# Patient Record
Sex: Female | Born: 1950 | Race: Black or African American | Hispanic: No | Smoking: Never smoker
Health system: Southern US, Community
[De-identification: ages and names within clinical notes are randomized; demographics above are authoritative.]

## PROBLEM LIST (undated history)

## (undated) DIAGNOSIS — E669 Obesity, unspecified: Secondary | ICD-10-CM

## (undated) DIAGNOSIS — I5032 Chronic diastolic (congestive) heart failure: Secondary | ICD-10-CM

## (undated) DIAGNOSIS — M349 Systemic sclerosis, unspecified: Secondary | ICD-10-CM

## (undated) DIAGNOSIS — K219 Gastro-esophageal reflux disease without esophagitis: Secondary | ICD-10-CM

## (undated) DIAGNOSIS — M109 Gout, unspecified: Secondary | ICD-10-CM

## (undated) DIAGNOSIS — D649 Anemia, unspecified: Secondary | ICD-10-CM

## (undated) DIAGNOSIS — E119 Type 2 diabetes mellitus without complications: Secondary | ICD-10-CM

## (undated) DIAGNOSIS — Z9289 Personal history of other medical treatment: Secondary | ICD-10-CM

## (undated) DIAGNOSIS — A599 Trichomoniasis, unspecified: Secondary | ICD-10-CM

## (undated) DIAGNOSIS — M1990 Unspecified osteoarthritis, unspecified site: Secondary | ICD-10-CM

## (undated) DIAGNOSIS — IMO0002 Reserved for concepts with insufficient information to code with codable children: Secondary | ICD-10-CM

## (undated) DIAGNOSIS — K3184 Gastroparesis: Secondary | ICD-10-CM

## (undated) DIAGNOSIS — G629 Polyneuropathy, unspecified: Secondary | ICD-10-CM

## (undated) DIAGNOSIS — E039 Hypothyroidism, unspecified: Secondary | ICD-10-CM

## (undated) DIAGNOSIS — I499 Cardiac arrhythmia, unspecified: Secondary | ICD-10-CM

## (undated) DIAGNOSIS — I480 Paroxysmal atrial fibrillation: Secondary | ICD-10-CM

## (undated) DIAGNOSIS — K5521 Angiodysplasia of colon with hemorrhage: Secondary | ICD-10-CM

## (undated) DIAGNOSIS — N189 Chronic kidney disease, unspecified: Secondary | ICD-10-CM

## (undated) DIAGNOSIS — I10 Essential (primary) hypertension: Secondary | ICD-10-CM

## (undated) DIAGNOSIS — D573 Sickle-cell trait: Secondary | ICD-10-CM

## (undated) HISTORY — PX: COLONOSCOPY: SHX174

## (undated) HISTORY — DX: Essential (primary) hypertension: I10

## (undated) HISTORY — DX: Systemic sclerosis, unspecified: M34.9

## (undated) HISTORY — PX: TUBAL LIGATION: SHX77

## (undated) HISTORY — DX: Unspecified osteoarthritis, unspecified site: M19.90

## (undated) HISTORY — DX: Trichomoniasis, unspecified: A59.9

## (undated) HISTORY — DX: Obesity, unspecified: E66.9

## (undated) HISTORY — DX: Sickle-cell trait: D57.3

## (undated) HISTORY — DX: Reserved for concepts with insufficient information to code with codable children: IMO0002

---
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
## 1981-03-15 HISTORY — PX: ABDOMINAL HYSTERECTOMY: SHX81

## 1995-07-27 ENCOUNTER — Encounter: Payer: Self-pay | Admitting: Gastroenterology

## 1999-02-02 ENCOUNTER — Ambulatory Visit (HOSPITAL_COMMUNITY): Admission: RE | Admit: 1999-02-02 | Discharge: 1999-02-02 | Payer: Self-pay | Admitting: *Deleted

## 1999-03-03 ENCOUNTER — Ambulatory Visit (HOSPITAL_COMMUNITY): Admission: RE | Admit: 1999-03-03 | Discharge: 1999-03-03 | Payer: Self-pay | Admitting: *Deleted

## 1999-04-09 ENCOUNTER — Encounter: Admission: RE | Admit: 1999-04-09 | Discharge: 1999-04-09 | Payer: Self-pay | Admitting: Emergency Medicine

## 1999-04-09 ENCOUNTER — Encounter: Payer: Self-pay | Admitting: Emergency Medicine

## 1999-04-30 ENCOUNTER — Encounter: Admission: RE | Admit: 1999-04-30 | Discharge: 1999-04-30 | Payer: Self-pay | Admitting: Emergency Medicine

## 1999-04-30 ENCOUNTER — Encounter: Payer: Self-pay | Admitting: Emergency Medicine

## 1999-08-19 ENCOUNTER — Encounter: Payer: Self-pay | Admitting: Emergency Medicine

## 1999-08-19 ENCOUNTER — Encounter: Admission: RE | Admit: 1999-08-19 | Discharge: 1999-08-19 | Payer: Self-pay | Admitting: Emergency Medicine

## 2000-07-19 ENCOUNTER — Encounter: Payer: Self-pay | Admitting: Emergency Medicine

## 2000-07-19 ENCOUNTER — Encounter: Admission: RE | Admit: 2000-07-19 | Discharge: 2000-07-19 | Payer: Self-pay | Admitting: Emergency Medicine

## 2000-09-18 ENCOUNTER — Emergency Department (HOSPITAL_COMMUNITY): Admission: EM | Admit: 2000-09-18 | Discharge: 2000-09-18 | Payer: Self-pay | Admitting: Emergency Medicine

## 2000-09-18 ENCOUNTER — Encounter: Payer: Self-pay | Admitting: Emergency Medicine

## 2000-09-26 ENCOUNTER — Ambulatory Visit: Admission: RE | Admit: 2000-09-26 | Discharge: 2000-09-26 | Payer: Self-pay | Admitting: Pulmonary Disease

## 2000-10-07 ENCOUNTER — Encounter: Payer: Self-pay | Admitting: Emergency Medicine

## 2000-10-07 ENCOUNTER — Encounter: Admission: RE | Admit: 2000-10-07 | Discharge: 2000-10-07 | Payer: Self-pay | Admitting: Emergency Medicine

## 2000-10-09 ENCOUNTER — Encounter: Payer: Self-pay | Admitting: Emergency Medicine

## 2000-10-09 ENCOUNTER — Inpatient Hospital Stay (HOSPITAL_COMMUNITY): Admission: EM | Admit: 2000-10-09 | Discharge: 2000-10-14 | Payer: Self-pay | Admitting: *Deleted

## 2000-10-10 ENCOUNTER — Encounter: Payer: Self-pay | Admitting: Emergency Medicine

## 2000-10-11 ENCOUNTER — Encounter: Payer: Self-pay | Admitting: Family Medicine

## 2000-10-12 ENCOUNTER — Encounter: Payer: Self-pay | Admitting: Emergency Medicine

## 2000-10-13 ENCOUNTER — Encounter: Payer: Self-pay | Admitting: Emergency Medicine

## 2000-10-26 ENCOUNTER — Encounter: Payer: Self-pay | Admitting: Nephrology

## 2000-10-26 ENCOUNTER — Encounter: Admission: RE | Admit: 2000-10-26 | Discharge: 2000-10-26 | Payer: Self-pay | Admitting: Nephrology

## 2000-10-27 ENCOUNTER — Ambulatory Visit (HOSPITAL_COMMUNITY): Admission: RE | Admit: 2000-10-27 | Discharge: 2000-10-27 | Payer: Self-pay | Admitting: Pulmonary Disease

## 2000-10-27 ENCOUNTER — Encounter: Payer: Self-pay | Admitting: Pulmonary Disease

## 2000-11-13 HISTORY — PX: REPLACEMENT TOTAL KNEE: SUR1224

## 2000-12-01 ENCOUNTER — Inpatient Hospital Stay (HOSPITAL_COMMUNITY): Admission: RE | Admit: 2000-12-01 | Discharge: 2000-12-06 | Payer: Self-pay | Admitting: Orthopedic Surgery

## 2000-12-01 ENCOUNTER — Encounter: Payer: Self-pay | Admitting: Orthopedic Surgery

## 2001-02-07 ENCOUNTER — Encounter: Admission: RE | Admit: 2001-02-07 | Discharge: 2001-03-22 | Payer: Self-pay | Admitting: Orthopedic Surgery

## 2001-06-01 ENCOUNTER — Ambulatory Visit (HOSPITAL_COMMUNITY): Admission: RE | Admit: 2001-06-01 | Discharge: 2001-06-01 | Payer: Self-pay | Admitting: Family Medicine

## 2001-07-25 ENCOUNTER — Ambulatory Visit (HOSPITAL_COMMUNITY): Admission: RE | Admit: 2001-07-25 | Discharge: 2001-07-25 | Payer: Self-pay | Admitting: Family Medicine

## 2001-09-07 ENCOUNTER — Encounter: Payer: Self-pay | Admitting: Family Medicine

## 2001-09-07 ENCOUNTER — Ambulatory Visit (HOSPITAL_COMMUNITY): Admission: RE | Admit: 2001-09-07 | Discharge: 2001-09-07 | Payer: Self-pay | Admitting: Family Medicine

## 2001-10-23 ENCOUNTER — Encounter: Admission: RE | Admit: 2001-10-23 | Discharge: 2001-11-21 | Payer: Self-pay | Admitting: Family Medicine

## 2002-08-22 ENCOUNTER — Encounter (INDEPENDENT_AMBULATORY_CARE_PROVIDER_SITE_OTHER): Payer: Self-pay | Admitting: *Deleted

## 2002-12-06 ENCOUNTER — Other Ambulatory Visit: Admission: RE | Admit: 2002-12-06 | Discharge: 2002-12-06 | Payer: Self-pay | Admitting: Family Medicine

## 2002-12-12 ENCOUNTER — Ambulatory Visit (HOSPITAL_COMMUNITY): Admission: RE | Admit: 2002-12-12 | Discharge: 2002-12-12 | Payer: Self-pay | Admitting: Family Medicine

## 2003-01-21 ENCOUNTER — Ambulatory Visit (HOSPITAL_COMMUNITY): Admission: RE | Admit: 2003-01-21 | Discharge: 2003-01-21 | Payer: Self-pay | Admitting: Unknown Physician Specialty

## 2003-07-24 ENCOUNTER — Encounter: Admission: RE | Admit: 2003-07-24 | Discharge: 2003-07-24 | Payer: Self-pay | Admitting: *Deleted

## 2003-08-23 ENCOUNTER — Ambulatory Visit (HOSPITAL_COMMUNITY): Admission: RE | Admit: 2003-08-23 | Discharge: 2003-08-23 | Payer: Self-pay | Admitting: *Deleted

## 2003-08-23 ENCOUNTER — Encounter (INDEPENDENT_AMBULATORY_CARE_PROVIDER_SITE_OTHER): Payer: Self-pay | Admitting: *Deleted

## 2003-12-09 ENCOUNTER — Ambulatory Visit: Payer: Self-pay | Admitting: Family Medicine

## 2003-12-16 ENCOUNTER — Ambulatory Visit: Payer: Self-pay | Admitting: *Deleted

## 2004-01-07 ENCOUNTER — Ambulatory Visit: Payer: Self-pay | Admitting: Family Medicine

## 2004-02-25 ENCOUNTER — Ambulatory Visit: Payer: Self-pay | Admitting: Family Medicine

## 2004-03-02 ENCOUNTER — Ambulatory Visit: Payer: Self-pay | Admitting: Family Medicine

## 2004-03-26 ENCOUNTER — Ambulatory Visit: Payer: Self-pay | Admitting: Family Medicine

## 2004-03-30 ENCOUNTER — Encounter (INDEPENDENT_AMBULATORY_CARE_PROVIDER_SITE_OTHER): Payer: Self-pay | Admitting: *Deleted

## 2004-03-30 ENCOUNTER — Ambulatory Visit: Payer: Self-pay | Admitting: Pulmonary Disease

## 2004-04-10 ENCOUNTER — Ambulatory Visit: Payer: Self-pay | Admitting: Family Medicine

## 2004-04-20 ENCOUNTER — Ambulatory Visit: Payer: Self-pay | Admitting: Pulmonary Disease

## 2004-04-24 ENCOUNTER — Ambulatory Visit (HOSPITAL_COMMUNITY): Admission: RE | Admit: 2004-04-24 | Discharge: 2004-04-24 | Payer: Self-pay | Admitting: Cardiology

## 2004-04-24 ENCOUNTER — Ambulatory Visit: Payer: Self-pay | Admitting: Cardiovascular Disease

## 2004-04-24 HISTORY — PX: CARDIAC CATHETERIZATION: SHX172

## 2004-04-28 ENCOUNTER — Ambulatory Visit: Payer: Self-pay | Admitting: Family Medicine

## 2004-05-13 ENCOUNTER — Ambulatory Visit: Payer: Self-pay | Admitting: Pulmonary Disease

## 2004-05-18 ENCOUNTER — Ambulatory Visit: Payer: Self-pay | Admitting: Cardiology

## 2004-05-25 ENCOUNTER — Ambulatory Visit: Payer: Self-pay | Admitting: Cardiology

## 2004-06-01 ENCOUNTER — Ambulatory Visit: Payer: Self-pay | Admitting: *Deleted

## 2004-06-08 ENCOUNTER — Ambulatory Visit: Payer: Self-pay | Admitting: Cardiology

## 2004-06-10 ENCOUNTER — Ambulatory Visit: Payer: Self-pay | Admitting: Pulmonary Disease

## 2004-06-15 ENCOUNTER — Ambulatory Visit: Payer: Self-pay | Admitting: Cardiology

## 2004-06-29 ENCOUNTER — Ambulatory Visit: Payer: Self-pay | Admitting: *Deleted

## 2004-06-30 ENCOUNTER — Ambulatory Visit: Payer: Self-pay | Admitting: Family Medicine

## 2004-07-09 ENCOUNTER — Ambulatory Visit: Payer: Self-pay | Admitting: Family Medicine

## 2004-07-16 ENCOUNTER — Ambulatory Visit: Payer: Self-pay | Admitting: Family Medicine

## 2004-07-23 ENCOUNTER — Ambulatory Visit: Payer: Self-pay | Admitting: Family Medicine

## 2004-07-27 ENCOUNTER — Ambulatory Visit: Payer: Self-pay | Admitting: Pulmonary Disease

## 2004-07-27 ENCOUNTER — Ambulatory Visit: Payer: Self-pay | Admitting: Cardiology

## 2004-08-07 ENCOUNTER — Ambulatory Visit: Payer: Self-pay | Admitting: Family Medicine

## 2004-08-24 ENCOUNTER — Ambulatory Visit: Payer: Self-pay | Admitting: Cardiology

## 2004-08-25 ENCOUNTER — Ambulatory Visit: Payer: Self-pay

## 2004-08-25 ENCOUNTER — Ambulatory Visit: Payer: Self-pay | Admitting: Pulmonary Disease

## 2004-09-08 ENCOUNTER — Ambulatory Visit: Payer: Self-pay | Admitting: Pulmonary Disease

## 2004-09-23 ENCOUNTER — Ambulatory Visit: Payer: Self-pay | Admitting: Pulmonary Disease

## 2004-09-25 ENCOUNTER — Ambulatory Visit: Payer: Self-pay | Admitting: Cardiology

## 2004-10-23 ENCOUNTER — Ambulatory Visit: Payer: Self-pay | Admitting: Cardiology

## 2004-11-19 ENCOUNTER — Ambulatory Visit: Payer: Self-pay | Admitting: Family Medicine

## 2004-11-20 ENCOUNTER — Ambulatory Visit: Payer: Self-pay | Admitting: Cardiology

## 2004-11-25 ENCOUNTER — Ambulatory Visit: Payer: Self-pay | Admitting: Pulmonary Disease

## 2004-11-26 ENCOUNTER — Ambulatory Visit (HOSPITAL_COMMUNITY): Admission: RE | Admit: 2004-11-26 | Discharge: 2004-11-26 | Payer: Self-pay | Admitting: Family Medicine

## 2004-12-09 ENCOUNTER — Ambulatory Visit: Payer: Self-pay

## 2004-12-10 ENCOUNTER — Encounter: Admission: RE | Admit: 2004-12-10 | Discharge: 2005-03-10 | Payer: Self-pay | Admitting: Pulmonary Disease

## 2004-12-18 ENCOUNTER — Ambulatory Visit: Payer: Self-pay | Admitting: Internal Medicine

## 2005-01-13 ENCOUNTER — Ambulatory Visit: Payer: Self-pay | Admitting: Pulmonary Disease

## 2005-01-14 ENCOUNTER — Ambulatory Visit: Payer: Self-pay | Admitting: Cardiology

## 2005-01-18 ENCOUNTER — Ambulatory Visit: Payer: Self-pay | Admitting: Pulmonary Disease

## 2005-02-11 ENCOUNTER — Ambulatory Visit: Payer: Self-pay | Admitting: Cardiology

## 2005-02-16 ENCOUNTER — Ambulatory Visit: Payer: Self-pay | Admitting: Family Medicine

## 2005-02-25 ENCOUNTER — Ambulatory Visit: Payer: Self-pay | Admitting: *Deleted

## 2005-03-18 ENCOUNTER — Encounter: Admission: RE | Admit: 2005-03-18 | Discharge: 2005-06-16 | Payer: Self-pay | Admitting: Pulmonary Disease

## 2005-03-25 ENCOUNTER — Ambulatory Visit: Payer: Self-pay | Admitting: Cardiology

## 2005-04-01 ENCOUNTER — Ambulatory Visit: Payer: Self-pay | Admitting: Family Medicine

## 2005-04-12 ENCOUNTER — Encounter: Admission: RE | Admit: 2005-04-12 | Discharge: 2005-04-12 | Payer: Self-pay

## 2005-04-22 ENCOUNTER — Ambulatory Visit: Payer: Self-pay | Admitting: *Deleted

## 2005-04-22 ENCOUNTER — Encounter: Admission: RE | Admit: 2005-04-22 | Discharge: 2005-04-22 | Payer: Self-pay

## 2005-04-27 ENCOUNTER — Ambulatory Visit: Payer: Self-pay | Admitting: Pulmonary Disease

## 2005-05-03 ENCOUNTER — Ambulatory Visit: Payer: Self-pay | Admitting: Pulmonary Disease

## 2005-05-04 ENCOUNTER — Ambulatory Visit: Payer: Self-pay | Admitting: Family Medicine

## 2005-05-07 ENCOUNTER — Ambulatory Visit: Payer: Self-pay

## 2005-05-07 ENCOUNTER — Encounter: Payer: Self-pay | Admitting: Cardiology

## 2005-05-13 ENCOUNTER — Ambulatory Visit: Payer: Self-pay | Admitting: Cardiology

## 2005-05-14 ENCOUNTER — Ambulatory Visit (HOSPITAL_BASED_OUTPATIENT_CLINIC_OR_DEPARTMENT_OTHER): Admission: RE | Admit: 2005-05-14 | Discharge: 2005-05-14 | Payer: Self-pay | Admitting: Pulmonary Disease

## 2005-05-15 ENCOUNTER — Ambulatory Visit: Payer: Self-pay | Admitting: Pulmonary Disease

## 2005-05-19 ENCOUNTER — Encounter: Admission: RE | Admit: 2005-05-19 | Discharge: 2005-05-19 | Payer: Self-pay

## 2005-05-21 ENCOUNTER — Ambulatory Visit (HOSPITAL_COMMUNITY): Admission: RE | Admit: 2005-05-21 | Discharge: 2005-05-21 | Payer: Self-pay | Admitting: Family Medicine

## 2005-05-21 ENCOUNTER — Ambulatory Visit: Payer: Self-pay | Admitting: Family Medicine

## 2005-06-04 ENCOUNTER — Ambulatory Visit: Payer: Self-pay | Admitting: Pulmonary Disease

## 2005-06-10 ENCOUNTER — Ambulatory Visit: Payer: Self-pay | Admitting: Cardiology

## 2005-06-28 ENCOUNTER — Ambulatory Visit: Payer: Self-pay | Admitting: Family Medicine

## 2005-07-01 ENCOUNTER — Ambulatory Visit: Payer: Self-pay | Admitting: Pulmonary Disease

## 2005-07-01 ENCOUNTER — Ambulatory Visit (HOSPITAL_COMMUNITY): Admission: RE | Admit: 2005-07-01 | Discharge: 2005-07-01 | Payer: Self-pay | Admitting: Family Medicine

## 2005-07-08 ENCOUNTER — Ambulatory Visit: Payer: Self-pay | Admitting: Cardiology

## 2005-07-29 ENCOUNTER — Ambulatory Visit: Payer: Self-pay | Admitting: Family Medicine

## 2005-08-05 ENCOUNTER — Ambulatory Visit: Payer: Self-pay | Admitting: Cardiology

## 2005-08-10 ENCOUNTER — Ambulatory Visit: Payer: Self-pay | Admitting: Pulmonary Disease

## 2005-08-19 ENCOUNTER — Ambulatory Visit: Payer: Self-pay | Admitting: Internal Medicine

## 2005-08-24 ENCOUNTER — Ambulatory Visit: Payer: Self-pay | Admitting: Family Medicine

## 2005-08-27 ENCOUNTER — Ambulatory Visit: Payer: Self-pay | Admitting: Pulmonary Disease

## 2005-09-09 ENCOUNTER — Ambulatory Visit: Payer: Self-pay

## 2005-09-09 ENCOUNTER — Encounter: Payer: Self-pay | Admitting: Cardiology

## 2005-09-09 ENCOUNTER — Ambulatory Visit: Payer: Self-pay | Admitting: Internal Medicine

## 2005-09-30 ENCOUNTER — Ambulatory Visit: Payer: Self-pay | Admitting: Cardiology

## 2005-10-08 ENCOUNTER — Ambulatory Visit: Payer: Self-pay | Admitting: Pulmonary Disease

## 2005-10-28 ENCOUNTER — Ambulatory Visit: Payer: Self-pay | Admitting: Cardiology

## 2005-11-02 ENCOUNTER — Ambulatory Visit: Payer: Self-pay | Admitting: Pulmonary Disease

## 2005-11-11 ENCOUNTER — Ambulatory Visit: Payer: Self-pay | Admitting: Cardiology

## 2005-11-26 ENCOUNTER — Ambulatory Visit: Payer: Self-pay | Admitting: Pulmonary Disease

## 2005-11-26 ENCOUNTER — Ambulatory Visit: Payer: Self-pay | Admitting: Cardiology

## 2005-11-26 ENCOUNTER — Ambulatory Visit: Payer: Self-pay | Admitting: Emergency Medicine

## 2005-12-13 ENCOUNTER — Ambulatory Visit: Payer: Self-pay | Admitting: Cardiology

## 2005-12-24 ENCOUNTER — Ambulatory Visit: Payer: Self-pay | Admitting: Cardiology

## 2006-01-04 ENCOUNTER — Encounter: Admission: RE | Admit: 2006-01-04 | Discharge: 2006-01-04 | Payer: Self-pay | Admitting: *Deleted

## 2006-01-06 ENCOUNTER — Ambulatory Visit (HOSPITAL_COMMUNITY): Admission: RE | Admit: 2006-01-06 | Discharge: 2006-01-06 | Payer: Self-pay | Admitting: *Deleted

## 2006-01-06 ENCOUNTER — Encounter (INDEPENDENT_AMBULATORY_CARE_PROVIDER_SITE_OTHER): Payer: Self-pay | Admitting: Specialist

## 2006-01-06 ENCOUNTER — Encounter: Payer: Self-pay | Admitting: Gastroenterology

## 2006-01-13 ENCOUNTER — Ambulatory Visit: Payer: Self-pay | Admitting: *Deleted

## 2006-01-13 ENCOUNTER — Ambulatory Visit: Payer: Self-pay | Admitting: Pulmonary Disease

## 2006-01-14 ENCOUNTER — Ambulatory Visit: Payer: Self-pay | Admitting: Family Medicine

## 2006-01-15 ENCOUNTER — Emergency Department (HOSPITAL_COMMUNITY): Admission: EM | Admit: 2006-01-15 | Discharge: 2006-01-16 | Payer: Self-pay | Admitting: *Deleted

## 2006-01-19 ENCOUNTER — Ambulatory Visit (HOSPITAL_COMMUNITY): Admission: RE | Admit: 2006-01-19 | Discharge: 2006-01-19 | Payer: Self-pay | Admitting: Family Medicine

## 2006-01-28 ENCOUNTER — Ambulatory Visit: Payer: Self-pay | Admitting: Cardiovascular Disease

## 2006-02-08 ENCOUNTER — Ambulatory Visit: Payer: Self-pay | Admitting: Emergency Medicine

## 2006-02-15 ENCOUNTER — Encounter: Payer: Self-pay | Admitting: Internal Medicine

## 2006-02-15 ENCOUNTER — Ambulatory Visit: Payer: Self-pay

## 2006-02-17 ENCOUNTER — Ambulatory Visit: Payer: Self-pay | Admitting: Pulmonary Disease

## 2006-02-17 ENCOUNTER — Ambulatory Visit: Payer: Self-pay | Admitting: Emergency Medicine

## 2006-02-17 LAB — CONVERTED CEMR LAB
ALT: 34 units/L (ref 0–40)
AST: 34 units/L (ref 0–37)
Albumin: 3.4 g/dL — ABNORMAL LOW (ref 3.5–5.2)
Bilirubin, Direct: 0.1 mg/dL (ref 0.0–0.3)
HCT: 36.4 % (ref 36.0–46.0)
MCHC: 32.4 g/dL (ref 30.0–36.0)
MCV: 89.9 fL (ref 78.0–100.0)
Total Bilirubin: 0.8 mg/dL (ref 0.3–1.2)
WBC: 6.9 10*3/uL (ref 4.5–10.5)

## 2006-02-23 ENCOUNTER — Ambulatory Visit: Payer: Self-pay | Admitting: Emergency Medicine

## 2006-03-10 ENCOUNTER — Ambulatory Visit (HOSPITAL_COMMUNITY): Admission: RE | Admit: 2006-03-10 | Discharge: 2006-03-10 | Payer: Self-pay | Admitting: *Deleted

## 2006-03-10 ENCOUNTER — Encounter: Payer: Self-pay | Admitting: Gastroenterology

## 2006-03-10 ENCOUNTER — Encounter (INDEPENDENT_AMBULATORY_CARE_PROVIDER_SITE_OTHER): Payer: Self-pay | Admitting: *Deleted

## 2006-04-01 ENCOUNTER — Ambulatory Visit: Payer: Self-pay | Admitting: Family Medicine

## 2006-04-04 ENCOUNTER — Ambulatory Visit: Payer: Self-pay | Admitting: Emergency Medicine

## 2006-04-04 ENCOUNTER — Inpatient Hospital Stay (HOSPITAL_COMMUNITY): Admission: AD | Admit: 2006-04-04 | Discharge: 2006-04-08 | Payer: Self-pay | Admitting: Emergency Medicine

## 2006-04-04 LAB — CONVERTED CEMR LAB
AST: 29 units/L (ref 0–37)
Albumin: 3.3 g/dL — ABNORMAL LOW (ref 3.5–5.2)
BUN: 17 mg/dL (ref 6–23)
CO2: 24 meq/L (ref 19–32)
Chloride: 94 meq/L — ABNORMAL LOW (ref 96–112)
Creatinine, Ser: 1.3 mg/dL — ABNORMAL HIGH (ref 0.4–1.2)
GFR calc Af Amer: 55 mL/min
GFR calc non Af Amer: 45 mL/min
Total Bilirubin: 1.5 mg/dL — ABNORMAL HIGH (ref 0.3–1.2)

## 2006-04-12 ENCOUNTER — Ambulatory Visit: Payer: Self-pay | Admitting: Cardiology

## 2006-04-13 ENCOUNTER — Ambulatory Visit: Payer: Self-pay | Admitting: Family Medicine

## 2006-04-19 ENCOUNTER — Encounter: Admission: RE | Admit: 2006-04-19 | Discharge: 2006-07-18 | Payer: Self-pay | Admitting: Emergency Medicine

## 2006-04-21 ENCOUNTER — Ambulatory Visit: Payer: Self-pay | Admitting: Emergency Medicine

## 2006-04-22 ENCOUNTER — Ambulatory Visit: Payer: Self-pay | Admitting: *Deleted

## 2006-05-13 ENCOUNTER — Ambulatory Visit: Payer: Self-pay | Admitting: Family Medicine

## 2006-05-17 ENCOUNTER — Ambulatory Visit (HOSPITAL_COMMUNITY): Admission: RE | Admit: 2006-05-17 | Discharge: 2006-05-17 | Payer: Self-pay | Admitting: Orthopedic Surgery

## 2006-05-17 ENCOUNTER — Ambulatory Visit: Payer: Self-pay | Admitting: Vascular Surgery

## 2006-05-17 ENCOUNTER — Ambulatory Visit: Payer: Self-pay | Admitting: Cardiology

## 2006-05-23 ENCOUNTER — Ambulatory Visit: Payer: Self-pay | Admitting: Emergency Medicine

## 2006-05-23 LAB — CONVERTED CEMR LAB
AST: 21 units/L (ref 0–37)
Basophils Relative: 2.4 % — ABNORMAL HIGH (ref 0.0–1.0)
Bilirubin, Direct: 0.1 mg/dL (ref 0.0–0.3)
Eosinophils Relative: 1.3 % (ref 0.0–5.0)
HCT: 36.1 % (ref 36.0–46.0)
Hemoglobin: 12 g/dL (ref 12.0–15.0)
Lymphocytes Relative: 30.8 % (ref 12.0–46.0)
Monocytes Absolute: 0.7 10*3/uL (ref 0.2–0.7)
Monocytes Relative: 9 % (ref 3.0–11.0)
Neutro Abs: 4.4 10*3/uL (ref 1.4–7.7)
Neutrophils Relative %: 56.5 % (ref 43.0–77.0)
RDW: 12.7 % (ref 11.5–14.6)
Total Bilirubin: 0.5 mg/dL (ref 0.3–1.2)
Total Protein: 6.8 g/dL (ref 6.0–8.3)

## 2006-06-07 ENCOUNTER — Ambulatory Visit: Payer: Self-pay | Admitting: Cardiology

## 2006-06-13 ENCOUNTER — Encounter: Admission: RE | Admit: 2006-06-13 | Discharge: 2006-06-13 | Payer: Self-pay | Admitting: Orthopedic Surgery

## 2006-07-05 ENCOUNTER — Ambulatory Visit: Payer: Self-pay | Admitting: Cardiology

## 2006-07-12 ENCOUNTER — Ambulatory Visit: Payer: Self-pay | Admitting: Family Medicine

## 2006-07-26 ENCOUNTER — Ambulatory Visit: Payer: Self-pay | Admitting: Emergency Medicine

## 2006-08-02 ENCOUNTER — Ambulatory Visit: Payer: Self-pay | Admitting: Emergency Medicine

## 2006-08-02 ENCOUNTER — Ambulatory Visit: Payer: Self-pay | Admitting: Cardiology

## 2006-08-02 LAB — CONVERTED CEMR LAB
Albumin: 3.6 g/dL (ref 3.5–5.2)
Alkaline Phosphatase: 59 units/L (ref 39–117)
Total Bilirubin: 0.7 mg/dL (ref 0.3–1.2)

## 2006-08-05 ENCOUNTER — Ambulatory Visit: Payer: Self-pay | Admitting: Emergency Medicine

## 2006-08-09 DIAGNOSIS — M349 Systemic sclerosis, unspecified: Secondary | ICD-10-CM

## 2006-08-09 DIAGNOSIS — I10 Essential (primary) hypertension: Secondary | ICD-10-CM

## 2006-08-09 DIAGNOSIS — K219 Gastro-esophageal reflux disease without esophagitis: Secondary | ICD-10-CM

## 2006-08-09 DIAGNOSIS — M5137 Other intervertebral disc degeneration, lumbosacral region: Secondary | ICD-10-CM

## 2006-08-09 DIAGNOSIS — E1142 Type 2 diabetes mellitus with diabetic polyneuropathy: Secondary | ICD-10-CM

## 2006-08-15 ENCOUNTER — Encounter: Payer: Self-pay | Admitting: Emergency Medicine

## 2006-08-15 ENCOUNTER — Ambulatory Visit: Payer: Self-pay

## 2006-08-19 ENCOUNTER — Ambulatory Visit: Payer: Self-pay | Admitting: Cardiology

## 2006-08-23 ENCOUNTER — Encounter: Admission: RE | Admit: 2006-08-23 | Discharge: 2006-08-23 | Payer: Self-pay | Admitting: Emergency Medicine

## 2006-09-05 ENCOUNTER — Ambulatory Visit: Payer: Self-pay | Admitting: Emergency Medicine

## 2006-10-25 ENCOUNTER — Ambulatory Visit: Payer: Self-pay | Admitting: Cardiology

## 2006-11-04 ENCOUNTER — Ambulatory Visit: Payer: Self-pay | Admitting: Emergency Medicine

## 2006-11-04 LAB — CONVERTED CEMR LAB
ALT: 19 units/L (ref 0–35)
Albumin: 3.5 g/dL (ref 3.5–5.2)
Alkaline Phosphatase: 64 units/L (ref 39–117)
Total Bilirubin: 0.6 mg/dL (ref 0.3–1.2)
Total Protein: 7.5 g/dL (ref 6.0–8.3)

## 2006-11-10 ENCOUNTER — Ambulatory Visit: Payer: Self-pay

## 2006-11-22 ENCOUNTER — Ambulatory Visit: Payer: Self-pay | Admitting: Cardiology

## 2006-11-24 ENCOUNTER — Encounter: Admission: RE | Admit: 2006-11-24 | Discharge: 2006-11-24 | Payer: Self-pay | Admitting: Emergency Medicine

## 2006-12-07 ENCOUNTER — Ambulatory Visit: Payer: Self-pay | Admitting: Emergency Medicine

## 2006-12-07 LAB — CONVERTED CEMR LAB
Alkaline Phosphatase: 65 units/L (ref 39–117)
Bilirubin, Direct: 0.1 mg/dL (ref 0.0–0.3)
Total Bilirubin: 0.6 mg/dL (ref 0.3–1.2)
Total Protein: 7.4 g/dL (ref 6.0–8.3)

## 2006-12-13 ENCOUNTER — Ambulatory Visit: Payer: Self-pay | Admitting: Family Medicine

## 2006-12-22 ENCOUNTER — Ambulatory Visit: Payer: Self-pay | Admitting: Cardiology

## 2006-12-22 LAB — CONVERTED CEMR LAB
ALT: 22 units/L (ref 0–35)
Alkaline Phosphatase: 52 units/L (ref 39–117)
Bilirubin, Direct: 0.1 mg/dL (ref 0.0–0.3)
Total Protein: 7.3 g/dL (ref 6.0–8.3)

## 2006-12-30 ENCOUNTER — Ambulatory Visit: Payer: Self-pay | Admitting: Emergency Medicine

## 2006-12-31 DIAGNOSIS — IMO0002 Reserved for concepts with insufficient information to code with codable children: Secondary | ICD-10-CM | POA: Insufficient documentation

## 2006-12-31 DIAGNOSIS — R059 Cough, unspecified: Secondary | ICD-10-CM | POA: Insufficient documentation

## 2006-12-31 DIAGNOSIS — R05 Cough: Secondary | ICD-10-CM

## 2006-12-31 DIAGNOSIS — J309 Allergic rhinitis, unspecified: Secondary | ICD-10-CM | POA: Insufficient documentation

## 2007-01-13 ENCOUNTER — Ambulatory Visit: Payer: Self-pay | Admitting: Emergency Medicine

## 2007-01-19 ENCOUNTER — Ambulatory Visit: Payer: Self-pay | Admitting: Cardiology

## 2007-01-19 LAB — CONVERTED CEMR LAB
ALT: 22 units/L (ref 0–35)
AST: 26 units/L (ref 0–37)
Alkaline Phosphatase: 51 units/L (ref 39–117)
Total Protein: 7.3 g/dL (ref 6.0–8.3)

## 2007-02-01 ENCOUNTER — Telehealth (INDEPENDENT_AMBULATORY_CARE_PROVIDER_SITE_OTHER): Payer: Self-pay | Admitting: *Deleted

## 2007-02-07 ENCOUNTER — Ambulatory Visit: Payer: Self-pay | Admitting: Emergency Medicine

## 2007-02-17 ENCOUNTER — Ambulatory Visit: Payer: Self-pay | Admitting: Cardiology

## 2007-02-17 LAB — CONVERTED CEMR LAB
ALT: 23 units/L (ref 0–35)
AST: 25 units/L (ref 0–37)
Bilirubin, Direct: 0.1 mg/dL (ref 0.0–0.3)
Total Protein: 7 g/dL (ref 6.0–8.3)

## 2007-02-20 ENCOUNTER — Telehealth (INDEPENDENT_AMBULATORY_CARE_PROVIDER_SITE_OTHER): Payer: Self-pay | Admitting: *Deleted

## 2007-02-22 ENCOUNTER — Ambulatory Visit (HOSPITAL_COMMUNITY): Admission: RE | Admit: 2007-02-22 | Discharge: 2007-02-22 | Payer: Self-pay | Admitting: Family Medicine

## 2007-02-24 ENCOUNTER — Encounter: Admission: RE | Admit: 2007-02-24 | Discharge: 2007-02-25 | Payer: Self-pay | Admitting: Emergency Medicine

## 2007-03-20 ENCOUNTER — Ambulatory Visit: Payer: Self-pay | Admitting: Cardiology

## 2007-03-20 LAB — CONVERTED CEMR LAB
Albumin: 3.5 g/dL (ref 3.5–5.2)
Bilirubin, Direct: 0.1 mg/dL (ref 0.0–0.3)

## 2007-03-22 ENCOUNTER — Telehealth (INDEPENDENT_AMBULATORY_CARE_PROVIDER_SITE_OTHER): Payer: Self-pay | Admitting: *Deleted

## 2007-04-07 ENCOUNTER — Ambulatory Visit: Payer: Self-pay | Admitting: Internal Medicine

## 2007-04-07 LAB — CONVERTED CEMR LAB
BUN: 20 mg/dL (ref 6–23)
CO2: 23 meq/L (ref 19–32)
Chloride: 100 meq/L (ref 96–112)
Creatinine, Ser: 1 mg/dL (ref 0.4–1.2)
Digitoxin Lvl: 0.2 ng/mL — ABNORMAL LOW (ref 0.8–2.0)
Direct LDL: 43 mg/dL
Magnesium: 0.9 mg/dL — CL (ref 1.5–2.5)
TSH: 1.03 microintl units/mL (ref 0.35–5.50)

## 2007-04-17 ENCOUNTER — Ambulatory Visit: Payer: Self-pay | Admitting: Internal Medicine

## 2007-04-17 LAB — CONVERTED CEMR LAB: Albumin: 3.4 g/dL — ABNORMAL LOW (ref 3.5–5.2)

## 2007-04-21 ENCOUNTER — Ambulatory Visit: Payer: Self-pay | Admitting: Internal Medicine

## 2007-04-21 LAB — CONVERTED CEMR LAB
BUN: 21 mg/dL (ref 6–23)
CO2: 23 meq/L (ref 19–32)
Calcium: 9.7 mg/dL (ref 8.4–10.5)
Chloride: 103 meq/L (ref 96–112)
Creatinine, Ser: 1 mg/dL (ref 0.4–1.2)
GFR calc Af Amer: 74 mL/min

## 2007-04-24 ENCOUNTER — Ambulatory Visit: Payer: Self-pay | Admitting: Family Medicine

## 2007-04-24 LAB — CONVERTED CEMR LAB
BUN: 27 mg/dL — ABNORMAL HIGH (ref 6–23)
CO2: 21 meq/L (ref 19–32)
Chloride: 104 meq/L (ref 96–112)
LDL Cholesterol: 14 mg/dL (ref 0–99)
Potassium: 4.5 meq/L (ref 3.5–5.3)
Triglycerides: 345 mg/dL — ABNORMAL HIGH (ref <150)
VLDL: 69 mg/dL — ABNORMAL HIGH (ref 0–40)

## 2007-05-17 ENCOUNTER — Ambulatory Visit: Payer: Self-pay | Admitting: Emergency Medicine

## 2007-06-06 ENCOUNTER — Ambulatory Visit: Payer: Self-pay | Admitting: Family Medicine

## 2007-06-09 ENCOUNTER — Telehealth (INDEPENDENT_AMBULATORY_CARE_PROVIDER_SITE_OTHER): Payer: Self-pay | Admitting: *Deleted

## 2007-06-09 ENCOUNTER — Ambulatory Visit: Payer: Self-pay | Admitting: Emergency Medicine

## 2007-06-09 DIAGNOSIS — J209 Acute bronchitis, unspecified: Secondary | ICD-10-CM | POA: Insufficient documentation

## 2007-06-15 ENCOUNTER — Telehealth (INDEPENDENT_AMBULATORY_CARE_PROVIDER_SITE_OTHER): Payer: Self-pay

## 2007-06-16 ENCOUNTER — Ambulatory Visit: Payer: Self-pay | Admitting: Emergency Medicine

## 2007-06-19 ENCOUNTER — Ambulatory Visit: Payer: Self-pay | Admitting: Family Medicine

## 2007-06-19 LAB — CONVERTED CEMR LAB
LDL Cholesterol: 21 mg/dL (ref 0–99)
Triglycerides: 254 mg/dL — ABNORMAL HIGH (ref <150)

## 2007-06-21 ENCOUNTER — Encounter (INDEPENDENT_AMBULATORY_CARE_PROVIDER_SITE_OTHER): Payer: Self-pay | Admitting: *Deleted

## 2007-06-22 ENCOUNTER — Ambulatory Visit: Payer: Self-pay | Admitting: Emergency Medicine

## 2007-07-04 ENCOUNTER — Encounter: Admission: RE | Admit: 2007-07-04 | Discharge: 2007-07-04 | Payer: Self-pay | Admitting: Emergency Medicine

## 2007-07-04 ENCOUNTER — Ambulatory Visit: Payer: Self-pay | Admitting: Cardiology

## 2007-07-04 LAB — CONVERTED CEMR LAB
ALT: 24 units/L (ref 0–35)
AST: 28 units/L (ref 0–37)
Albumin: 3.5 g/dL (ref 3.5–5.2)
Alkaline Phosphatase: 79 units/L (ref 39–117)
Bilirubin, Direct: 0.1 mg/dL (ref 0.0–0.3)
Total Bilirubin: 0.6 mg/dL (ref 0.3–1.2)
Total Protein: 7.7 g/dL (ref 6.0–8.3)

## 2007-07-07 ENCOUNTER — Encounter: Payer: Self-pay | Admitting: Emergency Medicine

## 2007-08-01 ENCOUNTER — Ambulatory Visit: Payer: Self-pay | Admitting: Cardiology

## 2007-08-01 LAB — CONVERTED CEMR LAB
ALT: 22 units/L (ref 0–35)
AST: 24 units/L (ref 0–37)
Albumin: 3.5 g/dL (ref 3.5–5.2)

## 2007-08-08 ENCOUNTER — Telehealth (INDEPENDENT_AMBULATORY_CARE_PROVIDER_SITE_OTHER): Payer: Self-pay | Admitting: *Deleted

## 2007-08-09 ENCOUNTER — Ambulatory Visit: Payer: Self-pay | Admitting: Emergency Medicine

## 2007-08-14 ENCOUNTER — Telehealth (INDEPENDENT_AMBULATORY_CARE_PROVIDER_SITE_OTHER): Payer: Self-pay | Admitting: *Deleted

## 2007-08-29 ENCOUNTER — Encounter: Payer: Self-pay | Admitting: Emergency Medicine

## 2007-08-29 ENCOUNTER — Ambulatory Visit: Payer: Self-pay | Admitting: Cardiology

## 2007-08-29 LAB — CONVERTED CEMR LAB: Total Bilirubin: 0.6 mg/dL (ref 0.3–1.2)

## 2007-09-25 ENCOUNTER — Ambulatory Visit: Payer: Self-pay

## 2007-09-25 ENCOUNTER — Encounter: Payer: Self-pay | Admitting: Emergency Medicine

## 2007-09-25 ENCOUNTER — Ambulatory Visit: Payer: Self-pay | Admitting: Emergency Medicine

## 2007-09-25 DIAGNOSIS — J9620 Acute and chronic respiratory failure, unspecified whether with hypoxia or hypercapnia: Secondary | ICD-10-CM

## 2007-09-26 ENCOUNTER — Ambulatory Visit: Payer: Self-pay | Admitting: Internal Medicine

## 2007-09-26 ENCOUNTER — Encounter: Payer: Self-pay | Admitting: Emergency Medicine

## 2007-09-26 LAB — CONVERTED CEMR LAB
ALT: 22 units/L (ref 0–35)
AST: 30 units/L (ref 0–37)
Alkaline Phosphatase: 70 units/L (ref 39–117)
Bilirubin, Direct: 0.1 mg/dL (ref 0.0–0.3)
Total Bilirubin: 0.8 mg/dL (ref 0.3–1.2)

## 2007-09-28 ENCOUNTER — Ambulatory Visit: Payer: Self-pay | Admitting: Internal Medicine

## 2007-09-28 ENCOUNTER — Encounter (INDEPENDENT_AMBULATORY_CARE_PROVIDER_SITE_OTHER): Payer: Self-pay | Admitting: Family Medicine

## 2007-10-02 ENCOUNTER — Ambulatory Visit (HOSPITAL_COMMUNITY): Admission: RE | Admit: 2007-10-02 | Discharge: 2007-10-02 | Payer: Self-pay | Admitting: Family Medicine

## 2007-10-09 ENCOUNTER — Ambulatory Visit: Payer: Self-pay | Admitting: Cardiology

## 2007-10-09 ENCOUNTER — Ambulatory Visit: Payer: Self-pay | Admitting: Emergency Medicine

## 2007-11-01 ENCOUNTER — Ambulatory Visit (HOSPITAL_COMMUNITY): Admission: RE | Admit: 2007-11-01 | Discharge: 2007-11-01 | Payer: Self-pay | Admitting: Family Medicine

## 2007-11-02 ENCOUNTER — Ambulatory Visit: Payer: Self-pay | Admitting: Internal Medicine

## 2007-11-02 LAB — CONVERTED CEMR LAB
ALT: 20 units/L (ref 0–35)
Alkaline Phosphatase: 70 units/L (ref 39–117)
Bilirubin, Direct: 0.1 mg/dL (ref 0.0–0.3)
Total Protein: 7.3 g/dL (ref 6.0–8.3)

## 2007-11-17 ENCOUNTER — Telehealth (INDEPENDENT_AMBULATORY_CARE_PROVIDER_SITE_OTHER): Payer: Self-pay | Admitting: *Deleted

## 2007-11-30 ENCOUNTER — Ambulatory Visit: Payer: Self-pay | Admitting: Cardiology

## 2007-11-30 LAB — CONVERTED CEMR LAB
Albumin: 3.6 g/dL (ref 3.5–5.2)
Total Protein: 7.4 g/dL (ref 6.0–8.3)

## 2007-12-28 ENCOUNTER — Ambulatory Visit: Payer: Self-pay | Admitting: Emergency Medicine

## 2008-01-17 ENCOUNTER — Telehealth: Payer: Self-pay | Admitting: Emergency Medicine

## 2008-02-05 ENCOUNTER — Ambulatory Visit: Payer: Self-pay | Admitting: Emergency Medicine

## 2008-02-07 ENCOUNTER — Telehealth: Payer: Self-pay | Admitting: Emergency Medicine

## 2008-02-07 ENCOUNTER — Encounter: Payer: Self-pay | Admitting: Emergency Medicine

## 2008-02-14 ENCOUNTER — Ambulatory Visit: Payer: Self-pay | Admitting: Internal Medicine

## 2008-02-14 LAB — CONVERTED CEMR LAB
Albumin: 3.3 g/dL — ABNORMAL LOW (ref 3.5–5.2)
Alkaline Phosphatase: 66 units/L (ref 39–117)
Bilirubin, Direct: 0.1 mg/dL (ref 0.0–0.3)

## 2008-02-29 ENCOUNTER — Ambulatory Visit: Payer: Self-pay | Admitting: Emergency Medicine

## 2008-03-04 ENCOUNTER — Encounter: Admission: RE | Admit: 2008-03-04 | Discharge: 2008-03-04 | Payer: Self-pay | Admitting: *Deleted

## 2008-03-18 ENCOUNTER — Ambulatory Visit: Payer: Self-pay | Admitting: Internal Medicine

## 2008-03-18 LAB — CONVERTED CEMR LAB
AST: 30 units/L (ref 0–37)
Albumin: 3.4 g/dL — ABNORMAL LOW (ref 3.5–5.2)
Alkaline Phosphatase: 73 units/L (ref 39–117)
Bilirubin, Direct: 0.1 mg/dL (ref 0.0–0.3)
Total Protein: 6.9 g/dL (ref 6.0–8.3)

## 2008-03-27 ENCOUNTER — Ambulatory Visit: Payer: Self-pay | Admitting: Emergency Medicine

## 2008-04-05 ENCOUNTER — Telehealth (INDEPENDENT_AMBULATORY_CARE_PROVIDER_SITE_OTHER): Payer: Self-pay | Admitting: *Deleted

## 2008-04-09 ENCOUNTER — Telehealth: Payer: Self-pay | Admitting: Emergency Medicine

## 2008-04-11 ENCOUNTER — Encounter: Payer: Self-pay | Admitting: Emergency Medicine

## 2008-04-16 ENCOUNTER — Encounter: Payer: Self-pay | Admitting: Gastroenterology

## 2008-04-23 ENCOUNTER — Ambulatory Visit (HOSPITAL_COMMUNITY): Admission: RE | Admit: 2008-04-23 | Discharge: 2008-04-23 | Payer: Self-pay | Admitting: Family Medicine

## 2008-04-25 ENCOUNTER — Ambulatory Visit: Payer: Self-pay | Admitting: Cardiovascular Disease

## 2008-05-09 LAB — CONVERTED CEMR LAB
AST: 29 units/L (ref 0–37)
Albumin: 3.3 g/dL — ABNORMAL LOW (ref 3.5–5.2)
Total Protein: 6.7 g/dL (ref 6.0–8.3)

## 2008-05-10 ENCOUNTER — Ambulatory Visit: Payer: Self-pay | Admitting: Internal Medicine

## 2008-05-14 ENCOUNTER — Ambulatory Visit: Payer: Self-pay | Admitting: Emergency Medicine

## 2008-05-23 ENCOUNTER — Ambulatory Visit: Payer: Self-pay | Admitting: Cardiovascular Disease

## 2008-05-23 LAB — CONVERTED CEMR LAB
AST: 29 units/L (ref 0–37)
Total Bilirubin: 0.8 mg/dL (ref 0.3–1.2)

## 2008-06-06 ENCOUNTER — Encounter: Payer: Self-pay | Admitting: Emergency Medicine

## 2008-06-07 ENCOUNTER — Telehealth: Payer: Self-pay | Admitting: Emergency Medicine

## 2008-06-18 ENCOUNTER — Encounter: Payer: Self-pay | Admitting: Emergency Medicine

## 2008-06-19 ENCOUNTER — Telehealth (INDEPENDENT_AMBULATORY_CARE_PROVIDER_SITE_OTHER): Payer: Self-pay | Admitting: *Deleted

## 2008-06-20 ENCOUNTER — Ambulatory Visit: Payer: Self-pay | Admitting: Internal Medicine

## 2008-06-20 LAB — CONVERTED CEMR LAB
ALT: 21 units/L (ref 0–35)
AST: 26 units/L (ref 0–37)
Alkaline Phosphatase: 78 units/L (ref 39–117)
Bilirubin, Direct: 0 mg/dL (ref 0.0–0.3)
Total Bilirubin: 0.6 mg/dL (ref 0.3–1.2)

## 2008-06-27 ENCOUNTER — Ambulatory Visit: Payer: Self-pay | Admitting: Emergency Medicine

## 2008-07-04 ENCOUNTER — Encounter: Payer: Self-pay | Admitting: Gastroenterology

## 2008-07-05 ENCOUNTER — Encounter: Payer: Self-pay | Admitting: Emergency Medicine

## 2008-07-15 ENCOUNTER — Encounter: Payer: Self-pay | Admitting: Emergency Medicine

## 2008-07-18 ENCOUNTER — Ambulatory Visit: Payer: Self-pay | Admitting: Internal Medicine

## 2008-07-19 LAB — CONVERTED CEMR LAB
ALT: 19 units/L (ref 0–35)
Albumin: 3.4 g/dL — ABNORMAL LOW (ref 3.5–5.2)
Total Bilirubin: 0.7 mg/dL (ref 0.3–1.2)
Total Protein: 7.4 g/dL (ref 6.0–8.3)

## 2008-08-13 ENCOUNTER — Encounter: Payer: Self-pay | Admitting: *Deleted

## 2008-08-15 ENCOUNTER — Ambulatory Visit: Payer: Self-pay | Admitting: Family Medicine

## 2008-08-15 LAB — CONVERTED CEMR LAB: Microalb, Ur: 0.51 mg/dL (ref 0.00–1.89)

## 2008-08-19 ENCOUNTER — Ambulatory Visit (HOSPITAL_COMMUNITY): Admission: RE | Admit: 2008-08-19 | Discharge: 2008-08-19 | Payer: Self-pay | Admitting: Family Medicine

## 2008-08-20 ENCOUNTER — Ambulatory Visit: Payer: Self-pay | Admitting: Cardiology

## 2008-08-20 DIAGNOSIS — E781 Pure hyperglyceridemia: Secondary | ICD-10-CM

## 2008-08-20 DIAGNOSIS — I509 Heart failure, unspecified: Secondary | ICD-10-CM | POA: Insufficient documentation

## 2008-08-20 DIAGNOSIS — T7841XA Arthus phenomenon, initial encounter: Secondary | ICD-10-CM

## 2008-08-20 LAB — CONVERTED CEMR LAB
ALT: 18 units/L (ref 0–35)
AST: 23 units/L (ref 0–37)
BUN: 16 mg/dL (ref 6–23)
Basophils Absolute: 0 10*3/uL (ref 0.0–0.1)
Bilirubin, Direct: 0.1 mg/dL (ref 0.0–0.3)
Calcium: 8.1 mg/dL — ABNORMAL LOW (ref 8.4–10.5)
Creatinine, Ser: 0.7 mg/dL (ref 0.4–1.2)
Eosinophils Relative: 1.9 % (ref 0.0–5.0)
Free T4: 0.7 ng/dL (ref 0.6–1.6)
GFR calc non Af Amer: 110.96 mL/min (ref >60)
Glucose, Bld: 77 mg/dL (ref 70–99)
Monocytes Relative: 13.2 % — ABNORMAL HIGH (ref 3.0–12.0)
Neutrophils Relative %: 48.3 % (ref 43.0–77.0)
POC INR: 2.4
Platelets: 189 10*3/uL (ref 150.0–400.0)
RDW: 12.9 % (ref 11.5–14.6)
TSH: 1.24 microintl units/mL (ref 0.35–5.50)
Total Bilirubin: 0.8 mg/dL (ref 0.3–1.2)
WBC: 7.5 10*3/uL (ref 4.5–10.5)

## 2008-08-21 LAB — CONVERTED CEMR LAB
Direct LDL: 34.8 mg/dL
HDL: 30.7 mg/dL — ABNORMAL LOW (ref >39.00)
Triglycerides: 463 mg/dL — ABNORMAL HIGH (ref 0.0–149.0)
VLDL: 92.6 mg/dL — ABNORMAL HIGH (ref 0.0–40.0)

## 2008-08-28 ENCOUNTER — Ambulatory Visit: Payer: Self-pay | Admitting: Emergency Medicine

## 2008-09-18 ENCOUNTER — Encounter: Payer: Self-pay | Admitting: *Deleted

## 2008-09-19 ENCOUNTER — Ambulatory Visit: Payer: Self-pay | Admitting: Internal Medicine

## 2008-09-19 LAB — CONVERTED CEMR LAB: POC INR: 2.6

## 2008-10-07 LAB — CONVERTED CEMR LAB
ALT: 25 units/L (ref 0–35)
Albumin: 3.2 g/dL — ABNORMAL LOW (ref 3.5–5.2)
Alkaline Phosphatase: 68 units/L (ref 39–117)
Bilirubin, Direct: 0.1 mg/dL (ref 0.0–0.3)
Total Protein: 6.9 g/dL (ref 6.0–8.3)

## 2008-10-10 ENCOUNTER — Ambulatory Visit (HOSPITAL_COMMUNITY): Admission: RE | Admit: 2008-10-10 | Discharge: 2008-10-10 | Payer: Self-pay | Admitting: *Deleted

## 2008-10-15 ENCOUNTER — Encounter: Payer: Self-pay | Admitting: Emergency Medicine

## 2008-10-17 ENCOUNTER — Ambulatory Visit: Payer: Self-pay | Admitting: Cardiology

## 2008-10-17 ENCOUNTER — Encounter: Payer: Self-pay | Admitting: Emergency Medicine

## 2008-10-17 DIAGNOSIS — I119 Hypertensive heart disease without heart failure: Secondary | ICD-10-CM | POA: Insufficient documentation

## 2008-10-18 LAB — CONVERTED CEMR LAB
ALT: 33 units/L (ref 0–35)
AST: 38 units/L — ABNORMAL HIGH (ref 0–37)
Albumin: 3.7 g/dL (ref 3.5–5.2)
Total Bilirubin: 0.8 mg/dL (ref 0.3–1.2)

## 2008-10-25 ENCOUNTER — Ambulatory Visit: Payer: Self-pay | Admitting: Emergency Medicine

## 2008-11-14 ENCOUNTER — Ambulatory Visit: Payer: Self-pay | Admitting: Family Medicine

## 2008-11-19 ENCOUNTER — Ambulatory Visit: Payer: Self-pay | Admitting: Cardiology

## 2008-11-19 LAB — CONVERTED CEMR LAB: POC INR: 2.3

## 2008-11-20 ENCOUNTER — Ambulatory Visit: Payer: Self-pay

## 2008-11-20 ENCOUNTER — Encounter: Payer: Self-pay | Admitting: Emergency Medicine

## 2008-11-23 LAB — CONVERTED CEMR LAB
Albumin: 3.3 g/dL — ABNORMAL LOW (ref 3.5–5.2)
Total Bilirubin: 1.1 mg/dL (ref 0.3–1.2)

## 2008-11-26 ENCOUNTER — Telehealth (INDEPENDENT_AMBULATORY_CARE_PROVIDER_SITE_OTHER): Payer: Self-pay | Admitting: *Deleted

## 2008-11-28 ENCOUNTER — Telehealth: Payer: Self-pay | Admitting: Emergency Medicine

## 2008-12-09 ENCOUNTER — Ambulatory Visit: Payer: Self-pay | Admitting: Emergency Medicine

## 2008-12-17 ENCOUNTER — Ambulatory Visit: Payer: Self-pay | Admitting: Cardiology

## 2008-12-17 LAB — CONVERTED CEMR LAB: POC INR: 2.2

## 2008-12-18 ENCOUNTER — Encounter: Payer: Self-pay | Admitting: Emergency Medicine

## 2008-12-18 LAB — CONVERTED CEMR LAB
AST: 27 units/L (ref 0–37)
Albumin: 3.5 g/dL (ref 3.5–5.2)
Alkaline Phosphatase: 79 units/L (ref 39–117)
Bilirubin, Direct: 0.1 mg/dL (ref 0.0–0.3)
Total Bilirubin: 0.6 mg/dL (ref 0.3–1.2)

## 2008-12-26 ENCOUNTER — Telehealth (INDEPENDENT_AMBULATORY_CARE_PROVIDER_SITE_OTHER): Payer: Self-pay | Admitting: *Deleted

## 2008-12-27 ENCOUNTER — Ambulatory Visit: Payer: Self-pay | Admitting: Family Medicine

## 2009-01-17 ENCOUNTER — Ambulatory Visit: Payer: Self-pay | Admitting: Emergency Medicine

## 2009-01-17 ENCOUNTER — Ambulatory Visit: Payer: Self-pay | Admitting: Cardiovascular Disease

## 2009-01-27 LAB — CONVERTED CEMR LAB
ALT: 16 units/L (ref 0–35)
AST: 21 units/L (ref 0–37)
Albumin: 3.3 g/dL — ABNORMAL LOW (ref 3.5–5.2)
Alkaline Phosphatase: 67 units/L (ref 39–117)
Bilirubin, Direct: 0 mg/dL (ref 0.0–0.3)
Total Protein: 7.3 g/dL (ref 6.0–8.3)

## 2009-01-28 ENCOUNTER — Ambulatory Visit: Payer: Self-pay | Admitting: Emergency Medicine

## 2009-02-10 ENCOUNTER — Ambulatory Visit: Payer: Self-pay | Admitting: Emergency Medicine

## 2009-02-13 ENCOUNTER — Ambulatory Visit: Payer: Self-pay | Admitting: Family Medicine

## 2009-02-13 LAB — CONVERTED CEMR LAB
BUN: 26 mg/dL — ABNORMAL HIGH
CO2: 21 meq/L
Calcium: 8.5 mg/dL
Chloride: 104 meq/L
Creatinine, Ser: 0.86 mg/dL
Glucose, Bld: 63 mg/dL — ABNORMAL LOW
Potassium: 3.8 meq/L
Sodium: 140 meq/L
Vit D, 25-Hydroxy: 42 ng/mL

## 2009-02-17 ENCOUNTER — Ambulatory Visit: Payer: Self-pay | Admitting: Cardiology

## 2009-02-18 LAB — CONVERTED CEMR LAB
AST: 20 units/L (ref 0–37)
Albumin: 3 g/dL — ABNORMAL LOW (ref 3.5–5.2)

## 2009-02-26 ENCOUNTER — Telehealth (INDEPENDENT_AMBULATORY_CARE_PROVIDER_SITE_OTHER): Payer: Self-pay | Admitting: *Deleted

## 2009-03-03 ENCOUNTER — Encounter: Payer: Self-pay | Admitting: Emergency Medicine

## 2009-03-20 ENCOUNTER — Encounter (INDEPENDENT_AMBULATORY_CARE_PROVIDER_SITE_OTHER): Payer: Self-pay

## 2009-03-20 ENCOUNTER — Ambulatory Visit: Payer: Self-pay | Admitting: Cardiovascular Disease

## 2009-03-20 ENCOUNTER — Ambulatory Visit: Payer: Self-pay | Admitting: Emergency Medicine

## 2009-03-20 LAB — CONVERTED CEMR LAB: POC INR: 2

## 2009-04-02 ENCOUNTER — Encounter: Payer: Self-pay | Admitting: Emergency Medicine

## 2009-04-02 LAB — CONVERTED CEMR LAB
Bilirubin, Direct: 0 mg/dL (ref 0.0–0.3)
Total Bilirubin: 0.8 mg/dL (ref 0.3–1.2)

## 2009-04-18 ENCOUNTER — Ambulatory Visit: Payer: Self-pay | Admitting: Internal Medicine

## 2009-04-18 LAB — CONVERTED CEMR LAB: POC INR: 2

## 2009-04-22 LAB — CONVERTED CEMR LAB
ALT: 17 units/L (ref 0–35)
AST: 22 units/L (ref 0–37)
Bilirubin, Direct: 0 mg/dL (ref 0.0–0.3)
Total Bilirubin: 0.5 mg/dL (ref 0.3–1.2)

## 2009-04-25 ENCOUNTER — Ambulatory Visit: Payer: Self-pay | Admitting: Emergency Medicine

## 2009-05-02 ENCOUNTER — Ambulatory Visit: Payer: Self-pay | Admitting: Internal Medicine

## 2009-05-05 LAB — CONVERTED CEMR LAB
BUN: 14 mg/dL (ref 6–23)
Basophils Absolute: 0 10*3/uL (ref 0.0–0.1)
CO2: 23 meq/L (ref 19–32)
Calcium: 7.4 mg/dL — ABNORMAL LOW (ref 8.4–10.5)
Digitoxin Lvl: <0.1 ng/mL — ABNORMAL LOW (ref 0.8–2.0)
Eosinophils Absolute: 0.1 10*3/uL (ref 0.0–0.7)
GFR calc non Af Amer: 94.88 mL/min (ref >60)
Glucose, Bld: 115 mg/dL — ABNORMAL HIGH (ref 70–99)
HCT: 35.5 % — ABNORMAL LOW (ref 36.0–46.0)
Hemoglobin: 11.5 g/dL — ABNORMAL LOW (ref 12.0–15.0)
Lymphocytes Relative: 21.5 % (ref 12.0–46.0)
Lymphs Abs: 1.4 10*3/uL (ref 0.7–4.0)
MCHC: 32.5 g/dL (ref 30.0–36.0)
Monocytes Relative: 7.7 % (ref 3.0–12.0)
Neutro Abs: 4.7 10*3/uL (ref 1.4–7.7)
Platelets: 253 10*3/uL (ref 150.0–400.0)
RDW: 12.7 % (ref 11.5–14.6)
Sodium: 138 meq/L (ref 135–145)

## 2009-05-08 ENCOUNTER — Encounter: Admission: RE | Admit: 2009-05-08 | Discharge: 2009-05-08 | Payer: Self-pay | Admitting: Family Medicine

## 2009-05-08 ENCOUNTER — Ambulatory Visit: Payer: Self-pay | Admitting: Internal Medicine

## 2009-05-12 LAB — CONVERTED CEMR LAB
Albumin: 3.5 g/dL (ref 3.5–5.2)
BUN: 19 mg/dL (ref 6–23)
Creatinine, Ser: 1 mg/dL (ref 0.4–1.2)
GFR calc non Af Amer: 73.33 mL/min (ref >60)

## 2009-05-16 ENCOUNTER — Ambulatory Visit: Payer: Self-pay | Admitting: Cardiology

## 2009-05-16 ENCOUNTER — Encounter (INDEPENDENT_AMBULATORY_CARE_PROVIDER_SITE_OTHER): Payer: Self-pay | Admitting: Cardiology

## 2009-05-16 LAB — CONVERTED CEMR LAB: POC INR: 2

## 2009-05-19 LAB — CONVERTED CEMR LAB
Albumin: 3.2 g/dL — ABNORMAL LOW (ref 3.5–5.2)
Total Protein: 6.8 g/dL (ref 6.0–8.3)

## 2009-05-22 ENCOUNTER — Ambulatory Visit: Payer: Self-pay | Admitting: Family Medicine

## 2009-05-26 ENCOUNTER — Encounter: Payer: Self-pay | Admitting: Emergency Medicine

## 2009-06-02 ENCOUNTER — Telehealth: Payer: Self-pay | Admitting: Emergency Medicine

## 2009-06-19 ENCOUNTER — Telehealth (INDEPENDENT_AMBULATORY_CARE_PROVIDER_SITE_OTHER): Payer: Self-pay | Admitting: *Deleted

## 2009-06-25 ENCOUNTER — Ambulatory Visit: Payer: Self-pay | Admitting: Internal Medicine

## 2009-06-25 LAB — CONVERTED CEMR LAB: POC INR: 2.3

## 2009-07-02 ENCOUNTER — Telehealth (INDEPENDENT_AMBULATORY_CARE_PROVIDER_SITE_OTHER): Payer: Self-pay | Admitting: *Deleted

## 2009-07-02 LAB — CONVERTED CEMR LAB
Alkaline Phosphatase: 65 units/L (ref 39–117)
Bilirubin, Direct: 0.1 mg/dL (ref 0.0–0.3)
Total Bilirubin: 0.2 mg/dL — ABNORMAL LOW (ref 0.3–1.2)

## 2009-07-16 ENCOUNTER — Encounter: Payer: Self-pay | Admitting: Emergency Medicine

## 2009-07-24 ENCOUNTER — Telehealth (INDEPENDENT_AMBULATORY_CARE_PROVIDER_SITE_OTHER): Payer: Self-pay | Admitting: *Deleted

## 2009-07-25 ENCOUNTER — Telehealth: Payer: Self-pay | Admitting: Emergency Medicine

## 2009-07-28 ENCOUNTER — Ambulatory Visit: Payer: Self-pay | Admitting: Emergency Medicine

## 2009-07-31 ENCOUNTER — Telehealth (INDEPENDENT_AMBULATORY_CARE_PROVIDER_SITE_OTHER): Payer: Self-pay | Admitting: *Deleted

## 2009-07-31 ENCOUNTER — Telehealth: Payer: Self-pay | Admitting: Emergency Medicine

## 2009-08-04 ENCOUNTER — Ambulatory Visit: Payer: Self-pay | Admitting: Emergency Medicine

## 2009-08-04 LAB — CONVERTED CEMR LAB
Albumin: 3.2 g/dL — ABNORMAL LOW (ref 3.5–5.2)
Total Protein: 6.2 g/dL (ref 6.0–8.3)

## 2009-08-06 ENCOUNTER — Encounter: Payer: Self-pay | Admitting: Emergency Medicine

## 2009-09-23 ENCOUNTER — Ambulatory Visit: Payer: Self-pay | Admitting: Emergency Medicine

## 2009-09-23 ENCOUNTER — Ambulatory Visit: Payer: Self-pay | Admitting: Cardiovascular Disease

## 2009-09-23 LAB — CONVERTED CEMR LAB: POC INR: 1.1

## 2009-09-25 ENCOUNTER — Ambulatory Visit: Payer: Self-pay | Admitting: Family Medicine

## 2009-09-29 ENCOUNTER — Ambulatory Visit (HOSPITAL_COMMUNITY): Admission: RE | Admit: 2009-09-29 | Discharge: 2009-09-29 | Payer: Self-pay | Admitting: Family Medicine

## 2009-09-29 ENCOUNTER — Telehealth (INDEPENDENT_AMBULATORY_CARE_PROVIDER_SITE_OTHER): Payer: Self-pay | Admitting: *Deleted

## 2009-10-02 ENCOUNTER — Telehealth (INDEPENDENT_AMBULATORY_CARE_PROVIDER_SITE_OTHER): Payer: Self-pay | Admitting: *Deleted

## 2009-10-02 LAB — CONVERTED CEMR LAB
Bilirubin, Direct: 0 mg/dL (ref 0.0–0.3)
Total Bilirubin: 0.3 mg/dL (ref 0.3–1.2)

## 2009-10-09 ENCOUNTER — Encounter: Payer: Self-pay | Admitting: Emergency Medicine

## 2009-10-10 ENCOUNTER — Ambulatory Visit: Payer: Self-pay | Admitting: Family Medicine

## 2009-10-14 ENCOUNTER — Encounter: Payer: Self-pay | Admitting: Emergency Medicine

## 2009-10-22 ENCOUNTER — Ambulatory Visit: Payer: Self-pay | Admitting: Emergency Medicine

## 2009-10-23 ENCOUNTER — Ambulatory Visit: Payer: Self-pay | Admitting: Cardiovascular Disease

## 2009-10-23 LAB — CONVERTED CEMR LAB: POC INR: 1.6

## 2009-10-30 ENCOUNTER — Telehealth (INDEPENDENT_AMBULATORY_CARE_PROVIDER_SITE_OTHER): Payer: Self-pay | Admitting: *Deleted

## 2009-11-04 ENCOUNTER — Telehealth: Payer: Self-pay | Admitting: Emergency Medicine

## 2009-11-20 ENCOUNTER — Encounter: Admission: RE | Admit: 2009-11-20 | Discharge: 2009-11-20 | Payer: Self-pay | Admitting: Orthopedic Surgery

## 2009-11-21 ENCOUNTER — Encounter: Payer: Self-pay | Admitting: Emergency Medicine

## 2009-11-24 ENCOUNTER — Telehealth (INDEPENDENT_AMBULATORY_CARE_PROVIDER_SITE_OTHER): Payer: Self-pay | Admitting: *Deleted

## 2009-11-27 ENCOUNTER — Encounter: Payer: Self-pay | Admitting: Gastroenterology

## 2009-11-28 ENCOUNTER — Encounter (INDEPENDENT_AMBULATORY_CARE_PROVIDER_SITE_OTHER): Payer: Self-pay | Admitting: *Deleted

## 2009-12-05 ENCOUNTER — Telehealth (INDEPENDENT_AMBULATORY_CARE_PROVIDER_SITE_OTHER): Payer: Self-pay | Admitting: *Deleted

## 2009-12-13 HISTORY — PX: SHOULDER ARTHROSCOPY: SHX128

## 2009-12-19 ENCOUNTER — Ambulatory Visit: Payer: Self-pay | Admitting: Emergency Medicine

## 2009-12-19 LAB — CONVERTED CEMR LAB: POC INR: 2.2

## 2009-12-24 LAB — CONVERTED CEMR LAB
Alkaline Phosphatase: 56 units/L (ref 39–117)
Bilirubin, Direct: 0.1 mg/dL (ref 0.0–0.3)

## 2009-12-31 ENCOUNTER — Ambulatory Visit (HOSPITAL_COMMUNITY)
Admission: RE | Admit: 2009-12-31 | Discharge: 2010-01-01 | Payer: Self-pay | Source: Home / Self Care | Admitting: Orthopedic Surgery

## 2010-01-13 HISTORY — PX: SHOULDER ARTHROSCOPY W/ ROTATOR CUFF REPAIR: SHX2400

## 2010-01-21 ENCOUNTER — Encounter: Payer: Self-pay | Admitting: Emergency Medicine

## 2010-01-21 ENCOUNTER — Ambulatory Visit: Payer: Self-pay | Admitting: Gastroenterology

## 2010-01-21 DIAGNOSIS — R1310 Dysphagia, unspecified: Secondary | ICD-10-CM | POA: Insufficient documentation

## 2010-01-21 DIAGNOSIS — D684 Acquired coagulation factor deficiency: Secondary | ICD-10-CM

## 2010-01-21 DIAGNOSIS — K3184 Gastroparesis: Secondary | ICD-10-CM

## 2010-01-22 ENCOUNTER — Ambulatory Visit: Payer: Self-pay | Admitting: Cardiology

## 2010-01-27 ENCOUNTER — Telehealth: Payer: Self-pay | Admitting: Gastroenterology

## 2010-01-27 ENCOUNTER — Telehealth (INDEPENDENT_AMBULATORY_CARE_PROVIDER_SITE_OTHER): Payer: Self-pay | Admitting: *Deleted

## 2010-01-29 ENCOUNTER — Ambulatory Visit: Payer: Self-pay | Admitting: Emergency Medicine

## 2010-02-02 ENCOUNTER — Telehealth: Payer: Self-pay | Admitting: Emergency Medicine

## 2010-02-19 ENCOUNTER — Ambulatory Visit: Payer: Self-pay | Admitting: Emergency Medicine

## 2010-02-23 ENCOUNTER — Ambulatory Visit (HOSPITAL_COMMUNITY)
Admission: RE | Admit: 2010-02-23 | Discharge: 2010-02-23 | Payer: Self-pay | Source: Home / Self Care | Attending: Gastroenterology | Admitting: Gastroenterology

## 2010-02-23 ENCOUNTER — Encounter: Payer: Self-pay | Admitting: Gastroenterology

## 2010-02-23 HISTORY — PX: ESOPHAGOGASTRODUODENOSCOPY: SHX1529

## 2010-02-25 ENCOUNTER — Encounter: Payer: Self-pay | Admitting: Gastroenterology

## 2010-02-25 ENCOUNTER — Telehealth (INDEPENDENT_AMBULATORY_CARE_PROVIDER_SITE_OTHER): Payer: Self-pay | Admitting: *Deleted

## 2010-03-02 ENCOUNTER — Ambulatory Visit: Payer: Self-pay | Admitting: Cardiology

## 2010-03-06 ENCOUNTER — Encounter: Payer: Self-pay | Admitting: Cardiology

## 2010-03-06 LAB — CONVERTED CEMR LAB: POC INR: 2.4

## 2010-03-12 LAB — CONVERTED CEMR LAB
ALT: 16 units/L (ref 0–35)
Bilirubin, Direct: 0.1 mg/dL (ref 0.0–0.3)
Indirect Bilirubin: 0.2 mg/dL (ref 0.0–0.9)

## 2010-03-23 ENCOUNTER — Encounter (INDEPENDENT_AMBULATORY_CARE_PROVIDER_SITE_OTHER): Payer: Self-pay

## 2010-03-27 ENCOUNTER — Encounter: Payer: Self-pay | Admitting: Emergency Medicine

## 2010-03-30 ENCOUNTER — Ambulatory Visit
Admission: RE | Admit: 2010-03-30 | Discharge: 2010-03-30 | Payer: Self-pay | Source: Home / Self Care | Attending: Gastroenterology | Admitting: Gastroenterology

## 2010-04-03 ENCOUNTER — Other Ambulatory Visit: Payer: Self-pay | Admitting: Emergency Medicine

## 2010-04-03 ENCOUNTER — Encounter: Payer: Self-pay | Admitting: Emergency Medicine

## 2010-04-03 ENCOUNTER — Encounter: Payer: Self-pay | Admitting: Cardiology

## 2010-04-03 ENCOUNTER — Ambulatory Visit: Admission: RE | Admit: 2010-04-03 | Discharge: 2010-04-03 | Payer: Self-pay | Source: Home / Self Care

## 2010-04-03 LAB — BASIC METABOLIC PANEL
BUN: 19 mg/dL (ref 6–23)
CO2: 23 mEq/L (ref 19–32)
Calcium: 9.2 mg/dL (ref 8.4–10.5)
Chloride: 103 mEq/L (ref 96–112)
Creatinine, Ser: 0.7 mg/dL (ref 0.4–1.2)
GFR: 106.8 mL/min (ref >60.00)
Glucose, Bld: 220 mg/dL — ABNORMAL HIGH (ref 70–99)
Potassium: 4 mEq/L (ref 3.5–5.1)
Sodium: 138 mEq/L (ref 135–145)

## 2010-04-14 NOTE — Medication Information (Signed)
Summary: [name]  Anticoagulant Therapy  Managed by: Freddrick March, RN, BSN Referring MD: Baltazar Apo PCP: Ellsworth Lennox Supervising MD: Burt Knack MD, Legrand Como Indication 1: Pulmonary Hypertension (dx code) (ICD-402.10) Lab Used: [place] Site: [facility] INR POC 2.0 INR RANGE 2 - 3  Dietary changes: no    Health status changes: no    Bleeding/hemorrhagic complications: no    Recent/future hospitalizations: no    Any changes in medication regimen? no    Recent/future dental: no  Any missed doses?: no       Is patient compliant with meds? yes       Allergies (verified): 1)  ! Codeine 2)  ! * Ivp Dye  Anticoagulation Management History:      The patient is taking warfarin and comes in today for a routine follow up visit.  Positive risk factors for bleeding include presence of serious comorbidities.  Negative risk factors for bleeding include an age less than 65 years old.  The bleeding index is 'intermediate risk'.  Positive CHADS2 values include History of CHF, History of HTN, and History of Diabetes.  Negative CHADS2 values include Age > 75 years old.  The start date was 05/18/2004.  Her last INR was 2.1 RATIO.  Anticoagulation responsible provider: Burt Knack MD, Legrand Como.  INR POC: 2.0.  Cuvette Lot#: [id number].  Exp: 04/2010.    Anticoagulation Management Assessment/Plan:      The patient's current anticoagulation dose is Warfarin sodium 2.5 mg tabs: Take as directed by coumadin clinic..  The target INR is 2 - 3.  The next INR is due 04/18/2009.  Anticoagulation instructions were given to patient.  Results were reviewed/authorized by Freddrick March, RN, BSN.  She was notified by Freddrick March RN.         Prior Anticoagulation Instructions: INR 2.0  Continue on same dosage 1.5 tablets daily except 2 tablet on Sundays and Thursdays.  Recheck in 4 weeks.   Current Anticoagulation Instructions: INR 2.0  Continue on same dosage 1.5 tablets daily except 2 tablets on Sundays  and Thursdays.   Recheck in 4 weeks.

## 2010-04-14 NOTE — Medication Information (Signed)
Summary: rov/ewj  Anticoagulant Therapy  Managed by: Pamela Nakayama, RN, BSN Referring MD: Baltazar Apo PCP: Ellsworth Lennox, MD  Supervising MD: Percival Spanish MD, Jeneen Rinks Indication 1: Pulmonary Hypertension (dx code) (ICD-402.10) Lab Used: [place] Site: [facility] INR POC 2.3 INR RANGE 2 - 3  Dietary changes: no    Health status changes: no    Bleeding/hemorrhagic complications: no    Recent/future hospitalizations: no    Any changes in medication regimen? yes       Details: Started Ampicillin 287ms yesterday for 7days  Recent/future dental: no  Any missed doses?: no       Is patient compliant with meds? yes      Comments: Scheduled for Upper Endo on 02/23/10 with Dr KDeatra Ina flag sent to Dr BLamonte Sakaiabout holding coumadin.   Allergies: 1)  ! Codeine 2)  ! * Ivp Dye  Anticoagulation Management History:      The patient is taking warfarin and comes in today for a routine follow up visit.  Positive risk factors for bleeding include presence of serious comorbidities.  Negative risk factors for bleeding include an age less than 629years old.  The bleeding index is 'intermediate risk'.  Positive CHADS2 values include History of CHF, History of HTN, and History of Diabetes.  Negative CHADS2 values include Age > 725years old.  The start date was 05/18/2004.  Her last INR was 2.1 RATIO.  Anticoagulation responsible provider: HPercival SpanishMD, JJeneen Rinks  INR POC: 2.3.  Cuvette Lot#: [id number]  Exp: 01/2011.    Anticoagulation Management Assessment/Plan:      The patient's current anticoagulation dose is Warfarin sodium 2.5 mg tabs: Take as directed by coumadin clinic..  The target INR is 2 - 3.  The next INR is due 03/02/2010.  Anticoagulation instructions were given to patient.  Results were reviewed/authorized by TTula Nakayama RN, BSN.  She was notified by TTula Nakayama RN, BSN.         Current Anticoagulation Instructions: INR 2.3 Continue 1.5 pills everyday except 2 pills on Sundays and  Thursdays. Take last dose on 02/17/10 if OK'd by Dr BLamonte Sakai  Restart coumadin post procedure per Dr KKelby Faminstructions.

## 2010-04-14 NOTE — Miscellaneous (Signed)
Summary: Orders Update  Clinical Lists Changes  Orders: Added new Test order of TLB-Hepatic/Liver Function Pnl (80076-HEPATIC) - Signed 

## 2010-04-14 NOTE — Letter (Signed)
Summary: Anticoagulation Modification Letter OK TO [facility]  [place], [place] [address]   Phone: [phone]  Fax: [phone]    January 21, 2010  Re:    [name] DOB:    1950-04-12 MRN:    [id number]    Dear Dr Lamonte Sakai,  We have scheduled the above patient for an endoscopic procedure. Our records show that  he/she is on anticoagulation therapy. Please advise as to how long the patient may come off their therapy of coumadin  prior to the scheduled procedure(s) on 02/23/2010   Please fax back/or route the completed form to [facility]  at [phone].  Thank you for your help with this matter.  Sincerely,  Genella Mech CMA Deborra Medina)   Physician Recommendation:  Hold Plavix 7 days prior ________________  Hold Coumadin 5 days prior ____________  Other ______________________________     Appended Document: Anticoagulation Modification Letter Signed and faxed today. plan stop coumadin x 5 days. RSB  Appended Document: Anticoagulation Modification Letter Called pt to inform ok to hold coumadin L/M

## 2010-04-14 NOTE — Progress Notes (Signed)
Summary: still sick after zpak  Phone Note Call from Patient   Caller: Patient Call For: [name] Summary of Call: pt still coughing . she is taking tyvaso [facility] Initial call taken by: Gustavus Bryant,  February 02, 2010 2:10 PM  Follow-up for Phone Call        [facility] [place]  February 02, 2010 3:31 PM  spoke with pt and she states that she finished zpak yesterday and does not feel any better. She still has a productive cough with green phlegm, SOB and wheezing. Pt is currently on Tyvaso. I advised pt she will need an appt. She states she is unable to come in today so pt set to see MW tomorrow at 10:20. Pt advised if anyting changes or gets worse to call or go to ER.[place] Bing CMA  February 03, 2010 9:17 AM

## 2010-04-14 NOTE — Assessment & Plan Note (Signed)
Summary: yearly f/u cy  Medications Added DIGOXIN 0.125 MG TABS (DIGOXIN) 1 tab once daily      Allergies Added:   Visit Type:  Follow-up Primary Provider:  Ellsworth Lennox  CC:  no complaints.  History of Present Illness: [name] is a 60 year old with a history of scleroderma and secondary pulmonary hypertension.  I saw her one year ago.  She is usually followed by R. Byrum.  She comes back today for f/u since she is followed in the coumadin clinic. On review of her record and on discussion with the patient she had done much better when she was taking Tyvaso.  SHe stopped but is now resuming it slowly.  Breathing improved, her excercise toleratnce improved.  She is slowly ramping up the inhalations.  Does get some chest tightness when she walks too much.  Current Medications (verified): 1)  Lisinopril 10 Mg Tabs (Lisinopril) .Marland Kitchen.. 1 Tab Once Daily 2)  Digoxin 0.125 Mg Tabs (Digoxin) .Marland Kitchen.. 1 Tab Once Daily 3)  Metolazone 2.5 Mg Tabs (Metolazone) .Marland Kitchen.. 1 Tab Every Am 4)  Metoclopramide Hcl 10 Mg Tabs (Metoclopramide Hcl) .... 1/2 Half Tablet Before Meals and At Bedtime 5)  Warfarin Sodium 2.5 Mg Tabs (Warfarin Sodium) .... Take As Directed By Coumadin Clinic. 6)  Prednisone 5 Mg Tabs (Prednisone) .Marland Kitchen.. 1 Tab Two Times A Day 7)  Furosemide 20 Mg Tabs (Furosemide) .... 2 Tabs Once Daily 8)  Lyrica 150 Mg Caps (Pregabalin) .Marland Kitchen.. 1 Tab Three Times A Day 9)  Hydrocodone-Acetaminophen 10-650 Mg Tabs (Hydrocodone-Acetaminophen) .Marland Kitchen.. 1 Tab Four Times A Day As Needed For Pain 10)  Glucophage 500 Mg Tabs (Metformin Hcl) .Marland Kitchen.. 1 By Mouth Two Times A Day With Meals 11)  Premarin 0.625 Mg Tabs (Estrogens Conjugated) .Marland Kitchen.. 1 By Mouth Daily 12)  Amaryl 4 Mg Tabs (Glimepiride) .Marland Kitchen.. 1 By Mouth Every Am and 1/2 Tab Every Pm With Meals 13)  Tracleer 125 Mg Tabs (Bosentan) .... Take 1 Tablet By Mouth Two Times A Day 14)  Onetouch Ultrasoft Lancets   Misc (Lancets) .... Use T Ocheck Blood Glucose 2-3 Times A  Day 15)  Nexium 40 Mg Cpdr (Esomeprazole Magnesium) .Marland Kitchen.. 1 By Mouth Two Times A Day 16)  Tyvaso Starter 0.6 Mg/ml Soln (Treprostinil) .... Four Times A Day  Allergies (verified): 1)  ! Codeine 2)  ! 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  Alcohol Use - no Drug Use - no  Review of Systems       Some headaches when began Tyvaso too fast.  Occasional LE edema.  No presyncope/syncope  Vital Signs:  Patient profile:   58 y74r old female Height:      64 inches Weight:      214 pounds BMI:  36.87 Pulse rate:   92 / minute BP sitting:   109 / 63  (left arm) Cuff size:   regular  Vitals Entered By: Lubertha Basque, CNA (May 02, 2009 12:01 PM)  Physical Exam  Additional Exam:  HEENT:  Normocephalic, atraumatic. EOMI, PERRLA.  Neck: JVP is normal. No thyromegaly. No bruits.  Lungs: clear to auscultation. No rales no wheezes.  Heart: Regular rate and rhythm. Normal S1, S2. No S3.   No significant murmurs. PMI not displaced.  Abdomen:  Supple, nontender. Normal bowel sounds. No masses. No hepatomegaly.  Extremities:   Good distal pulses throughout. No lower extremity edema.    Musculoskeletal :moving all extremities.  Neuro:   alert and oriented x3.    Impression & Recommendations:  Problem # 1:  PULMONARY HYPERTENSION, SECONDARY (ICD-416.8) Patient had a great response to Tyvaso.  Drop in est PAP significant.  Getting back on med now. I wll nt make any changes.  Check Dig level and CBC today. Orders: TLB-BMP (Basic Metabolic Panel-BMET) ([id number]) TLB-CBC Platelet - w/Differential (85025-[id number]) TLB-Digoxin (Lanoxin) (80162-DIG)  Problem # 2:  HYPERTENSION, ESSENTIAL NOS (ICD-401.9) BP is good.  I will check labs today. Her updated medication list for this problem includes:    Lisinopril 10 Mg Tabs (Lisinopril) .Marland Kitchen... 1 tab once daily    Metolazone 2.5 Mg Tabs (Metolazone) .Marland Kitchen... 1 tab every am    Furosemide 20 Mg Tabs (Furosemide) .Marland Kitchen... 2 tabs once daily  Patient Instructions: 1)  Labs today--bmet, cbc, and dig level 2)  Follow up in 1 year

## 2010-04-14 NOTE — Assessment & Plan Note (Signed)
Summary: PAH, scleroderma   Visit Type:  Follow-up Primary Provider/Referring Provider:  Ellsworth Lennox  CC:  PAH. The patient says she is doing well.. She is only using the Tyvaso 2 to 3 times daily.Marland Kitchen  History of Present Illness: [name] is a 60 year old woman with scleroderma and severe secondary pulmonary hypertension.  She had been treated with bosentan + Coumadin. Was showing decrease in 6 minute walk, worsening PASP on TTE so initiated Ventavis. Was unable to tolerate, although felt clinically stable. Also unable to use Adcirca due to HA. Finally started on Tyvaso and now titrated up to full dosing.    ROV 12/09/08 -- Returns to discuss TTE results, see below. Her PASP has improved from 156mHg (7/09) to 447mg (9/10)!! She feels much better, exercise tolerance is great. No more chest tightness. Tolerating Tyvaso - no HA, no syncope or pre-syncope.   ROV 02/10/09 -- Continues to feel well. Tolerating Tyvaso, Tracleer. 6 minute walk improved to 32165m1/2010), correlates with improved TTE results. No new problems.   ROV 04/25/09 -- returns for f/u today. Has been taking care of her mother for most of the day. She has been tired more. Having more difficulty taking her Tyvaso on the desired schedule. Has been taking it only intermittantly - there have been days that she skips it, then goes back to full dosing 3 -4 days later. When she restarts she has predicatble HA, dizziness, weakness.   ROV 08/04/09 -- scheduled ROV for PAH in setting scleroderma. Maintained on Bosentan and Tyvaso. Tells me that she is feeling well. Exertional tolerance is good, rarely stops to rest due to breathing. She has been taking the Tyvaso about three times a day, about 5 days a week, 9 puffs. Last walk and TTE as above.  On coumadin, lasix 56m30mo times a day   Current Medications (verified): 1)  Lisinopril 10 Mg Tabs (Lisinopril) .... Marland Kitchen Tab Once Daily 2)  Digoxin 0.125 Mg Tabs (Digoxin) .... Marland Kitchen Tab Once  Daily 3)  Metolazone 2.5 Mg Tabs (Metolazone) .... Marland Kitchen Tab Every Am 4)  Metoclopramide Hcl 10 Mg Tabs (Metoclopramide Hcl) .... 1/2 Half Tablet Before Meals and At Bedtime 5)  Warfarin Sodium 2.5 Mg Tabs (Warfarin Sodium) .... Take As Directed By Coumadin Clinic. 6)  Prednisone 5 Mg Tabs (Prednisone) .... Marland Kitchen Tab Two Times A Day 7)  Furosemide 20 Mg Tabs (Furosemide) .... 2 Tabs Once Daily 8)  Lyrica 150 Mg Caps (Pregabalin) .... Marland Kitchen Tab Three Times A Day 9)  Hydrocodone-Acetaminophen 10-650 Mg Tabs (Hydrocodone-Acetaminophen) .... Marland Kitchen Tab Four Times A Day As Needed For Pain 10)  Glucophage 500 Mg Tabs (Metformin Hcl) .... Marland Kitchen By Mouth Two Times A Day With Meals 11)  Premarin 0.625 Mg Tabs (Estrogens Conjugated) .... Marland Kitchen By Mouth Daily 12)  Amaryl 4 Mg Tabs (Glimepiride) .... Marland Kitchen By Mouth Every Am and 1/2 Tab Every Pm With Meals 13)  Tracleer 125 Mg Tabs (Bosentan) .... Take 1 Tablet By Mouth Two Times A Day 14)  Onetouch Ultrasoft Lancets   Misc (Lancets) .... Use T Ocheck Blood Glucose 2-3 Times A Day 15)  Nexium 40 Mg Cpdr (Esomeprazole Magnesium) .... Marland Kitchen By Mouth Two Times A Day 16)  Tyvaso Starter 0.6 Mg/ml Soln (Treprostinil) .... Four Times A Day  Allergies (verified): 1)  ! Codeine 2)  ! * Ivp Dye  Vital Signs:  Patient profile:   58 y4r old female Height:      64 inches (162.56 cm) Weight:  209 pounds (95.00 kg) BMI:     36.00 O2 Sat:      96 % on Room air Temp:     98.2 degrees F (36.78 degrees C) oral Pulse rate:   75 / minute BP sitting:   122 / 64  (left arm) Cuff size:   large  Vitals Entered By: Francesca Jewett CMA (Aug 04, 2009 9:41 AM)  O2 Sat at Rest %:  96 O2 Flow:  Room air CC: PAH. The patient says she is doing well.. She is only using the Tyvaso 2 to 3 times daily. Comments Medications reviewed. Daytime phone verified. Francesca Jewett CMA  Aug 04, 2009 9:42 AM   Physical Exam  General:  normal appearance, healthy appearing, and obese.   Head:  normocephalic and  atraumatic Nose:  no deformity, discharge, inflammation, or lesions Mouth:  poor dentition.  otherwise normal. No posterior pharyngeal erythema Neck:  no masses, thyromegaly, or abnormal cervical nodes Lungs:  bibasilar soft insp crackles, no wheezes Heart:  regular, normal s1, loud s2. 2/6 M at end systole Abdomen:  not examined Extremities:  trace edema Skin:  no significant sclerodactyly Psych:  alert and cooperative; normal mood and affect; normal attention span and concentration   Impression & Recommendations:  Problem # 1:  PULMONARY HYPERTENSION, SECONDARY (ICD-416.8) Clinically stable. Good compliance with bosentan but still only taking the Tyvaso sporadically.  - discussed importance of taking the tyvaso EVERY day with consistancy. Will make this our goal, then work on ramping up dosing. She will try to do 9 puffs three times a day every day.  - coumadin - lasix 13m two times a day. she does have some crackles on exam, no ether evidence volume overload. Will check CXR next time - 6 minute walk and TTE in November 2011 - ROV 3 months  Problem # 2:  SCLERODERMA (ICD-710.1)  Orders: Est. Patient Level IV ((01027 Prescription Created Electronically [phone]  Patient Instructions: 1)  Continue your Tracleer two times a day  2)  Start taking your Tyvaso 9 puffs, 7 days a week, even if you are only able to do three times a day. 3)  Continue your prednisone as you are taking it 4)  Continue your lasix and coumadin as ordered 5)  Get your bloodwork every month 6)  We will perform 6 minute walk and echocardiogram in November 2011.  7)  Follow up with Dr BLamonte Sakaiin 3 months or as needed  Prescriptions: PREDNISONE 5 MG TABS (PREDNISONE) 1 tab two times a day  #60 x 11   Entered and Authorized by:   RCollene GobbleMD   Signed by:   RCollene GobbleMD on 08/04/2009   Method used:   Electronically to        [facility]       [address]       [place]  [address]      Ph: [phone]      Fax: [phone]  RxID:   [id number]

## 2010-04-14 NOTE — Op Note (Signed)
Summary: Endoscopy  AME:  [name], [name]                 ACCOUNT NO.:  [id number]   MEDICAL RECORD NO.:  [id number]          PATIENT TYPE:  AMB   LOCATION:  ENDO                         FACILITY:  [facility]   PHYSICIAN:  Waverly Ferrari, M.D.    DATE OF BIRTH:  January 07, 1951   DATE OF PROCEDURE:  01/06/2006  DATE OF DISCHARGE:                                 OPERATIVE REPORT   PROCEDURE:  Upper endoscopy.   INDICATIONS:  Dysphagia with known scleroderma.   ANESTHESIA:  Fentanyl 50 mcg, Versed 5 mg.   PROCEDURE:  With the patient mildly sedated in the left lateral decubitus  position, the Olympus videoscopic endoscope was inserted in the mouth,  passed under direct vision through the esophagus, which appeared widely  patent.  We entered into the stomach.  The fundus, body appeared normal.  Antrum showed some changes of blood in the antrum and some ulcerations,  which were photographed and biopsied.  Duodenal bulb, second portion  duodenum appeared normal.  From this point the endoscope was slowly  withdrawn taking circumferential views of duodenal mucosa until the  endoscope had been pulled back into the stomach, placed in retroflexion to  view the stomach from below.  The endoscope was then straightened and  withdrawn taking circumferential views of the remaining gastric and  esophageal mucosa.  The patient's vital signs and pulse oximetry remained  stable.  The patient tolerated the procedure well without apparent  complications.   FINDINGS:  Ulcerations the antrum.  Widely patent esophagus.   Await biopsy reports.  The patient will call me for results and follow up  with me as an outpatient.           ______________________________  Waverly Ferrari, M.D.     GMO/MEDQ  D:  01/06/2006  T:  01/07/2006  Job:  [id number]

## 2010-04-14 NOTE — Letter (Signed)
Summary: EGD Instructions  [facility]  [place], [place] [address]   Phone: [phone]  Fax: [phone]       [name]    Nov 13, 1950    MRN: [id number]       Procedure Day /Date:MONDAY 02/23/2010     Arrival Time: 11:30PM     Procedure Time:12:30PM     Location of Procedure:                     X  [facility] ( Outpatient Registration)  PREPARATION FOR ENDOSCOPY/BALLOON   On12/02/2010 THE DAY OF THE PROCEDURE:  1.   No solid foods, milk or milk products are allowed after midnight the night before your procedure.  2.   Do not drink anything colored red or purple.  Avoid juices with pulp.  No orange juice.  3.  You may drink clear liquids until8:30AM  , which is 2 hours before your procedure.                                                                                                CLEAR LIQUIDS INCLUDE: Water Jello Ice Popsicles Tea (sugar ok, no milk/cream) Powdered fruit flavored drinks Coffee (sugar ok, no milk/cream) Gatorade Juice: apple, white grape, white cranberry  Lemonade Clear bullion, consomm, broth Carbonated beverages (any kind) Strained chicken noodle soup Hard Candy   MEDICATION INSTRUCTIONS  Unless otherwise instructed, you should take regular prescription medications with a small sip of water as early as possible the morning of your procedure.  Diabetic patients - see separate instructions.   Stop taking Coumadin on  _  (5 days before procedure).  Additional medication instructions: You will be contaced by our office prior to your procedure for directions on holding your Coumadin/Warfarin.  If you do not hear from our office 1 week prior to your scheduled procedure, please call [phone] to discuss.              OTHER INSTRUCTIONS  You will need a responsible adult at least 59 years of age to accompany you and drive you home.   This person must remain in the waiting room during your  procedure.  Wear loose fitting clothing that is easily removed.  Leave jewelry and other valuables at home.  However, you may wish to bring a book to read or an iPod/MP3 player to listen to music as you wait for your procedure to start.  Remove all body piercing jewelry and leave at home.  Total time from sign-in until discharge is approximately 2-3 hours.  You should go home directly after your procedure and rest.  You can resume normal activities the day after your procedure.  The day of your procedure you should not:   Drive   Make legal decisions   Operate machinery   Drink alcohol   Return to work  You will receive specific instructions about eating, activities and medications before you leave.    The above instructions have been reviewed and explained to me by   _______________________    I  fully understand and can verbalize these instructions _____________________________ Date _________

## 2010-04-14 NOTE — Medication Information (Signed)
Summary: rov/ewj  Anticoagulant Therapy  Managed by: Tula Nakayama, RN, BSN Referring MD: Baltazar Apo PCP: Ellsworth Lennox Supervising MD: Rayann Heman MD, Jeneen Rinks Indication 1: Pulmonary Hypertension (dx code) (ICD-402.10) Lab Used: [facility] Site: [facility] INR POC 2.0 INR RANGE 2 - 3  Dietary changes: no    Health status changes: no    Bleeding/hemorrhagic complications: yes       Details: nose bleed.   Recent/future hospitalizations: no    Any changes in medication regimen? no    Recent/future dental: no  Any missed doses?: no       Is patient compliant with meds? yes       Allergies: 1)  ! Codeine 2)  ! * Ivp Dye  Anticoagulation Management History:      The patient is taking warfarin and comes in today for a routine follow up visit.  Positive risk factors for bleeding include presence of serious comorbidities.  Negative risk factors for bleeding include an age less than 81 years old.  The bleeding index is 'intermediate risk'.  Positive CHADS2 values include History of CHF, History of HTN, and History of Diabetes.  Negative CHADS2 values include Age > 66 years old.  The start date was 05/18/2004.  Her last INR was 2.1 RATIO.  Anticoagulation responsible provider: Angeleigh Chiasson MD, Jeneen Rinks.  INR POC: 2.0.  Cuvette Lot#: [id number].  Exp: 06/2010.    Anticoagulation Management Assessment/Plan:      The patient's current anticoagulation dose is Warfarin sodium 2.5 mg tabs: Take as directed by coumadin clinic..  The target INR is 2 - 3.  The next INR is due 05/16/2009.  Anticoagulation instructions were given to patient.  Results were reviewed/authorized by Tula Nakayama, RN, BSN.  She was notified by Tula Nakayama, RN, BSN.         Prior Anticoagulation Instructions: INR 2.0  Continue on same dosage 1.5 tablets daily except 2 tablets on Sundays and Thursdays.   Recheck in 4 weeks.    Current Anticoagulation Instructions: INR 2.0 Continue 3.61ms daily except 519m on Thursdays.  Recheck in 4 weeks.

## 2010-04-14 NOTE — Medication Information (Signed)
Summary: rov/sp  Anticoagulant Therapy  Managed by: Porfirio Oar, PharmD Referring MD: Baltazar Apo PCP: Ellsworth Lennox Supervising MD: Angelena Form MD, Harrell Gave Indication 1: Pulmonary Hypertension (dx code) (ICD-402.10) Lab Used: [place] Site: [facility] INR POC 1.1 INR RANGE 2 - 3  Dietary changes: no    Health status changes: yes       Details: having spinal injection tomorrow  Bleeding/hemorrhagic complications: no    Recent/future hospitalizations: no    Any changes in medication regimen? no    Recent/future dental: no  Any missed doses?: yes     Details: been off Coumadin 5 days for injection  Is patient compliant with meds? yes      Comments: Requested pt return to clinic in 10-14 days.  Pt refused.  Discussed the risks.  Pt wanted to return in 4 weeks with her mother.  She is aware to call with any questions or problems.   Allergies: 1)  ! Codeine 2)  ! * Ivp Dye  Anticoagulation Management History:      The patient is taking warfarin and comes in today for a routine follow up visit.  Positive risk factors for bleeding include presence of serious comorbidities.  Negative risk factors for bleeding include an age less than 48 years old.  The bleeding index is 'intermediate risk'.  Positive CHADS2 values include History of CHF, History of HTN, and History of Diabetes.  Negative CHADS2 values include Age > 59 years old.  The start date was 05/18/2004.  Her last INR was 2.1 RATIO.  Anticoagulation responsible provider: Angelena Form MD, Harrell Gave.  INR POC: 1.1.  Cuvette Lot#: [id number].  Exp: 10/2010.    Anticoagulation Management Assessment/Plan:      The patient's current anticoagulation dose is Warfarin sodium 2.5 mg tabs: Take as directed by coumadin clinic..  The target INR is 2 - 3.  The next INR is due 08/20/2009.  Anticoagulation instructions were given to patient.  Results were reviewed/authorized by Porfirio Oar, PharmD.  She was notified by Porfirio Oar PharmD.         Prior Anticoagulation Instructions: INR 2.3  Continue same dose of 1 1/2 tablet every day except 2 tablets on Sunday and Thursday   Current Anticoagulation Instructions: INR 1.1  Restart Coumadin when okay with MD.  Billy Fischer with 2 tablets x 1 day then resume same dose of 1 1/2 tablet every day except 2 tablets on Sunday and Thursday.

## 2010-04-14 NOTE — Letter (Signed)
Summary: Anticoagulation Modification Letter / [facility]  Anticoagulation Modification Letter / [facility]   Imported By: Rise Patience 01/27/2010 11:53:15  _____________________________________________________________________  External Attachment:    Type:   Image     Comment:   External Document

## 2010-04-14 NOTE — Letter (Signed)
Summary: [facility]   Imported By: Phillis Knack 01/26/2010 10:26:44  _____________________________________________________________________  External Attachment:    Type:   Image     Comment:   External Document

## 2010-04-14 NOTE — Progress Notes (Signed)
Summary: tracleer shipment  Phone Note From Pharmacy   Caller: luke w/ [facility] Call For: Burris Matherne  Summary of Call: can tracleer be shipped to pt? she hasn't had monthly pregnancy test per caller. [phone] x [phone] Initial call taken by: Cooper Render, CNA,  Jul 31, 2009 5:28 PM  Follow-up for Phone Call        according to prev phone notes and pt record pt has had hysterectomy, so ok to ship tracleer. Pharmacists states that on referral form it was checked that she was still child bearing, so they are faxing a new form to be completed that states seh is not child bearing so we do not get this call every month. Revere Bing CMA  Aug 01, 2009 9:06 AM

## 2010-04-14 NOTE — Letter (Signed)
Summary: [facility] Pharmacist  [facility] Pharmacist   Imported By: Bubba Hales 10/17/2009 13:45:43  _____________________________________________________________________  External Attachment:    Type:   Image     Comment:   External Document

## 2010-04-14 NOTE — Progress Notes (Signed)
Summary: nos appt  Phone Note Call from Patient   Caller: [name]_0  Call For: [name] Summary of Call: LMTCB x2 to rsc nos from 5/12. Initial call taken by: Netta Neat,  Jul 25, 2009 2:44 PM

## 2010-04-14 NOTE — Medication Information (Signed)
Summary: Tracleer/[facility]  [facility]acleer/[facility]   Imported By: Bubba Hales 08/20/2009 08:28:28  _____________________________________________________________________  External Attachment:    Type:   Image     Comment:   External Document

## 2010-04-14 NOTE — Letter (Signed)
Summary:  Pulmonary Results Follow Up Letter  [facility]  [address]   [place], [place] [address]   Phone: [phone]  Fax: [phone]    04/02/2009 MRN: [id number]  [facility] [place], [place]  [address]  Dear Ms. Visconti,  We have received the results from your recent tests and have been unable to contact you.  Please call our office at [phone] so that Dr.___Byrum________ or his nurse may review the results with you.    Thank you,  Therapist, [facility]

## 2010-04-14 NOTE — Medication Information (Signed)
Summary: [name]  Anticoagulant Therapy  Managed by: Freddrick March, RN, BSN Referring MD: Baltazar Apo PCP: Ellsworth Lennox Supervising MD: Angelena Form MD, Harrell Gave Indication 1: Pulmonary Hypertension (dx code) (ICD-402.10) Lab Used: [place] Site: [facility] INR POC 1.1 INR RANGE 2 - 3  Dietary changes: no    Health status changes: no    Bleeding/hemorrhagic complications: no    Recent/future hospitalizations: no    Any changes in medication regimen? no    Recent/future dental: no  Any missed doses?: yes     Details: Off coumadin since 09/18/09.  Is patient compliant with meds? yes      Comments: Pt having epidural today.   Allergies: 1)  ! Codeine 2)  ! * Ivp Dye  Anticoagulation Management History:      The patient is taking warfarin and comes in today for a routine follow up visit.  Positive risk factors for bleeding include presence of serious comorbidities.  Negative risk factors for bleeding include an age less than 87 years old.  The bleeding index is 'intermediate risk'.  Positive CHADS2 values include History of CHF, History of HTN, and History of Diabetes.  Negative CHADS2 values include Age > 50 years old.  The start date was 05/18/2004.  Her last INR was 2.1 RATIO.  Anticoagulation responsible provider: Angelena Form MD, Harrell Gave.  INR POC: 1.1.  Cuvette Lot#: [id number].  Exp: 11/2010.    Anticoagulation Management Assessment/Plan:      The patient's current anticoagulation dose is Warfarin sodium 2.5 mg tabs: Take as directed by coumadin clinic..  The target INR is 2 - 3.  The next INR is due 10/17/2009.  Anticoagulation instructions were given to patient.  Results were reviewed/authorized by Freddrick March, RN, BSN.  She was notified by Freddrick March RN.         Prior Anticoagulation Instructions: INR 1.1  Restart Coumadin when okay with MD.  Billy Fischer with 2 tablets x 1 day then resume same dose of 1 1/2 tablet every day except 2 tablets on Sunday and  Thursday.   Current Anticoagulation Instructions: INR 1.1  Resume coumadin after epidural injection today.  Recheck in 1 week after resumes coumadin.

## 2010-04-14 NOTE — Progress Notes (Signed)
Summary: rx  Phone Note From Pharmacy Call back at [phone]   Caller: Kirby Call For: byrum  Summary of Call: pt called and said her Tyvaso was only three times a day instead of 4 times per day.  Please clarify for pharmacy.  Their script states 4 times per day. Initial call taken by: Zigmund Gottron,  October 30, 2009 11:35 AM  Follow-up for Phone Call        Spoke with [facility]-told them our records indicate 4 times a day.Clayborne Dana CMA  October 30, 2009 12:00 PM

## 2010-04-14 NOTE — Op Note (Signed)
Summary: Endoscopy  NAME:  [name], [place]                 ACCOUNT NO.:  [id number]   MEDICAL RECORD NO.:  [id number]          PATIENT TYPE:  AMB   LOCATION:  ENDO                         FACILITY:  [facility]   PHYSICIAN:  Waverly Ferrari, M.D.    DATE OF BIRTH:  Aug 12, 1950   DATE OF PROCEDURE:  03/10/2006  DATE OF DISCHARGE:                               OPERATIVE REPORT   PROCEDURE:  Upper endoscopy with biopsy.   INDICATIONS:  Gastric ulcers.   ANESTHESIA:  Fentanyl 75 mcg, Versed 7 mg.   PROCEDURE:  With the patient mildly sedated in the left lateral  decubitus position, the Pentax Video endoscope was inserted and now  passed under direct vision through the esophagus which showed changes of  some irregularity of the squamocolumnar junction, which were  photographed and biopsied.  We entered into the stomach fundus, body  appeared normal.  The antrum showed some erosions that were biopsied,  but very superficial duodenal bulb, the second portion duodenum appeared  normal.  From this point the endoscope was slowly withdrawn taking  circumferential views of duodenal mucosa until the endoscope was then  pulled back into the stomach and placed in retroflexion to view the  stomach from below.  The endoscope was then straightened and withdrawn,  taking circumferential views of the remaining gastric and esophageal  mucosa.  The patient's vital sign, __________, pulse oximetry remained  stable.  The patient tolerated the procedure well without apparent  complications.   FINDINGS:  Very superficial erosions of the antrum, question of  esophagitis versus Barrett's esophagus.  Biopsies were taken and await  biopsy report.  The patient will call me for results and follow up with  me as an outpatient.           ______________________________  Waverly Ferrari, M.D.     GMO/MEDQ  D:  03/10/2006  T:  03/10/2006  Job:  [id number]

## 2010-04-14 NOTE — Medication Information (Signed)
Summary: Pregnancy test/[facility]  Pregnancy test/[facility]   Imported By: Phillis Knack 07/16/2009 11:53:31  _____________________________________________________________________  External Attachment:    Type:   Image     Comment:   External Document

## 2010-04-14 NOTE — Progress Notes (Signed)
Summary: [facility] appt with Dr. Lamonte Sakai  Phone Note Outgoing Call   Call placed by: Francesca Jewett CMA,  Jul 24, 2009 3:22 PM Call placed to: Patient Summary of Call: Patient NOS appt today with Dr. Lamonte Sakai and needs to reschedule for follow-up.  LMOMTCB. Initial call taken by: Francesca Jewett CMA,  Jul 24, 2009 3:24 PM  Follow-up for Phone Call        [facility].Francesca Jewett CMA  Jul 25, 2009 4:07 PM  Pt is rsc for Monday, May 23 with RB. Follow-up by: Francesca Jewett CMA,  Jul 28, 2009 12:31 PM

## 2010-04-14 NOTE — Assessment & Plan Note (Signed)
Summary: dyspnea, cough   Visit Type:  Acute visit Copy to:  Hennie Duos, MD  Primary Provider/Referring Provider:  Ellsworth Lennox, MD   CC:  Acute visit...the patient c/o increased SOB with exertion and at rest...prod cough with green mucus...wheezing...chest tight.  History of Present Illness: [name] is a 60 year old woman with scleroderma and severe secondary pulmonary hypertension.  She had been treated with bosentan + Coumadin. Was showing decrease in 6 minute walk, worsening PASP on TTE so initiated Ventavis. Was unable to tolerate, although felt clinically stable. Also unable to use Adcirca due to HA. Finally started on Tyvaso, had tolerated but not up to full dosing due to compliance.    Her PASP has improved from 158mHg (7/09) to 428mg (9/10)!!   6 minute walk improved to 32164m1/2010). On coumadin, lasix 70m75mo times a day   ROV 10/22/09 -- PAH in setting scleroderma. On bosentan and Tyvaso. Having problems with R shoulder, evaluated by Dr DudaSharol Givenrotator cuff injury vs C spine DJD. Taking Tyvaso 9 puffs three times a day reliably since last visit. Has helped her breathing and exertional tolerance. Discussed increaseing to the recommended qid today.   ROV 01/29/10 -- scleroderma, severe PAH on Tracleer, coumadin, Tyvaso. Presents with acute onset DOE (walking the dog), then onset of cough that is exacerbated by taking her Tyvaso. Has a HA, some fatigue. Her granddaughter had a recent URI. Minor nasal gtt or congestion. Cough was initially clear, now bringing up dark green. Has been having some wheeze and SOB when she takes her inhaled Tyvaso. She just finished a 7 day course ampicillin for GI bacterial overgrowth (Dr KaplDeatra Ina Current Medications (verified): 1)  Lisinopril 10 Mg Tabs (Lisinopril) .... Marland Kitchen Tab Once Daily 2)  Digoxin 0.125 Mg Tabs (Digoxin) .... Marland Kitchen Tab Once Daily 3)  Metolazone 2.5 Mg Tabs (Metolazone) .... Marland Kitchen Tab Every Am 4)  Metoclopramide Hcl 10 Mg Tabs  (Metoclopramide Hcl) .... 1/2 Half Tablet Before Meals and At Bedtime 5)  Warfarin Sodium 2.5 Mg Tabs (Warfarin Sodium) .... Take As Directed By Coumadin Clinic. 6)  Prednisone 5 Mg Tabs (Prednisone) .... Marland Kitchen Tab Two Times A Day 7)  Furosemide 20 Mg Tabs (Furosemide) .... 2 Tabs Once Daily 8)  Lyrica 150 Mg Caps (Pregabalin) .... Marland Kitchen Tab Three Times A Day 9)  Hydrocodone-Acetaminophen 10-650 Mg Tabs (Hydrocodone-Acetaminophen) .... Marland Kitchen Tab Four Times A Day As Needed For Pain 10)  Glucophage 500 Mg Tabs (Metformin Hcl) .... Marland Kitchen By Mouth Two Times A Day With Meals 11)  Premarin 0.625 Mg Tabs (Estrogens Conjugated) .... Marland Kitchen By Mouth Daily 12)  Amaryl 4 Mg Tabs (Glimepiride) .... Marland Kitchen By Mouth Every Am and 1/2 Tab Every Pm With Meals 13)  Tracleer 125 Mg Tabs (Bosentan) .... Take 1 Tablet By Mouth Two Times A Day 14)  Onetouch Ultrasoft Lancets   Misc (Lancets) .... Use T Ocheck Blood Glucose 2-3 Times A Day 15)  Nexium 40 Mg Cpdr (Esomeprazole Magnesium) .... Marland Kitchen By Mouth Two Times A Day 16)  Tyvaso Starter 0.6 Mg/ml Soln (Treprostinil) .... Four Times A Day 17)  Excedrin Migraine 250-250-65 Mg Tabs (Aspirin-Acetaminophen-Caffeine) .... As Needed  Allergies (verified): 1)  ! Codeine 2)  ! * Ivp Dye  Vital Signs:  Patient profile:   60 y29r old female Height:      64 inches (162.56 cm) Weight:      223.38 pounds (101.54 kg) BMI:     38.48 O2 Sat:  92 % on Room air Temp:     98.1 degrees F (36.72 degrees C) oral Pulse rate:   96 / minute BP sitting:   126 / 80  (left arm) Cuff size:   large  Vitals Entered By: [name] CMA (January 29, 2010 9:04 AM)  O2 Sat at Rest %:  92 O2 Flow:  Room air CC: Acute visit...the patient c/o increased SOB with exertion and at rest...prod cough with green mucus...wheezing...chest tight Comments Medications reviewed with patient [name] CMA  January 29, 2010 9:05 AM   Physical Exam  General:  uncomfortable, ill appearing, and obese.   Head:   normocephalic and atraumatic Nose:  no deformity, discharge, inflammation, or lesions Mouth:  poor dentition.  otherwise normal. No posterior pharyngeal erythema Neck:  no masses, thyromegaly, or abnormal cervical nodes Lungs:  focal exp wheeze on L, and B with cough Heart:  regular, normal s1, loud s2. 2/6 M at end systole Abdomen:  not examined Msk:  Difficulty raising r shoulder due to pain Extremities:  trace edema Skin:  no significant sclerodactyly Psych:  alert and cooperative; normal mood and affect; normal attention span and concentration   X-ray  Procedure date:  01/29/2010  Findings:      Mild B LL interstitial markings, no new infiltrates  Impression & Recommendations:  Problem # 1:  ASTHMATIC BRONCHITIS, ACUTE (ICD-466.0) - CXR now, no evidence CAP - just finished a course of ampicillin; hesitate to give her a quinolone while on coumadin, will use 5 days azithro - Will lneed pred taper.  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   Orders: Est. 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 Could consider decreasing the number of puffs or freq temporarily until this process clears.  - bosentan - coumadin with checks at [facility]  Orders: Est. Patient Level IV (44967)  Medications Added to Medication List This Visit: 1)  Prednisone 10 Mg Tabs (Prednisone) .... 12m daily x 3days, 357mdaily x 3days, 2058maily x 3days, 81m43mily x3 days then stop. 2)  Azithromycin 250 Mg Tabs (Azithromycin) .... Take 2 by mouth on first day, then 1 by mouth once daily until  gone.  Other Orders: T-2 View CXR ([id number] Instructions: 1)  Continue your Tracleer two times a day  2)  Continue your coumadin (with plans to stop 5 days before procedure with Dr KaplDeatra Ina  Decrease your Tyvaso to 6 puffs three times a day for the next week, then go back to 9 puffs 4x a day.  4)  Take prednisone as directed 5)  Take azithromycin x 5 days 6)  Follow up with Dr ByruLamonte Sakaion Dec 8 as planned.  7)  Call us iKoreayou are not improving on this regimen Prescriptions: AZITHROMYCIN 250 MG TABS (AZITHROMYCIN) take 2 by mouth on first day, then 1 by mouth once daily until gone.  #6 x 0   Entered and Authorized by:   RobeCollene Gobble  Signed by:   RobeCollene Gobbleon 01/29/2010   Method  used:   Electronically to        [facility] (retail)       [place], [place]  [address]       Ph: [phone]       Fax: [phone]   RxID:   [id number] PREDNISONE 10 MG TABS (PREDNISONE) 77m daily x 3days, 343mdaily x 3days, 2060maily x 3days, 5m29mily x3 days then stop.  #30 x 0   Entered and Authorized by:   RobeCollene Gobble  Signed by:   RobeCollene Gobbleon 01/29/2010   Method used:   [phone]       [address]   [place]  [id number]   Ph: [phone]   Fax: [phone]:   [id number]

## 2010-04-14 NOTE — Progress Notes (Signed)
Summary: letter  Phone Note Call from Patient   Caller: Patient Call For: byrum Summary of Call: pt need to have teeth extracted need to know how long she need to be off of coumadin. she need a letter if possible Initial call taken by: Gustavus Bryant,  September 29, 2009 2:45 PM  Follow-up for Phone Call        [facility].  Matthew Folks LPN  September 29, 9209 9:41 PM    lmomtcb Elita Boone [facility]  September 29, 2009 4:57 PM   Additional Follow-up for Phone Call Additional follow up Details #1::        pt stated that she was going today but she had to reschedule---she is not sure when she will go now due to some things she needs to take care of ---going to [facility] and needs a letter stating how long she needs to be off of the coumadin and when to restart.  please advise. thanks [place]  September 30, 2009 9:25 AM     Additional Follow-up for Phone Call Additional follow up Details #2::    called spoke with patient regarding 7.21.11 message that she left.  advised patient that we are still working on the letter that she requested.  per patient, may disregard this message - she had wanted to have this done this week, but will now be unable to.  i asked patient if she would still like the letter or to call when she reschedules the appt to have her teeth extracted.  patient states she prefers to call again when she has rescheduled.  i advised patient to call for the letter re: her coumadin about a week before the procedure to ensure that she gets it in time.  pt agreed and verbalized her understanding. Follow-up by: Parke Poisson CNA/MA,  October 02, 2009 10:12 AM

## 2010-04-14 NOTE — Letter (Signed)
Summary: [facility]   Imported By: Phillis Knack 01/26/2010 10:29:02  _____________________________________________________________________  External Attachment:    Type:   Image     Comment:   External Document

## 2010-04-14 NOTE — Assessment & Plan Note (Signed)
Summary: PAH, scleroderma   Visit Type:  Follow-up Primary Provider/Referring Provider:  Ellsworth Lennox  CC:  PAH.  Scleroderma.  On Tracleer and Tyvaso. the patient says there is no change better or worse in her breathing. Patient is using Tyvaso 3 puffs  three times daily.Marland Kitchen  History of Present Illness: [name] is a 60 year old woman with scleroderma and severe secondary pulmonary hypertension.  She had been treated with bosentan + Coumadin. Was showing decrease in 6 minute walk, worsening PASP on TTE so initiated Ventavis. Was unable to tolerate, although felt clinically stable. Also unable to use Adcirca due to HA. Finally started on Tyvaso and now titrated up to full dosing.    ROV 12/09/08 -- Returns to discuss TTE results, see below. Her PASP has improved from 149mHg (7/09) to 462mg (9/10)!! She feels much better, exercise tolerance is great. No more chest tightness. Tolerating Tyvaso - no HA, no syncope or pre-syncope.   ROV 02/10/09 -- Continues to feel well. Tolerating Tyvaso, Tracleer. 6 minute walk improved to 32184m1/2010), correlates with improved TTE results. No new problems.   ROV 04/25/09 -- returns for f/u today. Has been taking care of her mother for most of the day. She has been tired more. Having more difficulty taking her Tyvaso on the desired schedule. Has been taking it only intermittantly - there have been days that she skips it, then goes back to full dosing 3 -4 days later. When she restarts she has predicatble HA, dizziness, weakness.   ROV 08/04/09 -- scheduled ROV for PAH in setting scleroderma. Maintained on Bosentan and Tyvaso. Tells me that she is feeling well. Exertional tolerance is good, rarely stops to rest due to breathing. She has been taking the Tyvaso about three times a day, about 5 days a week, 9 puffs. Last walk and TTE as above.  On coumadin, lasix 73m75mo times a day   ROV 10/22/09 -- PAH in setting scleroderma. On bosentan and Tyvaso. Having  problems with R shoulder, evaluated by Dr DudaSharol Givenrotator cuff injury vs C spine DJD. Taking Tyvaso 9 puffs three times a day reliably since last visit. Has helped her breathing and exertional tolerance. Discussed increaseing to the recommended qid today.   Current Medications (verified): 1)  Lisinopril 10 Mg Tabs (Lisinopril) .... Marland Kitchen Tab Once Daily 2)  Digoxin 0.125 Mg Tabs (Digoxin) .... Marland Kitchen Tab Once Daily 3)  Metolazone 2.5 Mg Tabs (Metolazone) .... Marland Kitchen Tab Every Am 4)  Metoclopramide Hcl 10 Mg Tabs (Metoclopramide Hcl) .... 1/2 Half Tablet Before Meals and At Bedtime 5)  Warfarin Sodium 2.5 Mg Tabs (Warfarin Sodium) .... Take As Directed By Coumadin Clinic. 6)  Prednisone 5 Mg Tabs (Prednisone) .... Marland Kitchen Tab Two Times A Day 7)  Furosemide 20 Mg Tabs (Furosemide) .... 2 Tabs Once Daily 8)  Lyrica 150 Mg Caps (Pregabalin) .... Marland Kitchen Tab Three Times A Day 9)  Hydrocodone-Acetaminophen 10-650 Mg Tabs (Hydrocodone-Acetaminophen) .... Marland Kitchen Tab Four Times A Day As Needed For Pain 10)  Glucophage 500 Mg Tabs (Metformin Hcl) .... Marland Kitchen By Mouth Two Times A Day With Meals 11)  Premarin 0.625 Mg Tabs (Estrogens Conjugated) .... Marland Kitchen By Mouth Daily 12)  Amaryl 4 Mg Tabs (Glimepiride) .... Marland Kitchen By Mouth Every Am and 1/2 Tab Every Pm With Meals 13)  Tracleer 125 Mg Tabs (Bosentan) .... Take 1 Tablet By Mouth Two Times A Day 14)  Onetouch Ultrasoft Lancets   Misc (Lancets) .... Use T Ocheck Blood Glucose 2-3  Times A Day 15)  Nexium 40 Mg Cpdr (Esomeprazole Magnesium) .Marland Kitchen.. 1 By Mouth Two Times A Day 16)  Tyvaso Starter 0.6 Mg/ml Soln (Treprostinil) .... Four Times A Day  Allergies (verified): 1)  ! Codeine 2)  ! * Ivp Dye  Vital Signs:  Patient profile:   60 year old female Height:      64 inches (162.56 cm) Weight:      211 pounds (95.91 kg) BMI:     36.35 O2 Sat:      90 % on Room air Temp:     98.2 degrees F (36.78 degrees C) oral Pulse rate:   84 / minute BP sitting:   120 / 58  (right arm) Cuff size:    large  Vitals Entered By: Francesca Jewett CMA (October 22, 2009 11:31 AM)  O2 Sat at Rest %:  90 O2 Flow:  Room air CC: PAH.  Scleroderma.  On Tracleer and Tyvaso. the patient says there is no change better or worse in her breathing. Patient is using Tyvaso 3 puffs  three times daily. Comments Medications reviewed with the patient. Daytime phone verified. Francesca Jewett CMA  October 22, 2009 11:32 AM   Physical Exam  General:  normal appearance, healthy appearing, and obese.   Head:  normocephalic and atraumatic Nose:  no deformity, discharge, inflammation, or lesions Mouth:  poor dentition.  otherwise normal. No posterior pharyngeal erythema Neck:  no masses, thyromegaly, or abnormal cervical nodes Lungs:  bibasilar soft insp crackles, no wheezes Heart:  regular, normal s1, loud s2. 2/6 M at end systole Abdomen:  not examined Msk:  Difficulty raising r shoulder due to pain Extremities:  trace edema Skin:  no significant sclerodactyly Psych:  alert and cooperative; normal mood and affect; normal attention span and concentration b  Impression & Recommendations:  Problem # 1:  PULMONARY HYPERTENSION, SECONDARY (ICD-416.8)  Orders: Est. Patient Level IV (09323)  Problem # 2:  SCLERODERMA (ICD-710.1)  Orders: Est. Patient Level IV (55732)  Patient Instructions: 1)  Continue your bosentan 168m two times a day  2)  We will decrease the frequency of your LFT' sto every 3 months. You don't need this checked again until OCTOBER.  3)  Continue you tyvaso 9 puffs three times a day. My goal is to get you to 12 puffs 4x a day whe you believe you can do this.  4)  Continue your coumadin.  5)  Continue your Prednisone 5 mg two times a day  6)  Follow up with Dr BLamonte Sakaiin November 2011. You will need a 6 minute walk before that visit.

## 2010-04-14 NOTE — Progress Notes (Signed)
Summary: tracleer  Phone Note From Other Clinic   Caller: Patient Call For: byrum Caller: cindy w/ [facility] Call For: byrum Summary of Call: needs refill rx tracleer (verbal will be fine). cindy [phone] Initial call taken by: Cooper Render, CNA,  Jul 31, 2009 10:46 AM  Follow-up for Phone Call        Verbal order given for pt's Tracleer 125m #60, 1 by mouth two times a day with 11 refills. Hard copy in RB's look at folder awaiting his signature. Follow-up by: LFrancesca JewettCMA,  Jul 31, 2009 10:57 AM

## 2010-04-14 NOTE — Progress Notes (Signed)
Summary: order  Phone Note From Pharmacy Call back at [phone]  x [phone]   Caller: [facility] [place] Call For: Byrum  Summary of Call: Need verbal to ship pt's Tyvaso.  Pt will be out of med tomorrow. Initial call taken by: Zigmund Gottron,  November 04, 2009 3:29 PM  Follow-up for Phone Call        Spoke to [facility] and they were needing a verbal to ship pt tyvaso. According to pt last ov note and med list Dr. Lamonte Sakai wnated her to increse to 4 timesa a day as she tolerated, soI advised pharmay of this and this is how they will write rx and ship. He wrote it as take 3-12 puffs 3-4 times daily as tolerated per MD x 1 month supply. They will ship asap because pt runs out of meds tomorrow.  [place] Bing CMA  November 04, 2009 4:01 PM

## 2010-04-14 NOTE — Letter (Signed)
Summary: Anticoagulation/[facility]  Anticoagulation/[facility]   Imported By: Phillis Knack 02/03/2010 07:47:20  _____________________________________________________________________  External Attachment:    Type:   Image     Comment:   External Document

## 2010-04-14 NOTE — Letter (Signed)
Summary: Results Letter  [facility]  [place], [place] [address]   Phone: [phone]  Fax: [phone]        January 21, 2010 MRN: [id number]    [name] [address] [place], [place]  [address]    Dear Ms. Canterbury,  It is my pleasure to have treated you recently as a new patient in my office. I appreciate your confidence and the opportunity to participate in your care.  Since I do have a busy inpatient endoscopy schedule and office schedule, my office hours vary weekly. I am, however, available for emergency calls everyday through my office. If I am not available for an urgent office appointment, another one of our gastroenterologist will be able to assist you.  My well-trained staff are prepared to help you at all times. For emergencies after office hours, a physician from our Gastroenterology section is always available through my 24 hour answering service  Once again I welcome you as a new patient and I look forward to a happy and healthy relationship             Sincerely,  Inda Castle MD  This letter has been electronically signed by your physician.  Appended Document: Results Letter letter mailed

## 2010-04-14 NOTE — Medication Information (Signed)
Summary: [name] / [facility]  Tyvaso / [facility]   Imported By: Rise Patience 11/25/2009 16:40:30  _____________________________________________________________________  External Attachment:    Type:   Image     Comment:   External Document

## 2010-04-14 NOTE — Assessment & Plan Note (Signed)
Summary: PAH, scleroderma   Visit Type:  Follow-up Copy to:  Hennie Duos, MD  Primary Provider/Referring Provider:  Ellsworth Lennox, MD   CC:  Followup cough and dyspnea.  Pt states cough has resolved and breathing back at her norm basline.  No complaints today.Marland Kitchen  History of Present Illness: [name] is a 60 year old woman with scleroderma and severe secondary pulmonary hypertension.  She had been treated with bosentan + Coumadin. Was showing decrease in 6 minute walk, worsening PASP on TTE so initiated Ventavis. Was unable to tolerate, although felt clinically stable. Also unable to use Adcirca due to HA. Finally started on Tyvaso, had tolerated but not up to full dosing due to compliance.    Her PASP has improved from 162mHg (7/09) to 49mg (9/10)!!   6 minute walk improved to 32169m1/2010). On coumadin, lasix 17m50mo times a day   ROV 10/22/09 -- PAH in setting scleroderma. On bosentan and Tyvaso. Having problems with R shoulder, evaluated by Dr DudaSharol Givenrotator cuff injury vs C spine DJD. Taking Tyvaso 9 puffs three times a day reliably since last visit. Has helped her breathing and exertional tolerance. Discussed increaseing to the recommended qid today.   ROV 01/29/10 -- scleroderma, severe PAH on Tracleer, coumadin, Tyvaso. Presents with acute onset DOE (walking the dog), then onset of cough that is exacerbated by taking her Tyvaso. Has a HA, some fatigue. Her granddaughter had a recent URI. Minor nasal gtt or congestion. Cough was initially clear, now bringing up dark green. Has been having some wheeze and SOB when she takes her inhaled Tyvaso. She just finished a 7 day course ampicillin for GI bacterial overgrowth (Dr KaplDeatra Ina ROV 02/19/10 -- scleroderma, severe PAH. last time we decreased Tyvaso freq due to cough - she tells me that she stopped it altogether for a few days. The cough resolved and she is back on it. Taking the Tyvaso now 3 -4x a day, 9 puffs. Still coughs some  when she takes. LFTs normal in October.   Current Medications (verified): 1)  Lisinopril 10 Mg Tabs (Lisinopril) .... Marland Kitchen Tab Once Daily 2)  Digoxin 0.125 Mg Tabs (Digoxin) .... Marland Kitchen Tab Once Daily 3)  Metolazone 2.5 Mg Tabs (Metolazone) .... Marland Kitchen Tab Every Am 4)  Metoclopramide Hcl 10 Mg Tabs (Metoclopramide Hcl) .... 1/2 Half Tablet Before Meals and At Bedtime 5)  Warfarin Sodium 2.5 Mg Tabs (Warfarin Sodium) .... Take As Directed By Coumadin Clinic. 6)  Prednisone 5 Mg Tabs (Prednisone) .... Marland Kitchen Tab Two Times A Day 7)  Furosemide 20 Mg Tabs (Furosemide) .... 2 Tabs Once Daily 8)  Lyrica 150 Mg Caps (Pregabalin) .... Marland Kitchen Tab Three Times A Day 9)  Hydrocodone-Acetaminophen 10-650 Mg Tabs (Hydrocodone-Acetaminophen) .... Marland Kitchen Tab Four Times A Day As Needed For Pain 10)  Glucophage 500 Mg Tabs (Metformin Hcl) .... Marland Kitchen By Mouth Two Times A Day With Meals 11)  Premarin 0.625 Mg Tabs (Estrogens Conjugated) .... Marland Kitchen By Mouth Daily 12)  Amaryl 4 Mg Tabs (Glimepiride) .... Marland Kitchen By Mouth Every Am and 1/2 Tab Every Pm With Meals 13)  Tracleer 125 Mg Tabs (Bosentan) .... Take 1 Tablet By Mouth Two Times A Day 14)  Onetouch Ultrasoft Lancets   Misc (Lancets) .... Use T Ocheck Blood Glucose 2-3 Times A Day 15)  Nexium 40 Mg Cpdr (Esomeprazole Magnesium) .... Marland Kitchen By Mouth Two Times A Day 16)  Tyvaso Starter 0.6 Mg/ml Soln (Treprostinil) .... Four Times A Day 17)  Excedrin Migraine 250-250-65 Mg Tabs (Aspirin-Acetaminophen-Caffeine) .... As Needed  Allergies (verified): 1)  ! Codeine 2)  ! * Ivp Dye  Vital Signs:  Patient profile:   60 year old female Weight:      217 pounds O2 Sat:      93 % on Room air Temp:     97.7 degrees F oral Pulse rate:   88 / minute BP sitting:   128 / 62  (left arm) Cuff size:   large  Vitals Entered By: Tilden Dome (February 19, 2010 10:30 AM)  O2 Flow:  Room air  Physical Exam  General:  uncomfortable, ill appearing, and obese.   Head:  normocephalic and atraumatic Nose:  no  deformity, discharge, inflammation, or lesions Mouth:  poor dentition.  otherwise normal. No posterior pharyngeal erythema Neck:  no masses, thyromegaly, or abnormal cervical nodes Lungs:  clear B Heart:  regular, normal s1, loud s2. 2/6 M at end systole Abdomen:  not examined Msk:  Difficulty raising r shoulder due to pain Extremities:  trace edema Skin:  no significant sclerodactyly Psych:  alert and cooperative; normal mood and affect; normal attention span and concentration   Impression & Recommendations:  Problem # 1:  PULMONARY HYPERTENSION, SECONDARY (ICD-416.8)  - no change meds except planned brak in coumadin for procedure with Dr Deatra Ina  Orders: Est. Patient Level IV (55015)  Problem # 2:  SCLERODERMA (ICD-710.1) - pred stable dose  Problem # 3:  COUGH (ICD-786.2)  Suspect this is related to her Tyvaso, doesn;t seem to be as bad as before. her wheezing has resolved. Would like to continue Tyvaso if at all possible, she believes that this is tolerable.   Orders: Est. Patient Level IV (86825)  Patient Instructions: 1)  Continue your Tracleer 2)  [facility] 3)  Increase your Tyvaso to 9 puffs 4x a day 4)  Continue your coumadin as directed by [employer]. Stop it as directed in preparation for your procedure with Dr Deatra Ina 5)  Continue your Prednisone.  6)  Follow up with Dr Lamonte Sakai in 3 months or as needed

## 2010-04-14 NOTE — Medication Information (Signed)
Summary: Tracleer/[facility]  Tracleer/[facility]   Imported By: Phillis Knack 08/18/2009 11:34:51  _____________________________________________________________________  External Attachment:    Type:   Image     Comment:   External Document

## 2010-04-14 NOTE — Progress Notes (Signed)
Summary: refill  Phone Note From Pharmacy   Caller: [facility] [phone] ext [phone] -luke Call For: byrum  Summary of Call: pt needs labs drawn for traclear is it ok to refill meds Initial call taken by: Don Broach,  November 24, 2009 5:28 PM  Follow-up for Phone Call        Spoke with Lurena Joiner at [employer] [facility].  He states that pt has not had LFT's since July, and that they require this every month.  I advised that per last ov note on 10/22/09 RB told pt that she could do labs every 3 months, so she is due next in Oct.  She states that this is fine and they will go ahead and send pt her meds. Follow-up by: Tilden Dome,  November 24, 2009 5:37 PM

## 2010-04-14 NOTE — Progress Notes (Signed)
Summary: cough  Phone Note Call from Patient Call back at [facility] Phone [phone]   Caller: Patient Call For: byrum Reason for Call: Talk to Nurse Summary of Call: Patient states that her medicine that she takes for "her breathing" is making her cough.  Patient does not know name of med. and is asking what she can do because she is having a hard time breathing from coughing. Initial call taken by: Mateo Flow,  January 27, 2010 1:35 PM  Follow-up for Phone Call        Called and spoke with pt.  She states that she has had cough x 1 wk- prod sometimes with "clear liquids".  She states that she usually only notices the cough after using tyvaso.  Wants to know if this is a s/e of this med.  She has followup with RB, but not until Dec.  Pls advise thanks! Follow-up by: Tilden Dome,  January 27, 2010 2:16 PM  Additional Follow-up for Phone Call Additional follow up Details #1::        Cough is a side effect of Tyvaso, but she had been tolerating three times a day for a long time (if she was taking it). Not clear to me that this is true side effect. She has scleroderma and is at risk for swallowing problems, aspiration (although has not had trouble w this before to my knowledge). We should see her sooner, overbook. DON'T stop the Tyvaso abruptly - could be dangerous, needs to be slowly withdrawn if stopped. Collene Gobble MD  January 27, 2010 3:35 PM  Additional Follow-up by: Collene Gobble MD,  January 27, 2010 3:35 PM    Additional Follow-up for Phone Call Additional follow up Details #2::    Spoke with pt and notified of the above recs per RB.  Pt verbalized understanding and states will continue to take med.  I sched her appt with RB for 01/29/10 at 9 am. Follow-up by: Tilden Dome,  January 27, 2010 3:53 PM

## 2010-04-14 NOTE — Progress Notes (Signed)
  Phone Note Outgoing Call   Call placed by: Tula Nakayama, RN, BSN,  December 05, 2009 10:16 AM Details for Reason: Past due CVRR appt Summary of Call: Telephoned pt and she states she has been sick, not in hospital but just hasn't felt well. Informed her that she missed a coumadin appt and attempted to scheduled for next week. She refuses to schedule an appt and states she will call to make it.  Initial call taken by: Tula Nakayama, RN, BSN,  December 05, 2009 10:29 AM

## 2010-04-14 NOTE — Progress Notes (Signed)
Summary: OV rescheduled w/ Dr. Lamonte Sakai  Phone Note Outgoing Call   Call placed by: Francesca Jewett CMA,  June 19, 2009 8:56 AM Call placed to: Patient Summary of Call: Patient NOS her appt on 06/18/2009 with Dr. Lamonte Sakai. Pt needs to schedule an appt. LMOMTCB. Initial call taken by: Francesca Jewett CMA,  June 19, 2009 8:57 AM  Follow-up for Phone Call        PT RETURNED CALL. SHE HAS RSC'D W/ RB FOR 07/11/09. Marland KitchenSIGN   This is fine.Francesca Jewett CMA  June 20, 2009 9:22 AM

## 2010-04-14 NOTE — Medication Information (Signed)
Summary: Tracleer / [facility]   Tracleer / [facility]   Imported By: Rise Patience 10/21/2009 15:28:12  _____________________________________________________________________  External Attachment:    Type:   Image     Comment:   External Document

## 2010-04-14 NOTE — Progress Notes (Signed)
Summary: Medication Question   Phone Note Call from Patient Call back at Home Phone [phone]   Caller: Patient Call For: Dr. Deatra Ina Details for Reason: Medication Question Summary of Call: Pt. states the ampicillin makes her cough. Is there anything else she can take? Initial call taken by: Darliss Ridgel,  January 27, 2010 9:57 AM  Follow-up for Phone Call        Per the office note from 01/21/10 patient to have a "  7 day trial"  of Ampicillin.  She says she has had an improvement in her gas and bloating, but it causes her to cough.  She is requesting an alternate antibiotic.  Dr Deatra Ina she was prescribed #28 with 1 refill.  Did you plan for her to continue on rx?.  She is advised to finish the 7 days and we will call her back once Dr Deatra Ina has a chance to review and advise next week when he returns. Follow-up by: Barb Merino RN, [place],  January 27, 2010 10:58 AM  Additional Follow-up for Phone Call Additional follow up Details #1::        can d/c antibiotic Additional Follow-up by: Inda Castle MD,  February 01, 2010 8:20 PM    Additional Follow-up for Phone Call Additional follow up Details #2::    Patient  advised of Dr Kelby Fam recommendations. Follow-up by: Barb Merino RN, [place],  February 02, 2010 1:04 PM

## 2010-04-14 NOTE — Medication Information (Signed)
Summary: Tracleer/[facility]  Tracleer/[facility]   Imported By: Phillis Knack 08/07/2009 14:49:17  _____________________________________________________________________  External Attachment:    Type:   Image     Comment:   External Document

## 2010-04-14 NOTE — Assessment & Plan Note (Signed)
Summary: pulm HTN   Visit Type:  Follow-up Primary Provider/Referring Provider:  Ellsworth Lennox  CC:  3 month PAH follow-up. The patient says there is no change in her breathing. No better or worse.  She did admit that she is not using her Tyvaso four times daily. At the most she uses it 2 or 3 times daily.Marland Kitchen  History of Present Illness: [name] is a 60 year old woman with scleroderma and severe secondary pulmonary hypertension.  She had been treated with bosentan + Coumadin. Was showing decrease in 6 minute walk, worsening PASP on TTE so initiated Ventavis. Was unable to tolerate, although felt clinically stable. Also unable to use Adcirca due to HA. Finally started on Tyvaso and now titrated up to full dosing.    ROV 12/09/08 -- Returns to discuss TTE results, see below. Her PASP has improved from 129mHg (7/09) to 482mg (9/10)!! She feels much better, exercise tolerance is great. No more chest tightness. Tolerating Tyvaso - no HA, no syncope or pre-syncope.   ROV 02/10/09 -- Continues to feel well. Tolerating Tyvaso, Tracleer. 6 minute walk improved to 32161m1/2010), correlates with improved TTE results. No new problems.   ROV 04/25/09 -- returns for f/u today. Has been taking care of her mother for most of the day. She has been tired more. Having more difficulty taking her Tyvaso on the desired schedule. Has been taking it only intermittantly - there have been days that she skips it, then goes back to full dosing 3 -4 days later. When she restarts she has predicatble HA, dizziness, weakness.   Current Medications (verified): 1)  Lisinopril 10 Mg Tabs (Lisinopril) ....Marland Kitchen1 Tab Once Daily 2)  Digitek  Tabs (Digoxin Tabs) ....Marland Kitchen1 Tab Every Morning 3)  Metolazone 2.5 Mg Tabs (Metolazone) ....Marland Kitchen1 Tab Every Am 4)  Metoclopramide Hcl 10 Mg Tabs (Metoclopramide Hcl) .... 1/2 Half Tablet Before Meals and At Bedtime 5)  Warfarin Sodium 2.5 Mg Tabs (Warfarin Sodium) .... Take As Directed By Coumadin  Clinic. 6)  Prednisone 5 Mg Tabs (Prednisone) ....Marland Kitchen1 Tab Two Times A Day 7)  Furosemide 20 Mg Tabs (Furosemide) .... 2 Tabs Once Daily 8)  Lyrica 150 Mg Caps (Pregabalin) ....Marland Kitchen1 Tab Three Times A Day 9)  Hydrocodone-Acetaminophen 10-650 Mg Tabs (Hydrocodone-Acetaminophen) ....Marland Kitchen1 Tab Four Times A Day As Needed For Pain 10)  Glucophage 500 Mg Tabs (Metformin Hcl) ....Marland Kitchen1 By Mouth Two Times A Day With Meals 11)  Premarin 0.625 Mg Tabs (Estrogens Conjugated) ....Marland Kitchen1 By Mouth Daily 12)  Amaryl 4 Mg Tabs (Glimepiride) ....Marland Kitchen1 By Mouth Every Am and 1/2 Tab Every Pm With Meals 13)  Tracleer 125 Mg Tabs (Bosentan) .... Take 1 Tablet By Mouth Two Times A Day 14)  Onetouch Ultrasoft Lancets   Misc (Lancets) .... Use T Ocheck Blood Glucose 2-3 Times A Day 15)  Nexium 40 Mg Cpdr (Esomeprazole Magnesium) ....Marland Kitchen1 By Mouth Two Times A Day 16)  Tyvaso Starter 0.6 Mg/ml Soln (Treprostinil) .... Four Times A Day  Allergies (verified): 1)  ! Codeine 2)  ! * Ivp Dye  Vital Signs:  Patient profile:   58 11ar old female Height:      64 inches (162.56 cm) Weight:      214.50 pounds (97.50 kg) BMI:     36.95 O2 Sat:      90 % on Room air Temp:     98.4 degrees F (36.89 degrees C) oral Pulse rate:   88 /  minute BP sitting:   120 / 70  (left arm) Cuff size:   regular  Vitals Entered By: Francesca Jewett CMA (April 25, 2009 10:10 AM)  O2 Sat at Rest %:  90 O2 Flow:  Room air  Physical Exam  General:  normal appearance, healthy appearing, and obese.   Head:  normocephalic and atraumatic Nose:  no deformity, discharge, inflammation, or lesions Mouth:  poor dentition.  otherwise normal. No posterior pharyngeal erythema Neck:  no masses, thyromegaly, or abnormal cervical nodes Lungs:  clear B Heart:  regular, normal s1, loud s2. 2/6 M at end systole Abdomen:  not examined Extremities:  trace edema Skin:  no significant sclerodactyly Psych:  alert and cooperative; normal mood and affect; normal attention span  and concentration   Impression & Recommendations:  Problem # 1:  PULMONARY HYPERTENSION, SECONDARY (ICD-416.8)  Continue your Tracleer as you are taking it Restart your Tyvaso, 6 puffs three times a day. We will increase your dose when you are used to the med and when we are sure that you can stick to this schedule. Discussed compliance in detail with her today, the importance of NOT skipping days then restarting at random.  LFT every month Continue your coumadin ROV 1 month  Orders: Est. 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 Patient Level IV (91916)  Patient Instructions: 1)  Continue your Tracleer as you are taking it 2)  Restart your Tyvaso, 6 puffs three times a day. We will increase your dose when you are used to the med and when we are sure that you can stick to this schedule 3)  Bloodwork every month 4)  Continue your coumadin 5)  Follow up with Dr Lamonte Sakai in 1 month

## 2010-04-14 NOTE — Procedures (Signed)
Summary: Preparation for Endoscopy / [facility]  Preparation for Endoscopy / [facility]   Imported By: Rise Patience 01/23/2010 15:12:27  _____________________________________________________________________  External Attachment:    Type:   Image     Comment:   External Document

## 2010-04-14 NOTE — Medication Information (Signed)
Summary: ccr/ gd  Anticoagulant Therapy  Managed by: Freddrick March, RN, BSN Referring MD: Baltazar Apo PCP: Ellsworth Lennox Supervising MD: Olevia Perches MD, Jovonni Borquez Indication 1: Pulmonary Hypertension (dx code) (ICD-402.10) Lab Used: [place] Site: [facility] INR POC 2.2 INR RANGE 2 - 3  Dietary changes: no    Health status changes: no    Bleeding/hemorrhagic complications: no    Recent/future hospitalizations: yes       Details: Rotator cuff surgery R shoulder on 12/23/09  Any changes in medication regimen? no    Recent/future dental: no  Any missed doses?: no       Is patient compliant with meds? yes      Comments: Called Dr Jess Barters office and spoke with his RN, pt does not need to be off Coumadin prior to shoulder scope. Freddrick March RN  December 19, 2009 11:58 AM   Allergies: 1)  ! Codeine 2)  ! * Ivp Dye  Anticoagulation Management History:      The patient is taking warfarin and comes in today for a routine follow up visit.  Positive risk factors for bleeding include presence of serious comorbidities.  Negative risk factors for bleeding include an age less than 45 years old.  The bleeding index is 'intermediate risk'.  Positive CHADS2 values include History of CHF, History of HTN, and History of Diabetes.  Negative CHADS2 values include Age > 73 years old.  The start date was 05/18/2004.  Her last INR was 2.1 RATIO.  Anticoagulation responsible provider: Olevia Perches MD, Darnell Level.  INR POC: 2.2.  Cuvette Lot#: [id number].  Exp: 01/2011.    Anticoagulation Management Assessment/Plan:      The patient's current anticoagulation dose is Warfarin sodium 2.5 mg tabs: Take as directed by coumadin clinic..  The target INR is 2 - 3.  The next INR is due 01/16/2010.  Anticoagulation instructions were given to patient.  Results were reviewed/authorized by Freddrick March, RN, BSN.  She was notified by Freddrick March RN.         Prior Anticoagulation Instructions: INR 1.6  Take 2.5 tablets  today, then take 2 tablets tomorrow, then resume same dosage 1.5 tablets daily except 2 tablets on Sundays and Thursdays.  Recheck 3 weeks.    Current Anticoagulation Instructions: INR 2.2  Continue on same dosage 1.5 tablets daily except 2 tablets on Sundays and Thursdays.  Recheck in 4 weeks.

## 2010-04-14 NOTE — Progress Notes (Signed)
Summary: tracleer/ preg test  Phone Note From Other Clinic   Caller: joel w/ [facility] Call For: byrum Summary of Call: per caller joel- w/ [facility]- pt has not had the "required pregnancy test" so the tracleer can not be shipped for pt. call joel at [phone] Initial call taken by: Cooper Render, CNA,  July 02, 2009 12:50 PM  Follow-up for Phone Call        Spoke with Fara Olden at [facility] and advised that pt is not requring a preganancy test and it is okay to ship meds.  I explained that we have been through this last month and we have already faxed form to them.  He states that they did receive that form, but there is a new form from the [employer] that has to be signed by RB as well as the pt.  Per Cecille Rubin, form is in RB's lookat and will be taken care of at pt's next followup on 07/11/09. Follow-up by: Tilden Dome,  July 02, 2009 1:49 PM

## 2010-04-14 NOTE — Letter (Signed)
Summary: Diabetic Instructions  [facility]  [place], [place] [address]   Phone: [phone]  Fax: [phone]    [name] 1950-04-22 MRN: [id number]   x    ORAL DIABETIC MEDICATION INSTRUCTIONS  The day before your procedure:   Take your diabetic pill as you do normally  The day of your procedure:   Do not take your diabetic pill    We will check your blood sugar levels during the admission process and again in Recovery before discharging you home  ________________________________________________________________________  _  _   INSULIN (LONG ACTING) MEDICATION INSTRUCTIONS (Lantus, NPH, 70/30, Humulin, Novolin-N)   The day before your procedure:   Take  your regular evening dose    The day of your procedure:   Do not take your morning dose    _  _   INSULIN (SHORT ACTING) MEDICATION INSTRUCTIONS (Regular, Humulog, Novolog)   The day before your procedure:   Do not take your evening dose   The day of your procedure:   Do not take your morning dose   _  _   INSULIN PUMP MEDICATION INSTRUCTIONS  We will contact the physician managing your diabetic care for written dosage instructions for the day before your procedure and the day of your procedure.  Once we have received the instructions, we will contact you.

## 2010-04-14 NOTE — Progress Notes (Signed)
Summary: returned call  Phone Note Call from Patient Call back at [facility] Phone [phone]   Caller: Patient Call For: byrum Reason for Call: Talk to Nurse Summary of Call: pt returned call to [facility] Initial call taken by: Zigmund Gottron,  October 02, 2009 9:47 AM  Follow-up for Phone Call        called spoke with patient, advised of LFT results as stated by RB in append to 7.12.11 labs.  pt verbalized her understading. Follow-up by: Parke Poisson CNA/MA,  October 02, 2009 10:09 AM

## 2010-04-14 NOTE — Op Note (Signed)
Summary: Colonoscopy  AME:  [name], [name]                           ACCOUNT NO.:  [id number]   MEDICAL RECORD NO.:  [id number]                   PATIENT TYPE:  AMB   LOCATION:  ENDO                                 FACILITY:  [facility]   PHYSICIAN:  Waverly Ferrari, M.D.                 DATE OF BIRTH:  February 05, 1951   DATE OF PROCEDURE:  DATE OF DISCHARGE:                                 OPERATIVE REPORT   PROCEDURE:  Colonoscopy.   INDICATIONS FOR PROCEDURE:  Colon cancer screening.   ANESTHESIA:  Demerol 70, Versed 7 mg.   DESCRIPTION OF PROCEDURE:  With the patient mildly sedated in the left  lateral decubitus position, the Olympus videoscopic colonoscope was inserted  in the rectum and passed under direct vision to the cecum identified by the  ileocecal valve and appendiceal orifice the latter of which was  photographed.  From this point, the colonoscope was slowly withdrawn taking  circumferential views of the colonic mucosa stopping in the rectum which  appeared normal on direct and showed hemorrhoids on retroflexed view. The  endoscope was straightened and withdrawn. The patient's vital signs and  pulse oximeter remained stable. The patient tolerated the procedure well  without apparent complications.   FINDINGS:  Internal hemorrhoids otherwise unremarkable exam limited somewhat  by prep.  There was solid material in the colon that could not be suctioned.   PLAN:  Have the patient followup with me as an outpatient in about five  years or as needed.                                               Waverly Ferrari, M.D.    GMO/MEDQ  D:  08/23/2003  T:  08/23/2003  Job:  [id number]

## 2010-04-14 NOTE — Letter (Signed)
Summary: Handout Printed  Printed Handout:  - Coumadin Instructions-w/out Meds

## 2010-04-14 NOTE — Medication Information (Signed)
Summary: rov/tm  Anticoagulant Therapy  Managed by: Alinda Deem, PharmD, BCPS, CPP Referring MD: Baltazar Apo PCP: Ellsworth Lennox Supervising MD: Olevia Perches MD, Meldon Hanzlik Indication 1: Pulmonary Hypertension (dx code) (ICD-402.10) Lab Used: [place] Site: [facility] INR POC 2.0 INR RANGE 2 - 3  Dietary changes: no    Health status changes: no    Bleeding/hemorrhagic complications: no    Recent/future hospitalizations: no    Any changes in medication regimen? no    Recent/future dental: no  Any missed doses?: no       Is patient compliant with meds? yes       Allergies (verified): 1)  ! Codeine 2)  ! * Ivp Dye  Anticoagulation Management History:      The patient is taking warfarin and comes in today for a routine follow up visit.  Positive risk factors for bleeding include presence of serious comorbidities.  Negative risk factors for bleeding include an age less than 65 years old.  The bleeding index is 'intermediate risk'.  Positive CHADS2 values include History of CHF, History of HTN, and History of Diabetes.  Negative CHADS2 values include Age > 52 years old.  The start date was 05/18/2004.  Her last INR was 2.1 RATIO.  Anticoagulation responsible provider: Olevia Perches MD, Darnell Level.  INR POC: 2.0.  Cuvette Lot#: [id number].  Exp: 07/2010.    Anticoagulation Management Assessment/Plan:      The patient's current anticoagulation dose is Warfarin sodium 2.5 mg tabs: Take as directed by coumadin clinic..  The target INR is 2 - 3.  The next INR is due 06/13/2009.  Anticoagulation instructions were given to patient.  Results were reviewed/authorized by Alinda Deem, PharmD, BCPS, CPP.  She was notified by Alinda Deem PharmD, BCPS, CPP.         Prior Anticoagulation Instructions: INR 2.0 Continue 3.35ms daily except 588m on Thursdays. Recheck in 4 weeks.   Current Anticoagulation Instructions: INR 2.0  Continue 2 tabs each Sunday and Thursday and 1.5 tabs on all other  days.  Recheck in 4 weeks.   Appended Document: Orders Update    Clinical Lists Changes  Orders: Added new Test order of TLB-Hepatic/Liver Function Pnl (80076-HEPATIC) - Signed

## 2010-04-14 NOTE — Letter (Signed)
Summary:  [facility]   Imported By: Bubba Hales 06/17/2009 08:54:21  _____________________________________________________________________  External Attachment:    Type:   Image     Comment:   External Document

## 2010-04-14 NOTE — Medication Information (Signed)
Summary: Request for Tracleer Adverse Event Info/[facility]  Request for Tracleer Adverse Event Info/[facility]   Imported By: Phillis Knack 05/01/2009 09:28:53  _____________________________________________________________________  External Attachment:    Type:   Image     Comment:   External Document

## 2010-04-14 NOTE — Progress Notes (Signed)
Summary: pharm calling/ tracleer---  Phone Note From Pharmacy   Caller: mary w/ [facility] Call For: [name]  Summary of Call: wants to know if it's ok to ship the tracleer for pt. pt has not had a pregnancy test (as is protocol). [phone] (speak to any pharmacist) Initial call taken by: Cooper Render, CNA,  June 02, 2009 1:11 PM  Follow-up for Phone Call        per phone note from 02-26-2009, we've already spoke with [facility] regarding this.  Pt had her tubes tied.  Called and spoke with Pharmacist rom Grimsley and informed her of the above information.  Pharmacist stated they never received this update to her records and now manufactor will not accept a verbal ok from [place].  Therefore, will need a new form filled out by RB.  Pharmacist stated she will refax form.  Jinny Blossom Reynolds LPN  June 03, 1023 8:52 PM    received fax and gave to [place].  Jinny Blossom Reynolds LPN  June 02, 7780 4:23 PM   Additional Follow-up for Phone Call Additional follow up Details #1::        Form has been received and I have spoke with Erasmo Downer at [employer]. Pt does not need a pregnancy test due to tubal ligation and a new form will be filled out by Dr. Lamonte Sakai and the pt will sign when here in April 2011 for OV. Form is in [place] look at folder for him to fill out his part and I will hold onto the form until pt is seen at ov. Additional Follow-up by: Francesca Jewett CMA,  June 05, 2009 4:52 PM    Additional Follow-up for Phone Call Additional follow up Details #2::    Form is signed and med has been shipped. Tried to make it clear that [name] DOES NOT need a monthly pregnancy test. Collene Gobble MD  June 05, 2009 5:58 PM  Follow-up by: Collene Gobble MD,  June 05, 2009 5:58 PM

## 2010-04-14 NOTE — Letter (Signed)
Summary: New Patient letter  [facility]  [address] [place], [place] [address]   Phone: [phone]  Fax: [phone]       11/28/2009 MRN: [id number]  [name] [address] [place],   [address]  Dear [name],  Welcome to the Gastroenterology Division at [facility].    You are scheduled to see Dr.  [name] Emery on December 08, 2009 at 3:00pm on the 3rd floor at [facility] [place] [facility].  We ask that you try to arrive at our office 15 minutes prior to your appointment time to allow for check-in.  We would like you to complete the enclosed self-administered evaluation form prior to your visit and bring it with you on the day of your appointment.  We will review it with you.  Also, please bring a complete list of all your medications or, if you prefer, bring the medication bottles and we will list them.  Please bring your insurance card so that we may make a copy of it.  If your insurance requires a referral to see a specialist, please bring your referral form from your primary care physician.  Co-payments are due at the time of your visit and may be paid by cash, check or credit card.     Your office visit will consist of a consult with your physician (includes a physical exam), any laboratory testing he/she may order, scheduling of any necessary diagnostic testing (e.g. x-ray, ultrasound, CT-scan), and scheduling of a procedure (e.g. Endoscopy, Colonoscopy) if required.  Please allow enough time on your schedule to allow for any/all of these possibilities.    If you cannot keep your appointment, please call [phone] to cancel or reschedule prior to your appointment date.  This allows [place] the opportunity to schedule an appointment for another patient in need of care.  If you do not cancel or reschedule by 5 p.m. the business day prior to your appointment date, you will be charged a $50.00 late cancellation/no-show fee.    Thank you  for choosing [facility] for your medical needs.  We appreciate the opportunity to care for you.  Please visit [place] at our website  to learn more about our practice.                     Sincerely,                                                             The Gastroenterology Division

## 2010-04-14 NOTE — Medication Information (Signed)
Summary: [name]  Anticoagulant Therapy  Managed by: Porfirio Oar, PharmD Referring MD: Baltazar Apo PCP: Ellsworth Lennox Supervising MD: Rayann Heman MD, Jeneen Rinks Indication 1: Pulmonary Hypertension (dx code) (ICD-402.10) Lab Used: [place] Site: [facility] INR POC 2.3 INR RANGE 2 - 3  Dietary changes: no    Health status changes: no    Bleeding/hemorrhagic complications: no    Recent/future hospitalizations: no    Any changes in medication regimen? no    Recent/future dental: no  Any missed doses?: no       Is patient compliant with meds? yes       Allergies: 1)  ! Codeine 2)  ! * Ivp Dye  Anticoagulation Management History:      The patient is taking warfarin and comes in today for a routine follow up visit.  Positive risk factors for bleeding include presence of serious comorbidities.  Negative risk factors for bleeding include an age less than 32 years old.  The bleeding index is 'intermediate risk'.  Positive CHADS2 values include History of CHF, History of HTN, and History of Diabetes.  Negative CHADS2 values include Age > 3 years old.  The start date was 05/18/2004.  Her last INR was 2.1 RATIO.  Anticoagulation responsible provider: Mace Weinberg MD, Jeneen Rinks.  INR POC: 2.3.  Cuvette Lot#: [id number].  Exp: 07/2010.    Anticoagulation Management Assessment/Plan:      The patient's current anticoagulation dose is Warfarin sodium 2.5 mg tabs: Take as directed by coumadin clinic..  The target INR is 2 - 3.  The next INR is due 07/23/2009.  Anticoagulation instructions were given to patient.  Results were reviewed/authorized by Porfirio Oar, PharmD.  She was notified by Porfirio Oar PharmD.         Prior Anticoagulation Instructions: INR 2.0  Continue 2 tabs each Sunday and Thursday and 1.5 tabs on all other days.  Recheck in 4 weeks.   Current Anticoagulation Instructions: INR 2.3  Continue same dose of 1 1/2 tablet every day except 2 tablets on Sunday and Thursday   Appended  Document: [name]    Clinical Lists Changes  Orders: Added new Test order of TLB-Hepatic/Liver Function Pnl (80076-HEPATIC) - Signed

## 2010-04-14 NOTE — Letter (Signed)
Summary: [facility]   Imported By: Phillis Knack 01/26/2010 10:27:46  _____________________________________________________________________  External Attachment:    Type:   Image     Comment:   External Document

## 2010-04-14 NOTE — Medication Information (Signed)
Summary: rov/ewj  Anticoagulant Therapy  Managed by: Freddrick March, RN, BSN Referring MD: Baltazar Apo PCP: Ellsworth Lennox Supervising MD: Angelena Form MD, Harrell Gave Indication 1: Pulmonary Hypertension (dx code) (ICD-402.10) Lab Used: [place] Site: [facility] INR POC 1.6 INR RANGE 2 - 3  Dietary changes: no    Health status changes: yes       Details: Saw Dr Sharol Given, receieved Cortisone shot R shoulder.  Rotator cuff problems, will f/u in 3 weeks.    Bleeding/hemorrhagic complications: no    Recent/future hospitalizations: no    Any changes in medication regimen? no    Recent/future dental: no  Any missed doses?: no       Is patient compliant with meds? yes      Comments: Saw Dr Lamonte Sakai yesterday, changed LFT's to every 3 months.    Allergies: 1)  ! Codeine 2)  ! * Ivp Dye  Anticoagulation Management History:      The patient is taking warfarin and comes in today for a routine follow up visit.  Positive risk factors for bleeding include presence of serious comorbidities.  Negative risk factors for bleeding include an age less than 93 years old.  The bleeding index is 'intermediate risk'.  Positive CHADS2 values include History of CHF, History of HTN, and History of Diabetes.  Negative CHADS2 values include Age > 9 years old.  The start date was 05/18/2004.  Her last INR was 2.1 RATIO.  Anticoagulation responsible provider: Angelena Form MD, Harrell Gave.  INR POC: 1.6.  Cuvette Lot#: [id number].  Exp: 12/2010.    Anticoagulation Management Assessment/Plan:      The patient's current anticoagulation dose is Warfarin sodium 2.5 mg tabs: Take as directed by coumadin clinic..  The target INR is 2 - 3.  The next INR is due 11/18/2009.  Anticoagulation instructions were given to patient.  Results were reviewed/authorized by Freddrick March, RN, BSN.  She was notified by Freddrick March RN.         Prior Anticoagulation Instructions: INR 1.1  Resume coumadin after epidural injection  today.  Recheck in 1 week after resumes coumadin.   Current Anticoagulation Instructions: INR 1.6  Take 2.5 tablets today, then take 2 tablets tomorrow, then resume same dosage 1.5 tablets daily except 2 tablets on Sundays and Thursdays.  Recheck 3 weeks.

## 2010-04-15 ENCOUNTER — Other Ambulatory Visit (HOSPITAL_COMMUNITY): Payer: Self-pay | Admitting: Family Medicine

## 2010-04-15 ENCOUNTER — Ambulatory Visit (HOSPITAL_COMMUNITY)
Admission: RE | Admit: 2010-04-15 | Discharge: 2010-04-15 | Disposition: A | Discharge status: Home / Self Care | Payer: Medicare Other | Source: Ambulatory Visit | Attending: Family Medicine | Admitting: Family Medicine

## 2010-04-15 DIAGNOSIS — M549 Dorsalgia, unspecified: Secondary | ICD-10-CM

## 2010-04-16 ENCOUNTER — Other Ambulatory Visit: Payer: Self-pay | Admitting: Family Medicine

## 2010-04-16 ENCOUNTER — Ambulatory Visit (HOSPITAL_COMMUNITY)
Admission: RE | Admit: 2010-04-16 | Discharge: 2010-04-16 | Disposition: A | Discharge status: Home / Self Care | Payer: Medicare Other | Source: Ambulatory Visit | Attending: Family Medicine | Admitting: Family Medicine

## 2010-04-16 DIAGNOSIS — M546 Pain in thoracic spine: Secondary | ICD-10-CM | POA: Insufficient documentation

## 2010-04-16 DIAGNOSIS — R52 Pain, unspecified: Secondary | ICD-10-CM

## 2010-04-16 DIAGNOSIS — M47814 Spondylosis without myelopathy or radiculopathy, thoracic region: Secondary | ICD-10-CM | POA: Insufficient documentation

## 2010-04-16 NOTE — Medication Information (Signed)
Summary: rov/tm  Anticoagulant Therapy  Managed by: Tula Nakayama, RN, BSN Referring MD: Baltazar Apo PCP: Ellsworth Lennox, MD  Supervising MD: Lia Foyer MD, Marcello Moores Indication 1: Pulmonary Hypertension (dx code) (ICD-402.10) Lab Used: [place] Site: [facility] INR POC 2.4 INR RANGE 2 - 3  Dietary changes: no    Health status changes: no    Bleeding/hemorrhagic complications: no    Recent/future hospitalizations: no    Any changes in medication regimen? no    Recent/future dental: no  Any missed doses?: no       Is patient compliant with meds? yes       Allergies: 1)  ! Codeine 2)  ! * Ivp Dye  Anticoagulation Management History:      The patient is taking warfarin and comes in today for a routine follow up visit.  Positive risk factors for bleeding include presence of serious comorbidities.  Negative risk factors for bleeding include an age less than 47 years old.  The bleeding index is 'intermediate risk'.  Positive CHADS2 values include History of CHF, History of HTN, and History of Diabetes.  Negative CHADS2 values include Age > 41 years old.  The start date was 05/18/2004.  Her last INR was 2.1 RATIO.  Anticoagulation responsible Melizza Kanode: Lia Foyer MD, Marcello Moores.  INR POC: 2.4.  Cuvette Lot#: [id number].  Exp: 04/2011.    Anticoagulation Management Assessment/Plan:      The patient's current anticoagulation dose is Warfarin sodium 2.5 mg tabs: Take as directed by coumadin clinic..  The target INR is 2 - 3.  The next INR is due 04/03/2010.  Anticoagulation instructions were given to patient.  Results were reviewed/authorized by Tula Nakayama, RN, BSN.  She was notified by Tula Nakayama, RN, BSN.         Prior Anticoagulation Instructions: INR 2.3 Continue 1.5 pills everyday except 2 pills on Sundays and Thursdays. Take last dose on 02/17/10 if OK'd by Dr Lamonte Sakai.  Restart coumadin post procedure per Dr Kelby Fam instructions.   Current Anticoagulation Instructions: INR  2.4 Continue 1.5 pills everyday except 2 pills on Sundays and Thursdays. Recheck in 4 weeks.

## 2010-04-16 NOTE — Miscellaneous (Signed)
Summary: Orders Update  Clinical Lists Changes  Orders: Added new Test order of TLB-Hepatic/Liver Function Pnl (80076-HEPATIC) - Signed

## 2010-04-16 NOTE — Medication Information (Signed)
Summary: rov/tm  Anticoagulant Therapy  Managed by: Freddrick March, RN, BSN Referring MD: Baltazar Apo PCP: Ellsworth Lennox, MD  Supervising MD: Aundra Dubin MD, Yilin Weedon Indication 1: Pulmonary Hypertension (dx code) (ICD-402.10) Lab Used: [place] Site: [facility] INR POC 2.5 INR RANGE 2 - 3  Dietary changes: no    Health status changes: no    Bleeding/hemorrhagic complications: no    Recent/future hospitalizations: no    Any changes in medication regimen? no    Recent/future dental: no  Any missed doses?: no       Is patient compliant with meds? yes       Allergies: 1)  ! Codeine 2)  ! * Ivp Dye  Anticoagulation Management History:      The patient is taking warfarin and comes in today for a routine follow up visit.  Positive risk factors for bleeding include presence of serious comorbidities.  Negative risk factors for bleeding include an age less than 96 years old.  The bleeding index is 'intermediate risk'.  Positive CHADS2 values include History of CHF, History of HTN, and History of Diabetes.  Negative CHADS2 values include Age > 78 years old.  The start date was 05/18/2004.  Her last INR was 2.1 RATIO.  Anticoagulation responsible provider: Aundra Dubin MD, Paolo Okane.  INR POC: 2.5.  Cuvette Lot#: [id number].  Exp: 03/2011.    Anticoagulation Management Assessment/Plan:      The patient's current anticoagulation dose is Warfarin sodium 2.5 mg tabs: Take as directed by coumadin clinic..  The target INR is 2 - 3.  The next INR is due 05/01/2010.  Anticoagulation instructions were given to patient.  Results were reviewed/authorized by Freddrick March, RN, BSN.  She was notified by Freddrick March RN.         Prior Anticoagulation Instructions: INR 2.4 Continue 1.5 pills everyday except 2 pills on Sundays and Thursdays. Recheck in 4 weeks.   Current Anticoagulation Instructions: INR 2.5  Continue on same dosage 1.5 tablets daily except 2 tablets on Sundays and Thursdays.  Recheck in  4 weeks.

## 2010-04-16 NOTE — Letter (Signed)
Summary: [facility]   Imported By: Rise Patience 04/10/2010 12:53:36  _____________________________________________________________________  External Attachment:    Type:   Image     Comment:   External Document

## 2010-04-16 NOTE — Assessment & Plan Note (Signed)
Summary: gi dismobility.Marland KitchenMarland KitchenMarland KitchenMarland Kitchenem    History of Present Illness Visit Type: Initial Consult Primary GI MD: [name] Emery MD [facility] Primary Provider: Ellsworth Lennox, MD  Requesting Provider: Hennie Duos, MD  Chief Complaint: GERD, dysphagia, nausea, and sometimes will have black tarry stools  History of Present Illness:   [name] is a 60 year old Latin American female referred at the request of Dr. Amil Amen for evaluation of dysphagia.  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  She is complaining of dysphagia to solids.  She also has constant nausea and complains of excess flatus.  Stools are intermittently black and loose.  She apparently had a colonoscopy within the past 2 years.  She takes Coumadin.   GI Review of Systems    Reports acid reflux, bloating, dysphagia with solids, heartburn, and  nausea.      Denies abdominal pain, belching, chest pain, dysphagia with liquids, loss of appetite, vomiting, vomiting blood, weight loss, and  weight gain.      Reports black tarry stools.     Denies anal fissure, change in bowel habit, constipation, diarrhea, diverticulosis, fecal incontinence, heme positive stool, hemorrhoids, irritable bowel syndrome, jaundice, light color stool, liver problems, rectal bleeding, and  rectal pain.    Current Medications (verified): 1)  Lisinopril 10 Mg Tabs (Lisinopril) .Marland Kitchen.. 1 Tab Once Daily 2)  Digoxin 0.125 Mg Tabs (Digoxin) .Marland Kitchen.. 1 Tab Once Daily 3)  Metolazone 2.5 Mg Tabs (Metolazone) .Marland Kitchen.. 1 Tab Every Am 4)  Metoclopramide Hcl 10 Mg Tabs (Metoclopramide Hcl) .... 1/2 Half Tablet Before Meals and At Bedtime 5)  Warfarin Sodium 2.5 Mg Tabs (Warfarin Sodium) .... Take As Directed By Coumadin Clinic. 6)  Prednisone 5 Mg Tabs (Prednisone) .Marland Kitchen.. 1 Tab Two Times A Day 7)  Furosemide 20 Mg Tabs (Furosemide) .... 2 Tabs Once Daily 8)  Lyrica 150 Mg Caps (Pregabalin) .Marland Kitchen.. 1 Tab Three  Times A Day 9)  Hydrocodone-Acetaminophen 10-650 Mg Tabs (Hydrocodone-Acetaminophen) .Marland Kitchen.. 1 Tab Four Times A Day As Needed For Pain 10)  Glucophage 500 Mg Tabs (Metformin Hcl) .Marland Kitchen.. 1 By Mouth Two Times A Day With Meals 11)  Premarin 0.625 Mg Tabs (Estrogens Conjugated) .Marland Kitchen.. 1 By Mouth Daily 12)  Amaryl 4 Mg Tabs (Glimepiride) .Marland Kitchen.. 1 By Mouth Every Am and 1/2 Tab Every Pm With Meals 13)  Tracleer 125 Mg Tabs (Bosentan) .... Take 1 Tablet By Mouth Two Times A Day 14)  Onetouch Ultrasoft Lancets   Misc (Lancets) .... Use T Ocheck Blood Glucose 2-3 Times A Day 15)  Nexium 40 Mg Cpdr (Esomeprazole Magnesium) .Marland Kitchen.. 1 By Mouth Two Times A Day 16)  Tyvaso Starter 0.6 Mg/ml Soln (Treprostinil) .... Four Times A Day 17)  Excedrin Migraine 250-250-65 Mg Tabs (Aspirin-Acetaminophen-Caffeine) .... As Needed  Allergies (verified): 1)  ! Codeine 2)  ! * Ivp Dye  Past History:  Past Medical History: Secondary Pulmonary Hypertension - R heart cath 04/24/04: PAP 88/39, mean 56, PAOP 20 - TTE 05/07/05 RVSP 91 - TTE 09/09/05 PASP 83 - TTE 02/15/06 PASP 90 - TTE 08/15/06   PASP 100 - TTE 09/25/07 PASP 102 - TTE 11/20/08   PASP 42 - 6 minute walk distances: 262m(12/07) -> 3052m12/08) -> 28561m/09) -> 321m22m/10)  COUGH (ICD-786.2) ALLERGIC RHINITIS (ICD-477.9) DIABETES MELLITUS, TYPE II (ICD-250.00) DEGENERATION, LUMBAR/LUMBOSACRAL DISC (ICD-722.52) GERD (ICD-530.81) SCLERODERMA (ICD-710.1) HYPERTENSION, ESSENTIAL NOS (ICD-401.9) Coumadin Therapy  Hx of H-Pylori Gastritis  Past Surgical History: History  of GYN laparoscopic surgery Tonsillectomy Total knee replacement (11/2000) Hysterectomy (1983) Right Shoulder Surgery   Family History: Family History of Coronary Artery Disease: Mother  Family History of Diabetes: Mother and Sister  No FH of Colon Cancer  Social History: Unemployed Separated 2 Childern Tobacco Use - No.  Alcohol Use - no Drug Use - no  Review of Systems       The  patient complains of arthritis/joint pain, back pain, shortness of breath, and swelling of feet/legs.  The patient denies allergy/sinus, anemia, anxiety-new, blood in urine, breast changes/lumps, change in vision, confusion, cough, coughing up blood, depression-new, fainting, fatigue, fever, headaches-new, hearing problems, heart murmur, heart rhythm changes, itching, menstrual pain, muscle pains/cramps, night sweats, nosebleeds, pregnancy symptoms, skin rash, sleeping problems, sore throat, swollen lymph glands, thirst - excessive , urination - excessive , urination changes/pain, urine leakage, vision changes, and voice change.         All other systems were reviewed and were negative   Vital Signs:  Patient profile:   60 year old female Height:      64 inches Weight:      220 pounds BMI:     37.90 Pulse rate:   88 / minute Pulse rhythm:   regular BP sitting:   124 / 60  (left arm) Cuff size:   large  Vitals Entered By: [name] CMA (January 21, 2010 11:04 AM)  Physical Exam  Additional Exam:  On physical exam she is a well-developed well-nourished female  skin: anicteric HEENT: normocephalic; PEERLA; no nasal or pharyngeal abnormalities neck: supple nodes: no cervical lymphadenopathy chest: clear to ausculatation and percussion heart: no murmurs, gallops, or rubs abd: soft, nontender; BS normoactive; no abdominal masses, tenderness, organomegaly; abdomen is somewhat distended and tympanitic.  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  Recommendations #1 upper endoscopy with balloon dilatation as indicated.  Coumadin will be held and advance of the procedure.  Orders: ZEGD Balloon  Dil (ZEGD Balloon)  Problem # 2:  GASTROPARESIS (ICD-536.3) Plan to increase metoclopramide to 10 mg one half hour a.c. and h.s.  Problem # 3:  DIABETES MELLITUS, TYPE II (ICD-250.00)  I suspect that her excess flatus may be related to dysmotility and bacterial overgrowth.  Recommendations #1 trial of ampicillin 250 mg q.i.d. 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  All changed / newly prescribed medications were explained.  A complete medication list was provided to the patient / caregiver. Prescriptions: AMPICILLIN 250 MG CAPS (AMPICILLIN) take one tab q.i.d.  #28 x 1   Entered and Authorized by:   Inda Castle MD   Signed by:   Inda Castle MD on 01/21/2010   Method used:   Electronically to        [id number] (retail)       [place], [place]  [address]       Ph: [phone]       Fax: [phone]   RxID:   [id number]

## 2010-04-16 NOTE — Letter (Signed)
Summary: Appt Reminder [facility]  [place], [place] [address]   Phone: [phone]  Fax: [phone]        February 25, 2010 MRN: [id number]    [name] [address] [place], [place]  [address]    Dear [name],   You have a return appointment with Dr. Deatra Ina on 03/30/10 at 9:15am.  Please remember to bring a complete list of the medicines you are taking, your insurance card and your co-pay.  If you have to cancel or reschedule this appointment, please call before 5:00 pm the evening before to avoid a cancellation fee.  If you have any questions or concerns, please call [phone].    Sincerely,    Rosanne Sack RN  Appended Document: Appt Reminder 2 Letter is mailed to the patient's home address   Rosanne Sack, RN

## 2010-04-16 NOTE — Progress Notes (Signed)
Summary: Tracleer shipment and labs  Phone Note Other Incoming Call back at [phone] ext [phone]   Caller: [email or web address] Summary of Call: Wants to know if pt has had her liver function and if it's ok to ship her tracleer. Initial call taken by: Netta Neat,  February 25, 2010 2:46 PM  Follow-up for Phone Call        Cecille Rubin, have you seen recent labs on this patient? Parke Poisson CNA/MA  February 25, 2010 4:27 PM  Per last OV note, pt will be due for LFT's again in Jan 2012.  RB, is Ms. Burklow having labs done every 3 months now on Tracleer? Pls advise.Francesca Jewett [facility]  February 25, 2010 5:11 PM  Additional Follow-up for Phone Call Additional follow up Details #1::        Per RB, pt will need to resume monthly LFT's for now. LMOMTCB.Francesca Jewett [facility]  February 27, 2010 2:31 PM  cindy w/ [facility] calling back re LFT's. [phone] x [phone]. Cooper Render, CNA  March 02, 2010 2:56 PM    Additional Follow-up for Phone Call Additional follow up Details #2::    Pt does not wnat to have labs drawn every month. She wants to [facility] why was she told tohave themchecked every 3 mths and then now change it to every month. Please advise. [place] Bing CMA  March 02, 2010 3:38 PM  Pt had verbal understanding that Arma wants her to continue getting her liver function checked monthly. Yes, he had told her every 3 months but still isn't comfortable letting her go that long without labwork after rethinking things. The patient agrees to have LFT's done when she goes for her coumadin check on Fri., 03/06/2010.. I will call and tell [facility] its okay to ship Tracleer. Follow-up by: Francesca Jewett CMA,  March 02, 2010 5:14 PM  Additional Follow-up for Phone Call Additional follow up Details #3:: Details for Additional Follow-up Action Taken: [facility] aware okay to ship Tracleer. I will flag myself to look for labs on Fri., 12/23 or Tues., 12/27. Will forward msg to [place] so he is also aware.Francesca Jewett  [facility]  March 02, 2010 5:18 PM

## 2010-04-16 NOTE — Miscellaneous (Signed)
Summary: Orders Update  Clinical Lists Changes  Orders: Added new Test order of T-Hepatic Function [phone]) - Signed

## 2010-04-16 NOTE — Procedures (Signed)
Summary: Upper Endoscopy  Patient: Brentney Goldbach Note: All result statuses are Final unless otherwise noted.  Tests: (1) Upper Endoscopy (EGD)   EGD Upper Endoscopy       DONE     [facility]     [place],   [address]           ENDOSCOPY PROCEDURE REPORT           PATIENT:  [name], [name]  MR#:  [id number]     BIRTHDATE:  1951/02/12, 59 yrs. old  GENDER:  female           ENDOSCOPIST:  Sandy Salaam. Deatra Ina, MD     Referred by:  Ellsworth Lennox, M.D.           PROCEDURE DATE:  02/23/2010     PROCEDURE:  EGD, diagnostic 43235, Maloney Dilation of Esophagus     ASA CLASS:  Class III     INDICATIONS:  dysphagia h/o scleroderma           MEDICATIONS:   Fentanyl 75 mcg IV, Versed 7 mg     TOPICAL ANESTHETIC:  Cetacaine Spray           DESCRIPTION OF PROCEDURE:   After the risks benefits and     alternatives of the procedure were thoroughly explained, informed     consent was obtained.  The  endoscope was introduced through the     mouth and advanced to the third portion of the duodenum, without     limitations.  The instrument was slowly withdrawn as the mucosa     was fully examined.     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     image2).A Maloney dilator was passed  20m  dilator. Minimal     resistance; no heme Otherwise normal esophagus.    Retroflexed     views revealed no abnormalities.    The scope was then withdrawn     from the patient and the procedure completed.           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     eSIGNED:   RSandy Salaam  Kaplan at 02/23/2010 12:47 PM           KDoralee Albino [phone] Note: An exclamation mark (!) indicates a result that was not dispersed into the flowsheet. Document Creation Date: 02/23/2010 12:48 PM _______________________________________________________________________  (1) Order result status: Final Collection or observation date-time: 02/23/2010 12:41 Requested date-time:  Receipt date-time:  Reported date-time:  Referring Physician:   Ordering Physician: RErskine Emery([phone] Specimen Source:  Source: ETawanna CoolerOrder Number: [id number] site:

## 2010-04-16 NOTE — Miscellaneous (Signed)
Summary: Appointment  Clinical Lists Changes Appointment time moved to 8:30am on 03/30/10, patient aware of appointment date and time.

## 2010-04-16 NOTE — Miscellaneous (Signed)
Summary: Orders Update  Clinical Lists Changes  Orders: Added new Test order of TLB-Hepatic/Liver Function Pnl (80076-HEPATIC) - Signed 

## 2010-04-16 NOTE — Miscellaneous (Signed)
Summary: Orders Update  Clinical Lists Changes  Orders: Added new Test order of TLB-BMP (Basic Metabolic Panel-BMET) (80048-METABOL) - Signed 

## 2010-04-16 NOTE — Assessment & Plan Note (Signed)
Summary: Follow-up from EGD/LRH    History of Present Illness Visit Type: Follow-up Visit Primary GI MD: [name] Emery MD [facility] Primary Provider: Ellsworth Lennox, MD  Requesting Provider: na Chief Complaint: Patient here for follow up EGD she still complains of some dysphagia but it is alot better than before the EGD. She states that she is having alot of gas and she was given something before for it.  History of Present Illness:   [name] has returned following upper endoscopy and esophageal dilatation.  A megaesophagus was noted as well as a early distal esophageal stricture.  She reports significant improvement in her dysphagia.   GI Review of Systems    Reports dysphagia with solids.      Denies abdominal pain, acid reflux, belching, bloating, chest pain, dysphagia with liquids, heartburn, loss of appetite, nausea, vomiting, vomiting blood, weight loss, and  weight gain.        Denies anal fissure, black tarry stools, change in bowel habit, constipation, diarrhea, diverticulosis, fecal incontinence, heme positive stool, hemorrhoids, irritable bowel syndrome, jaundice, light color stool, liver problems, rectal bleeding, and  rectal pain.    Current Medications (verified): 1)  Lisinopril 10 Mg Tabs (Lisinopril) .Marland Kitchen.. 1 Tab Once Daily 2)  Digoxin 0.125 Mg Tabs (Digoxin) .Marland Kitchen.. 1 Tab Once Daily 3)  Metolazone 2.5 Mg Tabs (Metolazone) .Marland Kitchen.. 1 Tab Every Am 4)  Metoclopramide Hcl 10 Mg Tabs (Metoclopramide Hcl) .... 1/2 Half Tablet Before Meals and At Bedtime 5)  Warfarin Sodium 2.5 Mg Tabs (Warfarin Sodium) .... Take As Directed By Coumadin Clinic. 6)  Prednisone 5 Mg Tabs (Prednisone) .Marland Kitchen.. 1 Tab Two Times A Day 7)  Furosemide 20 Mg Tabs (Furosemide) .... 2 Tabs Once Daily 8)  Lyrica 150 Mg Caps (Pregabalin) .Marland Kitchen.. 1 Tab Three Times A Day 9)  Hydrocodone-Acetaminophen 10-650 Mg Tabs (Hydrocodone-Acetaminophen) .Marland Kitchen.. 1 Tab Four Times A Day As Needed For Pain 10)  Glucophage 500 Mg Tabs  (Metformin Hcl) .Marland Kitchen.. 1 By Mouth Two Times A Day With Meals 11)  Premarin 0.625 Mg Tabs (Estrogens Conjugated) .Marland Kitchen.. 1 By Mouth Daily 12)  Amaryl 4 Mg Tabs (Glimepiride) .Marland Kitchen.. 1 By Mouth Every Am and 1/2 Tab Every Pm With Meals 13)  Tracleer 125 Mg Tabs (Bosentan) .... Take 1 Tablet By Mouth Two Times A Day 14)  Onetouch Ultrasoft Lancets   Misc (Lancets) .... Use T Ocheck Blood Glucose 2-3 Times A Day 15)  Nexium 40 Mg Cpdr (Esomeprazole Magnesium) .Marland Kitchen.. 1 By Mouth Two Times A Day 16)  Tyvaso Starter 0.6 Mg/ml Soln (Treprostinil) .... Four Times A Day 17)  Excedrin Migraine 250-250-65 Mg Tabs (Aspirin-Acetaminophen-Caffeine) .... As Needed  Allergies (verified): 1)  ! Codeine 2)  ! * Ivp Dye  Past History:  Past Medical History: Reviewed history from 01/21/2010 and no changes required. Secondary Pulmonary Hypertension - R heart cath 04/24/04: PAP 88/39, mean 56, PAOP 20 - TTE 05/07/05 RVSP 91 - TTE 09/09/05 PASP 83 - TTE 02/15/06 PASP 90 - TTE 08/15/06   PASP 100 - TTE 09/25/07 PASP 102 - TTE 11/20/08   PASP 42 - 6 minute walk distances: 272m(12/07) -> 3092m12/08) -> 28549m/09) -> 321m60m/10)  COUGH (ICD-786.2) ALLERGIC RHINITIS (ICD-477.9) DIABETES MELLITUS, TYPE II (ICD-250.00) DEGENERATION, LUMBAR/LUMBOSACRAL DISC (ICD-722.52) GERD (ICD-530.81) SCLERODERMA (ICD-710.1) HYPERTENSION, ESSENTIAL NOS (ICD-401.9) Coumadin Therapy  Hx of H-Pylori Gastritis  Past Surgical History: Reviewed history from 01/21/2010 and no changes required. History of GYN laparoscopic surgery Tonsillectomy Total knee replacement (11/2000)  Hysterectomy (1983) Right Shoulder Surgery   Family History: Reviewed history from 01/21/2010 and no changes required. Family History of Coronary Artery Disease: Mother  Family History of Diabetes: Mother and Sister  No FH of Colon Cancer  Social History: Reviewed history from 01/21/2010 and no changes required. Unemployed Separated 2 Childern Tobacco Use -  No.  Alcohol Use - no Drug Use - no  Review of Systems  The patient denies allergy/sinus, anemia, anxiety-new, arthritis/joint pain, back pain, blood in urine, breast changes/lumps, change in vision, confusion, cough, coughing up blood, depression-new, fainting, fatigue, fever, headaches-new, hearing problems, heart murmur, heart rhythm changes, itching, menstrual pain, muscle pains/cramps, night sweats, nosebleeds, pregnancy symptoms, shortness of breath, skin rash, sleeping problems, sore throat, swelling of feet/legs, swollen lymph glands, thirst - excessive , urination - excessive , urination changes/pain, urine leakage, vision changes, and voice change.    Vital Signs:  Patient profile:   60 year old female Height:      64 inches Weight:      214.6 pounds BMI:     36.97 Pulse rate:   84 / minute Pulse rhythm:   regular BP sitting:   122 / 64  (left arm) Cuff size:   regular  Vitals Entered By: Bernita Buffy CMA Deborra Medina) (March 30, 2010 8:39 AM)   Impression & Recommendations:  Problem # 1:  DYSPHAGIA UNSPECIFIED (ICD-787.20) Assessment Improved This is due to a combination of a motility disorder (megaesophagus) and esophageal stricture.  Recommendations #1 repeat dilatation p.r.n.  Problem # 2:  GASTROPARESIS (ICD-536.3) Plan to continue Reglan  Problem # 3:  DIABETES MELLITUS, TYPE II (ICD-250.00) Assessment: Comment Only  Problem # 4:  SCLERODERMA (ICD-710.1) Assessment: Comment Only  Patient Instructions: 1)  Copy sent to : Ellsworth Lennox, MD  2)  Call back as needed  3)  The medication list was reviewed and reconciled.  All changed / newly prescribed medications were explained.  A complete medication list was provided to the patient / caregiver.

## 2010-04-24 ENCOUNTER — Telehealth: Payer: Self-pay | Admitting: Emergency Medicine

## 2010-05-01 ENCOUNTER — Encounter (INDEPENDENT_AMBULATORY_CARE_PROVIDER_SITE_OTHER): Payer: Medicare Other

## 2010-05-01 ENCOUNTER — Encounter: Payer: Self-pay | Admitting: Internal Medicine

## 2010-05-01 ENCOUNTER — Encounter: Payer: Self-pay | Admitting: Cardiology

## 2010-05-01 ENCOUNTER — Ambulatory Visit (INDEPENDENT_AMBULATORY_CARE_PROVIDER_SITE_OTHER): Payer: Medicare Other | Admitting: Internal Medicine

## 2010-05-01 DIAGNOSIS — I119 Hypertensive heart disease without heart failure: Secondary | ICD-10-CM

## 2010-05-01 DIAGNOSIS — Z7901 Long term (current) use of anticoagulants: Secondary | ICD-10-CM

## 2010-05-01 DIAGNOSIS — I279 Pulmonary heart disease, unspecified: Secondary | ICD-10-CM

## 2010-05-01 LAB — CONVERTED CEMR LAB: POC INR: 2.2

## 2010-05-04 DIAGNOSIS — D684 Acquired coagulation factor deficiency: Secondary | ICD-10-CM

## 2010-05-04 DIAGNOSIS — I2789 Other specified pulmonary heart diseases: Secondary | ICD-10-CM

## 2010-05-04 LAB — CONVERTED CEMR LAB
ALT: 14 units/L (ref 0–35)
Alkaline Phosphatase: 69 units/L (ref 39–117)
Bilirubin, Direct: 0.1 mg/dL (ref 0.0–0.3)
Indirect Bilirubin: 0.2 mg/dL (ref 0.0–0.9)
Total Protein: 6.7 g/dL (ref 6.0–8.3)

## 2010-05-06 NOTE — Progress Notes (Signed)
Summary: [facility] unable to contat pt  Phone Note From Pharmacy Call back at [phone]   Caller: [place] from [facility]: [place] called to inform us that they have been unable to contact patient re: Tracleer.   Initial call taken by: Jacqualine Code,  April 24, 2010 4:14 PM  Follow-up for Phone Call        [place] at [employer].  The office has closed for the day.  Will call back on Monday.  Jinny Blossom Reynolds LPN  April 24, 7898 5:03 PM   Spoke to [place] at [employer] and she just wanted to kinow if we had any updated contact info for this pt. I gave her the contact number that we have in EMR, this is the same number that [facility] had. They will keep trying to get in contact with the pt. [place] Bing CMA  April 27, 2010 9:30 AM  I called the pt and spoke with her and advised that [facility] is trying to get in contact with her about her tracleer. She states she will call [facility] when she has time and that "she does not have time to mess with them. " I asked if she had medication and she states she has plenty and then hangs up.[place] Bing CMA  April 27, 2010 9:35 AM

## 2010-05-06 NOTE — Medication Information (Signed)
Summary: rov/kh   Anticoagulant Therapy  Managed by: Doran Stabler, PharmD Referring MD: Baltazar Apo PCP: Ellsworth Lennox, MD  Supervising MD: Verl Blalock MD, Marcello Moores Indication 1: Pulmonary Hypertension (dx code) (ICD-402.10) Lab Used: [place] Site: [facility] INR POC 2.2 INR RANGE 2 - 3  Dietary changes: no    Health status changes: no    Bleeding/hemorrhagic complications: no    Recent/future hospitalizations: no    Any changes in medication regimen? no    Recent/future dental: no  Any missed doses?: no       Is patient compliant with meds? yes       Allergies: 1)  ! Codeine 2)  ! * Ivp Dye  Anticoagulation Management History:      The patient is taking warfarin and comes in today for a routine follow up visit.  Positive risk factors for bleeding include presence of serious comorbidities.  Negative risk factors for bleeding include an age less than 71 years old.  The bleeding index is 'intermediate risk'.  Positive CHADS2 values include History of CHF, History of HTN, and History of Diabetes.  Negative CHADS2 values include Age > 29 years old.  The start date was 05/18/2004.  Her last INR was 2.1 RATIO.  Anticoagulation responsible provider: Verl Blalock MD, Marcello Moores.  INR POC: 2.2.  Cuvette Lot#: [id number].  Exp: 03/2011.    Anticoagulation Management Assessment/Plan:      The patient's current anticoagulation dose is Warfarin sodium 2.5 mg tabs: Take as directed by coumadin clinic..  The target INR is 2 - 3.  The next INR is due 05/29/2010.  Anticoagulation instructions were given to patient.  Results were reviewed/authorized by Doran Stabler, PharmD.         Prior Anticoagulation Instructions: INR 2.5  Continue on same dosage 1.5 tablets daily except 2 tablets on Sundays and Thursdays.  Recheck in 4 weeks.    Current Anticoagulation Instructions: INR 2.2   Continue on same dose of 1 and 1/2 tablets daily except 2 tablets on Sundays and Thursdays. Recheck INR in 4  weeks.

## 2010-05-12 ENCOUNTER — Encounter: Payer: Self-pay | Admitting: Emergency Medicine

## 2010-05-12 NOTE — Assessment & Plan Note (Signed)
Summary: yearly/sl/ac      Allergies Added: ! CEPHALEXIN  Visit Type:  1 year follow up Referring Provider:  Hennie Duos, MD  Primary Provider:  Ellsworth Lennox, MD   CC:  shortness of breath and swelling.  History of Present Illness: [name] is a 60 year old with a history of scleroderma and secondary pulmonary hypertension.  I saw her one year ago.  She is usually followed by R. Byrum.  She comes back today for f/u since she is followed in the coumadin  SInce seen in pulmonary clinic she has not noted any change in her breathing or in her exercise tolerance.  Tolerating current medical regimen.  Does note some coughing.  Current Medications (verified): 1)  Lisinopril 10 Mg Tabs (Lisinopril) .Marland Kitchen.. 1 Tab Once Daily 2)  Digoxin 0.125 Mg Tabs (Digoxin) .Marland Kitchen.. 1 Tab Once Daily 3)  Metolazone 2.5 Mg Tabs (Metolazone) .Marland Kitchen.. 1 Tab Every Am 4)  Metoclopramide Hcl 10 Mg Tabs (Metoclopramide Hcl) .... 1/2 Half Tablet Before Meals and At Bedtime 5)  Warfarin Sodium 2.5 Mg Tabs (Warfarin Sodium) .... Take As Directed By Coumadin Clinic. 6)  Prednisone 5 Mg Tabs (Prednisone) .Marland Kitchen.. 1 Tab Two Times A Day 7)  Furosemide 20 Mg Tabs (Furosemide) .... 2 Tabs Once Daily 8)  Lyrica 150 Mg Caps (Pregabalin) .Marland Kitchen.. 1 Tab Three Times A Day 9)  Hydrocodone-Acetaminophen 10-650 Mg Tabs (Hydrocodone-Acetaminophen) .Marland Kitchen.. 1 Tab Four Times A Day As Needed For Pain 10)  Glucophage 500 Mg Tabs (Metformin Hcl) .Marland Kitchen.. 1 By Mouth Two Times A Day With Meals 11)  Premarin 0.625 Mg Tabs (Estrogens Conjugated) .Marland Kitchen.. 1 By Mouth Daily 12)  Amaryl 4 Mg Tabs (Glimepiride) .Marland Kitchen.. 1 By Mouth Every Am and 1/2 Tab Every Pm With Meals 13)  Tracleer 125 Mg Tabs (Bosentan) .... Take 1 Tablet By Mouth Two Times A Day 14)  Onetouch Ultrasoft Lancets   Misc (Lancets) .... Use T Ocheck Blood Glucose 2-3 Times A Day 15)  Nexium 40 Mg Cpdr (Esomeprazole Magnesium) .Marland Kitchen.. 1 By Mouth Two Times A Day 16)  Tyvaso Starter 0.6 Mg/ml Soln  (Treprostinil) .... Four Times A Day 17)  Excedrin Migraine 250-250-65 Mg Tabs (Aspirin-Acetaminophen-Caffeine) .... As Needed  Allergies (verified): 1)  ! Codeine 2)  ! * Ivp Dye 3)  ! Cephalexin  Past History:  Past medical, surgical, family and social histories (including risk factors) reviewed, and no changes noted (except as noted below).  Past Medical History: Reviewed history from 01/21/2010 and no changes required. Secondary Pulmonary Hypertension - R heart cath 04/24/04: PAP 88/39, mean 56, PAOP 20 - TTE 05/07/05 RVSP 91 - TTE 09/09/05 PASP 83 - TTE 02/15/06 PASP 90 - TTE 08/15/06   PASP 100 - TTE 09/25/07 PASP 102 - TTE 11/20/08   PASP 42 - 6 minute walk distances: 269m(12/07) -> 3039m12/08) -> 28573m/09) -> 321m80m/10)  COUGH (ICD-786.2) ALLERGIC RHINITIS (ICD-477.9) DIABETES MELLITUS, TYPE II (ICD-250.00) DEGENERATION, LUMBAR/LUMBOSACRAL DISC (ICD-722.52) GERD (ICD-530.81) SCLERODERMA (ICD-710.1) HYPERTENSION, ESSENTIAL NOS (ICD-401.9) Coumadin Therapy  Hx of H-Pylori Gastritis  Past Surgical History: Reviewed history from 01/21/2010 and no changes required. History of GYN laparoscopic surgery Tonsillectomy Total knee replacement (11/2000) Hysterectomy (1983) Right Shoulder Surgery   Family History: Reviewed history from 01/21/2010 and no changes required. Family History of Coronary Artery Disease: Mother  Family History of Diabetes: Mother and Sister  No FH of Colon Cancer  Social History: Reviewed history from 01/21/2010 and no changes required. Unemployed  Separated 2 Childern Tobacco Use - No.  Alcohol Use - no Drug Use - no  Review of Systems       Reviewed.  Neg to the above problem except as noted above.  Vital Signs:  Patient profile:   60 year old female Height:      64 inches Weight:      217 pounds BMI:     37.38 Pulse rate:   87 / minute BP sitting:   124 / 67  (left arm) Cuff size:   regular  Vitals Entered By: Hansel Feinstein CMA  (May 01, 2010 11:30 AM)  Physical Exam  Additional Exam:  Patient is in NAD at rest. HEENT:  Normocephalic, atraumatic. EOMI, PERRLA.  Neck: JVP is normal. No thyromegaly. No bruits.  Lungs: clear to auscultation. No rales no wheezes.  Heart: Regular rate and rhythm. Normal S1.  INcreased P2.    II/VI sys murmur.Marland Kitchen PMI not displaced.  Abdomen:  Supple, nontender. No masses. No signif hepatomegaly.  Extremities:   Good distal pulses throughout. No lower extremity edema.  Musculoskeletal :moving all extremities.  Neuro:   alert and oriented x3.    EKG  Procedure date:  05/01/2010  Findings:      NSR>  81 bpm  Impression & Recommendations:  Problem # 1:  PULMONARY HYPERTENSION, SECONDARY (ICD-416.8) Continue on current regimen.  Check labs today. Patient will f/u with R. Byrum. 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 Recent BMET. Her updated medication list for this problem includes:    Lisinopril 10 Mg Tabs (Lisinopril) .Marland Kitchen... 1 tab once daily    Metolazone 2.5 Mg Tabs (Metolazone) .Marland Kitchen... 1 tab every am    Furosemide 20 Mg Tabs (Furosemide) .Marland Kitchen... 2 tabs once daily  Other Orders: EKG w/ Interpretation (93000)  Patient Instructions: 1)  Your physician recommends that you return for lab work in: lab work today...will call you with results 2)  Your physician wants you to follow-up in: 12 months  You will receive a reminder letter in the mail two months in advance. If you don't receive a letter, please call our office to schedule the follow-up appointment.

## 2010-05-18 ENCOUNTER — Telehealth: Payer: Self-pay | Admitting: Gastroenterology

## 2010-05-21 NOTE — Medication Information (Signed)
Summary: Prescriber response form/[facility]  Prescriber response form/[facility]   Imported By: Phillis Knack 05/14/2010 11:05:51  _____________________________________________________________________  External Attachment:    Type:   Image     Comment:   External Document

## 2010-05-26 NOTE — Progress Notes (Signed)
Summary: Medication  Medications Added METOCLOPRAMIDE HCL 10 MG TABS (METOCLOPRAMIDE HCL) 1 by mouth two times a day 30 minutes before a meal       Phone Note Call from Patient Call back at [facility] Phone [phone]   Caller: Patient Call For: Dr. Deatra Ina Reason for Call: Refill Medication Summary of Call: Needs a new script for Metoclopramide med. 1 tab. twice daily......Marland KitchenEddystone Initial call taken by: Webb Laws,  May 18, 2010 9:15 AM  Follow-up for Phone Call        Called pt to inform that medication was sent to her pharmacy Follow-up by: Genella Mech CMA Deborra Medina),  May 18, 2010 10:38 AM    New/Updated Medications: METOCLOPRAMIDE HCL 10 MG TABS (METOCLOPRAMIDE HCL) 1 by mouth two times a day 30 minutes before a meal Prescriptions: METOCLOPRAMIDE HCL 10 MG TABS (METOCLOPRAMIDE HCL) 1 by mouth two times a day 30 minutes before a meal  #60 x 3   Entered by:   Genella Mech CMA ([place])   Authorized by:   Inda Castle MD   Signed by:   Genella Mech CMA ([place]) on 05/18/2010   Method used:   Electronically to        [facility] (retail)       [address]       [place], [place]  [address]       Ph: [phone]       Fax: [phone]   RxID:   [id number]

## 2010-05-27 LAB — COMPREHENSIVE METABOLIC PANEL
ALT: 17 U/L (ref 0–35)
Alkaline Phosphatase: 57 U/L (ref 39–117)
BUN: 21 mg/dL (ref 6–23)
CO2: 22 mEq/L (ref 19–32)
GFR calc non Af Amer: >60 mL/min (ref >60)
Glucose, Bld: 122 mg/dL — ABNORMAL HIGH (ref 70–99)
Potassium: 4.2 mEq/L (ref 3.5–5.1)
Total Protein: 6.7 g/dL (ref 6.0–8.3)

## 2010-05-27 LAB — GLUCOSE, CAPILLARY
Glucose-Capillary: 134 mg/dL — ABNORMAL HIGH (ref 70–99)
Glucose-Capillary: 165 mg/dL — ABNORMAL HIGH (ref 70–99)
Glucose-Capillary: 80 mg/dL (ref 70–99)

## 2010-05-27 LAB — CBC
HCT: 37.1 % (ref 36.0–46.0)
Hemoglobin: 12 g/dL (ref 12.0–15.0)
MCHC: 32.3 g/dL (ref 30.0–36.0)
MCV: 86.9 fL (ref 78.0–100.0)
RDW: 13.6 % (ref 11.5–15.5)
WBC: 7.6 10*3/uL (ref 4.0–10.5)

## 2010-05-27 LAB — PROTIME-INR
INR: 1.88 — ABNORMAL HIGH (ref 0.00–1.49)
Prothrombin Time: 21.8 seconds — ABNORMAL HIGH (ref 11.6–15.2)

## 2010-05-27 LAB — SURGICAL PCR SCREEN: MRSA, PCR: NEGATIVE

## 2010-05-28 ENCOUNTER — Other Ambulatory Visit: Payer: Self-pay | Admitting: Family Medicine

## 2010-05-28 DIAGNOSIS — Z1231 Encounter for screening mammogram for malignant neoplasm of breast: Secondary | ICD-10-CM

## 2010-05-29 ENCOUNTER — Encounter: Payer: Self-pay | Admitting: Cardiology

## 2010-05-29 ENCOUNTER — Other Ambulatory Visit: Payer: Self-pay | Admitting: Emergency Medicine

## 2010-05-29 ENCOUNTER — Encounter (INDEPENDENT_AMBULATORY_CARE_PROVIDER_SITE_OTHER): Payer: Medicare Other

## 2010-05-29 DIAGNOSIS — Z7901 Long term (current) use of anticoagulants: Secondary | ICD-10-CM

## 2010-05-29 DIAGNOSIS — I2789 Other specified pulmonary heart diseases: Secondary | ICD-10-CM

## 2010-05-29 DIAGNOSIS — I119 Hypertensive heart disease without heart failure: Secondary | ICD-10-CM

## 2010-05-29 LAB — HEPATIC FUNCTION PANEL
ALT: 18 U/L (ref 0–35)
Albumin: 3.6 g/dL (ref 3.5–5.2)
Bilirubin, Direct: 0.1 mg/dL (ref 0.0–0.3)
Total Protein: 6.7 g/dL (ref 6.0–8.3)

## 2010-05-29 LAB — CONVERTED CEMR LAB: POC INR: 2.2

## 2010-06-02 ENCOUNTER — Telehealth: Payer: Self-pay | Admitting: *Deleted

## 2010-06-02 NOTE — Miscellaneous (Addendum)
Summary: Orders Update  Clinical Lists Changes  Orders: Added new Test order of TLB-Hepatic/Liver Function Pnl (80076-HEPATIC) - Signed 

## 2010-06-02 NOTE — Medication Information (Signed)
Summary: rov/pc   Anticoagulant Therapy  Managed by: Danella Penton, RN Referring MD: Baltazar Apo PCP: Ellsworth Lennox, MD  Supervising MD: Verl Blalock MD, Marcello Moores Indication 1: Pulmonary Hypertension (dx code) (ICD-402.10) Lab Used: [place] Site: [facility] INR POC 2.2 INR RANGE 2 - 3  Dietary changes: no    Health status changes: no    Bleeding/hemorrhagic complications: no    Recent/future hospitalizations: no    Any changes in medication regimen? no    Recent/future dental: no  Any missed doses?: no       Is patient compliant with meds? yes       Allergies: 1)  ! Codeine 2)  ! * Ivp Dye 3)  ! Cephalexin  Anticoagulation Management History:      The patient is taking warfarin and comes in today for a routine follow up visit.  Positive risk factors for bleeding include presence of serious comorbidities.  Negative risk factors for bleeding include an age less than 67 years old.  The bleeding index is 'intermediate risk'.  Positive CHADS2 values include History of CHF, History of HTN, and History of Diabetes.  Negative CHADS2 values include Age > 43 years old.  The start date was 05/18/2004.  Her last INR was 2.1 RATIO.  Anticoagulation responsible provider: Verl Blalock MD, Marcello Moores.  INR POC: 2.2.  Cuvette Lot#: [id number].  Exp: 05/2011.    Anticoagulation Management Assessment/Plan:      The patient's current anticoagulation dose is Warfarin sodium 2.5 mg tabs: Take as directed by coumadin clinic..  The target INR is 2 - 3.  The next INR is due 06/26/2010.  Anticoagulation instructions were given to patient.  Results were reviewed/authorized by Danella Penton, RN.  She was notified by Danella Penton, RN.         Prior Anticoagulation Instructions: INR 2.2   Continue on same dose of 1 and 1/2 tablets daily except 2 tablets on Sundays and Thursdays. Recheck INR in 4 weeks.   Current Anticoagulation Instructions: INR 2.2 Continue taking 1 1/2 tablets daily, except take 2 tablets on  Sundays and Thursdays. Recheck in 4 weeks.

## 2010-06-03 NOTE — Telephone Encounter (Signed)
LMOMTCB. LFT's normal per Dr. Lamonte Sakai.

## 2010-06-05 NOTE — Telephone Encounter (Signed)
Pt is aware LFT's are normal per Dr. Lamonte Sakai.

## 2010-06-26 ENCOUNTER — Encounter: Payer: Medicare Other | Admitting: *Deleted

## 2010-06-30 ENCOUNTER — Ambulatory Visit
Admission: RE | Admit: 2010-06-30 | Discharge: 2010-06-30 | Disposition: A | Discharge status: Home / Self Care | Payer: Medicare Other | Source: Ambulatory Visit | Attending: Family Medicine | Admitting: Family Medicine

## 2010-06-30 DIAGNOSIS — Z1231 Encounter for screening mammogram for malignant neoplasm of breast: Secondary | ICD-10-CM

## 2010-07-01 ENCOUNTER — Ambulatory Visit: Payer: Medicare Other

## 2010-07-03 ENCOUNTER — Ambulatory Visit (INDEPENDENT_AMBULATORY_CARE_PROVIDER_SITE_OTHER): Payer: Medicare Other | Admitting: *Deleted

## 2010-07-03 DIAGNOSIS — D684 Acquired coagulation factor deficiency: Secondary | ICD-10-CM

## 2010-07-03 DIAGNOSIS — Z7901 Long term (current) use of anticoagulants: Secondary | ICD-10-CM | POA: Insufficient documentation

## 2010-07-03 DIAGNOSIS — I2789 Other specified pulmonary heart diseases: Secondary | ICD-10-CM

## 2010-07-03 LAB — POCT INR: INR: 1.9

## 2010-07-03 LAB — HEPATIC FUNCTION PANEL
AST: 25 U/L (ref 0–37)
Albumin: 3.3 g/dL — ABNORMAL LOW (ref 3.5–5.2)
Total Bilirubin: 0.5 mg/dL (ref 0.3–1.2)

## 2010-07-07 ENCOUNTER — Other Ambulatory Visit: Payer: Self-pay | Admitting: Emergency Medicine

## 2010-07-14 HISTORY — PX: CARDIAC CATHETERIZATION: SHX172

## 2010-07-20 ENCOUNTER — Telehealth: Payer: Self-pay | Admitting: Internal Medicine

## 2010-07-20 NOTE — Telephone Encounter (Signed)
Pt is having pain in both arm and chest pressure pt wants to talk dr Harrington Challenger nurse. Pt has an appt on Thursday may 10 with scott weaver.

## 2010-07-20 NOTE — Telephone Encounter (Signed)
LMOM for call back/JR

## 2010-07-21 ENCOUNTER — Encounter: Payer: Self-pay | Admitting: Physician Assistant

## 2010-07-21 NOTE — Telephone Encounter (Signed)
Called patient and she advised me that she has been having sharp pains in her arms not related to activity on and off since last week. No more SOB than usual. She has been able to do her normal activities since last week. She has an appointment to see Richardson Dopp on 5/10. I advised her that if her symptoms change to report directly to the emergency room. She verbalized understanding.

## 2010-07-21 NOTE — Telephone Encounter (Signed)
Pt returned you call from yesterday.  Leaving at 10:00 back after 4:00.  Call  [phone]

## 2010-07-23 ENCOUNTER — Encounter: Payer: Self-pay | Admitting: Physician Assistant

## 2010-07-23 ENCOUNTER — Ambulatory Visit (INDEPENDENT_AMBULATORY_CARE_PROVIDER_SITE_OTHER): Payer: Medicare Other | Admitting: Physician Assistant

## 2010-07-23 VITALS — BP 126/69 | HR 86 | Ht 64.0 in | Wt 215.0 lb

## 2010-07-23 DIAGNOSIS — M79603 Pain in arm, unspecified: Secondary | ICD-10-CM | POA: Insufficient documentation

## 2010-07-23 DIAGNOSIS — R079 Chest pain, unspecified: Secondary | ICD-10-CM | POA: Insufficient documentation

## 2010-07-23 DIAGNOSIS — M79609 Pain in unspecified limb: Secondary | ICD-10-CM

## 2010-07-23 NOTE — Assessment & Plan Note (Addendum)
Her symptoms are somewhat atypical.  She does have some chest discomfort associated with her arm discomfort.  She is at risk, given her diabetes, for coronary artery disease.  I will set her up for a LexiScan Myoview.  If this is normal, she can follow up with Dr. Harrington Challenger as previously scheduled.

## 2010-07-23 NOTE — Assessment & Plan Note (Signed)
I question whether or not she has a cervical disc problem contributing to her symptoms.  I have recommended she follow up with her PCP if her stress test is normal.

## 2010-07-23 NOTE — Progress Notes (Signed)
History of Present Illness: Primary Cardiologist:  Dr. Dorris Carnes  [name] is a 60 y.o. female With a history of scleroderma, secondary pulmonary hypertension, diabetes and hypertension who is followed in our Coumadin clinic.  She has seen Dr. Harrington Challenger in the past.  Her last echocardiogram done 9/10 demonstrated an EF of 60-65%, mild to moderate LVH, grade 2 diastolic dysfunction, mild MR, mild RVH, mild RAE, PASP 42.  She presents today with complaints of chest and arm pain.  This has been ongoing for the last 2 weeks.  She denies any specific aggravating or alleviating factors.  She denies exertional symptoms.  She feels a fluttering in her chest that is similar to her previous palpitations.  She has some shortness of breath associated with this.  She has chronic class III dyspnea.  This is related to her pulmonary hypertension.  It is improved with her current medications.  She denies any significant change with these symptoms.  She sleeps on 2-3 pillows.  She has occasional pedal edema.  She denies PND.  She denies syncope or near-syncope.  Past Medical History  Diagnosis Date  . Secondary pulmonary hypertension     right heart cath 04/20/04  . Cough   . Allergic rhinitis, cause unspecified   . Type II or unspecified type diabetes mellitus without mention of complication, not stated as uncontrolled   . Degeneration of lumbar or lumbosacral intervertebral disc   . Esophageal reflux   . Systemic sclerosis   . Unspecified essential hypertension   . Rotator cuff tear   . Gastritis   . Sickle cell trait   . Arthritis   . Obesity   . Deep venous thrombosis     comadin therapy  . Visual changes     Current Outpatient Prescriptions  Medication Sig Dispense Refill  . Aspirin-Acetaminophen-Caffeine (EXCEDRIN MIGRAINE PO) Take by mouth. [phone] Mg tabs as needed.       . bosentan (TRACLEER) 125 MG tablet Take 125 mg by mouth 2 (two) times daily.        . digoxin (LANOXIN) 0.125 MG tablet  Take 125 mcg by mouth daily.        Marland Kitchen esomeprazole (NEXIUM) 40 MG capsule Take by mouth 2 (two) times daily.        Marland Kitchen estrogens, conjugated, (PREMARIN) 0.625 MG tablet Take 0.625 mg by mouth daily. Take daily for 21 days then do not take for 7 days.       . furosemide (LASIX) 20 MG tablet Take 20 mg by mouth. Take 2 tablets daily.       Marland Kitchen glimepiride (AMARYL) 4 MG tablet Take by mouth. 1 by mouth every am and 1/2 tablet every pm with meals.       Marland Kitchen HYDROCODONE-ACETAMINOPHEN PO Take by mouth. 10-650 mg tablets. Take 1 tablet 4 times a day as needed for pain.       Marland Kitchen LISINOPRIL PO Take by mouth. 10 mg daily.       . metFORMIN (GLUCOPHAGE) 500 MG tablet Take by mouth. 1 by mouth 2 times a day with meals.       Marland Kitchen METOCLOPRAMIDE HCL PO Take by mouth. 10 mg tablets. Take one-half tablet before meals and at bedtime.       . metolazone (ZAROXOLYN) 2.5 MG tablet Take by mouth every morning.        . ONE TOUCH LANCETS MISC by Does not apply route. Check Blood Glucose 2-3 times a day.       Marland Kitchen  predniSONE (DELTASONE) 5 MG tablet Take by mouth daily. 1 Tablet 2 times a day.       . pregabalin (LYRICA) 150 MG capsule Take by mouth. 1 tablet 3 times a day.       . Treprostinil (TYVASO STARTER IN) 0.6Mg/ml soln. 4 times a day.       . warfarin (COUMADIN) 2.5 MG tablet take by mouth as directed BY COUMADIN CLINIC  100 tablet  3    Allergies  Allergen Reactions  . Cephalexin   . Ciprofloxacin   . Codeine     REACTION: GI upset  . Iohexol      Code: HIVES, Desc: pt breaks out in large hives. needs full premeds, Onset Date: [id number]     History  Substance Use Topics  . Smoking status: Never Smoker   . Smokeless tobacco: Not on file  . Alcohol Use: No    Family History  Problem Relation Age of Onset  . Heart disease Mother     ROS:  See history of present illness.  She denies fevers, chills, cough.  She denies melena or hematochezia.  She does have some neck pain from cervical disc disease.  She  does have some weakness in her arms.  All other systems reviewed and negative.  Vital Signs: BP 126/69  Pulse 86  Ht _0  (1.626 m)  Wt 215 lb (97.523 kg)  BMI 36.90 kg/m2  PHYSICAL EXAM: Well nourished, well developed, in no acute distress HEENT: normal Neck: no JVD Vascular: No carotid bruits Endocrine: No thyromegaly Cardiac:  normal S1, S2; RRR; no murmur Lungs:  clear to auscultation bilaterally, no wheezing, rhonchi or rales Abd: soft, nontender, no hepatomegaly Ext: Trace bilateral edema Musculoskeletal: Bilateral upper extremity strength is normal and equal, biceps and brachioradialis deep tendon reflexes are 2+ bilaterally, tenderness noted with palpation of the cervical spine around C5-C7 Skin: warm and dry Neuro:  CNs 2-12 intact, no focal abnormalities noted  EKG:  Normal sinus rhythm, left axis deviation, poor R-wave progression, nonspecific ST-T wave changes, No significant change since previous tracing  ASSESSMENT AND PLAN:

## 2010-07-23 NOTE — Patient Instructions (Addendum)
Your physician has requested that you have a [place] [phone] . For further information please visit [email or web address]. Please follow instruction sheet, as given.   YOU WILL RECEIVE A LETTER AROUND DECEMBER TO MAKE AN APPOINTMENT TO SEE DR. ROSS 04/2011.  Your physician recommends that you schedule a follow-up appointment in: WITH DR. Leward Quan AS PER SCOTT WEAVER, PA-C.

## 2010-07-28 ENCOUNTER — Encounter: Payer: Medicare Other | Admitting: *Deleted

## 2010-07-28 NOTE — Assessment & Plan Note (Signed)
[place]                             PULMONARY OFFICE NOTE   LAKIN, RHINE                          MRN:          [id number]  DATE:07/26/2006                            DOB:          Jan 10, 1951    SUBJECTIVE:  Ms. Windish is a 60 year old woman with scleroderma and  resultant pulmonary hypertension.  She follows up today for her  regularly scheduled visit.  She tells me that she has had continued  difficulty with shortness of breath on exertion.  She had initially  improved at her last visit, after we restarted her on bosentan.  She  tells me that her vision is better than on previous visits, likely  reflecting better control of her diabetes.  She has been tried on  sildenafil in the past and her vision became abnormal while on that  medication.  It is unclear as to whether this was related to the  sildenafil or not.   CURRENT MEDICATIONS:  1. Os-Cal plus vitamin D, 500 mg b.i.d.  2. Premarin 0.625 mg daily.  3. Lisinopril 10 mg daily.  4. Digitek 0.125 mg daily.  5. Metolazone 2.5 mg daily.  6. Metoclopramide 5 mg q.a.c. and q.h.s.  7. Bosentan 125 mg b.i.d.  8. Coumadin as directed.  9. Prednisone 5 mg b.i.d.  10.Lasix 20 mg b.i.d.  11.Lyrica 150 mg t.i.d.  12.Insulin sliding scale.  13.Amaryl 4 mg q.a.m. and 2 mg q.p.m.  14.Metformin 500 mg b.i.d.  15.Hydrocodone/APAP 10/650 mg q.i.d. p.r.n.   PHYSICAL EXAMINATION:  GENERAL:  This is a very pleasant obese woman,  who is in no distress.  SKIN:  Unchanged from previous exams.  LUNGS:  Clear without any crackles or wheezes.  HEART:  A regular rate and rhythm with a loud S2.  ABDOMEN:  Benign.  EXTREMITIES:  Have no clubbing, cyanosis or edema.   IMPRESSION:  1. Scleroderma with resultant pulmonary hypertension:  It sounds like      she may be experiencing some slight clinical worsening and decrease      in her functional status, although she continues to be better than      she  was before we restarted her bosentan.   PLAN:  1. I will continue her bosentan and Coumadin as ordered at this time.  2. I will check liver function tests next week when she gets her PT      and INR checked at the Coumadin Clinic.  3. I will perform a six-minute walk and a transthoracic      echocardiogram, to look for objective data to support any worsening      of her pulmonary hypertension.  4. If the above confirms that she is indeed worse, then we may need to      refer her to [facility] for IV therapy, versus the initiation of inhaled      prostanoids here.  5. I will follow up with Ms. Harnack after her testing is completed, to      review the results and the plan therapy.     Herbie Baltimore  Agustina Caroli, MD  Electronically Signed    RSB/MedQ  DD: 07/26/2006  DT: 07/26/2006  Job #: [id number]   cc:   Michael Litter, M.D.  Barton Fanny, M.D.

## 2010-07-28 NOTE — Assessment & Plan Note (Signed)
[place]                             PULMONARY OFFICE NOTE   NORA, SABEY                          MRN:          [id number]  DATE:12/07/2006                            DOB:          Mar 11, 1951    SUBJECTIVE:  [name] is a 60 year old woman with scleroderma  nondistended secondary severe pulmonary hypertension.  She has been  maintained on Coumadin and Bosentan.  She returns today for her  regularly scheduled followup visit.  At our last visit on November 04, 2006, she told me that she had been experiencing some palpitations  without any evidence of chest pain.  At times, she had some associated  dyspnea.  Today she returns stating that the palpitations occasionally  recur.  She has been wearing a Holter monitor, as we ordered at our last  visit, and the data has not been fully downloaded yet.  She has been  taking her Bosentan as scheduled as well as her Coumadin.  She is also  maintained on prednisone 5 mg b.i.d. and has been on this dose for many  months.  Her last LFTs were done November 04, 2006 and were within normal  limits.   CURRENT MEDICATIONS:  Correct as indicated in her clinic chart.   PHYSICAL EXAMINATION:  GENERAL:  This is a pleasant, obese woman in no  distress.  VITAL SIGNS:  Her weight is 203 pounds.  Temperature 97.1, blood  pressure 102/64, heart rate 67, SpO2 93% on room air.  HEENT:  Her posterior pharynx is within normal limits.  NECK:  Large, supple, without any lymphadenopathy or stridor.  LUNGS:  Clear without any crackles or wheeze.  HEART:  Regular with a loud S2.  ABDOMEN:  Obese, soft, nontender with positive bowel sounds.  EXTREMITIES:  No significant clubbing, cyanosis, or edema.  SKIN:  Slightly tight on her fingers.  There is no discoloration.  She  has dark pigmenting on her chest and her lower extremities.   IMPRESSION:  Severe pulmonary hypertension in the setting of  scleroderma, currently maintained  on Bosentan and Coumadin.  I will  continue both of these medications as ordered.  She will also stay on  her prednisone 5 mg twice daily for her scleroderma.  She and I have  discussed possible utility of inhaled iloprost as an adjunct to her  current regimen.  She is still thinking about whether she would like to  pursue this.  With regard to her palpitations, I will evaluate the  download of her Holter monitor when it becomes available.  We will  continue to  check monthly liver function tests as long as she is on Bosentan.  I  will follow up with Ms. Ruberg in 3 months or sooner if she has any  difficulty in the interim.     Collene Gobble, MD  Electronically Signed    RSB/MedQ  DD: 12/07/2006  DT: 12/08/2006  Job #: [id number]   cc:   Barton Fanny, M.D.  Michael Litter, M.D.

## 2010-07-28 NOTE — Assessment & Plan Note (Signed)
[facility] OFFICE NOTE   Pamela, [name]                          MRN:          [id number]  DATE:04/07/2007                            DOB:          01/14/1951    IDENTIFICATION:  [name] is a 60 year old woman who is usually  followed by Dr. Londell Moh in Pulmonary Clinic for her severe pulmonary  hypertension secondary to scleroderma.  She was last seen in November.  She is also followed in our Coumadin Clinic.   She says she comes in today for palpitations.  In review of the chart  looks like she has been evaluated in the past.  Back in August of last  year she had an event monitor that showed sinus rhythm with isolated  PVCs.  Nothing else was done at that time.  She says every few days, she  will feel her chest flutter.  She notes that this is a quick skip,  last seconds.  When she has that she will feel weak.  No chest pain,  they can occur with and without activity.  One episode did wake her from  sleep.   She has chronic shortness of breath but she thinks it is improved some  from November.  No chest pain.   ALLERGIES:  CODEINE and IV CONTRAST.   CURRENT MEDICATIONS:  Os-Cal D 500 b.i.d., Premarin 0.65, lisinopril 10,  Digitek 0.125, metolazone 2.5, metoclopramide q. a.c. nightly, Tracleer  125 b.i.d., Coumadin as directed, prednisone 5 b.i.d., Lasix 20 b.i.d.,  Lyrica 150 t.i.d., insulin as directed, metformin 500 b.i.d., Ventavis 6  times a day, and glyburide 4 mg q. a.m. 2 mg q. p.m.   PAST MEDICAL HISTORY:  1. Scleroderma.  2. Pulmonary hypertension.  3. Diabetes.  4. 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   SOCIAL HISTORY:  She does not smoke.  She does not drink.  She is  separated.   FAMILY HISTORY:  Positive for hypertension, diabetes.   REVIEW OF SYSTEMS:  All systems reviewed negative to the above problem  except as noted.   PHYSICAL EXAM:  The patient is obese  60 year old no acute distress.  Blood pressure 112/67 pulse was 87 and regular, weight 206.  HEENT:  Normocephalic, atraumatic, EOMI, PERL.  Mucous membranes moist.  Oropharynx clear.  NECK:  No lymphadenopathy.  No bruits.  Lungs are clear without wheezes bilaterally.  Cardiac exam regular rate and rhythm,  S1-S2, prominent P2, no murmurs.  Abdomen is obese.  No hepatomegaly.  Supple.  Normal bowel sounds.  EXTREMITIES:  2+ pulses in the lower extremities no edema.   12-lead EKG shows normal sinus rhythm 75 beats per minute.  Slight ST  depression with biphasic T-waves V1 through V5.  Relatively low voltage   IMPRESSION:  Palpitations.  They sound like isolated PVCs that she has  caught on the monitor I will check electrolytes today.  Also a dig  level.  I would continue on current regimen.  I am not sure what else I  would do other  than avoiding stimulants.  I do not think she would  tolerate any negative inotropes.   She will continue follow up in Coumadin and with Dr. by remote be in  touch with them as far as the test results and any other thoughts I may  be missing some of the thoughts in this referral.   I have not had a definite follow-up but will be in touch with them once  I have seen the blood test results.     Fay Records, MD, [facility]  Electronically Signed    PVR/MedQ  DD: 04/07/2007  DT: 04/07/2007  Job #: [id number]   cc:   Collene Gobble, MD

## 2010-07-28 NOTE — Assessment & Plan Note (Signed)
[facility]                            CARDIOLOGY OFFICE NOTE   [name], [name]                        MRN:          [id number]  DATE:05/10/2008                            DOB:          1950/05/11    IDENTIFICATION:  [name] is a 60 year old woman with history of  pulmonary hypertension, scleroderma, diabetes.  I last saw her back in  January 2009.  She continues to follow in our Coumadin Clinic and comes  back for today for a routine return for it.   Since seeing, she was recently in Pulmonary Clinic with Dr. Lamonte Sakai and  noted increased dyspnea and a decrease in her 6-minute walk test.  He  had placed her on Tyvaso inhaled therapy.  She was due to follow up with  an echo and a 6-minute walk test after this.  She could not tolerate  though because of the amount of time it took.  She is caring for her  grandchild and just said this was too much for her.  She is due to be  seen in clinic again this week for consideration of another medicine.   On talking to, she denies chest pain.  She gets a little unsteady in her  feet, but she does not think it is dizziness, that is, she has not had  any syncope.   CURRENT MEDICATIONS:  1. Omeprazole.  2. Os-Cal with D.  3. Premarin 0.55.  4. Lisinopril 10.  5. Digitek 0.125.  6. Metolazone 2.5.  7. Reglan 5 q.a.c., nightly.  8. Tracleer 125 b.i.d.  9. Coumadin as directed.  10.Prednisone 5 b.i.d.  11.Lasix 20 b.i.d.  12.Lyrica.  13.Insulin.  14.Metformin.  15.Glyburide.   REVIEW OF SYSTEMS:  Denies palpitations.   PHYSICAL EXAMINATION:  GENERAL:  The patient is in no distress.  VITAL SIGNS:  Blood pressure 126/69, pulse 82 and regular, weight 215 up  9 pounds from a last clinic visit in January 2009.  LUNGS:  Clear bilaterally.  CARDIAC:  Regular rate and rhythm. S1 and prominent S2.  Grade 2/6  systolic murmur.  ABDOMEN:  Mild right upper quadrant tenderness.  EXTREMITIES:  No edema.   IMPRESSION:  1. Pulmonary hypertension by review of the records that appears to be      progressing clinically.  She is due for followup in Pulmonary this      coming up week.  Would continue on Coumadin and follow up in our      clinic.  2. Scleroderma, again on steroids.  3. Palpitations, she denies.  Would continue current regimen.   I will tentatively set to see her in 1 year as per her Coumadin  requirements.  Sooner if problems develop.  Again, she is followed by  Dr. Londell Moh.     Fay Records, MD, [facility]  Electronically Signed    PVR/MedQ  DD: 05/10/2008  DT: 05/11/2008  Job #: [id number]

## 2010-07-28 NOTE — Assessment & Plan Note (Signed)
[name]                             PULMONARY OFFICE NOTE   LUCCIA, REINHEIMER                          MRN:          [id number]  DATE:09/05/2006                            DOB:          08/07/1950    SUBJECTIVE:  [name] is 60 year old woman with scleroderma and severe  pulmonary hypertension. She has been treated with bosentan since  February of this year. She presents today telling me that her breathing  remains the same. She has not noticed any new problems with tolerating  the medication. She has had a 6 minute walk and a transthoracic  echocardiogram since our last visit, and is here to discuss these  results.   CURRENT MEDICATIONS:  1. Os-Cal plus vitamin D 500 mg b.i.d.  2. Premarin 0.625 mg daily.  3. Lisinopril 10 mg daily.  4. Digitek 0.125 mg daily.  5. Metolazone 2.5 mg daily.  6. Reglan 5 mg q.a.c. and at bedtime.  7. Bosentan 125 mg b.i.d.  8. Warfarin 2.5 mg daily.  9. Prednisone 5 mg b.i.d.  10.Furosemide 20 mg b.i.d.  11.Lyrica 150 mg t.i.d.  12.Sliding scale insulin.  13.Amaryl dose unknown.  14.Metformin 500 mg b.i.d.   PHYSICAL EXAMINATION:  IN GENERAL: This is an obese, pleasant woman who  is in no distress on room air. Her weight is 198 pounds, temperature  98.0, blood pressure 104/72, heart rate 74, SPO2 95% on room air.  HEENT EXAM: Oropharynx  is clear.  NECK: Large but supple without lymphadenopathy. She has no strider.  LUNGS: Clear to auscultation bilaterally.  HEART: Regular rate and rhythm with a prominent S2.  ABDOMEN: Obese but benign.  EXTREMITIES: No significant edema.   A 6 minute walk was performed on Aug 05, 2006. She was able to walk a  total of 309 meters without desaturation. She walked for the entire 6  minutes. This was an improvement compared with her previous 6 minute  walk which was done in December of 2007, at that time she walked 252  meters. A transthoracic echocardiogram was  performed on August 15, 2006.  This showed an estimated pulmonary arterial pressure of 100 millimeters  of mercury. This is up from December of 2007 at which time her estimated  pulmonary arterial systolic pressure was 90 millimeters of mercury.   IMPRESSION:  Severe pulmonary hypertension in the setting of  scleroderma, currently on bosentan and Coumadin. She feels clinically  stable and 6 minute walk has improved from December. Her pulmonary  arterial pressures appear to be slightly increased however. Given her  clinical stability we have decided not to change her medical regimen at  this time. We did discuss the possible future steps for therapy  including inhaled or subcutaneous Prostacyclin, and also possible  referal to a tertiary center for intravenous Prostacyclin. She has tried  sildenafil in the past and was unable to tolerate this medication. We  will discuss these options further depending on her clinical course. I  will follow up with Ms. Deberry in 2 months or sooner should she have any  difficultly in the interim. She will continue to get her liver function  tests each month per our previous schedule.     Collene Gobble, MD  Electronically Signed    RSB/MedQ  DD: 09/06/2006  DT: 09/06/2006  Job #: [id number]

## 2010-07-28 NOTE — Assessment & Plan Note (Signed)
[place]                             PULMONARY OFFICE NOTE   RAFIA, SHEDDEN                          MRN:          [id number]  DATE:01/13/2007                            DOB:          [birth year]    SUBJECTIVE:  [name] is a 60 year old woman with scleroderma and  severe secondary pulmonary hypertension.  She has been treated with  bosentan as well as Coumadin.  Her last visit was on December 30, 2006.  At that time, we discussed possible utility of initiating inhaled  iloprost.  We decided that we would check a walking oximetry and chest x-  ray to ensure that we do not believe that there has been any interval  change.  In the absence of such change and given her slight clinical  worsening and decreased exertional tolerance, we decided we would pursue  inhaled iloprost as discussed below.  She returns today without any new  complaints.  She has had a Holter monitor/event monitor recording  performed which showed PVCs, PACs but no sustained abnormality or  arrhythmia.  She has also had a chest x-ray that showed vascular  congestion but otherwise no changes.   MEDICATIONS:  Reviewed and are correct as indicated on her clinic chart,  dated January 13, 2007.   PHYSICAL EXAMINATION:  GENERAL:  This is a pleasant, obese, African  American woman in no distress on room air.  VITAL SIGNS:  Her weight is 206 pounds which is up 2 pounds from December 30, 2006.  Temperature 98.1, blood pressure 124/72, heart rate 84, SpO2  94% on room air.  HEENT:  Oropharynx is clear.  NECK:  Supple without lymphadenopathy or stridor.  LUNGS:  Clear to auscultation bilaterally.  HEART:  Regular without murmur.  She does have a prominent S2.  ABDOMEN:  Obese and benign.  EXTREMITIES:  Have no significant edema.   IMPRESSION:  Severe pulmonary hypertension in the setting of  scleroderma.   She has had some very mild clinical decline in the setting of a stable 6-  minute walk but worsening pulmonary pressures are on her most recent  echocardiogram.  At this point, we have decided to pursue iloprost and  add this to her current regimen of bosentan and Coumadin.  We will  schedule her first dosing and education to be performed in the office.     Pamela Gobble, MD  Electronically Signed    RSB/MedQ  DD: 01/22/2007  DT: 01/22/2007  Job #: [id number]

## 2010-07-29 ENCOUNTER — Other Ambulatory Visit: Payer: Self-pay | Admitting: Internal Medicine

## 2010-07-30 ENCOUNTER — Ambulatory Visit (INDEPENDENT_AMBULATORY_CARE_PROVIDER_SITE_OTHER): Payer: Medicare Other | Admitting: *Deleted

## 2010-07-30 ENCOUNTER — Ambulatory Visit (HOSPITAL_COMMUNITY): Payer: Medicare Other | Attending: Internal Medicine | Admitting: Radiology

## 2010-07-30 VITALS — Ht 64.0 in | Wt 212.0 lb

## 2010-07-30 DIAGNOSIS — R0789 Other chest pain: Secondary | ICD-10-CM

## 2010-07-30 DIAGNOSIS — R079 Chest pain, unspecified: Secondary | ICD-10-CM | POA: Insufficient documentation

## 2010-07-30 DIAGNOSIS — D684 Acquired coagulation factor deficiency: Secondary | ICD-10-CM

## 2010-07-30 DIAGNOSIS — I2789 Other specified pulmonary heart diseases: Secondary | ICD-10-CM

## 2010-07-30 DIAGNOSIS — I4949 Other premature depolarization: Secondary | ICD-10-CM

## 2010-07-30 LAB — POCT INR: INR: 2.3

## 2010-07-30 MED ORDER — TECHNETIUM TC 99M TETROFOSMIN IV KIT
33.0000 | PACK | Freq: Once | INTRAVENOUS | Status: AC | PRN
  Administered 2010-07-30: 33 via INTRAVENOUS

## 2010-07-30 MED ORDER — REGADENOSON 0.4 MG/5ML IV SOLN
0.4000 mg | Freq: Once | INTRAVENOUS | Status: AC
  Administered 2010-07-30: 0.4 mg via INTRAVENOUS

## 2010-07-30 MED ORDER — TECHNETIUM TC 99M TETROFOSMIN IV KIT
10.7000 | PACK | Freq: Once | INTRAVENOUS | Status: AC | PRN
  Administered 2010-07-30: 11 via INTRAVENOUS

## 2010-07-30 NOTE — Progress Notes (Signed)
[place] [id number] [phone]  Cardiology Nuclear Med Study  [name] is a 60 y.o. female [id number] 1950/07/17   Nuclear Med Background Indication for Stress Test:  Evaluation for Ischemia History:  Asthma and '06 (R) Heart Cath:Severe Pulmonary HTN, '10 Echo:EF=60-65%, mild-moderate LVH, mild MR, H/O CHF Cardiac Risk Factors: Hypertension, Lipids, NIDDM and Obesity  Symptoms:  Chest Pain/Pressure with and without Exertion (last episode of chest discomfort was yesterday), DOE, Palpitations and SOB   Nuclear Pre-Procedure Caffeine/Decaff Intake:  None NPO After: 7:00pm   Lungs:  Clear.  O2 Sat 97% IV 0.9% NS with Angio Cath:  20g  IV Site: R Hand  IV Started by:  Eliezer Lofts, EMT-P  Chest Size (in):  40 Cup Size: D  Height: _0  (1.626 m)  Weight:  212 lb (96.163 kg)  BMI:  Body mass index is 36.39 kg/(m^2). Tech Comments:  CBG= 123 @ 7 am today, per patient.    Nuclear Med Study 1 or 2 day study: 1 day  Stress Test Type:  Lexiscan  Reading MD: Dola Argyle, MD  Order Authorizing Provider:  Dorris Carnes, MD  Resting Radionuclide: Technetium 65mTetrofosmin  Resting Radionuclide Dose: 10.7 mCi   Stress Radionuclide:  Technetium 911metrofosmin  Stress Radionuclide Dose: 33.0 mCi           Stress Protocol Rest HR: 73 Stress HR: 89  Rest BP: 137/73 Stress BP: 143/71  Exercise Time (min): n/a METS: n/a   Predicted Max HR: 162 bpm % Max HR: 54.94 bpm Rate Pressure Product: 12727   Dose of Adenosine (mg):  n/a Dose of Lexiscan: 0.4 mg  Dose of Atropine (mg): n/a Dose of Dobutamine: n/a mcg/kg/min (at max HR)  Stress Test Technologist: ShLetta MoynahanCMA-N  Nuclear Technologist:  StCharlton AmorCNMT     Rest Procedure:  Myocardial perfusion imaging was performed at rest 45 minutes following the intravenous administration of Technetium 9961mtrofosmin.  Rest ECG: No acute changes, isolated  PVC.  Stress Procedure:  The patient received IV Lexiscan 0.4 mg over 15-seconds.  Technetium 45m32mrofosmin injected at 30-seconds.  There were no significant changes with Lexiscan, other than occasional PVC's.  Quantitative spect images were obtained after a 45 minute delay.  Stress ECG: No significant ST segment change suggestive of ischemia.  QPS Raw Data Images:  Normal; no motion artifact; normal heart/lung ratio. Stress Images:  Decreased activity in the mid anterior wall and apex. Rest Images:  Normal homogeneous uptake in all areas of the myocardium. Subtraction (SDS):  There is reversibility Transient Ischemic Dilatation (Normal <1.22):  1.27 Lung/Heart Ratio (Normal <0.45):  0.43  Quantitative Gated Spect Images QGS EDV:  100 ml QGS ESV:  29 ml QGS cine images:  Normal Wall Motion QGS EF: 71%  Impression Exercise Capacity:  Lexiscan with no exercise. BP Response:  Normal blood pressure response. Clinical Symptoms:  SOB ECG Impression:  No significant ST segment change suggestive of ischemia. Comparison with Prior Nuclear Study: No previous nuclear study performed  Overall Impression:  Abnormal stress nuclear study. There is reversible ischemia in the mid anterior wall and apex.     JeffDola Argyle

## 2010-07-31 ENCOUNTER — Encounter: Payer: Self-pay | Admitting: *Deleted

## 2010-07-31 ENCOUNTER — Telehealth: Payer: Self-pay | Admitting: Emergency Medicine

## 2010-07-31 ENCOUNTER — Encounter: Payer: Medicare Other | Admitting: *Deleted

## 2010-07-31 ENCOUNTER — Telehealth: Payer: Self-pay | Admitting: Physician Assistant

## 2010-07-31 NOTE — Assessment & Plan Note (Signed)
[place]                               PULMONARY OFFICE NOTE   Pamela, [name]                          MRN:          [id number]  DATE:11/26/2005                            DOB:          08/15/1950    SUBJECTIVE:  [name] is a 60 year old woman who has been followed by Dr.  Alva Garnet for pulmonary hypertension in the setting of scleroderma.  She is  here for a regularly scheduled followup visit.  She states that she began to  notice some clear phlegm, a rattle in her throat, and some noise in her neck  consistent with wheezing about 2 weeks ago.  She has been having cough that  has been mostly nonpurulent clear mucus.  This has not really changed since  its onset.  She has not had any other symptoms of an upper respiratory  infection.  She does have a history of allergies.  She says that these  usually do not bother her but that they have been more bothersome recently.  Since our last visit, she has had an echocardiogram and a 6-minute walk as  reviewed below.  She has also had her usual laboratory assessment.  Otherwise, she denies any problems.  She does have stable exertional dyspnea  and some waxing and waning lower extremity edema.   MEDICATIONS:  1. HCTZ 25 mg daily.  2. Metoclopramide 5 mg q.a.c. and 10 mg q.h.s.  3. Premarin 0.625 mg daily.  4. Lisinopril 10 mg daily.  5. Protonix 40 mg daily.  6. Bosentan 125 mg b.i.d.  7. Digoxin 0.125 mg daily.  8. Coumadin as directed.  9. Lyrica 75 mg b.i.d.  10.Lasix 20 mg b.i.d.  11.Prednisone 5 mg b.i.d.  12.Metolazone 2.5 mg q.a.m.   PHYSICAL EXAMINATION:  GENERAL:  This is an obese pleasant woman who is in  no distress on room air.  VITAL SIGNS:  Her weight is 220 pounds, temperature 98.0, blood pressure  120/78, heart rate is 74, SpO2 93% on room air.  HEENT:  Unremarkable.  NECK:  Large, supple without lymphadenopathy or JVD.  LUNGS:  Clear to auscultation bilaterally.  HEART:   Regular rate and rhythm with a prominent S2, particularly the P2  component.  ABDOMEN:  Soft, nontender, nondistended with positive bowel sounds.  EXTREMITIES:  Have trace to 1+ pretibial edema.  NEUROLOGIC:  She has a grossly nonfocal exam.   A 6-minute walk performed in July 2007, was 258 meters which is up very  slightly from her previous walk a year ago.  She has had a transthoracic  echocardiogram in June 2007, which showed an estimated PA systolic pressure  of 93-ZJIR.  This is unchanged from 91-mmHg in February 2007.  Her  laboratory evaluation showed normal LFTs and a normal CBC.   IMPRESSION:  1. Scleroderma.  2. Pulmonary hypertension on anticoagulation and Bosentan.  3. Cough and questionable allergic rhinitis.   PLAN:  1. We will initiate a trial of Claritin and an inhaled nasal steroid.  2. We may need to consider changing her ACE inhibitor  if her cough and      throat irritation persist after conservative therapy.  3. Continue her Bosentan and Coumadin as ordered.  4. Continue her prednisone 5 mg b.i.d. per Dr. Tanna Furry orders.  5. Follow her monthly labs on Bosentan.  6. Follow up in 3 months with myself or Dr. Alva Garnet or sooner should she      have any difficulty.                                   Pamela Gobble, MD   RSB/MedQ  DD:  11/29/2005  DT:  11/30/2005  Job #:  [id number]   cc:   Michael Litter, M.D.

## 2010-07-31 NOTE — Op Note (Signed)
[place]. [facility]  Patient:    [name], [name] Visit Number: [id number] MRN: [id number]          Service Type: SUR Location: 3976 [id number] Attending Physician:  Newt Minion Proc. Date: 12/01/00 Admit Date:  12/01/2000                             Operative Report  PREOPERATIVE DIAGNOSIS:  Avascular necrosis of the left knee.  POSTOPERATIVE DIAGNOSIS:  Avascular necrosis of the left knee.  PROCEDURE:  Left total knee arthroplasty with Osteonics Scorpio components posterior stabilized, #7 tibia, #7 femur, #5 patella with a 10 mm polytray posterior stabilized.  SURGEON:  Newt Minion, M.D.  ANESTHESIA:  General endotracheal anesthesia.  ESTIMATED BLOOD LOSS:  Minimal.  TOURNIQUET TIME:  82 minutes at 300 mmHg.  ANTIBIOTICS:  1 gram of Kefzol.  DISPOSITION:  To PACU in stable condition.  INDICATIONS:  The patient is a 61 year old woman with avascular necrosis of the left knee.  The patient has failed conservative care and presents at this time for total knee replacement.  The risks and benefits were discussed and the patient states she understands and wishes to proceed at this time.  DESCRIPTION OF PROCEDURE:  The patient was brought to OR room 5 and underwent a general anesthesia.  After adequate level of anesthesia was obtained, the patients left lower extremity was prepped using Duraprep and draped in a sterile field.  Mioban was used to cover all exposed skin.  The knee was flexed.  The knee was elevated and the tourniquet was inflated to 300 mmHg. The knee was flexed.  The midline incision was made.  A medial parapatellar retinacular incision was made.  The starting hole was made in the femur.  The femoral guide wire was inserted with the femoral cutting block.  10 mm was used for the cutting block and an additional 2 mm was taken off the distal femur. She had a significant amount of complete avascular necrosis collapse of the medial  and lateral femoral condyle and the external bone stock was needed to maintain a stable construct.  This was then sized for a 7 and the 7 cutting jig was placed and the chamfer cuts made for #7.  The external alignment guide was then made and the cutting block was set for 4 mm off the medial most involved joint surface and the cut was made perpendicular to the fibula.  This had a 5 degree posterior slit.  The chamfer cuts were then made in the femur. The trial components were placed.  The femoral component and the tibial trial with a 10 mm polytray.  The knee had full range of motion and full extension. There was good stability with medial and lateral stress.  This was checked with external alignment and the tibia was marked.  The keel cuts were then made for the tibia.  The patella was then resurfaced with 10 mm of the patella taken for resurfacing.  The button holes were made on the patella.  The knee was irrigated with normal saline.  The tibia and femoral permanent components were then cemented in place.  The patella was also cemented in place.  This was allowed to harden.  After the components cement had hardened, the polytray was then placed and the knee was again placed through a range of motion and alignment was checked.  She had good alignment and good  stability.  The patella tracked without any lateral subluxation.  Again the wound was irrigated with pulse lavage.  The deep retinacular incision was closed using #1 Vicryl.  The superficial fascia layer was closed using 2-0 Vicryl, the skin was closed with staples.  The wound was covered with Adaptic, orthopedic sponges, ABD dressing, Webril, and an Ace wrap from toes to thigh.  The patient was extubated and taken to PACU in stable condition.  Total tourniquet time of 82 minutes. Attending Physician:  Newt Minion DD:  12/01/00 TD:  12/01/00 Job: [id number]

## 2010-07-31 NOTE — Discharge Summary (Signed)
[place]. [facility]  Patient:    [name], [name] Visit Number: [id number] MRN: [id number]          Service Type: SUR Location: [place] 01 Attending Physician:  Newt Minion Dictated by:   Newt Minion, M.D. Admit Date:  12/01/2000 Discharge Date: 12/06/2000                             Discharge Summary  DIAGNOSIS:  Avascular necrosis, left knee.  PROCEDURE:  Left total knee arthroplasty.  DISPOSITION:  Discharged to home in stable condition with home health PT and home health R.N.  FOLLOW-UP:  In the office in one to two weeks.  HISTORY OF PRESENT ILLNESS:  The patient is a 60 year old woman with avascular necrosis of her left distal femur and proximal tibia.  The patient has had progressing pain been unable to perform activities of daily living and presents at this time for total knee replacement.  The risks and benefits were discussed.  The patient states she understands and wishes to proceed at this time.  HOSPITAL COURSE:  Brief operative note:  December 01, 2000.  Diagnosis: Avascular necrosis of left knee.  Procedure:  Left total knee arthroplasty with Osteonics components, #7 tibia, #7 femur, #5 patella all cemented with a 10 mm posterior stabilized polyethylene tray.  The patients postoperative course was essentially unremarkable.  She was treated with Keflex for infection prophylaxis and Coumadin for DVT prophylaxis.  She will continue on the Coumadin for four weeks.  She was seen with physical therapy and occupational therapy and was progressing well.  The patient was discharged to home in stable condition on December 06, 2000 with home health PT and R.N. coverage.  She will continue her Coumadin and continue with outpatient therapy.  Plan to follow up in the office in two weeks. Dictated by:   Newt Minion, M.D. Attending Physician:  Newt Minion DD:  12/16/00 TD:  12/17/00 Job: [id number]

## 2010-07-31 NOTE — H&P (Signed)
[place]. [facility]  Patient:    [name], [name]                        MRN: [id number] Adm. Date:  [id number] Disc. 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  Today she notes aching in her anterior chest.  She has had similar episodes in the past.  She denies any radiation of pain or diaphoresis.  Over the past month she has been started on hydrochlorothiazide and kept on Procardia with fair control of her blood pressure.  Two days ago she had an upper GI, which was normal except for some esophagitis.  She also has been seen by her optometrist, who diagnosed some retinal edema.  She has also seen Dr. Alva Garnet for pulmonary hypertension.  PAST SURGICAL HISTORY:  She had a hysterectomy in 1980.  No other surgeries.  HOSPITALIZATIONS: None.  PAST MEDICAL HISTORY:  1. The patient was first diagnosed with scleroderma in 1997 by her     dermatologist, who she went to for vitiligo.  She was referred to Dr.     Justine Null, who has been following her since then.  2. She also has been followed by myself and Dr. Justine Null for:     a. Reflux esophagitis.     b. Osteopenia.     c. Hypertension.  3. She has seen Dr. Meridee Score for osteoarthritis of her left knee.  MEDICATIONS:  1. Prednisone 5 mg b.i.d.  2. Protonix 40 mg b.i.d.  3. Hydrocodone/APAP 5/500 mg b.i.d. p.r.n. pain.  4. Calcium carbonate 500 mg t.i.d.  5. Hydrochlorothiazide 25 mg q.d.  6.  Capoten 25 mg t.i.d.  7. Procardia XL 90 mg q.d.  8. Phenergan 25 mg p.o. q.4h p.r.n. nausea and vomiting.  ALLERGIES:  1. She is intolerant to CODEINE, which has caused nausea and vomiting but she     is able to take Hydrocodone.  2. NIZORAL has caused elevated liver function tests.  FAMILY HISTORY: Her father died in an accident.  Her mother has diabetes and hypertension.  She has seven brothers - two were shot, one died at the age of 79 of an MI, four are alive and well.  She has eight sisters - one died of unknown causes, one died with lupus, one is alive with hypertension, one has had back problems, and four are in good health.  She has two children in good health.  SOCIAL HISTORY: The patient is divorced.  She lives in a second-story apartment with her nephew and several of her sisters live nearby.  Her son is in the [place] in [place], [place].  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  She has never used alcohol or tobacco.  REVIEW OF SYSTEMS: She denies other systemic skin, eye, ENT, respiratory, cardiovascular, GI, GU,  musculoskeletal, neurologic, or psychiatric complaints.  PHYSICAL EXAMINATION:  VITAL SIGNS: Blood pressure 152/91, pulse 90 and regular, respirations 18, temperature 97.1 degrees.  SaO2 97% on room air.  GENERAL: She appears listless.  She is moaning due to nausea and has occasional retching.  No respiratory distress.  She is alert and oriented.  SKIN: Vitiligo changes on the face and the trunk, and some on the extremities as well.  Her skin is thickened and leathery.  Skin is warm and dry.  HEENT: Eyes, PERRL.  Full EOMs.  Right fundus was normal, left fundus shows some slight swelling of the disc.  Sclerae nonicteric.  ENT, TMs were normal. No intraoral lesions.  Pharynx clear.  Mucous membranes moist.  NECK: Supple.  No adenopathy, JVD, or bruits.  Thyroid  normal.  LUNGS: Clear to auscultation and percussion.  HEART: Regular rhythm.  No gallop or murmur or rub.  ABDOMEN: Soft, flat.  There is moderate generalized tenderness to palpation without guarding or rebound.  No organomegaly or mass.  Bowel sounds were normally active.  No bruits.  EXTREMITIES: No pedal edema.  Pedal pulses were full.  NEUROLOGIC: Alert and oriented x 3.  Speech was clear and appropriate.  No extremity weakness or tremor.  DTRs were 1+ in the upper extremities, 1+ at the knees, left ankle 2+, right ankle not obtainable.  Babinskis were downgoing bilaterally.  Cranial nerves intact.  ADMITTING IMPRESSION:  1. Chest discomfort, probably due to reflux esophagitis but rule out     myocardial infarction.  2. Nausea and vomiting, probably due to reflux.  3. Headaches, possibly medication side effect or hypertension.  4. Blurred vision of unknown cause.  5. Scleroderma.  6. Reflux esophagitis.  7. Pulmonary hypertension.  8. Hypertension.  9. Osteopenia. 10. Osteoarthritis of left knee.  PLAN:  1. Monitor with serial CPK-MBs and EKGs.  2. GI consultation.  3. Cranial CT scan.  4. Ophthalmology consultation. DD:  10/09/00 TD:  10/10/00 Job: [id number]

## 2010-07-31 NOTE — Progress Notes (Signed)
ROUTED TO DR. Harrington Challenger.[facility] [place]

## 2010-07-31 NOTE — H&P (Signed)
[place]. [facility]  Patient:    [name], ASPER Visit Number: [id number] MRN: [id number]          Service Type: SUR Location: [place] 01 Attending Physician:  Newt Minion Dictated by:   Newt Minion, M.D. Admit Date:  12/01/2000                           History and Physical  HISTORY OF PRESENT ILLNESS:  The patient is a 60 year old woman with severe history of left knee pain.  She has undergone radiographic studies which show a severe avascular necrosis of her left femoral condyle as well as left tibial plateau.  The patient has failed conservative care and presents at this time for a total knee replacement on the left.  ALLERGIES:  CODEINE causes nausea and vomiting; CT DYE causes a rash.  MEDICATIONS: 1. Prednisone 5 mg two p.o. q.a.m. 2. Metoclopramide 10 mg one-half tablet t.i.d. and q.h.s. 3. Foradil Aerolyzer tablets b.i.d. 4. Hydrochlorothiazide 25 mg q.d. 5. Cozaar 50 mg b.i.d. 6. Premarin 0.625 mg q.d. 7. Captopril 25 mg t.i.d.  PREVIOUS SURGERY:  Tubal ligation, hysterectomy, tonsillectomy, adenoidectomy.  FAMILY HISTORY:  Positive for diabetes and hypertension.  SOCIAL HISTORY:  Negative for tobacco, negative for alcohol.  REVIEW OF SYSTEMS:  Positive for scleroderma, hypercholesterolemia, and hypertension with pulmonary hypertension and renal disease.  She also has a history of peptic ulcer disease with acid reflux.  PHYSICAL EXAMINATION:  VITAL SIGNS:  Temperature 97.7, pulse 88, respiratory rate 16, blood pressure 110/76.  GENERAL:  She is in no acute distress.  She does have skin changes consistent with the scleroderma.  LUNGS:  Clear to auscultation.  CARDIOVASCULAR:  Regular rate and rhythm.  NECK:  Supple, no bruits.  EXTREMITIES:  Left knee with range of motion 10-100 degrees, lacks 10 degrees of full extension, only has flexion to 100 degrees.  She has a good dorsalis pedis pulse.  LABORATORY DATA:  MRI  scan shows avascular necrosis of the medial femoral condyle left knee.  ASSESSMENT:  Avascular necrosis, left knee.  PLAN:  The patient is scheduled for total knee replacement at this time.  The risks and benefits were discussed including infection, neurovascular injury, fracture of the bone, persistent pain, need for additional surgery, DVT, pulmonary embolus, and death.  Patient states she understands and wishes to proceed at this time.  We will plan for Lovenox and Coumadin postoperatively due to patients history of blood clots.  Her primary care physician is Dr. Jake Michaelis and pulmonologist is Dr. Justine Null.   Dictated by:   Newt Minion, M.D.  Attending Physician:  Newt Minion DD:  12/01/00 TD:  12/01/00 Job: [id number]

## 2010-07-31 NOTE — Op Note (Signed)
NAMEJANICA, [name] NO.:  [id number]   MEDICAL RECORD NO.:  [id number]          PATIENT TYPE:  AMB   LOCATION:  ENDO                         FACILITY:  [facility]   PHYSICIAN:  Waverly Ferrari, M.D.    DATE OF BIRTH:  02-09-1951   DATE OF PROCEDURE:  01/06/2006  DATE OF DISCHARGE:                                 OPERATIVE REPORT   PROCEDURE:  Upper endoscopy.   INDICATIONS:  Dysphagia with known scleroderma.   ANESTHESIA:  Fentanyl 50 mcg, Versed 5 mg.   PROCEDURE:  With the patient mildly sedated in the left lateral decubitus  position, the Olympus videoscopic endoscope was inserted in the mouth,  passed under direct vision through the esophagus, which appeared widely  patent.  We entered into the stomach.  The fundus, body appeared normal.  Antrum showed some changes of blood in the antrum and some ulcerations,  which were photographed and biopsied.  Duodenal bulb, second portion  duodenum appeared normal.  From this point the endoscope was slowly  withdrawn taking circumferential views of duodenal mucosa until the  endoscope had been pulled back into the stomach, placed in retroflexion to  view the stomach from below.  The endoscope was then straightened and  withdrawn taking circumferential views of the remaining gastric and  esophageal mucosa.  The patient's vital signs and pulse oximetry remained  stable.  The patient tolerated the procedure well without apparent  complications.   FINDINGS:  Ulcerations the antrum.  Widely patent esophagus.   Await biopsy reports.  The patient will call me for results and follow up  with me as an outpatient.           ______________________________  Waverly Ferrari, M.D.     GMO/MEDQ  D:  01/06/2006  T:  01/07/2006  Job:  [id number]

## 2010-07-31 NOTE — Discharge Summary (Signed)
[name], Pamela NO.:  [id number]   MEDICAL RECORD NO.:  [id number]          PATIENT TYPE:  INP   LOCATION:  2119                         FACILITY:  [facility]   PHYSICIAN:  Collene Gobble, MD    DATE OF BIRTH:  July 03, 1950   DATE OF ADMISSION:  04/04/2006  DATE OF DISCHARGE:  04/08/2006                               DISCHARGE SUMMARY   DISCHARGE DIAGNOSIS:  1. Exacerbation of diabetes mellitus, now insulin dependent.  2. Scleroderma treated with prednisone.  3. Hypertension and pulmonary hypertension followed by Dr. Baltazar Apo.  4. Visual changes with no significant change.   HISTORY OF PRESENT ILLNESS:  [name] is a 60 year old African  American female with a history of scleroderma requiring corticosteroids  associated with severe pulmonary hypertension.  She has been treated  with Bosantin for several months with fairly stable pulmonary arterial  pressures by echocardiogram.  She has been experiencing progressive  exertional dyspnea over the last several months.  In December, we  initiated a trial of Salinophil 20 mg t.i.d. in addition to her existing  therapy.  She noted she was having visual disturbances so this was  stopped.  Her random glucose was noted to be greater than 400 at an  office visit, therefore, she was admitted for further evaluation and  treatment.   PAST MEDICAL HISTORY:  Significant for scleroderma, chronic  corticosteroids, pulmonary hypertension, systemic hypertension, sickle  cell trait, arthritis, status partial hysterectomy, status post  bilateral tubal ligation, status post tonsillectomy, gastroesophageal  reflux disease.   ALLERGIES:  CODEINE AND IV CONTRAST DYE.   LABORATORY DATA:  Sodium 131, potassium 3.7, chloride 95, CO2 95,  glucose 261, BUN 15, creatinine 0.86, calcium 8.8.  WBC 6.2, hemoglobin  14.2, hematocrit 41.8, platelets 189.  INR was noted to be 1.6.  Albumin  was 2.9.  Hemoglobin A1c 13.2.  Blood  cultures were negative.  Chest x-  ray shows stable cardiomegaly, some improvement of bibasilar infiltrates  with persistent low lung volumes.   HOSPITAL COURSE:  1. Diabetes mellitus now insulin dependent.  She was admitted to [facility] and placed on sliding scale insulin along with      Amaryl.  She was given diabetic teaching, the proper use of      glucometers, and the injection of insulin.  She will be followed on      an outpatient basis by Dr. Leward Quan for further adjustments in her      insulin regimen.   1. Scleroderma.  She remains on prednisone for this and is followed by      Dr. Baltazar Apo.   1. Pulmonary hypertension and hypertension.  She remains on her      current medications with no changes.  Note, that her Bostatin, she      has run out of samples and does not have the financial means to      purchase this.   1. Visual changes which are most likely secondary to sildinifil which  is improved.   DISCHARGE MEDICATIONS:  1. Os-Cal plus D 500 mg one daily.  2. Premarin 0.625 mg daily.  3. Lisinopril 10 mg daily.  4. Digitek 0.125 mg daily.  5. Metaxalone 2.5 mg daily.  6. Mecarpalate 5 mg with meals and at bedtime.  7. Bosantin 125 mg two times a day if available.  8. Coumadin 2.5 mg one a day.  9. Prednisone 5 mg b.i.d.  10.Lasix 20 mg two times a day.  11.Pregabalin 150 mg three times a day.  12.Amaryl 4 mg in the a.m.  13.Metformin 500 mg one tab two times a day at 8 a.m. and 5 p.m.  14.Hydrocodone APAP 10/650 four times a day p.r.n.   She is instructed to check her glucose b.i.d. with meals at breakfast  and supper.  If it is greater than 250, to give herself 10 units of  regular insulin.  She is to be on a low carbohydrate diet.  She is to be  seen in the Coumadin clinic on April 11, 2006, at 2 p.m. with Dr.  Leward Quan.  She is to make an appointment in the near future and Dr.  Lamonte Sakai will see her on April 21, 2006, at 10:20  a.m.   CONDITION ON DISCHARGE:  Improved.      Gaylyn Lambert, MSN, ACNP      Collene Gobble, MD  Electronically Signed    SM/MEDQ  D:  04/08/2006  T:  04/08/2006  Job:  [id number]   cc:   Barton Fanny, M.D.

## 2010-07-31 NOTE — Cardiovascular Report (Signed)
[name], [name] NO.:  [id number]   MEDICAL RECORD NO.:  [id number]          PATIENT TYPE:  OIB   LOCATION:  2899                         FACILITY:  [facility]   PHYSICIAN:  Jenkins Rouge, M.D.     DATE OF BIRTH:  1950/06/06   DATE OF PROCEDURE:  04/24/2004  DATE OF DISCHARGE:                              CARDIAC CATHETERIZATION   [name] is a 60 year old patient of Dr. Alva Garnet and Dr. Dannielle Burn.  She has  pulmonary hypertension.  An Adenosine pharmacological study was performed to  assess vasoreactivity and possible benefit to prostacycline and other  vasodilators.   A 4-French arterial sheath was placed in the right femoral artery for  continuous monitoring of blood pressure.   An 8-French sheath was placed in the right femoral vein for Swan-Ganz  placement.  A 0.025 Swan-Ganz wire was used to advance the pulmonary artery  catheter across the tricuspid valve into the PA.   Baseline right heart measurements were as follows:  Mean right atrial  pressure was 23 mmHg, RV pressure 95/12 with post A-wave EDP of 23.  PA  pressure was 88/39 with mean of 56.  Mean pulmonary capillary wedge pressure  was 20.  Aortic pressure was 151/91 with mean systemic pressure of 113.  Aortic sat was 96%.  PA sat was 60%.  Cardiac output by thermodilution was  4.1 liters/minute.   The baseline pulmonary vascular resistance was 780 dynes.   The patient then had graded doses of Adenosine infused over three-minute  intervals.  The patient had severe chest pain even with 50 mcg/kg/min.  We  decided to continue a total of 6-minute infusion with maximum of 100  mcg/kg/min.  The calculated PVR at the end of this infusion was 622.  This  was primarily due to a cardiac output increase from 4.1 to 4.5 liters/min.  The patient's PA systolic pressure stayed above 85 mmHg during the entire  infusion.   Again, we had to stop the test not due to hemodynamic changes, but due to  severe chest  pain which was intolerable to the patient.   IMPRESSION:  Adenosine vasodilator study showing severe pulmonary  hypertension with paced systolic pressures greater than 2/3 systemic.  However, there is minimal vasodilator response to Adenosine.  The patient  will follow up with Dr. Alva Garnet in regards to Coumadin therapy and possible  nocturnal O2.  Although her resting saturations were 96%, I suspect she will  desaturate with exercise and would qualify for home O2.   Note should be made that the patient is allergic to IVP DYE.  However, no  contrast was used during this right heart catheterization pharmacological  study.      PN/MEDQ  D:  04/24/2004  T:  04/24/2004  Job:  [id number]   cc:   Zella Richer. Alva Garnet, M.D. [facility]

## 2010-07-31 NOTE — Op Note (Signed)
NAME:  [name], [name]                           ACCOUNT NO.:  [id number]   MEDICAL RECORD NO.:  [id number]                   PATIENT TYPE:  AMB   LOCATION:  ENDO                                 [facility]ACILITY:  [facility]   PHYSICIAN:  Waverly Ferrari, M.D.                 DATE OF BIRTH:  1950/07/13   DATE OF PROCEDURE:  DATE OF DISCHARGE:                                 OPERATIVE REPORT   PROCEDURE:  Colonoscopy.   INDICATIONS FOR PROCEDURE:  Colon cancer screening.   ANESTHESIA:  Demerol 70, Versed 7 mg.   DESCRIPTION OF PROCEDURE:  With the patient mildly sedated in the left  lateral decubitus position, the Olympus videoscopic colonoscope was inserted  in the rectum and passed under direct vision to the cecum identified by the  ileocecal valve and appendiceal orifice the latter of which was  photographed.  From this point, the colonoscope was slowly withdrawn taking  circumferential views of the colonic mucosa stopping in the rectum which  appeared normal on direct and showed hemorrhoids on retroflexed view. The  endoscope was straightened and withdrawn. The patient's vital signs and  pulse oximeter remained stable. The patient tolerated the procedure well  without apparent complications.   FINDINGS:  Internal hemorrhoids otherwise unremarkable exam limited somewhat  by prep.  There was solid material in the colon that could not be suctioned.   PLAN:  Have the patient followup with me as an outpatient in about five  years or as needed.                                               Waverly Ferrari, M.D.    GMO/MEDQ  D:  08/23/2003  T:  08/23/2003  Job:  [id number]

## 2010-07-31 NOTE — Telephone Encounter (Signed)
Error.[name]

## 2010-07-31 NOTE — H&P (Signed)
NAMESHONTAVIA, MICKEL NO.:  [id number]   MEDICAL RECORD NO.:  [id number]          PATIENT TYPE:  INP   LOCATION:  2010                         FACILITY:  [facility]   PHYSICIAN:  Collene Gobble, MD    DATE OF BIRTH:  [birth year]   DATE OF ADMISSION:  04/04/2006  DATE OF DISCHARGE:                              HISTORY & PHYSICAL   CHIEF COMPLAINT:  1. Malaise.  2. Visual changes.  3. Nausea.  4. Polydipsia.   HISTORY OF PRESENT ILLNESS:  [name] is a 60 year old woman with a  history of scleroderma on chronic corticosteroids and associated severe  pulmonary hypertension.  She has been treated with bosentan for several  months with fairly stable pulmonary arterial pressure by echocardiogram.  She experienced some progressive exertional dyspnea over the last  several months and in December we initiated a trial of sildenafil 20 mg  t.i.d. in addition to her existing therapy.  She notified us that she  was experiencing visual disturbances on March 21, 2006, characterized  by blurry vision and a change in her distance vision.  I stopped her  Revatio at that time and followed the patient clinically.  She continued  to have visual difficulty and was seen by Dr. Thom Chimes in ophthalmology on  March 25, 2006.  That evaluation did not show any evidence of retinal  hemorrhage or optic nerve injury.  It was clear that her glasses  prescription was markedly different than her prior prescription.  We  were unable to rule out Revatio as a possible cause for her visual  disturbances and the medication was not restarted.  [name] now  reports that she has continued to have abnormal vision.  She was  recently evaluated at [facility] by Dr. Ellsworth Lennox on April 01, 2006.  At that time, she reported not only visual changes but  polydipsia, malaise, nausea, and anorexia.  Her random glucose was  greater than 400 and she was started on an oral diabetes medication.  She  is not clear as to the name of this medication at this time.  On  presentation today, she continues to feel quite fatigued.  She has also  continued to have nausea, polydipsia, and anorexia.  Her blood glucose  today is greater than 480.  She believes that her difficulties all began  when she started the Revatio; although, I do not know of any reported  connection between this medication and hyperglycemia.  Clearly she has  had persistent hyperglycemia since at least April 01, 2006, and in  retrospect this may have been at least in part responsible for her  visual changes identified earlier in the month.  She will be admitted  for control of her hyperglycemia, initially with insulin and then  subsequently we will hopefully be able to transition to oral therapy.   PAST MEDICAL HISTORY:  1. Scleroderma on chronic corticosteroids.  2. Pulmonary hypertension.  3. Systemic hypertension.  4. Sickle cell trait.  5. Arthritis.  6. Status post partial hysterectomy.  7. Status post tubal ligation.  8. Status  post tonsillectomy.  9. Gastroesophageal reflux disease.   ALLERGIES:  1. CODEINE.  2. IV CONTRAST DYE.   CURRENT MEDICATIONS:  1. Os-Cal plus vitamin D 500 mg by mouth twice a day.  2. Premarin 0.625 mg p.o. daily.  3. Lisinopril 10 mg daily.  4. Digitek 0.125 mg daily.  5. Metolazone 2.5 mg daily.  6. Metoclopramide 5 mg before each meal and every night at bedtime.  7. Tracleer 125 mg twice a day.  She has not been taking this      medication for the last week because she could not afford the      deductible.  8. Coumadin 2.5 mg daily.  9. Prednisone 5 mg twice a day.  10.Furosemide 20 mg twice a day.  11.Lyrica 450 mg three times a day.  12.Hydrocodone/APAP 10/650 mg four times a day as needed for pain.   SOCIAL HISTORY:  The patient is a never smoker.  She does not use  significant alcohol and she denies any significant occupational or  environmental exposures.   FAMILY  HISTORY:  Significant for coronary artery disease and diabetes.   REVIEW OF SYSTEMS:  As per the history of present illness.   PHYSICAL EXAMINATION:  GENERAL:  This is a pleasant, obese, African  American woman who is more lethargic than on our previous visits.  She  is also slightly confused.  VITAL SIGNS:  Temperature 98.4, weight 197 pounds, blood pressure  100/70, heart rate 83.  SPO2 of 95% on room air.  HEENT:  The oropharynx is somewhat dry.  She does not have any posterior  pharyngeal lesions.  Her pupils are equal, round, and reactive to light  and accommodation.  NECK:  Supple without any evidence of lymphadenopathy or elevated  jugular venous pressure.  LUNGS:  Distant but clear to auscultation bilaterally.  HEART:  Regular rate and rhythm with a prominent P2 and no murmurs.  ABDOMEN:  Obese, soft, nontender, nondistended with positive bowel  sounds.  EXTREMITIES:  No cyanosis or clubbing.  She does have some mild lower  extremity pretibial edema.  SKIN:  Somewhat dry without any evidence of rash.  NEUROLOGICALLY:  She is alert but a bit sluggish and sleepy.  She is  oriented times three.  Grossly nonfocal exam.   LABORATORY EVALUATION:  Sodium 130, potassium 4.6, chloride 94, carbon  dioxide 24, glucose 484, BUN 17, creatinine 1.3.  Total bilirubin 1.5,  alkaline phosphatase 100, SGOT 29, SGPT 19, total protein 8.1, albumin  3.3.  Calcium 8.9.   IMPRESSION:  1. Hyperglycemia in apparent new onset diabetes mellitus with probable      contribution of chronic prednisone use and possibly some influence      of sildenafil; although, I do not know of any reported relationship      between this medication and poor glycemic control.  I started her      on sliding-scale insulin protocol and once good control is      established, we will initiate oral therapy (plus/minus insulin).     She will find out the medication that she has been given recently      for glycemic control by  [facility].  I will continue to hold her      Revatio and will culture her blood, urine, sputum, and will perform      a chest x-ray to rule out any occult infectious process that may be      causing her glycemic control to  decompensate.  2. Scleroderma with associated pulmonary hypertension.  She has been      on Tracleer but ran out of this medication last week.  We will need      to reinitiate the forms that are required to provide her medication      through indigent care.  I will restart her Tracleer while in the      hospital and will initiate this process today in the office.  I      will continue her Coumadin as ordered.  Finally, we will continue      her baseline prednisone dosing at 5 mg twice a day.  3. Hypertension.  4. Obesity.  5. History of mild airflow limitation, not currently on      bronchodilators.  6. Visual disturbance, possibly related to sildenafil with likely      contribution of hyperglycemia.  7. Gastrointestinal prophylaxis will be with Protonix.  8. Deep venous thrombosis prophylaxis will be with enoxaparin and her      existing Coumadin dosing.      Collene Gobble, MD  Electronically Signed     RSB/MEDQ  D:  04/04/2006  T:  04/05/2006  Job:  [id number]   cc:   Barton Fanny, M.D.

## 2010-07-31 NOTE — Assessment & Plan Note (Signed)
[facility]                             PULMONARY OFFICE NOTE   [name], [name]                          MRN:          [id number]  DATE:04/21/2006                            DOB:          Dec 29, 1950    PULMONARY FOLLOWUP VISIT   SUBJECTIVE:  [name] is a 60 year old woman with scleroderma and  resultant pulmonary hypertension.  She has been maintained on bosentan  in the past.  Because she is experiencing some worsening dyspnea and  slight decrease in her 6 minute walk, I tried her on sildenafil in  addition to the bosentan.  As mentioned in previous notes, she did not  tolerate the sildenafil due to visual changes.  At our last visit on  January 21 of 2008, she presented with malaise, blurred vision, and  complained of polydipsia and polyuria.  Her glucose was markedly  elevated and I admitted her to the hospital to get her sugars under  control.  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  She returns today for a post-hospital followup.  She initially felt that  her vision was better at the end of her hospitalization and the first  couple days that she was home, but now her vision is worse again.  Her  sugars at home have been averaging between 130 and 140s when she checks  them with her Glucometer.  She has been feeling drained since leaving  the hospital.  Importantly, she is also having more exertional dyspnea  and has to stop to rest even with significant walking.  She feels a  tightness in her chest and, at times, with significant exertion has felt  light-headed.  Importantly, she still has not restarted her bosentan and  we need to address this issue today.   CURRENT MEDICATIONS:  1. Os-Cal plus vitamin D 500 mg daily.  2. Premarin 0.625 mg daily.  3. Lisinopril 10 mg daily.  4. Digitek 0.125 mg daily.  5. Metaxalone 2.5 mg daily.  6. Mecarpalate 5 mg with meals and at bedtime.  7. Bosentan, which she is not  currently taking because it is      unavailable.  8. Coumadin 2.5 mg daily.  9. Prednisone 5 mg b.i.d.  10.Lasix 20 mg b.i.d.  11.Pregabalin 150 mg t.i.d.  12.Amaryl 4 mg in the morning and 2 mg in the evening.  13.Hydrocodone APAP 10/650 mg q.i.d. p.r.n.  14.She was on metformin at the time of discharge and was intended to      be discharged on this medication, but a prescription was not given      and she is not on this medication currently.   PHYSICAL EXAM:  GENERAL:  This is an obese, well-appearing African-  American woman who is in no distress on room air.  HEENT:  Benign.  NECK:  No stridor.  LUNGS:  Clear to auscultation bilaterally without any crackles or  wheezing.  ABDOMEN:  Obese, soft, and nontender with positive bowel sounds.  HEART:  Regular rate and rhythm with a loud S2.  EXTREMITIES:  Trace  lower extremity edema.  NEUROLOGIC:  Nonfocal exam.   Random glucose in the office today is 161.   IMPRESSION:  1. Scleroderma currently on prednisone 5 mg b.i.d.  2. Severe pulmonary hypertension secondary to her scleroderma without      any evidence of interstitial lung disease.  She now has worsening      dyspnea since her bosentan has been stopped.  She has been off the      medications for approximately 1 month.  3. Diabetes mellitus type 2, which is a new diagnosis and is likely      being influenced by her corticosteroid use.  She is on Amaryl only      right now, although I intended her to be discharged on Amaryl and      metformin.  4. Visual changes with a waxing and waning nature that suggests to me      that it is probably due to her glycemic control.  I believe she has      been off the Revatio long enough for any visual changes due to this      medication to have cleared.  I will send her back to Dr. Thom Chimes      with ophthalmology with plans for her to get a new prescription for      her glasses.   PLAN:  1. Follow up with Dr. Thom Chimes as mentioned.  2. Add  back her metformin 500 mg b.i.d. and plan to follow up with Dr.      Leward Quan at [facility].  3. I need to get her bosentan restarted and we will work on the      appropriate paperwork to get her compassionate use of this      medication as soon as possible.  4. I will follow up with [name] in 1 month or sooner should she      have any further difficulties.     Collene Gobble, MD  Electronically Signed    RSB/MedQ  DD: 04/21/2006  DT: 04/21/2006  Job #: [id number]   cc:   Barton Fanny, M.D.

## 2010-07-31 NOTE — Telephone Encounter (Signed)
LMOM needs heart cath and Lovenox coverage per Dr.Ross.

## 2010-07-31 NOTE — Assessment & Plan Note (Signed)
[place]                             PULMONARY OFFICE NOTE   [name], [name]                          MRN:          [id number]  DATE:02/08/2006                            DOB:          1950-05-11    SUBJECTIVE:  [name] is a 60 year old woman followed by Dr. Justine Null for  scleroderma and seen here for associated severe pulmonary hypertension.  She is here for regularly scheduled followup. At her last visit we noted  that she had a stable 6 minute walk, a stable echocardiogram. At that  time her only real complaints were some increasing cough. I asked her to  try Claritin, an inhaled nasal steroid. It is not clear to me whether  she actually used these medications, and she cannot remember. She has a  medicine list with her today that appears to be from May 2007, but it  does not sound accurate based on her reporting to me today in the  office. In particular Dr. Alva Garnet had apparently asked her to increase  her Lasix to 40 mg p.o. b.i.d. back in June, and she is not sure that  that is what she has been taking and states she believes she is taking  20 mg twice a day. Today she complains of more exertional dyspnea. She  gets short of breath when she sweeps the floor or walks around. She also  has some difficulty getting into bed and sometimes feels short of breath  while supine. She wakes up occasionally short of breath as well. She  denies any new skin symptoms. She occasionally has Raynaud's phenomenon,  but it has not been bothersome because the weather has not been cold as  of yet.   PHYSICAL EXAMINATION:  GENERAL:  An obese woman who is in no distress on  room air.  VITAL SIGNS:  Weight 221 pounds which is the same as her last visit in  September, temperature 98.2, blood pressure 112/70, heart rate 79, SVO2  94% on room air.  LUNGS:  Have small volumes but no crackles.  HEART:  Has a regular rate and rhythm with loud S2.  SKIN:  Shows no  tightness. There is some dark discoloration on her lower  extremities.  ABDOMEN:  Obese, soft, nontender with positive bowel sounds.  EXTREMITIES:  Have 1+ edema.   LABORATORY EVALUATION:  From January 13, 2006 shows white blood cell  count 7.3, hematocrit 31.4, platelets 180. LFTs were within normal range  with the exception of the AST which was slightly elevated at 43.   IMPRESSION:  1. Scleroderma.  2. Pulmonary arterial hypertension.  3. Cough with probable allergic rhinitis.   PLAN:  1. I am concerned about her worsening dyspnea and also difficulty      remembering her medications accurately. I am going to repeat a 6      minute walk and a transthoracic echocardiogram to ensure stability.  2. Continue her current bosentan dosing.  3. Coumadin as currently dosed.  4. Follow LFTs and hematocrit monthly.  5. She will confirm her medications and in  particular her Lasix dose      before our next appointment. She will bring her medications with      her next time so that we can adjust the      medications accurately.  6. I will follow up with Ms. Estorga after the studies above have been      performed.     Collene Gobble, MD  Electronically Signed    RSB/MedQ  DD: 02/08/2006  DT: 02/08/2006  Job #: [id number]   cc:   Michael Litter, M.D.

## 2010-07-31 NOTE — Op Note (Signed)
[name], [name] NO.:  [id number]   MEDICAL RECORD NO.:  [id number]          PATIENT TYPE:  AMB   LOCATION:  ENDO                         FACILITY:  [facility]   PHYSICIAN:  Waverly Ferrari, M.D.    DATE OF BIRTH:  Dec 20, 1950   DATE OF PROCEDURE:  03/10/2006  DATE OF DISCHARGE:                               OPERATIVE REPORT   PROCEDURE:  Upper endoscopy with biopsy.   INDICATIONS:  Gastric ulcers.   ANESTHESIA:  Fentanyl 75 mcg, Versed 7 mg.   PROCEDURE:  With the patient mildly sedated in the left lateral  decubitus position, the Pentax Video endoscope was inserted and now  passed under direct vision through the esophagus which showed changes of  some irregularity of the squamocolumnar junction, which were  photographed and biopsied.  We entered into the stomach fundus, body  appeared normal.  The antrum showed some erosions that were biopsied,  but very superficial duodenal bulb, the second portion duodenum appeared  normal.  From this point the endoscope was slowly withdrawn taking  circumferential views of duodenal mucosa until the endoscope was then  pulled back into the stomach and placed in retroflexion to view the  stomach from below.  The endoscope was then straightened and withdrawn,  taking circumferential views of the remaining gastric and esophageal  mucosa.  The patient's vital sign, __________, pulse oximetry remained  stable.  The patient tolerated the procedure well without apparent  complications.   FINDINGS:  Very superficial erosions of the antrum, question of  esophagitis versus Barrett's esophagus.  Biopsies were taken and await  biopsy report.  The patient will call me for results and follow up with  me as an outpatient.           ______________________________  Waverly Ferrari, M.D.     GMO/MEDQ  D:  03/10/2006  T:  03/10/2006  Job:  [id number]

## 2010-07-31 NOTE — Discharge Summary (Signed)
[place]  Patient:    [name], [name]                        MRN: [id number] Adm. Date:  [id number] Disc. Date: [id number] Attending:  Birdena Crandall CC:         Roswell Miners, M.D.  Jim Desanctis, M.D.  Fabio Pierce, M.D.   Discharge Summary  HISTORY OF PRESENT ILLNESS:  The patient is a 60 year old female with scleroderma and reflux esophagitis who felt fairly well up until one month ago when she developed elevated blood pressures which were difficult to control, severe daily headaches, nausea, vomiting of white foamy vomitus and bilateral blurred vision.  She also has a dry cough with some shortness of breath, sweating, dizziness, generalized weakness and malaise.  On the day of admission, she noted some aching in her anterior chest.  She has had similar episodes in the past.  She denies any radiation of the pain or diaphoresis. Over the past month, she has been started on hydrochlorothiazide and Procardia with fair control of her blood pressure.  Two days prior to admission, she had an upper GI which was normal except for some esophagitis.  She also had been seen by her optometrist who diagnosed retinal edema secondary to hypertension and she has also seen Dr. Merton Border for pulmonary hypertension.  PHYSICAL EXAMINATION:  VITAL SIGNS:  Blood pressure 152/91, pulse 90, respiratory rate 18 and temperature 97.1.  GENERAL:  She appeared listless.  She was moaning due to nausea and had occasional wretching.  There was no respiratory distress.  She was alert and oriented.  HEENT:  The patient had hypopigmentation on the face and trunk and extremities.  Her skin is thick and leathery, although skin was warm and dry. Pupils are equal, round and reactive to light.  She had full EOMs.  Right fundus was normal.  Left fundus showed slight swelling of the disc.  Sclerae were nonicteric.  ENT was otherwise normal.  There was no cervical  adenopathy. Thyroid was normal.  LUNGS:  Clear.  HEART:  Regular rhythm without gallop or murmur.  ABDOMEN:  Soft and flat.  There was moderate generalized tenderness to palpation without guarding or rebound.  There was no organomegaly or mass. Bowel sounds were normally active.  EXTREMITIES:  She had no pedal edema.  Peripheral pulses were full.  NEUROLOGIC:  She was alert and oriented x 3.  Speech was clear and appropriate.  There was no extremity weakness or tremor.  DTRs were 1+ in the upper extremities, 1+ at the knees, 2+ at the left ankle, the right ankle was unobtainable.  Babinskis were downgoing.  Cranial nerves II-XII intact.  IMPRESSION:  1. Chest discomfort, probably reflux esophagitis, but rule out myocardial     infarction or angina.  2. Nausea and vomiting possibly due to reflux.  3. Headaches, possibly medication side effect or hypertension.  4. Blurred vision of unknown cause.  5. Scleroderma.  6. Reflux esophagitis.  7. Pulmonary hypertension.  8. Hypertension.  9. Osteopenia. 10. Osteoarthritis of left knee.  LABORATORY DATA AND X-RAY FINDINGS:  An EKG showed normal sinus rhythm with nonspecific T wave abnormalities with slightly prolonged QT interval and was otherwise normal.  Follow up tracing the next day was unchanged.  CT of the brain without contrast showed no acute intracranial abnormality.  Panorex of the jaw was negative.  Her hemoglobin on admission was  15.3, white count 5400. Sedimentation rate was 22.  INR was 1.2.  Sodium 136, potassium 3.5, glucose 139, BUN 11, creatinine 1.4.  Her amylase was 138, total protein 8.9 with albumin of 3.7.  CPK was normal with negative MB.  Her troponin level was 0.18, however, serial CPKs were all within normal limits and CPK-MBs were negative.  It was felt that this positive troponin test was a false positive. Her UA was positive for protein.  It showed many epithelial, 3-6 wbcs and a few bacteria.  Urine  culture was negative.  HOSPITAL COURSE:  The patient was admitted to a monitored bed on the 3700 floor.  Vital signs were obtained every four hours.  She was allowed to be up ad lib and given a clear liquid diet with IV D-5-1/4 normal saline at 100 cc per hour.  She was continued on prednisone 5 mg b.i.d., Protonix 40 mg b.i.d., HCTZ 25 mg once a day, Capoten 25 mg t.i.d., Procardia XL 90 mg per day and metoclopramide 10 mg q.i.d. was added.  She was given Percocet and morphine for pain and Phenergan for nausea and vomiting.  She was seen in consultation by Dr. Delman Cheadle, Dr. Lajoyce Corners from gastroenterology and also by her rheumatologist, Dr. Thomos Lemons.  By the following morning, the patient still had severe right hemicranial headache unrelieved by morphine. She denied nausea or vomiting and had little chest pain.  Her monitor showed normal sinus rhythm.  It was felt that the chest pain was due to reflux esophagitis and it was felt that the positive troponin was a false positive. We also felt that the temporal headache was a side effect of the Procardia and this was discontinued.  Dr. Delman Cheadle felt that her eye exam was normal and that the blurred vision was due to the high blood pressure and would probably resolve as her blood pressure remained under control.  After the Procardia was discontinued, her blood pressure did go up and Cozaar 50 mg b.i.d. was added.  A cranial MRI and abdominal CT were also done.  Results are not in the chart right now, but to my recollection, they were both negative and within normal limits.  As of October 13, 2000, the patient felt a lot better.  Her headache was improved.  She was able to eat a regular diet.  She had no further nausea or vomiting.  Her blood pressure was 105/66.  As of October 14, 2000, the patient states that her headaches were improving and  she had no further chest pain with no further nausea or vomiting.  Her lungs were clear.  Cardiac exam was  normal.  Abdomen was soft and nontender without organomegaly or mass.  It was felt that she could be discharged on that date in improved condition.  DISCHARGE DIAGNOSES: 1. Chest pain due to reflux esophagitis. 2. Scleroderma. 3. Severe headaches, possibly medication side effects. 4. Severe hypertension. 5. Osteopenia. 6. Osteoarthritis of left knee. 7. Blurred vision, probably due to hypertension. 8. Pulmonary hypertension.  DISCHARGE MEDICATIONS: 1. Prednisone 5 mg b.i.d. 2. Protonix 40 mg b.i.d. 3. Hydrochlorothiazide 25 mg daily. 4. Capoten 25 mg three times a day. 5. Cozaar 50 mg b.i.d. 6. Metoclopramide 10 mg 1/2 tablet before meals and at bed time. 7. Triamcinolone cream to be applied to an itchy rash on her arms.  ACTIVITY:  She was encouraged to exercise 20 minutes a day.  DIET:  She is to drink plenty of fluid.  No fried  foods, caffeine, spicy food, chocolate, peppermint, acid juices, tomatoes or onions.  FOLLOWUP:  She is to see Dr. Jake Michaelis in a week.  Follow up with Dr. Justine Null and Dr. Lajoyce Corners. DD:  10/22/00 TD:  10/23/00 Job: [id number]

## 2010-07-31 NOTE — Telephone Encounter (Signed)
Stress test is abnormal. She needs follow up with Dr. Harrington Challenger to discuss in the next 1 week.   If schedule is full, she can see me but must be on a day Dr. Harrington Challenger in office. Make sure she has NTG to use PRN. If she has recurrent or worsening chest pain, go to the ED.

## 2010-07-31 NOTE — Procedures (Signed)
NAMESURABHI, [name] NO.:  [id number]   MEDICAL RECORD NO.:  [id number]          PATIENT TYPE:  OUT   LOCATION:  SLEEP CENTER                 FACILITY:  [facility]   PHYSICIAN:  Danton Sewer, M.D. [facility] DATE OF BIRTH:  1950-04-05   DATE OF STUDY:  05/14/2005                              NOCTURNAL POLYSOMNOGRAM   REFERRING PHYSICIAN:  Dr. Merton Border.   DATE OF STUDY:  May 14, 2005.   INDICATION FOR STUDY:  Persistent disorder of initiating and maintaining  sleep.   EPWORTH SLEEPINESS SCORE:  9.   SLEEP ARCHITECTURE:  The patient had a total sleep time of 390 minutes with  no slow wave sleep and only 24 minutes of REM. Sleep onset latency was rapid  at 2-1/2 minutes and REM onset was quite prolonged at 299 minutes. Sleep  efficiency was 88%.   RESPIRATORY DATA:  The patient was found to have 18 obstructive hypopneas  and 1 obstructive apnea for respiratory disturbance index of only 3 events  per hour. The events all occurred in the supine position. There was moderate  to severe snoring noted throughout. There is also moderate numbers of  nonspecific arousals which raises the question of the upper airway  resistance syndrome when combined with the patient's moderate to severe  snoring.   OXYGEN DATA:  O2 desaturation was as low as 87%.   CARDIAC DATA:  No clinically significant cardiac arrhythmia.   MOVEMENT/PARASOMNIA:  The patient was found to have 82 leg jerks; however,  very few of these resulted in arousal or awakening.   IMPRESSION/RECOMMENDATIONS:  1.  Small numbers of obstructive events which do not meet the respiratory      disturbance index criteria for the obstructive sleep apnea syndrome.      However, the patient did have moderate numbers of nonspecific arousals      and moderate to severe snoring which may suggest the upper airway      resistant syndrome. Clinical correlation is      suggested.  2.  Moderate numbers of leg jerks with very  little sleep disruption.           ______________________________  Danton Sewer, M.D. [facility]  Diplomate, American Board of Sleep  Medicine     KC/MEDQ  D:  05/20/2005 16:55:05  T:  05/21/2005 01:04:58  Job:  [id number]

## 2010-07-31 NOTE — Assessment & Plan Note (Signed)
[name]                             PULMONARY OFFICE NOTE   [name], [name]                          MRN:          [id number]  DATE:05/23/2006                            DOB:          [birth year]    SUBJECTIVE:  This is a followup visit for [name] whose a 60 year old  woman with scleroderma and severe pulmonary hypertension. She follows up  today after being restarted on her Bosentan approximately 3 weeks ago.  She tells me that since restarting the medication her shortness of  breath is significant improved. She does still have some exertional  limitation but she feels that she is almost back to her previous  baseline from prior to the medication being discontinued. She also  reports that her vision is improving with better glycemic control. Her  new onset diabetes has been managed by Dr. Leward Quan at [facility].  She is following up with ophthalmology and does not have new glasses  yet. These will be ordered once her sugars have truly stabilized. She  has not tolerated sildenafil in the past due to visual changes. It was  not entirely clear whether the visual changes were due to her  hyperglycemia or the sildenafil but I do not believe that she is a  candidate to have this medication reinitiated.   MEDICATIONS:  1. Os-Cal D 500 mg b.i.d.  2. Premarin 0.625 mg daily.  3. Lisinopril 10 mg daily.  4. Digitek 0.125 mg daily.  5. Metolazone 2.5 mg daily.  6. Metoclopramide 5 mg q.a.c and q.h.s.  7. Bosentan 125 mg b.i.d.  8. Warfarin as directed.  9. Prednisone 5 mg b.i.d.  10.Lasix 20 mg b.i.d.  11.Lyrica 150 mg t.i.d.  12.Insulin sliding scale.  13.Amaril 2 mg daily.  14.Metformin 500 mg b.i.d.   PHYSICAL EXAMINATION:  GENERAL:  This pleasant, obese, African-American  woman whose in no distress.  VITAL SIGNS:  Her weight is 202 pounds which is down 6 pounds from our  last visit. Her temperature is 98.1, blood pressure 100/68,  heart rate  79, SPO2 95% on room air.  HEENT:  The oropharynx is clear.  NECK:  Large without any stridor.  LUNGS:  Clear to auscultation bilaterally. She has no wheezing on forced  expiration. There are no crackles heard.  HEART:  Regular rate and rhythm with a prominent P2. There is no murmur.  ABDOMEN:  Obese, soft, nontender with positive bowel sounds.  EXTREMITIES:  Trace edema. There is no clubbing.   IMPRESSION:  1. Scleroderma with resultant severe pulmonary hypertension currently      on Bosentan and Coumadin. She is improving and is close to her      previous baseline.  2. Diabetes mellitus.  3. Visual changes that appear to be improved with better control of      her hyperglycemia.   PLAN:  1. Will continue her Tracleer 125 mg b.i.d. as ordered.  2. I will check LFTs today and then monthly.  3. She is due for a 6 minute walk and a transthoracic echocardiogram  in June of this year.  4. I will follow up with [name] in 2 months to follow her symptoms      and her toleration of the medications. She will see me sooner if      she has any trouble in the interim.     Collene Gobble, MD  Electronically Signed    RSB/MedQ  DD: 05/23/2006  DT: 05/23/2006  Job #: [id number]   cc:   [name], M.D.  [name], M.D.

## 2010-07-31 NOTE — Assessment & Plan Note (Signed)
[place]                             PULMONARY OFFICE NOTE   CHALLIS, CRILL                          MRN:          [id number]  DATE:02/23/2006                            DOB:          1950/10/01    SUBJECTIVE:  [name] is a pleasant, 60 year old woman with  scleroderma and associated severe pulmonary hypertension who follows up  today for her regularly scheduled visit.  Since our last visit, she has  had a 6-minute walk and a repeat echocardiogram.  We performed these  studies earlier than originally scheduled because she has been having  more exertional dyspnea.  She tells me today that she continues to be  more short of breath when she exerts herself.  She is having difficulty  doing housework and can barely sweep the floor without needing to stop  to rest.  She also can get short of breath with very minimal activity  including getting in and out of the bed.  She sometimes is short of  breath when she lies supine and she occasionally wakes up short of  breath as well.  She denies any significant cough, nasal drainage, or  allergy symptoms.   CURRENT MEDICATIONS:  1. Os-Cal plus D 500 mg b.i.d.  2. Premarin 0.625 mg daily.  3. Lisinopril 10 mg daily.  4. Digitek 0.125 mg daily.  5. Metolazone 2.5 mg daily.  6. Metoclopramide 5 mg a.c. and nightly.  7. Bosentan 125 mg p.o. b.i.d.  8. Coumadin 2.5 mg daily.  9. Prednisone 5 mg b.i.d.  10.Lasix 20 mg b.i.d.  11.Lyrica 150 mg t.i.d.  12.Vicodin p.r.n. for pain.   EXAM:  GENERAL:  This is a pleasant, obese, African-American woman who  is in no distress on room air at rest.  Her weight is 221 pounds which is unchanged from our last visit and  which appears to have been stable dating back to June 2007.  Temperature  98.2.  Blood pressure 120/74.  Heart rate 90.  SPO2 97% on room air.  HEENT:  The oropharynx is moist and without lesion.  NECK:  Large but has no lymphadenopathy or  stridor.  LUNGS:  Small volumes but there are no wheezes or crackles heard.  HEART:  Regular rate and rhythm without murmur.  There is a prominent  S2.  ABDOMEN:  Soft, obese, benign.  EXTREMITIES:  No cyanosis or clubbing.  There is 1+ pitting edema.  I do  not see any evidence of skin tightening or changes consistent with  scleroderma.   Six-minute walk was performed on February 17, 2006.  She walked 252 m  without evidence of desaturation on room air.  This is compared with 258  m in July and 250 m in February 2007.  She also had repeat transthoracic  echocardiogram performed which showed a pulmonary arterial systolic  pressure of 90.  Her PA systolic pressure was estimated to be 83 in July  2007 and 91 in February 2007.   IMPRESSION:  Pulmonary artery hypertension in the setting of  scleroderma, currently on bosentan and  on anticoagulation with Coumadin.  She continues to have a decline in her functional status despite the  fact that her pulmonary arterial pressures and 6-minute walk are stable  compared with both July and February 2007.  I believe that she merits a  step up in her therapy.  We will try to initiate oral therapy at this  time.  However, if we do not see any benefits clinically or improvement  in her 6-minute walk and pulmonary arterial pressures, then it may be  necessary to refer her for intravenous therapy.   PLANS:  1. Overnight oximetry will be performed to insure that she is not      having occult desaturations at night.  Sleep study in the past has      shown no evidence for obstructive sleep apnea but she did have mild      desaturations as low as 87%.  2. We will start sildenafil 20 mg p.o. t.i.d.  We discussed the      possible side effects of this medication today.  3. Continue bosentan 125 mg b.i.d.  4. Continue anticoagulation with Coumadin.  5. Continue prednisone 5 mg b.i.d. for her scleroderma.  6. Followup in 1 month to assess our progress on  combination      sildenafil-bosentan therapy     Collene Gobble, MD  Electronically Signed    RSB/MedQ  DD: 02/23/2006  DT: 02/23/2006  Job #: [id number]   cc:   Zella Richer. Alva Garnet, MD

## 2010-08-03 ENCOUNTER — Telehealth: Payer: Self-pay | Admitting: Internal Medicine

## 2010-08-03 ENCOUNTER — Other Ambulatory Visit: Payer: Self-pay | Admitting: Emergency Medicine

## 2010-08-03 ENCOUNTER — Other Ambulatory Visit: Payer: Self-pay | Admitting: *Deleted

## 2010-08-03 LAB — HEPATIC FUNCTION PANEL
ALT: 23 U/L (ref 0–35)
AST: 27 U/L (ref 0–37)
Albumin: 3.5 g/dL (ref 3.5–5.2)
Alkaline Phosphatase: 58 U/L (ref 39–117)
Bilirubin, Direct: 0.1 mg/dL (ref 0.0–0.3)
Total Bilirubin: 0.4 mg/dL (ref 0.3–1.2)
Total Protein: 6.7 g/dL (ref 6.0–8.3)

## 2010-08-03 MED ORDER — BOSENTAN 125 MG PO TABS: 125.0000 mg | ORAL_TABLET | Freq: Two times a day (BID) | ORAL | Status: DC

## 2010-08-03 MED ORDER — TREPROSTINIL 0.6 MG/ML IN SOLN: RESPIRATORY_TRACT | Status: DC

## 2010-08-03 NOTE — Telephone Encounter (Signed)
Verbal refill authorization given to pharmacist at [facility].

## 2010-08-03 NOTE — Telephone Encounter (Signed)
LMTCB re stress test results. Dr. Harrington Challenger would like pt set up for heart cath -evaluate coronaries as stress test was abnormal. Horton Chin RN

## 2010-08-03 NOTE — Telephone Encounter (Signed)
Refill called to [facility] for Tyvaso.

## 2010-08-04 ENCOUNTER — Telehealth: Payer: Self-pay | Admitting: *Deleted

## 2010-08-04 ENCOUNTER — Encounter: Payer: Self-pay | Admitting: *Deleted

## 2010-08-04 DIAGNOSIS — R079 Chest pain, unspecified: Secondary | ICD-10-CM

## 2010-08-04 NOTE — Telephone Encounter (Signed)
See phone note

## 2010-08-04 NOTE — Telephone Encounter (Signed)
Pt scheduled for Cath on 08/11/10. Last dose of coumadin on 08/06/10. Start Lovenox on 08/08/10 130ms SQ daily. Discontinue Lovenox on 08/10/10 in AM.  Post-Cath Start coumadin on 08/11/10 with 519m then resume regular dose. Restart Lovenox on 08/12/10 15043mSQ daily. Return to [facility] on 08/17/10 for INR.

## 2010-08-05 ENCOUNTER — Other Ambulatory Visit: Payer: Self-pay | Admitting: *Deleted

## 2010-08-05 ENCOUNTER — Other Ambulatory Visit (INDEPENDENT_AMBULATORY_CARE_PROVIDER_SITE_OTHER): Payer: Medicare Other | Admitting: *Deleted

## 2010-08-05 ENCOUNTER — Encounter: Payer: Self-pay | Admitting: *Deleted

## 2010-08-05 DIAGNOSIS — R079 Chest pain, unspecified: Secondary | ICD-10-CM

## 2010-08-05 LAB — BASIC METABOLIC PANEL
BUN: 20 mg/dL (ref 6–23)
Calcium: 8.7 mg/dL (ref 8.4–10.5)
GFR: 90.53 mL/min (ref >60.00)
Glucose, Bld: 153 mg/dL — ABNORMAL HIGH (ref 70–99)
Sodium: 140 mEq/L (ref 135–145)

## 2010-08-05 LAB — CBC WITH DIFFERENTIAL/PLATELET
Basophils Absolute: 0 10*3/uL (ref 0.0–0.1)
Lymphocytes Relative: 23.4 % (ref 12.0–46.0)
Monocytes Relative: 7.4 % (ref 3.0–12.0)
Platelets: 213 10*3/uL (ref 150.0–400.0)
RDW: 14.1 % (ref 11.5–14.6)
WBC: 8.7 10*3/uL (ref 4.5–10.5)

## 2010-08-05 LAB — PROTIME-INR: Prothrombin Time: 28.6 s — ABNORMAL HIGH (ref 10.2–12.4)

## 2010-08-05 MED ORDER — PREDNISONE 20 MG PO TABS: 20.0000 mg | ORAL_TABLET | Freq: Every day | ORAL | Status: AC

## 2010-08-05 NOTE — Telephone Encounter (Signed)
RX PREDNISONE FOR CATH PRE MED

## 2010-08-05 NOTE — Telephone Encounter (Signed)
Pt scheduled for Cath on 08/11/10. Last dose of coumadin on 08/06/10. Start Lovenox on 08/08/10 19ms SQ daily. Discontinue Lovenox on 08/10/10 in AM.  Post-Cath Start coumadin on 08/11/10 with 566m then resume regular dose. Restart Lovenox on 08/12/10 15055mSQ daily. Return to [facility] on 08/17/10 for INR.

## 2010-08-05 NOTE — Telephone Encounter (Signed)
Pt telephoned back, explained to her the importance of having her blood drawn today for pending cath on 08/11/10. Also informed her that when she is here drawing her blood we can go over lovenox and coumadin instructions. Pt states she has a broke foot and trying to move and she will try to get here. Explained to her that labs have to be drawn today for cath and instructions have to be giving to her. She states she will do her best.

## 2010-08-05 NOTE — Telephone Encounter (Signed)
Pt came in today, she did have lab work done. Lovenox bridging instructions completed.

## 2010-08-05 NOTE — Telephone Encounter (Signed)
Pt scheduled for Cath on 08/11/10. Last dose of coumadin on 08/06/10. Start Lovenox on 08/08/10 130ms SQ daily. Discontinue Lovenox on 08/10/10 in AM.  Post-Cath Start coumadin on 08/11/10 with 519m then resume regular dose. Restart Lovenox on 08/12/10 15043mSQ daily. Return to [facility] on 08/17/10 for INR.

## 2010-08-06 ENCOUNTER — Ambulatory Visit: Payer: Medicare Other | Admitting: Emergency Medicine

## 2010-08-06 ENCOUNTER — Ambulatory Visit (HOSPITAL_BASED_OUTPATIENT_CLINIC_OR_DEPARTMENT_OTHER): Admission: RE | Admit: 2010-08-06 | Payer: Medicare Other | Source: Ambulatory Visit | Admitting: Podiatry

## 2010-08-11 ENCOUNTER — Inpatient Hospital Stay (HOSPITAL_BASED_OUTPATIENT_CLINIC_OR_DEPARTMENT_OTHER)
Admission: RE | Admit: 2010-08-11 | Discharge: 2010-08-11 | Disposition: A | Discharge status: Home / Self Care | Payer: Medicare Other | Source: Ambulatory Visit | Attending: Cardiology | Admitting: Cardiology

## 2010-08-11 DIAGNOSIS — R0989 Other specified symptoms and signs involving the circulatory and respiratory systems: Secondary | ICD-10-CM | POA: Insufficient documentation

## 2010-08-11 DIAGNOSIS — R0609 Other forms of dyspnea: Secondary | ICD-10-CM | POA: Insufficient documentation

## 2010-08-11 DIAGNOSIS — R9430 Abnormal result of cardiovascular function study, unspecified: Secondary | ICD-10-CM

## 2010-08-11 DIAGNOSIS — R9439 Abnormal result of other cardiovascular function study: Secondary | ICD-10-CM | POA: Insufficient documentation

## 2010-08-11 DIAGNOSIS — I2789 Other specified pulmonary heart diseases: Secondary | ICD-10-CM | POA: Insufficient documentation

## 2010-08-11 DIAGNOSIS — I10 Essential (primary) hypertension: Secondary | ICD-10-CM | POA: Insufficient documentation

## 2010-08-11 DIAGNOSIS — M349 Systemic sclerosis, unspecified: Secondary | ICD-10-CM | POA: Insufficient documentation

## 2010-08-11 DIAGNOSIS — E119 Type 2 diabetes mellitus without complications: Secondary | ICD-10-CM | POA: Insufficient documentation

## 2010-08-11 LAB — PROTIME-INR
INR: 1.05 (ref 0.00–1.49)
Prothrombin Time: 13.9 seconds (ref 11.6–15.2)

## 2010-08-11 LAB — POCT I-STAT GLUCOSE
Glucose, Bld: 169 mg/dL — ABNORMAL HIGH (ref 70–99)
Operator id: 221371

## 2010-08-14 HISTORY — PX: EXCISIONAL TOTAL KNEE ARTHROPLASTY WITH ANTIBIOTIC SPACERS: SHX5827

## 2010-08-17 ENCOUNTER — Encounter: Payer: Medicare Other | Admitting: *Deleted

## 2010-08-17 ENCOUNTER — Ambulatory Visit (INDEPENDENT_AMBULATORY_CARE_PROVIDER_SITE_OTHER): Payer: Medicare Other | Admitting: Internal Medicine

## 2010-08-17 ENCOUNTER — Ambulatory Visit (INDEPENDENT_AMBULATORY_CARE_PROVIDER_SITE_OTHER): Payer: Medicare Other | Admitting: *Deleted

## 2010-08-17 ENCOUNTER — Encounter: Payer: Self-pay | Admitting: Internal Medicine

## 2010-08-17 VITALS — BP 135/74 | HR 88 | Ht 64.0 in | Wt 219.0 lb

## 2010-08-17 DIAGNOSIS — I2789 Other specified pulmonary heart diseases: Secondary | ICD-10-CM

## 2010-08-17 DIAGNOSIS — D684 Acquired coagulation factor deficiency: Secondary | ICD-10-CM

## 2010-08-17 DIAGNOSIS — R0602 Shortness of breath: Secondary | ICD-10-CM

## 2010-08-17 DIAGNOSIS — R0609 Other forms of dyspnea: Secondary | ICD-10-CM

## 2010-08-17 LAB — BASIC METABOLIC PANEL
CO2: 23 mEq/L (ref 19–32)
Chloride: 105 mEq/L (ref 96–112)
Glucose, Bld: 162 mg/dL — ABNORMAL HIGH (ref 70–99)
Sodium: 139 mEq/L (ref 135–145)

## 2010-08-17 LAB — HEPATIC FUNCTION PANEL
Albumin: 3.4 g/dL — ABNORMAL LOW (ref 3.5–5.2)
Total Protein: 6.4 g/dL (ref 6.0–8.3)

## 2010-08-17 NOTE — Patient Instructions (Signed)
Your physician wants you to follow-up in: 12 months with Dr. Harrington Challenger. You will receive a reminder letter in the mail two months in advance. If you don't receive a letter, please call our office to schedule the follow-up appointment.

## 2010-08-17 NOTE — Progress Notes (Signed)
HPI  Patient is a 60 year old with a history of pulmonary hypertension, scleroderma.  She was last seen in clinic by Kathleen Argue. At that time she complained of chest pains.  She was set up for a myoview This showed ischemia in the anterior wall. She underwent cardiac catheterization last week.  Coronary arteries were normal.  LVEDP was 22. Since then she continues to have chest tightness with activity.  She is back on coumadin.   SHe does say that she is having vaginal bleeding .  She has not had before.  Also has vaginal itching. Denies dizziness.    Allergies  Allergen Reactions  . Cephalexin   . Ciprofloxacin   . Codeine     REACTION: GI upset  . Iohexol      Code: HIVES, Desc: pt breaks out in large hives. needs full premeds, Onset Date: [id number]     Current Outpatient Prescriptions  Medication Sig Dispense Refill  . Aspirin-Acetaminophen-Caffeine (EXCEDRIN MIGRAINE PO) Take by mouth. 035-009-38 Mg tabs as needed.       . bosentan (TRACLEER) 125 MG tablet Take 1 tablet (125 mg total) by mouth 2 (two) times daily.  60 tablet  3  . esomeprazole (NEXIUM) 40 MG capsule Take by mouth 2 (two) times daily.        Marland Kitchen estrogens, conjugated, (PREMARIN) 0.625 MG tablet Take 0.625 mg by mouth daily. Take daily for 21 days then do not take for 7 days.       . furosemide (LASIX) 20 MG tablet Take 20 mg by mouth. Take 2 tablets daily.       Marland Kitchen glimepiride (AMARYL) 4 MG tablet Take by mouth. 1 by mouth every am and 1/2 tablet every pm with meals.       Marland Kitchen HYDROCODONE-ACETAMINOPHEN PO Take by mouth. 10-650 mg tablets. Take 1 tablet 4 times a day as needed for pain.       Marland Kitchen LANOXIN 0.125 MG tablet take 1 tablet by mouth once daily  30 tablet  2  . LISINOPRIL PO Take by mouth. 10 mg daily.       . metFORMIN (GLUCOPHAGE) 500 MG tablet Take by mouth. 1 by mouth 2 times a day with meals.       Marland Kitchen METOCLOPRAMIDE HCL PO Take by mouth. 10 mg tablets. Take one-half tablet before meals and at bedtime.       .  metolazone (ZAROXOLYN) 2.5 MG tablet Take by mouth every morning.        . ONE TOUCH LANCETS MISC by Does not apply route. Check Blood Glucose 2-3 times a day.       . predniSONE (DELTASONE) 5 MG tablet Take by mouth daily. 1 Tablet 2 times a day.       . pregabalin (LYRICA) 150 MG capsule Take by mouth. 1 tablet 3 times a day.       . Treprostinil (TYVASO) 0.6 MG/ML SOLN 9 puffs four times daily as directed  3.6 mL  3  . warfarin (COUMADIN) 2.5 MG tablet take by mouth as directed BY COUMADIN CLINIC  100 tablet  3    Past Medical History  Diagnosis Date  . Secondary pulmonary hypertension     right heart cath 04/20/04  . Cough   . Allergic rhinitis, cause unspecified   . Type II or unspecified type diabetes mellitus without mention of complication, not stated as uncontrolled   . Degeneration of lumbar or lumbosacral intervertebral disc   .  Esophageal reflux   . Systemic sclerosis   . Unspecified essential hypertension   . Rotator cuff tear   . Gastritis   . Sickle cell trait   . Arthritis   . Obesity   . Deep venous thrombosis     comadin therapy  . Visual changes     Past Surgical History  Procedure Date  . Partial hysterectomy 1983  . Tubal ligation   . Tonsillectomy   . Upper endoscopy with biopsy 03/10/2006  . Right shoulder arthroscopy, subacromial decompression   . Cardiac catheterization 04/24/2004  . Replacement total knee 11/2000    Family History  Problem Relation Age of Onset  . Heart disease Mother   . Diabetes Mother   . Diabetes Sister     History   Social History  . Marital Status: Divorced    Spouse Name: N/A    Number of Children: 2  . Years of Education: N/A   Occupational History  . Umemployed    Social History Main Topics  . Smoking status: Never Smoker   . Smokeless tobacco: Not on file  . Alcohol Use: No  . Drug Use: No  . Sexually Active: Not on file   Other Topics Concern  . Not on file   Social History Narrative    FAMILY  HISTORY:  Significant for coronary artery disease and diabetes    Review of Systems:  All systems reviewed.  They are negative to the above problem except as previously stated.  Vital Signs: BP 135/74  Pulse 88  Ht _0  (1.626 m)  Wt 219 lb (99.338 kg)  BMI 37.59 kg/m2  Physical Exam  Patient is in NAD HEENT:  Normocephalic, atraumatic. EOMI, PERRLA.  Neck: JVP is normal. No thyromegaly. No bruits.  Lungs: clear to auscultation. No rales no wheezes.  Heart: Regular rate and rhythm. Normal S1, prominent P2. No S3.   No significant murmurs. PMI not displaced.  Abdomen:  Supple, nontender. Normal bowel sounds. No masses. No hepatomegaly.  Extremities:   Good distal pulses throughout. No lower extremity edema.  Musculoskeletal :moving all extremities.  Neuro:   alert and oriented x3.  CN II-XII grossly intact.   Assessment and Plan:

## 2010-08-17 NOTE — Assessment & Plan Note (Signed)
Pateint with continued dyspnea and chest tightness.  Myoview recently was falsely positive.  Cath without CAD.  Her LVEDP was increased.  Unfortunately a RHeart cath was not done. I would check BMET and BNP today.  Will discuss with R Byrum.

## 2010-08-17 NOTE — Assessment & Plan Note (Signed)
Followed by R. Lamonte Sakai.  Back on coumadin.  WIll forward to R Byrum.

## 2010-08-27 ENCOUNTER — Encounter: Payer: Medicare Other | Admitting: *Deleted

## 2010-09-04 ENCOUNTER — Ambulatory Visit: Payer: Medicare Other | Admitting: Emergency Medicine

## 2010-09-07 ENCOUNTER — Inpatient Hospital Stay (HOSPITAL_COMMUNITY)
Admission: AD | Admit: 2010-09-07 | Discharge: 2010-09-15 | DRG: 466 | Disposition: A | Discharge status: Skilled Nursing Facility | Payer: Medicare Other | Source: Ambulatory Visit | Attending: Orthopedic Surgery | Admitting: Orthopedic Surgery

## 2010-09-07 ENCOUNTER — Other Ambulatory Visit: Payer: Self-pay | Admitting: Emergency Medicine

## 2010-09-07 ENCOUNTER — Inpatient Hospital Stay (HOSPITAL_COMMUNITY): Payer: Medicare Other

## 2010-09-07 DIAGNOSIS — I2789 Other specified pulmonary heart diseases: Secondary | ICD-10-CM | POA: Diagnosis present

## 2010-09-07 DIAGNOSIS — I10 Essential (primary) hypertension: Secondary | ICD-10-CM | POA: Diagnosis present

## 2010-09-07 DIAGNOSIS — S92309A Fracture of unspecified metatarsal bone(s), unspecified foot, initial encounter for closed fracture: Secondary | ICD-10-CM | POA: Diagnosis present

## 2010-09-07 DIAGNOSIS — Z96659 Presence of unspecified artificial knee joint: Secondary | ICD-10-CM

## 2010-09-07 DIAGNOSIS — K219 Gastro-esophageal reflux disease without esophagitis: Secondary | ICD-10-CM | POA: Diagnosis present

## 2010-09-07 DIAGNOSIS — N179 Acute kidney failure, unspecified: Secondary | ICD-10-CM | POA: Diagnosis not present

## 2010-09-07 DIAGNOSIS — Y849 Medical procedure, unspecified as the cause of abnormal reaction of the patient, or of later complication, without mention of misadventure at the time of the procedure: Secondary | ICD-10-CM | POA: Diagnosis present

## 2010-09-07 DIAGNOSIS — R6521 Severe sepsis with septic shock: Secondary | ICD-10-CM | POA: Diagnosis present

## 2010-09-07 DIAGNOSIS — A419 Sepsis, unspecified organism: Secondary | ICD-10-CM | POA: Diagnosis present

## 2010-09-07 DIAGNOSIS — T84498A Other mechanical complication of other internal orthopedic devices, implants and grafts, initial encounter: Secondary | ICD-10-CM | POA: Diagnosis present

## 2010-09-07 DIAGNOSIS — T8450XA Infection and inflammatory reaction due to unspecified internal joint prosthesis, initial encounter: Principal | ICD-10-CM | POA: Diagnosis present

## 2010-09-07 LAB — COMPREHENSIVE METABOLIC PANEL
BUN: 24 mg/dL — ABNORMAL HIGH (ref 6–23)
CO2: 19 mEq/L (ref 19–32)
Chloride: 97 mEq/L (ref 96–112)
Creatinine, Ser: 1.2 mg/dL — ABNORMAL HIGH (ref 0.50–1.10)
GFR calc Af Amer: 56 mL/min — ABNORMAL LOW (ref >60)
GFR calc non Af Amer: 46 mL/min — ABNORMAL LOW (ref >60)
Glucose, Bld: 231 mg/dL — ABNORMAL HIGH (ref 70–99)
Total Bilirubin: 0.5 mg/dL (ref 0.3–1.2)

## 2010-09-07 LAB — PROTIME-INR
INR: 1.22 (ref 0.00–1.49)
Prothrombin Time: 15.7 seconds — ABNORMAL HIGH (ref 11.6–15.2)

## 2010-09-07 LAB — CBC
HCT: 29.8 % — ABNORMAL LOW (ref 36.0–46.0)
MCV: 81.6 fL (ref 78.0–100.0)
RBC: 3.65 MIL/uL — ABNORMAL LOW (ref 3.87–5.11)
WBC: 12.5 10*3/uL — ABNORMAL HIGH (ref 4.0–10.5)

## 2010-09-07 LAB — GLUCOSE, CAPILLARY: Glucose-Capillary: 218 mg/dL — ABNORMAL HIGH (ref 70–99)

## 2010-09-08 ENCOUNTER — Telehealth: Payer: Self-pay | Admitting: Emergency Medicine

## 2010-09-08 LAB — PROTIME-INR: Prothrombin Time: 15.1 seconds (ref 11.6–15.2)

## 2010-09-08 LAB — URINE MICROSCOPIC-ADD ON

## 2010-09-08 LAB — GLUCOSE, CAPILLARY: Glucose-Capillary: 138 mg/dL — ABNORMAL HIGH (ref 70–99)

## 2010-09-08 LAB — URINALYSIS, ROUTINE W REFLEX MICROSCOPIC
Nitrite: NEGATIVE
Specific Gravity, Urine: 1.022 (ref 1.005–1.030)
Urobilinogen, UA: 1 mg/dL (ref 0.0–1.0)

## 2010-09-08 LAB — ABO/RH: ABO/RH(D): O NEG

## 2010-09-08 NOTE — Telephone Encounter (Signed)
Thank you - will see her in the hospital

## 2010-09-08 NOTE — Telephone Encounter (Signed)
PT is in [facility], rm 5010 due to TKR and will be having additional surgery for sepsis on Wed., 6/27. Pt is refusing to continue on Tyvaso and pharmacist calling to make RB aware. Dr. Sharol Given is ortho surgeon. Pt told the surgeon she had not taken this medication for over 10 days or longer. Will forward to RB so he is aware.

## 2010-09-08 NOTE — Telephone Encounter (Signed)
Spoke with Helene Kelp and notified per last phone note RB is aware that the pt has not been taking tyvaso.

## 2010-09-09 DIAGNOSIS — A419 Sepsis, unspecified organism: Secondary | ICD-10-CM

## 2010-09-09 DIAGNOSIS — M009 Pyogenic arthritis, unspecified: Secondary | ICD-10-CM

## 2010-09-09 DIAGNOSIS — M349 Systemic sclerosis, unspecified: Secondary | ICD-10-CM

## 2010-09-09 DIAGNOSIS — I2789 Other specified pulmonary heart diseases: Secondary | ICD-10-CM

## 2010-09-09 DIAGNOSIS — R6520 Severe sepsis without septic shock: Secondary | ICD-10-CM

## 2010-09-09 DIAGNOSIS — R6521 Severe sepsis with septic shock: Secondary | ICD-10-CM

## 2010-09-09 LAB — URINE CULTURE
Culture  Setup Time: 201206261307
Culture: NO GROWTH

## 2010-09-09 LAB — GLUCOSE, CAPILLARY
Glucose-Capillary: 172 mg/dL — ABNORMAL HIGH (ref 70–99)
Glucose-Capillary: 185 mg/dL — ABNORMAL HIGH (ref 70–99)

## 2010-09-09 LAB — PROTIME-INR
INR: 1.26 (ref 0.00–1.49)
Prothrombin Time: 16.1 seconds — ABNORMAL HIGH (ref 11.6–15.2)

## 2010-09-10 ENCOUNTER — Inpatient Hospital Stay (HOSPITAL_COMMUNITY): Payer: Medicare Other

## 2010-09-10 DIAGNOSIS — T8450XA Infection and inflammatory reaction due to unspecified internal joint prosthesis, initial encounter: Secondary | ICD-10-CM

## 2010-09-10 LAB — CBC
MCH: 27.7 pg (ref 26.0–34.0)
MCHC: 34.7 g/dL (ref 30.0–36.0)
MCV: 81.1 fL (ref 78.0–100.0)
MCV: 79.9 fL (ref 78.0–100.0)
Platelets: 375 10*3/uL (ref 150–400)
Platelets: 336 10*3/uL (ref 150–400)
RBC: 3.02 MIL/uL — ABNORMAL LOW (ref 3.87–5.11)
RBC: 2.78 MIL/uL — ABNORMAL LOW (ref 3.87–5.11)
RDW: 14.1 % (ref 11.5–15.5)
WBC: 17.4 10*3/uL — ABNORMAL HIGH (ref 4.0–10.5)

## 2010-09-10 LAB — GLUCOSE, CAPILLARY
Glucose-Capillary: 149 mg/dL — ABNORMAL HIGH (ref 70–99)
Glucose-Capillary: 115 mg/dL — ABNORMAL HIGH (ref 70–99)

## 2010-09-10 LAB — CARDIAC PANEL(CRET KIN+CKTOT+MB+TROPI)
CK, MB: 6.4 ng/mL (ref 0.3–4.0)
Total CK: 1781 U/L — ABNORMAL HIGH (ref 7–177)
Troponin I: <0.3 ng/mL (ref <0.30)

## 2010-09-10 LAB — LACTIC ACID, PLASMA: Lactic Acid, Venous: 1.4 mmol/L (ref 0.5–2.2)

## 2010-09-10 LAB — CARBOXYHEMOGLOBIN
Carboxyhemoglobin: 1.6 % — ABNORMAL HIGH (ref 0.5–1.5)
O2 Saturation: 86.5 %
Total hemoglobin: 7 g/dL — ABNORMAL LOW (ref 12.5–16.0)

## 2010-09-10 LAB — BLOOD GAS, ARTERIAL
Bicarbonate: 19 mEq/L — ABNORMAL LOW (ref 20.0–24.0)
TCO2: 19.8 mmol/L (ref 0–100)
pH, Arterial: 7.475 — ABNORMAL HIGH (ref 7.350–7.400)
pO2, Arterial: 69.6 mmHg — ABNORMAL LOW (ref 80.0–100.0)

## 2010-09-10 LAB — BASIC METABOLIC PANEL
CO2: 18 mEq/L — ABNORMAL LOW (ref 19–32)
Chloride: 101 mEq/L (ref 96–112)
Glucose, Bld: 112 mg/dL — ABNORMAL HIGH (ref 70–99)
Potassium: 4.6 mEq/L (ref 3.5–5.1)
Sodium: 133 mEq/L — ABNORMAL LOW (ref 135–145)

## 2010-09-10 LAB — EXPECTORATED SPUTUM ASSESSMENT W GRAM STAIN, RFLX TO RESP C

## 2010-09-10 LAB — PROCALCITONIN: Procalcitonin: 21.95 ng/mL

## 2010-09-10 LAB — URINALYSIS, ROUTINE W REFLEX MICROSCOPIC
Ketones, ur: NEGATIVE mg/dL
Nitrite: NEGATIVE
Protein, ur: 100 mg/dL — AB
Urobilinogen, UA: 1 mg/dL (ref 0.0–1.0)
pH: 5.5 (ref 5.0–8.0)

## 2010-09-10 LAB — COMPREHENSIVE METABOLIC PANEL
AST: 57 U/L — ABNORMAL HIGH (ref 0–37)
Albumin: 1.7 g/dL — ABNORMAL LOW (ref 3.5–5.2)
BUN: 28 mg/dL — ABNORMAL HIGH (ref 6–23)
Calcium: 7 mg/dL — ABNORMAL LOW (ref 8.4–10.5)
Creatinine, Ser: 3.93 mg/dL — ABNORMAL HIGH (ref 0.50–1.10)
GFR calc non Af Amer: 12 mL/min — ABNORMAL LOW (ref >60)

## 2010-09-10 LAB — PROTIME-INR: INR: 1.34 (ref 0.00–1.49)

## 2010-09-11 ENCOUNTER — Inpatient Hospital Stay (HOSPITAL_COMMUNITY): Payer: Medicare Other

## 2010-09-11 ENCOUNTER — Other Ambulatory Visit: Payer: Self-pay | Admitting: Orthopedic Surgery

## 2010-09-11 DIAGNOSIS — I059 Rheumatic mitral valve disease, unspecified: Secondary | ICD-10-CM

## 2010-09-11 LAB — CARDIAC PANEL(CRET KIN+CKTOT+MB+TROPI)
Relative Index: 0.5 (ref 0.0–2.5)
Relative Index: 0.3 (ref 0.0–2.5)
Total CK: 5104 U/L — ABNORMAL HIGH (ref 7–177)
Troponin I: <0.3 ng/mL (ref <0.30)

## 2010-09-11 LAB — DIFFERENTIAL
Eosinophils Absolute: 0.2 10*3/uL (ref 0.0–0.7)
Lymphs Abs: 2.3 10*3/uL (ref 0.7–4.0)
Monocytes Absolute: 2.3 10*3/uL — ABNORMAL HIGH (ref 0.1–1.0)
Monocytes Relative: 11 % (ref 3–12)

## 2010-09-11 LAB — CBC
MCH: 28.5 pg (ref 26.0–34.0)
MCHC: 35.5 g/dL (ref 30.0–36.0)
MCV: 81.1 fL (ref 78.0–100.0)
MCV: 80.1 fL (ref 78.0–100.0)
Platelets: 297 10*3/uL (ref 150–400)
Platelets: 339 10*3/uL (ref 150–400)
RBC: 2.59 MIL/uL — ABNORMAL LOW (ref 3.87–5.11)
RDW: 14.1 % (ref 11.5–15.5)
RDW: 14.2 % (ref 11.5–15.5)
WBC: 18.6 10*3/uL — ABNORMAL HIGH (ref 4.0–10.5)

## 2010-09-11 LAB — CROSSMATCH: Unit division: 0

## 2010-09-11 LAB — HEPATIC FUNCTION PANEL
AST: 108 U/L — ABNORMAL HIGH (ref 0–37)
Bilirubin, Direct: 2.5 mg/dL — ABNORMAL HIGH (ref 0.0–0.3)
Indirect Bilirubin: 0.2 mg/dL — ABNORMAL LOW (ref 0.3–0.9)
Total Bilirubin: 2.7 mg/dL — ABNORMAL HIGH (ref 0.3–1.2)

## 2010-09-11 LAB — BASIC METABOLIC PANEL
BUN: 31 mg/dL — ABNORMAL HIGH (ref 6–23)
Chloride: 100 mEq/L (ref 96–112)
Creatinine, Ser: 3.83 mg/dL — ABNORMAL HIGH (ref 0.50–1.10)
GFR calc Af Amer: 15 mL/min — ABNORMAL LOW (ref >60)
GFR calc non Af Amer: 12 mL/min — ABNORMAL LOW (ref >60)
Potassium: 4.5 mEq/L (ref 3.5–5.1)

## 2010-09-11 LAB — GLUCOSE, CAPILLARY
Glucose-Capillary: 225 mg/dL — ABNORMAL HIGH (ref 70–99)
Glucose-Capillary: 179 mg/dL — ABNORMAL HIGH (ref 70–99)
Glucose-Capillary: 152 mg/dL — ABNORMAL HIGH (ref 70–99)

## 2010-09-11 LAB — D-DIMER, QUANTITATIVE: D-Dimer, Quant: <20 ug/mL-FEU — ABNORMAL HIGH (ref 0.00–0.48)

## 2010-09-12 ENCOUNTER — Inpatient Hospital Stay (HOSPITAL_COMMUNITY): Payer: Medicare Other

## 2010-09-12 LAB — COMPREHENSIVE METABOLIC PANEL
ALT: 42 U/L — ABNORMAL HIGH (ref 0–35)
Alkaline Phosphatase: 80 U/L (ref 39–117)
CO2: 18 mEq/L — ABNORMAL LOW (ref 19–32)
GFR calc Af Amer: 19 mL/min — ABNORMAL LOW (ref >60)
GFR calc non Af Amer: 15 mL/min — ABNORMAL LOW (ref >60)
Glucose, Bld: 283 mg/dL — ABNORMAL HIGH (ref 70–99)
Potassium: 3.6 mEq/L (ref 3.5–5.1)
Sodium: 132 mEq/L — ABNORMAL LOW (ref 135–145)

## 2010-09-12 LAB — PREPARE RBC (CROSSMATCH)

## 2010-09-12 LAB — CBC
HCT: 18.8 % — ABNORMAL LOW (ref 36.0–46.0)
MCH: 28.9 pg (ref 26.0–34.0)
MCHC: 36.2 g/dL — ABNORMAL HIGH (ref 30.0–36.0)
RDW: 14.5 % (ref 11.5–15.5)

## 2010-09-12 LAB — VANCOMYCIN, RANDOM: Vancomycin Rm: 22.3 ug/mL

## 2010-09-12 LAB — GLUCOSE, CAPILLARY: Glucose-Capillary: 244 mg/dL — ABNORMAL HIGH (ref 70–99)

## 2010-09-12 LAB — OSMOLALITY: Osmolality: 293 mOsm/kg (ref 275–300)

## 2010-09-13 ENCOUNTER — Inpatient Hospital Stay (HOSPITAL_COMMUNITY): Payer: Medicare Other

## 2010-09-13 HISTORY — PX: PERIPHERALLY INSERTED CENTRAL CATHETER INSERTION: SHX2221

## 2010-09-13 LAB — CBC
MCH: 27.5 pg (ref 26.0–34.0)
Platelets: 380 10*3/uL (ref 150–400)
RBC: 2.98 MIL/uL — ABNORMAL LOW (ref 3.87–5.11)
RDW: 14.4 % (ref 11.5–15.5)
WBC: 13.7 10*3/uL — ABNORMAL HIGH (ref 4.0–10.5)

## 2010-09-13 LAB — CROSSMATCH
ABO/RH(D): O NEG
Unit division: 0

## 2010-09-13 LAB — GLUCOSE, CAPILLARY
Glucose-Capillary: 176 mg/dL — ABNORMAL HIGH (ref 70–99)
Glucose-Capillary: 141 mg/dL — ABNORMAL HIGH (ref 70–99)

## 2010-09-13 LAB — BASIC METABOLIC PANEL
BUN: 44 mg/dL — ABNORMAL HIGH (ref 6–23)
Calcium: 8.1 mg/dL — ABNORMAL LOW (ref 8.4–10.5)
GFR calc Af Amer: 22 mL/min — ABNORMAL LOW (ref >60)
GFR calc non Af Amer: 18 mL/min — ABNORMAL LOW (ref >60)
Potassium: 3.3 mEq/L — ABNORMAL LOW (ref 3.5–5.1)
Sodium: 136 mEq/L (ref 135–145)

## 2010-09-13 LAB — PROTIME-INR: Prothrombin Time: 16.2 seconds — ABNORMAL HIGH (ref 11.6–15.2)

## 2010-09-14 ENCOUNTER — Encounter: Payer: Medicare Other | Admitting: *Deleted

## 2010-09-14 DIAGNOSIS — Z96659 Presence of unspecified artificial knee joint: Secondary | ICD-10-CM

## 2010-09-14 DIAGNOSIS — M009 Pyogenic arthritis, unspecified: Secondary | ICD-10-CM

## 2010-09-14 LAB — CBC
MCV: 81 fL (ref 78.0–100.0)
Platelets: 423 10*3/uL — ABNORMAL HIGH (ref 150–400)
RBC: 3.06 MIL/uL — ABNORMAL LOW (ref 3.87–5.11)
RDW: 14.6 % (ref 11.5–15.5)
WBC: 12.3 10*3/uL — ABNORMAL HIGH (ref 4.0–10.5)

## 2010-09-14 LAB — BASIC METABOLIC PANEL
Calcium: 8.7 mg/dL (ref 8.4–10.5)
Creatinine, Ser: 2.43 mg/dL — ABNORMAL HIGH (ref 0.50–1.10)
GFR calc Af Amer: 25 mL/min — ABNORMAL LOW (ref >60)
GFR calc non Af Amer: 20 mL/min — ABNORMAL LOW (ref >60)
Sodium: 137 mEq/L (ref 135–145)

## 2010-09-14 LAB — PROTIME-INR
INR: 1.34 (ref 0.00–1.49)
Prothrombin Time: 16.8 seconds — ABNORMAL HIGH (ref 11.6–15.2)

## 2010-09-14 LAB — GLUCOSE, CAPILLARY
Glucose-Capillary: 192 mg/dL — ABNORMAL HIGH (ref 70–99)
Glucose-Capillary: 156 mg/dL — ABNORMAL HIGH (ref 70–99)

## 2010-09-15 DIAGNOSIS — I2789 Other specified pulmonary heart diseases: Secondary | ICD-10-CM

## 2010-09-15 LAB — BASIC METABOLIC PANEL
CO2: 22 mEq/L (ref 19–32)
Calcium: 9.1 mg/dL (ref 8.4–10.5)
Glucose, Bld: 195 mg/dL — ABNORMAL HIGH (ref 70–99)
Potassium: 3.5 mEq/L (ref 3.5–5.1)
Sodium: 139 mEq/L (ref 135–145)

## 2010-09-15 LAB — PROTIME-INR: INR: 1.43 (ref 0.00–1.49)

## 2010-09-17 LAB — CULTURE, BLOOD (ROUTINE X 2)
Culture  Setup Time: 201206290410
Culture: NO GROWTH

## 2010-09-18 ENCOUNTER — Emergency Department (HOSPITAL_COMMUNITY): Payer: Medicare Other

## 2010-09-18 ENCOUNTER — Inpatient Hospital Stay (HOSPITAL_COMMUNITY)
Admission: EM | Admit: 2010-09-18 | Discharge: 2010-09-28 | DRG: 872 | Disposition: A | Discharge status: Home Health | Payer: Medicare Other | Attending: Internal Medicine | Admitting: Internal Medicine

## 2010-09-18 ENCOUNTER — Ambulatory Visit (INDEPENDENT_AMBULATORY_CARE_PROVIDER_SITE_OTHER): Payer: Self-pay | Admitting: Cardiovascular Disease

## 2010-09-18 DIAGNOSIS — I129 Hypertensive chronic kidney disease with stage 1 through stage 4 chronic kidney disease, or unspecified chronic kidney disease: Secondary | ICD-10-CM | POA: Diagnosis present

## 2010-09-18 DIAGNOSIS — D649 Anemia, unspecified: Secondary | ICD-10-CM | POA: Diagnosis present

## 2010-09-18 DIAGNOSIS — I2789 Other specified pulmonary heart diseases: Secondary | ICD-10-CM | POA: Diagnosis present

## 2010-09-18 DIAGNOSIS — IMO0002 Reserved for concepts with insufficient information to code with codable children: Secondary | ICD-10-CM

## 2010-09-18 DIAGNOSIS — N390 Urinary tract infection, site not specified: Secondary | ICD-10-CM | POA: Diagnosis present

## 2010-09-18 DIAGNOSIS — K3184 Gastroparesis: Secondary | ICD-10-CM | POA: Diagnosis present

## 2010-09-18 DIAGNOSIS — M349 Systemic sclerosis, unspecified: Secondary | ICD-10-CM | POA: Diagnosis present

## 2010-09-18 DIAGNOSIS — A419 Sepsis, unspecified organism: Principal | ICD-10-CM | POA: Diagnosis present

## 2010-09-18 DIAGNOSIS — E785 Hyperlipidemia, unspecified: Secondary | ICD-10-CM | POA: Diagnosis present

## 2010-09-18 DIAGNOSIS — N179 Acute kidney failure, unspecified: Secondary | ICD-10-CM | POA: Diagnosis present

## 2010-09-18 DIAGNOSIS — E669 Obesity, unspecified: Secondary | ICD-10-CM | POA: Diagnosis present

## 2010-09-18 DIAGNOSIS — R5381 Other malaise: Secondary | ICD-10-CM | POA: Diagnosis present

## 2010-09-18 DIAGNOSIS — R0989 Other specified symptoms and signs involving the circulatory and respiratory systems: Secondary | ICD-10-CM

## 2010-09-18 DIAGNOSIS — E1149 Type 2 diabetes mellitus with other diabetic neurological complication: Secondary | ICD-10-CM | POA: Diagnosis present

## 2010-09-18 DIAGNOSIS — N183 Chronic kidney disease, stage 3 unspecified: Secondary | ICD-10-CM | POA: Diagnosis present

## 2010-09-18 DIAGNOSIS — K219 Gastro-esophageal reflux disease without esophagitis: Secondary | ICD-10-CM | POA: Diagnosis present

## 2010-09-18 DIAGNOSIS — R197 Diarrhea, unspecified: Secondary | ICD-10-CM | POA: Diagnosis present

## 2010-09-18 DIAGNOSIS — M009 Pyogenic arthritis, unspecified: Secondary | ICD-10-CM | POA: Diagnosis present

## 2010-09-18 DIAGNOSIS — E1169 Type 2 diabetes mellitus with other specified complication: Secondary | ICD-10-CM | POA: Diagnosis not present

## 2010-09-18 DIAGNOSIS — M3481 Systemic sclerosis with lung involvement: Secondary | ICD-10-CM | POA: Diagnosis present

## 2010-09-18 DIAGNOSIS — Z7901 Long term (current) use of anticoagulants: Secondary | ICD-10-CM

## 2010-09-18 DIAGNOSIS — Z8249 Family history of ischemic heart disease and other diseases of the circulatory system: Secondary | ICD-10-CM

## 2010-09-18 DIAGNOSIS — D573 Sickle-cell trait: Secondary | ICD-10-CM | POA: Diagnosis present

## 2010-09-18 LAB — URINALYSIS, ROUTINE W REFLEX MICROSCOPIC
Glucose, UA: NEGATIVE mg/dL
Nitrite: NEGATIVE
Protein, ur: 100 mg/dL — AB
Urobilinogen, UA: 0.2 mg/dL (ref 0.0–1.0)

## 2010-09-18 LAB — URINE MICROSCOPIC-ADD ON

## 2010-09-18 LAB — CBC
Hemoglobin: 9.3 g/dL — ABNORMAL LOW (ref 12.0–15.0)
MCHC: 33.8 g/dL (ref 30.0–36.0)
Platelets: 428 10*3/uL — ABNORMAL HIGH (ref 150–400)
RDW: 15.2 % (ref 11.5–15.5)

## 2010-09-18 LAB — BASIC METABOLIC PANEL
BUN: 26 mg/dL — ABNORMAL HIGH (ref 6–23)
CO2: 22 mEq/L (ref 19–32)
Chloride: 102 mEq/L (ref 96–112)
Creatinine, Ser: 1.89 mg/dL — ABNORMAL HIGH (ref 0.50–1.10)

## 2010-09-18 LAB — DIFFERENTIAL
Basophils Absolute: 0 10*3/uL (ref 0.0–0.1)
Basophils Relative: 0 % (ref 0–1)
Eosinophils Absolute: 0.5 10*3/uL (ref 0.0–0.7)
Monocytes Absolute: 1.5 10*3/uL — ABNORMAL HIGH (ref 0.1–1.0)
Neutro Abs: 12.4 10*3/uL — ABNORMAL HIGH (ref 1.7–7.7)

## 2010-09-18 LAB — POCT INR: INR: 1.8

## 2010-09-19 LAB — CBC
Hemoglobin: 8.7 g/dL — ABNORMAL LOW (ref 12.0–15.0)
MCH: 27.7 pg (ref 26.0–34.0)
MCV: 82.5 fL (ref 78.0–100.0)
RBC: 3.14 MIL/uL — ABNORMAL LOW (ref 3.87–5.11)
WBC: 12.8 10*3/uL — ABNORMAL HIGH (ref 4.0–10.5)

## 2010-09-19 LAB — PROTIME-INR: INR: 1.69 — ABNORMAL HIGH (ref 0.00–1.49)

## 2010-09-19 LAB — SEDIMENTATION RATE: Sed Rate: 110 mm/hr — ABNORMAL HIGH (ref 0–22)

## 2010-09-19 LAB — BASIC METABOLIC PANEL
CO2: 21 mEq/L (ref 19–32)
GFR calc non Af Amer: 29 mL/min — ABNORMAL LOW (ref >60)
Glucose, Bld: 114 mg/dL — ABNORMAL HIGH (ref 70–99)
Potassium: 3.2 mEq/L — ABNORMAL LOW (ref 3.5–5.1)
Sodium: 142 mEq/L (ref 135–145)

## 2010-09-19 LAB — URINE CULTURE
Colony Count: NO GROWTH
Culture  Setup Time: 201207061935

## 2010-09-19 LAB — GLUCOSE, CAPILLARY
Glucose-Capillary: 147 mg/dL — ABNORMAL HIGH (ref 70–99)
Glucose-Capillary: 151 mg/dL — ABNORMAL HIGH (ref 70–99)

## 2010-09-20 ENCOUNTER — Inpatient Hospital Stay (HOSPITAL_COMMUNITY): Payer: Medicare Other

## 2010-09-20 LAB — CBC
HCT: 24.2 % — ABNORMAL LOW (ref 36.0–46.0)
Hemoglobin: 8.1 g/dL — ABNORMAL LOW (ref 12.0–15.0)
MCH: 27.6 pg (ref 26.0–34.0)
MCHC: 33.5 g/dL (ref 30.0–36.0)
MCV: 82.6 fL (ref 78.0–100.0)
Platelets: 306 10*3/uL (ref 150–400)
RBC: 2.93 MIL/uL — ABNORMAL LOW (ref 3.87–5.11)
RDW: 15.4 % (ref 11.5–15.5)
WBC: 11.8 10*3/uL — ABNORMAL HIGH (ref 4.0–10.5)

## 2010-09-20 LAB — BASIC METABOLIC PANEL
BUN: 22 mg/dL (ref 6–23)
CO2: 22 mEq/L (ref 19–32)
Calcium: 8.3 mg/dL — ABNORMAL LOW (ref 8.4–10.5)
Chloride: 105 mEq/L (ref 96–112)
Creatinine, Ser: 1.99 mg/dL — ABNORMAL HIGH (ref 0.50–1.10)
GFR calc Af Amer: 31 mL/min — ABNORMAL LOW (ref >60)
GFR calc non Af Amer: 26 mL/min — ABNORMAL LOW (ref >60)
Glucose, Bld: 151 mg/dL — ABNORMAL HIGH (ref 70–99)
Potassium: 3.4 mEq/L — ABNORMAL LOW (ref 3.5–5.1)
Sodium: 140 mEq/L (ref 135–145)

## 2010-09-20 LAB — DIFFERENTIAL
Basophils Absolute: 0 10*3/uL (ref 0.0–0.1)
Basophils Relative: 0 % (ref 0–1)
Eosinophils Absolute: 0.4 10*3/uL (ref 0.0–0.7)
Eosinophils Relative: 4 % (ref 0–5)
Lymphocytes Relative: 5 % — ABNORMAL LOW (ref 12–46)
Lymphs Abs: 0.6 10*3/uL — ABNORMAL LOW (ref 0.7–4.0)
Monocytes Absolute: 1.1 10*3/uL — ABNORMAL HIGH (ref 0.1–1.0)
Monocytes Relative: 9 % (ref 3–12)
Neutro Abs: 9.7 10*3/uL — ABNORMAL HIGH (ref 1.7–7.7)
Neutrophils Relative %: 82 % — ABNORMAL HIGH (ref 43–77)

## 2010-09-20 LAB — GLUCOSE, CAPILLARY
Glucose-Capillary: 133 mg/dL — ABNORMAL HIGH (ref 70–99)
Glucose-Capillary: 168 mg/dL — ABNORMAL HIGH (ref 70–99)
Glucose-Capillary: 118 mg/dL — ABNORMAL HIGH (ref 70–99)
Glucose-Capillary: 126 mg/dL — ABNORMAL HIGH (ref 70–99)

## 2010-09-20 LAB — IRON AND TIBC
Iron: 25 ug/dL — ABNORMAL LOW (ref 42–135)
UIBC: 196 ug/dL

## 2010-09-20 LAB — FERRITIN: Ferritin: 1597 ng/mL — ABNORMAL HIGH (ref 10–291)

## 2010-09-20 LAB — APTT: aPTT: 51 seconds — ABNORMAL HIGH (ref 24–37)

## 2010-09-20 LAB — PROTIME-INR: INR: 1.63 — ABNORMAL HIGH (ref 0.00–1.49)

## 2010-09-21 ENCOUNTER — Encounter: Payer: Self-pay | Admitting: Emergency Medicine

## 2010-09-21 DIAGNOSIS — R509 Fever, unspecified: Secondary | ICD-10-CM

## 2010-09-21 LAB — BASIC METABOLIC PANEL
BUN: 23 mg/dL (ref 6–23)
Calcium: 8.2 mg/dL — ABNORMAL LOW (ref 8.4–10.5)
GFR calc non Af Amer: 26 mL/min — ABNORMAL LOW (ref >60)
Glucose, Bld: 127 mg/dL — ABNORMAL HIGH (ref 70–99)
Sodium: 137 mEq/L (ref 135–145)

## 2010-09-21 LAB — DIFFERENTIAL
Basophils Relative: 0 % (ref 0–1)
Eosinophils Absolute: 0.4 10*3/uL (ref 0.0–0.7)
Eosinophils Relative: 5 % (ref 0–5)
Monocytes Absolute: 0.9 10*3/uL (ref 0.1–1.0)
Monocytes Relative: 10 % (ref 3–12)
Neutro Abs: 7.1 10*3/uL (ref 1.7–7.7)

## 2010-09-21 LAB — CBC
Hemoglobin: 7.4 g/dL — ABNORMAL LOW (ref 12.0–15.0)
MCH: 27.9 pg (ref 26.0–34.0)
MCHC: 33.6 g/dL (ref 30.0–36.0)
RDW: 15.7 % — ABNORMAL HIGH (ref 11.5–15.5)

## 2010-09-21 LAB — HEPATIC FUNCTION PANEL
Albumin: 2 g/dL — ABNORMAL LOW (ref 3.5–5.2)
Total Protein: 6.6 g/dL (ref 6.0–8.3)

## 2010-09-21 LAB — GLUCOSE, CAPILLARY
Glucose-Capillary: 100 mg/dL — ABNORMAL HIGH (ref 70–99)
Glucose-Capillary: 81 mg/dL (ref 70–99)

## 2010-09-21 NOTE — H&P (Signed)
[name] is an 60 y.o. female.     Chief Complaint: FEVER HPI: [name] is a 60 year old woman with scleroderma and severe secondary pulmonary hypertension. She had been treated with bosentan + Coumadin.  Initiated Ventavis and Adcirca in the past but unable to tolerate. Finally started on inhaled Tyvaso in 2010. Some delay in titrating up to goal dosing of 9 puffs 4x a day due to compliance issues, but finally able to do so.   By TTE, her PASP improved from 102 mmHg (7/09) to 70mHg (9/10),  6 minute walk improved to 3275m11/2010).   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 That hospitalization ultimately resulted in ICU stay for sepsis, c/b renal insufficiency. TTE on June 29 showed estimated RVSP of 63 mmHg (off Tyvaso). We sent her home on bosentan + coumadin, with plans to restart Tyvaso as outpt once she stabilized.  She went home on IV Abx.   Readmitted now with fever and leukocytosis, S Cr 1.8 - 2.0. Pulm/CCM consulted to assist w her management of PAH medications.   Past Medical History  Diagnosis Date  . Secondary pulmonary hypertension     right heart cath 04/20/04  . Cough   . Allergic rhinitis, cause unspecified   . Type II or unspecified type diabetes mellitus without mention of complication, not stated as uncontrolled   . Degeneration of lumbar or lumbosacral intervertebral disc   . Esophageal reflux   . Systemic sclerosis   . Unspecified essential hypertension   . Rotator cuff tear   . Gastritis   . Sickle cell trait   . Arthritis   . Obesity   . Deep venous thrombosis     comadin therapy  . Visual changes     Past Surgical History  Procedure Date  . Partial hysterectomy 1983  . Tubal ligation   . Tonsillectomy   . Upper endoscopy with biopsy 03/10/2006  . Right  shoulder arthroscopy, subacromial decompression   . Cardiac catheterization 04/24/2004  . Replacement total knee 11/2000    Family History  Problem Relation Age of Onset  . Heart disease Mother   . Diabetes Mother   . Diabetes Sister    Social History:  reports that she has never smoked. She does not have any smokeless tobacco history on file. She reports that she does not drink alcohol or use illicit drugs.  Allergies:  Allergies  Allergen Reactions  . Cephalexin   . Ciprofloxacin   . Codeine     REACTION: GI upset  . Iohexol      Code: HIVES, Desc: pt breaks out in large hives. needs full premeds, Onset Date: [id number]   No current facility-administered medications on file as of 09/21/2010.   No current outpatient prescriptions on file as of 09/21/2010.    Results for orders placed during the hospital encounter of 09/18/10 (from the past 48 hour(s))  GLUCOSE, CAPILLARY     Status: Abnormal   Collection Time   09/19/10  5:43 PM      Component Value Range Comment   Glucose-Capillary 147 (*) 70 - 99 (mg/dL)   GLUCOSE, CAPILLARY     Status: Abnormal   Collection Time   09/19/10 10:40 PM      Component Value Range Comment  Glucose-Capillary 151 (*) 70 - 99 (mg/dL)   DIFFERENTIAL     Status: Abnormal   Collection Time   09/20/10  5:00 AM      Component Value Range Comment   Neutrophils Relative 82 (*) 43 - 77 (%)    Neutro Abs 9.7 (*) 1.7 - 7.7 (K/uL)    Lymphocytes Relative 5 (*) 12 - 46 (%)    Lymphs Abs 0.6 (*) 0.7 - 4.0 (K/uL)    Monocytes Relative 9  3 - 12 (%)    Monocytes Absolute 1.1 (*) 0.1 - 1.0 (K/uL)    Eosinophils Relative 4  0 - 5 (%)    Eosinophils Absolute 0.4  0.0 - 0.7 (K/uL)    Basophils Relative 0  0 - 1 (%)    Basophils Absolute 0.0  0.0 - 0.1 (K/uL)   CBC     Status: Abnormal   Collection Time   09/20/10  5:00 AM      Component Value Range Comment   WBC 11.8 (*) 4.0 - 10.5 (K/uL)    RBC 2.93 (*) 3.87 - 5.11 (MIL/uL)    Hemoglobin 8.1 (*) 12.0 - 15.0  (g/dL)    HCT 24.2 (*) 36.0 - 46.0 (%)    MCV 82.6  78.0 - 100.0 (fL)    MCH 27.6  26.0 - 34.0 (pg)    MCHC 33.5  30.0 - 36.0 (g/dL)    RDW 15.4  11.5 - 15.5 (%)    Platelets 306  150 - 400 (K/uL)   APTT     Status: Abnormal   Collection Time   09/20/10  5:00 AM      Component Value Range Comment   aPTT 51 (*) 24 - 37 (seconds)   BASIC METABOLIC PANEL     Status: Abnormal   Collection Time   09/20/10  5:00 AM      Component Value Range Comment   Sodium 140  135 - 145 (mEq/L)    Potassium 3.4 (*) 3.5 - 5.1 (mEq/L)    Chloride 105  96 - 112 (mEq/L)    CO2 22  19 - 32 (mEq/L)    Glucose, Bld 151 (*) 70 - 99 (mg/dL)    BUN 22  6 - 23 (mg/dL)    Creatinine, Ser 1.99 (*) 0.50 - 1.10 (mg/dL) **Please note change in reference range.**   Calcium 8.3 (*) 8.4 - 10.5 (mg/dL)    GFR calc non Af Amer 26 (*) >60 (mL/min)    GFR calc Af Amer 31 (*) >60 (mL/min)   GLUCOSE, CAPILLARY     Status: Abnormal   Collection Time   09/20/10  7:42 AM      Component Value Range Comment   Glucose-Capillary 133 (*) 70 - 99 (mg/dL)   IRON AND TIBC     Status: Abnormal   Collection Time   09/20/10 11:41 AM      Component Value Range Comment   Iron 25 (*) 42 - 135 (ug/dL)    TIBC 221 (*) 250 - 470 (ug/dL)    Saturation Ratios 11 (*) 20 - 55 (%)    UIBC 196     VITAMIN B12     Status: Abnormal   Collection Time   09/20/10 11:41 AM      Component Value Range Comment   Vitamin B-12 1007 (*) 211 - 911 (pg/mL)   FOLATE     Status: Normal   Collection Time   09/20/10 11:41 AM  Component Value Range Comment   Folate 11.0     FERRITIN     Status: Abnormal   Collection Time   09/20/10 11:41 AM      Component Value Range Comment   Ferritin 1597 (*) 10 - 291 (ng/mL)   GLUCOSE, CAPILLARY     Status: Abnormal   Collection Time   09/20/10 12:11 PM      Component Value Range Comment   Glucose-Capillary 168 (*) 70 - 99 (mg/dL)   PROTIME-INR     Status: Abnormal   Collection Time   09/20/10  4:24 PM      Component  Value Range Comment   Prothrombin Time 19.6 (*) 11.6 - 15.2 (seconds)    INR 1.63 (*) 0.00 - 1.49    GLUCOSE, CAPILLARY     Status: Abnormal   Collection Time   09/20/10  5:29 PM      Component Value Range Comment   Glucose-Capillary 118 (*) 70 - 99 (mg/dL)   GLUCOSE, CAPILLARY     Status: Abnormal   Collection Time   09/20/10  9:24 PM      Component Value Range Comment   Glucose-Capillary 126 (*) 70 - 99 (mg/dL)    Comment 1 Documented in Chart      Comment 2 Notify RN     PROTIME-INR     Status: Abnormal   Collection Time   09/21/10  5:32 AM      Component Value Range Comment   Prothrombin Time 20.1 (*) 11.6 - 15.2 (seconds)    INR 1.68 (*) 0.00 - 1.49    DIFFERENTIAL     Status: Abnormal   Collection Time   09/21/10  5:32 AM      Component Value Range Comment   Neutrophils Relative 76  43 - 77 (%)    Neutro Abs 7.1  1.7 - 7.7 (K/uL)    Lymphocytes Relative 9 (*) 12 - 46 (%)    Lymphs Abs 0.8  0.7 - 4.0 (K/uL)    Monocytes Relative 10  3 - 12 (%)    Monocytes Absolute 0.9  0.1 - 1.0 (K/uL)    Eosinophils Relative 5  0 - 5 (%)    Eosinophils Absolute 0.4  0.0 - 0.7 (K/uL)    Basophils Relative 0  0 - 1 (%)    Basophils Absolute 0.0  0.0 - 0.1 (K/uL)   CBC     Status: Abnormal   Collection Time   09/21/10  5:32 AM      Component Value Range Comment   WBC 9.3  4.0 - 10.5 (K/uL)    RBC 2.65 (*) 3.87 - 5.11 (MIL/uL)    Hemoglobin 7.4 (*) 12.0 - 15.0 (g/dL)    HCT 22.0 (*) 36.0 - 46.0 (%)    MCV 83.0  78.0 - 100.0 (fL)    MCH 27.9  26.0 - 34.0 (pg)    MCHC 33.6  30.0 - 36.0 (g/dL)    RDW 15.7 (*) 11.5 - 15.5 (%)    Platelets 267  150 - 400 (K/uL)   BASIC METABOLIC PANEL     Status: Abnormal   Collection Time   09/21/10  5:32 AM      Component Value Range Comment   Sodium 137  135 - 145 (mEq/L)    Potassium 3.3 (*) 3.5 - 5.1 (mEq/L)    Chloride 103  96 - 112 (mEq/L)    CO2 23  19 - 32 (mEq/L)  Glucose, Bld 127 (*) 70 - 99 (mg/dL)    BUN 23  6 - 23 (mg/dL)    Creatinine, Ser  1.99 (*) 0.50 - 1.10 (mg/dL) **Please note change in reference range.**   Calcium 8.2 (*) 8.4 - 10.5 (mg/dL)    GFR calc non Af Amer 26 (*) >60 (mL/min)    GFR calc Af Amer 31 (*) >60 (mL/min)   DIGOXIN LEVEL     Status: Abnormal   Collection Time   09/21/10  5:32 AM      Component Value Range Comment   Digoxin Level 0.5 (*) 0.8 - 2.0 (ng/mL)   GLUCOSE, CAPILLARY     Status: Abnormal   Collection Time   09/21/10  8:17 AM      Component Value Range Comment   Glucose-Capillary 100 (*) 70 - 99 (mg/dL)    _0 @  Review of Systems  Constitutional: Positive for fever and malaise/fatigue.  Respiratory: Positive for shortness of breath. Negative for cough, hemoptysis, sputum production and wheezing.   Gastrointestinal: Positive for abdominal pain and diarrhea.    There were no vitals taken for this visit. Physical Exam   Gen: Pleasant, obese, in no distress,  Slightly more lethargic and confused than her baseline  ENT: No lesions,  mouth clear,  oropharynx clear, no postnasal drip  Neck: No JVD, no TMG, no carotid bruits  Lungs: No use of accessory muscles, no dullness to percussion, clear without rales or rhonchi  Cardiovascular: RRR, heart sounds normal, no murmur or gallops, no peripheral edema  Musculoskeletal: Knee remains swollen, no warmth or discharge  Neuro: alert, non focal  Skin: Warm, no lesions or rashes  ECHOCARDIOGRAM 09/11/10:   Study Conclusions    - Left ventricle: The cavity size was normal. Wall thickness was     increased in a pattern of mild LVH. Systolic function was normal.     The estimated ejection fraction was in the range of 55% to 60%.     Wall motion was normal; there were no regional wall motion     abnormalities. Features are consistent with a pseudonormal left     ventricular filling pattern, with concomitant abnormal relaxation     and increased filling pressure (grade 2 diastolic dysfunction).   - Mitral valve: Mild regurgitation.   -  Left atrium: The atrium was mildly dilated.   - Right ventricle: The cavity size was mildly dilated.   - Right atrium: The atrium was mildly dilated.   - Pulmonary arteries: Systolic pressure was moderately increased. PA     peak pressure: 51m Hg (S).  Assessment/Plan 1. PAH secondary to scleroderma. Baseline rx has been with bosentan + Tyvaso (9 puffs 4x a day) + coumadin. She stopped the Tyvaso in mid-June as mentioned above. At this time I would not restart it, despite the increase in her PAP's (most recent RVSP 42 -> 65 mmHg). I believe we will need to defer restarting the Tyvaso until as an outpt - will need to to start at lower dose and work our way up to her typical dose. I would continue her coumadin and bosentan for now. Recheck LFT's   2. Scleroderma  3. Renal insufficiency  4. Diabetes  5. Hx DVT  6. DJD, recent Knee hardware and foot hardware infxn   Marylan Glore S. 09/21/2010, 11:05 AM

## 2010-09-22 LAB — BASIC METABOLIC PANEL
CO2: 23 mEq/L (ref 19–32)
Calcium: 8.4 mg/dL (ref 8.4–10.5)
Chloride: 105 mEq/L (ref 96–112)
Sodium: 138 mEq/L (ref 135–145)

## 2010-09-22 LAB — CBC
HCT: 22.5 % — ABNORMAL LOW (ref 36.0–46.0)
Hemoglobin: 7.6 g/dL — ABNORMAL LOW (ref 12.0–15.0)
RBC: 2.73 MIL/uL — ABNORMAL LOW (ref 3.87–5.11)
WBC: 9 10*3/uL (ref 4.0–10.5)

## 2010-09-22 LAB — CULTURE, ROUTINE-ABSCESS

## 2010-09-22 LAB — GLUCOSE, CAPILLARY
Glucose-Capillary: 55 mg/dL — ABNORMAL LOW (ref 70–99)
Glucose-Capillary: 74 mg/dL (ref 70–99)
Glucose-Capillary: 102 mg/dL — ABNORMAL HIGH (ref 70–99)
Glucose-Capillary: 49 mg/dL — ABNORMAL LOW (ref 70–99)

## 2010-09-22 LAB — PROTIME-INR: INR: 2.47 — ABNORMAL HIGH (ref 0.00–1.49)

## 2010-09-23 LAB — RENAL FUNCTION PANEL
BUN: 16 mg/dL (ref 6–23)
CO2: 23 mEq/L (ref 19–32)
CO2: 23 mEq/L (ref 19–32)
Calcium: 7.9 mg/dL — ABNORMAL LOW (ref 8.4–10.5)
Calcium: 7.9 mg/dL — ABNORMAL LOW (ref 8.4–10.5)
Creatinine, Ser: 1.81 mg/dL — ABNORMAL HIGH (ref 0.50–1.10)
Glucose, Bld: 48 mg/dL — ABNORMAL LOW (ref 70–99)
Glucose, Bld: 84 mg/dL (ref 70–99)
Phosphorus: 3.2 mg/dL (ref 2.3–4.6)
Potassium: 3.3 mEq/L — ABNORMAL LOW (ref 3.5–5.1)

## 2010-09-23 LAB — CBC
HCT: 21.2 % — ABNORMAL LOW (ref 36.0–46.0)
Hemoglobin: 7.4 g/dL — ABNORMAL LOW (ref 12.0–15.0)
MCH: 28.6 pg (ref 26.0–34.0)
MCHC: 34.9 g/dL (ref 30.0–36.0)
MCV: 81.9 fL (ref 78.0–100.0)

## 2010-09-23 LAB — DIFFERENTIAL
Basophils Relative: 0 % (ref 0–1)
Lymphocytes Relative: 10 % — ABNORMAL LOW (ref 12–46)
Monocytes Absolute: 1 10*3/uL (ref 0.1–1.0)
Monocytes Relative: 11 % (ref 3–12)
Neutro Abs: 6.5 10*3/uL (ref 1.7–7.7)

## 2010-09-23 LAB — GLUCOSE, CAPILLARY
Glucose-Capillary: 101 mg/dL — ABNORMAL HIGH (ref 70–99)
Glucose-Capillary: 44 mg/dL — CL (ref 70–99)
Glucose-Capillary: 45 mg/dL — ABNORMAL LOW (ref 70–99)
Glucose-Capillary: 55 mg/dL — ABNORMAL LOW (ref 70–99)
Glucose-Capillary: 86 mg/dL (ref 70–99)
Glucose-Capillary: 98 mg/dL (ref 70–99)

## 2010-09-23 NOTE — Discharge Summary (Signed)
  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  SURGICAL PROCEDURES: 1. Excision, total knee arthroplasty, and placement of anatomic     antibiotic spacer. 2. Intensive Care Unit admission and medical consult as well as     infectious disease consult secondary to her sepsis.  Discharged to home in stable condition.  DISCHARGE MEDICATIONS:  As per her admission medications which include Vicodin for pain as well as Coumadin.  The patient has these prescriptions at home.  The patient will be started on a 3-week course of IV Kefzol at discharge, dose per Infectious disease.  FOLLOWUP:  With Dr. Sharol Given in 2 weeks, [facility] for home health physical therapy, home health RN, and durable medical equipment for walker and bedside commode.  Discharged to home in stable condition.     Newt Minion, MD     MVD/MEDQ  D:  09/15/2010  T:  09/15/2010  Job:  [id number]  Electronically Signed by Meridee Score MD on 09/23/2010 09:38:54 AM

## 2010-09-23 NOTE — Op Note (Signed)
NAMEAYO, GUARINO NO.:  [id number]  MEDICAL RECORD NO.:  [id number]  LOCATION:  5010                         FACILITY:  [facility]  PHYSICIAN:  Newt Minion, MD     DATE OF BIRTH:  1950-05-23  DATE OF PROCEDURE:  09/09/2010 DATE OF DISCHARGE:                              OPERATIVE REPORT   PREOPERATIVE DIAGNOSES: 1. Infected left total knee arthroplasty. 2. Infected left foot external fixator for fifth metatarsal fracture.  POSTOPERATIVE DIAGNOSES: 1. Infected left total knee arthroplasty. 2. Infected left foot external fixator for fifth metatarsal fracture.  PROCEDURE: 1. Removal of external fixator left foot. 2. Irrigation, debridement and removal of total knee arthroplasty left     knee, including both the tibial and femoral components. 3. Placement of an anatomic and antibiotic total knee arthroplasty     with Palico cement with 2 g of vancomycin added to the gentamicin     impregnated Palico cement.  SURGEON:  Newt Minion, MD  ANESTHESIA:  General.  ESTIMATED BLOOD LOSS:  250 mL.  ANTIBIOTICS:  Vancomycin and Zosyn preoperatively.  DRAINS:  None.  COMPLICATIONS:  None.  TOURNIQUET TIME:  None.  DISPOSITION:  To PACU in stable condition.  INDICATIONS FOR PROCEDURE:  The patient is a 60 year old woman with diabetic insensate neuropathy.  She is status post most recently external fixator placed for a fifth metatarsal fracture of the left foot by Podiatry.  The patient presented to my office on Monday with systemic symptoms.  Aspiration of her knee showed there to be purulence within the total knee and this was sent for cell count and differential and the patient was admitted and started on vancomycin and gentamicin.  After the patient has stabilized, she now presents at this time for removal of the total knee components.  Risks and benefits were discussed including persistent infection, neurovascular injury, DVT, pulmonary embolus,  need for additional surgery.  The patient states she understands and wished to proceed at this time.  DESCRIPTION OF PROCEDURE:  The patient was brought to OR room 5 and underwent a general anesthetic.  After adequate level of anesthesia was obtained, the external fixator was removed from the left foot.  The left foot was then prepped out of the sterile field and the left lower extremity was prepped using DuraPrep and draped in a sterile field with Ioban used to cover all exposed skin and the foot was draped out of the field.  The patient did have drainage from the external fixator and this may have been the source of her infection.  The previous midline incision was made.  This was carried down to the medial parapatellar retinacular incision.  The wound was irrigated with pulsatile lavage. Using an oscillating saw, the total knee femoral and tibial components were removed.  Necrotic, nonviable, infected tissue and a synovectomy was performed.  The canals were reamed to open up the femoral and tibial canals.  The knee was then irrigated with 6 liters of pulsatile lavage. Anatomic antibiotic total knee arthroplasty components were then made on the back table.  These were placed and cemented in place, both had vancomycin and gentamicin in both  of the components.  The knee was left in extension until this had hardened.  The retinaculum was closed using #0 PDS, the subcu was closed using 0 Vicryl and the skin was closed using approximating staples.  The wound was covered with Adaptic orthopedic sponges, ABD dressing, Webril, and Coban.  The patient was extubated and taken to the PACU in stable condition.  Plan for 6 weeks of IV antibiotics.  Cultures are pending.     Newt Minion, MD     MVD/MEDQ  D:  09/09/2010  T:  09/10/2010  Job:  [id number]  Electronically Signed by Meridee Score MD on 09/23/2010 09:38:57 AM

## 2010-09-24 ENCOUNTER — Telehealth: Payer: Self-pay | Admitting: Emergency Medicine

## 2010-09-24 ENCOUNTER — Inpatient Hospital Stay (HOSPITAL_COMMUNITY): Payer: Medicare Other

## 2010-09-24 DIAGNOSIS — R509 Fever, unspecified: Secondary | ICD-10-CM

## 2010-09-24 LAB — GLUCOSE, CAPILLARY
Glucose-Capillary: 92 mg/dL (ref 70–99)
Glucose-Capillary: 144 mg/dL — ABNORMAL HIGH (ref 70–99)

## 2010-09-24 LAB — PROTIME-INR
INR: 2.29 — ABNORMAL HIGH (ref 0.00–1.49)
Prothrombin Time: 25.6 seconds — ABNORMAL HIGH (ref 11.6–15.2)

## 2010-09-24 LAB — C4 COMPLEMENT: Complement C4, Body Fluid: 48 mg/dL — ABNORMAL HIGH (ref 10–40)

## 2010-09-24 LAB — BASIC METABOLIC PANEL
CO2: 24 mEq/L (ref 19–32)
GFR calc non Af Amer: 28 mL/min — ABNORMAL LOW (ref >60)
Glucose, Bld: 89 mg/dL (ref 70–99)
Potassium: 3.5 mEq/L (ref 3.5–5.1)
Sodium: 136 mEq/L (ref 135–145)

## 2010-09-24 LAB — ANTI-NUCLEAR AB-TITER (ANA TITER)

## 2010-09-24 LAB — CBC
Hemoglobin: 8.9 g/dL — ABNORMAL LOW (ref 12.0–15.0)
MCH: 27.6 pg (ref 26.0–34.0)
MCHC: 33.7 g/dL (ref 30.0–36.0)
Platelets: 215 10*3/uL (ref 150–400)
RDW: 15.5 % (ref 11.5–15.5)

## 2010-09-24 LAB — DIFFERENTIAL
Basophils Absolute: 0 10*3/uL (ref 0.0–0.1)
Basophils Relative: 0 % (ref 0–1)
Eosinophils Absolute: 0.4 10*3/uL (ref 0.0–0.7)
Monocytes Absolute: 1 10*3/uL (ref 0.1–1.0)
Neutro Abs: 6.6 10*3/uL (ref 1.7–7.7)

## 2010-09-24 LAB — C3 COMPLEMENT: C3 Complement: 176 mg/dL (ref 90–180)

## 2010-09-24 LAB — ANTI-DNA ANTIBODY, DOUBLE-STRANDED: ds DNA Ab: 1 IU/mL (ref <30)

## 2010-09-24 LAB — SJOGRENS SYNDROME-A EXTRACTABLE NUCLEAR ANTIBODY: SSA (Ro) (ENA) Antibody, IgG: 1 AU/mL (ref <30)

## 2010-09-24 NOTE — Telephone Encounter (Signed)
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  Sharee Pimple verbalized understanding.  Nothing further needed.

## 2010-09-25 ENCOUNTER — Inpatient Hospital Stay (HOSPITAL_COMMUNITY): Payer: Medicare Other

## 2010-09-25 ENCOUNTER — Inpatient Hospital Stay: Payer: Medicare Other | Admitting: Emergency Medicine

## 2010-09-25 LAB — LACTIC ACID, PLASMA: Lactic Acid, Venous: 1.1 mmol/L (ref 0.5–2.2)

## 2010-09-25 LAB — PROTIME-INR: INR: 2.37 — ABNORMAL HIGH (ref 0.00–1.49)

## 2010-09-25 LAB — BASIC METABOLIC PANEL
CO2: 22 mEq/L (ref 19–32)
Chloride: 102 mEq/L (ref 96–112)
Glucose, Bld: 203 mg/dL — ABNORMAL HIGH (ref 70–99)
Potassium: 3.8 mEq/L (ref 3.5–5.1)
Sodium: 138 mEq/L (ref 135–145)

## 2010-09-25 LAB — CBC
HCT: 26 % — ABNORMAL LOW (ref 36.0–46.0)
Hemoglobin: 8.9 g/dL — ABNORMAL LOW (ref 12.0–15.0)
MCV: 81.8 fL (ref 78.0–100.0)
RBC: 3.18 MIL/uL — ABNORMAL LOW (ref 3.87–5.11)
WBC: 11.9 10*3/uL — ABNORMAL HIGH (ref 4.0–10.5)

## 2010-09-25 LAB — HEPATIC FUNCTION PANEL
Bilirubin, Direct: 0.4 mg/dL — ABNORMAL HIGH (ref 0.0–0.3)
Total Bilirubin: 0.8 mg/dL (ref 0.3–1.2)

## 2010-09-25 LAB — CULTURE, BLOOD (ROUTINE X 2)
Culture  Setup Time: 201207070413
Culture: NO GROWTH

## 2010-09-25 LAB — GLUCOSE, CAPILLARY: Glucose-Capillary: 188 mg/dL — ABNORMAL HIGH (ref 70–99)

## 2010-09-25 LAB — CROSSMATCH
Antibody Screen: NEGATIVE
Unit division: 0

## 2010-09-25 LAB — URINALYSIS, ROUTINE W REFLEX MICROSCOPIC
Glucose, UA: NEGATIVE mg/dL
Nitrite: NEGATIVE
Protein, ur: 30 mg/dL — AB
Urobilinogen, UA: 0.2 mg/dL (ref 0.0–1.0)

## 2010-09-25 LAB — URINE MICROSCOPIC-ADD ON

## 2010-09-25 LAB — LIPASE, BLOOD: Lipase: 63 U/L — ABNORMAL HIGH (ref 11–59)

## 2010-09-25 LAB — PROCALCITONIN: Procalcitonin: 0.29 ng/mL

## 2010-09-26 LAB — URINE CULTURE
Colony Count: 2000
Culture  Setup Time: 201207131005

## 2010-09-26 LAB — CBC
MCHC: 34 g/dL (ref 30.0–36.0)
Platelets: 203 10*3/uL (ref 150–400)
RDW: 15.8 % — ABNORMAL HIGH (ref 11.5–15.5)
WBC: 8 10*3/uL (ref 4.0–10.5)

## 2010-09-26 LAB — URINALYSIS, MICROSCOPIC ONLY
Glucose, UA: NEGATIVE mg/dL
Specific Gravity, Urine: 1.016 (ref 1.005–1.030)
Urobilinogen, UA: 0.2 mg/dL (ref 0.0–1.0)

## 2010-09-26 LAB — DIFFERENTIAL
Basophils Absolute: 0 10*3/uL (ref 0.0–0.1)
Basophils Relative: 0 % (ref 0–1)
Eosinophils Absolute: 0.6 10*3/uL (ref 0.0–0.7)
Eosinophils Relative: 8 % — ABNORMAL HIGH (ref 0–5)

## 2010-09-26 LAB — GLUCOSE, CAPILLARY
Glucose-Capillary: 155 mg/dL — ABNORMAL HIGH (ref 70–99)
Glucose-Capillary: 150 mg/dL — ABNORMAL HIGH (ref 70–99)

## 2010-09-27 LAB — CBC
MCH: 27.9 pg (ref 26.0–34.0)
MCV: 82.9 fL (ref 78.0–100.0)
Platelets: 217 10*3/uL (ref 150–400)
RBC: 2.98 MIL/uL — ABNORMAL LOW (ref 3.87–5.11)
RDW: 16.2 % — ABNORMAL HIGH (ref 11.5–15.5)
WBC: 7.3 10*3/uL (ref 4.0–10.5)

## 2010-09-27 LAB — BASIC METABOLIC PANEL
CO2: 22 mEq/L (ref 19–32)
Calcium: 7.4 mg/dL — ABNORMAL LOW (ref 8.4–10.5)
Creatinine, Ser: 2.4 mg/dL — ABNORMAL HIGH (ref 0.50–1.10)
GFR calc Af Amer: 25 mL/min — ABNORMAL LOW (ref >60)
Sodium: 137 mEq/L (ref 135–145)

## 2010-09-27 LAB — GLUCOSE, CAPILLARY
Glucose-Capillary: 142 mg/dL — ABNORMAL HIGH (ref 70–99)
Glucose-Capillary: 147 mg/dL — ABNORMAL HIGH (ref 70–99)
Glucose-Capillary: 137 mg/dL — ABNORMAL HIGH (ref 70–99)
Glucose-Capillary: 113 mg/dL — ABNORMAL HIGH (ref 70–99)

## 2010-09-27 LAB — DIFFERENTIAL
Basophils Relative: 0 % (ref 0–1)
Eosinophils Absolute: 0.7 10*3/uL (ref 0.0–0.7)
Eosinophils Relative: 9 % — ABNORMAL HIGH (ref 0–5)
Monocytes Relative: 11 % (ref 3–12)
Neutrophils Relative %: 66 % (ref 43–77)

## 2010-09-27 LAB — PROTIME-INR: INR: 2.65 — ABNORMAL HIGH (ref 0.00–1.49)

## 2010-09-27 NOTE — Progress Notes (Signed)
[name], [name] NO.:  [id number]  MEDICAL RECORD NO.:  [id number]  LOCATION:  7829                         FACILITY:  [facility]  PHYSICIAN:  Oren Binet, MD    DATE OF BIRTH:  March 13, 1951                                PROGRESS NOTE   PRIMARY CARE PRACTITIONER: [facility].  PRIMARY PULMONOLOGIST: Collene Gobble, MD  PRIMARY CARDIOLOGIST: Fay Records, MD, [facility]  PRIMARY ORTHOPEDIC SURGEON: Newt Minion, MD  MEDICAL ISSUES: The patient's ongoing medical issues include the following; 1. Fever with foci sepsis being unclear at this point in time.  All     cultures to date are negative. 2. Leukocytosis, resolved with antibiotics. 3. Anemia, likely acute on chronic.  The acute component being     probably secondary to critical illness and hospitalization.  There     is no evidence of any blood loss. 4. Slowly resolving acute renal failure with creatinine plateauing at     1.99. 5. Recent severe sepsis secondary to infected left total knee     arthroplasty.  The patient is status post excision of the total     knee arthroplasty with placement of an anatomic antibiotic spacer.     The patient was discharged on July 3 , 2012, from the orthopedic     service on IV and for 3 weeks. 6. Deconditioning secondary to prolonged hospitalization, critical     illness. 7. History of scleroderma, maintained on chronic prednisone therapy. 8. 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  As an     outpatient, the patient was maintained on bosentan, Tyvaso, and     Coumadin.  Currently, the patient is on bosentan and Coumadin. 9. History of diabetes. 10.History of dyslipidemia. 11.History of hypertension, maintained on diuretics as an outpatient. 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 13.Gastroesophageal reflux disease.  CONSULTANTS: 1. Michel Bickers, MD, Infectious Disease. 2. Newt Minion, MD, from Orthopedics. 3. Collene Gobble, MD, Pulmonary Critical Care Medicine.  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  The patient did not have any cough and did not have any shortness of breath that her baseline.  It was noted that the patient had questionable diarrhea.  She was then admitted to the hospitalist service for evaluation and treatment.  For further details, please see the history and physical that was dictated by Dr. Broadus John on admission.  PERTINENT LABORATORY DATA: 1. Culture of the left knee synovial fluid that was done on September 19, 2010, and no growth so far. 2. Urine  culture done on September 18, 2010, negative as well. 3. Blood cultures done on September 18, 2010, negative as well. 4. Anemia panel showed an iron level of 25, total iron-binding     capacity of 200, 21% saturation of 11, folate level of 11.0, and a     ferritin level of 1,597. 5. Chemistries today shows a sodium of 138, potassium of 3.6, chloride     of 105, bicarb of 23, glucose of 63, BUN of 21, creatinine of 1.98,     and calcium of 8.4. 6. INR today is 2.47. 7. CBC today shows a WBC of 9.0, hemoglobin of 9.6, and platelet count     of 252.  PERTINENT RADIOLOGICAL STUDIES: 1. X-ray of the chest done on September 18, 2010, showed stable cardiomegaly     and pulmonary vascular congestion. 2. X-ray of  the left knee 4-views showed suprapatellar and     infrapatellar soft tissue swelling with soft tissue gas noted.     Knee joint effusion cannot be excluded. 3. X-ray of the abdomen 2-views shows no evidence of bowel     obstruction.  PHYSICAL EXAMINATION: VITAL SIGNS:  Shows a current temperature of 98.7 with a T-max of 100.5 degrees Fahrenheit over the past 24 hours.  Blood pressure 121/70 and her O2 saturation of 98% on room air. GENERAL:  Lying in bed, does not appear to be in any distress.  Does not have atoxic appearance. HEENT:  Atraumatic and normocephalic.  Pupils are equally react to light and accommodation. NECK:  Supple. CHEST:  Bilaterally clear to auscultation. CARDIOVASCULAR:  Heart sounds are regular. ABDOMEN:  Soft.  The patient is obese, but it is nontender and nondistended. EXTREMITIES:  The patient has chronic 2+ pitting edema in the bilateral lower extremities.  Her left knee has a linear surgical incision with no erythema or tenderness.  There is no obvious discharge. NEUROLOGIC:  The patient is awake and alert.  Speech is clear and she does not have any focal neurological deficits.  BRIEF HOSPITAL COURSE: 1. Recurrent fevers.  At this time, this is of unknown etiology.     Obviously given her underlying chronic medical comorbidities,     infection is a definite concern; however, the foci of infection     remains yet to be determined.  So far, her urine culture is     negative.  Her blood cultures and synovial fluid cultures are     negative to date.  On admission, the patient was continued on Ancef     and started on Rocephin for presumed urinary tract infection.  Upon     the urine cultures coming back negative, the Rocephin was     subsequently discontinued.  Since the patient had fever while on     Ancef, this was discontinued in favor of IV vancomycin.  Old     records were reviewed and it was reviewed that a outpatient left     knee synovial fluid  culture on just prior to her last admission was     growing  methicillin-sensitive coagulase negative staph.  During     the first few days of admission, the patient did continue to have     daily fevers; however, for the past 24 hours at least, her fevers     have kind of abated with a T-max of 100.5.  On admission, she also     did have significant leukocytosis with a WBC count of 16.9,  over     the past few days with IV vancomycin, this has slowly decreased and     has subsequently normalized.  Infectious Disease continues to     follow this patient as well.  At this time since it is felt that     her fever may be secondary to infectious issues, and it is also felt     that the patient has made somewhat of slow recovery, it is felt     that we will continue with these measures.  However, if her fever     were to recur or if she were to perhaps deteriorate, then maybe her     autoimmune issues need to be considered as a cause of the fever.     She at that point may also need her steroid dose to be increased.     Per Infectious Disease note last admission, her fever seemed to     subside after she was bolused with steroids.  However at this time,     since the patient seems to have somewhat improved, we have still     not yet elected to do that.  Going forward, obviously her blood and     synovial fluid cultures will need to be followed until they are     definitely negative. 2. Anemia.  During the hospital course, hemoglobin has slowly     decreased to a lowest level of 7.4 that was yesterday.  Today,     hemoglobin is 7.6.  Her admitting hemoglobin was 9.3.  It is felt     that this is secondary to acute illness rather than any blood loss.     Although, the patient is on chronic Coumadin therapy and she has no     overt evidence of melanoma or hematemesis.  Current plans are to     continue following her hemoglobin trend and only to transfuse if     her hemoglobin is less than 7.   Obviously if she were to have any     overt evidence of bleeding, she will need to be transfused much     before that as well. 3. Acute resolving renal failure.  This patient has had no prior     history of chronic kidney disease.  In fact, her prior chemistries     show a normal renal function.  During her last admission under the     Orthopedic Service, the patient did have deterioration of her     kidney function secondary to severe sepsis.  Review of her medical     records in [place] show that the creatinine at one point was high     as 4 on September 10, 2010.  The patient on discharge was resumed on all     of her diuretics.  Her creatinine over the past few days has been     hovering around 1.99 to 1.98 range.  She currently looks euvolemic     and does not have any obvious dehydration or volume overload     issues.  We have resumed her Lasix.  However, her metolazone     continued to be placed on hold.  I at this time do not note this is     the new baseline for this patient or if creatinine is still going     to downtrend and renal function is still going to improve.  She     will need  periodic monitoring of chemistries going forward. 4. Diabetes.  The patient as an outpatient was taking metformin as     well as Amaryl.  Obviously given the patient's ongoing kidney     issues, her metformin has been discontinued.  She is being     maintained on Amaryl while in this hospital.  However, review of     capillary blood glucose over the past 24 hours indicates at least 2     hypoglycemic episodes.  As a result, I will from today discontinue     her glimepiride as well and just maintain her on a sliding scale     regimen of insulin.  This will also be need to be closely monitored     and followed on a daily basis.  If she continues to have     hypoglycemic episodes perhaps her sliding scale insulin, should be     discontinued altogether. 5. Scleroderma.  As noted above, the patient is on  chronic steroids     and Coumadin for this.  Since she has been hemodynamically stable     and the trend is towards overall improvement in the patient's     condition.  She has not yet been bolused with stress dose steroids.     If her fever were not were not to resolve or if she were to     continue have intermittent fever, she may need increased steroid     dosage in the near future.  However for now, we will continue on IV     vanco and follow her clinical course. 6. Severe pulmonary hypertension.  This is secondary to scleroderma.     Currently, she is maintained on bosentan and Coumadin.  A Tyvaso is     currently on hold.  Pulmonary Critical Care is also assisting [place] in     this complicated patient as well. 7. Gastroesophageal reflux disease.  The patient is being maintained     on prednisone.  She does occasionally get vague abdominal pain;     however, her belly is very soft and nontender to palpation.  An     abdominal x-ray done on September 20, 2010, did not show any evidence of     bowel obstruction or free air. 8. History of hypertension.  This is currently well controlled only     with Lasix.  Her lisinopril that she used to take at home has been     currently discontinued secondary to renal issues.  As noted above,     her metolazone has also been discontinued as well for now. 9. History of gastroparesis, this is presumed secondary to diabetes.     The patient is maintained on Reglan.  She has not had no vomiting     issues so far. 10.Deconditioning.  This is from a recent surgery critical illness and     prolonged hospitalization.  Physical therapy services are following     and current recommendations are to continue with home health     services on discharge. 11.Disposition, presumably home with home health services.  If the     patient with clinical improvement, the patient currently is not yet     stable to be discharged home.     Oren Binet, MD     SG/MEDQ   D:  09/22/2010  T:  09/22/2010  Job:  [id number]  Electronically Signed by Oren Binet  on 09/27/2010 11:53:03 AM

## 2010-09-28 DIAGNOSIS — A419 Sepsis, unspecified organism: Secondary | ICD-10-CM | POA: Insufficient documentation

## 2010-09-28 DIAGNOSIS — I2789 Other specified pulmonary heart diseases: Secondary | ICD-10-CM

## 2010-09-28 DIAGNOSIS — M009 Pyogenic arthritis, unspecified: Secondary | ICD-10-CM

## 2010-09-28 LAB — GLUCOSE, CAPILLARY
Glucose-Capillary: 140 mg/dL — ABNORMAL HIGH (ref 70–99)
Glucose-Capillary: 142 mg/dL — ABNORMAL HIGH (ref 70–99)

## 2010-09-28 LAB — BASIC METABOLIC PANEL
Calcium: 7.2 mg/dL — ABNORMAL LOW (ref 8.4–10.5)
Creatinine, Ser: 2.09 mg/dL — ABNORMAL HIGH (ref 0.50–1.10)
GFR calc Af Amer: 29 mL/min — ABNORMAL LOW (ref >60)
GFR calc non Af Amer: 24 mL/min — ABNORMAL LOW (ref >60)

## 2010-09-28 LAB — PROTIME-INR
INR: 2.84 — ABNORMAL HIGH (ref 0.00–1.49)
Prothrombin Time: 30.3 seconds — ABNORMAL HIGH (ref 11.6–15.2)

## 2010-09-29 ENCOUNTER — Ambulatory Visit (INDEPENDENT_AMBULATORY_CARE_PROVIDER_SITE_OTHER): Payer: Self-pay | Admitting: Cardiovascular Disease

## 2010-09-29 DIAGNOSIS — R0989 Other specified symptoms and signs involving the circulatory and respiratory systems: Secondary | ICD-10-CM

## 2010-09-30 ENCOUNTER — Encounter: Payer: Medicare Other | Admitting: *Deleted

## 2010-10-01 LAB — CULTURE, BLOOD (ROUTINE X 2)
Culture  Setup Time: 201207130410
Culture  Setup Time: 201207130410
Culture: NO GROWTH

## 2010-10-01 NOTE — Cardiovascular Report (Signed)
  [name], [name] NO.:  [id number]  MEDICAL RECORD NO.:  [id number]           PATIENT TYPE:  LOCATION:                                 FACILITY:  PHYSICIAN:  [name], MD      DATE OF BIRTH:  1951/01/27  DATE OF PROCEDURE:  08/11/2010 DATE OF DISCHARGE:                           CARDIAC CATHETERIZATION   PROCEDURES: 1. Left heart catheterization. 2. Coronary angiography. 3. Left ventriculography.  INDICATIONS:  This is a 60 year old with scleroderma and pulmonary hypertension who had an abnormal Myoview suggestive of anterior ischemia.  She does have baseline exertional dyspnea, so we decided to take her for left heart catheterization to evaluate coronaries.  PROCEDURE NOTE:  After informed consent was obtained, the right groin was sterilely prepped and draped.  Lidocaine 1% was used to locally anesthetize the right groin area.  The right common femoral artery was entered using modified Seldinger technique and a 4-French arterial sheath was placed.  The left coronary artery was engaged using the JL-5 catheter.  The right coronary artery was engaged using the 3-DRC catheter and the left ventricle was entered using angled pigtail catheter with no complications.  FINDINGS: 1. Hemodynamics:  LV 139/22, aorta 125/66. 2. Left ventriculography:  LV systolic function was vigorous.  EF was     estimated at 65-70%. 3. Right coronary artery.  The right coronary artery was a dominant     vessel.  No angiographic coronary artery disease. 4. Left main:  The left main was relatively short with no angiographic     coronary artery disease. 5. Left circumflex system:  Left circumflex system had no angiographic     coronary artery disease. 6. LAD system:  The LAD system was large and wrapped around the apex.     There was no angiographic coronary artery disease.  IMPRESSION:  The patient has no significant coronary artery disease.  I suspect that her abnormal  Myoview was a false positive due to breast attenuation.  Her symptoms likely are coming from her scleroderma.    [name], MD    DM/MEDQ  D:  08/11/2010  T:  08/12/2010  Job:  [id number]  cc:   Fay Records, MD, [facility]  Electronically Signed by [name] MD on 10/01/2010 11:55:09 AM

## 2010-10-02 ENCOUNTER — Inpatient Hospital Stay (HOSPITAL_COMMUNITY)
Admission: EM | Admit: 2010-10-02 | Discharge: 2010-10-07 | DRG: 917 | Disposition: A | Discharge status: Home Health | Payer: Medicare Other | Attending: Internal Medicine | Admitting: Internal Medicine

## 2010-10-02 ENCOUNTER — Emergency Department (HOSPITAL_COMMUNITY): Payer: Medicare Other

## 2010-10-02 DIAGNOSIS — E1169 Type 2 diabetes mellitus with other specified complication: Secondary | ICD-10-CM | POA: Diagnosis present

## 2010-10-02 DIAGNOSIS — Z7901 Long term (current) use of anticoagulants: Secondary | ICD-10-CM

## 2010-10-02 DIAGNOSIS — IMO0002 Reserved for concepts with insufficient information to code with codable children: Secondary | ICD-10-CM

## 2010-10-02 DIAGNOSIS — I5033 Acute on chronic diastolic (congestive) heart failure: Secondary | ICD-10-CM | POA: Diagnosis present

## 2010-10-02 DIAGNOSIS — R5381 Other malaise: Secondary | ICD-10-CM | POA: Diagnosis present

## 2010-10-02 DIAGNOSIS — Z79899 Other long term (current) drug therapy: Secondary | ICD-10-CM

## 2010-10-02 DIAGNOSIS — N189 Chronic kidney disease, unspecified: Secondary | ICD-10-CM | POA: Diagnosis present

## 2010-10-02 DIAGNOSIS — I509 Heart failure, unspecified: Secondary | ICD-10-CM | POA: Diagnosis present

## 2010-10-02 DIAGNOSIS — I2789 Other specified pulmonary heart diseases: Secondary | ICD-10-CM | POA: Diagnosis present

## 2010-10-02 DIAGNOSIS — M009 Pyogenic arthritis, unspecified: Secondary | ICD-10-CM | POA: Diagnosis present

## 2010-10-02 DIAGNOSIS — T383X1A Poisoning by insulin and oral hypoglycemic [antidiabetic] drugs, accidental (unintentional), initial encounter: Principal | ICD-10-CM | POA: Diagnosis present

## 2010-10-02 DIAGNOSIS — T38801A Poisoning by unspecified hormones and synthetic substitutes, accidental (unintentional), initial encounter: Secondary | ICD-10-CM | POA: Diagnosis present

## 2010-10-02 DIAGNOSIS — M349 Systemic sclerosis, unspecified: Secondary | ICD-10-CM | POA: Diagnosis present

## 2010-10-02 DIAGNOSIS — I129 Hypertensive chronic kidney disease with stage 1 through stage 4 chronic kidney disease, or unspecified chronic kidney disease: Secondary | ICD-10-CM | POA: Diagnosis present

## 2010-10-02 DIAGNOSIS — D638 Anemia in other chronic diseases classified elsewhere: Secondary | ICD-10-CM | POA: Diagnosis present

## 2010-10-02 LAB — DIFFERENTIAL
Eosinophils Relative: 8 % — ABNORMAL HIGH (ref 0–5)
Lymphocytes Relative: 10 % — ABNORMAL LOW (ref 12–46)
Lymphs Abs: 1.1 10*3/uL (ref 0.7–4.0)
Monocytes Absolute: 1.1 10*3/uL — ABNORMAL HIGH (ref 0.1–1.0)
Monocytes Relative: 10 % (ref 3–12)

## 2010-10-02 LAB — GLUCOSE, CAPILLARY
Glucose-Capillary: 66 mg/dL — ABNORMAL LOW (ref 70–99)
Glucose-Capillary: 37 mg/dL — CL (ref 70–99)
Glucose-Capillary: 43 mg/dL — CL (ref 70–99)
Glucose-Capillary: 139 mg/dL — ABNORMAL HIGH (ref 70–99)
Glucose-Capillary: 48 mg/dL — ABNORMAL LOW (ref 70–99)
Glucose-Capillary: 55 mg/dL — ABNORMAL LOW (ref 70–99)

## 2010-10-02 LAB — URINALYSIS, ROUTINE W REFLEX MICROSCOPIC
Glucose, UA: NEGATIVE mg/dL
Ketones, ur: NEGATIVE mg/dL
Protein, ur: 100 mg/dL — AB

## 2010-10-02 LAB — CBC
HCT: 25.5 % — ABNORMAL LOW (ref 36.0–46.0)
MCV: 82.5 fL (ref 78.0–100.0)
RDW: 16.5 % — ABNORMAL HIGH (ref 11.5–15.5)
WBC: 10.9 10*3/uL — ABNORMAL HIGH (ref 4.0–10.5)

## 2010-10-02 LAB — URINE MICROSCOPIC-ADD ON

## 2010-10-02 LAB — COMPREHENSIVE METABOLIC PANEL
ALT: 7 U/L (ref 0–35)
Alkaline Phosphatase: 127 U/L — ABNORMAL HIGH (ref 39–117)
CO2: 18 mEq/L — ABNORMAL LOW (ref 19–32)
Calcium: 6.5 mg/dL — ABNORMAL LOW (ref 8.4–10.5)
GFR calc Af Amer: 29 mL/min — ABNORMAL LOW (ref >60)
GFR calc non Af Amer: 24 mL/min — ABNORMAL LOW (ref >60)
Glucose, Bld: 79 mg/dL (ref 70–99)
Potassium: 3.5 mEq/L (ref 3.5–5.1)
Sodium: 137 mEq/L (ref 135–145)

## 2010-10-02 LAB — RAPID URINE DRUG SCREEN, HOSP PERFORMED: Barbiturates: NOT DETECTED

## 2010-10-02 LAB — DIGOXIN LEVEL: Digoxin Level: <0.3 ng/mL — ABNORMAL LOW (ref 0.8–2.0)

## 2010-10-03 LAB — RENAL FUNCTION PANEL
BUN: 16 mg/dL (ref 6–23)
CO2: 20 mEq/L (ref 19–32)
Calcium: 6.4 mg/dL — CL (ref 8.4–10.5)
Creatinine, Ser: 2.07 mg/dL — ABNORMAL HIGH (ref 0.50–1.10)
GFR calc Af Amer: 30 mL/min — ABNORMAL LOW (ref >60)
Glucose, Bld: 80 mg/dL (ref 70–99)
Phosphorus: 2.3 mg/dL (ref 2.3–4.6)

## 2010-10-03 LAB — GLUCOSE, CAPILLARY
Glucose-Capillary: 107 mg/dL — ABNORMAL HIGH (ref 70–99)
Glucose-Capillary: 59 mg/dL — ABNORMAL LOW (ref 70–99)
Glucose-Capillary: 68 mg/dL — ABNORMAL LOW (ref 70–99)
Glucose-Capillary: 77 mg/dL (ref 70–99)
Glucose-Capillary: 94 mg/dL (ref 70–99)
Glucose-Capillary: 112 mg/dL — ABNORMAL HIGH (ref 70–99)
Glucose-Capillary: 167 mg/dL — ABNORMAL HIGH (ref 70–99)
Glucose-Capillary: 81 mg/dL (ref 70–99)
Glucose-Capillary: 104 mg/dL — ABNORMAL HIGH (ref 70–99)
Glucose-Capillary: 120 mg/dL — ABNORMAL HIGH (ref 70–99)
Glucose-Capillary: 231 mg/dL — ABNORMAL HIGH (ref 70–99)
Glucose-Capillary: 259 mg/dL — ABNORMAL HIGH (ref 70–99)

## 2010-10-03 LAB — CBC
HCT: 25.1 % — ABNORMAL LOW (ref 36.0–46.0)
MCH: 27.5 pg (ref 26.0–34.0)
MCHC: 33.9 g/dL (ref 30.0–36.0)
MCV: 81.2 fL (ref 78.0–100.0)
Platelets: 356 10*3/uL (ref 150–400)
RDW: 16.3 % — ABNORMAL HIGH (ref 11.5–15.5)
WBC: 10.6 10*3/uL — ABNORMAL HIGH (ref 4.0–10.5)

## 2010-10-03 LAB — URINE CULTURE: Culture  Setup Time: 201207201356

## 2010-10-04 ENCOUNTER — Inpatient Hospital Stay (HOSPITAL_COMMUNITY): Payer: Medicare Other

## 2010-10-04 LAB — GLUCOSE, CAPILLARY
Glucose-Capillary: 67 mg/dL — ABNORMAL LOW (ref 70–99)
Glucose-Capillary: 68 mg/dL — ABNORMAL LOW (ref 70–99)
Glucose-Capillary: 84 mg/dL (ref 70–99)
Glucose-Capillary: 249 mg/dL — ABNORMAL HIGH (ref 70–99)
Glucose-Capillary: 94 mg/dL (ref 70–99)
Glucose-Capillary: 97 mg/dL (ref 70–99)
Glucose-Capillary: 139 mg/dL — ABNORMAL HIGH (ref 70–99)
Glucose-Capillary: 165 mg/dL — ABNORMAL HIGH (ref 70–99)

## 2010-10-04 LAB — COMPREHENSIVE METABOLIC PANEL
Albumin: 2.3 g/dL — ABNORMAL LOW (ref 3.5–5.2)
Alkaline Phosphatase: 123 U/L — ABNORMAL HIGH (ref 39–117)
BUN: 22 mg/dL (ref 6–23)
Potassium: 3.3 mEq/L — ABNORMAL LOW (ref 3.5–5.1)
Sodium: 135 mEq/L (ref 135–145)
Total Protein: 6.7 g/dL (ref 6.0–8.3)

## 2010-10-04 LAB — PROTIME-INR: Prothrombin Time: 32.4 seconds — ABNORMAL HIGH (ref 11.6–15.2)

## 2010-10-04 LAB — IRON AND TIBC
Iron: 36 ug/dL — ABNORMAL LOW (ref 42–135)
Saturation Ratios: 17 % — ABNORMAL LOW (ref 20–55)
TIBC: 218 ug/dL — ABNORMAL LOW (ref 250–470)
UIBC: 182 ug/dL

## 2010-10-04 LAB — CBC
Platelets: 359 10*3/uL (ref 150–400)
RBC: 2.97 MIL/uL — ABNORMAL LOW (ref 3.87–5.11)
WBC: 10.5 10*3/uL (ref 4.0–10.5)

## 2010-10-04 LAB — RETICULOCYTES: Retic Count, Absolute: 58.5 10*3/uL (ref 19.0–186.0)

## 2010-10-05 LAB — GLUCOSE, CAPILLARY
Glucose-Capillary: 112 mg/dL — ABNORMAL HIGH (ref 70–99)
Glucose-Capillary: 151 mg/dL — ABNORMAL HIGH (ref 70–99)
Glucose-Capillary: 135 mg/dL — ABNORMAL HIGH (ref 70–99)

## 2010-10-05 LAB — CBC
MCH: 27.1 pg (ref 26.0–34.0)
MCHC: 33.6 g/dL (ref 30.0–36.0)
MCV: 80.7 fL (ref 78.0–100.0)
Platelets: 353 10*3/uL (ref 150–400)
RBC: 3.06 MIL/uL — ABNORMAL LOW (ref 3.87–5.11)
RDW: 16.1 % — ABNORMAL HIGH (ref 11.5–15.5)

## 2010-10-05 LAB — BASIC METABOLIC PANEL
CO2: 22 mEq/L (ref 19–32)
Calcium: 6.7 mg/dL — ABNORMAL LOW (ref 8.4–10.5)
Creatinine, Ser: 1.86 mg/dL — ABNORMAL HIGH (ref 0.50–1.10)
GFR calc non Af Amer: 28 mL/min — ABNORMAL LOW (ref >60)

## 2010-10-06 DIAGNOSIS — I5189 Other ill-defined heart diseases: Secondary | ICD-10-CM | POA: Insufficient documentation

## 2010-10-06 DIAGNOSIS — A419 Sepsis, unspecified organism: Secondary | ICD-10-CM

## 2010-10-06 DIAGNOSIS — M349 Systemic sclerosis, unspecified: Secondary | ICD-10-CM | POA: Insufficient documentation

## 2010-10-06 LAB — BASIC METABOLIC PANEL
CO2: 24 mEq/L (ref 19–32)
Calcium: 6.5 mg/dL — ABNORMAL LOW (ref 8.4–10.5)
Creatinine, Ser: 1.85 mg/dL — ABNORMAL HIGH (ref 0.50–1.10)
Glucose, Bld: 115 mg/dL — ABNORMAL HIGH (ref 70–99)

## 2010-10-06 LAB — CBC
Hemoglobin: 8.3 g/dL — ABNORMAL LOW (ref 12.0–15.0)
MCH: 27 pg (ref 26.0–34.0)
MCV: 80.8 fL (ref 78.0–100.0)
Platelets: 352 10*3/uL (ref 150–400)
RBC: 3.07 MIL/uL — ABNORMAL LOW (ref 3.87–5.11)

## 2010-10-06 LAB — GLUCOSE, CAPILLARY: Glucose-Capillary: 154 mg/dL — ABNORMAL HIGH (ref 70–99)

## 2010-10-07 NOTE — Discharge Summary (Signed)
[name], [name] NO.:  [id number]  MEDICAL RECORD NO.:  [id number]  LOCATION:  5508                         FACILITY:  [facility]  PHYSICIAN:  Jackie Plum, MD  DATE OF BIRTH:  [birth year]  DATE OF ADMISSION:  10/02/2010 DATE OF DISCHARGE:  10/07/2010                        DISCHARGE SUMMARY - REFERRING   DISCHARGING DOCTOR:  Jackie Plum, MD  PRIMARY ATTENDING PHYSICIANS: 1. Eleonore Chiquito, MD 2. Jackie Plum, MD, (the patient was seen for 1 day in this     admission which is October 07, 2010).  MODIFIED DISCHARGE DIAGNOSES: 1. Hypoglycemia, most likely secondary to oral hypoglycemics or     Somogyi phenomenon. 2. Type 2 diabetes mellitus - dietary controlled for now. 3. Left total knee arthroplasty. 4. Septic arthritis, left knee.  The patient is to complete     intravenous Keflex at home.  We will also continue Coumadin.  INR     check at [facility] on October 09, 2010. 5. Chronic kidney disease. 6. Anemia of chronic disorder. 7. Scleroderma. 8. Secondary pulmonary hypertension, most likely secondary to     scleroderma. 9. Acute-on-chronic congestive heart failure (diastolic dysfunction). 10.Deconditioning.  Please for detailed hospital course on this patient, refer to the discharge summary dictated by Dr. Eleonore Chiquito on October 06, 2010.  The patient was, however, seen by me for the very first time in this admission which is today, October 07, 2010.  She denies any complaint.  PHYSICAL EXAMINATION:  GENERAL:  Examination showed the patient to be pale. VITAL SIGNS:  Blood pressure is 133/77, pulse 74, respiratory rate 20, temperature is 98.4. NECK:  No jugular venous distention. CHEST:  Clear to auscultation. CARDIAC:  Heart sounds are 1 and 2. ABDOMEN:  Soft, nontender.  Liver, spleen, kidney are not palpable. Bowel sounds are positive. EXTREMITIES:  Pedal edema +1. NEUROLOGIC:  Nonfocal. SKIN:  Very thickened.  Available most recent  labs prior to discharge include coagulation profile done on October 07, 2010, showed PT 17.6, INR 1.42.  Basic metabolic panel done on October 03, 2010, showed a sodium of 138, potassium of 4.0, chloride of 101 with a bicarb of 24, glucose is 115, BUN is 23, creatinine is 1.85, and then complete blood count with no differential done on October 06, 2010, showed WBC of 13.5, hemoglobin of 8.3, hematocrit of 24.8, MCV of 80.8 with a platelet count of 352.  Hemoglobin A1c done on October 03, 2010, was 6.6.  The patient is medically stable.  I discussed the patient's case extensively with the patient and the patient's spouse and the patient's sister and both verbalized otherwise understanding.  Plan is for the patient to be discharged home today and activity as tolerated.  Low-sodium, low-cholesterol daily weight and fluid restriction.  The patient was advised to watch out for signs and symptoms of congestive heart failure which include congestion in the lungs, shortness of breath, progressive swelling of the lower extremity, and should she develop any of these symptoms, she will return to the hospital.  Medications to be taken at home include: 1. Ferrous sulfate 325 mg one p.o. b.i.d. 2. Potassium chloride 20 mEq one p.o. daily. 3. Excedrin  Migraine 1 tablet p.o. daily p.r.n. 4. Furosemide 40 mg p.o. daily. 5. Digoxin 0.125 mg p.o. daily. 6. Lyrica (pregabalin) 150 mg p.o. t.i.d. 7. Nexium (esomeprazole) 40 mg one p.o. b.i.d. 8. Prednisone 5 mg one p.o. b.i.d. 9. Premarin (conjugated estrogen) 0.625 mg one p.o. daily. 10.Tracleer (bosentan) 125 mg p.o. b.i.d. 11.Vicodin (hydrocodone/APAP) 10/650 one p.o. q.6 h. p.r.n. 12.Warfarin 2.5 mg 1-1/2 tablet p.o. daily. 13.Kefzol 1 g IV q.8 h. to be completed on October 19, 2010.  A copy of this addendum to the discharge summary will be made available to the patient's primary care doctor that is [facility].     Jackie Plum, MD     CN/MEDQ   D:  10/07/2010  T:  10/07/2010  Job:  [id number]  cc:   Karoline Caldwell  Electronically Signed by Jackie Plum  on 10/07/2010 07:18:23 PM

## 2010-10-08 NOTE — Discharge Summary (Signed)
NAMEADLENE, [name] NO.:  [id number]  MEDICAL RECORD NO.:  [id number]  LOCATION:  5527                         FACILITY:  [facility]  PHYSICIAN:  Leana Gamer, MDDATE OF BIRTH:  1950-12-01  DATE OF ADMISSION:  09/18/2010 DATE OF DISCHARGE:  09/28/2010                              DISCHARGE SUMMARY   PRIMARY CARE PHYSICIAN:  With [facility].  PULMONOLOGIST:  Collene Gobble, MD  CARDIOLOGIST:  Fay Records, MD, [facility]  ORTHOPEDIC SURGEON:  Newt Minion, MD  RHEUMATOLOGIST:  Leigh Aurora, MD  DISCHARGE DIAGNOSES: 1. Fever with foci sepsis unclear. 2. Leukocytosis. 3. Anemia acute-on-chronic. 4. Acute-on-chronic renal failure. 5. Recent severe sepsis secondary to infected left total knee     arthroplasty. 6. Recent excision of left knee arthroplasty and placement     of antibiotic spacer. 7. Deconditioning secondary to prolonged illness. 8. 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 9. History of pulmonary arterial hypertension secondary to scleroderma     maintained on Bosentan and Coumadin by Dr. Baltazar Apo. 10.Type 2 diabetes. 11.Hyperlipidemia. 12.Hypertension.  CONDITION AT THE TIME OF DISCHARGE:  [name] is much improved compared to admission.  She has been afebrile for approximately 5 days. Her knee is not swollen.  There is no erythema.  The pain has decreased. She is able to get up and walk about the room.  She is eating well, having good bowel movements and desires discharge to home with IV antibiotics.  CONSULTANTS: 1. Michel Bickers, MD from Infectious Disease. 2. Meridee Score, MD from Orthopedics. 3. Baltazar Apo, MD from Pulmonary Critical Care.  HISTORY OF PRESENT ILLNESS:  [name] is a very pleasant 60 year old female who has a longstanding history of scleroderma with steroid dependency, severe pulmonary arterial hypertension, and right sided heart failure that are sequele of her  scleroderma.  She was discharged from the Orthopedic Service on September 15, 2010, with severe sepsis from an infected left knee arthroplasty which was excised and she had placement of an anatomic antibiotic spacer, and discharged on 3 weeks of IV Ancef.  She returned to the hospital on September 18, 2010, with complaints of worsening pain and swelling in her knee as well as persistent fevers.  It was noted on admission that the patient had question of diarrhea and urinary tract symptoms.  PERTINENT LABORATORY DATA: 1. Culture of the left knee synovial fluid that was done on September 19, 2010, shows no growth final. 2. Blood cultures that were obtained September 24, 2010, are still     preliminary, however, they have no growth to date, today on September 28, 2010. 3. Urine culture obtained September 24, 2010, has 2000 colonies     insignificant growth and his finalized urine culture obtained September 25, 2010, shows no growth and his final.  4.  Anemia panel showed an iron     level of 25, total iron binding capacity of 200, 21% sat, folate of     11 and ferritin level of 1597. 5. Chemistries today show a sodium of 136, potassium 3.4, chloride  102, bicarb 20, glucose 152, BUN 13, creatinine 2.09.  PT 30.3, INR     2.84, calcium level 7.2.  PERTINENT RADIOLOGICAL EXAMINATION: 1. An x-ray of the chest done September 18, 2010, that showed stable     cardiomegaly and pulmonary vascular congestion. 2. X-ray of left knee 4-views showed a suprapatellar and infrapatellar     soft tissue swelling with soft tissue gas noted, knee joint     effusion cannot be excluded. 3. X-ray of the abdominal two-view shows no evidence of bowel     obstruction. 4. On September 25, 2010, the patient had a followup abdominal x-ray for     possible C. diff colitis.  Impression was no acute abnormalities.  PHYSICAL EXAMINATION:  GENERAL:  The patient is alert and oriented in no apparent distress.  She is sitting up in bed, smiling. VITAL  SIGNS:  Today temperature 98.2, pulse 69, respirations 18, blood pressure 121/69, O2 sats 96% on room air. HEENT:  Head is atraumatic normocephalic.  Eyes are anicteric with pupils are equal, round, reactive to light.  Her nose shows no nasal discharge or exterior lesions.  Mouth has moist mucous membranes with moderate dentition. NECK:  Supple with midline trachea.  No JVD.  No lymphadenopathy. CHEST:  Demonstrates no accessory muscle use.  She has no wheezes or crackles to my exam. HEART:  Regular rate and rhythm without obvious murmurs, rubs or gallops. ABDOMEN:  Obese, nontender, nondistended with regular bowel sounds. EXTREMITIES:  She has chronic 2+ pitting edema in the bilateral lower extremities.  Her left knee has linear surgical incision with no erythema or tenderness.  There is no obvious discharge. NEUROLOGIC: The patient is awake and alert.  Speech clear.  She is not having focal neurological deficits. PSYCHIATRIC:  The patient is alert and oriented.  Demeanor is pleasant, cooperative.  Grooming is good.  BRIEF HOSPITAL COURSE: 1. Recurrent fevers.  The patient's source of fevers remains unknown.     They were attributed to her septic knee.  Dr. Michel Bickers saw the     patient in consultation for Infectious Disease, did a thorough     workup.  The patient was treated with IV vancomycin throughout most     of her stay.  Her Ancef was discontinued at admission.  On September 25, 2010, there was concern for urinary tract infection.  Tressie Ellis was     added and consequently she has had 3 days of [place] by the time of     discharge.  Dr. Megan Salon recommended that she be discharged to home     on IV Ancef 1 g q. 12 hours through October 19, 2010. 2. Anemia.  During the course of her hospital stay, her hemoglobin     slowly decreased.  She was on Coumadin.  Also, there was concern     for GI bleeding.  However, there was no obvious blood loss.  She     did receive a blood  transfusion of 2 units on September 24, 2010, with     no complications afterwards.  She did have an anemia panel done.     Results of the anemia panel are listed above. 3. Acute resolving renal failure.  The patient has no prior history of     chronic kidney disease before June 2012.  Her prior chemistry     showed normal renal function.  During her last admission, she did     have  deterioration of her kidney function due to severe sepsis.     During this hospitalization, her kidney function was originally     elevated, it came back down and then again increased towards the     end of her hospitalization with possibly due to vancomycin, Cr. is 2.04 on day of discharge.  Her     vancomycin is being discontinued today.  We would advise that her     primary care physician follow up with a BMET in the next week to     ensure that her kidney function has not deteriorated. 4. Diabetes.  The patient was taking metformin and glimepiride as an     outpatient.  During her hospitalization, she was also maintained on     sliding scale insulin.  She did have several episodes of     hypoglycemia.  Consequently, her glimepiride was discontinued.  She     will be discharged on only metformin for her diabetes.  It is     advised that her primary care physician monitor her diabetes and     adjust her medications as needed. 5. Scleroderma.  As mentioned, the patient is on chronic steroids and     Coumadin at this time for scleroderma and is being managed by Dr.     Leigh Aurora.  She has not needed to be bolused with stress dose     steroids fortunately.  She will continue to see Dr. Amil Amen and     followup for her scleroderma. 6. Severe pulmonary arterial hypertension secondary to scleroderma.     The patient is managed by Dr. Baltazar Apo who initially had her on     Coumadin, bosentan, as well as Tyvaso.  The Tyvaso was discontinued     during her previous admission and she is currently not on it.  It      will be restarted as an outpatient by Dr. Lamonte Sakai in his office 7. GERD.  The patient is being maintained on prednisone and     occasionally gets vague abdominal pain.  She does take a PPI. 8. Hypertension.  Currently, the patient is controlled with lisinopril     and Lasix.  This will need to be followed as an outpatient.  Her     metolazone has been discontinued for now. 9. Gastroparesis.  The patient occasionally needs Reglan for nausea.     She has had no vomiting, no issues with this diagnosis so far     during this hospitalization.   10.She will have INR check, Coumadin appointment at [facility]     on September 30, 2010, at 10:45 a.m.  DISCHARGE MEDICATIONS: 1. Excedrin Migraine OTC 1 tablet by mouth daily as needed for pain. 2. Furosemide 20 mg 2 tablets daily. 3. Lanoxin 0.125 mg 1 tablet by mouth daily. 4. Lyrica 150 mg 1 tablet by mouth three times a day. 5. Metformin 500 mg 1 tablet by mouth twice daily. 6. Nexium 40 mg 1 capsule by mouth twice daily. 7. Prednisone 5 mg 1 tablet by mouth twice daily. 8. Premarin 0.625 one tablet by mouth daily. 9. Tracleer 125 mg tablet by mouth twice daily. 10.Vicodin 10/650 one tablet by mouth every 6 hours as needed for     pain. 11.Warfarin 2.5 mg 1-1/2 tablets by mouth daily. 12.Kefzol 1 g intravenously every 8 hours until October 19, 2010.   DISCHARGE INSTRUCTIONS:  The patient is being discharged to home.  She is to  increase her activity slowly.  DIET:  Low-sodium heart-healthy.  FOLLOWUP APPOINTMENTS: 1. Dr. Sharol Given please call [phone] for an appointment within 2 weeks. 2. Dr. Baltazar Apo [phone].  She will see Dr. Lamonte Sakai on October 16, 2010, at 4:15. 3. Dr. Megan Salon of Infectious Disease at [phone].  She will see him     on October 14, 2010, at 3:30 p.m. 4. She has [facility] with an INR check Wednesday, September 30, 2010, at 10:45 a.m. 5. She will have [facility] home health, telephone number  [phone].  SUGGESTIONS FOR OUTPATIENT FOLLOWUP:  The patient will need to have her diabetes and hypertension medications closely monitored as these have been changed in the hospital.  Also, she is not to be on Tyvaso, until it is started by Dr. Lamonte Sakai.     Melton Alar, PA   ______________________________ Leana Gamer, MD    MLY/MEDQ  D:  09/28/2010  T:  09/29/2010  Job:  [id number]  cc:   Collene Gobble, MD Newt Minion, MD Leigh Aurora, MD Fay Records, MD, [facility]  Electronically Signed by Imogene Burn PA on 09/29/2010 07:54:15 PM Electronically Signed by Liston Alba MD on 10/08/2010 10:36:20 AM

## 2010-10-09 ENCOUNTER — Ambulatory Visit (INDEPENDENT_AMBULATORY_CARE_PROVIDER_SITE_OTHER): Payer: Self-pay | Admitting: Cardiovascular Disease

## 2010-10-09 DIAGNOSIS — R0989 Other specified symptoms and signs involving the circulatory and respiratory systems: Secondary | ICD-10-CM

## 2010-10-09 LAB — CULTURE, BLOOD (ROUTINE X 2)
Culture  Setup Time: 201207211821
Culture: NO GROWTH
Culture: NO GROWTH

## 2010-10-09 LAB — POCT INR: INR: 1.4

## 2010-10-13 NOTE — H&P (Signed)
NAMEDOVEY, FATZINGER NO.:  [id number]  MEDICAL RECORD NO.:  [id number]  LOCATION:  [facility]                         FACILITY:  [facility]  PHYSICIAN:  Eleonore Chiquito, MD         DATE OF BIRTH:  01-Sep-1950  DATE OF ADMISSION:  10/02/2010 DATE OF DISCHARGE:                             HISTORY & PHYSICAL   PRIMARY CARE PHYSICIAN:  Unassigned.  HISTORY OF PRESENT ILLNESS:  This is a 60 year old female who was just discharged from the hospital on September 28, 2010, after she was treated for fever with sepsis and was sent home on Kefzol 1 gram IV q.8 h until October 19, 2010.  The patient also was found to have hypoglycemic episodes in the hospital.  At that time, she was on glimepiride as well as metformin.  The patient's glimepiride was discontinued at the time of discharge, but as per the medication list from the EMT, the patient is still showing up on glimepiride.  I checked with the family and they said that the patient has been getting all the medications, which are in the bottles.  The patient today was found to be unresponsive, so 911 was called.  When the EMT reached, the patient was found to have hypoglycemia with the blood sugar as low as in 20s.  So the patient was sent to the hospital for further evaluation.  There has been no history of seizures.  The patient is alert but restless.  Denies any chest pain or shortness of breath.  No nausea or vomiting.  No diarrhea.  The patient also has started eating as per the family and she ate over the past 2 days.  PAST MEDICAL HISTORY:  Significant for, 1. Chronic anemia. 2. Chronic renal failure. 3. History of severe sepsis secondary to infected left knee     arthroplasty. 4. Recent status post excision of left knee arthroplasty and placement     of antibiotic spacer. 5. Deconditioning. 6. History of scleroderma, maintained on chronic prednisone therapy. 7. History of pulmonary hypertension secondary to scleroderma,  maintained on the bosentan and Coumadin by Dr. Baltazar Apo. 8. Diabetes mellitus. 9. Hypertension.  CURRENT MEDICATIONS:  Include as per discharge summary from September 28, 2010, 1. Excedrin, migraine over-the-counter 1 tablet p.o. daily as needed. 2. Furosemide 20 mg 2 tablets p.o. daily. 3. Lanoxin 0.125 mg p.o. daily. 4. Lyrica 150 mg 1 tablet p.o. t.i.d. 5. Metformin 500 mg 1 tablet p.o. b.i.d. 6. Nexium 40 mg 1 capsule p.o. b.i.d. 7. Prednisone 5 mg 1 tablet p.o. b.i.d. 8. Premarin 0.625 mg one p.o. daily. 9. Tracleer 125 mg 1 tablet p.o. b.i.d. 10.Vicodin 10/650 one tablet p.o. every 6 hours as needed. 11.Warfarin 2.5 mg 1-1/2 tablet by mouth daily. 12.Kefzol 1 gram IV every 8 hours until October 19, 2010. 13.Reglan 1 tablet p.o. four times a day with meals. 14.Metolazone 2.5 mg 1 tablet p.o. every morning. 15.Lisinopril 10 mg p.o. daily. 16.Tyvaso 1.74 mg/2.9 mL nebulizer four times a day. 17.The patient also has been on glimepiride as per the EMT medication     list, which was confirmed with the family that the patient was still  getting glimepiride, though it was discontinued at the time     of discharge.  SOCIAL HISTORY:  The patient lives at home with her son and denies any tobacco abuse.  No history of alcohol or illicit drug abuse.  ALLERGIES:  TO CODEINE AND IVP DYE.  REVIEW OF SYSTEMS:  All negative except in HPI.  PHYSICAL EXAMINATION:  VITAL SIGNS:  The patient's blood pressure is 135/67, temperature is 97.8, pulse 90, respirations 24, O2 sat 98%. HEENT:  Head is atraumatic, normocephalic.  Eyes, extraocular muscles intact.  Oral mucosa is moist. NECK:  Supple. CHEST:  Clear to auscultation bilaterally. HEART:  S1 and S2, regular rate and rhythm. ABDOMEN:  Soft and nontender.  No organomegaly. EXTREMITIES:  No cyanosis, no clubbing, no edema.  The patient has staples in place in the left knee.  The patient is status post total knee arthroplasty. NEUROLOGICAL:   The patient is able to move all the four extremities. She is alert, but not oriented at this time.  PERTINENT IMAGING STUDIES: 1. Chest x-ray done on October 02, 2010, showed interval removal of the     right side PICC line, placement of the left upper extremity post     PICC line with left subclavian occlusive vein. 2. Unchanged findings of cardiomegaly, prominence of pulmonary     interstitial without frank edema or evidence of pulmonary edema. 3. CT of the head without contrast showed no acute abnormality.  ASSESSMENT: 1. Hypoglycemia, secondary to medications glimepiride and metformin. 2. Recent sepsis secondary to infected left total knee arthroplasty. 3. History of scleroderma. 4. History of hypertension. 5. History of diastolic dysfunctional. 6. Diabetes mellitus.  PLAN: 1. For hypoglycemia, the patient was discharged on the metformin only,     but the family was still giving the patient glimepiride, which     probably has caused the hypoglycemia as the patient had     hypoglycemic episodes while the patient was in the hospital in the     previous admission.  So at this time, I am going to hold both the     glimepiride and metformin and will put the patient on D10W and     monitor the patient's ABG q.1 h.  If the patient develops again     hypoglycemic with her blood sugar less than 70, we will give her     D50 ampule.  At this time, we will just have to wait for the     medications to be get out of the system before the hypoglycemia     resolves.  If the patient continues to have hypoglycemia in next 2     days, then we will have to check for other causes of hypoglycemia. 2. Diabetes mellitus.  The patient has a history of diabetes mellitus,     which most likely is steroid induced.  I am going to obtain     hemoglobin A1c to check the patient's control of blood sugar past 3     months.  The patient may not need the medications including the     glimepiride and she might do  well only on metformin. 3. Recent sepsis with infected left total knee arthroplasty.  The     patient has been on Ancef 1 gram IV q.8 h and we will continue on     that until October 19, 2010. 4. History of scleroderma.  The patient will be continued on     prednisone 5 mg  p.o. b.i.d. 5. History of diastolic dysfunction.  The patient has received IV     fluids in the hospital and there is risk of getting fluid overload     and pulmonary edema, also the chest x-ray showing increased     pulmonary interstitial fluid, but no overt edema at this time.  So     I am going to change the patient's Lasix to 20 mg IV q.12 h until     the patient is stable to prevent fluid overload. 6. Pulmonary artery hypertension.  The patient is currently on     Tracleer and Tyvaso, which will be continued.  Also, continue the     patient on the warfarin. 7. DVT prophylaxis.  The patient is currently on warfarin, so she does     not require any DVT prophylaxis.     Eleonore Chiquito, MD     GL/MEDQ  D:  10/02/2010  T:  10/02/2010  Job:  [id number]  Electronically Signed by Frederich Chick LAMA  on 10/13/2010 11:56:38 AM

## 2010-10-13 NOTE — Discharge Summary (Signed)
[name], [name] NO.:  [id number]  MEDICAL RECORD NO.:  [id number]  LOCATION:  5053                         FACILITY:  [facility]  PHYSICIAN:  Eleonore Chiquito, MD         DATE OF BIRTH:  10-09-1950  DATE OF ADMISSION:  10/02/2010 DATE OF DISCHARGE:                              DISCHARGE SUMMARY   DATE OF DISCHARGE:  To be determined.  ADMISSION DIAGNOSES: 1. Hypoglycemia due to glimepiride and metformin. 2. Recent sepsis due to the infected left total knee arthroplasty,     currently on IV Ancef. 3. History of hypertension. 4. History of diastolic dysfunction. 5. Diabetes mellitus.  DISCHARGE DIAGNOSES: 1. Hypoglycemia, resolved, off the medications at this time period. 2. History of recent sepsis with infected left total knee     arthroplasty.  Currently, IV Keflex until October 19, 2010. 3. History of scleroderma. 4. Chronic kidney disease with creatinine stable at this time. 5. Pulmonary artery hypertension, currently on bosentan and Coumadin     by Dr. Baltazar Apo. 6. Type 2 diabetes mellitus, diet controlled. 7. Hyperlipidemia. 8. Hypertension. 9. Chronic diastolic congestive heart failure, currently compensated. 10.Acute on chronic diastolic heart failure, currently compensated.  TESTS PERFORMED:  In the hospital stay include; 1. The chest x-ray on October 02, 2010, showed interval removal of the     right-sided PICC line placement of left upper extremity post PICC     line.  Unchanged finding of cardiomegaly and prominence of     pulmonary interstitium without frank evidence of pulmonary edema. 2. CT head without contrast on October 02, 2010, showed no acute     abnormality. 3. Knee x-ray on October 04, 2010, showed diffuse soft tissue swelling     and moderate large joint effusion, possible loosening and     pneumatosis involving the femoral component posteriorly and     anteriorly.  CONSULTS:  Include orthopedics consult.  BRIEF HISTORY AND PHYSICAL:   A 60 year old female who was discharged from the hospital September 28, 2010, after she was treated for fever, sepsis, and sent home on Kefzol 1 g IV q.8 hours to be taken until October 19, 2010.  At that time, the patient was found to have hypoglycemia in the hospital and her glimepiride was discontinued. Unfortunately, the patient's glimepiride was given to her by the family members by mistake and the patient was found to have hypoglycemia with a blood sugar in 20s.  So, the patient was brought to the hospital and was admitted to step-down unit for closer monitoring of the blood glucose and was given D10W.  The blood glucose was checked q.1 hour.  For the recent sepsis, the patient was continued on IV Ancef 1 g IV q.8 hours.  BRIEF HOSPITAL COURSE: 1. Hypoglycemia.  The patient's hypoglycemia secondary to the     medications she was taking, which was actually discharged, which     was discontinued on the discharge medication list as per September 28, 2010, and once the new medication was out of system, the patient's     blood sugar improved at this time.  The patient is  not requiring     any D50 ampules and is not on any D10 and her sugars are above 100     without requiring any IV fluids.  The patient is also eating     better.  Her hemoglobin A1c was done in the hospital, which was     6.6.  At this time, the patient should be taken off all oral     hypoglycemic medications because of the recurrent episodes of     hypoglycemia.  I am also going to stop the patient's metformin     because of the underlying renal insufficiency and also cause of     recurrent episodes of hypoglycemia.  So, the patient will check her     blood sugar at home and if her blood sugar continues to rise more     than 150, she will call the primary care provider, at that time,     they can start the patient on low dose of for hypoglycemics, but     would not recommended at this time period. 2. Acute diastolic heart  failure.  The patient was found to be in     acute diastolic congestive heart failure and was given diuresis,     Lasix 20 mg IV q.12 hour, and has diuresed very well.  The patient     should continue to take her medication including the Lasix 40 mg     p.o. daily at home.  Because of underlying renal insufficiency, I     am not going to restart the patient's lisinopril or metolazone at     this time.  The patient is doing fine with Lasix at this time. 3. History of scleroderma.  The patient is currently on chronic     prednisone therapy. 4. Hypertension.  The patient is currently on Tracleer 125 mg p.o.     b.i.d. along with Coumadin. 5. Guaiac positive stools.  The patient in the hospital found to have     guaiac-positive stools, but her hemoglobin has remained stable.  As     of today, her hemoglobin is 8.3, which has remained above 8 over     the past 4 days.  I called up and discussed with Dr. Deatra Ina, GI who     has done the patient's endoscopy in December 2011, and he recommend     the patient to follow up with him as outpatient, as there is no     acute bleeding.  If the patient starts to have black colored stools     or has a frank bleeding, she should come back to the hospital. 6. History of sepsis with fever.  The patient did have fever in the     hospital as high as 101, so orthopedic consult was obtained.  We     also did a knee x-ray, which showed effusion but Dr. Sharol Given has     examined the patient and does not think that the patient has any     active infection going on at this time.  We will continue to treat     the patient with IV access q.8 h 1 g, which she is supposed to take     until October 19, 2010, and which should be continued.  CONDITION ON DISCHARGE:  At this time, the patient is stable for discharge.  DISCHARGE MEDICATIONS:  On discharge include; 1. Excedrin Migraine 1 tablet p.o. daily as needed. 2. Furosemide 20 mg  2 tablets p.o. daily. 3. Digoxin 0.125 mg  p.o. daily. 4. Lyrica 150 mg 1 capsule t.i.d. 5. Nexium 40 mg p.o. twice a day. 6. Prednisone 5 mg 1 tablet p.o. b.i.d. 7. Premarin 1 tablet 0.625 mg p.o. daily. 8. Tracleer 125 mg p.o. twice a day. 9. Vicodin 10/650 one tablet p.o. every 6 hours as needed. 10.Morphine 2.5 mg p.o. daily. 11.Kefzol 1 g IV q.8 hours until October 19, 2010. 12.Potassium chloride 20 mEq p.o. daily. 13.Stop taking metformin.  The patient's INR is subtherapeutic at this time 1.65.  We will check the patient's INR tomorrow morning before discharge and the patient follows up with [facility] to check the PT needs to follow up with them in 3 days to check the PT/INR.  PHYSICAL EXAMINATION:  VITAL SIGNS:  As of October 06, 2010, the patient vitals are blood pressure 133/68, temp is 99.1, heart rate 81, and respiration is 21. CHEST:  Clear to auscultation bilaterally. HEART:  S1 and S2.  Regular in rate and rhythm. ABDOMEN:  Soft and nontender.  No organomegaly. EXTREMITIES:  No cyanosis.  No clubbing.  No edema.  PLAN:  As above.     Eleonore Chiquito, MD     GL/MEDQ  D:  10/06/2010  T:  10/06/2010  Job:  [id number]  cc:   Newt Minion, MD Dr. Helyn App, MD [facility]  Electronically Signed by Eleonore Chiquito  on 10/13/2010 11:56:49 AM

## 2010-10-14 ENCOUNTER — Ambulatory Visit (INDEPENDENT_AMBULATORY_CARE_PROVIDER_SITE_OTHER): Payer: Medicare Other | Admitting: Internal Medicine

## 2010-10-14 ENCOUNTER — Encounter: Payer: Self-pay | Admitting: Internal Medicine

## 2010-10-14 DIAGNOSIS — A419 Sepsis, unspecified organism: Secondary | ICD-10-CM

## 2010-10-14 NOTE — Progress Notes (Signed)
  Subjective:    Patient ID: [name], female    DOB: [birth year], 60 y.o.   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 She underwent resection arthroplasty with spacer placement in June and has been on IV antibiotics for 34 days. Following that surgery she was readmitted to the hospital with fevers which had no proven etiology. It finally resolved and she was discharged home but then readmitted shortly with hypoglycemia. She is back home now receiving IV Ancef. She states that she is feeling better with less pain in her left foot and left knee. She does not believe she's had any problems with her PICC or Ancef. She does not know what other medications she is taking. She was not sure which home health agency was she was working with but we were able to determine that it was with [facility].    Review of Systems     Objective:   Physical Exam  Constitutional: No distress.       She is very groggy during exam and cannot provide many details. No one is with her from her family.  HENT:  Mouth/Throat: Oropharynx is clear and moist. No oropharyngeal exudate.  Musculoskeletal:       Her left arm PICC is normal.  Her left knee is much less swollen and warm. Her left foot incision has healed nicely.          Assessment & Plan:

## 2010-10-14 NOTE — Assessment & Plan Note (Signed)
It does not appear that she is having any further fever and her left knee and foot infection are improving. I will complete 8 more days of her Ancef for a total of 6 weeks of antibiotic therapy.

## 2010-10-14 NOTE — Progress Notes (Signed)
Phone call to [facility] [place].  To continue IV antibiotics for 8 days then remove PIC.  Zigmund Daniel, RN at [facility] read back the order.  Lorne Skeens, RN

## 2010-10-15 NOTE — H&P (Signed)
NAMEWILLETTA, [name] NO.:  [id number]  MEDICAL RECORD NO.:  [id number]  LOCATION:  [facility]                         FACILITY:  [facility]  PHYSICIAN:  Pamela Polite, MD     DATE OF BIRTH:  [birth year]  DATE OF ADMISSION:  09/18/2010 DATE OF DISCHARGE:                             HISTORY & PHYSICAL   PRIMARY CARE PHYSICIAN:  [name].  PULMONOLOGIST:  Pamela Gobble, MD  CARDIOLOGIST:  Pamela Records, MD, [facility]  ORTHOPEDIC SURGEON:  Pamela Minion, MD  CHIEF COMPLAINT:  Fever.  HISTORY OF PRESENT ILLNESS:  [name] is a 60 year old pleasant African American female with history of severe pulmonary hypertension, scleroderma, recently discharged to the hospital 3 days ago after total knee arthroplasty and placement of an antibiotic spacer for septic left knee joint.  Subsequently, she was discharged home on the 3rd on IV Kefzol for 3 weeks and was followed by a home health nurse at her home and the home health nurse while visiting her today felt that her left knee was little more erythematous than usual and also she noted a temp of 102 and hence called [name].  [name] recommended coming to the ER for evaluation.  In addition, the patient also reports that she has been having ongoing diarrhea a couple of times a day for the last 2 days and on evaluation in the ER noted to have possible urinary tract infection. Hence [facility] were consulted for further admission and management.  The patient reports some abdominal discomfort.  No vomiting.  She denies cough, congestion, dysuria, or urinary frequency.  PAST MEDICAL HISTORY:  Significant for: 1. Septic left knee status post total arthroplasty and antibiotic     spacer. 2. Scleroderma. 3. Severe pulmonary hypertension on Coumadin. 4. Type 2 diabetes. 5. History of sickle cell trait. 6. History of hysterectomy. 7. Tubal ligation. 8. GERD. 9. Chronic kidney disease, stage III.  CURRENT MEDICATIONS:   The patient is unsure of her home medicines and discharge medication list only lists, IV Ancef, Coumadin, and Vicodin hence medication list will need to be confirmed.  ALLERGIES:  Include CODEINE and IVP DYE.  SOCIAL HISTORY:  She lives at home, has her son here beside her.  Denies any history of tobacco, alcohol, or illicit drug use.  FAMILY HISTORY:  Significant for coronary disease and hypertension in her mother.  REVIEW OF SYSTEMS:  Negative except per HPI.  PHYSICAL EXAMINATION:  VITAL SIGNS:  On exam, temp is 102.2, pulse is 94, blood pressure 143/56, respirations 16, satting 94% on room air. GENERAL:  This is an obese African American female laying in the stretcher in no acute distress. HEENT:  Pupils are round, equal, and reactive to light.  Extraocular movements intact. NECK:  No JVD or lymphadenopathy. CARDIOVASCULAR SYSTEM:  S1 and S2, regular rate and rhythm. LUNGS:  Clear to auscultation bilaterally. ABDOMEN:  Soft, mild periumbilical tenderness.  No rigidity or rebound. No guarding, no CVA tenderness. EXTREMITIES:  No edema, clubbing, or cyanosis. MUSCULOSKELETAL:  Left knee with a linear surgical incision with minimal serous discharge and sutures were intact.  There is no erythema, swelling, and decreased  range of motion as expected postop.  REVIEW OF LABORATORY DATA:  Shows white count of 16.9, hemoglobin 9.3, platelets 428.  Chemistries, sodium 138, potassium 3.0, chloride 102, bicarb 22, BUN 26, creatinine 1.8, glucose 127.  Urine is cloudy with negative nitrites, large leukocyte esterase, numerous wbc's, few bacteria.  ASSESSMENT AND PLAN:  This is a 60 year old female with: 1. Fever. 2. Leukocytosis. 3. Recent septic arthritis of her left knee status post arthroplasty     and antibiotics pacer on IV Kefzol. 4. Possible urinary tract infection. 5. Diarrhea, rule out C. diff. 6. Scleroderma. 7. Severe pulmonary hypertension. 8. Chronic kidney disease,  stage III.  PLAN: 1. We will check blood cultures since patient is febrile now, also     check urine culture and start empiric IV ceftriaxone.  We will also     get a C. diff PCR.  [name] is already been notified by the ED and     we will check on her knee, but clinically her incision in left knee     appears okay to me, but await [name] opinion as well     and continue her IV Ancef for septic knee. 2. We will await for her home medications to be confirmed before     resuming them. 3. Further management as condition evolves.     Pamela Polite, MD     PJ/MEDQ  D:  09/18/2010  T:  09/18/2010  Job:  [id number]  cc:   Clinic [name] Pamela Minion, MD  Electronically Signed by [name]  on 10/15/2010 05:11:27 PM

## 2010-10-16 ENCOUNTER — Inpatient Hospital Stay: Payer: Medicare Other | Admitting: Emergency Medicine

## 2010-10-16 ENCOUNTER — Ambulatory Visit (INDEPENDENT_AMBULATORY_CARE_PROVIDER_SITE_OTHER): Payer: Self-pay | Admitting: Cardiology

## 2010-10-16 DIAGNOSIS — R0989 Other specified symptoms and signs involving the circulatory and respiratory systems: Secondary | ICD-10-CM

## 2010-10-16 LAB — POCT INR: INR: 1.5

## 2010-10-22 ENCOUNTER — Ambulatory Visit (INDEPENDENT_AMBULATORY_CARE_PROVIDER_SITE_OTHER): Payer: Self-pay | Admitting: Cardiovascular Disease

## 2010-10-22 DIAGNOSIS — R0989 Other specified symptoms and signs involving the circulatory and respiratory systems: Secondary | ICD-10-CM

## 2010-10-22 LAB — POCT INR: INR: 1.8

## 2010-10-29 ENCOUNTER — Ambulatory Visit (INDEPENDENT_AMBULATORY_CARE_PROVIDER_SITE_OTHER): Payer: Medicare Other | Admitting: Emergency Medicine

## 2010-10-29 ENCOUNTER — Encounter: Payer: Self-pay | Admitting: Emergency Medicine

## 2010-10-29 DIAGNOSIS — I2789 Other specified pulmonary heart diseases: Secondary | ICD-10-CM

## 2010-10-29 NOTE — Patient Instructions (Signed)
We will continue Tracleer twice a day We will stop your Tyvaso for now, but will revisit this after you have had your knee surgeries and are stable Your last echocardiogram was in June 2012. We will discuss the timing of repeating this test at your next visit.  We will follow up in 3 months or sooner if you have any problems

## 2010-10-29 NOTE — Assessment & Plan Note (Addendum)
We will continue Tracleer twice a day We will stop your Tyvaso for now, but will revisit this after you have had your knee surgeries and are stable Your last echocardiogram was in June 2012. We will discuss the timing of repeating this test at your next visit.  We will follow up in 3 months or sooner if you have any problems 

## 2010-10-29 NOTE — Progress Notes (Signed)
[name] is a 60 year old woman with scleroderma and severe secondary pulmonary hypertension. She had been treated with bosentan + Coumadin. Initiated Ventavis and Adcirca in the past but unable to tolerate. Finally started on inhaled Tyvaso in 2010. Some delay in titrating up to goal dosing of 9 puffs 4x a day due to compliance issues, but finally able to do so.   By TTE, her PASP improved from 102 mmHg (7/09) to 108mHg (9/10), 6 minute walk improved to 3246m11/2010).   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 That hospitalization ultimately resulted in ICU stay for sepsis, c/b renal insufficiency. TTE on June 29 showed estimated RVSP of 63 mmHg (off Tyvaso). We sent her home on bosentan + coumadin, with plans to restart Tyvaso as outpt once she stabilized. She went home on IV Abx.   Readmitted 09/2010 with fever and leukocytosis, S Cr 1.8 - 2.0. Pulm/CCM consulted to assist w her management of PAH medications.  ROV 10/29/10 -- scleroderma, PAH. Has been hospitalized x3 since our last visit due to infected knee hardware. She is off tyvaso. She is planning to go back for repeat L knee surgery this month. She states that her breathing has been going well. She is on Tracleer. She does not want to restart the Tyvaso at this time. Denies dyspnea, syncope, CP.  She is finished with IV abx.    Gen: Pleasant, well-nourished, in no distress,  Her affect is a little sluggish, ? Effects of narcs  ENT: No lesions,  mouth clear,  oropharynx clear, no postnasal drip  Neck: No JVD, no TMG, no carotid bruits  Lungs: No use of accessory muscles, no dullness to percussion, clear without rales or rhonchi  Cardiovascular: RRR, heart sounds normal, no murmur or gallops, no peripheral edema  Musculoskeletal: No deformities, B  knee pain, good ROM  Neuro: alert, non focal  Skin: Warm, no lesions or rashes  PULMONARY HYPERTENSION, SECONDARY We will continue Tracleer twice a day We will stop your Tyvaso for now, but will revisit this after you have had your knee surgeries and are stable Your last echocardiogram was in June 2012. We will discuss the timing of repeating this test at your next visit.

## 2010-11-03 NOTE — Consult Note (Signed)
NAMEGABREILLE, DARDIS NO.:  [id number]  MEDICAL RECORD NO.:  [id number]  LOCATION:  5527                         FACILITY:  [facility]  PHYSICIAN:  Leigh Aurora, MD      DATE OF BIRTH:  [birth year]  DATE OF CONSULTATION:  09/23/2010 DATE OF DISCHARGE:                                CONSULTATION   REQUESTING PHYSICIAN:  Leana Gamer, MD on [facility].  CHIEF COMPLAINT:  Fever.  HISTORY OF PRESENT ILLNESS:  [name] is a very pleasant 60 year old African American female who I follow for her scleroderma.  She was diagnosed back in the 1990s by Dr. Kristen Loader and apparently had a positive ANA and SSA at that time.  She has also had significant pulmonary hypertension, which has been treated by Dr. Lamonte Sakai.  In general, she has done quite well over the years and has continued on prednisone 5 mg twice a day.  She has not had much skin thickening on examination and has not had other end-organ damage with exception to her pulmonary hypertension.  She was recently admitted to the hospital in late June 2012 and required surgery for an infected left total knee replacement with removal of the arthroplasty and placement of antibiotic spacer.  She also had removal of an external fixator in the left foot at that time.  She had been discharged to home, but was readmitted to the hospital on September 18, 2010, with persistent fevers.  She generally states that she has felt fine, but has persistent left knee pain.  She has had persistent fevers up to 102 degrees Fahrenheit.  She apparently had an extensive Infectious Disease workup and has had negative blood and urine cultures since readmission.  She has also had a normal echocardiogram. I am asked to see her today for possible rheumatologic cause of her persistent fever.  She has also been continued on antibiotics.  REVIEW OF SYSTEMS:  Reviewed.  She denies chills, sweats, weight gain, chest pain, shortness of  breath.  She admits to fever.  She admits to joint pain in the left knee.  She admits to shortness of breath, which is unchanged.  She denies cough.  She denies chest pain.  She denies lower extremity edema.  She denies rash.  All other 14-point review of systems are reviewed and negative.  PAST MEDICAL HISTORY:  Significant for, 1. Scleroderma. 2. Pulmonary hypertension. 3. Type 2 diabetes. 4. Sickle cell anemia. 5. Hysterectomy. 6. Tubal ligation. 7. Gastroesophageal reflux. 8. Chronic kidney disease.  ALLERGIES:  CODEINE and IVP DYE.  CURRENT MEDICATIONS:  Tracleer, digoxin, Lasix, NovoLog, Reglan, Protonix, prednisone 5 mg twice a day, IV vancomycin, warfarin, Tylenol, and hydrocodone.  SOCIAL HISTORY:  She denies alcohol or tobacco use.  FAMILY HISTORY:  Significant for hypertension and coronary artery disease.  PHYSICAL EXAMINATION:  VITAL SIGNS:  Temperature currently 98.5, pulse 70, respirations 20, blood pressure 133/76, O2 saturation 99% on room air. GENERAL:  She is alert and oriented without acute distress. HEENT:  Head is normocephalic.  Mucous membranes are moist. NECK:  Supple without lymphadenopathy. CARDIOVASCULAR:  Regular rate and rhythm. LUNGS:  Clear. ABDOMEN:  Soft  without tenderness and positive bowel sounds. EXTREMITIES:  No edema. NEUROLOGIC:  She has normal strength. MUSCULOSKELETAL:  She has warm and swollen left knee with well-healing surgical wound.  She does not have other evidence of synovitis. SKIN:  She does not have much skin thickening.  Review of her medical record including clinic notes, medications, and labs took place.  Recent labs demonstrates creatinine 1.81, sed rate 112.  White blood cell count 8.8, hemoglobin 7.4, platelet count 223. Alkaline phosphatase 82, AST 25, ALT 9.  Blood cultures, no growth to date.  Urine culture, no growth to date.  Abscess culture from knee aspirate, no growth to date.  Echocardiogram on September 11, 2010, demonstrates ejection fraction of 55% to 60%.  Pulmonary arteries demonstrates moderately increased pressure, no evidence of vegetation is identified.  ASSESSMENT/PLAN:  [name] is a pleasant 60 year old African American female with a history of scleroderma and pulmonary hypertension who presents to the hospital with persistent fever of unknown origin after recent left total knee replacement, infection, and antibiotic spacer placement.  PLAN: 1. I agree with current plan of IV antibiotics.  Although her cultures     have been negative I think that this has to be considered infection     until proven otherwise. 2. I did recommend checking several autoimmune labs including ANA,     rheumatoid factor, double-stranded DNA, Smith antibody, SSA, SSB,     C3, C4, SEL 70, and centromere antibody.  I have also ordered LDH     and haptoglobin because of her anemia. 3. I would continue her current prednisone dose of 5 mg twice a day     for the time being.  I would not recommend stress dose steroids     because of the possible infection.  It is also noted that she has     scleroderma and use of steroids with active scleroderma is somewhat controversial with concern for renal impairment. 4. I will continue to follow her closely.     Leigh Aurora, MD     JB/MEDQ  D:  09/23/2010  T:  09/24/2010  Job:  [id number]  Electronically Signed by Leigh Aurora  on 11/03/2010 08:24:39 AM

## 2010-11-04 ENCOUNTER — Ambulatory Visit (INDEPENDENT_AMBULATORY_CARE_PROVIDER_SITE_OTHER): Payer: Medicare Other | Admitting: *Deleted

## 2010-11-04 DIAGNOSIS — D684 Acquired coagulation factor deficiency: Secondary | ICD-10-CM

## 2010-11-04 DIAGNOSIS — I2789 Other specified pulmonary heart diseases: Secondary | ICD-10-CM

## 2010-11-04 LAB — POCT INR: INR: 2.6

## 2010-11-04 LAB — HEPATIC FUNCTION PANEL
ALT: 5 U/L (ref 0–35)
Total Bilirubin: 0.3 mg/dL (ref 0.3–1.2)

## 2010-11-05 ENCOUNTER — Encounter: Payer: Medicare Other | Admitting: *Deleted

## 2010-11-14 HISTORY — PX: REVISION TOTAL KNEE ARTHROPLASTY: SUR1280

## 2010-11-20 ENCOUNTER — Telehealth: Payer: Self-pay | Admitting: Emergency Medicine

## 2010-11-20 NOTE — Telephone Encounter (Signed)
Spoke with [employer]. She wanted to make sure that pt has not been hospitalized since her 10/07/10 d/c and I advised no per our records. She states nothing further needed.

## 2010-11-24 ENCOUNTER — Ambulatory Visit: Payer: Medicare Other | Admitting: Internal Medicine

## 2010-11-24 ENCOUNTER — Encounter (HOSPITAL_COMMUNITY)
Admission: RE | Admit: 2010-11-24 | Discharge: 2010-11-24 | Disposition: A | Discharge status: Home / Self Care | Payer: Medicare Other | Source: Ambulatory Visit | Attending: Orthopedic Surgery | Admitting: Orthopedic Surgery

## 2010-11-24 LAB — COMPREHENSIVE METABOLIC PANEL
ALT: 8 U/L (ref 0–35)
Albumin: 3.3 g/dL — ABNORMAL LOW (ref 3.5–5.2)
Alkaline Phosphatase: 135 U/L — ABNORMAL HIGH (ref 39–117)
Glucose, Bld: 142 mg/dL — ABNORMAL HIGH (ref 70–99)
Potassium: 4.2 mEq/L (ref 3.5–5.1)
Sodium: 142 mEq/L (ref 135–145)
Total Protein: 8 g/dL (ref 6.0–8.3)

## 2010-11-24 LAB — CBC
HCT: 35.1 % — ABNORMAL LOW (ref 36.0–46.0)
Hemoglobin: 11.5 g/dL — ABNORMAL LOW (ref 12.0–15.0)
MCH: 28.3 pg (ref 26.0–34.0)
RBC: 4.07 MIL/uL (ref 3.87–5.11)

## 2010-11-24 LAB — DIFFERENTIAL
Basophils Relative: 0 % (ref 0–1)
Lymphocytes Relative: 28 % (ref 12–46)
Lymphs Abs: 2.7 10*3/uL (ref 0.7–4.0)
Monocytes Relative: 8 % (ref 3–12)
Neutro Abs: 5.8 10*3/uL (ref 1.7–7.7)
Neutrophils Relative %: 60 % (ref 43–77)

## 2010-11-24 LAB — PROTIME-INR
INR: 2.35 — ABNORMAL HIGH (ref 0.00–1.49)
Prothrombin Time: 26.1 seconds — ABNORMAL HIGH (ref 11.6–15.2)

## 2010-11-25 ENCOUNTER — Inpatient Hospital Stay (HOSPITAL_COMMUNITY): Payer: Medicare Other

## 2010-11-25 ENCOUNTER — Inpatient Hospital Stay (HOSPITAL_COMMUNITY)
Admission: RE | Admit: 2010-11-25 | Discharge: 2010-11-30 | DRG: 467 | Disposition: A | Discharge status: Home Health | Payer: Medicare Other | Source: Ambulatory Visit | Attending: Orthopedic Surgery | Admitting: Orthopedic Surgery

## 2010-11-25 DIAGNOSIS — K219 Gastro-esophageal reflux disease without esophagitis: Secondary | ICD-10-CM | POA: Diagnosis present

## 2010-11-25 DIAGNOSIS — M1710 Unilateral primary osteoarthritis, unspecified knee: Secondary | ICD-10-CM | POA: Diagnosis present

## 2010-11-25 DIAGNOSIS — I2789 Other specified pulmonary heart diseases: Secondary | ICD-10-CM | POA: Diagnosis present

## 2010-11-25 DIAGNOSIS — E785 Hyperlipidemia, unspecified: Secondary | ICD-10-CM | POA: Diagnosis present

## 2010-11-25 DIAGNOSIS — T8450XA Infection and inflammatory reaction due to unspecified internal joint prosthesis, initial encounter: Principal | ICD-10-CM | POA: Diagnosis present

## 2010-11-25 DIAGNOSIS — I5032 Chronic diastolic (congestive) heart failure: Secondary | ICD-10-CM | POA: Diagnosis present

## 2010-11-25 DIAGNOSIS — Z8261 Family history of arthritis: Secondary | ICD-10-CM

## 2010-11-25 DIAGNOSIS — I509 Heart failure, unspecified: Secondary | ICD-10-CM | POA: Diagnosis present

## 2010-11-25 DIAGNOSIS — M009 Pyogenic arthritis, unspecified: Secondary | ICD-10-CM | POA: Diagnosis present

## 2010-11-25 DIAGNOSIS — Z01812 Encounter for preprocedural laboratory examination: Secondary | ICD-10-CM

## 2010-11-25 DIAGNOSIS — Z96659 Presence of unspecified artificial knee joint: Secondary | ICD-10-CM

## 2010-11-25 DIAGNOSIS — E119 Type 2 diabetes mellitus without complications: Secondary | ICD-10-CM | POA: Diagnosis present

## 2010-11-25 DIAGNOSIS — N189 Chronic kidney disease, unspecified: Secondary | ICD-10-CM | POA: Diagnosis present

## 2010-11-25 DIAGNOSIS — IMO0002 Reserved for concepts with insufficient information to code with codable children: Secondary | ICD-10-CM

## 2010-11-25 DIAGNOSIS — I129 Hypertensive chronic kidney disease with stage 1 through stage 4 chronic kidney disease, or unspecified chronic kidney disease: Secondary | ICD-10-CM | POA: Diagnosis present

## 2010-11-25 DIAGNOSIS — Y92009 Unspecified place in unspecified non-institutional (private) residence as the place of occurrence of the external cause: Secondary | ICD-10-CM

## 2010-11-25 DIAGNOSIS — Z7901 Long term (current) use of anticoagulants: Secondary | ICD-10-CM

## 2010-11-25 DIAGNOSIS — Y831 Surgical operation with implant of artificial internal device as the cause of abnormal reaction of the patient, or of later complication, without mention of misadventure at the time of the procedure: Secondary | ICD-10-CM | POA: Diagnosis present

## 2010-11-25 DIAGNOSIS — Z833 Family history of diabetes mellitus: Secondary | ICD-10-CM

## 2010-11-25 DIAGNOSIS — Z79899 Other long term (current) drug therapy: Secondary | ICD-10-CM

## 2010-11-25 DIAGNOSIS — Z8249 Family history of ischemic heart disease and other diseases of the circulatory system: Secondary | ICD-10-CM

## 2010-11-25 LAB — GLUCOSE, CAPILLARY
Glucose-Capillary: 111 mg/dL — ABNORMAL HIGH (ref 70–99)
Glucose-Capillary: 98 mg/dL (ref 70–99)
Glucose-Capillary: 157 mg/dL — ABNORMAL HIGH (ref 70–99)

## 2010-11-26 LAB — CBC
HCT: 25.3 % — ABNORMAL LOW (ref 36.0–46.0)
Hemoglobin: 8.4 g/dL — ABNORMAL LOW (ref 12.0–15.0)
MCH: 28.3 pg (ref 26.0–34.0)
MCHC: 33.2 g/dL (ref 30.0–36.0)

## 2010-11-26 LAB — PROTIME-INR: INR: 3.17 — ABNORMAL HIGH (ref 0.00–1.49)

## 2010-11-26 LAB — GLUCOSE, CAPILLARY
Glucose-Capillary: 133 mg/dL — ABNORMAL HIGH (ref 70–99)
Glucose-Capillary: 139 mg/dL — ABNORMAL HIGH (ref 70–99)

## 2010-11-26 LAB — BASIC METABOLIC PANEL
BUN: 12 mg/dL (ref 6–23)
CO2: 20 mEq/L (ref 19–32)
GFR calc non Af Amer: >60 mL/min (ref >60)
Glucose, Bld: 141 mg/dL — ABNORMAL HIGH (ref 70–99)
Potassium: 4.5 mEq/L (ref 3.5–5.1)

## 2010-11-27 LAB — GLUCOSE, CAPILLARY
Glucose-Capillary: 155 mg/dL — ABNORMAL HIGH (ref 70–99)
Glucose-Capillary: 152 mg/dL — ABNORMAL HIGH (ref 70–99)
Glucose-Capillary: 185 mg/dL — ABNORMAL HIGH (ref 70–99)

## 2010-11-27 LAB — CBC
HCT: 25.5 % — ABNORMAL LOW (ref 36.0–46.0)
MCH: 27.8 pg (ref 26.0–34.0)
MCHC: 32.9 g/dL (ref 30.0–36.0)
MCV: 84.4 fL (ref 78.0–100.0)
RDW: 14.1 % (ref 11.5–15.5)

## 2010-11-27 LAB — BASIC METABOLIC PANEL
CO2: 24 mEq/L (ref 19–32)
Chloride: 96 mEq/L (ref 96–112)
Glucose, Bld: 148 mg/dL — ABNORMAL HIGH (ref 70–99)
Potassium: 4.1 mEq/L (ref 3.5–5.1)
Sodium: 131 mEq/L — ABNORMAL LOW (ref 135–145)

## 2010-11-27 LAB — PROTIME-INR: INR: 2.42 — ABNORMAL HIGH (ref 0.00–1.49)

## 2010-11-28 LAB — GLUCOSE, CAPILLARY
Glucose-Capillary: 149 mg/dL — ABNORMAL HIGH (ref 70–99)
Glucose-Capillary: 132 mg/dL — ABNORMAL HIGH (ref 70–99)

## 2010-11-28 LAB — BASIC METABOLIC PANEL
BUN: 16 mg/dL (ref 6–23)
Calcium: 8.7 mg/dL (ref 8.4–10.5)
Creatinine, Ser: 0.98 mg/dL (ref 0.50–1.10)
GFR calc Af Amer: >60 mL/min (ref >60)

## 2010-11-28 LAB — TYPE AND SCREEN: DAT, IgG: POSITIVE

## 2010-11-28 LAB — CBC
MCH: 28.3 pg (ref 26.0–34.0)
MCHC: 33.8 g/dL (ref 30.0–36.0)
RDW: 13.8 % (ref 11.5–15.5)

## 2010-11-28 LAB — PROTIME-INR
INR: 1.68 — ABNORMAL HIGH (ref 0.00–1.49)
Prothrombin Time: 20.1 seconds — ABNORMAL HIGH (ref 11.6–15.2)

## 2010-11-29 LAB — GLUCOSE, CAPILLARY: Glucose-Capillary: 153 mg/dL — ABNORMAL HIGH (ref 70–99)

## 2010-11-29 LAB — PROTIME-INR: Prothrombin Time: 17.8 seconds — ABNORMAL HIGH (ref 11.6–15.2)

## 2010-11-30 LAB — GLUCOSE, CAPILLARY: Glucose-Capillary: 121 mg/dL — ABNORMAL HIGH (ref 70–99)

## 2010-11-30 LAB — PROTIME-INR
INR: 1.61 — ABNORMAL HIGH (ref 0.00–1.49)
Prothrombin Time: 19.4 seconds — ABNORMAL HIGH (ref 11.6–15.2)

## 2010-12-01 ENCOUNTER — Encounter: Payer: Medicare Other | Admitting: *Deleted

## 2010-12-03 ENCOUNTER — Other Ambulatory Visit: Payer: Self-pay | Admitting: Internal Medicine

## 2010-12-03 NOTE — Discharge Summary (Signed)
  [name], [name] NO.:  [id number]  MEDICAL RECORD NO.:  [id number]  LOCATION:  8341                         FACILITY:  [facility]  PHYSICIAN:  Newt Minion, MD     DATE OF BIRTH:  06/16/1950  DATE OF ADMISSION:  11/25/2010 DATE OF DISCHARGE:  11/30/2010                              DISCHARGE SUMMARY   FINAL DIAGNOSIS:  Complication of total knee arthroplasty with septic joint.  SURGICAL PROCEDURES: 1. Removal of anatomic antibiotic spacer. 2. Revision of total knee arthroplasty with Zimmer components. 3. Tibia with a 100-mm x 13 stem size C femur with a 100 mm x 13     lateral wedge with 17 mm posterior stabilized spacer with no     patella component.  DISCHARGE INSTRUCTIONS:  Discharged to home in stable condition with [facility], home health physical therapy, and durable medical equipment.  Prescription for Percocet for pain.  The patient will continue her preadmission Coumadin.  She was not taken off the Coumadin any time during her hospital stay.  The patient complains of yeast infection, and she was given a one-time dose of Diflucan prior to discharge.  We will place her dressing with Mepilex dressing.  Plan to follow up in the office in 2 weeks.  Discharged to home in stable condition.     Newt Minion, MD     MVD/MEDQ  D:  11/30/2010  T:  11/30/2010  Job:  [id number]  Electronically Signed by Meridee Score MD on 12/03/2010 06:23:39 AM

## 2010-12-03 NOTE — Op Note (Signed)
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  PROCEDURE: 1. Removal of anatomic antibiotic spacer. 2. Revision total knee arthroplasty left knee with Zimmer components     #4 tibia with a 100 mm x 13 stem size C femur with a 100-mm x 13-mm     stem with a lateral 5-mm wedge with a 17-mm posterior stabilized     spacer, no patellar component.  SURGEON:  Newt Minion, MD  ANESTHESIA:  General.  ESTIMATED BLOOD LOSS:  Minimal.  ANTIBIOTICS:  1 g of Kefzol.  DRAINS:  None.  COMPLICATIONS:  None.  TOURNIQUET TIME:  53 minutes at 300 mmHg.  DISPOSITION:  To PACU in stable condition.  INDICATIONS FOR PROCEDURE:  The patient is a 60 year old woman with diabetes, status post removal of infected total knee arthroplasty who presents at this time for revision total knee arthroplasty after the patient has been ambulating on a anatomic antibiotic spacer.  Risks and benefits of surgery were discussed including recurrent infection, neurovascular injury, persistent pain, fracture, DVT, pulmonary embolus, need for additional surgery.  The patient states she understands and wished to proceed at this time.  DESCRIPTION OF PROCEDURE:  The patient was brought to OR room 10, underwent general anesthetic, after adequate level of anesthesia was obtained the patient's left lower extremity was prepped using DuraPrep and draped into sterile  field.  Charlie Pitter was used to cover all exposed skin.  Her previous midline incision was made, carried down to the medial parapatellar retinacular incision.  The antibiotic spaces were removed.  The knee was irrigated with pulsatile lavage, fibrinous tissue was debrided.  Attention was first focused on the femoral and tibial canals, these were both sequentially reamed to a 13-mm for a 13-mm stem. The tibia was resurfaced with approximately 10 mm taken off the tibia with the IM guide for alignment.  The size C femur was used and the posterior chamfer cut was revised to allow for the size C femur.  The lateral component was cut for 5-mm wedge, this was trialed with the femoral and tibial components in place.  The box cut was made for the femur and a 17-mm spacer had good flexion and extension.  There was stable varus and valgus with stressing.  The trial components removed, the knee was irrigated with pulsatile lavage.  The tibial femoral component was cemented in place.  The tibial tray was placed as well as secured and the knee was left in extension until the cement had hardened.  The knee was again irrigated with pulsatile lavage with total of 6 liters of pulsatile lavage were used  throughout the case.  The tourniquet was deflated after 53 minutes.  Hemostasis was obtained.  The retinaculum was closed using #1 Vicryl, subcu was closed using 0 Vicryl, the skin was closed using approximating staples.  The wound was covered with Adaptic orthopedic sponges, ABD dressing, Webril and Coban.  The patient was then extubated and taken to PACU in stable condition.     Newt Minion, MD     MVD/MEDQ  D:  11/25/2010  T:  11/25/2010  Job:  [id number]  Electronically Signed by Meridee Score MD on 12/03/2010 06:23:35 AM

## 2010-12-08 ENCOUNTER — Telehealth: Payer: Self-pay | Admitting: Emergency Medicine

## 2010-12-08 NOTE — Telephone Encounter (Signed)
Looks like we did advise pt to hold Tyvaso until she recovered from knee surgery, and we would discuss this with her at next appt. LMTCB for [employer]

## 2010-12-08 NOTE — Telephone Encounter (Signed)
Spoke with [facility] and notified we advised pt in August to stop Tyvaso until we see her again and will discuss restarting then. She states that in this case, will just put med on hold. I advised will go ahead and call pt to sched her rov for Nov since that is when she is due. I called and spoke with pt. I advised that she needs to go ahead and sched rov for Nov, she declined at this time stating needed to look at her calendar and call back to sched. I have d/w Cecille Rubin and will forward msg to her so that she casn ensure pt calls to sched appt.

## 2010-12-14 HISTORY — PX: TOTAL KNEE REVISION WITH SCAR DEBRIDEMENT/PATELLA REVISION WITH POLY EXCHANGE: SHX6128

## 2010-12-17 NOTE — Telephone Encounter (Signed)
Pt is scheduled for f/u with RB on Mon., Nov 19th @ 12 noon and pt is aware.

## 2010-12-22 ENCOUNTER — Other Ambulatory Visit: Payer: Self-pay | Admitting: *Deleted

## 2010-12-22 MED ORDER — BOSENTAN 125 MG PO TABS: 125.0000 mg | ORAL_TABLET | Freq: Two times a day (BID) | ORAL | Status: DC

## 2010-12-26 ENCOUNTER — Inpatient Hospital Stay (HOSPITAL_COMMUNITY)
Admission: EM | Admit: 2010-12-26 | Discharge: 2010-12-29 | DRG: 903 | Disposition: A | Discharge status: Home Health | Payer: Medicare Other | Attending: Orthopedic Surgery | Admitting: Orthopedic Surgery

## 2010-12-26 ENCOUNTER — Emergency Department (HOSPITAL_COMMUNITY): Payer: Medicare Other

## 2010-12-26 DIAGNOSIS — N189 Chronic kidney disease, unspecified: Secondary | ICD-10-CM | POA: Diagnosis present

## 2010-12-26 DIAGNOSIS — I2789 Other specified pulmonary heart diseases: Secondary | ICD-10-CM | POA: Diagnosis present

## 2010-12-26 DIAGNOSIS — I2510 Atherosclerotic heart disease of native coronary artery without angina pectoris: Secondary | ICD-10-CM | POA: Diagnosis present

## 2010-12-26 DIAGNOSIS — Y92009 Unspecified place in unspecified non-institutional (private) residence as the place of occurrence of the external cause: Secondary | ICD-10-CM

## 2010-12-26 DIAGNOSIS — M349 Systemic sclerosis, unspecified: Secondary | ICD-10-CM | POA: Diagnosis present

## 2010-12-26 DIAGNOSIS — T8131XA Disruption of external operation (surgical) wound, not elsewhere classified, initial encounter: Principal | ICD-10-CM | POA: Diagnosis present

## 2010-12-26 DIAGNOSIS — D573 Sickle-cell trait: Secondary | ICD-10-CM | POA: Diagnosis present

## 2010-12-26 DIAGNOSIS — Z96659 Presence of unspecified artificial knee joint: Secondary | ICD-10-CM

## 2010-12-26 DIAGNOSIS — W010XXA Fall on same level from slipping, tripping and stumbling without subsequent striking against object, initial encounter: Secondary | ICD-10-CM | POA: Diagnosis present

## 2010-12-26 DIAGNOSIS — E119 Type 2 diabetes mellitus without complications: Secondary | ICD-10-CM | POA: Diagnosis present

## 2010-12-26 DIAGNOSIS — I129 Hypertensive chronic kidney disease with stage 1 through stage 4 chronic kidney disease, or unspecified chronic kidney disease: Secondary | ICD-10-CM | POA: Diagnosis present

## 2010-12-26 LAB — DIFFERENTIAL
Basophils Absolute: 0 10*3/uL (ref 0.0–0.1)
Basophils Relative: 0 % (ref 0–1)
Eosinophils Absolute: 0.3 10*3/uL (ref 0.0–0.7)
Eosinophils Relative: 4 % (ref 0–5)
Lymphocytes Relative: 40 % (ref 12–46)
Monocytes Absolute: 0.7 10*3/uL (ref 0.1–1.0)

## 2010-12-26 LAB — CBC
HCT: 34.7 % — ABNORMAL LOW (ref 36.0–46.0)
Hemoglobin: 11.4 g/dL — ABNORMAL LOW (ref 12.0–15.0)
MCH: 28.1 pg (ref 26.0–34.0)
MCHC: 32.9 g/dL (ref 30.0–36.0)
RBC: 4.06 MIL/uL (ref 3.87–5.11)

## 2010-12-26 LAB — GLUCOSE, CAPILLARY: Glucose-Capillary: 85 mg/dL (ref 70–99)

## 2010-12-26 LAB — BASIC METABOLIC PANEL
BUN: 16 mg/dL (ref 6–23)
CO2: 21 mEq/L (ref 19–32)
Calcium: 9.2 mg/dL (ref 8.4–10.5)
GFR calc non Af Amer: >90 mL/min (ref >90)
Glucose, Bld: 94 mg/dL (ref 70–99)

## 2010-12-27 LAB — PROTIME-INR
INR: >10 (ref 0.00–1.49)
INR: 1.72 — ABNORMAL HIGH (ref 0.00–1.49)
Prothrombin Time: 20.5 seconds — ABNORMAL HIGH (ref 11.6–15.2)

## 2010-12-27 LAB — GLUCOSE, CAPILLARY
Glucose-Capillary: 238 mg/dL — ABNORMAL HIGH (ref 70–99)
Glucose-Capillary: 126 mg/dL — ABNORMAL HIGH (ref 70–99)
Glucose-Capillary: 107 mg/dL — ABNORMAL HIGH (ref 70–99)

## 2010-12-27 LAB — CBC
MCH: 27.5 pg (ref 26.0–34.0)
MCV: 85.7 fL (ref 78.0–100.0)
Platelets: 257 10*3/uL (ref 150–400)
RBC: 3.49 MIL/uL — ABNORMAL LOW (ref 3.87–5.11)
RDW: 14.5 % (ref 11.5–15.5)
WBC: 9.4 10*3/uL (ref 4.0–10.5)

## 2010-12-27 LAB — BASIC METABOLIC PANEL
CO2: 20 mEq/L (ref 19–32)
Calcium: 8.8 mg/dL (ref 8.4–10.5)
Creatinine, Ser: 0.92 mg/dL (ref 0.50–1.10)
GFR calc Af Amer: 77 mL/min — ABNORMAL LOW (ref >90)
Sodium: 140 mEq/L (ref 135–145)

## 2010-12-28 LAB — CBC
HCT: 29.7 % — ABNORMAL LOW (ref 36.0–46.0)
MCHC: 32.3 g/dL (ref 30.0–36.0)
Platelets: 244 10*3/uL (ref 150–400)
RDW: 14.7 % (ref 11.5–15.5)
WBC: 7.1 10*3/uL (ref 4.0–10.5)

## 2010-12-28 LAB — GLUCOSE, CAPILLARY
Glucose-Capillary: 135 mg/dL — ABNORMAL HIGH (ref 70–99)
Glucose-Capillary: 112 mg/dL — ABNORMAL HIGH (ref 70–99)

## 2010-12-28 LAB — BASIC METABOLIC PANEL
CO2: 20 mEq/L (ref 19–32)
Calcium: 9.3 mg/dL (ref 8.4–10.5)
Creatinine, Ser: 1.08 mg/dL (ref 0.50–1.10)
GFR calc Af Amer: 64 mL/min — ABNORMAL LOW (ref >90)
GFR calc non Af Amer: 55 mL/min — ABNORMAL LOW (ref >90)
Sodium: 139 mEq/L (ref 135–145)

## 2010-12-28 LAB — PROTIME-INR
INR: 1.96 — ABNORMAL HIGH (ref 0.00–1.49)
Prothrombin Time: 22.7 seconds — ABNORMAL HIGH (ref 11.6–15.2)

## 2010-12-29 ENCOUNTER — Inpatient Hospital Stay (HOSPITAL_COMMUNITY): Payer: Medicare Other

## 2010-12-29 LAB — BASIC METABOLIC PANEL
BUN: 13 mg/dL (ref 6–23)
Calcium: 9.7 mg/dL (ref 8.4–10.5)
GFR calc Af Amer: 74 mL/min — ABNORMAL LOW (ref >90)
GFR calc non Af Amer: 63 mL/min — ABNORMAL LOW (ref >90)
Glucose, Bld: 120 mg/dL — ABNORMAL HIGH (ref 70–99)
Potassium: 3.7 mEq/L (ref 3.5–5.1)
Sodium: 139 mEq/L (ref 135–145)

## 2010-12-29 LAB — GLUCOSE, CAPILLARY: Glucose-Capillary: 132 mg/dL — ABNORMAL HIGH (ref 70–99)

## 2010-12-29 LAB — PROTIME-INR: Prothrombin Time: 19.3 seconds — ABNORMAL HIGH (ref 11.6–15.2)

## 2010-12-30 NOTE — Op Note (Signed)
[name], [name] NO.:  [id number]  MEDICAL RECORD NO.:  [id number]  LOCATION:  9794                         FACILITY:  [facility]  PHYSICIAN:  Newt Minion, MD     DATE OF BIRTH:  Nov 25, 1950  DATE OF PROCEDURE:  12/26/2010 DATE OF DISCHARGE:                              OPERATIVE REPORT   PREOPERATIVE DIAGNOSIS:  Traumatic open left total knee arthroplasty.  POSTOPERATIVE DIAGNOSIS:  Traumatic open left total knee arthroplasty.  PROCEDURE: 1. Irrigation and excisional debridement, left total knee. 2. Placement of antibiotic beads with the vancomycin and gentamicin,     500 mg of vancomycin and 240 mg of gentamicin, 10 mL of stimulant     beads.  SURGEON:  Newt Minion, MD  ANESTHESIA:  General.  ESTIMATED BLOOD LOSS:  Minimal.  Antibiotics with Vancomycin was started preoperatively due to her infection with the open total knee.  COMPLICATION:  None.  TOURNIQUET TIME:  None.  DISPOSITION:  To PACU in stable condition.  INDICATION FOR PROCEDURE:  The patient is a 60 year old woman with diabetes and multiple medical problems including kidney disease, scleroderma, sickle-cell trait, pulmonary hypertension, coronary artery disease, hypertension, who is about a month status post a revision of total knee arthroplasty for the left knee for an infected initial total knee, which was several years out from her initial surgery.  The patient was doing well, was independent about a month out from revision surgery. She was home, fell and landed directly of her knee on a concrete surface sustaining a large open wound to the total knee and presents at this time for urgent irrigation and debridement and placement of antibiotic beads.  Risks and benefits of surgery were discussed including persistent infection, neurovascular injury, failure to eradicate the infection, need for additional surgery.  The patient states she understands and wished to proceed at  this time.  The patient is also currently on Coumadin and is still on her current Coumadin therapy.  DESCRIPTION OF PROCEDURE:  The patient was brought to the OR room #1 and underwent a general anesthetic.  After adequate level of anesthesia was obtained, the patient's left lower extremity was prepped using Hibiclens and draped into a sterile field.  Six liters of pulsatile lavage were used to irrigate out the total knee.  Rongeur, curette, knife were used to excisionally debride any nonviable tissue.  The knee was debrided back to bleeding viable healthy tissue.  There was no purulence, no abscess, no signs of any chronic infection from her revision total knee. After irrigation, debridement and cleansing, antibiotic beads were made on the back table with 500 mg of vancomycin and 240 mg of gentamicin. The antibiotic beads were placed within the knee.  The retinacular layer was closed using #1 PDS.  The skin was closed using 2-0 nylon.  There was no tension on the skin.  The wound was covered with Adaptic, orthopedic sponges, ABD dressing, Kerlix, and Coban.  The patient was placed in a knee immobilizer, extubated and taken to PACU in stable condition.     Newt Minion, MD     MVD/MEDQ  D:  12/26/2010  T:  12/26/2010  Job:  [id number]  Electronically Signed by Meridee Score MD on 12/30/2010 25:95:63 AM

## 2010-12-30 NOTE — Discharge Summary (Signed)
  [name], LUCK NO.:  [id number]  MEDICAL RECORD NO.:  [id number]  LOCATION:  5537                         FACILITY:  [facility]  PHYSICIAN:  Newt Minion, MD     DATE OF BIRTH:  04/15/1950  DATE OF ADMISSION:  12/26/2010 DATE OF DISCHARGE:  12/29/2010                              DISCHARGE SUMMARY   FINAL DIAGNOSIS:  Traumatic open left total knee arthroplasty with dehiscence of the wound.  SURGICAL PROCEDURE: 1. Irrigation and excisional debridement of the open left total knee     arthroplasty. 2. Placement of antibiotic beads.  Discharged to home in stable condition.  [facility]/home health RN for IV vancomycin for 4 weeks dose to be monitored and dose per pharmacy.  Additional procedures PICC line placement on October 16.  Follow up in 2 weeks.  Medications as per her admission medical reconciliation form with no changes.  Continue her Coumadin for DVT prophylaxis and a prescription provided for Percocet for pain.  Laboratory values within normal limits.  Follow up in 2 weeks.  The patient was independent with her transfers and gait training and needs no additional physical therapy.     Newt Minion, MD     MVD/MEDQ  D:  12/29/2010  T:  12/29/2010  Job:  [id number]  Electronically Signed by Meridee Score MD on 12/30/2010 06:28:34 AM

## 2010-12-30 NOTE — Discharge Summary (Signed)
  [name], [name] NO.:  [id number]  MEDICAL RECORD NO.:  [id number]  LOCATION:  0086                         FACILITY:  [facility]  PHYSICIAN:  Newt Minion, MD     DATE OF BIRTH:  August 25, 1950  DATE OF ADMISSION:  12/26/2010 DATE OF DISCHARGE:                              DISCHARGE SUMMARY   DICTATION CANCELLED.     Newt Minion, MD     MVD/MEDQ  D:  12/28/2010  T:  12/28/2010  Job:  [id number]  Electronically Signed by Meridee Score MD on 12/30/2010 93:26:71 AM

## 2011-01-04 ENCOUNTER — Ambulatory Visit (INDEPENDENT_AMBULATORY_CARE_PROVIDER_SITE_OTHER): Payer: Self-pay | Admitting: Cardiology

## 2011-01-04 DIAGNOSIS — I2789 Other specified pulmonary heart diseases: Secondary | ICD-10-CM

## 2011-01-04 DIAGNOSIS — R0989 Other specified symptoms and signs involving the circulatory and respiratory systems: Secondary | ICD-10-CM

## 2011-01-04 DIAGNOSIS — Z7901 Long term (current) use of anticoagulants: Secondary | ICD-10-CM

## 2011-01-04 DIAGNOSIS — D684 Acquired coagulation factor deficiency: Secondary | ICD-10-CM

## 2011-01-04 LAB — POCT INR: INR: 1.5

## 2011-01-11 ENCOUNTER — Ambulatory Visit (INDEPENDENT_AMBULATORY_CARE_PROVIDER_SITE_OTHER): Payer: Self-pay | Admitting: Internal Medicine

## 2011-01-11 DIAGNOSIS — D684 Acquired coagulation factor deficiency: Secondary | ICD-10-CM

## 2011-01-11 DIAGNOSIS — R0989 Other specified symptoms and signs involving the circulatory and respiratory systems: Secondary | ICD-10-CM

## 2011-01-11 DIAGNOSIS — Z7901 Long term (current) use of anticoagulants: Secondary | ICD-10-CM

## 2011-01-11 DIAGNOSIS — I2789 Other specified pulmonary heart diseases: Secondary | ICD-10-CM

## 2011-01-18 ENCOUNTER — Ambulatory Visit (INDEPENDENT_AMBULATORY_CARE_PROVIDER_SITE_OTHER): Payer: Self-pay | Admitting: Cardiology

## 2011-01-18 DIAGNOSIS — I2789 Other specified pulmonary heart diseases: Secondary | ICD-10-CM

## 2011-01-18 DIAGNOSIS — D684 Acquired coagulation factor deficiency: Secondary | ICD-10-CM

## 2011-01-18 DIAGNOSIS — Z7901 Long term (current) use of anticoagulants: Secondary | ICD-10-CM

## 2011-01-18 LAB — POCT INR: INR: 3.5

## 2011-01-25 ENCOUNTER — Ambulatory Visit (INDEPENDENT_AMBULATORY_CARE_PROVIDER_SITE_OTHER): Payer: Self-pay | Admitting: Cardiology

## 2011-01-25 DIAGNOSIS — R0989 Other specified symptoms and signs involving the circulatory and respiratory systems: Secondary | ICD-10-CM

## 2011-01-25 DIAGNOSIS — I2789 Other specified pulmonary heart diseases: Secondary | ICD-10-CM

## 2011-01-25 DIAGNOSIS — D684 Acquired coagulation factor deficiency: Secondary | ICD-10-CM

## 2011-01-25 DIAGNOSIS — Z7901 Long term (current) use of anticoagulants: Secondary | ICD-10-CM

## 2011-02-01 ENCOUNTER — Other Ambulatory Visit (INDEPENDENT_AMBULATORY_CARE_PROVIDER_SITE_OTHER): Payer: Medicare Other

## 2011-02-01 ENCOUNTER — Ambulatory Visit (INDEPENDENT_AMBULATORY_CARE_PROVIDER_SITE_OTHER): Payer: Medicare Other | Admitting: Emergency Medicine

## 2011-02-01 ENCOUNTER — Encounter: Payer: Self-pay | Admitting: Emergency Medicine

## 2011-02-01 VITALS — BP 114/78 | HR 82 | Temp 98.2°F | Ht 64.0 in | Wt 187.4 lb

## 2011-02-01 DIAGNOSIS — I2789 Other specified pulmonary heart diseases: Secondary | ICD-10-CM

## 2011-02-01 LAB — HEPATIC FUNCTION PANEL
Alkaline Phosphatase: 117 U/L (ref 39–117)
Bilirubin, Direct: 0 mg/dL (ref 0.0–0.3)
Total Protein: 7.9 g/dL (ref 6.0–8.3)

## 2011-02-01 NOTE — Patient Instructions (Signed)
We will continue your Tracleer twice a day We will continue your coumadin - please follow up at [facility] anticoagulation clinic We will not restart Tyvaso at this time, but may need to do so in the future We will repeat your echocardiogram in June 2013 You need blood work for your Tracleer every month - we will do this today.  Follow with Dr Lamonte Sakai in 4 months or sooner if you have any problems.

## 2011-02-01 NOTE — Assessment & Plan Note (Signed)
We will continue your Tracleer twice a day We will continue your coumadin - please follow up at [facility] anticoagulation clinic We will not restart Tyvaso at this time, but may need to do so in the future We will repeat your echocardiogram in June 2013 You need blood work for your Tracleer every month - we will do this today.  Follow with [name] in 4 months or sooner if you have any problems.

## 2011-02-01 NOTE — Progress Notes (Signed)
[name] is a 60 year old woman with scleroderma and severe secondary pulmonary hypertension. She had been treated with bosentan + Coumadin. Initiated Ventavis Adcirca in the past but unable to tolerate. Finally started on inhaled Tyvaso in 2010. Some delay in titrating up to goal dosing of 9 puffs 4x a day due to compliance issues, but finally able to do so. This was then stopped in Summer 2012 during admission for septic knee hardware.   By TTE, her PASP improved from 102 mmHg (7/09) to 24mHg (9/10), 6 minute walk improved to 3263m11/2010).   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 That hospitalization ultimately resulted in ICU stay for sepsis, c/b renal insufficiency. TTE on June 29 showed estimated RVSP of 63 mmHg (off Tyvaso). We sent her home on bosentan + coumadin, with plans to restart Tyvaso as outpt once she stabilized. She went home on IV Abx.   Readmitted 09/2010 with fever and leukocytosis, S Cr 1.8 - 2.0. Pulm/CCM consulted to assist w her management of PAH medications.  ROV 10/29/10 -- scleroderma, PAH. Has been hospitalized x3 since our last visit due to infected knee hardware. She is off tyvaso. She is planning to go back for repeat L knee surgery this month. She states that her breathing has been going well. She is on Tracleer. She does not want to restart the Tyvaso at this time. Denies dyspnea, syncope, CP.  She is finished with IV abx.   ROV 02/01/11 -- scleroderma, PAH. Remains on coumadin + bosentan but off Tyvaso as above. Last TTE on June 29 showed estimated RVSP of 63 mmHg (off Tyvaso).  She returns for f/u. Tells me that since last visit she underwent f/u knee sgy to replace hardware. She feels it is doing well, now off abx. She is not interested in going back on Tyvaso. Her breathing  seems to be stable, although she isn't really pushing herself.    Exam Gen: Pleasant, well-nourished, in no distress,  Her affect is a little sluggish but improved compared w last visit  ENT: No lesions,  mouth clear,  oropharynx clear, no postnasal drip  Neck: No JVD, no TMG, no carotid bruits  Lungs: No use of accessory muscles, no dullness to percussion, clear without rales or rhonchi  Cardiovascular: RRR, heart sounds normal, no murmur or gallops, no peripheral edema  Musculoskeletal: No deformities, B knee pain, good ROM  Neuro: alert, non focal  Skin: Warm, no lesions or rashes   09/11/10 TTE Study Conclusions - Left ventricle: The cavity size was normal. Wall thickness was   increased in a pattern of mild LVH. Systolic function was normal.   The estimated ejection fraction was in the range of 55% to 60%.   Wall motion was normal; there were no regional wall motion   abnormalities. Features are consistent with a pseudonormal left   ventricular filling pattern, with concomitant abnormal relaxation   and increased filling pressure (grade 2 diastolic dysfunction). - Mitral valve: Mild regurgitation. - Left atrium: The atrium was mildly dilated. - Right ventricle: The cavity size was mildly dilated. - Right atrium: The atrium was mildly dilated. - Pulmonary arteries: Systolic pressure was moderately increased. PA   peak pressure: 6352mg (S).   Impression:  PULMONARY HYPERTENSION, SECONDARY We will continue your Tracleer  twice a day We will continue your coumadin - please follow up at [facility] anticoagulation clinic We will not restart Tyvaso at this time, but may need to do so in the future We will repeat your echocardiogram in June 2013 You need blood work for your Tracleer every month - we will do this today.  Follow with Dr Lamonte Sakai in 4 months or sooner if you have any problems.

## 2011-02-02 ENCOUNTER — Encounter (HOSPITAL_COMMUNITY): Payer: Self-pay | Admitting: *Deleted

## 2011-02-02 ENCOUNTER — Emergency Department (INDEPENDENT_AMBULATORY_CARE_PROVIDER_SITE_OTHER)
Admission: EM | Admit: 2011-02-02 | Discharge: 2011-02-02 | Disposition: A | Discharge status: Home / Self Care | Payer: Medicare Other | Source: Home / Self Care | Attending: Family Medicine | Admitting: Family Medicine

## 2011-02-02 DIAGNOSIS — N898 Other specified noninflammatory disorders of vagina: Secondary | ICD-10-CM

## 2011-02-02 DIAGNOSIS — N939 Abnormal uterine and vaginal bleeding, unspecified: Secondary | ICD-10-CM

## 2011-02-02 LAB — POCT I-STAT, CHEM 8
Calcium, Ion: 1.16 mmol/L (ref 1.12–1.32)
Chloride: 108 mEq/L (ref 96–112)
Glucose, Bld: 149 mg/dL — ABNORMAL HIGH (ref 70–99)
HCT: 39 % (ref 36.0–46.0)

## 2011-02-02 NOTE — ED Notes (Signed)
Pt states she has been having vaginal bleeding since June.  Started off as spotting, but now is heavier.  States she uses a thin pad all day and it's full at the end of day.  States she had a partial hysterectomy in the past.  Denies pain, urinary sx.  Hx of "bad" vaginal infection.

## 2011-02-02 NOTE — ED Provider Notes (Addendum)
History     CSN: [id number] Arrival date & time: 02/02/2011  8:15 AM   First MD Initiated Contact with Patient 02/02/11 [phone]      Chief Complaint  Patient presents with  . Vaginal Bleeding    (Consider location/radiation/quality/duration/timing/severity/associated sxs/prior treatment) Patient is a 60 y.o. female presenting with vaginal bleeding. The history is provided by the patient.  Vaginal Bleeding This is a chronic problem. The current episode started more than 1 week ago (s/p hyst in 1980's , now vag bleeding 1 pad/day since june, , no pain.). The problem occurs daily. The problem has not changed since onset.Pertinent negatives include no abdominal pain. The symptoms are aggravated by nothing.    Past Medical History  Diagnosis Date  . Secondary pulmonary hypertension     right heart cath 04/20/04  . Cough   . Allergic rhinitis, cause unspecified   . Type II or unspecified type diabetes mellitus without mention of complication, not stated as uncontrolled   . Degeneration of lumbar or lumbosacral intervertebral disc   . Esophageal reflux   . Systemic sclerosis   . Unspecified essential hypertension   . Rotator cuff tear   . Gastritis   . Sickle cell trait   . Arthritis   . Obesity   . Deep venous thrombosis     comadin therapy  . Visual changes   . Scleroderma   . Diastolic dysfunction     Past Surgical History  Procedure Date  . Partial hysterectomy 1983  . Tubal ligation   . Tonsillectomy   . Upper endoscopy with biopsy 03/10/2006  . Right shoulder arthroscopy, subacromial decompression   . Cardiac catheterization 04/24/2004  . Replacement total knee 11/2000    Family History  Problem Relation Age of Onset  . Heart disease Mother   . Diabetes Mother   . Diabetes Sister     History  Substance Use Topics  . Smoking status: Never Smoker   . Smokeless tobacco: Not on file  . Alcohol Use: No    OB History    Grav Para Term Preterm Abortions TAB SAB  Ect Mult Living                  Review of Systems  Constitutional: Negative.   Gastrointestinal: Negative.  Negative for abdominal pain.  Genitourinary: Positive for vaginal bleeding. Negative for vaginal discharge and vaginal pain.    Allergies  Cephalexin; Ciprofloxacin; Codeine; and Iohexol  Home Medications   Current Outpatient Rx  Name Route Sig Dispense Refill  . BOSENTAN 125 MG PO TABS Oral Take 1 tablet (125 mg total) by mouth 2 (two) times daily. 60 tablet 11  . ESOMEPRAZOLE MAGNESIUM 40 MG PO CPDR Oral Take by mouth 2 (two) times daily.      Marland Kitchen ESTROGENS CONJUGATED 0.625 MG PO TABS Oral Take 0.625 mg by mouth daily. Take daily for 21 days then do not take for 7 days.     . FUROSEMIDE 20 MG PO TABS Oral Take 20 mg by mouth.     Marland Kitchen HYDROCODONE-ACETAMINOPHEN PO Oral Take by mouth. 10-650 mg tablets. Take 1 tablet 4 times a day as needed for pain.     . IBUPROFEN 200 MG PO TABS Oral Take 200 mg by mouth every 6 (six) hours as needed.      Marland Kitchen LANOXIN 0.125 MG PO TABS  take 1 tablet by mouth once daily 30 tablet 2    Dispense as written.  Marland Kitchen LISINOPRIL PO  Oral Take by mouth. 10 mg daily.     Marland Kitchen METOCLOPRAMIDE HCL PO Oral Take by mouth. 10 mg tablets. Take one-half tablet before meals and at bedtime.     Marland Kitchen METOLAZONE 2.5 MG PO TABS Oral Take by mouth every morning.      Glory Rosebush LANCETS MISC Does not apply by Does not apply route. Check Blood Glucose 2-3 times a day.     Marland Kitchen PREDNISONE 5 MG PO TABS  take 1 tablet by mouth twice a day 60 tablet 5  . PREGABALIN 150 MG PO CAPS Oral Take by mouth. 1 tablet 3 times a day.     . WARFARIN SODIUM 2.5 MG PO TABS  take by mouth as directed BY COUMADIN CLINIC 100 tablet 3    BP 140/60  Pulse 70  Temp(Src) 98.3 F (36.8 C) (Oral)  Resp 16  SpO2 100%  Physical Exam  Nursing note and vitals reviewed. Constitutional: She appears well-developed and well-nourished.  Abdominal: Soft. Bowel sounds are normal. She exhibits no distension and  no mass. There is no tenderness. There is no rebound and no guarding.  Genitourinary: There is bleeding around the vagina.       endovag friable polyp    ED Course  Procedures (including critical care time)  Labs Reviewed - No data to display No results found.   No diagnosis found.    MDM   i-stat 8 --wnl.       Pauline Good, MD 02/02/11 [id number]  Pauline Good, MD 02/02/11 [phone]

## 2011-03-07 ENCOUNTER — Other Ambulatory Visit: Payer: Self-pay | Admitting: Internal Medicine

## 2011-04-06 ENCOUNTER — Telehealth: Payer: Self-pay | Admitting: Cardiology

## 2011-04-06 DIAGNOSIS — IMO0002 Reserved for concepts with insufficient information to code with codable children: Secondary | ICD-10-CM

## 2011-04-06 NOTE — Telephone Encounter (Signed)
Pt called in this AM to schedule INR check and Labs. Appts scheduled per [facility].

## 2011-04-08 ENCOUNTER — Ambulatory Visit (INDEPENDENT_AMBULATORY_CARE_PROVIDER_SITE_OTHER): Payer: Medicare Other | Admitting: *Deleted

## 2011-04-08 ENCOUNTER — Ambulatory Visit (INDEPENDENT_AMBULATORY_CARE_PROVIDER_SITE_OTHER): Payer: Medicare Other

## 2011-04-08 DIAGNOSIS — E7800 Pure hypercholesterolemia, unspecified: Secondary | ICD-10-CM

## 2011-04-08 DIAGNOSIS — Z7901 Long term (current) use of anticoagulants: Secondary | ICD-10-CM

## 2011-04-08 DIAGNOSIS — D684 Acquired coagulation factor deficiency: Secondary | ICD-10-CM

## 2011-04-08 DIAGNOSIS — I2789 Other specified pulmonary heart diseases: Secondary | ICD-10-CM

## 2011-04-09 LAB — HEPATIC FUNCTION PANEL
ALT: 20 U/L (ref 0–35)
AST: 24 U/L (ref 0–37)
Albumin: 3.3 g/dL — ABNORMAL LOW (ref 3.5–5.2)
Alkaline Phosphatase: 101 U/L (ref 39–117)
Total Protein: 6.8 g/dL (ref 6.0–8.3)

## 2011-04-16 ENCOUNTER — Telehealth: Payer: Self-pay | Admitting: Emergency Medicine

## 2011-04-16 ENCOUNTER — Other Ambulatory Visit: Payer: Self-pay | Admitting: Emergency Medicine

## 2011-04-16 MED ORDER — PREDNISONE 5 MG PO TABS: ORAL_TABLET | ORAL | Status: DC

## 2011-04-16 NOTE — Telephone Encounter (Signed)
Rx has been sent and pt is aware. Nothing further was needed

## 2011-04-16 NOTE — Telephone Encounter (Signed)
No med to fill.

## 2011-05-03 ENCOUNTER — Encounter: Payer: Self-pay | Admitting: Internal Medicine

## 2011-05-03 ENCOUNTER — Ambulatory Visit (INDEPENDENT_AMBULATORY_CARE_PROVIDER_SITE_OTHER): Payer: Medicare Other | Admitting: Internal Medicine

## 2011-05-03 DIAGNOSIS — R0602 Shortness of breath: Secondary | ICD-10-CM

## 2011-05-03 DIAGNOSIS — I519 Heart disease, unspecified: Secondary | ICD-10-CM

## 2011-05-03 DIAGNOSIS — I5189 Other ill-defined heart diseases: Secondary | ICD-10-CM

## 2011-05-03 DIAGNOSIS — I509 Heart failure, unspecified: Secondary | ICD-10-CM

## 2011-05-03 DIAGNOSIS — I2789 Other specified pulmonary heart diseases: Secondary | ICD-10-CM

## 2011-05-03 DIAGNOSIS — R0609 Other forms of dyspnea: Secondary | ICD-10-CM

## 2011-05-03 DIAGNOSIS — I272 Pulmonary hypertension, unspecified: Secondary | ICD-10-CM

## 2011-05-03 LAB — PROTIME-INR: INR: 2.4 ratio — ABNORMAL HIGH (ref 0.8–1.0)

## 2011-05-03 LAB — HEPATIC FUNCTION PANEL
Bilirubin, Direct: 0.1 mg/dL (ref 0.0–0.3)
Total Bilirubin: 0.2 mg/dL — ABNORMAL LOW (ref 0.3–1.2)

## 2011-05-03 LAB — BASIC METABOLIC PANEL
Calcium: 9.5 mg/dL (ref 8.4–10.5)
Chloride: 105 mEq/L (ref 96–112)
Creatinine, Ser: 0.7 mg/dL (ref 0.4–1.2)
GFR: 109.92 mL/min (ref >60.00)

## 2011-05-03 NOTE — Progress Notes (Signed)
HPI Patient is a 61 year old with a history of scleroderma and  pulmonary hypertension. She was last seen in clinic by Kathleen Argue.   She has a history of normal coronary arteries at cath.   She is followed by R. Byrum.  She has tolerated Bosentan but not Tyvaso.   She was last in cardiology clinic last summer.  Since then has been seen twice in pulmonary. She says her breathing has been stable.  No CP.  No PND    No syncope.  Allergies  Allergen Reactions  . Cephalexin   . Ciprofloxacin   . Codeine     REACTION: GI upset  . Iohexol      Code: HIVES, Desc: pt breaks out in large hives. needs full premeds, Onset Date: [id number]     Current Outpatient Prescriptions  Medication Sig Dispense Refill  . bosentan (TRACLEER) 125 MG tablet Take 1 tablet (125 mg total) by mouth 2 (two) times daily.  60 tablet  11  . esomeprazole (NEXIUM) 40 MG capsule Take by mouth 2 (two) times daily.        Marland Kitchen estrogens, conjugated, (PREMARIN) 0.625 MG tablet Take 0.625 mg by mouth daily. Take daily for 21 days then do not take for 7 days.       . furosemide (LASIX) 20 MG tablet Take 20 mg by mouth.       . gabapentin (NEURONTIN) 100 MG capsule Take 100 mg by mouth 2 (two) times daily.      Marland Kitchen HYDROCODONE-ACETAMINOPHEN PO Take by mouth. 10-650 mg tablets. Take 1 tablet 4 times a day as needed for pain.       Marland Kitchen ibuprofen (ADVIL) 200 MG tablet Take 200 mg by mouth every 6 (six) hours as needed.        Marland Kitchen LANOXIN 0.125 MG tablet take 1 tablet by mouth once daily  30 tablet  6  . METOCLOPRAMIDE HCL PO Take by mouth. 10 mg tablets. Take one-half tablet before meals and at bedtime.       . ONE TOUCH LANCETS MISC by Does not apply route. Check Blood Glucose 2-3 times a day.       . predniSONE (DELTASONE) 5 MG tablet 1 tablet twice a day  60 tablet  3  . pregabalin (LYRICA) 150 MG capsule Take by mouth. 1 tablet 3 times a day.       . warfarin (COUMADIN) 2.5 MG tablet take by mouth as directed BY COUMADIN CLINIC  100 tablet   3    Past Medical History  Diagnosis Date  . Secondary pulmonary hypertension     right heart cath 04/20/04  . Cough   . Allergic rhinitis, cause unspecified   . Type II or unspecified type diabetes mellitus without mention of complication, not stated as uncontrolled   . Degeneration of lumbar or lumbosacral intervertebral disc   . Esophageal reflux   . Systemic sclerosis   . Unspecified essential hypertension   . Rotator cuff tear   . Gastritis   . Sickle cell trait   . Arthritis   . Obesity   . Deep venous thrombosis     comadin therapy  . Visual changes   . Scleroderma   . Diastolic dysfunction     Past Surgical History  Procedure Date  . Partial hysterectomy 1983  . Tubal ligation   . Tonsillectomy   . Upper endoscopy with biopsy 03/10/2006  . Right shoulder arthroscopy, subacromial decompression   .  Cardiac catheterization 04/24/2004  . Replacement total knee 11/2000    Family History  Problem Relation Age of Onset  . Heart disease Mother   . Diabetes Mother   . Diabetes Sister     History   Social History  . Marital Status: Divorced    Spouse Name: N/A    Number of Children: 2  . Years of Education: N/A   Occupational History  . Umemployed    Social History Main Topics  . Smoking status: Never Smoker   . Smokeless tobacco: Not on file  . Alcohol Use: No  . Drug Use: No  . Sexually Active: No   Other Topics Concern  . Not on file   Social History Narrative    FAMILY HISTORY:  Significant for coronary artery disease and diabetes    Review of Systems:  All systems reviewed.  They are negative to the above problem except as previously stated.  Vital Signs: BP 128/72  Pulse 75  Ht 5' 4" (1.626 m)  Wt 203 lb 1.9 oz (92.135 kg)  BMI 34.87 kg/m2  Physical Exam Patient is in NAD  HEENT:  Normocephalic, atraumatic. EOMI, PERRLA.  Neck: JVP is normal. No thyromegaly. No bruits.  Lungs: clear to auscultation. No rales no wheezes.  Heart:  Regular rate and rhythm. Normal S1.  Increased P2.   No significant murmurs. PMI not displaced.  Abdomen:  Supple, nontender. Normal bowel sounds. No masses. No hepatomegaly.  Extremities:   Good distal pulses throughout. No lower extremity edema.  Musculoskeletal :moving all extremities.  Neuro:   alert and oriented x3.  CN II-XII grossly intact.   Assessment and Plan:

## 2011-05-03 NOTE — Assessment & Plan Note (Signed)
No changes in meds.  Volume status is not bad.

## 2011-05-03 NOTE — Assessment & Plan Note (Signed)
Will arrange for f/u with R. Byrum.  Keep on coumadin.

## 2011-05-03 NOTE — Patient Instructions (Signed)
Lab work today. We will call you with results.  Follow up with Dr.Byrum in March

## 2011-05-04 ENCOUNTER — Ambulatory Visit (INDEPENDENT_AMBULATORY_CARE_PROVIDER_SITE_OTHER): Payer: Self-pay | Admitting: Cardiology

## 2011-05-04 DIAGNOSIS — Z7901 Long term (current) use of anticoagulants: Secondary | ICD-10-CM

## 2011-05-04 DIAGNOSIS — D684 Acquired coagulation factor deficiency: Secondary | ICD-10-CM

## 2011-05-04 DIAGNOSIS — R0989 Other specified symptoms and signs involving the circulatory and respiratory systems: Secondary | ICD-10-CM

## 2011-05-04 DIAGNOSIS — I2789 Other specified pulmonary heart diseases: Secondary | ICD-10-CM

## 2011-05-04 LAB — DIGOXIN LEVEL: Digoxin Level: 0.7 ng/mL — ABNORMAL LOW (ref 0.8–2.0)

## 2011-05-26 ENCOUNTER — Telehealth: Payer: Self-pay | Admitting: Internal Medicine

## 2011-05-27 NOTE — Telephone Encounter (Signed)
Close

## 2011-06-07 ENCOUNTER — Ambulatory Visit: Payer: Medicare Other | Admitting: Emergency Medicine

## 2011-06-21 ENCOUNTER — Other Ambulatory Visit: Payer: Self-pay | Admitting: Family Medicine

## 2011-06-21 ENCOUNTER — Telehealth: Payer: Self-pay | Admitting: Internal Medicine

## 2011-06-21 ENCOUNTER — Encounter: Payer: Self-pay | Admitting: Emergency Medicine

## 2011-06-21 ENCOUNTER — Ambulatory Visit (INDEPENDENT_AMBULATORY_CARE_PROVIDER_SITE_OTHER): Payer: Medicare Other | Admitting: Emergency Medicine

## 2011-06-21 VITALS — BP 118/72 | HR 90 | Temp 98.0°F | Ht 64.0 in | Wt 212.0 lb

## 2011-06-21 DIAGNOSIS — M349 Systemic sclerosis, unspecified: Secondary | ICD-10-CM

## 2011-06-21 DIAGNOSIS — Z1231 Encounter for screening mammogram for malignant neoplasm of breast: Secondary | ICD-10-CM

## 2011-06-21 DIAGNOSIS — I272 Pulmonary hypertension, unspecified: Secondary | ICD-10-CM

## 2011-06-21 DIAGNOSIS — I2789 Other specified pulmonary heart diseases: Secondary | ICD-10-CM

## 2011-06-21 NOTE — Telephone Encounter (Signed)
Pt requested liver function test to be added to coumadin appt, need order

## 2011-06-21 NOTE — Progress Notes (Signed)
Addended by: Francesca Jewett A on: 06/21/2011 02:30 PM   Modules accepted: Orders

## 2011-06-21 NOTE — Progress Notes (Signed)
[name] is a 61 year old woman with scleroderma and severe secondary pulmonary hypertension. She had been treated with bosentan + Coumadin. Initiated Ventavis Adcirca in the past but unable to tolerate. Finally started on inhaled Tyvaso in 2010. Some delay in titrating up to goal dosing of 9 puffs 4x a day due to compliance issues, but finally able to do so. This was then stopped in Summer 2012 during admission for septic knee hardware.   By TTE, her PASP improved from 102 mmHg (7/09) to 4mHg (9/10), 6 minute walk improved to 3266m11/2010).   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 That hospitalization ultimately resulted in ICU stay for sepsis, c/b renal insufficiency. TTE on June 29 showed estimated RVSP of 63 mmHg (off Tyvaso). We sent her home on bosentan + coumadin, with plans to restart Tyvaso as outpt once she stabilized. She went home on IV Abx.   Readmitted 09/2010 with fever and leukocytosis, S Cr 1.8 - 2.0. Pulm/CCM consulted to assist w her management of PAH medications.  ROV 10/29/10 -- scleroderma, PAH. Has been hospitalized x3 since our last visit due to infected knee hardware. She is off tyvaso. She is planning to go back for repeat L knee surgery this month. She states that her breathing has been going well. She is on Tracleer. She does not want to restart the Tyvaso at this time. Denies dyspnea, syncope, CP.  She is finished with IV abx.   ROV 02/01/11 -- scleroderma, PAH. Remains on coumadin + bosentan but off Tyvaso as above. Last TTE on June 29 showed estimated RVSP of 63 mmHg (off Tyvaso).  She returns for f/u. Tells me that since last visit she underwent f/u knee sgy to replace hardware. She feels it is doing well, now off abx. She is not interested in going back on Tyvaso. Her breathing  seems to be stable, although she isn't really pushing herself.   ROV 06/21/11 -- scleroderma, PAH. Remains on coumadin + bosentan but off Tyvaso. She clinically feels well, has been able to increase her activity but PAP's have increased by TTE 09/11/10. We have planned for repeat TTE in June 2013. Remains on Pred 45m80md for her scleroderma. She asks today about whether a FXa inhibitor would be possible for her, but I don't know of any [facility]. Due for LFT's, wants to get them at coumadin clinic if possible, she will call to arrange.    Exam Gen: Pleasant, well-nourished, in no distress,    ENT: No lesions,  mouth clear,  oropharynx clear, no postnasal drip  Neck: No JVD, no TMG, no carotid bruits  Lungs: No use of accessory muscles, no dullness to percussion, clear without rales or rhonchi  Cardiovascular: RRR, heart sounds normal, no murmur or gallops, no peripheral edema  Musculoskeletal: No deformities, B knee pain, good ROM  Neuro: alert, non focal  Skin: Warm, no lesions or rashes  09/11/10 TTE Study Conclusions - Left ventricle: The cavity size was normal. Wall thickness was   increased in a pattern of mild LVH. Systolic function was normal.   The estimated ejection fraction was in the range of 55% to 60%.   Wall motion was normal; there were no regional wall motion   abnormalities. Features are consistent  with a pseudonormal left   ventricular filling pattern, with concomitant abnormal relaxation   and increased filling pressure (grade 2 diastolic dysfunction). - Mitral valve: Mild regurgitation. - Left atrium: The atrium was mildly dilated. - Right ventricle: The cavity size was mildly dilated. - Right atrium: The atrium was mildly dilated. - Pulmonary arteries: Systolic pressure was moderately increased. PA   peak pressure: 55m Hg (S).   Impression:  PULMONARY HYPERTENSION, SECONDARY - continue coumadin, but I encouraged her to ask at the  anticoagulation clinic whether a Xa inhibitor is an option?? - continue the bosentan, monthly LFT - repeat TTE in 6/13 then f/u to review.   SCLERODERMA Continue current pred

## 2011-06-21 NOTE — Assessment & Plan Note (Signed)
Continue current pred

## 2011-06-21 NOTE — Telephone Encounter (Signed)
Per pt Dr Lamonte Sakai gets blood work on her every month.  Per Dr Agustina Caroli note she is to have LFT's with her next blood draw.  Order will be placed

## 2011-06-21 NOTE — Patient Instructions (Signed)
Please continue your current medications We will check your bloodwork this month and every month Repeat echocardiogram in June 2013 Follow with Dr Lamonte Sakai in June after the echocardiogram

## 2011-06-21 NOTE — Telephone Encounter (Signed)
Line busy - pt just had LFT's done in February that were OK.  Not sure why she wants them repeated or what Dr ordered that if any.

## 2011-06-21 NOTE — Assessment & Plan Note (Signed)
-  continue coumadin, but I encouraged her to ask at the anticoagulation clinic whether a Xa inhibitor is an option?? - continue the bosentan, monthly LFT - repeat TTE in 6/13 then f/u to review.

## 2011-06-22 ENCOUNTER — Ambulatory Visit (INDEPENDENT_AMBULATORY_CARE_PROVIDER_SITE_OTHER): Payer: Medicare Other | Admitting: *Deleted

## 2011-06-22 ENCOUNTER — Other Ambulatory Visit (INDEPENDENT_AMBULATORY_CARE_PROVIDER_SITE_OTHER): Payer: Medicare Other

## 2011-06-22 DIAGNOSIS — I2789 Other specified pulmonary heart diseases: Secondary | ICD-10-CM

## 2011-06-22 DIAGNOSIS — Z7901 Long term (current) use of anticoagulants: Secondary | ICD-10-CM

## 2011-06-22 DIAGNOSIS — I272 Pulmonary hypertension, unspecified: Secondary | ICD-10-CM

## 2011-06-22 DIAGNOSIS — D684 Acquired coagulation factor deficiency: Secondary | ICD-10-CM

## 2011-06-22 LAB — HEPATIC FUNCTION PANEL
Bilirubin, Direct: 0 mg/dL (ref 0.0–0.3)
Total Bilirubin: 0.3 mg/dL (ref 0.3–1.2)

## 2011-06-22 LAB — POCT INR: INR: 2.4

## 2011-07-13 ENCOUNTER — Ambulatory Visit: Payer: Medicare Other

## 2011-07-27 ENCOUNTER — Encounter: Payer: Self-pay | Admitting: Pharmacist

## 2011-08-03 ENCOUNTER — Ambulatory Visit (INDEPENDENT_AMBULATORY_CARE_PROVIDER_SITE_OTHER): Payer: Medicare Other | Admitting: *Deleted

## 2011-08-03 DIAGNOSIS — I2789 Other specified pulmonary heart diseases: Secondary | ICD-10-CM

## 2011-08-03 DIAGNOSIS — D684 Acquired coagulation factor deficiency: Secondary | ICD-10-CM

## 2011-08-03 DIAGNOSIS — Z7901 Long term (current) use of anticoagulants: Secondary | ICD-10-CM

## 2011-08-03 LAB — HEPATIC FUNCTION PANEL
ALT: 15 U/L (ref 0–35)
AST: 16 U/L (ref 0–37)
Bilirubin, Direct: 0 mg/dL (ref 0.0–0.3)
Total Bilirubin: 0.4 mg/dL (ref 0.3–1.2)

## 2011-08-03 LAB — POCT INR: INR: 2.1

## 2011-08-05 ENCOUNTER — Ambulatory Visit
Admission: RE | Admit: 2011-08-05 | Discharge: 2011-08-05 | Disposition: A | Discharge status: Home / Self Care | Payer: Medicare Other | Source: Ambulatory Visit | Attending: Family Medicine | Admitting: Family Medicine

## 2011-08-05 DIAGNOSIS — Z1231 Encounter for screening mammogram for malignant neoplasm of breast: Secondary | ICD-10-CM

## 2011-08-10 NOTE — Progress Notes (Signed)
Quick Note:  Called, spoke with pt. I informed her LFTs normal and to cont same meds per RB. She verbalized understanding of this and voiced no further questions/concerns at this time. ______

## 2011-08-11 ENCOUNTER — Other Ambulatory Visit: Payer: Self-pay | Admitting: Emergency Medicine

## 2011-08-16 ENCOUNTER — Other Ambulatory Visit: Payer: Self-pay | Admitting: Emergency Medicine

## 2011-08-16 ENCOUNTER — Other Ambulatory Visit (HOSPITAL_COMMUNITY): Payer: Medicare Other

## 2011-08-18 ENCOUNTER — Other Ambulatory Visit (HOSPITAL_COMMUNITY): Payer: Medicare Other

## 2011-08-18 ENCOUNTER — Ambulatory Visit (HOSPITAL_COMMUNITY): Payer: Medicare Other | Attending: Cardiovascular Disease | Admitting: Radiology

## 2011-08-18 DIAGNOSIS — I359 Nonrheumatic aortic valve disorder, unspecified: Secondary | ICD-10-CM | POA: Insufficient documentation

## 2011-08-18 DIAGNOSIS — E119 Type 2 diabetes mellitus without complications: Secondary | ICD-10-CM | POA: Insufficient documentation

## 2011-08-18 DIAGNOSIS — I517 Cardiomegaly: Secondary | ICD-10-CM | POA: Insufficient documentation

## 2011-08-18 DIAGNOSIS — R0989 Other specified symptoms and signs involving the circulatory and respiratory systems: Secondary | ICD-10-CM | POA: Insufficient documentation

## 2011-08-18 DIAGNOSIS — I059 Rheumatic mitral valve disease, unspecified: Secondary | ICD-10-CM | POA: Insufficient documentation

## 2011-08-18 DIAGNOSIS — M349 Systemic sclerosis, unspecified: Secondary | ICD-10-CM | POA: Insufficient documentation

## 2011-08-18 DIAGNOSIS — I2789 Other specified pulmonary heart diseases: Secondary | ICD-10-CM

## 2011-08-18 DIAGNOSIS — R0609 Other forms of dyspnea: Secondary | ICD-10-CM | POA: Insufficient documentation

## 2011-08-18 DIAGNOSIS — I10 Essential (primary) hypertension: Secondary | ICD-10-CM | POA: Insufficient documentation

## 2011-08-18 DIAGNOSIS — E785 Hyperlipidemia, unspecified: Secondary | ICD-10-CM | POA: Insufficient documentation

## 2011-08-18 DIAGNOSIS — I079 Rheumatic tricuspid valve disease, unspecified: Secondary | ICD-10-CM | POA: Insufficient documentation

## 2011-08-18 DIAGNOSIS — I509 Heart failure, unspecified: Secondary | ICD-10-CM | POA: Insufficient documentation

## 2011-08-18 NOTE — Progress Notes (Signed)
Echocardiogram performed.  

## 2011-08-22 ENCOUNTER — Other Ambulatory Visit: Payer: Self-pay | Admitting: Internal Medicine

## 2011-08-23 ENCOUNTER — Telehealth: Payer: Self-pay | Admitting: Emergency Medicine

## 2011-08-23 MED ORDER — WARFARIN SODIUM 2.5 MG PO TABS: ORAL_TABLET | ORAL | Status: DC

## 2011-08-23 NOTE — Telephone Encounter (Signed)
Spoke with pt. I advised refill for coumadin was sent, but the lyrica will need to be deferred to her PCP or Dr. Amil Amen. RB did not prescribe this for her. Pt verbalized understanding and states nothing further needed.

## 2011-10-04 ENCOUNTER — Ambulatory Visit (INDEPENDENT_AMBULATORY_CARE_PROVIDER_SITE_OTHER): Payer: Medicare Other | Admitting: Emergency Medicine

## 2011-10-04 ENCOUNTER — Encounter: Payer: Self-pay | Admitting: Emergency Medicine

## 2011-10-04 VITALS — BP 140/74 | HR 74 | Temp 98.3°F | Ht 64.0 in | Wt 207.2 lb

## 2011-10-04 DIAGNOSIS — I2789 Other specified pulmonary heart diseases: Secondary | ICD-10-CM

## 2011-10-04 NOTE — Patient Instructions (Addendum)
Please continue your coumadin and Tracleer as ordered Follow with Dr Lamonte Sakai in 1 month to discuss possibly restarting tyvaso

## 2011-10-04 NOTE — Progress Notes (Signed)
[name] is a 61 year old woman with scleroderma and severe secondary pulmonary hypertension. She had been treated with bosentan + Coumadin. Initiated [name], Adcirca in the past but unable to tolerate. Finally started on inhaled Tyvaso in 2010. Some delay in titrating up to goal dosing of 9 puffs 4x a day due to compliance issues, but finally able to do so. This was then stopped in Summer 2012 during admission for septic knee hardware.   By TTE, her PASP improved from 102 mmHg (7/09) to 78mHg (9/10), 6 minute walk improved to 3274m11/2010).   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 That hospitalization ultimately resulted in ICU stay for sepsis, c/b renal insufficiency. TTE on June 29 showed estimated RVSP of 63 mmHg (off Tyvaso). We sent her home on bosentan + coumadin, with plans to restart Tyvaso as outpt once she stabilized. She went home on IV Abx.   Readmitted 09/2010 with fever and leukocytosis, S Cr 1.8 - 2.0. Pulm/CCM consulted to assist w her management of PAH medications.  ROV 10/29/10 -- scleroderma, PAH. Has been hospitalized x3 since our last visit due to infected knee hardware. She is off tyvaso. She is planning to go back for repeat L knee surgery this month. She states that her breathing has been going well. She is on Tracleer. She does not want to restart the Tyvaso at this time. Denies dyspnea, syncope, CP.  She is finished with IV abx.   ROV 02/01/11 -- scleroderma, PAH. Remains on coumadin + bosentan but off Tyvaso as above. Last TTE on June 29 showed estimated RVSP of 63 mmHg (off Tyvaso).  She returns for f/u. Tells me that since last visit she underwent f/u knee sgy to replace hardware. She feels it is doing well, now off abx. She is not interested in going back on Tyvaso. Her breathing  seems to be stable, although she isn't really pushing herself.   ROV 06/21/11 -- scleroderma, PAH. Remains on coumadin + bosentan but off Tyvaso. She clinically feels well, has been able to increase her activity but PAP's have increased by TTE 09/11/10. We have planned for repeat TTE in June 2013. Remains on Pred 76m51md for her scleroderma. She asks today about whether a FXa inhibitor would be possible for her, but I don't know of any studies on these meds [facility]. Due for LFT's, wants to get them at coumadin clinic if possible, she will call to arrange.   ROV 10/04/11 -- scleroderma, PAH. Remains on coumadin + bosentan but off Tyvaso. TTE on 08/18/11 shows PASP rising, now 876m57m(from 63 08/2010; her best was 42 in 11/2008). Clinically, she feels well - no breathing complaints. Her wt has been stable, trace edema without change. She ambulates and does her usual activities, denies dyspnea.    Exam Filed Vitals:   10/04/11 0857  BP: 140/74  Pulse: 74  Temp: 98.3 F (36.8 C)   Gen: Pleasant, well-nourished, in no distress,    ENT: No lesions,  mouth clear,  oropharynx clear, no postnasal drip  Neck: No JVD, no TMG, no carotid bruits  Lungs: No use of accessory muscles, no dullness to percussion, clear without rales or rhonchi  Cardiovascular: RRR, heart sounds normal, no murmur or gallops, no peripheral edema  Musculoskeletal: No deformities,  L knee pain, good ROM, trace pretibial edema.   Neuro: alert, non focal  Skin: Warm, no lesions or rashes   TTE 08/18/11:  - Left ventricle: The cavity size was normal. Wall thickness was normal. Systolic function was normal. The estimated ejection fraction was in the range of 55% to 60%. - Aortic valve: Mild regurgitation. - Mitral valve: Mild regurgitation. - Left atrium: The atrium was mildly dilated. - Right ventricle: The cavity size was moderately dilated. Systolic function was mildly reduced. - Right atrium: The atrium was mildly  dilated. - Tricuspid valve: Moderate regurgitation. - Pulmonary arteries: Systolic pressure was severely increased. PA peak pressure: 59m Hg (S).  Impression:  PULMONARY HYPERTENSION, SECONDARY Discussed her increasing PASP. She is very adverse to going back on Tyvaso. She has failed Adcirca and Revatio.  - I have pushed for restart Tyvaso, she is resistant to this. . I will give her a month to think about it and then rediscuss - coumadin + tracleer - rov in 1 month

## 2011-10-04 NOTE — Assessment & Plan Note (Signed)
Discussed her increasing PASP. She is very adverse to going back on Tyvaso. She has failed Adcirca and Revatio.  - I have pushed for restart Tyvaso, she is resistant to this. . I will give her a month to think about it and then rediscuss - coumadin + tracleer - rov in 1 month

## 2011-10-12 ENCOUNTER — Other Ambulatory Visit: Payer: Self-pay | Admitting: Internal Medicine

## 2011-11-09 ENCOUNTER — Ambulatory Visit (INDEPENDENT_AMBULATORY_CARE_PROVIDER_SITE_OTHER): Payer: Medicare Other | Admitting: *Deleted

## 2011-11-09 ENCOUNTER — Ambulatory Visit (INDEPENDENT_AMBULATORY_CARE_PROVIDER_SITE_OTHER): Payer: Medicare Other

## 2011-11-09 DIAGNOSIS — Z7901 Long term (current) use of anticoagulants: Secondary | ICD-10-CM

## 2011-11-09 DIAGNOSIS — D684 Acquired coagulation factor deficiency: Secondary | ICD-10-CM

## 2011-11-09 DIAGNOSIS — E7800 Pure hypercholesterolemia, unspecified: Secondary | ICD-10-CM

## 2011-11-09 DIAGNOSIS — I2789 Other specified pulmonary heart diseases: Secondary | ICD-10-CM

## 2011-11-09 DIAGNOSIS — I10 Essential (primary) hypertension: Secondary | ICD-10-CM

## 2011-11-09 LAB — HEPATIC FUNCTION PANEL
Alkaline Phosphatase: 87 U/L (ref 39–117)
Bilirubin, Direct: 0 mg/dL (ref 0.0–0.3)
Total Bilirubin: 0.4 mg/dL (ref 0.3–1.2)
Total Protein: 6.7 g/dL (ref 6.0–8.3)

## 2011-11-09 LAB — POCT INR: INR: 3.1

## 2011-11-11 NOTE — Progress Notes (Signed)
Quick Note:  Called and spoke with patient regarding results as listed below per RB. Patient verbalized understanding and nothing further needed at this time. ______

## 2011-11-18 ENCOUNTER — Ambulatory Visit: Payer: Medicare Other | Admitting: Emergency Medicine

## 2011-12-09 ENCOUNTER — Encounter: Payer: Self-pay | Admitting: Internal Medicine

## 2011-12-09 ENCOUNTER — Encounter: Payer: Self-pay | Admitting: Obstetrics & Gynecology

## 2011-12-09 ENCOUNTER — Other Ambulatory Visit: Payer: Self-pay | Admitting: Internal Medicine

## 2011-12-09 ENCOUNTER — Ambulatory Visit (INDEPENDENT_AMBULATORY_CARE_PROVIDER_SITE_OTHER): Payer: Medicare Other | Admitting: Internal Medicine

## 2011-12-09 VITALS — BP 162/87 | HR 82 | Temp 98.1°F | Ht 64.0 in | Wt 214.2 lb

## 2011-12-09 DIAGNOSIS — M545 Low back pain: Secondary | ICD-10-CM

## 2011-12-09 DIAGNOSIS — N951 Menopausal and female climacteric states: Secondary | ICD-10-CM

## 2011-12-09 DIAGNOSIS — N898 Other specified noninflammatory disorders of vagina: Secondary | ICD-10-CM

## 2011-12-09 DIAGNOSIS — M810 Age-related osteoporosis without current pathological fracture: Secondary | ICD-10-CM

## 2011-12-09 DIAGNOSIS — Z23 Encounter for immunization: Secondary | ICD-10-CM

## 2011-12-09 DIAGNOSIS — E7800 Pure hypercholesterolemia, unspecified: Secondary | ICD-10-CM

## 2011-12-09 DIAGNOSIS — E119 Type 2 diabetes mellitus without complications: Secondary | ICD-10-CM

## 2011-12-09 DIAGNOSIS — M349 Systemic sclerosis, unspecified: Secondary | ICD-10-CM

## 2011-12-09 DIAGNOSIS — Z0000 Encounter for general adult medical examination without abnormal findings: Secondary | ICD-10-CM

## 2011-12-09 DIAGNOSIS — I10 Essential (primary) hypertension: Secondary | ICD-10-CM

## 2011-12-09 DIAGNOSIS — Z7901 Long term (current) use of anticoagulants: Secondary | ICD-10-CM

## 2011-12-09 DIAGNOSIS — D684 Acquired coagulation factor deficiency: Secondary | ICD-10-CM

## 2011-12-09 DIAGNOSIS — I2789 Other specified pulmonary heart diseases: Secondary | ICD-10-CM

## 2011-12-09 DIAGNOSIS — E559 Vitamin D deficiency, unspecified: Secondary | ICD-10-CM

## 2011-12-09 LAB — CBC WITH DIFFERENTIAL/PLATELET
HCT: 35.4 % — ABNORMAL LOW (ref 36.0–46.0)
Hemoglobin: 12.2 g/dL (ref 12.0–15.0)
Lymphocytes Relative: 39 % (ref 12–46)
MCHC: 34.5 g/dL (ref 30.0–36.0)
MCV: 86.6 fL (ref 78.0–100.0)
Monocytes Absolute: 0.7 10*3/uL (ref 0.1–1.0)
Monocytes Relative: 11 % (ref 3–12)
Neutro Abs: 3.1 10*3/uL (ref 1.7–7.7)

## 2011-12-09 LAB — GLUCOSE, CAPILLARY: Glucose-Capillary: 179 mg/dL — ABNORMAL HIGH (ref 70–99)

## 2011-12-09 MED ORDER — FLUOXETINE HCL 20 MG PO CAPS: 20.0000 mg | ORAL_CAPSULE | Freq: Every day | ORAL | Status: DC

## 2011-12-09 MED ORDER — GABAPENTIN 300 MG PO CAPS: 300.0000 mg | ORAL_CAPSULE | Freq: Two times a day (BID) | ORAL | Status: DC

## 2011-12-09 MED ORDER — HYDROCODONE-ACETAMINOPHEN 7.5-750 MG PO TABS: 1.0000 | ORAL_TABLET | ORAL | Status: DC | PRN

## 2011-12-09 MED ORDER — PREDNISONE 5 MG PO TABS: 5.0000 mg | ORAL_TABLET | Freq: Every day | ORAL | Status: DC

## 2011-12-09 MED ORDER — GABAPENTIN 100 MG PO CAPS: 300.0000 mg | ORAL_CAPSULE | Freq: Two times a day (BID) | ORAL | Status: DC

## 2011-12-09 MED ORDER — ZOSTER VACCINE LIVE 19400 UNT/0.65ML ~~LOC~~ SOLR: 0.6500 mL | Freq: Once | SUBCUTANEOUS | Status: DC

## 2011-12-09 MED ORDER — METFORMIN HCL 500 MG PO TABS: 500.0000 mg | ORAL_TABLET | Freq: Two times a day (BID) | ORAL | Status: DC

## 2011-12-09 MED ORDER — LISINOPRIL-HYDROCHLOROTHIAZIDE 20-25 MG PO TABS: 1.0000 | ORAL_TABLET | Freq: Every day | ORAL | Status: DC

## 2011-12-09 NOTE — Patient Instructions (Signed)
Please stop taking Digoxin, premarin and Lyrica. Please decrease the dose of Prednisone to 5 mg once a day for next 2 weeks and 2.5 mg (half a tablet ) once a day for the continuing 2 weeks and stop taking it after that. Watch what you eat. You do have Diabetes for which metformin has been started. We will change the Lipid medication depending on what your lipid values today. Stop finofibrate till that time. Measure your BP at home over the next week and call us back with the results. Its high this visit. Also take your medications regularly. Call us if you need refills. Diabetes Meal Planning Guide The diabetes meal planning guide is a tool to help you plan your meals and snacks. It is important for people with diabetes to manage their blood glucose (sugar) levels. Choosing the right foods and the right amounts throughout your day will help control your blood glucose. Eating right can even help you improve your blood pressure and reach or maintain a healthy weight. CARBOHYDRATE COUNTING MADE EASY When you eat carbohydrates, they turn to sugar. This raises your blood glucose level. Counting carbohydrates can help you control this level so you feel better. When you plan your meals by counting carbohydrates, you can have more flexibility in what you eat and balance your medicine with your food intake. Carbohydrate counting simply means adding up the total amount of carbohydrate grams in your meals and snacks. Try to eat about the same amount at each meal. Foods with carbohydrates are listed below. Each portion below is 1 carbohydrate serving or 15 grams of carbohydrates. Ask your dietician how many grams of carbohydrates you should eat at each meal or snack. Grains and Starches  1 slice bread.    English muffin or hotdog/hamburger bun.    cup cold cereal (unsweetened).   ? cup cooked pasta or rice.    cup starchy vegetables (corn, potatoes, peas, beans, winter squash).   1 tortilla (6 inches).     bagel.   1 waffle or pancake (size of a CD).    cup cooked cereal.   4 to 6 small crackers.  *Whole grain is recommended. Fruit  1 cup fresh unsweetened berries, melon, papaya, pineapple.   1 small fresh fruit.    banana or mango.    cup fruit juice (4 oz unsweetened).    cup canned fruit in natural juice or water.   2 tbs dried fruit.   12 to 15 grapes or cherries.  Milk and Yogurt  1 cup fat-free or 1% milk.   1 cup soy milk.   6 oz light yogurt with sugar-free sweetener.   6 oz low-fat soy yogurt.   6 oz plain yogurt.  Vegetables  1 cup raw or  cup cooked is counted as 0 carbohydrates or a "free" food.   If you eat 3 or more servings at 1 meal, count them as 1 carbohydrate serving.  Other Carbohydrates   oz chips or pretzels.    cup ice cream or frozen yogurt.    cup sherbet or sorbet.   2 inch square cake, no frosting.   1 tbs honey, sugar, jam, jelly, or syrup.   2 small cookies.   3 squares of graham crackers.   3 cups popcorn.   6 crackers.   1 cup broth-based soup.   Count 1 cup casserole or other mixed foods as 2 carbohydrate servings.   Foods with less than 20 calories in a serving may  be counted as 0 carbohydrates or a "free" food.  You may want to purchase a book or computer software that lists the carbohydrate gram counts of different foods. In addition, the nutrition facts panel on the labels of the foods you eat are a good source of this information. The label will tell you how big the serving size is and the total number of carbohydrate grams you will be eating per serving. Divide this number by 15 to obtain the number of carbohydrate servings in a portion. Remember, 1 carbohydrate serving equals 15 grams of carbohydrate. SERVING SIZES Measuring foods and serving sizes helps you make sure you are getting the right amount of food. The list below tells how big or small some common serving sizes are.  1 oz.........4  stacked dice.   3 oz........Marland KitchenDeck of cards.   1 tsp.......Marland KitchenTip of little finger.   1 tbs......Marland KitchenMarland KitchenThumb.   2 tbs.......Marland KitchenGolf ball.    cup......Marland KitchenHalf of a fist.   1 cup.......Marland KitchenA fist.  SAMPLE DIABETES MEAL PLAN Below is a sample meal plan that includes foods from the grain and starches, dairy, vegetable, fruit, and meat groups. A dietician can individualize a meal plan to fit your calorie needs and tell you the number of servings needed from each food group. However, controlling the total amount of carbohydrates in your meal or snack is more important than making sure you include all of the food groups at every meal. You may interchange carbohydrate containing foods (dairy, starches, and fruits). The meal plan below is an example of a 2000 calorie diet using carbohydrate counting. This meal plan has 17 carbohydrate servings. Breakfast  1 cup oatmeal (2 carb servings).    cup light yogurt (1 carb serving).   1 cup blueberries (1 carb serving).    cup almonds.  Snack  1 large apple (2 carb servings).   1 low-fat string cheese stick.  Lunch  Chicken breast salad.   1 cup spinach.    cup chopped tomatoes.   2 oz chicken breast, sliced.   2 tbs low-fat [place] dressing.   12 whole-wheat crackers (2 carb servings).   12 to 15 grapes (1 carb serving).   1 cup low-fat milk (1 carb serving).  Snack  1 cup carrots.    cup hummus (1 carb serving).  Dinner  3 oz broiled salmon.   1 cup brown rice (3 carb servings).  Snack  1  cups steamed broccoli (1 carb serving) drizzled with 1 tsp olive oil and lemon juice.   1 cup light pudding (2 carb servings).  DIABETES MEAL PLANNING WORKSHEET Your dietician can use this worksheet to help you decide how many servings of foods and what types of foods are right for you.  BREAKFAST Food Group and Servings / Carb Servings Grain/Starches __________________________________ Dairy  __________________________________________ Vegetable ______________________________________ Fruit ___________________________________________ Meat __________________________________________ Fat ____________________________________________ LUNCH Food Group and Servings / Carb Servings Grain/Starches ___________________________________ Dairy ___________________________________________ Fruit ____________________________________________ Meat ___________________________________________ Fat _____________________________________________ W[employer] Food Group and Servings / Carb Servings Grain/Starches ___________________________________ Dairy ___________________________________________ Fruit ____________________________________________ Meat ___________________________________________ Fat _____________________________________________ SNACKS Food Group and Servings / Carb Servings Grain/Starches ___________________________________ Dairy ___________________________________________ Vegetable _______________________________________ Fruit ____________________________________________ Meat ___________________________________________ Fat _____________________________________________ DAILY TOTALS Starches _________________________ Vegetable ________________________ Fruit ____________________________ Dairy ____________________________ Meat ____________________________ Fat ______________________________ Document Released: 11/26/2004 Document Revised: 02/18/2011 Document Reviewed: 10/07/2008 [employer] Patient Information 2012 [place], [place].Scleroderma Scleroderma is a continual (chronic) disease that keeps getting worse (progressive). It is a disease of the connective tissue. It is generally listed  as one of the arthritic or rheumatic diseases. It is a condition leading to hardening and thickening of the skin and connective tissues. This disease is associated with over-activation of the system which  usually protects the body against cancers and invading infections (immune system). This causes damage to cells that line small blood vessels, which in turn leads to the over-production of scar tissue. Scleroderma means "hard skin." It is also known as systemic sclerosis when it also affects other organs in the body. The disease process is caused by the overproduction of collagen. Collagen is a connective tissue. The cause of this overproduction is not known. It affects women four times more frequently than men. It affects adults more than children. In some cases it seems to run in families. It has been caused by toxins and is seen following bone marrow transplants. It cannot be passed to another person (not contagious). It varies from mild to life threatening. It can affect almost all organs of the body. It is a disease in which the symptoms may either be: Visible, as when the skin is affected.  Invisible, as when only internal organs are involved.  THERE ARE DIFFERENT KINDS OF SCLERODERMA:  Localized Scleroderma More common in children.  Usually found in only a few places on the skin or in the muscles.  Rarely, if ever, does localized scleroderma develop into the systemic form of the disease.  Systemic Sclerosis Skin.  Tube which carries food from your mouth to your stomach (esophagus).  Gastrointestinal tract.  Lungs.  Kidneys.  Heart.  Other internal organs.  Blood vessels.  Muscles.  Joints (causing arthritis).  May include one or more of the following: Raynaud's Phenomenon (abnormal sensitivity to cold in thearms/hands and legs/feet). This cold sensitivity causes the fingers to become white or blue or red and often painful. This can also be brought on by stress or change in temperature.  Swelling of the hands and feet.  Pain and stiffness of the joints.  Thickening and discoloration of the skin.  Joint contractures, permanent shortening of muscle tissue.  Digestive system and  gastrointestinal tract problems which lead to:  Difficulty swallowing and absorbing the food.  Acid reflux.  Heartburn.  Bloating.  Diarrhea.  Constipation.  Sjogren's Syndrome, dry mucus membranes leading to dry eyes and dry mouth.  Oral, facial, and dental problems.  Kidney, heart, and lung involvement leading to difficulty breathing and high blood pressure.  Nonspecific symptoms such as extreme fatigue, generalized weakness, weight loss, sleep disturbances, and vague aching of muscles, joints and bones.  Less commonly, you may get breathlessness, upset stomach, sore eyes, and depression.  DIAGNOSIS  The diagnosis may require seeing: An arthritis specialists (rheumatologists).  A skin specialists (dermatologists).  Diagnosis of scleroderma is based on clinical history and physical findings. Diagnosis may be delayed in those without significant skin thickening. Laboratory, X-ray and lung (pulmonary) function tests determine the extent and severity of internal organ involvement. Diagnosis may also require blood studies and a variety of specialized tests depending on which organs are affected. Diagnosis may be very difficult, particularly in the early stages before there are significant skin changes. This difficulty is because many of the symptoms are common to, or may overlap with those of other diseases. Symptoms may overlap with other connective tissue diseases such as: Rheumatoid arthritis.  Lupus.  Polymyositis.  TREATMENT  The treatment program varies depending on the type and severity of the symptoms.  There is no known cure for scleroderma. No  treatment has been scientifically proven to change the overall course of the disease, although d-penicillamine is commonly used for this purpose and may be of some value.  To help soften the skin, light therapy, topical corticosteroids, calcipotriol (made from Vitamin D) and methotraxate are used. These medications may be used in combination as  well as alone.  There are a number of effective organ-specific treatments for scleroderma. These treat the symptoms of the disease but do not treat the disease itself.  Calcium channel blockers may help Raynaud's phenomenon. To help prevent Raynaud's phenomenon or keep it from getting worse avoid:  Cold.  Stress.  Nicotine.  Caffeine.  Decongestant medications may help.  Declining kidney (renal) function and high blood pressure (hypertension) may be treated with drugs.  Esophageal damage from reflux of stomach contents can be treated with acid-reducing drugs.  Antibiotics, special diets and medication can improve absorption of nutrients in people who have abnormalities of their intestines.  Musculoskeletal pain may respond to nonsteroidal anti-inflammatory agents.  Lung inflammation may be treated with cyclophosphamide.  Your caregiver will help decide with you what treatment will be best for you. Document Released: 05/22/2002 Document Revised: 02/18/2011 Document Reviewed: 01/18/2008 [facility] Patient Information 2012 [place].Diabetes and Exercise Regular exercise is important and can help:   Control blood glucose (sugar).   Decrease blood pressure.    Control blood lipids (cholesterol, triglycerides).   Improve overall health.  BENEFITS FROM EXERCISE  Improved fitness.   Improved flexibility.   Improved endurance.   Increased bone density.   Weight control.   Increased muscle strength.   Decreased body fat.   Improvement of the body's use of insulin, a hormone.   Increased insulin sensitivity.   Reduction of insulin needs.   Reduced stress and tension.   Helps you feel better.  People with diabetes who add exercise to their lifestyle gain additional benefits, including:  Weight loss.   Reduced appetite.   Improvement of the body's use of blood glucose.   Decreased risk factors for heart disease:   Lowering of cholesterol and triglycerides.    Raising the level of good cholesterol (high-density lipoproteins, HDL).   Lowering blood sugar.   Decreased blood pressure.  TYPE 1 DIABETES AND EXERCISE  Exercise will usually lower your blood glucose.   If blood glucose is greater than 240 mg/dl, check urine ketones. If ketones are present, do not exercise.   Location of the insulin injection sites may need to be adjusted with exercise. Avoid injecting insulin into areas of the body that will be exercised. For example, avoid injecting insulin into:   The arms when playing tennis.   The legs when jogging. For more information, discuss this with your caregiver.   Keep a record of:   Food intake.   Type and amount of exercise.   Expected peak times of insulin action.   Blood glucose levels.  Do this before, during, and after exercise. Review your records with your caregiver. This will help you to develop guidelines for adjusting food intake and insulin amounts.  TYPE 2 DIABETES AND EXERCISE  Regular physical activity can help control blood glucose.   Exercise is important because it may:   Increase the body's sensitivity to insulin.   Improve blood glucose control.   Exercise reduces the risk of heart disease. It decreases serum cholesterol and triglycerides. It also lowers blood pressure.   Those who take insulin or oral hypoglycemic agents should watch for signs of hypoglycemia. These  signs include dizziness, shaking, sweating, chills, and confusion.   Body water is lost during exercise. It must be replaced. This will help to avoid loss of body fluids (dehydration) or heat stroke.  Be sure to talk to your caregiver before starting an exercise program to make sure it is safe for you. Remember, any activity is better than none.  Document Released: 05/22/2003 Document Revised: 02/18/2011 Document Reviewed: 09/05/2008 [facility] Patient Information 2012 [place].

## 2011-12-09 NOTE — Progress Notes (Signed)
Subjective:     Patient ID: [name], female   DOB: 27-Mar-1950, 61 y.o.   MRN: [id number]  HPI: 61 year old [place] American female is here for establishing care. [facility] She is slow in answering questions and attributes decrease in memory to the episode of hypoglycemia she was admitted in the hospital for last year. She has been taking her BP medication once a week since she had no refills after [facility]. She has been on premarin for years for hot flushes. She states that she was seen by a gynecologist last year for bleeding PV ( she has had a partial hysterectomy long time ago ) and still complains of foul discharge per vagina today. She says she ate a sweet last night. She states that she has been on prednisone for a long time for her scleroderma.    Review of Systems  Constitutional: Positive for fatigue.  HENT: Negative.   Eyes: Negative.   Respiratory: Positive for shortness of breath. Negative for wheezing.   Cardiovascular: Positive for leg swelling.  Gastrointestinal:       Dyspepsia  Genitourinary: Positive for vaginal discharge.  Musculoskeletal: Positive for back pain.  Neurological: Negative.   Hematological: Negative.   Psychiatric/Behavioral: Negative.        Objective:   Physical Exam  Constitutional: She is oriented to person, place, and time. She appears well-developed and well-nourished.       Central obesity  HENT:  Head: Normocephalic and atraumatic.  Eyes: Conjunctivae normal are normal. Pupils are equal, round, and reactive to light.  Neck: Normal range of motion.  Cardiovascular: Normal rate, regular rhythm and normal heart sounds.   Pulmonary/Chest: Effort normal and breath sounds normal.  Abdominal: Soft.  Musculoskeletal: She exhibits edema.  Neurological: She is alert and oriented to person, place, and time.  Skin:       Pepper pigmentation in extremities.slighlty mask facies  with pinched nose and thin lips. Skin is soft and pinchable  Psychiatric: She has a normal mood and affect.       Assessment:    DM type 2  scleroderma HTN Long term prednisone use Long term premarin use/hot flushes gerd with megs esophagus Pulm HTN Long term warfarin use Health maintenance dyslipidemia    Plan:       Scleroderma : Patients skin disease is not active currently. 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 She does not need to be on the Prednisone. We will taper her off it gradually.\  GI : GERD, dyspepsia, Mega esophagus. Nexium and Reglan are controlling her symptoms currently.  HTN : BP not well controlled since non compliant with her medication since she ran out of the same. WE will refill and re asess.  DM type 2: probably steroid induced. Metformin started and counseled.  Hot FLushes: D/C premarin start on SSRI for the same. RIsk outweighs benefit for premarin use.  Pulm HTN : being followed by Pulmonology.On bosentan for the same. She endorses no increase in baseline dyspnea.  Long term Anticoagulation: per documentation secondary to acquired Clotting disorder.COntinuing the same.  Cardiology : not sure of indication for Digoxin. She doe snot have systolic failure. She has diastolic dysfunction. No documentation of A fib. 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  Health maintenance:  Due for a battery of measures which were reviewed and ordered today.

## 2011-12-09 NOTE — Addendum Note (Signed)
Addended by: Orson Gear on: 12/09/2011 05:11 PM   Modules accepted: Orders

## 2011-12-10 LAB — LIPID PANEL: Cholesterol: 199 mg/dL (ref 0–200)

## 2011-12-10 LAB — COMPLETE METABOLIC PANEL WITH GFR
Albumin: 3.9 g/dL (ref 3.5–5.2)
BUN: 13 mg/dL (ref 6–23)
Calcium: 8.9 mg/dL (ref 8.4–10.5)
Chloride: 104 mEq/L (ref 96–112)
GFR, Est African American: >89 mL/min
GFR, Est Non African American: >89 mL/min
Glucose, Bld: 150 mg/dL — ABNORMAL HIGH (ref 70–99)
Potassium: 4.3 mEq/L (ref 3.5–5.3)

## 2011-12-13 ENCOUNTER — Ambulatory Visit (INDEPENDENT_AMBULATORY_CARE_PROVIDER_SITE_OTHER): Payer: Medicare Other | Admitting: Emergency Medicine

## 2011-12-13 ENCOUNTER — Encounter: Payer: Self-pay | Admitting: Emergency Medicine

## 2011-12-13 VITALS — BP 140/72 | HR 94 | Temp 98.0°F | Ht 64.0 in | Wt 213.2 lb

## 2011-12-13 DIAGNOSIS — I2789 Other specified pulmonary heart diseases: Secondary | ICD-10-CM

## 2011-12-13 NOTE — Assessment & Plan Note (Signed)
Discussed her PAP's, R heart issues in detail. Recommended restart Tyvaso, evan at decreased freq (tid). She is unconvinced, still wants to put it off. I am concerned that her R heart fxn will continue to decline.  - repeat TTE in 6/14 to compare - continue coumadin and bosentan (LFT 9/26 normal) - rov 08/2012 to discuss the TTE

## 2011-12-13 NOTE — Progress Notes (Signed)
[name] is a 61 year old woman with scleroderma and severe secondary pulmonary hypertension. She had been treated with bosentan + Coumadin. Initiated [name], [name] in the past but unable to tolerate. Finally started on inhaled [name] in 2010. Some delay in titrating up to goal dosing of 9 puffs 4x a day due to compliance issues, but finally able to do so. This was then stopped in Summer 2012 during admission for septic knee hardware.   By TTE, her PASP improved from 102 mmHg (7/09) to 39mHg (9/10), 6 minute walk improved to 3228m11/2010).   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 That hospitalization ultimately resulted in ICU stay for sepsis, c/b renal insufficiency. TTE on June 29 showed estimated RVSP of 63 mmHg (off [name]). We sent her home on bosentan + coumadin, with plans to restart [name] as outpt once she stabilized. She went home on IV Abx.   Readmitted 09/2010 with fever and leukocytosis, S Cr 1.8 - 2.0. Pulm/CCM consulted to assist w her management of PAH medications.  ROV 10/29/10 -- scleroderma, PAH. Has been hospitalized x3 since our last visit due to infected knee hardware. She is off [name]. She is planning to go back for repeat L knee surgery this month. She states that her breathing has been going well. She is on Tracleer. She does not want to restart the [name] at this time. Denies dyspnea, syncope, CP.  She is finished with IV abx.   ROV 02/01/11 -- scleroderma, PAH. Remains on coumadin + bosentan but off [name] as above. Last TTE on June 29 showed estimated RVSP of 63 mmHg (off [name]).  She returns for f/u. Tells me that since last visit she underwent f/u knee sgy to replace hardware. She feels it is doing well, now off abx. She is not interested in going back on [name]. Her breathing  seems to be stable, although she isn't really pushing herself.   ROV 06/21/11 -- scleroderma, PAH. Remains on coumadin + bosentan but off [name]. She clinically feels well, has been able to increase her activity but PAP's have increased by TTE 09/11/10. We have planned for repeat TTE in June 2013. Remains on Pred 84m884md for her scleroderma. She asks today about whether a FXa inhibitor would be possible for her, but I don't know of any [facility]. Due for LFT's, wants to get them at coumadin clinic if possible, she will call to arrange.   ROV 10/04/11 -- scleroderma, PAH. Remains on coumadin + bosentan but off [name]. TTE on 08/18/11 shows PASP rising, now 884m50m(from 63 08/2010; her best was 42 in 11/2008). Clinically, she feels well - no breathing complaints. Her wt has been stable, trace edema without change. She ambulates and does her usual activities, denies dyspnea.   ROV 12/13/11 -- scleroderma, PAH. Remains on coumadin + bosentan but off [name]. TTE on 08/18/11 shows PASP rising, now 884mm584mfrom 63 08/2010; her best was 42 in 11/2008). Returns today to discuss [name] - She is still against doing it due to the fact that it is time consuming, difficult to do 4-6 times a day. She isn't sure that it helps her breathing. Her TTE showed moderate RV dilation 6/13. Denies SOB, edema or CP.    Exam Filed Vitals:  12/13/11 1001  BP: 140/72  Pulse: 94  Temp: 98 F (36.7 C)   Gen: Pleasant, well-nourished, in no distress,    ENT: No lesions,  mouth clear,  oropharynx clear, no postnasal drip  Neck: No JVD, no TMG, no carotid bruits  Lungs: No use of accessory muscles, no dullness to percussion, clear without rales or rhonchi  Cardiovascular: RRR, heart sounds normal, no murmur or gallops, no peripheral edema  Musculoskeletal: No deformities, L knee pain, good ROM, trace pretibial edema.   Neuro: alert, non focal  Skin: Warm, no lesions or rashes   TTE 08/18/11:  - Left  ventricle: The cavity size was normal. Wall thickness was normal. Systolic function was normal. The estimated ejection fraction was in the range of 55% to 60%. - Aortic valve: Mild regurgitation. - Mitral valve: Mild regurgitation. - Left atrium: The atrium was mildly dilated. - Right ventricle: The cavity size was moderately dilated. Systolic function was mildly reduced. - Right atrium: The atrium was mildly dilated. - Tricuspid valve: Moderate regurgitation. - Pulmonary arteries: Systolic pressure was severely increased. PA peak pressure: 20m Hg (S).  Impression:  PULMONARY HYPERTENSION, SECONDARY Discussed her PAP's, R heart issues in detail. Recommended restart [name], evan at decreased freq (tid). She is unconvinced, still wants to put it off. I am concerned that her R heart fxn will continue to decline.  - repeat TTE in 6/14 to compare - continue coumadin and bosentan (LFT 9/26 normal) - rov 08/2012 to discuss the TTE

## 2011-12-13 NOTE — Patient Instructions (Addendum)
Continue your bosentan and you coumadin as you are taking them We discussed possibly restarting Tyvaso. Per our discussions we will not restart for now.  We will repeat your echocardiogram in June 2014 Follow up with Dr Lamonte Sakai in June 2014 or sooner if you have any problems.

## 2011-12-14 ENCOUNTER — Other Ambulatory Visit: Payer: Self-pay | Admitting: Internal Medicine

## 2011-12-14 ENCOUNTER — Telehealth: Payer: Self-pay | Admitting: Internal Medicine

## 2011-12-14 DIAGNOSIS — E785 Hyperlipidemia, unspecified: Secondary | ICD-10-CM

## 2011-12-14 LAB — LDL CHOLESTEROL, DIRECT: Direct LDL: 62 mg/dL

## 2011-12-14 NOTE — Telephone Encounter (Signed)
Message copied by Kemper Durie on Tue Dec 14, 2011 12:39 PM ------      Message from: Truddie Crumble      Created: Fri Dec 10, 2011  9:32 AM      Regarding: LDL did not calculate       Dr. Margot Ables,            The LDL did not calculate for Doralee Albino, [id number].            Would you like to add a Direct LDL?            If so, please Create an Addendum to yesterday's office visit and place the order.  I can then retrieve the add on request and complete the order.              You may notify me bu In Basket or phone [phone].            Thank you,             [facility]      [phone]

## 2011-12-14 NOTE — Addendum Note (Signed)
Addended byKemper Durie on: 12/14/2011 02:49 PM   Modules accepted: Orders

## 2011-12-15 ENCOUNTER — Other Ambulatory Visit: Payer: Self-pay | Admitting: Emergency Medicine

## 2011-12-16 ENCOUNTER — Other Ambulatory Visit: Payer: Self-pay | Admitting: *Deleted

## 2011-12-16 MED ORDER — BOSENTAN 125 MG PO TABS: 125.0000 mg | ORAL_TABLET | Freq: Two times a day (BID) | ORAL | Status: DC

## 2011-12-20 ENCOUNTER — Telehealth: Payer: Self-pay | Admitting: *Deleted

## 2011-12-20 NOTE — Telephone Encounter (Signed)
Pt called stating Neurontin 300 mg is to strong.  It makes her sick on her stomach and gives her  diarrhea. She was on neurontin 100 mg twice a day in the past. She does not notice a change in the pain with the larger dose.  Pt last seen on 9/26 and Neurontin increased.  Pt has stopped taking the 300 mg tables and symptoms have gotten better.  She is asking to stay on the Neurontin 100 mg tabs twice a day.  She needs a new Rx called in.

## 2011-12-22 ENCOUNTER — Other Ambulatory Visit: Payer: Self-pay | Admitting: Internal Medicine

## 2011-12-22 MED ORDER — GABAPENTIN 100 MG PO CAPS: 100.0000 mg | ORAL_CAPSULE | Freq: Two times a day (BID) | ORAL | Status: DC

## 2011-12-22 MED ORDER — PRAVASTATIN SODIUM 80 MG PO TABS: 80.0000 mg | ORAL_TABLET | Freq: Every evening | ORAL | Status: DC

## 2011-12-24 ENCOUNTER — Other Ambulatory Visit: Payer: Self-pay | Admitting: *Deleted

## 2011-12-24 MED ORDER — METOCLOPRAMIDE HCL 10 MG PO TABS: ORAL_TABLET | ORAL | Status: DC

## 2011-12-24 NOTE — Telephone Encounter (Signed)
Med d'cd

## 2011-12-30 ENCOUNTER — Encounter: Payer: Medicare Other | Admitting: Obstetrics & Gynecology

## 2011-12-31 NOTE — Telephone Encounter (Signed)
meds refilled

## 2012-01-04 ENCOUNTER — Ambulatory Visit (INDEPENDENT_AMBULATORY_CARE_PROVIDER_SITE_OTHER): Payer: Medicare Other | Admitting: Obstetrics and Gynecology

## 2012-01-04 ENCOUNTER — Encounter: Payer: Self-pay | Admitting: Obstetrics and Gynecology

## 2012-01-04 VITALS — BP 120/80 | Ht 65.0 in | Wt 208.0 lb

## 2012-01-04 DIAGNOSIS — Z719 Counseling, unspecified: Secondary | ICD-10-CM

## 2012-01-04 DIAGNOSIS — A599 Trichomoniasis, unspecified: Secondary | ICD-10-CM

## 2012-01-04 DIAGNOSIS — N926 Irregular menstruation, unspecified: Secondary | ICD-10-CM

## 2012-01-04 LAB — POCT WET PREP (WET MOUNT): Clue Cells Wet Prep Whiff POC: NEGATIVE

## 2012-01-04 MED ORDER — METRONIDAZOLE 500 MG PO TABS: 2000.0000 mg | ORAL_TABLET | Freq: Once | ORAL | Status: AC

## 2012-01-04 NOTE — Progress Notes (Signed)
When did bleeding start: august How  Long: 2 months How often changing pad/tampon: twice a day Bleeding Disorders: yes Cramping: no Contraception: no Fibroids: no Hormone Therapy: no New Medications: no Menopausal Symptoms: no Vag. Discharge: yes Abdominal Pain: no Increased Stress: no pt was treated for trichomonas 11/12 and had a TOC.  She denies having any vaginal IC.  She denies pelvic pain BP 120/80  Ht 5' 5" (1.651 m)  Wt 208 lb (94.348 kg)  BMI 34.61 kg/m2 Physical Examination: General appearance - alert, well appearing, and in no distress Heart - normal rate and regular rhythm Abdomen - soft, nontender, nondistended, no masses or organomegaly Pelvic - normal external genitalia, vulva, vagina, cervix, uterus and adnexa, VULVA: normal appearing vulva with no masses, tenderness or lesions, VAGINA: atrophic, vaginal erythema with a frothy discharge, vaginal lesion granulomas at the cuff, vaginal discharge - bloody, frothy and vaginal erythema noted, WET MOUNT done - results: trichomonads, , WET MOUNT done - results: trichomonads.  Thickening on right side of vagina Extremities - peripheral pulses normal, no pedal edema, no clubbing or cyanosis Trichomonas Thickening on right side of vagina Flagyl given RT for [place] and bx of the area. Pt had a benign bx of the cuff last yr

## 2012-01-04 NOTE — Patient Instructions (Signed)
Trichomoniasis Trichomoniasis is an infection, caused by the Trichomonas organism, that affects both women and men. In women, the outer female genitalia and the vagina are affected. In men, the penis is mainly affected, but the prostate and other reproductive organs can also be involved. Trichomoniasis is a sexually transmitted disease (STD) and is most often passed to another person through sexual contact. The majority of people who get trichomoniasis do so from a sexual encounter and are also at risk for other STDs. CAUSES   Sexual intercourse with an infected partner.  It can be present in swimming pools or hot tubs. SYMPTOMS   Abnormal gray-green frothy vaginal discharge in women.  Vaginal itching and irritation in women.  Itching and irritation of the area outside the vagina in women.  Penile discharge with or without pain in males.  Inflammation of the urethra (urethritis), causing painful urination.  Bleeding after sexual intercourse. RELATED COMPLICATIONS  Pelvic inflammatory disease.  Infection of the uterus (endometritis).  Infertility.  Tubal (ectopic) pregnancy.  It can be associated with other STDs, including gonorrhea and chlamydia, hepatitis B, and HIV. COMPLICATIONS DURING PREGNANCY  Early (premature) delivery.  Premature rupture of the membranes (PROM).  Low birth weight. DIAGNOSIS   Visualization of Trichomonas under the microscope from the vagina discharge.  Ph of the vagina greater than 4.5, tested with a test tape.  Trich Rapid Test.  Culture of the organism, but this is not usually needed.  It may be found on a Pap test.  Having a "strawberry cervix,"which means the cervix looks very red like a strawberry. TREATMENT   You may be given medication to fight the infection. Inform your caregiver if you could be or are pregnant. Some medications used to treat the infection should not be taken during pregnancy.  Over-the-counter medications or  creams to decrease itching or irritation may be recommended.  Your sexual partner will need to be treated if infected. HOME CARE INSTRUCTIONS   Take all medication prescribed by your caregiver.  Take over-the-counter medication for itching or irritation as directed by your caregiver.  Do not have sexual intercourse while you have the infection.  Do not douche or wear tampons.  Discuss your infection with your partner, as your partner may have acquired the infection from you. Or, your partner may have been the person who transmitted the infection to you.  Have your sex partner examined and treated if necessary.  Practice safe, informed, and protected sex.  See your caregiver for other STD testing. SEEK MEDICAL CARE IF:   You still have symptoms after you finish the medication.  You have an oral temperature above 102 F (38.9 C).  You develop belly (abdominal) pain.  You have pain when you urinate.  You have bleeding after sexual intercourse.  You develop a rash.  The medication makes you sick or makes you throw up (vomit). Document Released: 08/25/2000 Document Revised: 05/24/2011 Document Reviewed: 09/20/2008 [facility] Patient Information 2013 [place].

## 2012-01-12 ENCOUNTER — Other Ambulatory Visit: Payer: Self-pay | Admitting: Internal Medicine

## 2012-01-21 ENCOUNTER — Encounter: Payer: Medicare Other | Admitting: Obstetrics & Gynecology

## 2012-02-01 ENCOUNTER — Ambulatory Visit (INDEPENDENT_AMBULATORY_CARE_PROVIDER_SITE_OTHER): Payer: Medicare Other | Admitting: Obstetrics and Gynecology

## 2012-02-01 ENCOUNTER — Ambulatory Visit (INDEPENDENT_AMBULATORY_CARE_PROVIDER_SITE_OTHER): Payer: Medicare Other

## 2012-02-01 ENCOUNTER — Other Ambulatory Visit: Payer: Self-pay | Admitting: Obstetrics and Gynecology

## 2012-02-01 ENCOUNTER — Encounter: Payer: Self-pay | Admitting: Obstetrics and Gynecology

## 2012-02-01 VITALS — BP 110/60 | Wt 209.0 lb

## 2012-02-01 DIAGNOSIS — A599 Trichomoniasis, unspecified: Secondary | ICD-10-CM

## 2012-02-01 DIAGNOSIS — N926 Irregular menstruation, unspecified: Secondary | ICD-10-CM

## 2012-02-01 NOTE — Progress Notes (Signed)
F/u u/s BP 110/60  Wt 209 lb (94.802 kg) Pelvic US normal osom for trich WNL RT in 2 months for aex

## 2012-02-03 ENCOUNTER — Telehealth: Payer: Self-pay | Admitting: Obstetrics and Gynecology

## 2012-02-03 ENCOUNTER — Encounter: Payer: Medicare Other | Admitting: Obstetrics & Gynecology

## 2012-02-03 NOTE — Telephone Encounter (Signed)
Try calling pt rgd msg no answer unable to leave msg

## 2012-02-04 ENCOUNTER — Encounter: Payer: Medicare Other | Admitting: Obstetrics & Gynecology

## 2012-03-14 ENCOUNTER — Ambulatory Visit: Payer: Medicare Other | Admitting: Obstetrics and Gynecology

## 2012-03-21 IMAGING — CR DG KNEE 1-2V*L*
2 series · 2 of 2 positions shown · non-contrast
Comparison: Post-operative left knee x-rays 09/18/2010.

CLINICAL DATA: Possible septic arthritis in the left knee.  Prior
left knee arthroplasty with revision.

LEFT KNEE - 1-2 VIEW 10/04/2010:

[x knee ap left]
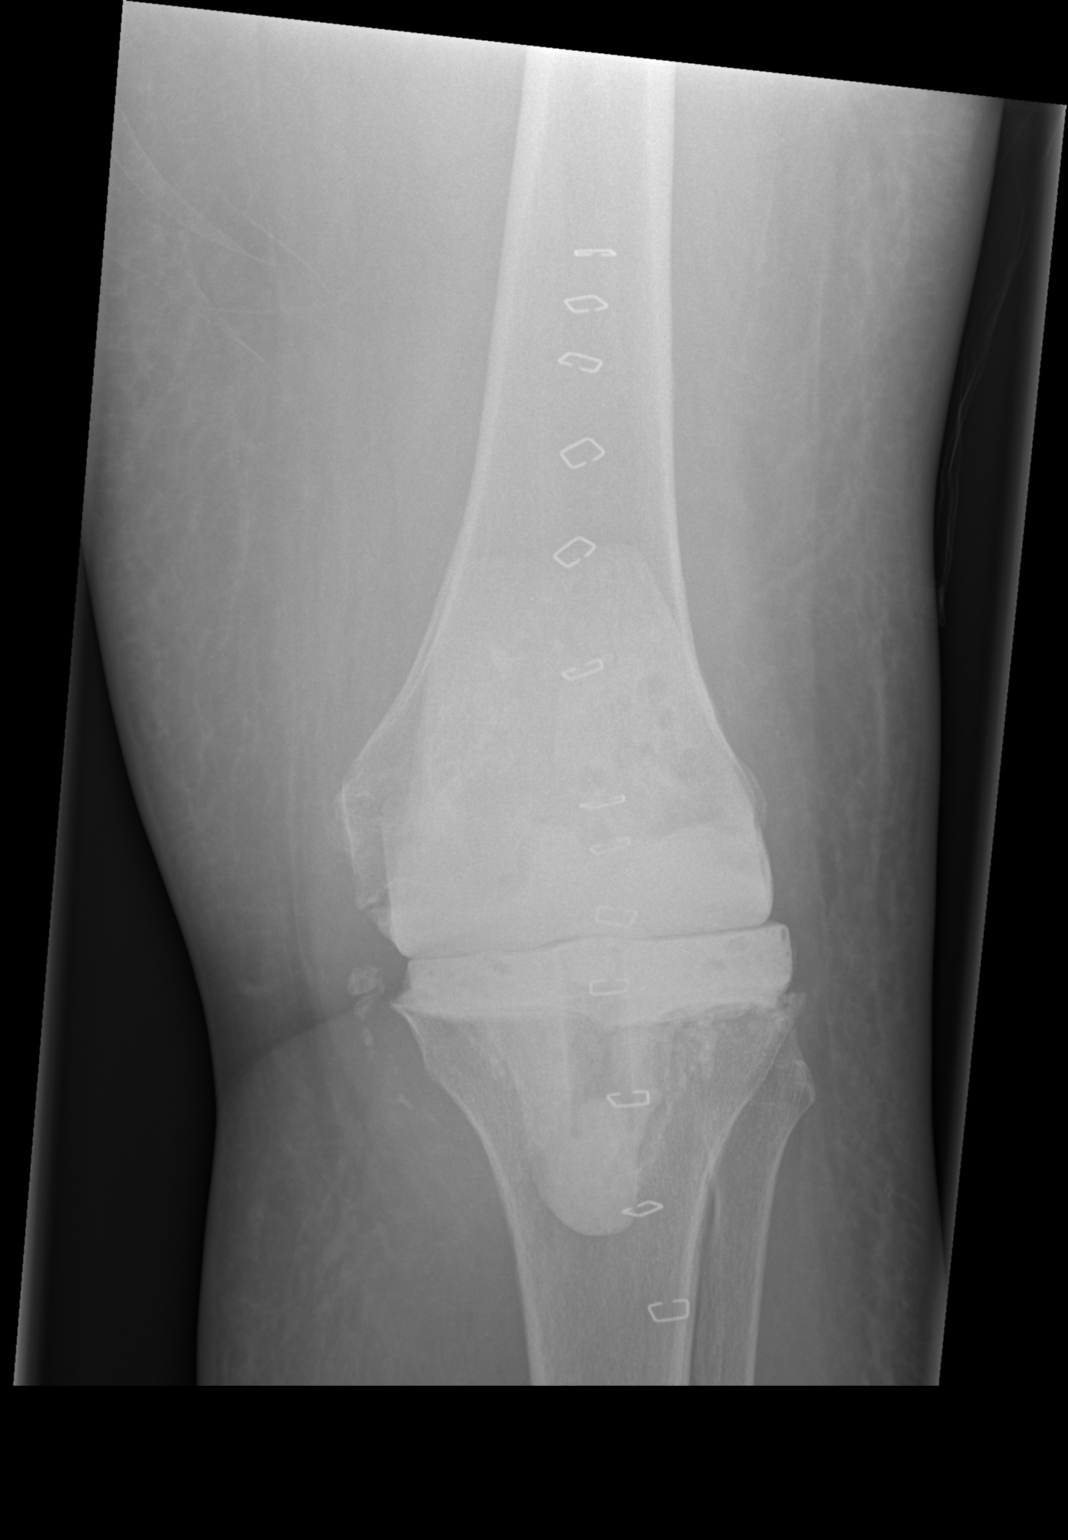

[x knee lat left]
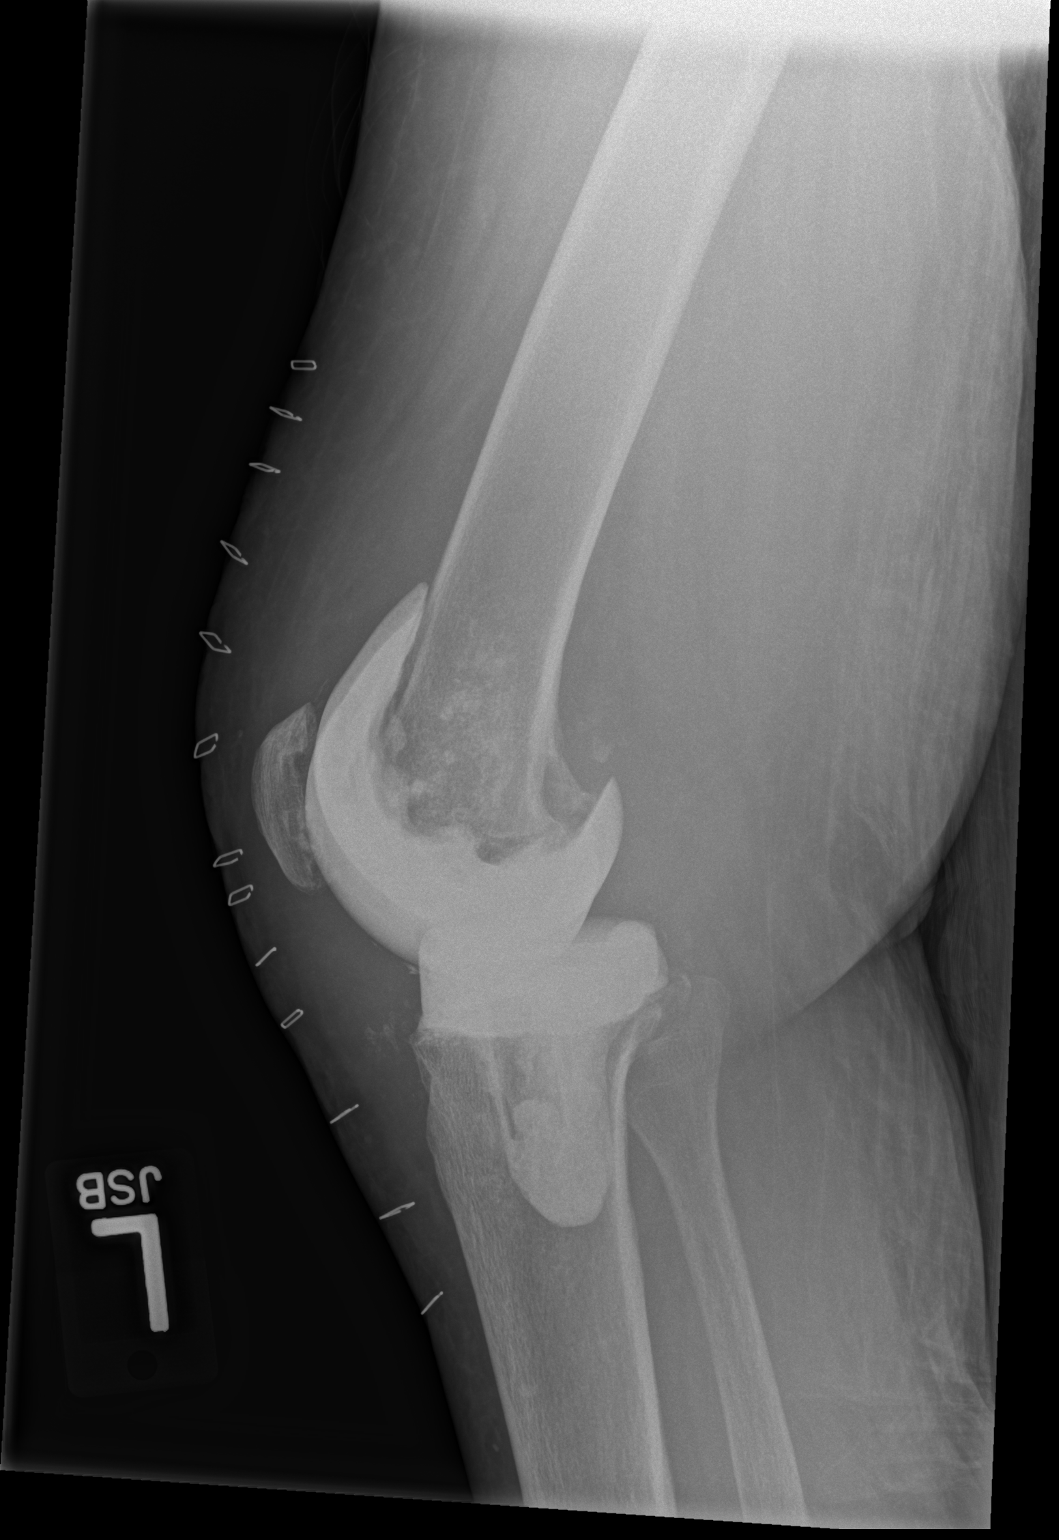

[2 of 2 positions shown; findings below may reference images not displayed]

FINDINGS: Prior left knee arthroplasty with anatomic alignment.
Diffuse soft tissue swelling and moderately large joint effusion.
Lucency between the anterior aspect of the femoral component of the
prosthesis and the femur, unchanged.
IMPRESSION: Diffuse soft tissue swelling and moderately large joint effusion.
Possible early loosening and/or granulomatosis involving the
femoral component prosthesis anteriorly.

## 2012-03-23 ENCOUNTER — Ambulatory Visit: Payer: Medicare Other | Admitting: Internal Medicine

## 2012-03-27 ENCOUNTER — Other Ambulatory Visit: Payer: Self-pay | Admitting: Emergency Medicine

## 2012-04-05 ENCOUNTER — Other Ambulatory Visit: Payer: Self-pay | Admitting: *Deleted

## 2012-04-05 DIAGNOSIS — N951 Menopausal and female climacteric states: Secondary | ICD-10-CM

## 2012-04-05 MED ORDER — FLUOXETINE HCL 20 MG PO CAPS: 20.0000 mg | ORAL_CAPSULE | Freq: Every day | ORAL | Status: DC

## 2012-04-05 NOTE — Telephone Encounter (Signed)
She needs her appointment rescheduled from 03/23/12 please.  She cancelled apparently.   Thanks  EB Daryll Drown

## 2012-04-05 NOTE — Telephone Encounter (Signed)
Message sent to front desk to schedule pt an appt. 

## 2012-04-10 ENCOUNTER — Ambulatory Visit (HOSPITAL_COMMUNITY)
Admission: RE | Admit: 2012-04-10 | Discharge: 2012-04-10 | Disposition: A | Discharge status: Home / Self Care | Payer: Medicare Other | Source: Ambulatory Visit | Attending: Internal Medicine | Admitting: Internal Medicine

## 2012-04-10 ENCOUNTER — Ambulatory Visit (INDEPENDENT_AMBULATORY_CARE_PROVIDER_SITE_OTHER): Payer: Medicare Other | Admitting: Internal Medicine

## 2012-04-10 ENCOUNTER — Encounter: Payer: Self-pay | Admitting: Internal Medicine

## 2012-04-10 VITALS — BP 119/69 | HR 79 | Temp 97.1°F | Ht 64.0 in | Wt 188.8 lb

## 2012-04-10 DIAGNOSIS — E1149 Type 2 diabetes mellitus with other diabetic neurological complication: Secondary | ICD-10-CM

## 2012-04-10 DIAGNOSIS — K3184 Gastroparesis: Secondary | ICD-10-CM

## 2012-04-10 DIAGNOSIS — E781 Pure hyperglyceridemia: Secondary | ICD-10-CM

## 2012-04-10 DIAGNOSIS — E1142 Type 2 diabetes mellitus with diabetic polyneuropathy: Secondary | ICD-10-CM

## 2012-04-10 DIAGNOSIS — E119 Type 2 diabetes mellitus without complications: Secondary | ICD-10-CM

## 2012-04-10 DIAGNOSIS — I10 Essential (primary) hypertension: Secondary | ICD-10-CM

## 2012-04-10 DIAGNOSIS — M899 Disorder of bone, unspecified: Secondary | ICD-10-CM | POA: Insufficient documentation

## 2012-04-10 DIAGNOSIS — M349 Systemic sclerosis, unspecified: Secondary | ICD-10-CM

## 2012-04-10 DIAGNOSIS — Z0000 Encounter for general adult medical examination without abnormal findings: Secondary | ICD-10-CM

## 2012-04-10 DIAGNOSIS — I2789 Other specified pulmonary heart diseases: Secondary | ICD-10-CM

## 2012-04-10 DIAGNOSIS — M79609 Pain in unspecified limb: Secondary | ICD-10-CM | POA: Insufficient documentation

## 2012-04-10 LAB — GLUCOSE, CAPILLARY: Glucose-Capillary: 112 mg/dL — ABNORMAL HIGH (ref 70–99)

## 2012-04-10 LAB — POCT GLYCOSYLATED HEMOGLOBIN (HGB A1C): Hemoglobin A1C: 5.8

## 2012-04-10 MED ORDER — FENOFIBRATE 48 MG PO TABS: 48.0000 mg | ORAL_TABLET | Freq: Every day | ORAL | Status: DC

## 2012-04-10 MED ORDER — LISINOPRIL-HYDROCHLOROTHIAZIDE 20-25 MG PO TABS: 1.0000 | ORAL_TABLET | Freq: Every day | ORAL | Status: DC

## 2012-04-10 NOTE — Assessment & Plan Note (Signed)
Well controlled today, 119/69.   Continue lisinopril/hctz combo pill, refilled

## 2012-04-10 NOTE — Assessment & Plan Note (Signed)
Followed by Dr. Lamonte Sakai.  No pulmonary complaints today.   Will defer management to Dr. Lamonte Sakai.

## 2012-04-10 NOTE — Assessment & Plan Note (Signed)
MMG: 08/05/11 - normal, repeat 1 year PAP: Due, discuss at next visit DEXA: 08/19/08 - normal, consider repeating in 2015 as she has been on prednisone for a long time.   Flu, pneumovax, tetanus UTD

## 2012-04-10 NOTE — Patient Instructions (Addendum)
Please schedule a follow up visit within the next 3-4 months.   For your medications:   Please bring all of your pill  Bottles with you to each visit.  This will help make sure that we have an up to date list of all the medications you are taking.  Please also bring any over the counter herbal medications you are taking (not including advil, tylenol, etc.)  Please continue taking:  Medications as prescribed by Dr's Lynnea Ferrier and Melburn Popper (Pulmonary - Lung) - Tracleer and warfarin Beekman (Rheumatology - Scleroderma) - Prednisone, reglan, vicoden, nexium Krista Blue (Neurologist) - Lyrica, gabapentin Ross (Cardiology) - Lanoxin  From our clinic - Dr. Daryll Drown Lisinopril-hctz 20-65m once daily Fenofibrate 435monce daily Metformin 50015mwo times per day Fluoxetine 8m70mily  Please stop taking Premarin, pravastatin  If you have any other prescriptions at home, please call the clinic to give us a[place] to date list.   For your Diabetes:   Please continue to check your blood sugar 3-4 times weekly.   Please keep a record of your blood sugars, either in your glucometer or on paper.  Please bring your glucometer to every clinic visit.   Please check your feet at least once weekly to see if you are developing calluses or wounds.   Continue to follow a good diet, including limiting refined sugars and carbohydrates and exercising at least 3 times per week.    Please schedule an eye exam within the next year.   For your hand pain - you will have xray's today.  My main concern is that the pain is related to weaning (tapering) of your prednisone, possibly due to some arthritis (joint pain) associated with your scleroderma.  Please discuss these symptoms with your Rheumatologist Dr. BeekAmil Amen possibly plan for an even slower taper of prednisone.   Thank you!

## 2012-04-10 NOTE — Assessment & Plan Note (Signed)
She takes reglan and nexium.  No acute complaints today.

## 2012-04-10 NOTE — Assessment & Plan Note (Signed)
She is treated with Metformin BID without any medication side effects or problems taking her medication.   A1C is 5.8 Foot exam done at last visit MAU/Cr - needs to be done, however, she is already on lisinopril Eye - she sees Dr. Quay Burow, reportedly, will need to get records.  BP at goal LDL 62, TG elevated  Continued to encourage good dietary changes.

## 2012-04-10 NOTE — Assessment & Plan Note (Signed)
I think her current hand pain symptoms could be due to arthralgia from scleroderma, flaring due to wean of prednisone.  I advised her to stay at current dose and discuss a possible slow wean from 2 mg (as this is likely the last dose where she did not have symptoms).  I asked her to discuss this possibility with her rheumatologist Dr. Amil Amen.  She will have hand xrays today - these have resulted as no erosive changes, mild degeneration bilaterally.

## 2012-04-10 NOTE — Assessment & Plan Note (Signed)
Her most recent lipid panel revealed LDL of 62, with TG of 683.    She has a history of being on fenofibrate, was started on pravastatin here last visit.   Will d/c pravastatin, restart Tricor.  Recheck Lipid panel at next visit.

## 2012-04-10 NOTE — Progress Notes (Signed)
Subjective:    Patient ID: [name], female    DOB: [birth year], 62 y.o.   MRN: [id number]  Follow up, trouble with her hands.   HPI  [name] is a 62yo woman with PMH of DM type 2, HTN, HLD, Scleroderma, gastroparesis who presents for routine follow up.   [name] is followed by multiple providers including a rheumatologist, Dr. Amil Amen, a pulmonologist, Dr. Lamonte Sakai, a Neurologist, Dr. Krista Blue and a Cardiologist Dr. Harrington Challenger.  She is also seen here for primary care. Due to her multiple providers, her medications are somewhat confusing and she is not sure what she is on.  She sees Dr. Amil Amen regularly for scleroderma.  She was recently told by this clinic (Dr. Margot Ables) to discontinue her prednisone, and Dr. Amil Amen facilitated a slow taper of this medication due to her long term use (since 1993).  She has been slowly weaning by 27m a month since that time.  She is currently on 168mdaily.  Over the past month, she has noticed some increased pain in her hands and wrists, which is limiting her ability to open her pill bottles.  This is new and she has not had this symptom before.  She does not have swelling or decreased range of motion.  She does not notice heat or erythema to the area.  The pain is bilateral and up to the wrists in both extremities.    Otherwise, she is doing well, she has no acute complaints.   She has HTN, for which she is treated with combo pill lisinopril/hctz.  She further has DM2, which is thought to be related to steroid use.  She was initiated on metformin and has had no symptoms from this medication.  She has peripheral neuropathy and is currently on lyrica and gabapentin.  This is managed by Dr. YaKrista BlueShe also has hypertriglyceridemia.  She has previously been on tricor, and was initiated on pravastatin in this clinic.  She has been taking both medications.  She also has issues with hot flashes since menopause.  She was taken off of premarin and is now on prozac for this symptom.   Her hot flashes are improved, though they initially worsened after stopping the premarin.  She also has pulmonary HTN, for whom she sees Dr. ByLamonte Sakaind she is treated with bosentan and coumadin.    She receives multiple medications from multiple providers.   [place]rum (Pulmonary - Lung) - Tracleer and warfarin Beekman (Rheumatology - Scleroderma) - Prednisone, reglan, vicoden, nexium YaKrista BlueNeurologist) - Lyrica, gabapentin Ross (Cardiology) - Lanoxin  From our clinic - Dr. MuDaryll Drownisinopril-hctz 20-2520mnce daily Fenofibrate 56m38mce daily Metformin 500mg19m times per day Fluoxetine 20mg 61my  Her PMH is up to date in EPIC.  She has no new surgeries or issues to report.   She is not smoking.   Review of Systems  Constitutional: Negative for activity change, fatigue and unexpected weight change.  HENT: Negative for hearing loss, ear pain, sore throat and trouble swallowing.   Eyes: Negative for pain and redness.  Respiratory: Negative for cough, chest tightness and shortness of breath.   Cardiovascular: Negative for chest pain and leg swelling.  Gastrointestinal: Negative for diarrhea, constipation and abdominal distention.  Genitourinary: Negative for dysuria and difficulty urinating.  Musculoskeletal: Positive for arthralgias. Negative for myalgias, joint swelling and gait problem.  Skin: Negative for color change, pallor, rash and wound.  Neurological: Negative for dizziness, tremors, light-headedness, numbness and headaches.  Psychiatric/Behavioral: Negative  for confusion and decreased concentration.       Objective:   Physical Exam  Constitutional: She is oriented to person, place, and time. She appears well-developed and well-nourished. No distress.  HENT:  Head: Normocephalic and atraumatic.  Eyes: Conjunctivae normal and EOM are normal. Pupils are equal, round, and reactive to light. Right eye exhibits no discharge. Left eye exhibits no discharge.  Neck: Neck supple. No  thyromegaly present.  Cardiovascular: Normal rate, regular rhythm and normal heart sounds.  Exam reveals no friction rub.   Pulmonary/Chest: Effort normal and breath sounds normal.       + crackles at bases  Abdominal: Soft. Bowel sounds are normal. She exhibits no distension.  Musculoskeletal: She exhibits edema.       Right wrist: She exhibits tenderness. She exhibits normal range of motion, no swelling, no effusion, no crepitus and no deformity.       Left wrist: She exhibits decreased range of motion and tenderness. She exhibits no swelling, no effusion and no crepitus.       Arms:      Edema to bilateral LE  Neurological: She is alert and oriented to person, place, and time.  Skin: Skin is warm and dry. No rash noted. She is not diaphoretic. No erythema.       She has some pinched skin in the face, but otherwise no tightening of skin to the hands.   Psychiatric: She has a normal mood and affect. Her behavior is normal.        Assessment & Plan:  Please see [place] back in 3-4 months.  I spent about 2/3 (20 minutes) coordinating and clearing up her medications as she has multiple providers and was unclear of her medications.

## 2012-04-18 ENCOUNTER — Other Ambulatory Visit: Payer: Self-pay | Admitting: Internal Medicine

## 2012-04-20 ENCOUNTER — Ambulatory Visit (INDEPENDENT_AMBULATORY_CARE_PROVIDER_SITE_OTHER): Payer: Medicare Other

## 2012-04-20 DIAGNOSIS — I2789 Other specified pulmonary heart diseases: Secondary | ICD-10-CM

## 2012-04-20 DIAGNOSIS — Z7901 Long term (current) use of anticoagulants: Secondary | ICD-10-CM

## 2012-04-20 LAB — HEPATIC FUNCTION PANEL
ALT: 15 U/L (ref 0–35)
Alkaline Phosphatase: 101 U/L (ref 39–117)
Bilirubin, Direct: 0.2 mg/dL (ref 0.0–0.3)
Total Bilirubin: 0.9 mg/dL (ref 0.3–1.2)
Total Protein: 7.9 g/dL (ref 6.0–8.3)

## 2012-04-21 NOTE — Progress Notes (Signed)
Quick Note:  Called number on demographics, a female answered and said this was the wrong number. Will await call. ______

## 2012-04-21 NOTE — Progress Notes (Signed)
Quick Note:  Other numbers (sister and son) in chart are also unavailable. ______

## 2012-05-08 ENCOUNTER — Encounter: Payer: Self-pay | Admitting: Internal Medicine

## 2012-05-08 ENCOUNTER — Ambulatory Visit (INDEPENDENT_AMBULATORY_CARE_PROVIDER_SITE_OTHER): Payer: Medicare Other | Admitting: Internal Medicine

## 2012-05-08 VITALS — BP 102/68 | HR 67 | Ht 64.0 in | Wt 184.4 lb

## 2012-05-08 DIAGNOSIS — I10 Essential (primary) hypertension: Secondary | ICD-10-CM

## 2012-05-08 NOTE — Progress Notes (Addendum)
HPI Patient is a 62 year old with a history of scleroderma and pulmonary hypertension Patient seen in cardiology Last winter Breathing unchanged  No Chest pressure.  NO presyncope or syncope Followed by R Byrum in Pulmonary.  Echo planned fore June.   Allergies  Allergen Reactions  . Cephalexin   . Ciprofloxacin   . Codeine     REACTION: GI upset  . Contrast Media (Iodinated Diagnostic Agents)   . Iohexol      Code: HIVES, Desc: pt breaks out in large hives. needs full premeds, Onset Date: [id number]     Current Outpatient Prescriptions  Medication Sig Dispense Refill  . bosentan (TRACLEER) 125 MG tablet Take 1 tablet (125 mg total) by mouth 2 (two) times daily.  60 tablet  11  . esomeprazole (NEXIUM) 40 MG capsule Take by mouth 2 (two) times daily.        . fenofibrate (TRICOR) 48 MG tablet Take 1 tablet (48 mg total) by mouth daily.  30 tablet  11  . FLUoxetine (PROZAC) 20 MG capsule Take 1 capsule (20 mg total) by mouth daily.  30 capsule  2  . gabapentin (NEURONTIN) 100 MG capsule Take 1 capsule (100 mg total) by mouth 2 (two) times daily.  30 capsule  2  . Hydrocodone-APAP-Dietary Prod (HYDROCODONE-APAP-NUTRIT SUPP) 10-650 MG MISC       . ibuprofen (ADVIL) 200 MG tablet Take 200 mg by mouth every 6 (six) hours as needed.        Marland Kitchen LANOXIN 0.125 MG tablet take 1 tablet by mouth once daily  30 each  2  . lisinopril-hydrochlorothiazide (PRINZIDE,ZESTORETIC) 20-25 MG per tablet Take 1 tablet by mouth daily. Once a day  30 tablet  3  . LYRICA 150 MG capsule       . metFORMIN (GLUCOPHAGE) 500 MG tablet Take 1 tablet (500 mg total) by mouth 2 (two) times daily with a meal.  60 tablet  11  . metoCLOPramide (REGLAN) 10 MG tablet Take 1/2 tablet by mouth before meals and at bedtime  180 tablet  1  . ONE TOUCH LANCETS MISC by Does not apply route. Check Blood Glucose 2-3 times a day.       . predniSONE (DELTASONE) 5 MG tablet Take 5 mg by mouth daily. Take for two weeks then stop      .  warfarin (COUMADIN) 2.5 MG tablet take by mouth as directed BY COUMADIN CLINIC  100 tablet  0  . warfarin (COUMADIN) 2.5 MG tablet Take as directed per coumadin clinic  100 tablet  1  . warfarin (COUMADIN) 2.5 MG tablet TAKE AS DIRECTED BY COUMADIN CLINIC  100 tablet  0  . zoster vaccine live, PF, (ZOSTAVAX) 05397 UNT/0.65ML injection Inject 19,400 Units into the skin once.  1 each  0   No current facility-administered medications for this visit.    Past Medical History  Diagnosis Date  . Secondary pulmonary hypertension     right heart cath 04/20/04  . Cough   . Allergic rhinitis, cause unspecified   . Type II or unspecified type diabetes mellitus without mention of complication, not stated as uncontrolled   . Degeneration of lumbar or lumbosacral intervertebral disc   . Esophageal reflux   . Systemic sclerosis   . Unspecified essential hypertension   . Rotator cuff tear   . Gastritis   . Sickle cell trait   . Arthritis   . Obesity   . Visual changes   .  Scleroderma   . Diastolic dysfunction   . Trichomonas   . Vaginal bleeding     Past Surgical History  Procedure Laterality Date  . Partial hysterectomy  1983  . Tubal ligation    . Tonsillectomy    . Upper endoscopy with biopsy  03/10/2006  . Right shoulder arthroscopy, subacromial decompression    . Cardiac catheterization  04/24/2004  . Replacement total knee  11/2000    Family History  Problem Relation Age of Onset  . Heart disease Mother   . Diabetes Mother   . Diabetes Sister     History   Social History  . Marital Status: Divorced    Spouse Name: N/A    Number of Children: 2  . Years of Education: N/A   Occupational History  . Umemployed    Social History Main Topics  . Smoking status: Never Smoker   . Smokeless tobacco: Never Used  . Alcohol Use: No  . Drug Use: No  . Sexually Active: Not on file   Other Topics Concern  . Not on file   Social History Narrative    FAMILY HISTORY:  Significant  for coronary artery disease and diabetes    Review of Systems:  All systems reviewed.  They are negative to the above problem except as previously stated.  Vital Signs: BP 102/68  Pulse 67  Ht _0  (1.626 m)  Wt 184 lb 6.4 oz (83.643 kg)  BMI 31.64 kg/m2  Physical Exam Patient is in NAD HEENT:  Normocephalic, atraumatic. EOMI, PERRLA.  Neck: JVP is normal.  No bruits.  Lungs: clear to auscultation. No rales no wheezes.  Heart: Regular rate and rhythm. Normal S1, S2 (increased P2). No S3.   No significant murmurs. PMI not displaced.  Abdomen:  Supple, nontender. Normal bowel sounds. No masses. No hepatomegaly.  Extremities:   Good distal pulses throughout. No lower extremity edema.  Musculoskeletal :moving all extremities.  Neuro:   alert and oriented x3.  CN II-XII grossly intact.  EKG  SR 71   Assessment and Plan:  1.  Pulmonary hypertension.   Patient being followed in pulmonary Refer to that evaluation.  Symptoms appear stable. Volume status looks OK.   She did not do a walk test today.   Would continue on coumadin.  WIll check CBC  Yearly f/u her since seen in this clinic.  Otherwise f/u with R Byrum.

## 2012-05-08 NOTE — Patient Instructions (Addendum)
Please have a CBC drawn in our lab when you come for your Coumadin Visit in March 2014.  Your physician wants you to follow-up in: 1 year with Dr. Harrington Challenger.  You will receive a reminder letter in the mail two months in advance. If you don't receive a letter, please call our office to schedule the follow-up appointment.

## 2012-06-01 ENCOUNTER — Other Ambulatory Visit (INDEPENDENT_AMBULATORY_CARE_PROVIDER_SITE_OTHER): Payer: Medicare Other

## 2012-06-01 ENCOUNTER — Other Ambulatory Visit: Payer: Self-pay | Admitting: Emergency Medicine

## 2012-06-01 ENCOUNTER — Ambulatory Visit (INDEPENDENT_AMBULATORY_CARE_PROVIDER_SITE_OTHER): Payer: Medicare Other | Admitting: *Deleted

## 2012-06-01 DIAGNOSIS — I2789 Other specified pulmonary heart diseases: Secondary | ICD-10-CM

## 2012-06-01 DIAGNOSIS — I10 Essential (primary) hypertension: Secondary | ICD-10-CM

## 2012-06-01 DIAGNOSIS — Z79899 Other long term (current) drug therapy: Secondary | ICD-10-CM

## 2012-06-01 DIAGNOSIS — I272 Pulmonary hypertension, unspecified: Secondary | ICD-10-CM

## 2012-06-01 DIAGNOSIS — Z7901 Long term (current) use of anticoagulants: Secondary | ICD-10-CM

## 2012-06-01 LAB — CBC WITH DIFFERENTIAL/PLATELET
Basophils Relative: 0.5 % (ref 0.0–3.0)
Eosinophils Absolute: 0.1 10*3/uL (ref 0.0–0.7)
Eosinophils Relative: 2.7 % (ref 0.0–5.0)
Lymphocytes Relative: 37.6 % (ref 12.0–46.0)
MCHC: 32 g/dL (ref 30.0–36.0)
MCV: 82.9 fl (ref 78.0–100.0)
Monocytes Absolute: 0.6 10*3/uL (ref 0.1–1.0)
Neutrophils Relative %: 47.1 % (ref 43.0–77.0)
Platelets: 186 10*3/uL (ref 150.0–400.0)
RBC: 3.84 Mil/uL — ABNORMAL LOW (ref 3.87–5.11)
WBC: 5.2 10*3/uL (ref 4.5–10.5)

## 2012-06-01 LAB — HEPATIC FUNCTION PANEL
ALT: 17 U/L (ref 0–35)
AST: 26 U/L (ref 0–37)
Total Bilirubin: 1 mg/dL (ref 0.3–1.2)
Total Protein: 7.6 g/dL (ref 6.0–8.3)

## 2012-06-01 MED ORDER — WARFARIN SODIUM 2.5 MG PO TABS: ORAL_TABLET | ORAL | Status: DC

## 2012-06-09 NOTE — Progress Notes (Signed)
Quick Note:  ATC patient , no answer. Phone rang several times with no option to leave message. WCB ______

## 2012-06-16 NOTE — Progress Notes (Signed)
Quick Note:  Spoke with patient informed her of results/recs per RB Patient verbalized understanding and nothing further needed at this time. ______

## 2012-06-17 ENCOUNTER — Encounter: Payer: Self-pay | Admitting: Internal Medicine

## 2012-06-17 DIAGNOSIS — R269 Unspecified abnormalities of gait and mobility: Secondary | ICD-10-CM | POA: Insufficient documentation

## 2012-07-04 ENCOUNTER — Other Ambulatory Visit: Payer: Self-pay | Admitting: Internal Medicine

## 2012-07-10 ENCOUNTER — Encounter: Payer: Self-pay | Admitting: Internal Medicine

## 2012-07-10 ENCOUNTER — Ambulatory Visit (INDEPENDENT_AMBULATORY_CARE_PROVIDER_SITE_OTHER): Payer: Medicare Other | Admitting: Internal Medicine

## 2012-07-10 VITALS — BP 104/62 | HR 72 | Temp 97.5°F | Ht 64.0 in | Wt 169.3 lb

## 2012-07-10 DIAGNOSIS — I119 Hypertensive heart disease without heart failure: Secondary | ICD-10-CM

## 2012-07-10 DIAGNOSIS — I2789 Other specified pulmonary heart diseases: Secondary | ICD-10-CM

## 2012-07-10 DIAGNOSIS — E781 Pure hyperglyceridemia: Secondary | ICD-10-CM

## 2012-07-10 DIAGNOSIS — Z0000 Encounter for general adult medical examination without abnormal findings: Secondary | ICD-10-CM

## 2012-07-10 DIAGNOSIS — I10 Essential (primary) hypertension: Secondary | ICD-10-CM

## 2012-07-10 DIAGNOSIS — E1142 Type 2 diabetes mellitus with diabetic polyneuropathy: Secondary | ICD-10-CM

## 2012-07-10 DIAGNOSIS — E1149 Type 2 diabetes mellitus with other diabetic neurological complication: Secondary | ICD-10-CM

## 2012-07-10 LAB — LIPID PANEL
HDL: 26 mg/dL — ABNORMAL LOW (ref >39)
LDL Cholesterol: 77 mg/dL (ref 0–99)
Triglycerides: 161 mg/dL — ABNORMAL HIGH (ref <150)
VLDL: 32 mg/dL (ref 0–40)

## 2012-07-10 LAB — BASIC METABOLIC PANEL WITH GFR
CO2: 20 mEq/L (ref 19–32)
Calcium: 9.5 mg/dL (ref 8.4–10.5)
Creat: 1.36 mg/dL — ABNORMAL HIGH (ref 0.50–1.10)
GFR, Est African American: 49 mL/min — ABNORMAL LOW
GFR, Est Non African American: 42 mL/min — ABNORMAL LOW
Sodium: 140 mEq/L (ref 135–145)

## 2012-07-10 LAB — POCT GLYCOSYLATED HEMOGLOBIN (HGB A1C): Hemoglobin A1C: 5.4

## 2012-07-10 MED ORDER — ONETOUCH LANCETS MISC: 1.0000 | Freq: Three times a day (TID) | Status: DC

## 2012-07-10 MED ORDER — GLUCOSE BLOOD VI STRP: ORAL_STRIP | Status: DC

## 2012-07-10 NOTE — Patient Instructions (Addendum)
General Instructions: Please schedule a follow up visit within the next 6 months.   For your medications:   Please bring all of your pill  Bottles with you to each visit.  This will help make sure that we have an up to date list of all the medications you are taking.  Please also bring any over the counter herbal medications you are taking (not including advil, tylenol, etc.)  Please continue taking all of your medications as prescribed.  I will refill your diabetes medications, test strips and lancets today.   For your Diabetes:  Please continue to check your blood sugar 1 times a day.   Please keep a record of your blood sugars, either in your glucometer or on paper.  Please bring your glucometer to every clinic visit.  Please check your feet at least once weekly to see if you are developing calluses or wounds.  Continue to follow a good diet, including limiting refined sugars and carbohydrates and exercising at least 3 times per week.   We will get records of your eye exam.   Thank you!   Treatment Goals:  Goals (1 Years of Data) as of 07/10/12     Lifestyle    . Prevent Falls      Progress Toward Treatment Goals:  Treatment Goal 07/10/2012  Hemoglobin A1C at goal  Blood pressure at goal   Self Care Goals & Plans:  Self Care Goal 07/10/2012  Manage my medications take my medicines as prescribed  Monitor my health bring my glucose meter and log to each visit  Eat healthy foods -    Home Blood Glucose Monitoring 07/10/2012  Check my blood sugar once a day  When to check my blood sugar before breakfast   Care Management & Community Referrals:  Referral 07/10/2012  Referrals made for care management support none needed  Referrals made to community resources none

## 2012-07-10 NOTE — Assessment & Plan Note (Signed)
Follows with Pulmonary.  Breathing at baseline today.

## 2012-07-10 NOTE — Assessment & Plan Note (Signed)
BP Readings from Last 3 Encounters:  07/10/12 104/62  05/08/12 102/68  04/10/12 119/69    Lab Results  Component Value Date   NA 139 12/09/2011   K 4.3 12/09/2011   CREATININE 0.73 12/09/2011    Assessment: Blood pressure control: controlled Progress toward BP goal:  at goal Comments: patient tolerating medications well  Plan: Medications:  continue current medications Educational resources provided: brochure Self management tools provided: home blood pressure logbook Other plans: check BMET today for renal function

## 2012-07-10 NOTE — Addendum Note (Signed)
Addended by: Gilles Chiquito B on: 07/10/2012 12:17 PM   Modules accepted: Orders, Medications

## 2012-07-10 NOTE — Assessment & Plan Note (Signed)
Started on tricor at last visit, check lipid panel today

## 2012-07-10 NOTE — Progress Notes (Signed)
Subjective:    Patient ID: [name], female    DOB: [birth year], 62 y.o.   MRN: [id number]  HPI  [name] is a 62yo woman with HTN, HTG, DM2, scleroderma who presents for routine follow up.   [name] sees multiple specialists.  Here in our clinic she is followed for her HTN, DM2 and cholesterol.  She is also prescribed her mood medication (fluoxetine).   Today, [name] reports her joint pain is improved.  She is currently off of steroids and suffering a "normal" amount of pain.   Fall recently: fell in the backyard, house slippers were on, picking up sticks in the yard, moving too fast and fell, seems mechanical.  No dizziness, no LOC, fell on back.    Received the zoster vaccine since last being seen.   No other issues.  Has had weight loss since being off prednisione, she doesn't eat as much as she used to, but still eating regular meals.    For her DM: She is on metformin and has good control.  For her BP: she is on lisinopril-hctz and this is well controlled. She will need a lipid panel today for evaluation of TG after restarting tricor.   Medications -  Byrum (Pulmonary - Lung) - Tracleer and warfarin  Beekman (Rheumatology - Scleroderma) - Prednisone - recently stopped this, reglan, vicoden, nexium  Krista Blue (Neurologist) - Lyrica, gabapentin  Ross (Cardiology) - Lanoxin  Burns (eye)  From our clinic - Dr. Daryll Drown  Lisinopril-hctz 20-35m once daily  Fenofibrate 477monce daily  Metformin 50074mwo times per day  Fluoxetine 55m35mily  Not smoking.   Review of Systems  Constitutional: Positive for appetite change. Negative for fever and chills.       Decreased since stopping prednisone  HENT: Positive for congestion and postnasal drip. Negative for hearing loss, ear pain and trouble swallowing.   Eyes: Negative for pain, redness and visual disturbance.  Respiratory: Negative for cough, shortness of breath and wheezing.   Cardiovascular: Negative for chest pain.   Gastrointestinal: Negative for nausea, vomiting, abdominal pain and blood in stool.  Genitourinary: Negative for dysuria, frequency and difficulty urinating.  Musculoskeletal: Positive for arthralgias. Negative for gait problem.  Skin: Negative for color change and rash.  Neurological: Negative for dizziness, syncope, light-headedness and headaches.  Psychiatric/Behavioral: Positive for decreased concentration. Negative for confusion.       From previous stroke       Objective:   Physical Exam  Constitutional: She is oriented to person, place, and time. She appears well-developed and well-nourished. No distress.  HENT:  Head: Normocephalic and atraumatic.  Mouth/Throat: No oropharyngeal exudate.  Eyes: Conjunctivae and EOM are normal. Pupils are equal, round, and reactive to light. No scleral icterus.  Neck: Normal range of motion. Neck supple.  Cardiovascular: Normal rate, regular rhythm and normal heart sounds.   No murmur heard. Pulmonary/Chest: Effort normal and breath sounds normal. No respiratory distress. She has no wheezes.  Abdominal: Soft. Bowel sounds are normal.  Musculoskeletal: She exhibits no tenderness.  She has decreased ROM in her hands and some thickening of the skin of her hands.  She has no edema in her LE.  She has no tenderness to palpation of the small joints of her hands.    Lymphadenopathy:    She has no cervical adenopathy.  Neurological: She is alert and oriented to person, place, and time.  Grip strength is decreased 2/2 decreased ROM in the hands and  arthritis.  Otherwise, her gait is normal and strength is 4+/5 throughout.   Skin: Skin is warm and dry. No rash noted. No erythema.  Psychiatric: She has a normal mood and affect. Her behavior is normal.    Labs: BMET and Lipid panel pending     Assessment & Plan:  RTC in 6 months for follow up of DM and HTN

## 2012-07-10 NOTE — Assessment & Plan Note (Addendum)
A1C: 5.4 LDL: previously 62, recheck lipids today Eye: Sees Dr. Quay Burow, need to obtain his notes; patient requested they be sent to [place].  Foot: Checked at last visit - + neuropathy, follows with neurology BP: Well controlled 104/62 Kidney: on ACE-I, check BMET today  Lab Results  Component Value Date   HGBA1C 5.4 07/10/2012   HGBA1C 5.8 04/10/2012   HGBA1C 7.6 12/09/2011     Assessment: Diabetes control: good control (HgbA1C at goal) Progress toward A1C goal:  at goal Comments: continue metformin  Plan: Medications:  continue current medications Home glucose monitoring: Frequency: once a day Timing: before breakfast Instruction/counseling given: reminded to get eye exam Educational resources provided: brochure Self management tools provided: copy of home glucose meter download Other plans: Continue as above, check lipids and bmet today

## 2012-08-03 ENCOUNTER — Other Ambulatory Visit: Payer: Self-pay | Admitting: Internal Medicine

## 2012-08-11 ENCOUNTER — Telehealth: Payer: Self-pay | Admitting: Emergency Medicine

## 2012-08-11 ENCOUNTER — Encounter: Payer: Self-pay | Admitting: Pulmonary Disease

## 2012-08-11 MED ORDER — WARFARIN SODIUM 2.5 MG PO TABS: ORAL_TABLET | ORAL | Status: DC

## 2012-08-11 NOTE — Telephone Encounter (Signed)
ATC the pt, still NA

## 2012-08-11 NOTE — Telephone Encounter (Signed)
ATC NA

## 2012-08-11 NOTE — Telephone Encounter (Signed)
ATC, still NA

## 2012-08-11 NOTE — Telephone Encounter (Signed)
Rx for warfarin was refilled  ATC the pt, NA and no option to leave a msg, [facility]

## 2012-08-14 NOTE — Telephone Encounter (Signed)
ATC patient-- phone rang several times no answer no option to leave msg

## 2012-08-15 NOTE — Telephone Encounter (Signed)
I called [facility] and the pt has not picked up Rx yet.  I ATC the pt again and phone rang multiple times, NA, no voicemail. I also called number listed for pt sister but it was disconnected. [place] Bing, CMA

## 2012-08-15 NOTE — Telephone Encounter (Signed)
ATC pt -- phone rang several times with no answer and no option to leave msg. ATC pharm to see if rx has been picked up but they are closed.

## 2012-08-16 NOTE — Telephone Encounter (Signed)
ATC pt still NA. Called son # and received message was unavailable. Will sign off message per triage protocol.

## 2012-08-17 ENCOUNTER — Encounter: Payer: Self-pay | Admitting: Nurse Practitioner

## 2012-08-17 ENCOUNTER — Ambulatory Visit (INDEPENDENT_AMBULATORY_CARE_PROVIDER_SITE_OTHER): Payer: Medicare Other | Admitting: Nurse Practitioner

## 2012-08-17 VITALS — BP 98/52 | HR 73 | Ht 65.0 in | Wt 169.0 lb

## 2012-08-17 DIAGNOSIS — R269 Unspecified abnormalities of gait and mobility: Secondary | ICD-10-CM

## 2012-08-17 DIAGNOSIS — E1142 Type 2 diabetes mellitus with diabetic polyneuropathy: Secondary | ICD-10-CM

## 2012-08-17 DIAGNOSIS — R209 Unspecified disturbances of skin sensation: Secondary | ICD-10-CM | POA: Insufficient documentation

## 2012-08-17 DIAGNOSIS — E1149 Type 2 diabetes mellitus with other diabetic neurological complication: Secondary | ICD-10-CM

## 2012-08-17 MED ORDER — PREGABALIN 150 MG PO CAPS: 150.0000 mg | ORAL_CAPSULE | Freq: Three times a day (TID) | ORAL | Status: DC

## 2012-08-17 MED ORDER — GABAPENTIN 100 MG PO CAPS: 100.0000 mg | ORAL_CAPSULE | Freq: Two times a day (BID) | ORAL | Status: DC

## 2012-08-17 NOTE — Patient Instructions (Addendum)
Take Lyrica three times daily Continue Gabapentin at current dose F/U yearly

## 2012-08-17 NOTE — Progress Notes (Signed)
HPI: Patient  returns for followup after last visit with Dr. Krista Blue 07/21/2011. She has a history of diabetic peripheral neuropathy. She presented in 2005 with bilateral feet paresthesias which preceded her diagnosis of type 2 diabetes. EMG nerve conduction study in 2006 was normal there was no evidence of large fiber peripheral neuropathy. Her symptoms have progressed over the years, she is currently on Lyrica 75 mg twice daily and gabapentin 100 twice a day. She has a history of scleroderma but is on low-dose prednisone.  ROS:  Negative   Physical Exam General: well developed, well nourished, seated, in no evident distress Head: head normocephalic and atraumatic. Oropharynx benign Neck: supple with no carotid  bruits Cardiovascular: regular rate and rhythm, no murmurs  Neurologic Exam Mental Status: Awake and fully alert. Oriented to place and time. Follows all commands. Speech and language normal.   Cranial Nerves: Pupils equal, briskly reactive to light. Extraocular movements full without nystagmus. Visual fields full to confrontation. Hearing intact and symmetric to finger snap. Facial sensation intact. Face, tongue, palate move normally and symmetrically. Neck flexion and extension normal.  Motor: Normal bulk and tone. Normal strength in all tested extremity muscles.No focal weakness Sensory.: Decreased light touch, decreased pinprick to shin level,  preserved vibratory sensation.  Coordination: Rapid alternating movements normal in all extremities. Finger-to-nose and heel-to-shin performed accurately bilaterally. Gait and Station: Arises from chair without difficulty. Stance is normal. Gait demonstrates normal stride length and balance . Able to heel, toe and tandem walk without difficulty.  Reflexes: 2+ and symmetric except absent Achilles. Toes downgoing.     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

## 2012-09-21 ENCOUNTER — Other Ambulatory Visit: Payer: Self-pay

## 2012-09-26 ENCOUNTER — Ambulatory Visit: Payer: Medicare Other | Admitting: Emergency Medicine

## 2012-09-27 ENCOUNTER — Encounter: Payer: Self-pay | Admitting: Emergency Medicine

## 2012-10-03 ENCOUNTER — Other Ambulatory Visit: Payer: Self-pay

## 2012-10-03 DIAGNOSIS — Z1231 Encounter for screening mammogram for malignant neoplasm of breast: Secondary | ICD-10-CM

## 2012-10-04 ENCOUNTER — Encounter: Payer: Self-pay | Admitting: Emergency Medicine

## 2012-10-04 ENCOUNTER — Ambulatory Visit: Payer: Self-pay | Admitting: Pharmacist

## 2012-10-04 ENCOUNTER — Ambulatory Visit (INDEPENDENT_AMBULATORY_CARE_PROVIDER_SITE_OTHER): Payer: Medicare Other | Admitting: Emergency Medicine

## 2012-10-04 VITALS — BP 118/60 | HR 63 | Temp 98.0°F | Ht 64.0 in | Wt 171.4 lb

## 2012-10-04 DIAGNOSIS — Z7901 Long term (current) use of anticoagulants: Secondary | ICD-10-CM

## 2012-10-04 DIAGNOSIS — I2789 Other specified pulmonary heart diseases: Secondary | ICD-10-CM

## 2012-10-04 NOTE — Assessment & Plan Note (Signed)
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 She was best controlled when she was on tyvaso, but stopped it at her request. She is non-compliant w LFT checks, and now I have learned that she is non-compliant w INR's at the anti-coag clinic.  - I am stopping Tracleer and coumadin now. Explained to her that I wouldn't give these meds to her if she couldn't do the labs.  - will repeat her TTE in 4 months, assess worsening in PASP, try to discuss the benefits of meds and compliance at that time.

## 2012-10-04 NOTE — Progress Notes (Signed)
[name] is a 62 year old woman with scleroderma and severe secondary pulmonary hypertension. She had been treated with bosentan + Coumadin. Initiated [name], Adcirca in the past but unable to tolerate. Finally started on inhaled Tyvaso in 2010. Some delay in titrating up to goal dosing of 9 puffs 4x a day due to compliance issues, but finally able to do so. This was then stopped in Summer 2012 during admission for septic knee hardware.   By TTE, her PASP improved from 102 mmHg (7/09) to 61mHg (9/10), 6 minute walk improved to 3219m11/2010).   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 That hospitalization ultimately resulted in ICU stay for sepsis, c/b renal insufficiency. TTE on June 29 showed estimated RVSP of 63 mmHg (off Tyvaso). We sent her home on bosentan + coumadin, with plans to restart Tyvaso as outpt once she stabilized. She went home on IV Abx.   Readmitted 09/2010 with fever and leukocytosis, S Cr 1.8 - 2.0. Pulm/CCM consulted to assist w her management of PAH medications.  ROV 10/29/10 -- scleroderma, PAH. Has been hospitalized x3 since our last visit due to infected knee hardware. She is off tyvaso. She is planning to go back for repeat L knee surgery this month. She states that her breathing has been going well. She is on Tracleer. She does not want to restart the Tyvaso at this time. Denies dyspnea, syncope, CP.  She is finished with IV abx.   ROV 02/01/11 -- scleroderma, PAH. Remains on coumadin + bosentan but off Tyvaso as above. Last TTE on June 29 showed estimated RVSP of 63 mmHg (off Tyvaso).  She returns for f/u. Tells me that since last visit she underwent f/u knee sgy to replace hardware. She feels it is doing well, now off abx. She is not interested in going back on Tyvaso. Her breathing  seems to be stable, although she isn't really pushing herself.   ROV 06/21/11 -- scleroderma, PAH. Remains on coumadin + bosentan but off Tyvaso. She clinically feels well, has been able to increase her activity but PAP's have increased by TTE 09/11/10. We have planned for repeat TTE in June 2013. Remains on Pred 78m12md for her scleroderma. She asks today about whether a FXa inhibitor would be possible for her, but I don't know of any [facility]. Due for LFT's, wants to get them at coumadin clinic if possible, she will call to arrange.   ROV 10/04/11 -- scleroderma, PAH. Remains on coumadin + bosentan but off Tyvaso. TTE on 08/18/11 shows PASP rising, now 878m21m(from 63 08/2010; her best was 42 in 11/2008). Clinically, she feels well - no breathing complaints. Her wt has been stable, trace edema without change. She ambulates and does her usual activities, denies dyspnea.   ROV 12/13/11 -- scleroderma, PAH. Remains on coumadin + bosentan but off Tyvaso. TTE on 08/18/11 shows PASP rising, now 878mm70mfrom 63 08/2010; her best was 42 in 11/2008). Returns today to discuss Tyvaso - She is still against doing it due to the fact that it is time consuming, difficult to do 4-6 times a day. She isn't sure that it helps her breathing. Her TTE showed moderate RV dilation 6/13. Denies SOB, edema or CP.   ROV 10/04/12 -- scleroderma, PAH.  Remains on coumadin + bosentan but off Tyvaso. She has not had her TTE. Has not been compliant with INR checks at [facility] Last LFT in my records 05/2012. She reports that she has been feeling well. No breathing limitations. She is asking to be off of coumadin, off of Tracleer.    Exam Filed Vitals:   10/04/12 1047  BP: 118/60  Pulse: 63  Temp: 98 F (36.7 C)   Gen: Pleasant, well-nourished, in no distress,    ENT: No lesions,  mouth clear,  oropharynx clear, no postnasal drip  Neck: No JVD, no TMG, no carotid bruits  Lungs: No use of accessory muscles, no  dullness to percussion, clear without rales or rhonchi  Cardiovascular: RRR, heart sounds normal, no murmur or gallops, no peripheral edema  Musculoskeletal: No deformities, L knee pain, good ROM, trace pretibial edema.   Neuro: alert, non focal  Skin: Warm, no lesions or rashes   TTE 08/18/11:  - Left ventricle: The cavity size was normal. Wall thickness was normal. Systolic function was normal. The estimated ejection fraction was in the range of 55% to 60%. - Aortic valve: Mild regurgitation. - Mitral valve: Mild regurgitation. - Left atrium: The atrium was mildly dilated. - Right ventricle: The cavity size was moderately dilated. Systolic function was mildly reduced. - Right atrium: The atrium was mildly dilated. - Tricuspid valve: Moderate regurgitation. - Pulmonary arteries: Systolic pressure was severely increased. PA peak pressure: 63m Hg (S).  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 She was best controlled when she was on tyvaso, but stopped it at her request. She is non-compliant w LFT checks, and now I have learned that she is non-compliant w INR's at the anti-coag clinic.  - I am stopping Tracleer and coumadin now. Explained to her that I wouldn't give these meds to her if she couldn't do the labs.  - will repeat her TTE in 4 months, assess worsening in PASP, try to discuss the benefits of meds and compliance at that time.

## 2012-10-04 NOTE — Patient Instructions (Addendum)
Please STOP your coumadin now.  Please STOP Tracleer now.  We will check your echocardiogram in November 2014.  Follow with Dr Lamonte Sakai in November after the echocardiogram to review.

## 2012-10-04 NOTE — Addendum Note (Signed)
Addended by: Thedore Mins D on: 10/04/2012 12:29 PM   Modules accepted: Orders

## 2012-10-05 NOTE — Addendum Note (Signed)
Addended by: Thedore Mins D on: 10/05/2012 11:57 AM   Modules accepted: Orders, Medications

## 2012-10-07 ENCOUNTER — Other Ambulatory Visit: Payer: Self-pay | Admitting: Internal Medicine

## 2012-10-09 ENCOUNTER — Other Ambulatory Visit: Payer: Self-pay | Admitting: *Deleted

## 2012-10-09 MED ORDER — METOCLOPRAMIDE HCL 10 MG PO TABS: ORAL_TABLET | ORAL | Status: DC

## 2012-10-18 ENCOUNTER — Telehealth: Payer: Self-pay | Admitting: Emergency Medicine

## 2012-10-18 NOTE — Telephone Encounter (Signed)
Spoke to pt. She states that since stopping Coumadin and Tracleer that she has been having DOE. She is wanting to know if this is coming from stopping these medications.  RB - please advise. Thanks.

## 2012-10-19 ENCOUNTER — Telehealth: Payer: Self-pay | Admitting: Emergency Medicine

## 2012-10-19 ENCOUNTER — Ambulatory Visit: Payer: Medicare Other

## 2012-10-19 NOTE — Telephone Encounter (Signed)
I spoke with pt. She does want to restart the tracleer and stated she would be compliant with the appts and labs. Pt was seen 10/04/12 and not due back until November. Please advise RB thanks

## 2012-10-19 NOTE — Telephone Encounter (Signed)
Duplicate message. I have already spoken with pt regarding this after this note was taken. Will sign off

## 2012-10-19 NOTE — Telephone Encounter (Signed)
Yes, the SOB may be coming from the fact that we stopped the Tracleer. We can discuss restarting it if she would like, but that would mean she would need to be compliant with appointments and labwork.

## 2012-10-20 NOTE — Telephone Encounter (Signed)
Set her up for a sooner OV

## 2012-10-20 NOTE — Telephone Encounter (Signed)
Patient has been scheduled for 8/29 @ 1115am Nothing further needed at this time

## 2012-10-25 ENCOUNTER — Ambulatory Visit
Admission: RE | Admit: 2012-10-25 | Discharge: 2012-10-25 | Disposition: A | Discharge status: Home / Self Care | Payer: Medicare Other | Source: Ambulatory Visit

## 2012-10-25 DIAGNOSIS — Z1231 Encounter for screening mammogram for malignant neoplasm of breast: Secondary | ICD-10-CM

## 2012-11-01 ENCOUNTER — Other Ambulatory Visit: Payer: Self-pay | Admitting: Obstetrics & Gynecology

## 2012-11-01 DIAGNOSIS — R928 Other abnormal and inconclusive findings on diagnostic imaging of breast: Secondary | ICD-10-CM

## 2012-11-07 ENCOUNTER — Other Ambulatory Visit: Payer: Self-pay | Admitting: Internal Medicine

## 2012-11-10 ENCOUNTER — Encounter: Payer: Self-pay | Admitting: Emergency Medicine

## 2012-11-10 ENCOUNTER — Other Ambulatory Visit (INDEPENDENT_AMBULATORY_CARE_PROVIDER_SITE_OTHER): Payer: Medicare Other

## 2012-11-10 ENCOUNTER — Ambulatory Visit (INDEPENDENT_AMBULATORY_CARE_PROVIDER_SITE_OTHER): Payer: Medicare Other | Admitting: Emergency Medicine

## 2012-11-10 VITALS — BP 122/64 | HR 74 | Ht 64.0 in | Wt 168.8 lb

## 2012-11-10 DIAGNOSIS — I2789 Other specified pulmonary heart diseases: Secondary | ICD-10-CM

## 2012-11-10 DIAGNOSIS — M349 Systemic sclerosis, unspecified: Secondary | ICD-10-CM

## 2012-11-10 LAB — HEPATIC FUNCTION PANEL
Albumin: 3.7 g/dL (ref 3.5–5.2)
Bilirubin, Direct: 0.1 mg/dL (ref 0.0–0.3)
Total Protein: 8 g/dL (ref 6.0–8.3)

## 2012-11-10 MED ORDER — BOSENTAN 62.5 MG PO TABS: 62.5000 mg | ORAL_TABLET | Freq: Every day | ORAL | Status: DC

## 2012-11-10 NOTE — Assessment & Plan Note (Signed)
-  now off prednisone.

## 2012-11-10 NOTE — Patient Instructions (Addendum)
Blood work today and in 1 month We will restart Tracleer. Take 62.37m twice a day for the next month. We will discuss increasing at your next visit Follow with Dr BLamonte Sakaiin 1 month

## 2012-11-10 NOTE — Assessment & Plan Note (Signed)
She is interested in restarting the Tracleer. I underscored that we can do this but that she has to get her bloodwork reliably. She agrees.  - bloodwork today.  - start 62.5, increase to 125 when appropriate - rov 1 month

## 2012-11-10 NOTE — Progress Notes (Signed)
[name] is a 62 year old woman with scleroderma and severe secondary pulmonary hypertension. She had been treated with bosentan + Coumadin. Initiated [name], Adcirca in the past but unable to tolerate. Finally started on inhaled Tyvaso in 2010. Some delay in titrating up to goal dosing of 9 puffs 4x a day due to compliance issues, but finally able to do so. This was then stopped in Summer 2012 during admission for septic knee hardware.   By TTE, her PASP improved from 102 mmHg (7/09) to 61mHg (9/10), 6 minute walk improved to 3219m11/2010).   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 That hospitalization ultimately resulted in ICU stay for sepsis, c/b renal insufficiency. TTE on June 29 showed estimated RVSP of 63 mmHg (off Tyvaso). We sent her home on bosentan + coumadin, with plans to restart Tyvaso as outpt once she stabilized. She went home on IV Abx.   Readmitted 09/2010 with fever and leukocytosis, S Cr 1.8 - 2.0. Pulm/CCM consulted to assist w her management of PAH medications.  ROV 10/29/10 -- scleroderma, PAH. Has been hospitalized x3 since our last visit due to infected knee hardware. She is off tyvaso. She is planning to go back for repeat L knee surgery this month. She states that her breathing has been going well. She is on Tracleer. She does not want to restart the Tyvaso at this time. Denies dyspnea, syncope, CP.  She is finished with IV abx.   ROV 02/01/11 -- scleroderma, PAH. Remains on coumadin + bosentan but off Tyvaso as above. Last TTE on June 29 showed estimated RVSP of 63 mmHg (off Tyvaso).  She returns for f/u. Tells me that since last visit she underwent f/u knee sgy to replace hardware. She feels it is doing well, now off abx. She is not interested in going back on Tyvaso. Her breathing  seems to be stable, although she isn't really pushing herself.   ROV 06/21/11 -- scleroderma, PAH. Remains on coumadin + bosentan but off Tyvaso. She clinically feels well, has been able to increase her activity but PAP's have increased by TTE 09/11/10. We have planned for repeat TTE in June 2013. Remains on Pred 78m12md for her scleroderma. She asks today about whether a FXa inhibitor would be possible for her, but I don't know of any [facility]. Due for LFT's, wants to get them at coumadin clinic if possible, she will call to arrange.   ROV 10/04/11 -- scleroderma, PAH. Remains on coumadin + bosentan but off Tyvaso. TTE on 08/18/11 shows PASP rising, now 878m21m(from 63 08/2010; her best was 42 in 11/2008). Clinically, she feels well - no breathing complaints. Her wt has been stable, trace edema without change. She ambulates and does her usual activities, denies dyspnea.   ROV 12/13/11 -- scleroderma, PAH. Remains on coumadin + bosentan but off Tyvaso. TTE on 08/18/11 shows PASP rising, now 878mm70mfrom 63 08/2010; her best was 42 in 11/2008). Returns today to discuss Tyvaso - She is still against doing it due to the fact that it is time consuming, difficult to do 4-6 times a day. She isn't sure that it helps her breathing. Her TTE showed moderate RV dilation 6/13. Denies SOB, edema or CP.   ROV 10/04/12 -- scleroderma, PAH.  Remains on coumadin + bosentan but off Tyvaso. She has not had her TTE. Has not been compliant with INR checks at [facility]. Last LFT in my records 05/2012. She reports that she has been feeling well. No breathing limitations. She is asking to be off of coumadin, off of Tracleer.   ROV 11/10/12 -- scleroderma, PAH. She has been on Tyvaso before, stopped due to difficulty administering. I stopped her Tracleer and coumadin 7/23 because she wouldn't get her labs checked reliably. She returns and states that she misses the Tracleer >> she has felt weaker, stayed in the bed,  more dyspnea, decreased functional capacity.    Exam Filed Vitals:   11/10/12 1126  BP: 122/64  Pulse: 74  Height: _0  (1.626 m)  Weight: 168 lb 12.8 oz (76.567 kg)  SpO2: 98%   Gen: Pleasant, well-nourished, in no distress,    ENT: No lesions,  mouth clear,  oropharynx clear, no postnasal drip  Neck: No JVD, no TMG, no carotid bruits  Lungs: No use of accessory muscles, no dullness to percussion, clear without rales or rhonchi  Cardiovascular: RRR, heart sounds normal, no murmur or gallops, no peripheral edema  Musculoskeletal: No deformities, L knee pain, good ROM, trace pretibial edema.   Neuro: alert, non focal  Skin: Warm, no lesions or rashes   TTE 08/18/11:  - Left ventricle: The cavity size was normal. Wall thickness was normal. Systolic function was normal. The estimated ejection fraction was in the range of 55% to 60%. - Aortic valve: Mild regurgitation. - Mitral valve: Mild regurgitation. - Left atrium: The atrium was mildly dilated. - Right ventricle: The cavity size was moderately dilated. Systolic function was mildly reduced. - Right atrium: The atrium was mildly dilated. - Tricuspid valve: Moderate regurgitation. - Pulmonary arteries: Systolic pressure was severely increased. PA peak pressure: 103m Hg (S).  Impression:  PULMONARY HYPERTENSION, SECONDARY She is interested in restarting the Tracleer. I underscored that we can do this but that she has to get her bloodwork reliably. She agrees.  - bloodwork today.  - start 62.5, increase to 125 when appropriate - rov 1 month  SCLERODERMA - now off prednisone.

## 2012-11-17 NOTE — Progress Notes (Signed)
Quick Note:  ATC patient, no answer Phone rang multiple times with no option to leave VM WCB  Patients enrollment form faxed 11/14/12 and per [facility] she has not heard anything from them yet ______

## 2012-11-22 ENCOUNTER — Other Ambulatory Visit: Payer: Self-pay | Admitting: Obstetrics & Gynecology

## 2012-11-22 ENCOUNTER — Ambulatory Visit
Admission: RE | Admit: 2012-11-22 | Discharge: 2012-11-22 | Disposition: A | Discharge status: Home / Self Care | Payer: Medicare Other | Source: Ambulatory Visit | Attending: Obstetrics & Gynecology | Admitting: Obstetrics & Gynecology

## 2012-11-22 DIAGNOSIS — R928 Other abnormal and inconclusive findings on diagnostic imaging of breast: Secondary | ICD-10-CM

## 2012-12-04 ENCOUNTER — Ambulatory Visit
Admission: RE | Admit: 2012-12-04 | Discharge: 2012-12-04 | Disposition: A | Discharge status: Home / Self Care | Payer: Medicare Other | Source: Ambulatory Visit | Attending: Obstetrics & Gynecology | Admitting: Obstetrics & Gynecology

## 2012-12-04 DIAGNOSIS — R928 Other abnormal and inconclusive findings on diagnostic imaging of breast: Secondary | ICD-10-CM

## 2012-12-08 ENCOUNTER — Ambulatory Visit: Payer: Medicare Other | Admitting: Emergency Medicine

## 2012-12-20 ENCOUNTER — Other Ambulatory Visit: Payer: Self-pay | Admitting: *Deleted

## 2012-12-20 ENCOUNTER — Telehealth: Payer: Self-pay | Admitting: *Deleted

## 2012-12-20 DIAGNOSIS — E119 Type 2 diabetes mellitus without complications: Secondary | ICD-10-CM

## 2012-12-20 MED ORDER — METFORMIN HCL 500 MG PO TABS: 500.0000 mg | ORAL_TABLET | Freq: Two times a day (BID) | ORAL | Status: DC

## 2012-12-20 NOTE — Telephone Encounter (Signed)
[name] called and said she was looking for her metformin since last week. Her pharmacy said the contacted [place]. Please find out what's going on and give her a call.

## 2012-12-22 ENCOUNTER — Telehealth: Payer: Self-pay | Admitting: Emergency Medicine

## 2012-12-22 DIAGNOSIS — I2789 Other specified pulmonary heart diseases: Secondary | ICD-10-CM

## 2012-12-22 NOTE — Telephone Encounter (Signed)
[facility] is calling for a refill of the tracleer.  Pts last ov with RB was 11/10/2012.  She was restarted on the tracleer and told to follow up in 1 month so RB could discuss the dosage with her.  Her next ov is 11/5.  Is it ok to call in the refill to [facility]?  Please advise. Thanks   Allergies  Allergen Reactions  . Cephalexin   . Ciprofloxacin   . Codeine     REACTION: GI upset  . Contrast Media [Iodinated Diagnostic Agents]   . Iohexol      Code: HIVES, Desc: pt breaks out in large hives. needs full premeds, Onset Date: [id number]

## 2012-12-22 NOTE — Telephone Encounter (Signed)
Yes its ok to refill. She MUST come to her ROV

## 2012-12-25 ENCOUNTER — Other Ambulatory Visit: Payer: Medicare Other

## 2012-12-25 DIAGNOSIS — I2789 Other specified pulmonary heart diseases: Secondary | ICD-10-CM

## 2012-12-25 DIAGNOSIS — Z7901 Long term (current) use of anticoagulants: Secondary | ICD-10-CM

## 2012-12-25 NOTE — Telephone Encounter (Signed)
Spoke with [name], pharmacist at [facility] and she states that their records show that pt is Tracleer 125 mg bid.  Last ov note from 11-10-12 shows pt restarting Tracleer 62.5 mg bid.  Called to clarify dose with pt.  Unable to leave message.  Will try back later.

## 2012-12-26 NOTE — Telephone Encounter (Signed)
Called pt to clarify dose.  No answer and no option to leave msg.  WCB

## 2012-12-27 ENCOUNTER — Telehealth: Payer: Self-pay | Admitting: Emergency Medicine

## 2012-12-27 NOTE — Telephone Encounter (Signed)
Duplicate message. See phone note 12/26/12

## 2012-12-27 NOTE — Telephone Encounter (Signed)
ATC PT line rang several times and no VM and no answer WCB

## 2012-12-27 NOTE — Telephone Encounter (Signed)
Duplicate message. See phone note 12/26/12 regarding same

## 2012-12-27 NOTE — Telephone Encounter (Signed)
Patient Instructions    Blood work today and in 1 month  We will restart Tracleer. Take 62.75m twice a day for the next month. We will discuss increasing at your next visit  Follow with Dr BLamonte Sakaiin 1 month  --  I called [facility] to give verbal order for what RB prescribed for pt. I spoke with Aisha and gave VO for pt medication. Nothing further needed

## 2013-01-17 ENCOUNTER — Encounter: Payer: Self-pay | Admitting: Emergency Medicine

## 2013-01-17 ENCOUNTER — Ambulatory Visit (INDEPENDENT_AMBULATORY_CARE_PROVIDER_SITE_OTHER): Payer: Medicare Other | Admitting: Emergency Medicine

## 2013-01-17 VITALS — BP 128/80 | HR 64 | Ht 64.0 in | Wt 170.8 lb

## 2013-01-17 DIAGNOSIS — I2789 Other specified pulmonary heart diseases: Secondary | ICD-10-CM

## 2013-01-17 NOTE — Progress Notes (Signed)
[name] is a 62 year old woman with scleroderma and severe secondary pulmonary hypertension. She had been treated with bosentan + Coumadin. Initiated [name], Adcirca in the past but unable to tolerate. Finally started on inhaled Tyvaso in 2010. Some delay in titrating up to goal dosing of 9 puffs 4x a day due to compliance issues, but finally able to do so. This was then stopped in Summer 2012 during admission for septic knee hardware.   By TTE, her PASP improved from 102 mmHg (7/09) to 61mHg (9/10), 6 minute walk improved to 3219m11/2010).   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 That hospitalization ultimately resulted in ICU stay for sepsis, c/b renal insufficiency. TTE on June 29 showed estimated RVSP of 63 mmHg (off Tyvaso). We sent her home on bosentan + coumadin, with plans to restart Tyvaso as outpt once she stabilized. She went home on IV Abx.   Readmitted 09/2010 with fever and leukocytosis, S Cr 1.8 - 2.0. Pulm/CCM consulted to assist w her management of PAH medications.  ROV 10/29/10 -- scleroderma, PAH. Has been hospitalized x3 since our last visit due to infected knee hardware. She is off tyvaso. She is planning to go back for repeat L knee surgery this month. She states that her breathing has been going well. She is on Tracleer. She does not want to restart the Tyvaso at this time. Denies dyspnea, syncope, CP.  She is finished with IV abx.   ROV 02/01/11 -- scleroderma, PAH. Remains on coumadin + bosentan but off Tyvaso as above. Last TTE on June 29 showed estimated RVSP of 63 mmHg (off Tyvaso).  She returns for f/u. Tells me that since last visit she underwent f/u knee sgy to replace hardware. She feels it is doing well, now off abx. She is not interested in going back on Tyvaso. Her breathing  seems to be stable, although she isn't really pushing herself.   ROV 06/21/11 -- scleroderma, PAH. Remains on coumadin + bosentan but off Tyvaso. She clinically feels well, has been able to increase her activity but PAP's have increased by TTE 09/11/10. We have planned for repeat TTE in June 2013. Remains on Pred 78m12md for her scleroderma. She asks today about whether a FXa inhibitor would be possible for her, but I don't know of any studies [facility] these meds [facility]. Due for LFT's, wants to get them at coumadin clinic if possible, she will call to arrange.   ROV 10/04/11 -- scleroderma, PAH. Remains on coumadin + bosentan but off Tyvaso. TTE on 08/18/11 shows PASP rising, now 878m21m(from 63 08/2010; her best was 42 in 11/2008). Clinically, she feels well - no breathing complaints. Her wt has been stable, trace edema without change. She ambulates and does her usual activities, denies dyspnea.   ROV 12/13/11 -- scleroderma, PAH. Remains on coumadin + bosentan but off Tyvaso. TTE on 08/18/11 shows PASP rising, now 878mm70mfrom 63 08/2010; her best was 42 in 11/2008). Returns today to discuss Tyvaso - She is still against doing it due to the fact that it is time consuming, difficult to do 4-6 times a day. She isn't sure that it helps her breathing. Her TTE showed moderate RV dilation 6/13. Denies SOB, edema or CP.   ROV 10/04/12 -- scleroderma, PAH.  Remains on coumadin + bosentan but off Tyvaso. She has not had her TTE. Has not been compliant with INR checks at [facility]. Last LFT in my records 05/2012. She reports that she has been feeling well. No breathing limitations. She is asking to be off of coumadin, off of Tracleer.   ROV 11/10/12 -- scleroderma, PAH. She has been on Tyvaso before, stopped due to difficulty administering. I stopped her Tracleer and coumadin 7/23 because she wouldn't get her labs checked reliably. She returns and states that she misses the Tracleer >> she has felt weaker, stayed in the bed,  more dyspnea, decreased functional capacity.   ROV 01/17/13 -- scleroderma, PAH. She has been on Tyvaso before, stopped due to difficulty administering. I stopped her bosentan due to non-compliance with LFT's. She missed it so I restarted 62.98m bid 8/29. I didn't restart the coumadin. She returns without any LFT since 11/10/12. I discussed macitentan with her today. I have recommended switching since LFT's don't have to be followed.    Exam Filed Vitals:   01/17/13 1133  BP: 128/80  Pulse: 64  Height: _0  (1.626 m)  Weight: 170 lb 12.8 oz (77.474 kg)  SpO2: 97%   Gen: Pleasant, well-nourished, in no distress,    ENT: No lesions,  mouth clear,  oropharynx clear, no postnasal drip  Neck: No JVD, no TMG, no carotid bruits  Lungs: No use of accessory muscles, no dullness to percussion, clear without rales or rhonchi  Cardiovascular: RRR, heart sounds normal, no murmur or gallops, no peripheral edema  Musculoskeletal: No deformities, L knee pain, good ROM, trace pretibial edema.   Neuro: alert, non focal  Skin: Warm, no lesions or rashes   TTE 08/18/11:  - Left ventricle: The cavity size was normal. Wall thickness was normal. Systolic function was normal. The estimated ejection fraction was in the range of 55% to 60%. - Aortic valve: Mild regurgitation. - Mitral valve: Mild regurgitation. - Left atrium: The atrium was mildly dilated. - Right ventricle: The cavity size was moderately dilated. Systolic function was mildly reduced. - Right atrium: The atrium was mildly dilated. - Tricuspid valve: Moderate regurgitation. - Pulmonary arteries: Systolic pressure was severely increased. PA peak pressure: 831mHg (S).  Impression:  PULMONARY HYPERTENSION, SECONDARY Given her difficulty getting lab work reliably I would like to change bosentan to macitentan. We will start the paperwork to make a change now. I will check LFTs today. Discussed Coumadin with her today. Will consider  restarting in the future but again I am worried about her compliance with getting labs checked. Will likely need repeat her echocardiogram several months after the new medication he is on board.

## 2013-01-17 NOTE — Patient Instructions (Signed)
We will stop Tracleer today We will do the paperwork to start Opsumit Blood work today Follow with Dr Lamonte Sakai next available

## 2013-01-17 NOTE — Assessment & Plan Note (Signed)
Given her difficulty getting lab work reliably I would like to change bosentan to macitentan. We will start the paperwork to make a change now. I will check LFTs today. Discussed Coumadin with her today. Will consider restarting in the future but again I am worried about her compliance with getting labs checked. Will likely need repeat her echocardiogram several months after the new medication he is on board.

## 2013-01-18 ENCOUNTER — Other Ambulatory Visit: Payer: Self-pay | Admitting: Internal Medicine

## 2013-01-18 ENCOUNTER — Telehealth: Payer: Self-pay | Admitting: Emergency Medicine

## 2013-01-18 NOTE — Telephone Encounter (Signed)
This has been documented in chart and will forward to Dr. B as an Juluis Rainier

## 2013-01-22 NOTE — Telephone Encounter (Signed)
Thank you

## 2013-01-24 ENCOUNTER — Other Ambulatory Visit (HOSPITAL_COMMUNITY): Payer: Self-pay | Admitting: Emergency Medicine

## 2013-01-24 ENCOUNTER — Ambulatory Visit (HOSPITAL_COMMUNITY): Payer: Medicare Other | Attending: Cardiology | Admitting: Radiology

## 2013-01-24 ENCOUNTER — Encounter: Payer: Self-pay | Admitting: Cardiology

## 2013-01-24 DIAGNOSIS — R0602 Shortness of breath: Secondary | ICD-10-CM

## 2013-01-24 DIAGNOSIS — I272 Pulmonary hypertension, unspecified: Secondary | ICD-10-CM

## 2013-01-24 DIAGNOSIS — I2789 Other specified pulmonary heart diseases: Secondary | ICD-10-CM | POA: Insufficient documentation

## 2013-01-24 DIAGNOSIS — R0609 Other forms of dyspnea: Secondary | ICD-10-CM | POA: Insufficient documentation

## 2013-01-24 DIAGNOSIS — R0989 Other specified symptoms and signs involving the circulatory and respiratory systems: Secondary | ICD-10-CM | POA: Insufficient documentation

## 2013-01-24 DIAGNOSIS — M349 Systemic sclerosis, unspecified: Secondary | ICD-10-CM | POA: Insufficient documentation

## 2013-01-24 DIAGNOSIS — I079 Rheumatic tricuspid valve disease, unspecified: Secondary | ICD-10-CM | POA: Insufficient documentation

## 2013-01-24 NOTE — Progress Notes (Signed)
Echocardiogram performed.

## 2013-01-29 ENCOUNTER — Telehealth: Payer: Self-pay | Admitting: Emergency Medicine

## 2013-01-29 NOTE — Telephone Encounter (Signed)
Spoke to opsum[facility] py has been approved and her case worker will call her and then get meds sent out to her Joellen Jersey pt is aware

## 2013-01-29 NOTE — Telephone Encounter (Signed)
I spoke with [name]. She has not received her Opsumit yet. She is checking on the status of this. According to 01/18/13 phone note this was approved per [facility]. Is there a # we can call [facility]? thanks

## 2013-01-29 NOTE — Telephone Encounter (Signed)
Thanks

## 2013-02-12 ENCOUNTER — Other Ambulatory Visit: Payer: Self-pay | Admitting: Internal Medicine

## 2013-02-21 ENCOUNTER — Encounter: Payer: Self-pay | Admitting: Emergency Medicine

## 2013-02-21 ENCOUNTER — Ambulatory Visit (INDEPENDENT_AMBULATORY_CARE_PROVIDER_SITE_OTHER): Payer: Medicare Other | Admitting: Emergency Medicine

## 2013-02-21 VITALS — BP 140/74 | HR 55 | Ht 64.0 in | Wt 172.2 lb

## 2013-02-21 DIAGNOSIS — M349 Systemic sclerosis, unspecified: Secondary | ICD-10-CM

## 2013-02-21 DIAGNOSIS — I2789 Other specified pulmonary heart diseases: Secondary | ICD-10-CM

## 2013-02-21 NOTE — Addendum Note (Signed)
Addended by: Carlos American A on: 02/21/2013 12:50 PM   Modules accepted: Orders

## 2013-02-21 NOTE — Assessment & Plan Note (Signed)
Appears to be tolerating macitentan 69m. PASP on most recent TTE (before new therapy) is stable but severely high.  - continue the macitentan - repeat TTE next Summer.  - rov 3 months

## 2013-02-21 NOTE — Progress Notes (Signed)
[name] is a 62 year old woman with scleroderma and severe secondary pulmonary hypertension. She had been treated with bosentan + Coumadin. Initiated [name], Adcirca in the past but unable to tolerate. Finally started on inhaled Tyvaso in 2010. Some delay in titrating up to goal dosing of 9 puffs 4x a day due to compliance issues, but finally able to do so. This was then stopped in Summer 2012 during admission for septic knee hardware.   By TTE, her PASP improved from 102 mmHg (7/09) to 61mHg (9/10), 6 minute walk improved to 3219m11/2010).   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 That hospitalization ultimately resulted in ICU stay for sepsis, c/b renal insufficiency. TTE on June 29 showed estimated RVSP of 63 mmHg (off Tyvaso). We sent her home on bosentan + coumadin, with plans to restart Tyvaso as outpt once she stabilized. She went home on IV Abx.   Readmitted 09/2010 with fever and leukocytosis, S Cr 1.8 - 2.0. Pulm/CCM consulted to assist w her management of PAH medications.  ROV 10/29/10 -- scleroderma, PAH. Has been hospitalized x3 since our last visit due to infected knee hardware. She is off tyvaso. She is planning to go back for repeat L knee surgery this month. She states that her breathing has been going well. She is on Tracleer. She does not want to restart the Tyvaso at this time. Denies dyspnea, syncope, CP.  She is finished with IV abx.   ROV 02/01/11 -- scleroderma, PAH. Remains on coumadin + bosentan but off Tyvaso as above. Last TTE on June 29 showed estimated RVSP of 63 mmHg (off Tyvaso).  She returns for f/u. Tells me that since last visit she underwent f/u knee sgy to replace hardware. She feels it is doing well, now off abx. She is not interested in going back on Tyvaso. Her breathing  seems to be stable, although she isn't really pushing herself.   ROV 06/21/11 -- scleroderma, PAH. Remains on coumadin + bosentan but off Tyvaso. She clinically feels well, has been able to increase her activity but PAP's have increased by TTE 09/11/10. We have planned for repeat TTE in June 2013. Remains on Pred 78m12md for her scleroderma. She asks today about whether a FXa inhibitor would be possible for her, but I don't know of any studies on these meds [facility]. Due for LFT's, wants to get them at coumadin clinic if possible, she will call to arrange.   ROV 10/04/11 -- scleroderma, PAH. Remains on coumadin + bosentan but off Tyvaso. TTE on 08/18/11 shows PASP rising, now 878m21m(from 63 08/2010; her best was 42 in 11/2008). Clinically, she feels well - no breathing complaints. Her wt has been stable, trace edema without change. She ambulates and does her usual activities, denies dyspnea.   ROV 12/13/11 -- scleroderma, PAH. Remains on coumadin + bosentan but off Tyvaso. TTE on 08/18/11 shows PASP rising, now 878mm70mfrom 63 08/2010; her best was 42 in 11/2008). Returns today to discuss Tyvaso - She is still against doing it due to the fact that it is time consuming, difficult to do 4-6 times a day. She isn't sure that it helps her breathing. Her TTE showed moderate RV dilation 6/13. Denies SOB, edema or CP.   ROV 10/04/12 -- scleroderma, PAH.  Remains on coumadin + bosentan but off Tyvaso. She has not had her TTE. Has not been compliant with INR checks at [facility]. Last LFT in my records 05/2012. She reports that she has been feeling well. No breathing limitations. She is asking to be off of coumadin, off of Tracleer.   ROV 11/10/12 -- scleroderma, PAH. She has been on Tyvaso before, stopped due to difficulty administering. I stopped her Tracleer and coumadin 7/23 because she wouldn't get her labs checked reliably. She returns and states that she misses the Tracleer >> she has felt weaker, stayed in the bed,  more dyspnea, decreased functional capacity.   ROV 01/17/13 -- scleroderma, PAH. She has been on Tyvaso before, stopped due to difficulty administering. I stopped her bosentan due to non-compliance with LFT's. She missed it so I restarted 62.33m bid 8/29. I didn't restart the coumadin. She returns without any LFT since 11/10/12. I discussed macitentan with her today. I have recommended switching since LFT's don't have to be followed.   ROV 02/21/13 -- hx scleroderma, PAH. Has been on and off several PAH meds and also coumadin as detailed above. We started macitentan last time. She is currently taking at night - 165m She was having nausea and diarrhea at first, then this got better when she switched to taking at night. Her breathing is stable, maybe slightly better.  Her PASP on TTE 01/24/13 > stable at 8790m   Exam Filed Vitals:   02/21/13 1217  BP: 140/74  Pulse: 55  Height: _0  (1.626 m)  Weight: 172 lb 3.2 oz (78.109 kg)  SpO2: 95%   Gen: Pleasant, well-nourished, in no distress,    ENT: No lesions,  mouth clear,  oropharynx clear, no postnasal drip  Neck: No JVD, no TMG, no carotid bruits  Lungs: No use of accessory muscles, no dullness to percussion, clear without rales or rhonchi  Cardiovascular: RRR, heart sounds normal, no murmur or gallops, no peripheral edema  Musculoskeletal: No deformities, L knee pain, good ROM, trace pretibial edema.   Neuro: alert, non focal  Skin: Warm, no lesions or rashes   01/24/13 --  TTE Study Conclusions  - Left ventricle: The cavity size was normal. There was mild concentric hypertrophy. Systolic function was normal. The estimated ejection fraction was in the range of 60% to 65%. Wall motion was normal; there were no regional wall motion abnormalities. Left ventricular diastolic function parameters were normal. - Ventricular septum: The contour showed diastolic flattening and systolic flattening consistent with volume and pressure  overload. - Right ventricle: The cavity size was severely dilated. There was moderate hypertrophy. Systolic function was moderately reduced. The estimated peak pressure was 50m36m. - Right atrium: There is significant bowing of the interatrial septum to the left consistent with severely elavated right sided pressures. The atrium was massively dilated. - Tricuspid valve: Moderate regurgitation. - Pulmonary arteries: Systolic pressure was severely increased. PA peak pressure: 87mm28m(S). (Stable from 1 year ago)    Impression:  PULMONARY HYPERTENSION, SECONDARY Appears to be tolerating macitentan 10mg.78mP on most recent TTE (before new therapy) is stable but severely high.  - continue the macitentan - repeat TTE next Summer.  - rov 3 months  SCLERODERMA She has been taking prednisone prn for hand stiffness. I recommended that she should be on a stable regimen, should not take prn. She understands and agrees.

## 2013-02-21 NOTE — Assessment & Plan Note (Signed)
She has been taking prednisone prn for hand stiffness. I recommended that she should be on a stable regimen, should not take prn. She understands and agrees.

## 2013-02-21 NOTE — Patient Instructions (Signed)
Please continue your macitentan 54m daily Follow with Dr BLamonte Sakaiin 3 months We will plan to repeat your echocardiogram in Summer 2015

## 2013-03-13 ENCOUNTER — Other Ambulatory Visit: Payer: Self-pay | Admitting: Nurse Practitioner

## 2013-03-13 ENCOUNTER — Other Ambulatory Visit: Payer: Self-pay | Admitting: Internal Medicine

## 2013-04-11 ENCOUNTER — Other Ambulatory Visit: Payer: Self-pay | Admitting: Nurse Practitioner

## 2013-04-11 ENCOUNTER — Other Ambulatory Visit: Payer: Self-pay | Admitting: Internal Medicine

## 2013-04-12 ENCOUNTER — Other Ambulatory Visit: Payer: Self-pay | Admitting: Internal Medicine

## 2013-04-16 ENCOUNTER — Encounter: Payer: Self-pay | Admitting: Internal Medicine

## 2013-04-16 ENCOUNTER — Ambulatory Visit (INDEPENDENT_AMBULATORY_CARE_PROVIDER_SITE_OTHER): Payer: Medicare Other | Admitting: Internal Medicine

## 2013-04-16 VITALS — BP 106/59 | HR 83 | Temp 98.2°F | Wt 159.1 lb

## 2013-04-16 DIAGNOSIS — Z0000 Encounter for general adult medical examination without abnormal findings: Secondary | ICD-10-CM

## 2013-04-16 DIAGNOSIS — E1142 Type 2 diabetes mellitus with diabetic polyneuropathy: Secondary | ICD-10-CM

## 2013-04-16 DIAGNOSIS — E119 Type 2 diabetes mellitus without complications: Secondary | ICD-10-CM

## 2013-04-16 DIAGNOSIS — E1149 Type 2 diabetes mellitus with other diabetic neurological complication: Secondary | ICD-10-CM

## 2013-04-16 DIAGNOSIS — I10 Essential (primary) hypertension: Secondary | ICD-10-CM

## 2013-04-16 DIAGNOSIS — E781 Pure hyperglyceridemia: Secondary | ICD-10-CM

## 2013-04-16 LAB — COMPLETE METABOLIC PANEL WITH GFR
ALK PHOS: 113 U/L (ref 39–117)
ALT: 10 U/L (ref 0–35)
AST: 18 U/L (ref 0–37)
Albumin: 4.5 g/dL (ref 3.5–5.2)
BUN: 29 mg/dL — AB (ref 6–23)
CALCIUM: 9.9 mg/dL (ref 8.4–10.5)
CO2: 21 mEq/L (ref 19–32)
Chloride: 111 mEq/L (ref 96–112)
Creat: 1.14 mg/dL — ABNORMAL HIGH (ref 0.50–1.10)
GFR, EST NON AFRICAN AMERICAN: 52 mL/min — AB
GFR, Est African American: 60 mL/min
GLUCOSE: 84 mg/dL (ref 70–99)
Potassium: 5.8 mEq/L — ABNORMAL HIGH (ref 3.5–5.3)
Sodium: 140 mEq/L (ref 135–145)
Total Bilirubin: 0.6 mg/dL (ref 0.2–1.2)
Total Protein: 7.9 g/dL (ref 6.0–8.3)

## 2013-04-16 LAB — LIPID PANEL
CHOLESTEROL: 98 mg/dL (ref 0–200)
HDL: 31 mg/dL — ABNORMAL LOW (ref >39)
LDL Cholesterol: 48 mg/dL (ref 0–99)
Total CHOL/HDL Ratio: 3.2 Ratio
Triglycerides: 93 mg/dL (ref <150)
VLDL: 19 mg/dL (ref 0–40)

## 2013-04-16 LAB — HM DIABETES EYE EXAM

## 2013-04-16 LAB — GLUCOSE, CAPILLARY
Glucose-Capillary: 51 mg/dL — ABNORMAL LOW (ref 70–99)
Glucose-Capillary: 80 mg/dL (ref 70–99)

## 2013-04-16 LAB — POCT GLYCOSYLATED HEMOGLOBIN (HGB A1C): Hemoglobin A1C: 5

## 2013-04-16 MED ORDER — METFORMIN HCL 500 MG PO TABS: 500.0000 mg | ORAL_TABLET | Freq: Every day | ORAL | Status: DC

## 2013-04-16 NOTE — Patient Instructions (Signed)
General Instructions: Please schedule a follow up visit within the next 3-4 months (June).   For your medications:   Please bring all of your pill  Bottles with you to each visit.  This will help make sure that we have an up to date list of all the medications you are taking.  Please also bring any over the counter herbal medications you are taking (not including advil, tylenol, etc.)  Please continue taking all of your medications as prescribed by other providers.  Please continue lisinopril-hctz at the current dose as well as fenofibrate and fluoxetine.   Please DECREASE your metformin to 1 tablet in the morning only.   Please make sure to make a follow up appointment with your gynecologist Dr. Aleene Davidson to be seen sometime this year.  It is important that you keep up with this area of your health.   You have had some low bone mass before.  At our next visit we will discuss repeating a DEXA scan to check your bone mass!  Thank you!  Please call the clinic with any questions.    Treatment Goals:  Goals (1 Years of Data) as of 04/16/13     Lifestyle    . Prevent Falls       Progress Toward Treatment Goals:  Treatment Goal 04/16/2013  Hemoglobin A1C at goal  Blood pressure at goal    Self Care Goals & Plans:  Self Care Goal 04/16/2013  Manage my medications take my medicines as prescribed; refill my medications on time  Monitor my health keep track of my blood glucose; bring my glucose meter and log to each visit  Eat healthy foods eat foods that are low in salt; eat baked foods instead of fried foods; drink diet soda or water instead of juice or soda  Be physically active find an activity I enjoy    Home Blood Glucose Monitoring 04/16/2013  Check my blood sugar once a day  When to check my blood sugar before breakfast     Care Management & Community Referrals:  Referral 04/16/2013  Referrals made for care management support none needed  Referrals made to community resources  -

## 2013-04-16 NOTE — Addendum Note (Signed)
Addended by: Marcelino Duster on: 04/16/2013 11:16 AM   Modules accepted: Orders

## 2013-04-16 NOTE — Progress Notes (Signed)
Subjective:    Patient ID: [name], female    DOB: October 03, 1950, 63 y.o.   MRN: [id number]  CC: routine follow up.   HPI  [name] is a 63yo woman with PMH of DM2 with peripheral neuropathy, Hyper TG, HTN, secondary pHTN, GERD, Gastroparesis, scleroderma, DJD who is not on chronic anticoagulation who presents today for follow up.   Per chart review since I last saw her (April 2014) it appears that there has been concern with her medication/appointment compliance and some of her medications have been changed, see below.   Today [name] reports that she is doing okay.  She has been eating a little less in the morning but otherwise she is eating the same amount of food.  She notes a decreased appetite since stopping the prednisone.  No night sweats, no fevers.  No problems swallowing and with normal bowel movements.  No trouble taking medications.  She has lost 13 pounds since last being seen in December and she is not trying to lose weight.  She has had a recent bereavement which may explain some of her depressed affect.    She continues to follow with her other physicians.  She reports having a colonoscopy about 4 years ago and her recent mammogram was abnormal but biopsy was normal.  She has a hysterectomy for abnormal bleeding of the uterus, thinks she had fibroids.    For her DM, she is checking her BS only once a day and sporadically, which is likely okay.  Her A1C is 5.0 and she has decreased PO intake as noted above.  She is due for an eye exam.  Her foot exam is abnormal, she has a history of DM peripheral neuropathy.  She is on fenofibrate for Hypertriglyceridemia.   Otherwise she has no complaints.    She receives medications from multiple different providers and we reviewed them today - -  Medications -  Byrum (Pulmonary - Lung) - Tracleer (stopped) and warfarin (stopped) - stopped at pulmonary visit in July.  Tracleer restarted in August, changed to Platinum in  December Beekman (Rheumatology - Scleroderma) - Prednisone (stopped), reglan, vicoden, nexium  Krista Blue (Neurologist) - Lyrica, gabapentin  Harrington Challenger (Cardiology) - Lanoxin  Burns (eye)   From our clinic - Dr. Daryll Drown  Lisinopril-hctz 20-37m once daily  Fenofibrate 452monce daily  Metformin 50054mwo times per day  Fluoxetine 87m69mily  Current Outpatient Prescriptions on File Prior to Visit  Medication Sig Dispense Refill  . esomeprazole (NEXIUM) 40 MG capsule Take by mouth 2 (two) times daily.        . fenofibrate (TRICOR) 48 MG tablet take 1 tablet by mouth once daily  30 tablet  11  . FLUoxetine (PROZAC) 20 MG capsule take 1 capsule by mouth every morning  30 capsule  2  . gabapentin (NEURONTIN) 100 MG capsule take 1 capsule by mouth twice a day  60 capsule  6  . glucose blood test strip Use as instructed  100 each  12  . HYDROcodone-acetaminophen (NORCO) 10-325 MG per tablet 4 tablets every 4 (four) hours as needed.       . ibMarland Kitchenprofen (ADVIL) 200 MG tablet Take 200 mg by mouth every 6 (six) hours as needed.        . LAMarland KitchenOXIN 125 MCG tablet take 1 tablet by mouth once daily  30 tablet  2  . lisinopril-hydrochlorothiazide (PRINZIDE,ZESTORETIC) 20-25 MG per tablet take 1 tablet by mouth once daily  30  tablet  1  . LYRICA 150 MG capsule take 1 capsule by mouth three times a day  90 capsule  5  . Macitentan 10 MG TABS Take 1 tablet by mouth daily.      . metFORMIN (GLUCOPHAGE) 500 MG tablet Take 1 tablet (500 mg total) by mouth 2 (two) times daily with a meal.  180 tablet  4  . metoCLOPramide (REGLAN) 10 MG tablet Take 1/2 tablet by mouth before meals and at bedtime  180 tablet  1  . ONE TOUCH LANCETS MISC 1 Syringe by Does not apply route 3 (three) times daily. Check Blood Glucose 2-3 times a day.  200 each  3    Review of Systems  Constitutional: Negative for fever, chills, diaphoresis and fatigue.  HENT: Negative for ear discharge, ear pain and trouble swallowing.   Eyes: Negative for pain  and redness.  Respiratory: Negative for cough and shortness of breath.   Cardiovascular: Negative for chest pain and leg swelling.  Gastrointestinal: Negative for nausea, vomiting, abdominal pain and blood in stool.  Genitourinary: Negative for dysuria and difficulty urinating.  Musculoskeletal: Positive for arthralgias (chronic). Negative for back pain.  Skin: Negative for color change, pallor and rash.  Neurological: Negative for dizziness, light-headedness and headaches.  Psychiatric/Behavioral: Positive for confusion (sometimes can't remember things). Negative for decreased concentration.       Objective:   Physical Exam  Constitutional: She is oriented to person, place, and time. She appears well-developed and well-nourished. No distress.  HENT:  Head: Normocephalic and atraumatic.  Mouth/Throat: No oropharyngeal exudate.  Eyes: Pupils are equal, round, and reactive to light. No scleral icterus.  Neck: Neck supple.  Cardiovascular: Normal rate, regular rhythm, normal heart sounds and intact distal pulses.   No murmur heard. Pulmonary/Chest: Effort normal and breath sounds normal. No respiratory distress. She has no rales.  Abdominal: Soft. Bowel sounds are normal.  Musculoskeletal: She exhibits no edema and no tenderness.  Lymphadenopathy:    She has no cervical adenopathy.  Neurological: She is alert and oriented to person, place, and time.  Decreased sensation to bilateral feet, stable neuropathy from DM  Skin: Skin is warm and dry. No rash noted. No erythema.  Psychiatric:  Somewhat flattened affect, she cheers up when talking about her grandchildren.  This is not grossly abnormal for her    Labs today: CMET, FLP, A1C = 5.0      Assessment & Plan:  RTC in 3 months for further evaluation of her DM.    [name] also has scleroderma, 2nd pHTN which are followed by other providers.

## 2013-04-16 NOTE — Assessment & Plan Note (Signed)
Colonoscopy: Epic reports done in 2011, will attempt to get report PAP: Sees Dr. Aleene Davidson in Gynecology, she will need an appt.  She had a hysterectomy for benign reasons per patient but should continue to follow with gynecology.  MMG: Last done in 2014, abnormal, required biopsy which showed no malignancy.  Repeat in 11/2013 DEXA: 2010, borderline normal and osteopenia.  As she has been on long term steroids (off now).  She will need DEXA scan this year, will discuss at next visit.    Immunizations UTD

## 2013-04-16 NOTE — Assessment & Plan Note (Addendum)
Last LDL was 77, TG was 161.  Continue fenofibrate.  Check FLP today.   Update: LDL 48, TG < 100.  Stable.

## 2013-04-16 NOTE — Progress Notes (Signed)
Hypoglycemic Event  CBG: 51  Treatment: 15 GM carbohydrate snack  Symptoms: None  Follow-up CBG: [id number] CBG Result:80  Possible Reasons for Event: Inadequate meal intake  Comments/MD notified: MD notified     Nicoletta Dress  Remember to initiate Hypoglycemia Order Set & complete

## 2013-04-16 NOTE — Addendum Note (Signed)
Addended by: Resa Miner on: 04/16/2013 11:05 AM   Modules accepted: Orders

## 2013-04-16 NOTE — Assessment & Plan Note (Addendum)
BP Readings from Last 3 Encounters:  04/16/13 106/59  02/21/13 140/74  01/17/13 128/80    Lab Results  Component Value Date   NA 140 07/10/2012   K 5.0 07/10/2012   CREATININE 1.36* 07/10/2012    Assessment: Blood pressure control: controlled Progress toward BP goal:  at goal Comments: She is taking her lisinopril-hctz without issue  Plan: Medications:  continue current medications Educational resources provided: brochure Self management tools provided: home blood pressure logbook Other plans: Check CMET today for renal function.   Renal function stable, K elevated, have asked her to hold lisinopril until repeat K obtained.    Repeat K 5.0.  Continue to hold lisinopril until next visit (BP stable, low normal).  Will likely need to stop combo pill and start HCTZ on its own.   Will have her come in in April to be seen in the resident clinic for repeat labs and possible change in her medications.

## 2013-04-16 NOTE — Assessment & Plan Note (Signed)
Lab Results  Component Value Date   HGBA1C 5.0 04/16/2013   HGBA1C 5.4 07/10/2012   HGBA1C 5.8 04/10/2012     Assessment: Diabetes control: good control (HgbA1C at goal) Progress toward A1C goal:  at goal Comments: She had a low blood sugar in clinic this AM, had not eaten breakfast yet.   Plan: Medications:  Decrease metformin to 521m once daily Home glucose monitoring: Frequency: once a day Timing: before breakfast Instruction/counseling given: reminded to get eye exam, reminded to bring blood glucose meter & log to each visit, reminded to bring medications to each visit and discussed foot care Educational resources provided: brochure Self management tools provided: copy of home glucose meter download Other plans:   BP is well controlled with current therapy of lisinopril-hctz combo pill, renal function will be checked by CMET today (MAU/Cr not needed as on lisinopril), LDL last check was 77 at goal, foot exam is abnormal but stable in the setting of peripheral neuropathy, she is due for an eye exam which is being set up today.  Her BS log was examined, she has four readings which are in the 90s-100s.

## 2013-04-23 ENCOUNTER — Telehealth: Payer: Self-pay | Admitting: Internal Medicine

## 2013-04-23 DIAGNOSIS — E875 Hyperkalemia: Secondary | ICD-10-CM

## 2013-04-23 NOTE — Telephone Encounter (Signed)
Called and spoke with [name] regarding her recent blood work.  Confirmed identity with birthdate.   Her K was 5.8, mildly elevated, renal function improved.   [name] reports that she is not taking any OTC vitamins or potassium.  She has no potassium on her medication list.  She is taking lisinopril, which I have asked her to stop for the time being (her BP was well controlled at last visit).  I will place orders for a repeat bmet and she will come get her blood work today or tomorrow.   If it remains elevated, will possibly need admission depending on level.  If returned to normal or mildly elevated, will prescribe kayexalate.   Gilles Chiquito, MD

## 2013-04-24 ENCOUNTER — Other Ambulatory Visit (INDEPENDENT_AMBULATORY_CARE_PROVIDER_SITE_OTHER): Payer: Medicare Other

## 2013-04-24 ENCOUNTER — Telehealth: Payer: Self-pay | Admitting: *Deleted

## 2013-04-24 DIAGNOSIS — E875 Hyperkalemia: Secondary | ICD-10-CM

## 2013-04-24 LAB — BASIC METABOLIC PANEL
BUN: 28 mg/dL — ABNORMAL HIGH (ref 6–23)
CO2: 20 mEq/L (ref 19–32)
Calcium: 8.3 mg/dL — ABNORMAL LOW (ref 8.4–10.5)
Chloride: 108 mEq/L (ref 96–112)
Creat: 1.18 mg/dL — ABNORMAL HIGH (ref 0.50–1.10)
Glucose, Bld: 78 mg/dL (ref 70–99)
POTASSIUM: 5 meq/L (ref 3.5–5.3)
SODIUM: 141 meq/L (ref 135–145)

## 2013-04-24 NOTE — Telephone Encounter (Signed)
Excellent.  Thank you

## 2013-04-24 NOTE — Telephone Encounter (Signed)
Labs are back for pt, K+ = 5.0                                   BUN =28                                   CREAT= 1.18                                   CALCIUM= 8.3

## 2013-04-25 ENCOUNTER — Other Ambulatory Visit: Payer: Self-pay | Admitting: *Deleted

## 2013-04-26 MED ORDER — GLUCOSE BLOOD VI STRP: ORAL_STRIP | Status: DC

## 2013-04-26 MED ORDER — ONETOUCH LANCETS MISC: Status: DC

## 2013-05-02 ENCOUNTER — Telehealth: Payer: Self-pay | Admitting: *Deleted

## 2013-05-02 NOTE — Addendum Note (Signed)
Addended by: Truddie Crumble on: 05/02/2013 09:11 AM   Modules accepted: Orders

## 2013-05-02 NOTE — Telephone Encounter (Signed)
Thank you, that is perfect.

## 2013-05-02 NOTE — Telephone Encounter (Signed)
Pt called in asking for results of lab work done on 2/10.  She has not heard from [place]. I told her the her labs were improving and potassium was back down. She was given an appointment for 4/13 with resident. Will this be okay with you?

## 2013-05-09 ENCOUNTER — Ambulatory Visit: Payer: Medicare Other | Admitting: Internal Medicine

## 2013-05-10 ENCOUNTER — Ambulatory Visit: Payer: Medicare Other | Admitting: Internal Medicine

## 2013-05-13 ENCOUNTER — Other Ambulatory Visit: Payer: Self-pay | Admitting: Internal Medicine

## 2013-05-14 ENCOUNTER — Encounter: Payer: Self-pay | Admitting: Internal Medicine

## 2013-05-14 ENCOUNTER — Telehealth: Payer: Self-pay | Admitting: Emergency Medicine

## 2013-05-14 ENCOUNTER — Ambulatory Visit (INDEPENDENT_AMBULATORY_CARE_PROVIDER_SITE_OTHER): Payer: Medicare Other | Admitting: Internal Medicine

## 2013-05-14 VITALS — BP 108/64 | HR 74 | Ht 64.0 in | Wt 167.0 lb

## 2013-05-14 DIAGNOSIS — I10 Essential (primary) hypertension: Secondary | ICD-10-CM

## 2013-05-14 NOTE — Telephone Encounter (Signed)
Attempted to call x3 machine not working [employer]

## 2013-05-14 NOTE — Patient Instructions (Signed)
Your physician recommends that you schedule a follow-up appointment as needed.   Keep follow up with Dr. Lamonte Sakai.

## 2013-05-14 NOTE — Progress Notes (Addendum)
HPI Patient is a 63  year old with a history of scleroderma and pulmonary hypertension Patient seen in cardiology Last winter Since seen she says her breathing is unchanged  No CP NO presyncope or syncope Followed by R Byrum in Pulmonary.  Echo planned fore June. She stoppd taking coumadin  Did not come for f/u to clinic   It was no longer prescribed.     Allergies  Allergen Reactions  . Cephalexin   . Ciprofloxacin   . Codeine     REACTION: GI upset  . Contrast Media [Iodinated Diagnostic Agents]   . Iohexol      Code: HIVES, Desc: pt breaks out in large hives. needs full premeds, Onset Date: [id number]     Current Outpatient Prescriptions  Medication Sig Dispense Refill  . esomeprazole (NEXIUM) 40 MG capsule Take by mouth 2 (two) times daily.        . fenofibrate (TRICOR) 48 MG tablet take 1 tablet by mouth once daily  30 tablet  11  . FLUoxetine (PROZAC) 20 MG capsule take 1 capsule by mouth every morning  30 capsule  2  . gabapentin (NEURONTIN) 100 MG capsule take 1 capsule by mouth twice a day  60 capsule  6  . glucose blood test strip Check Blood Glucose 2-3 times a day.Dx Code:250.60.  100 each  3  . HYDROcodone-acetaminophen (NORCO) 10-325 MG per tablet 4 tablets every 4 (four) hours as needed.       Marland Kitchen ibuprofen (ADVIL) 200 MG tablet Take 200 mg by mouth every 6 (six) hours as needed.        Marland Kitchen LANOXIN 125 MCG tablet take 1 tablet by mouth once daily  30 tablet  2  . lisinopril-hydrochlorothiazide (PRINZIDE,ZESTORETIC) 20-25 MG per tablet take 1 tablet by mouth once daily  30 tablet  1  . LYRICA 150 MG capsule take 1 capsule by mouth three times a day  90 capsule  5  . Macitentan 10 MG TABS Take 1 tablet by mouth daily.      . metFORMIN (GLUCOPHAGE) 500 MG tablet Take 1 tablet (500 mg total) by mouth daily with breakfast.  90 tablet  4  . metoCLOPramide (REGLAN) 10 MG tablet Take 1/2 tablet by mouth before meals and at bedtime  180 tablet  1  . ONE TOUCH LANCETS MISC Check  Blood Glucose 2-3 times a day.Dx code: 250.60.  200 each  3   No current facility-administered medications for this visit.    Past Medical History  Diagnosis Date  . Secondary pulmonary hypertension     right heart cath 04/20/04  . Cough   . Allergic rhinitis, cause unspecified   . Type II or unspecified type diabetes mellitus without mention of complication, not stated as uncontrolled   . Degeneration of lumbar or lumbosacral intervertebral disc   . Esophageal reflux   . Systemic sclerosis   . Unspecified essential hypertension   . Rotator cuff tear   . Gastritis   . Sickle cell trait   . Arthritis   . Obesity   . Visual changes   . Scleroderma   . Diastolic dysfunction   . Trichomonas   . Vaginal bleeding   . Neuropathy     Past Surgical History  Procedure Laterality Date  . Partial hysterectomy  1983  . Tubal ligation    . Tonsillectomy    . Upper endoscopy with biopsy  03/10/2006  . Right shoulder arthroscopy, subacromial decompression    .  Cardiac catheterization  04/24/2004  . Replacement total knee  11/2000    Family History  Problem Relation Age of Onset  . Heart disease Mother   . Diabetes Mother   . Diabetes Sister     History   Social History  . Marital Status: Divorced    Spouse Name: N/A    Number of Children: 2  . Years of Education: 11   Occupational History  . Umemployed    Social History Main Topics  . Smoking status: Never Smoker   . Smokeless tobacco: Never Used  . Alcohol Use: No  . Drug Use: No  . Sexual Activity: Not on file   Other Topics Concern  . Not on file   Social History Narrative    FAMILY HISTORY:  Significant for coronary artery disease and diabetes    Review of Systems:  All systems reviewed.  They are negative to the above problem except as previously stated.  Vital Signs: BP 108/64  Pulse 74  Ht _0  (1.626 m)  Wt 167 lb (75.751 kg)  BMI 28.65 kg/m2  Physical Exam Patient is in NAD HEENT:   Normocephalic, atraumatic. EOMI, PERRLA.  Neck: JVP is normal.  No bruits.  Lungs: clear to auscultation. No rales no wheezes.  Heart: Regular rate and rhythm. Normal S1, S2 (increased P2). No S3.   No significant murmurs. PMI not displaced.  Abdomen:  Supple, nontender. Normal bowel sounds. No masses. No hepatomegaly.  Extremities:   Good distal pulses throughout. No lower extremity edema.  Musculoskeletal :moving all extremities.  Neuro:   alert and oriented x3.  CN II-XII grossly intact.  EKG  SR 74 bpm  First degree AV block  240 msec.  Low voltage  Nonspecific ST T wave changes.    Assessment and Plan: 1.  Pulmonary hypertension.  Patient does not want to take coumadin  Discussed risks/benefits.   No change. She will f/u with R Byrum   Will be available prn.   1.  Pulmonary hypertension.   Patient being followed in pulmonary Refer to that evaluation.  Symptoms appear stable. Volume status looks OK.   She did not do a walk test today.   Would continue on coumadin.  WIll check CBC  Yearly f/u her since seen in this clinic.  Otherwise f/u with R Byrum.

## 2013-05-15 NOTE — Telephone Encounter (Signed)
I spoke with the pt and she states that the macitentan 54m daily is causing her to have diarrhea all day. Pt denies any nausea or vomiting. She states she takes the medication with food each time but this does not help. Pt has an appt set for 05/29/13. Please advise. JRandolph Bing CMA Allergies  Allergen Reactions  . Cephalexin   . Ciprofloxacin   . Codeine     REACTION: GI upset  . Contrast Media [Iodinated Diagnostic Agents]   . Iohexol      Code: HIVES, Desc: pt breaks out in large hives. needs full premeds, Onset Date: [id number]

## 2013-05-16 NOTE — Telephone Encounter (Signed)
Ask her to stop it so we can see if the diarrhea goes away. Then we will follow up as planned

## 2013-05-17 NOTE — Telephone Encounter (Signed)
Advised pt to stop medication and see if diarrhea clears per RB's rec's.  Pt to call back in a few days and let us know if better after stopping. Nothing further needed at this time

## 2013-05-21 ENCOUNTER — Telehealth: Payer: Self-pay | Admitting: Emergency Medicine

## 2013-05-21 DIAGNOSIS — R197 Diarrhea, unspecified: Secondary | ICD-10-CM

## 2013-05-21 NOTE — Telephone Encounter (Signed)
atc line rang several times and no VM and no answer. wcb

## 2013-05-22 NOTE — Telephone Encounter (Signed)
I spoke with the pt and she states the diarrhea is getting better since stopping the medication but is not completely gone. She states she still goes several times a day and each time it is just brown water. Pt denies any abdominal pain, cramping, nausea, vomiting. Please advise. Hendricks Bing, CMA

## 2013-05-22 NOTE — Telephone Encounter (Signed)
I think she needs to come in to have C diff PCR done. She may need to go to the hospital lab to do this instead of our lab

## 2013-05-23 NOTE — Telephone Encounter (Signed)
Spoke with pt and advised of RB recommendations.  Pt will come by lab and pick up sample cup and instructions.

## 2013-05-24 ENCOUNTER — Other Ambulatory Visit: Payer: Medicare Other

## 2013-05-24 ENCOUNTER — Telehealth: Payer: Self-pay | Admitting: *Deleted

## 2013-05-24 ENCOUNTER — Other Ambulatory Visit: Payer: Self-pay | Admitting: Internal Medicine

## 2013-05-24 DIAGNOSIS — R197 Diarrhea, unspecified: Secondary | ICD-10-CM

## 2013-05-24 NOTE — Telephone Encounter (Signed)
Call to pt - pt instructed to stop taking medication until her next appt, per Dr Daryll Drown,  which is in April w/Dr [facility]; pt voiced understanding.

## 2013-05-24 NOTE — Telephone Encounter (Signed)
Message copied by Ebbie Latus on Thu May 24, 2013  5:19 PM ------      Message from: Gilles Chiquito B      Created: Thu May 24, 2013  4:57 PM      Regarding: Hold Lisinopril/hctz       [name] - -             When you get a chance, can you call [name] and ask her to continue to hold her lisinopril-hctz until she is next seen in clinic.  This is due to her elevated potassium.             Thanks            EBM ------

## 2013-05-25 ENCOUNTER — Telehealth: Payer: Self-pay | Admitting: Emergency Medicine

## 2013-05-25 LAB — CLOSTRIDIUM DIFFICILE BY PCR: Toxigenic C. Difficile by PCR: NOT DETECTED

## 2013-05-25 NOTE — Telephone Encounter (Signed)
Her C diff is still pending.  It sounds to me like she is sick enough that she needs to be seen. If not in our office then she needs to go to the ED.

## 2013-05-25 NOTE — Telephone Encounter (Signed)
Spoke with pt. Still reports severe diarrhea, bilateral leg edema and SOB. States that all of her symptoms started after she stopped taking Macitentan. Had C Diff test done. Would like the results of that if possible. Wants to know what to do about her diarrhea, leg edema and SOB.  RB - please advise. Thanks.

## 2013-05-25 NOTE — Telephone Encounter (Signed)
Attempted to call patient. No answer. Will try back later.

## 2013-05-25 NOTE — Telephone Encounter (Signed)
Thank you!

## 2013-05-25 NOTE — Telephone Encounter (Signed)
Pt advised and recommended she go to ER, no appts available her this afternoon. Pt states she will see. She states if she cant make it she will go to the ER, otherwise she wants to wait until OV with RB on tuesday. I again advised to please go to ER. Pt states "she will see." Omena Bing, CMA

## 2013-05-29 ENCOUNTER — Ambulatory Visit (INDEPENDENT_AMBULATORY_CARE_PROVIDER_SITE_OTHER): Payer: Medicare Other | Admitting: Emergency Medicine

## 2013-05-29 ENCOUNTER — Encounter: Payer: Self-pay | Admitting: Emergency Medicine

## 2013-05-29 ENCOUNTER — Other Ambulatory Visit (INDEPENDENT_AMBULATORY_CARE_PROVIDER_SITE_OTHER): Payer: Medicare Other

## 2013-05-29 VITALS — BP 128/88 | HR 92 | Ht 64.0 in | Wt 175.0 lb

## 2013-05-29 DIAGNOSIS — R197 Diarrhea, unspecified: Secondary | ICD-10-CM

## 2013-05-29 DIAGNOSIS — M349 Systemic sclerosis, unspecified: Secondary | ICD-10-CM

## 2013-05-29 DIAGNOSIS — I2789 Other specified pulmonary heart diseases: Secondary | ICD-10-CM

## 2013-05-29 DIAGNOSIS — K3184 Gastroparesis: Secondary | ICD-10-CM

## 2013-05-29 LAB — BASIC METABOLIC PANEL
BUN: 23 mg/dL (ref 6–23)
CALCIUM: 6.5 mg/dL — AB (ref 8.4–10.5)
CO2: 20 meq/L (ref 19–32)
CREATININE: 1.6 mg/dL — AB (ref 0.4–1.2)
Chloride: 109 mEq/L (ref 96–112)
GFR: 43.62 mL/min — ABNORMAL LOW (ref >60.00)
Glucose, Bld: 102 mg/dL — ABNORMAL HIGH (ref 70–99)
Potassium: 4.7 mEq/L (ref 3.5–5.1)
Sodium: 139 mEq/L (ref 135–145)

## 2013-05-29 NOTE — Patient Instructions (Signed)
Stop reglan (metaclopromide) Take lasix for the next 3 days as directed Lab work today Use the loperamide 99m for diarrhea first. If you have more diarrhea you may use 261mmore (do NOT exceed 2075motal in a day) We will not restart macitentan right now Follow with Dr ByrLamonte Sakai 1 week

## 2013-05-29 NOTE — Assessment & Plan Note (Signed)
Going to hold

## 2013-05-29 NOTE — Progress Notes (Signed)
[name] is a 63 year old woman with scleroderma and severe secondary pulmonary hypertension. She had been treated with bosentan + Coumadin. Initiated [name], Adcirca in the past but unable to tolerate. Finally started on inhaled Tyvaso in 2010. Some delay in titrating up to goal dosing of 9 puffs 4x a day due to compliance issues, but finally able to do so. This was then stopped in Summer 2012 during admission for septic knee hardware.   By TTE, her PASP improved from 102 mmHg (7/09) to 61mHg (9/10), 6 minute walk improved to 3219m11/2010).   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 That hospitalization ultimately resulted in ICU stay for sepsis, c/b renal insufficiency. TTE on June 29 showed estimated RVSP of 63 mmHg (off Tyvaso). We sent her home on bosentan + coumadin, with plans to restart Tyvaso as outpt once she stabilized. She went home on IV Abx.   Readmitted 09/2010 with fever and leukocytosis, S Cr 1.8 - 2.0. Pulm/CCM consulted to assist w her management of PAH medications.  ROV 10/29/10 -- scleroderma, PAH. Has been hospitalized x3 since our last visit due to infected knee hardware. She is off tyvaso. She is planning to go back for repeat L knee surgery this month. She states that her breathing has been going well. She is on Tracleer. She does not want to restart the Tyvaso at this time. Denies dyspnea, syncope, CP.  She is finished with IV abx.   ROV 02/01/11 -- scleroderma, PAH. Remains on coumadin + bosentan but off Tyvaso as above. Last TTE on June 29 showed estimated RVSP of 63 mmHg (off Tyvaso).  She returns for f/u. Tells me that since last visit she underwent f/u knee sgy to replace hardware. She feels it is doing well, now off abx. She is not interested in going back on Tyvaso. Her breathing  seems to be stable, although she isn't really pushing herself.   ROV 06/21/11 -- scleroderma, PAH. Remains on coumadin + bosentan but off Tyvaso. She clinically feels well, has been able to increase her activity but PAP's have increased by TTE 09/11/10. We have planned for repeat TTE in June 2013. Remains on Pred 78m12md for her scleroderma. She asks today about whether a FXa inhibitor would be possible for her, but I don't know of any [facility]. Due for LFT's, wants to get them at coumadin clinic if possible, she will call to arrange.   ROV 10/04/11 -- scleroderma, PAH. Remains on coumadin + bosentan but off Tyvaso. TTE on 08/18/11 shows PASP rising, now 878m21m(from 63 08/2010; her best was 42 in 11/2008). Clinically, she feels well - no breathing complaints. Her wt has been stable, trace edema without change. She ambulates and does her usual activities, denies dyspnea.   ROV 12/13/11 -- scleroderma, PAH. Remains on coumadin + bosentan but off Tyvaso. TTE on 08/18/11 shows PASP rising, now 878mm70mfrom 63 08/2010; her best was 42 in 11/2008). Returns today to discuss Tyvaso - She is still against doing it due to the fact that it is time consuming, difficult to do 4-6 times a day. She isn't sure that it helps her breathing. Her TTE showed moderate RV dilation 6/13. Denies SOB, edema or CP.   ROV 10/04/12 -- scleroderma, PAH.  Remains on coumadin + bosentan but off Tyvaso. She has not had her TTE. Has not been compliant with INR checks at [facility]. Last LFT in my records 05/2012. She reports that she has been feeling well. No breathing limitations. She is asking to be off of coumadin, off of Tracleer.   ROV 11/10/12 -- scleroderma, PAH. She has been on Tyvaso before, stopped due to difficulty administering. I stopped her Tracleer and coumadin 7/23 because she wouldn't get her labs checked reliably. She returns and states that she misses the Tracleer >> she has felt weaker, stayed in the bed,  more dyspnea, decreased functional capacity.   ROV 01/17/13 -- scleroderma, PAH. She has been on Tyvaso before, stopped due to difficulty administering. I stopped her bosentan due to non-compliance with LFT's. She missed it so I restarted 62.17m bid 8/29. I didn't restart the coumadin. She returns without any LFT since 11/10/12. I discussed macitentan with her today. I have recommended switching since LFT's don't have to be followed.   ROV 02/21/13 -- hx scleroderma, PAH. Has been on and off several PAH meds and also coumadin as detailed above. We started macitentan last time. She is currently taking at night - 17m She was having nausea and diarrhea at first, then this got better when she switched to taking at night. Her breathing is stable, maybe slightly better.  Her PASP on TTE 01/24/13 > stable at 8771m  ROV 05/29/13 -- follows up for scleroderma, PAH. Has been on and off several PAH meds and also coumadin. I stopped macitentan beginning of March due to persistent diarrhea. Since then she has continued to have diarrhea. Her breathing has worsened since we stopped the macitentan and she has more edema. She stopped lisinopril/HCTZ a week ago by PCP due to hyperkalemia. She is due for repeat TTE in June. C diff was negative. She is taking loperamide prn. Still on reglan.     Exam Filed Vitals:   05/29/13 1132  BP: 128/88  Pulse: 92  Height: _0  (1.626 m)  Weight: 175 lb (79.379 kg)  SpO2: 96%   Gen: Pleasant, well-nourished, in no distress,    ENT: No lesions,  mouth clear,  oropharynx clear, no postnasal drip  Neck: No JVD, no TMG, no carotid bruits  Lungs: No use of accessory muscles, no dullness to percussion, clear without rales or rhonchi  Cardiovascular: RRR, heart sounds normal, no murmur or gallops, no peripheral edema  Musculoskeletal: No deformities, 1-2+ pretibial edema.   Neuro: alert, non focal  Skin: Warm, no lesions or rashes   01/24/13 --  TTE Study  Conclusions  - Left ventricle: The cavity size was normal. There was mild concentric hypertrophy. Systolic function was normal. The estimated ejection fraction was in the range of 60% to 65%. Wall motion was normal; there were no regional wall motion abnormalities. Left ventricular diastolic function parameters were normal. - Ventricular septum: The contour showed diastolic flattening and systolic flattening consistent with volume and pressure overload. - Right ventricle: The cavity size was severely dilated. There was moderate hypertrophy. Systolic function was moderately reduced. The estimated peak pressure was 69m67m. - Right atrium: There is significant bowing of the interatrial septum to the left consistent with severely elavated right sided pressures. The atrium was massively dilated. - Tricuspid valve: Moderate regurgitation. - Pulmonary arteries: Systolic pressure was severely increased. PA peak pressure: 87mm82m(S). (Stable from 1 year ago)    Impression:  SCLERODERMA Not on Prednisone currently  GASTROPARESIS Going to hold   PULMONARY HYPERTENSION, SECONDARY Not currently on therapy and now with dyspnea and increased edema. Her lisinopril/HCTZ was stoipped.  - lasix x 3 days - BMP today - rov next week  Diarrhea etiology unclear but suspected due to the macitentan, ? Whether this potentated the reglan. She is still on the reglan which is counterproductive at this time.

## 2013-05-29 NOTE — Assessment & Plan Note (Signed)
Not on Prednisone currently

## 2013-05-29 NOTE — Addendum Note (Signed)
Addended by: Carlos American A on: 05/29/2013 12:15 PM   Modules accepted: Orders

## 2013-05-29 NOTE — Assessment & Plan Note (Signed)
etiology unclear but suspected due to the macitentan, ? Whether this potentated the reglan. She is still on the reglan which is counterproductive at this time.

## 2013-05-29 NOTE — Assessment & Plan Note (Signed)
Not currently on therapy and now with dyspnea and increased edema. Her lisinopril/HCTZ was stoipped.  - lasix x 3 days - BMP today - rov next week

## 2013-05-30 ENCOUNTER — Other Ambulatory Visit: Payer: Medicare Other

## 2013-05-30 DIAGNOSIS — M349 Systemic sclerosis, unspecified: Secondary | ICD-10-CM

## 2013-05-30 DIAGNOSIS — R197 Diarrhea, unspecified: Secondary | ICD-10-CM

## 2013-05-30 DIAGNOSIS — I2789 Other specified pulmonary heart diseases: Secondary | ICD-10-CM

## 2013-05-30 DIAGNOSIS — K3184 Gastroparesis: Secondary | ICD-10-CM

## 2013-05-31 ENCOUNTER — Telehealth: Payer: Self-pay | Admitting: Emergency Medicine

## 2013-05-31 MED ORDER — LISINOPRIL-HYDROCHLOROTHIAZIDE 20-25 MG PO TABS: ORAL_TABLET | ORAL | Status: DC

## 2013-05-31 NOTE — Telephone Encounter (Signed)
Pt returned call.  She has to leave at 2:30 to pick up grandchild and will return around 3:30

## 2013-05-31 NOTE — Telephone Encounter (Signed)
Pt request refill on lisinopril - hctz for her fluid, has only 1 tablet left.  Has appointment next week with RB. Sent rx to pharm, pt aware and nothing further needed

## 2013-05-31 NOTE — Telephone Encounter (Signed)
Attempted to call pt, no answer. We don't fill this medication. PCP is responsible for this.

## 2013-06-01 ENCOUNTER — Telehealth: Payer: Self-pay | Admitting: Emergency Medicine

## 2013-06-01 NOTE — Telephone Encounter (Signed)
Called and spoke with pt. She reports she is feeling fine now and will call back if this happens again. Advised her to seek emergency care if this happens over the weekend.

## 2013-06-01 NOTE — Telephone Encounter (Signed)
Called spoke with pt. She reports she experienced some numbness in both her legs and both arms and hands last night. She reports she layed down and the sensation went away. Did not have any pain, blurred vision with this. She wants to know if any of her medications could be causing this? Please advise RB thanks  Allergies  Allergen Reactions  . Cephalexin   . Ciprofloxacin   . Codeine     REACTION: GI upset  . Contrast Media [Iodinated Diagnostic Agents]   . Iohexol      Code: HIVES, Desc: pt breaks out in large hives. needs full premeds, Onset Date: [id number]      Current Outpatient Prescriptions on File Prior to Visit  Medication Sig Dispense Refill  . esomeprazole (NEXIUM) 40 MG capsule Take by mouth 2 (two) times daily.        . fenofibrate (TRICOR) 48 MG tablet take 1 tablet by mouth once daily  30 tablet  11  . FLUoxetine (PROZAC) 20 MG capsule take 1 capsule by mouth every morning  30 capsule  2  . gabapentin (NEURONTIN) 100 MG capsule take 1 capsule by mouth twice a day  60 capsule  6  . glucose blood test strip Check Blood Glucose 2-3 times a day.Dx Code:250.60.  100 each  3  . HYDROcodone-acetaminophen (NORCO) 10-325 MG per tablet 4 tablets every 4 (four) hours as needed.       Marland Kitchen ibuprofen (ADVIL) 200 MG tablet Take 200 mg by mouth every 6 (six) hours as needed.        Marland Kitchen LANOXIN 125 MCG tablet take 1 tablet by mouth once daily  30 tablet  2  . lisinopril-hydrochlorothiazide (PRINZIDE,ZESTORETIC) 20-25 MG per tablet take 1 tablet by mouth once daily  30 tablet  1  . LYRICA 150 MG capsule take 1 capsule by mouth three times a day  90 capsule  5  . Macitentan 10 MG TABS Take 1 tablet by mouth daily.      . metFORMIN (GLUCOPHAGE) 500 MG tablet Take 1 tablet (500 mg total) by mouth daily with breakfast.  90 tablet  4  . metoCLOPramide (REGLAN) 10 MG tablet Take 1/2 tablet by mouth before meals and at bedtime  180 tablet  1  . ONE TOUCH LANCETS MISC Check Blood Glucose 2-3 times a  day.Dx code: 250.60.  200 each  3   No current facility-administered medications on file prior to visit.

## 2013-06-01 NOTE — Telephone Encounter (Signed)
Not sure that there is an obvious relationship between her meds and these sx. Narcotics can sometimes cause if she has needed to take these. Also possibly neurontin. Last time we gave her loperamide for diarrhea, diuretics - does she feel better?

## 2013-06-03 LAB — STOOL CULTURE

## 2013-06-05 ENCOUNTER — Other Ambulatory Visit (INDEPENDENT_AMBULATORY_CARE_PROVIDER_SITE_OTHER): Payer: Medicare Other

## 2013-06-05 ENCOUNTER — Ambulatory Visit (INDEPENDENT_AMBULATORY_CARE_PROVIDER_SITE_OTHER): Payer: Medicare Other | Admitting: Emergency Medicine

## 2013-06-05 ENCOUNTER — Telehealth: Payer: Self-pay | Admitting: *Deleted

## 2013-06-05 ENCOUNTER — Telehealth: Payer: Self-pay | Admitting: Gastroenterology

## 2013-06-05 ENCOUNTER — Encounter: Payer: Self-pay | Admitting: Emergency Medicine

## 2013-06-05 VITALS — BP 110/74 | HR 80 | Ht 63.0 in | Wt 183.0 lb

## 2013-06-05 DIAGNOSIS — I2789 Other specified pulmonary heart diseases: Secondary | ICD-10-CM

## 2013-06-05 DIAGNOSIS — M349 Systemic sclerosis, unspecified: Secondary | ICD-10-CM

## 2013-06-05 DIAGNOSIS — R197 Diarrhea, unspecified: Secondary | ICD-10-CM

## 2013-06-05 LAB — BASIC METABOLIC PANEL
BUN: 20 mg/dL (ref 6–23)
CALCIUM: 6.9 mg/dL — AB (ref 8.4–10.5)
CO2: 22 meq/L (ref 19–32)
Chloride: 110 mEq/L (ref 96–112)
Creatinine, Ser: 1.6 mg/dL — ABNORMAL HIGH (ref 0.4–1.2)
GFR: 40.86 mL/min — ABNORMAL LOW (ref >60.00)
Glucose, Bld: 79 mg/dL (ref 70–99)
Potassium: 4.7 mEq/L (ref 3.5–5.1)
Sodium: 140 mEq/L (ref 135–145)

## 2013-06-05 LAB — HEPATIC FUNCTION PANEL
ALT: 15 U/L (ref 0–35)
AST: 35 U/L (ref 0–37)
Albumin: 3.1 g/dL — ABNORMAL LOW (ref 3.5–5.2)
Alkaline Phosphatase: 142 U/L — ABNORMAL HIGH (ref 39–117)
BILIRUBIN DIRECT: 0.3 mg/dL (ref 0.0–0.3)
Total Bilirubin: 0.8 mg/dL (ref 0.3–1.2)
Total Protein: 6.9 g/dL (ref 6.0–8.3)

## 2013-06-05 NOTE — Assessment & Plan Note (Signed)
Not currently on prednisone, has been off for months.  - do not restart at this time.

## 2013-06-05 NOTE — Assessment & Plan Note (Addendum)
She has been off macetentan for 3 weeks and this has not resolved. Stopped reglan. Etiology unclear. I would like her to see GI to evaluate - refer to GI - BMP today given her volume losses, recent diuresis.

## 2013-06-05 NOTE — Telephone Encounter (Signed)
Left message for pt to call back.  Pt scheduled to see Dr. Deatra Ina 06/25/13_0 :30am. Dawn to notify pt of appt date and time.

## 2013-06-05 NOTE — Assessment & Plan Note (Signed)
Unable to tolerate macetentan, has not complied with tyvaso in the past. She has clinically benefited from tracleer but she was never reliable with her LFTs - we discussed restarting the Tracleer, she would like to do this. Emphasized that I won't continue it if she won't get her LFT done - rov 1

## 2013-06-05 NOTE — Progress Notes (Signed)
[name] is a 63 year old woman with scleroderma and severe secondary pulmonary hypertension. She had been treated with bosentan + Coumadin. Initiated [name], Adcirca in the past but unable to tolerate. Finally started on inhaled Tyvaso in 2010. Some delay in titrating up to goal dosing of 9 puffs 4x a day due to compliance issues, but finally able to do so. This was then stopped in Summer 2012 during admission for septic knee hardware.   By TTE, her PASP improved from 102 mmHg (7/09) to 61mHg (9/10), 6 minute walk improved to 3219m11/2010).   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 That hospitalization ultimately resulted in ICU stay for sepsis, c/b renal insufficiency. TTE on June 29 showed estimated RVSP of 63 mmHg (off Tyvaso). We sent her home on bosentan + coumadin, with plans to restart Tyvaso as outpt once she stabilized. She went home on IV Abx.   Readmitted 09/2010 with fever and leukocytosis, S Cr 1.8 - 2.0. Pulm/CCM consulted to assist w her management of PAH medications.  ROV 10/29/10 -- scleroderma, PAH. Has been hospitalized x3 since our last visit due to infected knee hardware. She is off tyvaso. She is planning to go back for repeat L knee surgery this month. She states that her breathing has been going well. She is on Tracleer. She does not want to restart the Tyvaso at this time. Denies dyspnea, syncope, CP.  She is finished with IV abx.   ROV 02/01/11 -- scleroderma, PAH. Remains on coumadin + bosentan but off Tyvaso as above. Last TTE on June 29 showed estimated RVSP of 63 mmHg (off Tyvaso).  She returns for f/u. Tells me that since last visit she underwent f/u knee sgy to replace hardware. She feels it is doing well, now off abx. She is not interested in going back on Tyvaso. Her breathing  seems to be stable, although she isn't really pushing herself.   ROV 06/21/11 -- scleroderma, PAH. Remains on coumadin + bosentan but off Tyvaso. She clinically feels well, has been able to increase her activity but PAP's have increased by TTE 09/11/10. We have planned for repeat TTE in June 2013. Remains on Pred 78m12md for her scleroderma. She asks today about whether a FXa inhibitor would be possible for her, but I don't know of any studies on these meds [facility]. Due for LFT's, wants to get them at coumadin clinic if possible, she will call to arrange.   ROV 10/04/11 -- scleroderma, PAH. Remains on coumadin + bosentan but off Tyvaso. TTE on 08/18/11 shows PASP rising, now 878m21m(from 63 08/2010; her best was 42 in 11/2008). Clinically, she feels well - no breathing complaints. Her wt has been stable, trace edema without change. She ambulates and does her usual activities, denies dyspnea.   ROV 12/13/11 -- scleroderma, PAH. Remains on coumadin + bosentan but off Tyvaso. TTE on 08/18/11 shows PASP rising, now 878mm70mfrom 63 08/2010; her best was 42 in 11/2008). Returns today to discuss Tyvaso - She is still against doing it due to the fact that it is time consuming, difficult to do 4-6 times a day. She isn't sure that it helps her breathing. Her TTE showed moderate RV dilation 6/13. Denies SOB, edema or CP.   ROV 10/04/12 -- scleroderma, PAH.  Remains on coumadin + bosentan but off Tyvaso. She has not had her TTE. Has not been compliant with INR checks at [facility]. Last LFT in my records 05/2012. She reports that she has been feeling well. No breathing limitations. She is asking to be off of coumadin, off of Tracleer.   ROV 11/10/12 -- scleroderma, PAH. She has been on Tyvaso before, stopped due to difficulty administering. I stopped her Tracleer and coumadin 7/23 because she wouldn't get her labs checked reliably. She returns and states that she misses the Tracleer >> she has felt weaker, stayed in the bed,  more dyspnea, decreased functional capacity.   ROV 01/17/13 -- scleroderma, PAH. She has been on Tyvaso before, stopped due to difficulty administering. I stopped her bosentan due to non-compliance with LFT's. She missed it so I restarted 62.62m bid 8/29. I didn't restart the coumadin. She returns without any LFT since 11/10/12. I discussed macitentan with her today. I have recommended switching since LFT's don't have to be followed.   ROV 02/21/13 -- hx scleroderma, PAH. Has been on and off several PAH meds and also coumadin as detailed above. We started macitentan last time. She is currently taking at night - 169m She was having nausea and diarrhea at first, then this got better when she switched to taking at night. Her breathing is stable, maybe slightly better.  Her PASP on TTE 01/24/13 > stable at 8746m  ROV 05/29/13 -- follows up for scleroderma, PAH. Has been on and off several PAH meds and also coumadin. I stopped macitentan beginning of March due to persistent diarrhea. Since then she has continued to have diarrhea. Her breathing has worsened since we stopped the macitentan and she has more edema. She stopped lisinopril/HCTZ a week ago by PCP due to hyperkalemia. She is due for repeat TTE in June. C diff was negative. She is taking loperamide prn. Still on reglan.    ROV 06/05/13 -- follow up for [age] woman with scleroderma and PAH. Seen last week for diarrhea, abd pain. We stopped her macitentan 05/14/13 concerned that this was a possible side effect, but the sx have persisted. She has also been retaining more fluid, has gained wt. I gave her lasix x 3 days a week ago. Her S Cr has risen to 1.6 from 1.3.  We stopped reglan last visit. She has still had frequent diarrhea, nausea. Her dyspnea has worsened. The loperamide has helped some.      Exam Filed Vitals:   06/05/13 1133  BP: 110/74  Pulse: 80  Height: _0  (1.6 m)  Weight: 183 lb (83.008 kg)  SpO2: 98%   Gen: Pleasant,  well-nourished, in no distress,    ENT: No lesions,  mouth clear,  oropharynx clear, no postnasal drip  Neck: No JVD, no TMG, no carotid bruits  Lungs: No use of accessory muscles, no dullness to percussion, clear without rales or rhonchi  Cardiovascular: RRR, heart sounds normal, no murmur or gallops, no peripheral edema  Musculoskeletal: No deformities, 1-2+ pretibial edema.   Neuro: alert, non focal  Skin: Warm, no lesions or rashes    Recent Labs Lab 05/29/13 1209  NA 139  K 4.7  CL 109  CO2 20  GLUCOSE 102*  BUN 23  CREATININE 1.6*  CALCIUM 6.5*     01/24/13 --  TTE Study Conclusions  - Left ventricle: The cavity size was normal. There was mild concentric hypertrophy. Systolic function was normal. The estimated ejection fraction was  in the range of 60% to 65%. Wall motion was normal; there were no regional wall motion abnormalities. Left ventricular diastolic function parameters were normal. - Ventricular septum: The contour showed diastolic flattening and systolic flattening consistent with volume and pressure overload. - Right ventricle: The cavity size was severely dilated. There was moderate hypertrophy. Systolic function was moderately reduced. The estimated peak pressure was 30m Hg. - Right atrium: There is significant bowing of the interatrial septum to the left consistent with severely elavated right sided pressures. The atrium was massively dilated. - Tricuspid valve: Moderate regurgitation. - Pulmonary arteries: Systolic pressure was severely increased. PA peak pressure: 836mHg (S). (Stable from 1 year ago)    Impression:  SCLERODERMA Not currently on prednisone, has been off for months.  - do not restart at this time.   Diarrhea She has been off macetentan for 3 weeks and this has not resolved. Stopped reglan. Etiology unclear. I would like her to see GI to evaluate - refer to GI - BMP today given her volume losses, recent diuresis.    PULMONARY HYPERTENSION, SECONDARY Unable to tolerate macetentan, has not complied with tyvaso in the past. She has clinically benefited from tracleer but she was never reliable with her LFTs - we discussed restarting the Tracleer, she would like to do this. Emphasized that I won't continue it if she won't get her LFT done - rov 1

## 2013-06-05 NOTE — Patient Instructions (Signed)
We will begin the process to restart bosentan (Tracleer). You will HAVE to have your labs checked every month to stay on this medication.  Lab work today Do not restart reglan You may continue to use loperamide as needed for diarrhea We will refer you to see gastroenterology to evaluate your diarrhea.  Follow with Dr Lamonte Sakai in 1 month

## 2013-06-05 NOTE — Telephone Encounter (Signed)
Have faxed back form for Tracleer 06/05/13. Awaiting decision and approval

## 2013-06-07 ENCOUNTER — Emergency Department (HOSPITAL_COMMUNITY): Payer: Medicare Other

## 2013-06-07 ENCOUNTER — Telehealth: Payer: Self-pay | Admitting: Emergency Medicine

## 2013-06-07 ENCOUNTER — Inpatient Hospital Stay (HOSPITAL_COMMUNITY)
Admission: EM | Admit: 2013-06-07 | Discharge: 2013-06-14 | DRG: 292 | Disposition: A | Discharge status: Home Health | Payer: Medicare Other | Attending: Cardiology | Admitting: Cardiology

## 2013-06-07 ENCOUNTER — Encounter (HOSPITAL_COMMUNITY): Payer: Self-pay | Admitting: Emergency Medicine

## 2013-06-07 DIAGNOSIS — R7989 Other specified abnormal findings of blood chemistry: Secondary | ICD-10-CM

## 2013-06-07 DIAGNOSIS — Z833 Family history of diabetes mellitus: Secondary | ICD-10-CM

## 2013-06-07 DIAGNOSIS — R609 Edema, unspecified: Secondary | ICD-10-CM

## 2013-06-07 DIAGNOSIS — R799 Abnormal finding of blood chemistry, unspecified: Secondary | ICD-10-CM

## 2013-06-07 DIAGNOSIS — E872 Acidosis, unspecified: Secondary | ICD-10-CM

## 2013-06-07 DIAGNOSIS — R601 Generalized edema: Secondary | ICD-10-CM

## 2013-06-07 DIAGNOSIS — Z66 Do not resuscitate: Secondary | ICD-10-CM | POA: Diagnosis not present

## 2013-06-07 DIAGNOSIS — I2789 Other specified pulmonary heart diseases: Secondary | ICD-10-CM | POA: Diagnosis present

## 2013-06-07 DIAGNOSIS — Z91199 Patient's noncompliance with other medical treatment and regimen due to unspecified reason: Secondary | ICD-10-CM

## 2013-06-07 DIAGNOSIS — N189 Chronic kidney disease, unspecified: Principal | ICD-10-CM

## 2013-06-07 DIAGNOSIS — I5189 Other ill-defined heart diseases: Secondary | ICD-10-CM

## 2013-06-07 DIAGNOSIS — E878 Other disorders of electrolyte and fluid balance, not elsewhere classified: Secondary | ICD-10-CM | POA: Diagnosis present

## 2013-06-07 DIAGNOSIS — D573 Sickle-cell trait: Secondary | ICD-10-CM | POA: Diagnosis present

## 2013-06-07 DIAGNOSIS — R591 Generalized enlarged lymph nodes: Secondary | ICD-10-CM

## 2013-06-07 DIAGNOSIS — R197 Diarrhea, unspecified: Secondary | ICD-10-CM

## 2013-06-07 DIAGNOSIS — I519 Heart disease, unspecified: Secondary | ICD-10-CM

## 2013-06-07 DIAGNOSIS — E1149 Type 2 diabetes mellitus with other diabetic neurological complication: Secondary | ICD-10-CM | POA: Diagnosis present

## 2013-06-07 DIAGNOSIS — E669 Obesity, unspecified: Secondary | ICD-10-CM | POA: Diagnosis present

## 2013-06-07 DIAGNOSIS — M349 Systemic sclerosis, unspecified: Secondary | ICD-10-CM | POA: Diagnosis present

## 2013-06-07 DIAGNOSIS — N183 Chronic kidney disease, stage 3 unspecified: Secondary | ICD-10-CM | POA: Diagnosis present

## 2013-06-07 DIAGNOSIS — R0602 Shortness of breath: Secondary | ICD-10-CM

## 2013-06-07 DIAGNOSIS — I272 Pulmonary hypertension, unspecified: Secondary | ICD-10-CM

## 2013-06-07 DIAGNOSIS — E1142 Type 2 diabetes mellitus with diabetic polyneuropathy: Secondary | ICD-10-CM | POA: Diagnosis present

## 2013-06-07 DIAGNOSIS — E875 Hyperkalemia: Secondary | ICD-10-CM | POA: Diagnosis present

## 2013-06-07 DIAGNOSIS — I10 Essential (primary) hypertension: Secondary | ICD-10-CM | POA: Diagnosis present

## 2013-06-07 DIAGNOSIS — I130 Hypertensive heart and chronic kidney disease with heart failure and stage 1 through stage 4 chronic kidney disease, or unspecified chronic kidney disease: Principal | ICD-10-CM | POA: Diagnosis present

## 2013-06-07 DIAGNOSIS — I509 Heart failure, unspecified: Secondary | ICD-10-CM | POA: Diagnosis present

## 2013-06-07 DIAGNOSIS — I50811 Acute right heart failure: Secondary | ICD-10-CM

## 2013-06-07 DIAGNOSIS — N179 Acute kidney failure, unspecified: Secondary | ICD-10-CM | POA: Diagnosis present

## 2013-06-07 DIAGNOSIS — I319 Disease of pericardium, unspecified: Secondary | ICD-10-CM | POA: Diagnosis present

## 2013-06-07 DIAGNOSIS — Z9119 Patient's noncompliance with other medical treatment and regimen: Secondary | ICD-10-CM

## 2013-06-07 DIAGNOSIS — Z8249 Family history of ischemic heart disease and other diseases of the circulatory system: Secondary | ICD-10-CM

## 2013-06-07 DIAGNOSIS — IMO0002 Reserved for concepts with insufficient information to code with codable children: Secondary | ICD-10-CM | POA: Diagnosis present

## 2013-06-07 DIAGNOSIS — Z96659 Presence of unspecified artificial knee joint: Secondary | ICD-10-CM

## 2013-06-07 DIAGNOSIS — R599 Enlarged lymph nodes, unspecified: Secondary | ICD-10-CM

## 2013-06-07 DIAGNOSIS — I517 Cardiomegaly: Secondary | ICD-10-CM

## 2013-06-07 DIAGNOSIS — M129 Arthropathy, unspecified: Secondary | ICD-10-CM | POA: Diagnosis present

## 2013-06-07 DIAGNOSIS — D649 Anemia, unspecified: Secondary | ICD-10-CM

## 2013-06-07 DIAGNOSIS — K3184 Gastroparesis: Secondary | ICD-10-CM | POA: Diagnosis present

## 2013-06-07 DIAGNOSIS — K219 Gastro-esophageal reflux disease without esophagitis: Secondary | ICD-10-CM | POA: Diagnosis present

## 2013-06-07 LAB — LIPASE, BLOOD: Lipase: 15 U/L (ref 11–59)

## 2013-06-07 LAB — COMPREHENSIVE METABOLIC PANEL
ALBUMIN: 3.1 g/dL — AB (ref 3.5–5.2)
ALT: 14 U/L (ref 0–35)
AST: 29 U/L (ref 0–37)
Alkaline Phosphatase: 162 U/L — ABNORMAL HIGH (ref 39–117)
BUN: 29 mg/dL — ABNORMAL HIGH (ref 6–23)
CALCIUM: 6.9 mg/dL — AB (ref 8.4–10.5)
CO2: 17 mEq/L — ABNORMAL LOW (ref 19–32)
CREATININE: 2 mg/dL — AB (ref 0.50–1.10)
Chloride: 111 mEq/L (ref 96–112)
GFR calc Af Amer: 30 mL/min — ABNORMAL LOW (ref >90)
GFR calc non Af Amer: 26 mL/min — ABNORMAL LOW (ref >90)
Glucose, Bld: 89 mg/dL (ref 70–99)
Potassium: 5.4 mEq/L — ABNORMAL HIGH (ref 3.7–5.3)
Sodium: 144 mEq/L (ref 137–147)
Total Bilirubin: 0.6 mg/dL (ref 0.3–1.2)
Total Protein: 7.5 g/dL (ref 6.0–8.3)

## 2013-06-07 LAB — CBC
HCT: 28.7 % — ABNORMAL LOW (ref 36.0–46.0)
Hemoglobin: 9.3 g/dL — ABNORMAL LOW (ref 12.0–15.0)
MCH: 26.5 pg (ref 26.0–34.0)
MCHC: 32.4 g/dL (ref 30.0–36.0)
MCV: 81.8 fL (ref 78.0–100.0)
PLATELETS: 184 10*3/uL (ref 150–400)
RBC: 3.51 MIL/uL — ABNORMAL LOW (ref 3.87–5.11)
RDW: 18.9 % — AB (ref 11.5–15.5)
WBC: 7.9 10*3/uL (ref 4.0–10.5)

## 2013-06-07 LAB — PRO B NATRIURETIC PEPTIDE: Pro B Natriuretic peptide (BNP): 3360 pg/mL — ABNORMAL HIGH (ref 0–125)

## 2013-06-07 LAB — TROPONIN I: Troponin I: <0.3 ng/mL (ref <0.30)

## 2013-06-07 MED ORDER — ASPIRIN EC 81 MG PO TBEC
81.0000 mg | DELAYED_RELEASE_TABLET | Freq: Every day | ORAL | Status: DC
  Administered 2013-06-08 – 2013-06-14 (×6): 81 mg via ORAL
  Filled 2013-06-07 (×7): qty 1

## 2013-06-07 MED ORDER — FUROSEMIDE 20 MG PO TABS: 20.0000 mg | ORAL_TABLET | Freq: Every day | ORAL | Status: DC

## 2013-06-07 MED ORDER — FUROSEMIDE 10 MG/ML IJ SOLN
60.0000 mg | Freq: Once | INTRAMUSCULAR | Status: AC
  Administered 2013-06-07: 60 mg via INTRAVENOUS
  Filled 2013-06-07: qty 8

## 2013-06-07 MED ORDER — FUROSEMIDE 10 MG/ML IJ SOLN
40.0000 mg | Freq: Two times a day (BID) | INTRAMUSCULAR | Status: DC
  Administered 2013-06-08: 40 mg via INTRAVENOUS
  Filled 2013-06-07 (×3): qty 4

## 2013-06-07 NOTE — Telephone Encounter (Signed)
Order lasix 80m daily for her (new medication). Ask her to eliminate salt from her diet. She needs to come in to have a BMP next week if this is not already ordered. Thanks

## 2013-06-07 NOTE — Telephone Encounter (Signed)
Advised pt that papers been had faxed back on 06/05/13 and am waiting response on approval or denial. Pt is complaining of still having a lot of swelling and being very uncomfortable.  She is requesting further rec's for her swelling. Advised her would send message to RB and see what his recommendation is. Please advise RB, thanks!

## 2013-06-07 NOTE — ED Notes (Signed)
Pt c/o "whole body swelling". Pt states she has swelling in lower extremities. States legs fell tight. Pt also c/o chest tightness in upper chest since Monday. Pt also c/o shortness of breath. Pt alert, no acute distress. Skin warm and dry.

## 2013-06-07 NOTE — ED Notes (Signed)
Pt placed on bedpan at this time.

## 2013-06-07 NOTE — Telephone Encounter (Signed)
Attempted to call to advise of RB's rec's of Lasix's 20 mg daily.  This medication was sent to pharm and BMP order placed. Will call pt back no answer and no VM

## 2013-06-07 NOTE — ED Provider Notes (Signed)
CSN: [id number]     Arrival date & time 06/07/13  1653 History   First MD Initiated Contact with Patient 06/07/13 1810     Chief Complaint  Patient presents with  . Body Swelling   . Chest Pain     (Consider location/radiation/quality/duration/timing/severity/associated sxs/prior Treatment) Patient is a 63 y.o. female presenting with shortness of breath. The history is provided by the patient.  Shortness of Breath Severity:  Mild Onset quality:  Gradual Duration: 2-3. Timing:  Constant Progression:  Worsening Chronicity:  New Context comment:  At rest, worse w/ activity Relieved by:  Nothing Worsened by:  Nothing tried Ineffective treatments:  None tried Associated symptoms: no abdominal pain, no chest pain, no cough, no fever, no headaches, no neck pain and no vomiting   Associated symptoms comment:  Abdominal distension    Past Medical History  Diagnosis Date  . Secondary pulmonary hypertension     right heart cath 04/20/04  . Cough   . Allergic rhinitis, cause unspecified   . Type II or unspecified type diabetes mellitus without mention of complication, not stated as uncontrolled   . Degeneration of lumbar or lumbosacral intervertebral disc   . Esophageal reflux   . Systemic sclerosis   . Unspecified essential hypertension   . Rotator cuff tear   . Gastritis   . Sickle cell trait   . Arthritis   . Obesity   . Visual changes   . Scleroderma   . Diastolic dysfunction   . Trichomonas   . Vaginal bleeding   . Neuropathy    Past Surgical History  Procedure Laterality Date  . Partial hysterectomy  1983  . Tubal ligation    . Tonsillectomy    . Upper endoscopy with biopsy  03/10/2006  . Right shoulder arthroscopy, subacromial decompression    . Cardiac catheterization  04/24/2004  . Replacement total knee  11/2000   Family History  Problem Relation Age of Onset  . Heart disease Mother   . Diabetes Mother   . Diabetes Sister    History  Substance Use Topics   . Smoking status: Never Smoker   . Smokeless tobacco: Never Used  . Alcohol Use: No   OB History   Grav Para Term Preterm Abortions TAB SAB Ect Mult Living   _0 Review of Systems  Constitutional: Negative for fever and fatigue.  HENT: Negative for congestion and drooling.   Eyes: Negative for pain.  Respiratory: Positive for chest tightness and shortness of breath. Negative for cough.   Cardiovascular: Negative for chest pain.  Gastrointestinal: Positive for abdominal distention. Negative for nausea, vomiting, abdominal pain and diarrhea.  Genitourinary: Negative for dysuria and hematuria.  Musculoskeletal: Negative for back pain, gait problem and neck pain.       Edema  Skin: Negative for color change.  Neurological: Negative for dizziness and headaches.  Hematological: Negative for adenopathy.  Psychiatric/Behavioral: Negative for behavioral problems.  All other systems reviewed and are negative.      Allergies  Cephalexin; Ciprofloxacin; Codeine; Contrast media; and Iohexol  Home Medications   Current Outpatient Rx  Name  Route  Sig  Dispense  Refill  . esomeprazole (NEXIUM) 40 MG capsule   Oral   Take by mouth 2 (two) times daily.           . fenofibrate (TRICOR) 48 MG tablet      take 1 tablet by mouth  once daily   30 tablet   11   . FLUoxetine (PROZAC) 20 MG capsule      take 1 capsule by mouth every morning   30 capsule   2   . furosemide (LASIX) 20 MG tablet   Oral   Take 1 tablet (20 mg total) by mouth daily.   30 tablet   6   . gabapentin (NEURONTIN) 100 MG capsule      take 1 capsule by mouth twice a day   60 capsule   6   . glucose blood test strip      Check Blood Glucose 2-3 times a day.Dx Code:250.60.   100 each   3   . HYDROcodone-acetaminophen (NORCO) 10-325 MG per tablet      4 tablets every 4 (four) hours as needed.          Marland Kitchen ibuprofen (ADVIL) 200 MG tablet   Oral   Take 200 mg by mouth every 6 (six)  hours as needed.           Marland Kitchen LANOXIN 125 MCG tablet      take 1 tablet by mouth once daily   30 tablet   2     Dispense as written.   Marland Kitchen lisinopril-hydrochlorothiazide (PRINZIDE,ZESTORETIC) 20-25 MG per tablet      take 1 tablet by mouth once daily   30 tablet   1   . LYRICA 150 MG capsule      take 1 capsule by mouth three times a day   90 capsule   5     Pharmacy Fax [phone]   . Macitentan 10 MG TABS   Oral   Take 1 tablet by mouth daily.         . metFORMIN (GLUCOPHAGE) 500 MG tablet   Oral   Take 1 tablet (500 mg total) by mouth daily with breakfast.   90 tablet   4   . metoCLOPramide (REGLAN) 10 MG tablet      Take 1/2 tablet by mouth before meals and at bedtime   180 tablet   1   . ONE TOUCH LANCETS MISC      Check Blood Glucose 2-3 times a day.Dx code: 250.60.   200 each   3    BP 127/77  Pulse 77  Temp(Src) 98 F (36.7 C) (Oral)  Resp 26  SpO2 95% Physical Exam  Nursing note and vitals reviewed. Constitutional: She is oriented to person, place, and time. She appears well-developed and well-nourished.  HENT:  Head: Normocephalic.  Mouth/Throat: Oropharynx is clear and moist. No oropharyngeal exudate.  Eyes: Conjunctivae and EOM are normal. Pupils are equal, round, and reactive to light.  Neck: Normal range of motion. Neck supple.  Cardiovascular: Normal rate, regular rhythm, normal heart sounds and intact distal pulses.  Exam reveals no gallop and no friction rub.   No murmur heard. Pulmonary/Chest: Effort normal and breath sounds normal. No respiratory distress. She has no wheezes.  Abdominal: Soft. Bowel sounds are normal. She exhibits distension (mild to moderate distention of abdomen.). There is no tenderness. There is no rebound and no guarding.  Musculoskeletal: Normal range of motion. She exhibits edema (Mild to moderate pitting edema in bilateral lower extremities extending up to the thighs bilaterally.). She exhibits no tenderness.   Neurological: She is alert and oriented to person, place, and time.  Skin: Skin is warm and dry.  Psychiatric: She has a normal mood and affect. Her behavior is  normal.    ED Course  Procedures (including critical care time) Labs Review Labs Reviewed  PRO B NATRIURETIC PEPTIDE - Abnormal; Notable for the following:    Pro B Natriuretic peptide (BNP) 3360.0 (*)    All other components within normal limits  CBC - Abnormal; Notable for the following:    RBC 3.51 (*)    Hemoglobin 9.3 (*)    HCT 28.7 (*)    RDW 18.9 (*)    All other components within normal limits  COMPREHENSIVE METABOLIC PANEL - Abnormal; Notable for the following:    Potassium 5.4 (*)    CO2 17 (*)    BUN 29 (*)    Creatinine, Ser 2.00 (*)    Calcium 6.9 (*)    Albumin 3.1 (*)    Alkaline Phosphatase 162 (*)    GFR calc non Af Amer 26 (*)    GFR calc Af Amer 30 (*)    All other components within normal limits  URINE CULTURE  LIPASE, BLOOD  TROPONIN I  URINALYSIS, ROUTINE W REFLEX MICROSCOPIC  FERRITIN  IRON AND TIBC  TROPONIN I  TROPONIN I  TROPONIN I  TSH  HEMOGLOBIN A1C  PROTIME-INR  APTT  MAGNESIUM   Imaging Review Ct Abdomen Pelvis Wo Contrast  06/07/2013   CLINICAL DATA:  Swelling.  Pain.  EXAM: CT ABDOMEN AND PELVIS WITHOUT CONTRAST  TECHNIQUE: Multidetector CT imaging of the abdomen and pelvis was performed following the standard protocol without intravenous contrast.  COMPARISON:  DG ABDOMEN 1V dated 09/25/2010  FINDINGS: Liver contains multiple tiny cysts and is otherwise normal. Spleen is unremarkable. Pancreas is unremarkable. No biliary distention. The gallbladder is not distended.  Adrenals are normal. No focal renal abnormality. No hydronephrosis. No evidence of obstructing ureteral stone. The bladder is nondistended. Hysterectomy. Diffuse mild ascites.  Inguinal, iliac, and retroperitoneal prominent lymph nodes are noted. Largest retroperitoneal lymph node measures 1.6 cm, and is best seen  on image number 54/series 2. These lymph nodes could be malignant. PET-CT may prove useful for further evaluation. Aorta normal caliber. No aneurysm.  Appendix is not visualized. Mild prominence of small and large bowel noted. Mild adynamic ileus cannot be excluded. There is no evidence of bowel obstruction. Stool is present in the colon. Sliding hiatal hernia is present. Gastric wall thickening cannot be excluded. Small mesenteric lymph nodes cannot be excluded. No free air.  Diffuse anasarca. No significant abdominal wall hernia. Basilar atelectasis. Small pleural effusions. Severe cardiomegaly. Moderate pericardial effusion. Diffuse degenerative changes lumbar spine.  IMPRESSION: 1. Multiple bilateral inguinal, iliac, and retroperitoneal lymph nodes. Mesenteric lymph nodes cannot be excluded. PET-CT should be considered for further evaluation.  2. Diffuse anasarca. Mild ascites. Small pleural effusions. Moderate pericardial effusion.  3.  Severe cardiomegaly.  4.  Cannot exclude mild adynamic ileus.   Electronically Signed   By: Marcello Moores  Register   On: 06/07/2013 21:01   Dg Chest 2 View  06/07/2013   CLINICAL DATA:  Chest pain.  EXAM: CHEST  2 VIEW  COMPARISON:  DG CHEST 1V PORT dated 12/26/2010  FINDINGS: Cardiomegaly with pulmonary venous congestion. No pleural effusion or pneumothorax. Poor inspiratory effort. Mild infiltrates in the lung bases cannot be excluded. No acute bony abnormality .  IMPRESSION: 1. Severe cardiomegaly.  Mild pulmonary venous congestion. 2. Poor inspiration with mild bibasilar atelectasis and/or infiltrates.   Electronically Signed   By: Marcello Moores  Register   On: 06/07/2013 19:19     EKG Interpretation   Date/Time:  Thursday June 07 2013 17:45:53 EDT Ventricular Rate:  73 PR Interval:  207 QRS Duration: 102 QT Interval:  432 QTC Calculation: 476 R Axis:   133 Text Interpretation:  Sinus rhythm Low voltage, precordial leads Probable  right ventricular hypertrophy  Borderline T abnormalities, anterior leads  No significant change since last tracing Confirmed by Estel Tonelli  MD,  Jaclynn Laumann ([id number]) on 06/07/2013 9:44:30 PM      MDM   Final diagnoses:  Anasarca  SOB (shortness of breath)  Elevated brain natriuretic peptide (BNP) level  Metabolic acidosis  Cardiomegaly    6:35 PM 63 y.o. female w hx of scleroderma and secondary pulm HTN who presents with shortness of breath, intermittent chest tightness, and gradual swelling of her lower extremities and abdomen over the last few weeks. She she is also had diarrhea for the last few months. She denies any chest pain on exam currently. She has new onset distention of her abdomen which is soft on exam. She is afebrile and vital signs are unremarkable here. Will get screening labwork and CT imaging of abdomen.  The patient will be admitted to [facility].     Blanchard Kelch, MD 06/07/13 2326

## 2013-06-07 NOTE — ED Notes (Signed)
Pt unable to void at this time.

## 2013-06-07 NOTE — ED Notes (Signed)
Hospitalist at bedside at this time

## 2013-06-08 DIAGNOSIS — I509 Heart failure, unspecified: Secondary | ICD-10-CM

## 2013-06-08 DIAGNOSIS — E878 Other disorders of electrolyte and fluid balance, not elsewhere classified: Secondary | ICD-10-CM | POA: Diagnosis present

## 2013-06-08 DIAGNOSIS — I319 Disease of pericardium, unspecified: Secondary | ICD-10-CM

## 2013-06-08 DIAGNOSIS — N179 Acute kidney failure, unspecified: Secondary | ICD-10-CM | POA: Diagnosis present

## 2013-06-08 LAB — TROPONIN I
Troponin I: <0.3 ng/mL (ref <0.30)
Troponin I: <0.3 ng/mL (ref <0.30)

## 2013-06-08 LAB — COMPREHENSIVE METABOLIC PANEL
ALBUMIN: 3 g/dL — AB (ref 3.5–5.2)
ALK PHOS: 159 U/L — AB (ref 39–117)
ALT: 13 U/L (ref 0–35)
AST: 44 U/L — ABNORMAL HIGH (ref 0–37)
BILIRUBIN TOTAL: 0.8 mg/dL (ref 0.3–1.2)
BUN: 29 mg/dL — ABNORMAL HIGH (ref 6–23)
CO2: 16 meq/L — AB (ref 19–32)
Calcium: 7.5 mg/dL — ABNORMAL LOW (ref 8.4–10.5)
Chloride: 106 mEq/L (ref 96–112)
Creatinine, Ser: 1.89 mg/dL — ABNORMAL HIGH (ref 0.50–1.10)
GFR calc non Af Amer: 28 mL/min — ABNORMAL LOW (ref >90)
GFR, EST AFRICAN AMERICAN: 32 mL/min — AB (ref >90)
Glucose, Bld: 76 mg/dL (ref 70–99)
POTASSIUM: 5.6 meq/L — AB (ref 3.7–5.3)
Sodium: 141 mEq/L (ref 137–147)
Total Protein: 7.3 g/dL (ref 6.0–8.3)

## 2013-06-08 LAB — URINALYSIS, ROUTINE W REFLEX MICROSCOPIC
Bilirubin Urine: NEGATIVE
Glucose, UA: NEGATIVE mg/dL
HGB URINE DIPSTICK: NEGATIVE
Ketones, ur: NEGATIVE mg/dL
Leukocytes, UA: NEGATIVE
Nitrite: NEGATIVE
PROTEIN: 100 mg/dL — AB
SPECIFIC GRAVITY, URINE: 1.013 (ref 1.005–1.030)
UROBILINOGEN UA: 1 mg/dL (ref 0.0–1.0)
pH: 6 (ref 5.0–8.0)

## 2013-06-08 LAB — MRSA PCR SCREENING: MRSA by PCR: NEGATIVE

## 2013-06-08 LAB — CBC WITH DIFFERENTIAL/PLATELET
BASOS ABS: 0 10*3/uL (ref 0.0–0.1)
Basophils Relative: 0 % (ref 0–1)
EOS PCT: 1 % (ref 0–5)
Eosinophils Absolute: 0.1 10*3/uL (ref 0.0–0.7)
HCT: 28.7 % — ABNORMAL LOW (ref 36.0–46.0)
Hemoglobin: 9.7 g/dL — ABNORMAL LOW (ref 12.0–15.0)
Lymphocytes Relative: 42 % (ref 12–46)
Lymphs Abs: 3.4 10*3/uL (ref 0.7–4.0)
MCH: 27.1 pg (ref 26.0–34.0)
MCHC: 33.8 g/dL (ref 30.0–36.0)
MCV: 80.2 fL (ref 78.0–100.0)
Monocytes Absolute: 1.3 10*3/uL — ABNORMAL HIGH (ref 0.1–1.0)
Monocytes Relative: 16 % — ABNORMAL HIGH (ref 3–12)
NEUTROS ABS: 3.2 10*3/uL (ref 1.7–7.7)
NEUTROS PCT: 40 % — AB (ref 43–77)
Platelets: 170 10*3/uL (ref 150–400)
RBC: 3.58 MIL/uL — ABNORMAL LOW (ref 3.87–5.11)
RDW: 18.8 % — AB (ref 11.5–15.5)
WBC: 8 10*3/uL (ref 4.0–10.5)

## 2013-06-08 LAB — GLUCOSE, CAPILLARY
GLUCOSE-CAPILLARY: 78 mg/dL (ref 70–99)
Glucose-Capillary: 76 mg/dL (ref 70–99)
Glucose-Capillary: 93 mg/dL (ref 70–99)
Glucose-Capillary: 110 mg/dL — ABNORMAL HIGH (ref 70–99)

## 2013-06-08 LAB — BASIC METABOLIC PANEL
BUN: 29 mg/dL — ABNORMAL HIGH (ref 6–23)
CHLORIDE: 104 meq/L (ref 96–112)
CO2: 22 meq/L (ref 19–32)
Calcium: 8 mg/dL — ABNORMAL LOW (ref 8.4–10.5)
Creatinine, Ser: 1.85 mg/dL — ABNORMAL HIGH (ref 0.50–1.10)
GFR calc Af Amer: 33 mL/min — ABNORMAL LOW (ref >90)
GFR calc non Af Amer: 28 mL/min — ABNORMAL LOW (ref >90)
Glucose, Bld: 95 mg/dL (ref 70–99)
Potassium: 5 mEq/L (ref 3.7–5.3)
Sodium: 140 mEq/L (ref 137–147)

## 2013-06-08 LAB — IRON AND TIBC
Iron: 39 ug/dL — ABNORMAL LOW (ref 42–135)
SATURATION RATIOS: 15 % — AB (ref 20–55)
TIBC: 260 ug/dL (ref 250–470)
UIBC: 221 ug/dL (ref 125–400)

## 2013-06-08 LAB — URINE MICROSCOPIC-ADD ON

## 2013-06-08 LAB — DIC (DISSEMINATED INTRAVASCULAR COAGULATION)PANEL
D-Dimer, Quant: 4.79 ug/mL-FEU — ABNORMAL HIGH (ref 0.00–0.48)
Fibrinogen: 267 mg/dL (ref 204–475)
INR: 1.68 — ABNORMAL HIGH (ref 0.00–1.49)
Platelets: 178 10*3/uL (ref 150–400)

## 2013-06-08 LAB — DIGOXIN LEVEL: Digoxin Level: 0.8 ng/mL (ref 0.8–2.0)

## 2013-06-08 LAB — CREATININE, URINE, RANDOM: CREATININE, URINE: 86.5 mg/dL

## 2013-06-08 LAB — MAGNESIUM
MAGNESIUM: 0.9 mg/dL — AB (ref 1.5–2.5)
Magnesium: 0.6 mg/dL — CL (ref 1.5–2.5)
Magnesium: 1.6 mg/dL (ref 1.5–2.5)

## 2013-06-08 LAB — TSH: TSH: 4.523 u[IU]/mL — ABNORMAL HIGH (ref 0.350–4.500)

## 2013-06-08 LAB — PROTIME-INR
INR: 1.83 — ABNORMAL HIGH (ref 0.00–1.49)
PROTHROMBIN TIME: 20.6 s — AB (ref 11.6–15.2)

## 2013-06-08 LAB — SODIUM, URINE, RANDOM: Sodium, Ur: 104 mEq/L

## 2013-06-08 LAB — HEMOGLOBIN A1C
Hgb A1c MFr Bld: 5.3 % (ref <5.7)
Mean Plasma Glucose: 105 mg/dL (ref <117)

## 2013-06-08 LAB — APTT: aPTT: 45 seconds — ABNORMAL HIGH (ref 24–37)

## 2013-06-08 LAB — DIC (DISSEMINATED INTRAVASCULAR COAGULATION) PANEL
Prothrombin Time: 19.3 seconds — ABNORMAL HIGH (ref 11.6–15.2)
SMEAR REVIEW: NONE SEEN
aPTT: 44 seconds — ABNORMAL HIGH (ref 24–37)

## 2013-06-08 LAB — PROCALCITONIN: Procalcitonin: <0.1 ng/mL

## 2013-06-08 LAB — FERRITIN: Ferritin: 48 ng/mL (ref 10–291)

## 2013-06-08 MED ORDER — MAGNESIUM SULFATE IN D5W 10-5 MG/ML-% IV SOLN
1.0000 g | Freq: Once | INTRAVENOUS | Status: AC
  Administered 2013-06-08: 1 g via INTRAVENOUS
  Filled 2013-06-08: qty 100

## 2013-06-08 MED ORDER — ONDANSETRON HCL 4 MG PO TABS: 4.0000 mg | ORAL_TABLET | Freq: Four times a day (QID) | ORAL | Status: DC | PRN

## 2013-06-08 MED ORDER — INSULIN ASPART 100 UNIT/ML ~~LOC~~ SOLN
0.0000 [IU] | Freq: Three times a day (TID) | SUBCUTANEOUS | Status: DC
  Administered 2013-06-09 (×2): 1 [IU] via SUBCUTANEOUS

## 2013-06-08 MED ORDER — INSULIN ASPART 100 UNIT/ML ~~LOC~~ SOLN: 0.0000 [IU] | Freq: Every day | SUBCUTANEOUS | Status: DC

## 2013-06-08 MED ORDER — ACETAMINOPHEN 325 MG PO TABS: 650.0000 mg | ORAL_TABLET | Freq: Four times a day (QID) | ORAL | Status: DC | PRN

## 2013-06-08 MED ORDER — PREGABALIN 25 MG PO CAPS
150.0000 mg | ORAL_CAPSULE | Freq: Three times a day (TID) | ORAL | Status: DC
  Administered 2013-06-08 – 2013-06-14 (×19): 150 mg via ORAL
  Filled 2013-06-08 (×2): qty 2
  Filled 2013-06-08 (×2): qty 6
  Filled 2013-06-08 (×2): qty 2
  Filled 2013-06-08: qty 1
  Filled 2013-06-08: qty 2
  Filled 2013-06-08: qty 1
  Filled 2013-06-08 (×11): qty 2
  Filled 2013-06-08: qty 1
  Filled 2013-06-08: qty 2
  Filled 2013-06-08 (×3): qty 1

## 2013-06-08 MED ORDER — ONDANSETRON HCL 4 MG/2ML IJ SOLN
4.0000 mg | Freq: Four times a day (QID) | INTRAMUSCULAR | Status: DC | PRN
  Administered 2013-06-09: 4 mg via INTRAVENOUS
  Filled 2013-06-08: qty 2

## 2013-06-08 MED ORDER — METOCLOPRAMIDE HCL 5 MG/ML IJ SOLN: 10.0000 mg | Freq: Three times a day (TID) | INTRAMUSCULAR | Status: DC | PRN

## 2013-06-08 MED ORDER — DIGOXIN 0.0625 MG HALF TABLET
0.0625 mg | ORAL_TABLET | Freq: Every day | ORAL | Status: DC
  Administered 2013-06-08: 0.0625 mg via ORAL
  Filled 2013-06-08: qty 1

## 2013-06-08 MED ORDER — PANTOPRAZOLE SODIUM 40 MG IV SOLR
40.0000 mg | Freq: Two times a day (BID) | INTRAVENOUS | Status: DC
  Administered 2013-06-08: 40 mg via INTRAVENOUS
  Filled 2013-06-08 (×3): qty 40

## 2013-06-08 MED ORDER — GABAPENTIN 100 MG PO CAPS
100.0000 mg | ORAL_CAPSULE | Freq: Two times a day (BID) | ORAL | Status: DC
  Administered 2013-06-08 – 2013-06-14 (×14): 100 mg via ORAL
  Filled 2013-06-08 (×16): qty 1

## 2013-06-08 MED ORDER — HEPARIN SODIUM (PORCINE) 5000 UNIT/ML IJ SOLN
5000.0000 [IU] | Freq: Three times a day (TID) | INTRAMUSCULAR | Status: DC
  Administered 2013-06-08 – 2013-06-14 (×19): 5000 [IU] via SUBCUTANEOUS
  Filled 2013-06-08 (×23): qty 1

## 2013-06-08 MED ORDER — SODIUM CHLORIDE 0.9 % IV SOLN
1.0000 g | Freq: Once | INTRAVENOUS | Status: AC
  Administered 2013-06-08: 1 g via INTRAVENOUS
  Filled 2013-06-08: qty 10

## 2013-06-08 MED ORDER — SODIUM CHLORIDE 0.9 % IJ SOLN
3.0000 mL | Freq: Two times a day (BID) | INTRAMUSCULAR | Status: DC
  Administered 2013-06-08 – 2013-06-14 (×9): 3 mL via INTRAVENOUS

## 2013-06-08 MED ORDER — MILRINONE IN DEXTROSE 20 MG/100ML IV SOLN
0.2500 ug/kg/min | INTRAVENOUS | Status: DC
  Administered 2013-06-08 – 2013-06-11 (×6): 0.25 ug/kg/min via INTRAVENOUS
  Filled 2013-06-08 (×6): qty 100

## 2013-06-08 MED ORDER — PANTOPRAZOLE SODIUM 40 MG PO TBEC
40.0000 mg | DELAYED_RELEASE_TABLET | Freq: Two times a day (BID) | ORAL | Status: DC
  Administered 2013-06-08 – 2013-06-14 (×13): 40 mg via ORAL
  Filled 2013-06-08 (×13): qty 1

## 2013-06-08 MED ORDER — MAGNESIUM SULFATE 4000MG/100ML IJ SOLN
4.0000 g | Freq: Once | INTRAMUSCULAR | Status: AC
  Administered 2013-06-08: 4 g via INTRAVENOUS
  Filled 2013-06-08: qty 100

## 2013-06-08 MED ORDER — FUROSEMIDE 10 MG/ML IJ SOLN
10.0000 mg/h | INTRAVENOUS | Status: DC
  Administered 2013-06-08 – 2013-06-11 (×3): 10 mg/h via INTRAVENOUS
  Filled 2013-06-08 (×5): qty 25

## 2013-06-08 MED ORDER — HYDROCODONE-ACETAMINOPHEN 5-325 MG PO TABS
1.0000 | ORAL_TABLET | ORAL | Status: DC | PRN
  Administered 2013-06-08 – 2013-06-13 (×15): 1 via ORAL
  Filled 2013-06-08 (×15): qty 1

## 2013-06-08 NOTE — Telephone Encounter (Signed)
Called pt again today and line just rings busy.  Also, called # for emergency contact that # as well not working. Trying to advise pt of Lasix being sent to pharm for her swelling

## 2013-06-08 NOTE — Consult Note (Signed)
Advanced Heart Failure Team Consult Note   Reason for Consultation: PAH management  HPI:    [name] is a 63 y/o woman, never smoker, hx scleroderma (formerly on chronic pred 49m), HTN, DM, sickle cell trait. She has been followed by LRafael Biharifor severe secondary PAH by Dr SAlva Garnetand then by Dr BLamonte Sakaisince 11/2005. She has been on several PAH targeted therapies and coumadin over last 9 years, but has not been reliably on therapy of any kind since 09/2012 due primarily to compliance and ability to have labs performed. She has had best response to Tyvaso (+ bosentan) with an improvement in 6 minute walk and in PASP from 102 mmHg (7/09) to 42 mmHg (9/10). Since stopping Tyvaso her PASP has risen to 80's by TTE 6/13 and 11/14. Surprisingly, she has never required supplemental O2. Most recent efforts have been to start macitentan in 11/14. She developed nausea and diarrhea and macitentan was stopped early 3/15. Her diarrhea continued (C diff negative). She was then taken off of lisinopril/HCTZ in mid-March by Dr MDaryll Drown She has had progressive edema, dyspnea, lethargy for the last 4-6 weeks.+ dizziness but no syncope. Outpatient diuresis had minimal effect. Her S Cr has risen to 2.0 from 1.2 (04/24/13).   [facility] evidence decompensated R heart failure. CT abdomen with pericardial effusion, tiny pleural effusions, mild ascites. Also noted scattered abdominal, inguinal lymphadenopathy. Seen by PCCM who requested consult for co-management of severe PAH.   Echo reviewed. LVEF ~50% Severe RV dysfunction with evidence of cor pulmonale RVSP 947mG. Small posterior pericardial effusion  Lives with 1636/o granddaughter. No there family help available.     Review of Systems: [y] = yes, _0  = no   General: Weight gain _1 ; Weight loss _2 ; Anorexia [yBlue.Reese; Fatigue [yBlue.Reese; Fever _3 ; Chills _4 ; Weakness _5   Cardiac: Chest pain/pressure _6 ; Resting SOB [ y]; Exertional SOB [ y]; Orthopnea  _7 ; Pedal Edema [ y]; Palpitations _8 ; Syncope _9 ; Presyncope _10 ; Paroxysmal nocturnal dyspnea_11   Pulmonary: Cough _12 ; Wheezing_13 ; Hemoptysis_14 ; Sputum _15 ; Snoring _16   GI: Vomiting_17 ; Dysphagia_18 ; Melena_19 ; Hematochezia _20 ; Heartburn_21 ; Abdominal pain [yBlue.Reese; Constipation _22 ; Diarrhea _23 ; BRBPR _24   GU: Hematuria_25 ; Dysuria _26 ; Nocturia_27   Vascular: Pain in legs with walking _28 ; Pain in feet with lying flat _29 ; Non-healing sores _30 ; Stroke _31 ; TIA _32 ; Slurred speech _33 ;  Neuro: Headaches_34 ; Vertigo_35 ; Seizures_36 ; Paresthesias_37 ;Blurred vision _38 ; Diplopia _39 ; Vision changes _40   Ortho/Skin: Arthritis _41 ; Joint pain [yBlue.Reese; Muscle pain _42 ; Joint swelling _43 ; Back Pain _44 ; Rash _45   Psych: Depression_46 ; Anxiety_47   Heme: Bleeding problems _48 ; Clotting disorders _49 ; Anemia _50   Endocrine: Diabetes [yBlue.Reese; Thyroid dysfunction_51   Home Medications Prior to Admission medications   Medication Sig Start Date End Date Taking? Authorizing Provider  esomeprazole (NEXIUM) 40 MG capsule Take by mouth 2 (two) times daily.     Yes Historical Provider, MD  fenofibrate (TRICOR) 48 MG tablet take 1 tablet by mouth once daily 04/11/13  Yes EmSid FalconMD  FLUoxetine (PROZAC) 20 MG capsule take 1 capsule by mouth every morning 04/11/13  Yes EmSid FalconMD  furosemide (LASIX) 20 MG tablet Take 1 tablet (  20 mg total) by mouth daily. 06/07/13  Yes Collene Gobble, MD  gabapentin (NEURONTIN) 100 MG capsule take 1 capsule by mouth twice a day 04/11/13  Yes Marcial Pacas, MD  glucose blood test strip Check Blood Glucose 2-3 times a day.Dx Code:250.60.   Yes Sid Falcon, MD  HYDROcodone-acetaminophen [facility]) 10-325 MG per tablet Take 4 tablets by mouth every 4 (four) hours as needed for moderate pain.  07/17/12  Yes Historical Provider, MD  ibuprofen (ADVIL) 200 MG tablet Take 200 mg by mouth every 6 (six) hours as needed.     Yes Historical Provider, MD  LANOXIN 125 MCG tablet take 1  tablet by mouth once daily   Yes Fay Records, MD  lisinopril-hydrochlorothiazide [facility]) 20-25 MG per tablet take 1 tablet by mouth once daily 05/31/13  Yes Collene Gobble, MD  LYRICA 150 MG capsule take 1 capsule by mouth three times a day 03/13/13  Yes Penni Bombard, MD  Macitentan 10 MG TABS Take 1 tablet by mouth daily. "Opsumit."   Yes Historical Provider, MD  metFORMIN (GLUCOPHAGE) 500 MG tablet Take 1 tablet (500 mg total) by mouth daily with breakfast. 04/16/13 04/16/14 Yes Sid Falcon, MD  metoCLOPramide (REGLAN) 10 MG tablet Take 1/2 tablet by mouth before meals and at bedtime 10/09/12  Yes Milta Deiters, MD  ONE TOUCH LANCETS MISC Check Blood Glucose 2-3 times a day.Dx code: 250.60.   Yes Sid Falcon, MD    Past Medical History: Past Medical History  Diagnosis Date  . Secondary pulmonary hypertension     right heart cath 04/20/04  . Cough   . Allergic rhinitis, cause unspecified   . Type II or unspecified type diabetes mellitus without mention of complication, not stated as uncontrolled   . Degeneration of lumbar or lumbosacral intervertebral disc   . Esophageal reflux   . Systemic sclerosis   . Unspecified essential hypertension   . Rotator cuff tear   . Gastritis   . Sickle cell trait   . Arthritis   . Obesity   . Visual changes   . Scleroderma   . Diastolic dysfunction   . Trichomonas   . Vaginal bleeding   . Neuropathy   . CHF (congestive heart failure)     Past Surgical History: Past Surgical History  Procedure Laterality Date  . Partial hysterectomy  1983  . Tubal ligation    . Tonsillectomy    . Upper endoscopy with biopsy  03/10/2006  . Right shoulder arthroscopy, subacromial decompression    . Cardiac catheterization  04/24/2004  . Replacement total knee  11/2000    Family History: Family History  Problem Relation Age of Onset  . Heart disease Mother   . Diabetes Mother   . Diabetes Sister     Social History: History    Social History  . Marital Status: Divorced    Spouse Name: N/A    Number of Children: 2  . Years of Education: 11   Occupational History  . Umemployed    Social History Main Topics  . Smoking status: Never Smoker   . Smokeless tobacco: Never Used  . Alcohol Use: No  . Drug Use: No  . Sexual Activity: None   Other Topics Concern  . None   Social History Narrative    FAMILY HISTORY:  Significant for coronary artery disease and diabetes    Allergies:  Allergies  Allergen Reactions  . Codeine     REACTION:  GI upset  . Contrast Media [Iodinated Diagnostic Agents] Hives  . Iohexol      Code: HIVES, Desc: pt breaks out in large hives. needs full premeds, Onset Date: [id number]   . Cephalexin Rash  . Ciprofloxacin Rash    Objective:    Vital Signs:   Temp:  [97 F (36.1 C)-98.3 F (36.8 C)] 97 F (36.1 C) (03/27 1100) Pulse Rate:  [58-79] 60 (03/27 1100) Resp:  [12-26] 13 (03/27 1100) BP: (97-127)/(60-80) 126/79 mmHg (03/27 1000) SpO2:  [94 %-100 %] 100 % (03/27 1100) Weight:  [182 lb 5.1 oz (82.7 kg)] 182 lb 5.1 oz (82.7 kg) (03/27 0109) Last BM Date: 06/08/13  Weight change: Filed Weights   06/08/13 0109  Weight: 182 lb 5.1 oz (82.7 kg)    Intake/Output:   Intake/Output Summary (Last 24 hours) at 06/08/13 1610 Last data filed at 06/08/13 1400  Gross per 24 hour  Intake    170 ml  Output   7070 ml  Net  -6900 ml     Physical Exam: General:  Somnolent. No resp difficulty HEENT: normal Neck: supple. JVP to ear  Carotids 2+ bilat; no bruits. No lymphadenopathy or thryomegaly appreciated. Cor: +RV lift Regular rate & rhythm. 3/6 TR. Loud P2 Lungs: clear Abdomen: soft, nontender, + distended. Liver edge down ~4cm. No bruits or masses. Good bowel sounds. Extremities: no cyanosis, clubbing, rash,3+  edema Neuro: alert & orientedx3, cranial nerves grossly intact. moves all 4 extremities w/o difficulty. Affect pleasant  Telemetry: SR   Labs: Basic  Metabolic Panel:  Recent Labs Lab 06/05/13 1238 06/07/13 1845 06/07/13 2347 06/08/13 0307 06/08/13 0455 06/08/13 1050  NA 140 144  --   --  141  --   K 4.7 5.4*  --   --  5.6*  --   CL 110 111  --   --  106  --   CO2 22 17*  --   --  16*  --   GLUCOSE 79 89  --   --  76  --   BUN 20 29*  --   --  29*  --   CREATININE 1.6* 2.00*  --   --  1.89*  --   CALCIUM 6.9* 6.9*  --   --  7.5*  --   MG  --   --  0.9* 0.6*  --  1.6    Liver Function Tests:  Recent Labs Lab 06/05/13 1238 06/07/13 1845 06/08/13 0455  AST 35 29 44*  ALT _0 ALKPHOS 142* 162* 159*  BILITOT 0.8 0.6 0.8  PROT 6.9 7.5 7.3  ALBUMIN 3.1* 3.1* 3.0*    Recent Labs Lab 06/07/13 1845  LIPASE 15   No results found for this basename: AMMONIA,  in the last 168 hours  CBC:  Recent Labs Lab 06/07/13 1845 06/08/13 0307 06/08/13 0455  WBC 7.9 8.0  --   NEUTROABS  --  3.2  --   HGB 9.3* 9.7*  --   HCT 28.7* 28.7*  --   MCV 81.8 80.2  --   PLT 184 170 178    Cardiac Enzymes:  Recent Labs Lab 06/07/13 1845 06/07/13 2347 06/08/13 0454 06/08/13 1050  TROPONINI <0.30 <0.30 <0.30 <0.30    BNP: BNP (last 3 results)  Recent Labs  06/07/13 1845  PROBNP 3360.0*    CBG:  Recent Labs Lab 06/08/13 0205 06/08/13 0737 06/08/13 1129  GLUCAP 78 76 93    Coagulation Studies:  Recent Labs  06/07/13 2347 06/08/13 0455  LABPROT 20.6* 19.3*  INR 1.83* 1.68*    Other results: EKG: SR 73. R-axis. RVH  Imaging: Ct Abdomen Pelvis Wo Contrast  06/07/2013   CLINICAL DATA:  Swelling.  Pain.  EXAM: CT ABDOMEN AND PELVIS WITHOUT CONTRAST  TECHNIQUE: Multidetector CT imaging of the abdomen and pelvis was performed following the standard protocol without intravenous contrast.  COMPARISON:  DG ABDOMEN 1V dated 09/25/2010  FINDINGS: Liver contains multiple tiny cysts and is otherwise normal. Spleen is unremarkable. Pancreas is unremarkable. No biliary distention. The gallbladder is not  distended.  Adrenals are normal. No focal renal abnormality. No hydronephrosis. No evidence of obstructing ureteral stone. The bladder is nondistended. Hysterectomy. Diffuse mild ascites.  Inguinal, iliac, and retroperitoneal prominent lymph nodes are noted. Largest retroperitoneal lymph node measures 1.6 cm, and is best seen on image number 54/series 2. These lymph nodes could be malignant. PET-CT may prove useful for further evaluation. Aorta normal caliber. No aneurysm.  Appendix is not visualized. Mild prominence of small and large bowel noted. Mild adynamic ileus cannot be excluded. There is no evidence of bowel obstruction. Stool is present in the colon. Sliding hiatal hernia is present. Gastric wall thickening cannot be excluded. Small mesenteric lymph nodes cannot be excluded. No free air.  Diffuse anasarca. No significant abdominal wall hernia. Basilar atelectasis. Small pleural effusions. Severe cardiomegaly. Moderate pericardial effusion. Diffuse degenerative changes lumbar spine.  IMPRESSION: 1. Multiple bilateral inguinal, iliac, and retroperitoneal lymph nodes. Mesenteric lymph nodes cannot be excluded. PET-CT should be considered for further evaluation.  2. Diffuse anasarca. Mild ascites. Small pleural effusions. Moderate pericardial effusion.  3.  Severe cardiomegaly.  4.  Cannot exclude mild adynamic ileus.   Electronically Signed   By: Marcello Moores  Register   On: 06/07/2013 21:01   Dg Chest 2 View  06/07/2013   CLINICAL DATA:  Chest pain.  EXAM: CHEST  2 VIEW  COMPARISON:  DG CHEST 1V PORT dated 12/26/2010  FINDINGS: Cardiomegaly with pulmonary venous congestion. No pleural effusion or pneumothorax. Poor inspiratory effort. Mild infiltrates in the lung bases cannot be excluded. No acute bony abnormality .  IMPRESSION: 1. Severe cardiomegaly.  Mild pulmonary venous congestion. 2. Poor inspiration with mild bibasilar atelectasis and/or infiltrates.   Electronically Signed   By: Marcello Moores  Register   On:  06/07/2013 19:19      Medications:     Current Medications: . aspirin EC  81 mg Oral Daily  . digoxin  0.0625 mg Oral Daily  . furosemide  40 mg Intravenous Q12H  . gabapentin  100 mg Oral BID  . heparin  5,000 Units Subcutaneous 3 times per day  . insulin aspart  0-5 Units Subcutaneous QHS  . insulin aspart  0-9 Units Subcutaneous TID WC  . pantoprazole  40 mg Oral BID  . pregabalin  150 mg Oral TID  . sodium chloride  3 mL Intravenous Q12H     Infusions:      Assessment:   1. PAH with cor pulmonale 2. Scleroderma 3. DM 4. Small pericardial effusion 5. Noncompliance 6. CKD, stage III - baseline Cr 1.6  Plan/Discussion:     She presents with decompensated/end-stage RHF with marked volume overload  in setting of severe PAH and medication noncompliance. Echo reviewed personally and PA pressures continue to climb. She previously had very good response to Tyvaso but her course has been complicated by severe noncompliance and refusal to take some of her meds and comply  with LFT monitoring.   She has very limited insight into her situation and combined with her lack of compliance and home support, she is not a candidate for IV therapies.   Will start milrinone and IV lasix gtt to see if we can improve her some. If turns the corner can try to restart oral/inhaled [place] med however it may be too little, too late.   If not improving with milrinone and lasix gtt, Hospice may be best option.     D/w Drs. McQuaid, [place] and [place]. The advanced HF team will continue to follow and assist with management of her PAH/corpulmonale.  Anoushka Divito,MD 4:20 PM  Length of Stay: 1 Advanced Heart Failure Team Pager [phone] (M-F; 7a - 4p)  Please contact [facility] for night-coverage after hours (4p -7a ) and weekends on [email or web address]

## 2013-06-08 NOTE — Progress Notes (Signed)
PHARMACY BRIEF NOTE:  PROTONIX IV TO PO:  The patient is receiving Protonix by the intravenous route.  Based on criteria approved by the Pharmacy and Therapeutics Committee the medication is being converted to the equivalent oral dose form.  If you have any questions about this conversion, please contact the Pharmacy Department (phone [phone]).  Thank you.  Clayburn Pert, PharmD, BCPS Pager: [phone] 06/08/2013  6:09 AM

## 2013-06-08 NOTE — Progress Notes (Signed)
INITIAL NUTRITION ASSESSMENT  DOCUMENTATION CODES Per approved criteria  -Not Applicable   INTERVENTION: - Encouraged excellent meal intake. Not interested in nutritional supplements at this time.  - Educated pt on low sodium diet therapy for CHF, provided handouts with RD contact information - Will continue to monitor   NUTRITION DIAGNOSIS: Inadequate oral intake related to poor appetite as evidenced by pt report.   Goal: Pt to consume >90% of meals  Monitor:  Weights, labs, intake  Reason for Assessment: Malnutrition screening tool   63 y.o. female  Admitting Dx: Acute right-sided CHF (congestive heart failure)  ASSESSMENT: Pt with history of scleroderma, secondary pulmonary hypertension not on any treatment due to side effects of noncompliance, diabetes mellitus, GERD, diastolic dysfunction. The patient presented with complaints of shortness of breath chest pain and generalized swelling.  Met with pt who reports eating only 1 meal/day at home that consists of chicken, rice, and some greens. States she's been eating this way for "a while". Reports her usual body weight is between 160-170 pounds, now weighs 182 pounds. Has not been adding salt to her food or cooking with it at home. States she's has problems with her teeth but can't afford to get them fixed. Reports good energy level PTA. States she ate a good breakfast.   Potassium high BUN/Cr elevated with low GFR Alk phos elevated AST elevated   Nutrition Focused Physical Exam:  Subcutaneous Fat:  Orbital Region: WNL Upper Arm Region: WNL Thoracic and Lumbar Region: WNL  Muscle:  Temple Region: WNL Clavicle Bone Region: WNL Clavicle and Acromion Bone Region: WNL Scapular Bone Region: NA Dorsal Hand: WNL Patellar Region: NA Anterior Thigh Region: NA Posterior Calf Region: NA  Edema: +3 RLE, LLE edema   Height: Ht Readings from Last 1 Encounters:  06/08/13 5' 4" (1.626 m)    Weight: Wt Readings from  Last 1 Encounters:  06/08/13 182 lb 5.1 oz (82.7 kg)    Ideal Body Weight: 120 lbs  % Ideal Body Weight: 152%  Wt Readings from Last 10 Encounters:  06/08/13 182 lb 5.1 oz (82.7 kg)  06/05/13 183 lb (83.008 kg)  05/29/13 175 lb (79.379 kg)  05/14/13 167 lb (75.751 kg)  04/16/13 159 lb 1.6 oz (72.167 kg)  02/21/13 172 lb 3.2 oz (78.109 kg)  01/17/13 170 lb 12.8 oz (77.474 kg)  11/10/12 168 lb 12.8 oz (76.567 kg)  10/04/12 171 lb 6.4 oz (77.747 kg)  08/17/12 169 lb (76.658 kg)    Usual Body Weight: 160-170s lbs per pt  % Usual Body Weight: 107-114%  BMI:  Body mass index is 31.28 kg/(m^2). Not accurate reflection of weight due to +3 RLE, LLE edema  Estimated Nutritional Needs: Kcal: 1400-1600 Protein: 60-75g Fluid: 1.4-1.6L/day  Skin: +3 RLE, LLE edema  Diet Order:  Heart healthy/carbo modified   EDUCATION NEEDS: -Education needs addressed - discussed low sodium diet therapy with pt    Intake/Output Summary (Last 24 hours) at 06/08/13 1306 Last data filed at 06/08/13 1000  Gross per 24 hour  Intake    130 ml  Output   3920 ml  Net  -3790 ml    Last BM: 3/27  Labs:   Recent Labs Lab 06/05/13 1238 06/07/13 1845 06/07/13 2347 06/08/13 0307 06/08/13 0455 06/08/13 1050  NA 140 144  --   --  141  --   K 4.7 5.4*  --   --  5.6*  --   CL 110 111  --   --  106  --   CO2 22 17*  --   --  16*  --   BUN 20 29*  --   --  29*  --   CREATININE 1.6* 2.00*  --   --  1.89*  --   CALCIUM 6.9* 6.9*  --   --  7.5*  --   MG  --   --  0.9* 0.6*  --  1.6  GLUCOSE 79 89  --   --  76  --     CBG (last 3)   Recent Labs  06/08/13 0205 06/08/13 0737 06/08/13 1129  GLUCAP 78 76 93    Scheduled Meds: . aspirin EC  81 mg Oral Daily  . digoxin  0.0625 mg Oral Daily  . furosemide  40 mg Intravenous Q12H  . gabapentin  100 mg Oral BID  . heparin  5,000 Units Subcutaneous 3 times per day  . insulin aspart  0-5 Units Subcutaneous QHS  . insulin aspart  0-9 Units  Subcutaneous TID WC  . pantoprazole  40 mg Oral BID  . pregabalin  150 mg Oral TID  . sodium chloride  3 mL Intravenous Q12H    Continuous Infusions:   Past Medical History  Diagnosis Date  . Secondary pulmonary hypertension     right heart cath 04/20/04  . Cough   . Allergic rhinitis, cause unspecified   . Type II or unspecified type diabetes mellitus without mention of complication, not stated as uncontrolled   . Degeneration of lumbar or lumbosacral intervertebral disc   . Esophageal reflux   . Systemic sclerosis   . Unspecified essential hypertension   . Rotator cuff tear   . Gastritis   . Sickle cell trait   . Arthritis   . Obesity   . Visual changes   . Scleroderma   . Diastolic dysfunction   . Trichomonas   . Vaginal bleeding   . Neuropathy   . CHF (congestive heart failure)     Past Surgical History  Procedure Laterality Date  . Partial hysterectomy  1983  . Tubal ligation    . Tonsillectomy    . Upper endoscopy with biopsy  03/10/2006  . Right shoulder arthroscopy, subacromial decompression    . Cardiac catheterization  04/24/2004  . Replacement total knee  11/2000    Mikey College MS, RD, [place] Pager [phone] After Hours Pager

## 2013-06-08 NOTE — H&P (Signed)
[facility] History and Physical  Patient: [name]  OZH:[id number]  DOB: February 22, 1951  DOS: the patient was seen and examined on 06/07/2013 PCP: Gilles Chiquito, MD  Chief Complaint: Generalized swelling and shortness of breath  HPI: [name] is a 63 y.o. female with Past medical history of scleroderma, secondary pulmonary hypertension not on any treatment due to side effects of noncompliance, diabetes mellitus, GERD, diastolic dysfunction. The patient presented with complaints of shortness of breath chest pain and generalized swelling. She has extensive history of scleroderma and severe secondary pulmonary hypertension, which has been treated in the past with bosentan and Coumadin, and multiple other medications which has been stopped either by patient, or due to noncompliance with difficulty monitoring or other reasons. Recently she was placed on macitentan which was stopped and in March as she was having persistent diarrhea which was thought to be side effect of this medication and since then she's not on any medication related to her pulmonary hypertension. Since then she has been gradually gaining weight as well as developing volume overload, was initially given Lasix and then developed worsening renal function therefore Lasix dose was reduced but due to worsening swelling it was restarted 3 days ago. On Reglan was stopped due to frequent diarrhea. CDiff was negative and she was placed on loperamide. The patient has been developing swelling in her legs as well as in her belly since last one month this was associated with orthopnea and PND. She denies any nausea or vomiting more than her usual. She denies any abdominal pain but does complain of abdominal distention and discomfort. Today she had some substernal chest tightness which was she thinks due to difficulty breathing. It lasted for a few minutes and resolve on its own and therefore she came to the hospital.  The patient is  coming from home. And at her baseline is Independent for most of her  ADL.  Review of Systems: as mentioned in the history of present illness.  A Comprehensive review of the other systems is negative.  Past Medical History  Diagnosis Date  . Secondary pulmonary hypertension     right heart cath 04/20/04  . Cough   . Allergic rhinitis, cause unspecified   . Type II or unspecified type diabetes mellitus without mention of complication, not stated as uncontrolled   . Degeneration of lumbar or lumbosacral intervertebral disc   . Esophageal reflux   . Systemic sclerosis   . Unspecified essential hypertension   . Rotator cuff tear   . Gastritis   . Sickle cell trait   . Arthritis   . Obesity   . Visual changes   . Scleroderma   . Diastolic dysfunction   . Trichomonas   . Vaginal bleeding   . Neuropathy   . CHF (congestive heart failure)    Past Surgical History  Procedure Laterality Date  . Partial hysterectomy  1983  . Tubal ligation    . Tonsillectomy    . Upper endoscopy with biopsy  03/10/2006  . Right shoulder arthroscopy, subacromial decompression    . Cardiac catheterization  04/24/2004  . Replacement total knee  11/2000   Social History:  reports that she has never smoked. She has never used smokeless tobacco. She reports that she does not drink alcohol or use illicit drugs.  Allergies  Allergen Reactions  . Codeine     REACTION: GI upset  . Contrast Media [Iodinated Diagnostic Agents] Hives  . Iohexol  Code: HIVES, Desc: pt breaks out in large hives. needs full premeds, Onset Date: [id number]   . Cephalexin Rash  . Ciprofloxacin Rash    Family History  Problem Relation Age of Onset  . Heart disease Mother   . Diabetes Mother   . Diabetes Sister     Prior to Admission medications   Medication Sig Start Date End Date Taking? Authorizing Provider  esomeprazole (NEXIUM) 40 MG capsule Take by mouth 2 (two) times daily.     Yes Historical Provider, MD   fenofibrate (TRICOR) 48 MG tablet take 1 tablet by mouth once daily 04/11/13  Yes Sid Falcon, MD  FLUoxetine (PROZAC) 20 MG capsule take 1 capsule by mouth every morning 04/11/13  Yes Sid Falcon, MD  furosemide (LASIX) 20 MG tablet Take 1 tablet (20 mg total) by mouth daily. 06/07/13  Yes Collene Gobble, MD  gabapentin (NEURONTIN) 100 MG capsule take 1 capsule by mouth twice a day 04/11/13  Yes Marcial Pacas, MD  glucose blood test strip Check Blood Glucose 2-3 times a day.Dx Code:250.60.   Yes Sid Falcon, MD  HYDROcodone-acetaminophen [facility]) 10-325 MG per tablet Take 4 tablets by mouth every 4 (four) hours as needed for moderate pain.  07/17/12  Yes Historical Provider, MD  ibuprofen (ADVIL) 200 MG tablet Take 200 mg by mouth every 6 (six) hours as needed.     Yes Historical Provider, MD  LANOXIN 125 MCG tablet take 1 tablet by mouth once daily   Yes Fay Records, MD  lisinopril-hydrochlorothiazide [facility]) 20-25 MG per tablet take 1 tablet by mouth once daily 05/31/13  Yes Collene Gobble, MD  LYRICA 150 MG capsule take 1 capsule by mouth three times a day 03/13/13  Yes Penni Bombard, MD  Macitentan 10 MG TABS Take 1 tablet by mouth daily. "Opsumit."   Yes Historical Provider, MD  metFORMIN (GLUCOPHAGE) 500 MG tablet Take 1 tablet (500 mg total) by mouth daily with breakfast. 04/16/13 04/16/14 Yes Sid Falcon, MD  metoCLOPramide (REGLAN) 10 MG tablet Take 1/2 tablet by mouth before meals and at bedtime 10/09/12  Yes Milta Deiters, MD  ONE TOUCH LANCETS MISC Check Blood Glucose 2-3 times a day.Dx code: 250.60.   Yes Sid Falcon, MD    Physical Exam: Filed Vitals:   06/07/13 2015 06/07/13 2343 06/08/13 0045 06/08/13 0109  BP: 122/79 119/77 118/78   Pulse: 72 76  73  Temp: 98.3 F (36.8 C) 98.3 F (36.8 C) 98.2 F (36.8 C)   TempSrc: Oral Oral Oral   Resp: _0 Height:    _1  (1.626 m)  Weight:    82.7 kg (182 lb 5.1 oz)  SpO2: 97% 98% 94% 99%     General: Alert, Awake and Oriented to Time, Place and Person. Appear in moderate distress Eyes: PERRL ENT: Oral Mucosa clear moist. Neck: JVD present Cardiovascular: S1 and S2 Present, aortic systolic Murmur, Peripheral Pulses Present Respiratory: Bilateral Air entry equal and Decreased, Clear to Auscultation,  No Crackles, no wheezes Abdomen: Bowel Sound Present, Soft and distended, Non tender, discomfort present Skin: No Rash Extremities: Bilateral lower extremity Pedal edema, no calf tenderness Neurologic: Grossly Unremarkable.  Labs on Admission:  CBC:  Recent Labs Lab 06/07/13 1845  WBC 7.9  HGB 9.3*  HCT 28.7*  MCV 81.8  PLT 184    CMP     Component Value Date/Time   NA 144 06/07/2013 1845  K 5.4* 06/07/2013 1845   CL 111 06/07/2013 1845   CO2 17* 06/07/2013 1845   GLUCOSE 89 06/07/2013 1845   BUN 29* 06/07/2013 1845   CREATININE 2.00* 06/07/2013 1845   CREATININE 1.18* 04/24/2013 1142   CALCIUM 6.9* 06/07/2013 1845   PROT 7.5 06/07/2013 1845   ALBUMIN 3.1* 06/07/2013 1845   AST 29 06/07/2013 1845   ALT 14 06/07/2013 1845   ALKPHOS 162* 06/07/2013 1845   BILITOT 0.6 06/07/2013 1845   GFRNONAA 26* 06/07/2013 1845   GFRNONAA 52* 04/16/2013 0948   GFRAA 30* 06/07/2013 1845   GFRAA 60 04/16/2013 0948     Recent Labs Lab 06/07/13 1845  LIPASE 15   No results found for this basename: AMMONIA,  in the last 168 hours   Recent Labs Lab 06/07/13 1845 06/07/13 2347  TROPONINI <0.30 <0.30   BNP (last 3 results)  Recent Labs  06/07/13 1845  PROBNP 3360.0*    Radiological Exams on Admission: Ct Abdomen Pelvis Wo Contrast  06/07/2013   CLINICAL DATA:  Swelling.  Pain.  EXAM: CT ABDOMEN AND PELVIS WITHOUT CONTRAST  TECHNIQUE: Multidetector CT imaging of the abdomen and pelvis was performed following the standard protocol without intravenous contrast.  COMPARISON:  DG ABDOMEN 1V dated 09/25/2010  FINDINGS: Liver contains multiple tiny cysts and is otherwise normal.  Spleen is unremarkable. Pancreas is unremarkable. No biliary distention. The gallbladder is not distended.  Adrenals are normal. No focal renal abnormality. No hydronephrosis. No evidence of obstructing ureteral stone. The bladder is nondistended. Hysterectomy. Diffuse mild ascites.  Inguinal, iliac, and retroperitoneal prominent lymph nodes are noted. Largest retroperitoneal lymph node measures 1.6 cm, and is best seen on image number 54/series 2. These lymph nodes could be malignant. PET-CT may prove useful for further evaluation. Aorta normal caliber. No aneurysm.  Appendix is not visualized. Mild prominence of small and large bowel noted. Mild adynamic ileus cannot be excluded. There is no evidence of bowel obstruction. Stool is present in the colon. Sliding hiatal hernia is present. Gastric wall thickening cannot be excluded. Small mesenteric lymph nodes cannot be excluded. No free air.  Diffuse anasarca. No significant abdominal wall hernia. Basilar atelectasis. Small pleural effusions. Severe cardiomegaly. Moderate pericardial effusion. Diffuse degenerative changes lumbar spine.  IMPRESSION: 1. Multiple bilateral inguinal, iliac, and retroperitoneal lymph nodes. Mesenteric lymph nodes cannot be excluded. PET-CT should be considered for further evaluation.  2. Diffuse anasarca. Mild ascites. Small pleural effusions. Moderate pericardial effusion.  3.  Severe cardiomegaly.  4.  Cannot exclude mild adynamic ileus.   Electronically Signed   By: Marcello Moores  Register   On: 06/07/2013 21:01   Dg Chest 2 View  06/07/2013   CLINICAL DATA:  Chest pain.  EXAM: CHEST  2 VIEW  COMPARISON:  DG CHEST 1V PORT dated 12/26/2010  FINDINGS: Cardiomegaly with pulmonary venous congestion. No pleural effusion or pneumothorax. Poor inspiratory effort. Mild infiltrates in the lung bases cannot be excluded. No acute bony abnormality .  IMPRESSION: 1. Severe cardiomegaly.  Mild pulmonary venous congestion. 2. Poor inspiration with mild  bibasilar atelectasis and/or infiltrates.   Electronically Signed   By: Marcello Moores  Register   On: 06/07/2013 19:19    EKG: Independently reviewed. normal sinus rhythm, low-voltage.  Assessment/Plan Principal Problem:   Acute right-sided CHF (congestive heart failure) Active Problems:   DM type 2 with diabetic peripheral neuropathy   HYPERTENSION, ESSENTIAL NOS   PULMONARY HYPERTENSION, SECONDARY   GERD   Gastroparesis   SCLERODERMA  Electrolyte abnormality   AKI (acute kidney injury)   1. Acute right-sided CHF (congestive heart failure) The patient is presenting with generalized swelling. On examination she has elevated JVD, abdominal distention with ascites, lower extremity swelling. She is not hypotensive or hypoxic, she's not had tachycardic. Her EKG is negative for any acute abnormality but does have low voltage. Initial troponin is negative. Chest x-ray does show mild vascular congestion. CT of the abdomen was performed which shows presence of ascites. She has bilateral pleural effusion, moderate pericardial effusion, and cardiomegaly which was not present on prior echocardiogram in November 14. With this at this point she does not appear to have any dental not physiology with hypotension shock or tachycardia. A limited bedside echocardiogram performed by ED physician reveals moderate effusion. With this finding the patient has been discussed with patient's primary pulmonologist who will follow the patient. At present patient will be admitted to step down unit and diuresis will be attempted with IV Lasix. We will strictly monitor her I.'s and O.'s I will obtain Foley catheter. Recheck her CMP and electrolytes in the morning to monitor renal function. Most likely this is right-sided heart failure secondary to pulmonary hypertension. We'll follow serial troponin to rule out ACS. Echocardiogram in the morning as well as cardiology consult. as long as patient's blood pressure and renal  function are adequate diuresis with IV Lasix can be attempted. Check digoxin level in the morning, and resume if digoxin level are stable. His serum creatinine remains stable or improved restart ACE inhibitor. Add aspirin 81 mg.  2. electrolyte abnormality Patient has hyperkalemia, hypomagnesemia, hypocalcemia We will replace calcium and magnesium and monitor her on telemetry. Recheck in morning.  3. generalized lymphadenopathy On CT evaluation patient appears to have retrograde toenail, mesentery, inguinal lymphadenopathy. Patient will require further workup once her acute CHF is stabilized. This could very well represent inflammatory etiology but most likely possibility of lymphoma/malignancy cannot be ruled out.  4.Diabetes mellitus   placing the patient on sliding scale Holding metformin  5. acute kidney injury Patient had persistent ongoing loose bowel movements. She has been treated with Lasix in the past. At present she has generalized anasarca with possible intravascular low-volume. With this her acute kidney injury could be multifactorial. At present she is diuresing well with IV diuresis. I will recheck her CMP in the morning and closely monitor her I.'s and O's.  6. Gastroparesis The patient was on Reglan, which was stopped due to diarrhea. We'll continue to monitor.  7. Diarrhea Check C. Difficile, stool culture. No lose or bloody bowel movements.  Consults: PCCM and cardiology   DVT Prophylaxis: subcutaneous Heparin Nutrition: Cardiac and diabetic diet   Code Status: Full   Family Communication:Family  was present at bedside, opportunity was given to ask question and all questions were answered satisfactorily at the time of interview. Disposition: Admitted to inpatient in step-down unit.  Author: Berle Mull, MD [facility] Pager: [phone]  If 7PM-7AM, please contact night-coverage [email or web address] Password [email or web address]

## 2013-06-08 NOTE — Progress Notes (Signed)
CARE MANAGEMENT NOTE 06/08/2013  Patient:  [name], [name]   Account Number:  [id number]  Date Initiated:  06/08/2013  Documentation initiated by:  Malaky Tetrault  Subjective/Objective Assessment:   pt with known hx of heart failure, now with cough increased and ascities, ctscan confirmed pericardial effusion.     Action/Plan:   from home and lives with granddaughter.   Anticipated DC Date:  06/11/2013   Anticipated DC Plan:  [place]  In-house referral  NA      DC Planning Services  NA      [facility] Choice  NA   Choice offered to / List presented to:  NA   DME arranged  NA      DME agency  NA     [place] arranged  NA      [place] agency  NA   Status of service:  In process, will continue to follow Medicare Important Message given?  NA - LOS <3 / Initial given by admissions (If response is "NO", the following Medicare IM given date fields will be blank) Date Medicare IM given:   Date Additional Medicare IM given:    Discharge Disposition:    Per UR Regulation:  Reviewed for med. necessity/level of care/duration of stay  If discussed at [place] of Stay Meetings, dates discussed:    Comments:  [id number]/Lainey Nelson Eldridge Dace, BSN, [place] [phone] Chart Reviewed for discharge and hospital needs. Discharge needs at time of review:  None present will follow for needs.  May need home 02 and home heart failure program. Review of patient progress due on [id number].

## 2013-06-08 NOTE — Consult Note (Signed)
Name: [name] MRN: [id number] DOB: 1950/06/11    ADMISSION DATE:  06/07/2013 CONSULTATION DATE:  06/08/13  REFERRING MD :  Dr Marlowe Sax PRIMARY SERVICE:  [facility]  Reason for consultation:  Anasarca and dyspnea in setting decompensated PAH  SIGNIFICANT EVENTS / STUDIES:  PFT 03/30/04 >> mixed restriction and obstruction, FEV1 1.81L (73% pred), TLC 76% pred, decreased DLCO that corrects for Va TTE 10/03/07 >> normal LV size anad fxn, severe septal bowing, mild to mod LV wall thickening, dilated RA and RV, estimated PASP 102 mmHg TTE 01/24/13 >> normal LV size with concentric hypertrophy, severe RV dilation w moderately decreased fxn, severe RA dilation, estimated PASP 87 mmHg.  CT abd/pelvis 3/26 >> mild ascites, anasarca, small B pleural effusions, moderate pericardial effusion, multiple B enlarged inguinal, iliac, retroperitoneal nodes  LINES / TUBES:  CULTURES: Urine 3/26 >>   ANTIBIOTICS: none  HISTORY OF PRESENT ILLNESS:  63 y/o woman, never smoker, hx scleroderma (formerly on chronic pred 28m), HTN, DM, sickle cell trait. She has been followed by LRafael Biharifor severe secondary PAH by Dr SAlva Garnetand then by Dr BLamonte Sakaisince 11/2005. She has been on several PAH targeted therapies and coumadin over last 9 years, but has not been reliably on therapy of any kind since 09/2012 due primarily to compliance and ability to have labs performed. She has had best response to Tyvaso (+ bosentan) with an improvement in 6 minute walk and in PASP from 102 mmHg (7/09) to 42 mmHg (9/10).  Since stopping Tyvaso her PASP has risen to 80's by TTE 6/13 and 11/14. Surprisingly, she has never required supplemental O2. Most recent efforts have been to start macitentan in 11/14. She developed nausea and diarrhea and macitentan was stopped early 3/15. Her diarrhea continued (C diff negative). She was then taken off of lisinopril/HCTZ in mid-March by Dr MDaryll Drown She has had progressive edema, dyspnea, lethargy for  the last 4-6 weeks. Outpatient diuresis had minimal effect. Her S Cr has risen to 2.0 from 1.2 (04/24/13).  [facility] evidence decompensated R heart failure. CT abdomen with pericardial effusion, tiny pleural effusions, mild ascites. Also noted scattered abdominal, inguinal lymphadenopathy. PCCM consulted to assist with her care  PAST MEDICAL HISTORY :  Past Medical History  Diagnosis Date  . Secondary pulmonary hypertension     right heart cath 04/20/04  . Cough   . Allergic rhinitis, cause unspecified   . Type II or unspecified type diabetes mellitus without mention of complication, not stated as uncontrolled   . Degeneration of lumbar or lumbosacral intervertebral disc   . Esophageal reflux   . Systemic sclerosis   . Unspecified essential hypertension   . Rotator cuff tear   . Gastritis   . Sickle cell trait   . Arthritis   . Obesity   . Visual changes   . Scleroderma   . Diastolic dysfunction   . Trichomonas   . Vaginal bleeding   . Neuropathy   . CHF (congestive heart failure)    Past Surgical History  Procedure Laterality Date  . Partial hysterectomy  1983  . Tubal ligation    . Tonsillectomy    . Upper endoscopy with biopsy  03/10/2006  . Right shoulder arthroscopy, subacromial decompression    . Cardiac catheterization  04/24/2004  . Replacement total knee  11/2000   Prior to Admission medications   Medication Sig Start Date End Date Taking? Authorizing Provider  esomeprazole (NEXIUM) 40 MG  capsule Take by mouth 2 (two) times daily.     Yes Historical Provider, MD  fenofibrate (TRICOR) 48 MG tablet take 1 tablet by mouth once daily 04/11/13  Yes Sid Falcon, MD  FLUoxetine (PROZAC) 20 MG capsule take 1 capsule by mouth every morning 04/11/13  Yes Sid Falcon, MD  furosemide (LASIX) 20 MG tablet Take 1 tablet (20 mg total) by mouth daily. 06/07/13  Yes Collene Gobble, MD  gabapentin (NEURONTIN) 100 MG capsule take 1 capsule by mouth twice a day  04/11/13  Yes Marcial Pacas, MD  glucose blood test strip Check Blood Glucose 2-3 times a day.Dx Code:250.60.   Yes Sid Falcon, MD  HYDROcodone-acetaminophen [facility]) 10-325 MG per tablet Take 4 tablets by mouth every 4 (four) hours as needed for moderate pain.  07/17/12  Yes Historical Provider, MD  ibuprofen (ADVIL) 200 MG tablet Take 200 mg by mouth every 6 (six) hours as needed.     Yes Historical Provider, MD  LANOXIN 125 MCG tablet take 1 tablet by mouth once daily   Yes Fay Records, MD  lisinopril-hydrochlorothiazide [facility]) 20-25 MG per tablet take 1 tablet by mouth once daily 05/31/13  Yes Collene Gobble, MD  LYRICA 150 MG capsule take 1 capsule by mouth three times a day 03/13/13  Yes Penni Bombard, MD  Macitentan 10 MG TABS Take 1 tablet by mouth daily. "Opsumit."   Yes Historical Provider, MD  metFORMIN (GLUCOPHAGE) 500 MG tablet Take 1 tablet (500 mg total) by mouth daily with breakfast. 04/16/13 04/16/14 Yes Sid Falcon, MD  metoCLOPramide (REGLAN) 10 MG tablet Take 1/2 tablet by mouth before meals and at bedtime 10/09/12  Yes Milta Deiters, MD  ONE TOUCH LANCETS MISC Check Blood Glucose 2-3 times a day.Dx code: 250.60.   Yes Sid Falcon, MD   Allergies  Allergen Reactions  . Codeine     REACTION: GI upset  . Contrast Media [Iodinated Diagnostic Agents] Hives  . Iohexol      Code: HIVES, Desc: pt breaks out in large hives. needs full premeds, Onset Date: [id number]   . Cephalexin Rash  . Ciprofloxacin Rash    FAMILY HISTORY:  Family History  Problem Relation Age of Onset  . Heart disease Mother   . Diabetes Mother   . Diabetes Sister    SOCIAL HISTORY:  reports that she has never smoked. She has never used smokeless tobacco. She reports that she does not drink alcohol or use illicit drugs.  REVIEW OF SYSTEMS:   Constitutional: Negative for fever, chills, weight loss, malaise/fatigue and diaphoresis.  HENT: Negative for hearing loss, ear pain,  nosebleeds, congestion, sore throat, neck pain, tinnitus and ear discharge.   Eyes: Negative for blurred vision, double vision, photophobia, pain, discharge and redness.  Respiratory: Negative for cough, hemoptysis, sputum production, wheezing and stridor.  Positive for progressive shortness of breath, DOE Cardiovascular: Negative for chest pain, palpitations, orthopnea, claudication.  Positive for leg swelling and PND.  Gastrointestinal: Negative for heartburn, nausea, vomiting, abdominal pain, diarrhea, constipation, blood in stool and melena.  Genitourinary: Negative for dysuria, urgency, frequency, hematuria and flank pain.  Musculoskeletal: Negative for myalgias, back pain, joint pain and falls.  Skin: Negative for itching and rash.  Neurological: Negative for dizziness, tingling, tremors, sensory change, speech change, focal weakness, seizures, loss of consciousness, weakness and headaches.  Endo/Heme/Allergies: Negative for environmental allergies and polydipsia. Does not bruise/bleed easily.  SUBJECTIVE: Pt denies acute complaints at  present  VITAL SIGNS: Temp:  [98 F (36.7 C)-98.3 F (36.8 C)] 98.3 F (36.8 C) (03/26 2343) Pulse Rate:  [72-77] 76 (03/26 2343) Resp:  [18-26] 18 (03/26 2343) BP: (119-127)/(77-79) 119/77 mmHg (03/26 2343) SpO2:  [95 %-98 %] 98 % (03/26 2343)  PHYSICAL EXAMINATION: General:  Adult female, appears older than stated age Neuro:  AAOx4, speech clear, MAE HEENT:  Mm pink/moist Cardiovascular:  S1s2, SEM 2/6 Lungs:  resp's even/non-labored, lungs bilaterally clear Abdomen:  Round/soft, bsx4 active Musculoskeletal:  No acute deformities Skin:  Warm/dry, 2-3+ pitting edema in BLE   PULMONARY No results found for this basename: PHART, PCO2, PCO2ART, PO2, PO2ART, HCO3, TCO2, O2SAT,  in the last 168 hours  CBC  Recent Labs Lab 06/07/13 1845 06/08/13 0307 06/08/13 0455  HGB 9.3* 9.7*  --   HCT 28.7* 28.7*  --   WBC 7.9 8.0  --   PLT 184 170  178    COAGULATION  Recent Labs Lab 06/07/13 2347 06/08/13 0455  INR 1.83* 1.68*    CARDIAC   Recent Labs Lab 06/07/13 1845 06/07/13 2347 06/08/13 0454 06/08/13 1050  TROPONINI <0.30 <0.30 <0.30 <0.30    Recent Labs Lab 06/07/13 1845  PROBNP 3360.0*     CHEMISTRY  Recent Labs Lab 06/05/13 1238 06/07/13 1845 06/07/13 2347 06/08/13 0307 06/08/13 0455 06/08/13 1050  NA 140 144  --   --  141  --   K 4.7 5.4*  --   --  5.6*  --   CL 110 111  --   --  106  --   CO2 22 17*  --   --  16*  --   GLUCOSE 79 89  --   --  76  --   BUN 20 29*  --   --  29*  --   CREATININE 1.6* 2.00*  --   --  1.89*  --   CALCIUM 6.9* 6.9*  --   --  7.5*  --   MG  --   --  0.9* 0.6*  --  1.6   Estimated Creatinine Clearance: 32.5 ml/min (by C-G formula based on Cr of 1.89).   LIVER  Recent Labs Lab 06/05/13 1238 06/07/13 1845 06/07/13 2347 06/08/13 0455  AST 35 29  --  44*  ALT 15 14  --  13  ALKPHOS 142* 162*  --  159*  BILITOT 0.8 0.6  --  0.8  PROT 6.9 7.5  --  7.3  ALBUMIN 3.1* 3.1*  --  3.0*  INR  --   --  1.83* 1.68*     INFECTIOUS No results found for this basename: LATICACIDVEN, PROCALCITON,  in the last 168 hours   ENDOCRINE CBG (last 3)   Recent Labs  06/08/13 0205 06/08/13 0737 06/08/13 1129  GLUCAP 78 76 93         IMAGING x48h  Ct Abdomen Pelvis Wo Contrast  06/07/2013   CLINICAL DATA:  Swelling.  Pain.  EXAM: CT ABDOMEN AND PELVIS WITHOUT CONTRAST  TECHNIQUE: Multidetector CT imaging of the abdomen and pelvis was performed following the standard protocol without intravenous contrast.  COMPARISON:  DG ABDOMEN 1V dated 09/25/2010  FINDINGS: Liver contains multiple tiny cysts and is otherwise normal. Spleen is unremarkable. Pancreas is unremarkable. No biliary distention. The gallbladder is not distended.  Adrenals are normal. No focal renal abnormality. No hydronephrosis. No evidence of obstructing ureteral stone. The bladder is nondistended.  Hysterectomy. Diffuse mild ascites.  Inguinal,  iliac, and retroperitoneal prominent lymph nodes are noted. Largest retroperitoneal lymph node measures 1.6 cm, and is best seen on image number 54/series 2. These lymph nodes could be malignant. PET-CT may prove useful for further evaluation. Aorta normal caliber. No aneurysm.  Appendix is not visualized. Mild prominence of small and large bowel noted. Mild adynamic ileus cannot be excluded. There is no evidence of bowel obstruction. Stool is present in the colon. Sliding hiatal hernia is present. Gastric wall thickening cannot be excluded. Small mesenteric lymph nodes cannot be excluded. No free air.  Diffuse anasarca. No significant abdominal wall hernia. Basilar atelectasis. Small pleural effusions. Severe cardiomegaly. Moderate pericardial effusion. Diffuse degenerative changes lumbar spine.  IMPRESSION: 1. Multiple bilateral inguinal, iliac, and retroperitoneal lymph nodes. Mesenteric lymph nodes cannot be excluded. PET-CT should be considered for further evaluation.  2. Diffuse anasarca. Mild ascites. Small pleural effusions. Moderate pericardial effusion.  3.  Severe cardiomegaly.  4.  Cannot exclude mild adynamic ileus.   Electronically Signed   By: Marcello Moores  Register   On: 06/07/2013 21:01   Dg Chest 2 View  06/07/2013   CLINICAL DATA:  Chest pain.  EXAM: CHEST  2 VIEW  COMPARISON:  DG CHEST 1V PORT dated 12/26/2010  FINDINGS: Cardiomegaly with pulmonary venous congestion. No pleural effusion or pneumothorax. Poor inspiratory effort. Mild infiltrates in the lung bases cannot be excluded. No acute bony abnormality .  IMPRESSION: 1. Severe cardiomegaly.  Mild pulmonary venous congestion. 2. Poor inspiration with mild bibasilar atelectasis and/or infiltrates.   Electronically Signed   By: Marcello Moores  Register   On: 06/07/2013 19:19       ASSESSMENT / PLAN:  1. Acute on chronic R heart failure - Agree with slow diuresis, will be complicated by her  cardiorenal syndrome. May need to change to lasix gtt and use inotropes to facilitate, either milrinone or dobutamine - follow S Cr with diuresis - CVP or PA-c may be useful here to guide volume status (she will need adequate filling pressures, is at risk for decreased CO if volume removed too quickly or to much) - repeat TTE to compare with priors and to r/o tamponade physiology - ? Need to restart ACE-I / HCTZ vs other BP control - cardiology input regarding the above as well as pericardial effusion > suspect due to total body volume overload. Scleroderma does rarely cause pericarditis and an associated effusion.  - confirm no walking desaturation prior to d/c home.   2. Severe secondary PAH - Needs to be back on targeted therapy. Has tolerated bosentan but stopped due to poor compliance with LFT checks. Suspect she will need to restart Tyvaso vs be escalated to IV therapy. Her compliance makes her a questionable candidate for the latter. Will stress the importance of restarting therapy - optimally would restart coumadin long term  3. Scleroderma  - consider restart Prednisone, although little evidence for progression of auto-immune disease  4. Pelvic and retroperitoneal LAD, etiology unclear. This is not typical for scleroderma.  - she will likely need nodal bx. Would perform soon once hemodynamics are stabilized.   5.  Hypomagnesemia -replace mag 4gm with repeat in evening   Noe Gens, NP-C [facility] Pgr: [phone] or [phone] Time 0:34am 06/08/13   STAFF NOTE: I, Dr Ann Lions have personally reviewed patient's available data, including medical history, events of note, physical examination and test results as part of my evaluation. I have discussed with resident/NP and other care providers such as pharmacist,  RN and RRT.  In addition,  I personally evaluated patient and elicited key findings of hyperkalemia - will recheck K. Pericardial effusion  - consult  cards. Depending on course might need [place] tx for ? IV flolan. Will check Lactate and PCT as well. PCCM will assumer primary.  Rest per NP/medical resident whose note is outlined above and that I agree with   Dr. Brand Males, M.D., [facility].C.P Pulmonary and Critical Care Medicine Staff Physician [place] [facility] Pager: [phone], If no answer or between  15:00h - 7:00h: call [phone]  06/08/2013 12:29 PM

## 2013-06-08 NOTE — Progress Notes (Signed)
  Echocardiogram 2D Echocardiogram has been performed.  [name] 06/08/2013, 12:28 PM

## 2013-06-08 NOTE — Progress Notes (Signed)
[facility] PCCM  Called by rounding MD.  Echo today shows worsening PA pressures (estimated).  Discussed with advance heart failure service.  Will transfer to [facility] for [place] and further management.  Patient and family updated by camera.  Jillyn Hidden PCCM Pager: [phone] Cell: [phone] If no response, call [phone]

## 2013-06-09 LAB — BASIC METABOLIC PANEL
BUN: 29 mg/dL — ABNORMAL HIGH (ref 6–23)
CO2: 21 mEq/L (ref 19–32)
Calcium: 8.4 mg/dL (ref 8.4–10.5)
Chloride: 101 mEq/L (ref 96–112)
Creatinine, Ser: 1.74 mg/dL — ABNORMAL HIGH (ref 0.50–1.10)
GFR calc Af Amer: 35 mL/min — ABNORMAL LOW (ref >90)
GFR, EST NON AFRICAN AMERICAN: 30 mL/min — AB (ref >90)
Glucose, Bld: 106 mg/dL — ABNORMAL HIGH (ref 70–99)
Potassium: 4.5 mEq/L (ref 3.7–5.3)
SODIUM: 139 meq/L (ref 137–147)

## 2013-06-09 LAB — GLUCOSE, CAPILLARY
GLUCOSE-CAPILLARY: 93 mg/dL (ref 70–99)
Glucose-Capillary: 133 mg/dL — ABNORMAL HIGH (ref 70–99)
Glucose-Capillary: 102 mg/dL — ABNORMAL HIGH (ref 70–99)
Glucose-Capillary: 123 mg/dL — ABNORMAL HIGH (ref 70–99)
Glucose-Capillary: 99 mg/dL (ref 70–99)

## 2013-06-09 LAB — PRO B NATRIURETIC PEPTIDE: Pro B Natriuretic peptide (BNP): 2707 pg/mL — ABNORMAL HIGH (ref 0–125)

## 2013-06-09 LAB — MAGNESIUM: MAGNESIUM: 1.3 mg/dL — AB (ref 1.5–2.5)

## 2013-06-09 LAB — PROCALCITONIN: Procalcitonin: <0.1 ng/mL

## 2013-06-09 LAB — URINE CULTURE
COLONY COUNT: NO GROWTH
Culture: NO GROWTH

## 2013-06-09 LAB — PHOSPHORUS: PHOSPHORUS: 4 mg/dL (ref 2.3–4.6)

## 2013-06-09 MED ORDER — TADALAFIL (PAH) 20 MG PO TABS
20.0000 mg | ORAL_TABLET | Freq: Every day | ORAL | Status: DC
  Administered 2013-06-09 – 2013-06-10 (×2): 20 mg via ORAL
  Filled 2013-06-09 (×2): qty 1

## 2013-06-09 MED ORDER — MAGNESIUM SULFATE 40 MG/ML IJ SOLN
2.0000 g | Freq: Once | INTRAMUSCULAR | Status: AC
  Administered 2013-06-09: 2 g via INTRAVENOUS
  Filled 2013-06-09 (×2): qty 50

## 2013-06-09 MED ORDER — SODIUM CHLORIDE 0.9 % IV SOLN: INTRAVENOUS | Status: DC | PRN

## 2013-06-09 NOTE — Consult Note (Signed)
Name: [name] MRN: [id number] DOB: May 07, 1950    ADMISSION DATE:  06/07/2013 CONSULTATION DATE:  06/08/13  REFERRING MD :  Dr Marlowe Sax PRIMARY SERVICE:  [facility]  Reason for consultation:  Anasarca and dyspnea in setting decompensated PAH  SIGNIFICANT EVENTS / STUDIES:  PFT 03/30/04 >> mixed restriction and obstruction, FEV1 1.81L (73% pred), TLC 76% pred, decreased DLCO that corrects for Va TTE 10/03/07 >> normal LV size anad fxn, severe septal bowing, mild to mod LV wall thickening, dilated RA and RV, estimated PASP 102 mmHg TTE 01/24/13 >> normal LV size with concentric hypertrophy, severe RV dilation w moderately decreased fxn, severe RA dilation, estimated PASP 87 mmHg.  CT abd/pelvis 3/26 >> mild ascites, anasarca, small B pleural effusions, moderate pericardial effusion, multiple B enlarged inguinal, iliac, retroperitoneal nodes  LINES / TUBES: PIV  CULTURES: Urine 3/26 >>   ANTIBIOTICS: none  SUBJECTIVE: no events overnight, feels a little better.  VITAL SIGNS: Temp:  [97 F (36.1 C)-98.3 F (36.8 C)] 98 F (36.7 C) (03/28 0400) Pulse Rate:  [54-79] 60 (03/28 0700) Resp:  [11-26] 11 (03/28 0700) BP: (86-145)/(37-93) 95/50 mmHg (03/28 0700) SpO2:  [95 %-100 %] 95 % (03/28 0700) Weight:  [165 lb 5.5 oz (75 kg)-174 lb 6.1 oz (79.1 kg)] 165 lb 5.5 oz (75 kg) (03/28 0500)  PHYSICAL EXAMINATION: General:  Adult female, appears older than stated age Neuro:  AAOx4, speech clear, MAE HEENT:  Mm pink/moist Cardiovascular:  S1s2, SEM 2/6 Lungs:  resp's even/non-labored, lungs bilaterally clear Abdomen:  Round/soft, bsx4 active Musculoskeletal:  No acute deformities Skin:  Warm/dry, 2-3+ pitting edema in BLE  PULMONARY No results found for this basename: PHART, PCO2, PCO2ART, PO2, PO2ART, HCO3, TCO2, O2SAT,  in the last 168 hours  CBC  Recent Labs Lab 06/07/13 1845 06/08/13 0307 06/08/13 0455  HGB 9.3* 9.7*  --   HCT 28.7* 28.7*  --   WBC 7.9 8.0  --   PLT  184 170 178    COAGULATION  Recent Labs Lab 06/07/13 2347 06/08/13 0455  INR 1.83* 1.68*    CARDIAC    Recent Labs Lab 06/07/13 1845 06/07/13 2347 06/08/13 0454 06/08/13 1050  TROPONINI <0.30 <0.30 <0.30 <0.30    Recent Labs Lab 06/07/13 1845  PROBNP 3360.0*     CHEMISTRY  Recent Labs Lab 06/05/13 1238 06/07/13 1845 06/07/13 2347 06/08/13 0307 06/08/13 0455 06/08/13 1050 06/08/13 1600 06/09/13 0825  NA 140 144  --   --  141  --  140 139  K 4.7 5.4*  --   --  5.6*  --  5.0 4.5  CL 110 111  --   --  106  --  104 101  CO2 22 17*  --   --  16*  --  22 21  GLUCOSE 79 89  --   --  76  --  95 106*  BUN 20 29*  --   --  29*  --  29* 29*  CREATININE 1.6* 2.00*  --   --  1.89*  --  1.85* 1.74*  CALCIUM 6.9* 6.9*  --   --  7.5*  --  8.0* 8.4  MG  --   --  0.9* 0.6*  --  1.6  --  1.3*  PHOS  --   --   --   --   --   --   --  4.0   Estimated Creatinine Clearance: 33.7 ml/min (by C-G formula based  on Cr of 1.74).   LIVER  Recent Labs Lab 06/05/13 1238 06/07/13 1845 06/07/13 2347 06/08/13 0455  AST 35 29  --  44*  ALT 15 14  --  13  ALKPHOS 142* 162*  --  159*  BILITOT 0.8 0.6  --  0.8  PROT 6.9 7.5  --  7.3  ALBUMIN 3.1* 3.1*  --  3.0*  INR  --   --  1.83* 1.68*     INFECTIOUS  Recent Labs Lab 06/08/13 1050  PROCALCITON <0.10     ENDOCRINE CBG (last 3)   Recent Labs  06/08/13 1129 06/08/13 2129 06/09/13 0900  GLUCAP 93 110* 133*         IMAGING x48h  Ct Abdomen Pelvis Wo Contrast  06/07/2013   CLINICAL DATA:  Swelling.  Pain.  EXAM: CT ABDOMEN AND PELVIS WITHOUT CONTRAST  TECHNIQUE: Multidetector CT imaging of the abdomen and pelvis was performed following the standard protocol without intravenous contrast.  COMPARISON:  DG ABDOMEN 1V dated 09/25/2010  FINDINGS: Liver contains multiple tiny cysts and is otherwise normal. Spleen is unremarkable. Pancreas is unremarkable. No biliary distention. The gallbladder is not distended.   Adrenals are normal. No focal renal abnormality. No hydronephrosis. No evidence of obstructing ureteral stone. The bladder is nondistended. Hysterectomy. Diffuse mild ascites.  Inguinal, iliac, and retroperitoneal prominent lymph nodes are noted. Largest retroperitoneal lymph node measures 1.6 cm, and is best seen on image number 54/series 2. These lymph nodes could be malignant. PET-CT may prove useful for further evaluation. Aorta normal caliber. No aneurysm.  Appendix is not visualized. Mild prominence of small and large bowel noted. Mild adynamic ileus cannot be excluded. There is no evidence of bowel obstruction. Stool is present in the colon. Sliding hiatal hernia is present. Gastric wall thickening cannot be excluded. Small mesenteric lymph nodes cannot be excluded. No free air.  Diffuse anasarca. No significant abdominal wall hernia. Basilar atelectasis. Small pleural effusions. Severe cardiomegaly. Moderate pericardial effusion. Diffuse degenerative changes lumbar spine.  IMPRESSION: 1. Multiple bilateral inguinal, iliac, and retroperitoneal lymph nodes. Mesenteric lymph nodes cannot be excluded. PET-CT should be considered for further evaluation.  2. Diffuse anasarca. Mild ascites. Small pleural effusions. Moderate pericardial effusion.  3.  Severe cardiomegaly.  4.  Cannot exclude mild adynamic ileus.   Electronically Signed   By: Marcello Moores  Register   On: 06/07/2013 21:01   Dg Chest 2 View  06/07/2013   CLINICAL DATA:  Chest pain.  EXAM: CHEST  2 VIEW  COMPARISON:  DG CHEST 1V PORT dated 12/26/2010  FINDINGS: Cardiomegaly with pulmonary venous congestion. No pleural effusion or pneumothorax. Poor inspiratory effort. Mild infiltrates in the lung bases cannot be excluded. No acute bony abnormality .  IMPRESSION: 1. Severe cardiomegaly.  Mild pulmonary venous congestion. 2. Poor inspiration with mild bibasilar atelectasis and/or infiltrates.   Electronically Signed   By: Marcello Moores  Register   On: 06/07/2013  19:19       ASSESSMENT / PLAN:  1. Acute on chronic R heart failure - Milrinone and lasix drips. - Follow S Cr with diuresis - Willa void central access for now, would consider a PICC line if a CVP is needed. - TTE per cards. - Appreciate input from HF team.  - Will need an ambulatory desaturation study prior to discharge. - Titrate O2 for sat of 90-92%.  2. Severe secondary PAH - Needs to be back on targeted therapy. Has tolerated bosentan but stopped due to poor compliance with  LFT checks.  - The primary issue here is compliance with medications. - Optimally would restart coumadin long term.  3. Scleroderma  - No need for steroids at this point, seems auto-immune disease is under control.  4. Pelvic and retroperitoneal LAD, etiology unclear. This is not typical for scleroderma.  - She will likely need nodal bx. Would perform soon once hemodynamics are stabilized.   5.  Hypomagnesemia - Replace and recheck.  Had an extensive conversation with patient regarding code status.  She is capable of making decisions but her very poor insight into her disease process and would just rather "live her life" and that "when God calls" she would just rather go.  Discussed specifically life support, intubation, CPR and cardioversion and patient clearly stated she does not wish such interventions.  Will make a LCB with no CPR/cardioversion/intubation.  Will transfer to SDU and back to [facility] service.  PCCM will be available as needed.  Rush Farmer, M.D. [facility] Pulmonary/Critical Care Medicine. Pager: [phone]. After hours pager: [phone].

## 2013-06-09 NOTE — Progress Notes (Addendum)
Advanced Heart Failure Team Consult Note   Reason for Consultation: PAH management  HPI:    [name] is a 63 y/o woman, never smoker, hx scleroderma (formerly on chronic pred 8m), HTN, DM, sickle cell trait. She has been followed by LRafael Biharifor severe secondary PAH by Dr SAlva Garnetand then by Dr BLamonte Sakaisince 11/2005. She has been on several PAH targeted therapies and coumadin over last 9 years, but has not been reliably on therapy of any kind since 09/2012 due primarily to compliance and ability to have labs performed. She has had best response to Tyvaso (+ bosentan) with an improvement in 6 minute walk and in PASP from 102 mmHg (7/09) to 42 mmHg (9/10). Since stopping Tyvaso her PASP has risen to 80's by TTE 6/13 and 11/14. Surprisingly, she has never required supplemental O2. Most recent efforts have been to start macitentan in 11/14. She developed nausea and diarrhea and macitentan was stopped early 3/15.   Admitted 3/26 with decompensated R heart failure.Echo reviewed. LVEF ~50% Severe RV dysfunction with evidence of cor pulmonale RVSP 930mG. Small posterior pericardial effusion  Started on IV lasix and milrinone on 3/27. Diuresed 7L overnight. Weight down almost 10 pounds. More alert. Breathing better. Made limited code this am by CCM.   Lives with 1613/o granddaughter. No family help available.     Objective:    Vital Signs:   Temp:  [98 F (36.7 C)-98.3 F (36.8 C)] 98 F (36.7 C) (03/28 0400) Pulse Rate:  [54-79] 60 (03/28 0700) Resp:  [11-26] 11 (03/28 0700) BP: (86-145)/(37-93) 95/50 mmHg (03/28 0700) SpO2:  [95 %-100 %] 95 % (03/28 0700) Weight:  [75 kg (165 lb 5.5 oz)-79.1 kg (174 lb 6.1 oz)] 75 kg (165 lb 5.5 oz) (03/28 0500) Last BM Date: 06/08/13  Weight change: Filed Weights   06/08/13 0109 06/08/13 1745 06/09/13 0500  Weight: 82.7 kg (182 lb 5.1 oz) 79.1 kg (174 lb 6.1 oz) 75 kg (165 lb 5.5 oz)    Intake/Output:   Intake/Output Summary (Last 24 hours) at  06/09/13 1120 Last data filed at 06/09/13 0700  Gross per 24 hour  Intake 306.82 ml  Output   6525 ml  Net -6218.18 ml     Physical Exam: General:  Alert No resp difficulty HEENT: normal Neck: supple. JVP to ear  Carotids 2+ bilat; no bruits. No lymphadenopathy or thryomegaly appreciated. Cor: +RV lift Regular rate & rhythm. 3/6 TR. Loud P2 Lungs: clear Abdomen: soft, nontender, + distended. Liver edge down ~4cm. No bruits or masses. Good bowel sounds. Extremities: no cyanosis, clubbing, rash, 2+  edema Neuro: alert & orientedx3, cranial nerves grossly intact. moves all 4 extremities w/o difficulty. Affect pleasant  Telemetry: SR 60-70  Labs: Basic Metabolic Panel:  Recent Labs Lab 06/05/13 1238 06/07/13 1845 06/07/13 2347 06/08/13 0307 06/08/13 0455 06/08/13 1050 06/08/13 1600 06/09/13 0825  NA 140 144  --   --  141  --  140 139  K 4.7 5.4*  --   --  5.6*  --  5.0 4.5  CL 110 111  --   --  106  --  104 101  CO2 22 17*  --   --  16*  --  22 21  GLUCOSE 79 89  --   --  76  --  95 106*  BUN 20 29*  --   --  29*  --  29* 29*  CREATININE 1.6* 2.00*  --   --  1.89*  --  1.85* 1.74*  CALCIUM 6.9* 6.9*  --   --  7.5*  --  8.0* 8.4  MG  --   --  0.9* 0.6*  --  1.6  --  1.3*  PHOS  --   --   --   --   --   --   --  4.0    Liver Function Tests:  Recent Labs Lab 06/05/13 1238 06/07/13 1845 06/08/13 0455  AST 35 29 44*  ALT _0 ALKPHOS 142* 162* 159*  BILITOT 0.8 0.6 0.8  PROT 6.9 7.5 7.3  ALBUMIN 3.1* 3.1* 3.0*    Recent Labs Lab 06/07/13 1845  LIPASE 15   No results found for this basename: AMMONIA,  in the last 168 hours  CBC:  Recent Labs Lab 06/07/13 1845 06/08/13 0307 06/08/13 0455  WBC 7.9 8.0  --   NEUTROABS  --  3.2  --   HGB 9.3* 9.7*  --   HCT 28.7* 28.7*  --   MCV 81.8 80.2  --   PLT 184 170 178    Cardiac Enzymes:  Recent Labs Lab 06/07/13 1845 06/07/13 2347 06/08/13 0454 06/08/13 1050  TROPONINI <0.30 <0.30 <0.30 <0.30     BNP: BNP (last 3 results)  Recent Labs  06/07/13 1845 06/09/13 0825  PROBNP 3360.0* 2707.0*    CBG:  Recent Labs Lab 06/08/13 0737 06/08/13 1129 06/08/13 1733 06/08/13 2129 06/09/13 0900  GLUCAP 76 93 93 110* 133*    Coagulation Studies:  Recent Labs  06/07/13 2347 06/08/13 0455  LABPROT 20.6* 19.3*  INR 1.83* 1.68*    Other results: EKG: SR 73. R-axis. RVH  Imaging: Ct Abdomen Pelvis Wo Contrast  06/07/2013   CLINICAL DATA:  Swelling.  Pain.  EXAM: CT ABDOMEN AND PELVIS WITHOUT CONTRAST  TECHNIQUE: Multidetector CT imaging of the abdomen and pelvis was performed following the standard protocol without intravenous contrast.  COMPARISON:  DG ABDOMEN 1V dated 09/25/2010  FINDINGS: Liver contains multiple tiny cysts and is otherwise normal. Spleen is unremarkable. Pancreas is unremarkable. No biliary distention. The gallbladder is not distended.  Adrenals are normal. No focal renal abnormality. No hydronephrosis. No evidence of obstructing ureteral stone. The bladder is nondistended. Hysterectomy. Diffuse mild ascites.  Inguinal, iliac, and retroperitoneal prominent lymph nodes are noted. Largest retroperitoneal lymph node measures 1.6 cm, and is best seen on image number 54/series 2. These lymph nodes could be malignant. PET-CT may prove useful for further evaluation. Aorta normal caliber. No aneurysm.  Appendix is not visualized. Mild prominence of small and large bowel noted. Mild adynamic ileus cannot be excluded. There is no evidence of bowel obstruction. Stool is present in the colon. Sliding hiatal hernia is present. Gastric wall thickening cannot be excluded. Small mesenteric lymph nodes cannot be excluded. No free air.  Diffuse anasarca. No significant abdominal wall hernia. Basilar atelectasis. Small pleural effusions. Severe cardiomegaly. Moderate pericardial effusion. Diffuse degenerative changes lumbar spine.  IMPRESSION: 1. Multiple bilateral inguinal, iliac, and  retroperitoneal lymph nodes. Mesenteric lymph nodes cannot be excluded. PET-CT should be considered for further evaluation.  2. Diffuse anasarca. Mild ascites. Small pleural effusions. Moderate pericardial effusion.  3.  Severe cardiomegaly.  4.  Cannot exclude mild adynamic ileus.   Electronically Signed   By: Marcello Moores  Register   On: 06/07/2013 21:01   Dg Chest 2 View  06/07/2013   CLINICAL DATA:  Chest pain.  EXAM: CHEST  2 VIEW  COMPARISON:  DG CHEST 1V PORT dated 12/26/2010  FINDINGS: Cardiomegaly with pulmonary venous congestion. No pleural effusion or pneumothorax. Poor inspiratory effort. Mild infiltrates in the lung bases cannot be excluded. No acute bony abnormality .  IMPRESSION: 1. Severe cardiomegaly.  Mild pulmonary venous congestion. 2. Poor inspiration with mild bibasilar atelectasis and/or infiltrates.   Electronically Signed   By: Marcello Moores  Register   On: 06/07/2013 19:19     Medications:     Current Medications: . aspirin EC  81 mg Oral Daily  . gabapentin  100 mg Oral BID  . heparin  5,000 Units Subcutaneous 3 times per day  . insulin aspart  0-5 Units Subcutaneous QHS  . insulin aspart  0-9 Units Subcutaneous TID WC  . pantoprazole  40 mg Oral BID  . pregabalin  150 mg Oral TID  . sodium chloride  3 mL Intravenous Q12H    Infusions: . furosemide (LASIX) infusion 10 mg/hr (06/08/13 2132)  . milrinone 0.25 mcg/kg/min (06/09/13 7614)     Assessment:   1. PAH with cor pulmonale 2. Scleroderma 3. DM 4. Small pericardial effusion 5. Noncompliance 6. CKD, stage III - baseline Cr 1.6 7. DNR/DNI  Plan/Discussion:     She presents with decompensated/end-stage RHF with marked volume overload  in setting of severe PAH and medication noncompliance. Echo reviewed personally and PA pressures continue to climb. She previously had very good response to Tyvaso but her course has been complicated by severe noncompliance and refusal to take some of her meds and comply with LFT  monitoring. She has very limited insight into her situation and combined with her lack of compliance and home support, she is not a candidate for IV therapies.   She has responded well to milrinone and IV lasix gtt. Will continue for at least another 2-3 days. She is not candidate for home milrinone or Flolan. She refuses inhaled agents or Tyvaso. Cannot use bosentan as she won't f/u with LFT checks. Will switch revatio to Adcirca 20 (once a day) - titrate to 40 daily tomorrow - and would consider adding Letaris after d/c as no LFT monitoring required.   I discussed Hospice with her and she refuses at this point.   The advanced HF team will continue to follow and assist with management of her PAH/corpulmonale.  Saori Umholtz,MD 11:20 AM  Length of Stay: 2 Advanced Heart Failure Team Pager [phone] (M-F; 7a - 4p)  Please contact [facility] for night-coverage after hours (4p -7a ) and weekends on [email or web address]

## 2013-06-10 LAB — GLUCOSE, CAPILLARY
GLUCOSE-CAPILLARY: 104 mg/dL — AB (ref 70–99)
Glucose-Capillary: 110 mg/dL — ABNORMAL HIGH (ref 70–99)
Glucose-Capillary: 101 mg/dL — ABNORMAL HIGH (ref 70–99)
Glucose-Capillary: 132 mg/dL — ABNORMAL HIGH (ref 70–99)

## 2013-06-10 LAB — BASIC METABOLIC PANEL
BUN: 32 mg/dL — ABNORMAL HIGH (ref 6–23)
CO2: 22 meq/L (ref 19–32)
Calcium: 9 mg/dL (ref 8.4–10.5)
Chloride: 94 mEq/L — ABNORMAL LOW (ref 96–112)
Creatinine, Ser: 1.89 mg/dL — ABNORMAL HIGH (ref 0.50–1.10)
GFR calc Af Amer: 32 mL/min — ABNORMAL LOW (ref >90)
GFR, EST NON AFRICAN AMERICAN: 28 mL/min — AB (ref >90)
GLUCOSE: 103 mg/dL — AB (ref 70–99)
Potassium: 4.1 mEq/L (ref 3.7–5.3)
SODIUM: 134 meq/L — AB (ref 137–147)

## 2013-06-10 LAB — PROCALCITONIN: Procalcitonin: 0.14 ng/mL

## 2013-06-10 LAB — PHOSPHORUS: PHOSPHORUS: 4 mg/dL (ref 2.3–4.6)

## 2013-06-10 LAB — MAGNESIUM: Magnesium: 1.3 mg/dL — ABNORMAL LOW (ref 1.5–2.5)

## 2013-06-10 LAB — CBC
HEMATOCRIT: 32.2 % — AB (ref 36.0–46.0)
Hemoglobin: 11.1 g/dL — ABNORMAL LOW (ref 12.0–15.0)
MCH: 27.5 pg (ref 26.0–34.0)
MCHC: 34.5 g/dL (ref 30.0–36.0)
MCV: 79.7 fL (ref 78.0–100.0)
Platelets: 231 10*3/uL (ref 150–400)
RBC: 4.04 MIL/uL (ref 3.87–5.11)
RDW: 18.9 % — AB (ref 11.5–15.5)
WBC: 11.5 10*3/uL — AB (ref 4.0–10.5)

## 2013-06-10 MED ORDER — TADALAFIL (PAH) 20 MG PO TABS: 40.0000 mg | ORAL_TABLET | Freq: Every day | ORAL | Status: DC

## 2013-06-10 MED ORDER — TADALAFIL (PAH) 20 MG PO TABS
40.0000 mg | ORAL_TABLET | Freq: Every day | ORAL | Status: DC
  Administered 2013-06-11 – 2013-06-14 (×4): 40 mg via ORAL
  Filled 2013-06-10 (×5): qty 2

## 2013-06-10 MED ORDER — SALINE SPRAY 0.65 % NA SOLN
1.0000 | NASAL | Status: DC | PRN
  Filled 2013-06-10: qty 44

## 2013-06-10 MED ORDER — MAGNESIUM SULFATE 40 MG/ML IJ SOLN
2.0000 g | Freq: Once | INTRAMUSCULAR | Status: AC
  Administered 2013-06-10: 2 g via INTRAVENOUS
  Filled 2013-06-10: qty 50

## 2013-06-10 NOTE — Evaluation (Signed)
Physical Therapy Evaluation Patient Details Name: [name] MRN: [id number] DOB: March 26, 1950 Today's Date: 06/10/2013   History of Present Illness  63 y/o female admitted with decopmpensated R heart failure and dyspnea.  Clinical Impression  Pt adm with the above dx. Pt's functional mobility is limited to fatigue and weakness. Pt lacks family availability for home assistance. Pt lives with granddaughter but she is not available to provide the amount of supervision recommended. Pt to benefit from HHPT with supervision pending pt progression during hospital stay. If appropriate supervision/assistance is not available to home pt to benefit from SNF to maximize independence for safe return home. Pt to benefit from skilled acute PT to address limitations listed below.     Follow Up Recommendations Home health PT;Supervision/Assistance - 24 hour (pending pt progression, SNF may be appropriate )    Equipment Recommendations  Rolling walker with 5" wheels    Recommendations for Other Services       Precautions / Restrictions Precautions Precautions: Fall Restrictions Weight Bearing Restrictions: No      Mobility  Bed Mobility Overal bed mobility: Needs Assistance Bed Mobility: Supine to Sit     Supine to sit: Min assist     General bed mobility comments: min A advancing hips so that feet flat on ground. verbal and tactile cues for safety and sequencing. pt reports some dizziness sitting upright   Transfers Overall transfer level: Needs assistance Equipment used: Rolling walker (2 wheeled) Transfers: Sit to/from Stand Sit to Stand: Min assist         General transfer comment: pt req inc time to come to complete standing position. verbal and tactuile cues to A with hip extension and achieving upright standing position. pt complains of feeling a l"little dizzy" upon standing  Ambulation/Gait Ambulation/Gait assistance: Min assist Ambulation Distance (Feet): 125  Feet Assistive device: Rolling walker (2 wheeled) Gait Pattern/deviations: Step-through pattern;Decreased stride length;Drifts right/left;Narrow base of support Gait velocity: slow; guarded for falls   General Gait Details: pt drifts towards the L req min A and verbal cues to correct and maintain stright direction. pt reports LE weakness and uneasines amb. pt req one standing rest break.    Stairs            Wheelchair Mobility    Modified Rankin (Stroke Patients Only)       Balance Overall balance assessment: Needs assistance Sitting-balance support: Feet supported;Bilateral upper extremity supported Sitting balance-Leahy Scale: Fair Sitting balance - Comments: pt able to sit EOB for 5 min prior to amb   Standing balance support: Bilateral upper extremity supported Standing balance-Leahy Scale: Poor Standing balance comment: pt req Bilat UE support to stabilize standing balance. pt slow to stand and req verbal and tactile cues to achieve upright position. pt reports LE weakness and dizziness upon standing.                       Pertinent Vitals/Pain BP seated: 78/45 BP standing: 81/49    Home Living Family/patient expects to be discharged to:: Private residence Living Arrangements: Other relatives (granddaughter. not available all the time) Available Help at Discharge: Other (Comment);Family Type of Home: House Home Access: Stairs to enter Entrance Stairs-Rails: None Entrance Stairs-Number of Steps: 2 Home Layout: One level Home Equipment: None Additional Comments: family support is limited. possibly able to provide 24 hr supervision for 2 days    Prior Function Level of Independence: Independent  Hand Dominance        Extremity/Trunk Assessment   Upper Extremity Assessment: Generalized weakness           Lower Extremity Assessment: Generalized weakness         Communication   Communication: No difficulties  Cognition  Arousal/Alertness: Awake/alert Behavior During Therapy: Flat affect Overall Cognitive Status: Within Functional Limits for tasks assessed                      General Comments General comments (skin integrity, edema, etc.): pt's low BP similar to previous BPs during LOS.    Exercises        Assessment/Plan    PT Assessment Patient needs continued PT services  PT Diagnosis Difficulty walking;Generalized weakness   PT Problem List Decreased strength;Decreased range of motion;Decreased activity tolerance;Decreased balance;Decreased mobility;Decreased coordination;Decreased knowledge of use of DME;Decreased safety awareness  PT Treatment Interventions DME instruction;Gait training;Stair training;Functional mobility training;Therapeutic activities;Therapeutic exercise;Balance training;Patient/family education   PT Goals (Current goals can be found in the Care Plan section) Acute Rehab PT Goals Patient Stated Goal: "i just want to go home" PT Goal Formulation: With patient Time For Goal Achievement: 06/17/13 Potential to Achieve Goals: Good    Frequency Min 3X/week   Barriers to discharge Decreased caregiver support pending pt progression pt may benefit from SNF to maximize independence before returning home alone with lack of familiy support    End of Session Equipment Utilized During Treatment: Gait belt Activity Tolerance: Patient limited by fatigue Patient left: in chair;with call bell/phone within reach;with family/visitor present         Time: 1683-7290 PT Time Calculation (min): 34 min   Charges:         PT G Codes:          Windel Keziah SPT 06/10/2013, 4:17 PM

## 2013-06-10 NOTE — Progress Notes (Addendum)
[facility] Progress Note [place] TEAM 1 - Stepdown/ICU TEAM   [name] XHF:[id number] DOB: 09/24/1950 DOA: 06/07/2013 PCP: Gilles Chiquito, MD  Brief narrative: [name] is a 64 y.o. female presenting on 06/07/2013 presented from home with complaints of shortness of breath chest pain and generalized swelling.  She has extensive history of scleroderma and severe secondary pulmonary hypertension, which has been treated in the past with bosentan and Coumadin, and multiple other medications. Recently she was placed on macitentan which was stopped and in March as she was having persistent diarrhea which was thought to be side effect of this medication-  since then she's not on any medication related to her pulmonary hypertension.The patient has been developing swelling in her legs as well as in her belly over past month associated with orthopnea and PND.  She was admitted to the ICU and transferred to the hospitalist service on 3/29  Subjective: No complaints   Assessment/Plan: Principal Problem:   Acute right-sided CHF- anasarca--secondary PULMONARY HYPERTENSION - medical management per CHF team - she tells me she was going to start [place] as outpt as ordered by Dr byrum   Active Problems:   DM type 2 with diabetic peripheral neuropathy - sugars stable    Gastroparesis - not on reglan due to recent diarrhea- not c/o vomiting thus far  AKI -at baseline now-  follow with diuresis  Generalized lymphadenopathy - d/w patient and sister in detail today - best approach would be to ask surgery to obtain inguinal lymph node for biopsy once stable    SCLERODERMA   Code Status: partial Family Communication: with sister who states pt does not tell her everything about medical issues Disposition Plan: follow in SDU- lives with granddaughter at home- will need HHPT  Consultants: CHF team  Procedures: none  Antibiotics: Antibiotics Given (last 72 hours)   None        DVT prophylaxis: Heparin  Objective: Filed Weights   06/08/13 1745 06/09/13 0500 06/10/13 0500  Weight: 79.1 kg (174 lb 6.1 oz) 75 kg (165 lb 5.5 oz) 68.7 kg (151 lb 7.3 oz)   Blood pressure 101/51, pulse 86, temperature 98.9 F (37.2 C), temperature source Oral, resp. rate 18, height _0  (1.626 m), weight 68.7 kg (151 lb 7.3 oz), SpO2 100.00%.  Intake/Output Summary (Last 24 hours) at 06/10/13 1928 Last data filed at 06/10/13 1700  Gross per 24 hour  Intake  805.2 ml  Output   6050 ml  Net -5244.8 ml     Exam: General: No acute respiratory distress Lungs: mild crackles at bases Cardiovascular: Regular rate and rhythm - + 2/6 murmur Abdomen: Nontender, nondistended, soft, bowel sounds positive, no rebound, no ascites, no appreciable mass Extremities: No significant cyanosis, clubbing, or edema bilateral lower extremities  Data Reviewed: Basic Metabolic Panel:  Recent Labs Lab 06/07/13 1845 06/07/13 2347 06/08/13 0307 06/08/13 0455 06/08/13 1050 06/08/13 1600 06/09/13 0825 06/10/13 0320  NA 144  --   --  141  --  140 139 134*  K 5.4*  --   --  5.6*  --  5.0 4.5 4.1  CL 111  --   --  106  --  104 101 94*  CO2 17*  --   --  16*  --  _1 GLUCOSE 89  --   --  76  --  95 106* 103*  BUN 29*  --   --  29*  --  29* 29*  32*  CREATININE 2.00*  --   --  1.89*  --  1.85* 1.74* 1.89*  CALCIUM 6.9*  --   --  7.5*  --  8.0* 8.4 9.0  MG  --  0.9* 0.6*  --  1.6  --  1.3* 1.3*  PHOS  --   --   --   --   --   --  4.0 4.0   Liver Function Tests:  Recent Labs Lab 06/05/13 1238 06/07/13 1845 06/08/13 0455  AST 35 29 44*  ALT _0 ALKPHOS 142* 162* 159*  BILITOT 0.8 0.6 0.8  PROT 6.9 7.5 7.3  ALBUMIN 3.1* 3.1* 3.0*    Recent Labs Lab 06/07/13 1845  LIPASE 15   No results found for this basename: AMMONIA,  in the last 168 hours CBC:  Recent Labs Lab 06/07/13 1845 06/08/13 0307 06/08/13 0455 06/10/13 0320  WBC 7.9 8.0  --  11.5*  NEUTROABS   --  3.2  --   --   HGB 9.3* 9.7*  --  11.1*  HCT 28.7* 28.7*  --  32.2*  MCV 81.8 80.2  --  79.7  PLT 184 170 178 231   Cardiac Enzymes:  Recent Labs Lab 06/07/13 1845 06/07/13 2347 06/08/13 0454 06/08/13 1050  TROPONINI <0.30 <0.30 <0.30 <0.30   BNP (last 3 results)  Recent Labs  06/07/13 1845 06/09/13 0825  PROBNP 3360.0* 2707.0*   CBG:  Recent Labs Lab 06/09/13 1647 06/09/13 2132 06/10/13 0730 06/10/13 1112 06/10/13 1648  GLUCAP 123* 99 104* 110* 101*    Recent Results (from the past 240 hour(s))  URINE CULTURE     Status: None   Collection Time    06/07/13 11:59 PM      Result Value Ref Range Status   Specimen Description URINE, CATHETERIZED   Final   Special Requests NONE   Final   Culture  Setup Time     Final   Value: 06/08/2013 04:29     Performed at [facility]     Final   Value: NO GROWTH     Performed at [facility]   Culture     Final   Value: NO GROWTH     Performed at [facility]   Report Status 06/09/2013 FINAL   Final  MRSA PCR SCREENING     Status: None   Collection Time    06/08/13  1:11 AM      Result Value Ref Range Status   MRSA by PCR NEGATIVE  NEGATIVE Final   Comment:            The GeneXpert MRSA Assay (FDA     approved for NASAL specimens     only), is one component of a     comprehensive MRSA colonization     surveillance program. It is not     intended to diagnose MRSA     infection nor to guide or     monitor treatment for     MRSA infections.     Studies:  Recent x-ray studies have been reviewed in detail by the Attending Physician  Scheduled Meds:  Scheduled Meds: . aspirin EC  81 mg Oral Daily  . gabapentin  100 mg Oral BID  . heparin  5,000 Units Subcutaneous 3 times per day  . insulin aspart  0-5 Units Subcutaneous QHS  . insulin aspart  0-9 Units Subcutaneous TID WC  .  pantoprazole  40 mg Oral BID  . pregabalin  150 mg Oral TID  . sodium chloride  3 mL  Intravenous Q12H  . [START ON 06/11/2013] Tadalafil (PAH)  40 mg Oral Daily   Continuous Infusions: . furosemide (LASIX) infusion 10 mg/hr (06/09/13 2234)  . milrinone 0.25 mcg/kg/min (06/10/13 1314)    Time spent on care of this patient: 93 min   [place], MD  [facility] Office  [phone] Pager - Text Page per Shea Evans as per below:  On-Call/Text Page:      [email or web address]  If 7PM-7AM, please contact night-coverage [email or web address] 06/10/2013, 7:28 PM   LOS: 3 days

## 2013-06-10 NOTE — Progress Notes (Signed)
Prior [facility] consult note from Dr. Haroldine Laws  HPI:    [name] is a 63 y/o woman, never smoker, hx scleroderma (formerly on chronic pred 50m), HTN, DM, sickle cell trait. She has been followed by LRafael Biharifor severe secondary PAH by Dr SAlva Garnetand then by Dr BLamonte Sakaisince 11/2005. She has been on several PAH targeted therapies and coumadin over last 9 years, but has not been reliably on therapy of any kind since 09/2012 due primarily to compliance and ability to have labs performed. She has had best response to Tyvaso (+ bosentan) with an improvement in 6 minute walk and in PASP from 102 mmHg (7/09) to 42 mmHg (9/10). Since stopping Tyvaso her PASP has risen to 80's by TTE 6/13 and 11/14. Surprisingly, she has never required supplemental O2. Most recent efforts have been to start macitentan in 11/14. She developed nausea and diarrhea and macitentan was stopped early 3/15.   Admitted 3/26 with decompensated R heart failure.Echo reviewed. LVEF ~50% Severe RV dysfunction with evidence of cor pulmonale RVSP 990mG. Small posterior pericardial effusion  Started on IV lasix and milrinone on 3/27. Diuresed 7L overnight. Weight down almost 10 pounds. More alert. Breathing better. Made limited code by CCM.   3/28 diuresed another 8 liters. Weight 152 from 182  Lives with 164/o granddaughter. No family help available.     Objective:    Vital Signs:   Temp:  [97.6 F (36.4 C)-99.2 F (37.3 C)] 98.8 F (37.1 C) (03/29 1110) Pulse Rate:  [68-117] 81 (03/29 1110) Resp:  [12-21] 15 (03/29 1110) BP: (86-110)/(39-60) 86/42 mmHg (03/29 1110) SpO2:  [91 %-99 %] 99 % (03/29 1110) Weight:  [151 lb 7.3 oz (68.7 kg)] 151 lb 7.3 oz (68.7 kg) (03/29 0500) Last BM Date: 06/10/13  Weight change: Filed Weights   06/08/13 1745 06/09/13 0500 06/10/13 0500  Weight: 174 lb 6.1 oz (79.1 kg) 165 lb 5.5 oz (75 kg) 151 lb 7.3 oz (68.7 kg)    Intake/Output:   Intake/Output Summary (Last 24 hours) at 06/10/13  1323 Last data filed at 06/10/13 1314  Gross per 24 hour  Intake 1358.8 ml  Output   7750 ml  Net -6391.2 ml     Physical Exam: General:  Alert No resp difficulty HEENT: normal Neck: supple. JVP to ear  Carotids 2+ bilat; no bruits. No lymphadenopathy or thryomegaly appreciated. Cor: +RV lift Regular rate & rhythm. 3/6 TR. Loud P2 Lungs: clear Abdomen: soft, nontender, + distended. Liver edge down ~4cm. No bruits or masses. Good bowel sounds. Extremities: no cyanosis, clubbing, rash, 2+  edema Neuro: alert & orientedx3, cranial nerves grossly intact. moves all 4 extremities w/o difficulty. Affect pleasant  Telemetry: SR 60-70  Labs: Basic Metabolic Panel:  Recent Labs Lab 06/07/13 1845 06/07/13 2347 06/08/13 0307 06/08/13 0455 06/08/13 1050 06/08/13 1600 06/09/13 0825 06/10/13 0320  NA 144  --   --  141  --  140 139 134*  K 5.4*  --   --  5.6*  --  5.0 4.5 4.1  CL 111  --   --  106  --  104 101 94*  CO2 17*  --   --  16*  --  _0 GLUCOSE 89  --   --  76  --  95 106* 103*  BUN 29*  --   --  29*  --  29* 29* 32*  CREATININE 2.00*  --   --  1.89*  --  1.85* 1.74* 1.89*  CALCIUM 6.9*  --   --  7.5*  --  8.0* 8.4 9.0  MG  --  0.9* 0.6*  --  1.6  --  1.3* 1.3*  PHOS  --   --   --   --   --   --  4.0 4.0    Liver Function Tests:  Recent Labs Lab 06/05/13 1238 06/07/13 1845 06/08/13 0455  AST 35 29 44*  ALT _0 ALKPHOS 142* 162* 159*  BILITOT 0.8 0.6 0.8  PROT 6.9 7.5 7.3  ALBUMIN 3.1* 3.1* 3.0*    Recent Labs Lab 06/07/13 1845  LIPASE 15   No results found for this basename: AMMONIA,  in the last 168 hours  CBC:  Recent Labs Lab 06/07/13 1845 06/08/13 0307 06/08/13 0455 06/10/13 0320  WBC 7.9 8.0  --  11.5*  NEUTROABS  --  3.2  --   --   HGB 9.3* 9.7*  --  11.1*  HCT 28.7* 28.7*  --  32.2*  MCV 81.8 80.2  --  79.7  PLT 184 170 178 231    Cardiac Enzymes:  Recent Labs Lab 06/07/13 1845 06/07/13 2347 06/08/13 0454  06/08/13 1050  TROPONINI <0.30 <0.30 <0.30 <0.30    BNP: BNP (last 3 results)  Recent Labs  06/07/13 1845 06/09/13 0825  PROBNP 3360.0* 2707.0*    CBG:  Recent Labs Lab 06/09/13 1231 06/09/13 1647 06/09/13 2132 06/10/13 0730 06/10/13 1112  GLUCAP 102* 123* 99 104* 110*    Coagulation Studies:  Recent Labs  06/07/13 2347 06/08/13 0455  LABPROT 20.6* 19.3*  INR 1.83* 1.68*    Other results: EKG: SR 73. R-axis. RVH  Imaging: No results found.   Medications:     Current Medications: . aspirin EC  81 mg Oral Daily  . gabapentin  100 mg Oral BID  . heparin  5,000 Units Subcutaneous 3 times per day  . insulin aspart  0-5 Units Subcutaneous QHS  . insulin aspart  0-9 Units Subcutaneous TID WC  . pantoprazole  40 mg Oral BID  . pregabalin  150 mg Oral TID  . sodium chloride  3 mL Intravenous Q12H  . Tadalafil (PAH)  20 mg Oral Daily    Infusions: . furosemide (LASIX) infusion 10 mg/hr (06/09/13 2234)  . milrinone 0.25 mcg/kg/min (06/10/13 1314)     Assessment:   1. PAH with cor pulmonale 2. Scleroderma 3. DM 4. Small pericardial effusion 5. Noncompliance 6. CKD, stage III - baseline Cr 1.6 7. DNR/DNI  Plan/Discussion:     She presents with decompensated/end-stage RHF with marked volume overload  in setting of severe PAH and medication noncompliance. Echo reviewed personally and PA pressures continue to climb. She previously had very good response to Tyvaso but her course has been complicated by severe noncompliance and refusal to take some of her meds and comply with LFT monitoring. She has very limited insight into her situation and combined with her lack of compliance and home support, she is not a candidate for IV therapies.   She has responded well to milrinone and IV lasix gtt. Will continue for at least another day. She is not candidate for home milrinone or Flolan. She refuses inhaled agents or Tyvaso. Cannot use bosentan as she won't f/u  with LFT checks. Will switch revatio to Adcirca 20 (once a day) - titrate to 40 daily (done) - and would consider adding Letaris after d/c as no LFT monitoring required.  Hospice discussed with her and she refuses at this point.   The advanced HF team will continue to follow and assist with management of her PAH/corpulmonale.  SKAINS, MARK,MD 1:23 PM  Length of Stay: 3

## 2013-06-10 NOTE — Evaluation (Signed)
Physical Therapy Evaluation Patient Details Name: [name] MRN: [id number] DOB: 03/03/1951 Today's Date: 06/10/2013   History of Present Illness  63 y/o female admitted with decopmpensated R heart failure and dyspnea.  Clinical Impression  Pt adm with the above dx. Pt's functional mobility is limited to fatigue, weakness, and dizziness due to low BPs. Pt lacks family availability for home assistance.Currently patient would require 24/7 supervision and use of RW for safe d/c home. 24/7 supervision is not available for patient, therefore pt would need to achieve safe mod I level of function for safe d/c home. If pt can't achieve mod I level of function during hospital course, pt may need ST-SNF placement to achieve maximal level of function prior to d/c home.  Pt to benefit from skilled acute PT to address limitations listed below.     Follow Up Recommendations Home health PT;Supervision/Assistance - 24 hour (pending pt progression, SNF may be appropriate )    Equipment Recommendations  Rolling walker with 5" wheels    Recommendations for Other Services       Precautions / Restrictions Precautions Precautions: Fall Restrictions Weight Bearing Restrictions: No      Mobility  Bed Mobility Overal bed mobility: Needs Assistance Bed Mobility: Supine to Sit     Supine to sit: Min assist     General bed mobility comments: min A advancing hips so that feet flat on ground. verbal and tactile cues for safety and sequencing. pt reports some dizziness sitting upright   Transfers Overall transfer level: Needs assistance Equipment used: Rolling walker (2 wheeled) Transfers: Sit to/from Stand Sit to Stand: Min assist         General transfer comment: pt req inc time to come to complete standing position. verbal and tactuile cues to A with hip extension and achieving upright standing position. pt complains of feeling a l"little dizzy" upon standing  Ambulation/Gait Ambulation/Gait  assistance: Min assist Ambulation Distance (Feet): 125 Feet Assistive device: Rolling walker (2 wheeled) Gait Pattern/deviations: Step-through pattern;Decreased stride length;Drifts right/left;Narrow base of support Gait velocity: slow; guarded for falls   General Gait Details: pt drifts towards the L req min A and verbal cues to correct and maintain stright direction. pt reports LE weakness and uneasines amb. pt req one standing rest break.    Stairs            Wheelchair Mobility    Modified Rankin (Stroke Patients Only)       Balance Overall balance assessment: Needs assistance Sitting-balance support: Feet supported;Bilateral upper extremity supported Sitting balance-Leahy Scale: Fair Sitting balance - Comments: pt able to sit EOB for 5 min prior to amb   Standing balance support: Bilateral upper extremity supported Standing balance-Leahy Scale: Poor Standing balance comment: pt req Bilat UE support to stabilize standing balance. pt slow to stand and req verbal and tactile cues to achieve upright position. pt reports LE weakness and dizziness upon standing.                       Pertinent Vitals/Pain BP seated: 78/45 BP standing: 81/49 RN notified    Home Living Family/patient expects to be discharged to:: Private residence Living Arrangements: Other relatives (granddaughter. not available all the time) Available Help at Discharge: Other (Comment);Family Type of Home: House Home Access: Stairs to enter Entrance Stairs-Rails: None Entrance Stairs-Number of Steps: 2 Home Layout: One level Home Equipment: None Additional Comments: family support is limited. possibly able to provide 24 hr  supervision for 2 days    Prior Function Level of Independence: Independent               Hand Dominance        Extremity/Trunk Assessment   Upper Extremity Assessment: Generalized weakness           Lower Extremity Assessment: Generalized weakness          Communication   Communication: No difficulties  Cognition Arousal/Alertness: Awake/alert Behavior During Therapy: Flat affect Overall Cognitive Status: Within Functional Limits for tasks assessed                      General Comments General comments (skin integrity, edema, etc.): pt's low BP similar to previous BPs during LOS.    Exercises        Assessment/Plan    PT Assessment Patient needs continued PT services  PT Diagnosis Difficulty walking;Generalized weakness   PT Problem List Decreased strength;Decreased range of motion;Decreased activity tolerance;Decreased balance;Decreased mobility;Decreased coordination;Decreased knowledge of use of DME;Decreased safety awareness  PT Treatment Interventions DME instruction;Gait training;Stair training;Functional mobility training;Therapeutic activities;Therapeutic exercise;Balance training;Patient/family education   PT Goals (Current goals can be found in the Care Plan section) Acute Rehab PT Goals Patient Stated Goal: "i just want to go home" PT Goal Formulation: With patient Time For Goal Achievement: 06/17/13 Potential to Achieve Goals: Good    Frequency Min 3X/week   Barriers to discharge Decreased caregiver support pending pt progression pt may benefit from SNF to maximize independence before returning home alone with lack of familiy support    End of Session Equipment Utilized During Treatment: Gait belt Activity Tolerance: Patient limited by fatigue Patient left: in chair;with call bell/phone within reach;with family/visitor present         Time: 0768-0881 PT Time Calculation (min): 34 min   Charges:         PT G Codes:          [name],[name] 06/10/2013, 4:17 PM  Agree with above assessment.  Kittie Plater, PT, DPT Pager #: [phone] Office #: [phone]

## 2013-06-11 LAB — MAGNESIUM: Magnesium: 1.7 mg/dL (ref 1.5–2.5)

## 2013-06-11 LAB — GLUCOSE, CAPILLARY
GLUCOSE-CAPILLARY: 112 mg/dL — AB (ref 70–99)
Glucose-Capillary: 95 mg/dL (ref 70–99)
Glucose-Capillary: 97 mg/dL (ref 70–99)
Glucose-Capillary: 101 mg/dL — ABNORMAL HIGH (ref 70–99)

## 2013-06-11 LAB — BASIC METABOLIC PANEL
BUN: 44 mg/dL — AB (ref 6–23)
CO2: 22 mEq/L (ref 19–32)
CREATININE: 2.12 mg/dL — AB (ref 0.50–1.10)
Calcium: 8.9 mg/dL (ref 8.4–10.5)
Chloride: 93 mEq/L — ABNORMAL LOW (ref 96–112)
GFR calc non Af Amer: 24 mL/min — ABNORMAL LOW (ref >90)
GFR, EST AFRICAN AMERICAN: 28 mL/min — AB (ref >90)
Glucose, Bld: 112 mg/dL — ABNORMAL HIGH (ref 70–99)
POTASSIUM: 4.1 meq/L (ref 3.7–5.3)
Sodium: 136 mEq/L — ABNORMAL LOW (ref 137–147)

## 2013-06-11 LAB — PHOSPHORUS: PHOSPHORUS: 5 mg/dL — AB (ref 2.3–4.6)

## 2013-06-11 MED ORDER — ASPIRIN 81 MG PO CHEW
CHEWABLE_TABLET | ORAL | Status: AC
  Administered 2013-06-11: 81 mg
  Filled 2013-06-11: qty 1

## 2013-06-11 NOTE — Progress Notes (Signed)
Progress Note [place] TEAM 1 - Stepdown/ICU TEAM   [name] WVP:[id number] DOB: 05-07-1950 DOA: 06/07/2013 PCP: Gilles Chiquito, MD  Brief narrative: 63 y.o. female presenting 06/07/2013 from home with complaints of shortness of breath chest pain and generalized swelling. She has extensive history of scleroderma and severe secondary pulmonary hypertension, which had been treated in the past with bosentan and Coumadin, and multiple other medications. Recently she was placed on macitentan which was stopped as she was having persistent diarrhea which was thought to be side effect of this medication-  since then she's not on any medication related to her pulmonary hypertension.The patient has been developing swelling in her legs as well as in her belly over the past month associated with orthopnea and PND. She was admitted to the ICU and transferred to the hospitalist service on 3/29.  SIGNIFICANT EVENTS / STUDIES:  PFT 03/30/04 >> mixed restriction and obstruction, FEV1 1.81L (73% pred), TLC 76% pred, decreased DLCO that corrects for Va  TTE 10/03/07 >> normal LV size anad fxn, severe septal bowing, mild to mod LV wall thickening, dilated RA and RV, estimated PASP 102 mmHg  TTE 01/24/13 >> normal LV size with concentric hypertrophy, severe RV dilation w moderately decreased fxn, severe RA dilation, estimated PASP 87 mmHg.  CT abd/pelvis 3/26 >> mild ascites, anasarca, small B pleural effusions, moderate pericardial effusion, multiple B enlarged inguinal, iliac, retroperitoneal nodes TTE - LVEF ~50% Severe RV dysfunction with evidence of cor pulmonale RVSP 51mHG. Small posterior pericardial effusion  Subjective: No complaints today.  Says she feels better and wants to go home.    Assessment/Plan:  Acute on chronic Right heart failure exacerbation with anasarca secondary to PULMONARY HYPERTENSION - medical management per CHF team  Severe secondary PAH Needs to be back on targeted therapy per  Pulm - has tolerated bosentan but stopped due to poor compliance with LFT checks - The primary issue here is compliance with medications per Pulm  - optimally would restart coumadin long term  DM type 2 with diabetic peripheral neuropathy - CBG stable  Gastroparesis - not on reglan due to recent diarrhea - not c/o vomiting thus far  AKI -crt climbing - follow - diuresis per Cardiology   Generalized lymphadenopathy - d/w patient and sister in detail 3/29 (Dr. 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 rate 20, height _0  (1.626 m), weight 68.7 kg (151 lb 7.3 oz), SpO2 96.00%.  Intake/Output Summary (Last 24 hours) at 06/11/13 1350 Last data filed at 06/11/13 1300  Gross per 24 hour  Intake 821.47 ml  Output   2500 ml  Net -1678.53 ml   Exam: General: No acute respiratory distress at rest in chair  Lungs: mild crackles at bases - no wheeze  Cardiovascular: Regular  rate and rhythm w/o gallup or rub  Abdomen: Nontender, nondistended, soft, bowel sounds positive, no rebound, no ascites, no appreciable mass Extremities: No significant cyanosis, clubbing, or edema bilateral lower extremities  Data Reviewed: Basic Metabolic Panel:  Recent Labs Lab 06/08/13 0307 06/08/13 0455 06/08/13 1050  06/08/13 1600 06/09/13 0825 06/10/13 0320 06/11/13 0337  NA  --  141  --  140 139 134* 136*  K  --  5.6*  --  5.0 4.5 4.1 4.1  CL  --  106  --  104 101 94* 93*  CO2  --  16*  --  _0 GLUCOSE  --  76  --  95 106* 103* 112*  BUN  --  29*  --  29* 29* 32* 44*  CREATININE  --  1.89*  --  1.85* 1.74* 1.89* 2.12*  CALCIUM  --  7.5*  --  8.0* 8.4 9.0 8.9  MG 0.6*  --  1.6  --  1.3* 1.3* 1.7  PHOS  --   --   --   --  4.0 4.0 5.0*   Liver Function Tests:  Recent Labs Lab 06/05/13 1238 06/07/13 1845 06/08/13 0455  AST 35 29 44*  ALT _1 ALKPHOS 142* 162* 159*  BILITOT 0.8 0.6 0.8  PROT 6.9 7.5 7.3  ALBUMIN 3.1* 3.1* 3.0*   CBC:  Recent Labs Lab 06/07/13 1845 06/08/13 0307 06/08/13 0455 06/10/13 0320  WBC 7.9 8.0  --  11.5*  NEUTROABS  --  3.2  --   --   HGB 9.3* 9.7*  --  11.1*  HCT 28.7* 28.7*  --  32.2*  MCV 81.8 80.2  --  79.7  PLT 184 170 178 231   Cardiac Enzymes:  Recent Labs Lab 06/07/13 1845 06/07/13 2347 06/08/13 0454 06/08/13 1050  TROPONINI <0.30 <0.30 <0.30 <0.30   BNP (last 3 results)  Recent Labs  06/07/13 1845 06/09/13 0825  PROBNP 3360.0* 2707.0*   CBG:  Recent Labs Lab 06/10/13 1112 06/10/13 1648 06/10/13 2109 06/11/13 0825 06/11/13 1220  GLUCAP 110* 101* 132* 95 97    Recent Results (from the past 240 hour(s))  URINE CULTURE     Status: None   Collection Time    06/07/13 11:59 PM      Result Value Ref Range Status   Specimen Description URINE, CATHETERIZED   Final   Special Requests NONE   Final   Culture  Setup Time     Final   Value: 06/08/2013 04:29     Performed at [facility] Count     Final   Value: NO GROWTH     Performed at [facility]   Culture     Final   Value: NO GROWTH     Performed at [facility]   Report Status 06/09/2013 FINAL   Final  MRSA PCR SCREENING     Status: None   Collection Time    06/08/13  1:11 AM      Result Value Ref Range Status    MRSA by PCR NEGATIVE  NEGATIVE Final   Comment:            The GeneXpert MRSA Assay (FDA     approved for NASAL specimens     only), is one component of a     comprehensive MRSA colonization     surveillance program. It is not     intended to  diagnose MRSA     infection nor to guide or     monitor treatment for     MRSA infections.     Studies:  Recent x-ray studies have been reviewed in detail by the Attending Physician  Scheduled Meds:  Scheduled Meds: . aspirin EC  81 mg Oral Daily  . gabapentin  100 mg Oral BID  . heparin  5,000 Units Subcutaneous 3 times per day  . insulin aspart  0-5 Units Subcutaneous QHS  . insulin aspart  0-9 Units Subcutaneous TID WC  . pantoprazole  40 mg Oral BID  . pregabalin  150 mg Oral TID  . sodium chloride  3 mL Intravenous Q12H  . Tadalafil (PAH)  40 mg Oral Daily   Continuous Infusions: . milrinone 0.25 mcg/kg/min (06/11/13 0600)    Time spent on care of this patient: 25 mins   [facility] T, MD  [facility] Office  [phone] Pager - Text Page per Shea Evans as per below:  On-Call/Text Page:      [email or web address]  If 7PM-7AM, please contact night-coverage [email or web address] 06/11/2013, 1:50 PM   LOS: 4 days

## 2013-06-11 NOTE — Telephone Encounter (Signed)
Per RB pt in hospital

## 2013-06-11 NOTE — Progress Notes (Signed)
Prior [facility] consult note from Dr. Haroldine Laws  HPI:    Ms. Seeber is a 63 y/o woman, never smoker, hx scleroderma (formerly on chronic pred 17m), HTN, DM, sickle cell trait. She has been followed by LRafael Biharifor severe secondary PAH by Dr SAlva Garnetand then by Dr BLamonte Sakaisince 11/2005. She has been on several PAH targeted therapies and coumadin over last 9 years, but has not been reliably on therapy of any kind since 09/2012 due primarily to compliance and ability to have labs performed. She has had best response to Tyvaso (+ bosentan) with an improvement in 6 minute walk and in PASP from 102 mmHg (7/09) to 42 mmHg (9/10). Since stopping Tyvaso her PASP has risen to 80's by TTE 6/13 and 11/14. Surprisingly, she has never required supplemental O2. Most recent efforts have been to start macitentan in 11/14. She developed nausea and diarrhea and macitentan was stopped early 3/15.  Admitted 3/26 with decompensated R heart failure.Echo reviewed. LVEF ~50% Severe RV dysfunction with evidence of cor pulmonale RVSP 943mG. Small posterior pericardial effusion  Started on IV lasix and milrinone on 3/27. Diuresed 3L overnight, no weight change. Denies SOB, orthopnea or CP.  Lives with 1671/o granddaughter. No family help available.   Objective:    Vital Signs:   Temp:  [97.4 F (36.3 C)-99 F (37.2 C)] 97.4 F (36.3 C) (03/30 0400) Pulse Rate:  [75-90] 75 (03/30 0400) Resp:  [15-20] 20 (03/29 2000) BP: (70-101)/(30-52) 95/52 mmHg (03/30 0400) SpO2:  [93 %-100 %] 98 % (03/30 0400) Weight:  [151 lb 7.3 oz (68.7 kg)] 151 lb 7.3 oz (68.7 kg) (03/30 0400) Last BM Date: 06/10/13  Weight change: Filed Weights   06/09/13 0500 06/10/13 0500 06/11/13 0400  Weight: 165 lb 5.5 oz (75 kg) 151 lb 7.3 oz (68.7 kg) 151 lb 7.3 oz (68.7 kg)    Intake/Output:   Intake/Output Summary (Last 24 hours) at 06/11/13 0717 Last data filed at 06/11/13 0600  Gross per 24 hour  Intake  787.6 ml  Output   3850 ml  Net  -3062.4 ml     Physical Exam: General:  Alert No resp difficulty HEENT: normal Neck: supple. JVP 8-9 cm.  Carotids 2+ bilat; no bruits. No lymphadenopathy or thryomegaly appreciated. Cor: +RV lift with right-sided S3.  Regular rate & rhythm. 3/6 TR. Loud P2 Lungs: clear Abdomen: soft, nontender, + distended. Liver edge down ~4cm. No bruits or masses. Good bowel sounds. Extremities: no cyanosis, clubbing, rash, no edema Neuro: alert & orientedx3, cranial nerves grossly intact. moves all 4 extremities w/o difficulty. Affect pleasant  Telemetry: SR 60-70  Labs: Basic Metabolic Panel:  Recent Labs Lab 06/08/13 0307 06/08/13 0455 06/08/13 1050 06/08/13 1600 06/09/13 0825 06/10/13 0320 06/11/13 0337  NA  --  141  --  140 139 134* 136*  K  --  5.6*  --  5.0 4.5 4.1 4.1  CL  --  106  --  104 101 94* 93*  CO2  --  16*  --  _0 GLUCOSE  --  76  --  95 106* 103* 112*  BUN  --  29*  --  29* 29* 32* 44*  CREATININE  --  1.89*  --  1.85* 1.74* 1.89* 2.12*  CALCIUM  --  7.5*  --  8.0* 8.4 9.0 8.9  MG 0.6*  --  1.6  --  1.3* 1.3* 1.7  PHOS  --   --   --   --  4.0 4.0 5.0*    Liver Function Tests:  Recent Labs Lab 06/05/13 1238 06/07/13 1845 06/08/13 0455  AST 35 29 44*  ALT _0 ALKPHOS 142* 162* 159*  BILITOT 0.8 0.6 0.8  PROT 6.9 7.5 7.3  ALBUMIN 3.1* 3.1* 3.0*    Recent Labs Lab 06/07/13 1845  LIPASE 15   No results found for this basename: AMMONIA,  in the last 168 hours  CBC:  Recent Labs Lab 06/07/13 1845 06/08/13 0307 06/08/13 0455 06/10/13 0320  WBC 7.9 8.0  --  11.5*  NEUTROABS  --  3.2  --   --   HGB 9.3* 9.7*  --  11.1*  HCT 28.7* 28.7*  --  32.2*  MCV 81.8 80.2  --  79.7  PLT 184 170 178 231    Cardiac Enzymes:  Recent Labs Lab 06/07/13 1845 06/07/13 2347 06/08/13 0454 06/08/13 1050  TROPONINI <0.30 <0.30 <0.30 <0.30    BNP: BNP (last 3 results)  Recent Labs  06/07/13 1845 06/09/13 0825  PROBNP 3360.0* 2707.0*     CBG:  Recent Labs Lab 06/09/13 2132 06/10/13 0730 06/10/13 1112 06/10/13 1648 06/10/13 2109  GLUCAP 99 104* 110* 101* 132*    Coagulation Studies: No results found for this basename: LABPROT, INR,  in the last 72 hours  Other results: EKG: SR 73. R-axis. RVH  Imaging: No results found.   Medications:     Current Medications: . aspirin EC  81 mg Oral Daily  . gabapentin  100 mg Oral BID  . heparin  5,000 Units Subcutaneous 3 times per day  . insulin aspart  0-5 Units Subcutaneous QHS  . insulin aspart  0-9 Units Subcutaneous TID WC  . pantoprazole  40 mg Oral BID  . pregabalin  150 mg Oral TID  . sodium chloride  3 mL Intravenous Q12H  . Tadalafil (PAH)  40 mg Oral Daily    Infusions: . furosemide (LASIX) infusion 10 mg/hr (06/11/13 0600)  . milrinone 0.25 mcg/kg/min (06/11/13 0600)     Assessment:   1. PAH with cor pulmonale 2. Scleroderma 3. DM 4. Small pericardial effusion 5. Noncompliance 6. CKD, stage III - baseline Cr 1.6 7. DNR/DNI  Plan/Discussion:    She presents with decompensated/end-stage RHF with marked volume overload  in setting of severe PAH and medication noncompliance. Echo reviewed and PA pressures continue to climb. She previously had very good response to Tyvaso but her course has been complicated by severe noncompliance and refusal to take some of her meds and comply with LFT monitoring. She has very limited insight into her situation and combined with her lack of compliance and home support, she is not a candidate for IV therapies.   She has responded well to milrinone and IV lasix gtt. 24 hr I/O down another 3 liters, (pendng standing weight). Cr increasing now 2.12 will stop IV lasix and try to wean milrinone to 0.125 tomorrow. She is not candidate for home milrinone or Flolan. She refuses inhaled agents or Tyvaso. Cannot use bosentan as she won't f/u with LFT checks. She is now on Adcirca 40 mg daily and would consider adding  Letairis after d/c as no LFT monitoring required. Will work on paperwork for [employer], not sure if insurance will approve. They may want to trial sidenafil first, if that is the case then will need to switch meds. We would prefer Adcirca d/t compliance issues.   Hospice discussed with her and she refuses at this point.  The advanced HF team will continue to follow and assist with management of her PAH/corpulmonale.  Rande Brunt, NP-C 7:17 AM  Length of Stay: 4  Patient seen NP, agree with the above note.  Creatinine rising today, has diuresed well overall.   - Hold Lasix today, likely resume po diuretic tomorrow.  - Start milrinone wean tomorrow.  - Ambulate - Will continue to work on Engineer, site for home.   [name] 06/11/2013 8:16 AM

## 2013-06-11 NOTE — Telephone Encounter (Signed)
ATC x2, message states not available.Emlyn Bing, CMA

## 2013-06-11 NOTE — Care Management Note (Addendum)
  Page 2 of 2   06/13/2013     8:50:40 PM   CARE MANAGEMENT NOTE 06/13/2013  Patient:  [name], [name]   Account Number:  [id number]  Date Initiated:  06/08/2013  Documentation initiated by:  DAVIS,RHONDA  Subjective/Objective Assessment:   pt with known hx of heart failure, now with cough increased and ascities, ctscan confirmed pericardial effusion.     Action/Plan:   from home and lives with granddaughter who is 39years old.   Anticipated DC Date:     Anticipated DC Plan:  [place]  In-house referral  NA      DC Planning Services  CM consult      PAC Choice  NA   Choice offered to / List presented to:  NA   DME arranged  NA      DME agency  NA     [place] arranged  HH-1 RN  [place] [facility]      [place].   Status of service:  Completed, signed off Medicare Important Message given?  NA - LOS <3 / Initial given by admissions (If response is "NO", the following Medicare IM given date fields will be blank) Date Medicare IM given:   Date Additional Medicare IM given:    Discharge Disposition:  [place]  Per UR Regulation:  Reviewed for med. necessity/level of care/duration of stay  If discussed at [place] of Stay Meetings, dates discussed:   06/12/2013    Comments:  06/13/2013 CHF/End Stage Hx/o HTN, DM, [place] trait, Scleroderma. Social:  from home and lives with granddaughter who is 26years old.  Hx/o non-adherent with medications, labs, appts. Patient refuses SNF and Hospice Disposition Plan:  Home with HHS: RN, PT [facility] aware) Disease Mgmt: CHF/end stage. Medication Reconciliation and MGMT Assess compliance with medications and keeping MD appts. Mykle Pascua RN, BSN, MSHL, CCM  06/11/13  0957 debbie dowell rn,bsn spoke w pt and gave her hhc agency list. she chose [facility]. she had uses them before and would like them again. ref to [facility] w [facility] for hhrn.  [id number]/RMBOBO Rosana Hoes,  RN, BSN, [place] [phone] Chart Reviewed for discharge and hospital needs. Discharge needs at time of review:  None present will follow for needs.  May need home 02 and home heart failure program. Review of patient progress due on [id number].

## 2013-06-11 NOTE — Progress Notes (Signed)
CARDIAC REHAB PHASE I   PRE:  Rate/Rhythm: 79 SR    BP: sitting 91/43    SaO2: 96 RA  MODE:  Ambulation: 180 ft   POST:  Rate/Rhythm: 96 SR    BP: sitting 103/45     SaO2: 93 RA  Pt weak, wobbly. Tried walking to door without RW but very unsteady. Used RW for hallway. Pt pushed RW faster on left side than right. Sts she thinks she is typically weaker on right side. Would benefit from gait belt next walk. Tired after walk, return to recliner. Will f/u. [phone]   Josephina Shih [place] CES, ACSM 06/11/2013 3:16 PM

## 2013-06-11 NOTE — Progress Notes (Signed)
Clinical Social Work Department BRIEF PSYCHOSOCIAL ASSESSMENT 06/11/2013  Patient:  [name], [name]     Account Number:  [id number]     Admit date:  06/07/2013  Clinical Social Worker:  Freeman Caldron  Date/Time:  06/11/2013 11:42 AM  Referred by:  Physician  Date Referred:  06/11/2013 Referred for  SNF Placement   Other Referral:   Interview type:  Patient Other interview type:   Family member at bedside.    PSYCHOSOCIAL DATA Living Status:  FAMILY Admitted from facility:   Level of care:   Primary support name:  Jorene Guest [phone]) Primary support relationship to patient:  SIBLING Degree of support available:   Good--pt lives with granddaughter.    CURRENT CONCERNS Current Concerns  Post-Acute Placement   Other Concerns:    SOCIAL WORK ASSESSMENT / PLAN Introduced self and explained my role in care plan. Pt states she does not want to go to SNF, and she wants to go home. I asked if she would be interested in speaking to [facility] about home health, and after explaining home health pt states this would be fine. I informed RNCM.   Assessment/plan status:  No Further Intervention Required Other assessment/ plan:   Information/referral to community resources:   Home health    [name]/FAMILY'S RESPONSE TO PLAN OF CARE: Good--pt spoke with me briefly, provided short answers to questions and made it clear she is not interested in SNF. Female family member at bedside, playing a game on her iPad and did not introduce self/engage in conversation. Informed RNCM pt interested in learning more about home health; RNCM will follow up.       Ky Barban, MSW, [facility] Clinical Social Worker [phone]

## 2013-06-11 NOTE — Clinical Documentation Improvement (Signed)
Can the patient's Acute Right Heart Failure be documented as a specific ACUITY and TYPE of Heart Failure  Acuity:  Acute;  Chronic;  Acute on Chronic  Type:  Systolic;  Diastolic;  Combined   Thank You,  Erling Conte ,RN BSN CCDS Certified Clinical Documentation Specialist:  [phone] [place] Information Management

## 2013-06-12 LAB — BASIC METABOLIC PANEL
BUN: 51 mg/dL — ABNORMAL HIGH (ref 6–23)
CHLORIDE: 93 meq/L — AB (ref 96–112)
CO2: 22 meq/L (ref 19–32)
Calcium: 9.1 mg/dL (ref 8.4–10.5)
Creatinine, Ser: 1.89 mg/dL — ABNORMAL HIGH (ref 0.50–1.10)
GFR calc Af Amer: 32 mL/min — ABNORMAL LOW (ref >90)
GFR calc non Af Amer: 28 mL/min — ABNORMAL LOW (ref >90)
Glucose, Bld: 114 mg/dL — ABNORMAL HIGH (ref 70–99)
Potassium: 4.1 mEq/L (ref 3.7–5.3)
SODIUM: 134 meq/L — AB (ref 137–147)

## 2013-06-12 LAB — GLUCOSE, CAPILLARY
GLUCOSE-CAPILLARY: 102 mg/dL — AB (ref 70–99)
GLUCOSE-CAPILLARY: 104 mg/dL — AB (ref 70–99)
Glucose-Capillary: 130 mg/dL — ABNORMAL HIGH (ref 70–99)
Glucose-Capillary: 105 mg/dL — ABNORMAL HIGH (ref 70–99)

## 2013-06-12 LAB — MAGNESIUM: MAGNESIUM: 1.4 mg/dL — AB (ref 1.5–2.5)

## 2013-06-12 LAB — PHOSPHORUS: PHOSPHORUS: 4.2 mg/dL (ref 2.3–4.6)

## 2013-06-12 MED ORDER — MILRINONE IN DEXTROSE 20 MG/100ML IV SOLN
0.1250 ug/kg/min | INTRAVENOUS | Status: DC
  Filled 2013-06-12: qty 100

## 2013-06-12 MED ORDER — FUROSEMIDE 40 MG PO TABS
40.0000 mg | ORAL_TABLET | Freq: Two times a day (BID) | ORAL | Status: DC
  Administered 2013-06-13 – 2013-06-14 (×3): 40 mg via ORAL
  Filled 2013-06-12 (×5): qty 1

## 2013-06-12 MED ORDER — FUROSEMIDE 40 MG PO TABS
40.0000 mg | ORAL_TABLET | Freq: Once | ORAL | Status: AC
  Administered 2013-06-12: 40 mg via ORAL
  Filled 2013-06-12: qty 1

## 2013-06-12 MED ORDER — MAGNESIUM SULFATE 40 MG/ML IJ SOLN
2.0000 g | Freq: Once | INTRAMUSCULAR | Status: AC
  Administered 2013-06-12: 2 g via INTRAVENOUS
  Filled 2013-06-12: qty 50

## 2013-06-12 NOTE — Progress Notes (Signed)
PT Cancellation Note  Patient Details Name: [name] MRN: [id number] DOB: [birth year]   Cancelled Treatment:    Reason Eval/Treat Not Completed: Other (comment) (pt currently walking with cardiac rehab. Will attempt in Pm if time allows)   Lanetta Inch [facility] 06/12/2013, 11:33 AM Elwyn Reach, [place]

## 2013-06-12 NOTE — Progress Notes (Signed)
Prior [facility] consult note from Dr. Haroldine Laws  HPI:    [name] is a 63 y/o woman, never smoker, hx scleroderma (formerly on chronic pred 68m), HTN, DM, sickle cell trait. She has been followed by LRafael Biharifor severe secondary PAH by Dr SAlva Garnetand then by Dr BLamonte Sakaisince 11/2005. She has been on several PAH targeted therapies and coumadin over last 9 years, but has not been reliably on therapy of any kind since 09/2012 due primarily to compliance and ability to have labs performed. She has had best response to Tyvaso (+ bosentan) with an improvement in 6 minute walk and in PASP from 102 mmHg (7/09) to 42 mmHg (9/10). Since stopping Tyvaso her PASP has risen to 80's by TTE 6/13 and 11/14. Surprisingly, she has never required supplemental O2. Most recent efforts have been to start macitentan in 11/14. She developed nausea and diarrhea and macitentan was stopped early 3/15.  Admitted 3/26 with decompensated R heart failure.Echo reviewed. LVEF ~50% Severe RV dysfunction with evidence of cor pulmonale RVSP 936mG. Small posterior pericardial effusion  Started on IV lasix and milrinone on 3/27. IV lasix stopped yesterday. Weight stable. Denies SOB, orthopnea or CP. Remains on milrinone 0.25. Ambulated in the hall yesterday 180 ft with unsteady gait.  Objective:    Vital Signs:   Temp:  [97.6 F (36.4 C)-98.9 F (37.2 C)] 97.6 F (36.4 C) (03/31 0400) Pulse Rate:  [80-86] 80 (03/31 0400) BP: (84-103)/(30-61) 84/30 mmHg (03/31 0400) SpO2:  [94 %-98 %] 94 % (03/31 0400) Weight:  [151 lb 3.8 oz (68.6 kg)] 151 lb 3.8 oz (68.6 kg) (03/31 0400) Last BM Date: 06/10/13  Weight change: Filed Weights   06/10/13 0500 06/11/13 0400 06/12/13 0400  Weight: 151 lb 7.3 oz (68.7 kg) 151 lb 7.3 oz (68.7 kg) 151 lb 3.8 oz (68.6 kg)    Intake/Output:   Intake/Output Summary (Last 24 hours) at 06/12/13 0659 Last data filed at 06/12/13 0500  Gross per 24 hour  Intake 1349.07 ml  Output   1150 ml  Net 199.07  ml     Physical Exam: General:  Alert No resp difficulty HEENT: normal Neck: supple. JVP 7 cm.  Carotids 2+ bilat; no bruits. No lymphadenopathy or thryomegaly appreciated. Cor: +RV lift with right-sided S3.  Regular rate & rhythm. 3/6 TR. Loud P2 Lungs: clear Abdomen: soft, nontender, non distended. Liver edge down ~4cm. No bruits or masses. Good bowel sounds. Extremities: no cyanosis, clubbing, rash, no edema Neuro: alert & orientedx3, cranial nerves grossly intact. moves all 4 extremities w/o difficulty. Affect pleasant  Telemetry: SR 60-70  Labs: Basic Metabolic Panel:  Recent Labs Lab 06/08/13 1050 06/08/13 1600 06/09/13 0825 06/10/13 0320 06/11/13 0337 06/12/13 0345  NA  --  140 139 134* 136* 134*  K  --  5.0 4.5 4.1 4.1 4.1  CL  --  104 101 94* 93* 93*  CO2  --  _0 GLUCOSE  --  95 106* 103* 112* 114*  BUN  --  29* 29* 32* 44* 51*  CREATININE  --  1.85* 1.74* 1.89* 2.12* 1.89*  CALCIUM  --  8.0* 8.4 9.0 8.9 9.1  MG 1.6  --  1.3* 1.3* 1.7 1.4*  PHOS  --   --  4.0 4.0 5.0* 4.2    Liver Function Tests:  Recent Labs Lab 06/05/13 1238 06/07/13 1845 06/08/13 0455  AST 35 29 44*  ALT _1 ALKPHOS 142* 162*  159*  BILITOT 0.8 0.6 0.8  PROT 6.9 7.5 7.3  ALBUMIN 3.1* 3.1* 3.0*    Recent Labs Lab 06/07/13 1845  LIPASE 15   No results found for this basename: AMMONIA,  in the last 168 hours  CBC:  Recent Labs Lab 06/07/13 1845 06/08/13 0307 06/08/13 0455 06/10/13 0320  WBC 7.9 8.0  --  11.5*  NEUTROABS  --  3.2  --   --   HGB 9.3* 9.7*  --  11.1*  HCT 28.7* 28.7*  --  32.2*  MCV 81.8 80.2  --  79.7  PLT 184 170 178 231    Cardiac Enzymes:  Recent Labs Lab 06/07/13 1845 06/07/13 2347 06/08/13 0454 06/08/13 1050  TROPONINI <0.30 <0.30 <0.30 <0.30    BNP: BNP (last 3 results)  Recent Labs  06/07/13 1845 06/09/13 0825  PROBNP 3360.0* 2707.0*    CBG:  Recent Labs Lab 06/10/13 2109 06/11/13 0825 06/11/13 1220  06/11/13 1721 06/11/13 2109  GLUCAP 132* 95 97 101* 112*    Coagulation Studies: No results found for this basename: LABPROT, INR,  in the last 72 hours   Imaging: No results found.   Medications:     Current Medications: . aspirin EC  81 mg Oral Daily  . gabapentin  100 mg Oral BID  . heparin  5,000 Units Subcutaneous 3 times per day  . insulin aspart  0-5 Units Subcutaneous QHS  . insulin aspart  0-9 Units Subcutaneous TID WC  . pantoprazole  40 mg Oral BID  . pregabalin  150 mg Oral TID  . sodium chloride  3 mL Intravenous Q12H  . Tadalafil (PAH)  40 mg Oral Daily    Infusions: . milrinone 0.25 mcg/kg/min (06/11/13 2105)     Assessment:   1. PAH with cor pulmonale 2. Scleroderma 3. DM 4. Small pericardial effusion 5. Noncompliance 6. CKD, stage III - baseline Cr 1.6 7. DNR/DNI  Plan/Discussion:    She presents with decompensated/end-stage RHF with marked volume overload  in setting of severe PAH and medication noncompliance. Echo reviewed and PA pressures continue to climb. She previously had very good response to Tyvaso but her course has been complicated by severe noncompliance and refusal to take some of her meds and comply with LFT monitoring. She has very limited insight into her situation and combined with her lack of compliance and home support, she is not a candidate for IV therapies.   She responded well to milrinone and IV lasix gtt. Net negative -21 liters. Lasix stopped yesterday, and Cr trending down. Will restart PO diuretics lasix 40 mg BID today. Give 2gm magnesium. Will wean milrinone today to 0.125 and then off tomorrow.  She is not candidate for home milrinone or Flolan. She refuses inhaled agents or Tyvaso. Cannot use bosentan as she won't f/u with LFT checks. She is now on Adcirca 40 mg daily and would consider adding Letairis after d/c as no LFT monitoring required.   Hospice discussed with her and she refuses at this point. She is not  interested in SNF and will be [facility] with [facility]. Continue to work with Cardiac rehab. Will transfer to telemetry today.   Rande Brunt, NP-C 6:59 AM  Length of Stay: 5  Patient seen with NP, agree with the above note.  No complaints this morning.  Walked with PT, unsteady.  Creatinine now trending down.  Will start Lasix 40 mg po bid this evening and decrease milrinone to 0.125.  She  refuses SNF/rehab and hospice.  She will have to go home with PT and RN.  May transfer to floor.  [name] 06/12/2013 7:38 AM

## 2013-06-12 NOTE — Progress Notes (Signed)
Physical Therapy Treatment Patient Details Name: [name] MRN: [id number] DOB: [birth year] Today's Date: July 09, 2013    History of Present Illness 63 y/o female admitted with decompensated R heart failure and dyspnea.    PT Comments    Pt with fatigue after gait and pt and sister report several falls this year. Pt states she thinks she has a RW but isn't sure and recommended RW use for home to decrease fall risk in addition to HHPT for balance training. Pt with inability to complete full Left knee extension against gravity without assist and provided HEP for strengthening LLE knee flexion 2+/5, hip flexion and abduction 4/5 . Will continue to follow to maximize strength and balance.   Follow Up Recommendations  Home health PT;Supervision - Intermittent     Equipment Recommendations  Rolling walker with 5" wheels    Recommendations for Other Services       Precautions / Restrictions Precautions Precautions: Fall    Mobility  Bed Mobility Overal bed mobility: Modified Independent                Transfers Overall transfer level: Modified independent                  Ambulation/Gait Ambulation/Gait assistance: Supervision Ambulation Distance (Feet): 400 Feet Assistive device: None Gait Pattern/deviations: Step-through pattern;Decreased stride length;Narrow base of support   Gait velocity interpretation: Below normal speed for age/gender     Stairs Stairs: Yes Stairs assistance: Modified independent (Device/Increase time) Stair Management: One rail Right;Alternating pattern;Forwards Number of Stairs: 5    Wheelchair Mobility    Modified Rankin (Stroke Patients Only)       Balance Overall balance assessment: Needs assistance   Sitting balance-Leahy Scale: Good       Standing balance-Leahy Scale: Good    42 on [place]                  Cognition Arousal/Alertness: Awake/alert Behavior During Therapy: Flat affect Overall Cognitive  Status: Within Functional Limits for tasks assessed                      Exercises General Exercises - Lower Extremity Long Arc Quad: AROM;Seated;Left;10 reps Hip ABduction/ADduction: AROM;Seated;Left;10 reps Hip Flexion/Marching: AROM;Seated;Left;10 reps    General Comments        Pertinent Vitals/Pain No pain VSS    Home Living                      Prior Function            PT Goals (current goals can now be found in the care plan section) Progress towards PT goals: Progressing toward goals    Frequency       PT Plan Discharge plan needs to be updated    End of Session Equipment Utilized During Treatment: Gait belt Activity Tolerance: Patient tolerated treatment well Patient left: in bed;with call bell/phone within reach;with family/visitor present     Time: 7564-3329 PT Time Calculation (min): 33 min  Charges:  $Gait Training: 8-22 mins $Physical Performance Test: 8-22 mins                    G Codes:      Melford Aase 07-09-13, 2:54 PM Elwyn Reach, [place]

## 2013-06-12 NOTE — Progress Notes (Signed)
CARDIAC REHAB PHASE I   PRE:  Rate/Rhythm: 88 SR   BP:  Supine: 92/55  Sitting:   Standing:    SaO2: 97%RA  MODE:  Ambulation: 250 ft   POST:  Rate/Rhythm: 94 SR  BP:  Supine:   Sitting: 90/43  Standing:    SaO2: 97%RA 1120-1152 Pt walked 250 ft on RA with gait belt use, rolling walker and asst x 1. Pt is not steady. Right leg buckled at door. Pt states she has walker at home but does not have anyone to stay with her. Pt is not safe to be up by herself. Tired easily and had to stop once in hall to rest. PT following pt. Will let them make recommendations for discharge. Gave pt CHF booklet. Encouraged her to start looking at it. Will go over on next visits. To recliner with call bell.   Graylon Good, RN BSN  06/12/2013 11:46 AM

## 2013-06-13 ENCOUNTER — Ambulatory Visit: Payer: Medicare Other | Admitting: Internal Medicine

## 2013-06-13 LAB — BASIC METABOLIC PANEL
BUN: 52 mg/dL — AB (ref 6–23)
CO2: 24 meq/L (ref 19–32)
Calcium: 9.6 mg/dL (ref 8.4–10.5)
Chloride: 97 mEq/L (ref 96–112)
Creatinine, Ser: 1.57 mg/dL — ABNORMAL HIGH (ref 0.50–1.10)
GFR calc Af Amer: 40 mL/min — ABNORMAL LOW (ref >90)
GFR calc non Af Amer: 35 mL/min — ABNORMAL LOW (ref >90)
Glucose, Bld: 93 mg/dL (ref 70–99)
Potassium: 4.6 mEq/L (ref 3.7–5.3)
Sodium: 136 mEq/L — ABNORMAL LOW (ref 137–147)

## 2013-06-13 LAB — GLUCOSE, CAPILLARY
GLUCOSE-CAPILLARY: 98 mg/dL (ref 70–99)
GLUCOSE-CAPILLARY: 89 mg/dL (ref 70–99)
Glucose-Capillary: 96 mg/dL (ref 70–99)
Glucose-Capillary: 102 mg/dL — ABNORMAL HIGH (ref 70–99)

## 2013-06-13 LAB — MAGNESIUM: Magnesium: 1.7 mg/dL (ref 1.5–2.5)

## 2013-06-13 NOTE — Progress Notes (Signed)
CARDIAC REHAB PHASE I   PRE:  Rate/Rhythm: 78 SR  BP:  Supine:   Sitting: 92/47  Standing:    SaO2: 96%RA  MODE:  Ambulation: 420 ft   POST:  Rate/Rhythm: 84  BP:  Supine:   Sitting: 94/61  Standing:    SaO2: 95%RA 1425-1453 Pt walked 420 ft on RA with rolling walker with steady gait. Did not need to hold to pt. Stronger today and pt stated breathing better. Had interrupted pt from getting bath so set pt up and washed her back. Tolerated walk much better than yesterday.   Graylon Good, RN BSN  06/13/2013 2:51 PM

## 2013-06-13 NOTE — Discharge Summary (Signed)
Advanced Heart Failure Team  Discharge Summary   Patient ID: [name] MRN: [id number], DOB/AGE: 07-04-1950 63 y.o. Admit date: 06/07/2013 D/C date:     06/14/2013   PCP: Dr [name]  Primary Discharge Diagnoses:  1) PAH with cor pulmonale - started on Adcirca 40 mg daily - Not currently on Coumadin, Group 1 PAH, reasonable to use coumadin for INR 1.5-2.5, but she has been poorly compliant in the past and will leave the decision to initiate this up to Dr. Lamonte [name].  2) Acute/chronic RHF - Diuresed with IV lasix and milrinone gtt. Net negative 23 liters and weight change from 182 lbs to 146 lbs - Digoxin stopped with increased Cr  Secondary Discharge Diagnoses:  1) Scleroderma 2) DM2 - Metformin placed on hold with increase in Cr can try to restart on outpatient side. 3) Small pericardial effusion 4) Non-compliance 5) CKD, stage III - baseline Cr 1.6 6) generalized lymphadenopathy -  CT evaluation patient appears to have retrograde toenail, mesentery, inguinal lymphadenopathy 7) DNR/DNI  Hospital Course:  [name] is a 63 y/o woman, never smoker, hx scleroderma (formerly on chronic pred 52m), HTN, DM, sickle cell trait. She has been followed by LRafael Biharifor severe secondary PAH by Dr Pamela Garnetand then by Dr Pamela Sakaisince 11/2005. She has been on several PAH targeted therapies and coumadin over last 9 years, but has not been reliably on therapy of any kind since 09/2012 due primarily to compliance and ability to have labs performed. She has had best response to Tyvaso (+ bosentan) with an improvement in 6 minute walk and in PASP from 102 mmHg (7/09) to 42 mmHg (9/10). Since stopping Tyvaso her PASP has risen to 80's by TTE 6/13 and 11/14. Surprisingly, she has never required supplemental O2. Most recent efforts have been to start macitentan in 11/14, however she developed nausea and diarrhea and macitentan was stopped early 3/15.  [facility]  progressive edema, dyspnea, and lethargy. Her diuretics had been increased on the outpatient side with minimal improvement. Pertinent labs on admission were negative troponin, K+ 5.4, Cr 2.0, Hgb 9.3, pro-BNP 3360, magnesium 0.9 and TSH 4.5. CT abdomen with pericardial effusion, tiny pleural effusions, and mild ascites with scattered abdominal inguinal lymphadenopathy.  She was started on IV diuretics and had a repeat ECHO which showed EF ~50 % with severe RV dysfunction with evidence of cor pulmonale, RVSP 97 mm HG. [facility].   Her volume status remained elevated and she was switched to an IV lasix gtt 10 mg/hr and started on milrinone 0.25 mcg with brisk diuresis. It was discussed about her home medications and with her being non-compliant she was not considered a candidate for home milrinone or Flolan. She refused inhaled agents or Tyvaso and could not use bosentan since she wont f/u with LFT checks and it was decided to start her on Adcirca 20 mg day. She tolerated Adcirca well and it was titrated up to 40 mg daily. She diuresed a total net negative of 22 liters and was transitioned to PO lasix and the milrinone was weaned off.  PT and CR worked with the patient while she was here and felt with her deconditioning that she could benefit from SNP placement, however the patient refused and was set up for HHRN/PT.   As above during her stay on CT evaluation patient appears to have retrograde toenail,  mesentery, inguinal lymphadenopathy and will need to have continued work-up on outpatient side.   On day of discharge vitals were stable and she was ambulating 550 ft with no O2. Extensive education was given to the patient about taking medications daily, weighing daily, following a low salt diet and drinking less than 2L a day. The HF Assistance program provided Adcirca for the patient for 3 days, which she was instructed to pick up at the outpatient  pharmacy free of charge. On Monday she is to follow up with Dr. Agustina [name] office to pick up samples and their office is working on getting medication for the patient long term.    She will need a BMET at follow up and will need to have her inguinal lymph node followed up on as to whether she needs outpatient biopsy.   Discharge Weight Range: 146-150 lbs Discharge Vitals: Blood pressure 102/67, pulse 82, temperature 98.1 F (36.7 C), temperature source Oral, resp. rate 18, height _0  (1.626 m), weight 146 lb (66.225 kg), SpO2 95.00%.  Labs: Lab Results  Component Value Date   WBC 11.5* 06/10/2013   HGB 11.1* 06/10/2013   HCT 32.2* 06/10/2013   MCV 79.7 06/10/2013   PLT 231 06/10/2013    Recent Labs Lab 06/08/13 0455  06/14/13 0820  NA 141  < > 137  K 5.6*  < > 5.0  CL 106  < > 98  CO2 16*  < > 23  BUN 29*  < > 52*  CREATININE 1.89*  < > 1.38*  CALCIUM 7.5*  < > 9.9  PROT 7.3  --   --   BILITOT 0.8  --   --   ALKPHOS 159*  --   --   ALT 13  --   --   AST 44*  --   --   GLUCOSE 76  < > 80  < > = values in this interval not displayed. Lab Results  Component Value Date   CHOL 98 04/16/2013   HDL 31* 04/16/2013   LDLCALC 48 04/16/2013   TRIG 93 04/16/2013   BNP (last 3 results)  Recent Labs  06/07/13 1845 06/09/13 0825  PROBNP 3360.0* 2707.0*    Diagnostic Studies/Procedures   No results found.  Discharge Medications     Medication List    STOP taking these medications       ADVIL 200 MG tablet  Generic drug:  ibuprofen     LANOXIN 0.125 MG tablet  Generic drug:  digoxin     lisinopril-hydrochlorothiazide 20-25 MG per tablet  Commonly known as:  PRINZIDE,ZESTORETIC     Macitentan 10 MG Tabs     metFORMIN 500 MG tablet  Commonly known as:  GLUCOPHAGE      TAKE these medications       aspirin 81 MG EC tablet  Take 1 tablet (81 mg total) by mouth daily.     esomeprazole 40 MG capsule  Commonly known as:  NEXIUM  Take by mouth 2 (two) times daily.      fenofibrate 48 MG tablet  Commonly known as:  TRICOR  take 1 tablet by mouth once daily     FLUoxetine 20 MG capsule  Commonly known as:  PROZAC  take 1 capsule by mouth every morning     furosemide 40 MG tablet  Commonly known as:  LASIX  Take 1 tablet (40 mg total) by mouth 2 (two) times daily.     gabapentin 100 MG capsule  Commonly known as:  NEURONTIN  take 1 capsule by mouth twice a day     glucose blood test strip  Check Blood Glucose 2-3 times a day.Dx Code:250.60.     HYDROcodone-acetaminophen 10-325 MG per tablet  Commonly known as:  NORCO  Take 4 tablets by mouth every 4 (four) hours as needed for moderate pain.     LYRICA 150 MG capsule  Generic drug:  pregabalin  take 1 capsule by mouth three times a day     metoCLOPramide 10 MG tablet  Commonly known as:  REGLAN  Take 1/2 tablet by mouth before meals and at bedtime     ONE TOUCH LANCETS Misc  Check Blood Glucose 2-3 times a day.Dx code: 250.60.     Tadalafil (PAH) 20 MG Tabs  Take 2 tablets (40 mg total) by mouth daily.        Disposition   The patient will be discharged in stable condition to home. Discharge Orders   Future Appointments Provider Department Dept Phone   06/25/2013 9:45 AM Marrion Coy, MD [place] [phone]   06/25/2013 10:30 AM Inda Castle, MD [facility] [phone]   07/04/2013 11:30 AM Collene Gobble, MD [facility] [phone]   08/17/2013 11:00 AM Dennie Bible, NP [facility] [phone]   08/22/2013 9:45 AM Sid Falcon, MD [place] [phone]   Future Orders Complete By Expires   Contraindication beta blocker-discharge  As directed    Contraindication to ARB at discharge  As directed    Scheduling Instructions:     CRI   Diet - low sodium heart healthy  As directed    Discharge instructions  As directed    Comments:     -Go to Dr. Agustina [name]  Office on Monday to get your Adcirca (tadaladil) samples.   Heart Failure patients record your daily weight using the same scale at the same time of day  As directed    Increase activity slowly  As directed    STOP any activity that causes chest pain, shortness of breath, dizziness, sweating, or exessive weakness  As directed      Follow-up Information   Follow up with [facility]. (Registered Nurse and Physical Therapy Services to start within 24-48 hours of discharge.)    Contact information:   [address] [place] [address] [phone]       Follow up with Pamela Chiquito, MD On 06/25/2013. (@ 9:45 am)    Specialty:  Internal Medicine   Contact information:   [place] [address] [phone]       Follow up with Collene Gobble., MD On 07/04/2013. (@ 11:30 am)    Specialty:  Pulmonary Disease   Contact information:   [address] [address] [phone]         Duration of Discharge Encounter: Greater than 35 minutes   Signed, Rande Brunt  06/14/2013, 4:12 PM

## 2013-06-13 NOTE — Progress Notes (Signed)
Prior [facility] consult note from Dr. Haroldine Laws  HPI:    [name] is a 63 y/o woman, never smoker, hx scleroderma (formerly on chronic pred 47m), HTN, DM, sickle cell trait. She has been followed by LRafael Biharifor severe secondary PAH by Dr SAlva Garnetand then by Dr BLamonte Sakaisince 11/2005. She has been on several PAH targeted therapies and coumadin over last 9 years, but has not been reliably on therapy of any kind since 09/2012 due primarily to compliance and ability to have labs performed. She has had best response to Tyvaso (+ bosentan) with an improvement in 6 minute walk and in PASP from 102 mmHg (7/09) to 42 mmHg (9/10). Since stopping Tyvaso her PASP has risen to 80's by TTE 6/13 and 11/14. Surprisingly, she has never required supplemental O2. Most recent efforts have been to start macitentan in 11/14. She developed nausea and diarrhea and macitentan was stopped early 3/15.  Admitted 3/26 with decompensated R heart failure.Echo reviewed. LVEF ~50% Severe RV dysfunction with evidence of cor pulmonale RVSP 915mG. Small posterior pericardial effusion  Started on IV lasix and milrinone on 3/27. On milrinone 0.125. Diuretics changed to 40 mg PO BID yesterday.Weight down 3 lbs, 24 hr I/O -1.3 liters. Denies SOB, orthopnea or CP. Ambulating in the hall 250-400 ft with minimal DOE. Cr stable 1.57  Objective:    Vital Signs:   Temp:  [97.4 F (36.3 C)-98.4 F (36.9 C)] 97.8 F (36.6 C) (04/01 0430) Pulse Rate:  [70-85] 70 (04/01 0430) Resp:  [18] 18 (04/01 0430) BP: (86-111)/(40-60) 91/60 mmHg (04/01 0430) SpO2:  [95 %-98 %] 95 % (04/01 0430) Weight:  [148 lb 9.4 oz (67.4 kg)-151 lb 3.8 oz (68.6 kg)] 148 lb 9.4 oz (67.4 kg) (04/01 0430) Last BM Date: 06/12/13  Weight change: Filed Weights   06/12/13 0400 06/12/13 2137 06/13/13 0430  Weight: 151 lb 3.8 oz (68.6 kg) 151 lb 3.8 oz (68.6 kg) 148 lb 9.4 oz (67.4 kg)    Intake/Output:   Intake/Output Summary (Last 24 hours) at 06/13/13 0849 Last  data filed at 06/13/13 0550  Gross per 24 hour  Intake  519.4 ml  Output   2000 ml  Net -1480.6 ml     Physical Exam: General:  Fatigued appearing; No resp difficulty HEENT: normal Neck: supple. JVP 7 cm.  Carotids 2+ bilat; no bruits. No lymphadenopathy or thryomegaly appreciated. Cor: +RV lift with right-sided S3.  Regular rate & rhythm. 3/6 TR. Loud P2 Lungs: clear Abdomen: soft, nontender, non distended. Liver edge down ~4cm. No bruits or masses. Good bowel sounds. Extremities: no cyanosis, clubbing, rash, no edema Neuro: alert & orientedx3, cranial nerves grossly intact. moves all 4 extremities w/o difficulty. Affect pleasant  Telemetry: SR 60-70  Labs: Basic Metabolic Panel:  Recent Labs Lab 06/09/13 0825 06/10/13 0320 06/11/13 0337 06/12/13 0345 06/13/13 0318  NA 139 134* 136* 134* 136*  K 4.5 4.1 4.1 4.1 4.6  CL 101 94* 93* 93* 97  CO2 _0 GLUCOSE 106* 103* 112* 114* 93  BUN 29* 32* 44* 51* 52*  CREATININE 1.74* 1.89* 2.12* 1.89* 1.57*  CALCIUM 8.4 9.0 8.9 9.1 9.6  MG 1.3* 1.3* 1.7 1.4* 1.7  PHOS 4.0 4.0 5.0* 4.2  --     Liver Function Tests:  Recent Labs Lab 06/07/13 1845 06/08/13 0455  AST 29 44*  ALT 14 13  ALKPHOS 162* 159*  BILITOT 0.6 0.8  PROT 7.5 7.3  ALBUMIN 3.1* 3.0*  Recent Labs Lab 06/07/13 1845  LIPASE 15   No results found for this basename: AMMONIA,  in the last 168 hours  CBC:  Recent Labs Lab 06/07/13 1845 06/08/13 0307 06/08/13 0455 06/10/13 0320  WBC 7.9 8.0  --  11.5*  NEUTROABS  --  3.2  --   --   HGB 9.3* 9.7*  --  11.1*  HCT 28.7* 28.7*  --  32.2*  MCV 81.8 80.2  --  79.7  PLT 184 170 178 231    Cardiac Enzymes:  Recent Labs Lab 06/07/13 1845 06/07/13 2347 06/08/13 0454 06/08/13 1050  TROPONINI <0.30 <0.30 <0.30 <0.30    BNP: BNP (last 3 results)  Recent Labs  06/07/13 1845 06/09/13 0825  PROBNP 3360.0* 2707.0*    CBG:  Recent Labs Lab 06/12/13 0811 06/12/13 1157  06/12/13 1632 06/12/13 2133 06/13/13 0736  GLUCAP 102* 130* 105* 104* 96    Coagulation Studies: No results found for this basename: LABPROT, INR,  in the last 72 hours   Imaging: No results found.   Medications:     Current Medications: . aspirin EC  81 mg Oral Daily  . furosemide  40 mg Oral BID  . gabapentin  100 mg Oral BID  . heparin  5,000 Units Subcutaneous 3 times per day  . insulin aspart  0-5 Units Subcutaneous QHS  . insulin aspart  0-9 Units Subcutaneous TID WC  . pantoprazole  40 mg Oral BID  . pregabalin  150 mg Oral TID  . sodium chloride  3 mL Intravenous Q12H  . Tadalafil (PAH)  40 mg Oral Daily    Infusions: . milrinone 0.125 mcg/kg/min (06/12/13 1000)     Assessment:   1. PAH with cor pulmonale 2. Scleroderma 3. DM 4. Small pericardial effusion 5. Noncompliance 6. CKD, stage III - baseline Cr 1.6 7. DNR/DNI  Plan/Discussion:    She presents with decompensated/end-stage RHF with marked volume overload  in setting of severe PAH and medication noncompliance. Echo reviewed and PA pressures continue to climb. She previously had very good response to Tyvaso but her course has been complicated by severe noncompliance and refusal to take some of her meds and comply with LFT monitoring. She has very limited insight into her situation and combined with her lack of compliance and home support, she is not a candidate for IV therapies.   She responded well to milrinone and IV lasix gtt. Net negative -21 liters. IV lasix stopped and switched to PO yesterday. Cr stable 1.57. Will stop milrinone today.  She is not candidate for home milrinone or Flolan. She refuses inhaled agents (Tyvaso). Cannot use bosentan as she won't f/u with LFT checks. Had diarrhea on Opsumit and stopped it. She is now on Adcirca 40 mg daily and would consider adding Letairis after d/c as no LFT monitoring required, she is managed by Dr. Lamonte Sakai.  Likely home in the next few days. Dr.  Agustina Caroli office is working on Therapist, art approved and getting samples.    Hospice discussed with her and she refuses at this point. She is not interested in SNF and will be going [facility] with [facility]. Continue to work with Cardiac rehab.   Rande Brunt, NP-C 8:49 AM  Length of Stay: 6  Patient seen with NP, agree with the above note.  Stop milrinone today.  She has diuresed well and weight is down considerably.  She is on po Lasix with stable creatinine.  We have started her on  Adcirca and are working on getting her samples for home until the medication can be delivered.  If she does well today, home tomorrow.  Her treatment in the past unfortunately has been limited by noncompliance.   [name] 06/13/2013 9:38 AM

## 2013-06-14 LAB — BASIC METABOLIC PANEL
BUN: 52 mg/dL — AB (ref 6–23)
CO2: 23 meq/L (ref 19–32)
Calcium: 9.9 mg/dL (ref 8.4–10.5)
Chloride: 98 mEq/L (ref 96–112)
Creatinine, Ser: 1.38 mg/dL — ABNORMAL HIGH (ref 0.50–1.10)
GFR calc Af Amer: 47 mL/min — ABNORMAL LOW (ref >90)
GFR, EST NON AFRICAN AMERICAN: 40 mL/min — AB (ref >90)
Glucose, Bld: 80 mg/dL (ref 70–99)
POTASSIUM: 5 meq/L (ref 3.7–5.3)
Sodium: 137 mEq/L (ref 137–147)

## 2013-06-14 LAB — GLUCOSE, CAPILLARY
GLUCOSE-CAPILLARY: 89 mg/dL (ref 70–99)
Glucose-Capillary: 96 mg/dL (ref 70–99)

## 2013-06-14 LAB — T3, FREE: T3, Free: 2.4 pg/mL (ref 2.3–4.2)

## 2013-06-14 LAB — T4, FREE: Free T4: 1.16 ng/dL (ref 0.80–1.80)

## 2013-06-14 MED ORDER — TADALAFIL (PAH) 20 MG PO TABS: 40.0000 mg | ORAL_TABLET | Freq: Every day | ORAL | Status: DC

## 2013-06-14 MED ORDER — FUROSEMIDE 40 MG PO TABS: 40.0000 mg | ORAL_TABLET | Freq: Two times a day (BID) | ORAL | Status: DC

## 2013-06-14 MED ORDER — ASPIRIN 81 MG PO TBEC: 81.0000 mg | DELAYED_RELEASE_TABLET | Freq: Every day | ORAL | Status: DC

## 2013-06-14 NOTE — Progress Notes (Signed)
DC IV, DC Tele, DC Home. Discharge instructions and home medications discussed with patient and patient's sister. Patient and sister denied any questions or concerns at this time. Patient leaving unit via wheelchair and appears in no acute distress.

## 2013-06-14 NOTE — Discharge Instructions (Signed)
Heart Failure Heart failure means your heart has trouble pumping blood. This makes it hard for your body to work well. Heart failure is usually a long-term (chronic) condition. You must take good care of yourself and follow your doctor's treatment plan. HOME CARE  Take your heart medicine as told by your doctor.  Do not stop taking medicine unless your doctor tells you to.  Do not skip any dose of medicine.  Refill your medicines before they run out.  Take other medicines only as told by your doctor or pharmacist.  Stay active if told by your doctor. The elderly and people with severe heart failure should talk with a doctor about physical activity.  Eat heart healthy foods. Choose foods that are without trans fat and are low in saturated fat, cholesterol, and salt (sodium). This includes fresh or frozen fruits and vegetables, fish, lean meats, fat-free or low-fat dairy foods, whole grains, and high-fiber foods. Lentils and dried peas and beans (legumes) are also good choices.  Limit salt if told by your doctor.  Cook in a healthy way. Roast, grill, broil, bake, poach, steam, or stir-fry foods.  Limit fluids as told by your doctor.  Weigh yourself every morning. Do this after you pee (urinate) and before you eat breakfast. Write down your weight to give to your doctor.  Take your blood pressure and write it down if your doctor tell you to.  Ask your doctor how to check your pulse. Check your pulse as told.  Lose weight if told by your doctor.  Stop smoking or chewing tobacco. Do not use gum or patches that help you quit without your doctor's approval.  Schedule and go to doctor visits as told.  Nonpregnant women should have no more than 1 drink a day. Men should have no more than 2 drinks a day. Talk to your doctor about drinking alcohol.  Stop illegal drug use.  Stay current with shots (immunizations).  Manage your health conditions as told by your doctor.  Learn to manage  your stress.  Rest when you are tired.  If it is really hot outside:  Avoid intense activities.  Use air conditioning or fans, or get in a cooler place.  Avoid caffeine and alcohol.  Wear loose-fitting, lightweight, and light-colored clothing.  If it is really cold outside:  Avoid intense activities.  Layer your clothing.  Wear mittens or gloves, a hat, and a scarf when going outside.  Avoid alcohol.  Learn about heart failure and get support as needed.  Get help to maintain or improve your quality of life and your ability to care for yourself as needed. GET HELP IF:   You gain 03 lb/1.4 kg or more in 1 day or 05 lb/2.3 kg in a week.  You are more short of breath than usual.  You cannot do your normal activities.  You tire easily.  You cough more than normal, especially with activity.  You have any or more puffiness (swelling) in areas such as your hands, feet, ankles, or belly (abdomen).  You cannot sleep because it is hard to breathe.  You feel like your heart is beating fast (palpitations).  You get dizzy or lightheaded when you stand up. GET HELP RIGHT AWAY IF:   You have trouble breathing.  There is a change in mental status, such as becoming less alert or not being able to focus.  You have chest pain or discomfort.  You faint. MAKE SURE YOU:   Understand these  instructions.  Will watch your condition.  Will get help right away if you are not doing well or get worse. Document Released: 12/09/2007 Document Revised: 06/26/2012 Document Reviewed: 09/30/2011 [facility] Patient Information 2014 [place], [place].

## 2013-06-14 NOTE — Progress Notes (Signed)
Physical Therapy Treatment Patient Details Name: [name] MRN: [id number] DOB: 1950-08-24 Today's Date: 06/14/2013    History of Present Illness 63 y/o female admitted with decompensated R heart failure and dyspnea.    PT Comments    Pt with improved endurance and gait today however continues to demonstrate significant balance deficits. Pt also noted to have limited bil shoulder flexion from lack of use and performed finger walks on wall x 10 bilaterally with pt encouraged to continue at home as well as use grab bar for shower with eyes closed. Pt educated for continued balance deficits and fall risk and encouraged to walk daily with AD at home.   Follow Up Recommendations  Home health PT;Supervision - Intermittent     Equipment Recommendations       Recommendations for Other Services       Precautions / Restrictions Precautions Precautions: Fall Restrictions Weight Bearing Restrictions: No    Mobility  Bed Mobility               General bed mobility comments: pt standing at sink on arrival  Transfers Overall transfer level: Modified independent     Sit to Stand: Modified independent (Device/Increase time)            Ambulation/Gait Ambulation/Gait assistance: Supervision Ambulation Distance (Feet): 550 Feet Assistive device: None Gait Pattern/deviations: Step-through pattern;Decreased stride length   Gait velocity interpretation: >2.62 ft/sec, indicative of independent Presenter, broadcasting [place] (Stroke Patients Only)       Balance     Sitting balance-Leahy Scale: Normal       Standing balance-Leahy Scale: Good   Single Leg Stance - Right Leg: 2 Single Leg Stance - Left Leg: 3 Tandem Stance - Right Leg: 4 Tandem Stance - Left Leg: 5 Rhomberg - Eyes Opened: 60 Rhomberg - Eyes Closed: 30 High level balance activites: Side stepping;Braiding;Backward walking;Direction  changes High Level Balance Comments: pt continues to have LOB with challenges, unable to turn 360degrees in less than 6 sec, very slow cautious movement with braiding and backward walking with minguard for all. Improved ability with standing with eyes closed today    Cognition Arousal/Alertness: Awake/alert Behavior During Therapy: Flat affect Overall Cognitive Status: Within Functional Limits for tasks assessed                      Exercises      General Comments        Pertinent Vitals/Pain No pain VSS    Home Living                      Prior Function            PT Goals (current goals can now be found in the care plan section) Progress towards PT goals: Progressing toward goals    Frequency       PT Plan Current plan remains appropriate    Co-evaluation             End of Session Equipment Utilized During Treatment: Gait belt Activity Tolerance: Patient tolerated treatment well Patient left: in chair;with call bell/phone within reach     Time: 0950-1013 PT Time Calculation (min): 23 min  Charges:  $Gait Training: 8-22 mins $Neuromuscular Re-education: 8-22 mins  G CodesLanetta Inch Beth June 23, 2013, 11:12 AM Elwyn Reach, [place]

## 2013-06-14 NOTE — Progress Notes (Signed)
Patient ID: [name], female   DOB: [birth year], 63 y.o.   MRN: [id number]     HPI:    [name] is a 63 y/o woman, never smoker, hx scleroderma (formerly on chronic pred 52m), HTN, DM, sickle cell trait. She has been followed by LRafael Biharifor severe secondary PAH by Dr SAlva Garnetand then by Dr BLamonte Sakaisince 11/2005. She has been on several PAH targeted therapies and coumadin over last 9 years, but has not been reliably on therapy of any kind since 09/2012 due primarily to compliance and ability to have labs performed. She has had best response to Tyvaso (+ bosentan) with an improvement in 6 minute walk and in PASP from 102 mmHg (7/09) to 42 mmHg (9/10). Since stopping Tyvaso her PASP has risen to 80's by TTE 6/13 and 11/14. Surprisingly, she has never required supplemental O2. Most recent efforts have been to start macitentan in 11/14. She developed nausea and diarrhea and macitentan was stopped early 3/15.  Admitted 3/26 with decompensated R heart failure.Echo reviewed. LVEF ~50% Severe RV dysfunction with evidence of cor pulmonale RVSP 965mG. Small posterior pericardial effusion  Started on IV lasix and milrinone on 3/27. Milrinone stopped 4/1. Diuretics changed to 40 mg PO BID Lasix. Weight down another 2 lbs. Denies SOB, orthopnea or CP. Ambulating in the hall 250-400 ft with minimal DOE. Pending creatinine.   Objective:    Vital Signs:   Temp:  [98 F (36.7 C)-98.1 F (36.7 C)] 98.1 F (36.7 C) (04/02 0522) Pulse Rate:  [64-77] 64 (04/02 0522) Resp:  [18] 18 (04/02 0522) BP: (82-109)/(40-64) 95/58 mmHg (04/02 0522) SpO2:  [95 %-100 %] 95 % (04/02 0522) Weight:  [146 lb (66.225 kg)] 146 lb (66.225 kg) (04/02 0522) Last BM Date: 06/13/13  Weight change: Filed Weights   06/12/13 2137 06/13/13 0430 06/14/13 0522  Weight: 151 lb 3.8 oz (68.6 kg) 148 lb 9.4 oz (67.4 kg) 146 lb (66.225 kg)    Intake/Output:   Intake/Output Summary (Last 24 hours) at 06/14/13 0847 Last data filed  at 06/14/13 0528  Gross per 24 hour  Intake   1080 ml  Output   1925 ml  Net   -845 ml     Physical Exam: General:  Fatigued appearing; No resp difficulty HEENT: normal Neck: supple. JVP 7 cm.  Carotids 2+ bilat; no bruits. No lymphadenopathy or thryomegaly appreciated. Cor: +RV lift with right-sided S3.  Regular rate & rhythm. 3/6 TR. Loud P2 Lungs: clear Abdomen: soft, nontender, non distended. Liver edge down ~4cm. No bruits or masses. Good bowel sounds. Extremities: no cyanosis, clubbing, rash, no edema Neuro: alert & orientedx3, cranial nerves grossly intact. moves all 4 extremities w/o difficulty. Affect pleasant  Telemetry: SR 60-70  Labs: Basic Metabolic Panel:  Recent Labs Lab 06/09/13 0825 06/10/13 0320 06/11/13 0337 06/12/13 0345 06/13/13 0318  NA 139 134* 136* 134* 136*  K 4.5 4.1 4.1 4.1 4.6  CL 101 94* 93* 93* 97  CO2 _0 GLUCOSE 106* 103* 112* 114* 93  BUN 29* 32* 44* 51* 52*  CREATININE 1.74* 1.89* 2.12* 1.89* 1.57*  CALCIUM 8.4 9.0 8.9 9.1 9.6  MG 1.3* 1.3* 1.7 1.4* 1.7  PHOS 4.0 4.0 5.0* 4.2  --     Liver Function Tests:  Recent Labs Lab 06/07/13 1845 06/08/13 0455  AST 29 44*  ALT 14 13  ALKPHOS 162* 159*  BILITOT 0.6 0.8  PROT 7.5 7.3  ALBUMIN 3.1*  3.0*    Recent Labs Lab 06/07/13 1845  LIPASE 15   No results found for this basename: AMMONIA,  in the last 168 hours  CBC:  Recent Labs Lab 06/07/13 1845 06/08/13 0307 06/08/13 0455 06/10/13 0320  WBC 7.9 8.0  --  11.5*  NEUTROABS  --  3.2  --   --   HGB 9.3* 9.7*  --  11.1*  HCT 28.7* 28.7*  --  32.2*  MCV 81.8 80.2  --  79.7  PLT 184 170 178 231    Cardiac Enzymes:  Recent Labs Lab 06/07/13 1845 06/07/13 2347 06/08/13 0454 06/08/13 1050  TROPONINI <0.30 <0.30 <0.30 <0.30    BNP: BNP (last 3 results)  Recent Labs  06/07/13 1845 06/09/13 0825  PROBNP 3360.0* 2707.0*    CBG:  Recent Labs Lab 06/13/13 0736 06/13/13 1110 06/13/13 1648  06/13/13 2138 06/14/13 0644  GLUCAP 96 98 89 102* 96    Coagulation Studies: No results found for this basename: LABPROT, INR,  in the last 72 hours   Imaging: No results found.   Medications:     Current Medications: . aspirin EC  81 mg Oral Daily  . furosemide  40 mg Oral BID  . gabapentin  100 mg Oral BID  . heparin  5,000 Units Subcutaneous 3 times per day  . insulin aspart  0-5 Units Subcutaneous QHS  . insulin aspart  0-9 Units Subcutaneous TID WC  . pantoprazole  40 mg Oral BID  . pregabalin  150 mg Oral TID  . sodium chloride  3 mL Intravenous Q12H  . Tadalafil (PAH)  40 mg Oral Daily    Infusions:     Assessment:   1. PAH with cor pulmonale 2. Scleroderma 3. DM 4. Small pericardial effusion 5. Noncompliance 6. CKD, stage III - baseline Cr 1.6 7. DNR/DNI  Plan/Discussion:    She presents with decompensated/end-stage RHF with marked volume overload  in setting of severe PAH and medication noncompliance. Echo reviewed and PA pressures continue to climb. She previously had very good response to Tyvaso but her course has been complicated by severe noncompliance and refusal to take some of her meds and comply with LFT monitoring. She has very limited insight into her situation and combined with her lack of compliance and home support, she is not a candidate for IV therapies.   She responded well to milrinone and IV lasix gtt. IV lasix stopped and switched to PO yesterday. Now off milrinone and stable.  She is not candidate for home milrinone or Flolan. She refuses inhaled agents (Tyvaso). Cannot use bosentan as she won't f/u with LFT checks. Had diarrhea on Opsumit and stopped it. She is now on Adcirca 40 mg daily and would consider adding Letairis after d/c as no LFT monitoring required.  She is managed by Dr. Lamonte Sakai.  I think that she can go home today. Dr. Agustina Caroli office is working on Therapist, art approved and getting samples.  She will also go home on Lasix  40 mg po bid.  She will followup with Dr Lamonte Sakai and with primary care ([facility]).  After discharge, she will need outpatient attention to her thyroid status (mildly elevated TSH, repeating today with free T3/T4) and inguinal lymph node (needs outpatient biopsy).  With Group 1 PAH, reasonable to use coumadin for INR 1.5-2.5, but she has been poorly compliant in the past and will leave the decision to initiate this up to Dr. Lamonte Sakai.   Hospice discussed with  her and she refuses at this point. She is not interested in SNF and will be going home with [facility].   [name] 06/14/2013 8:53 AM

## 2013-06-14 NOTE — Progress Notes (Signed)
Patient UU:EKCM DA MICHELLE      DOB: 1951-02-16      KLK:[id number]  Consult received for goals of care for patient who has been non compliant and resistant to multiple support systems including Hospice referral.  Discussed with Heart Failure NP, will see in am if patient remains hospitalized.  Unfortunately, can not see today due to timing of request.  We could consider outpatient collaboration if patient willing. Will continue to work with Heart Failure team and if patient remains hospitalized for care reasons will see as asap in the am.   Laquonda Welby L. Lovena Le, MD MBA The Palliative Medicine Team at [facility] Phone: [phone] Pager: [phone]

## 2013-06-14 NOTE — Progress Notes (Signed)
1030-1050 Reviewed CHF booklet with pt stressing when to call MD with weight gain and symptoms. Gave low sodium handouts and encouraged 2000 mg restriction. Pt not interested in discussing walking as exercise. Said did not like to walk. Encouraged short walks in house but still did not seem interested. Encouraged 2L fluid restriction. Pt had just walked with PT so did not walk again. Graylon Good RN BSN 06/14/2013 10:52 AM

## 2013-06-14 NOTE — Progress Notes (Signed)
Pt. Ambulated in the hallway with the walker O2 sats. remains 95-98% room air with entire ambulation. Fairly tolerated.

## 2013-06-18 ENCOUNTER — Telehealth: Payer: Self-pay | Admitting: Emergency Medicine

## 2013-06-18 MED ORDER — TADALAFIL (PAH) 20 MG PO TABS: 40.0000 mg | ORAL_TABLET | Freq: Every day | ORAL | Status: DC

## 2013-06-18 NOTE — Telephone Encounter (Signed)
Spoke with Rod Holler  She states was told at hospital d/c to come and pick up samples of "med for her lungs" Chart review shows she needs samples of Adcirca  Samples given to Rod Holler for the pt  Nothing further needed

## 2013-06-25 ENCOUNTER — Encounter: Payer: Self-pay | Admitting: Gastroenterology

## 2013-06-25 ENCOUNTER — Ambulatory Visit: Payer: Medicare Other | Admitting: Internal Medicine

## 2013-06-25 ENCOUNTER — Ambulatory Visit (INDEPENDENT_AMBULATORY_CARE_PROVIDER_SITE_OTHER): Payer: Medicare Other | Admitting: Gastroenterology

## 2013-06-25 ENCOUNTER — Telehealth: Payer: Self-pay | Admitting: Emergency Medicine

## 2013-06-25 VITALS — BP 94/60 | HR 60 | Ht 64.0 in | Wt 152.6 lb

## 2013-06-25 DIAGNOSIS — K3184 Gastroparesis: Secondary | ICD-10-CM

## 2013-06-25 DIAGNOSIS — R1310 Dysphagia, unspecified: Secondary | ICD-10-CM

## 2013-06-25 DIAGNOSIS — R197 Diarrhea, unspecified: Secondary | ICD-10-CM

## 2013-06-25 NOTE — Assessment & Plan Note (Signed)
Currently asymptomatic from her esophageal stricture.

## 2013-06-25 NOTE — Patient Instructions (Signed)
Research will be speaking with you today

## 2013-06-25 NOTE — Assessment & Plan Note (Signed)
Stable asymptomatic.  Patient may consider enrollment in a gastroparesis trial.

## 2013-06-25 NOTE — Progress Notes (Signed)
_                                                                                                                History of Present Illness: 63 year old 76 female with history of pulmonary hypertension, diabetes, gastroparesis, systemic sclerosis, esophageal stricture, sickle cell trait, congestive heart failure, referred for evaluation of diarrhea.  Approximately month ago she was having severe diarrhea consisting of multiple loose stools daily.  She had been taking macitentan for pulmonary hypertension which was felt to be the source for her diarrhea.  Symptoms subsided when this medicine was discontinued.  At this point she has no complaint of diarrhea.  She has a history of an esophageal stricture but denies dysphagia.   Past Medical History  Diagnosis Date  . Secondary pulmonary hypertension     right heart cath 04/20/04  . Cough   . Allergic rhinitis, cause unspecified   . Type II or unspecified type diabetes mellitus without mention of complication, not stated as uncontrolled   . Degeneration of lumbar or lumbosacral intervertebral disc   . Esophageal reflux   . Systemic sclerosis   . Unspecified essential hypertension   . Rotator cuff tear   . Gastritis   . Sickle cell trait   . Arthritis   . Obesity   . Visual changes   . Scleroderma   . Diastolic dysfunction   . Trichomonas   . Vaginal bleeding   . Neuropathy   . CHF (congestive heart failure)    Past Surgical History  Procedure Laterality Date  . Partial hysterectomy  1983  . Tubal ligation    . Tonsillectomy    . Upper endoscopy with biopsy  03/10/2006  . Right shoulder arthroscopy, subacromial decompression    . Cardiac catheterization  04/24/2004  . Replacement total knee  11/2000   family history includes Diabetes in her mother and sister; Heart disease in her mother. Current Outpatient Prescriptions  Medication Sig Dispense Refill  . aspirin EC 81 MG EC tablet Take 1 tablet (81 mg  total) by mouth daily.  30 tablet  3  . esomeprazole (NEXIUM) 40 MG capsule Take by mouth 2 (two) times daily.        . fenofibrate (TRICOR) 48 MG tablet take 1 tablet by mouth once daily  30 tablet  11  . FLUoxetine (PROZAC) 20 MG capsule take 1 capsule by mouth every morning  30 capsule  2  . furosemide (LASIX) 40 MG tablet Take 1 tablet (40 mg total) by mouth 2 (two) times daily.  60 tablet  3  . gabapentin (NEURONTIN) 100 MG capsule take 1 capsule by mouth twice a day  60 capsule  6  . glucose blood test strip Check Blood Glucose 2-3 times a day.Dx Code:250.60.  100 each  3  . HYDROcodone-acetaminophen (NORCO) 10-325 MG per tablet Take 4 tablets by mouth every 4 (four) hours as needed for moderate pain.       Marland Kitchen LANOXIN 125 MCG tablet       .  LYRICA 150 MG capsule take 1 capsule by mouth three times a day  90 capsule  5  . metoCLOPramide (REGLAN) 10 MG tablet Take 1/2 tablet by mouth before meals and at bedtime  180 tablet  1  . ONE TOUCH LANCETS MISC Check Blood Glucose 2-3 times a day.Dx code: 250.60.  200 each  3  . Tadalafil, PAH, 20 MG TABS Take 2 tablets (40 mg total) by mouth daily.  60 tablet  0   No current facility-administered medications for this visit.   Allergies as of 06/25/2013 - Review Complete 06/25/2013  Allergen Reaction Noted  . Codeine    . Contrast media [iodinated diagnostic agents] Hives 08/03/2011  . Iohexol  03/04/2008  . Cephalexin Rash 05/01/2010  . Ciprofloxacin Rash 07/23/2010    reports that she has never smoked. She has never used smokeless tobacco. She reports that she does not drink alcohol or use illicit drugs.     Review of Systems: Pertinent positive and negative review of systems were noted in the above HPI section. All other review of systems were otherwise negative.  Vital signs were reviewed in today's medical record Physical Exam: General: Well developed , well nourished, no acute distress Skin: anicteric Head: Normocephalic and  atraumatic Eyes:  sclerae anicteric, EOMI Ears: Normal auditory acuity Mouth: No deformity or lesions Neck: Supple, no masses or thyromegaly Lungs: Clear throughout to auscultation Heart: Regular rate and rhythm; no murmurs, rubs or bruits Abdomen: Soft, non tender and non distended. No masses, hepatosplenomegaly or hernias noted. Normal Bowel sounds.  There is no succussion splash Rectal:deferred Musculoskeletal: Symmetrical with no gross deformities  Skin: No lesions on visible extremities Pulses:  Normal pulses noted Extremities: No clubbing, cyanosis, edema or deformities noted Neurological: Alert oriented x 4, grossly nonfocal Cervical Nodes:  No significant cervical adenopathy Inguinal Nodes: No significant inguinal adenopathy Psychological:  Alert and cooperative. Normal mood and affect  See Assessment and Plan under Problem List

## 2013-06-25 NOTE — Telephone Encounter (Signed)
[name] has been approved through 06-21-14. Called to inform pt but received no answer and no VM, WCB

## 2013-06-25 NOTE — Assessment & Plan Note (Signed)
Diarrhea was associated with macitentan and subsided when this drug was discontinued.  Plan no further GI workup at this time to

## 2013-06-27 ENCOUNTER — Encounter: Payer: Self-pay | Admitting: Internal Medicine

## 2013-06-27 ENCOUNTER — Ambulatory Visit (INDEPENDENT_AMBULATORY_CARE_PROVIDER_SITE_OTHER): Payer: Medicare Other | Admitting: Internal Medicine

## 2013-06-27 VITALS — BP 86/58 | HR 67 | Temp 97.7°F | Ht 64.0 in | Wt 154.6 lb

## 2013-06-27 DIAGNOSIS — R197 Diarrhea, unspecified: Secondary | ICD-10-CM

## 2013-06-27 DIAGNOSIS — N179 Acute kidney failure, unspecified: Secondary | ICD-10-CM

## 2013-06-27 DIAGNOSIS — I509 Heart failure, unspecified: Secondary | ICD-10-CM

## 2013-06-27 DIAGNOSIS — I2789 Other specified pulmonary heart diseases: Secondary | ICD-10-CM

## 2013-06-27 DIAGNOSIS — E1149 Type 2 diabetes mellitus with other diabetic neurological complication: Secondary | ICD-10-CM

## 2013-06-27 DIAGNOSIS — K3184 Gastroparesis: Secondary | ICD-10-CM

## 2013-06-27 DIAGNOSIS — I10 Essential (primary) hypertension: Secondary | ICD-10-CM

## 2013-06-27 DIAGNOSIS — E1142 Type 2 diabetes mellitus with diabetic polyneuropathy: Secondary | ICD-10-CM

## 2013-06-27 DIAGNOSIS — I50811 Acute right heart failure: Secondary | ICD-10-CM

## 2013-06-27 LAB — BASIC METABOLIC PANEL WITH GFR
BUN: 48 mg/dL — ABNORMAL HIGH (ref 6–23)
CHLORIDE: 104 meq/L (ref 96–112)
CO2: 24 mEq/L (ref 19–32)
Calcium: 9.4 mg/dL (ref 8.4–10.5)
Creat: 1.47 mg/dL — ABNORMAL HIGH (ref 0.50–1.10)
GFR, Est African American: 44 mL/min — ABNORMAL LOW
GFR, Est Non African American: 38 mL/min — ABNORMAL LOW
Glucose, Bld: 68 mg/dL — ABNORMAL LOW (ref 70–99)
POTASSIUM: 4.5 meq/L (ref 3.5–5.3)
Sodium: 142 mEq/L (ref 135–145)

## 2013-06-27 LAB — GLUCOSE, CAPILLARY: Glucose-Capillary: 85 mg/dL (ref 70–99)

## 2013-06-27 NOTE — Progress Notes (Signed)
Patient ID: SHERILYNN DIEU, female   DOB: 11-29-1950, 63 y.o.   MRN: [id number]    Subjective:   Patient ID: REMMY CRASS female   DOB: 1950/08/22 63 y.o.   MRN: [id number]  HPI: Ms.Shantea MYIAH PETKUS is a 63 y.o. female who presents for hospital follow up after being treated for recent heart failure exacerbation in setting of [place]. She has done well since discharge. She has no complaints today. She has difficulty explaining the medications that she is taking and why she is taking them. She states that she is not taking lasix and was told to not take it on discharge. However, her medication sheet shows that she should be taking furosemide 40 mg qd. The patient states that she had been taking the furosemide 40 mg qd. She notes that she weighs about 10 lbs more today than at the time of discharge. She has not noticed and welling in her legs or SOB or increased exertional dyspnea. She denies CP. She denies diarrhea.  She continues to report low intake of food. She has a small snack in the morning and then eats supper. She does not eat lunch or breakfast.    Past Medical History  Diagnosis Date  . Secondary pulmonary hypertension     right heart cath 04/20/04  . Cough   . Allergic rhinitis, cause unspecified   . Type II or unspecified type diabetes mellitus without mention of complication, not stated as uncontrolled   . Degeneration of lumbar or lumbosacral intervertebral disc   . Esophageal reflux   . Systemic sclerosis   . Unspecified essential hypertension   . Rotator cuff tear   . Gastritis   . Sickle cell trait   . Arthritis   . Obesity   . Visual changes   . Scleroderma   . Diastolic dysfunction   . Trichomonas   . Vaginal bleeding   . Neuropathy   . CHF (congestive heart failure)    Current Outpatient Prescriptions  Medication Sig Dispense Refill  . fenofibrate (TRICOR) 48 MG tablet take 1 tablet by mouth once daily  30 tablet  11  . FLUoxetine (PROZAC) 20 MG capsule take 1 capsule  by mouth every morning  30 capsule  2  . furosemide (LASIX) 40 MG tablet Take 1 tablet (40 mg total) by mouth 2 (two) times daily.  60 tablet  3  . gabapentin (NEURONTIN) 100 MG capsule take 1 capsule by mouth twice a day  60 capsule  6  . HYDROcodone-acetaminophen (NORCO) 10-325 MG per tablet Take 4 tablets by mouth every 4 (four) hours as needed for moderate pain.       Marland Kitchen LANOXIN 125 MCG tablet       . LYRICA 150 MG capsule take 1 capsule by mouth three times a day  90 capsule  5  . metoCLOPramide (REGLAN) 10 MG tablet Take 1/2 tablet by mouth before meals and at bedtime  180 tablet  1  . Tadalafil, PAH, 20 MG TABS Take 2 tablets (40 mg total) by mouth daily.  60 tablet  0  . aspirin EC 81 MG EC tablet Take 1 tablet (81 mg total) by mouth daily.  30 tablet  3  . esomeprazole (NEXIUM) 40 MG capsule Take by mouth 2 (two) times daily.        Marland Kitchen glucose blood test strip Check Blood Glucose 2-3 times a day.Dx Code:250.60.  100 each  3  . ONE TOUCH LANCETS MISC Check Blood  Glucose 2-3 times a day.Dx code: 250.60.  200 each  3   No current facility-administered medications for this visit.   Family History  Problem Relation Age of Onset  . Heart disease Mother   . Diabetes Mother   . Diabetes Sister    History   Social History  . Marital Status: Divorced    Spouse Name: N/A    Number of Children: 2  . Years of Education: 11   Occupational History  . Umemployed    Social History Main Topics  . Smoking status: Never Smoker   . Smokeless tobacco: Never Used  . Alcohol Use: No  . Drug Use: No  . Sexual Activity: None   Other Topics Concern  . None   Social History Narrative    FAMILY HISTORY:  Significant for coronary artery disease and diabetes   Review of Systems: Pertinent items are noted in HPI. Objective:  Physical Exam: Filed Vitals:   06/27/13 0822  BP: 86/58  Pulse: 67  Temp: 97.7 F (36.5 C)  Height: 5' 4" (1.626 m)  Weight: 154 lb 9.6 oz (70.126 kg)  SpO2: 97%     Physical Exam  Constitutional: She is oriented to person, place, and time. She appears well-developed and well-nourished. No distress.  Cardiovascular: Normal rate, regular rhythm, normal heart sounds and intact distal pulses.  Exam reveals no friction rub.   No murmur heard. Pulmonary/Chest: Effort normal and breath sounds normal. No respiratory distress. She has no rales.  Musculoskeletal: She exhibits no edema and no tenderness.  Neurological: She is alert and oriented to person, place, and time.  Skin: She is not diaphoretic.  Psychiatric:  Distant during conversation.    Assessment & Plan:

## 2013-06-27 NOTE — Assessment & Plan Note (Signed)
This appears to likely be resolved. Will check labs today.

## 2013-06-27 NOTE — Assessment & Plan Note (Signed)
Resolved

## 2013-06-27 NOTE — Assessment & Plan Note (Signed)
The patients acute heart failure exacerbation seems to have resolved. She appear euvolemic with SOB at baseline. Patient has followup with pulm in 1 week for PAH. Plan to continue current management at this time.

## 2013-06-27 NOTE — Assessment & Plan Note (Signed)
The patient reports compliance with Tracleer. Her PAH appears to be stable at this time. She has follow up with Dr. Lamonte Sakai next week.

## 2013-06-27 NOTE — Assessment & Plan Note (Signed)
The patient continues to have poor PO intake of food. She reports eating 1 meal daily (at dinnertime) and 1 snack in the morning. She does not complain to abdominal pain or difficulty eating. She states that she simply does not feel hungry. I encouraged her to have three meals daily. Plan to continue to follow patients PO intake at future visits.

## 2013-06-27 NOTE — Assessment & Plan Note (Signed)
Patient is currently off medication. This is likely due to decreased PO intake of food. Will continue to monitor.

## 2013-06-27 NOTE — Patient Instructions (Signed)
We are checking your blood work today. I will call you with results if they are abnormal. Please follow up with Dr. Lamonte Sakai.  Please follow up with PCP in 2-3 months or sooner if needed.

## 2013-06-28 ENCOUNTER — Telehealth: Payer: Self-pay | Admitting: *Deleted

## 2013-06-28 NOTE — Telephone Encounter (Signed)
Verbal approval for 2 weeks more of PT, in order to prepare for PT discharge, was given, do you agree?

## 2013-06-28 NOTE — Telephone Encounter (Signed)
Yes.

## 2013-06-29 ENCOUNTER — Telehealth: Payer: Self-pay | Admitting: Internal Medicine

## 2013-06-29 ENCOUNTER — Telehealth: Payer: Self-pay | Admitting: Emergency Medicine

## 2013-06-29 NOTE — Telephone Encounter (Signed)
New message     [facility]    Report lb weight gain in 1 day.  Yesterday  147.8 , today  151.2 .   Vs 97., 100/68, 70, 16, 97% on RA ,    Patient taken  Lasix 40 mg twice a day .

## 2013-06-29 NOTE — Telephone Encounter (Signed)
Per RB due to pt's hospital stay--decision made to d/c tracleer and pt is just going to continue with adcirca. Pt is aware of these changes and aware that [name] has been approved.

## 2013-06-29 NOTE — Telephone Encounter (Signed)
Home care nurse reports wt gain 3.4 lbs overnight.  Reports assessment okay.  Pt is not SOB, good breath sounds, ankle measurements are essentially unchanged from last visit.   Ordered lasix 40 mg BID Home nurse questions compliance with BID dosing.   She wonders if patient is only taking daily. Will forward to Dr. Harrington Challenger for advice.

## 2013-07-02 ENCOUNTER — Encounter: Payer: Self-pay | Admitting: Cardiology

## 2013-07-02 NOTE — Telephone Encounter (Signed)
Left detailed message on [name]'s voicemail of labs ordered by dr Harrington Challenger.  She will call back if needed.

## 2013-07-02 NOTE — Telephone Encounter (Signed)
Can home health draw BMET and BNP?

## 2013-07-04 ENCOUNTER — Encounter: Payer: Self-pay | Admitting: Emergency Medicine

## 2013-07-04 ENCOUNTER — Ambulatory Visit (INDEPENDENT_AMBULATORY_CARE_PROVIDER_SITE_OTHER): Payer: Medicare Other | Admitting: Emergency Medicine

## 2013-07-04 VITALS — BP 120/68 | HR 73 | Ht 64.0 in | Wt 157.0 lb

## 2013-07-04 DIAGNOSIS — I2789 Other specified pulmonary heart diseases: Secondary | ICD-10-CM

## 2013-07-04 NOTE — Patient Instructions (Signed)
Please continue your current medications as you are taking them  Follow with Dr Lamonte Sakai in 3 months or sooner if you have any problems.

## 2013-07-04 NOTE — Assessment & Plan Note (Signed)
Much better compensated following admission and diuresis. She is tolerating adcirca. Will consider addition of letaris or tracleer next time.  - rov 3

## 2013-07-04 NOTE — Progress Notes (Signed)
Ms. Sawchuk is a 63 year old woman with scleroderma and severe secondary pulmonary hypertension. She had been treated with bosentan + Coumadin. Initiated [name], Adcirca in the past but unable to tolerate. Finally started on inhaled Tyvaso in 2010. Some delay in titrating up to goal dosing of 9 puffs 4x a day due to compliance issues, but finally able to do so. This was then stopped in Summer 2012 during admission for septic knee hardware.   By TTE, her PASP improved from 102 mmHg (7/09) to 61mHg (9/10), 6 minute walk improved to 3219m11/2010).   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 That hospitalization ultimately resulted in ICU stay for sepsis, c/b renal insufficiency. TTE on June 29 showed estimated RVSP of 63 mmHg (off Tyvaso). We sent her home on bosentan + coumadin, with plans to restart Tyvaso as outpt once she stabilized. She went home on IV Abx.   Readmitted 09/2010 with fever and leukocytosis, S Cr 1.8 - 2.0. Pulm/CCM consulted to assist w her management of PAH medications.  ROV 10/29/10 -- scleroderma, PAH. Has been hospitalized x3 since our last visit due to infected knee hardware. She is off tyvaso. She is planning to go back for repeat L knee surgery this month. She states that her breathing has been going well. She is on Tracleer. She does not want to restart the Tyvaso at this time. Denies dyspnea, syncope, CP.  She is finished with IV abx.   ROV 02/01/11 -- scleroderma, PAH. Remains on coumadin + bosentan but off Tyvaso as above. Last TTE on June 29 showed estimated RVSP of 63 mmHg (off Tyvaso).  She returns for f/u. Tells me that since last visit she underwent f/u knee sgy to replace hardware. She feels it is doing well, now off abx. She is not interested in going back on Tyvaso. Her breathing  seems to be stable, although she isn't really pushing herself.   ROV 06/21/11 -- scleroderma, PAH. Remains on coumadin + bosentan but off Tyvaso. She clinically feels well, has been able to increase her activity but PAP's have increased by TTE 09/11/10. We have planned for repeat TTE in June 2013. Remains on Pred 78m12md for her scleroderma. She asks today about whether a FXa inhibitor would be possible for her, but I don't know of any [facility]. Due for LFT's, wants to get them at coumadin clinic if possible, she will call to arrange.   ROV 10/04/11 -- scleroderma, PAH. Remains on coumadin + bosentan but off Tyvaso. TTE on 08/18/11 shows PASP rising, now 878m21m(from 63 08/2010; her best was 42 in 11/2008). Clinically, she feels well - no breathing complaints. Her wt has been stable, trace edema without change. She ambulates and does her usual activities, denies dyspnea.   ROV 12/13/11 -- scleroderma, PAH. Remains on coumadin + bosentan but off Tyvaso. TTE on 08/18/11 shows PASP rising, now 878mm70mfrom 63 08/2010; her best was 42 in 11/2008). Returns today to discuss Tyvaso - She is still against doing it due to the fact that it is time consuming, difficult to do 4-6 times a day. She isn't sure that it helps her breathing. Her TTE showed moderate RV dilation 6/13. Denies SOB, edema or CP.   ROV 10/04/12 -- scleroderma, PAH.  Remains on coumadin + bosentan but off Tyvaso. She has not had her TTE. Has not been compliant with INR checks at [facility]. Last LFT in my records 05/2012. She reports that she has been feeling well. No breathing limitations. She is asking to be off of coumadin, off of Tracleer.   ROV 11/10/12 -- scleroderma, PAH. She has been on Tyvaso before, stopped due to difficulty administering. I stopped her Tracleer and coumadin 7/23 because she wouldn't get her labs checked reliably. She returns and states that she misses the Tracleer >> she has felt weaker, stayed in the bed,  more dyspnea, decreased functional capacity.   ROV 01/17/13 -- scleroderma, PAH. She has been on Tyvaso before, stopped due to difficulty administering. I stopped her bosentan due to non-compliance with LFT's. She missed it so I restarted 62.86m bid 8/29. I didn't restart the coumadin. She returns without any LFT since 11/10/12. I discussed macitentan with her today. I have recommended switching since LFT's don't have to be followed.   ROV 02/21/13 -- hx scleroderma, PAH. Has been on and off several PAH meds and also coumadin as detailed above. We started macitentan last time. She is currently taking at night - 149m She was having nausea and diarrhea at first, then this got better when she switched to taking at night. Her breathing is stable, maybe slightly better.  Her PASP on TTE 01/24/13 > stable at 8736m  ROV 05/29/13 -- follows up for scleroderma, PAH. Has been on and off several PAH meds and also coumadin. I stopped macitentan beginning of March due to persistent diarrhea. Since then she has continued to have diarrhea. Her breathing has worsened since we stopped the macitentan and she has more edema. She stopped lisinopril/HCTZ a week ago by PCP due to hyperkalemia. She is due for repeat TTE in June. C diff was negative. She is taking loperamide prn. Still on reglan.    ROV 06/05/13 -- follow up for [age] woman with scleroderma and PAH. Seen last week for diarrhea, abd pain. We stopped her macitentan 05/14/13 concerned that this was a possible side effect, but the sx have persisted. She has also been retaining more fluid, has gained wt. I gave her lasix x 3 days a week ago. Her S Cr has risen to 1.6 from 1.3.  We stopped reglan last visit. She has still had frequent diarrhea, nausea. Her dyspnea has worsened. The loperamide has helped some.   ROV 06/14/13 -- follow up for [age] woman with scleroderma and PAH. She was hospitalized in late March for decompensated cor pulmonale, diuresed. She feels  significantly better, has lost 40+ lbs with diuresis. In the hospital the Tracleer was stopped and she was started on Adcirca. She feels better, less swelling.     Exam Filed Vitals:   07/04/13 1151  BP: 120/68  Pulse: 73  Height: _0  (1.626 m)  Weight: 157 lb (71.215 kg)  SpO2: 96%   Gen: Pleasant, well-nourished, in no distress,    ENT: No lesions,  mouth clear,  oropharynx clear, no postnasal drip  Neck: No JVD, no TMG, no carotid bruits  Lungs: No use of accessory muscles, no dullness to percussion, clear without rales or rhonchi  Cardiovascular: RRR, heart sounds normal, no murmur or gallops, no peripheral edema  Musculoskeletal: No deformities, NO pretibial edema (much improved).   Neuro: alert, non focal  Skin: Warm, no lesions or rashes   06/08/13 --  Study Conclusions  - Left  ventricle: Flattening of the septum. The cavity size was normal. Wall thickness was increased in a pattern of mild LVH. The estimated ejection fraction was 50%. - Left atrium: The atrium was mildly dilated. - Right ventricle: The cavity size was moderately to severely dilated. Systolic function was moderately to severely reduced. - Right atrium: The atrium was severely dilated. - Tricuspid valve: Moderate-severe regurgitation. - Pulmonary arteries: PA peak pressure: 17m Hg (S). - Pericardium, extracardiac: A small pericardial effusion was identified posterior to the heart.      01/24/13 --  TTE Study Conclusions  - Left ventricle: The cavity size was normal. There was mild concentric hypertrophy. Systolic function was normal. The estimated ejection fraction was in the range of 60% to 65%. Wall motion was normal; there were no regional wall motion abnormalities. Left ventricular diastolic function parameters were normal. - Ventricular septum: The contour showed diastolic flattening and systolic flattening consistent with volume and pressure overload. - Right ventricle: The  cavity size was severely dilated. There was moderate hypertrophy. Systolic function was moderately reduced. The estimated peak pressure was 843mHg. - Right atrium: There is significant bowing of the interatrial septum to the left consistent with severely elavated right sided pressures. The atrium was massively dilated. - Tricuspid valve: Moderate regurgitation. - Pulmonary arteries: Systolic pressure was severely increased. PA peak pressure: 8756mg (S). (Stable from 1 year ago)    Impression:  PULMONARY HYPERTENSION, SECONDARY Much better compensated following admission and diuresis. She is tolerating adcirca. Will consider addition of letaris or tracleer next time.  - rov 3

## 2013-07-05 NOTE — Progress Notes (Signed)
Case discussed with Dr. Margart Sickles at the time of the visit.  We reviewed the resident's history and exam and pertinent patient test results.  I agree with the assessment, diagnosis, and plan of care documented in the resident's note.

## 2013-07-10 ENCOUNTER — Telehealth: Payer: Self-pay | Admitting: *Deleted

## 2013-07-10 NOTE — Telephone Encounter (Signed)
[facility] nurse [phone]  and left message about pain in arm. Called pt and pt states she had sharp pain right arm last night and unable to move arm.

## 2013-07-11 ENCOUNTER — Ambulatory Visit: Payer: Medicare Other | Admitting: Internal Medicine

## 2013-07-17 NOTE — Telephone Encounter (Signed)
Appt 07/10/13 was no show with Dr Eyvonne Mechanic.

## 2013-07-19 ENCOUNTER — Other Ambulatory Visit: Payer: Self-pay | Admitting: Internal Medicine

## 2013-07-25 ENCOUNTER — Telehealth: Payer: Self-pay | Admitting: Internal Medicine

## 2013-07-25 NOTE — Telephone Encounter (Signed)
Spoke with vanessa, verbal order give to cont. Follow up scheduled

## 2013-07-25 NOTE — Telephone Encounter (Signed)
New message     Need verbal order to continue care for another 2-3 weeks.

## 2013-07-26 ENCOUNTER — Telehealth: Payer: Self-pay | Admitting: *Deleted

## 2013-07-26 NOTE — Telephone Encounter (Signed)
Thanks

## 2013-07-26 NOTE — Telephone Encounter (Signed)
Pt called/informed labs stable per Dr Lynnae January and to keep appt w/Dr Daryll Drown in June - pt voiced understanding. Office visit/labs to be scanned.

## 2013-07-26 NOTE — Telephone Encounter (Signed)
HgB, Cr, and Alk Phos are abnl but stable for pt. She can F/U 6/10 appt with Dr Daryll Drown

## 2013-07-26 NOTE — Telephone Encounter (Signed)
Call from pt - stated she had recent labs at [facility] and was informed of abnormal BUN/Crea levels. I called their office - they will faxed over recent office note/lab result.

## 2013-08-01 ENCOUNTER — Telehealth: Payer: Self-pay | Admitting: *Deleted

## 2013-08-01 NOTE — Telephone Encounter (Signed)
[facility] calls and states that pt is concerned about her weight 5/12 to 5/20 Started at 150.4 to 155.00 tues 158.4 Today 155.4 So she gained then lost some Is there any other way you would want to manage this? This is per [facility]

## 2013-08-01 NOTE — Telephone Encounter (Signed)
I think she should come into the clinic for follow up so we can clinically assess her. Thank you

## 2013-08-02 ENCOUNTER — Ambulatory Visit: Payer: Medicare Other | Admitting: Internal Medicine

## 2013-08-03 ENCOUNTER — Ambulatory Visit (INDEPENDENT_AMBULATORY_CARE_PROVIDER_SITE_OTHER): Payer: Medicare Other | Admitting: Internal Medicine

## 2013-08-03 ENCOUNTER — Ambulatory Visit (HOSPITAL_COMMUNITY)
Admission: RE | Admit: 2013-08-03 | Discharge: 2013-08-03 | Disposition: A | Discharge status: Home / Self Care | Payer: Medicare Other | Source: Ambulatory Visit | Attending: Internal Medicine | Admitting: Internal Medicine

## 2013-08-03 ENCOUNTER — Other Ambulatory Visit: Payer: Self-pay | Admitting: Internal Medicine

## 2013-08-03 ENCOUNTER — Encounter (HOSPITAL_COMMUNITY): Payer: Self-pay | Admitting: General Practice

## 2013-08-03 ENCOUNTER — Encounter: Payer: Self-pay | Admitting: Internal Medicine

## 2013-08-03 ENCOUNTER — Inpatient Hospital Stay (HOSPITAL_COMMUNITY)
Admission: AD | Admit: 2013-08-03 | Discharge: 2013-08-04 | DRG: 682 | Disposition: A | Discharge status: Home / Self Care | Payer: Medicare Other | Source: Ambulatory Visit | Attending: Internal Medicine | Admitting: Internal Medicine

## 2013-08-03 VITALS — BP 90/64 | HR 64 | Temp 97.0°F | Ht 64.0 in | Wt 157.6 lb

## 2013-08-03 DIAGNOSIS — I2789 Other specified pulmonary heart diseases: Secondary | ICD-10-CM | POA: Diagnosis present

## 2013-08-03 DIAGNOSIS — E1142 Type 2 diabetes mellitus with diabetic polyneuropathy: Secondary | ICD-10-CM | POA: Diagnosis present

## 2013-08-03 DIAGNOSIS — I5030 Unspecified diastolic (congestive) heart failure: Secondary | ICD-10-CM

## 2013-08-03 DIAGNOSIS — E860 Dehydration: Secondary | ICD-10-CM | POA: Diagnosis present

## 2013-08-03 DIAGNOSIS — R079 Chest pain, unspecified: Secondary | ICD-10-CM | POA: Diagnosis present

## 2013-08-03 DIAGNOSIS — IMO0002 Reserved for concepts with insufficient information to code with codable children: Secondary | ICD-10-CM | POA: Diagnosis present

## 2013-08-03 DIAGNOSIS — E869 Volume depletion, unspecified: Secondary | ICD-10-CM

## 2013-08-03 DIAGNOSIS — Z8249 Family history of ischemic heart disease and other diseases of the circulatory system: Secondary | ICD-10-CM

## 2013-08-03 DIAGNOSIS — I959 Hypotension, unspecified: Secondary | ICD-10-CM

## 2013-08-03 DIAGNOSIS — K3184 Gastroparesis: Secondary | ICD-10-CM | POA: Diagnosis present

## 2013-08-03 DIAGNOSIS — I129 Hypertensive chronic kidney disease with stage 1 through stage 4 chronic kidney disease, or unspecified chronic kidney disease: Secondary | ICD-10-CM | POA: Diagnosis present

## 2013-08-03 DIAGNOSIS — R112 Nausea with vomiting, unspecified: Secondary | ICD-10-CM | POA: Diagnosis present

## 2013-08-03 DIAGNOSIS — Z96659 Presence of unspecified artificial knee joint: Secondary | ICD-10-CM

## 2013-08-03 DIAGNOSIS — I509 Heart failure, unspecified: Secondary | ICD-10-CM | POA: Diagnosis present

## 2013-08-03 DIAGNOSIS — Z79899 Other long term (current) drug therapy: Secondary | ICD-10-CM

## 2013-08-03 DIAGNOSIS — N189 Chronic kidney disease, unspecified: Secondary | ICD-10-CM | POA: Diagnosis present

## 2013-08-03 DIAGNOSIS — E119 Type 2 diabetes mellitus without complications: Secondary | ICD-10-CM

## 2013-08-03 DIAGNOSIS — D573 Sickle-cell trait: Secondary | ICD-10-CM | POA: Diagnosis present

## 2013-08-03 DIAGNOSIS — E1149 Type 2 diabetes mellitus with other diabetic neurological complication: Secondary | ICD-10-CM | POA: Diagnosis present

## 2013-08-03 DIAGNOSIS — Z833 Family history of diabetes mellitus: Secondary | ICD-10-CM

## 2013-08-03 DIAGNOSIS — N179 Acute kidney failure, unspecified: Principal | ICD-10-CM | POA: Diagnosis present

## 2013-08-03 DIAGNOSIS — M349 Systemic sclerosis, unspecified: Secondary | ICD-10-CM | POA: Diagnosis present

## 2013-08-03 DIAGNOSIS — K219 Gastro-esophageal reflux disease without esophagitis: Secondary | ICD-10-CM | POA: Diagnosis present

## 2013-08-03 DIAGNOSIS — I5031 Acute diastolic (congestive) heart failure: Secondary | ICD-10-CM | POA: Diagnosis present

## 2013-08-03 HISTORY — DX: Type 2 diabetes mellitus without complications: E11.9

## 2013-08-03 HISTORY — DX: Chronic kidney disease, unspecified: N18.9

## 2013-08-03 LAB — CBC WITH DIFFERENTIAL/PLATELET
Basophils Absolute: 0 10*3/uL (ref 0.0–0.1)
Basophils Relative: 0 % (ref 0–1)
Eosinophils Absolute: 0.1 10*3/uL (ref 0.0–0.7)
Eosinophils Relative: 2 % (ref 0–5)
HCT: 31.4 % — ABNORMAL LOW (ref 36.0–46.0)
Hemoglobin: 10.4 g/dL — ABNORMAL LOW (ref 12.0–15.0)
LYMPHS ABS: 1.8 10*3/uL (ref 0.7–4.0)
Lymphocytes Relative: 36 % (ref 12–46)
MCH: 27.2 pg (ref 26.0–34.0)
MCHC: 33.1 g/dL (ref 30.0–36.0)
MCV: 82.2 fL (ref 78.0–100.0)
Monocytes Absolute: 0.5 10*3/uL (ref 0.1–1.0)
Monocytes Relative: 10 % (ref 3–12)
NEUTROS PCT: 52 % (ref 43–77)
Neutro Abs: 2.7 10*3/uL (ref 1.7–7.7)
PLATELETS: 185 10*3/uL (ref 150–400)
RBC: 3.82 MIL/uL — AB (ref 3.87–5.11)
RDW: 15 % (ref 11.5–15.5)
WBC: 5.1 10*3/uL (ref 4.0–10.5)

## 2013-08-03 LAB — URINALYSIS, ROUTINE W REFLEX MICROSCOPIC
Bilirubin Urine: NEGATIVE
Glucose, UA: NEGATIVE mg/dL
Hgb urine dipstick: NEGATIVE
KETONES UR: NEGATIVE mg/dL
NITRITE: NEGATIVE
PROTEIN: NEGATIVE mg/dL
Specific Gravity, Urine: 1.01 (ref 1.005–1.030)
UROBILINOGEN UA: 0.2 mg/dL (ref 0.0–1.0)
pH: 6 (ref 5.0–8.0)

## 2013-08-03 LAB — COMPLETE METABOLIC PANEL WITH GFR
ALBUMIN: 3.7 g/dL (ref 3.5–5.2)
ALT: 8 U/L (ref 0–35)
AST: 20 U/L (ref 0–37)
Alkaline Phosphatase: 145 U/L — ABNORMAL HIGH (ref 39–117)
BUN: 40 mg/dL — AB (ref 6–23)
CHLORIDE: 104 meq/L (ref 96–112)
CO2: 24 meq/L (ref 19–32)
Calcium: 9.7 mg/dL (ref 8.4–10.5)
Creat: 1.41 mg/dL — ABNORMAL HIGH (ref 0.50–1.10)
GFR, EST AFRICAN AMERICAN: 46 mL/min — AB
GFR, Est Non African American: 40 mL/min — ABNORMAL LOW
Glucose, Bld: 91 mg/dL (ref 70–99)
POTASSIUM: 4 meq/L (ref 3.5–5.3)
Sodium: 143 mEq/L (ref 135–145)
Total Bilirubin: 0.3 mg/dL (ref 0.3–1.2)
Total Protein: 8.6 g/dL — ABNORMAL HIGH (ref 6.0–8.3)

## 2013-08-03 LAB — TROPONIN I
Troponin I: <0.3 ng/mL (ref <0.30)
Troponin I: <0.3 ng/mL (ref <0.30)

## 2013-08-03 LAB — GLUCOSE, CAPILLARY
GLUCOSE-CAPILLARY: 108 mg/dL — AB (ref 70–99)
Glucose-Capillary: 93 mg/dL (ref 70–99)

## 2013-08-03 LAB — URINALYSIS, MICROSCOPIC ONLY

## 2013-08-03 MED ORDER — SODIUM CHLORIDE 0.9 % IV SOLN
INTRAVENOUS | Status: AC
  Administered 2013-08-03: 19:00:00 via INTRAVENOUS

## 2013-08-03 MED ORDER — ASPIRIN EC 81 MG PO TBEC
81.0000 mg | DELAYED_RELEASE_TABLET | Freq: Every day | ORAL | Status: DC
  Administered 2013-08-04: 81 mg via ORAL
  Filled 2013-08-03 (×3): qty 1

## 2013-08-03 MED ORDER — METOCLOPRAMIDE HCL 5 MG PO TABS
5.0000 mg | ORAL_TABLET | Freq: Three times a day (TID) | ORAL | Status: DC
  Administered 2013-08-03 – 2013-08-04 (×2): 5 mg via ORAL
  Filled 2013-08-03 (×6): qty 1

## 2013-08-03 MED ORDER — PREGABALIN 50 MG PO CAPS
150.0000 mg | ORAL_CAPSULE | Freq: Three times a day (TID) | ORAL | Status: DC
  Administered 2013-08-03 – 2013-08-04 (×3): 150 mg via ORAL
  Filled 2013-08-03 (×3): qty 3

## 2013-08-03 MED ORDER — SODIUM CHLORIDE 0.9 % IJ SOLN
3.0000 mL | Freq: Two times a day (BID) | INTRAMUSCULAR | Status: DC
  Administered 2013-08-04: 3 mL via INTRAVENOUS

## 2013-08-03 MED ORDER — TADALAFIL (PAH) 20 MG PO TABS
40.0000 mg | ORAL_TABLET | Freq: Every day | ORAL | Status: DC
  Administered 2013-08-03 – 2013-08-04 (×2): 40 mg via ORAL
  Filled 2013-08-03 (×2): qty 2

## 2013-08-03 MED ORDER — ACETAMINOPHEN 500 MG PO TABS
500.0000 mg | ORAL_TABLET | ORAL | Status: DC | PRN
  Administered 2013-08-03: 500 mg via ORAL
  Filled 2013-08-03 (×2): qty 1

## 2013-08-03 MED ORDER — ENOXAPARIN SODIUM 40 MG/0.4ML ~~LOC~~ SOLN
40.0000 mg | SUBCUTANEOUS | Status: DC
Start: 2013-08-03 — End: 2013-08-04
  Administered 2013-08-03: 40 mg via SUBCUTANEOUS
  Filled 2013-08-03 (×2): qty 0.4

## 2013-08-03 MED ORDER — FLUOXETINE HCL 20 MG PO CAPS
20.0000 mg | ORAL_CAPSULE | Freq: Every day | ORAL | Status: DC
  Administered 2013-08-03 – 2013-08-04 (×2): 20 mg via ORAL
  Filled 2013-08-03 (×2): qty 1

## 2013-08-03 MED ORDER — FUROSEMIDE 40 MG PO TABS
40.0000 mg | ORAL_TABLET | Freq: Two times a day (BID) | ORAL | Status: DC
  Filled 2013-08-03 (×2): qty 1

## 2013-08-03 MED ORDER — HYDROCODONE-ACETAMINOPHEN 5-325 MG PO TABS
1.0000 | ORAL_TABLET | Freq: Once | ORAL | Status: AC
  Administered 2013-08-03: 1 via ORAL
  Filled 2013-08-03: qty 1

## 2013-08-03 MED ORDER — GABAPENTIN 100 MG PO CAPS
100.0000 mg | ORAL_CAPSULE | Freq: Two times a day (BID) | ORAL | Status: DC
  Administered 2013-08-03 – 2013-08-04 (×2): 100 mg via ORAL
  Filled 2013-08-03 (×3): qty 1

## 2013-08-03 MED ORDER — SODIUM CHLORIDE 0.9 % IV SOLN: INTRAVENOUS | Status: DC

## 2013-08-03 MED ORDER — PANTOPRAZOLE SODIUM 40 MG IV SOLR
40.0000 mg | INTRAVENOUS | Status: DC
  Administered 2013-08-03: 40 mg via INTRAVENOUS
  Filled 2013-08-03 (×2): qty 40

## 2013-08-03 MED ORDER — PROMETHAZINE HCL 25 MG PO TABS: 12.5000 mg | ORAL_TABLET | Freq: Four times a day (QID) | ORAL | Status: DC | PRN

## 2013-08-03 MED ORDER — INSULIN ASPART 100 UNIT/ML ~~LOC~~ SOLN: 0.0000 [IU] | Freq: Three times a day (TID) | SUBCUTANEOUS | Status: DC

## 2013-08-03 NOTE — H&P (Signed)
Date: 08/03/2013               Patient Name:  [name] MRN: [id number]  DOB: Mar 24, 1950 Age / Sex: 63 y.o., female   PCP: Sid Falcon, MD           Medical Service: Internal Medicine Teaching Service         Attending Physician: Dr. Axel Filler, MD    First Contact: Dr. Michail Jewels, MD Pager: [phone] (7AM-5PM Mon-Fri)  Second Contact: Dr. Karlyn Agee, MD Pager: [phone]       After Hours (After 5p/  First Contact Pager: [phone]  weekends / holidays): Second Contact Pager: [phone]    Most Recent Discharge Date:  06/14/13  Chief Complaint: No chief complaint on file.      History of Present Illness:  [name] is a 63 y.o. female who has a PMH of pulmonary hypertension secondary to scleroderma, DMII, GERD/gastritis, hypertension, sickle cell trait, diastolic dysfunction.  Pt presents to the [facility] with concerns of weight gain of 3lbs over one day.  She has pulmonary hypertension and heart failure and weighs herself on the same scales each day and states that her weight increased 3 lbs overnight.  She reports compliance with all her medications and denies missing any doses.  She also states that she was given a medicine for her pulmonary HTN that has caused her significant diarrhea over the past few months.  Additionally, she reports eating some ice cream at [facility] on Tuesday and having 3 episodes of N/V (non-bloody, non-bilious) on Tuesday.  Otherwise she has no acute complaints.  She reports SOB with some CP that is a results of her pulmonary HTN but states that is chronic.  She reports associated dizziness, lightheadedness, and HA. She denies any fever/chils, palpitations, urinary or bowel symptoms.  No sick contacts.   EKG today shows asymmetric t wave inversions in precordial leads with poor R wave progression, probable right axis deviation, and right ventricular hypertrophy.    Meds: Current Facility-Administered Medications  Medication Dose Route Frequency  Provider Last Rate Last Dose  . 0.9 %  sodium chloride infusion   Intravenous Continuous Cresenciano Genre, MD 75 mL/hr at 08/03/13 1850    . acetaminophen (TYLENOL) tablet 500 mg  500 mg Oral Q4H PRN Cresenciano Genre, MD   500 mg at 08/03/13 1847  . aspirin EC tablet 81 mg  81 mg Oral Daily Cresenciano Genre, MD      . enoxaparin (LOVENOX) injection 40 mg  40 mg Subcutaneous Q24H Cresenciano Genre, MD   40 mg at 08/03/13 1848  . FLUoxetine (PROZAC) capsule 20 mg  20 mg Oral Daily Cresenciano Genre, MD      . gabapentin (NEURONTIN) capsule 100 mg  100 mg Oral BID Cresenciano Genre, MD      . Derrill Memo ON 08/04/2013] insulin aspart (novoLOG) injection 0-9 Units  0-9 Units Subcutaneous TID WC Cresenciano Genre, MD      . metoCLOPramide (REGLAN) tablet 5 mg  5 mg Oral TID AC & HS Cresenciano Genre, MD      . pantoprazole (PROTONIX) injection 40 mg  40 mg Intravenous Q24H Cresenciano Genre, MD   40 mg at 08/03/13 1847  . pregabalin (LYRICA) capsule 150 mg  150 mg Oral TID Cresenciano Genre, MD   150 mg at 08/03/13 1847  . promethazine (PHENERGAN) tablet 12.5 mg  12.5 mg Oral Q6H  PRN Cresenciano Genre, MD      . sodium chloride 0.9 % injection 3 mL  3 mL Intravenous Q12H Cresenciano Genre, MD      . Tadalafil (PAH) TABS 40 mg  40 mg Oral Daily Cresenciano Genre, MD   40 mg at 08/03/13 1847    Prescriptions prior to admission  Medication Sig Dispense Refill  . Multiple Vitamins-Minerals (MULTIVITAMIN WITH MINERALS) tablet Take 1 tablet by mouth daily.      Marland Kitchen aspirin EC 81 MG EC tablet Take 1 tablet (81 mg total) by mouth daily.  30 tablet  3  . esomeprazole (NEXIUM) 40 MG capsule Take by mouth 2 (two) times daily.        . fenofibrate (TRICOR) 48 MG tablet take 1 tablet by mouth once daily  30 tablet  11  . FLUoxetine (PROZAC) 20 MG capsule take 1 capsule by mouth every morning  30 capsule  2  . furosemide (LASIX) 40 MG tablet Take 1 tablet (40 mg total) by mouth 2 (two) times daily.  60 tablet  3  . gabapentin (NEURONTIN) 100 MG capsule  take 1 capsule by mouth twice a day  60 capsule  6  . glucose blood test strip Check Blood Glucose 2-3 times a day.Dx Code:250.60.  100 each  3  . HYDROcodone-acetaminophen (NORCO) 10-325 MG per tablet Take 4 tablets by mouth every 4 (four) hours as needed for moderate pain.       Marland Kitchen LANOXIN 125 MCG tablet       . LYRICA 150 MG capsule take 1 capsule by mouth three times a day  90 capsule  5  . metoCLOPramide (REGLAN) 10 MG tablet Take 1/2 tablet by mouth before meals and at bedtime  180 tablet  1  . ONE TOUCH LANCETS MISC Check Blood Glucose 2-3 times a day.Dx code: 250.60.  200 each  3  . Tadalafil, PAH, 20 MG TABS Take 2 tablets (40 mg total) by mouth daily.  60 tablet  0    Allergies: Allergies as of 08/03/2013 - Review Complete 08/03/2013  Allergen Reaction Noted  . Codeine    . Contrast media [iodinated diagnostic agents] Hives 08/03/2011  . Iohexol  03/04/2008  . Cephalexin Rash 05/01/2010  . Ciprofloxacin Rash 07/23/2010    PMH: Past Medical History  Diagnosis Date  . Secondary pulmonary hypertension     right heart cath 04/20/04  . Cough   . Allergic rhinitis, cause unspecified   . Type II or unspecified type diabetes mellitus without mention of complication, not stated as uncontrolled   . Degeneration of lumbar or lumbosacral intervertebral disc   . Esophageal reflux   . Systemic sclerosis   . Unspecified essential hypertension   . Rotator cuff tear   . Gastritis   . Sickle cell trait   . Arthritis   . Obesity   . Visual changes   . Scleroderma   . Diastolic dysfunction   . Trichomonas   . Vaginal bleeding   . Neuropathy   . CHF (congestive heart failure)     PSH: Past Surgical History  Procedure Laterality Date  . Partial hysterectomy  1983  . Tubal ligation    . Tonsillectomy    . Upper endoscopy with biopsy  03/10/2006  . Right shoulder arthroscopy, subacromial decompression    . Cardiac catheterization  04/24/2004  . Replacement total knee  11/2000     FH: Family History  Problem Relation Age of  Onset  . Heart disease Mother   . Diabetes Mother   . Diabetes Sister     SH: History  Substance Use Topics  . Smoking status: Never Smoker   . Smokeless tobacco: Never Used  . Alcohol Use: No    Review of Systems: Pertinent items are noted in HPI.  Physical Exam: Filed Vitals:   08/03/13 2106  BP: 98/60  Pulse: 67  Temp: 98.5 F (36.9 C)  Resp: 18   Physical Exam Constitutional: Vital signs reviewed.  Patient is an elderly female lying in bed in no acute distress and cooperative with exam.   Head: Normocephalic and atraumatic Eyes: PERRL, EOMI, conjunctivae normal, no scleral icterus.  Neck: Supple, trachea midline .  Cardiovascular: bradycardic, regular rhythm, no MRG, pulses symmetric and intact bilaterally Pulmonary/Chest: normal respiratory effort, CTAB, no wheezes, rales, or rhonchi Abdominal: Soft. Mild epigastric tenderness to palpation, non-distended, bowel sounds are normal, no masses, organomegaly, or guarding present.  Musculoskeletal: No joint deformities, erythema, or stiffness noted Neurological: A&O x3, cranial nerve II-XII are grossly intact, no focal motor deficit, sensory intact to light touch bilaterally.  Skin: Warm, dry and intact. No rash, cyanosis, or clubbing.  Psychiatric: Normal mood and affect.    Lab results:  Basic Metabolic Panel:  Recent Labs  08/03/13 1120  NA 143  K 4.0  CL 104  CO2 24  GLUCOSE 91  BUN 40*  CREATININE 1.41*  CALCIUM 9.7   Anion Gap: 15  Cacium/Magnesium/Phosphorus:  Recent Labs Lab 08/03/13 1120  CALCIUM 9.7    Liver Function Tests:  Recent Labs  08/03/13 1120  AST 20  ALT 8  ALKPHOS 145*  BILITOT 0.3  PROT 8.6*  ALBUMIN 3.7   No results found for this basename: LIPASE, AMYLASE,  in the last 72 hours No results found for this basename: AMMONIA,  in the last 72 hours  CBC: Lab Results  Component Value Date   WBC 5.1 08/03/2013   HGB  10.4* 08/03/2013   HCT 31.4* 08/03/2013   MCV 82.2 08/03/2013   PLT 185 08/03/2013    Lipase: Lab Results  Component Value Date   LIPASE 15 06/07/2013    Lactic Acid/Procalcitonin: No results found for this basename: LATICACIDVEN, PROCALCITON, O2SATVEN,  in the last 168 hours  Cardiac Enzymes: Lab Results  Component Value Date   CKTOTAL 6373* 09/11/2010   CKMB 21.7* 09/11/2010   TROPONINI <0.30 08/03/2013    BNP: No results found for this basename: PROBNP,  in the last 72 hours  D-Dimer: No results found for this basename: DDIMER,  in the last 72 hours  CBG:  Recent Labs  08/03/13 1033 08/03/13 2134  GLUCAP 93 108*    Hemoglobin A1C: No results found for this basename: HGBA1C,  in the last 72 hours  Lipid Panel: No results found for this basename: CHOL, HDL, LDLCALC, TRIG, CHOLHDL, LDLDIRECT,  in the last 72 hours  Thyroid Function Tests: No results found for this basename: TSH, T4TOTAL, FREET4, T3FREE, THYROIDAB,  in the last 72 hours  Anemia Panel: No results found for this basename: VITAMINB12, FOLATE, FERRITIN, TIBC, IRON, RETICCTPCT,  in the last 72 hours  Coagulation: No results found for this basename: LABPROT, INR,  in the last 72 hours  Urine Drug Screen: Drugs of Abuse:     Component Value Date/Time   LABOPIA POSITIVE* 10/02/2010 [place] 10/02/2010 1128   LABBENZ NONE DETECTED 10/02/2010 1128   AMPHETMU NONE DETECTED 10/02/2010  [place] 10/02/2010 [place] 10/02/2010 1128    Alcohol Level: No results found for this basename: ETH,  in the last 72 hours  Urinalysis:    Component Value Date/Time   COLORURINE YELLOW 08/03/2013 1200   APPEARANCEUR CLEAR 08/03/2013 1200   LABSPEC 1.010 08/03/2013 1200   PHURINE 6.0 08/03/2013 1200   GLUCOSEU NEG 08/03/2013 1200   HGBUR NEGATIVE 08/03/2013 1200   BILIRUBINUR NEGATIVE 08/03/2013 1200   KETONESUR NEGATIVE 08/03/2013 1200   PROTEINUR NEGATIVE  08/03/2013 1200   UROBILINOGEN 0.2 08/03/2013 1200   NITRITE NEG 08/03/2013 1200   LEUKOCYTESUR SMALL* 08/03/2013 1200    Imaging results:  No results found.  EKG: EKG Interpretation  Date/Time:    Ventricular Rate:    PR Interval:    QRS Duration:   QT Interval:    QTC Calculation:   R Axis:     Text Interpretation:     Orders placed during the hospital encounter of 08/03/13  . EKG 12-LEAD     Antibiotics: Antibiotics Given (last 72 hours)   None      Anti-infectives   None      SIRS/Sepsis/Septic Shock criteria met:  No  Consults:    Assessment & Plan by Problem: Active Problems:   Hypotension  [name] is a 63yo female who has a PMH of pulmonary hypertension secondary to scleroderma, DMII, GERD/gastritis, hypertension, sickle cell trait, diastolic dysfunction.   Hypotension Presents with BP of 98/60 and appears that her BP has been this low previously on OV 06/27/13 SPB was 86.  She is slightly asymptomatic with HA and dizziness.  Also reports on previous visits that she continues with a low po intake eating a snack in the morning and then eats supper.  She does not eat lunch or dinner.  Hypotension likely multifactorial due to decreased po intake, N/V while continuing lasix, lanoxin.  Hemoglobin stable but chronically low at 10.4.  Also taking norco which could be contributing.  No reason to suspect pericardial effusion.  TSH slightly elevated on 3/26 at 4.523 with normal FT4.   -admit to obs telemetry -NS at 22m/h -hold BP meds -check digoxin level -trops x 3   Diastolic dysfunction Does not appear hypervolemic on exam without JVD, crackles, LE edema, ascites.  06/09/13 ProBNP is 2707-->down from previous value of 3360 on 3/26.  Previous TTE reveals an LVEF of 50% with PAP of 93mg.    -avoid NSAIDS -hold home meds  -I/Os, daily weights -O2 PRN  AKI on Chronic Kidney Disease   Baseline creatinine ~1.5.  Most likely etiology prerenal given BUN/Cr  ratio>20,  in the setting of volume depletion from decreased po intake, diuretic use, and vomiting/diarrhea. UA indicates isosthenuria (likely from sickle cell trait?).  Other etiologies include intrarenal or postrenal. No complications including volume overload, metabolic acidosis, hyperkalemia, hyperphosphatemia, hypocalcemia. Lab Results  Component Value Date   CREATININE 1.41* 08/03/2013  -avoid nephrotoxic agents  h/o Hypertension  Pt presents with hypotension.  -hold home meds  Controlled Diabetes Mellitus II  Lab Results  Component Value Date   HGBA1C 5.3 06/07/2013  -ac and hs cbg  Gastroparesis -continue reglan  GERD  Pt on chronic PPI at home -continue home meds  FEN  Fluids-NS 7555m Electrolytes- Nutrition- Heart healthy  VTE prophylaxis  5000 Units Heparin SQ tid  Disposition Disposition deferred at this time, awaiting improvement of current medical problems. Anticipated discharge in approximately 1-2 day(s).  Emergency Contact Contact Information   Name Relation Home Work [place] B Sister [phone]     Alexis, Reber [phone]        The patient does have a current PCP Sid Falcon, MD) and does need an [facility] hospital follow-up appointment after discharge.  Signed Jones Bales, MD PGY-1, Internal Medicine Teaching Service [phone] (7AM-5PM Mon-Fri) 08/03/2013, 9:52 PM

## 2013-08-03 NOTE — H&P (Signed)
Date: 08/03/2013               Patient Name:  [name] MRN: [id number]  DOB: [birth year] Age / Sex: 63 y.o., female   PCP: Sid Falcon, MD              Medical Service: Internal Medicine Teaching Service              Attending Physician: Dr. Axel Filler, MD    First Contact:  MS Lorella Nimrod Pager: [phone]  Second Contact: Dr. Gordy Levan Pager: [phone]  Third Contact Dr. Aundra Dubin Pager: [phone]       After Hours (After 5p/  First Contact Pager: [phone]  weekends / holidays): Second Contact Pager: [phone]   Chief Complaint: Nausea, Vomiting, change in recent wt.  History of Present Illness:[name] is a 63 y.o. with PMH of Pulmonary HTN, essential HTN, Diabetes, gastroparesis, scleroderma,C/O N/V on Tuesday after eating ice cream from [employer], had 3 episodes, mostly consist of previously eaton food, no hematemesis, ass. With stomach cramps. She said she was fine after that although little anorexic but able to tolerate food.She also C/O recent wt. Gain over the period of few days, which was checked by home visiting nurse and send her to clinic for evaluation . Also C/O little dizziness, lightheadedness, mild headache,and imbalance but no fall. H/O SOB on exertion, can walk upto a block before becoming SOB. C/O mild central chest pain which she says started after vomiting, 5/10 in intensity, non radiating, aggravating with palpation, relieved with rest and not touching. No relation with deep breathing. Denies any fever, chills, palpitation , recent change in urinary and bowel habits. Denies any difficulty with swallowing , sore throat, ill contact or URI symptoms.    Meds: Current Facility-Administered Medications  Medication Dose Route Frequency Provider Last Rate Last Dose  . 0.9 %  sodium chloride infusion   Intravenous Continuous Cresenciano Genre, MD      . acetaminophen (TYLENOL) tablet 500 mg  500 mg Oral Q4H PRN Cresenciano Genre, MD      . aspirin EC tablet 81 mg  81  mg Oral Daily Cresenciano Genre, MD      . enoxaparin (LOVENOX) injection 40 mg  40 mg Subcutaneous Q24H Cresenciano Genre, MD      . FLUoxetine (PROZAC) capsule 20 mg  20 mg Oral Daily Cresenciano Genre, MD      . gabapentin (NEURONTIN) capsule 100 mg  100 mg Oral BID Cresenciano Genre, MD      . metoCLOPramide (REGLAN) tablet 5 mg  5 mg Oral TID AC & HS Cresenciano Genre, MD      . pantoprazole (PROTONIX) injection 40 mg  40 mg Intravenous Q24H Cresenciano Genre, MD      . pregabalin (LYRICA) capsule 150 mg  150 mg Oral TID Cresenciano Genre, MD      . promethazine (PHENERGAN) tablet 12.5 mg  12.5 mg Oral Q6H PRN Cresenciano Genre, MD      . sodium chloride 0.9 % injection 3 mL  3 mL Intravenous Q12H Cresenciano Genre, MD      . Tadalafil (PAH) TABS 40 mg  40 mg Oral Daily Cresenciano Genre, MD        Allergies: Allergies as of 08/03/2013 - Review Complete 08/03/2013  Allergen Reaction Noted  . Codeine    . Contrast media [iodinated diagnostic agents] Hives 08/03/2011  .  Iohexol  03/04/2008  . Cephalexin Rash 05/01/2010  . Ciprofloxacin Rash 07/23/2010   Past Medical History  Diagnosis Date  . Secondary pulmonary hypertension     right heart cath 04/20/04  . Cough   . Allergic rhinitis, cause unspecified   . Type II or unspecified type diabetes mellitus without mention of complication, not stated as uncontrolled   . Degeneration of lumbar or lumbosacral intervertebral disc   . Esophageal reflux   . Systemic sclerosis   . Unspecified essential hypertension   . Rotator cuff tear   . Gastritis   . Sickle cell trait   . Arthritis   . Obesity   . Visual changes   . Scleroderma   . Diastolic dysfunction   . Trichomonas   . Vaginal bleeding   . Neuropathy   . CHF (congestive heart failure)    Past Surgical History  Procedure Laterality Date  . Partial hysterectomy  1983  . Tubal ligation    . Tonsillectomy    . Upper endoscopy with biopsy  03/10/2006  . Right shoulder arthroscopy, subacromial  decompression    . Cardiac catheterization  04/24/2004  . Replacement total knee  11/2000   Family History  Problem Relation Age of Onset  . Heart disease Mother   . Diabetes Mother   . Diabetes Sister    History   Social History  . Marital Status: Divorced    Spouse Name: N/A    Number of Children: 2  . Years of Education: 11   Occupational History  . Umemployed    Social History Main Topics  . Smoking status: Never Smoker   . Smokeless tobacco: Never Used  . Alcohol Use: No  . Drug Use: No  . Sexual Activity: Not on file   Other Topics Concern  . Not on file   Social History Narrative    FAMILY HISTORY:  Significant for coronary artery disease and diabetes    Review of Systems: Pertinent items are noted in HPI.  Physical Exam: Blood pressure 98/50, pulse 57, temperature 97 F (36.1 C), temperature source Oral, resp. rate 18, SpO2 100.00%. BP 98/50  Pulse 57  Temp(Src) 97 F (36.1 C) (Oral)  Resp 18  SpO2 100%  General Appearance:    Alert, cooperative, no distress, appears stated age  Head:    Normocephalic, without obvious abnormality, atraumatic     Nose:   Nares normal, septum midline, mucosa normal, no drainage    or sinus tenderness  Throat:   Lips, mucosa, and tongue normal;  Neck:   Supple, symmetrical, trachea midline, no adenopathy;    thyroid:  no enlargement/tenderness/nodules; no carotid   bruit or JVD  Back:     Symmetric, no curvature, ROM normal, no CVA tenderness  Lungs:     Clear to auscultation bilaterally, respirations unlabored  Chest Wall:    Mild upper central and left sided tenderness , no deformity, or erythema   Heart:    Regular rate and rhythm, S1 and S2 normal, no murmur, rub   or gallop     Abdomen:     Soft, non-tender, bowel sounds active all four quadrants,    no masses, no organomegaly     Extremities:   Extremities normal, atraumatic, no cyanosis or edema  Pulses:   2+ and symmetric all extremities  Skin:   Skin  color, texture, turgor normal, no rashes or lesions  Lymph nodes:   Cervical, supraclavicular, and axillary nodes normal  Neurologic:  CNII-XII intact, normal strength, sensation and reflexes    throughout    Lab results: Sodium  142  143     Sodium   Potassium  4.5  4.0     Potassium   Chloride  104  104     Chloride   CO2  24  24     CO2   BUN  48  40     BUN   Creat  1.47  1.41     Creat   Calcium  9.4  9.7     Calcium   Glucose, Bld  68  91     Glucose, Bld   Alkaline Phosphatase    145     Alkaline Phosphatase   Albumin    3.7     Albumin   AST    20     AST   ALT    8     ALT   Total Protein    8.6     Total Protein   Total Bilirubin    0.3     Total Bilirubin   GFR, Est African American  44  46     GFR, Est African American   GFR, Est Non African American  38  40     GFR, Est Non African American   CARDIAC PROFILE    Troponin I    <0.30     Troponin I   CBC    WBC    5.1     WBC   RBC    3.82     RBC   Hemoglobin    10.4     Hemoglobin   HCT    31.4     HCT   MCV    82.2     MCV   MCH    27.2     MCH   MCHC    33.1     MCHC   RDW    15.0     RDW   Platelets    185     Platelets   DIFFERENTIAL    Neutrophils Relative %    52     Neutrophils Relative %   Lymphocytes Relative    36     Lymphocytes Relative   Monocytes Relative    10     Monocytes Relative   Eosinophils Relative    2     Eosinophils Relative   Basophils Relative    0     Basophils Relative   Neutro Abs    2.7     Neutro Abs   Lymphs Abs    1.8     Lymphs Abs   Monocytes Absolute    0.5     Monocytes Absolute   Eosinophils Absolute    0.1     Eosinophils Absolute   Basophils Absolute    0.0     Basophils Absolute   Smear Review    Criteria for review not met     Smear Review   DIABETES    Glucose, Bld  68  91     Glucose, Bld   URINALYSIS    Color, Urine      YELLOW   Color, Urine   APPearance      CLEAR   APPearance   Specific Gravity, Urine      1.010   Specific Gravity, Urine   pH      6.0    pH  Glucose, UA      NEG   Glucose, UA   Bilirubin Urine      NEGATIVE   Bilirubin Urine   Ketones, ur      NEGATIVE   Ketones, ur   Protein, ur      NEGATIVE   Protein, ur   Urobilinogen, UA      0.2   Urobilinogen, UA   Nitrite      NEG   Nitrite   Leukocytes, UA      SMALL   Leukocytes, UA   Hgb urine dipstick      NEGATIVE   Hgb urine dipstick   WBC, UA      3-6   WBC, UA   RBC / HPF      0-2   RBC / HPF   Squamous Epithelial / LPF      RARE   Squamous Epithelial / LPF   Bacteria, UA      RARE   Bacteria, UA                Imaging results:  No results found.  Other results: XJD:BZMCEY sinus rhythm with first degree AV block         RV hypertrophy        Non specific T wave abnormality  PROLONG QT    486/493  Assessment & Plan by Problem: HYPOTENSION: She was found having a BP of 83/44, now 98/50. Looks mildly dehydrated, give her some fluids carefully due to her H/O CHF. Can hold her BP home meds. And restart once BP improved. CHEST PAIN: Looks non cardiac , but should rule out any cardiac cause by doing Troponin X 3 due to her PMH of CHF, pul. HTN, HTN. HEADACHE AND DIZZINESS: Most probably due to dehydration, reassess after some fluids. NAUSEA, VOMITING: No N/V now, keep monitoring. PULMONARY HTN; She is Dr. Agustina Caroli patient. Continue with her home meds. 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 To patient. GASTROPARESIS: Continue with Reglan and PPI. CHF: Monitor her I/O, and daily wt. Continue with home lasix.  DIET: Cardiac diet   NOTE: pt. Do not want to be intubated but want CPR if useful.  This is a Careers information officer Note.  The care of the patient was discussed with Dr. Aundra Dubin and the assessment and plan was formulated with their assistance.  Please see their note for official documentation of the patient encounter.   Signed: Lorella Nimrod, Med Student 08/03/2013, 6:48 PM

## 2013-08-03 NOTE — Progress Notes (Signed)
I saw and evaluated the patient.  I personally confirmed the key portions of the history and exam documented by Dr. Denton Brick and I reviewed pertinent patient test results.  The assessment, diagnosis, and plan were formulated together and I agree with the documentation in the resident's note.

## 2013-08-03 NOTE — Progress Notes (Signed)
Patient ID: [name], female   DOB: 01-05-1951, 63 y.o.   MRN: [id number]   Subjective:   Patient ID: [name] female   DOB: [birth year] 63 y.o.   MRN: [id number]  HPI: [name] is a 63 y.o. with PMH of Pulmonary HTN, essential HTN, Diabetes, gastroparesis, scleroderma, presented today for follow up visit, but with c/o of Vomiting- non bloody, pt says has been present since she was discharged from the hospital, not persistent, not everyday, last episode was on Tuesday, once but pt says it was of large quantity. Pt says she had an ice cream prior to her last episode of vomiting and had abdominal cramps.  Pt endorse feeling of fullness after meals. Gastroparesis- 2010- Evidencene of gastroparesis, with retention at 120s of 49%, normal- 30% at 111mns. Pt has also been taking her lasix- 434mtwice a day. She says she hasn't been producing a lot of urine like she previously used to while taking lasix, and wonders if its the same medication she is taking. At night she does not wake up to pass urine.  Pt denies leg swelling, increase in baseline SOB or orthopnea, doesn't feel she has put on weight. Per chart review, pt weight- 06/14/2013- 146, on discharge from the hospital, but this is likely an out-lier, as previous weight in clinic appear stable at about 150s.  Pt also describes some chest achiness in the center of her chest, that started after vomiting on tuesday, rates the achiness as a 5-6/10, persistent all day, not related to activity, non radiating, no associated diaphoresis, no family hx of CAD, but mother also had heart failure. Pt takes a daily aspirin. Pt endorses feeling dizzy but denies falls. No fever.   Past Medical History  Diagnosis Date  . Secondary pulmonary hypertension     right heart cath 04/20/04  . Cough   . Allergic rhinitis, cause unspecified   . Type II or unspecified type diabetes mellitus without mention of complication, not stated as uncontrolled   .  Degeneration of lumbar or lumbosacral intervertebral disc   . Esophageal reflux   . Systemic sclerosis   . Unspecified essential hypertension   . Rotator cuff tear   . Gastritis   . Sickle cell trait   . Arthritis   . Obesity   . Visual changes   . Scleroderma   . Diastolic dysfunction   . Trichomonas   . Vaginal bleeding   . Neuropathy   . CHF (congestive heart failure)    Current Outpatient Prescriptions  Medication Sig Dispense Refill  . aspirin EC 81 MG EC tablet Take 1 tablet (81 mg total) by mouth daily.  30 tablet  3  . esomeprazole (NEXIUM) 40 MG capsule Take by mouth 2 (two) times daily.        . fenofibrate (TRICOR) 48 MG tablet take 1 tablet by mouth once daily  30 tablet  11  . FLUoxetine (PROZAC) 20 MG capsule take 1 capsule by mouth every morning  30 capsule  2  . furosemide (LASIX) 40 MG tablet Take 1 tablet (40 mg total) by mouth 2 (two) times daily.  60 tablet  3  . gabapentin (NEURONTIN) 100 MG capsule take 1 capsule by mouth twice a day  60 capsule  6  . glucose blood test strip Check Blood Glucose 2-3 times a day.Dx Code:250.60.  100 each  3  . HYDROcodone-acetaminophen (NORCO) 10-325 MG per tablet Take 4 tablets by mouth every 4 (  four) hours as needed for moderate pain.       Marland Kitchen LANOXIN 125 MCG tablet       . LYRICA 150 MG capsule take 1 capsule by mouth three times a day  90 capsule  5  . metoCLOPramide (REGLAN) 10 MG tablet Take 1/2 tablet by mouth before meals and at bedtime  180 tablet  1  . ONE TOUCH LANCETS MISC Check Blood Glucose 2-3 times a day.Dx code: 250.60.  200 each  3  . Tadalafil, PAH, 20 MG TABS Take 2 tablets (40 mg total) by mouth daily.  60 tablet  0   No current facility-administered medications for this visit.   Family History  Problem Relation Age of Onset  . Heart disease Mother   . Diabetes Mother   . Diabetes Sister    History   Social History  . Marital Status: Divorced    Spouse Name: N/A    Number of Children: 2  . Years  of Education: 11   Occupational History  . Umemployed    Social History Main Topics  . Smoking status: Never Smoker   . Smokeless tobacco: Never Used  . Alcohol Use: No  . Drug Use: No  . Sexual Activity: None   Other Topics Concern  . None   Social History Narrative    FAMILY HISTORY:  Significant for coronary artery disease and diabetes   Review of Systems: CONSTITUTIONAL- No Fever, weightloss, night sweat, poor PO intake in the past, pt says she eats good sized meals now. SKIN- No Rash, colour changes or itching. HEAD- Mild Headache and dizziness. EYES- No Vision loss, pain, redness, double or blurred vision. EARS- No vertigo, hearing loss or ear discharge. Mouth/throat- No Sorethroat, or bleeding gums. RESPIRATORY- No Cough or increase in SOB. URINARY- No Frequency, urgency, straining or dysuria. NEUROLOGIC- No Numbness, syncope, seizures or burning. [facility]- Denies depression or anxiety.  Objective:  Physical Exam: Filed Vitals:   08/03/13 0931  BP: 83/54  Pulse: 63  Temp: 97 F (36.1 C)  TempSrc: Oral  Height: _0  (1.626 m)  Weight: 157 lb 9.6 oz (71.487 kg)  SpO2: 100%   Vitals- sitting and standing - 90/60, Pulse- 64.  GENERAL- alert, co-operative, appears as stated age, not in any distress. HEENT- Atraumatic, normocephalic, PERRL, EOMI, oral mucosa appears mildly dry. No carotid bruit, no cervical LN enlargement, thyroid does not appear enlarged, neck supple. CARDIAC- RRR, no murmurs, rubs or gallops. RESP- Moving equal volumes of air, and clear to auscultation bilaterally, no wheezes or crackles. ABDOMEN- Soft, nontender, no guarding or rebound, no palpable masses or organomegaly, bowel sounds present. BACK- Normal curvature of the spine, No tenderness along the vertebrae, no CVA tenderness. NEURO- No obvious Cr N abnormality, strenght upper and lower extremities- 5/5. EXTREMITIES- pulse 2+, symmetric, no pedal edema. SKIN- Warm, dry, No rash or  lesion. PSYCH- Normal mood and affect, appropriate thought content and speech.  EKG today-- Rate- 62bpm, regular, sinus rhythm, PR interval- 230- increased, QRS WNL, non specific T wave inversions V2, V3, V4, seen previously in February 2014.    Assessment & Plan:   The patient's case and plan of care was discussed with attending physician, Dr. Lenna Sciara. Marinda Elk.  Plan- Hypotension and Chest pain- Considering pts vitals, EKG findings, reported poor urine output, will admit pt. Pt likely dehydrated from taking Lasix- 71m twice a day, and with vomiting. Might require some IV hydration- very cautiously considering pulmonary HTN and right heart failure,  with strict input and output. Also considering EKG findings r/o ACS.  EKG done Troponin X1 CMP Urinalysis Admit to tele.

## 2013-08-03 NOTE — Progress Notes (Signed)
Report called to [facility] on [address].  Pt alert and oriented.  Saline lock in left hand.   Pt transported via wheelchair to [address].  Sander Nephew, RN 08/03/2013 3:08 PM

## 2013-08-03 NOTE — H&P (Signed)
Internal Medicine On-Call Attending Admission Note Date: 08/03/2013  Patient name: [name] Medical record number: [id number] Date of birth: 06/09/1950 Age: 63 y.o. Gender: female  I saw and evaluated the patient. I reviewed the resident's note and I agree with the resident's findings and plan as documented in the resident's note, with the following additional comments.  Chief Complaint(s): Dizziness, chest discomfort, hypotension  History - key components related to admission: Patient is a 63 year old woman with history of scleroderma, severe pulmonary hypertension, GERD, gastritis, DM, right-sided CHF, and other problems as outlined in the medical history, admitted with complaint of recent vomiting about 3 days prior to admission, dizziness, and chest "soreness".  She was found to be hypotensive in clinic with SBP in the low 80s.   Physical Exam - key components related to admission:  Filed Vitals:   08/03/13 2100 08/03/13 2103 08/03/13 2106 08/03/13 2227  BP: 97/58 1_0  Pulse: 68 75 67 69  Temp: 98.9 F (37.2 C) 98.5 F (36.9 C) 98.5 F (36.9 C)   TempSrc: Oral Oral Oral   Resp: _1 SpO2: 98% 96% 96% 96%    General appearance: alert and no distress Lungs: clear to auscultation bilaterally Heart: I2M4, 2/6 systolic murmur; no S3 Abdomen: mild epigastric tenderness Extremities: extremities normal, atraumatic, no cyanosis or edema  Lab results:   Basic Metabolic Panel:  Recent Labs  08/03/13 1120  NA 143  K 4.0  CL 104  CO2 24  GLUCOSE 91  BUN 40*  CREATININE 1.41*  CALCIUM 9.7    Liver Function Tests:  Recent Labs  08/03/13 1120  AST 20  ALT 8  ALKPHOS 145*  BILITOT 0.3  PROT 8.6*  ALBUMIN 3.7    CBC:  Recent Labs  08/03/13 1120  WBC 5.1  HGB 10.4*  HCT 31.4*  MCV 82.2  PLT 185    Recent Labs  08/03/13 1120  NEUTROABS 2.7  LYMPHSABS 1.8  MONOABS 0.5  EOSABS 0.1  BASOSABS 0.0    Cardiac  Enzymes:  Recent Labs  08/03/13 1120 08/03/13 1935  TROPONINI <0.30 <0.30      CBG:  Recent Labs  08/03/13 1033 08/03/13 2134  GLUCAP 93 108*     Urine Drug Screen: Drugs of Abuse     Component Value Date/Time   LABOPIA POSITIVE* 10/02/2010 1128   COCAINSCRNUR NONE DETECTED 10/02/2010 1128   LABBENZ NONE DETECTED 10/02/2010 1128   AMPHETMU NONE DETECTED 10/02/2010 1128   THCU NONE DETECTED 10/02/2010 1128   LABBARB NONE DETECTED 10/02/2010 1128       Urinalysis    Component Value Date/Time   COLORURINE YELLOW 08/03/2013 1200   APPEARANCEUR CLEAR 08/03/2013 1200   LABSPEC 1.010 08/03/2013 1200   PHURINE 6.0 08/03/2013 1200   GLUCOSEU NEG 08/03/2013 1200   HGBUR NEGATIVE 08/03/2013 1200   BILIRUBINUR NEGATIVE 08/03/2013 1200   KETONESUR NEGATIVE 08/03/2013 1200   PROTEINUR NEGATIVE 08/03/2013 1200   UROBILINOGEN 0.2 08/03/2013 1200   NITRITE NEG 08/03/2013 1200   LEUKOCYTESUR SMALL* 08/03/2013 1200    Urine microscopic:  Recent Labs  08/03/13 1200  EPIU RARE  WBCU 3-6*  RBCU 0-2  BACTERIA RARE      Other results: EKG: Sinus bradycardia with 1st degree A-V block; ST-t wave abnormality consider anterior ischemia; prolonged QT   Assessment & Plan by Problem:  1.  Hypotension.  Patient presented with dizziness and was found to hypotensive in clinic.  She  is on furosemide, and also reports some recent nausea and vomiting, so volume depletion is the likely cause of her dizziness.  Plan is careful IV volume replacement, with care to avoid volume overload; follow BP, I/Os, and clinical status.  2.  Chest discomfort.  Symptoms are atypical; EKG changes are nonspecific, and given mild epigastric tenderness and history of GERD and gastritis, this may be UGI in etiology. Initial cardiac enzymes are negative.  Plan is monitor; serial enzymes; PPI; consider further evaluation if symptoms persist.  3.  Other problems and plans as per the resident physician's note.   Active  Problems:   Hypotension

## 2013-08-04 ENCOUNTER — Encounter (HOSPITAL_COMMUNITY): Payer: Self-pay | Admitting: General Practice

## 2013-08-04 ENCOUNTER — Other Ambulatory Visit: Payer: Self-pay

## 2013-08-04 DIAGNOSIS — N183 Chronic kidney disease, stage 3 unspecified: Secondary | ICD-10-CM

## 2013-08-04 DIAGNOSIS — I509 Heart failure, unspecified: Secondary | ICD-10-CM

## 2013-08-04 DIAGNOSIS — R112 Nausea with vomiting, unspecified: Secondary | ICD-10-CM | POA: Diagnosis present

## 2013-08-04 DIAGNOSIS — I2789 Other specified pulmonary heart diseases: Secondary | ICD-10-CM

## 2013-08-04 LAB — DIGOXIN LEVEL

## 2013-08-04 LAB — BASIC METABOLIC PANEL
BUN: 39 mg/dL — AB (ref 6–23)
CHLORIDE: 104 meq/L (ref 96–112)
CO2: 21 meq/L (ref 19–32)
Calcium: 9.4 mg/dL (ref 8.4–10.5)
Creatinine, Ser: 1.36 mg/dL — ABNORMAL HIGH (ref 0.50–1.10)
GFR calc Af Amer: 48 mL/min — ABNORMAL LOW (ref >90)
GFR, EST NON AFRICAN AMERICAN: 41 mL/min — AB (ref >90)
Glucose, Bld: 109 mg/dL — ABNORMAL HIGH (ref 70–99)
POTASSIUM: 4 meq/L (ref 3.7–5.3)
Sodium: 139 mEq/L (ref 137–147)

## 2013-08-04 LAB — GLUCOSE, CAPILLARY
GLUCOSE-CAPILLARY: 84 mg/dL (ref 70–99)
Glucose-Capillary: 93 mg/dL (ref 70–99)

## 2013-08-04 LAB — TROPONIN I

## 2013-08-04 MED ORDER — FUROSEMIDE 40 MG PO TABS: 40.0000 mg | ORAL_TABLET | Freq: Every day | ORAL | Status: DC

## 2013-08-04 MED ORDER — PANTOPRAZOLE SODIUM 40 MG PO TBEC
40.0000 mg | DELAYED_RELEASE_TABLET | Freq: Every day | ORAL | Status: DC
  Administered 2013-08-04: 40 mg via ORAL
  Filled 2013-08-04: qty 1

## 2013-08-04 NOTE — Progress Notes (Signed)
Internal Medicine Attending  Date: 08/04/2013  Patient name: [name] Medical record number: [id number] Date of birth: [birth year] Age: 63 y.o. Gender: female  I saw and evaluated the patient. I discussed patient and reviewed the resident's note by Dr. Gordy Levan, and I agree with the resident's findings and plans as documented in her note, with the following additional comments.  Patient is doing well without complaints today.  Her systolic blood pressure has improved from the low 80s on presentation to normal today following careful IV normal saline volume replacement (in's and outs do not appear to be accurately documented; the patient received about one half liter of normal saline at 75 cc per hour last evening).  She denies chest pain, shortness of breath, or lower extremity edema today.  Exam is notable for clear lungs and no lower extremity edema.  I agree with the plan to discharge home today on reduced furosemide dose, with followup in clinic next week.

## 2013-08-04 NOTE — Progress Notes (Signed)
Subjective:   VSS.  Pt has no new complaints this AM and denies any CP, SOB, N/V.  Pt is feeling better and did not have any events overnight. VSS.  She had a bagel and eggs for breakfast.  Down 1.35L since yesterday.    She reports not eating much because she doesn't like to cook.  We stressed the importance of having 3 balanced meals per day.     Objective:   Vital signs in last 24 hours: Filed Vitals:   08/03/13 2106 08/03/13 2227 08/04/13 0500 08/04/13 0704  BP: 98/60 95/61  104/67  Pulse: 67 69  65  Temp: 98.5 F (36.9 C)   98.6 F (37 C)  TempSrc: Oral   Oral  Resp: _0 Weight:   154 lb 3.2 oz (69.945 kg)   SpO2: 96% 96%  96%    Weight: Filed Weights   08/04/13 0500  Weight: 154 lb 3.2 oz (69.945 kg)    Ins/Outs:  Intake/Output Summary (Last 24 hours) at 08/04/13 1155 Last data filed at 08/04/13 0706  Gross per 24 hour  Intake      0 ml  Output   1350 ml  Net  -1350 ml    Physical Exam: Constitutional: Vital signs reviewed.  Patient is sitting up/lying in bed in no acute distress and cooperative with exam.   HEENT: [place]/AT; PERRL, EOMI, conjunctivae normal/pale, no scleral icterus  Cardiovascular: RRR, no MRG Pulmonary/Chest: normal respiratory effort, no accessory muscle use, CTAB, no wheezes, rales, or rhonchi Abdominal: Thin. Soft. +BS, NT/ND Neurological: A&O x3, CN II_XII grossly intact; non-focal exam Extremities: 2+DP b/l, no C/C/E  Skin: Warm, dry and intact. No rash  Lab Results:  BMP:  Recent Labs Lab 08/03/13 1120 08/04/13 0120  NA 143 139  K 4.0 4.0  CL 104 104  CO2 24 21  GLUCOSE 91 109*  BUN 40* 39*  CREATININE 1.41* 1.36*  CALCIUM 9.7 9.4   Anion Gap:  14  CBC:  Recent Labs Lab 08/03/13 1120  WBC 5.1  NEUTROABS 2.7  HGB 10.4*  HCT 31.4*  MCV 82.2  PLT 185    Coagulation: No results found for this basename: LABPROT, INR,  in the last 168 hours  CBG:            Recent Labs Lab 08/03/13 1033  08/03/13 2134 08/04/13 0657 08/04/13 1128  GLUCAP 93 108* 93 84           HA1C:      No results found for this basename: HGBA1C,  in the last 168 hours  Lipid Panel: No results found for this basename: CHOL, HDL, LDLCALC, TRIG, CHOLHDL, LDLDIRECT,  in the last 168 hours  LFTs:  Recent Labs Lab 08/03/13 1120  AST 20  ALT 8  ALKPHOS 145*  BILITOT 0.3  PROT 8.6*  ALBUMIN 3.7    Pancreatic Enzymes: No results found for this basename: LIPASE, AMYLASE,  in the last 168 hours  Lactic Acid/Procalcitonin: No results found for this basename: LATICACIDVEN, PROCALCITON,  in the last 168 hours  Ammonia: No results found for this basename: AMMONIA,  in the last 168 hours  Cardiac Enzymes:  Recent Labs Lab 08/03/13 1120 08/03/13 1935 08/04/13 0120  TROPONINI <0.30 <0.30 <0.30    EKG: EKG Interpretation  Date/Time:    Ventricular Rate:    PR Interval:    QRS Duration:   QT Interval:    QTC Calculation:   R  Axis:     Text Interpretation:     BNP: No results found for this basename: PROBNP,  in the last 168 hours  D-Dimer: No results found for this basename: DDIMER,  in the last 168 hours  Urinalysis:  Recent Labs Lab 08/03/13 1200  COLORURINE YELLOW  LABSPEC 1.010  PHURINE 6.0  GLUCOSEU NEG  HGBUR NEGATIVE  BILIRUBINUR NEGATIVE  KETONESUR NEGATIVE  PROTEINUR NEGATIVE  UROBILINOGEN 0.2  NITRITE NEG  LEUKOCYTESUR SMALL*    Micro Results: No results found for this or any previous visit (from the past 240 hour(s)).  Blood Culture:    Component Value Date/Time   SDES URINE, CATHETERIZED 06/07/2013 2359   SPECREQUEST NONE 06/07/2013 2359   CULT  Value: NO GROWTH Performed at [facility] 06/07/2013 2359   REPTSTATUS 06/09/2013 FINAL 06/07/2013 2359    Studies/Results: No results found.  Medications:  Scheduled Meds: . aspirin EC  81 mg Oral Daily  . enoxaparin (LOVENOX) injection  40 mg Subcutaneous Q24H  . FLUoxetine  20 mg Oral Daily   . gabapentin  100 mg Oral BID  . insulin aspart  0-9 Units Subcutaneous TID WC  . metoCLOPramide  5 mg Oral TID AC & HS  . pantoprazole  40 mg Oral Q1200  . pregabalin  150 mg Oral TID  . sodium chloride  3 mL Intravenous Q12H  . Tadalafil (PAH)  40 mg Oral Daily   Continuous Infusions:  PRN Meds: acetaminophen, promethazine  Antibiotics: Antibiotics Given (last 72 hours)   None      Day of Hospitalization: 1  Consults:    Assessment/Plan:   Principal Problem:   Hypotension Active Problems:   DM type 2 with diabetic peripheral neuropathy   PULMONARY HYPERTENSION, SECONDARY   Gastroparesis   Acute right-sided CHF (congestive heart failure)  Hypotension  Resolved with IVF.  Denies any CP, SOB today.  Has chronic CP likely related to pulmonary HTN.  She presented yesterday with BP of 98/60 and appears that her BP has been this low previously on OV 06/27/13 SPB was 86.  She continues with a low po intake eating a snack in the morning and then eats supper. She does not eat lunch or dinner. Hypotension likely multifactorial due to decreased po intake, N/V while continuing lasix, lanoxin. Hemoglobin stable but chronically low at 10.4. Also taking norco which could be contributing. No reason to suspect pericardial effusion. TSH slightly elevated on 3/26 at 4.523 with normal FT4.  Admitted to telemetry observation and given 51m/h NS for 6 hrs.  Held BP meds and trended troponins which were negative x 3.  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 06/09/13 ProBNP is 2707-->down from previous value of 3360 on 3/26. Previous TTE reveals an LVEF of 50% with PAP of 921mg.  -avoid NSAIDS  -hold home meds  -I/Os, daily weights   AKI on Chronic Kidney Disease  Baseline creatinine ~1.5. Most likely etiology  prerenal given BUN/Cr ratio>20, in the setting of volume depletion from decreased po intake, diuretic use, and vomiting/diarrhea. UA indicates isosthenuria (likely from sickle cell trait?). Other etiologies include intrarenal or postrenal. No complications including volume overload, metabolic acidosis, hyperkalemia, hyperphosphatemia, hypocalcemia.  Lab Results   Component  Value  Date    CREATININE  1.41*  08/03/2013   -avoid nephrotoxic agents  h/o Hypertension  Pt presents with hypotension.  -hold home meds   Controlled Diabetes Mellitus II  Lab Results   Component  Value  Date    HGBA1C  5.3  06/07/2013   -ac and hs cbg   Gastroparesis  -continue reglan   GERD  Pt on chronic PPI at home  -continue home meds  VTE PPx  heparin sq  Disposition Anticipated discharge today as patient is improved with IVF. Close follow-up with PCP.     LOS: 1 day   Jones Bales, MD PGY-1, Internal Medicine Teaching Service [phone] (7AM-5PM Mon-Fri) 08/04/2013, 11:55 AM

## 2013-08-04 NOTE — Discharge Summary (Signed)
Name: [name] MRN: [id number] DOB: 15-Jan-1951 63 y.o. PCP: Sid Falcon, MD ______________________________________________________   Date of Admission: 08/03/2013  3:18 PM Date of Discharge: 08/04/2013 Attending Physician: Axel Filler, MD   Discharge Diagnosis: Hypotension Pulmonary Hypertension secondary to scleroderma Diastolic dysfunction CKDIII DMII, controlled Gastroparesis secondary to DMII GERD  Discharge Medications:   Medication List    STOP taking these medications       LANOXIN 0.125 MG tablet  Generic drug:  digoxin      TAKE these medications       aspirin 81 MG EC tablet  Take 1 tablet (81 mg total) by mouth daily.     esomeprazole 40 MG capsule  Commonly known as:  NEXIUM  Take by mouth 2 (two) times daily.     fenofibrate 48 MG tablet  Commonly known as:  TRICOR  take 1 tablet by mouth once daily     FLUoxetine 20 MG capsule  Commonly known as:  PROZAC  take 1 capsule by mouth every morning     furosemide 40 MG tablet  Commonly known as:  LASIX  Take 1 tablet (40 mg total) by mouth daily.     gabapentin 100 MG capsule  Commonly known as:  NEURONTIN  take 1 capsule by mouth twice a day     glucose blood test strip  Check Blood Glucose 2-3 times a day.Dx Code:250.60.     HYDROcodone-acetaminophen 10-325 MG per tablet  Commonly known as:  NORCO  Take 4 tablets by mouth every 4 (four) hours as needed for moderate pain.     LYRICA 150 MG capsule  Generic drug:  pregabalin  take 1 capsule by mouth three times a day     metoCLOPramide 10 MG tablet  Commonly known as:  REGLAN  Take 1/2 tablet by mouth before meals and at bedtime     multivitamin with minerals tablet  Take 1 tablet by mouth daily.     ONE TOUCH LANCETS Misc  Check Blood Glucose 2-3 times a day.Dx code: 250.60.     Tadalafil (PAH) 20 MG Tabs  Take 2 tablets (40 mg total) by mouth daily.        Disposition and follow-up:   [name] was  discharged from [facility] in cood condition to home.    Please address the following problems post discharge:  1. Blood pressure and lasix dose; pt admitted with hypotension and lasix decreased to 22m daily.  Please clarify if she should be on digoxin and dose.  Digoxin level: <0.3.   2. Resolution of dizziness. 3. Anemia   Labs / imaging needed at time of follow-up: BMP, cbc  Pending labs/ test needing follow-up: None  Follow-up Appointments: Follow-up Information   [facility] with [facility] Schedule an appointment as soon as possible for a visit in 1 week.   Contact information:   [address] [phone]     Discharge Instructions: Discharge Instructions   ([facility] Call MD:  Anytime you have any of the following symptoms: 1) 3 pound weight gain in 24 hours or 5 pounds in 1 week 2) shortness of breath, with or without a dry hacking cough 3) swelling in the hands, feet or stomach 4) if you have to sleep on extra pillows at night in order to breathe.    Complete by:  As directed  Call MD for:  persistant dizziness or light-headedness    Complete by:  As directed      Diet - low sodium heart healthy    Complete by:  As directed      Increase activity slowly    Complete by:  As directed            Consultations:   None  Procedures Performed:  No results found.  2D Echo:  N/A  Cardiac Cath:  N/A  Admission HPI:  [name] is a 63 y.o. female who has a PMH of pulmonary hypertension secondary to scleroderma, DMII, GERD/gastritis, hypertension, sickle cell trait, diastolic dysfunction. Pt presents to the [facility] with concerns of weight gain of 3lbs over one day. She has pulmonary hypertension and heart failure and weighs herself on the same scales each day and states that her weight increased 3 lbs overnight. She reports compliance with all her medications and denies missing any  doses. She also states that she was given a medicine for her pulmonary HTN that has caused her significant diarrhea over the past few months. Additionally, she reports eating some ice cream at [facility] on Tuesday and having 3 episodes of N/V (non-bloody, non-bilious) on Tuesday. Otherwise she has no acute complaints. She reports SOB with some CP that is a results of her pulmonary HTN but states that is chronic. She reports associated dizziness, lightheadedness, and HA. She denies any fever/chils, palpitations, urinary or bowel symptoms. No sick contacts.  EKG today shows asymmetric t wave inversions in precordial leads with poor R wave progression, probable right axis deviation, and right ventricular hypertrophy.    Hospital Course by problem list: Principal Problem:   Hypotension Active Problems:   DM type 2 with diabetic peripheral neuropathy   PULMONARY HYPERTENSION, SECONDARY   Gastroparesis   Acute right-sided CHF (congestive heart failure)   Hypotension  Presents with BP of 98/60 and appears that her BP has been this low previously on OV 06/27/13 SPB was 86. She is slightly symptomatic with HA and dizziness. Also reports on previous visits that she continues with a low po intake eating a snack in the morning and then eats supper. She does not eat lunch or dinner. Hypotension likely multifactorial due to decreased po intake, N/V while continuing lasix, lanoxin. Hemoglobin stable but chronically low at 10.4. Also taking norco which could be contributing. No reason to suspect pericardial effusion. TSH slightly elevated on 3/26 at 4.523 with normal FT4.  She was admitted to telemetry for observation, given NS_0 /h, held BP meds, and trending trops x 3 which were all negative.  Decreased lasix dose to 9m at discharge.  Digoxin level was <0.3 at discharge.  Please clarify if patient should be taking digoxin and at what dose at hospital follow-up.    Diastolic dysfunction  Does not appear  hypervolemic on exam without JVD, crackles, LE edema, ascites. 06/09/13 ProBNP is 2707-->down from previous value of 3360 on 3/26. Previous TTE reveals an LVEF of 50% with PAP of 962mg.  We avoided NSAIDS and held home meds.  Decreased lasix to 4029maily at discharge.   Chronic Kidney Disease III Baseline creatinine ~1.5. Most likely etiology prerenal given BUN/Cr ratio>20, in the setting of volume depletion from decreased po intake, diuretic use, and vomiting/diarrhea. UA indicates isosthenuria (likely from sickle cell trait?). Other etiologies include intrarenal or postrenal. No complications including volume overload, metabolic acidosis, hyperkalemia, hyperphosphatemia, hypocalcemia.   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   h/o Hypertension  Pt presents with hypotension.  Held home meds.   Controlled Diabetes Mellitus II  Lab Results   Component  Value  Date    HGBA1C  5.3  06/07/2013    Gastroparesis  Continued reglan.   GERD  Pt on chronic PPI at home which was continued.    Discharge Vitals:   BP 104/67  Pulse 65  Temp(Src) 98.6 F (37 C) (Oral)  Resp 16  Wt 154 lb 3.2 oz (69.945 kg)  SpO2 96%  Discharge Labs:  Results for orders placed during the hospital encounter of 08/03/13 (from the past 24 hour(s))  TROPONIN I     Status: None   Collection Time    08/03/13  7:35 PM      Result Value Ref Range   Troponin I <0.30  <0.30 ng/mL  GLUCOSE, CAPILLARY     Status: Abnormal   Collection Time    08/03/13  9:34 PM      Result Value Ref Range   Glucose-Capillary 108 (*) 70 - 99 mg/dL  DIGOXIN LEVEL     Status: Abnormal   Collection Time    08/03/13 11:00 PM      Result Value Ref Range   Digoxin Level <0.3 (*) 0.8 - 2.0 ng/mL  BASIC METABOLIC PANEL     Status: Abnormal   Collection Time    08/04/13  1:20 AM      Result Value Ref Range   Sodium 139  137 - 147 mEq/L    Potassium 4.0  3.7 - 5.3 mEq/L   Chloride 104  96 - 112 mEq/L   CO2 21  19 - 32 mEq/L   Glucose, Bld 109 (*) 70 - 99 mg/dL   BUN 39 (*) 6 - 23 mg/dL   Creatinine, Ser 1.36 (*) 0.50 - 1.10 mg/dL   Calcium 9.4  8.4 - 10.5 mg/dL   GFR calc non Af Amer 41 (*) >90 mL/min   GFR calc Af Amer 48 (*) >90 mL/min  TROPONIN I     Status: None   Collection Time    08/04/13  1:20 AM      Result Value Ref Range   Troponin I <0.30  <0.30 ng/mL  GLUCOSE, CAPILLARY     Status: None   Collection Time    08/04/13  6:57 AM      Result Value Ref Range   Glucose-Capillary 93  70 - 99 mg/dL  GLUCOSE, CAPILLARY     Status: None   Collection Time    08/04/13 11:28 AM      Result Value Ref Range   Glucose-Capillary 84  70 - 99 mg/dL    Signed: Jones Bales, MD [phone] 08/04/2013, 7:48 PM   Time Spent on Discharge: 14mnutes Services Ordered on Discharge: None Equipment Ordered on Discharge: None

## 2013-08-04 NOTE — Progress Notes (Signed)
Nutrition Brief Note  Patient identified on the Malnutrition Screening Tool (MST) Report for recent weight lost without trying.  Per wt readings below, patient's weight highly fluctuates; has been stable x 1 month.  Wt Readings from Last 15 Encounters:  08/04/13 154 lb 3.2 oz (69.945 kg)  08/03/13 157 lb 9.6 oz (71.487 kg)  07/04/13 157 lb (71.215 kg)  06/27/13 154 lb 9.6 oz (70.126 kg)  06/25/13 152 lb 9.6 oz (69.219 kg)  06/14/13 146 lb (66.225 kg)  06/05/13 183 lb (83.008 kg)  05/29/13 175 lb (79.379 kg)  05/14/13 167 lb (75.751 kg)  04/16/13 159 lb 1.6 oz (72.167 kg)  02/21/13 172 lb 3.2 oz (78.109 kg)  01/17/13 170 lb 12.8 oz (77.474 kg)  11/10/12 168 lb 12.8 oz (76.567 kg)  10/04/12 171 lb 6.4 oz (77.747 kg)  08/17/12 169 lb (76.658 kg)    Body mass index is 26.46 kg/(m^2). Patient meets criteria for Overweight based on current BMI.   Current diet order is Heath Healthy/Carbohydrate Modified.  Labs and medications reviewed.   No nutrition interventions warranted at this time. If nutrition issues arise, please consult RD.   Arthur Holms, RD, LDN Pager #: [phone] After-Hours Pager #: [phone]

## 2013-08-04 NOTE — Discharge Instructions (Addendum)
Please keep your follow-up appointments; this is very important for your continued recovery.    We have made the following changes to your medications: Take 1 tablet of furosemide (45m) daily instead of twice daily.  Please continue to take all of your medications as prescribed.  Do not miss any doses without contacting your primary physician.  If you have questions, please contact your physician.    Please bring your medicications with you to your appointments; medicications may be eye drops, herbals, vitamins, or pills.    If you believe you are having a life-threatening emergency, [facility] the [facility]      Nausea and Vomiting Nausea is a sick feeling that often comes before throwing up (vomiting). Vomiting is a reflex where stomach contents come out of your mouth. Vomiting can cause severe loss of body fluids (dehydration). Children and elderly adults can become dehydrated quickly, especially if they also have diarrhea. Nausea and vomiting are symptoms of a condition or disease. It is important to find the cause of your symptoms. CAUSES   Direct irritation of the stomach lining. This irritation can result from increased acid production (gastroesophageal reflux disease), infection, food poisoning, taking certain medicines (such as nonsteroidal anti-inflammatory drugs), alcohol use, or tobacco use.  Signals from the brain.These signals could be caused by a headache, heat exposure, an inner ear disturbance, increased pressure in the brain from injury, infection, a tumor, or a concussion, pain, emotional stimulus, or metabolic problems.  An obstruction in the gastrointestinal tract (bowel obstruction).  Illnesses such as diabetes, hepatitis, gallbladder problems, appendicitis, kidney problems, cancer, sepsis, atypical symptoms of a heart attack, or eating disorders.  Medical treatments such as chemotherapy and radiation.  Receiving medicine that makes you sleep (general  anesthetic) during surgery. DIAGNOSIS Your caregiver may ask for tests to be done if the problems do not improve after a few days. Tests may also be done if symptoms are severe or if the reason for the nausea and vomiting is not clear. Tests may include:  Urine tests.  Blood tests.  Stool tests.  Cultures (to look for evidence of infection).  X-rays or other imaging studies. Test results can help your caregiver make decisions about treatment or the need for additional tests. TREATMENT You need to stay well hydrated. Drink frequently but in small amounts.You may wish to drink water, sports drinks, clear broth, or eat frozen ice pops or gelatin dessert to help stay hydrated.When you eat, eating slowly may help prevent nausea.There are also some antinausea medicines that may help prevent nausea. HOME CARE INSTRUCTIONS   Take all medicine as directed by your caregiver.  If you do not have an appetite, do not force yourself to eat. However, you must continue to drink fluids.  If you have an appetite, eat a normal diet unless your caregiver tells you differently.  Eat a variety of complex carbohydrates (rice, wheat, potatoes, bread), lean meats, yogurt, fruits, and vegetables.  Avoid high-fat foods because they are more difficult to digest.  Drink enough water and fluids to keep your urine clear or pale yellow.  If you are dehydrated, ask your caregiver for specific rehydration instructions. Signs of dehydration may include:  Severe thirst.  Dry lips and mouth.  Dizziness.  Dark urine.  Decreasing urine frequency and amount.  Confusion.  Rapid breathing or pulse. SEEK IMMEDIATE MEDICAL CARE IF:   You have blood or brown flecks (like coffee grounds) in your vomit.  You have black or bloody  stools.  You have a severe headache or stiff neck.  You are confused.  You have severe abdominal pain.  You have chest pain or trouble breathing.  You do not urinate at least  once every 8 hours.  You develop cold or clammy skin.  You continue to vomit for longer than 24 to 48 hours.  You have a fever. MAKE SURE YOU:   Understand these instructions.  Will watch your condition.  Will get help right away if you are not doing well or get worse. Document Released: 03/01/2005 Document Revised: 05/24/2011 Document Reviewed: 07/29/2010 [facility] Patient Information 2014 [place], [place].  Hypotension As your heart beats, it forces blood through your arteries. This force is your blood pressure. If your blood pressure is too low for you to go about your normal activities or to support the organs of your body, you have hypotension. Hypotension is also referred to as low blood pressure. When your blood pressure becomes too low, you may not get enough blood to your brain. As a result, you may feel weak, feel lightheaded, or develop a rapid heart rate. In a more severe case, you may faint. CAUSES Various conditions can cause hypotension. These include:  Blood loss.  Dehydration.  Heart or endocrine problems.  Pregnancy.  Severe infection.  Not having a well-balanced diet filled with needed nutrients.  Severe allergic reactions (anaphylaxis). Some medicines, such as blood pressure medicine or water pills (diuretics), may lower your blood pressure below normal. Sometimes taking too much medicine or taking medicine not as directed can cause hypotension. TREATMENT  Hospitalization is sometimes required for hypotension if fluid or blood replacement is needed, if time is needed for medicines to wear off, or if further monitoring is needed. Treatment might include changing your diet, changing your medicines (including medicines aimed at raising your blood pressure), and use of support stockings. HOME CARE INSTRUCTIONS   Drink enough fluids to keep your urine clear or pale yellow.  Take your medicines as directed by your health care provider.  Get up slowly from  reclining or sitting positions. This gives your blood pressure a chance to adjust.  Wear support stockings as directed by your health care provider.  Maintain a healthy diet by including nutritious food, such as fruits, vegetables, nuts, whole grains, and lean meats. SEEK MEDICAL CARE IF:  You have vomiting or diarrhea.  You have a fever for more than 2 3 days.  You feel more thirsty than usual.  You feel weak and tired. SEEK IMMEDIATE MEDICAL CARE IF:   You have chest pain or a fast or irregular heartbeat.  You have a loss of feeling in some part of your body, or you lose movement in your arms or legs.  You have trouble speaking.  You become sweaty or feel lightheaded.  You faint. MAKE SURE YOU:   Understand these instructions.  Will watch your condition.  Will get help right away if you are not doing well or get worse. Document Released: 03/01/2005 Document Revised: 12/20/2012 Document Reviewed: 09/01/2012 [facility] Patient Information 2014 [place].

## 2013-08-04 NOTE — Progress Notes (Signed)
  Subjective: Patient was feeling much better, no chest pain or pain anywhere else. She states that sometime she don't feel like preparing her food so just take some snack, we council her about healthy eating and different options what she can do if unable to cook, she said she will try her best to eat and drink properly. Objective: Vital signs in last 24 hours: Filed Vitals:   08/03/13 2106 08/03/13 2227 08/04/13 0500 08/04/13 0704  BP: 98/60 95/61  104/67  Pulse: 67 69  65  Temp: 98.5 F (36.9 C)   98.6 F (37 C)  TempSrc: Oral   Oral  Resp: _0 Weight:   69.945 kg (154 lb 3.2 oz)   SpO2: 96% 96%  96%   Weight change:   Intake/Output Summary (Last 24 hours) at 08/04/13 1151 Last data filed at 08/04/13 0706  Gross per 24 hour  Intake      0 ml  Output   1350 ml  Net  -1350 ml   EXAM: Alert, oriented,and comfortable HEENT: AT/[place], PEERL< EOMI NECK: supple, no adenopathy, no JVD. LUNGS: Cleat without any added sound B/L HEART: S! And S2, RRR, no MRG ABD: S/NT/ND/ no HSM. BS + EXT: No C/E/ , PP 2+ B/L Lab Results: _1 @ Micro Results: No results found for this or any previous visit (from the past 240 hour(s)). Studies/Results: No results found. Medications: medication reviewed Scheduled Meds: . aspirin EC  81 mg Oral Daily  . enoxaparin (LOVENOX) injection  40 mg Subcutaneous Q24H  . FLUoxetine  20 mg Oral Daily  . gabapentin  100 mg Oral BID  . insulin aspart  0-9 Units Subcutaneous TID WC  . metoCLOPramide  5 mg Oral TID AC & HS  . pantoprazole  40 mg Oral Q1200  . pregabalin  150 mg Oral TID  . sodium chloride  3 mL Intravenous Q12H  . Tadalafil (PAH)  40 mg Oral Daily   Continuous Infusions:  PRN Meds:.acetaminophen, promethazine Assessment/Plan:  HYPOTENSION: Her BP today is 104/67, improved with some rehydration most probably due to volume depletion,can be discharged with reduced lasix dose.  CHEST PAIN: Resolved today and troponin x 3 came back  normal, may be due to vomiting .  HEADACHE AND DIZZINESS: Most probably due to dehydration, resolved today. NAUSEA, VOMITING: No N/V since her admission, may be due to some outside food she had before, resolved.  PULMONARY HTN; She is Dr. Agustina Caroli patient. Continue with her home meds.  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 To patient.  GASTROPARESIS: Continue with Reglan and PPI.  CHF:  Continue with a reduced dose of  home lasix.     This is a Careers information officer Note.  The care of the patient was discussed with Dr. Gordy Levan and the assessment and plan formulated with their assistance.  Please see their attached note for official documentation of the daily encounter.   LOS: 1 day   Lorella Nimrod, Med Student 08/04/2013, 11:51 AM

## 2013-08-04 NOTE — H&P (Signed)
I repeated the critical or key portions of the exam.  I confirmed/revised the medical student's history, exam, assessment and plan.  Please see my note for further details.

## 2013-08-04 NOTE — Progress Notes (Signed)
Agree with student Dr. Reesa Chew note see Dr. Charlyne Petrin note for further details   Aundra Dubin Md

## 2013-08-17 ENCOUNTER — Encounter: Payer: Self-pay | Admitting: Nurse Practitioner

## 2013-08-17 ENCOUNTER — Encounter (INDEPENDENT_AMBULATORY_CARE_PROVIDER_SITE_OTHER): Payer: Self-pay

## 2013-08-17 ENCOUNTER — Ambulatory Visit (INDEPENDENT_AMBULATORY_CARE_PROVIDER_SITE_OTHER): Payer: Medicare Other | Admitting: Nurse Practitioner

## 2013-08-17 ENCOUNTER — Ambulatory Visit: Payer: Medicare Other | Admitting: Nurse Practitioner

## 2013-08-17 VITALS — BP 94/58 | HR 74 | Ht 64.5 in | Wt 164.0 lb

## 2013-08-17 DIAGNOSIS — E1149 Type 2 diabetes mellitus with other diabetic neurological complication: Secondary | ICD-10-CM

## 2013-08-17 DIAGNOSIS — E1142 Type 2 diabetes mellitus with diabetic polyneuropathy: Secondary | ICD-10-CM

## 2013-08-17 DIAGNOSIS — R269 Unspecified abnormalities of gait and mobility: Secondary | ICD-10-CM

## 2013-08-17 DIAGNOSIS — R209 Unspecified disturbances of skin sensation: Secondary | ICD-10-CM

## 2013-08-17 MED ORDER — PREGABALIN 150 MG PO CAPS: 150.0000 mg | ORAL_CAPSULE | Freq: Three times a day (TID) | ORAL | Status: DC

## 2013-08-17 MED ORDER — GABAPENTIN 100 MG PO CAPS: 100.0000 mg | ORAL_CAPSULE | Freq: Two times a day (BID) | ORAL | Status: DC

## 2013-08-17 NOTE — Progress Notes (Signed)
[facility]  PATIENT: [name] DOB: [birth year]   REASON FOR VISIT: Followup for peripheral neuropathy   HISTORY OF PRESENT ILLNESS: Ms. Kappes, 63 year old female returns for followup. She has a history of diabetic peripheral neuropathy She presented in 2005 with bilateral feet paresthesias which preceded her diagnosis of type 2 diabetes. EMG nerve conduction study in 2006 was normal there was no evidence of large fiber peripheral neuropathy. Her symptoms have progressed over the years, she is currently on Lyrica 150 mg three times  daily and gabapentin 100 twice a day. She has a history of scleroderma and  is on low-dose prednisone. She had a recent hospital admission for hypotension due to nausea and vomiting. Her Lasix was reduced. She returns for reevaluation. Her neuropathy symptoms are stable    REVIEW OF SYSTEMS: Full 14 system review of systems performed and notable only for those listed, all others are neg:  Constitutional: N/A  Cardiovascular: N/A  Ear/Nose/Throat: N/A  Skin: N/A  Eyes: N/A  Respiratory: N/A  Gastroitestinal: N/A  Hematology/Lymphatic: N/A  Endocrine: N/A Musculoskeletal:N/A  Allergy/Immunology: N/A  Neurological: N/A Psychiatric: N/A Sleep : NA   ALLERGIES: Allergies  Allergen Reactions  . Codeine     REACTION: GI upset  . Contrast Media [Iodinated Diagnostic Agents] Hives  . Iohexol      Code: HIVES, Desc: pt breaks out in large hives. needs full premeds, Onset Date: [id number]   . Cephalexin Rash  . Ciprofloxacin Rash    HOME MEDICATIONS: Outpatient Prescriptions Prior to Visit  Medication Sig Dispense Refill  . aspirin EC 81 MG EC tablet Take 1 tablet (81 mg total) by mouth daily.  30 tablet  3  . esomeprazole (NEXIUM) 40 MG capsule Take by mouth 2 (two) times daily.        . fenofibrate (TRICOR) 48 MG tablet take 1 tablet by mouth once daily  30 tablet  11  . FLUoxetine (PROZAC) 20 MG capsule take 1 capsule by  mouth every morning  30 capsule  2  . gabapentin (NEURONTIN) 100 MG capsule take 1 capsule by mouth twice a day  60 capsule  6  . glucose blood test strip Check Blood Glucose 2-3 times a day.Dx Code:250.60.  100 each  3  . HYDROcodone-acetaminophen (NORCO) 10-325 MG per tablet Take 4 tablets by mouth every 4 (four) hours as needed for moderate pain.       Marland Kitchen LYRICA 150 MG capsule take 1 capsule by mouth three times a day  90 capsule  5  . metoCLOPramide (REGLAN) 10 MG tablet Take 1/2 tablet by mouth before meals and at bedtime  180 tablet  1  . Multiple Vitamins-Minerals (MULTIVITAMIN WITH MINERALS) tablet Take 1 tablet by mouth daily.      . ONE TOUCH LANCETS MISC Check Blood Glucose 2-3 times a day.Dx code: 250.60.  200 each  3  . Tadalafil, PAH, 20 MG TABS Take 2 tablets (40 mg total) by mouth daily.  60 tablet  0  . furosemide (LASIX) 40 MG tablet Take 1 tablet (40 mg total) by mouth daily.  60 tablet  3   No facility-administered medications prior to visit.    PAST MEDICAL HISTORY: Past Medical History  Diagnosis Date  . Secondary pulmonary hypertension     right heart cath 04/20/04  . Cough   . Allergic rhinitis, cause unspecified   . Esophageal reflux   . Systemic sclerosis   . Unspecified essential hypertension   .  Gastritis   . Sickle cell trait     "trace"  . Obesity   . Visual changes   . Scleroderma   . Diastolic dysfunction   . Trichomonas   . Vaginal bleeding   . Neuropathy   . CHF (congestive heart failure)   . Type II diabetes mellitus   . Bursitis   . History of blood transfusion     "think it was related to when I had partial hysteretomy; might have been for one of my knee ORs"  . Degeneration of lumbar or lumbosacral intervertebral disc   . Arthritis     "all over my body" (08/04/2013)  . Chronic kidney disease     "been in hospital for it; don't know what kind" (08/04/2013)    PAST SURGICAL HISTORY: Past Surgical History  Procedure Laterality Date  .  Tubal ligation    . Upper endoscopy with biopsy  03/10/2006  . Shoulder arthroscopy Right 12/2009    subacromial decompression  . Replacement total knee Left 11/2000  . Cardiac catheterization Right 04/24/2004  . Tonsillectomy  1960's  . Abdominal hysterectomy  1983    "partial"  . Joint replacement    . Shoulder arthroscopy w/ rotator cuff repair Right 01/2010  . Excisional total knee arthroplasty with antibiotic spacers Left 08/2010    "got infected & had to take 1st replacement out"  . Revision total knee arthroplasty Left 11/2010    "removed spacers; replaced knee"  . Total knee revision with scar debridement/patella revision with poly exchange Left 12/2010    fell; knee split opened; had to redo revision"  . Cardiac catheterization Left 07/2010  . Peripherally inserted central catheter insertion  09/2010    FAMILY HISTORY: Family History  Problem Relation Age of Onset  . Heart disease Mother   . Diabetes Mother   . Diabetes Sister     SOCIAL HISTORY: History   Social History  . Marital Status: Divorced    Spouse Name: N/A    Number of Children: 2  . Years of Education: 11   Occupational History  . Umemployed   . Disability     Social History Main Topics  . Smoking status: Never Smoker   . Smokeless tobacco: Never Used  . Alcohol Use: No  . Drug Use: No  . Sexual Activity: No   Other Topics Concern  . Not on file   Social History Narrative    FAMILY HISTORY:  Significant for coronary artery disease and diabetes   Patient lives at home with granddaughter.    Patient has 2 children.    Patient has 11 years of education.    Patient is on disability.    Patient is right handed.    Patient is separated.       PHYSICAL EXAM  Filed Vitals:   08/17/13 1107  BP: 94/58  Pulse: 74  Height: 5' 4.5" (1.638 m)  Weight: 164 lb (74.39 kg)   Body mass index is 27.73 kg/(m^2).  Generalized: Well developed, in no acute distress  Head: normocephalic and  atraumatic,. Oropharynx benign  Neck: Supple, no carotid bruits  Cardiac: Regular rate rhythm, no murmur  Musculoskeletal: No deformity   Neurological examination   Mentation: Alert oriented to time, place, history taking. Follows all commands speech and language fluent  Cranial nerve II-XII: Pupils were equal round reactive to light extraocular movements were full, visual field were full on confrontational test. Facial sensation and strength were normal. hearing was intact  to finger rubbing bilaterally. Uvula tongue midline. head turning and shoulder shrug were normal and symmetric.Tongue protrusion into cheek strength was normal. Motor: normal bulk and tone, full strength in the BUE, BLE, fine finger movements normal, no pronator drift. No focal weakness Sensory: Decreased light touch and pinprick to shin level, preserved vibratory sensation   Coordination: finger-nose-finger, heel-to-shin bilaterally, no dysmetria Reflexes: Brachioradialis 2/2, biceps 2/2, triceps 2/2, patellar 2/2, Achilles absent plantar responses were flexor bilaterally. Gait and Station: Rising up from seated position without assistance, normal stance,  moderate stride, good arm swing, smooth turning, able to perform tiptoe, and heel walking without difficulty. Tandem gait is steady. No assistive device  DIAGNOSTIC DATA (LABS, IMAGING, TESTING) - I reviewed patient records, labs, notes, testing and imaging myself where available.  Lab Results  Component Value Date   WBC 5.1 08/03/2013   HGB 10.4* 08/03/2013   HCT 31.4* 08/03/2013   MCV 82.2 08/03/2013   PLT 185 08/03/2013      Component Value Date/Time   NA 139 08/04/2013 0120   K 4.0 08/04/2013 0120   CL 104 08/04/2013 0120   CO2 21 08/04/2013 0120   GLUCOSE 109* 08/04/2013 0120   BUN 39* 08/04/2013 0120   CREATININE 1.36* 08/04/2013 0120   CREATININE 1.41* 08/03/2013 1120   CALCIUM 9.4 08/04/2013 0120   PROT 8.6* 08/03/2013 1120   ALBUMIN 3.7 08/03/2013 1120   AST 20  08/03/2013 1120   ALT 8 08/03/2013 1120   ALKPHOS 145* 08/03/2013 1120   BILITOT 0.3 08/03/2013 1120   GFRNONAA 41* 08/04/2013 0120   GFRNONAA 40* 08/03/2013 1120   GFRAA 48* 08/04/2013 0120   GFRAA 46* 08/03/2013 1120   Lab Results  Component Value Date   CHOL 98 04/16/2013   HDL 31* 04/16/2013   LDLCALC 48 04/16/2013   LDLDIRECT 62 12/09/2011   TRIG 93 04/16/2013   CHOLHDL 3.2 04/16/2013   Lab Results  Component Value Date   HGBA1C 5.3 06/07/2013    Lab Results  Component Value Date   TSH 4.523* 06/07/2013      ASSESSMENT AND PLAN  63 y.o. year old female  has a past medical history of small fiber neuropathy due to diabetes.   Continue Lyrica at current dose, will refill Continue gabapentin at current dose, will refill Followup yearly and when necessary Exercise by walking every day, good glycemic control Dennie Bible, [facility], [facility], APRN  [facility] [address] [place], [place] [address] [phone]

## 2013-08-17 NOTE — Patient Instructions (Signed)
Continue Lyrica at current dose, will refill Continue gabapentin at current dose, will refill Followup yearly and when necessary

## 2013-08-22 ENCOUNTER — Encounter: Payer: Self-pay | Admitting: Internal Medicine

## 2013-08-22 ENCOUNTER — Ambulatory Visit (INDEPENDENT_AMBULATORY_CARE_PROVIDER_SITE_OTHER): Payer: Medicare Other | Admitting: Internal Medicine

## 2013-08-22 VITALS — BP 104/66 | HR 65 | Temp 97.4°F | Resp 20 | Ht 64.0 in | Wt 168.9 lb

## 2013-08-22 DIAGNOSIS — E781 Pure hyperglyceridemia: Secondary | ICD-10-CM

## 2013-08-22 DIAGNOSIS — E1142 Type 2 diabetes mellitus with diabetic polyneuropathy: Secondary | ICD-10-CM

## 2013-08-22 DIAGNOSIS — I2789 Other specified pulmonary heart diseases: Secondary | ICD-10-CM

## 2013-08-22 DIAGNOSIS — D649 Anemia, unspecified: Secondary | ICD-10-CM

## 2013-08-22 DIAGNOSIS — E119 Type 2 diabetes mellitus without complications: Secondary | ICD-10-CM

## 2013-08-22 DIAGNOSIS — E1149 Type 2 diabetes mellitus with other diabetic neurological complication: Secondary | ICD-10-CM

## 2013-08-22 DIAGNOSIS — I5189 Other ill-defined heart diseases: Secondary | ICD-10-CM

## 2013-08-22 DIAGNOSIS — K921 Melena: Secondary | ICD-10-CM

## 2013-08-22 DIAGNOSIS — I5030 Unspecified diastolic (congestive) heart failure: Secondary | ICD-10-CM

## 2013-08-22 DIAGNOSIS — M349 Systemic sclerosis, unspecified: Secondary | ICD-10-CM

## 2013-08-22 DIAGNOSIS — K3184 Gastroparesis: Secondary | ICD-10-CM

## 2013-08-22 DIAGNOSIS — I509 Heart failure, unspecified: Secondary | ICD-10-CM

## 2013-08-22 DIAGNOSIS — R197 Diarrhea, unspecified: Secondary | ICD-10-CM

## 2013-08-22 DIAGNOSIS — I10 Essential (primary) hypertension: Secondary | ICD-10-CM

## 2013-08-22 LAB — BASIC METABOLIC PANEL WITH GFR
BUN: 26 mg/dL — ABNORMAL HIGH (ref 6–23)
CALCIUM: 9.2 mg/dL (ref 8.4–10.5)
CO2: 23 mEq/L (ref 19–32)
Chloride: 107 mEq/L (ref 96–112)
Creat: 1.25 mg/dL — ABNORMAL HIGH (ref 0.50–1.10)
GFR, Est African American: 54 mL/min — ABNORMAL LOW
GFR, Est Non African American: 47 mL/min — ABNORMAL LOW
GLUCOSE: 79 mg/dL (ref 70–99)
POTASSIUM: 4.8 meq/L (ref 3.5–5.3)
Sodium: 138 mEq/L (ref 135–145)

## 2013-08-22 LAB — CBC
HCT: 31.3 % — ABNORMAL LOW (ref 36.0–46.0)
HEMOGLOBIN: 10.1 g/dL — AB (ref 12.0–15.0)
MCH: 26.1 pg (ref 26.0–34.0)
MCHC: 32.3 g/dL (ref 30.0–36.0)
MCV: 80.9 fL (ref 78.0–100.0)
Platelets: 210 10*3/uL (ref 150–400)
RBC: 3.87 MIL/uL (ref 3.87–5.11)
RDW: 16 % — ABNORMAL HIGH (ref 11.5–15.5)
WBC: 5.7 10*3/uL (ref 4.0–10.5)

## 2013-08-22 LAB — POCT GLYCOSYLATED HEMOGLOBIN (HGB A1C): HEMOGLOBIN A1C: 5.4

## 2013-08-22 LAB — GLUCOSE, CAPILLARY: Glucose-Capillary: 68 mg/dL — ABNORMAL LOW (ref 70–99)

## 2013-08-22 NOTE — Patient Instructions (Addendum)
General Instructions: Please schedule a follow up visit within the next 2 weeks for a medication review.  Please bring ALL Of your medication bottles in at that time.   For your medications:   Please bring all of your pill  Bottles with you to each visit.  This will help make sure that we have an up to date list of all the medications you are taking.  Please also bring any over the counter herbal medications you are taking (not including advil, tylenol, etc.)  For your Lasix: Please take your lasix twice a day for 3 days only, check your weight daily and call these in to the clinic.  If you continue to gain weight, we may need to see you sooner.  You have an appointment with Dr. Harrington Challenger on 09/13/13.  Please keep this appointment.   You will have an appointment with Ms. Myrtice Lauth, Dr. Kelby Fam PA on this Friday June 12 at 1030am.  Please keep this appointment.    Please continue taking  Tadalafil Vicoden as needed Nexium Lyrica Gabapentin Fenofibrate Fluoxetine  Please STOP taking if you still are Reglan (as requested by your Pulmonologist) Lanoxin Lisinopril-hctz  We did a rectal exam today which showed some blood and a large external hemorrhoid.  I think you do need to follow up with Dr. Deatra Ina in GI about this.  Your diarrhea had improved the last time you saw him, but you started taking the reglan again.  Please stop taking this medication.   Thank you!    Treatment Goals:  Goals (1 Years of Data) as of 08/22/13     Lifestyle    . Prevent Falls       Progress Toward Treatment Goals:  Treatment Goal 08/22/2013  Hemoglobin A1C at goal  Blood pressure at goal    Self Care Goals & Plans:  Self Care Goal 08/22/2013  Manage my medications take my medicines as prescribed; refill my medications on time; bring my medications to every visit  Monitor my health keep track of my blood glucose; bring my glucose meter and log to each visit  Eat healthy foods eat fruit for snacks and  desserts; eat foods that are low in salt  Be physically active find an activity I enjoy    Home Blood Glucose Monitoring 08/22/2013  Check my blood sugar once a day  When to check my blood sugar -     Care Management & Community Referrals:  Referral 04/16/2013  Referrals made for care management support none needed  Referrals made to community resources -

## 2013-08-22 NOTE — Progress Notes (Signed)
Subjective:    Patient ID: [name], female    DOB: [birth year], 63 y.o.   MRN: [id number]  HPI [name] is a 63yo woman with PMH of DM2, Hyper TG, HTN, pHTN, GERD, gastroparesis, scleroderma, DJD, dCHF who presents for routine follow up.   Admitted March 2015 for Acute right sided heart failure where they stopped her lisinopril/hctz, metformin and macitentan.  Admitted again in May of 2015 and Digoxin was stopped.   Today, [name] reports that she has not had any more dizziness or lightheadedness since being in the hospital.  She has continued to have chest pain which describes as a soreness in the middle of her chest which sometimes goes to the left side of her chest.  Has not moved to arm.  The pain is reproducible and somewhat on the right side as well.  It seems sharp in nature.  At its worst it moves in to her arm.  The last time this happened was when she was admitted to the hospital.  No nausea or vomiting.  The sensation never goes completely away.  She was evaluated for MI during both hospitalizations and was found to have normal TnI.  Today the pain is "good."    She continues to have diarrhea and she is soiling her clothes.  This is happening "all the time."  She saw Dr. Deatra Ina in April and the symptoms had subsided, but soon after her symptoms returned.  Bowel movements are normal in the bathroom, however, she continues to have accidents in her underwear.  She does not lose urine.  She has no back pain or weakness in her legs.  No new medications, she has noticed some blood in her stool which is happening every once in a while.  She has an appointment with Dr. Deatra Ina for next month on 10/01/13.   She has had a colonoscopy about 5 years ago, which was normal.  No family members with colon cancer.    Another issue: Feels swollen, gaining weight at home.  Her lasix was decreased at last hospital stay.   She receives medications from multiple different providers and we reviewed them  today - -  Medications -  [facility] (Pulmonary - Lung) - Tracleer (stopped) and warfarin (stopped) - stopped at pulmonary visit in July 2014. Tracleer restarted in August 2014, changed to Jackpot in December 2014 and this was stopped in March 2015 due to diarrhea. Now on Tadalafil.  Deatra Ina (GI) - referred in March for diarrhea, no meds at this time [facility] (Rheumatology - Scleroderma) - Prednisone (stopped), reglan (stopped in March), vicoden, nexium, she reports starting back the reglan Krista Blue (Neurologist) - Lyrica, gabapentin  Harrington Challenger (Cardiology) - Lanoxin (stopped at [facility] discharge in May 2015), lasix Burns (eye)   From our clinic - Dr. Daryll Drown  Lisinopril-hctz 20-78m once daily - stopped in March hospitalization Fenofibrate 490monce daily  Metformin 50054mwo times per day - stopped during March Hospitalization Fluoxetine 79m29milyTad  We discussed her medications at length as I am not clear that she is certain what she is taking.  I asked her multiple times which pill bottles she has at home and she is not sure, changing answers based on what I ask.  I had asked to see her back in 2 weeks for medication review, but she told my nurse that she refused to bring her pill bottles in at all.    These are the medications currently in the system.  Current Outpatient Prescriptions on File Prior to Visit  Medication Sig Dispense Refill  . aspirin EC 81 MG EC tablet Take 1 tablet (81 mg total) by mouth daily.  30 tablet  3  . esomeprazole (NEXIUM) 40 MG capsule Take by mouth 2 (two) times daily.        . fenofibrate (TRICOR) 48 MG tablet take 1 tablet by mouth once daily  30 tablet  11  . FLUoxetine (PROZAC) 20 MG capsule take 1 capsule by mouth every morning  30 capsule  2  . furosemide (LASIX) 40 MG tablet Take 20 mg by mouth daily.      Marland Kitchen gabapentin (NEURONTIN) 100 MG capsule Take 1 capsule (100 mg total) by mouth 2 (two) times daily.  60 capsule  11  . glucose blood test strip Check Blood  Glucose 2-3 times a day.Dx Code:250.60.  100 each  3  . HYDROcodone-acetaminophen (NORCO) 10-325 MG per tablet Take 4 tablets by mouth every 4 (four) hours as needed for moderate pain.       Marland Kitchen metoCLOPramide (REGLAN) 10 MG tablet Take 1/2 tablet by mouth before meals and at bedtime  180 tablet  1  . Multiple Vitamins-Minerals (MULTIVITAMIN WITH MINERALS) tablet Take 1 tablet by mouth daily.      . ONE TOUCH LANCETS MISC Check Blood Glucose 2-3 times a day.Dx code: 250.60.  200 each  3  . pregabalin (LYRICA) 150 MG capsule Take 1 capsule (150 mg total) by mouth 3 (three) times daily.  90 capsule  5  . Tadalafil, PAH, 20 MG TABS Take 2 tablets (40 mg total) by mouth daily.  60 tablet  0  . lisinopril-hydrochlorothiazide (PRINZIDE,ZESTORETIC) 20-25 MG per tablet        No current facility-administered medications on file prior to visit.     Review of Systems  Constitutional: Negative for fever and fatigue.  HENT: Negative for ear discharge and hearing loss.   Eyes: Negative for pain and redness.  Respiratory: Negative for cough and shortness of breath.   Cardiovascular: Positive for chest pain (as per HPI) and leg swelling (feels it is worse today, she feels she has fluid in her stomach).  Gastrointestinal: Positive for diarrhea, abdominal distention (fluid in belly) and anal bleeding (occasional). Negative for nausea, abdominal pain, constipation and rectal pain.  Genitourinary: Negative for dysuria and difficulty urinating (She is concerned that she is not peeing as much since decrease in lasix).  Musculoskeletal: Negative for arthralgias and back pain.  Skin: Negative for rash and wound.  Neurological: Negative for dizziness, weakness and light-headedness.       Objective:   Physical Exam  Constitutional: She is oriented to person, place, and time.  Elderly woman with flat affect, NAD  HENT:  Head: Normocephalic and atraumatic.  Eyes: Pupils are equal, round, and reactive to light.  No scleral icterus.  Cardiovascular: Normal rate, regular rhythm and intact distal pulses.   Pulmonary/Chest: Effort normal and breath sounds normal. No respiratory distress. She has no wheezes.  Abdominal: Soft. Bowel sounds are normal. She exhibits no distension. There is no tenderness.  Genitourinary: Rectal exam shows external hemorrhoid. Rectal exam shows no fissure, no tenderness and anal tone normal. Guaiac positive stool.  Neurological: She is alert and oriented to person, place, and time.  Skin: Skin is warm and dry. She is not diaphoretic. No erythema.  Psychiatric: Her speech is normal. Thought content normal. Her affect is blunt. She is slowed.   BMP and  CBC today       Assessment & Plan:  RTC - requested 2 weeks for medication review, however, if she does not bring in her pill bottles this would be futile.  Will plan for her to make appointment, if pill bottles not present, will have brief meeting with her to encourage her to bring them in next time.   Please see problem based charting.

## 2013-08-23 DIAGNOSIS — R197 Diarrhea, unspecified: Secondary | ICD-10-CM | POA: Insufficient documentation

## 2013-08-23 NOTE — Assessment & Plan Note (Addendum)
Triglycerides are well controlled on fenofibrate, continue for now.  Fenofibrate can cause diarrhea in 3-4%, something to keep in mind if diarrhea persists.

## 2013-08-23 NOTE — Assessment & Plan Note (Signed)
Ongoing per patient.   She did start back her Reglan, which could be the culprit.  She also has blood in her stool, however, on rectal exam she had a large external hemorrhoid which is likely the cause of the blood.  She had a colonoscopy 5 years ago and may need further screening at [facility] discretion.   We have called the GI clinic and gotten her an earlier appointment for 6/12 at 1030am.

## 2013-08-23 NOTE — Assessment & Plan Note (Signed)
Follows with pulmonary.  She says she is taking Tadalafil, but I am unsure at this time.  Will defer treatment to that group.

## 2013-08-23 NOTE — Assessment & Plan Note (Signed)
She has been on lasix and follows with Ross in Cardiology. She was recently admitted for hypotension and her lasix was decreased to once daily.  It is unclear if she is taking 46m or 423mdaily at this time.  However, she feels that she is retaining fluid and her weight has increased 14 pounds since discharge.  She states that she is weighing herself daily.  Clinically today she is not SOB, lungs are clear and her abdomen is soft.  She has some non pitting edema to bilateral LE.   I have asked her to increase her lasix to BID for the next 3 days, weighing daily and calling the clinic with these weights.  If her weight continues to increase, will need to get her in to see cardiology sooner.  Given her most recent admission for hypotension and the concern I have that she is not taking her medications appropriately at home, I am hesitant to increase her lasix more than this at this time.    Given my concerns, [facility].  Will consult our SW and discuss further.

## 2013-08-23 NOTE — Assessment & Plan Note (Signed)
BP Readings from Last 3 Encounters:  08/22/13 104/66  08/17/13 94/58  08/04/13 104/67    Lab Results  Component Value Date   NA 138 08/22/2013   K 4.8 08/22/2013   CREATININE 1.25* 08/22/2013    Assessment: Blood pressure control: controlled Progress toward BP goal:  at goal Comments: She has been taken off her lisinopril/hctz and decreased on her lasix.  She was in the hospital recently for hypotension.  Will not start back medications at this time.   Plan: Medications:  Lasix 56m, and see plan for heart failure for further information Educational resources provided:   Self management tools provided:   Other plans: BMP today for renal function.

## 2013-08-23 NOTE — Assessment & Plan Note (Signed)
Checking CBC today

## 2013-08-23 NOTE — Assessment & Plan Note (Signed)
Follows with [facility] in Rheumatology.  Will need most recent note.

## 2013-08-23 NOTE — Assessment & Plan Note (Signed)
She had been taken off reglan given other medical issues as noted above.  I have advised her to stop.  She was unaware she was supposed to be off the medication.

## 2013-08-23 NOTE — Assessment & Plan Note (Signed)
Lab Results  Component Value Date   HGBA1C 5.4 08/22/2013   HGBA1C 5.3 06/07/2013   HGBA1C 5.0 04/16/2013     Assessment: Diabetes control: good control (HgbA1C at goal) Progress toward A1C goal:  at goal Comments: Metformin has been stopped at hospital stay, given diarrhea and acute illnesses.    Plan: Medications:  No therapy at this time, A1C is at goal.  Home glucose monitoring: Frequency: once a day Timing:   Instruction/counseling given: reminded to bring medications to each visit Educational resources provided: handout Self management tools provided: copy of home glucose meter download

## 2013-08-24 ENCOUNTER — Encounter: Payer: Self-pay | Admitting: Gastroenterology

## 2013-08-24 ENCOUNTER — Telehealth: Payer: Self-pay | Admitting: Licensed Clinical Social Worker

## 2013-08-24 ENCOUNTER — Ambulatory Visit (INDEPENDENT_AMBULATORY_CARE_PROVIDER_SITE_OTHER): Payer: Medicare Other | Admitting: Gastroenterology

## 2013-08-24 VITALS — BP 98/60 | HR 60 | Ht 64.0 in | Wt 167.0 lb

## 2013-08-24 DIAGNOSIS — R159 Full incontinence of feces: Secondary | ICD-10-CM

## 2013-08-24 MED ORDER — CHOLESTYRAMINE LIGHT 4 G PO PACK: PACK | ORAL | Status: DC

## 2013-08-24 MED ORDER — MOVIPREP 100 G PO SOLR
1.0000 | Freq: Once | ORAL | Status: DC
Start: 2013-08-24 — End: 2013-09-18

## 2013-08-24 NOTE — Patient Instructions (Addendum)
You have been scheduled for a colonoscopy with propofol with Dr. Deatra Ina. Please follow written instructions given to you at your visit today.  Please pick up your prep kit at the pharmacy within the next 1-3 days. If you use inhalers (even only as needed), please bring them with you on the day of your procedure. Your physician has requested that you go to www.startemmi.com and enter the access code given to you at your visit today. This web site gives a general overview about your procedure. However, you should still follow specific instructions given to you by our office regarding your preparation for the procedure.  We have sent the following medications to your pharmacy for you to pick up at your convenience: Questran, please take once or twice daily   Information on Kegel Exercises given today for you to read over and follow.

## 2013-08-24 NOTE — Telephone Encounter (Signed)
[name] was referred to CSW for [facility] referral.  Pt is insurance eligible.  CSW placed call to Ms. Vanderloop to discuss referral. CSW left message requesting return call. CSW provided contact hours and phone number.

## 2013-08-27 ENCOUNTER — Encounter: Payer: Self-pay | Admitting: Gastroenterology

## 2013-08-27 DIAGNOSIS — R159 Full incontinence of feces: Secondary | ICD-10-CM | POA: Insufficient documentation

## 2013-08-27 NOTE — Progress Notes (Signed)
     08/28/2013 [name] [id number] [birth year]   History of Present Illness:  This is a 63 year old female who is previously known to Dr. Deatra Ina for complaints of diarrhea. She presents to our office today complaining of fecal incontinence. She is a poor historian, but states that for the past 3 months her stool "just keeps coming". She's been having fecal leakage continually throughout the day and night. She has no problem holding her urine. She tells me that the stool is not diarrhea, but it is somewhat soft. Is causing discomfort around her anus and soreness with some blood upon wiping. She's never had similar complaints in the past but says that she cannot go on like this. It constantly leaks in her underwear all throughout the day.  She says that she tried taking metamucil daily for about a month but it did not help.  Her last colonoscopy was in June of 2010 by Dr. Lajoyce Corners at which time she was found to have only internal hemorrhoids at that time.   Current Medications, Allergies, Past Medical History, Past Surgical History, Family History and Social History were reviewed in [employer] record.   Physical Exam: BP 98/60  Pulse 60  Ht _0  (1.626 m)  Wt 167 lb (75.751 kg)  BMI 28.65 kg/m2 General: Well developed black female in no acute distress Head: Normocephalic and atraumatic Eyes:  Sclerae anicteric, conjunctiva pink  Ears: Normal auditory acuity Lungs: Clear throughout to auscultation Heart: Regular rate and rhythm Abdomen: Soft, non-distended.  Normal bowel sounds.  Non-tender. Rectal:  Large external hemorrhoid noted.  Light brown stool seen leaking from the rectum.  No masses or fecal impaction noted on DRE. She has decreased sphincter tone.  Musculoskeletal: Symmetrical with no gross deformities  Extremities: No edema  Neurological: Alert oriented x 4, grossly non-focal Psychological:  Alert and cooperative. Normal mood and affect  Assessment  and Recommendations: -Fecal incontinence:  ? Diarrhea.  Patient has very poor sphincter tone on exam.  Will treat with questran 1-2 times daily to try and help give the stools more form.  Colonoscopy to rule out structural issues.  She did not have any masses or fecal impaction on my exam today.  I have given her information on Kegel exercises.  She may need anorectal manometry and biofeedback if she does not respond to other conservative measures.   The risks, benefits, and alternatives were discussed with the patient and she consents to proceed.

## 2013-08-27 NOTE — Telephone Encounter (Signed)
CSW placed called to pt.  CSW left message requesting return call. CSW provided contact hours and phone number.

## 2013-08-28 NOTE — Telephone Encounter (Signed)
CSW placed call to Ms. Norton.  Discussed benefits of [facility] services, educational and nurse resources.  Pt states "I know which medication to take" and denied having any issues or needs.  Ms. Enloe declined [facility] services but agreeable to have CSW mail informational brochure.

## 2013-08-28 NOTE — Progress Notes (Signed)
Reviewed and agree with management. Robert D. Kaplan, M.D., FACG  

## 2013-09-12 ENCOUNTER — Encounter: Payer: Self-pay | Admitting: Internal Medicine

## 2013-09-12 ENCOUNTER — Ambulatory Visit (INDEPENDENT_AMBULATORY_CARE_PROVIDER_SITE_OTHER): Payer: Commercial Managed Care - HMO | Admitting: Internal Medicine

## 2013-09-12 VITALS — BP 128/74 | HR 64 | Temp 97.3°F | Ht 64.0 in | Wt 171.7 lb

## 2013-09-12 DIAGNOSIS — I519 Heart disease, unspecified: Secondary | ICD-10-CM

## 2013-09-12 DIAGNOSIS — I5189 Other ill-defined heart diseases: Secondary | ICD-10-CM

## 2013-09-12 DIAGNOSIS — R159 Full incontinence of feces: Secondary | ICD-10-CM

## 2013-09-12 DIAGNOSIS — I10 Essential (primary) hypertension: Secondary | ICD-10-CM

## 2013-09-12 LAB — BASIC METABOLIC PANEL WITH GFR
BUN: 22 mg/dL (ref 6–23)
CO2: 20 meq/L (ref 19–32)
CREATININE: 1.23 mg/dL — AB (ref 0.50–1.10)
Calcium: 9.5 mg/dL (ref 8.4–10.5)
Chloride: 108 mEq/L (ref 96–112)
GFR, Est African American: 55 mL/min — ABNORMAL LOW
GFR, Est Non African American: 47 mL/min — ABNORMAL LOW
GLUCOSE: 76 mg/dL (ref 70–99)
Potassium: 5.1 mEq/L (ref 3.5–5.3)
Sodium: 143 mEq/L (ref 135–145)

## 2013-09-12 LAB — GLUCOSE, CAPILLARY: GLUCOSE-CAPILLARY: 93 mg/dL (ref 70–99)

## 2013-09-12 LAB — PRO B NATRIURETIC PEPTIDE: PRO B NATRI PEPTIDE: 1481 pg/mL — AB (ref <126)

## 2013-09-12 MED ORDER — FUROSEMIDE 40 MG PO TABS: 20.0000 mg | ORAL_TABLET | Freq: Two times a day (BID) | ORAL | Status: DC

## 2013-09-12 MED ORDER — FUROSEMIDE 20 MG PO TABS
20.0000 mg | ORAL_TABLET | Freq: Once | ORAL | Status: AC
  Administered 2013-09-12: 20 mg via ORAL

## 2013-09-12 NOTE — Assessment & Plan Note (Signed)
Continues to be an issue.  She has GI follow up planned on 09/18/13.

## 2013-09-12 NOTE — Patient Instructions (Signed)
[name], it was a pleasure to see you today.   Since you have been gaining weight and swelling, I am concerned that you are having worsening of your heart failure.  You were on lasix 46m daily, but have been taking it twice a day since I last saw you.  Since you ran out of your medications 2 days ago, we will give you a dose of lasix here in the clinic and continue you on the twice a day dose which will be sent to your pharmacy.  You have an appointment with your Cardiologist tomorrow who manages your heart failure.  Please keep this appointment.    If you develop any of the following symptoms prior to your cardiology appointment, please go to the ED: worsening shortness of breath (not only when you lie flat), chest pain, worsening swelling, decreased urine, cough, fever, chills, nausea and vomiting.

## 2013-09-12 NOTE — Assessment & Plan Note (Addendum)
TTE from this year showed mild LVH with right sided systolic function severely reduced.  Today, she is having worsening swelling/edema and has been out of lasix X 2 days.  I have given her lasix 74m today and asked her to fill her Rx for lasix 275mBID.  She was previously on lasix 4037maily but had issues with hypotension.  I am concerned that her interstitial lung disease is playing a large part in her right sided failure (cor pulmonale).  She is due to see her cardiologist tomorrow as well.  I do not think she requires admission at this time, but this may change if she is not able to get her lasix Rx, or if her swelling and weight gain continue to worsen despite oral lasix.   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  However, on reading the pulmonary notes, an adequate medication regimen has been difficult to achieve.    BNP 1400, renal function is stable.

## 2013-09-12 NOTE — Progress Notes (Signed)
Subjective:    Patient ID: [name], female    DOB: 01-30-1951, 63 y.o.   MRN: [id number]  HPI  [name] is a 63yo woman with PMH of DM2, Hyper TG, HTN, pHTN, GERD, gastroparesis, scleroderma, DJD, dCHF who presents for routine follow up.   I last saw her in early June where she was complaining of diarrhea and blood in her stool.  She saw GI who reported that she had decreased rectal tone and recommended questran BID.  [name] reports that this medication did not help, but I am not sure if she took it.  She brought in a hand written list of her medications that does not contain that one.  She continues to have fecal incontinence occasionally.    She also noted at that time that she was having swollen feet and weight gain at home.  After her previous hospitalization for dizziness she was decreased in her lasix from 69m to 255m  Since I saw her, I asked her to increase her lasix to 2043mID.  She reports minimal improvement, and, because of the increased dose, she actually ran out of her medication.  She did not call for a refill.  She reports increased weight gain (17 pounds in our system since hospital discharge), swelling in the abdomen and legs and orthopnea.  She denies SOB with walking, chest pain, palpitations, dizziness, PND.  She was able to lay flat for exam today.   On exam today, her BP was concerning and low at 98/60.  On recheck she was 128/74 and was not orthostatic.  She was given lasix 24m40mal today and new Rx was sent to her pharmacy.  She was not dizzy after the lasix.    She brought in a hand written note for meds - -   Adcirca 40mg63mly Iron 65mg 39my Aspirin 81mg d9m lyrica 50mg ca24meTID Gabapentin 100mg BID26moxetine 24mg dail45mnofibrate 48mg daily35mix 24mg daily 30mocodone-apap 10/325 1 q 6 hours   Review of Systems  Constitutional: Positive for unexpected weight change (gain). Negative for fever and chills.  HENT: Negative for ear  discharge.   Eyes: Negative for photophobia and visual disturbance.  Respiratory: Negative for cough, chest tightness and shortness of breath.   Cardiovascular: Positive for leg swelling. Negative for chest pain and palpitations.  Gastrointestinal: Positive for diarrhea and abdominal distention. Negative for nausea and vomiting.  Genitourinary: Negative for dysuria and difficulty urinating.  Musculoskeletal: Negative for arthralgias and back pain.  Neurological: Negative for dizziness, syncope, weakness and light-headedness.       Objective:   Physical Exam  Constitutional: She is oriented to person, place, and time.  NAD, well appearing, not SOB.   HENT:  Head: Normocephalic and atraumatic.  Cardiovascular: Normal rate, regular rhythm and normal heart sounds.   No murmur heard. Pulmonary/Chest: Effort normal and breath sounds normal. No respiratory distress. She has no wheezes.  No crackles at the bases  Abdominal: Soft. Bowel sounds are normal. She exhibits distension (non tender or tense). There is no rebound and no guarding.  Musculoskeletal: She exhibits edema (1+pitting in the LE to the knees). She exhibits no tenderness.  Neurological: She is alert and oriented to person, place, and time.  Skin: Skin is warm and dry. No erythema.  Psychiatric: She has a normal mood and affect. Her behavior is normal.  JVD 5cm  Labs: Bmet for renal function.       Assessment & Plan:  Lasix today 20 mg in clinic today, refill Rx with enough to take twice per day.  Dr. Harrington Challenger appointment tomorrow.   F/U in 3-4 months

## 2013-09-13 ENCOUNTER — Encounter: Payer: Self-pay | Admitting: Internal Medicine

## 2013-09-13 ENCOUNTER — Ambulatory Visit (INDEPENDENT_AMBULATORY_CARE_PROVIDER_SITE_OTHER): Payer: Commercial Managed Care - HMO | Admitting: Internal Medicine

## 2013-09-13 VITALS — BP 104/64 | HR 70 | Ht 64.0 in | Wt 172.8 lb

## 2013-09-13 DIAGNOSIS — M349 Systemic sclerosis, unspecified: Secondary | ICD-10-CM

## 2013-09-13 DIAGNOSIS — I2721 Secondary pulmonary arterial hypertension: Secondary | ICD-10-CM

## 2013-09-13 DIAGNOSIS — I2789 Other specified pulmonary heart diseases: Secondary | ICD-10-CM

## 2013-09-13 NOTE — Progress Notes (Signed)
HPI Patient is a 63  year old with a history of scleroderma and pulmonary hypertension Patient seen in cardiology Last winter  At the time she was not interested in taking any more meds.  She did not want to take coumadin She was recently hospitialized with hypotension.  Hydrated.  Since discharge she denies dizziness.  Breathing is fair.  She denies CP   Abdomen is more bloated  Uncomfortable at times  Edema increased She says she takes lasix (?20 or ?40 mg) and that she doesn't respond.    Echo in March 2015 showed RVEF that was moderate to severely depressed.  Estimated PAP was 97 mm Hg LVEF was 50%  Allergies  Allergen Reactions  . Codeine     REACTION: GI upset  . Contrast Media [Iodinated Diagnostic Agents] Hives  . Iohexol      Code: HIVES, Desc: pt breaks out in large hives. needs full premeds, Onset Date: [id number]   . Cephalexin Rash  . Ciprofloxacin Rash    Current Outpatient Prescriptions  Medication Sig Dispense Refill  . aspirin EC 81 MG EC tablet Take 1 tablet (81 mg total) by mouth daily.  30 tablet  3  . cholestyramine light (PREVALITE) 4 G packet Can take up to twice daily  42 packet  0  . esomeprazole (NEXIUM) 40 MG capsule Take by mouth 2 (two) times daily.        . fenofibrate (TRICOR) 48 MG tablet take 1 tablet by mouth once daily  30 tablet  11  . FLUoxetine (PROZAC) 20 MG capsule take 1 capsule by mouth every morning  30 capsule  2  . furosemide (LASIX) 40 MG tablet Take 0.5 tablets (20 mg total) by mouth 2 (two) times daily.  60 tablet  1  . gabapentin (NEURONTIN) 100 MG capsule Take 1 capsule (100 mg total) by mouth 2 (two) times daily.  60 capsule  11  . glucose blood test strip Check Blood Glucose 2-3 times a day.Dx Code:250.60.  100 each  3  . HYDROcodone-acetaminophen (NORCO) 10-325 MG per tablet Take 4 tablets by mouth every 4 (four) hours as needed for moderate pain.       Marland Kitchen metoCLOPramide (REGLAN) 10 MG tablet Take 1/2 tablet by mouth before meals  and at bedtime  180 tablet  1  . MOVIPREP 100 G SOLR Take 1 kit (200 g total) by mouth once.  1 kit  0  . Multiple Vitamins-Minerals (MULTIVITAMIN WITH MINERALS) tablet Take 1 tablet by mouth daily.      . ONE TOUCH LANCETS MISC Check Blood Glucose 2-3 times a day.Dx code: 250.60.  200 each  3  . pregabalin (LYRICA) 150 MG capsule Take 1 capsule (150 mg total) by mouth 3 (three) times daily.  90 capsule  5  . Tadalafil, PAH, 20 MG TABS Take 2 tablets (40 mg total) by mouth daily.  60 tablet  0   No current facility-administered medications for this visit.    Past Medical History  Diagnosis Date  . Secondary pulmonary hypertension     right heart cath 04/20/04  . Cough   . Allergic rhinitis, cause unspecified   . Esophageal reflux   . Systemic sclerosis   . Unspecified essential hypertension   . Gastritis   . Sickle cell trait     "trace"  . Obesity   . Visual changes   . Scleroderma   . Diastolic dysfunction   . Trichomonas   . Vaginal bleeding   .  Neuropathy   . CHF (congestive heart failure)   . Type II diabetes mellitus     DIET CONTROL   . Bursitis   . History of blood transfusion     "think it was related to when I had partial hysteretomy; might have been for one of my knee ORs"  . Degeneration of lumbar or lumbosacral intervertebral disc   . Arthritis     "all over my body" (08/04/2013)  . Chronic kidney disease     "been in hospital for it; don't know what kind" (08/04/2013)    Past Surgical History  Procedure Laterality Date  . Tubal ligation    . Esophagogastroduodenoscopy  02/23/2010    multiple  . Shoulder arthroscopy Right 12/2009    subacromial decompression  . Replacement total knee Left 11/2000  . Cardiac catheterization Right 04/24/2004  . Tonsillectomy  1960's  . Abdominal hysterectomy  1983    "partial"  . Joint replacement    . Shoulder arthroscopy w/ rotator cuff repair Right 01/2010  . Excisional total knee arthroplasty with antibiotic spacers  Left 08/2010    "got infected & had to take 1st replacement out"  . Revision total knee arthroplasty Left 11/2010    "removed spacers; replaced knee"  . Total knee revision with scar debridement/patella revision with poly exchange Left 12/2010    fell; knee split opened; had to redo revision"  . Cardiac catheterization Left 07/2010  . Peripherally inserted central catheter insertion  09/2010  . Colonoscopy  09/06/2008    normal----Dr. Lajoyce Corners     Family History  Problem Relation Age of Onset  . Heart disease Mother   . Diabetes Mother   . Diabetes Sister     History   Social History  . Marital Status: Divorced    Spouse Name: N/A    Number of Children: 2  . Years of Education: 11   Occupational History  . Umemployed   . Disability     Social History Main Topics  . Smoking status: Never Smoker   . Smokeless tobacco: Never Used  . Alcohol Use: No  . Drug Use: No  . Sexual Activity: Not on file   Other Topics Concern  . Not on file   Social History Narrative    FAMILY HISTORY:  Significant for coronary artery disease and diabetes   Patient lives at home with granddaughter.    Patient has 2 children.    Patient has 11 years of education.    Patient is on disability.    Patient is right handed.    Patient is separated.      Review of Systems:  All systems reviewed.  They are negative to the above problem except as previously stated.  Vital Signs: BP 104/64  Pulse 70  Ht 5' 4" (1.626 m)  Wt 172 lb 12.8 oz (78.382 kg)  BMI 29.65 kg/m2  Physical Exam Patient is in NAD  Quiet HEENT:  Normocephalic, atraumatic.  Periorbital puffiness  Neck: JVP is increased  No bruits.  Lungs: clear to auscultation. No rales no wheezes.  Heart: Regular rate and rhythm. Normal S1, S2 (increased P2). No S3.   No significant murmurs. RV heave  Abdomen:  Sl distended.  Nontender  No definite hepatomegaly  Extremities:   Good distal pulses throughout. Tr to 1+ lower extremity edema.   Musculoskeletal :moving all extremities.  Neuro:   alert and oriented x3.  CN II-XII grossly intact.  Assessment and Plan: 1.  Pulmonary  hypertension with cor pulmonale.  The patinet had been seen in past by R Byrum  She has been on several meds for pulm HTN but has stopped. I am not going to make any changes today.  Discussed with her that I would recomm that she be seen by Dr. Haroldine Laws or Aundra Dubin in their clinic. She has been very difficult in past.   Watch fluids    She is does not seem to be sure of the dose of lasix she is taking .  I told her that she can take 2 pills and see hw she responds.   I will need to confirm dose with her.

## 2013-09-13 NOTE — Patient Instructions (Signed)
Your physician recommends that you continue on your current medications as directed. Please refer to the Current Medication list given to you today. Per Dr. Harrington Challenger, double up on your lasix 3 days per week.  (take 2 pills instead of one)--3 days per week only.  You are being referred to the heart failure clinic at [facility].

## 2013-09-18 ENCOUNTER — Ambulatory Visit (AMBULATORY_SURGERY_CENTER): Payer: Commercial Managed Care - HMO | Admitting: Gastroenterology

## 2013-09-18 ENCOUNTER — Encounter: Payer: Self-pay | Admitting: Gastroenterology

## 2013-09-18 VITALS — BP 127/77 | HR 60 | Temp 98.2°F | Resp 19 | Ht 64.0 in | Wt 167.0 lb

## 2013-09-18 DIAGNOSIS — R159 Full incontinence of feces: Secondary | ICD-10-CM

## 2013-09-18 DIAGNOSIS — K5289 Other specified noninfective gastroenteritis and colitis: Secondary | ICD-10-CM

## 2013-09-18 DIAGNOSIS — K648 Other hemorrhoids: Secondary | ICD-10-CM

## 2013-09-18 LAB — GLUCOSE, CAPILLARY
GLUCOSE-CAPILLARY: 109 mg/dL — AB (ref 70–99)
Glucose-Capillary: 71 mg/dL (ref 70–99)
Glucose-Capillary: 73 mg/dL (ref 70–99)

## 2013-09-18 MED ORDER — SODIUM CHLORIDE 0.9 % IV SOLN: 500.0000 mL | INTRAVENOUS | Status: DC

## 2013-09-18 MED ORDER — DEXTROSE 5 % IV SOLN: INTRAVENOUS | Status: DC

## 2013-09-18 NOTE — Progress Notes (Addendum)
Patient arrived in Admitting area with a CBG of 71.  D5W was started at a 46m/hr dose due to the fact that she had CHF in May.  Her breathing is good.  She is tired but attributes it to her blood sugar being low.  Her abdomen is quote distended.  She stated that it "had been like that."  1415   Gave patient 248mof D50W IV due to a CBG of only 73.   Patient states that she feels "about the same" after the D5W going in.  D5W is still infusing now at a KVO rate.

## 2013-09-18 NOTE — Patient Instructions (Signed)
YOU HAD AN ENDOSCOPIC PROCEDURE TODAY AT [facility]: Refer to the procedure report that was given to you for any specific questions about what was found during the examination.  If the procedure report does not answer your questions, please call your gastroenterologist to clarify.  If you requested that your care partner not be given the details of your procedure findings, then the procedure report has been included in a sealed envelope for you to review at your convenience later.  YOU SHOULD EXPECT: Some feelings of bloating in the abdomen. Passage of more gas than usual.  Walking can help get rid of the air that was put into your GI tract during the procedure and reduce the bloating. If you had a lower endoscopy (such as a colonoscopy or flexible sigmoidoscopy) you may notice spotting of blood in your stool or on the toilet paper. If you underwent a bowel prep for your procedure, then you may not have a normal bowel movement for a few days.  DIET: Your first meal following the procedure should be a light meal and then it is ok to progress to your normal diet.  A half-sandwich or bowl of soup is an example of a good first meal.  Heavy or fried foods are harder to digest and may make you feel nauseous or bloated.  Likewise meals heavy in dairy and vegetables can cause extra gas to form and this can also increase the bloating.  Drink plenty of fluids but you should avoid alcoholic beverages for 24 hours.  ACTIVITY: Your care partner should take you home directly after the procedure.  You should plan to take it easy, moving slowly for the rest of the day.  You can resume normal activity the day after the procedure however you should NOT DRIVE or use heavy machinery for 24 hours (because of the sedation medicines used during the test).    SYMPTOMS TO REPORT IMMEDIATELY: A gastroenterologist can be reached at any hour.  During normal business hours, 8:30 AM to 5:00 PM Monday through Friday,  call [phone].  After hours and on weekends, please call the GI answering service at [phone] who will take a message and have the physician on call contact you.   Following lower endoscopy (colonoscopy or flexible sigmoidoscopy):  Excessive amounts of blood in the stool  Significant tenderness or worsening of abdominal pains  Swelling of the abdomen that is new, acute  Fever of 100F or higher   FOLLOW UP: If any biopsies were taken you will be contacted by phone or by letter within the next 1-3 weeks.  Call your gastroenterologist if you have not heard about the biopsies in 3 weeks.  Our staff will call the home number listed on your records the next business day following your procedure to check on you and address any questions or concerns that you may have at that time regarding the information given to you following your procedure. This is a courtesy call and so if there is no answer at the home number and we have not heard from you through the emergency physician on call, we will assume that you have returned to your regular daily activities without incident.  SIGNATURES/CONFIDENTIALITY: You and/or your care partner have signed paperwork which will be entered into your electronic medical record.  These signatures attest to the fact that that the information above on your After Visit Summary has been reviewed and is understood.  Full responsibility of the confidentiality of  this discharge information lies with you and/or your care-partner.   Resume medications. Information given on hemorrhoids and high fiber diet with discharge instructions. Follow up in office in 6 weeks.

## 2013-09-18 NOTE — Op Note (Signed)
[facility] [place]  [employer]. [place], [address]   COLONOSCOPY PROCEDURE REPORT  PATIENT: [name], [name]  MR#: [id number] BIRTHDATE: 1950-12-26 , 62  yrs. old GENDER: Female ENDOSCOPIST: Inda Castle, MD REFERRED BY: PROCEDURE DATE:  09/18/2013 PROCEDURE:   Colonoscopy with biopsy First Screening Colonoscopy - Avg.  risk and is 50 yrs.  old or older - No.  Prior Negative Screening - Now for repeat screening. Other: See Comments  History of Adenoma - Now for follow-up colonoscopy & has been > or = to 3 yrs.  N/A  Polyps Removed Today? No.  Recommend repeat exam, <10 yrs? No. ASA CLASS:   Class II INDICATIONS:fecal incontinence. MEDICATIONS: MAC sedation, administered by CRNA and propofol (Diprivan) 155m IV  DESCRIPTION OF PROCEDURE:   After the risks benefits and alternatives of the procedure were thoroughly explained, informed consent was obtained.       The LB [id number]-H190 [id number] endoscope was introduced through the anus and advanced to the cecum, which was identified by both the appendix and ileocecal valve. No adverse events experienced.   The quality of the prep was Suprep good  The instrument was then slowly withdrawn as the colon was fully examined.      COLON FINDINGS: There were multiple AVMs measuring up to 8 mm throughout the colon, especially on the left side. There was possibly faint erythema throughout the colon.  Random biopsies were taken to rule out microscopic colitis.   There were multiple AVMs measuring up to 8 mm throughout the colon, especially on the left side. There was possibly faint erythema throughout the colon.  Random biopsies were taken to rule out microscopic colitis.   The colon was otherwise normal.  There was no diverticulosis, inflammation, polyps or cancers unless previously stated.   Internal hemorrhoids were found.   The colon was otherwise normal.  There was no diverticulosis, inflammation, polyps or cancers unless  previously stated.  Retroflexed views revealed no abnormalities. The time to cecum=3 minutes 33 seconds.  Withdrawal time=9 minutes 37 seconds. The scope was withdrawn and the procedure completed. COMPLICATIONS: There were no complications.  ENDOSCOPIC IMPRESSION: 1.   There were multiple AVMs measuring up to 8 mm throughout the colon, especially on the left side. There was possibly faint erythema throughout the colon.  Random biopsies were taken to rule out microscopic colitis. 2.   The colon was otherwise normal 3.   Internal hemorrhoids 4.   The colon was otherwise normal  RECOMMENDATIONS: 1.  Await biopsy results 2.  Continue current medications 3.  office visit 6 weeks   eSigned:  RInda Castle MD 09/18/2013 3:12 PM   cc: EDondra Spry MD   PATIENT NAME:  [name], AdlerMR#: [id number]

## 2013-09-18 NOTE — Progress Notes (Signed)
Report to PACU, RN, vss, BBS= Clear.  

## 2013-09-18 NOTE — Progress Notes (Signed)
Called to room to assist during endoscopic procedure.  Patient ID and intended procedure confirmed with present staff. Received instructions for my participation in the procedure from the performing physician.

## 2013-09-19 ENCOUNTER — Telehealth: Payer: Self-pay | Admitting: *Deleted

## 2013-09-19 NOTE — Telephone Encounter (Signed)
  Follow up Call-  Call back number 09/18/2013  Post procedure Call Back phone  # [phone]  Permission to leave phone message Yes     Patient questions:  Do you have a fever, pain , or abdominal swelling? No. Pain Score  0 *  Have you tolerated food without any problems? Yes.    Have you been able to return to your normal activities? Yes.    Do you have any questions about your discharge instructions: Diet   No. Medications  No. Follow up visit  No.  Do you have questions or concerns about your Care? No.  Actions: * If pain score is 4 or above: No action needed, pain <4.

## 2013-09-25 ENCOUNTER — Other Ambulatory Visit: Payer: Self-pay

## 2013-09-25 DIAGNOSIS — K52839 Microscopic colitis, unspecified: Secondary | ICD-10-CM

## 2013-09-25 MED ORDER — MESALAMINE 1.2 G PO TBEC: 2.4000 g | DELAYED_RELEASE_TABLET | Freq: Every day | ORAL | Status: DC

## 2013-10-01 ENCOUNTER — Ambulatory Visit: Payer: Medicare Other | Admitting: Gastroenterology

## 2013-10-09 ENCOUNTER — Telehealth: Payer: Self-pay

## 2013-10-09 NOTE — Telephone Encounter (Signed)
Message copied by Algernon Huxley on Tue Oct 09, 2013  3:10 PM ------      Message from: HUNT, Virginia R      Created: Tue Sep 25, 2013  1:51 PM      Regarding: Bmet       Pt needs Bmet in 2 weeks, order in epic ------

## 2013-10-09 NOTE — Telephone Encounter (Signed)
Spoke with pt and she is aware.

## 2013-10-11 ENCOUNTER — Other Ambulatory Visit (INDEPENDENT_AMBULATORY_CARE_PROVIDER_SITE_OTHER): Payer: Commercial Managed Care - HMO

## 2013-10-11 DIAGNOSIS — K5289 Other specified noninfective gastroenteritis and colitis: Secondary | ICD-10-CM

## 2013-10-11 DIAGNOSIS — K52839 Microscopic colitis, unspecified: Secondary | ICD-10-CM

## 2013-10-11 LAB — BASIC METABOLIC PANEL
BUN: 32 mg/dL — AB (ref 6–23)
CO2: 25 mEq/L (ref 19–32)
Calcium: 9.5 mg/dL (ref 8.4–10.5)
Chloride: 108 mEq/L (ref 96–112)
Creatinine, Ser: 1.5 mg/dL — ABNORMAL HIGH (ref 0.4–1.2)
GFR: 47.05 mL/min — ABNORMAL LOW (ref >60.00)
Glucose, Bld: 121 mg/dL — ABNORMAL HIGH (ref 70–99)
Potassium: 4.5 mEq/L (ref 3.5–5.1)
Sodium: 141 mEq/L (ref 135–145)

## 2013-10-15 ENCOUNTER — Other Ambulatory Visit: Payer: Self-pay

## 2013-10-15 DIAGNOSIS — R7989 Other specified abnormal findings of blood chemistry: Secondary | ICD-10-CM

## 2013-10-17 ENCOUNTER — Other Ambulatory Visit: Payer: Self-pay | Admitting: Internal Medicine

## 2013-10-29 ENCOUNTER — Telehealth: Payer: Self-pay

## 2013-10-29 NOTE — Telephone Encounter (Signed)
Left message for pt to call back.

## 2013-10-29 NOTE — Telephone Encounter (Signed)
Message copied by Algernon Huxley on Mon Oct 29, 2013  1:42 PM ------      Message from: HUNT, Virginia R      Created: Mon Oct 15, 2013 10:08 AM      Regarding: bmet       Needs bmet, order in ------

## 2013-10-31 NOTE — Telephone Encounter (Signed)
Spoke with pt and she is aware. 

## 2013-11-01 ENCOUNTER — Other Ambulatory Visit (INDEPENDENT_AMBULATORY_CARE_PROVIDER_SITE_OTHER): Payer: Commercial Managed Care - HMO

## 2013-11-01 DIAGNOSIS — R7989 Other specified abnormal findings of blood chemistry: Secondary | ICD-10-CM

## 2013-11-01 DIAGNOSIS — R799 Abnormal finding of blood chemistry, unspecified: Secondary | ICD-10-CM

## 2013-11-01 LAB — BASIC METABOLIC PANEL
BUN: 30 mg/dL — ABNORMAL HIGH (ref 6–23)
CALCIUM: 9.3 mg/dL (ref 8.4–10.5)
CO2: 23 meq/L (ref 19–32)
Chloride: 110 mEq/L (ref 96–112)
Creatinine, Ser: 1.2 mg/dL (ref 0.4–1.2)
GFR: 57.42 mL/min — ABNORMAL LOW (ref >60.00)
Glucose, Bld: 87 mg/dL (ref 70–99)
Potassium: 4.4 mEq/L (ref 3.5–5.1)
SODIUM: 140 meq/L (ref 135–145)

## 2013-11-05 ENCOUNTER — Other Ambulatory Visit: Payer: Self-pay | Admitting: Internal Medicine

## 2013-11-05 DIAGNOSIS — M349 Systemic sclerosis, unspecified: Secondary | ICD-10-CM

## 2013-11-05 DIAGNOSIS — I2789 Other specified pulmonary heart diseases: Secondary | ICD-10-CM

## 2013-11-08 ENCOUNTER — Other Ambulatory Visit (INDEPENDENT_AMBULATORY_CARE_PROVIDER_SITE_OTHER): Payer: Commercial Managed Care - HMO

## 2013-11-08 ENCOUNTER — Encounter: Payer: Self-pay | Admitting: Emergency Medicine

## 2013-11-08 ENCOUNTER — Ambulatory Visit (INDEPENDENT_AMBULATORY_CARE_PROVIDER_SITE_OTHER): Payer: Commercial Managed Care - HMO | Admitting: Emergency Medicine

## 2013-11-08 VITALS — BP 94/62 | HR 72 | Temp 98.5°F | Ht 64.0 in | Wt 171.8 lb

## 2013-11-08 DIAGNOSIS — I2789 Other specified pulmonary heart diseases: Secondary | ICD-10-CM

## 2013-11-08 LAB — HEPATIC FUNCTION PANEL
ALBUMIN: 3.5 g/dL (ref 3.5–5.2)
ALK PHOS: 141 U/L — AB (ref 39–117)
ALT: 12 U/L (ref 0–35)
AST: 24 U/L (ref 0–37)
Bilirubin, Direct: 0.5 mg/dL — ABNORMAL HIGH (ref 0.0–0.3)
TOTAL PROTEIN: 8.5 g/dL — AB (ref 6.0–8.3)
Total Bilirubin: 1.2 mg/dL (ref 0.2–1.2)

## 2013-11-08 NOTE — Assessment & Plan Note (Signed)
She likely needs some temporary diuresis, but she cannot tell me how much lasix she has been taking. She believes she is still taking the adcirca. She will need a second agent (tracleer and opsumit have not worked). Will try letaris since she wouldn't need freq labs. She will have to call us to let us know how much lasix she is on, allow [place] to make adjustments. rov 1

## 2013-11-08 NOTE — Patient Instructions (Addendum)
Please continue Adcirca 6m daily Check Lab work today We will start Letaris 56mdaily  We may need to increase your lasix temporarily, but we need to know your current dose in order to do this. Please call our office to list off all of your current medications.  Follow with Dr ByLamonte Sakain 1 month

## 2013-11-08 NOTE — Progress Notes (Signed)
[name] is a 63 year old woman with scleroderma and severe secondary pulmonary hypertension. She had been treated with bosentan + Coumadin. Initiated [name], Adcirca in the past but unable to tolerate. Finally started on inhaled Tyvaso in 2010. Some delay in titrating up to goal dosing of 9 puffs 4x a day due to compliance issues, but finally able to do so. This was then stopped in Summer 2012 during admission for septic knee hardware.   By TTE, her PASP improved from 102 mmHg (7/09) to 11mHg (9/10), 6 minute walk improved to 3290m11/2010).   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 That hospitalization ultimately resulted in ICU stay for sepsis, c/b renal insufficiency. TTE on June 29 showed estimated RVSP of 63 mmHg (off Tyvaso). We sent her home on bosentan + coumadin, with plans to restart Tyvaso as outpt once she stabilized. She went home on IV Abx.   Readmitted 09/2010 with fever and leukocytosis, S Cr 1.8 - 2.0. Pulm/CCM consulted to assist w her management of PAH medications.  ROV 10/29/10 -- scleroderma, PAH. Has been hospitalized x3 since our last visit due to infected knee hardware. She is off tyvaso. She is planning to go back for repeat L knee surgery this month. She states that her breathing has been going well. She is on Tracleer. She does not want to restart the Tyvaso at this time. Denies dyspnea, syncope, CP.  She is finished with IV abx.   ROV 02/01/11 -- scleroderma, PAH. Remains on coumadin + bosentan but off Tyvaso as above. Last TTE on June 29 showed estimated RVSP of 63 mmHg (off Tyvaso).  She returns for f/u. Tells me that since last visit she underwent f/u knee sgy to replace hardware. She feels it is doing well, now off abx. She is not interested in going back on Tyvaso. Her breathing  seems to be stable, although she isn't really pushing herself.   ROV 06/21/11 -- scleroderma, PAH. Remains on coumadin + bosentan but off Tyvaso. She clinically feels well, has been able to increase her activity but PAP's have increased by TTE 09/11/10. We have planned for repeat TTE in June 2013. Remains on Pred 30m11md for her scleroderma. She asks today about whether a FXa inhibitor would be possible for her, but I don't know of any [facility]. Due for LFT's, wants to get them at coumadin clinic if possible, she will call to arrange.   ROV 10/04/11 -- scleroderma, PAH. Remains on coumadin + bosentan but off Tyvaso. TTE on 08/18/11 shows PASP rising, now 830m80m(from 63 08/2010; her best was 42 in 11/2008). Clinically, she feels well - no breathing complaints. Her wt has been stable, trace edema without change. She ambulates and does her usual activities, denies dyspnea.   ROV 12/13/11 -- scleroderma, PAH. Remains on coumadin + bosentan but off Tyvaso. TTE on 08/18/11 shows PASP rising, now 830mm630mfrom 63 08/2010; her best was 42 in 11/2008). Returns today to discuss Tyvaso - She is still against doing it due to the fact that it is time consuming, difficult to do 4-6 times a day. She isn't sure that it helps her breathing. Her TTE showed moderate RV dilation 6/13. Denies SOB, edema or CP.   ROV 10/04/12 -- scleroderma, PAH.  Remains on coumadin + bosentan but off Tyvaso. She has not had her TTE. Has not been compliant [facility]. Last LFT in my records 05/2012. She reports that she has been feeling well. No breathing limitations. She is asking to be off of coumadin, off of Tracleer.   ROV 11/10/12 -- scleroderma, PAH. She has been on Tyvaso before, stopped due to difficulty administering. I stopped her Tracleer and coumadin 7/23 because she wouldn't get her labs checked reliably. She returns and states that she misses the Tracleer >> she has felt weaker, stayed in the bed,  more dyspnea, decreased functional capacity.   ROV 01/17/13 -- scleroderma, PAH. She has been on Tyvaso before, stopped due to difficulty administering. I stopped her bosentan due to non-compliance with LFT's. She missed it so I restarted 62.35m bid 8/29. I didn't restart the coumadin. She returns without any LFT since 11/10/12. I discussed macitentan with her today. I have recommended switching since LFT's don't have to be followed.   ROV 02/21/13 -- hx scleroderma, PAH. Has been on and off several PAH meds and also coumadin as detailed above. We started macitentan last time. She is currently taking at night - 114m She was having nausea and diarrhea at first, then this got better when she switched to taking at night. Her breathing is stable, maybe slightly better.  Her PASP on TTE 01/24/13 > stable at 8769m  ROV 05/29/13 -- follows up for scleroderma, PAH. Has been on and off several PAH meds and also coumadin. I stopped macitentan beginning of March due to persistent diarrhea. Since then she has continued to have diarrhea. Her breathing has worsened since we stopped the macitentan and she has more edema. She stopped lisinopril/HCTZ a week ago by PCP due to hyperkalemia. She is due for repeat TTE in June. C diff was negative. She is taking loperamide prn. Still on reglan.    ROV 06/05/13 -- follow up for [age] woman with scleroderma and PAH. Seen last week for diarrhea, abd pain. We stopped her macitentan 05/14/13 concerned that this was a possible side effect, but the sx have persisted. She has also been retaining more fluid, has gained wt. I gave her lasix x 3 days a week ago. Her S Cr has risen to 1.6 from 1.3.  We stopped reglan last visit. She has still had frequent diarrhea, nausea. Her dyspnea has worsened. The loperamide has helped some.   ROV 06/14/13 -- follow up for [age] woman with scleroderma and PAH. She was hospitalized in late March for decompensated cor pulmonale, diuresed. She feels  significantly better, has lost 40+ lbs with diuresis. In the hospital the Tracleer was stopped and she was started on Adcirca. She feels better, less swelling.   ROV [age] woman w scleroderma (no longer on pred per her report) and secondary PAH. She has been on various PAH meds in the past. Most recently she is on Adcirca. She feels "ok", but admits to being limited w her activity. She has gained 14 lbs and is more SOB.  No hospitalizations.  She is taking care of her 4 grandchildren. She is confused about her meds > can't recall what she's on (ie her lasix dose).     Exam Filed Vitals:   11/08/13 1141  BP: 94/62  Pulse: 72  Temp: 98.5 F (36.9 C)  TempSrc: Oral  Height: _0  (1.626 m)  Weight: 171 lb 12.8 oz (77.928 kg)  SpO2: 97%  Gen: Pleasant, well-nourished, in no distress,    ENT: No lesions,  mouth clear,  oropharynx clear, no postnasal drip  Neck: No JVD, no TMG, no carotid bruits  Lungs: No use of accessory muscles, no dullness to percussion, clear without rales or rhonchi  Cardiovascular: RRR, heart sounds normal, no murmur or gallops, no peripheral edema  Musculoskeletal: No deformities, NO pretibial edema (much improved).   Neuro: alert, non focal  Skin: Warm, no lesions or rashes   06/08/13 --  Study Conclusions  - Left ventricle: Flattening of the septum. The cavity size was normal. Wall thickness was increased in a pattern of mild LVH. The estimated ejection fraction was 50%. - Left atrium: The atrium was mildly dilated. - Right ventricle: The cavity size was moderately to severely dilated. Systolic function was moderately to severely reduced. - Right atrium: The atrium was severely dilated. - Tricuspid valve: Moderate-severe regurgitation. - Pulmonary arteries: PA peak pressure: 51m Hg (S). - Pericardium, extracardiac: A small pericardial effusion was identified posterior to the heart.    01/24/13 --  TTE Study Conclusions  - Left ventricle:  The cavity size was normal. There was mild concentric hypertrophy. Systolic function was normal. The estimated ejection fraction was in the range of 60% to 65%. Wall motion was normal; there were no regional wall motion abnormalities. Left ventricular diastolic function parameters were normal. - Ventricular septum: The contour showed diastolic flattening and systolic flattening consistent with volume and pressure overload. - Right ventricle: The cavity size was severely dilated. There was moderate hypertrophy. Systolic function was moderately reduced. The estimated peak pressure was 859mHg. - Right atrium: There is significant bowing of the interatrial septum to the left consistent with severely elavated right sided pressures. The atrium was massively dilated. - Tricuspid valve: Moderate regurgitation. - Pulmonary arteries: Systolic pressure was severely increased. PA peak pressure: 8758mg (S). (Stable from 1 year ago)    Impression:  PULMONARY HYPERTENSION, SECONDARY She likely needs some temporary diuresis, but she cannot tell me how much lasix she has been taking. She believes she is still taking the adcirca. She will need a second agent (tracleer and opsumit have not worked). Will try letaris since she wouldn't need freq labs. She will have to call us [place] let us [place] how much lasix she is on, allow us [place] make adjustments. rov 1

## 2013-11-13 ENCOUNTER — Telehealth: Payer: Self-pay | Admitting: Emergency Medicine

## 2013-11-13 NOTE — Telephone Encounter (Signed)
Pt called to give update on medications  Meds are now updated and Letaris application can be sent to company Faxed back to [phone] Pt aware. Pt aware that we will contact her when medication is received.  Nothing further needed.

## 2013-11-22 ENCOUNTER — Telehealth: Payer: Self-pay | Admitting: Emergency Medicine

## 2013-11-22 NOTE — Telephone Encounter (Signed)
PA form received and placed in RB look at. Please advise once done thanks

## 2013-11-22 NOTE — Telephone Encounter (Signed)
lmtcb x1 for mary beth. Do not see PA

## 2013-11-22 NOTE — Telephone Encounter (Signed)
[name] returned call & will re-fax the PA to [phone].  [name]

## 2013-11-23 NOTE — Telephone Encounter (Signed)
Form done

## 2013-11-28 ENCOUNTER — Encounter: Payer: Self-pay | Admitting: Gastroenterology

## 2013-11-28 ENCOUNTER — Ambulatory Visit (INDEPENDENT_AMBULATORY_CARE_PROVIDER_SITE_OTHER): Payer: Commercial Managed Care - HMO | Admitting: Gastroenterology

## 2013-11-28 VITALS — BP 102/70 | HR 68 | Ht 64.0 in | Wt 171.8 lb

## 2013-11-28 DIAGNOSIS — R197 Diarrhea, unspecified: Secondary | ICD-10-CM

## 2013-11-28 NOTE — Assessment & Plan Note (Signed)
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  At this point she's not having frank diarrhea.  I've advised her to continue fiber supplementation and to call back if diarrheal symptoms worsen, at which point I would try her on lialda again.  I am still unsure whether in fact she did take lialda when first diagnosed.

## 2013-11-28 NOTE — Patient Instructions (Addendum)
Take Benefiber daily supplement and call back if your symptoms have not improved.

## 2013-11-28 NOTE — Progress Notes (Signed)
      History of Present Illness:  Ms. Croghan has returned following colonoscopy for evaluation of diarrhea.  She occasionally has spots of stool in her underclothes.  Symptoms are improved.  Colonoscopy in July, 2015 was unremarkable except for AVMs.  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  She was prescribed lialda although I am still unclear whether she took this medication.  Currently she is not taking it.    Review of Systems: Pertinent positive and negative review of systems were noted in the above HPI section. All other review of systems were otherwise negative.    Current Medications, Allergies, Past Medical History, Past Surgical History, Family History and Social History were reviewed in [facility] record  Vital signs were reviewed in today's medical record. Physical Exam: General: Well developed , well nourished, no acute distress Skin: anicteric Head: Normocephalic and atraumatic Eyes:  sclerae anicteric, EOMI Ears: Normal auditory acuity Mouth: No deformity or lesions Lungs: Clear throughout to auscultation Heart: Regular rate and rhythm; no murmurs, rubs or bruits Abdomen: Soft, non tender and non distended. No masses, hepatosplenomegaly or hernias noted. Normal Bowel sounds Rectal:deferred Musculoskeletal: Symmetrical with no gross deformities  Pulses:  Normal pulses noted Extremities: No clubbing, cyanosis, edema or deformities noted Neurological: Alert oriented x 4, grossly nonfocal Psychological:  Alert and cooperative. Normal mood and affect  See Assessment and Plan under Problem List

## 2013-12-03 ENCOUNTER — Telehealth: Payer: Self-pay | Admitting: Gastroenterology

## 2013-12-03 NOTE — Telephone Encounter (Signed)
She does not call it loose stool or diarrhea. It sounds like she is unaware when it happens exactly. There is no record on her medication list of Lialda, but her pharmacy confirms she got it once 09/25/13. And she says she was on a powder before but "it didn't help".

## 2013-12-03 NOTE — Telephone Encounter (Signed)
She had a loose stools?  Make sure that she is taking Lialda

## 2013-12-03 NOTE — Telephone Encounter (Signed)
Contacted this patient and talked with her at length. It sounds like she has not had any change in her symptoms of fecal incontinence. First she says she is taking the fiber, then she says she is not. She is having "lots of gas" but cannot pass gas with confidence. There is no pain, urgency or blood. Please advise on the plan for this complicated patient.

## 2013-12-04 NOTE — Telephone Encounter (Signed)
It appears that her main problem is stool incontinence.  I would refer her to Dr. Leighton Ruff at [facility] surgery for further evaluation of incontinence.

## 2013-12-04 NOTE — Telephone Encounter (Signed)
Patient agrees to this plan. Records faxed to [facility]. Patient to expect a call from there about the appointment.

## 2013-12-07 ENCOUNTER — Ambulatory Visit (INDEPENDENT_AMBULATORY_CARE_PROVIDER_SITE_OTHER): Payer: Medicare Other | Admitting: Emergency Medicine

## 2013-12-07 ENCOUNTER — Encounter: Payer: Self-pay | Admitting: Emergency Medicine

## 2013-12-07 VITALS — BP 118/62 | HR 77 | Temp 98.6°F | Ht 64.0 in | Wt 168.2 lb

## 2013-12-07 DIAGNOSIS — M349 Systemic sclerosis, unspecified: Secondary | ICD-10-CM

## 2013-12-07 DIAGNOSIS — I2789 Other specified pulmonary heart diseases: Secondary | ICD-10-CM

## 2013-12-07 DIAGNOSIS — Z23 Encounter for immunization: Secondary | ICD-10-CM

## 2013-12-07 NOTE — Patient Instructions (Signed)
Walking oximetry today We will clarify the delivery of your Letaris. We will start the medication once the approval process has been completed.  Continue your Adcirca as you have been taking it Follow with Dr Lamonte Sakai in 3 months or sooner if you have any problems.

## 2013-12-07 NOTE — Progress Notes (Signed)
[name] is a 63 year old woman with scleroderma and severe secondary pulmonary hypertension. She had been treated with bosentan + Coumadin. Initiated [name], Adcirca in the past but unable to tolerate. Finally started on inhaled Tyvaso in 2010. Some delay in titrating up to goal dosing of 9 puffs 4x a day due to compliance issues, but finally able to do so. This was then stopped in Summer 2012 during admission for septic knee hardware.   By TTE, her PASP improved from 102 mmHg (7/09) to 11mHg (9/10), 6 minute walk improved to 3290m11/2010).   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 That hospitalization ultimately resulted in ICU stay for sepsis, c/b renal insufficiency. TTE on June 29 showed estimated RVSP of 63 mmHg (off Tyvaso). We sent her home on bosentan + coumadin, with plans to restart Tyvaso as outpt once she stabilized. She went home on IV Abx.   Readmitted 09/2010 with fever and leukocytosis, S Cr 1.8 - 2.0. Pulm/CCM consulted to assist w her management of PAH medications.  ROV 10/29/10 -- scleroderma, PAH. Has been hospitalized x3 since our last visit due to infected knee hardware. She is off tyvaso. She is planning to go back for repeat L knee surgery this month. She states that her breathing has been going well. She is on Tracleer. She does not want to restart the Tyvaso at this time. Denies dyspnea, syncope, CP.  She is finished with IV abx.   ROV 02/01/11 -- scleroderma, PAH. Remains on coumadin + bosentan but off Tyvaso as above. Last TTE on June 29 showed estimated RVSP of 63 mmHg (off Tyvaso).  She returns for f/u. Tells me that since last visit she underwent f/u knee sgy to replace hardware. She feels it is doing well, now off abx. She is not interested in going back on Tyvaso. Her breathing  seems to be stable, although she isn't really pushing herself.   ROV 06/21/11 -- scleroderma, PAH. Remains on coumadin + bosentan but off Tyvaso. She clinically feels well, has been able to increase her activity but PAP's have increased by TTE 09/11/10. We have planned for repeat TTE in June 2013. Remains on Pred 30m11md for her scleroderma. She asks today about whether a FXa inhibitor would be possible for her, but I don[facility] know of [facility]. Due for LFT's, wants to get them at coumadin clinic if possible, she will call to arrange.   ROV 10/04/11 -- scleroderma, PAH. Remains on coumadin + bosentan but off Tyvaso. TTE on 08/18/11 shows PASP rising, now 830m80m(from 63 08/2010; her best was 42 in 11/2008). Clinically, she feels well - no breathing complaints. Her wt has been stable, trace edema without change. She ambulates and does her usual activities, denies dyspnea.   ROV 12/13/11 -- scleroderma, PAH. Remains on coumadin + bosentan but off Tyvaso. TTE on 08/18/11 shows PASP rising, now 830mm630mfrom 63 08/2010; her best was 42 in 11/2008). Returns today to discuss Tyvaso - She is still against doing it due to the fact that it is time consuming, difficult to do 4-6 times a day. She isn't sure that it helps her breathing. Her TTE showed moderate RV dilation 6/13. Denies SOB, edema or CP.   ROV 10/04/12 -- scleroderma, PAH.  Remains on coumadin + bosentan but off Tyvaso. She has not had her TTE. Has not been compliant with INR checks at [facility]. Last LFT in my records 05/2012. She reports that she has been feeling well. No breathing limitations. She is asking to be off of coumadin, off of Tracleer.   ROV 11/10/12 -- scleroderma, PAH. She has been on Tyvaso before, stopped due to difficulty administering. I stopped her Tracleer and coumadin 7/23 because she wouldn't get her labs checked reliably. She returns and states that she misses the Tracleer >> she has felt weaker, stayed in the bed,  more dyspnea, decreased functional capacity.   ROV 01/17/13 -- scleroderma, PAH. She has been on Tyvaso before, stopped due to difficulty administering. I stopped her bosentan due to non-compliance with LFT's. She missed it so I restarted 62.49m bid 8/29. I didn't restart the coumadin. She returns without any LFT since 11/10/12. I discussed macitentan with her today. I have recommended switching since LFT's don't have to be followed.   ROV 02/21/13 -- hx scleroderma, PAH. Has been on and off several PAH meds and also coumadin as detailed above. We started macitentan last time. She is currently taking at night - 153m She was having nausea and diarrhea at first, then this got better when she switched to taking at night. Her breathing is stable, maybe slightly better.  Her PASP on TTE 01/24/13 > stable at 8766m  ROV 05/29/13 -- follows up for scleroderma, PAH. Has been on and off several PAH meds and also coumadin. I stopped macitentan beginning of March due to persistent diarrhea. Since then she has continued to have diarrhea. Her breathing has worsened since we stopped the macitentan and she has more edema. She stopped lisinopril/HCTZ a week ago by PCP due to hyperkalemia. She is due for repeat TTE in June. C diff was negative. She is taking loperamide prn. Still on reglan.    ROV 06/05/13 -- follow up for [age] woman with scleroderma and PAH. Seen last week for diarrhea, abd pain. We stopped her macitentan 05/14/13 concerned that this was a possible side effect, but the sx have persisted. She has also been retaining more fluid, has gained wt. I gave her lasix x 3 days a week ago. Her S Cr has risen to 1.6 from 1.3.  We stopped reglan last visit. She has still had frequent diarrhea, nausea. Her dyspnea has worsened. The loperamide has helped some.   ROV 06/14/13 -- follow up for [age] woman with scleroderma and PAH. She was hospitalized in late March for decompensated cor pulmonale, diuresed. She feels  significantly better, has lost 40+ lbs with diuresis. In the hospital the Tracleer was stopped and she was started on Adcirca. She feels better, less swelling.   ROV 11/08/13 -- [age] woman w scleroderma (no longer on pred per her report) and secondary PAH. She has been on various PAH meds in the past. Most recently she is on Adcirca. She feels "ok", but admits to being limited w her activity. She has gained 14 lbs and is more SOB.  No hospitalizations.  She is taking care of her 4 grandchildren. She is confused about her meds > can't recall what she's on (ie her lasix dose).   ROV 12/07/13 -- follow up for severe secondary PAH due to scleroderma. She is on Adcirca and was recently planned to start on letaris, although she hasn't gotten it yet. She is having chest tightness, especially after she eats.  He dyspnea is stable.    Hepatic Function Latest Ref Rng 11/08/2013 08/03/2013 06/08/2013  Total Protein 6.0 - 8.3 g/dL 8.5(H) 8.6(H) 7.3  Albumin 3.5 - 5.2 g/dL 3.5 3.7 3.0(L)  AST 0 - 37 U/L 24 20 44(H)  ALT 0 - 35 U/L _0 Alk Phosphatase 39 - 117 U/L 141(H) 145(H) 159(H)  Total Bilirubin 0.2 - 1.2 mg/dL 1.2 0.3 0.8  Bilirubin, Direct 0.0 - 0.3 mg/dL 0.5(H) - -      Exam Filed Vitals:   12/07/13 1118  BP: 118/62  Pulse: 77  Temp: 98.6 F (37 C)  TempSrc: Oral  Height: 5' 4" (1.626 m)  Weight: 168 lb 3.2 oz (76.295 kg)  SpO2: 99%   Gen: Pleasant, well-nourished, in no distress,    ENT: No lesions,  mouth clear,  oropharynx clear, no postnasal drip  Neck: No JVD, no TMG, no carotid bruits  Lungs: No use of accessory muscles, no dullness to percussion, clear without rales or rhonchi  Cardiovascular: RRR, heart sounds normal, no murmur or gallops, no peripheral edema  Musculoskeletal: No deformities, NO pretibial edema (much improved).   Neuro: alert, non focal  Skin: Warm, no lesions or rashes   06/08/13 --  Study Conclusions  - Left ventricle: Flattening of the septum.  The cavity size was normal. Wall thickness was increased in a pattern of mild LVH. The estimated ejection fraction was 50%. - Left atrium: The atrium was mildly dilated. - Right ventricle: The cavity size was moderately to severely dilated. Systolic function was moderately to severely reduced. - Right atrium: The atrium was severely dilated. - Tricuspid valve: Moderate-severe regurgitation. - Pulmonary arteries: PA peak pressure: 51m Hg (S). - Pericardium, extracardiac: A small pericardial effusion was identified posterior to the heart.    01/24/13 --  TTE Study Conclusions  - Left ventricle: The cavity size was normal. There was mild concentric hypertrophy. Systolic function was normal. The estimated ejection fraction was in the range of 60% to 65%. Wall motion was normal; there were no regional wall motion abnormalities. Left ventricular diastolic function parameters were normal. - Ventricular septum: The contour showed diastolic flattening and systolic flattening consistent with volume and pressure overload. - Right ventricle: The cavity size was severely dilated. There was moderate hypertrophy. Systolic function was moderately reduced. The estimated peak pressure was 872mHg. - Right atrium: There is significant bowing of the interatrial septum to the left consistent with severely elavated right sided pressures. The atrium was massively dilated. - Tricuspid valve: Moderate regurgitation. - Pulmonary arteries: Systolic pressure was severely increased. PA peak pressure: 8738mg (S). (Stable from 1 year ago)    Impression:  SCLERODERMA Not currently on pred. Will need to consider restarting in the future.   PULMONARY HYPERTENSION, SECONDARY Will plan to add Letaris to [employer] today.  rov 3

## 2013-12-07 NOTE — Assessment & Plan Note (Signed)
Not currently on pred. Will need to consider restarting in the future.

## 2013-12-07 NOTE — Telephone Encounter (Signed)
Received message from [place] at [place]. Patient is scheduled for an appointment with Dr Marcello Moores on 12/26/13 at 1:30

## 2013-12-07 NOTE — Assessment & Plan Note (Signed)
Will plan to add Letaris to [employer].  Walking oximetry today.  rov 3

## 2013-12-10 ENCOUNTER — Telehealth: Payer: Self-pay | Admitting: Emergency Medicine

## 2013-12-10 NOTE — Telephone Encounter (Signed)
I called the [employer] program at [phone] to check on the status of the order for Letaris for the pt that was faxed on 11-09-13. I was advised that they have been attempting to contact the pt several times without success to discuss options for her since she does not have drug coverage. I verified contact info for the pt and all the numbers matched what we have in her chart. I advised I will try and call the pt and advise her that she needs to contact the [place] program at [phone] asap so she can start this medication.  I ATC home #, NA, no voicemail. I LMTCB x1 on cell number.  Copy of the enrollment form is with Dr. Lamonte Sakai paperwork with [facility].

## 2013-12-11 NOTE — Telephone Encounter (Signed)
Called and spoke to pt. Pt stated she recently spoke with the [employer] program and they are sending her a form to complete and send back to the program and will then hopefully be able to start the medication. Pt stated she will call if any issues arise. Nothing further needed.

## 2013-12-13 ENCOUNTER — Telehealth: Payer: Self-pay | Admitting: Emergency Medicine

## 2013-12-13 NOTE — Telephone Encounter (Signed)
Called and spoke with pt and she stated that she did get the letaris in the mail today but the pt stated that she does not know how to take this medication. Pt is needing to know this and she wanted to make sure that RB wanted her to take it along with the adcirca.  RB please advise. Thanks  Allergies  Allergen Reactions  . Codeine     REACTION: GI upset  . Contrast Media [Iodinated Diagnostic Agents] Hives  . Iohexol      Code: HIVES, Desc: pt breaks out in large hives. needs full premeds, Onset Date: [id number]   . Cephalexin Rash  . Ciprofloxacin Rash    Current Outpatient Prescriptions on File Prior to Visit  Medication Sig Dispense Refill  . aspirin EC 81 MG EC tablet Take 1 tablet (81 mg total) by mouth daily.  30 tablet  3  . esomeprazole (NEXIUM) 40 MG capsule Take by mouth 2 (two) times daily.        . fenofibrate (TRICOR) 48 MG tablet take 1 tablet by mouth once daily  30 tablet  11  . ferrous sulfate ([employer] IRON) 325 (65 FE) MG tablet Take 325 mg by mouth daily with breakfast.      . FLUoxetine (PROZAC) 20 MG capsule take 1 capsule by mouth every morning  30 capsule  2  . furosemide (LASIX) 40 MG tablet Take 0.5 tablets (20 mg total) by mouth 2 (two) times daily.  60 tablet  1  . gabapentin (NEURONTIN) 100 MG capsule Take 1 capsule (100 mg total) by mouth 2 (two) times daily.  60 capsule  11  . glucose blood test strip Check Blood Glucose 2-3 times a day.Dx Code:250.60.  100 each  3  . HYDROcodone-acetaminophen (NORCO) 10-325 MG per tablet Take 1 tablet by mouth every 6 (six) hours as needed for moderate pain.       . ONE TOUCH LANCETS MISC Check Blood Glucose 2-3 times a day.Dx code: 250.60.  200 each  3  . pregabalin (LYRICA) 150 MG capsule Take 1 capsule (150 mg total) by mouth 3 (three) times daily.  90 capsule  5  . Tadalafil, PAH, (ADCIRCA) 20 MG TABS Take 40 mg by mouth daily.       No current facility-administered medications on file prior to visit.

## 2013-12-14 NOTE — Telephone Encounter (Signed)
Called and spoke with pt and she is aware of RB recs.  She stated that she will keep her appt with RB and discuss at her next ov.  Nothing further is needed.

## 2013-12-14 NOTE — Telephone Encounter (Signed)
The letaris is one tablet once a day. And yes she is to take both medications.

## 2013-12-17 ENCOUNTER — Encounter: Payer: Self-pay | Admitting: *Deleted

## 2013-12-26 ENCOUNTER — Ambulatory Visit (INDEPENDENT_AMBULATORY_CARE_PROVIDER_SITE_OTHER): Payer: Medicare Other | Admitting: Internal Medicine

## 2013-12-26 ENCOUNTER — Inpatient Hospital Stay (HOSPITAL_COMMUNITY)
Admission: AD | Admit: 2013-12-26 | Discharge: 2013-12-30 | DRG: 293 | Disposition: A | Discharge status: Home / Self Care | Payer: Medicare Other | Source: Ambulatory Visit | Attending: Internal Medicine | Admitting: Internal Medicine

## 2013-12-26 ENCOUNTER — Inpatient Hospital Stay (HOSPITAL_COMMUNITY): Payer: Medicare Other

## 2013-12-26 ENCOUNTER — Encounter (HOSPITAL_COMMUNITY): Payer: Self-pay | Admitting: *Deleted

## 2013-12-26 ENCOUNTER — Encounter: Payer: Self-pay | Admitting: Internal Medicine

## 2013-12-26 VITALS — BP 110/68 | HR 75 | Temp 98.0°F | Ht 64.0 in | Wt 178.7 lb

## 2013-12-26 DIAGNOSIS — M1990 Unspecified osteoarthritis, unspecified site: Secondary | ICD-10-CM | POA: Diagnosis present

## 2013-12-26 DIAGNOSIS — Z79899 Other long term (current) drug therapy: Secondary | ICD-10-CM

## 2013-12-26 DIAGNOSIS — I5023 Acute on chronic systolic (congestive) heart failure: Secondary | ICD-10-CM | POA: Diagnosis present

## 2013-12-26 DIAGNOSIS — I129 Hypertensive chronic kidney disease with stage 1 through stage 4 chronic kidney disease, or unspecified chronic kidney disease: Secondary | ICD-10-CM | POA: Diagnosis present

## 2013-12-26 DIAGNOSIS — N179 Acute kidney failure, unspecified: Secondary | ICD-10-CM

## 2013-12-26 DIAGNOSIS — I2721 Secondary pulmonary arterial hypertension: Secondary | ICD-10-CM

## 2013-12-26 DIAGNOSIS — I272 Other secondary pulmonary hypertension: Secondary | ICD-10-CM | POA: Diagnosis present

## 2013-12-26 DIAGNOSIS — R0789 Other chest pain: Secondary | ICD-10-CM

## 2013-12-26 DIAGNOSIS — Z9119 Patient's noncompliance with other medical treatment and regimen: Secondary | ICD-10-CM | POA: Diagnosis present

## 2013-12-26 DIAGNOSIS — R197 Diarrhea, unspecified: Secondary | ICD-10-CM

## 2013-12-26 DIAGNOSIS — IMO0002 Reserved for concepts with insufficient information to code with codable children: Secondary | ICD-10-CM

## 2013-12-26 DIAGNOSIS — I50813 Acute on chronic right heart failure: Secondary | ICD-10-CM

## 2013-12-26 DIAGNOSIS — I5189 Other ill-defined heart diseases: Secondary | ICD-10-CM

## 2013-12-26 DIAGNOSIS — I2609 Other pulmonary embolism with acute cor pulmonale: Secondary | ICD-10-CM

## 2013-12-26 DIAGNOSIS — I10 Essential (primary) hypertension: Secondary | ICD-10-CM | POA: Diagnosis present

## 2013-12-26 DIAGNOSIS — I2781 Cor pulmonale (chronic): Secondary | ICD-10-CM | POA: Diagnosis present

## 2013-12-26 DIAGNOSIS — R0602 Shortness of breath: Secondary | ICD-10-CM

## 2013-12-26 DIAGNOSIS — I509 Heart failure, unspecified: Secondary | ICD-10-CM

## 2013-12-26 DIAGNOSIS — E1142 Type 2 diabetes mellitus with diabetic polyneuropathy: Secondary | ICD-10-CM

## 2013-12-26 DIAGNOSIS — R55 Syncope and collapse: Secondary | ICD-10-CM | POA: Diagnosis present

## 2013-12-26 DIAGNOSIS — K219 Gastro-esophageal reflux disease without esophagitis: Secondary | ICD-10-CM | POA: Diagnosis present

## 2013-12-26 DIAGNOSIS — M349 Systemic sclerosis, unspecified: Secondary | ICD-10-CM | POA: Diagnosis present

## 2013-12-26 DIAGNOSIS — N183 Chronic kidney disease, stage 3 (moderate): Secondary | ICD-10-CM | POA: Diagnosis present

## 2013-12-26 DIAGNOSIS — Z96652 Presence of left artificial knee joint: Secondary | ICD-10-CM | POA: Diagnosis present

## 2013-12-26 DIAGNOSIS — I50810 Right heart failure, unspecified: Secondary | ICD-10-CM

## 2013-12-26 DIAGNOSIS — E785 Hyperlipidemia, unspecified: Secondary | ICD-10-CM | POA: Diagnosis present

## 2013-12-26 DIAGNOSIS — I959 Hypotension, unspecified: Secondary | ICD-10-CM | POA: Diagnosis present

## 2013-12-26 DIAGNOSIS — E162 Hypoglycemia, unspecified: Secondary | ICD-10-CM | POA: Diagnosis present

## 2013-12-26 DIAGNOSIS — Z7901 Long term (current) use of anticoagulants: Secondary | ICD-10-CM

## 2013-12-26 DIAGNOSIS — Z7982 Long term (current) use of aspirin: Secondary | ICD-10-CM | POA: Diagnosis not present

## 2013-12-26 DIAGNOSIS — E11649 Type 2 diabetes mellitus with hypoglycemia without coma: Secondary | ICD-10-CM | POA: Diagnosis present

## 2013-12-26 LAB — COMPLETE METABOLIC PANEL WITH GFR
ALT: 7 U/L (ref 0–35)
AST: 27 U/L (ref 0–37)
Albumin: 3.5 g/dL (ref 3.5–5.2)
Alkaline Phosphatase: 164 U/L — ABNORMAL HIGH (ref 39–117)
BUN: 42 mg/dL — AB (ref 6–23)
CALCIUM: 9.6 mg/dL (ref 8.4–10.5)
CHLORIDE: 108 meq/L (ref 96–112)
CO2: 22 mEq/L (ref 19–32)
Creat: 1.37 mg/dL — ABNORMAL HIGH (ref 0.50–1.10)
GFR, Est African American: 48 mL/min — ABNORMAL LOW
GFR, Est Non African American: 41 mL/min — ABNORMAL LOW
GLUCOSE: 99 mg/dL (ref 70–99)
POTASSIUM: 4.3 meq/L (ref 3.5–5.3)
SODIUM: 145 meq/L (ref 135–145)
TOTAL PROTEIN: 9 g/dL — AB (ref 6.0–8.3)
Total Bilirubin: 1.4 mg/dL — ABNORMAL HIGH (ref 0.3–1.2)

## 2013-12-26 LAB — PROTIME-INR
INR: 1.46 (ref <1.50)
PROTHROMBIN TIME: 17.9 s — AB (ref 11.6–15.2)

## 2013-12-26 LAB — CBC WITH DIFFERENTIAL/PLATELET
BASOS ABS: 0 10*3/uL (ref 0.0–0.1)
BASOS PCT: 0 % (ref 0–1)
EOS ABS: 0.1 10*3/uL (ref 0.0–0.7)
Eosinophils Relative: 2 % (ref 0–5)
HCT: 31.9 % — ABNORMAL LOW (ref 36.0–46.0)
Hemoglobin: 10.8 g/dL — ABNORMAL LOW (ref 12.0–15.0)
Lymphocytes Relative: 28 % (ref 12–46)
Lymphs Abs: 1.3 10*3/uL (ref 0.7–4.0)
MCH: 26.8 pg (ref 26.0–34.0)
MCHC: 33.9 g/dL (ref 30.0–36.0)
MCV: 79.2 fL (ref 78.0–100.0)
MONO ABS: 0.4 10*3/uL (ref 0.1–1.0)
Monocytes Relative: 9 % (ref 3–12)
NEUTROS ABS: 2.9 10*3/uL (ref 1.7–7.7)
Neutrophils Relative %: 61 % (ref 43–77)
Platelets: 143 10*3/uL — ABNORMAL LOW (ref 150–400)
RBC: 4.03 MIL/uL (ref 3.87–5.11)
RDW: 16 % — AB (ref 11.5–15.5)
WBC: 4.7 10*3/uL (ref 4.0–10.5)

## 2013-12-26 LAB — GLUCOSE, CAPILLARY
Glucose-Capillary: 66 mg/dL — ABNORMAL LOW (ref 70–99)
Glucose-Capillary: 103 mg/dL — ABNORMAL HIGH (ref 70–99)
Glucose-Capillary: 53 mg/dL — ABNORMAL LOW (ref 70–99)
Glucose-Capillary: 56 mg/dL — ABNORMAL LOW (ref 70–99)
Glucose-Capillary: 84 mg/dL (ref 70–99)
Glucose-Capillary: 92 mg/dL (ref 70–99)

## 2013-12-26 LAB — TROPONIN I: Troponin I: <0.3 ng/mL (ref <0.30)

## 2013-12-26 LAB — POCT GLYCOSYLATED HEMOGLOBIN (HGB A1C): Hemoglobin A1C: 5.3

## 2013-12-26 LAB — PRO B NATRIURETIC PEPTIDE: PRO B NATRI PEPTIDE: 1194 pg/mL — AB (ref <126)

## 2013-12-26 MED ORDER — FLUOXETINE HCL 20 MG PO CAPS
20.0000 mg | ORAL_CAPSULE | Freq: Every day | ORAL | Status: DC
  Administered 2013-12-26 – 2013-12-30 (×5): 20 mg via ORAL
  Filled 2013-12-26 (×6): qty 1

## 2013-12-26 MED ORDER — TADALAFIL (PAH) 20 MG PO TABS
40.0000 mg | ORAL_TABLET | Freq: Every day | ORAL | Status: DC
  Administered 2013-12-26: 40 mg via ORAL
  Filled 2013-12-26: qty 2

## 2013-12-26 MED ORDER — SODIUM CHLORIDE 0.9 % IJ SOLN
3.0000 mL | Freq: Two times a day (BID) | INTRAMUSCULAR | Status: DC
  Administered 2013-12-26 – 2013-12-29 (×3): 3 mL via INTRAVENOUS

## 2013-12-26 MED ORDER — ACETAMINOPHEN 325 MG PO TABS
650.0000 mg | ORAL_TABLET | Freq: Four times a day (QID) | ORAL | Status: DC | PRN
  Administered 2013-12-27: 650 mg via ORAL
  Filled 2013-12-26: qty 2

## 2013-12-26 MED ORDER — PANTOPRAZOLE SODIUM 40 MG PO TBEC
40.0000 mg | DELAYED_RELEASE_TABLET | Freq: Every day | ORAL | Status: DC
  Administered 2013-12-26 – 2013-12-30 (×5): 40 mg via ORAL
  Filled 2013-12-26 (×5): qty 1

## 2013-12-26 MED ORDER — ASPIRIN EC 81 MG PO TBEC
81.0000 mg | DELAYED_RELEASE_TABLET | Freq: Every day | ORAL | Status: DC
  Administered 2013-12-27 – 2013-12-30 (×4): 81 mg via ORAL
  Filled 2013-12-26 (×5): qty 1

## 2013-12-26 MED ORDER — MILRINONE IN DEXTROSE 20 MG/100ML IV SOLN
0.2500 ug/kg/min | INTRAVENOUS | Status: DC
  Administered 2013-12-26 – 2013-12-28 (×3): 0.25 ug/kg/min via INTRAVENOUS
  Filled 2013-12-26 (×4): qty 100

## 2013-12-26 MED ORDER — PREGABALIN 75 MG PO CAPS
150.0000 mg | ORAL_CAPSULE | Freq: Three times a day (TID) | ORAL | Status: DC
  Administered 2013-12-26: 150 mg via ORAL
  Filled 2013-12-26: qty 6
  Filled 2013-12-26: qty 2

## 2013-12-26 MED ORDER — ENOXAPARIN SODIUM 40 MG/0.4ML ~~LOC~~ SOLN
40.0000 mg | SUBCUTANEOUS | Status: DC
  Administered 2013-12-26 – 2013-12-29 (×4): 40 mg via SUBCUTANEOUS
  Filled 2013-12-26 (×5): qty 0.4

## 2013-12-26 MED ORDER — HYDROCODONE-ACETAMINOPHEN 10-325 MG PO TABS
1.0000 | ORAL_TABLET | Freq: Four times a day (QID) | ORAL | Status: DC | PRN
  Administered 2013-12-26 – 2013-12-30 (×8): 1 via ORAL
  Filled 2013-12-26 (×9): qty 1

## 2013-12-26 MED ORDER — PREGABALIN 75 MG PO CAPS
150.0000 mg | ORAL_CAPSULE | Freq: Three times a day (TID) | ORAL | Status: DC
  Administered 2013-12-27 – 2013-12-30 (×10): 150 mg via ORAL
  Filled 2013-12-26 (×2): qty 6
  Filled 2013-12-26: qty 2
  Filled 2013-12-26 (×7): qty 6

## 2013-12-26 MED ORDER — FENOFIBRATE 54 MG PO TABS
54.0000 mg | ORAL_TABLET | Freq: Every day | ORAL | Status: DC
  Administered 2013-12-26 – 2013-12-30 (×5): 54 mg via ORAL
  Filled 2013-12-26 (×5): qty 1

## 2013-12-26 MED ORDER — TADALAFIL (PAH) 20 MG PO TABS
20.0000 mg | ORAL_TABLET | Freq: Every day | ORAL | Status: DC
  Administered 2013-12-27 – 2013-12-28 (×2): 20 mg via ORAL
  Filled 2013-12-26 (×2): qty 1

## 2013-12-26 MED ORDER — ACETAMINOPHEN 650 MG RE SUPP: 650.0000 mg | Freq: Four times a day (QID) | RECTAL | Status: DC | PRN

## 2013-12-26 MED ORDER — TADALAFIL (PAH) 20 MG PO TABS: 40.0000 mg | ORAL_TABLET | Freq: Every day | ORAL | Status: DC

## 2013-12-26 MED ORDER — FUROSEMIDE 10 MG/ML IJ SOLN
80.0000 mg | Freq: Two times a day (BID) | INTRAMUSCULAR | Status: DC
  Administered 2013-12-26 – 2013-12-28 (×4): 80 mg via INTRAVENOUS
  Filled 2013-12-26 (×6): qty 8

## 2013-12-26 NOTE — Progress Notes (Signed)
Hypoglycemic Event  CBG: 66  Treatment: OJ and Crackers given  Symptoms: Asymptomatic  Follow-up CBG: Time:1144AM CBG Result:103  Possible Reasons for Event: Pt did not eat breakfast.  Comments/MD notified Yes    [facility]  Remember to initiate Hypoglycemia Order Set & complete

## 2013-12-26 NOTE — Assessment & Plan Note (Signed)
Admitting to hospital for worsening SOB, decreased urine output and weight gain, concern for acute exacerbation of HF vs. Worsening of known pulmonary hypertension.  Likely will need consults with heart failure team and pulmonary.

## 2013-12-26 NOTE — H&P (Signed)
Date: 12/26/2013               Patient Name:  [name] MRN: [id number]  DOB: 15-Sep-1950 Age / Sex: 63 y.o., female   PCP: Sid Falcon, MD         Medical Service: Internal Medicine Teaching Service         Attending Physician: Dr. Annia Belt, MD    First Contact: MS4 Jake Bathe Pager: [phone]  Second Contact: Dr. Joni Reining Pager: [phone]       After Hours (After 5p/  First Contact Pager: [phone]  weekends / holidays): Second Contact Pager: [phone]   Chief Complaint: SOB  History of Present Illness: MARVEL SAPP is a 63 yo AAF with PMH of scleroderma with severe secondary pulmonary HTN and right heart failure, T2DM, GERD.  She presented to the [facility] today accompanied by her twin sister.  She reports she has generally been feeling bad for the past few weeks however over the last number of days she reports she has been "picking up a lot of fluid" especially in her legs.  In addition she notes that she has had some increased SOB especially with exertion.  She does report some chest tightness and notes that she feels like she is being choked when she lays flat.  The patient is a poor historian and is difficult to obtain much more history.  She reports she is taking Lasix 34m once a day.  Her sister confirms this much of the story and is not sure what she takes for medications as she lives close but no with her sister.  She does report that a few days ago she went shopping with her sister and her Becci became light headed and nearly passed out.  She says this has happened before but not frequently and she has been begging her sister to come in and be seen about it.   Meds: No current facility-administered medications for this encounter.   Current Outpatient Prescriptions  Medication Sig Dispense Refill  . aspirin EC 81 MG EC tablet Take 1 tablet (81 mg total) by mouth daily.  30 tablet  3  . esomeprazole (NEXIUM) 40 MG capsule Take by mouth 2 (two) times daily.          . fenofibrate (TRICOR) 48 MG tablet take 1 tablet by mouth once daily  30 tablet  11  . ferrous sulfate ([employer] IRON) 325 (65 FE) MG tablet Take 325 mg by mouth daily with breakfast.      . FLUoxetine (PROZAC) 20 MG capsule take 1 capsule by mouth every morning  30 capsule  2  . furosemide (LASIX) 40 MG tablet Take 0.5 tablets (20 mg total) by mouth 2 (two) times daily.  60 tablet  1  . gabapentin (NEURONTIN) 100 MG capsule Take 1 capsule (100 mg total) by mouth 2 (two) times daily.  60 capsule  11  . glucose blood test strip Check Blood Glucose 2-3 times a day.Dx Code:250.60.  100 each  3  . HYDROcodone-acetaminophen (NORCO) 10-325 MG per tablet Take 1 tablet by mouth every 6 (six) hours as needed for moderate pain.       . ONE TOUCH LANCETS MISC Check Blood Glucose 2-3 times a day.Dx code: 250.60.  200 each  3  . pregabalin (LYRICA) 150 MG capsule Take 1 capsule (150 mg total) by mouth 3 (three) times daily.  90 capsule  5  . Tadalafil, PAH, (ADCIRCA) 20  MG TABS Take 40 mg by mouth daily.        Allergies: Allergies as of 12/26/2013 - Review Complete 12/26/2013  Allergen Reaction Noted  . Codeine    . Contrast media [iodinated diagnostic agents] Hives 08/03/2011  . Iohexol  03/04/2008  . Cephalexin Rash 05/01/2010  . Ciprofloxacin Rash 07/23/2010   Past Medical History  Diagnosis Date  . Secondary pulmonary hypertension     right heart cath 04/20/04  . Cough   . Allergic rhinitis, cause unspecified   . Esophageal reflux   . Systemic sclerosis   . Unspecified essential hypertension   . Gastritis   . Sickle cell trait     "trace"  . Obesity   . Visual changes   . Scleroderma   . Diastolic dysfunction   . Trichomonas   . Vaginal bleeding   . Neuropathy   . CHF (congestive heart failure)   . Type II diabetes mellitus     DIET CONTROL   . Bursitis   . History of blood transfusion     "think it was related to when I had partial hysteretomy; might have been for one of my knee  ORs"  . Degeneration of lumbar or lumbosacral intervertebral disc   . Arthritis     "all over my body" (08/04/2013)  . Chronic kidney disease     "been in hospital for it; don't know what kind" (08/04/2013)   Past Surgical History  Procedure Laterality Date  . Tubal ligation    . Esophagogastroduodenoscopy  02/23/2010    multiple  . Shoulder arthroscopy Right 12/2009    subacromial decompression  . Replacement total knee Left 11/2000  . Cardiac catheterization Right 04/24/2004  . Tonsillectomy  1960's  . Abdominal hysterectomy  1983    "partial"  . Joint replacement    . Shoulder arthroscopy w/ rotator cuff repair Right 01/2010  . Excisional total knee arthroplasty with antibiotic spacers Left 08/2010    "got infected & had to take 1st replacement out"  . Revision total knee arthroplasty Left 11/2010    "removed spacers; replaced knee"  . Total knee revision with scar debridement/patella revision with poly exchange Left 12/2010    fell; knee split opened; had to redo revision"  . Cardiac catheterization Left 07/2010  . Peripherally inserted central catheter insertion  09/2010  . Colonoscopy  09/06/2008    normal----Dr. Lajoyce Corners    Family History  Problem Relation Age of Onset  . Heart disease Mother   . Diabetes Mother   . Diabetes Sister    History   Social History  . Marital Status: Divorced    Spouse Name: N/A    Number of Children: 2  . Years of Education: 11   Occupational History  . Umemployed   . Disability     Social History Main Topics  . Smoking status: Never Smoker   . Smokeless tobacco: Never Used  . Alcohol Use: No  . Drug Use: No  . Sexual Activity: Not on file   Other Topics Concern  . Not on file   Social History Narrative    FAMILY HISTORY:  Significant for coronary artery disease and diabetes   Patient lives at home with granddaughter.    Patient has 2 children.    Patient has 11 years of education.    Patient is on disability.    Patient is right  handed.    Patient is separated.      Review of Systems:  Review of Systems  Constitutional: Positive for malaise/fatigue. Negative for fever and chills.  Eyes: Negative for blurred vision.  Respiratory: Positive for cough (occasional) and shortness of breath. Negative for hemoptysis, sputum production and wheezing.   Cardiovascular: Positive for leg swelling. Negative for chest pain.  Gastrointestinal: Negative for heartburn, nausea, vomiting and abdominal pain.  Genitourinary: Negative for dysuria and frequency.  Neurological: Negative for headaches.  Psychiatric/Behavioral: Negative for substance abuse.  All other systems reviewed and are negative.    Physical Exam: Blood pressure 130/60, pulse 77, temperature 98.2 F (36.8 C), temperature source Oral, resp. rate 20, height _0  (1.626 m), weight 182 lb 3.2 oz (82.645 kg), SpO2 99.00%. Physical Exam  Nursing note and vitals reviewed. Constitutional: She is oriented to person, place, and time. No distress.  Chronically ill appearing  HENT:  Head: Normocephalic and atraumatic.  Cardiovascular: Normal rate and regular rhythm.   Murmur (2/6 systolic heard best over 4ICS LSB) heard. +JVD to angle of jaw  Pulmonary/Chest: No respiratory distress. She has no wheezes. She has rales (bilateral basilar).  Abdominal: Soft. Bowel sounds are normal. She exhibits distension. There is no tenderness. There is no rebound and no guarding.  Musculoskeletal: She exhibits edema (2+ bilaterally).  Neurological: She is alert and oriented to person, place, and time.  Skin: Skin is warm and dry.  Psychiatric: Affect normal.     Lab results: Basic Metabolic Panel:  Recent Labs  12/26/13 1210  NA 145  K 4.3  CL 108  CO2 22  GLUCOSE 99  BUN 42*  CREATININE 1.37*  CALCIUM 9.6   Liver Function Tests:  Recent Labs  12/26/13 1210  AST 27  ALT 7  ALKPHOS 164*  BILITOT 1.4*  PROT 9.0*  ALBUMIN 3.5   No results found for this  basename: LIPASE, AMYLASE,  in the last 72 hours No results found for this basename: AMMONIA,  in the last 72 hours CBC:  Recent Labs  12/26/13 1210  WBC 4.7  NEUTROABS 2.9  HGB 10.8*  HCT 31.9*  MCV 79.2  PLT 143*   Cardiac Enzymes:  Recent Labs  12/26/13 1210  TROPONINI <0.30   BNP:  Recent Labs  12/26/13 1210  PROBNP 1194*   D-Dimer: No results found for this basename: DDIMER,  in the last 72 hours CBG:  Recent Labs  12/26/13 1043 12/26/13 1143  GLUCAP 66* 103*   Hemoglobin A1C:  Recent Labs  12/26/13 1057  HGBA1C 5.3   Fasting Lipid Panel: No results found for this basename: CHOL, HDL, LDLCALC, TRIG, CHOLHDL, LDLDIRECT,  in the last 72 hours Thyroid Function Tests: No results found for this basename: TSH, T4TOTAL, FREET4, T3FREE, THYROIDAB,  in the last 72 hours Anemia Panel: No results found for this basename: VITAMINB12, FOLATE, FERRITIN, TIBC, IRON, RETICCTPCT,  in the last 72 hours Coagulation: No results found for this basename: LABPROT, INR,  in the last 72 hours Urine Drug Screen: Drugs of Abuse     Component Value Date/Time   LABOPIA POSITIVE* 10/02/2010 1128   COCAINSCRNUR NONE DETECTED 10/02/2010 1128   LABBENZ NONE DETECTED 10/02/2010 1128   AMPHETMU NONE DETECTED 10/02/2010 1128   THCU NONE DETECTED 10/02/2010 [place] DETECTED 10/02/2010 1128    Alcohol Level: No results found for this basename: ETH,  in the last 72 hours Urinalysis: No results found for this basename: COLORURINE, APPERANCEUR, LABSPEC, PHURINE, GLUCOSEU, HGBUR, BILIRUBINUR, KETONESUR, PROTEINUR, UROBILINOGEN, NITRITE, LEUKOCYTESUR,  in the last 23  hours   Imaging results:  No results found.  Other results: EKG:NSR, RVH, low voltage, nonspecific T wave changes improved from previous  Assessment & Plan by Problem:   Acute on chronic systolic right heart failure - Admit to inpatient - IV lasix 5m x1 - Consult HF team>> appreciated recs>> IV lasix  824mBID, Milrinone drip - Strict I&O -Daily weights - Trend CE - Monitor electrolytes    Secondary pulmonary hypertension -Patient with history of noncompliance, unclear if taking full dose tadalafil (4037mwith recent addition of 5mg71mbrisentan - Resume tadalafil 20mg13mly and titrate up to 40mg 17mnsider pulm consult in AM    DM type 2 with diabetic peripheral neuropathy with hypoglycemia reportedly not on any home medications however hypoglycemic in clinic and on arrival to floor.  Will continue to monitor CBG, if patient becomes hypoglycemic again will obtain BMP, Insulin, C-Peptide and sulfonylurea panel STAT  -Lyrica for pain (will hold gabapentin, not sure why on both)  CKD Stage 3:suspect baseline Scr 1.2-1.5 - Current SCr 1.3 -Will monitor with diuresis  VTE ppx: Lovenox Dispo: Disposition is deferred at this time, awaiting improvement of current medical problems. Anticipated discharge in approximately 3 day(s).   The patient does have a current PCP (EmilySid Falconand [facility].  The patient does not know have transportation limitations that hinder transportation to clinic appointments.  Signed: Devera Englander CLucious Groves0/14/2015, 1:47 PM

## 2013-12-26 NOTE — Progress Notes (Signed)
Hypoglycemic Event  CBG: 53  Treatment: Orange Juice and Crackers  Symptoms: Asymptomatic  Follow-up CBG: Time:1533 CBG Result: 84  Possible Reasons for Event: Pt. Hasn't eaten yet today.  Comments/MD notified: MD notified    [name]  Remember to initiate Hypoglycemia Order Set & complete

## 2013-12-26 NOTE — H&P (Signed)
Date: 12/26/2013               Patient Name:  [name] MRN: [id number]  DOB: [birth year] Age / Sex: 63 y.o., female   PCP: Sid Falcon, MD              Medical Service: Internal Medicine Teaching Service              Attending Physician: Dr. Annia Belt, MD    First Contact: Jake Bathe, MS 4 Pager: [phone]  Second Contact: Dr. Joni Reining Pager: [phone]       After Hours (After 5p/  First Contact Pager: [phone]  weekends / holidays): Second Contact Pager: [phone]   Chief Complaint: fluid retention  History of Present Illness: Windi Toro is a 63 yo F with history of pulmonary HTN secondary to scleroderma, Diastolic dysfunction secondary to R heart failure, Type 2 Diabetes, and GERD/gastritis who presented to the Internal medicine clinic with fluid retention. History was difficult to obtain as patient is a poor historian and is not willing to answer some questions. According to past records, her weight is currently up 10lbs over 3 weeks ago. She reports only taking her Lasix 4m once a day although she knows it is prescribed as twice a day. She reports compliance with the remainder of her medications. She complains of increased swelling in her legs over the past couple weeks. She also reports that it feels like someone is choking her when she lies flat, but denies PND. She also reports chest tightness after she eats, and SOB when she moves around. She states the SOB and chest tightness are unrelated and occur at different times. She otherwise denies chest pain or diaphoresis. No other acute complaints.  Her sister states the patient almost passed out at [employer] last week and had to lay down. She states this does not happen frequently.  Meds: No current facility-administered medications for this encounter.    Allergies: Allergies as of 12/26/2013 - Review Complete 12/26/2013  Allergen Reaction Noted  . Codeine    . Contrast media [iodinated diagnostic agents]  Hives 08/03/2011  . Iohexol  03/04/2008  . Cephalexin Rash 05/01/2010  . Ciprofloxacin Rash 07/23/2010   Past Medical History  Diagnosis Date  . Secondary pulmonary hypertension     right heart cath 04/20/04  . Cough   . Allergic rhinitis, cause unspecified   . Esophageal reflux   . Systemic sclerosis   . Unspecified essential hypertension   . Gastritis   . Sickle cell trait     "trace"  . Obesity   . Visual changes   . Scleroderma   . Diastolic dysfunction   . Trichomonas   . Vaginal bleeding   . Neuropathy   . CHF (congestive heart failure)   . Type II diabetes mellitus     DIET CONTROL   . Bursitis   . History of blood transfusion     "think it was related to when I had partial hysteretomy; might have been for one of my knee ORs"  . Degeneration of lumbar or lumbosacral intervertebral disc   . Arthritis     "all over my body" (08/04/2013)  . Chronic kidney disease     "been in hospital for it; don't know what kind" (08/04/2013)   Past Surgical History  Procedure Laterality Date  . Tubal ligation    . Esophagogastroduodenoscopy  02/23/2010    multiple  . Shoulder arthroscopy Right 12/2009  subacromial decompression  . Replacement total knee Left 11/2000  . Cardiac catheterization Right 04/24/2004  . Tonsillectomy  1960's  . Abdominal hysterectomy  1983    "partial"  . Joint replacement    . Shoulder arthroscopy w/ rotator cuff repair Right 01/2010  . Excisional total knee arthroplasty with antibiotic spacers Left 08/2010    "got infected & had to take 1st replacement out"  . Revision total knee arthroplasty Left 11/2010    "removed spacers; replaced knee"  . Total knee revision with scar debridement/patella revision with poly exchange Left 12/2010    fell; knee split opened; had to redo revision"  . Cardiac catheterization Left 07/2010  . Peripherally inserted central catheter insertion  09/2010  . Colonoscopy  09/06/2008    normal----Dr. Lajoyce Corners    Family History    Problem Relation Age of Onset  . Heart disease Mother   . Diabetes Mother   . Diabetes Sister    History   Social History  . Marital Status: Divorced    Spouse Name: N/A    Number of Children: 2  . Years of Education: 11   Occupational History  . Umemployed   . Disability     Social History Main Topics  . Smoking status: Never Smoker   . Smokeless tobacco: Never Used  . Alcohol Use: No  . Drug Use: No  . Sexual Activity: Not on file   Other Topics Concern  . Not on file   Social History Narrative    FAMILY HISTORY:  Significant for coronary artery disease and diabetes   Patient lives at home with granddaughter.    Patient has 2 children.    Patient has 11 years of education.    Patient is on disability.    Patient is right handed.    Patient is separated.      Review of Systems: Constitutional: No fevers, chills. +fatigue Cardiac: No palpitations Genitourinary: decreased urinary frequency GI: no abdominal pain  Physical Exam: There were no vitals taken for this visit. Constitutional: No acute distress, well nourished Head: Normocephalic and atraumatic  Eyes: PERRL, EOMI, conjunctivae normal, no scleral icterus.  Neck: Supple, trachea midline Cardiovascular: regular rate, regular rhythm, no MRG Pulmonary/Chest: normal respiratory effort, CTAB, no wheezes, rales, or rhonchi  Abdominal: Soft, non-distended, no tenderness to palpation. bowel sounds are normal Neurological: A&O x3, cranial nerve II-XII are grossly intact, no focal motor deficit Skin: Warm, dry and intact.  Lab results: Results for orders placed in visit on 12/26/13 (from the past 24 hour(s))  GLUCOSE, CAPILLARY     Status: Abnormal   Collection Time    12/26/13 10:43 AM      Result Value Ref Range   Glucose-Capillary 66 (*) 70 - 99 mg/dL   Comment 1 Notify RN    POCT GLYCOSYLATED HEMOGLOBIN (HGB A1C)     Status: None   Collection Time    12/26/13 10:57 AM      Result Value Ref Range    Hemoglobin A1C 5.3    GLUCOSE, CAPILLARY     Status: Abnormal   Collection Time    12/26/13 11:43 AM      Result Value Ref Range   Glucose-Capillary 103 (*) 70 - 99 mg/dL   Comment 1 Documented in Chart    COMPLETE METABOLIC PANEL WITH GFR     Status: Abnormal   Collection Time    12/26/13 12:10 PM      Result Value Ref Range  Sodium 145  135 - 145 mEq/L   Potassium 4.3  3.5 - 5.3 mEq/L   Chloride 108  96 - 112 mEq/L   CO2 22  19 - 32 mEq/L   Glucose, Bld 99  70 - 99 mg/dL   BUN 42 (*) 6 - 23 mg/dL   Creat 1.37 (*) 0.50 - 1.10 mg/dL   Total Bilirubin 1.4 (*) 0.3 - 1.2 mg/dL   Alkaline Phosphatase 164 (*) 39 - 117 U/L   AST 27  0 - 37 U/L   ALT 7  0 - 35 U/L   Total Protein 9.0 (*) 6.0 - 8.3 g/dL   Albumin 3.5  3.5 - 5.2 g/dL   Calcium 9.6  8.4 - 10.5 mg/dL   GFR, Est African American 48 (*)    GFR, Est Non African American 41 (*)    Narrative:    Performed at:  [facility]                [place], [place] [address]  PRO B NATRIURETIC PEPTIDE     Status: Abnormal   Collection Time    12/26/13 12:10 PM      Result Value Ref Range   Pro B Natriuretic peptide (BNP) 1194 (*) <126 pg/mL   Narrative:    Performed at:  [facility]                [place], [place] [address]  CBC WITH DIFFERENTIAL     Status: Abnormal   Collection Time    12/26/13 12:10 PM      Result Value Ref Range   WBC 4.7  4.0 - 10.5 K/uL   RBC 4.03  3.87 - 5.11 MIL/uL   Hemoglobin 10.8 (*) 12.0 - 15.0 g/dL   HCT 31.9 (*) 36.0 - 46.0 %   MCV 79.2  78.0 - 100.0 fL   MCH 26.8  26.0 - 34.0 pg   MCHC 33.9  30.0 - 36.0 g/dL   RDW 16.0 (*) 11.5 - 15.5 %   Platelets 143 (*) 150 - 400 K/uL   Neutrophils Relative % 61  43 - 77 %   Neutro Abs 2.9  1.7 - 7.7 K/uL   Lymphocytes Relative 28  12 - 46 %   Lymphs Abs 1.3  0.7 - 4.0 K/uL   Monocytes Relative 9  3 - 12 %   Monocytes Absolute 0.4  0.1 - 1.0 K/uL   Eosinophils  Relative 2  0 - 5 %   Eosinophils Absolute 0.1  0.0 - 0.7 K/uL   Basophils Relative 0  0 - 1 %   Basophils Absolute 0.0  0.0 - 0.1 K/uL   Smear Review Criteria for review not met     Narrative:    Performed at:  [facility]                [place], [place] [address]  TROPONIN I     Status: None   Collection Time    12/26/13 12:10 PM      Result Value Ref Range   Troponin I <0.30  <0.30 ng/mL  Narrative:    Performed at:  [facility]                [place], [place] [address]  PROTIME-INR     Status: Abnormal   Collection Time    12/26/13 12:10 PM      Result Value Ref Range   Prothrombin Time 17.9 (*) 11.6 - 15.2 seconds   INR 1.46  <1.50   Narrative:    Performed at:  [facility]                [place], [place] [address]    Imaging results:  No results found.  Other results: EKG: Normal rate and rhythm, R axis deviation, RVH, RBBB, T wave inversion  Assessment & Plan by Problem: Principal Problem:   Acute on chronic systolic right heart failure Active Problems:   DM type 2 with diabetic peripheral neuropathy   Essential hypertension   Secondary pulmonary hypertension   SCLERODERMA  Acute R-sided CHF: Patient presents with LE swelling for the past 1-2 weeks as well as orthopnea. Pro-BNP elevated at 1194. Patient reports to taking less than her prescribed Lasix dosage. Leg swelling is symmetrical and nontender, thus low suspicion for DVT. Patient does not report increased SOB at rest, and it is unlikely her dyspnea represents a progression of her chronic lung disease given associated with leg swelling and orthopnea. -IV Lasix 20m daily  -Heart failure team consulted -Strict I&O -Daily weights -Recheck CMP tomorrow morning, monitory renal function -Trend troponins  Chest tightness: Atypical symptoms with pain worsening after eating. Patient  has history of chronic chest discomfort, likely related to known GERD vs. Scleroderma. EKG changes are nonspecific and initial cardiac enzymes are negative. -Trend troponins -Continue home PPI -continue aspirin 820mdaily  Hypoglycemia: [name] glucose was found to be 66 in clinic, likely secondary to poor oral intake.  -Patient given glucose load per protocol -Recheck blood sugar  Pulmonary HTN -Continue home Tadalafil  Type 2 DM: Diet controlled  CKD: Cr 1.37 on admission (Baseline creatinine ~1.5) -Continue to monitor  GERD: Patient complains of chest tightness after eating, possibly related to GERD vs Scleroderma. She is on a PPI at home  -Continue home meds   FEN  -Diet: heart healthy  VTE prophylaxis  -Lovenox  This is a [employer] information officerote.  The care of the patient was discussed with Dr. HoHeber Carolinand the assessment and plan was formulated with their assistance.  Please see their note for official documentation of the patient encounter.   Signed: DiCiro BackerMed Student 12/26/2013, 2:33 PM

## 2013-12-26 NOTE — H&P (Signed)
  I have seen and examined the patient myself, and I have reviewed the note by Jake Bathe, MS 4 and was present during the interview and physical exam.  Please see my separate H&P for additional findings, assessment, and plan.   Signed: Lucious Groves, DO 12/26/2013, 7:33 PM

## 2013-12-26 NOTE — Consult Note (Signed)
Advanced Heart Failure Team History and Physical Note   Primary Physician: Internal Medicine Primary Cardiologist:  Dr. Harrington Challenger Primary Pulmonologist: Dr. Lamonte Sakai   Reason for Admission: A/C HF   HPI:    Asked to consult by Internal Medicine for RHF.  [name] is a 63 yo female with a history of DM2, HLD, HTN, pHTN, GERD, gastroparesis, scleroderma, DJD and diastolic HF.   Of note she has been on several PAH targeted therapies and coumadin over last 9 years, but has not been reliably on therapy due to noncompliance and inability to have labs performed. She has had best response to Tyvaso (+ bosentan) with an improvement in 6 minute walk and in PASP from 102 mmHg (7/09) to 42 mmHg (9/10). Since stopping Tyvaso her PASP has risen to 80's by TTE 6/13 and 11/14. Surprisingly, she has never required supplemental O2. Most recent efforts have been to start macitentan in 11/14. She developed nausea and diarrhea and macitentan was stopped early 3/15. Her diarrhea continued (C diff negative)  Was admitted in 3/15 with severe RHF, marked volume overload and renal failure. She was started on milrinone and IV lasix and diuresed 36 pounds. Weight on d/c was 146. At time of discharge we gave her samples of Adcirca and she was supposed to f/u with Dr. Lamonte Sakai to obtain outpatient meds.  Saw Dr. Lamonte Sakai on 12/07/13. Reported she was taking Adcirca and plan was to start Letairis. Weight at that time was 168 pounds  She is a poor historian and not exactly sure what medications she takes. Presented to PCP appointment today with complaints of DOE, increase weight gain and pre-syncope. She reports taking only lasix 20 mg daily, however not prescribed dose. Weight at appt 178 lbs.    ECHO: (06/08/13): EF 50%, RV mod/severely dilated and sys fx mod/severely reduced .      Review of Systems: [y] = yes, _0  = no   General: Weight gain [Y ]; Weight loss _1 ; Anorexia _2 ; Fatigue [Y ]; Fever _3 ; Chills _4 ; Weakness _5    Cardiac: Chest pain/pressure [Y ]; Resting SOB [Y ]; Exertional SOB [Y ]; Orthopnea [ Y]; Pedal Edem [Y ]; Palpitations _6 ; Syncope _7 ; Presyncope [Y ]; Paroxysmal nocturnal dyspnea_8   Pulmonary: Cough _9 ; Wheezing_10 ; Hemoptysis_11 ; Sputum _12 ; Snoring _13   GI: Vomiting_14 ; Dysphagia_15 ; Melena_16 ; Hematochezia _17 ; Heartburn_18 ; Abdominal pain _19 ; Constipation _20 ; Diarrhea _21 ; BRBPR _22   GU: Hematuria_23 ; Dysuria _24 ; Nocturia_25   Vascular: Pain in legs with walking _26 ; Pain in feet with lying flat _27 ; Non-healing sores _28 ; Stroke _29 ; TIA _30 ; Slurred speech _31 ;  Neuro: Headaches_32 ; Vertigo_33 ; Seizures_34 ; Paresthesias_35 ;Blurred vision _36 ; Diplopia _37 ; Vision changes _38   Ortho/Skin: Arthritis _39 ; Joint pain [ Y]; Muscle pain _40 ; Joint swelling _41 ; Back Pain _42 ; Rash _43   Psych: Depression_44 ; Anxiety_45   Heme: Bleeding problems _46 ; Clotting disorders _47 ; Anemia _48   Endocrine: Diabetes _49 ; Thyroid dysfunction_50   Home Medications Prior to Admission medications   Medication Sig Start Date End Date Taking? Authorizing Provider  aspirin EC 81 MG EC tablet Take 1 tablet (81 mg total) by mouth daily. 06/14/13   Rande Brunt, NP  esomeprazole (NEXIUM) 40 MG capsule Take by mouth 2 (two) times daily.  Historical Provider, MD  fenofibrate (TRICOR) 48 MG tablet take 1 tablet by mouth once daily 04/11/13   Sid Falcon, MD  ferrous sulfate ([facility] IRON) 325 (65 FE) MG tablet Take 325 mg by mouth daily with breakfast.    Historical Provider, MD  FLUoxetine (PROZAC) 20 MG capsule take 1 capsule by mouth every morning 10/17/13   Sid Falcon, MD  furosemide (LASIX) 40 MG tablet Take 0.5 tablets (20 mg total) by mouth 2 (two) times daily. 09/12/13   Sid Falcon, MD  gabapentin (NEURONTIN) 100 MG capsule Take 1 capsule (100 mg total) by mouth 2 (two) times daily. 08/17/13   Dennie Bible, NP  glucose blood test strip Check Blood Glucose 2-3 times a day.Dx Code:250.60.     Sid Falcon, MD  HYDROcodone-acetaminophen [facility]) 10-325 MG per tablet Take 1 tablet by mouth every 6 (six) hours as needed for moderate pain.  07/17/12   Historical Provider, MD  ONE TOUCH LANCETS MISC Check Blood Glucose 2-3 times a day.Dx code: 250.60.    Sid Falcon, MD  pregabalin (LYRICA) 150 MG capsule Take 1 capsule (150 mg total) by mouth 3 (three) times daily. 08/17/13   Dennie Bible, NP  Tadalafil, PAH, (ADCIRCA) 20 MG TABS Take 40 mg by mouth daily.    Historical Provider, MD    Past Medical History: Past Medical History  Diagnosis Date  . Secondary pulmonary hypertension     right heart cath 04/20/04  . Cough   . Allergic rhinitis, cause unspecified   . Esophageal reflux   . Systemic sclerosis   . Unspecified essential hypertension   . Gastritis   . Sickle cell trait     "trace"  . Obesity   . Visual changes   . Scleroderma   . Diastolic dysfunction   . Trichomonas   . Vaginal bleeding   . Neuropathy   . CHF (congestive heart failure)   . Type II diabetes mellitus     DIET CONTROL   . Bursitis   . History of blood transfusion     "think it was related to when I had partial hysteretomy; might have been for one of my knee ORs"  . Degeneration of lumbar or lumbosacral intervertebral disc   . Arthritis     "all over my body" (08/04/2013)  . Chronic kidney disease     "been in hospital for it; don't know what kind" (08/04/2013)    Past Surgical History: Past Surgical History  Procedure Laterality Date  . Tubal ligation    . Esophagogastroduodenoscopy  02/23/2010    multiple  . Shoulder arthroscopy Right 12/2009    subacromial decompression  . Replacement total knee Left 11/2000  . Cardiac catheterization Right 04/24/2004  . Tonsillectomy  1960's  . Abdominal hysterectomy  1983    "partial"  . Joint replacement    . Shoulder arthroscopy w/ rotator cuff repair Right 01/2010  . Excisional total knee arthroplasty with antibiotic spacers Left 08/2010     "got infected & had to take 1st replacement out"  . Revision total knee arthroplasty Left 11/2010    "removed spacers; replaced knee"  . Total knee revision with scar debridement/patella revision with poly exchange Left 12/2010    fell; knee split opened; had to redo revision"  . Cardiac catheterization Left 07/2010  . Peripherally inserted central catheter insertion  09/2010  . Colonoscopy  09/06/2008    normal----Dr. Lajoyce Corners     Family History:  Family History  Problem Relation Age of Onset  . Heart disease Mother   . Diabetes Mother   . Diabetes Sister     Social History: History   Social History  . Marital Status: Divorced    Spouse Name: N/A    Number of Children: 2  . Years of Education: 11   Occupational History  . Umemployed   . Disability     Social History Main Topics  . Smoking status: Never Smoker   . Smokeless tobacco: Never Used  . Alcohol Use: No  . Drug Use: No  . Sexual Activity: Not on file   Other Topics Concern  . Not on file   Social History Narrative    FAMILY HISTORY:  Significant for coronary artery disease and diabetes   Patient lives at home with granddaughter.    Patient has 2 children.    Patient has 11 years of education.    Patient is on disability.    Patient is right handed.    Patient is separated.      Allergies:  Allergies  Allergen Reactions  . Codeine     REACTION: GI upset  . Contrast Media [Iodinated Diagnostic Agents] Hives  . Iohexol      Code: HIVES, Desc: pt breaks out in large hives. needs full premeds, Onset Date: [id number]   . Cephalexin Rash  . Ciprofloxacin Rash    Objective:    Vital Signs:   Temp:  [97.6 F (36.4 C)-98 F (36.7 C)] 97.6 F (36.4 C) (10/14 1433) Pulse Rate:  [67-75] 67 (10/14 1433) BP: (110-126)/(68-76) 126/76 mmHg (10/14 1433) SpO2:  [100 %] 100 % (10/14 1433) Weight:  [178 lb 11.2 oz (81.058 kg)] 178 lb 11.2 oz (81.058 kg) (10/14 1037)   There were no vitals filed for this  visit.  Physical Exam: General:  Chronically ill appearing. No resp difficulty HEENT: normal Neck: supple. JVP to ear . Carotids 2+ bilat; no bruits. No lymphadenopathy or thryomegaly appreciated. Cor: PMI laterally displaced, Regular rate & rhythm. +RV lift, 3/6 TR and Loud P2 Lungs: clear Abdomen: soft, mildly tender, +++ distended. Liver edge down 3 cm; No bruits or masses. Good bowel sounds. Extremities: no cyanosis, clubbing, rash, 2+ bilateral edema Neuro: alert & orientedx3, cranial nerves grossly intact. moves all 4 extremities w/o difficulty. Affect pleasant  Telemetry: SR  Labs: Basic Metabolic Panel:  Recent Labs Lab 12/26/13 1210  NA 145  K 4.3  CL 108  CO2 22  GLUCOSE 99  BUN 42*  CREATININE 1.37*  CALCIUM 9.6    Liver Function Tests:  Recent Labs Lab 12/26/13 1210  AST 27  ALT 7  ALKPHOS 164*  BILITOT 1.4*  PROT 9.0*  ALBUMIN 3.5   No results found for this basename: LIPASE, AMYLASE,  in the last 168 hours No results found for this basename: AMMONIA,  in the last 168 hours  CBC:  Recent Labs Lab 12/26/13 1210  WBC 4.7  NEUTROABS 2.9  HGB 10.8*  HCT 31.9*  MCV 79.2  PLT 143*    Cardiac Enzymes:  Recent Labs Lab 12/26/13 1210  TROPONINI <0.30    BNP: BNP (last 3 results)  Recent Labs  06/09/13 0825 09/12/13 1121 12/26/13 1210  PROBNP 2707.0* 1481* 1194*    CBG:  Recent Labs Lab 12/26/13 1043 12/26/13 1143 12/26/13 1440 12/26/13 1500  GLUCAP 66* 103* 53* 56*    Coagulation Studies:  Recent Labs  12/26/13 1210  LABPROT 17.9*  INR  1.46    Other results: EKG: SR 71. RVH. Non-specific ST-T wave abnormalities. Low volts   Imaging:  No results found.      Assessment:   1. A/C RHF 2. PAH with cor pulmonale  3. Scleroderma  4. DM  5. Small pericardial effusion  6. Noncompliance  7. CKD, stage III - baseline Cr 1.6   Plan/Discussion:    She presents with decompensated/end-stage RHF with marked  volume overload in setting of severe PAH and medication noncompliance.  Very difficult situation. Patient is not quite sure what her medications are and I am not sure if she is really taking her medications. In March we were able to diurese her well with IV lasix and milrinone will restart both, lasix 80 mg IV BID and milrinone 0.25 mcg.   Would restart Adcirca 20 mg (1/2 dose) and titrate up. Will consult pulmonary.    She has very limited insight into her situation and combined with her lack of compliance and home support, she is not a candidate for IV therapies.  Could consider EMS paramedicne consult.     Length of Stay: 0 Rande Brunt NP-C 12/26/2013, 3:29 PM  Advanced Heart Failure Team Pager [phone] (M-F; 7a - 4p)  Please contact [facility] for night-coverage after hours (4p -7a ) and weekends on [email or web address]  Patient seen and examined with Junie Bame, NP. We discussed all aspects of the encounter. I agree with the assessment and plan as stated above.   She has decompensated RHF with marked volume overload. It is hard to know what meds she is taking and what she is not. I suspect she has not been compliant with her PAH meds. We will start IV milrinone and IV lasix. She has about 35 pounds of fluid on her. Agree with restarting Adcirca. She is not candidate for IV epoprostenol due to noncompliance.  Quillian Quince Bensimhon,MD 4:47 PM

## 2013-12-26 NOTE — Progress Notes (Signed)
Subjective:    Patient ID: [name], female    DOB: [birth year], 63 y.o.   MRN: [id number]  CC: SOB  HPI  [name] is a 63yo  Woman with PMH of ILD, scleroderma, Cor pulmonale, DM2 (not on medications), GERD, HTN.    Today, [name] complains of SOB and also presents with low blood sugar.  SOB is across her chest, every since her legs swelled up.  SOB more after she eats, gets tight in her chest.  Improves with rest and time.  Also SOB with walking, improved with rest.  Swelling in her LE is a little worse than a few months ago.  Belly swelling seems about the same.  She is taking Letairis, Dr. Harrington Challenger suggested she see Dr. Sung Amabile.  She denies orthopnea or PND.  She has gained 10 pounds since last check 9/25. She reports sometimes going all day without urinating.  She only urinates about once a day.  She is taking 9m of the lasix once a day.  Her sister joined her in the exam room and noted that she had been feeling very fatigued and passing out at home as well as having chest pain with activity, which [name] to tell me.  When I layed her down on the exam table for the exam, she became short of breath.  I asked her again if she was short of breath with lying flat, and she admitted that she had been for a few weeks.   When she came in to the clinic, her BS was checked and she had a blood sugar of 66.  She complained of feeling tired and sleepy.  She is not on any medications for her diabetes.   No dysphagia, heartburn, pain in chest, fevers, chills, nausea, vomiting, abdominal pain, constipation or diarrhea.     Review of Systems  Constitutional: Positive for fatigue. Negative for fever and chills.  HENT: Negative for ear discharge, ear pain and sinus pressure.   Eyes: Negative for photophobia and visual disturbance.  Respiratory: Positive for chest tightness and shortness of breath. Negative for cough, choking and wheezing.   Cardiovascular: Positive for chest pain and  leg swelling. Negative for palpitations.       + orthopnea  Gastrointestinal: Positive for abdominal distention (swelling, reports unchanged from last visit. ). Negative for abdominal pain, diarrhea, constipation and blood in stool.  Genitourinary: Positive for decreased urine volume. Negative for dysuria and difficulty urinating.       Decreased frequency  Musculoskeletal: Negative for arthralgias and back pain.  Skin: Negative for rash and wound.  Neurological: Positive for syncope and light-headedness. Negative for dizziness, weakness and numbness.       Objective:   Physical Exam  Constitutional: She is oriented to person, place, and time.  Ms. KSiaappears stated age and well developed.  She does appear fatigued today, but this is not out of proportion to normal.  She is very reticent in her answers and only on repeated questioning was the above history elicited.    HENT:  Head: Normocephalic and atraumatic.  Mouth/Throat: No oropharyngeal exudate.  Eyes: Conjunctivae are normal. Pupils are equal, round, and reactive to light. No scleral icterus.  Neck: Neck supple.  Could not appreciate elevated JVD in the clinic, patient could not tolerate lying flat, SOB even at a 30 degree angle  Cardiovascular: Normal rate, regular rhythm, normal heart sounds and intact distal pulses.   No murmur heard. Cool extremities  Pulmonary/Chest: Effort normal and breath sounds normal. No respiratory distress. She has no wheezes.  Crackles at bases  Abdominal: Soft. She exhibits distension. There is tenderness (diffusely, mild). There is no guarding.  Musculoskeletal: She exhibits edema (minimal to 1+ in bilateral LE). She exhibits no tenderness.  Neurological: She is alert and oriented to person, place, and time.  Skin: Skin is dry. No erythema.  Psychiatric: Her behavior is normal.    Stat BMET, pro-BNP, Troponin and CBC today      Assessment & Plan:  Admit to hospital.

## 2013-12-27 DIAGNOSIS — M349 Systemic sclerosis, unspecified: Secondary | ICD-10-CM

## 2013-12-27 DIAGNOSIS — R600 Localized edema: Secondary | ICD-10-CM

## 2013-12-27 DIAGNOSIS — E11649 Type 2 diabetes mellitus with hypoglycemia without coma: Secondary | ICD-10-CM

## 2013-12-27 DIAGNOSIS — E1140 Type 2 diabetes mellitus with diabetic neuropathy, unspecified: Secondary | ICD-10-CM

## 2013-12-27 DIAGNOSIS — I5023 Acute on chronic systolic (congestive) heart failure: Principal | ICD-10-CM

## 2013-12-27 DIAGNOSIS — N183 Chronic kidney disease, stage 3 (moderate): Secondary | ICD-10-CM

## 2013-12-27 LAB — BASIC METABOLIC PANEL
Anion gap: 14 (ref 5–15)
BUN: 41 mg/dL — ABNORMAL HIGH (ref 6–23)
CALCIUM: 9.4 mg/dL (ref 8.4–10.5)
CO2: 22 mEq/L (ref 19–32)
Chloride: 108 mEq/L (ref 96–112)
Creatinine, Ser: 1.34 mg/dL — ABNORMAL HIGH (ref 0.50–1.10)
GFR, EST AFRICAN AMERICAN: 48 mL/min — AB (ref >90)
GFR, EST NON AFRICAN AMERICAN: 41 mL/min — AB (ref >90)
Glucose, Bld: 79 mg/dL (ref 70–99)
Potassium: 4 mEq/L (ref 3.7–5.3)
SODIUM: 144 meq/L (ref 137–147)

## 2013-12-27 LAB — GLUCOSE, CAPILLARY
GLUCOSE-CAPILLARY: 94 mg/dL (ref 70–99)
Glucose-Capillary: 75 mg/dL (ref 70–99)
Glucose-Capillary: 80 mg/dL (ref 70–99)
Glucose-Capillary: 108 mg/dL — ABNORMAL HIGH (ref 70–99)

## 2013-12-27 LAB — TROPONIN I: Troponin I: <0.3 ng/mL (ref <0.30)

## 2013-12-27 NOTE — Progress Notes (Signed)
  I have seen and examined the patient, and reviewed the daily progress note by Jake Bathe, MS 4 and discussed the care of the patient with them. Please see my progress note from 12/27/2013 for further details regarding assessment and plan.    Signed:  Lucious Groves, DO 12/27/2013, 12:04 PM

## 2013-12-27 NOTE — Progress Notes (Signed)
Subjective: Patient reports feeling better, decreased swelling of feet, increased urination. Objective: Vital signs in last 24 hours: Filed Vitals:   12/27/13 0541 12/27/13 0753 12/27/13 0835 12/27/13 1618  BP: 92/48 89/40 102/50 111/68  Pulse:  82  69  Temp:  97.8 F (36.6 C)    TempSrc:  Tympanic    Resp:  19  14  Height:      Weight:  168 lb 6.9 oz (76.4 kg)    SpO2:  94%  95%   Weight change:   Intake/Output Summary (Last 24 hours) at 12/27/13 1736 Last data filed at 12/27/13 1522  Gross per 24 hour  Intake    120 ml  Output   4000 ml  Net  -3880 ml   General: resting in bed Cardiac: RRR, 2/6 systolic murmur Pulm:minimal right basilar rales otherwise CTAB Abd: soft, nontender, nondistended, BS present Ext: warm and well perfused, left leg larger circumference than right, 1-2+ pitting edema bilaterally to knee Neuro: alert and oriented X3 Lab Results: Basic Metabolic Panel:  Recent Labs Lab 12/26/13 1210 12/27/13 0320  NA 145 144  K 4.3 4.0  CL 108 108  CO2 22 22  GLUCOSE 99 79  BUN 42* 41*  CREATININE 1.37* 1.34*  CALCIUM 9.6 9.4   Liver Function Tests:  Recent Labs Lab 12/26/13 1210  AST 27  ALT 7  ALKPHOS 164*  BILITOT 1.4*  PROT 9.0*  ALBUMIN 3.5   No results found for this basename: LIPASE, AMYLASE,  in the last 168 hours No results found for this basename: AMMONIA,  in the last 168 hours CBC:  Recent Labs Lab 12/26/13 1210  WBC 4.7  NEUTROABS 2.9  HGB 10.8*  HCT 31.9*  MCV 79.2  PLT 143*   Cardiac Enzymes:  Recent Labs Lab 12/26/13 1210 12/26/13 2223 12/27/13 0320  TROPONINI <0.30 <0.30 <0.30   BNP:  Recent Labs Lab 12/26/13 1210  PROBNP 1194*   D-Dimer: No results found for this basename: DDIMER,  in the last 168 hours CBG:  Recent Labs Lab 12/26/13 1500 12/26/13 1533 12/26/13 2047 12/27/13 0734 12/27/13 1121 12/27/13 1625  GLUCAP 56* 84 92 75 80 94   Hemoglobin A1C:  Recent Labs Lab 12/26/13 1057   HGBA1C 5.3   Fasting Lipid Panel: No results found for this basename: CHOL, HDL, LDLCALC, TRIG, CHOLHDL, LDLDIRECT,  in the last 168 hours Thyroid Function Tests: No results found for this basename: TSH, T4TOTAL, FREET4, T3FREE, THYROIDAB,  in the last 168 hours Coagulation:  Recent Labs Lab 12/26/13 1210  LABPROT 17.9*  INR 1.46   Anemia Panel: No results found for this basename: VITAMINB12, FOLATE, FERRITIN, TIBC, IRON, RETICCTPCT,  in the last 168 hours Urine Drug Screen: Drugs of Abuse     Component Value Date/Time   LABOPIA POSITIVE* 10/02/2010 1128   COCAINSCRNUR NONE DETECTED 10/02/2010 1128   LABBENZ NONE DETECTED 10/02/2010 1128   AMPHETMU NONE DETECTED 10/02/2010 1128   THCU NONE DETECTED 10/02/2010 1128   LABBARB NONE DETECTED 10/02/2010 1128    Alcohol Level: No results found for this basename: ETH,  in the last 168 hours Urinalysis: No results found for this basename: COLORURINE, APPERANCEUR, LABSPEC, PHURINE, GLUCOSEU, HGBUR, BILIRUBINUR, KETONESUR, PROTEINUR, UROBILINOGEN, NITRITE, LEUKOCYTESUR,  in the last 168 hours  Micro Results: No results found for this or any previous visit (from the past 240 hour(s)). Studies/Results: Dg Chest 2 View  12/26/2013   CLINICAL DATA:  Chest pain. Shortness of breath. Interstitial lung  disease.  EXAM: CHEST  2 VIEW  COMPARISON:  06/07/2013.  FINDINGS: Severe enlargement of the cardiopericardial silhouette, which is consistent with both cardiomegaly and a small pericardial effusion. Pulmonary vascular congestion is present. No airspace consolidation. There is no effusion identified. Monitoring leads project over the chest. Interstitial pulmonary edema is present with Awanda Mink B lines at the periphery of the lung on the frontal view.  IMPRESSION: 1. Chronic severe cardiomegaly, interstitial pulmonary edema and mild CHF. 2. No interval change or acute abnormality.   Electronically Signed   By: Dereck Ligas M.D.   On: 12/26/2013 15:33     Medications: I have reviewed the patient's current medications. Scheduled Meds: . aspirin EC  81 mg Oral Daily  . enoxaparin (LOVENOX) injection  40 mg Subcutaneous Q24H  . fenofibrate  54 mg Oral Daily  . FLUoxetine  20 mg Oral Daily  . furosemide  80 mg Intravenous BID  . pantoprazole  40 mg Oral Daily  . pregabalin  150 mg Oral TID  . sodium chloride  3 mL Intravenous Q12H  . Tadalafil (PAH)  20 mg Oral Q2000   Continuous Infusions: . 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 - Symptomatically improving with decrease swelling of lower extremities however is asymmetric.  Will rule out DVT with LE dopplers -Currently on milrinone with IV lasix 28m BID, however with some low BP readings overnight.  Appreciated HF assistance but may need lasix drip versus smaller bolus.    DM type 2 with diabetic peripheral neuropathy/  Hypoglycemia - Well controlled without medication, no further episodes of hypoglycemia since admission -CBG AC, HS, 3am    Essential hypertension - Mildly hypotensive overnight, will need to be careful about BP in setting of severe R heart dysfunction    Secondary pulmonary hypertension - Continue tadalafil 257m Dispo: Disposition is deferred at this time, awaiting improvement of current medical problems.  Anticipated discharge in approximately 2 day(s).   The patient does have a current PCP (ESid FalconMD) and [facility].  The patient does not know have transportation limitations that hinder transportation to clinic appointments.  .Services Needed at time of discharge: Y = Yes, Blank = No PT:   OT:   RN:   Equipment:   Other:     LOS: 1 day   Pamela GrovesDO 12/27/2013, 5:36 PM

## 2013-12-27 NOTE — Progress Notes (Signed)
Subjective: [name] has no complaints this morning. Reports decreased leg swelling. Denies dizziness. More interactive this morning   Objective: Vital signs in last 24 hours: Filed Vitals:   12/27/13 0531 12/27/13 0541 12/27/13 0753 12/27/13 0835  BP: 86/40 92/48 89/40 102/50  Pulse: 67  82   Temp: 98.4 F (36.9 C)  97.8 F (36.6 C)   TempSrc: Oral  Tympanic   Resp: 18  19   Height:      Weight:   76.4 kg (168 lb 6.9 oz)   SpO2: 98%  94%    Weight change: -14lbs today  Intake/Output Summary (Last 24 hours) at 12/27/13 1119 Last data filed at 12/27/13 0836  Gross per 24 hour  Intake    120 ml  Output   2800 ml  Net  -2680 ml   Constitutional: No acute distress, well nourished  Head: Normocephalic and atraumatic Eyes: PERRL, EOMI, conjunctivae normal, no scleral icterus.  Neck: Supple, trachea midline  Cardiovascular: regular rate, regular rhythm, no MRG Pulmonary/Chest: normal respiratory effort, CTAB, no wheezes, minimal crackles at lung bases Abdominal: Soft, non-distended, no tenderness to palpation. bowel sounds are normal Neurological: A&O x3, cranial nerve II-XII are grossly intact, no focal motor deficit  Skin: Warm, dry and intact.  Lab Results: Results for orders placed during the hospital encounter of 12/26/13 (from the past 24 hour(s))  GLUCOSE, CAPILLARY     Status: Abnormal   Collection Time    12/26/13  2:40 PM      Result Value Ref Range   Glucose-Capillary 53 (*) 70 - 99 mg/dL  GLUCOSE, CAPILLARY     Status: Abnormal   Collection Time    12/26/13  3:00 PM      Result Value Ref Range   Glucose-Capillary 56 (*) 70 - 99 mg/dL  GLUCOSE, CAPILLARY     Status: None   Collection Time    12/26/13  3:33 PM      Result Value Ref Range   Glucose-Capillary 84  70 - 99 mg/dL  GLUCOSE, CAPILLARY     Status: None   Collection Time    12/26/13  8:47 PM      Result Value Ref Range   Glucose-Capillary 92  70 - 99 mg/dL  TROPONIN I     Status: None   Collection Time    12/26/13 10:23 PM      Result Value Ref Range   Troponin I <0.30  <0.30 ng/mL  BASIC METABOLIC PANEL     Status: Abnormal   Collection Time    12/27/13  3:20 AM      Result Value Ref Range   Sodium 144  137 - 147 mEq/L   Potassium 4.0  3.7 - 5.3 mEq/L   Chloride 108  96 - 112 mEq/L   CO2 22  19 - 32 mEq/L   Glucose, Bld 79  70 - 99 mg/dL   BUN 41 (*) 6 - 23 mg/dL   Creatinine, Ser 1.34 (*) 0.50 - 1.10 mg/dL   Calcium 9.4  8.4 - 10.5 mg/dL   GFR calc non Af Amer 41 (*) >90 mL/min   GFR calc Af Amer 48 (*) >90 mL/min   Anion gap 14  5 - 15  TROPONIN I     Status: None   Collection Time    12/27/13  3:20 AM      Result Value Ref Range   Troponin I <0.30  <0.30 ng/mL  GLUCOSE, CAPILLARY  Status: None   Collection Time    12/27/13  7:34 AM      Result Value Ref Range   Glucose-Capillary 75  70 - 99 mg/dL  GLUCOSE, CAPILLARY     Status: None   Collection Time    12/27/13 11:21 AM      Result Value Ref Range   Glucose-Capillary 80  70 - 99 mg/dL   Studies/Results: Dg Chest 2 View  12/26/2013   CLINICAL DATA:  Chest pain. Shortness of breath. Interstitial lung disease.  EXAM: CHEST  2 VIEW  COMPARISON:  06/07/2013.  FINDINGS: Severe enlargement of the cardiopericardial silhouette, which is consistent with both cardiomegaly and a small pericardial effusion. Pulmonary vascular congestion is present. No airspace consolidation. There is no effusion identified. Monitoring leads project over the chest. Interstitial pulmonary edema is present with Awanda Mink B lines at the periphery of the lung on the frontal view.  IMPRESSION: 1. Chronic severe cardiomegaly, interstitial pulmonary edema and mild CHF. 2. No interval change or acute abnormality.   Electronically Signed   By: Dereck Ligas M.D.   On: 12/26/2013 15:33   Medications: I have reviewed the patient's current medications. Scheduled Meds: . aspirin EC  81 mg Oral Daily  . enoxaparin (LOVENOX) injection  40 mg  Subcutaneous Q24H  . fenofibrate  54 mg Oral Daily  . FLUoxetine  20 mg Oral Daily  . furosemide  80 mg Intravenous BID  . pantoprazole  40 mg Oral Daily  . pregabalin  150 mg Oral TID  . sodium chloride  3 mL Intravenous Q12H  . Tadalafil (PAH)  20 mg Oral Q2000   Continuous Infusions: . milrinone 0.25 mcg/kg/min (12/27/13 0843)   PRN Meds:.acetaminophen, acetaminophen, HYDROcodone-acetaminophen Assessment/Plan: Principal Problem:   Acute on chronic systolic right heart failure Active Problems:   DM type 2 with diabetic peripheral neuropathy   Essential hypertension   Secondary pulmonary hypertension   SCLERODERMA   Hypoglycemia   PAH (pulmonary artery hypertension)   Acute cor pulmonale   RVF (right ventricular failure)   Near syncope  Acute on chronic systolic right heart failure: Weight down -14 lbs today. Leg swelling improved, however L leg more swollen than R with associated tightness in calf. BP running low at 86/40 last night, improved to 088 systolic this morning. Cr stable at 1.3 - IV lasix 53m BID per HF team. Continue to monitor BP closesly - Milrinone drip  - bilateral LE dopplers to r/o DVT - Strict I&O  - Daily weights  - Trend Creatinine - Monitor electrolytes   Secondary pulmonary hypertension: Has history of medication noncompliance - Tadalafil 241mdaily and titrate up to 401m DM type 2 with diabetic peripheral neuropathy with hypoglycemia: hypoglycemia improved, no further episodes. Blood glucose well controlled without medication. -Lyrica for neuropathic pain  CKD Stage 3: Baseline Cr ~1.2-1.5, Stable today. -Continue to monitor  VTE ppx: Lovenox   This is a [employer] information officerte.  The care of the patient was discussed with Dr. HofHeber Carolinad the assessment and plan formulated with their assistance.  Please see their attached note for official documentation of the daily encounter.   LOS: 1 day   DiaCiro Backered Student 12/27/2013, 11:19  AM

## 2013-12-27 NOTE — Progress Notes (Signed)
Advanced Heart Failure Rounding Note   Subjective:    Asked to consult by Internal Medicine for RHF.   [name] is a 63 yo female with a history of DM2, HLD, HTN, pHTN, GERD, gastroparesis, scleroderma, DJD and diastolic HF.   Of note she has been on several PAH targeted therapies and coumadin over last 9 years, but has not been reliably on therapy due to noncompliance and inability to have labs performed. She has had best response to Tyvaso (+ bosentan) with an improvement in 6 minute walk and in PASP from 102 mmHg (7/09) to 42 mmHg (9/10). Since stopping Tyvaso her PASP has risen to 80's by TTE 6/13 and 11/14. Surprisingly, she has never required supplemental O2. Most recent efforts have been to start macitentan in 11/14. She developed nausea and diarrhea and macitentan was stopped early 3/15. Her diarrhea continued (C diff negative)   Was admitted in 3/15 with severe RHF, marked volume overload and renal failure. She was started on milrinone and IV lasix and diuresed 36 pounds. Weight on d/c was 146. At time of discharge we gave her samples of Adcirca and she was supposed to f/u with Dr. Lamonte [name] to obtain outpatient meds.  Saw Dr. Lamonte [name] on 12/07/13. Reported she was taking Adcirca and plan was to start Letairis. Weight at that time was 168 pounds   She is a poor historian and not exactly sure what medications she takes. Presented to PCP appointment 10/14 with complaints of DOE, increase weight gain and pre-syncope. Started on IV lasix and milrinone. Weight down 8 lbs (?), 24 hr I/O -1.8 liters. Renal function stable.    ECHO: (06/08/13): EF 50%, RV mod/severely dilated and sys fx mod/severely reduced    Objective:   Weight Range:  Vital Signs:   Temp:  [97.6 F (36.4 C)-98.4 F (36.9 C)] 97.8 F (36.6 C) (10/15 0753) Pulse Rate:  [67-82] 82 (10/15 0753) Resp:  [18-21] 19 (10/15 0753) BP: (86-130)/(40-80) 102/50 mmHg (10/15 0835) SpO2:  [94 %-100 %] 94 % (10/15 0753) Weight:  [168 lb  6.9 oz (76.4 kg)-182 lb 3.2 oz (82.645 kg)] 168 lb 6.9 oz (76.4 kg) (10/15 0753) Last BM Date: 12/25/13  Weight change: Filed Weights   12/26/13 1700 12/26/13 1707 12/27/13 0753  Weight: 178 lb 12.7 oz (81.1 kg) 182 lb 3.2 oz (82.645 kg) 168 lb 6.9 oz (76.4 kg)    Intake/Output:   Intake/Output Summary (Last 24 hours) at 12/27/13 0859 Last data filed at 12/27/13 0836  Gross per 24 hour  Intake    120 ml  Output   2800 ml  Net  -2680 ml     Physical Exam: General: Chronically ill appearing. No resp difficulty  HEENT: normal  Neck: supple. JVP to ear . Carotids 2+ bilat; no bruits. No lymphadenopathy or thryomegaly appreciated.  Cor: PMI laterally displaced, Regular rate & rhythm. +RV lift, 3/6 TR and Loud P2  Lungs: clear  Abdomen: soft, mildly tender, +++ distended. Liver edge down 3 cm; No bruits or masses. Good bowel sounds.  Extremities: no cyanosis, clubbing, rash, 2+ bilateral edema  Neuro: alert & orientedx3, cranial nerves grossly intact. moves all 4 extremities w/o difficulty. Affect pleasant  Telemetry: SR  Labs: Basic Metabolic Panel:  Recent Labs Lab 12/26/13 1210 12/27/13 0320  NA 145 144  K 4.3 4.0  CL 108 108  CO2 22 22  GLUCOSE 99 79  BUN 42* 41*  CREATININE 1.37* 1.34*  CALCIUM 9.6 9.4  Liver Function Tests:  Recent Labs Lab 12/26/13 1210  AST 27  ALT 7  ALKPHOS 164*  BILITOT 1.4*  PROT 9.0*  ALBUMIN 3.5   No results found for this basename: LIPASE, AMYLASE,  in the last 168 hours No results found for this basename: AMMONIA,  in the last 168 hours  CBC:  Recent Labs Lab 12/26/13 1210  WBC 4.7  NEUTROABS 2.9  HGB 10.8*  HCT 31.9*  MCV 79.2  PLT 143*    Cardiac Enzymes:  Recent Labs Lab 12/26/13 1210 12/26/13 2223 12/27/13 0320  TROPONINI <0.30 <0.30 <0.30    BNP: BNP (last 3 results)  Recent Labs  06/09/13 0825 09/12/13 1121 12/26/13 1210  PROBNP 2707.0* 1481* 1194*     Other results:  EKG:    Imaging: Dg Chest 2 View  12/26/2013   CLINICAL DATA:  Chest pain. Shortness of breath. Interstitial lung disease.  EXAM: CHEST  2 VIEW  COMPARISON:  06/07/2013.  FINDINGS: Severe enlargement of the cardiopericardial silhouette, which is consistent with both cardiomegaly and a small pericardial effusion. Pulmonary vascular congestion is present. No airspace consolidation. There is no effusion identified. Monitoring leads project over the chest. Interstitial pulmonary edema is present with Awanda Mink B lines at the periphery of the lung on the frontal view.  IMPRESSION: 1. Chronic severe cardiomegaly, interstitial pulmonary edema and mild CHF. 2. No interval change or acute abnormality.   Electronically Signed   By: Dereck Ligas M.D.   On: 12/26/2013 15:33      Medications:     Scheduled Medications: . aspirin EC  81 mg Oral Daily  . enoxaparin (LOVENOX) injection  40 mg Subcutaneous Q24H  . fenofibrate  54 mg Oral Daily  . FLUoxetine  20 mg Oral Daily  . furosemide  80 mg Intravenous BID  . pantoprazole  40 mg Oral Daily  . pregabalin  150 mg Oral TID  . sodium chloride  3 mL Intravenous Q12H  . Tadalafil (PAH)  20 mg Oral Q2000     Infusions: . milrinone 0.25 mcg/kg/min (12/27/13 0843)     PRN Medications:  acetaminophen, acetaminophen, HYDROcodone-acetaminophen   Assessment:   1. A/C RHF  2. PAH with cor pulmonale  3. Scleroderma  4. DM  5. Small pericardial effusion  6. Noncompliance  7. CKD, stage III - baseline Cr 1.6   Plan/Discussion:    Presented with decompensated/end-stage RHF with marked volume overload in setting of severe PAH and medication noncompliance.  Started on lasix 80 mg IV BID with milrinone 0.25 mcg. Weight is down, however not sure if accurate. She remains volume overloaded, will continue current plan.  Adcirca restarted 20 mg daily. Unfortunately not a candidate for IV epoprostenol due to noncompliance.  Length of Stay: 1 Pamela Brunt NP-C 12/27/2013, 8:59 AM  Advanced Heart Failure Team Pager [phone] (M-F; 7a - 4p)  Please contact [facility] for night-coverage after hours (4p -7a ) and weekends on [email or web address]  Patient seen and examined with Pamela Bame, NP. We discussed all aspects of the encounter. I agree with the assessment and plan as stated above. She is responding well to IV lasix and milrinone. She still has marked volume overload. Will continue diuresis.  [name] 5:39 PM

## 2013-12-27 NOTE — Progress Notes (Signed)
VASCULAR LAB PRELIMINARY  PRELIMINARY  PRELIMINARY  PRELIMINARY  Bilateral lower extremity venous Dopplers completed.    Preliminary report:  There is no DVT or SVT noted in the bilateral lower extremities.  Waveforms are pulsatile suggestive of fluid overload.   Geraline Halberstadt, RVT 12/27/2013, 3:08 PM

## 2013-12-27 NOTE — Progress Notes (Signed)
Utilization review completed

## 2013-12-27 NOTE — H&P (Signed)
Attending physician admission note: I personally interviewed and examined this patient, reviewed the lab data base, electrocardiogram, and pertinent radiographs and I concur with the evaluation and management plan as recorded by resident physician Dr. Johnnette Gourd.  Clinical summary: 63 year old woman with pulmonary hypertension and chronic right-sided heart failure felt to be secondary to underlying scleroderma. She has an identical twin sister who is unaffected. She stopped taking her Lasix about 5 days ago and now presents with increasing dyspnea, orthopnea, chest tightness, and leg swelling occurring over the last 2 weeks. Most recent echocardiogram done 06/08/2013 showed mild left ventricular hypertrophy with estimated ejection fraction 50%. Moderate to severe dilatation of the right ventricle with moderate to severe reduction in the right heart systolic function, severe right atrial dilatation, moderate to severe tricuspid regurgitation, and a peak pulmonary artery pressure of 97 mm. A small posterior pericardial effusion noted as well. She has been taking Tadalafil 40 mg daily. A new endothelin receptor antagonist, Ambrisentan, was just started within the last few weeks. She is followed by pulmonary medicine, Dr. Lamonte Sakai. She is also followed by cardiology.  Initial exam by Dr. Johnnette Gourd showed a African American woman in no distress Blood pressure 130/60, pulse 77 regular, temperature 98.2, respirations 20, oxygen saturation 99% on room air Cardiac exam with a 2/6 systolic murmur at the left sternal border, 2+ bilateral edema of the legs; lung exam with bilateral basilar rales, no wheezes.  Pertinent lab: BUN 42, creatinine 1.4, alkaline phosphatase 164, bilirubin 1.4, total protein 9.0, albumin 3.5, Hemoglobin 10.8, MCV 79, platelets 143,000 Troponin x1 undetectable Pro BNP 1194 with most recent value recorded 1481 on 09/12/2013 with previous value 3360 at time of March 2015  admission  Chest x-ray: Chronic severe cardiomegaly. Mild CHF with interstitial pulmonary edema. No acute change compared with a 06/07/2013 study.  EKG: Sinus rhythm, right axis deviation, prolonged QT interval, nonspecific T-wave changes, no significant change compared with a 08/04/2013 study.   Current exam: Blood pressure 111/68, pulse 69, temperature 97.8 F (36.6 C), temperature source Tympanic, resp. rate 14, height _0  (1.626 m), weight 168 lb 6.9 oz (76.4 kg), SpO2 95.00%. Pleasant African American woman in no distress. No changes on my exam compared with the exam recorded by Dr. Heber Booker.  Impression: #1. Acute on chronic right heart failure We have asked cardiology to evaluate the patient. They have recommended a milrinone infusion. We will resume her diuretics. Continue her other outpatient medications (Taldalifil & ambrisentan). She responded to parenteral diuretics and milrinone during March 2015 admission and the hope is that we can again get temporary stabilization.  #2. Scleroderma  #3. History of Type 2 diabetes with associated peripheral neuropathy but not currently on any hypoglycemic drugs  Murriel Hopper, M.D., [facility]

## 2013-12-28 DIAGNOSIS — I10 Essential (primary) hypertension: Secondary | ICD-10-CM

## 2013-12-28 LAB — GLUCOSE, CAPILLARY
GLUCOSE-CAPILLARY: 83 mg/dL (ref 70–99)
GLUCOSE-CAPILLARY: 109 mg/dL — AB (ref 70–99)
Glucose-Capillary: 119 mg/dL — ABNORMAL HIGH (ref 70–99)
Glucose-Capillary: 88 mg/dL (ref 70–99)

## 2013-12-28 LAB — BASIC METABOLIC PANEL
Anion gap: 15 (ref 5–15)
BUN: 40 mg/dL — ABNORMAL HIGH (ref 6–23)
CHLORIDE: 104 meq/L (ref 96–112)
CO2: 22 meq/L (ref 19–32)
CREATININE: 1.51 mg/dL — AB (ref 0.50–1.10)
Calcium: 9.6 mg/dL (ref 8.4–10.5)
GFR calc non Af Amer: 36 mL/min — ABNORMAL LOW (ref >90)
GFR, EST AFRICAN AMERICAN: 42 mL/min — AB (ref >90)
Glucose, Bld: 79 mg/dL (ref 70–99)
POTASSIUM: 3.9 meq/L (ref 3.7–5.3)
Sodium: 141 mEq/L (ref 137–147)

## 2013-12-28 MED ORDER — FUROSEMIDE 40 MG PO TABS
40.0000 mg | ORAL_TABLET | Freq: Two times a day (BID) | ORAL | Status: DC
  Administered 2013-12-29 – 2013-12-30 (×3): 40 mg via ORAL
  Filled 2013-12-28 (×5): qty 1

## 2013-12-28 MED ORDER — MILRINONE IN DEXTROSE 20 MG/100ML IV SOLN
0.1250 ug/kg/min | INTRAVENOUS | Status: DC
  Administered 2013-12-28: 0.25 ug/kg/min via INTRAVENOUS
  Administered 2013-12-29: 0.125 ug/kg/min via INTRAVENOUS
  Filled 2013-12-28: qty 100

## 2013-12-28 NOTE — Progress Notes (Signed)
  I have seen and examined the patient, and reviewed the daily progress note by Jake Bathe, MS 4 and discussed the care of the patient with them. Please see my progress note from 12/28/2013 for further details regarding assessment and plan.    Signed:  Lucious Groves, DO 12/28/2013, 11:42 AM

## 2013-12-28 NOTE — Progress Notes (Signed)
Advanced Heart Failure Rounding Note   Subjective:    Asked to consult by Internal Medicine for RHF.   [name] is a 63 yo female with a history of DM2, HLD, HTN, pHTN, GERD, gastroparesis, scleroderma, DJD and diastolic HF.   Of note she has been on several PAH targeted therapies and coumadin over last 9 years, but has not been reliably on therapy due to noncompliance and inability to have labs performed. She has had best response to Tyvaso (+ bosentan) with an improvement in 6 minute walk and in PASP from 102 mmHg (7/09) to 42 mmHg (9/10). Since stopping Tyvaso her PASP has risen to 80's by TTE 6/13 and 11/14. Surprisingly, she has never required supplemental O2. Most recent efforts have been to start macitentan in 11/14. She developed nausea and diarrhea and macitentan was stopped early 3/15. Her diarrhea continued (C diff negative)   Was admitted in 3/15 with severe RHF, marked volume overload and renal failure. She was started on milrinone and IV lasix and diuresed 36 pounds. Weight on d/c was 146. At time of discharge we gave her samples of Adcirca and she was supposed to f/u with Dr. Lamonte Sakai to obtain outpatient meds.  Saw Dr. Lamonte Sakai on 12/07/13. Reported she was taking Adcirca and plan was to start Letairis. Weight at that time was 168 pounds   She is a poor historian and not exactly sure what medications she takes. Presented to PCP appointment 10/14 with complaints of DOE, increase weight gain and pre-syncope.  Remains on IV lasix and milrinone 0.25 mcg. Weight down 5 lbs and 24 hr I/O -3.5 liters. Breathing much improved. Denies CP. Renal function up 1.3-1.5.   ECHO: (06/08/13): EF 50%, RV mod/severely dilated and sys fx mod/severely reduced    Objective:   Weight Range:  Vital Signs:   Temp:  [97.9 F (36.6 C)-98.5 F (36.9 C)] 97.9 F (36.6 C) (10/16 1109) Pulse Rate:  [69-82] 69 (10/16 1109) Resp:  [14-18] 16 (10/16 1109) BP: (84-115)/(37-68) 92/50 mmHg (10/16 1153) SpO2:   [95 %-98 %] 98 % (10/16 1109) Weight:  [162 lb (73.483 kg)] 162 lb (73.483 kg) (10/16 0441) Last BM Date: 12/27/13  Weight change: Filed Weights   12/26/13 1707 12/27/13 0753 12/28/13 0441  Weight: 182 lb 3.2 oz (82.645 kg) 168 lb 6.9 oz (76.4 kg) 162 lb (73.483 kg)    Intake/Output:   Intake/Output Summary (Last 24 hours) at 12/28/13 1449 Last data filed at 12/28/13 1153  Gross per 24 hour  Intake  288.4 ml  Output   3350 ml  Net -3061.6 ml     Physical Exam: General: Chronically ill appearing. No resp difficulty  HEENT: normal  Neck: supple. JVP 8 . Carotids 2+ bilat; no bruits. No lymphadenopathy or thryomegaly appreciated.  Cor: PMI laterally displaced, Regular rate & rhythm. +RV lift, 3/6 TR and Loud P2  Lungs: clear  Abdomen: soft, non tender, non distended. Liver edge down 3 cm; No bruits or masses. Good bowel sounds.  Extremities: no cyanosis, clubbing, rash, trace bilateral edema  Neuro: alert & orientedx3, cranial nerves grossly intact. moves all 4 extremities w/o difficulty. Affect pleasant  Telemetry: SR  Labs: Basic Metabolic Panel:  Recent Labs Lab 12/26/13 1210 12/27/13 0320 12/28/13 0315  NA 145 144 141  K 4.3 4.0 3.9  CL 108 108 104  CO2 _0 GLUCOSE 99 79 79  BUN 42* 41* 40*  CREATININE 1.37* 1.34* 1.51*  CALCIUM 9.6 9.4 9.6  Liver Function Tests:  Recent Labs Lab 12/26/13 1210  AST 27  ALT 7  ALKPHOS 164*  BILITOT 1.4*  PROT 9.0*  ALBUMIN 3.5   No results found for this basename: LIPASE, AMYLASE,  in the last 168 hours No results found for this basename: AMMONIA,  in the last 168 hours  CBC:  Recent Labs Lab 12/26/13 1210  WBC 4.7  NEUTROABS 2.9  HGB 10.8*  HCT 31.9*  MCV 79.2  PLT 143*    Cardiac Enzymes:  Recent Labs Lab 12/26/13 1210 12/26/13 2223 12/27/13 0320  TROPONINI <0.30 <0.30 <0.30    BNP: BNP (last 3 results)  Recent Labs  06/09/13 0825 09/12/13 1121 12/26/13 1210  PROBNP 2707.0*  1481* 1194*     Other results:  EKG:   Imaging: Dg Chest 2 View  12/26/2013   CLINICAL DATA:  Chest pain. Shortness of breath. Interstitial lung disease.  EXAM: CHEST  2 VIEW  COMPARISON:  06/07/2013.  FINDINGS: Severe enlargement of the cardiopericardial silhouette, which is consistent with both cardiomegaly and a small pericardial effusion. Pulmonary vascular congestion is present. No airspace consolidation. There is no effusion identified. Monitoring leads project over the chest. Interstitial pulmonary edema is present with [name] B lines at the periphery of the lung on the frontal view.  IMPRESSION: 1. Chronic severe cardiomegaly, interstitial pulmonary edema and mild CHF. 2. No interval change or acute abnormality.   Electronically Signed   By: Dereck Ligas M.D.   On: 12/26/2013 15:33     Medications:     Scheduled Medications: . aspirin EC  81 mg Oral Daily  . enoxaparin (LOVENOX) injection  40 mg Subcutaneous Q24H  . fenofibrate  54 mg Oral Daily  . FLUoxetine  20 mg Oral Daily  . furosemide  80 mg Intravenous BID  . pantoprazole  40 mg Oral Daily  . pregabalin  150 mg Oral TID  . sodium chloride  3 mL Intravenous Q12H  . Tadalafil (PAH)  20 mg Oral Q2000    Infusions: . milrinone 0.25 mcg/kg/min (12/28/13 1041)    PRN Medications: acetaminophen, acetaminophen, HYDROcodone-acetaminophen   Assessment:   1. A/C RHF  2. PAH with cor pulmonale  3. Scleroderma  4. DM  5. Small pericardial effusion  6. Noncompliance  7. CKD, stage III - baseline Cr 1.6   Plan/Discussion:    Presented with decompensated/end-stage RHF with marked volume overload in setting of severe PAH and medication noncompliance.  Patient is approaching euvolemia. Weight down another 5 lbs and renal function trending up. SHe has already received this mornings dose of lasix. Will hold afternoon dose and transition to PO lasix 40 mg BID tomorrow. Will decrease milrinone to 0.125 mcg.   Will  discuss with Dr. Haroldine Laws about increasing Adcirca to 40 mg daily.  Unfortunately not a candidate for IV epoprostenol due to noncompliance.  Length of Stay: 2 Rande Brunt NP-C 12/28/2013, 2:49 PM  Advanced Heart Failure Team Pager [phone] (M-F; [place])  Please contact [facility] for night-coverage after hours (4p -7a ) and weekends on [email or web address]  Patient seen and examined with Pamela Bame, NP. We discussed all aspects of the encounter. I agree with the assessment and plan as stated above.   Volume status much improved. Renal function worse. Agree with changing lasix to po. Wean milrinone as tolerated - hopefully off tomorrow. Will increase Adcirca to 40 daily.   Deundre Thong,MD 9:20 PM

## 2013-12-28 NOTE — Progress Notes (Signed)
Subjective: [name] has no complaints this morning. Reports decreased leg swelling. Denies dizziness or lightheadedness.  Objective: Vital signs in last 24 hours: Filed Vitals:   12/27/13 1618 12/27/13 2100 12/28/13 0441 12/28/13 1030  BP: 111/68 1_0  Pulse: 69 82 77   Temp:  98.5 F (36.9 C) 98.3 F (36.8 C)   TempSrc:  Oral Oral   Resp: _1 Height:      Weight:   73.483 kg (162 lb)   SpO2: 95% 97% 96%    Weight change: -4.7 kg (-10 lb 5.8 oz)-14lbs today  Intake/Output Summary (Last 24 hours) at 12/28/13 1109 Last data filed at 12/28/13 0300  Gross per 24 hour  Intake   68.4 ml  Output   2700 ml  Net -2631.6 ml   Constitutional: No acute distress, well nourished  Head: Normocephalic and atraumatic Eyes: PERRL, EOMI, conjunctivae normal, no scleral icterus.  Neck: Supple, trachea midline  Cardiovascular: regular rate, regular rhythm, no MRG Pulmonary/Chest: normal respiratory effort, CTAB, no wheezes, minimal crackles at lung bases Abdominal: Soft, non-distended, no tenderness to palpation. bowel sounds are normal Neurological: A&O x3, cranial nerve II-XII are grossly intact, no focal motor deficit  Skin: Warm, dry and intact.  Lab Results: Results for orders placed during the hospital encounter of 12/26/13 (from the past 24 hour(s))  GLUCOSE, CAPILLARY     Status: None   Collection Time    12/27/13 11:21 AM      Result Value Ref Range   Glucose-Capillary 80  70 - 99 mg/dL  GLUCOSE, CAPILLARY     Status: None   Collection Time    12/27/13  4:25 PM      Result Value Ref Range   Glucose-Capillary 94  70 - 99 mg/dL  GLUCOSE, CAPILLARY     Status: Abnormal   Collection Time    12/27/13  9:38 PM      Result Value Ref Range   Glucose-Capillary 108 (*) 70 - 99 mg/dL  BASIC METABOLIC PANEL     Status: Abnormal   Collection Time    12/28/13  3:15 AM      Result Value Ref Range   Sodium 141  137 - 147 mEq/L   Potassium 3.9  3.7 - 5.3 mEq/L     Chloride 104  96 - 112 mEq/L   CO2 22  19 - 32 mEq/L   Glucose, Bld 79  70 - 99 mg/dL   BUN 40 (*) 6 - 23 mg/dL   Creatinine, Ser 1.51 (*) 0.50 - 1.10 mg/dL   Calcium 9.6  8.4 - 10.5 mg/dL   GFR calc non Af Amer 36 (*) >90 mL/min   GFR calc Af Amer 42 (*) >90 mL/min   Anion gap 15  5 - 15  GLUCOSE, CAPILLARY     Status: None   Collection Time    12/28/13  7:49 AM      Result Value Ref Range   Glucose-Capillary 83  70 - 99 mg/dL   Comment 1 Documented in Chart     Comment 2 Notify RN     Studies/Results: Dg Chest 2 View  12/26/2013   CLINICAL DATA:  Chest pain. Shortness of breath. Interstitial lung disease.  EXAM: CHEST  2 VIEW  COMPARISON:  06/07/2013.  FINDINGS: Severe enlargement of the cardiopericardial silhouette, which is consistent with both cardiomegaly and a small pericardial effusion. Pulmonary vascular congestion is present. No airspace consolidation. There is no  effusion identified. Monitoring leads project over the chest. Interstitial pulmonary edema is present with Awanda Mink B lines at the periphery of the lung on the frontal view.  IMPRESSION: 1. Chronic severe cardiomegaly, interstitial pulmonary edema and mild CHF. 2. No interval change or acute abnormality.   Electronically Signed   By: Dereck Ligas M.D.   On: 12/26/2013 15:33   Medications: I have reviewed the patient's current medications. Scheduled Meds: . aspirin EC  81 mg Oral Daily  . enoxaparin (LOVENOX) injection  40 mg Subcutaneous Q24H  . fenofibrate  54 mg Oral Daily  . FLUoxetine  20 mg Oral Daily  . furosemide  80 mg Intravenous BID  . pantoprazole  40 mg Oral Daily  . pregabalin  150 mg Oral TID  . sodium chloride  3 mL Intravenous Q12H  . Tadalafil (PAH)  20 mg Oral Q2000   Continuous Infusions: . milrinone 0.25 mcg/kg/min (12/28/13 1041)   PRN Meds:.acetaminophen, acetaminophen, HYDROcodone-acetaminophen Assessment/Plan: Principal Problem:   Acute on chronic systolic right heart  failure Active Problems:   DM type 2 with diabetic peripheral neuropathy   Essential hypertension   Secondary pulmonary hypertension   SCLERODERMA   Hypoglycemia   PAH (pulmonary artery hypertension)   Acute cor pulmonale   RVF (right ventricular failure)   Near syncope  Acute on chronic systolic right heart failure: Weight down -4 lbs today, total -18lbs since admission. BP still running low - 84/37 this morning. Cr bumped slightly from 1.3 to 1.5. LE dopplers negative.  - IV lasix per HF team. Continue to monitor BP closesly - Milrinone drip per HF team - Strict I&O  - Daily weights  - Trend Creatinine - Monitor electrolytes   Secondary pulmonary hypertension: Has history of medication noncompliance - Tadalafil 46m daily and titrate up to 442m  DM type 2 with diabetic peripheral neuropathy with hypoglycemia: hypoglycemia improved, no further episodes. Blood glucose well controlled without medication. -Lyrica for neuropathic pain  CKD Stage 3: Baseline Cr ~1.2-1.5, bumped from 1.3 to 1.5 today. -Continue to monitor  VTE ppx: Lovenox   This is a [employer] information officerote.  The care of the patient was discussed with Dr. HoHeber Carolinand the assessment and plan formulated with their assistance.  Please see their attached note for official documentation of the daily encounter.   LOS: 2 days   DiCiro BackerMed Student 12/28/2013, 11:09 AM

## 2013-12-28 NOTE — Progress Notes (Signed)
  Date: 12/28/2013  Patient name: [name]  Medical record number: [id number]  Date of birth: 19-Nov-1950   This patient has been seen and the plan of care was discussed with the house staff. Please see their note for complete details. I concur with their findings with the following additions/corrections:  Ms. Fetterolf is improving with good diuresis.  HF team is consulted and assisting in her care.   Sid Falcon, MD 12/28/2013, 3:51 PM

## 2013-12-28 NOTE — Progress Notes (Signed)
I called and spoke with Deatra Canter in regards to pts lasix dose if it needed to be held with slight increase in kidney function. She stated to hold off until seen by Dr. Haroldine Laws. I also asked about pts dose of milrinone if it needed to be adjusted for pts weight for today. She instructed me to call Harmon Dun D. After speaking with him, he adjusted the dose for today's weight and we will keep it there until MD addresses to change dose for significant weight gain. Will closely monitor

## 2013-12-28 NOTE — Progress Notes (Signed)
Subjective: Patient reports she is feeling better, denies any SOB, chest pain.  Is pleased with progress so far. Objective: Vital signs in last 24 hours: Filed Vitals:   12/27/13 0835 12/27/13 1618 12/27/13 2100 12/28/13 0441  BP: 102/50 111/68 115/60 84/37  Pulse:  69 82 77  Temp:   98.5 F (36.9 C) 98.3 F (36.8 C)  TempSrc:   Oral Oral  Resp:  _0 Height:      Weight:    162 lb (73.483 kg)  SpO2:  95% 97% 96%   Weight change: -10 lb 5.8 oz (-4.7 kg)  Intake/Output Summary (Last 24 hours) at 12/28/13 0750 Last data filed at 12/28/13 0300  Gross per 24 hour  Intake  188.4 ml  Output   3700 ml  Net -3511.6 ml   General: resting in bed  Cardiac: RRR, 2/6 systolic murmur Pulm:bibasilar rales otherwise CTAB Abd: soft, nontender, nondistended, BS present Ext: warm and well perfused,1-2+ pitting edema bilaterally to knee  Lab Results: Basic Metabolic Panel:  Recent Labs Lab 12/27/13 0320 12/28/13 0315  NA 144 141  K 4.0 3.9  CL 108 104  CO2 22 22  GLUCOSE 79 79  BUN 41* 40*  CREATININE 1.34* 1.51*  CALCIUM 9.4 9.6   Liver Function Tests:  Recent Labs Lab 12/26/13 1210  AST 27  ALT 7  ALKPHOS 164*  BILITOT 1.4*  PROT 9.0*  ALBUMIN 3.5   No results found for this basename: LIPASE, AMYLASE,  in the last 168 hours No results found for this basename: AMMONIA,  in the last 168 hours CBC:  Recent Labs Lab 12/26/13 1210  WBC 4.7  NEUTROABS 2.9  HGB 10.8*  HCT 31.9*  MCV 79.2  PLT 143*   Cardiac Enzymes:  Recent Labs Lab 12/26/13 1210 12/26/13 2223 12/27/13 0320  TROPONINI <0.30 <0.30 <0.30   BNP:  Recent Labs Lab 12/26/13 1210  PROBNP 1194*   D-Dimer: No results found for this basename: DDIMER,  in the last 168 hours CBG:  Recent Labs Lab 12/26/13 1533 12/26/13 2047 12/27/13 0734 12/27/13 1121 12/27/13 1625 12/27/13 2138  GLUCAP 84 92 75 80 94 108*   Hemoglobin A1C:  Recent Labs Lab 12/26/13 1057  HGBA1C 5.3    Fasting Lipid Panel: No results found for this basename: CHOL, HDL, LDLCALC, TRIG, CHOLHDL, LDLDIRECT,  in the last 168 hours Thyroid Function Tests: No results found for this basename: TSH, T4TOTAL, FREET4, T3FREE, THYROIDAB,  in the last 168 hours Coagulation:  Recent Labs Lab 12/26/13 1210  LABPROT 17.9*  INR 1.46   Anemia Panel: No results found for this basename: VITAMINB12, FOLATE, FERRITIN, TIBC, IRON, RETICCTPCT,  in the last 168 hours Urine Drug Screen: Drugs of Abuse     Component Value Date/Time   LABOPIA POSITIVE* 10/02/2010 1128   COCAINSCRNUR NONE DETECTED 10/02/2010 1128   LABBENZ NONE DETECTED 10/02/2010 1128   AMPHETMU NONE DETECTED 10/02/2010 1128   THCU NONE DETECTED 10/02/2010 1128   LABBARB NONE DETECTED 10/02/2010 1128    Alcohol Level: No results found for this basename: ETH,  in the last 168 hours Urinalysis: No results found for this basename: COLORURINE, APPERANCEUR, LABSPEC, PHURINE, GLUCOSEU, HGBUR, BILIRUBINUR, KETONESUR, PROTEINUR, UROBILINOGEN, NITRITE, LEUKOCYTESUR,  in the last 168 hours  Micro Results: No results found for this or any previous visit (from the past 240 hour(s)). Studies/Results: Dg Chest 2 View  12/26/2013   CLINICAL DATA:  Chest pain. Shortness of breath. Interstitial lung disease.  EXAM: CHEST  2 VIEW  COMPARISON:  06/07/2013.  FINDINGS: Severe enlargement of the cardiopericardial silhouette, which is consistent with both cardiomegaly and a small pericardial effusion. Pulmonary vascular congestion is present. No airspace consolidation. There is no effusion identified. Monitoring leads project over the chest. Interstitial pulmonary edema is present with Awanda Mink B lines at the periphery of the lung on the frontal view.  IMPRESSION: 1. Chronic severe cardiomegaly, interstitial pulmonary edema and mild CHF. 2. No interval change or acute abnormality.   Electronically Signed   By: Dereck Ligas M.D.   On: 12/26/2013 15:33    Medications: I have reviewed the patient's current medications. Scheduled Meds: . aspirin EC  81 mg Oral Daily  . enoxaparin (LOVENOX) injection  40 mg Subcutaneous Q24H  . fenofibrate  54 mg Oral Daily  . FLUoxetine  20 mg Oral Daily  . furosemide  80 mg Intravenous BID  . pantoprazole  40 mg Oral Daily  . pregabalin  150 mg Oral TID  . sodium chloride  3 mL Intravenous Q12H  . Tadalafil (PAH)  20 mg Oral Q2000   Continuous Infusions: . milrinone 0.25 mcg/kg/min (12/28/13 0445)   PRN Meds:.acetaminophen, acetaminophen, HYDROcodone-acetaminophen Assessment/Plan:   Acute on chronic systolic right heart failure -Neg -3.6L overnight, weight down to 162. - DVT ruled out with LE dopplers -Currently on milrinone with IV lasix 41m BID, still with lower BP this AM.  Will defer on management to heart failure team.    DM type 2 with diabetic peripheral neuropathy/  Hypoglycemia - Well controlled without medication, no further episodes of hypoglycemia since admission -CBG AC, HS, 3am    Essential hypertension - Again mildly hypotensive overnight.    Secondary pulmonary hypertension - Continue tadalafil 246m Dispo: Disposition is deferred at this time, awaiting improvement of current medical problems.  Anticipated discharge in approximately 2 day(s).   The patient does have a current PCP (ESid FalconMD) and [facility].  The patient does not know have transportation limitations that hinder transportation to clinic appointments.  .Services Needed at time of discharge: Y = Yes, Blank = No PT:   OT:   RN:   Equipment:   Other:     LOS: 2 days   ErLucious GrovesDO 12/28/2013, 7:50 AM

## 2013-12-29 LAB — GLUCOSE, CAPILLARY
Glucose-Capillary: 105 mg/dL — ABNORMAL HIGH (ref 70–99)
Glucose-Capillary: 99 mg/dL (ref 70–99)
Glucose-Capillary: 81 mg/dL (ref 70–99)
Glucose-Capillary: 116 mg/dL — ABNORMAL HIGH (ref 70–99)
Glucose-Capillary: 107 mg/dL — ABNORMAL HIGH (ref 70–99)

## 2013-12-29 LAB — BASIC METABOLIC PANEL
ANION GAP: 15 (ref 5–15)
BUN: 40 mg/dL — AB (ref 6–23)
CALCIUM: 9.6 mg/dL (ref 8.4–10.5)
CHLORIDE: 102 meq/L (ref 96–112)
CO2: 23 meq/L (ref 19–32)
CREATININE: 1.48 mg/dL — AB (ref 0.50–1.10)
GFR calc Af Amer: 43 mL/min — ABNORMAL LOW (ref >90)
GFR calc non Af Amer: 37 mL/min — ABNORMAL LOW (ref >90)
Glucose, Bld: 104 mg/dL — ABNORMAL HIGH (ref 70–99)
Potassium: 4 mEq/L (ref 3.7–5.3)
Sodium: 140 mEq/L (ref 137–147)

## 2013-12-29 MED ORDER — TADALAFIL (PAH) 20 MG PO TABS
40.0000 mg | ORAL_TABLET | Freq: Every day | ORAL | Status: DC
  Filled 2013-12-29: qty 2

## 2013-12-29 NOTE — Progress Notes (Signed)
Advanced Heart Failure Rounding Note   Subjective:    Asked to consult by Internal Medicine for RHF.   Ms. Kipnis is a 63 yo female with a history of DM2, HLD, HTN, pHTN, GERD, gastroparesis, scleroderma, DJD and diastolic HF.   Of note she has been on several PAH targeted therapies and coumadin over last 9 years, but has not been reliably on therapy due to noncompliance and inability to have labs performed. She has had best response to Tyvaso (+ bosentan) with an improvement in 6 minute walk and in PASP from 102 mmHg (7/09) to 42 mmHg (9/10). Since stopping Tyvaso her PASP has risen to 80's by TTE 6/13 and 11/14. Surprisingly, she has never required supplemental O2. Most recent efforts have been to start macitentan in 11/14. She developed nausea and diarrhea and macitentan was stopped early 3/15. Her diarrhea continued (C diff negative)   Was admitted in 3/15 with severe RHF, marked volume overload and renal failure. She was started on milrinone and IV lasix and diuresed 36 pounds. Weight on d/c was 146. At time of discharge we gave her samples of Adcirca and she was supposed to f/u with Dr. Lamonte Sakai to obtain outpatient meds.  Saw Dr. Lamonte Sakai on 12/07/13. Reported she was taking Adcirca and plan was to start Letairis. Weight at that time was 168 pounds   Lasix stopped yesterday and milrinone turned down. Renal function improved. Denies SOB. Weight stable. Renal function better.   ECHO: (06/08/13): EF 50%, RV mod/severely dilated and sys fx mod/severely reduced    Objective:   Weight Range:  Vital Signs:   Temp:  [97.9 F (36.6 C)-98.3 F (36.8 C)] 97.9 F (36.6 C) (10/17 0500) Pulse Rate:  [70-81] 74 (10/17 0500) Resp:  [14-16] 16 (10/17 0500) BP: (98-110)/(47-63) 98/47 mmHg (10/17 0500) SpO2:  [93 %-97 %] 94 % (10/17 0500) Weight:  [74.2 kg (163 lb 9.3 oz)] 74.2 kg (163 lb 9.3 oz) (10/17 0500) Last BM Date: 12/27/13  Weight change: Filed Weights   12/27/13 0753 12/28/13 0441 12/29/13  0500  Weight: 76.4 kg (168 lb 6.9 oz) 73.483 kg (162 lb) 74.2 kg (163 lb 9.3 oz)    Intake/Output:   Intake/Output Summary (Last 24 hours) at 12/29/13 1302 Last data filed at 12/29/13 1100  Gross per 24 hour  Intake    620 ml  Output   1250 ml  Net   -630 ml     Physical Exam: General:  Lying in bed No resp difficulty  HEENT: normal  Neck: supple. JVP 8-9 . Carotids 2+ bilat; no bruits. No lymphadenopathy or thryomegaly appreciated.  Cor: PMI laterally displaced, Regular rate & rhythm. +RV lift, 3/6 TR and Loud P2  Lungs: clear  Abdomen: soft, non tender, non distended. Liver edge down 3 cm; No bruits or masses. Good bowel sounds.  Extremities: no cyanosis, clubbing, rash, trace bilateral edema  Neuro: alert & orientedx3, cranial nerves grossly intact. moves all 4 extremities w/o difficulty. Affect pleasant  Telemetry: SR  Labs: Basic Metabolic Panel:  Recent Labs Lab 12/26/13 1210 12/27/13 0320 12/28/13 0315 12/29/13 0245  NA 145 144 141 140  K 4.3 4.0 3.9 4.0  CL 108 108 104 102  CO2 _0 GLUCOSE 99 79 79 104*  BUN 42* 41* 40* 40*  CREATININE 1.37* 1.34* 1.51* 1.48*  CALCIUM 9.6 9.4 9.6 9.6    Liver Function Tests:  Recent Labs Lab 12/26/13 1210  AST 27  ALT 7  ALKPHOS 164*  BILITOT 1.4*  PROT 9.0*  ALBUMIN 3.5   No results found for this basename: LIPASE, AMYLASE,  in the last 168 hours No results found for this basename: AMMONIA,  in the last 168 hours  CBC:  Recent Labs Lab 12/26/13 1210  WBC 4.7  NEUTROABS 2.9  HGB 10.8*  HCT 31.9*  MCV 79.2  PLT 143*    Cardiac Enzymes:  Recent Labs Lab 12/26/13 1210 12/26/13 2223 12/27/13 0320  TROPONINI <0.30 <0.30 <0.30    BNP: BNP (last 3 results)  Recent Labs  06/09/13 0825 09/12/13 1121 12/26/13 1210  PROBNP 2707.0* 1481* 1194*     Other results:  EKG:   Imaging: No results found.   Medications:     Scheduled Medications: . aspirin EC  81 mg Oral Daily  .  enoxaparin (LOVENOX) injection  40 mg Subcutaneous Q24H  . fenofibrate  54 mg Oral Daily  . FLUoxetine  20 mg Oral Daily  . furosemide  40 mg Oral BID  . pantoprazole  40 mg Oral Daily  . pregabalin  150 mg Oral TID  . sodium chloride  3 mL Intravenous Q12H  . [START ON 12/30/2013] Tadalafil (PAH)  40 mg Oral Q2000    Infusions:    PRN Medications: acetaminophen, acetaminophen, HYDROcodone-acetaminophen   Assessment:   1. A/C RHF  2. PAH with cor pulmonale  3. Scleroderma  4. DM  5. Small pericardial effusion  6. Noncompliance  7. CKD, stage III - baseline Cr 1.6   Plan/Discussion:    Volume status much improved. Back on po lasix.  Renal function improved. Will stop milrinone. Continue Adcirca. If weight stable in am can discharge home. F/u with Dr. Lamonte Sakai.   Daniel Bensimhon,MD 1:02 PM

## 2013-12-29 NOTE — Progress Notes (Signed)
Subjective: [name] has no complaints this morning. Reports decreased leg swelling. Chest tightness resolved. Denies dizziness or lightheadedness.  Objective: Vital signs in last 24 hours: Filed Vitals:   12/28/13 1153 12/28/13 1640 12/28/13 2100 12/29/13 0500  BP: 92/50 103/57 110/63 98/47  Pulse:  70 81 74  Temp:  97.9 F (36.6 C) 98.3 F (36.8 C) 97.9 F (36.6 C)  TempSrc:  Oral Oral Oral  Resp:  _0 Height:      Weight:    74.2 kg (163 lb 9.3 oz)  SpO2:  93% 97% 94%   Weight change: -2.2 kg (-4 lb 13.6 oz)  Intake/Output Summary (Last 24 hours) at 12/29/13 9311 Last data filed at 12/29/13 2162  Gross per 24 hour  Intake    620 ml  Output   1450 ml  Net   -830 ml   Constitutional: No acute distress, well nourished  Head: Normocephalic and atraumatic Eyes: PERRL, EOMI, conjunctivae normal, no scleral icterus.  Neck: Supple, trachea midline  Cardiovascular: regular rate, regular rhythm, no MRG Pulmonary/Chest: normal respiratory effort, CTAB, no wheezes Abdominal: Soft, non-distended, no tenderness to palpation. bowel sounds are normal Neurological: A&O x3, cranial nerve II-XII are grossly intact, no focal motor deficit  Skin: Warm, dry and intact.  Lab Results: Results for orders placed during the hospital encounter of 12/26/13 (from the past 24 hour(s))  GLUCOSE, CAPILLARY     Status: Abnormal   Collection Time    12/28/13 11:12 AM      Result Value Ref Range   Glucose-Capillary 119 (*) 70 - 99 mg/dL   Comment 1 Documented in Chart     Comment 2 Notify RN    GLUCOSE, CAPILLARY     Status: None   Collection Time    12/28/13  4:44 PM      Result Value Ref Range   Glucose-Capillary 88  70 - 99 mg/dL   Comment 1 Documented in Chart     Comment 2 Notify RN    GLUCOSE, CAPILLARY     Status: Abnormal   Collection Time    12/28/13 10:07 PM      Result Value Ref Range   Glucose-Capillary 109 (*) 70 - 99 mg/dL  BASIC METABOLIC PANEL     Status: Abnormal     Collection Time    12/29/13  2:45 AM      Result Value Ref Range   Sodium 140  137 - 147 mEq/L   Potassium 4.0  3.7 - 5.3 mEq/L   Chloride 102  96 - 112 mEq/L   CO2 23  19 - 32 mEq/L   Glucose, Bld 104 (*) 70 - 99 mg/dL   BUN 40 (*) 6 - 23 mg/dL   Creatinine, Ser 1.48 (*) 0.50 - 1.10 mg/dL   Calcium 9.6  8.4 - 10.5 mg/dL   GFR calc non Af Amer 37 (*) >90 mL/min   GFR calc Af Amer 43 (*) >90 mL/min   Anion gap 15  5 - 15  GLUCOSE, CAPILLARY     Status: Abnormal   Collection Time    12/29/13  2:48 AM      Result Value Ref Range   Glucose-Capillary 105 (*) 70 - 99 mg/dL  GLUCOSE, CAPILLARY     Status: None   Collection Time    12/29/13  7:55 AM      Result Value Ref Range   Glucose-Capillary 99  70 - 99 mg/dL   Studies/Results:  No results found. Medications: I have reviewed the patient's current medications. Scheduled Meds: . aspirin EC  81 mg Oral Daily  . enoxaparin (LOVENOX) injection  40 mg Subcutaneous Q24H  . fenofibrate  54 mg Oral Daily  . FLUoxetine  20 mg Oral Daily  . furosemide  40 mg Oral BID  . pantoprazole  40 mg Oral Daily  . pregabalin  150 mg Oral TID  . sodium chloride  3 mL Intravenous Q12H  . [START ON 12/30/2013] Tadalafil (PAH)  40 mg Oral Q2000   Continuous Infusions: . milrinone 0.125 mcg/kg/min (12/29/13 0408)   PRN Meds:.acetaminophen, acetaminophen, HYDROcodone-acetaminophen Assessment/Plan: Principal Problem:   Acute on chronic systolic right heart failure Active Problems:   DM type 2 with diabetic peripheral neuropathy   Essential hypertension   Secondary pulmonary hypertension   SCLERODERMA   Hypoglycemia   PAH (pulmonary artery hypertension)   Acute cor pulmonale   RVF (right ventricular failure)   Near syncope  Acute on chronic systolic right heart failure: Weight down -2 lbs today, total -19lbs since admission. BP still running low but improving. Cr stable at 1.48  - Lasix 49m PO BID. Continue to monitor BP closely -  Milrinone drip 0.125 - wean slowly  -HF team following - Strict I&O  - Daily weights  - Trend Creatinine - Monitor electrolytes   Secondary pulmonary hypertension: Has history of medication noncompliance - Increase tadalafil to 458mdaily   DM type 2 with diabetic peripheral neuropathy with hypoglycemia: hypoglycemia improved, no further episodes. Blood glucose well controlled without medication. -Lyrica for neuropathic pain  CKD Stage 3: Baseline Cr ~1.2-1.5, 1.48 today -Continue to monitor  VTE ppx: Lovenox   This is a [employer] information officerote.  The care of the patient was discussed with Dr. HoHeber Carolinand the assessment and plan formulated with their assistance.  Please see their attached note for official documentation of the daily encounter.   LOS: 3 days   DiCiro BackerMed Student 12/29/2013, 9:28 AM

## 2013-12-29 NOTE — Progress Notes (Signed)
   I have seen the patient and reviewed the daily progress note by Jake Bathe MS 4 and discussed the care of the patient with them.  See below for documentation of my findings, assessment, and plans.  Subjective: Patient feeling well, no complaints, no chest pain, no SOB.  BP normotensive overnight. Objective: Vital signs in last 24 hours: Filed Vitals:   12/28/13 1153 12/28/13 1640 12/28/13 2100 12/29/13 0500  BP: 92/50 103/57 110/63 98/47  Pulse:  70 81 74  Temp:  97.9 F (36.6 C) 98.3 F (36.8 C) 97.9 F (36.6 C)  TempSrc:  Oral Oral Oral  Resp:  _0 Height:      Weight:    163 lb 9.3 oz (74.2 kg)  SpO2:  93% 97% 94%   Weight change: -4 lb 13.6 oz (-2.2 kg)  Intake/Output Summary (Last 24 hours) at 12/29/13 1235 Last data filed at 12/29/13 1100  Gross per 24 hour  Intake    620 ml  Output   1250 ml  Net   -630 ml   General: resting in bed  Cardiac: RRR, 2/6 systolic murmur  Pulm:CTAB  Abd: soft, nontender, nondistended, BS present  Ext: warm and well perfused,1+ pitting edema bilaterally to knee  Lab Results: Reviewed and documented in Electronic Record Micro Results: Reviewed and documented in Electronic Record Studies/Results: Reviewed and documented in Electronic Record Medications: I have reviewed the patient's current medications. Scheduled Meds: . aspirin EC  81 mg Oral Daily  . enoxaparin (LOVENOX) injection  40 mg Subcutaneous Q24H  . fenofibrate  54 mg Oral Daily  . FLUoxetine  20 mg Oral Daily  . furosemide  40 mg Oral BID  . pantoprazole  40 mg Oral Daily  . pregabalin  150 mg Oral TID  . sodium chloride  3 mL Intravenous Q12H  . [START ON 12/30/2013] Tadalafil (PAH)  40 mg Oral Q2000   Continuous Infusions: . milrinone 0.125 mcg/kg/min (12/29/13 0408)   PRN Meds:.acetaminophen, acetaminophen, HYDROcodone-acetaminophen Assessment/Plan: Acute on chronic systolic right heart failure  -Neg -6.3L overnight, weight down to 163.    -Currently on milrinone 0.125 with PO lasix 47m BID. - Weaning milrinone off per Heart failure  DM type 2 with diabetic peripheral neuropathy/ Hypoglycemia  - Well controlled without medication, no further episodes of hypoglycemia since admission  -CBG AC, HS - Lyrica for neuropathic pain  CKD3: - SCr mildly up at 1.48, continue to monitor.  Essential hypertension  - Continue to monitor BP with diuresis  Secondary pulmonary hypertension  - tadalafil 477m Dispo: Disposition is deferred at this time, awaiting improvement of current medical problems.  Anticipated discharge in approximately 1-2 day(s).   The patient does have a current PCP (ESid FalconMD) and [facility].  The patient does not have transportation limitations that hinder transportation to clinic appointments.  .Services Needed at time of discharge: Y = Yes, Blank = No PT:   OT:   RN:   Equipment:   Other:     LOS: 3 days   ErLucious GrovesDO 12/29/2013, 12:35 PM

## 2013-12-30 LAB — BASIC METABOLIC PANEL
Anion gap: 14 (ref 5–15)
BUN: 40 mg/dL — AB (ref 6–23)
CALCIUM: 9.7 mg/dL (ref 8.4–10.5)
CHLORIDE: 101 meq/L (ref 96–112)
CO2: 24 meq/L (ref 19–32)
CREATININE: 1.32 mg/dL — AB (ref 0.50–1.10)
GFR calc Af Amer: 49 mL/min — ABNORMAL LOW (ref >90)
GFR calc non Af Amer: 42 mL/min — ABNORMAL LOW (ref >90)
GLUCOSE: 86 mg/dL (ref 70–99)
Potassium: 4.1 mEq/L (ref 3.7–5.3)
Sodium: 139 mEq/L (ref 137–147)

## 2013-12-30 LAB — GLUCOSE, CAPILLARY
GLUCOSE-CAPILLARY: 90 mg/dL (ref 70–99)
Glucose-Capillary: 80 mg/dL (ref 70–99)

## 2013-12-30 MED ORDER — FUROSEMIDE 40 MG PO TABS: 40.0000 mg | ORAL_TABLET | Freq: Two times a day (BID) | ORAL | Status: DC

## 2013-12-30 NOTE — Discharge Summary (Signed)
Name: [name] MRN: [id number] DOB: Mar 26, 1950 63 y.o. PCP: Sid Falcon, MD  Date of Admission: 12/26/2013  2:26 PM Date of Discharge: 12/30/2013 Attending Physician: Sid Falcon, MD  Discharge Diagnosis: Principal Problem:   Acute on chronic systolic right heart failure Active Problems:   DM type 2 with diabetic peripheral neuropathy   Essential hypertension   Secondary pulmonary hypertension   SCLERODERMA   Hypoglycemia   PAH (pulmonary artery hypertension)   Acute cor pulmonale   RVF (right ventricular failure)   Near syncope  Discharge Medications:   Medication List         ADCIRCA 20 MG Tabs  Generic drug:  Tadalafil (PAH)  Take 40 mg by mouth daily.     ambrisentan 5 MG tablet  Commonly known as:  LETAIRIS  Take 5 mg by mouth daily.     aspirin 81 MG EC tablet  Take 1 tablet (81 mg total) by mouth daily.     [employer] IRON 325 (65 FE) MG tablet  Generic drug:  ferrous sulfate  Take 325 mg by mouth daily with breakfast.     esomeprazole 40 MG capsule  Commonly known as:  NEXIUM  Take by mouth 2 (two) times daily.     fenofibrate 48 MG tablet  Commonly known as:  TRICOR  Take 48 mg by mouth daily.     FLUoxetine 20 MG capsule  Commonly known as:  PROZAC  Take 20 mg by mouth daily.     furosemide 40 MG tablet  Commonly known as:  LASIX  Take 1 tablet (40 mg total) by mouth 2 (two) times daily.     gabapentin 100 MG capsule  Commonly known as:  NEURONTIN  Take 1 capsule (100 mg total) by mouth 2 (two) times daily.     HYDROcodone-acetaminophen 10-325 MG per tablet  Commonly known as:  NORCO  Take 1 tablet by mouth every 6 (six) hours as needed for moderate pain.     pregabalin 150 MG capsule  Commonly known as:  LYRICA  Take 1 capsule (150 mg total) by mouth 3 (three) times daily.        Disposition and follow-up:   Ms.Laurisa S Radick was discharged from [facility] in Stable condition.  At the hospital follow up visit  please address:  1.  Volume status, (Discharge weight 156) compliance with medications (discharged on 40 Lasix BID)  2. Follow up: Dr. Haroldine Laws will to see patient in heart failure clinic. Please make sure she calls and sets up appointment.  She will also need to continue to follow with Dr. Lamonte Sakai  3: Reassess need for potassium, did not require any during diuresis and not prescribed supplement on discharge.  4.  Labs / imaging needed at time of follow-up: BMP  5.  Pending labs/ test needing follow-up: none  Follow-up Appointments: Follow-up Information   Follow up with Day. Schedule an appointment as soon as possible for a visit in 4 days. (for  hospital follow)    Contact information:   [address] [place] [address] [phone]      Follow up with Glori Bickers, MD. Schedule an appointment as soon as possible for a visit in 1 week. (for heart failure follow up)    Specialty:  Cardiology   Contact information:   [address] [place] [address] [phone]       Discharge Instructions: Discharge  Instructions   (HEART FAILURE PATIENTS) Call MD:  Anytime you have any of the following symptoms: 1) 3 pound weight gain in 24 hours or 5 pounds in 1 week 2) shortness of breath, with or without a dry hacking cough 3) swelling in the hands, feet or stomach 4) if you have to sleep on extra pillows at night in order to breathe.    Complete by:  As directed      Call MD for:  difficulty breathing, headache or visual disturbances    Complete by:  As directed      Diet - low sodium heart healthy    Complete by:  As directed      Increase activity slowly    Complete by:  As directed            Consultations: Treatment Team:  Rounding Lbcardiology, MD  Procedures Performed:  Dg Chest 2 View  12/26/2013   CLINICAL DATA:  Chest pain. Shortness of breath. Interstitial lung disease.  EXAM: CHEST  2 VIEW  COMPARISON:  06/07/2013.   FINDINGS: Severe enlargement of the cardiopericardial silhouette, which is consistent with both cardiomegaly and a small pericardial effusion. Pulmonary vascular congestion is present. No airspace consolidation. There is no effusion identified. Monitoring leads project over the chest. Interstitial pulmonary edema is present with Awanda Mink B lines at the periphery of the lung on the frontal view.  IMPRESSION: 1. Chronic severe cardiomegaly, interstitial pulmonary edema and mild CHF. 2. No interval change or acute abnormality.   Electronically Signed   By: Dereck Ligas M.D.   On: 12/26/2013 15:33    2D Echo: none  Admission HPI: [name] is a 63 yo AAF with PMH of scleroderma with severe secondary pulmonary HTN and right heart failure, T2DM, GERD. She presented to the [facility] today accompanied by her twin sister. She reports she has generally been feeling bad for the past few weeks however over the last number of days she reports she has been "picking up a lot of fluid" especially in her legs. In addition she notes that she has had some increased SOB especially with exertion. She does report some chest tightness and notes that she feels like she is being choked when she lays flat. The patient is a poor historian and is difficult to obtain much more history. She reports she is taking Lasix 68m once a day. Her sister confirms this much of the story and is not sure what she takes for medications as she lives close but no with her sister. She does report that a few days ago she went shopping with her sister and her Evonda became light headed and nearly passed out. She says this has happened before but not frequently and she has been begging her sister to come in and be seen about it.   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  Heart Failure team was consulted to assist with diuresis.   She was started on a milrinone drip and diuresed with 831mof IV lasix with close observation of blood pressure.  Gradually lasix was weaned to 4091mO BID and milrinone was weaned off.  She was diuresed a total of 26 lbs from admission and was discharged on Lasix 42m68m BID.  She did not require any potassium supplementation was no potassium was prescribed on discharge.  DM type 2 with diabetic peripheral neuropathy with  Hypoglycemia -Patient was hypoglycemic on admission likely due to poor PO intake.  She was managed with a carb modified diet and required no exogenous insulin during her admission. She had no further episodes.  She was continued on Lyrica for her neuropathic pain.    Essential hypertension - Blood pressure was closely monitored with diuresis and tadalafil.    Secondary pulmonary hypertension secondary to SCLERODERMA -We suspected a degree of non compliance and she was initially restarted on Tadalafil 74m which was increased to her recommended 467m  Her Ambrisentan was held as we do not have it on hospital formulary.  She was instructed to resume this on discharge    Discharge Vitals:   BP 95/58  Pulse 102  Temp(Src) 97.8 F (36.6 C) (Oral)  Resp 18  Ht _0  (1.626 m)  Wt 156 lb 4.9 oz (70.9 kg)  BMI 26.82 kg/m2  SpO2 96%  Discharge Labs:  Results for orders placed during the hospital encounter of 12/26/13 (from the past 24 hour(s))  GLUCOSE, CAPILLARY     Status: Abnormal   Collection Time    12/29/13  5:00 PM      Result Value Ref Range   Glucose-Capillary 116 (*) 70 - 99 mg/dL  GLUCOSE, CAPILLARY     Status: Abnormal   Collection Time    12/29/13  9:37 PM      Result Value Ref Range   Glucose-Capillary 107 (*) 70 - 99 mg/dL  BASIC METABOLIC PANEL     Status: Abnormal   Collection Time    12/30/13  4:56 AM      Result Value Ref Range   Sodium 139  137 - 147 mEq/L   Potassium 4.1  3.7 - 5.3 mEq/L   Chloride 101  96 - 112 mEq/L   CO2 24  19 - 32  mEq/L   Glucose, Bld 86  70 - 99 mg/dL   BUN 40 (*) 6 - 23 mg/dL   Creatinine, Ser 1.32 (*) 0.50 - 1.10 mg/dL   Calcium 9.7  8.4 - 10.5 mg/dL   GFR calc non Af Amer 42 (*) >90 mL/min   GFR calc Af Amer 49 (*) >90 mL/min   Anion gap 14  5 - 15  GLUCOSE, CAPILLARY     Status: None   Collection Time    12/30/13  7:47 AM      Result Value Ref Range   Glucose-Capillary 80  70 - 99 mg/dL  GLUCOSE, CAPILLARY     Status: None   Collection Time    12/30/13 11:44 AM      Result Value Ref Range   Glucose-Capillary 90  70 - 99 mg/dL    Signed: ErLucious GrovesDO 12/30/2013, 12:57 PM    Services Ordered on Discharge: None Equipment Ordered on Discharge: None

## 2013-12-30 NOTE — Progress Notes (Signed)
Subjective: Patient feeling very well this morning. No SOB, no chest pain. Objective: Vital signs in last 24 hours: Filed Vitals:   12/29/13 1512 12/29/13 2054 12/30/13 0600 12/30/13 0620  BP: 108/68 101/62 87/46 95/58  Pulse: 71 61 102   Temp: 97.9 F (36.6 C) 98.5 F (36.9 C) 97.8 F (36.6 C)   TempSrc: Oral Oral Oral   Resp: _0 Height:      Weight: 156 lb 3.2 oz (70.852 kg)  156 lb 4.9 oz (70.9 kg)   SpO2: 98% 95% 96%    Weight change: -7 lb 6.1 oz (-3.348 kg)  Intake/Output Summary (Last 24 hours) at 12/30/13 0832 Last data filed at 12/29/13 1617  Gross per 24 hour  Intake    120 ml  Output   1150 ml  Net  -1030 ml   General: resting in bed, pleasant Cardiac: RRR, 2/6 systolic murmur  Pulm:CTAB  Abd: soft, nontender, nondistended, BS present  Ext: warm and well perfused,trace LE edema  Lab Results: Basic Metabolic Panel:  Recent Labs Lab 12/29/13 0245 12/30/13 0456  NA 140 139  K 4.0 4.1  CL 102 101  CO2 23 24  GLUCOSE 104* 86  BUN 40* 40*  CREATININE 1.48* 1.32*  CALCIUM 9.6 9.7   Liver Function Tests:  Recent Labs Lab 12/26/13 1210  AST 27  ALT 7  ALKPHOS 164*  BILITOT 1.4*  PROT 9.0*  ALBUMIN 3.5   No results found for this basename: LIPASE, AMYLASE,  in the last 168 hours No results found for this basename: AMMONIA,  in the last 168 hours CBC:  Recent Labs Lab 12/26/13 1210  WBC 4.7  NEUTROABS 2.9  HGB 10.8*  HCT 31.9*  MCV 79.2  PLT 143*   Cardiac Enzymes:  Recent Labs Lab 12/26/13 1210 12/26/13 2223 12/27/13 0320  TROPONINI <0.30 <0.30 <0.30   BNP:  Recent Labs Lab 12/26/13 1210  PROBNP 1194*   D-Dimer: No results found for this basename: DDIMER,  in the last 168 hours CBG:  Recent Labs Lab 12/29/13 0248 12/29/13 0755 12/29/13 1205 12/29/13 1700 12/29/13 2137 12/30/13 0747  GLUCAP 105* 99 81 116* 107* 80   Hemoglobin A1C:  Recent Labs Lab 12/26/13 1057  HGBA1C 5.3   Fasting Lipid  Panel: No results found for this basename: CHOL, HDL, LDLCALC, TRIG, CHOLHDL, LDLDIRECT,  in the last 168 hours Thyroid Function Tests: No results found for this basename: TSH, T4TOTAL, FREET4, T3FREE, THYROIDAB,  in the last 168 hours Coagulation:  Recent Labs Lab 12/26/13 1210  LABPROT 17.9*  INR 1.46   Anemia Panel: No results found for this basename: VITAMINB12, FOLATE, FERRITIN, TIBC, IRON, RETICCTPCT,  in the last 168 hours Urine Drug Screen: Drugs of Abuse     Component Value Date/Time   LABOPIA POSITIVE* 10/02/2010 [place] 10/02/2010 1128   LABBENZ NONE DETECTED 10/02/2010 1128   AMPHETMU NONE DETECTED 10/02/2010 1128   THCU NONE DETECTED 10/02/2010 [place] DETECTED 10/02/2010 1128    Micro Results: No results found for this or any previous visit (from the past 240 hour(s)). Studies/Results: No results found. Medications: I have reviewed the patient's current medications. Scheduled Meds: . aspirin EC  81 mg Oral Daily  . enoxaparin (LOVENOX) injection  40 mg Subcutaneous Q24H  . fenofibrate  54 mg Oral Daily  . FLUoxetine  20 mg Oral Daily  . furosemide  40 mg Oral BID  .  pantoprazole  40 mg Oral Daily  . pregabalin  150 mg Oral TID  . sodium chloride  3 mL Intravenous Q12H  . 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 however weight down 36 lbs -PO lasix 49m BID. Off milrinone -Will discharge home today ok Lasix 434mPO BID with close follow up with heart failure clinic.  DM type 2 with diabetic peripheral neuropathy/ Hypoglycemia  - Well controlled without medication, no further episodes of hypoglycemia since admission  -CBG AC, HS   - Lyrica for neuropathic pain   CKD3:  - SCr returned to baseline on PO Lasix 4049mID  Essential hypertension  -  Normotensive  Secondary pulmonary hypertension due to Scleroderma  - tadalafil 42m45mWill resume Ambrisentan 5mg 26mdischarge  Dispo: Discharge home today  The patient does have a current PCP (EmilSid Falcon and [facility].  The patient does not have transportation limitations that hinder transportation to clinic appointments.  .Services Needed at time of discharge: Y = Yes, Blank = No PT:   OT:   RN:   Equipment:   Other:     LOS: 4 days   Britiney Blahnik Lucious Groves10/18/2015, 8:32 AM

## 2013-12-30 NOTE — Discharge Instructions (Signed)
Please resume your medications as instructed on this paper.  Please note that your Lasix (flurosemide)  has been increased to 33m twice a day.  The clinic will call you on Monday or Tuesday to let you know when to come in for a follow up visit.  Please also call and schedule a follow up visit with Dr. BHaroldine Lawswith the Heart Failure Clinic.

## 2014-01-05 NOTE — Discharge Summary (Signed)
I saw [name] on day of discharge and assisted with the discharge planning.

## 2014-01-09 ENCOUNTER — Ambulatory Visit (INDEPENDENT_AMBULATORY_CARE_PROVIDER_SITE_OTHER): Payer: Medicare Other | Admitting: Internal Medicine

## 2014-01-09 ENCOUNTER — Encounter: Payer: Self-pay | Admitting: Internal Medicine

## 2014-01-09 VITALS — BP 95/54 | HR 68 | Temp 98.0°F | Ht 64.0 in | Wt 162.8 lb

## 2014-01-09 DIAGNOSIS — I5023 Acute on chronic systolic (congestive) heart failure: Secondary | ICD-10-CM

## 2014-01-09 DIAGNOSIS — I50813 Acute on chronic right heart failure: Secondary | ICD-10-CM

## 2014-01-09 LAB — BASIC METABOLIC PANEL WITH GFR
BUN: 40 mg/dL — ABNORMAL HIGH (ref 6–23)
CHLORIDE: 103 meq/L (ref 96–112)
CO2: 24 mEq/L (ref 19–32)
Calcium: 9.3 mg/dL (ref 8.4–10.5)
Creat: 1.44 mg/dL — ABNORMAL HIGH (ref 0.50–1.10)
GFR, Est African American: 45 mL/min — ABNORMAL LOW
GFR, Est Non African American: 39 mL/min — ABNORMAL LOW
Glucose, Bld: 58 mg/dL — ABNORMAL LOW (ref 70–99)
Potassium: 4.6 mEq/L (ref 3.5–5.3)
SODIUM: 139 meq/L (ref 135–145)

## 2014-01-09 LAB — GLUCOSE, CAPILLARY: GLUCOSE-CAPILLARY: 93 mg/dL (ref 70–99)

## 2014-01-09 NOTE — Patient Instructions (Signed)
General Instructions: Please continue with your medication  Please be sure to see heart failure clinic  Please come back on Friday  Please bring your medicines with you each time you come to clinic.  Medicines may include prescription medications, over-the-counter medications, herbal remedies, eye drops, vitamins, or other pills.   Progress Toward Treatment Goals:  Treatment Goal 09/12/2013  Hemoglobin A1C at goal  Blood pressure at goal    Self Care Goals & Plans:  Self Care Goal 01/09/2014  Manage my medications take my medicines as prescribed; bring my medications to every visit; refill my medications on time  Monitor my health -  Eat healthy foods drink diet soda or water instead of juice or soda; eat more vegetables; eat foods that are low in salt; eat baked foods instead of fried foods; eat fruit for snacks and desserts  Be physically active -    Home Blood Glucose Monitoring 08/22/2013  Check my blood sugar once a day  When to check my blood sugar -     Care Management & Community Referrals:  Referral 04/16/2013  Referrals made for care management support none needed  Referrals made to community resources -

## 2014-01-09 NOTE — Progress Notes (Signed)
Patient ID: [name], female   DOB: 23-Jun-1950, 63 y.o.   MRN: [id number]  Case discussed with Dr. Alice Rieger at the time of the visit.  We reviewed the resident's history and exam and pertinent patient test results.  I agree with the assessment, diagnosis, and plan of care documented in the resident's note.

## 2014-01-09 NOTE — Progress Notes (Signed)
Patient ID: [name], female   DOB: 04/05/1950, 63 y.o.   MRN: [id number]   Subjective:   HPI: [name] is a 63 y.o. woman with PMH of ILD, scleroderma, Cor pulmonale, DM2 (not on medications), GERD, HTN.    Reason(s) for this visit: Hospital followup visit for acute on chronic systolic right heart failure: She was discharged from the hospital on 12/30/2013. She was treated in consultation with heart failure team for volume overload. She required IV Milronone in addition to IV Lasix. Patient lost over 26 pounds before she was discharged. She reports today that her shortness of breath on exertion has remained somehow improved since her discharge. However, she is still significantly short of breath on moving in the house. She has significant difficulties making her bed and her sister usually helps her with that. She is otherwise able to perform her ADLs. She mentioned that she is able to clean the bathroom without difficulties, even though she needs frequent breaks. She's not been able to walk outside due to dyspnea. Her lower extremity swelling has remained stable/improved. She has been compliant with her dose of Lasix of 40 mg twice a day. She denies chest pain or palpitations. However, she reports she feels somehow dizzy when she exerts herself, but does not feel dizzy when she stands up from a seated position. She has no history of falls or passing out. She states that her Lasix dose that does not make her go to the bathroom as frequently as she would expect. She denies recent dietary indiscretions.   Her discharge weight on 12/30/2013 was 156lb. She weighs 162 pounds today.  Her twin sister accompanies her to her visit.  ROS: Constitutional: Denies fever, chills, diaphoresis, appetite change and fatigue.  Respiratory: Denies cough, chest tightness, and wheezing. Denies chest pain. CVS: No chest pain, palpitations and leg swelling.  GI: No abdominal pain, nausea, vomiting, bloody  stools GU: No dysuria, frequency, hematuria, or flank pain.  MSK: No myalgias, back pain, joint swelling, arthralgias  Psych: No depression symptoms. No SI or SA.    Objective:  Physical Exam: Filed Vitals:   01/09/14 1015 01/09/14 1028  BP: 88/48 95/54  Pulse: 72 68  Temp: 98 F (36.7 C)   TempSrc: Oral   Height: _0  (1.626 m)   Weight: 162 lb 12.8 oz (73.846 kg)   SpO2: 97%    Initial blood pressure in a patient just arrived to the clinic was 88/48.  Orthostatic Vital Signs:  Blood Pressure Heart Rate  Laying  90/53 mmHg  70 bpm  Sitting   98/50 mmHg  70 bpm  Standing  99/51 mmHg  69 bpm   General: Chronically ill-appearing. No acute distress.  HEENT: Normal oral mucosa. MMM.  Lungs: CTA bilaterally. No rales in the bases.  Heart: RRR; no extra sounds or murmurs JVD not appreciable at 45 of inclination. However, at 30, I can see jugular distention , which is almost to the level of mandible. Abdomen: Non-distended, normal bowel sounds, soft, nontender; no hepatosplenomegaly  Extremities: No pedal edema. No joint swelling or tenderness. Neurologic: Normal EOM,  Alert and oriented x3. No obvious neurologic/cranial nerve deficits.  Assessment & Plan:  Discussed case with my attending in the clinic, Dr. Ellwood Dense See problem based charting.

## 2014-01-09 NOTE — Assessment & Plan Note (Addendum)
Symptoms have remained stable besides some dizziness, which comes with walking. Her physical exam does not reveal much evidence in terms of fluid overload. However, had much inclination to 30 she has distention of the jugular vein. She is breathing comfortably and her chest exam does not reveal rales. Even though her blood pressure is somehow low, she is not orthostatic. She has gained 6 pounds over the past 10 days. Plan - Continue with Lasix at 40 mg twice a day - Will monitor closely with renal function. Check BMP today - Patient will need a clinic visit in 2 days to make sure that her blood pressure remained stable - She has heart failure clinic appointment on 01/17/2014. Patient and her sister made aware of this apt - Discussed safety precautions with dizziness at home provided.

## 2014-01-11 ENCOUNTER — Ambulatory Visit (INDEPENDENT_AMBULATORY_CARE_PROVIDER_SITE_OTHER): Payer: Medicare Other | Admitting: Internal Medicine

## 2014-01-11 ENCOUNTER — Encounter: Payer: Self-pay | Admitting: Internal Medicine

## 2014-01-11 VITALS — BP 99/53 | HR 68 | Temp 97.7°F | Ht 64.0 in | Wt 164.0 lb

## 2014-01-11 DIAGNOSIS — I50813 Acute on chronic right heart failure: Secondary | ICD-10-CM

## 2014-01-11 DIAGNOSIS — I5023 Acute on chronic systolic (congestive) heart failure: Secondary | ICD-10-CM

## 2014-01-11 LAB — BASIC METABOLIC PANEL WITH GFR
BUN: 46 mg/dL — ABNORMAL HIGH (ref 6–23)
CALCIUM: 9.5 mg/dL (ref 8.4–10.5)
CHLORIDE: 104 meq/L (ref 96–112)
CO2: 23 mEq/L (ref 19–32)
Creat: 1.39 mg/dL — ABNORMAL HIGH (ref 0.50–1.10)
GFR, EST NON AFRICAN AMERICAN: 41 mL/min — AB
GFR, Est African American: 47 mL/min — ABNORMAL LOW
Glucose, Bld: 91 mg/dL (ref 70–99)
Potassium: 4.4 mEq/L (ref 3.5–5.3)
SODIUM: 141 meq/L (ref 135–145)

## 2014-01-11 LAB — GLUCOSE, CAPILLARY: GLUCOSE-CAPILLARY: 101 mg/dL — AB (ref 70–99)

## 2014-01-11 MED ORDER — FUROSEMIDE 40 MG PO TABS: 40.0000 mg | ORAL_TABLET | ORAL | Status: DC

## 2014-01-11 NOTE — Assessment & Plan Note (Addendum)
Diuretic therapy has been somehow complicated by low blood pressure and poor renal function. Symptoms have remained stable. Her lung exam is unremarkable without problems. Noted her weight is up by 2 pounds within the last 2 days (from 162 to 164lb) Plan - I will increase Lasix from 40 mg twice a day to 60 mg in the morning and 40 mg in the evening. I am not sure this will be adequate to diurize her but I will avoid higher diuretic dose due to soft BP. Discussed with patient and her sister about signs and symptoms of hypotension and advised her to call clinic if she is concerned. She can also go to ED if clinic is closed. They verbalized understanding. - She has a followup appointment with the heart failure clinic on 01/17/2014. - discussed salt and fluid restriction and weight monitoring - Will check a BMP today. - f/u in 2 weeks here

## 2014-01-11 NOTE — Progress Notes (Signed)
Patient ID: [name], female   DOB: 05-21-1950, 63 y.o.   MRN: [id number] Case discussed with Dr. Alice Rieger soon after the resident saw the patient.  We reviewed the resident's history and exam and pertinent patient test results.  I agree with the assessment, diagnosis, and plan of care documented in the resident's note.

## 2014-01-11 NOTE — Progress Notes (Signed)
Patient ID: [name], female   DOB: [birth year], 63 y.o.   MRN: [id number]   Subjective:   HPI: [name] is a 63 y.o. woman with PMH of ILD, scleroderma, Cor pulmonale, DM2 (not on medications), GERD, HTN.    Reason(s) for this visit: Acute on chronic systolic right heart failure: I saw the patient 2 days ago. I noted that she had gained 6 pounds since her discharge from the hospital on 12/30/2013. She is currently taking furosemide 40 mg twice a day. She is compliant. Her weight today is slightly higher by 2 pounds from 162 to 164 pounds. However, symptoms have remained stable. She has an appointment with the heart failure Clinic on 01/17/2014. She denies chest pain or palpitations. She reports that her chronic dizziness with ambulation, is about the same since her last visit 2 days ago.  Blood pressure is soft, but stable at 99/53 mmHg. Orthostatics not performed today.  Her twin sister accompanies her to her visit.  ROS: Constitutional: Denies fever, chills, diaphoresis, appetite change and fatigue.  Respiratory: Denies cough, chest tightness, and wheezing. Denies chest pain. CVS: No chest pain, palpitations and leg swelling.  GI: No abdominal pain, nausea, vomiting, bloody stools GU: No dysuria, frequency, hematuria, or flank pain.  MSK: No myalgias, back pain, joint swelling, arthralgias  Psych: No depression symptoms. No SI or SA.    Objective:  Physical Exam: Filed Vitals:   01/11/14 0921  BP: 99/53  Pulse: 68  Temp: 97.7 F (36.5 C)  TempSrc: Oral  Height: _0  (1.626 m)  Weight: 164 lb (74.39 kg)  SpO2: 99%  General: Chronically ill-appearing. No acute distress.  HEENT: Normal oral mucosa. MMM.  Lungs: CTA bilaterally. No rales in the bases.  Heart: RRR; no extra sounds or murmurs JVD not appreciable at 45 of inclination.  Abdomen: Non-distended, normal bowel sounds, soft, nontender; no hepatosplenomegaly  Extremities: No pedal edema. No joint swelling  or tenderness. Neurologic: Normal EOM,  Alert and oriented x3. No obvious neurologic/cranial nerve deficits.  Assessment & Plan:  Discussed case with my attending in the clinic, Dr. Ellwood Dense See problem based charting.

## 2014-01-11 NOTE — Patient Instructions (Signed)
General Instructions: Please increase lasix 60 mg in the morning and 40 mg in the evening We will check blood today  Please be sure to go for your appointment on Thursday   Please bring your medicines with you each time you come to clinic.  Medicines may include prescription medications, over-the-counter medications, herbal remedies, eye drops, vitamins, or other pills.   Progress Toward Treatment Goals:  Treatment Goal 09/12/2013  Hemoglobin A1C at goal  Blood pressure at goal    Self Care Goals & Plans:  Self Care Goal 01/11/2014  Manage my medications take my medicines as prescribed; bring my medications to every visit; refill my medications on time  Monitor my health -  Eat healthy foods drink diet soda or water instead of juice or soda; eat more vegetables; eat foods that are low in salt; eat baked foods instead of fried foods; eat fruit for snacks and desserts  Be physically active -    Home Blood Glucose Monitoring 08/22/2013  Check my blood sugar once a day  When to check my blood sugar -     Care Management & Community Referrals:  Referral 04/16/2013  Referrals made for care management support none needed  Referrals made to community resources -

## 2014-01-14 ENCOUNTER — Encounter: Payer: Self-pay | Admitting: Internal Medicine

## 2014-01-14 ENCOUNTER — Other Ambulatory Visit: Payer: Self-pay | Admitting: Internal Medicine

## 2014-01-16 ENCOUNTER — Inpatient Hospital Stay (HOSPITAL_COMMUNITY): Payer: Medicare Other

## 2014-01-17 ENCOUNTER — Ambulatory Visit (HOSPITAL_COMMUNITY)
Admission: RE | Admit: 2014-01-17 | Discharge: 2014-01-17 | Disposition: A | Discharge status: Home / Self Care | Payer: Medicare Other | Source: Ambulatory Visit | Attending: Internal Medicine | Admitting: Internal Medicine

## 2014-01-17 ENCOUNTER — Encounter (HOSPITAL_COMMUNITY): Payer: Self-pay

## 2014-01-17 VITALS — BP 90/51 | HR 70 | Resp 18 | Wt 160.5 lb

## 2014-01-17 DIAGNOSIS — I509 Heart failure, unspecified: Secondary | ICD-10-CM | POA: Insufficient documentation

## 2014-01-17 DIAGNOSIS — N183 Chronic kidney disease, stage 3 unspecified: Secondary | ICD-10-CM

## 2014-01-17 DIAGNOSIS — I5023 Acute on chronic systolic (congestive) heart failure: Secondary | ICD-10-CM

## 2014-01-17 DIAGNOSIS — I50810 Right heart failure, unspecified: Secondary | ICD-10-CM

## 2014-01-17 DIAGNOSIS — I272 Other secondary pulmonary hypertension: Secondary | ICD-10-CM

## 2014-01-17 DIAGNOSIS — IMO0002 Reserved for concepts with insufficient information to code with codable children: Secondary | ICD-10-CM

## 2014-01-17 DIAGNOSIS — I50813 Acute on chronic right heart failure: Secondary | ICD-10-CM

## 2014-01-17 MED ORDER — FUROSEMIDE 40 MG PO TABS: 40.0000 mg | ORAL_TABLET | Freq: Two times a day (BID) | ORAL | Status: DC

## 2014-01-17 NOTE — Patient Instructions (Signed)
Doing well.  Call if your weight starts going up. [phone]  Take all your medications.  Follow up 6 weeks.  Do the following things EVERYDAY: 1) Weigh yourself in the morning before breakfast. Write it down and keep it in a log. 2) Take your medicines as prescribed 3) Eat low salt foods-Limit salt (sodium) to 2000 mg per day.  4) Stay as active as you can everyday 5) Limit all fluids for the day to less than 2 liters

## 2014-01-17 NOTE — Progress Notes (Signed)
Patient ID: [name], female   DOB: Dec 29, 1950, 63 y.o.   MRN: [id number] PCP: Internal Medicine Pulmonlogist: Dr. Lamonte Sakai  HPI: [name] is a 63 yo female with a history of DM2, HLD, HTN, pHTN, GERD, gastroparesis, scleroderma, DJD and diastolic HF.   Of note she has been on several PAH targeted therapies and coumadin over last 9 years, but has not been reliably on therapy due to noncompliance and inability to have labs performed. She has had best response to Tyvaso (+ bosentan) with an improvement in 6 minute walk and in PASP from 102 mmHg (7/09) to 42 mmHg (9/10). Since stopping Tyvaso her PASP has risen to 80's by TTE 6/13 and 11/14. Surprisingly, she has never required supplemental O2. Most recent efforts have been to start macitentan in 11/14. She developed nausea and diarrhea and macitentan was stopped early 3/15. Her diarrhea continued (C diff negative)   Admitted 10/14-10/18/15 for A/C RHF, volume overload and syncope. Diuresed with IV lasix and milrinone a total of 26 lbs. Discharge weight was 156 lbs and started on lasix 40 mg BID.   [facility] Follow up for Heart Failure: Doing well. Denies SOB, CP or edema. +chronic orthopnea. Weight at home 158-159 lbs. Reports taking medications as prescribed. DOE with minimal exertion 50 ft. Trying to follow a salt diet and drinking more than 2L a day.   ECHO: (06/08/13): EF 50%, RV mod/severely dilated and sys fx mod/severely reduced   ROS: All systems negative except as listed in HPI, PMH and Problem List.  SH:  History   Social History  . Marital Status: Divorced    Spouse Name: N/A    Number of Children: 2  . Years of Education: 11   Occupational History  . Umemployed   . Disability     Social History Main Topics  . Smoking status: Never Smoker   . Smokeless tobacco: Never Used  . Alcohol Use: No  . Drug Use: No  . Sexual Activity: Not on file   Other Topics Concern  . Not on file   Social History Narrative    FAMILY  HISTORY:  Significant for coronary artery disease and diabetes   Patient lives at home with granddaughter.    Patient has 2 children.    Patient has 11 years of education.    Patient is on disability.    Patient is right handed.    Patient is separated.      FH:  Family History  Problem Relation Age of Onset  . Heart disease Mother   . Diabetes Mother   . Diabetes Sister     Past Medical History  Diagnosis Date  . Secondary pulmonary hypertension     right heart cath 04/20/04  . Cough   . Allergic rhinitis, cause unspecified   . Esophageal reflux   . Systemic sclerosis   . Unspecified essential hypertension   . Gastritis   . Sickle cell trait     "trace"  . Obesity   . Visual changes   . Scleroderma   . Diastolic dysfunction   . Trichomonas   . Vaginal bleeding   . Neuropathy   . CHF (congestive heart failure)   . Type II diabetes mellitus     DIET CONTROL   . Bursitis   . History of blood transfusion     "think it was related to when I had partial hysteretomy; might have been for one of my knee ORs"  . Degeneration  of lumbar or lumbosacral intervertebral disc   . Arthritis     "all over my body" (08/04/2013)  . Chronic kidney disease     "been in hospital for it; don't know what kind" (08/04/2013)    Current Outpatient Prescriptions  Medication Sig Dispense Refill  . ambrisentan (LETAIRIS) 5 MG tablet Take 5 mg by mouth daily.    Marland Kitchen aspirin EC 81 MG EC tablet Take 1 tablet (81 mg total) by mouth daily. 30 tablet 3  . esomeprazole (NEXIUM) 40 MG capsule Take by mouth 2 (two) times daily.      . fenofibrate (TRICOR) 48 MG tablet Take 48 mg by mouth daily.    . ferrous sulfate ([employer] IRON) 325 (65 FE) MG tablet Take 325 mg by mouth daily with breakfast.    . FLUoxetine (PROZAC) 20 MG capsule take 1 capsule by mouth every morning 30 capsule 2  . furosemide (LASIX) 40 MG tablet Take 1 tablet (40 mg total) by mouth as directed. Please 60 mg in the morning and 40 mg in the  evening (Patient taking differently: Take 40 mg by mouth as directed. Please 60 mg in the morning and 20 mg in the evening) 60 tablet 0  . gabapentin (NEURONTIN) 100 MG capsule Take 1 capsule (100 mg total) by mouth 2 (two) times daily. 60 capsule 11  . HYDROcodone-acetaminophen (NORCO) 10-325 MG per tablet Take 1 tablet by mouth every 6 (six) hours as needed for moderate pain.     . pregabalin (LYRICA) 150 MG capsule Take 1 capsule (150 mg total) by mouth 3 (three) times daily. 90 capsule 5  . Tadalafil, PAH, (ADCIRCA) 20 MG TABS Take 40 mg by mouth daily.     No current facility-administered medications for this encounter.    Filed Vitals:   01/17/14 1200  BP: 90/51  Pulse: 70  Resp: 18  Weight: 160 lb 8 oz (72.802 kg)  SpO2: 97%    PHYSICAL EXAM:  General:  Well appearing. No resp difficulty HEENT: normal Neck: supple. JVP 6-7. Carotids 2+ bilaterally; no bruits. No lymphadenopathy or thryomegaly appreciated. Cor: PMI laterally displaced, Regular rate & rhythm. +RV lift, 3/6 TR and Loud P2  Lungs: clear Abdomen: soft, nontender, nondistended. No hepatosplenomegaly. No bruits or masses. Good bowel sounds. Extremities: no cyanosis, clubbing, rash, trace bilateral edema Neuro: alert & orientedx3, cranial nerves grossly intact. Moves all 4 extremities w/o difficulty. Affect pleasant.  ASSESSMENT & PLAN:  1) Chronic Right Heart Failure: EF 50%, RV mod/severely dilated and sys fx mod/severely reduced (05/2013) - Just discharged from the hospital for A/C RHF and diuresed a total of 26 lbs on IV lasix and milrinone. Discharge weight was 156 lbs. - Continues with NYHA III symptoms. Volume status stable and will change lasix to 40 mg BID.  - Blood pressure stable.  - Lengthy discussion with patient and sister about the need for the patient to take medications as prescribed, follow up with pulmonary and our clinic, to call if weight is up more than 3 lbs in a day or 5 lbs in a week, to  follow low salt diet and restrict fluids to less than 2L a day.  2) PAH with cor pulmonale - followed by Dr. Lamonte Sakai and told to call and set up an appointment. Continue Adcirca 40 mg daily 3) CKD stage III - baseline creatinine 1.6. Checked by PCP 10/30 and renal function stable. Will need to continue to follow closely.     F/U 6  weeks Junie Bame B NP-C 8:04 AM

## 2014-01-18 DIAGNOSIS — N183 Chronic kidney disease, stage 3 unspecified: Secondary | ICD-10-CM | POA: Insufficient documentation

## 2014-01-25 ENCOUNTER — Ambulatory Visit (INDEPENDENT_AMBULATORY_CARE_PROVIDER_SITE_OTHER): Payer: Medicare Other | Admitting: Internal Medicine

## 2014-01-25 ENCOUNTER — Encounter: Payer: Self-pay | Admitting: Internal Medicine

## 2014-01-25 VITALS — BP 95/49 | HR 72 | Temp 98.0°F | Ht 64.0 in | Wt 169.3 lb

## 2014-01-25 DIAGNOSIS — I5023 Acute on chronic systolic (congestive) heart failure: Secondary | ICD-10-CM

## 2014-01-25 DIAGNOSIS — E119 Type 2 diabetes mellitus without complications: Secondary | ICD-10-CM

## 2014-01-25 DIAGNOSIS — I50813 Acute on chronic right heart failure: Secondary | ICD-10-CM

## 2014-01-25 LAB — GLUCOSE, CAPILLARY: Glucose-Capillary: 116 mg/dL — ABNORMAL HIGH (ref 70–99)

## 2014-01-25 NOTE — Progress Notes (Signed)
Internal Medicine Clinic Attending  I saw and evaluated the patient.  I personally confirmed the key portions of the history and exam documented by Dr. Ethelene Hal and I reviewed pertinent patient test results.  The assessment, diagnosis, and plan were formulated together and I agree with the documentation in the resident's note.

## 2014-01-25 NOTE — Assessment & Plan Note (Signed)
See HPI. She was recently seen by HF clinic who changed her lasix to 40 mg bid. There is concern about medication management as she did not bring them with her today and she did not know her medications from memory. She had a small paper with medications scribbled on it which did have lasix 40 mg bid correct. She has been offered [facility] services int he past (08/2013) and refused. She similarly refused today as she thinks she has no issues with medication adherence. She has no LE swelling and lungs CTAB with no SOB so we will keep her on lasix 40 mg bid and reassess in one month. She says she has some belly swelling but abdomen was nondistended on exam. Her weight has increased 5 lbs in 2 weeks and we gave her precautions to return to clinic if she gains >3 lbs in a day or 5 lb in a week. We are also concerned about increasing her diuresis due to hypotension. She says she gets up slowly so she is not lightheaded. She was not orthostatic on exam so this may be autonomic dysfunction.  -continue lasix 40 mg bid -daily weights and precautions as above -repeat BMP at next visit

## 2014-01-25 NOTE — Progress Notes (Signed)
Subjective:    Patient ID: [name], female    DOB: May 10, 1950, 63 y.o.   MRN: [id number]  HPI  [name] is a 63 year old woman with dCHF, HL, HTN, scleroderma who comes for  follow-up. She was originally hospitalzied from 10/14-18 for acute on chronic right sided heart failure. Her diuresis was complicated by hypotension and poor renal function. At her heart failure clinic visit 11/5, her lasix was changed to 40 mg bid. On 10/30 her BMET was normal except for creatinine 1.39 which is stable.  Since her last appointment, she still feels like she has chronic trouble breathing but her chest "does not feel full right now." She has no trouble breathing but thinks her belly may be a little swollen. She sometimes feels a little light headed so she tries to get up slowly. She checks her weight at home which was 165 lb at home. She did not bring her medications today and cannot remember off her head what she takes but has a written list. On this list, she has furosemide 40 mg twice a day and says she takes "one in the morning and one in the afternoon." She does not think she has been urinated much.  Review of Systems  Constitutional: Positive for unexpected weight change. Negative for fever, chills and diaphoresis.  Respiratory: Negative for shortness of breath.   Cardiovascular: Negative for chest pain and leg swelling.  Gastrointestinal: Positive for abdominal distention. Negative for nausea, vomiting, abdominal pain, diarrhea and constipation.  Neurological: Positive for light-headedness. Negative for syncope, weakness, numbness and headaches.       Objective:   Physical Exam  Constitutional: She is oriented to person, place, and time. She appears well-developed and well-nourished. No distress.  HENT:  Head: Normocephalic and atraumatic.  Mouth/Throat: Oropharynx is clear and moist.  Eyes: EOM are normal. Pupils are equal, round, and reactive to light.  Cardiovascular: Normal rate,  regular rhythm, normal heart sounds and intact distal pulses.  Exam reveals no gallop and no friction rub.   No murmur heard. Pulmonary/Chest: Effort normal and breath sounds normal. No respiratory distress. She has no wheezes.  Abdominal: Soft. Bowel sounds are normal. She exhibits no distension. There is no tenderness.  Musculoskeletal: She exhibits no edema.  Neurological: She is alert and oriented to person, place, and time.  Skin: She is not diaphoretic.  Vitals reviewed.         Assessment & Plan:

## 2014-01-25 NOTE — Patient Instructions (Signed)
It was a pleasure to see you today. Please continue to weight yourself everyday. If you gain 3 or more pounds in a day or 5 or more pounds in a week please call the clinic or come in. Please return to clinic or seek medical attention if you have any new or worsening trouble breathing, chest pain, or other worrisome medical condition. We look forward to seeing you again in 1 month.  Lottie Mussel, MD  General Instructions:   Please bring your medicines with you each time you come to clinic.  Medicines may include prescription medications, over-the-counter medications, herbal remedies, eye drops, vitamins, or other pills.   Progress Toward Treatment Goals:  Treatment Goal 09/12/2013  Hemoglobin A1C at goal  Blood pressure at goal    Self Care Goals & Plans:  Self Care Goal 01/25/2014  Manage my medications take my medicines as prescribed; bring my medications to every visit; refill my medications on time  Monitor my health -  Eat healthy foods drink diet soda or water instead of juice or soda; eat more vegetables; eat foods that are low in salt; eat baked foods instead of fried foods; eat fruit for snacks and desserts  Be physically active -    Home Blood Glucose Monitoring 08/22/2013  Check my blood sugar once a day  When to check my blood sugar -     Care Management & Community Referrals:  Referral 04/16/2013  Referrals made for care management support none needed  Referrals made to community resources -

## 2014-01-30 ENCOUNTER — Encounter: Payer: Self-pay | Admitting: Neurology

## 2014-02-05 ENCOUNTER — Encounter: Payer: Self-pay | Admitting: Neurology

## 2014-02-27 ENCOUNTER — Ambulatory Visit (HOSPITAL_COMMUNITY)
Admission: RE | Admit: 2014-02-27 | Discharge: 2014-02-27 | Disposition: A | Discharge status: Home / Self Care | Payer: Medicare Other | Source: Ambulatory Visit | Attending: Internal Medicine | Admitting: Internal Medicine

## 2014-02-27 ENCOUNTER — Ambulatory Visit (INDEPENDENT_AMBULATORY_CARE_PROVIDER_SITE_OTHER): Payer: Medicare Other | Admitting: Internal Medicine

## 2014-02-27 ENCOUNTER — Encounter: Payer: Self-pay | Admitting: Internal Medicine

## 2014-02-27 VITALS — BP 97/51 | HR 76 | Temp 97.9°F | Ht 64.0 in | Wt 164.4 lb

## 2014-02-27 DIAGNOSIS — I519 Heart disease, unspecified: Secondary | ICD-10-CM

## 2014-02-27 DIAGNOSIS — I10 Essential (primary) hypertension: Secondary | ICD-10-CM

## 2014-02-27 DIAGNOSIS — N183 Chronic kidney disease, stage 3 unspecified: Secondary | ICD-10-CM

## 2014-02-27 DIAGNOSIS — M25562 Pain in left knee: Secondary | ICD-10-CM | POA: Diagnosis not present

## 2014-02-27 DIAGNOSIS — R110 Nausea: Secondary | ICD-10-CM

## 2014-02-27 DIAGNOSIS — I272 Other secondary pulmonary hypertension: Secondary | ICD-10-CM

## 2014-02-27 DIAGNOSIS — Z96652 Presence of left artificial knee joint: Secondary | ICD-10-CM | POA: Insufficient documentation

## 2014-02-27 DIAGNOSIS — R55 Syncope and collapse: Secondary | ICD-10-CM

## 2014-02-27 DIAGNOSIS — A09 Infectious gastroenteritis and colitis, unspecified: Secondary | ICD-10-CM

## 2014-02-27 DIAGNOSIS — M25462 Effusion, left knee: Secondary | ICD-10-CM | POA: Diagnosis present

## 2014-02-27 DIAGNOSIS — R197 Diarrhea, unspecified: Secondary | ICD-10-CM

## 2014-02-27 DIAGNOSIS — W19XXXA Unspecified fall, initial encounter: Secondary | ICD-10-CM

## 2014-02-27 DIAGNOSIS — IMO0002 Reserved for concepts with insufficient information to code with codable children: Secondary | ICD-10-CM

## 2014-02-27 DIAGNOSIS — I5189 Other ill-defined heart diseases: Secondary | ICD-10-CM

## 2014-02-27 LAB — GLUCOSE, CAPILLARY: GLUCOSE-CAPILLARY: 107 mg/dL — AB (ref 70–99)

## 2014-02-27 LAB — CBC WITH DIFFERENTIAL/PLATELET
Basophils Absolute: 0 10*3/uL (ref 0.0–0.1)
Basophils Relative: 0 % (ref 0–1)
Eosinophils Absolute: 0.1 10*3/uL (ref 0.0–0.7)
Eosinophils Relative: 1 % (ref 0–5)
HCT: 28.4 % — ABNORMAL LOW (ref 36.0–46.0)
Hemoglobin: 9.5 g/dL — ABNORMAL LOW (ref 12.0–15.0)
LYMPHS ABS: 1.5 10*3/uL (ref 0.7–4.0)
Lymphocytes Relative: 26 % (ref 12–46)
MCH: 27.5 pg (ref 26.0–34.0)
MCHC: 33.5 g/dL (ref 30.0–36.0)
MCV: 82.3 fL (ref 78.0–100.0)
Monocytes Absolute: 0.6 10*3/uL (ref 0.1–1.0)
Monocytes Relative: 10 % (ref 3–12)
NEUTROS ABS: 3.6 10*3/uL (ref 1.7–7.7)
NEUTROS PCT: 63 % (ref 43–77)
PLATELETS: 126 10*3/uL — AB (ref 150–400)
RBC: 3.45 MIL/uL — ABNORMAL LOW (ref 3.87–5.11)
RDW: 15.4 % (ref 11.5–15.5)
WBC: 5.7 10*3/uL (ref 4.0–10.5)

## 2014-02-27 LAB — COMPLETE METABOLIC PANEL WITH GFR
ALT: 8 U/L (ref 0–35)
AST: 24 U/L (ref 0–37)
Albumin: 3.6 g/dL (ref 3.5–5.2)
Alkaline Phosphatase: 141 U/L — ABNORMAL HIGH (ref 39–117)
BUN: 52 mg/dL — AB (ref 6–23)
CALCIUM: 9.3 mg/dL (ref 8.4–10.5)
CHLORIDE: 103 meq/L (ref 96–112)
CO2: 23 meq/L (ref 19–32)
CREATININE: 1.43 mg/dL — AB (ref 0.50–1.10)
GFR, EST AFRICAN AMERICAN: 45 mL/min — AB
GFR, Est Non African American: 39 mL/min — ABNORMAL LOW
Glucose, Bld: 98 mg/dL (ref 70–99)
Potassium: 4 mEq/L (ref 3.5–5.3)
Sodium: 141 mEq/L (ref 135–145)
Total Bilirubin: 0.7 mg/dL (ref 0.3–1.2)
Total Protein: 8 g/dL (ref 6.0–8.3)

## 2014-02-27 MED ORDER — PROMETHAZINE HCL 12.5 MG PO TABS: 12.5000 mg | ORAL_TABLET | Freq: Four times a day (QID) | ORAL | Status: DC | PRN

## 2014-02-27 NOTE — Progress Notes (Signed)
Subjective:    Patient ID: [name], female    DOB: April 23, 1950, 63 y.o.   MRN: [id number]  CC: Routine follow up.   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   [name] reports that she fell yesterday, lost her balance when coming down off the porch.  She did not get dizzy or lightheaded and she did not hit her head.  She did bang her left knee which has been causing her pain since that time. She has been able to walk.  No LOC.    [name] has had difficult to control heart failure and when she was last seen in the clinic, she was noted to be up 9 pounds, but [name] reports that she was not feeling any differently at that time.  Today, she is down 5 pounds to 164, which appears to be close to her dry weight (around 160?).  She denies any swelling, SOB.  She does note occasional lightheadedness when she stands and having to wait a few minutes before walking.  Today, her BP was low, but orthostatics were normal. She states she always feels cold.   Most concerning to [name] is that she has been having nausea and upset stomach after every meal.  She notes that her stomach always feels "sick" and that eating makes it worse.  She denies any pain in her stomach, diarrhea, constipation.  She continues to have BRBPR on occasion, most recent about 4 days ago.  She has been seen by GI for this before.  She had a colonoscopy which showed AVMs and it appears taht Dr. Deatra Ina was thinking about putting her on Lialda at his last visit in September.    Due to her low BP (normal for her?) and recent report of blood loss, stat CBC and CMET were ordered.     Review of Systems  Constitutional: Negative for fever, chills and fatigue.  HENT: Negative for ear discharge, ear pain and mouth sores.   Eyes: Negative for photophobia and visual disturbance.    Respiratory: Negative for cough, shortness of breath and wheezing.   Cardiovascular: Positive for leg swelling (mild). Negative for chest pain and palpitations.  Gastrointestinal: Positive for nausea and anal bleeding. Negative for vomiting, abdominal pain, diarrhea and constipation.  Genitourinary: Negative for dysuria, enuresis and difficulty urinating.  Musculoskeletal: Positive for gait problem. Negative for back pain and arthralgias.  Skin: Negative for rash and wound.  Neurological: Positive for light-headedness. Negative for syncope, facial asymmetry, speech difficulty and weakness.  Psychiatric/Behavioral: Negative for confusion and decreased concentration.       Objective:   Physical Exam  Constitutional: She is oriented to person, place, and time.  Elderly woman, appears older than stated age, very slow to answer questions.   HENT:  Head: Normocephalic and atraumatic.  Mouth/Throat: No oropharyngeal exudate.  Eyes: No scleral icterus.  Muddy sclera  Neck: Neck supple.  Cardiovascular: Normal rate and regular rhythm.   No murmur heard. Pulmonary/Chest: Effort normal and breath sounds normal. No respiratory distress. She has no wheezes. She has no rales.  Abdominal: Soft. Bowel sounds are normal. She exhibits no distension. There is no tenderness.  Musculoskeletal: She exhibits edema. She exhibits no tenderness.  Lymphadenopathy:    She has no cervical adenopathy.  Neurological: She is alert and oriented to person,  place, and time.  Skin: Skin is warm and dry.  Psychiatric: Her behavior is normal.  Mood normal for her, always with flat affect  Vitals reviewed.  Labs done revealed  BUN/Cr 52/1.43 - slightly worse, but likely within lab error, also on lasix and having nausea with eating ALP mildly elevated at 141 LFTs normal H/H 11/18/26.4, mildly decreased from previous.  NO blood loss today     Assessment & Plan:  RtC in 1 week for labs, 1 month for follow up.

## 2014-02-27 NOTE — Patient Instructions (Signed)
General Instructions: Please schedule a follow up visit within the next 1-2 months.   For your medications:   Please bring all of your pill  Bottles with you to each visit.  This will help make sure that we have an up to date list of all the medications you are taking.  Please also bring any over the counter herbal medications you are taking (not including advil, tylenol, etc.)  Please continue taking your medications for heart failure and lung disease as prescribed.  Please continue to weigh yourself daily and call the clinic if your weight increases > 3 pounds in 5 days.    You will have a new prescription for phenergan for your nausea.  You can take this 4 times per day, before meals and at night to help with your nausea.   For your knee pain, please go get an xray of the knee.  I will call you with the results.   I will also call you with your lab results.   Please call the clinic if you have any dizziness, lightheadedness, increased falling, fevers, chills, uncontrollable nausea even on the phenergan, vomiting, diarrhea, constipation.    Thank you!    Treatment Goals:  Goals (1 Years of Data) as of 02/27/14      Lifestyle   . Prevent Falls       Progress Toward Treatment Goals:  Treatment Goal 02/27/2014  Hemoglobin A1C at goal  Blood pressure unable to assess  Prevent falls unchanged    Self Care Goals & Plans:  Self Care Goal 02/27/2014  Manage my medications bring my medications to every visit; refill my medications on time; take my medicines as prescribed  Monitor my health keep track of my blood glucose; bring my glucose meter and log to each visit  Eat healthy foods eat more vegetables; eat foods that are low in salt; eat baked foods instead of fried foods  Be physically active -  Prevent falls wear appropriate shoes    Home Blood Glucose Monitoring 08/22/2013  Check my blood sugar once a day  When to check my blood sugar -     Care Management & Community  Referrals:  Referral 04/16/2013  Referrals made for care management support none needed  Referrals made to community resources -

## 2014-02-28 ENCOUNTER — Encounter (HOSPITAL_COMMUNITY): Payer: Medicare Other

## 2014-02-28 DIAGNOSIS — R110 Nausea: Secondary | ICD-10-CM | POA: Insufficient documentation

## 2014-02-28 NOTE — Assessment & Plan Note (Signed)
She is only taking lasix.  Blood pressure cannot tolerate BB or ACE-I.  Continue for now.  Repeat labs in 1 week.

## 2014-02-28 NOTE — Assessment & Plan Note (Signed)
I am a little concerned that this new symptom may represent low output, or a worsening of her gastroparesis.  Low output would be concerning as she is expressing being "always cold" and having low blood pressure would put her in the cold and dry category.  She may benefit from ionotropic therapy.  Would need further discussion with her cardiologist, who I believe she is due to see in January.  For today, I have prescribed her phenergan to try before meals and at night to see if this helps her eat more regularly.  She is amenable to this plan.  We will be checking labs again in 1 week for progressive worsening of her CBC and CMET.

## 2014-02-28 NOTE — Assessment & Plan Note (Signed)
Continue current therapy. She is insistent on not bringing in her medications and could not list them today, so I am not sure what she is taking.  She is supposed to be taking adcirca and letairis per her medication list

## 2014-02-28 NOTE — Assessment & Plan Note (Signed)
BP very low today.  She is only taking lasix, which will be continued for her heart failure.

## 2014-02-28 NOTE — Assessment & Plan Note (Signed)
Per biopsy report, she is positive for microscopic colitis and she was started on lialda, but I do not see this on her medication list and she is not aware of the medication.  Will discuss with her at next visit.

## 2014-02-28 NOTE — Assessment & Plan Note (Signed)
Renal function checked today and likely within her baseline.  Repeat labs in 1 week for worsening.

## 2014-03-11 ENCOUNTER — Emergency Department (HOSPITAL_COMMUNITY): Payer: Medicare Other

## 2014-03-11 ENCOUNTER — Inpatient Hospital Stay (HOSPITAL_COMMUNITY)
Admission: EM | Admit: 2014-03-11 | Discharge: 2014-03-25 | DRG: 545 | Disposition: A | Discharge status: Home / Self Care | Payer: Medicare Other | Attending: Emergency Medicine | Admitting: Emergency Medicine

## 2014-03-11 ENCOUNTER — Encounter (HOSPITAL_COMMUNITY): Payer: Self-pay | Admitting: Emergency Medicine

## 2014-03-11 DIAGNOSIS — K219 Gastro-esophageal reflux disease without esophagitis: Secondary | ICD-10-CM | POA: Diagnosis present

## 2014-03-11 DIAGNOSIS — I3139 Other pericardial effusion (noninflammatory): Secondary | ICD-10-CM | POA: Diagnosis present

## 2014-03-11 DIAGNOSIS — Z885 Allergy status to narcotic agent status: Secondary | ICD-10-CM

## 2014-03-11 DIAGNOSIS — Q2733 Arteriovenous malformation of digestive system vessel: Secondary | ICD-10-CM

## 2014-03-11 DIAGNOSIS — I501 Left ventricular failure: Secondary | ICD-10-CM | POA: Diagnosis present

## 2014-03-11 DIAGNOSIS — Z8249 Family history of ischemic heart disease and other diseases of the circulatory system: Secondary | ICD-10-CM

## 2014-03-11 DIAGNOSIS — I272 Other secondary pulmonary hypertension: Secondary | ICD-10-CM | POA: Diagnosis present

## 2014-03-11 DIAGNOSIS — Z79899 Other long term (current) drug therapy: Secondary | ICD-10-CM

## 2014-03-11 DIAGNOSIS — K31811 Angiodysplasia of stomach and duodenum with bleeding: Secondary | ICD-10-CM

## 2014-03-11 DIAGNOSIS — E1143 Type 2 diabetes mellitus with diabetic autonomic (poly)neuropathy: Secondary | ICD-10-CM | POA: Diagnosis present

## 2014-03-11 DIAGNOSIS — J9690 Respiratory failure, unspecified, unspecified whether with hypoxia or hypercapnia: Secondary | ICD-10-CM

## 2014-03-11 DIAGNOSIS — I5033 Acute on chronic diastolic (congestive) heart failure: Secondary | ICD-10-CM | POA: Diagnosis present

## 2014-03-11 DIAGNOSIS — J9620 Acute and chronic respiratory failure, unspecified whether with hypoxia or hypercapnia: Secondary | ICD-10-CM | POA: Diagnosis present

## 2014-03-11 DIAGNOSIS — E1142 Type 2 diabetes mellitus with diabetic polyneuropathy: Secondary | ICD-10-CM

## 2014-03-11 DIAGNOSIS — R7989 Other specified abnormal findings of blood chemistry: Secondary | ICD-10-CM | POA: Diagnosis present

## 2014-03-11 DIAGNOSIS — R0602 Shortness of breath: Secondary | ICD-10-CM | POA: Diagnosis not present

## 2014-03-11 DIAGNOSIS — Z881 Allergy status to other antibiotic agents status: Secondary | ICD-10-CM

## 2014-03-11 DIAGNOSIS — R0600 Dyspnea, unspecified: Secondary | ICD-10-CM

## 2014-03-11 DIAGNOSIS — K3184 Gastroparesis: Secondary | ICD-10-CM | POA: Diagnosis present

## 2014-03-11 DIAGNOSIS — Z90710 Acquired absence of both cervix and uterus: Secondary | ICD-10-CM

## 2014-03-11 DIAGNOSIS — Z9119 Patient's noncompliance with other medical treatment and regimen: Secondary | ICD-10-CM | POA: Diagnosis present

## 2014-03-11 DIAGNOSIS — M349 Systemic sclerosis, unspecified: Principal | ICD-10-CM | POA: Diagnosis present

## 2014-03-11 DIAGNOSIS — I959 Hypotension, unspecified: Secondary | ICD-10-CM | POA: Diagnosis not present

## 2014-03-11 DIAGNOSIS — D62 Acute posthemorrhagic anemia: Secondary | ICD-10-CM | POA: Diagnosis present

## 2014-03-11 DIAGNOSIS — I2781 Cor pulmonale (chronic): Secondary | ICD-10-CM | POA: Diagnosis present

## 2014-03-11 DIAGNOSIS — J90 Pleural effusion, not elsewhere classified: Secondary | ICD-10-CM | POA: Diagnosis present

## 2014-03-11 DIAGNOSIS — N183 Chronic kidney disease, stage 3 unspecified: Secondary | ICD-10-CM | POA: Diagnosis present

## 2014-03-11 DIAGNOSIS — Z79891 Long term (current) use of opiate analgesic: Secondary | ICD-10-CM

## 2014-03-11 DIAGNOSIS — Z91041 Radiographic dye allergy status: Secondary | ICD-10-CM

## 2014-03-11 DIAGNOSIS — F329 Major depressive disorder, single episode, unspecified: Secondary | ICD-10-CM | POA: Diagnosis present

## 2014-03-11 DIAGNOSIS — J189 Pneumonia, unspecified organism: Secondary | ICD-10-CM

## 2014-03-11 DIAGNOSIS — E781 Pure hyperglyceridemia: Secondary | ICD-10-CM | POA: Diagnosis present

## 2014-03-11 DIAGNOSIS — Z7982 Long term (current) use of aspirin: Secondary | ICD-10-CM

## 2014-03-11 DIAGNOSIS — I129 Hypertensive chronic kidney disease with stage 1 through stage 4 chronic kidney disease, or unspecified chronic kidney disease: Secondary | ICD-10-CM | POA: Diagnosis present

## 2014-03-11 DIAGNOSIS — I313 Pericardial effusion (noninflammatory): Secondary | ICD-10-CM | POA: Diagnosis present

## 2014-03-11 DIAGNOSIS — K921 Melena: Secondary | ICD-10-CM | POA: Diagnosis not present

## 2014-03-11 DIAGNOSIS — I2721 Secondary pulmonary arterial hypertension: Secondary | ICD-10-CM | POA: Diagnosis present

## 2014-03-11 DIAGNOSIS — M1990 Unspecified osteoarthritis, unspecified site: Secondary | ICD-10-CM | POA: Diagnosis present

## 2014-03-11 DIAGNOSIS — D649 Anemia, unspecified: Secondary | ICD-10-CM | POA: Diagnosis present

## 2014-03-11 DIAGNOSIS — Z9114 Patient's other noncompliance with medication regimen: Secondary | ICD-10-CM | POA: Diagnosis present

## 2014-03-11 DIAGNOSIS — J9600 Acute respiratory failure, unspecified whether with hypoxia or hypercapnia: Secondary | ICD-10-CM

## 2014-03-11 DIAGNOSIS — Z96653 Presence of artificial knee joint, bilateral: Secondary | ICD-10-CM | POA: Diagnosis present

## 2014-03-11 DIAGNOSIS — R188 Other ascites: Secondary | ICD-10-CM | POA: Diagnosis present

## 2014-03-11 LAB — BASIC METABOLIC PANEL
ANION GAP: 12 (ref 5–15)
BUN: 58 mg/dL — ABNORMAL HIGH (ref 6–23)
CO2: 20 mmol/L (ref 19–32)
CREATININE: 1.63 mg/dL — AB (ref 0.50–1.10)
Calcium: 9.5 mg/dL (ref 8.4–10.5)
Chloride: 110 mEq/L (ref 96–112)
GFR, EST AFRICAN AMERICAN: 38 mL/min — AB (ref >90)
GFR, EST NON AFRICAN AMERICAN: 33 mL/min — AB (ref >90)
Glucose, Bld: 156 mg/dL — ABNORMAL HIGH (ref 70–99)
Potassium: 4 mmol/L (ref 3.5–5.1)
SODIUM: 142 mmol/L (ref 135–145)

## 2014-03-11 LAB — CBC WITH DIFFERENTIAL/PLATELET
BASOS PCT: 0 % (ref 0–1)
Basophils Absolute: 0 10*3/uL (ref 0.0–0.1)
Eosinophils Absolute: 0.1 10*3/uL (ref 0.0–0.7)
Eosinophils Relative: 2 % (ref 0–5)
HCT: 28.1 % — ABNORMAL LOW (ref 36.0–46.0)
HEMOGLOBIN: 9.1 g/dL — AB (ref 12.0–15.0)
LYMPHS ABS: 1.6 10*3/uL (ref 0.7–4.0)
Lymphocytes Relative: 32 % (ref 12–46)
MCH: 26.8 pg (ref 26.0–34.0)
MCHC: 32.4 g/dL (ref 30.0–36.0)
MCV: 82.9 fL (ref 78.0–100.0)
Monocytes Absolute: 0.4 10*3/uL (ref 0.1–1.0)
Monocytes Relative: 9 % (ref 3–12)
NEUTROS PCT: 58 % (ref 43–77)
Neutro Abs: 2.9 10*3/uL (ref 1.7–7.7)
Platelets: 129 10*3/uL — ABNORMAL LOW (ref 150–400)
RBC: 3.39 MIL/uL — ABNORMAL LOW (ref 3.87–5.11)
RDW: 15 % (ref 11.5–15.5)
WBC: 5 10*3/uL (ref 4.0–10.5)

## 2014-03-11 LAB — I-STAT TROPONIN, ED: Troponin i, poc: 0.01 ng/mL (ref 0.00–0.08)

## 2014-03-11 LAB — BRAIN NATRIURETIC PEPTIDE: B NATRIURETIC PEPTIDE 5: 196 pg/mL — AB (ref 0.0–100.0)

## 2014-03-11 MED ORDER — SODIUM CHLORIDE 0.9 % IV BOLUS (SEPSIS): 1000.0000 mL | Freq: Once | INTRAVENOUS | Status: DC

## 2014-03-11 MED ORDER — SODIUM CHLORIDE 0.9 % IV BOLUS (SEPSIS)
500.0000 mL | Freq: Once | INTRAVENOUS | Status: AC
  Administered 2014-03-11: 500 mL via INTRAVENOUS

## 2014-03-11 NOTE — ED Notes (Signed)
Portable x-ray at the bedside.

## 2014-03-11 NOTE — ED Notes (Signed)
Reviewed bnp of 196 with Marissa, PA-C, and clarified bolus of normal saline. She acknowledges, patient is to receive 540m normal saline bolus.

## 2014-03-11 NOTE — ED Provider Notes (Signed)
CSN: [id number]     Arrival date & time 03/11/14  1908 History   First MD Initiated Contact with Patient 03/11/14 2106     Chief Complaint  Patient presents with  . Shortness of Breath    The patiet said she started having SOB on Sunday and then she began having chest pain.  The patient was not wanting to come but her family brought her to be seen.  . Chest Pain     (Consider location/radiation/quality/duration/timing/severity/associated sxs/prior Treatment) The history is provided by the patient. No language interpreter was used.  [name] is a 63 y/o F with PMHx of pulmonary HTN, cough, esophageal reflux disease, gastritis, trich, abnormal vaginal bleeding, CHF, DM II presenting to the ED with shortness of breath and chest pain that started last night. Patient reported that she has been experiencing chest pain localized to the center of her chest as well as all over her chest described as a soreness. Patient reported that she has been having a tightness sensation to her abdomen. Reported that she has been passing gas and had a normal bowel movement today. Reported that she took a Bayer ASA 81 mg this morning. Stated that she did not want to come here today, but stated that her family made her come. Denied cough, fever, chills, melena, hematochezia, nausea, vomiting, syncope, dizziness, headache. PCP Dr. Daryll Drown Cardiology Dr. Harrington Challenger  Past Medical History  Diagnosis Date  . Secondary pulmonary hypertension     right heart cath 04/20/04  . Cough   . Allergic rhinitis, cause unspecified   . Esophageal reflux   . Systemic sclerosis   . Unspecified essential hypertension   . Gastritis   . Sickle cell trait     "trace"  . Obesity   . Visual changes   . Scleroderma   . Diastolic dysfunction   . Trichomonas   . Vaginal bleeding   . Neuropathy   . CHF (congestive heart failure)   . Type II diabetes mellitus     DIET CONTROL   . Bursitis   . History of blood transfusion     "think  it was related to when I had partial hysteretomy; might have been for one of my knee ORs"  . Degeneration of lumbar or lumbosacral intervertebral disc   . Arthritis     "all over my body" (08/04/2013)  . Chronic kidney disease     "been in hospital for it; don't know what kind" (08/04/2013)   Past Surgical History  Procedure Laterality Date  . Tubal ligation    . Esophagogastroduodenoscopy  02/23/2010    multiple  . Shoulder arthroscopy Right 12/2009    subacromial decompression  . Replacement total knee Left 11/2000  . Cardiac catheterization Right 04/24/2004  . Tonsillectomy  1960's  . Abdominal hysterectomy  1983    "partial"  . Joint replacement    . Shoulder arthroscopy w/ rotator cuff repair Right 01/2010  . Excisional total knee arthroplasty with antibiotic spacers Left 08/2010    "got infected & had to take 1st replacement out"  . Revision total knee arthroplasty Left 11/2010    "removed spacers; replaced knee"  . Total knee revision with scar debridement/patella revision with poly exchange Left 12/2010    fell; knee split opened; had to redo revision"  . Cardiac catheterization Left 07/2010  . Peripherally inserted central catheter insertion  09/2010  . Colonoscopy  09/06/2008    normal----Dr. Lajoyce Corners    Family History  Problem  Relation Age of Onset  . Heart disease Mother   . Diabetes Mother   . Diabetes Sister    History  Substance Use Topics  . Smoking status: Never Smoker   . Smokeless tobacco: Never Used  . Alcohol Use: No   OB History    Gravida Para Term Preterm AB TAB SAB Ectopic Multiple Living   _0 Review of Systems  Constitutional: Positive for fatigue. Negative for fever and chills.  Respiratory: Positive for shortness of breath. Negative for cough and chest tightness.   Cardiovascular: Positive for chest pain.  Gastrointestinal: Positive for abdominal pain. Negative for nausea, vomiting, diarrhea, constipation, blood in stool and anal  bleeding.  Musculoskeletal: Positive for back pain. Negative for neck pain and neck stiffness.  Neurological: Negative for dizziness, weakness and headaches.      Allergies  Cephalexin; Ciprofloxacin; Codeine; Contrast media; and Iohexol  Home Medications   Prior to Admission medications   Medication Sig Start Date End Date Taking? Authorizing Provider  ambrisentan (LETAIRIS) 5 MG tablet Take 5 mg by mouth daily.   Yes Historical Provider, MD  aspirin EC 81 MG EC tablet Take 1 tablet (81 mg total) by mouth daily. 06/14/13  Yes Rande Brunt, NP  esomeprazole (NEXIUM) 40 MG capsule Take by mouth 2 (two) times daily.     Yes Historical Provider, MD  fenofibrate (TRICOR) 48 MG tablet Take 48 mg by mouth daily.   Yes Historical Provider, MD  ferrous sulfate ([facility] IRON) 325 (65 FE) MG tablet Take 325 mg by mouth daily with breakfast.   Yes Historical Provider, MD  FLUoxetine (PROZAC) 20 MG capsule take 1 capsule by mouth every morning 01/14/14  Yes Sid Falcon, MD  furosemide (LASIX) 40 MG tablet Take 1 tablet (40 mg total) by mouth 2 (two) times daily. 01/17/14  Yes Rande Brunt, NP  gabapentin (NEURONTIN) 100 MG capsule Take 1 capsule (100 mg total) by mouth 2 (two) times daily. 08/17/13  Yes Dennie Bible, NP  HYDROcodone-acetaminophen [facility]) 10-325 MG per tablet Take 1 tablet by mouth every 6 (six) hours as needed for moderate pain.  07/17/12  Yes Historical Provider, MD  pregabalin (LYRICA) 150 MG capsule Take 1 capsule (150 mg total) by mouth 3 (three) times daily. 08/17/13  Yes Dennie Bible, NP  promethazine (PHENERGAN) 12.5 MG tablet Take 1 tablet (12.5 mg total) by mouth every 6 (six) hours as needed for nausea or vomiting. 02/27/14  Yes Sid Falcon, MD  Tadalafil, PAH, (ADCIRCA) 20 MG TABS Take 40 mg by mouth daily.   Yes Historical Provider, MD   BP 85/57 mmHg  Pulse 76  Temp(Src) 98.2 F (36.8 C) (Oral)  Resp 18  SpO2 96% Physical Exam  Constitutional: She is  oriented to person, place, and time. She appears well-developed and well-nourished.  Lethargic appearing  HENT:  Head: Normocephalic and atraumatic.  Eyes: Conjunctivae and EOM are normal. Pupils are equal, round, and reactive to light. Right eye exhibits no discharge. Left eye exhibits no discharge.  Neck: Normal range of motion. Neck supple. No tracheal deviation present.  Cardiovascular: Normal rate, regular rhythm and normal heart sounds.  Exam reveals no friction rub.   No murmur heard. Pulmonary/Chest: Effort normal. No respiratory distress. She has no wheezes. She has no rales.  Patient taking deep breaths  Negative sue of accessory muscles Negative stridor  Abdominal: Soft. She  exhibits distension. There is tenderness. There is no rebound and no guarding.  Abdominal distension    Musculoskeletal: Normal range of motion.  Lymphadenopathy:    She has no cervical adenopathy.  Neurological: She is alert and oriented to person, place, and time. No cranial nerve deficit. She exhibits normal muscle tone. Coordination normal.  Skin: Skin is warm and dry. No rash noted. No erythema.  Psychiatric: She has a normal mood and affect. Her behavior is normal. Thought content normal.  Nursing note and vitals reviewed.   ED Course  Procedures (including critical care time)  Results for orders placed or performed during the hospital encounter of 40/98/11  Basic metabolic panel  Result Value Ref Range   Sodium 142 135 - 145 mmol/L   Potassium 4.0 3.5 - 5.1 mmol/L   Chloride 110 96 - 112 mEq/L   CO2 20 19 - 32 mmol/L   Glucose, Bld 156 (H) 70 - 99 mg/dL   BUN 58 (H) 6 - 23 mg/dL   Creatinine, Ser 1.63 (H) 0.50 - 1.10 mg/dL   Calcium 9.5 8.4 - 10.5 mg/dL   GFR calc non Af Amer 33 (L) >90 mL/min   GFR calc Af Amer 38 (L) >90 mL/min   Anion gap 12 5 - 15  BNP (order ONLY if patient complains of dyspnea/SOB AND you have documented it for THIS visit)  Result Value Ref Range   B Natriuretic  Peptide 196.0 (H) 0.0 - 100.0 pg/mL  CBC with Differential  Result Value Ref Range   WBC 5.0 4.0 - 10.5 K/uL   RBC 3.39 (L) 3.87 - 5.11 MIL/uL   Hemoglobin 9.1 (L) 12.0 - 15.0 g/dL   HCT 28.1 (L) 36.0 - 46.0 %   MCV 82.9 78.0 - 100.0 fL   MCH 26.8 26.0 - 34.0 pg   MCHC 32.4 30.0 - 36.0 g/dL   RDW 15.0 11.5 - 15.5 %   Platelets 129 (L) 150 - 400 K/uL   Neutrophils Relative % 58 43 - 77 %   Neutro Abs 2.9 1.7 - 7.7 K/uL   Lymphocytes Relative 32 12 - 46 %   Lymphs Abs 1.6 0.7 - 4.0 K/uL   Monocytes Relative 9 3 - 12 %   Monocytes Absolute 0.4 0.1 - 1.0 K/uL   Eosinophils Relative 2 0 - 5 %   Eosinophils Absolute 0.1 0.0 - 0.7 K/uL   Basophils Relative 0 0 - 1 %   Basophils Absolute 0.0 0.0 - 0.1 K/uL  Troponin I  Result Value Ref Range   Troponin I <0.03 <0.031 ng/mL  I-stat troponin, ED (not at [facility]  Result Value Ref Range   Troponin i, poc 0.01 0.00 - 0.08 ng/mL   Comment 3            Labs Review Labs Reviewed  BASIC METABOLIC PANEL - Abnormal; Notable for the following:    Glucose, Bld 156 (*)    BUN 58 (*)    Creatinine, Ser 1.63 (*)    GFR calc non Af Amer 33 (*)    GFR calc Af Amer 38 (*)    All other components within normal limits  BRAIN NATRIURETIC PEPTIDE - Abnormal; Notable for the following:    B Natriuretic Peptide 196.0 (*)    All other components within normal limits  CBC WITH DIFFERENTIAL - Abnormal; Notable for the following:    RBC 3.39 (*)    Hemoglobin 9.1 (*)    HCT 28.1 (*)  Platelets 129 (*)    All other components within normal limits  TROPONIN I  I-STAT TROPOININ, ED    Imaging Review Ct Abdomen Pelvis Wo Contrast  03/11/2014   CLINICAL DATA:  Initial evaluation for shortness of breath, abdominal pain.  EXAM: CT ABDOMEN AND PELVIS WITHOUT CONTRAST  TECHNIQUE: Multidetector CT imaging of the abdomen and pelvis was performed following the standard protocol without IV contrast.  COMPARISON:  None.  FINDINGS: Bilateral pleural effusions are  partially visualized, right greater than left. There is associated bibasilar atelectasis. Moderate to large pericardial effusion is partially visualized.  Scattered cystic lesions within the right hepatic lobe are stable. Liver is otherwise unremarkable. Gallbladder within normal limits. No biliary dilatation. Spleen, adrenal glands, and pancreas demonstrate a normal unenhanced appearance.  Kidneys are equal in size without evidence of nephrolithiasis or hydronephrosis.  Distal esophagus is somewhat patulous. Stomach within normal limits. No evidence for bowel obstruction. No abnormal wall thickening or inflammatory changes seen about the bowels.  Bladder within normal limits. Uterus and ovaries are not visualized.  Small volume free fluid seen adjacent to the liver.  No free air.  Retroperitoneal adenopathy again seen, similar to prior. Again, the most prominent node is seen in the left periaortic region and 1.5 cm in short axis (series 2, image 50, previously 1.6 cm). Left iliac node measures up to 1.3 cm in short axis (series 2, image 73). There are additional mildly prominent but not enlarged iliac nodes bilaterally. No pathologically enlarged inguinal lymph nodes identified. Overall, the degree of adenopathy is stable to slightly decreased from prior study.  No acute osseous abnormality. No worrisome lytic or blastic osseous lesions.  Mild diffuse anasarca.  IMPRESSION: 1. Moderate pericardial effusion with small bilateral pleural effusions, right greater than left, and small volume ascites. 2. Stable to slightly decreased size of retroperitoneal and iliac adenopathy as above. These nodes are indeterminate. Continued attention on followup is recommended. Additionally, correlation with PET-CT could be considered for further evaluation. 3. No other acute intra-abdominal pelvic process identified.   Electronically Signed   By: Jeannine Boga M.D.   On: 03/11/2014 23:59   Dg Chest 2 View  03/11/2014    CLINICAL DATA:  Chest pain and shortness of breath.  EXAM: CHEST  2 VIEW  COMPARISON:  Chest radiograph 12/26/2013  FINDINGS: Marked cardiomegaly. Bilateral perihilar and diffuse interstitial pulmonary opacities. No definite pleural effusion or pneumothorax. Mid thoracic spine degenerative change. Prominent small bowel loops, incompletely visualized.  IMPRESSION: Cardiomegaly and mild interstitial pulmonary edema.  Prominent upper abdominal small bowel loops, incompletely visualized. Consider correlation with dedicated abdominal radiography.   Electronically Signed   By: Lovey Newcomer M.D.   On: 03/11/2014 21:15   Dg Abd Portable 1v  03/11/2014   CLINICAL DATA:  One day history of abdominal pain  EXAM: PORTABLE ABDOMEN - 1 VIEW  COMPARISON:  June 07, 2013  FINDINGS: There is moderate stool in the colon. Bowel gas pattern is unremarkable. No obstruction or free air is seen on this supine examination. There are small phleboliths in the pelvis. Liver appears prominent but stable.  IMPRESSION: Bowel gas pattern unremarkable.  Liver prominent but stable.   Electronically Signed   By: Lowella Grip M.D.   On: 03/11/2014 21:45     EKG Interpretation   Date/Time:  Monday March 11 2014 19:14:12 EST Ventricular Rate:  89 PR Interval:  174 QRS Duration: 88 QT Interval:  386 QTC Calculation: 469 R Axis:  137 Text Interpretation:  Sinus rhythm with Fusion complexes Right axis  deviation Pulmonary disease pattern Possible Right ventricular hypertrophy  Abnormal ECG Confirmed by Lacinda Axon  MD, BRIAN ([id number]) on 03/11/2014 10:04:53  PM       12:49 AM This provider spoke with Dr. Elton Sin, Internal Medicine Teaching Services Residents. Discussed case in great detail, labs, imaging, ED course, vitals in great detail. Patient to be admitted to Internal Medicine Teaching Services under the care of Dr. Marinda Elk.   1:49 AM Internal Medicine Teaching Services at bedside assessing patient.   MDM   Final  diagnoses:  Shortness of breath  Pericardial effusion  Pleural effusion  Ascites    Medications  sodium chloride 0.9 % bolus 500 mL (0 mLs Intravenous Stopped 03/11/14 2204)    Filed Vitals:   03/12/14 0030 03/12/14 0045 03/12/14 0115 03/12/14 0145  BP: 105/65 1_0  Pulse: 78 75 75 76  Temp:      TempSrc:      Resp: _1 SpO2: 97% 95% 96% 96%   EKG sinus rhythm with a heart rate of 89 bpm with fusion complexes, right axis deviation. I-STAT troponin negative elevation. CBC negative elevated WBC. Hgb 9.1, Hct 28.1. BNP noted mildly elevated BUN of 58 and creatinine 1.63-patient has had creatinine as high as 1.44 in the past. BNP mildly elevated at 196.0. Negative findings of fluid overload. Chest x-ray noted cord megaly with mild interstitial pulmonary edema. Prominent upper abdominal small bowel loops incompletely visualized-plain film of abdomen unremarkable. CT abdomen and pelvis without contrast noted pericardial effusion with small bilateral pleural effusions, right greater than left with small volume ascites. Stable to slightly decreased size of retroperitoneal and iliac adenopathy. Patient became mildly hypotensive while in the ED setting. Patient alert and oriented. Patient has history of hypotension normally when compared to blood pressures when patient was in the hospital on 02/27/2014. Given IV fluids of 250 in the ED setting.  Patient presenting to the ED with increased shortness of breath - imaging noted ascites, pleural effusions bilaterally with right greater than the left, and pericardial effusion. Patient to be admitted to the hospital. Discussed plan for admission in great detail with Internal Medicine Teaching Services. Patient to be admitted to Telemetry. Discussed plan for admission with patient and family - agree to plan of care. Patient stable for tranfers.   Jamse Mead, PA-C 03/12/14 0737  Nat Christen, MD 03/12/14 [phone]

## 2014-03-11 NOTE — ED Notes (Addendum)
The patiet said she started having SOB on Sunday and then she began having chest pain.  The patient was not wanting to come but her family brought her to be seen.  She is dizzy, SOB, and lightheaded but denies any other symptoms.  She did take a baby aspirin earlier.

## 2014-03-11 NOTE — ED Notes (Signed)
Marissa, pa-c, at the bedside.

## 2014-03-12 ENCOUNTER — Inpatient Hospital Stay (HOSPITAL_COMMUNITY): Payer: Medicare Other

## 2014-03-12 DIAGNOSIS — Z885 Allergy status to narcotic agent status: Secondary | ICD-10-CM | POA: Diagnosis not present

## 2014-03-12 DIAGNOSIS — I959 Hypotension, unspecified: Secondary | ICD-10-CM | POA: Diagnosis not present

## 2014-03-12 DIAGNOSIS — Z7982 Long term (current) use of aspirin: Secondary | ICD-10-CM | POA: Diagnosis not present

## 2014-03-12 DIAGNOSIS — I517 Cardiomegaly: Secondary | ICD-10-CM | POA: Diagnosis not present

## 2014-03-12 DIAGNOSIS — R7989 Other specified abnormal findings of blood chemistry: Secondary | ICD-10-CM | POA: Diagnosis present

## 2014-03-12 DIAGNOSIS — K31811 Angiodysplasia of stomach and duodenum with bleeding: Secondary | ICD-10-CM | POA: Diagnosis not present

## 2014-03-12 DIAGNOSIS — Z79899 Other long term (current) drug therapy: Secondary | ICD-10-CM | POA: Diagnosis not present

## 2014-03-12 DIAGNOSIS — I3139 Other pericardial effusion (noninflammatory): Secondary | ICD-10-CM | POA: Diagnosis present

## 2014-03-12 DIAGNOSIS — Z881 Allergy status to other antibiotic agents status: Secondary | ICD-10-CM | POA: Diagnosis not present

## 2014-03-12 DIAGNOSIS — K3184 Gastroparesis: Secondary | ICD-10-CM | POA: Diagnosis present

## 2014-03-12 DIAGNOSIS — I509 Heart failure, unspecified: Secondary | ICD-10-CM | POA: Diagnosis not present

## 2014-03-12 DIAGNOSIS — E1143 Type 2 diabetes mellitus with diabetic autonomic (poly)neuropathy: Secondary | ICD-10-CM

## 2014-03-12 DIAGNOSIS — J9621 Acute and chronic respiratory failure with hypoxia: Secondary | ICD-10-CM | POA: Diagnosis not present

## 2014-03-12 DIAGNOSIS — J189 Pneumonia, unspecified organism: Secondary | ICD-10-CM | POA: Diagnosis not present

## 2014-03-12 DIAGNOSIS — R079 Chest pain, unspecified: Secondary | ICD-10-CM | POA: Insufficient documentation

## 2014-03-12 DIAGNOSIS — E781 Pure hyperglyceridemia: Secondary | ICD-10-CM | POA: Diagnosis present

## 2014-03-12 DIAGNOSIS — M1990 Unspecified osteoarthritis, unspecified site: Secondary | ICD-10-CM | POA: Diagnosis present

## 2014-03-12 DIAGNOSIS — Z9114 Patient's other noncompliance with medication regimen: Secondary | ICD-10-CM | POA: Diagnosis present

## 2014-03-12 DIAGNOSIS — Z96653 Presence of artificial knee joint, bilateral: Secondary | ICD-10-CM | POA: Diagnosis present

## 2014-03-12 DIAGNOSIS — I2781 Cor pulmonale (chronic): Secondary | ICD-10-CM | POA: Diagnosis present

## 2014-03-12 DIAGNOSIS — Q2733 Arteriovenous malformation of digestive system vessel: Secondary | ICD-10-CM | POA: Diagnosis not present

## 2014-03-12 DIAGNOSIS — I319 Disease of pericardium, unspecified: Secondary | ICD-10-CM | POA: Diagnosis not present

## 2014-03-12 DIAGNOSIS — R0602 Shortness of breath: Secondary | ICD-10-CM | POA: Diagnosis present

## 2014-03-12 DIAGNOSIS — E1142 Type 2 diabetes mellitus with diabetic polyneuropathy: Secondary | ICD-10-CM

## 2014-03-12 DIAGNOSIS — D649 Anemia, unspecified: Secondary | ICD-10-CM

## 2014-03-12 DIAGNOSIS — I313 Pericardial effusion (noninflammatory): Secondary | ICD-10-CM

## 2014-03-12 DIAGNOSIS — I272 Other secondary pulmonary hypertension: Secondary | ICD-10-CM | POA: Diagnosis not present

## 2014-03-12 DIAGNOSIS — K922 Gastrointestinal hemorrhage, unspecified: Secondary | ICD-10-CM | POA: Diagnosis not present

## 2014-03-12 DIAGNOSIS — I129 Hypertensive chronic kidney disease with stage 1 through stage 4 chronic kidney disease, or unspecified chronic kidney disease: Secondary | ICD-10-CM | POA: Diagnosis present

## 2014-03-12 DIAGNOSIS — I5033 Acute on chronic diastolic (congestive) heart failure: Secondary | ICD-10-CM | POA: Diagnosis present

## 2014-03-12 DIAGNOSIS — I501 Left ventricular failure: Secondary | ICD-10-CM | POA: Diagnosis present

## 2014-03-12 DIAGNOSIS — Z452 Encounter for adjustment and management of vascular access device: Secondary | ICD-10-CM | POA: Diagnosis not present

## 2014-03-12 DIAGNOSIS — R188 Other ascites: Secondary | ICD-10-CM | POA: Diagnosis present

## 2014-03-12 DIAGNOSIS — M7989 Other specified soft tissue disorders: Secondary | ICD-10-CM

## 2014-03-12 DIAGNOSIS — Z9119 Patient's noncompliance with other medical treatment and regimen: Secondary | ICD-10-CM | POA: Diagnosis present

## 2014-03-12 DIAGNOSIS — M349 Systemic sclerosis, unspecified: Secondary | ICD-10-CM | POA: Diagnosis not present

## 2014-03-12 DIAGNOSIS — J9620 Acute and chronic respiratory failure, unspecified whether with hypoxia or hypercapnia: Secondary | ICD-10-CM | POA: Diagnosis not present

## 2014-03-12 DIAGNOSIS — R918 Other nonspecific abnormal finding of lung field: Secondary | ICD-10-CM | POA: Diagnosis not present

## 2014-03-12 DIAGNOSIS — D62 Acute posthemorrhagic anemia: Secondary | ICD-10-CM | POA: Diagnosis not present

## 2014-03-12 DIAGNOSIS — J9811 Atelectasis: Secondary | ICD-10-CM | POA: Diagnosis not present

## 2014-03-12 DIAGNOSIS — I369 Nonrheumatic tricuspid valve disorder, unspecified: Secondary | ICD-10-CM

## 2014-03-12 DIAGNOSIS — N183 Chronic kidney disease, stage 3 (moderate): Secondary | ICD-10-CM

## 2014-03-12 DIAGNOSIS — K219 Gastro-esophageal reflux disease without esophagitis: Secondary | ICD-10-CM | POA: Diagnosis present

## 2014-03-12 DIAGNOSIS — F329 Major depressive disorder, single episode, unspecified: Secondary | ICD-10-CM | POA: Diagnosis present

## 2014-03-12 DIAGNOSIS — E1122 Type 2 diabetes mellitus with diabetic chronic kidney disease: Secondary | ICD-10-CM

## 2014-03-12 DIAGNOSIS — J9600 Acute respiratory failure, unspecified whether with hypoxia or hypercapnia: Secondary | ICD-10-CM | POA: Diagnosis not present

## 2014-03-12 DIAGNOSIS — Z90710 Acquired absence of both cervix and uterus: Secondary | ICD-10-CM | POA: Diagnosis not present

## 2014-03-12 DIAGNOSIS — J90 Pleural effusion, not elsewhere classified: Secondary | ICD-10-CM | POA: Diagnosis not present

## 2014-03-12 DIAGNOSIS — I270 Primary pulmonary hypertension: Secondary | ICD-10-CM

## 2014-03-12 DIAGNOSIS — Z91041 Radiographic dye allergy status: Secondary | ICD-10-CM | POA: Diagnosis not present

## 2014-03-12 DIAGNOSIS — Z8249 Family history of ischemic heart disease and other diseases of the circulatory system: Secondary | ICD-10-CM | POA: Diagnosis not present

## 2014-03-12 DIAGNOSIS — Z79891 Long term (current) use of opiate analgesic: Secondary | ICD-10-CM | POA: Diagnosis not present

## 2014-03-12 DIAGNOSIS — J811 Chronic pulmonary edema: Secondary | ICD-10-CM | POA: Diagnosis not present

## 2014-03-12 DIAGNOSIS — I5022 Chronic systolic (congestive) heart failure: Secondary | ICD-10-CM | POA: Diagnosis not present

## 2014-03-12 DIAGNOSIS — D500 Iron deficiency anemia secondary to blood loss (chronic): Secondary | ICD-10-CM | POA: Diagnosis not present

## 2014-03-12 DIAGNOSIS — K921 Melena: Secondary | ICD-10-CM | POA: Diagnosis not present

## 2014-03-12 LAB — TROPONIN I: Troponin I: <0.03 ng/mL (ref <0.031)

## 2014-03-12 LAB — CBC
HEMATOCRIT: 26 % — AB (ref 36.0–46.0)
Hemoglobin: 8.5 g/dL — ABNORMAL LOW (ref 12.0–15.0)
MCH: 26.2 pg (ref 26.0–34.0)
MCHC: 32.7 g/dL (ref 30.0–36.0)
MCV: 80.2 fL (ref 78.0–100.0)
Platelets: 126 10*3/uL — ABNORMAL LOW (ref 150–400)
RBC: 3.24 MIL/uL — ABNORMAL LOW (ref 3.87–5.11)
RDW: 14.9 % (ref 11.5–15.5)
WBC: 4.7 10*3/uL (ref 4.0–10.5)

## 2014-03-12 LAB — BASIC METABOLIC PANEL
ANION GAP: 5 (ref 5–15)
BUN: 56 mg/dL — AB (ref 6–23)
CALCIUM: 9.5 mg/dL (ref 8.4–10.5)
CO2: 25 mmol/L (ref 19–32)
CREATININE: 1.47 mg/dL — AB (ref 0.50–1.10)
Chloride: 110 mEq/L (ref 96–112)
GFR calc non Af Amer: 37 mL/min — ABNORMAL LOW (ref >90)
GFR, EST AFRICAN AMERICAN: 43 mL/min — AB (ref >90)
Glucose, Bld: 92 mg/dL (ref 70–99)
Potassium: 3.8 mmol/L (ref 3.5–5.1)
Sodium: 140 mmol/L (ref 135–145)

## 2014-03-12 LAB — HEPARIN LEVEL (UNFRACTIONATED)
HEPARIN UNFRACTIONATED: 0.13 [IU]/mL — AB (ref 0.30–0.70)
Heparin Unfractionated: 0.25 IU/mL — ABNORMAL LOW (ref 0.30–0.70)

## 2014-03-12 LAB — D-DIMER, QUANTITATIVE (NOT AT ARMC): D-Dimer, Quant: 2.35 ug/mL-FEU — ABNORMAL HIGH (ref 0.00–0.48)

## 2014-03-12 MED ORDER — PREGABALIN 75 MG PO CAPS
150.0000 mg | ORAL_CAPSULE | Freq: Three times a day (TID) | ORAL | Status: DC
  Administered 2014-03-12 – 2014-03-25 (×40): 150 mg via ORAL
  Filled 2014-03-12: qty 1
  Filled 2014-03-12 (×3): qty 2
  Filled 2014-03-12: qty 6
  Filled 2014-03-12: qty 1
  Filled 2014-03-12 (×4): qty 2
  Filled 2014-03-12: qty 6
  Filled 2014-03-12 (×4): qty 2
  Filled 2014-03-12: qty 6
  Filled 2014-03-12 (×3): qty 1
  Filled 2014-03-12: qty 2
  Filled 2014-03-12: qty 6
  Filled 2014-03-12: qty 2
  Filled 2014-03-12: qty 1
  Filled 2014-03-12: qty 2
  Filled 2014-03-12: qty 1
  Filled 2014-03-12: qty 2
  Filled 2014-03-12: qty 6
  Filled 2014-03-12: qty 2
  Filled 2014-03-12 (×2): qty 6
  Filled 2014-03-12: qty 2
  Filled 2014-03-12: qty 1
  Filled 2014-03-12: qty 6
  Filled 2014-03-12: qty 2
  Filled 2014-03-12: qty 6
  Filled 2014-03-12: qty 2
  Filled 2014-03-12: qty 6
  Filled 2014-03-12: qty 1
  Filled 2014-03-12: qty 6
  Filled 2014-03-12: qty 2
  Filled 2014-03-12: qty 1
  Filled 2014-03-12: qty 6
  Filled 2014-03-12 (×2): qty 1
  Filled 2014-03-12 (×3): qty 6

## 2014-03-12 MED ORDER — FERROUS SULFATE 325 (65 FE) MG PO TABS
325.0000 mg | ORAL_TABLET | Freq: Every day | ORAL | Status: DC
  Administered 2014-03-13 – 2014-03-22 (×10): 325 mg via ORAL
  Filled 2014-03-12 (×14): qty 1

## 2014-03-12 MED ORDER — HEPARIN BOLUS VIA INFUSION
1000.0000 [IU] | Freq: Once | INTRAVENOUS | Status: AC
  Administered 2014-03-12: 1000 [IU] via INTRAVENOUS
  Filled 2014-03-12: qty 1000

## 2014-03-12 MED ORDER — HYDROCODONE-ACETAMINOPHEN 10-325 MG PO TABS
1.0000 | ORAL_TABLET | Freq: Four times a day (QID) | ORAL | Status: DC | PRN
  Administered 2014-03-12 – 2014-03-24 (×25): 1 via ORAL
  Filled 2014-03-12 (×25): qty 1

## 2014-03-12 MED ORDER — SODIUM CHLORIDE 0.9 % IJ SOLN
3.0000 mL | Freq: Two times a day (BID) | INTRAMUSCULAR | Status: DC
  Administered 2014-03-12 – 2014-03-20 (×12): 3 mL via INTRAVENOUS
  Filled 2014-03-12: qty 3

## 2014-03-12 MED ORDER — HEPARIN BOLUS VIA INFUSION
3000.0000 [IU] | Freq: Once | INTRAVENOUS | Status: AC
  Filled 2014-03-12: qty 3000

## 2014-03-12 MED ORDER — MORPHINE SULFATE 2 MG/ML IJ SOLN
1.0000 mg | Freq: Once | INTRAMUSCULAR | Status: AC
Start: 2014-03-12 — End: 2014-03-12
  Administered 2014-03-12: 1 mg via INTRAVENOUS
  Filled 2014-03-12: qty 1

## 2014-03-12 MED ORDER — PANTOPRAZOLE SODIUM 40 MG PO TBEC
40.0000 mg | DELAYED_RELEASE_TABLET | Freq: Every day | ORAL | Status: DC
Start: 2014-03-12 — End: 2014-03-22
  Administered 2014-03-12 – 2014-03-22 (×11): 40 mg via ORAL
  Filled 2014-03-12 (×11): qty 1

## 2014-03-12 MED ORDER — ASPIRIN EC 81 MG PO TBEC
81.0000 mg | DELAYED_RELEASE_TABLET | Freq: Every day | ORAL | Status: DC
  Administered 2014-03-12 – 2014-03-21 (×10): 81 mg via ORAL
  Filled 2014-03-12 (×11): qty 1

## 2014-03-12 MED ORDER — FLUOXETINE HCL 20 MG PO CAPS
20.0000 mg | ORAL_CAPSULE | Freq: Every morning | ORAL | Status: DC
  Administered 2014-03-12 – 2014-03-25 (×14): 20 mg via ORAL
  Filled 2014-03-12 (×14): qty 1

## 2014-03-12 MED ORDER — DIPHENHYDRAMINE HCL 25 MG PO CAPS: 50.0000 mg | ORAL_CAPSULE | Freq: Once | ORAL | Status: DC

## 2014-03-12 MED ORDER — PREDNISONE 20 MG PO TABS
50.0000 mg | ORAL_TABLET | Freq: Once | ORAL | Status: AC
  Administered 2014-03-12: 50 mg via ORAL
  Filled 2014-03-12: qty 2
  Filled 2014-03-12: qty 1

## 2014-03-12 MED ORDER — HEPARIN (PORCINE) IN NACL 100-0.45 UNIT/ML-% IJ SOLN
1000.0000 [IU]/h | INTRAMUSCULAR | Status: DC
  Administered 2014-03-12: 800 [IU]/h via INTRAVENOUS
  Filled 2014-03-12 (×2): qty 250

## 2014-03-12 MED ORDER — ZOLPIDEM TARTRATE 5 MG PO TABS
5.0000 mg | ORAL_TABLET | Freq: Once | ORAL | Status: AC
  Administered 2014-03-12: 5 mg via ORAL
  Filled 2014-03-12: qty 1

## 2014-03-12 MED ORDER — SODIUM CHLORIDE 0.9 % IV BOLUS (SEPSIS)
500.0000 mL | Freq: Once | INTRAVENOUS | Status: AC
  Administered 2014-03-12: 500 mL via INTRAVENOUS

## 2014-03-12 MED ORDER — HEPARIN SODIUM (PORCINE) 5000 UNIT/ML IJ SOLN
5000.0000 [IU] | Freq: Three times a day (TID) | INTRAMUSCULAR | Status: DC
  Administered 2014-03-12 – 2014-03-19 (×19): 5000 [IU] via SUBCUTANEOUS
  Filled 2014-03-12 (×23): qty 1

## 2014-03-12 MED ORDER — AMBRISENTAN 5 MG PO TABS: 5.0000 mg | ORAL_TABLET | Freq: Every day | ORAL | Status: DC

## 2014-03-12 MED ORDER — PREDNISONE 20 MG PO TABS
50.0000 mg | ORAL_TABLET | Freq: Once | ORAL | Status: AC
  Administered 2014-03-12: 50 mg via ORAL
  Filled 2014-03-12: qty 3

## 2014-03-12 MED ORDER — FENOFIBRATE 54 MG PO TABS
54.0000 mg | ORAL_TABLET | Freq: Every day | ORAL | Status: DC
  Administered 2014-03-12 – 2014-03-25 (×14): 54 mg via ORAL
  Filled 2014-03-12 (×15): qty 1

## 2014-03-12 MED ORDER — TECHNETIUM TO 99M ALBUMIN AGGREGATED
6.0000 | Freq: Once | INTRAVENOUS | Status: AC | PRN
  Administered 2014-03-12: 6 via INTRAVENOUS

## 2014-03-12 MED ORDER — PREDNISONE 20 MG PO TABS: 50.0000 mg | ORAL_TABLET | Freq: Once | ORAL | Status: DC

## 2014-03-12 NOTE — ED Notes (Signed)
Myself and Ria Comment, RN placed pt on a bedpan with no success; pt was unable to urinate at this time; readjusted pt on stretcher and she is resting at this time

## 2014-03-12 NOTE — Progress Notes (Signed)
[place] for Heparin  Indication: DVT px  Allergies  Allergen Reactions  . Cephalexin Rash  . Ciprofloxacin Rash  . Codeine Other (See Comments)    REACTION: GI upset  . Contrast Media [Iodinated Diagnostic Agents] Hives  . Iohexol Hives     Code: HIVES, Desc: pt breaks out in large hives. needs full premeds, Onset Date: [id number]     Patient Measurements: 74.6 kg  Vital Signs: Temp: 97.9 F (36.6 C) (12/29 1924) Temp Source: Oral (12/29 1924) BP: 124/65 mmHg (12/29 1924) Pulse Rate: 77 (12/29 1924)  Labs:  Recent Labs  03/11/14 1927 03/11/14 2127 03/11/14 2355 03/12/14 0638 03/12/14 1154 03/12/14 1930  HGB  --  9.1*  --  8.5*  --   --   HCT  --  28.1*  --  26.0*  --   --   PLT  --  129*  --  126*  --   --   HEPARINUNFRC  --   --   --   --  0.13* 0.25*  CREATININE 1.63*  --   --  1.47*  --   --   TROPONINI  --   --  <0.03 <0.03  --   --     Estimated Creatinine Clearance: 39.7 mL/min (by C-G formula based on Cr of 1.47).   Medical History: Past Medical History  Diagnosis Date  . Secondary pulmonary hypertension     right heart cath 04/20/04  . Cough   . Allergic rhinitis, cause unspecified   . Esophageal reflux   . Systemic sclerosis   . Unspecified essential hypertension   . Gastritis   . Sickle cell trait     "trace"  . Obesity   . Visual changes   . Scleroderma   . Diastolic dysfunction   . Trichomonas   . Vaginal bleeding   . Neuropathy   . CHF (congestive heart failure)   . Type II diabetes mellitus     DIET CONTROL   . Bursitis   . History of blood transfusion     "think it was related to when I had partial hysteretomy; might have been for one of my knee ORs"  . Degeneration of lumbar or lumbosacral intervertebral disc   . Arthritis     "all over my body" (08/04/2013)  . Chronic kidney disease     "been in hospital for it; don't know what kind" (08/04/2013)   Assessment: 63 y/o F on heparin for  r/o PE. V/Q came back neg for PE. Team has decided to change IV heparin to SQ.    Plan:   Dc IV heparin Heparin 5000 units q8 Rx will sign off  Onnie Boer, PharmD Pager: [phone] 03/12/2014 8:44 PM

## 2014-03-12 NOTE — Progress Notes (Signed)
ANTICOAGULATION CONSULT NOTE - Initial Consult  Pharmacy Consult for Heparin  Indication: Concern for PE  Allergies  Allergen Reactions  . Cephalexin Rash  . Ciprofloxacin Rash  . Codeine Other (See Comments)    REACTION: GI upset  . Contrast Media [Iodinated Diagnostic Agents] Hives  . Iohexol Hives     Code: HIVES, Desc: pt breaks out in large hives. needs full premeds, Onset Date: [id number]     Patient Measurements: 74.6 kg  Vital Signs: Temp: 98.2 F (36.8 C) (12/28 1918) Temp Source: Oral (12/28 1918) BP: 106/68 mmHg (12/29 0245) Pulse Rate: 77 (12/29 0245)  Labs:  Recent Labs  03/11/14 1927 03/11/14 2127 03/11/14 2355  HGB  --  9.1*  --   HCT  --  28.1*  --   PLT  --  129*  --   CREATININE 1.63*  --   --   TROPONINI  --   --  <0.03    Estimated Creatinine Clearance: 35.4 mL/min (by C-G formula based on Cr of 1.63).   Medical History: Past Medical History  Diagnosis Date  . Secondary pulmonary hypertension     right heart cath 04/20/04  . Cough   . Allergic rhinitis, cause unspecified   . Esophageal reflux   . Systemic sclerosis   . Unspecified essential hypertension   . Gastritis   . Sickle cell trait     "trace"  . Obesity   . Visual changes   . Scleroderma   . Diastolic dysfunction   . Trichomonas   . Vaginal bleeding   . Neuropathy   . CHF (congestive heart failure)   . Type II diabetes mellitus     DIET CONTROL   . Bursitis   . History of blood transfusion     "think it was related to when I had partial hysteretomy; might have been for one of my knee ORs"  . Degeneration of lumbar or lumbosacral intervertebral disc   . Arthritis     "all over my body" (08/04/2013)  . Chronic kidney disease     "been in hospital for it; don't know what kind" (08/04/2013)   Assessment: 63 y/o F to start heparin for r/o PE. D-Dimer elevated at 2.35, Hgb 9.1, Scr 1.63, other labs as above.   Goal of Therapy:  Heparin level 0.3-0.7 units/ml Monitor  platelets by anticoagulation protocol: Yes   Plan:  -Heparin 3000 units BOLUS -Start heparin drip at 800 units/hr -1200 HL -Daily CBC/HL -Monitor for bleeding   Narda Bonds 03/12/2014,3:06 AM

## 2014-03-12 NOTE — ED Notes (Signed)
Manual bp reading 88/58, marissa, pa-c informed. No new orders. Admitting physician paged.

## 2014-03-12 NOTE — H&P (Signed)
Date: 03/12/2014               Patient Name:  [name] MRN: [id number]  DOB: 12/20/1950 Age / Sex: 63 y.o., female   PCP: Sid Falcon, MD              Medical Service: Internal Medicine Teaching Service              Attending Physician: Dr. Axel Filler, MD    First Contact: Dr. Posey Pronto Pager: [phone]  Second Contact: Dr. Denton Brick Pager: [phone]            After Hours (After 5p/  First Contact Pager: [phone]  weekends / holidays): Second Contact Pager: [phone]   Chief Complaint:  Shortness of breath  History of Present Illness: [name] is a 63 year old woman with history of scleroderma with systemic sclerosis and severe secondary pulmonary HTN and right heart failure, chronic mixed systolic/diastolic heart failure, DJD, and DM2 with gastroparesis and CKD presenting with shortness of breath.  She reports developing shortness of breath and chest pain last night.  She says the shortness of breath came on relatively suddenly when she was trying to sleep.  She noticed being unable to lay back and had to sit up to catch her breath.  Since that time, she has continued to have trouble breathing that is worse with exertion.  She also developed chest soreness in the center of her sternum that radiates to her right side and has been causing her abdominal tightness as well.  The chest pain is worse when she takes a deep breath.  She reports a dry cough that is chronic for her.  She says her symptoms are atypical for her, and she normally develops lower extremity edema when she is having heart failure.  She currently denies any abdominal distension or lower extremity swelling.  She had a fall on 02/26/14 and hit her left knee.  Since that time, she has been less active and has not been walking to avoid putting weight on it. She continues to have pain in her left leg and thinks that it is more swollen than her right leg.  She denies any blood in her stool or urine currently, but she  has had bright red blood per rectum in the past and had a colonoscopy that showed multiple AVMs.  She was hospitalized for congestive heart failure in 10/15 and was diuresed, and her Lasix was increased.  She has been seen in clinic several times over the last couple of months and her blood pressure has run in the 01U systolic.  In the ER, she was noted to have a low blood pressure of 96/57 and have an O2 sat of 92, and she was given 250 ml of NS and started on 2L nasal canula.   Review of Systems: Review of Systems  Constitutional: Negative for fever, chills and weight loss.  HENT: Negative for congestion and sore throat.   Eyes: Negative for blurred vision.  Respiratory: Positive for cough and shortness of breath. Negative for hemoptysis, sputum production and wheezing.   Cardiovascular: Positive for chest pain, orthopnea and leg swelling (Left leg.). Negative for palpitations and claudication.  Gastrointestinal: Positive for abdominal pain. Negative for heartburn, nausea, vomiting, diarrhea and constipation.  Genitourinary: Negative for dysuria.  Musculoskeletal: Negative for myalgias and joint pain.  Skin: Negative for rash.  Neurological: Positive for dizziness. Negative for sensory change, focal weakness, weakness and headaches.  Meds:  (Not in a hospital admission) Current Facility-Administered Medications  Medication Dose Route Frequency Provider Last Rate Last Dose  . sodium chloride 0.9 % bolus 500 mL  500 mL Intravenous Once Marjan Rabbani, MD 500 mL/hr at 03/12/14 0224 500 mL at 03/12/14 4782   Current Outpatient Prescriptions  Medication Sig Dispense Refill  . ambrisentan (LETAIRIS) 5 MG tablet Take 5 mg by mouth daily.    Marland Kitchen aspirin EC 81 MG EC tablet Take 1 tablet (81 mg total) by mouth daily. 30 tablet 3  . esomeprazole (NEXIUM) 40 MG capsule Take by mouth 2 (two) times daily.      . fenofibrate (TRICOR) 48 MG tablet Take 48 mg by mouth daily.    . ferrous sulfate ([facility]  IRON) 325 (65 FE) MG tablet Take 325 mg by mouth daily with breakfast.    . FLUoxetine (PROZAC) 20 MG capsule take 1 capsule by mouth every morning 30 capsule 2  . furosemide (LASIX) 40 MG tablet Take 1 tablet (40 mg total) by mouth 2 (two) times daily. 60 tablet 3  . gabapentin (NEURONTIN) 100 MG capsule Take 1 capsule (100 mg total) by mouth 2 (two) times daily. 60 capsule 11  . HYDROcodone-acetaminophen (NORCO) 10-325 MG per tablet Take 1 tablet by mouth every 6 (six) hours as needed for moderate pain.     . pregabalin (LYRICA) 150 MG capsule Take 1 capsule (150 mg total) by mouth 3 (three) times daily. 90 capsule 5  . promethazine (PHENERGAN) 12.5 MG tablet Take 1 tablet (12.5 mg total) by mouth every 6 (six) hours as needed for nausea or vomiting. 45 tablet 1  . Tadalafil, PAH, (ADCIRCA) 20 MG TABS Take 40 mg by mouth daily.      Allergies: Allergies as of 03/11/2014 - Review Complete 03/11/2014  Allergen Reaction Noted  . Cephalexin Rash 05/01/2010  . Ciprofloxacin Rash 07/23/2010  . Codeine Other (See Comments)   . Contrast media [iodinated diagnostic agents] Hives 08/03/2011  . Iohexol Hives 03/04/2008   Past Medical History  Diagnosis Date  . Secondary pulmonary hypertension     right heart cath 04/20/04  . Cough   . Allergic rhinitis, cause unspecified   . Esophageal reflux   . Systemic sclerosis   . Unspecified essential hypertension   . Gastritis   . Sickle cell trait     "trace"  . Obesity   . Visual changes   . Scleroderma   . Diastolic dysfunction   . Trichomonas   . Vaginal bleeding   . Neuropathy   . CHF (congestive heart failure)   . Type II diabetes mellitus     DIET CONTROL   . Bursitis   . History of blood transfusion     "think it was related to when I had partial hysteretomy; might have been for one of my knee ORs"  . Degeneration of lumbar or lumbosacral intervertebral disc   . Arthritis     "all over my body" (08/04/2013)  . Chronic kidney disease      "been in hospital for it; don't know what kind" (08/04/2013)   Past Surgical History  Procedure Laterality Date  . Tubal ligation    . Esophagogastroduodenoscopy  02/23/2010    multiple  . Shoulder arthroscopy Right 12/2009    subacromial decompression  . Replacement total knee Left 11/2000  . Cardiac catheterization Right 04/24/2004  . Tonsillectomy  1960's  . Abdominal hysterectomy  1983    "partial"  . Joint  replacement    . Shoulder arthroscopy w/ rotator cuff repair Right 01/2010  . Excisional total knee arthroplasty with antibiotic spacers Left 08/2010    "got infected & had to take 1st replacement out"  . Revision total knee arthroplasty Left 11/2010    "removed spacers; replaced knee"  . Total knee revision with scar debridement/patella revision with poly exchange Left 12/2010    fell; knee split opened; had to redo revision"  . Cardiac catheterization Left 07/2010  . Peripherally inserted central catheter insertion  09/2010  . Colonoscopy  09/06/2008    normal----Dr. Lajoyce Corners    Family History  Problem Relation Age of Onset  . Heart disease Mother   . Diabetes Mother   . Diabetes Sister    History   Social History  . Marital Status: Legally Separated    Spouse Name: N/A    Number of Children: 2  . Years of Education: 11   Occupational History  . Umemployed   . Disability     Social History Main Topics  . Smoking status: Never Smoker   . Smokeless tobacco: Never Used  . Alcohol Use: No  . Drug Use: No  . Sexual Activity: Not on file   Other Topics Concern  . Not on file   Social History Narrative    FAMILY HISTORY:  Significant for coronary artery disease and diabetes   Patient lives at home with granddaughter.    Patient has 2 children.    Patient has 11 years of education.    Patient is on disability.    Patient is right handed.    Patient is separated.      Physical Exam: Filed Vitals:   03/12/14 0015  BP: 106/58  Pulse: 75  Temp: 98.2  Resp:  14   Physical Exam  Constitutional: She is oriented to person, place, and time and well-developed, well-nourished, and in no distress. No distress.  HENT:  Head: Normocephalic and atraumatic.  Puffy face.  Eyes: Conjunctivae and EOM are normal. Pupils are equal, round, and reactive to light. No scleral icterus.  Neck: Normal range of motion. Neck supple.  Cardiovascular: Normal rate, regular rhythm and intact distal pulses.  Exam reveals no gallop and no friction rub.   No murmur heard. JVD to jaw at 30 degrees.  Pulmonary/Chest: Effort normal. No respiratory distress. She has no rales. She exhibits no tenderness.  Bilateral rhonci, decreased breath sounds.  Abdominal: Soft. Bowel sounds are normal. She exhibits no distension. There is tenderness (Epigastric.). There is no rebound and no guarding.  Musculoskeletal: Normal range of motion. She exhibits tenderness (Palpation of left calf below knee.).  Unilateral swelling of left calf.  Scar from previous knee surgery.  Neurological: She is alert and oriented to person, place, and time. No cranial nerve deficit. She exhibits normal muscle tone.  Skin: Skin is warm and dry. No rash noted. She is not diaphoretic. No erythema.    Lab results: Basic Metabolic Panel:  Recent Labs  03/11/14 1927  NA 142  K 4.0  CL 110  CO2 20  GLUCOSE 156*  BUN 58*  CREATININE 1.63*  CALCIUM 9.5   CBC:  Recent Labs  03/11/14 2127  WBC 5.0  NEUTROABS 2.9  HGB 9.1*  HCT 28.1*  MCV 82.9  PLT 129*   Cardiac Enzymes:  Recent Labs  03/11/14 2355  TROPONINI <0.03    Imaging results:  Ct Abdomen Pelvis Wo Contrast  03/11/2014   CLINICAL DATA:  Initial  evaluation for shortness of breath, abdominal pain.  EXAM: CT ABDOMEN AND PELVIS WITHOUT CONTRAST  TECHNIQUE: Multidetector CT imaging of the abdomen and pelvis was performed following the standard protocol without IV contrast.  COMPARISON:  None.  FINDINGS: Bilateral pleural effusions are  partially visualized, right greater than left. There is associated bibasilar atelectasis. Moderate to large pericardial effusion is partially visualized.  Scattered cystic lesions within the right hepatic lobe are stable. Liver is otherwise unremarkable. Gallbladder within normal limits. No biliary dilatation. Spleen, adrenal glands, and pancreas demonstrate a normal unenhanced appearance.  Kidneys are equal in size without evidence of nephrolithiasis or hydronephrosis.  Distal esophagus is somewhat patulous. Stomach within normal limits. No evidence for bowel obstruction. No abnormal wall thickening or inflammatory changes seen about the bowels.  Bladder within normal limits. Uterus and ovaries are not visualized.  Small volume free fluid seen adjacent to the liver.  No free air.  Retroperitoneal adenopathy again seen, similar to prior. Again, the most prominent node is seen in the left periaortic region and 1.5 cm in short axis (series 2, image 50, previously 1.6 cm). Left iliac node measures up to 1.3 cm in short axis (series 2, image 73). There are additional mildly prominent but not enlarged iliac nodes bilaterally. No pathologically enlarged inguinal lymph nodes identified. Overall, the degree of adenopathy is stable to slightly decreased from prior study.  No acute osseous abnormality. No worrisome lytic or blastic osseous lesions.  Mild diffuse anasarca.  IMPRESSION: 1. Moderate pericardial effusion with small bilateral pleural effusions, right greater than left, and small volume ascites. 2. Stable to slightly decreased size of retroperitoneal and iliac adenopathy as above. These nodes are indeterminate. Continued attention on followup is recommended. Additionally, correlation with PET-CT could be considered for further evaluation. 3. No other acute intra-abdominal pelvic process identified.   Electronically Signed   By: Jeannine Boga M.D.   On: 03/11/2014 23:59   Dg Chest 2 View  03/11/2014    CLINICAL DATA:  Chest pain and shortness of breath.  EXAM: CHEST  2 VIEW  COMPARISON:  Chest radiograph 12/26/2013  FINDINGS: Marked cardiomegaly. Bilateral perihilar and diffuse interstitial pulmonary opacities. No definite pleural effusion or pneumothorax. Mid thoracic spine degenerative change. Prominent small bowel loops, incompletely visualized.  IMPRESSION: Cardiomegaly and mild interstitial pulmonary edema.  Prominent upper abdominal small bowel loops, incompletely visualized. Consider correlation with dedicated abdominal radiography.   Electronically Signed   By: Lovey Newcomer M.D.   On: 03/11/2014 21:15   Dg Abd Portable 1v  03/11/2014   CLINICAL DATA:  One day history of abdominal pain  EXAM: PORTABLE ABDOMEN - 1 VIEW  COMPARISON:  June 07, 2013  FINDINGS: There is moderate stool in the colon. Bowel gas pattern is unremarkable. No obstruction or free air is seen on this supine examination. There are small phleboliths in the pelvis. Liver appears prominent but stable.  IMPRESSION: Bowel gas pattern unremarkable.  Liver prominent but stable.   Electronically Signed   By: Lowella Grip M.D.   On: 03/11/2014 21:45    Other results: EKG: normal sinus rhythm, right axis deviation.  Assessment & Plan by Problem: Active Problems:   DM type 2 with diabetic peripheral neuropathy   Hypotension   Anemia   PAH (pulmonary artery hypertension)   CKD (chronic kidney disease), stage III   Pericardial effusion   #Shortness of breath and chest pain Moderate pericardial effusion on abdominal CT, unclear of clinical significance since CT can overestimate  size of effusion.  May be chronic due to scleroderma with restrictive heart disease.  Does not appear to be volume overloaded currently other than elevated JVP, which may be chronic.  BNP down from prior.  Weight stable from clinic visit on 02/27/14, making CHF exacerbation less likely.  Blood pressure low on presentation and has been low since  hospitalization, suggesting she may be over diuresed to some extent after the increase in her Lasix dose.  History of non-compliance with meds, which could be contributing.  With pleuritic chest pain and leg swelling since fall with some degree of immobility will need to rule out PE.  Wells moderate risk with score of 4.5 due to symptoms and immobilization. -Admit to 3W cardiac step down. -500 ml NS saline bolus. -TTE. -Lower extremity dopplers. -D-dimer. V/Q if elevated, and consider starting heparin. -Cycle troponins. -Consider consulting cardiology in morning given complexity of her pulmonary hypertension and right heart failure.  #Scleroderma with systemic sclerosis and severe secondary pulmonary HTN Follows with Dr. Lamonte Sakai of pulmonology.  Not currently on prednisone, but may restart soon.  Currently on Letaris and Adcirca for pulmonary hypertension.  Adcirca can lead to hypotension. -Continue home Letaris and hold Adcirca given concern for hypotension.  #Mixed congestive heart failure Right sided heart failure with possible restrictive component as well from scleroderma.  Follows with Dr, Harrington Challenger of cardiology. -Hold Lasix in setting of low blood pressure. -Continue home aspirin. -Strict ins and outs, daily weights.  #DM2 with gastroparesis and peripheral neuropathy Not currently on any diabetes medications, A1C 5.3 in 10/15.  On both Lyrica and gabapentin per med rec, however should only be on one. -Continue home Lyrica.  #Stage III CKD Creatinine slightly elevated from baseline of 1.4. -Rehydrate and monitor BMP.  #Anemia Hgb down to 9.1 from 9.5 earlier this month.  Denies any bleeding currently, but anemia could be contributing to symptoms. -Check FOBT. -Continue home ferrous sulfate.  #GERD -Continue home PPI.  #Hypertriglyceridemia -Continue home Tricor.  Dispo: Disposition is deferred at this time, awaiting improvement of current medical problems. Anticipated discharge  in approximately 2-3 day(s).   The patient does have a current PCP Sid Falcon, MD), therefore will be require [facility] follow-up after discharge.   The patient does have transportation limitations that hinder transportation to clinic appointments.   Signed:  Arman Filter, MD, PhD PGY-1 Internal Medicine Teaching Service Pager: [phone] 03/12/2014, 2:52 AM

## 2014-03-12 NOTE — ED Notes (Signed)
Dr. Naaman Plummer called back to discuss plan of care. Patient will have CTA completed with antiallergy medications for over 13 hours. She also discussed that if doppler of lower extremities is completed prior to CTA, and if it is positive doppler finding, patient will not need CTA testing. She also advises to administer lyrica 10am dose for pain management at this time.

## 2014-03-12 NOTE — ED Notes (Signed)
Informed CT that patient is receiving prednisone at this time.

## 2014-03-12 NOTE — Progress Notes (Signed)
[facility] for Heparin  Indication: Concern for PE  Allergies  Allergen Reactions  . Cephalexin Rash  . Ciprofloxacin Rash  . Codeine Other (See Comments)    REACTION: GI upset  . Contrast Media [Iodinated Diagnostic Agents] Hives  . Iohexol Hives     Code: HIVES, Desc: pt breaks out in large hives. needs full premeds, Onset Date: [id number]     Patient Measurements: 74.6 kg  Vital Signs: BP: 125/87 mmHg (12/29 1231) Pulse Rate: 76 (12/29 1231)  Labs:  Recent Labs  03/11/14 1927 03/11/14 2127 03/11/14 2355 03/12/14 0638 03/12/14 1154  HGB  --  9.1*  --  8.5*  --   HCT  --  28.1*  --  26.0*  --   PLT  --  129*  --  126*  --   HEPARINUNFRC  --   --   --   --  0.13*  CREATININE 1.63*  --   --  1.47*  --   TROPONINI  --   --  <0.03 <0.03  --     Estimated Creatinine Clearance: 39.3 mL/min (by C-G formula based on Cr of 1.47).   Medical History: Past Medical History  Diagnosis Date  . Secondary pulmonary hypertension     right heart cath 04/20/04  . Cough   . Allergic rhinitis, cause unspecified   . Esophageal reflux   . Systemic sclerosis   . Unspecified essential hypertension   . Gastritis   . Sickle cell trait     "trace"  . Obesity   . Visual changes   . Scleroderma   . Diastolic dysfunction   . Trichomonas   . Vaginal bleeding   . Neuropathy   . CHF (congestive heart failure)   . Type II diabetes mellitus     DIET CONTROL   . Bursitis   . History of blood transfusion     "think it was related to when I had partial hysteretomy; might have been for one of my knee ORs"  . Degeneration of lumbar or lumbosacral intervertebral disc   . Arthritis     "all over my body" (08/04/2013)  . Chronic kidney disease     "been in hospital for it; don't know what kind" (08/04/2013)   Assessment: 63 y/o F on heparin for r/o PE. Dopplers negative for VTE.  Initial heparin level is 0.13.  Plan for VQ scan today   Goal of  Therapy:  Heparin level 0.3-0.7 units/ml Monitor platelets by anticoagulation protocol: Yes   Plan:  -Heparin bolus 1000 units -Increase heparin drip to1000 units/hr -Check heparin level in 6 hours -Daily CBC/HL -Monitor for bleeding   Damar Petit Poteet 03/12/2014,12:36 PM

## 2014-03-12 NOTE — ED Notes (Signed)
Attempted report 

## 2014-03-12 NOTE — Progress Notes (Signed)
Internal Medicine Night Float Interim Progress Note  S: Went to see patient at request of day team to check on breathing status.  [name] family says she is about the same as yesterday, and patient says she is still having trouble breathing.  She says she is having a headache and continues to have diffuse chest pain.  She says that she hasn't slept since Sunday and feels very tired. She denies any increasing lower extremity edema.  O: Filed Vitals:   03/12/14 1924  BP: 124/65  Pulse: 77  Temp: 97.9 F (36.6 C)  Resp: 20  Satting 95% on 2L.  Physical Exam  Constitutional:  Resting in bed, in no acute distress. Speaking in mostly full sentences.  Pulmonary/Chest: She has no rales. She exhibits no tenderness.  Increased work of breathing.  Moving air better than prior.  End-expiratory wheezing.   Echo: Grade 1 diastolic dysfunction, EF 02-20% 1. Large pericardial effusion. No evidence of hemodynamic compromise. 2. Severe pulmonary hypertension with estimated PA pressure 111 mm Hg.  V/Q Scan: No perfusion defects identified -no evidence of pulmonary emboli.  Lower extremity dopplers: - No evidence of deep vein or superficial thrombosis involving theright lower extremity and left lower extremity. - No evidence of Baker's cyst on the right or left.  A/P: No PE or DVT and pericardial effusion not causing hemodynamic compromise. Cause of SOB still unclear.  Blood pressure much improved after gentle rehydration and holding Lasix today.  No evidence of worsening heart failure and dry on lung exam, so will hold off on Lasix for now.  Has not been able to get ambrisentan because it is not stocked in the pharmacy. -Cancelled CTA given negative V/Q scan. -Stopped heparin.  Will continue at lower dose for DVT prophylaxis. -Continue Norco 10-325 mg q6h PRN for pain. -Ambien for sleep. -Put in for repeat chest x-ray for tomorrow morning to determine if she would benefit from restarting  diuresis. -Consult pulmonology in the morning for their input. -Family will bring ambrisentan tomorrow for patient to take. -Discuss restarting tadalafil with pulmonology tomorrow.   Arman Filter, MD, PhD Internal Medicine Intern Pager: [phone] 03/12/2014,9:14 PM

## 2014-03-12 NOTE — ED Notes (Signed)
Teaching service at the bedside to see the patient. Discussed SBP in 80's. Received 250 of 543m bolus before stopped. MDs acknowledge.

## 2014-03-12 NOTE — ED Notes (Signed)
Called and spoke to CT tech, patient is allergic to the dye and has elevated kidney lab values. He acknowledges, and advises that she can receive vq scan in the morning, or prep for antiallergy over 13 hours and then complete ct.

## 2014-03-12 NOTE — ED Notes (Signed)
Paged admitting provider.

## 2014-03-12 NOTE — Progress Notes (Signed)
Subjective: At bedside, she appeared to be in no distress but struggled to speak with me. She reports of pain that's localized to sternum with radiation to her right side that worsens with palpation and inspiration. She named the doctor who diagnosed her with scleroderma though was unrecognizable to me. She would like to have something that would help her feel comfortable.  Objective: Vital signs in last 24 hours: Filed Vitals:   03/12/14 0700 03/12/14 0815 03/12/14 0830 03/12/14 1012  BP: 113/67 114/70 116/66 127/91  Pulse: 76 76 71 77  Temp:      TempSrc:      Resp:  _0 SpO2: 98% 96% 92% 97%   Weight change:   Intake/Output Summary (Last 24 hours) at 03/12/14 1038 Last data filed at 03/12/14 0343  Gross per 24 hour  Intake    750 ml  Output      0 ml  Net    750 ml   General: resting in bed, trouble finishing words HEENT: PERRL, EOMI, no scleral icterus, oropharynx clear. Distended neck veins visualized on left side. Cardiac: RRR, no rubs, murmurs or gallops Pulm: wheezing present on exam R > L, worse with expiration Abd: soft, nontender, nondistended, BS present Ext: warm and well perfused, no pedal edema, R knee scar from prior surgery, no tenderness to palpation of legs Neuro: responds to questions appropriately; moving all extremities freely   Lab Results: Basic Metabolic Panel:  Recent Labs Lab 03/11/14 1927 03/12/14 0638  NA 142 140  K 4.0 3.8  CL 110 110  CO2 20 25  GLUCOSE 156* 92  BUN 58* 56*  CREATININE 1.63* 1.47*  CALCIUM 9.5 9.5   CBC:  Recent Labs Lab 03/11/14 2127 03/12/14 0638  WBC 5.0 4.7  NEUTROABS 2.9  --   HGB 9.1* 8.5*  HCT 28.1* 26.0*  MCV 82.9 80.2  PLT 129* 126*   Cardiac Enzymes:  Recent Labs Lab 03/11/14 2355 03/12/14 0638  TROPONINI <0.03 <0.03   D-Dimer:  Recent Labs Lab 03/12/14 0230  DDIMER 2.35*   Urine Drug Screen: Drugs of Abuse     Component Value Date/Time   LABOPIA POSITIVE* 10/02/2010  1128   COCAINSCRNUR NONE DETECTED 10/02/2010 1128   LABBENZ NONE DETECTED 10/02/2010 1128   AMPHETMU NONE DETECTED 10/02/2010 1128   THCU NONE DETECTED 10/02/2010 1128   LABBARB NONE DETECTED 10/02/2010 1128    Micro Results: No results found for this or any previous visit (from the past 240 hour(s)).  Studies/Results: Ct Abdomen Pelvis Wo Contrast  03/11/2014   CLINICAL DATA:  Initial evaluation for shortness of breath, abdominal pain.  EXAM: CT ABDOMEN AND PELVIS WITHOUT CONTRAST  TECHNIQUE: Multidetector CT imaging of the abdomen and pelvis was performed following the standard protocol without IV contrast.  COMPARISON:  None.  FINDINGS: Bilateral pleural effusions are partially visualized, right greater than left. There is associated bibasilar atelectasis. Moderate to large pericardial effusion is partially visualized.  Scattered cystic lesions within the right hepatic lobe are stable. Liver is otherwise unremarkable. Gallbladder within normal limits. No biliary dilatation. Spleen, adrenal glands, and pancreas demonstrate a normal unenhanced appearance.  Kidneys are equal in size without evidence of nephrolithiasis or hydronephrosis.  Distal esophagus is somewhat patulous. Stomach within normal limits. No evidence for bowel obstruction. No abnormal wall thickening or inflammatory changes seen about the bowels.  Bladder within normal limits. Uterus and ovaries are not visualized.  Small volume free fluid seen adjacent to the  liver.  No free air.  Retroperitoneal adenopathy again seen, similar to prior. Again, the most prominent node is seen in the left periaortic region and 1.5 cm in short axis (series 2, image 50, previously 1.6 cm). Left iliac node measures up to 1.3 cm in short axis (series 2, image 73). There are additional mildly prominent but not enlarged iliac nodes bilaterally. No pathologically enlarged inguinal lymph nodes identified. Overall, the degree of adenopathy is stable to slightly  decreased from prior study.  No acute osseous abnormality. No worrisome lytic or blastic osseous lesions.  Mild diffuse anasarca.  IMPRESSION: 1. Moderate pericardial effusion with small bilateral pleural effusions, right greater than left, and small volume ascites. 2. Stable to slightly decreased size of retroperitoneal and iliac adenopathy as above. These nodes are indeterminate. Continued attention on followup is recommended. Additionally, correlation with PET-CT could be considered for further evaluation. 3. No other acute intra-abdominal pelvic process identified.   Electronically Signed   By: Jeannine Boga M.D.   On: 03/11/2014 23:59   Dg Chest 2 View  03/11/2014   CLINICAL DATA:  Chest pain and shortness of breath.  EXAM: CHEST  2 VIEW  COMPARISON:  Chest radiograph 12/26/2013  FINDINGS: Marked cardiomegaly. Bilateral perihilar and diffuse interstitial pulmonary opacities. No definite pleural effusion or pneumothorax. Mid thoracic spine degenerative change. Prominent small bowel loops, incompletely visualized.  IMPRESSION: Cardiomegaly and mild interstitial pulmonary edema.  Prominent upper abdominal small bowel loops, incompletely visualized. Consider correlation with dedicated abdominal radiography.   Electronically Signed   By: Lovey Newcomer M.D.   On: 03/11/2014 21:15   Dg Abd Portable 1v  03/11/2014   CLINICAL DATA:  One day history of abdominal pain  EXAM: PORTABLE ABDOMEN - 1 VIEW  COMPARISON:  June 07, 2013  FINDINGS: There is moderate stool in the colon. Bowel gas pattern is unremarkable. No obstruction or free air is seen on this supine examination. There are small phleboliths in the pelvis. Liver appears prominent but stable.  IMPRESSION: Bowel gas pattern unremarkable.  Liver prominent but stable.   Electronically Signed   By: Lowella Grip M.D.   On: 03/11/2014 21:45   Medications: I have reviewed the patient's current medications. Scheduled Meds: . ambrisentan  5 mg Oral  Daily  . aspirin EC  81 mg Oral Daily  . fenofibrate  54 mg Oral Daily  . ferrous sulfate  325 mg Oral Q breakfast  . FLUoxetine  20 mg Oral q morning - 10a  . pantoprazole  40 mg Oral Daily  . pregabalin  150 mg Oral TID  . sodium chloride  3 mL Intravenous Q12H   Continuous Infusions: . 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   Pleuritic chest pain, dyspnea: Moderate risk for PE with Wells score 4.5 due to recent immobilization and clinical signs/symptoms of PE.  O2 sats >90% on room air, LE Dupplex unremarkable is reassuring. Other causes may be acute pericarditis given pericardial effusion; BUN has been trending upwards since June. ACS less likely given troponin negative x 2. -CTA pending pre-medication with prednisone 22m q6h x 3 & Benadryl 59mgiven her contrast allergy -Continue empiric IV heparin -Echo, troponin pending -Cardiology following, appreciate recs  Scleroderma complicated by pulmonary HTN: Last labs in our system on July 2012  are unremarkable for both anti-Scl 70 & anti-centromere Ab. Follows with Dr. Amil Amen who last saw her back in August and has otherwise stable disease; diagnosis made by another provider before he could start working. Follows with Dr. Lamonte Sakai for pulmonary HTN  -Continue home [place] home Adcirca given low BP  Mixed CHF: EF 50% with dilated RV & reduced systolic function. Follows with Dr. Harrington Challenger. 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  -Continue home Lyrica  CKD Stage 3: Crt 1.5, baseline 1.2-1.4.  -Continue assessing.  Anemia: Hb 8.5,  downtrending from 11 in March.  -FOBT pending  Hypertriglyceridemia: Continue home fenofibrate.  Depression: Continue home fluoxetine, Lyrica, Norco.  GERD: Continue Protonix.  #FEN:  -Diet: Heart Healthy  #DVT prophylaxis: heparin per pharmacy  #CODE STATUS: FULL CODE  Dispo: Disposition is deferred at this time, awaiting improvement of current medical problems.   The patient does have a current PCP Pamela Falcon, MD) and does need an [facility] hospital follow-up appointment after discharge.  The patient does not know have transportation limitations that hinder transportation to clinic appointments.  .Services Needed at time of discharge: Y = Yes, Blank = No PT:   OT:   RN:   Equipment:   Other:     LOS: 1 day   Charlott Rakes, MD 03/12/2014, 10:38 AM

## 2014-03-12 NOTE — ED Notes (Signed)
MD paged

## 2014-03-12 NOTE — ED Notes (Signed)
Called for breakfast tray.

## 2014-03-12 NOTE — ED Notes (Signed)
Called Dr. Naaman Plummer to report patient's complaint of pain and request for pain meds. Also clarified that heparin drip is infusing, hgb is 9.1, hemocult ordered. MD acknowledges, no need to collect hemocult at this time, reviewed norco pain med in history, and reviewed bp 101/50s.

## 2014-03-12 NOTE — ED Notes (Signed)
Ordered cardiac healthy tray for lunch.

## 2014-03-12 NOTE — ED Notes (Signed)
Spoke to admitting, discussed options available in regards to CTA and allergy to die. Also reported request for pain medication, reviewed bp improvement, 121/73, p 70's. o2 sat currently 95%, still wearing nasal cannula at 2L nasal cannula. Heparin drip still needs to be started per MD, and she is called radiology to discuss possible vq scan instead.

## 2014-03-12 NOTE — Consult Note (Signed)
CARDIOLOGY CONSULT NOTE   Patient ID: [name] [name]: [id number], DOB/AGE: 03/25/1950   Admit date: 03/11/2014 Date of Consult: 03/12/2014   Primary Physician: Gilles Chiquito, MD Primary Cardiologist: Dr. Dorris Carnes  Pt. Profile  63 year old woman with scleroderma and chronic right heart failure.  She was admitted with chest pain and worsening dyspnea  Problem List  Past Medical History  Diagnosis Date  . Secondary pulmonary hypertension     right heart cath 04/20/04  . Cough   . Allergic rhinitis, cause unspecified   . Esophageal reflux   . Systemic sclerosis   . Unspecified essential hypertension   . Gastritis   . Sickle cell trait     "trace"  . Obesity   . Visual changes   . Scleroderma   . Diastolic dysfunction   . Trichomonas   . Vaginal bleeding   . Neuropathy   . CHF (congestive heart failure)   . Type II diabetes mellitus     DIET CONTROL   . Bursitis   . History of blood transfusion     "think it was related to when I had partial hysteretomy; might have been for one of my knee ORs"  . Degeneration of lumbar or lumbosacral intervertebral disc   . Arthritis     "all over my body" (08/04/2013)  . Chronic kidney disease     "been in hospital for it; don't know what kind" (08/04/2013)    Past Surgical History  Procedure Laterality Date  . Tubal ligation    . Esophagogastroduodenoscopy  02/23/2010    multiple  . Shoulder arthroscopy Right 12/2009    subacromial decompression  . Replacement total knee Left 11/2000  . Cardiac catheterization Right 04/24/2004  . Tonsillectomy  1960's  . Abdominal hysterectomy  1983    "partial"  . Joint replacement    . Shoulder arthroscopy w/ rotator cuff repair Right 01/2010  . Excisional total knee arthroplasty with antibiotic spacers Left 08/2010    "got infected & had to take 1st replacement out"  . Revision total knee arthroplasty Left 11/2010    "removed spacers; replaced knee"  . Total knee revision with scar  debridement/patella revision with poly exchange Left 12/2010    fell; knee split opened; had to redo revision"  . Cardiac catheterization Left 07/2010  . Peripherally inserted central catheter insertion  09/2010  . Colonoscopy  09/06/2008    normal----Dr. Lajoyce Corners      Allergies  Allergies  Allergen Reactions  . Cephalexin Rash  . Ciprofloxacin Rash  . Codeine Other (See Comments)    REACTION: GI upset  . Contrast Media [Iodinated Diagnostic Agents] Hives  . Iohexol Hives     Code: HIVES, Desc: pt breaks out in large hives. needs full premeds, Onset Date: [id number]     HPI   This 63 year old woman has a past history of scleroderma.  She is followed from a cardiac standpoint by Dr. Dorris Carnes.  She was last seen by Dr. Dorris Carnes in the office on 09/13/13.  She has a history of pulmonary hypertension.  Her echocardiogram in March 2015 showed right ventricular ejection fraction that was moderately to severely depressed with an estimated pulmonary artery pressure of 97 mmHg and a left ventricular ejection fraction was 50%. She comes in this time with worsening shortness of breath associated with precordial chest discomfort which started Sunday night and worsened overnight.  She has ruled out for myocardial infarction.  A CT scan of the abdomen  shows a moderate pericardial effusion.  A two-dimensional echocardiogram is pending.  Her d-dimer is elevated and a CT angiogram of the chest is pending.  She has been on a baby aspirin at home for anticoagulation.  The patient has been relatively sedentary at home and there is a concern for possible DVT and pulmonary emboli.  In October she had venous duplex of her lower extremities which were negative for DVT at that time.  On this admission her blood pressures have been low and her home dose of Lasix is currently being held.  Inpatient Medications  . ambrisentan  5 mg Oral Daily  . aspirin EC  81 mg Oral Daily  . fenofibrate  54 mg Oral Daily  . ferrous  sulfate  325 mg Oral Q breakfast  . FLUoxetine  20 mg Oral q morning - 10a  . pantoprazole  40 mg Oral Daily  . pregabalin  150 mg Oral TID  . sodium chloride  3 mL Intravenous Q12H    Family History Family History  Problem Relation Age of Onset  . Heart disease Mother   . Diabetes Mother   . Diabetes Sister      Social History History   Social History  . Marital Status: Legally Separated    Spouse Name: N/A    Number of Children: 2  . Years of Education: 11   Occupational History  . Umemployed   . Disability     Social History Main Topics  . Smoking status: Never Smoker   . Smokeless tobacco: Never Used  . Alcohol Use: No  . Drug Use: No  . Sexual Activity: Not on file   Other Topics Concern  . Not on file   Social History Narrative    FAMILY HISTORY:  Significant for coronary artery disease and diabetes   Patient lives at home with granddaughter.    Patient has 2 children.    Patient has 11 years of education.    Patient is on disability.    Patient is right handed.    Patient is separated.       Review of Systems  General:  No chills, fever, night sweats or weight changes.  Cardiovascular:  No chest pain, dyspnea on exertion, edema, orthopnea, palpitations, paroxysmal nocturnal dyspnea. Dermatological: No rash, lesions/masses Respiratory: No cough, dyspnea Urologic: No hematuria, dysuria Abdominal:   No nausea, vomiting, diarrhea, bright red blood per rectum, melena, or hematemesis Neurologic:  No visual changes, wkns, changes in mental status. All other systems reviewed and are otherwise negative except as noted above.  Physical Exam  Blood pressure 127/91, pulse 77, temperature 98.2 F (36.8 C), temperature source Oral, resp. rate 21, SpO2 97 %.  General: Pleasant, NAD.  Very short of breath at rest. Psych: Normal affect. Neuro: Alert and oriented X 3. Moves all extremities spontaneously. HEENT: Normal  Neck: Supple without bruits.  The jugular  venous pressure is elevated. Lungs:  There are bibasilar inspiratory rales.  There is diffuse expiratory wheezing bilaterally Heart: RRR no s3, s4, or murmurs.  No pericardial rub Abdomen: Soft, non-tender, non-distended, BS + x 4.  Mildly tender in right upper quadrant. Extremities: Trace pretibial edema.  Pedal pulses are weak but present  Labs   Recent Labs  03/11/14 2355 03/12/14 0638  TROPONINI <0.03 <0.03   Lab Results  Component Value Date   WBC 4.7 03/12/2014   HGB 8.5* 03/12/2014   HCT 26.0* 03/12/2014   MCV 80.2 03/12/2014   PLT  126* 03/12/2014     Recent Labs Lab 03/12/14 0638  NA 140  K 3.8  CL 110  CO2 25  BUN 56*  CREATININE 1.47*  CALCIUM 9.5  GLUCOSE 92   Lab Results  Component Value Date   CHOL 98 04/16/2013   HDL 31* 04/16/2013   LDLCALC 48 04/16/2013   TRIG 93 04/16/2013   Lab Results  Component Value Date   DDIMER 2.35* 03/12/2014    Radiology/Studies  Ct Abdomen Pelvis Wo Contrast  03/11/2014   CLINICAL DATA:  Initial evaluation for shortness of breath, abdominal pain.  EXAM: CT ABDOMEN AND PELVIS WITHOUT CONTRAST  TECHNIQUE: Multidetector CT imaging of the abdomen and pelvis was performed following the standard protocol without IV contrast.  COMPARISON:  None.  FINDINGS: Bilateral pleural effusions are partially visualized, right greater than left. There is associated bibasilar atelectasis. Moderate to large pericardial effusion is partially visualized.  Scattered cystic lesions within the right hepatic lobe are stable. Liver is otherwise unremarkable. Gallbladder within normal limits. No biliary dilatation. Spleen, adrenal glands, and pancreas demonstrate a normal unenhanced appearance.  Kidneys are equal in size without evidence of nephrolithiasis or hydronephrosis.  Distal esophagus is somewhat patulous. Stomach within normal limits. No evidence for bowel obstruction. No abnormal wall thickening or inflammatory changes seen about the  bowels.  Bladder within normal limits. Uterus and ovaries are not visualized.  Small volume free fluid seen adjacent to the liver.  No free air.  Retroperitoneal adenopathy again seen, similar to prior. Again, the most prominent node is seen in the left periaortic region and 1.5 cm in short axis (series 2, image 50, previously 1.6 cm). Left iliac node measures up to 1.3 cm in short axis (series 2, image 73). There are additional mildly prominent but not enlarged iliac nodes bilaterally. No pathologically enlarged inguinal lymph nodes identified. Overall, the degree of adenopathy is stable to slightly decreased from prior study.  No acute osseous abnormality. No worrisome lytic or blastic osseous lesions.  Mild diffuse anasarca.  IMPRESSION: 1. Moderate pericardial effusion with small bilateral pleural effusions, right greater than left, and small volume ascites. 2. Stable to slightly decreased size of retroperitoneal and iliac adenopathy as above. These nodes are indeterminate. Continued attention on followup is recommended. Additionally, correlation with PET-CT could be considered for further evaluation. 3. No other acute intra-abdominal pelvic process identified.   Electronically Signed   By: Jeannine Boga M.D.   On: 03/11/2014 23:59   Dg Chest 2 View  03/11/2014   CLINICAL DATA:  Chest pain and shortness of breath.  EXAM: CHEST  2 VIEW  COMPARISON:  Chest radiograph 12/26/2013  FINDINGS: Marked cardiomegaly. Bilateral perihilar and diffuse interstitial pulmonary opacities. No definite pleural effusion or pneumothorax. Mid thoracic spine degenerative change. Prominent small bowel loops, incompletely visualized.  IMPRESSION: Cardiomegaly and mild interstitial pulmonary edema.  Prominent upper abdominal small bowel loops, incompletely visualized. Consider correlation with dedicated abdominal radiography.   Electronically Signed   By: Lovey Newcomer M.D.   On: 03/11/2014 21:15   Dg Knee Complete 4 Views  Left  02/27/2014   CLINICAL DATA:  Trauma yesterday with pain and swelling. History of left knee arthroplasty.  EXAM: LEFT KNEE - COMPLETE 4+ VIEW  COMPARISON:  12/26/2010  FINDINGS: Long-stem total knee arthroplasty. No acute fracture or dislocation. Cannot exclude small suprapatellar joint effusion. No acute hardware complication.  IMPRESSION: No acute osseous abnormality. Total knee arthroplasty. Cannot exclude small suprapatellar joint effusion.   Electronically  Signed   By: Abigail Miyamoto M.D.   On: 02/27/2014 15:18   Dg Abd Portable 1v  03/11/2014   CLINICAL DATA:  One day history of abdominal pain  EXAM: PORTABLE ABDOMEN - 1 VIEW  COMPARISON:  June 07, 2013  FINDINGS: There is moderate stool in the colon. Bowel gas pattern is unremarkable. No obstruction or free air is seen on this supine examination. There are small phleboliths in the pelvis. Liver appears prominent but stable.  IMPRESSION: Bowel gas pattern unremarkable.  Liver prominent but stable.   Electronically Signed   By: Lowella Grip M.D.   On: 03/11/2014 21:45    ECG Personally reviewed.  Normal sinus rhythm.  Right axis deviation.  Pulmonary disease pattern.  Nonspecific ST-T wave changes.  Borderline low voltage.   ASSESSMENT AND PLAN  1.  Acute on chronic right heart failure with pulmonary hypertension. 2.  Scleroderma 3.  Moderate pericardial effusion by abdominal CT scan.  With her low blood pressure we will check a two-dimensional echocardiogram to be sure that there is not any element of pericardial tamponade.  She is not tachycardic. 4.  Anemia 5.  Diabetes mellitus with gastroparesis and peripheral neuropathy 6.  Chronic kidney disease stage III   Recommendation: I agree with plans for echocardiogram and CT angiogram of the chest.  With her low blood pressure and slightly higher creatinine it is reasonable to hold her Lasix at this point.  Likewise reasonable to hold Adcirca in the face of her hypotension.   Consider pulmonary consultation to help with wheezing.  It is difficult to tell how much left heart failure may be contributing to her rales and wheezing at this point. Will follow with you  Signed, Darlin Coco, MD  03/12/2014, 10:22 AM

## 2014-03-12 NOTE — ED Notes (Signed)
Sister, Ashok Cordia [phone] if any concerns.

## 2014-03-12 NOTE — Progress Notes (Signed)
VASCULAR LAB PRELIMINARY  PRELIMINARY  PRELIMINARY  PRELIMINARY  BLEV completed.    Preliminary report: Negative DVT bilat  Lindwood Coke, RVT 03/12/2014, 9:31 AM

## 2014-03-12 NOTE — Progress Notes (Signed)
  Echocardiogram 2D Echocardiogram has been performed.  Diamond Nickel 03/12/2014, 4:08 PM

## 2014-03-12 NOTE — ED Notes (Signed)
Cardiology at the bedside.

## 2014-03-13 ENCOUNTER — Inpatient Hospital Stay (HOSPITAL_COMMUNITY): Payer: Medicare Other

## 2014-03-13 ENCOUNTER — Ambulatory Visit: Payer: Medicare Other | Admitting: Emergency Medicine

## 2014-03-13 DIAGNOSIS — R0689 Other abnormalities of breathing: Secondary | ICD-10-CM

## 2014-03-13 DIAGNOSIS — R188 Other ascites: Secondary | ICD-10-CM

## 2014-03-13 DIAGNOSIS — R0789 Other chest pain: Secondary | ICD-10-CM

## 2014-03-13 DIAGNOSIS — R0600 Dyspnea, unspecified: Secondary | ICD-10-CM

## 2014-03-13 DIAGNOSIS — F329 Major depressive disorder, single episode, unspecified: Secondary | ICD-10-CM

## 2014-03-13 DIAGNOSIS — I5033 Acute on chronic diastolic (congestive) heart failure: Secondary | ICD-10-CM

## 2014-03-13 DIAGNOSIS — E781 Pure hyperglyceridemia: Secondary | ICD-10-CM

## 2014-03-13 DIAGNOSIS — M349 Systemic sclerosis, unspecified: Principal | ICD-10-CM

## 2014-03-13 DIAGNOSIS — I319 Disease of pericardium, unspecified: Secondary | ICD-10-CM

## 2014-03-13 DIAGNOSIS — I509 Heart failure, unspecified: Secondary | ICD-10-CM

## 2014-03-13 DIAGNOSIS — K219 Gastro-esophageal reflux disease without esophagitis: Secondary | ICD-10-CM

## 2014-03-13 DIAGNOSIS — I5023 Acute on chronic systolic (congestive) heart failure: Secondary | ICD-10-CM

## 2014-03-13 DIAGNOSIS — R0602 Shortness of breath: Secondary | ICD-10-CM

## 2014-03-13 LAB — CBC
HCT: 27.1 % — ABNORMAL LOW (ref 36.0–46.0)
HEMOGLOBIN: 8.7 g/dL — AB (ref 12.0–15.0)
MCH: 26.7 pg (ref 26.0–34.0)
MCHC: 32.1 g/dL (ref 30.0–36.0)
MCV: 83.1 fL (ref 78.0–100.0)
Platelets: 125 10*3/uL — ABNORMAL LOW (ref 150–400)
RBC: 3.26 MIL/uL — ABNORMAL LOW (ref 3.87–5.11)
RDW: 15 % (ref 11.5–15.5)
WBC: 4.4 10*3/uL (ref 4.0–10.5)

## 2014-03-13 LAB — BASIC METABOLIC PANEL
Anion gap: 10 (ref 5–15)
BUN: 55 mg/dL — ABNORMAL HIGH (ref 6–23)
CALCIUM: 9.5 mg/dL (ref 8.4–10.5)
CO2: 23 mmol/L (ref 19–32)
Chloride: 108 mEq/L (ref 96–112)
Creatinine, Ser: 1.5 mg/dL — ABNORMAL HIGH (ref 0.50–1.10)
GFR calc Af Amer: 42 mL/min — ABNORMAL LOW (ref >90)
GFR, EST NON AFRICAN AMERICAN: 36 mL/min — AB (ref >90)
GLUCOSE: 126 mg/dL — AB (ref 70–99)
Potassium: 4.4 mmol/L (ref 3.5–5.1)
SODIUM: 141 mmol/L (ref 135–145)

## 2014-03-13 LAB — BLOOD GAS, ARTERIAL
ACID-BASE DEFICIT: 0.9 mmol/L (ref 0.0–2.0)
Bicarbonate: 23.2 mEq/L (ref 20.0–24.0)
Drawn by: 295031
O2 Content: 2 L/min
O2 Saturation: 98.2 %
PO2 ART: 98.3 mmHg (ref 80.0–100.0)
Patient temperature: 98.6
TCO2: 24.3 mmol/L (ref 0–100)
pCO2 arterial: 37.6 mmHg (ref 35.0–45.0)
pH, Arterial: 7.407 (ref 7.350–7.450)

## 2014-03-13 LAB — LACTIC ACID, PLASMA: LACTIC ACID, VENOUS: 1.3 mmol/L (ref 0.5–2.2)

## 2014-03-13 LAB — OCCULT BLOOD X 1 CARD TO LAB, STOOL: Fecal Occult Bld: POSITIVE — AB

## 2014-03-13 MED ORDER — AMBRISENTAN 5 MG PO TABS
5.0000 mg | ORAL_TABLET | Freq: Every day | ORAL | Status: DC
  Administered 2014-03-14 – 2014-03-16 (×3): 5 mg via ORAL

## 2014-03-13 MED ORDER — SILDENAFIL CITRATE 20 MG PO TABS
20.0000 mg | ORAL_TABLET | Freq: Three times a day (TID) | ORAL | Status: DC
  Administered 2014-03-13 – 2014-03-25 (×32): 20 mg via ORAL
  Filled 2014-03-13 (×40): qty 1

## 2014-03-13 NOTE — Consult Note (Addendum)
PULMONARY / CRITICAL CARE MEDICINE   Name: [name] MRN: [id number] DOB: [birth year]    ADMISSION DATE:  03/11/2014 CONSULTATION DATE:  03/13/2014   REFERRING MD :  Graciella Freer and cards  CHIEF COMPLAINT:  Class 4 dyspnea wih new pericardial effusion in patient with [place] due to scleroderma  SIGNIFICANT EVENTS: 03/11/2014 - admit 03/13/2014 - PCCM consult     HISTORY OF PRESENT ILLNESS:   - hx from talking to dR Croit[place], Dr Lamonte Sakai, Dr Posey Pronto  [name] is a 63 year old woman with scleroderma and severe secondary pulmonary hypertension. Initiated [name], Adcirca in the past but unable to tolerate. Finally started on inhaled Tyvaso in 2010. Some delay in titrating up to goal dosing of 9 puffs 4x a day due to compliance issues, but finally able to do so. This was then stopped on her OWN  in Summer 2012 during admission for septic knee hardware due to convenience issues.  Subsequently Rx with with bosentan + Coumadin even through 2013 when she had rising PASP and despite advise resisted Tyvaso. In mid-2014 due to compliance with labs came off both bosentan and coumadin and by this time dyspnea was class 3. Then throuh Fall 2014 and Spring 2015 Rx with macitentan but stopped due to diarrhea. Following this in March 2015 admitted for decompensated corpulmonale and then started on ADCIRCA at this time. By Fall (oct)  2015 LETAIRIS was added.   It appears that progressively by 2014/2015 reluctance, compliance and confusion about meds got progressively worse (all well documenteD) . Also, reading notes and d/w Dr Lamonte Sakai that ECHO has showed progressive worsening pulmonary hypertension (TTE) since 2010 and associated with worsening dyspnea (see below) and compliance issues have resulted in admission. Clinically it appears on chart that she would respond to these meds and when off meds she would get worse  Currently admitted 03/11/2014 with few to several days of worsening dyspnea from class 3 to 4, and  worsening orthopnea (baseline 3 pillow to needing to sit in bed). Unsure if she has edema worsening; she says no. Insists she was compliant with Letairis and "brown pill" ? Adcirca.   Eval since admit has shown bilateral small pleural effusions, new moderate to large pericardial effusion with worsening PASP. Cards unsure if of tamponade due to [facility] physiology on echo. PCCM asked to helpw with dyspnea, PAH mgmt. PE has been ruled by normal VQ (d-dimer high)   In hospital BP running soft  Serial Echo findings - PASP 2010   - 42  - PASP 2013 - 63 - PASP June 2013 - 85 with moderate RV dilatation - PASP on TTE 01/24/13 > stable at 53mHg - RV dilated severely - PASP 06/08/13 - 987mG - small pericardial effusion - RV severely dilated, moderate reduction in EF - PASP 03/12/14 - 11342m  - large free flowing effusion (VQ scan no PE) - RV moderately dilated    PAST MEDICAL HISTORY :   has a past medical history of Secondary pulmonary hypertension; Cough; Allergic rhinitis, cause unspecified; Esophageal reflux; Systemic sclerosis; Unspecified essential hypertension; Gastritis; Sickle cell trait; Obesity; Visual changes; Scleroderma; Diastolic dysfunction; Trichomonas; Vaginal bleeding; Neuropathy; CHF (congestive heart failure); Type II diabetes mellitus; Bursitis; History of blood transfusion; Degeneration of lumbar or lumbosacral intervertebral disc; Arthritis; and Chronic kidney disease.  has past surgical history that includes Tubal ligation; Esophagogastroduodenoscopy (02/23/2010); Shoulder arthroscopy (Right, 12/2009); Replacement total knee (Left, 11/2000); Cardiac catheterization (Right, 04/24/2004); Tonsillectomy (1960's); Abdominal hysterectomy (1983); Joint replacement; Shoulder arthroscopy w/ rotator  cuff repair (Right, 01/2010); Excisional total knee arthroplasty with antibiotic spacers (Left, 08/2010); Revision total knee arthroplasty (Left, 11/2010); Total knee revision with scar  debridement/patella revision with poly exchange (Left, 12/2010); Cardiac catheterization (Left, 07/2010); Peripherally inserted central catheter insertion (09/2010); and Colonoscopy (09/06/2008). Prior to Admission medications   Medication Sig Start Date End Date Taking? Authorizing Provider  ambrisentan (LETAIRIS) 5 MG tablet Take 5 mg by mouth daily.   Yes Historical Provider, MD  aspirin EC 81 MG EC tablet Take 1 tablet (81 mg total) by mouth daily. 06/14/13  Yes Rande Brunt, NP  esomeprazole (NEXIUM) 40 MG capsule Take by mouth 2 (two) times daily.     Yes Historical Provider, MD  fenofibrate (TRICOR) 48 MG tablet Take 48 mg by mouth daily.   Yes Historical Provider, MD  ferrous sulfate ([employer] IRON) 325 (65 FE) MG tablet Take 325 mg by mouth daily with breakfast.   Yes Historical Provider, MD  LUoxetine (PROZAC) 20 MG capsule take 1 capsule by mouth every morning 01/14/14  Yes Sid Falcon, MD  furosemide (LASIX) 40 MG tablet Take 1 tablet (40 mg total) by mouth 2 (two) times daily. 01/17/14  Yes Rande Brunt, NP  gabapentin (NEURONTIN) 100 MG capsule Take 1 capsule (100 mg total) by mouth 2 (two) times daily. 08/17/13  Yes Dennie Bible, NP  HYDROcodone-acetaminophen [facility]) 10-325 MG per tablet Take 1 tablet by mouth every 6 (six) hours as needed for moderate pain.  07/17/12  Yes Historical Provider, MD  pregabalin (LYRICA) 150 MG capsule Take 1 capsule (150 mg total) by mouth 3 (three) times daily. 08/17/13  Yes Dennie Bible, NP  promethazine (PHENERGAN) 12.5 MG tablet Take 1 tablet (12.5 mg total) by mouth every 6 (six) hours as needed for nausea or vomiting. 02/27/14  Yes Sid Falcon, MD  Tadalafil, PAH, (ADCIRCA) 20 MG TABS Take 40 mg by mouth daily.   Yes Historical Provider, MD   Allergies  Allergen Reactions  . Cephalexin Rash  . Ciprofloxacin Rash  . Codeine Other (See Comments)    REACTION: GI upset  . Contrast Media [Iodinated Diagnostic Agents] Hives  . Iohexol Hives      Code: HIVES, Desc: pt breaks out in large hives. needs full premeds, Onset Date: [id number]     FAMILY HISTORY:  indicated that her mother is alive. She indicated that her father is deceased.  SOCIAL HISTORY:  reports that she has never smoked. She has never used smokeless tobacco. She reports that she does not drink alcohol or use illicit drugs.  REVIEW OF SYSTEMS:  +ve dyspnea, fatigue, chest pressure, decreased ADL, Neativ ffever, chills. Otherwise 11 point ROS neative. Rest in HPI  SUBJECTIVE:   VITAL SIGNS: Temp:  [97.6 F (36.4 C)-98.3 F (36.8 C)] 97.6 F (36.4 C) (12/30 0435) Pulse Rate:  [54-77] 54 (12/30 0435) Resp:  [18-20] 18 (12/30 0435) BP: (104-142)/(55-72) 104/55 mmHg (12/30 0435) SpO2:  [94 %-98 %] 98 % (12/30 0435) Weight:  [76.068 kg (167 lb 11.2 oz)-76.25 kg (168 lb 1.6 oz)] 76.25 kg (168 lb 1.6 oz) (12/30 0435) HEMODYNAMICS:   VENTILATOR SETTINGS:   INTAKE / OUTPUT:  Intake/Output Summary (Last 24 hours) at 03/13/14 1452 Last data filed at 03/13/14 1356  Gross per 24 hour  Intake    480 ml  Output    500 ml  Net    -20 ml    PHYSICAL EXAMINATION: General:  Frail looking female. Sitting in bed.  No significant distress Neuro:  AXOX3. CAM-ICU negative for delirium. Moves all 4s.  Psych: poor historian, flat affect HEENT:  Supple NEck, Elevated JVP + Cardiovascular:  Loud P2+, RRR + Lungs:  CTA bilaterally but overall diminished Abdomen:  Distended,. RUQ Tender - c/w Passive HEpatic Congestion Musculoskeletal:  Surprisingly minimal edema Skin:  Scleroderma +  LABS:  CBC  Recent Labs Lab 03/11/14 2127 03/12/14 0638 03/13/14 0326  WBC 5.0 4.7 4.4  HGB 9.1* 8.5* 8.7*  HCT 28.1* 26.0* 27.1*  PLT 129* 126* 125*   Coag's No results for input(s): APTT, INR in the last 168 hours. BMET  Recent Labs Lab 03/11/14 1927 03/12/14 0638 03/13/14 0326  NA 142 140 141  K 4.0 3.8 4.4  CL 110 110 108  CO2 _0 BUN 58* 56* 55*   CREATININE 1.63* 1.47* 1.50*  GLUCOSE 156* 92 126*   Electrolytes  Recent Labs Lab 03/11/14 1927 03/12/14 0638 03/13/14 0326  CALCIUM 9.5 9.5 9.5   Sepsis Markers No results for input(s): LATICACIDVEN, PROCALCITON, O2SATVEN in the last 168 hours. ABG No results for input(s): PHART, PCO2ART, PO2ART in the last 168 hours. Liver Enzymes No results for input(s): AST, ALT, ALKPHOS, BILITOT, ALBUMIN in the last 168 hours. Cardiac Enzymes  Recent Labs Lab 03/11/14 2355 03/12/14 0638  TROPONINI <0.03 <0.03   Glucose No results for input(s): GLUCAP in the last 168 hours.  Imaging Nm Pulmonary Perf And Vent  03/12/2014   CLINICAL DATA:  63 year old female with acute shortness of breath and chest pain. Initial encounter.  EXAM: NUCLEAR MEDICINE PERFUSION LUNG SCAN  TECHNIQUE: Perfusion images were obtained in multiple projections after intravenous injection of radiopharmaceutical.  The patient was un able to perform ventilation.  After only two image acquisitions, the patient did not want to continue with the perfusion study.  RADIOPHARMACEUTICALS:  6 mCi Tc-12mMAA  COMPARISON:  03/11/2014 abdominal CT and chest radiograph.  FINDINGS: No perfusion defects are identified.  Cardiomegaly and pericardial effusion as seen on CT attenuates left lower lung activity.  IMPRESSION: No perfusion defects identified -no evidence of pulmonary emboli.   Electronically Signed   By: JHassan RowanM.D.   On: 03/12/2014 19:04     ASSESSMENT / PLAN:  PULMONARY  A:  Severe Pulmonary Hypertension due to Scleroderma - strong history of poor compliance / intolerance Hypotension in hospital with large new pericardial effusion Class 4 dyspnea  DDx   -  acute on chronic decompensation due to poor compliance  - serial worsening of PAH - seems more likely that this is the situation based on \  - Pericardial effusion contriubting to dyspnea   - cardiac tamponade from independent etiology - doubt (no evidence  of tamponade on echo but tehcnically tough)   - pericardial effusion to worsening PAH (likely)   P:   Right heart cath by cards - d/w Dr COrene Desanctis- will be technically difficult to get accurate numbers in setting of peri effusion Restart Adcirca analogue - Revatio  (PDE inhibitor) with shorter half life Continue letairis Check lactate, bnp, abg, lft - based on results consider lasix 03/14/14   ? Consider IV flolan - for class 4 and severe PAH - [facility] d/w Dr BLamonte Sakaiher local PWilliamsspecialist - concern is compliance but ? Maybe last resort, added home support can paradoxcially make her more compliant  Tough situation - her prognosis is very poor. WE are stuck between a rock  and hard place. She has compliance issues. She has progressive PAH. She wants to continue to get Rx. Tough to have it all.     Dr. Brand Males, M.D., [facility].C.P Pulmonary and Critical Care Medicine Staff Physician [facility] Pager: [phone], If no answer or between  15:00h - 7:00h: call [phone]  03/13/2014 3:27 PM

## 2014-03-13 NOTE — Progress Notes (Signed)
Nutrition Brief Note  Patient identified on the Malnutrition Screening Tool (MST) Report  Wt Readings from Last 15 Encounters:  03/13/14 168 lb 1.6 oz (76.25 kg)  02/27/14 164 lb 6.4 oz (74.571 kg)  01/25/14 169 lb 4.8 oz (76.794 kg)  01/17/14 160 lb 8 oz (72.802 kg)  01/11/14 164 lb (74.39 kg)  01/09/14 162 lb 12.8 oz (73.846 kg)  12/30/13 156 lb 4.9 oz (70.9 kg)  12/26/13 178 lb 11.2 oz (81.058 kg)  12/07/13 168 lb 3.2 oz (76.295 kg)  11/28/13 171 lb 12.8 oz (77.928 kg)  11/08/13 171 lb 12.8 oz (77.928 kg)  09/18/13 167 lb (75.751 kg)  09/13/13 172 lb 12.8 oz (78.382 kg)  09/12/13 171 lb 11.2 oz (77.883 kg)  08/24/13 167 lb (75.751 kg)    Body mass index is 28.84 kg/(m^2). Patient meets criteria for overweight based on current BMI.   Current diet order is Heart Healthy, patient is consuming approximately 100% of meals at this time. Labs and medications reviewed.   Patient reports some appetite change but continues to eat well.  Denies weight change.  Patient does not like Heart healthy diet and requests liberalization to regular diet.  Paged MD on patient's behalf. No nutrition interventions warranted at this time. If nutrition issues arise, please consult RD.   Brynda Greathouse, MS RD LDN Clinical Inpatient Dietitian Weekend/After hours pager: [phone]

## 2014-03-13 NOTE — Progress Notes (Signed)
Subjective: Yesterday, no perfusion defects seen on V/Q scan. Echo with EF 60-65%, pulmonary artery pressure 111 mm Hg, large pericardial effusion. LE Dopplers unremarkable. Overnight, she appeared symptomatically better.   This AM, her sister is at bedside. She appears more relaxed and comfortable and still reports mid-sternal pain though improved. She does report to me that she has had blood in her stool recently though cannot provide further details.  Objective: Vital signs in last 24 hours: Filed Vitals:   03/12/14 1520 03/12/14 1924 03/12/14 1948 03/13/14 0435  BP: 142/72 124/65  104/55  Pulse: 74 77  54  Temp: 98.3 F (36.8 C) 97.9 F (36.6 C)  97.6 F (36.4 C)  TempSrc: Oral Oral  Oral  Resp: _0 Height: _1  (1.626 m)     Weight: 167 lb 11.2 oz (76.068 kg)   168 lb 1.6 oz (76.25 kg)  SpO2: 94% 98% 95% 98%   Weight change:  No intake or output data in the 24 hours ending 03/13/14 0759 General: resting in bed, 2L O2 by [place] HEENT: PERRL, EOMI, no scleral icterus, oropharynx clear Cardiac: RRR, no rubs, murmurs or gallops Pulm: clear to auscultation bilaterally without crackles or wheezing Abd: soft, nontender, nondistended, BS present Ext: warm and well perfused, no pedal edema, R knee scar from prior surgery, no tenderness to palpation of legs Neuro: responds to questions appropriately; moving all extremities freely   Lab Results: Basic Metabolic Panel:  Recent Labs Lab 03/12/14 0638 03/13/14 0326  NA 140 141  K 3.8 4.4  CL 110 108  CO2 25 23  GLUCOSE 92 126*  BUN 56* 55*  CREATININE 1.47* 1.50*  CALCIUM 9.5 9.5   CBC:  Recent Labs Lab 03/11/14 2127 03/12/14 0638 03/13/14 0326  WBC 5.0 4.7 4.4  NEUTROABS 2.9  --   --   HGB 9.1* 8.5* 8.7*  HCT 28.1* 26.0* 27.1*  MCV 82.9 80.2 83.1  PLT 129* 126* 125*   Cardiac Enzymes:  Recent Labs Lab 03/11/14 2355 03/12/14 0638  TROPONINI <0.03 <0.03   D-Dimer:  Recent Labs Lab 03/12/14 0230    DDIMER 2.35*   Urine Drug Screen: Drugs of Abuse     Component Value Date/Time   LABOPIA POSITIVE* 10/02/2010 1128   COCAINSCRNUR NONE DETECTED 10/02/2010 1128   LABBENZ NONE DETECTED 10/02/2010 1128   AMPHETMU NONE DETECTED 10/02/2010 1128   THCU NONE DETECTED 10/02/2010 1128   LABBARB NONE DETECTED 10/02/2010 1128    Micro Results: No results found for this or any previous visit (from the past 240 hour(s)).  Studies/Results: Ct Abdomen Pelvis Wo Contrast  03/11/2014   CLINICAL DATA:  Initial evaluation for shortness of breath, abdominal pain.  EXAM: CT ABDOMEN AND PELVIS WITHOUT CONTRAST  TECHNIQUE: Multidetector CT imaging of the abdomen and pelvis was performed following the standard protocol without IV contrast.  COMPARISON:  None.  FINDINGS: Bilateral pleural effusions are partially visualized, right greater than left. There is associated bibasilar atelectasis. Moderate to large pericardial effusion is partially visualized.  Scattered cystic lesions within the right hepatic lobe are stable. Liver is otherwise unremarkable. Gallbladder within normal limits. No biliary dilatation. Spleen, adrenal glands, and pancreas demonstrate a normal unenhanced appearance.  Kidneys are equal in size without evidence of nephrolithiasis or hydronephrosis.  Distal esophagus is somewhat patulous. Stomach within normal limits. No evidence for bowel obstruction. No abnormal wall thickening or inflammatory changes seen about the bowels.  Bladder within normal limits. Uterus and  ovaries are not visualized.  Small volume free fluid seen adjacent to the liver.  No free air.  Retroperitoneal adenopathy again seen, similar to prior. Again, the most prominent node is seen in the left periaortic region and 1.5 cm in short axis (series 2, image 50, previously 1.6 cm). Left iliac node measures up to 1.3 cm in short axis (series 2, image 73). There are additional mildly prominent but not enlarged iliac nodes bilaterally.  No pathologically enlarged inguinal lymph nodes identified. Overall, the degree of adenopathy is stable to slightly decreased from prior study.  No acute osseous abnormality. No worrisome lytic or blastic osseous lesions.  Mild diffuse anasarca.  IMPRESSION: 1. Moderate pericardial effusion with small bilateral pleural effusions, right greater than left, and small volume ascites. 2. Stable to slightly decreased size of retroperitoneal and iliac adenopathy as above. These nodes are indeterminate. Continued attention on followup is recommended. Additionally, correlation with PET-CT could be considered for further evaluation. 3. No other acute intra-abdominal pelvic process identified.   Electronically Signed   By: Jeannine Boga M.D.   On: 03/11/2014 23:59   Dg Chest 2 View  03/13/2014   CLINICAL DATA:  Shortness of breath.  EXAM: CHEST  2 VIEW  COMPARISON:  CT 02/15/2012.  Chest x-ray 12/26/2013.  FINDINGS: Mediastinum hilar structures are unremarkable. Persistent severe cardiomegaly. Pulmonary venous congestion. Diffuse from interstitial prominence and mild pleural effusions previous present consistent with congestive heart failure. A component of the severe cardiomegaly may be related to previously identified pericardial effusion. No pneumothorax. No acute osseus abnormality.  IMPRESSION: 1. Severe cardiomegaly with congestive heart failure. Bilateral interstitial edema and small pleural effusions are present. 2. A component of the cardiomegaly may be related to previously identified pericardial effusion.   Electronically Signed   By: [place]   On: 03/13/2014 07:57   Dg Chest 2 View  03/11/2014   CLINICAL DATA:  Chest pain and shortness of breath.  EXAM: CHEST  2 VIEW  COMPARISON:  Chest radiograph 12/26/2013  FINDINGS: Marked cardiomegaly. Bilateral perihilar and diffuse interstitial pulmonary opacities. No definite pleural effusion or pneumothorax. Mid thoracic spine degenerative change.  Prominent small bowel loops, incompletely visualized.  IMPRESSION: Cardiomegaly and mild interstitial pulmonary edema.  Prominent upper abdominal small bowel loops, incompletely visualized. Consider correlation with dedicated abdominal radiography.   Electronically Signed   By: Lovey Newcomer M.D.   On: 03/11/2014 21:15   Nm Pulmonary Perf And Vent  03/12/2014   CLINICAL DATA:  63 year old female with acute shortness of breath and chest pain. Initial encounter.  EXAM: NUCLEAR MEDICINE PERFUSION LUNG SCAN  TECHNIQUE: Perfusion images were obtained in multiple projections after intravenous injection of radiopharmaceutical.  The patient was un able to perform ventilation.  After only two image acquisitions, the patient did not want to continue with the perfusion study.  RADIOPHARMACEUTICALS:  6 mCi Tc-64mMAA  COMPARISON:  03/11/2014 abdominal CT and chest radiograph.  FINDINGS: No perfusion defects are identified.  Cardiomegaly and pericardial effusion as seen on CT attenuates left lower lung activity.  IMPRESSION: No perfusion defects identified -no evidence of pulmonary emboli.   Electronically Signed   By: JHassan RowanM.D.   On: 03/12/2014 19:04   Dg Abd Portable 1v  03/11/2014   CLINICAL DATA:  One day history of abdominal pain  EXAM: PORTABLE ABDOMEN - 1 VIEW  COMPARISON:  June 07, 2013  FINDINGS: There is moderate stool in the colon. Bowel gas pattern is unremarkable. No obstruction  or free air is seen on this supine examination. There are small phleboliths in the pelvis. Liver appears prominent but stable.  IMPRESSION: Bowel gas pattern unremarkable.  Liver prominent but stable.   Electronically Signed   By: Lowella Grip M.D.   On: 03/11/2014 21:45   Medications: I have reviewed the patient's current medications. Scheduled Meds: . ambrisentan  5 mg Oral Daily  . aspirin EC  81 mg Oral Daily  . fenofibrate  54 mg Oral Daily  . ferrous sulfate  325 mg Oral Q breakfast  . FLUoxetine  20 mg Oral q  morning - 10a  . heparin subcutaneous  5,000 Units Subcutaneous 3 times per day  . pantoprazole  40 mg Oral Daily  . pregabalin  150 mg Oral TID  . sodium chloride  3 mL Intravenous Q12H   Continuous Infusions:   PRN Meds:.HYDROcodone-acetaminophen Assessment/Plan:  Ms. Blaschke is a 63 year old female with scleroderma complicated by pulmonary HTN, mixed CHF, DMII, CKD Stage 3, anemia, depression, GERD, hypertriglyceridemia hospitalized for pleuritic chest pain and dyspnea.   Pleuritic chest pain, dyspnea: Unlikely tamponade, ACS, PE at this point. CXR findings suggestive of pulmonary edema which may represent progression of her CHF. She has not received her Letairis since admission as it's not in the hospital formulary. -Consider restarting home Lasix though BP trending 100-130/60-70 -Consult pulmonology today for her pulmonary HTN -Continue empiric IV heparin -Cardiology following, appreciate recs  Scleroderma complicated by pulmonary HTN: Last labs in our system on July 2012 are unremarkable for both anti-Scl 70 & anti-centromere Ab. Follows with Dr. Amil Amen who last saw her back in August and has otherwise stable disease; diagnosis made by another provider before he started working. Follows with Dr. Lamonte Sakai for pulmonary HTN  -Consult pulmonology as noted above -Continue home Norco  Mixed CHF: EF 60-65% with grade 1 diastolic dysfunction. Pulmonary artery pressure 111, up from 97 back in March.  dilated RV & reduced systolic function. Follows with Dr. Harrington Challenger. R heart failure likely 2/2 pulmonary HTN -Continue home ASA   DM2 complicated by gastroparesis & peripheral neuropathy: A1c 5.3 in October.  -Continue home Lyrica  CKD Stage 3: Crt 1.5, stable from yesterday with baseline 1.2-1.4.  -Continue assessing.  Anemia: Hb 8.7, stable from yesterday but downtrending from 11 in March.  -Collect FOBT today  Hypertriglyceridemia: Continue home fenofibrate.  Depression: Continue home  fluoxetine, Lyrica, Norco.  GERD: Continue Protonix.  #FEN:  -Diet: Heart Healthy  #DVT prophylaxis: heparin per pharmacy  #CODE STATUS: FULL CODE  Dispo: Disposition is deferred at this time, awaiting improvement of current medical problems.   The patient does have a current PCP Sid Falcon, MD) and does need an [facility] hospital follow-up appointment after discharge.  The patient does not know have transportation limitations that hinder transportation to clinic appointments.  .Services Needed at time of discharge: Y = Yes, Blank = No PT:   OT:   RN:   Equipment:   Other:     LOS: 2 days   Charlott Rakes, MD 03/13/2014, 7:59 AM

## 2014-03-13 NOTE — Progress Notes (Signed)
Patient placed on bedside wall monitor for closer observation of mild dyspnea associated with RUQ abdominal tenderness, abdominal distention and general discomfort.  Patient was admitted for SOB and left to right pleuritic chest pain radiating to back in the setting of negative troponin I values and negative acute EKG changes (pulmonary disease pattern with right axis deviation).  Patient has known right-sided heart failure and pulmonary hypertension with recently identified small cardiac and pleural effusions and abdominal ascites.  Vital signs WDL for this patient. Filed Vitals:   03/13/14 2027  BP: 111/67  Pulse: 77  Temp: 98.3 F (36.8 C)  Resp: 18   Signs of mild, worsening CHF with JVD, prominent systolic murmur, mild dyspnea, nasal flaring and inability to talk in complete sentences without pausing for breathing. Respiratory rate and SPO2 are unaffected with SPO2 98% on O2 at 2L/min via nasal canula.  Rapid response RN, Hilarie Fredrickson already on floor, consulted and reviewed findings, case history and medical plan.  Nevin Bloodgood also confirmed findings on brief patient assessment.  Will keep NPO after midnight for any possible procedures tomorrow and notify MD for any worsening of symptoms.  Continuing to closely monitor.

## 2014-03-13 NOTE — Progress Notes (Signed)
Patient Name: [name] Date of Encounter: 03/13/2014  Principal Problem:   DYSPNEA Active Problems:   DM type 2 with diabetic peripheral neuropathy   Hypertriglyceridemia   GERD   SCLERODERMA   Chronic systolic right heart failure   Hypotension   Anemia   PAH (pulmonary artery hypertension)   CKD (chronic kidney disease), stage III   Pericardial effusion   Pleural effusion, bilateral   Ascites   Elevated d-dimer   Pain in the chest   Length of Stay: 2  SUBJECTIVE  Very dyspneic at rest, but she may be slightly better than yesterday. No chest pain.  CURRENT MEDS . ambrisentan  5 mg Oral Daily  . aspirin EC  81 mg Oral Daily  . fenofibrate  54 mg Oral Daily  . ferrous sulfate  325 mg Oral Q breakfast  . FLUoxetine  20 mg Oral q morning - 10a  . heparin subcutaneous  5,000 Units Subcutaneous 3 times per day  . pantoprazole  40 mg Oral Daily  . pregabalin  150 mg Oral TID  . sodium chloride  3 mL Intravenous Q12H    OBJECTIVE   Intake/Output Summary (Last 24 hours) at 03/13/14 0811 Last data filed at 03/13/14 1599  Gross per 24 hour  Intake    360 ml  Output      0 ml  Net    360 ml   Filed Weights   03/12/14 1520 03/13/14 0435  Weight: 167 lb 11.2 oz (76.068 kg) 168 lb 1.6 oz (76.25 kg)    PHYSICAL EXAM Filed Vitals:   03/12/14 1520 03/12/14 1924 03/12/14 1948 03/13/14 0435  BP: 142/72 124/65  104/55  Pulse: 74 77  54  Temp: 98.3 F (36.8 C) 97.9 F (36.6 C)  97.6 F (36.4 C)  TempSrc: Oral Oral  Oral  Resp: _0 Height: 5' 4" (1.626 m)     Weight: 167 lb 11.2 oz (76.068 kg)   168 lb 1.6 oz (76.25 kg)  SpO2: 94% 98% 95% 98%   General: Alert, oriented x3, no distress Head: no evidence of trauma, PERRL, EOMI, no exophtalmos or lid lag, no myxedema, no xanthelasma; normal ears, nose and oropharynx Neck: normal jugular venous pulsations and no hepatojugular reflux; brisk carotid pulses without delay and no carotid bruits Chest: clear to  auscultation, no signs of consolidation by percussion or palpation, normal fremitus, symmetrical and full respiratory excursions Cardiovascular: normal position and quality of the apical impulse, regular rhythm, normal first and loud second heart sounds, no rubs, 2/6 holosystolic murmur Abdomen: no tenderness or distention, no masses by palpation, no abnormal pulsatility or arterial bruits, normal bowel sounds, no hepatosplenomegaly Extremities: no clubbing, cyanosis or edema; 2+ radial, ulnar and brachial pulses bilaterally; 2+ right femoral, posterior tibial and dorsalis pedis pulses; 2+ left femoral, posterior tibial and dorsalis pedis pulses; no subclavian or femoral bruits Neurological: grossly nonfocal  LABS  CBC  Recent Labs  03/11/14 2127 03/12/14 0638 03/13/14 0326  WBC 5.0 4.7 4.4  NEUTROABS 2.9  --   --   HGB 9.1* 8.5* 8.7*  HCT 28.1* 26.0* 27.1*  MCV 82.9 80.2 83.1  PLT 129* 126* 689*   Basic Metabolic Panel  Recent Labs  03/12/14 0638 03/13/14 0326  NA 140 141  K 3.8 4.4  CL 110 108  CO2 25 23  GLUCOSE 92 126*  BUN 56* 55*  CREATININE 1.47* 1.50*  CALCIUM 9.5 9.5   Liver Function Tests No  results for input(s): AST, ALT, ALKPHOS, BILITOT, PROT, ALBUMIN in the last 72 hours. No results for input(s): LIPASE, AMYLASE in the last 72 hours. Cardiac Enzymes  Recent Labs  03/11/14 2355 03/12/14 0638  TROPONINI <0.03 <0.03   BNP Invalid input(s): POCBNP D-Dimer  Recent Labs  03/12/14 0230  DDIMER 2.35*   Radiology Studies Imaging results have been reviewed and Ct Abdomen Pelvis Wo Contrast  03/11/2014   CLINICAL DATA:  Initial evaluation for shortness of breath, abdominal pain.  EXAM: CT ABDOMEN AND PELVIS WITHOUT CONTRAST  TECHNIQUE: Multidetector CT imaging of the abdomen and pelvis was performed following the standard protocol without IV contrast.  COMPARISON:  None.  FINDINGS: Bilateral pleural effusions are partially visualized, right greater than  left. There is associated bibasilar atelectasis. Moderate to large pericardial effusion is partially visualized.  Scattered cystic lesions within the right hepatic lobe are stable. Liver is otherwise unremarkable. Gallbladder within normal limits. No biliary dilatation. Spleen, adrenal glands, and pancreas demonstrate a normal unenhanced appearance.  Kidneys are equal in size without evidence of nephrolithiasis or hydronephrosis.  Distal esophagus is somewhat patulous. Stomach within normal limits. No evidence for bowel obstruction. No abnormal wall thickening or inflammatory changes seen about the bowels.  Bladder within normal limits. Uterus and ovaries are not visualized.  Small volume free fluid seen adjacent to the liver.  No free air.  Retroperitoneal adenopathy again seen, similar to prior. Again, the most prominent node is seen in the left periaortic region and 1.5 cm in short axis (series 2, image 50, previously 1.6 cm). Left iliac node measures up to 1.3 cm in short axis (series 2, image 73). There are additional mildly prominent but not enlarged iliac nodes bilaterally. No pathologically enlarged inguinal lymph nodes identified. Overall, the degree of adenopathy is stable to slightly decreased from prior study.  No acute osseous abnormality. No worrisome lytic or blastic osseous lesions.  Mild diffuse anasarca.  IMPRESSION: 1. Moderate pericardial effusion with small bilateral pleural effusions, right greater than left, and small volume ascites. 2. Stable to slightly decreased size of retroperitoneal and iliac adenopathy as above. These nodes are indeterminate. Continued attention on followup is recommended. Additionally, correlation with PET-CT could be considered for further evaluation. 3. No other acute intra-abdominal pelvic process identified.   Electronically Signed   By: Jeannine Boga M.D.   On: 03/11/2014 23:59   Dg Chest 2 View  03/13/2014   CLINICAL DATA:  Shortness of breath.  EXAM:  CHEST  2 VIEW  COMPARISON:  CT 02/15/2012.  Chest x-ray 12/26/2013.  FINDINGS: Mediastinum hilar structures are unremarkable. Persistent severe cardiomegaly. Pulmonary venous congestion. Diffuse from interstitial prominence and mild pleural effusions previous present consistent with congestive heart failure. A component of the severe cardiomegaly may be related to previously identified pericardial effusion. No pneumothorax. No acute osseus abnormality.  IMPRESSION: 1. Severe cardiomegaly with congestive heart failure. Bilateral interstitial edema and small pleural effusions are present. 2. A component of the cardiomegaly may be related to previously identified pericardial effusion.   Electronically Signed   By: [place]   On: 03/13/2014 07:57   Dg Chest 2 View  03/11/2014   CLINICAL DATA:  Chest pain and shortness of breath.  EXAM: CHEST  2 VIEW  COMPARISON:  Chest radiograph 12/26/2013  FINDINGS: Marked cardiomegaly. Bilateral perihilar and diffuse interstitial pulmonary opacities. No definite pleural effusion or pneumothorax. Mid thoracic spine degenerative change. Prominent small bowel loops, incompletely visualized.  IMPRESSION: Cardiomegaly and mild interstitial  pulmonary edema.  Prominent upper abdominal small bowel loops, incompletely visualized. Consider correlation with dedicated abdominal radiography.   Electronically Signed   By: Lovey Newcomer M.D.   On: 03/11/2014 21:15   Nm Pulmonary Perf And Vent  03/12/2014   CLINICAL DATA:  63 year old female with acute shortness of breath and chest pain. Initial encounter.  EXAM: NUCLEAR MEDICINE PERFUSION LUNG SCAN  TECHNIQUE: Perfusion images were obtained in multiple projections after intravenous injection of radiopharmaceutical.  The patient was un able to perform ventilation.  After only two image acquisitions, the patient did not want to continue with the perfusion study.  RADIOPHARMACEUTICALS:  6 mCi Tc-40mMAA  COMPARISON:  03/11/2014 abdominal  CT and chest radiograph.  FINDINGS: No perfusion defects are identified.  Cardiomegaly and pericardial effusion as seen on CT attenuates left lower lung activity.  IMPRESSION: No perfusion defects identified -no evidence of pulmonary emboli.   Electronically Signed   By: JHassan RowanM.D.   On: 03/12/2014 19:04   Dg Abd Portable 1v  03/11/2014   CLINICAL DATA:  One day history of abdominal pain  EXAM: PORTABLE ABDOMEN - 1 VIEW  COMPARISON:  June 07, 2013  FINDINGS: There is moderate stool in the colon. Bowel gas pattern is unremarkable. No obstruction or free air is seen on this supine examination. There are small phleboliths in the pelvis. Liver appears prominent but stable.  IMPRESSION: Bowel gas pattern unremarkable.  Liver prominent but stable.   Electronically Signed   By: WLowella GripM.D.   On: 03/11/2014 21:45    TELE NSR  ECG NSR, low voltage, RVH/pulmonary disease pattern  ASSESSMENT AND PLAN  Very challenging to make a diagnosis of tamponade in this patient with systemic-level pulmonary artery HTN. With severe PAH, typical echo findings of RA/RV collapse and respiratory flow variation become very insensitive.  Her presentation with hypotension and severe dyspnea may be secondary to worsening PAH or tamponade, both causing worsening signs of R heart failure.  Rather than reintroducing adcirca, consider using Revatio (sildenafil) with a much shorter duration. If this causes hypotension again, it will wear off sooner. Would appreciate pulmonary MD opinion.  Ultimately, the only way to confirm tamponade may be pericardiocentesis. Reviewed echo images with Dr. CBurt Knack Procedure is technically feasible, but not low risk.   MSanda Klein MD, F[facility] [phone] 03/13/2014 8:11 AM

## 2014-03-13 NOTE — Progress Notes (Signed)
UR Completed Jacqlyn Krauss, RN,BSN [phone]

## 2014-03-14 DIAGNOSIS — I5022 Chronic systolic (congestive) heart failure: Secondary | ICD-10-CM

## 2014-03-14 DIAGNOSIS — J9620 Acute and chronic respiratory failure, unspecified whether with hypoxia or hypercapnia: Secondary | ICD-10-CM

## 2014-03-14 LAB — HEPATIC FUNCTION PANEL
ALBUMIN: 3.3 g/dL — AB (ref 3.5–5.2)
ALK PHOS: 107 U/L (ref 39–117)
ALT: 10 U/L (ref 0–35)
AST: 28 U/L (ref 0–37)
BILIRUBIN DIRECT: 0.2 mg/dL (ref 0.0–0.3)
BILIRUBIN TOTAL: 0.8 mg/dL (ref 0.3–1.2)
Indirect Bilirubin: 0.6 mg/dL (ref 0.3–0.9)
Total Protein: 7.5 g/dL (ref 6.0–8.3)

## 2014-03-14 LAB — CBC
HCT: 24.4 % — ABNORMAL LOW (ref 36.0–46.0)
Hemoglobin: 8.1 g/dL — ABNORMAL LOW (ref 12.0–15.0)
MCH: 27.2 pg (ref 26.0–34.0)
MCHC: 33.2 g/dL (ref 30.0–36.0)
MCV: 81.9 fL (ref 78.0–100.0)
Platelets: 123 10*3/uL — ABNORMAL LOW (ref 150–400)
RBC: 2.98 MIL/uL — ABNORMAL LOW (ref 3.87–5.11)
RDW: 15 % (ref 11.5–15.5)
WBC: 4.4 10*3/uL (ref 4.0–10.5)

## 2014-03-14 LAB — BRAIN NATRIURETIC PEPTIDE: B Natriuretic Peptide: 309.6 pg/mL — ABNORMAL HIGH (ref 0.0–100.0)

## 2014-03-14 LAB — MRSA PCR SCREENING: MRSA by PCR: NEGATIVE

## 2014-03-14 MED ORDER — MILRINONE IN DEXTROSE 20 MG/100ML IV SOLN: 0.2500 ug/kg/min | INTRAVENOUS | Status: DC

## 2014-03-14 MED ORDER — FUROSEMIDE 10 MG/ML IJ SOLN
40.0000 mg | Freq: Two times a day (BID) | INTRAMUSCULAR | Status: DC
  Administered 2014-03-14 – 2014-03-17 (×6): 40 mg via INTRAVENOUS
  Filled 2014-03-14 (×9): qty 4

## 2014-03-14 MED ORDER — MILRINONE IN DEXTROSE 20 MG/100ML IV SOLN
0.2500 ug/kg/min | INTRAVENOUS | Status: DC
  Administered 2014-03-14 – 2014-03-19 (×9): 0.25 ug/kg/min via INTRAVENOUS
  Filled 2014-03-14 (×9): qty 100

## 2014-03-14 MED ORDER — SODIUM CHLORIDE 0.9 % IV SOLN
INTRAVENOUS | Status: DC
  Administered 2014-03-14: 5 mL/h via INTRAVENOUS
  Administered 2014-03-16: 14:00:00 via INTRAVENOUS

## 2014-03-14 NOTE — Progress Notes (Signed)
Internal Medicine Attending  Date: 03/14/2014  Patient name: [name] Medical record number: [id number] Date of birth: [birth year] Age: 63 y.o. Gender: female  I saw and evaluated the patient. I discussed patient and reviewed the resident's note by Dr. Posey Pronto, and I agree with the resident's findings and plans as documented in his note, with the following additional comments.  Patient reports that her breathing is stable compared to yesterday; she reports increased shortness of breath when up out of bed.  Care is being guided by pulmonology and cardiology given her severe pulmonary hypertension and large pericardial effusion.  I discussed patient by phone with Dr. Lamonte Sakai, who recommended transfering patient to the ICU service for inotropic support in hopes of pursuing a right heart catheterization.

## 2014-03-14 NOTE — Progress Notes (Signed)
Pt transferred to 2 heart room #12 for higher level of care.

## 2014-03-14 NOTE — Progress Notes (Signed)
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   At some point has been on Tyvaso, ventavis, adcirca, bosentan, coumadin and macitentan but was either non compliant or intolerant.  Most recently started on letairis (10/15).  Has had progressive worsening of pulmonary HTN since 2010 but overall has remained fairly well compensated. She presented 12/28 with acute onset of 3-4 days of increasing dyspnea (now class 4) and worsening orthopnea. Echo showed PASP 1176mg and new mod/large pericardial effusion.  PE r/o by VQ.  PCCM consulted 12/30.    Serial Echo findings - PASP 2010   - 42  - PASP 2013 - 63 - PASP June 2013 - 85 with moderate RV dilatation - PASP on TTE 01/24/13 > stable at 8760m - RV dilated severely - PASP 06/08/13 - 65m49m- small pericardial effusion - RV severely dilated, moderate reduction in EF - PASP 03/12/14 - 113mm73m- large free flowing effusion (VQ scan no PE) - RV moderately dilated   SUBJECTIVE:  C/o pleuritic chest pain, RUQ abd pain overnight.   VITAL SIGNS: Temp:  [97.6 F (36.4 C)-98.3 F (36.8 C)] 97.9 F (36.6 C) (12/31 0500) Pulse Rate:  [57-77] 66 (12/31 1000) Resp:  [14-23] 16 (12/31 1000) BP: (98-113)/(52-70) 110/70 mmHg (12/31 0813) SpO2:  [97 %-100 %] 99 % (12/31 1000) Weight:  [168 lb 3.2 oz (76.295 kg)] 168 lb 3.2 oz (76.295 kg) (12/31 0500) HEMODYNAMICS:   VENTILATOR SETTINGS:   INTAKE / OUTPUT:  Intake/Output Summary (Last 24 hours) at 03/14/14 1116 Last data filed at  03/14/14 1057  Gross per 24 hour  Intake    120 ml  Output   1100 ml  Net   -980 ml    PHYSICAL EXAMINATION: General:  Frail looking female. Sitting in bed. No significant distress Neuro:  AXOX3. Appropriate, MAE, weak HEENT:  Supple NEck, Elevated JVP + Cardiovascular:  Loud P2+, RRR + Lungs:  resps even, non labored at rest, CTA bilaterally but overall diminished Abdomen:  Distended,. RUQ Tender Musculoskeletal:  Trace BLE edema  LABS:  CBC  Recent Labs Lab 03/12/14 0638 03/13/14 0326 03/14/14 0400  WBC 4.7 4.4 4.4  HGB 8.5* 8.7* 8.1*  HCT 26.0* 27.1* 24.4*  PLT 126* 125* 123*   Coag's No results for input(s): APTT, INR in the last 168 hours. BMET  Recent Labs Lab 03/11/14 1927 03/12/14 0638 03/13/14 0326  NA 142 140 141  K 4.0 3.8 4.4  CL 110 110 108  CO2 _0 BUN 58* 56* 55*  CREATININE 1.63* 1.47* 1.50*  GLUCOSE 156* 92 126*   Electrolytes  Recent Labs Lab 03/11/14 1927 03/12/14 0638 03/13/14 0326  CALCIUM 9.5 9.5 9.5   Sepsis Markers  Recent Labs Lab 03/13/14 1637  LATICACIDVEN 1.3   ABG  Recent Labs Lab 03/13/14 1445  PHART 7.407  PCO2ART 37.6  PO2ART 98.3   Liver Enzymes  Recent Labs Lab 03/14/14 0400  AST 28  ALT 10  ALKPHOS 107  BILITOT 0.8  ALBUMIN  3.3*   Cardiac Enzymes  Recent Labs Lab 03/11/14 2355 03/12/14 0638  TROPONINI <0.03 <0.03   Glucose No results for input(s): GLUCAP in the last 168 hours.  Imaging Dg Chest 2 View  03/13/2014   CLINICAL DATA:  Shortness of breath.  EXAM: CHEST  2 VIEW  COMPARISON:  CT 02/15/2012.  Chest x-ray 12/26/2013.  FINDINGS: Mediastinum hilar structures are unremarkable. Persistent severe cardiomegaly. Pulmonary venous congestion. Diffuse from interstitial prominence and mild pleural effusions previous present consistent with congestive heart failure. A component of the severe cardiomegaly may be related to previously identified pericardial effusion. No pneumothorax. No  acute osseus abnormality.  IMPRESSION: 1. Severe cardiomegaly with congestive heart failure. Bilateral interstitial edema and small pleural effusions are present. 2. A component of the cardiomegaly may be related to previously identified pericardial effusion.   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  Strong history of poor compliance / intolerance to rx.  Suspect increased dyspnea and hypotension are r/t overall worsening of PAH, c/b poor compliance with new pericardial effusion contributing to dyspnea as well.   Hypotension large new pericardial effusion. Likely reflects her overall total body volume overload, 3rd spacing. Unclear that it is having hemodynamic effects although possible given its size Pleural effusion.    Discussion:  - Interesting case > Her PASP has been profoundly elevated for months (years actually) without much dyspnea. Certainly most likely scenario is that the [facility] has progressed and she has decompensated sx. Must also consider acute events with more precipitous decline. No evidence PE on V/q scan 12/29, CXR looks like edema and cardiomegaly; doubt acute or chronic change due to scleroderma. Pericardial effusion may be playing a role, difficult to say based on only TTE findings.   P:   -Continue letairis and agree with change to Revatio  (PDE inhibitor with shorter half life than adcirca) for now although she has had side effects with this in the past -Consider gentle diuresis as BP and renal fxn can tolerate - I suspect that in order to acconplish this she will need to be moved to ICU to received milrinone +/- pressors - I believe she needs a R heart cath  - her estimated PASP's in the past have been elevated out of proportion to her clinical status. Would like to wait to do this until we can diurese and get her to optimal volume status  and cardiac output.  - she will need a pericardiocentesis, if only to allow accurate measurements on PA-C. This is high risk with her degree of PAH, will explain this to her.  - My drug of choice for her would be to restart tyvaso. She tolerated it fairly well, stopped it because it was hard to take reliably 4x a day. I don't believe she is a great candidate for IV prostanoids because she has proven to have poor compliance in the past. If she could re-establish care and be complaint, then she may be able to progress to IV therapy at some point. I will work on her fluid status and then consider the tyvaso. Optimally would start in house, acknowledging there are barriers obtaining this.   Nickolas Madrid, NP 03/14/2014  11:16 AM Pager: [phone] or [phone]  Attending Note:  I have examined patient, reviewed labs, studies and notes. I have discussed the case  with Shon Millet, and I agree with the data and plans as amended above.  I have discussed with Dr Sallyanne Kuster, will also call Dr Marinda Elk,.   Baltazar Apo, MD, PhD 03/14/2014, 12:18 PM [facility] [phone] or if no answer [phone]

## 2014-03-14 NOTE — Progress Notes (Signed)
Subjective:  Weak, denies increased SOB but says she has a hard time getting up.   Objective:  Vital Signs in the last 24 hours: Temp:  [97.6 F (36.4 C)-98.3 F (36.8 C)] 97.9 F (36.6 C) (12/31 0500) Pulse Rate:  [57-77] 65 (12/31 0500) Resp:  [18-23] 18 (12/31 0500) BP: (98-113)/(52-67) 100/60 mmHg (12/31 0500) SpO2:  [97 %-100 %] 100 % (12/31 0500) Weight:  [168 lb 3.2 oz (76.295 kg)] 168 lb 3.2 oz (76.295 kg) (12/31 0500)  Intake/Output from previous day:  Intake/Output Summary (Last 24 hours) at 03/14/14 0821 Last data filed at 03/14/14 0500  Gross per 24 hour  Intake    240 ml  Output    900 ml  Net   -660 ml    Physical Exam: General appearance: cooperative and no distress Lungs: clear to auscultation bilaterally Heart: regular rate and rhythm Abdomen: ascites   Rate: 65  Rhythm: normal sinus rhythm  Lab Results:  Recent Labs  03/13/14 0326 03/14/14 0400  WBC 4.4 4.4  HGB 8.7* 8.1*  PLT 125* 123*    Recent Labs  03/12/14 0638 03/13/14 0326  NA 140 141  K 3.8 4.4  CL 110 108  CO2 25 23  GLUCOSE 92 126*  BUN 56* 55*  CREATININE 1.47* 1.50*    Recent Labs  03/11/14 2355 03/12/14 0638  TROPONINI <0.03 <0.03   No results for input(s): INR in the last 72 hours.  Imaging: Imaging results have been reviewed  Cardiac Studies: Echo 03/12/14 Study Conclusions  - Left ventricle: The cavity size was normal. Wall thickness was increased in a pattern of moderate LVH. Systolic function was normal. The estimated ejection fraction was in the range of 60% to 65%. Wall motion was normal; there were no regional wall motion abnormalities. Doppler parameters are consistent with abnormal left ventricular relaxation (grade 1 diastolic dysfunction). - Ventricular septum: The contour showed systolic flattening. - Mitral valve: There was mild regurgitation. - Left atrium: The atrium was moderately dilated. - Right ventricle: The cavity  size was moderately dilated. Systolic function was reduced. - Right atrium: The atrium was moderately to severely dilated. - Tricuspid valve: There was moderate regurgitation. - Pulmonary arteries: PA peak pressure: 111 mm Hg (S). - Pericardium, extracardiac: A large, free-flowing pericardial effusion was identified circumferential to the heart. The fluid had no internal echoes.There was no evidence of hemodynamic compromise.  Impressions:  - 1. Large pericardial effusion. No evidence of hemodynamic compromise. 2. Severe pulmonary hypertension with estimated PA pressure 111 mm Hg.   Assessment/Plan:  63 year old woman with scleroderma and severe chronic right heart failure admitted 12/29 with increasing dyspnea. Echo shows large pericardial effusion.    Principal Problem:   Acute on chronic respiratory failure Active Problems:   PAH (pulmonary artery hypertension)   DM type 2 with diabetic peripheral neuropathy   Chronic systolic right heart failure   Hypotension   CKD (chronic kidney disease), stage III   Pericardial effusion   Pleural effusion, bilateral   GERD   SCLERODERMA   Anemia   Ascites   Elevated d-dimer-VQ negative 12/29   PLAN: Will review with MD- ? Consider tapping pericardial effusion. Also may want to discuss a supervised care situation since medication compliance has been an issue in the past.   Vergia Alberts [phone] 03/14/2014, 8:21 AM   I have seen and examined the patient along with Kerin Ransom PA-C.  I have reviewed the chart, notes and new  data.  I agree with PA's note.  PLAN:  Overall, impression is of progressive PAH with secondary right heart failure, rather than tamponade. So far tolerating Revatio without systemic hypotension. Other than IV prostaglandin (not available in this center) there are no interventions available. Prognosis is poor.  Sanda Klein, MD, [place] [phone] 03/14/2014, 8:50 AM

## 2014-03-14 NOTE — Progress Notes (Signed)
Subjective: Yesterday, she was started on [name], an analogue to Adcirca with a shorter half-life with a consideration for IV flolan if she is agreeable to it.   This AM, her sister is at bedside. She appears more relaxed and comfortable. I spoke to her about her concerns, and she is afraid of becoming worse. We spoke about what she enjoys, and she prefers staying home rearing a 63 year old. I spoke to her about palliative care should they decide not to pursue treatment with IV flolan, and her sister reported that their mother had a good experience with home hospice.   Later this AM, decision was made to transfer to ICU for gentle diuresis and possible R heart cath & pericardiocentesis.  Objective: Vital signs in last 24 hours: Filed Vitals:   03/14/14 0500 03/14/14 0813 03/14/14 1000 03/14/14 1200  BP: 100/60 110/70  113/56  Pulse: 65 63 66 61  Temp: 97.9 F (36.6 C)   98.4 F (36.9 C)  TempSrc: Oral   Oral  Resp: _0 Height:      Weight: 168 lb 3.2 oz (76.295 kg)     SpO2: 100% 99% 99% 97%   Weight change: 8 oz (0.227 kg)  Intake/Output Summary (Last 24 hours) at 03/14/14 1315 Last data filed at 03/14/14 1057  Gross per 24 hour  Intake    120 ml  Output   1100 ml  Net   -980 ml   General: resting in bed, 2L O2 by [place] HEENT: PERRL, EOMI, no scleral icterus, oropharynx clear Cardiac: RRR, no rubs, murmurs or gallops Pulm: clear to auscultation bilaterally, resonant to percussion bilaterally of the posterior lung fields Abd: soft, no tenderness elicited on palpation, nondistended, BS present  Ext: warm and well perfused, no pedal edema, R knee scar from prior surgery, no tenderness to palpation of legs Neuro: responds to questions appropriately; moving all extremities freely   Lab Results: Basic Metabolic Panel:  Recent Labs Lab 03/12/14 0638 03/13/14 0326  NA 140 141  K 3.8 4.4  CL 110 108  CO2 25 23  GLUCOSE 92 126*  BUN 56* 55*  CREATININE 1.47*  1.50*  CALCIUM 9.5 9.5   CBC:  Recent Labs Lab 03/11/14 2127  03/13/14 0326 03/14/14 0400  WBC 5.0  < > 4.4 4.4  NEUTROABS 2.9  --   --   --   HGB 9.1*  < > 8.7* 8.1*  HCT 28.1*  < > 27.1* 24.4*  MCV 82.9  < > 83.1 81.9  PLT 129*  < > 125* 123*  < > = values in this interval not displayed. Cardiac Enzymes:  Recent Labs Lab 03/11/14 2355 03/12/14 0638  TROPONINI <0.03 <0.03   D-Dimer:  Recent Labs Lab 03/12/14 0230  DDIMER 2.35*   Urine Drug Screen: Drugs of Abuse     Component Value Date/Time   LABOPIA POSITIVE* 10/02/2010 1128   COCAINSCRNUR NONE DETECTED 10/02/2010 1128   LABBENZ NONE DETECTED 10/02/2010 1128   AMPHETMU NONE DETECTED 10/02/2010 1128   THCU NONE DETECTED 10/02/2010 1128   LABBARB NONE DETECTED 10/02/2010 1128    Micro Results: No results found for this or any previous visit (from the past 240 hour(s)).  Studies/Results: Dg Chest 2 View  03/13/2014   CLINICAL DATA:  Shortness of breath.  EXAM: CHEST  2 VIEW  COMPARISON:  CT 02/15/2012.  Chest x-ray 12/26/2013.  FINDINGS: Mediastinum hilar structures are unremarkable. Persistent severe cardiomegaly. Pulmonary venous congestion. Diffuse  from interstitial prominence and mild pleural effusions previous present consistent with congestive heart failure. A component of the severe cardiomegaly may be related to previously identified pericardial effusion. No pneumothorax. No acute osseus abnormality.  IMPRESSION: 1. Severe cardiomegaly with congestive heart failure. Bilateral interstitial edema and small pleural effusions are present. 2. A component of the cardiomegaly may be related to previously identified pericardial effusion.   Electronically Signed   By: [name]  [name]   On: 03/13/2014 07:57   Nm Pulmonary Perf And Vent  03/12/2014   CLINICAL DATA:  63 year old female with acute shortness of breath and chest pain. Initial encounter.  EXAM: NUCLEAR MEDICINE PERFUSION LUNG SCAN  TECHNIQUE: Perfusion  images were obtained in multiple projections after intravenous injection of radiopharmaceutical.  The patient was un able to perform ventilation.  After only two image acquisitions, the patient did not want to continue with the perfusion study.  RADIOPHARMACEUTICALS:  6 mCi Tc-36mMAA  COMPARISON:  03/11/2014 abdominal CT and chest radiograph.  FINDINGS: No perfusion defects are identified.  Cardiomegaly and pericardial effusion as seen on CT attenuates left lower lung activity.  IMPRESSION: No perfusion defects identified -no evidence of pulmonary emboli.   Electronically Signed   By: Pamela RowanM.D.   On: 03/12/2014 19:04   Medications: I have reviewed the patient's current medications. Scheduled Meds: . ambrisentan  5 mg Oral Daily  . aspirin EC  81 mg Oral Daily  . fenofibrate  54 mg Oral Daily  . ferrous sulfate  325 mg Oral Q breakfast  . FLUoxetine  20 mg Oral q morning - 10a  . heparin subcutaneous  5,000 Units Subcutaneous 3 times per day  . pantoprazole  40 mg Oral Daily  . pregabalin  150 mg Oral TID  . sildenafil  20 mg Oral TID  . 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   Pulmonary HTN: Pulmonary artery pressure 111, up from 97 back in March per echo. Her disease has progressed in the setting of non-adherence to treatment though unsure of how much pericardial effusion is contributing to her current presentation. -Transfer to ICU for gentle, diuresis, possible R heart cath & pericardiocentesis as noted above -Continue [name] & [name] per Pulmonology recs -Continue empiric IV heparin -Cardiology & Pulmonology following, appreciate recs  Mixed CHF: EF 60-65% with grade  1 diastolic dysfunction,  dilated RV likely 2/2 pulmonary HTN. Follows with [name] -Continue home ASA   Scleroderma: Last labs in our system on July 2012 are unremarkable for both anti-Scl 70 & anti-centromere Ab. Follows with Dr. BAmil Amenwho last saw her back in August and has otherwise stable disease; diagnosis made by another provider before he started working.  -Continue home Norco  DM2 complicated by gastroparesis & peripheral neuropathy: A1c 5.3 in October.  -Continue home Lyrica  CKD Stage 3: Crt 1.5, yesterday with baseline 1.2-1.4.  -Continue assessing.  Anemia: Hb 8.7, stable from yesterday but downtrending from 11 in March. FOBT positive yesterday. Microsopic colitis confirmed on biopsies at most recent colonscopy (July 2015). -Does not show signs of active bleeding and can defer management as outpatient  Hypertriglyceridemia: Continue home fenofibrate.  Depression: Continue home fluoxetine, Lyrica, Norco.  GERD: Continue Protonix.  #FEN:  -Diet: Heart  Healthy  #DVT prophylaxis: heparin per pharmacy  #CODE STATUS: FULL CODE  Dispo: Disposition is deferred at this time, awaiting improvement of current medical problems. Transfer to ICU.  The patient does have a current PCP Sid Falcon, MD) and does need an [facility] hospital follow-up appointment after discharge.  The patient does not know have transportation limitations that hinder transportation to clinic appointments.  .Services Needed at time of discharge: Y = Yes, Blank = No PT:   OT:   RN:   Equipment:   Other:     LOS: 3 days   Charlott Rakes, MD 03/14/2014, 1:15 PM

## 2014-03-14 NOTE — Care Management Note (Addendum)
    Page 1 of 1   03/25/2014     9:28:44 AM CARE MANAGEMENT NOTE 03/25/2014  Patient:  [name], [name]   Account Number:  [id number]  Date Initiated:  03/14/2014  Documentation initiated by:  GRAVES-BIGELOW,BRENDA  Subjective/Objective Assessment:   63 year old woman with scleroderma and severe chronic right heart failure admitted with increasing dyspnea. Echo shows large pericardial effusion.     Action/Plan:   CM to monitor for disposition needs. Pt is currently on Revatio. Transferred to [place] and placed on milrinone gtt. CM did a benefits check for revatio, however unable to get a co pay. CM on 2H to call pt's pharmacy to get co pay.   Anticipated DC Date:     Anticipated DC Plan:  [place]  CM consult      Choice offered to / List presented to:             Status of service:  Completed, signed off Medicare Important Message given?  YES (If response is "NO", the following Medicare IM given date fields will be blank) Date Medicare IM given:  03/25/2014 Medicare IM given by:  Elissa Hefty Date Additional Medicare IM given:  03/22/2014 Additional Medicare IM given by:  [facility] Jonta Gastineau  Discharge Disposition:    Per UR Regulation:  Reviewed for med. necessity/level of care/duration of stay  If discussed at [place] of Stay Meetings, dates discussed:   03/21/2014  03/26/2014    Comments:  03-14-14 Insuranc Provider: state that this medication Revatio is covered by [employer], however could not give cost- stating that pt would need to call insurance provider. No further needs at this time.

## 2014-03-15 LAB — BRAIN NATRIURETIC PEPTIDE: B Natriuretic Peptide: 223.6 pg/mL — ABNORMAL HIGH (ref 0.0–100.0)

## 2014-03-15 LAB — BASIC METABOLIC PANEL
ANION GAP: 12 (ref 5–15)
BUN: 47 mg/dL — ABNORMAL HIGH (ref 6–23)
CHLORIDE: 107 meq/L (ref 96–112)
CO2: 22 mmol/L (ref 19–32)
CREATININE: 1.35 mg/dL — AB (ref 0.50–1.10)
Calcium: 9.2 mg/dL (ref 8.4–10.5)
GFR calc Af Amer: 48 mL/min — ABNORMAL LOW (ref >90)
GFR, EST NON AFRICAN AMERICAN: 41 mL/min — AB (ref >90)
GLUCOSE: 83 mg/dL (ref 70–99)
Potassium: 4.6 mmol/L (ref 3.5–5.1)
SODIUM: 141 mmol/L (ref 135–145)

## 2014-03-15 LAB — CBC
HEMATOCRIT: 25.1 % — AB (ref 36.0–46.0)
HEMOGLOBIN: 8.4 g/dL — AB (ref 12.0–15.0)
MCH: 27.4 pg (ref 26.0–34.0)
MCHC: 33.5 g/dL (ref 30.0–36.0)
MCV: 81.8 fL (ref 78.0–100.0)
PLATELETS: 117 10*3/uL — AB (ref 150–400)
RBC: 3.07 MIL/uL — ABNORMAL LOW (ref 3.87–5.11)
RDW: 14.9 % (ref 11.5–15.5)
WBC: 4.5 10*3/uL (ref 4.0–10.5)

## 2014-03-15 LAB — MAGNESIUM: MAGNESIUM: 1.7 mg/dL (ref 1.5–2.5)

## 2014-03-15 LAB — PHOSPHORUS: Phosphorus: 2.9 mg/dL (ref 2.3–4.6)

## 2014-03-15 NOTE — Progress Notes (Signed)
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   At some point has been on Tyvaso, ventavis, adcirca, bosentan, coumadin and macitentan but was either non compliant or intolerant.  Most recently started on letairis (10/15).  Has had progressive worsening of pulmonary HTN since 2010 but overall has remained fairly well compensated. She presented 12/28 with acute onset of 3-4 days of increasing dyspnea (now class 4) and worsening orthopnea. Echo showed PASP 1146mg and new mod/large pericardial effusion.  PE r/o by VQ.  PCCM consulted 12/30.    Serial Echo findings - PASP 2010   - 42  - PASP 2013 - 63 - PASP June 2013 - 85 with moderate RV dilatation - PASP on TTE 01/24/13 > stable at 8743m - RV dilated severely - PASP 06/08/13 - 33m42m- small pericardial effusion - RV severely dilated, moderate reduction in EF - PASP 03/12/14 - 113mm67m- large free flowing effusion (VQ scan no PE) - RV moderately dilated  SIGNIFICANT EVENTS: 03/11/2014 - admit 03/13/2014 - PCCM consult  03/14/14: C/o pleuritic chest pain, RUQ abd pain overnight.    SUBJECTIVE/OVERNIGHT/INTERVAL HX 03/15/14: Now in 2H, oWillaminamilrinone gtt. Awaiting cath. Unsure if she is better or not - poor historian. Wants to know when her cath is  VITAL SIGNS: Temp:  [97.8 F (36.6 C)-98.1 F (36.7 C)] 97.8 F (36.6 C) (01/01 1031) Pulse Rate:  [61-123] 77 (01/01 1300) Resp:  [11-18] 16 (01/01 1600) BP: (81-114)/(33-63) 114/53 mmHg (01/01 1600) SpO2:  [94 %-100 %] 99 % (01/01 1300) Weight:  [74.6 kg (164 lb 7.4 oz)] 74.6 kg (164 lb 7.4  oz) (01/01 0438) HEMODYNAMICS:   VENTILATOR SETTINGS:   INTAKE / OUTPUT:  Intake/Output Summary (Last 24 hours) at 03/15/14 1716 Last data filed at 03/15/14 1600  Gross per 24 hour  Intake  944.1 ml  Output   4950 ml  Net -4005.9 ml    PHYSICAL EXAMINATION: General:  Frail looking female. Sitting in chair by side of bed. No significant distress Neuro:  AXOX3. Appropriate, MAE, weak HEENT:  Supple NEck, Elevated JVP + Cardiovascular:  Loud P2+, RRR + Lungs:  resps even, non labored at rest, CTA bilaterally but overall diminished Abdomen:  Distended,. RUQ Tender Musculoskeletal:  Trace BLE edema  LABS: PULMONARY  Recent Labs Lab 03/13/14 1445  PHART 7.407  PCO2ART 37.6  PO2ART 98.3  HCO3 23.2  TCO2 24.3  O2SAT 98.2    CBC  Recent Labs Lab 03/13/14 0326 03/14/14 0400 03/15/14 0300  HGB 8.7* 8.1* 8.4*  HCT 27.1* 24.4* 25.1*  WBC 4.4 4.4 4.5  PLT 125* 123* 117*    COAGULATION No results for input(s): INR in the last 168 hours.  CARDIAC   Recent Labs Lab 03/11/14 2355 03/12/14 0638  TROPONINI <0.03 <0.03   No results for input(s): PROBNP in the last 168 hours.   CHEMISTRY  Recent Labs Lab 03/11/14 1927 03/12/14 0638 03/13/14 0326 03/15/14 0300  NA 142 140 141 141  K 4.0 3.8 4.4 4.6  CL 110 110 108 107  CO2 20 25  23 22  GLUCOSE 156* 92 126* 83  BUN 58* 56* 55* 47*  CREATININE 1.63* 1.47* 1.50* 1.35*  CALCIUM 9.5 9.5 9.5 9.2  MG  --   --   --  1.7  PHOS  --   --   --  2.9   Estimated Creatinine Clearance: 42.8 mL/min (by C-G formula based on Cr of 1.35).   LIVER  Recent Labs Lab 03/14/14 0400  AST 28  ALT 10  ALKPHOS 107  BILITOT 0.8  PROT 7.5  ALBUMIN 3.3*     INFECTIOUS  Recent Labs Lab 03/13/14 1637  LATICACIDVEN 1.3     ENDOCRINE CBG (last 3)  No results for input(s): GLUCAP in the last 72 hours.       IMAGING x48h No results found.     ASSESSMENT / PLAN:  PULMONARY  A:  Severe Pulmonary  Hypertension due to Scleroderma - Class 4 dyspnea.  Strong history of poor compliance / intolerance to rx.  Suspect increased dyspnea and hypotension are r/t overall worsening of PAH, c/b poor compliance with new pericardial effusion contributing to dyspnea as well.   Hypotension large new pericardial effusion. Likely reflects her overall total body volume overload, 3rd spacing. Unclear that it is having hemodynamic effects although possible given its size Pleural effusion.    Discussion:  > Her PASP has been profoundly elevated for months (years actually) without much dyspnea. Certainly most likely scenario is that the [facility] has progressed and she has decompensated sx. Must also consider acute events with more precipitous decline. No evidence PE on V/q scan 12/29, CXR looks like edema and cardiomegaly; doubt acute or chronic change due to scleroderma. Pericardial effusion may be playing a role, difficult to say based on only TTE findings.   P:   -Continue letairis +  Revatio  (PDE inhibitor with shorter half life than adcirca) for now although she has had side effects with this in the past -Consider gentle diuresis as BP and renal fxn can tolerate - Continue milrinone per catds - I suspect that in order to acconplish this she will need to be moved to ICU to received milrinone +/- pressors - awiat Right heart cath and pericardiocentesis  - Restart tyvaso. She tolerated it fairly well, stopped it because it was hard to take reliably 4x a day.    - Don't believe she is a great candidate for IV prostanoids because she has proven to have poor compliance in the past and in fact 03/15/14 she declined it      Dr. Brand Males, M.D., [facility].C.P Pulmonary and Critical Care Medicine Staff Physician [facility] Pager: [phone], If no answer or between  15:00h - 7:00h: call [phone]  03/15/2014 5:23 PM

## 2014-03-15 NOTE — Progress Notes (Signed)
Subjective: Patinet breathing a little better   Objective: Filed Vitals:   03/15/14 0600 03/15/14 0700 03/15/14 0800 03/15/14 1031  BP: 82/56 85/51 109/63 109/50  Pulse: 68 63 73 71  Temp:    97.8 F (36.6 C)  TempSrc:    Oral  Resp: _0 Height:      Weight:      SpO2: 94% 94% 97% 100%   Weight change: 0.2 oz (0.005 kg)  Intake/Output Summary (Last 24 hours) at 03/15/14 1148 Last data filed at 03/15/14 1000  Gross per 24 hour  Intake 436.18 ml  Output   4700 ml  Net -4263.82 ml    General: Alert, awake, oriented x3, in no acute distress Neck:  JVP is normal Heart: Regular rate and rhythm, Increased P2  without murmurs, rubs, gallops.  Lungs: Clear to auscultation.  No rales or wheezes. Exemities:  Tr edema.   Neuro: Grossly intact, nonfocal.  Tele:   SR   Lab Results: Results for orders placed or performed during the hospital encounter of 03/11/14 (from the past 24 hour(s))  MRSA PCR Screening     Status: None   Collection Time: 03/14/14  2:39 PM  Result Value Ref Range   MRSA by PCR NEGATIVE NEGATIVE  Basic metabolic panel     Status: Abnormal   Collection Time: 03/15/14  3:00 AM  Result Value Ref Range   Sodium 141 135 - 145 mmol/L   Potassium 4.6 3.5 - 5.1 mmol/L   Chloride 107 96 - 112 mEq/L   CO2 22 19 - 32 mmol/L   Glucose, Bld 83 70 - 99 mg/dL   BUN 47 (H) 6 - 23 mg/dL   Creatinine, Ser 1.35 (H) 0.50 - 1.10 mg/dL   Calcium 9.2 8.4 - 10.5 mg/dL   GFR calc non Af Amer 41 (L) >90 mL/min   GFR calc Af Amer 48 (L) >90 mL/min   Anion gap 12 5 - 15  Magnesium     Status: None   Collection Time: 03/15/14  3:00 AM  Result Value Ref Range   Magnesium 1.7 1.5 - 2.5 mg/dL  Phosphorus     Status: None   Collection Time: 03/15/14  3:00 AM  Result Value Ref Range   Phosphorus 2.9 2.3 - 4.6 mg/dL  CBC     Status: Abnormal   Collection Time: 03/15/14  3:00 AM  Result Value Ref Range   WBC 4.5 4.0 - 10.5 K/uL   RBC 3.07 (L) 3.87 - 5.11 MIL/uL   Hemoglobin 8.4 (L) 12.0 - 15.0 g/dL   HCT 25.1 (L) 36.0 - 46.0 %   MCV 81.8 78.0 - 100.0 fL   MCH 27.4 26.0 - 34.0 pg   MCHC 33.5 30.0 - 36.0 g/dL   RDW 14.9 11.5 - 15.5 %   Platelets 117 (L) 150 - 400 K/uL  Brain natriuretic peptide     Status: Abnormal   Collection Time: 03/15/14  3:00 AM  Result Value Ref Range   B Natriuretic Peptide 223.6 (H) 0.0 - 100.0 pg/mL    Studies/Results: No results found.  Medications: Reviewed   _1 @  Patient is a 64 yo who is familiar to me in that I had seen her as part of coumadin f/u R Byrum  has been main physician involved in her care.  She has had poor compliance with all of there meds.  Now with increased SOB     1.  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  Other regimens not decided yet.   Patient is diuresing    I have reviewed with Einar Crow I have asked him to see patient this weekend for establishment of care. Wll need to review for possible R heart cath and pericardiocentesis.  Would favor diuresing and reeval with echo prior to any attempt to drain.  BP is adequate for now  NO evid for tamponade    2.  Anemia  Stable  Not on anticoag  Fllow     LOS: 4 days   Dorris Carnes 03/15/2014, 11:48 AM

## 2014-03-16 LAB — BASIC METABOLIC PANEL
ANION GAP: 9 (ref 5–15)
BUN: 39 mg/dL — AB (ref 6–23)
CHLORIDE: 104 meq/L (ref 96–112)
CO2: 26 mmol/L (ref 19–32)
Calcium: 9.5 mg/dL (ref 8.4–10.5)
Creatinine, Ser: 1.31 mg/dL — ABNORMAL HIGH (ref 0.50–1.10)
GFR calc Af Amer: 49 mL/min — ABNORMAL LOW (ref >90)
GFR, EST NON AFRICAN AMERICAN: 43 mL/min — AB (ref >90)
GLUCOSE: 93 mg/dL (ref 70–99)
Potassium: 4.6 mmol/L (ref 3.5–5.1)
Sodium: 139 mmol/L (ref 135–145)

## 2014-03-16 MED ORDER — SODIUM CHLORIDE 0.9 % IJ SOLN: 10.0000 mL | INTRAMUSCULAR | Status: DC | PRN

## 2014-03-16 MED ORDER — SODIUM CHLORIDE 0.9 % IJ SOLN
10.0000 mL | Freq: Two times a day (BID) | INTRAMUSCULAR | Status: DC
  Administered 2014-03-16 – 2014-03-21 (×9): 10 mL
  Administered 2014-03-21: 20 mL
  Administered 2014-03-22 – 2014-03-25 (×5): 10 mL

## 2014-03-16 NOTE — Clinical Social Work Psychosocial (Signed)
     Clinical Social Work Department BRIEF PSYCHOSOCIAL ASSESSMENT 03/15/2014  Patient:  [name], [name]     Account Number:  [id number]     Admit date:  03/11/2014  Clinical Social Worker:  Elam Dutch  Date/Time:  03/15/2014 12:35 PM  Referred by:  Care Management  Date Referred:  03/14/2014 Referred for  SNF Placement   Other Referral:   Interview type:  Other - See comment Other interview type:   Patient and 2 sisters    PSYCHOSOCIAL DATA Living Status:  FAMILY Admitted from facility:   Level of care:   Primary support name:  Ashlee Player  [phone] Primary support relationship to patient:  CHILD, ADULT Degree of support available:   Strong support    *Granddaughter  age 67 (in school) lives with pateint  Sister:  Jorene Guest  [phone]    CURRENT CONCERNS Current Concerns  Post-Acute Placement   Other Concerns:    SOCIAL WORK ASSESSMENT / PLAN CSW was referred by [facility] to assess patient for possible SNF placement. She has been momved from [facility] to [facility].  CSW met with pateint along with her 2 sisters this afternoon to discuss possible d/c options.  When SNF option was mentioned, patient adamantly responded with a firm "NO". She stated "no way am I going to go to a nursing home- no matter what."  She currently lives at home with  her 87 year old granddaughter who is normally in school. She states that she has other family including her son' and his girlfriend and feels that the girlfriend can stay with her at home if needed.  CSW discussed with patient that she would benefit from having someone with her at home at least until she was stronger; her sisters were in the room but did not respond to this or offer any options/solutions.  MD has made mention of a discussion re: possible Hospice referral with patient and she mentioned it during the  visit. She would accept Hospice services if indicated but feels that it is too premature at this time to know.  CSW discussed home  hospice vs residential hospice. Patient and sisters stated that their mother had been at [facility] prior to her death.  CSW will dscuss on 05/14/2022 with RNCM and monitor/assist as needed.   Assessment/plan status:  Psychosocial Support/Ongoing Assessment of Needs Other assessment/ plan:   Information/referral to community resources:   Home Hospice and residential Hospice discussed.  SNF bed list declined by patient.  Refuses SNF.    PATIENTS/FAMILYS RESPONSE TO PLAN OF CARE: 64 year old female noted to be sitting up in a chair in her room on 2H.  She stated that she was feeling a little better but remains weak.  Patient is alert and oriented; she stated that MD has mentioned the possible future need for Hospice and she would consider this option. Adamantly refuses SNF despite being aware of weakness and limited home support.  RNCM and CSW can follow together to determine course of d/c needs.

## 2014-03-16 NOTE — Progress Notes (Signed)
Patient ID: [name], female   DOB: Apr 21, 1950, 64 y.o.   MRN: [id number]   SUBJECTIVE: [name] is a 64 yo female with a history of DM2, HLD, HTN, pHTN, GERD, gastroparesis, scleroderma, DJD and diastolic HF.   Of note she has been on several PAH targeted therapies and coumadin over last 9 years, but has not been reliably on therapy due to noncompliance and inability to have labs performed. She has had best response to Tyvaso (+ bosentan) with an improvement in 6 minute walk and in PASP from 102 mmHg (7/09) to 42 mmHg (9/10). After stopping Tyvaso her PASP rose to 80's by TTE 6/13 and 11/14. Surprisingly, she has never required supplemental O2. Macitentan was started in 11/14. She developed nausea and diarrhea and macitentan was stopped early 3/15. Most recently, she had been on Letairis and Adcirca (followed by Dr. Lamonte Sakai).   Admitted 10/14-10/18/15 for A/C RHF, volume overload and syncope. Diuresed with IV lasix and milrinone a total of 26 lbs. Discharge weight was 156 lbs and started on lasix 40 mg BID.   She was admitted again on 12/29 with dyspnea and chest pain.  BP was low, and Lasix and Adcirca were both initially held.  Echo showed EF 60-65% with moderately dilated and dysfunctional RV, PASP 111 mmHg with moderate TR.  There was a large pericardial effusion without definite tamponade.  Presentation was suspected to be due to progressive PAH with secondary RV failure rather than tamponade.  No pericardiocentesis yet. She was started on milrinone gtt and transferred to [facility] for diuresis. Shorter-acting sildenafil was started instead of Adcirca.   Yesterday, she did very well on Lasix 40 mg IV bid, weight down considerably.  She says her breathing is better.    Filed Vitals:   03/16/14 0500 03/16/14 0600 03/16/14 0700 03/16/14 0800  BP: 86/40 90/51 119/55 91/43  Pulse: 72 73 72 73  Temp:   98 F (36.7 C)   TempSrc:   Oral   Resp: _0 Height:      Weight: 151 lb 10.8 oz (68.8  kg)     SpO2: 100% 94% 100% 100%    Intake/Output Summary (Last 24 hours) at 03/16/14 0910 Last data filed at 03/16/14 0800  Gross per 24 hour  Intake  591.1 ml  Output   3900 ml  Net -3308.9 ml    LABS: Basic Metabolic Panel:  Recent Labs  03/15/14 0300 03/16/14 0218  NA 141 139  K 4.6 4.6  CL 107 104  CO2 22 26  GLUCOSE 83 93  BUN 47* 39*  CREATININE 1.35* 1.31*  CALCIUM 9.2 9.5  MG 1.7  --   PHOS 2.9  --    Liver Function Tests:  Recent Labs  03/14/14 0400  AST 28  ALT 10  ALKPHOS 107  BILITOT 0.8  PROT 7.5  ALBUMIN 3.3*   No results for input(s): LIPASE, AMYLASE in the last 72 hours. CBC:  Recent Labs  03/14/14 0400 03/15/14 0300  WBC 4.4 4.5  HGB 8.1* 8.4*  HCT 24.4* 25.1*  MCV 81.9 81.8  PLT 123* 117*   Cardiac Enzymes: No results for input(s): CKTOTAL, CKMB, CKMBINDEX, TROPONINI in the last 72 hours. BNP: Invalid input(s): POCBNP D-Dimer: No results for input(s): DDIMER in the last 72 hours. Hemoglobin A1C: No results for input(s): HGBA1C in the last 72 hours. Fasting Lipid Panel: No results for input(s): CHOL, HDL, LDLCALC, TRIG, CHOLHDL, LDLDIRECT in the last 72  hours. Thyroid Function Tests: No results for input(s): TSH, T4TOTAL, T3FREE, THYROIDAB in the last 72 hours.  Invalid input(s): FREET3 Anemia Panel: No results for input(s): VITAMINB12, FOLATE, FERRITIN, TIBC, IRON, RETICCTPCT in the last 72 hours.  RADIOLOGY: Ct Abdomen Pelvis Wo Contrast  03/11/2014   CLINICAL DATA:  Initial evaluation for shortness of breath, abdominal pain.  EXAM: CT ABDOMEN AND PELVIS WITHOUT CONTRAST  TECHNIQUE: Multidetector CT imaging of the abdomen and pelvis was performed following the standard protocol without IV contrast.  COMPARISON:  None.  FINDINGS: Bilateral pleural effusions are partially visualized, right greater than left. There is associated bibasilar atelectasis. Moderate to large pericardial effusion is partially visualized.  Scattered  cystic lesions within the right hepatic lobe are stable. Liver is otherwise unremarkable. Gallbladder within normal limits. No biliary dilatation. Spleen, adrenal glands, and pancreas demonstrate a normal unenhanced appearance.  Kidneys are equal in size without evidence of nephrolithiasis or hydronephrosis.  Distal esophagus is somewhat patulous. Stomach within normal limits. No evidence for bowel obstruction. No abnormal wall thickening or inflammatory changes seen about the bowels.  Bladder within normal limits. Uterus and ovaries are not visualized.  Small volume free fluid seen adjacent to the liver.  No free air.  Retroperitoneal adenopathy again seen, similar to prior. Again, the most prominent node is seen in the left periaortic region and 1.5 cm in short axis (series 2, image 50, previously 1.6 cm). Left iliac node measures up to 1.3 cm in short axis (series 2, image 73). There are additional mildly prominent but not enlarged iliac nodes bilaterally. No pathologically enlarged inguinal lymph nodes identified. Overall, the degree of adenopathy is stable to slightly decreased from prior study.  No acute osseous abnormality. No worrisome lytic or blastic osseous lesions.  Mild diffuse anasarca.  IMPRESSION: 1. Moderate pericardial effusion with small bilateral pleural effusions, right greater than left, and small volume ascites. 2. Stable to slightly decreased size of retroperitoneal and iliac adenopathy as above. These nodes are indeterminate. Continued attention on followup is recommended. Additionally, correlation with PET-CT could be considered for further evaluation. 3. No other acute intra-abdominal pelvic process identified.   Electronically Signed   By: Jeannine Boga M.D.   On: 03/11/2014 23:59   Dg Chest 2 View  03/13/2014   CLINICAL DATA:  Shortness of breath.  EXAM: CHEST  2 VIEW  COMPARISON:  CT 02/15/2012.  Chest x-ray 12/26/2013.  FINDINGS: Mediastinum hilar structures are  unremarkable. Persistent severe cardiomegaly. Pulmonary venous congestion. Diffuse from interstitial prominence and mild pleural effusions previous present consistent with congestive heart failure. A component of the severe cardiomegaly may be related to previously identified pericardial effusion. No pneumothorax. No acute osseus abnormality.  IMPRESSION: 1. Severe cardiomegaly with congestive heart failure. Bilateral interstitial edema and small pleural effusions are present. 2. A component of the cardiomegaly may be related to previously identified pericardial effusion.   Electronically Signed   By: [place]ristow   On: 03/13/2014 07:57   Dg Chest 2 View  03/11/2014   CLINICAL DATA:  Chest pain and shortness of breath.  EXAM: CHEST  2 VIEW  COMPARISON:  Chest radiograph 12/26/2013  FINDINGS: Marked cardiomegaly. Bilateral perihilar and diffuse interstitial pulmonary opacities. No definite pleural effusion or pneumothorax. Mid thoracic spine degenerative change. Prominent small bowel loops, incompletely visualized.  IMPRESSION: Cardiomegaly and mild interstitial pulmonary edema.  Prominent upper abdominal small bowel loops, incompletely visualized. Consider correlation with dedicated abdominal radiography.   Electronically Signed   By: Dian Situ  Rosana Hoes M.D.   On: 03/11/2014 21:15   Nm Pulmonary Perf And Vent  03/12/2014   CLINICAL DATA:  64 year old female with acute shortness of breath and chest pain. Initial encounter.  EXAM: NUCLEAR MEDICINE PERFUSION LUNG SCAN  TECHNIQUE: Perfusion images were obtained in multiple projections after intravenous injection of radiopharmaceutical.  The patient was un able to perform ventilation.  After only two image acquisitions, the patient did not want to continue with the perfusion study.  RADIOPHARMACEUTICALS:  6 mCi Tc-40mMAA  COMPARISON:  03/11/2014 abdominal CT and chest radiograph.  FINDINGS: No perfusion defects are identified.  Cardiomegaly and pericardial effusion  as seen on CT attenuates left lower lung activity.  IMPRESSION: No perfusion defects identified -no evidence of pulmonary emboli.   Electronically Signed   By: JHassan RowanM.D.   On: 03/12/2014 19:04   Dg Knee Complete 4 Views Left  02/27/2014   CLINICAL DATA:  Trauma yesterday with pain and swelling. History of left knee arthroplasty.  EXAM: LEFT KNEE - COMPLETE 4+ VIEW  COMPARISON:  12/26/2010  FINDINGS: Long-stem total knee arthroplasty. No acute fracture or dislocation. Cannot exclude small suprapatellar joint effusion. No acute hardware complication.  IMPRESSION: No acute osseous abnormality. Total knee arthroplasty. Cannot exclude small suprapatellar joint effusion.   Electronically Signed   By: KAbigail MiyamotoM.D.   On: 02/27/2014 15:18   Dg Abd Portable 1v  03/11/2014   CLINICAL DATA:  One day history of abdominal pain  EXAM: PORTABLE ABDOMEN - 1 VIEW  COMPARISON:  June 07, 2013  FINDINGS: There is moderate stool in the colon. Bowel gas pattern is unremarkable. No obstruction or free air is seen on this supine examination. There are small phleboliths in the pelvis. Liver appears prominent but stable.  IMPRESSION: Bowel gas pattern unremarkable.  Liver prominent but stable.   Electronically Signed   By: WLowella GripM.D.   On: 03/11/2014 21:45    PHYSICAL EXAM General: NAD Neck: JVP to jaw, no thyromegaly or thyroid nodule.  Lungs: Crackles at bases bilaterally.  CV: RV heave.  Heart regular S1/S2, no S3/S4, 2/6 HSM LLSB.  No peripheral edema.   Abdomen: Soft, nontender, no hepatosplenomegaly, no distention.  Neurologic: Alert and oriented x 3.  Psych: Normal affect. Extremities: No clubbing or cyanosis.   TELEMETRY: Reviewed telemetry pt in NSR  ASSESSMENT AND PLAN: 64yo with history of PAH and scleroderma was admitted with right-sided heart failure and large pericardial effusion.  Treatment has been limited in past by multiple med intolerances and compliance issues.  1.  Right-sided heart failure: RV moderately dilated/dysfunctional on echo with volume overload.  She is diuresing well with milrinone for RV support/lowering of PA pressures.   - Would continue Lasix 40 mg IV bid + milrinone today.  2. PAH:  Severe PAH, PASP 111 mmHg by echo this admission, higher than in the past.  She had been on Letairis and Adcirca at home.  Adcirca held with low BP and sildenafil begun.  She is tolerating this so far.   - Continue milrinone for RV support and lowering of PA pressure as we diurese her.   - Continue Letairis and sildenafil for now.  Agree that she would be a poor IV epoprostenol candidate with med intolerances and compliance issues.  She did not like taking Tyvaso in the past but seemed to have the best response to this agent.  Orenitram (po treprostinil) would be a consideration but trials in combination with PDE-5  inhibitors and endothelin antagonists did not show additive benefit from orenitram. She is going to think about Tyvaso.  After she has been adequately diuresed, could consider restarting this while hospitalized.   - She will need RHC once she has been adequately diuresed.  3. Pericardial effusion: Large pericardial effusion without definite tamponade, but difficult to tell hemodynamic effect given severe pulmonary hypertension with RV failure.  Often see pericardial effusion with severe pulmonary hypertension/RV failure.  Will diurese for now, repeat echo in a few days to reassess the effusion.  Would like to avoid pericardiocentesis if possible given higher risk with severe PAH.  4. Needs to ambulate => PT, cardiac rehab.   [name] 03/16/2014 9:24 AM

## 2014-03-16 NOTE — Progress Notes (Signed)
Peripherally Inserted Central Catheter/Midline Placement  The IV Nurse has discussed with the patient and/or persons authorized to consent for the patient, the purpose of this procedure and the potential benefits and risks involved with this procedure.  The benefits include less needle sticks, lab draws from the catheter and patient may be discharged home with the catheter.  Risks include, but not limited to, infection, bleeding, blood clot (thrombus formation), and puncture of an artery; nerve damage and irregular heat beat.  Alternatives to this procedure were also discussed.  PICC/Midline Placement Documentation  PICC Triple Lumen 82/88/33 PICC Right Basilic 41 cm 0 cm (Active)  Indication for Insertion or Continuance of Line Vasoactive infusions 03/16/2014  4:02 PM  Exposed Catheter (cm) 1 cm 03/16/2014  4:02 PM  Site Assessment Clean;Dry;Intact 03/16/2014  4:02 PM  Lumen #1 Status Flushed;Saline locked;Blood return noted 03/16/2014  4:02 PM  Lumen #2 Status Flushed;Saline locked;Blood return noted 03/16/2014  4:02 PM  Lumen #3 Status Flushed;Saline locked 03/16/2014  4:02 PM  Dressing Change Due 03/23/14 03/16/2014  4:02 PM       Gordan Payment 03/16/2014, 4:05 PM

## 2014-03-16 NOTE — Progress Notes (Signed)
PULMONARY / CRITICAL CARE MEDICINE   Name: [name] MRN: [id number] DOB: Feb 04, 1951    ADMISSION DATE:  03/11/2014 CONSULTATION DATE:  03/13/2014   REFERRING MD :  Graciella Freer and cards  CHIEF COMPLAINT:  Class 4 dyspnea wih new pericardial effusion in patient with PAH due to scleroderma   Brief Summary:    64yo female with hx scleroderma and severe secondary pulm HTN with typical PASP 90-154mHg, long hx noncompliance for multiple reasons.   At some point has been on Tyvaso, ventavis, adcirca, bosentan, coumadin and macitentan but was either non compliant or intolerant.  Most recently started on letairis (10/15).  Has had progressive worsening of pulmonary HTN since 2010 but overall has remained fairly well compensated. She presented 12/28 with acute onset of 3-4 days of increasing dyspnea (now class 4) and worsening orthopnea. Echo showed PASP 1170mg and new mod/large pericardial effusion.  PE r/o by VQ.  PCCM consulted 12/30.    Serial Echo findings - PASP 2010   - 42  - PASP 2013 - 63 - PASP June 2013 - 85 with moderate RV dilatation - PASP on TTE 01/24/13 > stable at 8759m - RV dilated severely - PASP 06/08/13 - 59m37m- small pericardial effusion - RV severely dilated, moderate reduction in EF - PASP 03/12/14 - 113mm16m- large free flowing effusion (VQ scan no PE) - RV moderately dilated  SIGNIFICANT EVENTS: 03/11/2014 - admit 03/13/2014 - PCCM consult   SUBJECTIVE/OVERNIGHT/INTERVAL HX 03/16/14: Remains on  milrinone gtt. Awaiting cath.   Up in chair, ~9L neg I/O bal since admit , wt down 16lbs    VITAL SIGNS: Temp:  [97.8 F (36.6 C)-98 F (36.7 C)] 98 F (36.7 C) (01/02 0700) Pulse Rate:  [68-123] 72 (01/02 0900) Resp:  [11-20] 16 (01/02 0900) BP: (86-124)/(40-61) 114/46 mmHg (01/02 0900) SpO2:  [94 %-100 %] 95 % (01/02 0900) Weight:  [68.8 kg (151 lb 10.8 oz)] 68.8 kg (151 lb 10.8 oz) (01/02 0500) HEMODYNAMICS:   VENTILATOR SETTINGS:   INTAKE /  OUTPUT:  Intake/Output Summary (Last 24 hours) at 03/16/14 1026 Last data filed at 03/16/14 0900  Gross per 24 hour  Intake  586.1 ml  Output   4900 ml  Net -4313.9 ml    PHYSICAL EXAMINATION: General:  Frail looking female. Sitting in chair by side of bed. No significant distress Neuro:  AXOX3. Appropriate, MAE, weak HEENT:  Supple NEck, Elevated JVP + Cardiovascular:  Loud P2+, RRR + Lungs:  resps even, non labored at rest, CTA bilaterally but overall diminished Abdomen:  Soft BS +  Musculoskeletal:  Trace BLE edema  LABS: PULMONARY  Recent Labs Lab 03/13/14 1445  PHART 7.407  PCO2ART 37.6  PO2ART 98.3  HCO3 23.2  TCO2 24.3  O2SAT 98.2    CBC  Recent Labs Lab 03/13/14 0326 03/14/14 0400 03/15/14 0300  HGB 8.7* 8.1* 8.4*  HCT 27.1* 24.4* 25.1*  WBC 4.4 4.4 4.5  PLT 125* 123* 117*    COAGULATION No results for input(s): INR in the last 168 hours.  CARDIAC    Recent Labs Lab 03/11/14 2355 03/12/14 0638  TROPONINI <0.03 <0.03   No results for input(s): PROBNP in the last 168 hours.   CHEMISTRY  Recent Labs Lab 03/11/14 1927 03/12/14 0638 03/13/14 0326 03/15/14 0300 03/16/14 0218  NA 142 140 141 141 139  K 4.0 3.8 4.4 4.6 4.6  CL 110 110 108 107 104  CO2 _0 GLUCOSE  156* 92 126* 83 93  BUN 58* 56* 55* 47* 39*  CREATININE 1.63* 1.47* 1.50* 1.35* 1.31*  CALCIUM 9.5 9.5 9.5 9.2 9.5  MG  --   --   --  1.7  --   PHOS  --   --   --  2.9  --    Estimated Creatinine Clearance: 42.4 mL/min (by C-G formula based on Cr of 1.31).   LIVER  Recent Labs Lab 03/14/14 0400  AST 28  ALT 10  ALKPHOS 107  BILITOT 0.8  PROT 7.5  ALBUMIN 3.3*     INFECTIOUS  Recent Labs Lab 03/13/14 1637  LATICACIDVEN 1.3     ENDOCRINE CBG (last 3)  No results for input(s): GLUCAP in the last 72 hours.       IMAGING x48h No results found.     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  Strong history of poor compliance / intolerance to rx.  Suspect increased dyspnea and hypotension are r/t overall worsening of PAH, c/b poor compliance with new pericardial effusion contributing to dyspnea as well.   Hypotension large new pericardial effusion. Likely reflects her overall total body volume overload, 3rd spacing. Unclear that it is having hemodynamic effects although possible given its size Pleural effusion.    Discussion:  > Her PASP has been profoundly elevated for months (years actually) without much dyspnea. Certainly most likely scenario is that the [facility] has progressed and she has decompensated sx. Must also consider acute events with more precipitous decline. No evidence PE on V/q scan 12/29, CXR looks like edema and cardiomegaly; doubt acute or chronic change due to scleroderma. Pericardial effusion may be playing a role, difficult to say based on only TTE findings.   P:   -Continue letairis +  Revatio  (PDE inhibitor with shorter half life than adcirca) for now although she has had side effects with this in the past -Consider  diuresis as BP and renal fxn can tolerate - Continue milrinone per catds -  - awiat Right heart cath   - Don't believe she is a great candidate for IV prostanoids because she has proven to have poor compliance in the past and in fact 03/15/14 she declined it  -check cxr in am     Rosalyn Archambault NP-C  [facility]  [phone]    03/16/2014 10:26 AM

## 2014-03-16 NOTE — Progress Notes (Signed)
CARDIAC REHAB PHASE I   PRE:  Rate/Rhythm: 84 SR  BP:  Supine:   Sitting: 91/39  Standing:    SaO2: 98% 2L  MODE:  Ambulation: 350 ft   POST:  Rate/Rhythm: 90  BP:  Supine:   Sitting: 110/44  Standing:    SaO2: 100%2L 1035-1120 Pt walked 350 ft on 2L with gait belt use, rolling walker and asst x1 with slow steady gait. Tolerated well for first walk. Pt stated legs weak but breathing felt OK. To recliner after walk and left on 2L. Call bell in reach. Did not have to stop and rest.   Graylon Good, RN BSN  03/16/2014 11:17 AM

## 2014-03-17 ENCOUNTER — Inpatient Hospital Stay (HOSPITAL_COMMUNITY): Payer: Medicare Other

## 2014-03-17 LAB — CBC
HCT: 29 % — ABNORMAL LOW (ref 36.0–46.0)
Hemoglobin: 9.7 g/dL — ABNORMAL LOW (ref 12.0–15.0)
MCH: 26.6 pg (ref 26.0–34.0)
MCHC: 33.4 g/dL (ref 30.0–36.0)
MCV: 79.7 fL (ref 78.0–100.0)
Platelets: 161 10*3/uL (ref 150–400)
RBC: 3.64 MIL/uL — AB (ref 3.87–5.11)
RDW: 14.4 % (ref 11.5–15.5)
WBC: 5.7 10*3/uL (ref 4.0–10.5)

## 2014-03-17 LAB — CARBOXYHEMOGLOBIN
CARBOXYHEMOGLOBIN: 1.7 % — AB (ref 0.5–1.5)
Methemoglobin: 1.3 % (ref 0.0–1.5)
O2 Saturation: 82.4 %
Total hemoglobin: 9.4 g/dL — ABNORMAL LOW (ref 12.0–16.0)

## 2014-03-17 LAB — BASIC METABOLIC PANEL
Anion gap: 7 (ref 5–15)
BUN: 44 mg/dL — ABNORMAL HIGH (ref 6–23)
CO2: 29 mmol/L (ref 19–32)
Calcium: 9.5 mg/dL (ref 8.4–10.5)
Chloride: 101 mEq/L (ref 96–112)
Creatinine, Ser: 1.37 mg/dL — ABNORMAL HIGH (ref 0.50–1.10)
GFR calc non Af Amer: 40 mL/min — ABNORMAL LOW (ref >90)
GFR, EST AFRICAN AMERICAN: 47 mL/min — AB (ref >90)
GLUCOSE: 113 mg/dL — AB (ref 70–99)
Potassium: 4 mmol/L (ref 3.5–5.1)
SODIUM: 137 mmol/L (ref 135–145)

## 2014-03-17 LAB — PROTIME-INR
INR: 1.19 (ref 0.00–1.49)
PROTHROMBIN TIME: 15.2 s (ref 11.6–15.2)

## 2014-03-17 MED ORDER — SODIUM CHLORIDE 0.9 % IJ SOLN
3.0000 mL | Freq: Two times a day (BID) | INTRAMUSCULAR | Status: DC
  Administered 2014-03-18 – 2014-03-19 (×3): 3 mL via INTRAVENOUS

## 2014-03-17 MED ORDER — AMBRISENTAN 5 MG PO TABS
10.0000 mg | ORAL_TABLET | Freq: Every day | ORAL | Status: DC
  Administered 2014-03-17 – 2014-03-25 (×8): 10 mg via ORAL
  Filled 2014-03-17 (×6): qty 2

## 2014-03-17 MED ORDER — ASPIRIN 81 MG PO CHEW
81.0000 mg | CHEWABLE_TABLET | ORAL | Status: AC
  Administered 2014-03-18: 81 mg via ORAL
  Filled 2014-03-17: qty 1

## 2014-03-17 MED ORDER — SODIUM CHLORIDE 0.9 % IV SOLN: 250.0000 mL | INTRAVENOUS | Status: DC | PRN

## 2014-03-17 MED ORDER — FUROSEMIDE 40 MG PO TABS
40.0000 mg | ORAL_TABLET | Freq: Two times a day (BID) | ORAL | Status: DC
  Administered 2014-03-17 (×2): 40 mg via ORAL
  Filled 2014-03-17 (×4): qty 1

## 2014-03-17 MED ORDER — SODIUM CHLORIDE 0.9 % IJ SOLN: 3.0000 mL | INTRAMUSCULAR | Status: DC | PRN

## 2014-03-17 NOTE — Progress Notes (Signed)
Patient ID: SWAYZE KOZUCH, female   DOB: September 14, 1950, 64 y.o.   MRN: [id number]   SUBJECTIVE: Ms. Raczynski is a 64 yo female with a history of DM2, HLD, HTN, pHTN, GERD, gastroparesis, scleroderma, DJD and diastolic HF.   Of note she has been on several PAH targeted therapies and coumadin over last 9 years, but has not been reliably on therapy due to noncompliance and inability to have labs performed. She has had best response to Tyvaso (+ bosentan) with an improvement in 6 minute walk and in PASP from 102 mmHg (7/09) to 42 mmHg (9/10). After stopping Tyvaso her PASP rose to 80's by TTE 6/13 and 11/14. Surprisingly, she has never required supplemental O2. Macitentan was started in 11/14. She developed nausea and diarrhea and macitentan was stopped early 3/15. Most recently, she had been on Letairis and Adcirca (followed by Dr. Lamonte Sakai).   Admitted 10/14-10/18/15 for A/C RHF, volume overload and syncope. Diuresed with IV lasix and milrinone a total of 26 lbs. Discharge weight was 156 lbs and started on lasix 40 mg BID.   She was admitted again on 12/29 with dyspnea and chest pain.  BP was low, and Lasix and Adcirca were both initially held.  Echo showed EF 60-65% with moderately dilated and dysfunctional RV, PASP 111 mmHg with moderate TR.  There was a large pericardial effusion without definite tamponade.  Presentation was suspected to be due to progressive PAH with secondary RV failure rather than tamponade.  No pericardiocentesis yet. She was started on milrinone gtt and transferred to [facility] for diuresis. Shorter-acting sildenafil was started instead of Adcirca.   She continues to diurese well on IV Lasix, down another 5 lbs.  CVP 8 this morning.  Mild rise in creatinine.   Scheduled Meds: . ambrisentan  5 mg Oral Daily  . aspirin EC  81 mg Oral Daily  . fenofibrate  54 mg Oral Daily  . ferrous sulfate  325 mg Oral Q breakfast  . FLUoxetine  20 mg Oral q morning - 10a  . furosemide  40 mg Intravenous Q12H   . heparin subcutaneous  5,000 Units Subcutaneous 3 times per day  . pantoprazole  40 mg Oral Daily  . pregabalin  150 mg Oral TID  . sildenafil  20 mg Oral TID  . sodium chloride  10-40 mL Intracatheter Q12H  . sodium chloride  3 mL Intravenous Q12H   Continuous Infusions: . sodium chloride 5 mL/hr at 03/16/14 2000  . milrinone 0.25 mcg/kg/min (03/17/14 0550)   PRN Meds:.HYDROcodone-acetaminophen, sodium chloride   Filed Vitals:   03/17/14 0400 03/17/14 0500 03/17/14 0600 03/17/14 0700  BP: 92/44 103/56 105/44 97/51  Pulse: 75 83 85 85  Temp: 97.5 F (36.4 C)     TempSrc: Oral     Resp: _0 Height:      Weight:  146 lb 9.7 oz (66.5 kg)    SpO2: 97% 100% 99% 100%    Intake/Output Summary (Last 24 hours) at 03/17/14 0818 Last data filed at 03/17/14 0600  Gross per 24 hour  Intake 1130.4 ml  Output   4575 ml  Net -3444.6 ml    LABS: Basic Metabolic Panel:  Recent Labs  03/15/14 0300 03/16/14 0218 03/17/14 0515  NA 141 139 137  K 4.6 4.6 4.0  CL 107 104 101  CO2 _1 GLUCOSE 83 93 113*  BUN 47* 39* 44*  CREATININE 1.35* 1.31* 1.37*  CALCIUM 9.2 9.5  9.5  MG 1.7  --   --   PHOS 2.9  --   --    Liver Function Tests: No results for input(s): AST, ALT, ALKPHOS, BILITOT, PROT, ALBUMIN in the last 72 hours. No results for input(s): LIPASE, AMYLASE in the last 72 hours. CBC:  Recent Labs  03/15/14 0300 03/17/14 0515  WBC 4.5 5.7  HGB 8.4* 9.7*  HCT 25.1* 29.0*  MCV 81.8 79.7  PLT 117* 161   Cardiac Enzymes: No results for input(s): CKTOTAL, CKMB, CKMBINDEX, TROPONINI in the last 72 hours. BNP: Invalid input(s): POCBNP D-Dimer: No results for input(s): DDIMER in the last 72 hours. Hemoglobin A1C: No results for input(s): HGBA1C in the last 72 hours. Fasting Lipid Panel: No results for input(s): CHOL, HDL, LDLCALC, TRIG, CHOLHDL, LDLDIRECT in the last 72 hours. Thyroid Function Tests: No results for input(s): TSH, T4TOTAL, T3FREE,  THYROIDAB in the last 72 hours.  Invalid input(s): FREET3 Anemia Panel: No results for input(s): VITAMINB12, FOLATE, FERRITIN, TIBC, IRON, RETICCTPCT in the last 72 hours.  RADIOLOGY: Ct Abdomen Pelvis Wo Contrast  03/11/2014   CLINICAL DATA:  Initial evaluation for shortness of breath, abdominal pain.  EXAM: CT ABDOMEN AND PELVIS WITHOUT CONTRAST  TECHNIQUE: Multidetector CT imaging of the abdomen and pelvis was performed following the standard protocol without IV contrast.  COMPARISON:  None.  FINDINGS: Bilateral pleural effusions are partially visualized, right greater than left. There is associated bibasilar atelectasis. Moderate to large pericardial effusion is partially visualized.  Scattered cystic lesions within the right hepatic lobe are stable. Liver is otherwise unremarkable. Gallbladder within normal limits. No biliary dilatation. Spleen, adrenal glands, and pancreas demonstrate a normal unenhanced appearance.  Kidneys are equal in size without evidence of nephrolithiasis or hydronephrosis.  Distal esophagus is somewhat patulous. Stomach within normal limits. No evidence for bowel obstruction. No abnormal wall thickening or inflammatory changes seen about the bowels.  Bladder within normal limits. Uterus and ovaries are not visualized.  Small volume free fluid seen adjacent to the liver.  No free air.  Retroperitoneal adenopathy again seen, similar to prior. Again, the most prominent node is seen in the left periaortic region and 1.5 cm in short axis (series 2, image 50, previously 1.6 cm). Left iliac node measures up to 1.3 cm in short axis (series 2, image 73). There are additional mildly prominent but not enlarged iliac nodes bilaterally. No pathologically enlarged inguinal lymph nodes identified. Overall, the degree of adenopathy is stable to slightly decreased from prior study.  No acute osseous abnormality. No worrisome lytic or blastic osseous lesions.  Mild diffuse anasarca.  IMPRESSION:  1. Moderate pericardial effusion with small bilateral pleural effusions, right greater than left, and small volume ascites. 2. Stable to slightly decreased size of retroperitoneal and iliac adenopathy as above. These nodes are indeterminate. Continued attention on followup is recommended. Additionally, correlation with PET-CT could be considered for further evaluation. 3. No other acute intra-abdominal pelvic process identified.   Electronically Signed   By: Jeannine Boga M.D.   On: 03/11/2014 23:59   Dg Chest 2 View  03/13/2014   CLINICAL DATA:  Shortness of breath.  EXAM: CHEST  2 VIEW  COMPARISON:  CT 02/15/2012.  Chest x-ray 12/26/2013.  FINDINGS: Mediastinum hilar structures are unremarkable. Persistent severe cardiomegaly. Pulmonary venous congestion. Diffuse from interstitial prominence and mild pleural effusions previous present consistent with congestive heart failure. A component of the severe cardiomegaly may be related to previously identified pericardial effusion. No pneumothorax. No  acute osseus abnormality.  IMPRESSION: 1. Severe cardiomegaly with congestive heart failure. Bilateral interstitial edema and small pleural effusions are present. 2. A component of the cardiomegaly may be related to previously identified pericardial effusion.   Electronically Signed   By: Filley   On: 03/13/2014 07:57   Dg Chest 2 View  03/11/2014   CLINICAL DATA:  Chest pain and shortness of breath.  EXAM: CHEST  2 VIEW  COMPARISON:  Chest radiograph 12/26/2013  FINDINGS: Marked cardiomegaly. Bilateral perihilar and diffuse interstitial pulmonary opacities. No definite pleural effusion or pneumothorax. Mid thoracic spine degenerative change. Prominent small bowel loops, incompletely visualized.  IMPRESSION: Cardiomegaly and mild interstitial pulmonary edema.  Prominent upper abdominal small bowel loops, incompletely visualized. Consider correlation with dedicated abdominal radiography.    Electronically Signed   By: Lovey Newcomer M.D.   On: 03/11/2014 21:15   Nm Pulmonary Perf And Vent  03/12/2014   CLINICAL DATA:  64 year old female with acute shortness of breath and chest pain. Initial encounter.  EXAM: NUCLEAR MEDICINE PERFUSION LUNG SCAN  TECHNIQUE: Perfusion images were obtained in multiple projections after intravenous injection of radiopharmaceutical.  The patient was un able to perform ventilation.  After only two image acquisitions, the patient did not want to continue with the perfusion study.  RADIOPHARMACEUTICALS:  6 mCi Tc-71mMAA  COMPARISON:  03/11/2014 abdominal CT and chest radiograph.  FINDINGS: No perfusion defects are identified.  Cardiomegaly and pericardial effusion as seen on CT attenuates left lower lung activity.  IMPRESSION: No perfusion defects identified -no evidence of pulmonary emboli.   Electronically Signed   By: JHassan RowanM.D.   On: 03/12/2014 19:04   Dg Chest Port 1 View  03/17/2014   CLINICAL DATA:  Acute respiratory failure.  EXAM: PORTABLE CHEST - 1 VIEW  COMPARISON:  03/13/2014  FINDINGS: Support apparatus: Right-sided PICC line tip low SVC.  New.  Cardiomediastinal silhouette: Moderate enlargement of the cardiopericardial silhouette, similar.  Pleura: No right-sided pleural effusion. The left pleural space is poorly evaluated secondary to the extent of cardiomegaly. Right hemidiaphragm elevation. No pneumothorax.  Lungs: Improvement in mild interstitial edema. Left greater than right bibasilar airspace disease. Grossly similar.  Other: None  IMPRESSION: Improvement in mild interstitial edema.  Cardiomegaly.  Cannot exclude pericardial effusion.  Bibasilar opacities. Favor atelectasis. At the left lung base, infection cannot be excluded.   Electronically Signed   By: KAbigail MiyamotoM.D.   On: 03/17/2014 07:53   Dg Knee Complete 4 Views Left  02/27/2014   CLINICAL DATA:  Trauma yesterday with pain and swelling. History of left knee arthroplasty.  EXAM: LEFT  KNEE - COMPLETE 4+ VIEW  COMPARISON:  12/26/2010  FINDINGS: Long-stem total knee arthroplasty. No acute fracture or dislocation. Cannot exclude small suprapatellar joint effusion. No acute hardware complication.  IMPRESSION: No acute osseous abnormality. Total knee arthroplasty. Cannot exclude small suprapatellar joint effusion.   Electronically Signed   By: KAbigail MiyamotoM.D.   On: 02/27/2014 15:18   Dg Abd Portable 1v  03/11/2014   CLINICAL DATA:  One day history of abdominal pain  EXAM: PORTABLE ABDOMEN - 1 VIEW  COMPARISON:  June 07, 2013  FINDINGS: There is moderate stool in the colon. Bowel gas pattern is unremarkable. No obstruction or free air is seen on this supine examination. There are small phleboliths in the pelvis. Liver appears prominent but stable.  IMPRESSION: Bowel gas pattern unremarkable.  Liver prominent but stable.   Electronically Signed  By: Lowella Grip M.D.   On: 03/11/2014 21:45    PHYSICAL EXAM General: NAD Neck: JVP not elevated, no thyromegaly or thyroid nodule.  Lungs: Crackles at bases bilaterally.  CV: RV heave.  Heart regular S1/S2, no S3/S4, 2/6 HSM LLSB.  No peripheral edema.   Abdomen: Soft, nontender, no hepatosplenomegaly, no distention.  Neurologic: Alert and oriented x 3.  Psych: Normal affect. Extremities: No clubbing or cyanosis.   TELEMETRY: Reviewed telemetry pt in NSR  ASSESSMENT AND PLAN: 64 yo with history of PAH and scleroderma was admitted with right-sided heart failure and large pericardial effusion.  Treatment has been limited in past by multiple med intolerances and compliance issues.  1. Right-sided heart failure: RV moderately dilated/dysfunctional on echo with volume overload.  She is diuresing well with milrinone for RV support/lowering of PA pressures.  CVP now down to 8 and no JVD on exam. - Stop IV Lasix, transition to 40 mg po bid.  - Continue milrinone for now.  2. PAH:  Severe PAH, PASP 111 mmHg by echo this admission,  higher than in the past.  She had been on Letairis and Adcirca at home.  Adcirca held with low BP and sildenafil begun.  She is tolerating this so far.   - Continue milrinone for RV support and lowering of PA pressure.    - Continue Letairis and sildenafil for now, can increase Letairis to 10 mg daily now that she appears diuresed.  Agree that she would be a poor IV epoprostenol candidate with med intolerances and compliance issues.  She did not like taking Tyvaso in the past but seemed to have had the best response to this agent.  Orenitram (po treprostinil) would be a consideration but trials in combination with PDE-5 inhibitors and endothelin antagonists did not show additive benefit from orenitram. She is going to think about Tyvaso.  She has been diuresed well at this point. Will plan RHC tomorrow, and if PCWP is not significantly elevated will consider addition of Tyvaso while in hospital. 3. Pericardial effusion: Large pericardial effusion without definite tamponade, but difficult to tell hemodynamic effect given severe pulmonary hypertension with RV failure.  Often see pericardial effusion with severe pulmonary hypertension/RV failure.  Will diurese for now, repeat echo in a few days to reassess the effusion.  Would like to avoid pericardiocentesis if possible given higher risk with severe PAH.  4. Needs to ambulate => PT, cardiac rehab.   [name] 03/17/2014 8:18 AM

## 2014-03-17 NOTE — Progress Notes (Signed)
PULMONARY / CRITICAL CARE MEDICINE   Name: [name] MRN: [id number] DOB: [birth year]    ADMISSION DATE:  03/11/2014 CONSULTATION DATE:  03/13/2014   REFERRING MD :  Graciella Freer and cards  CHIEF COMPLAINT:  Class 4 dyspnea wih new pericardial effusion in patient with PAH due to scleroderma   Brief Summary:    64yo female with hx scleroderma and severe secondary pulm HTN with typical PASP 90-121mHg, long hx noncompliance for multiple reasons.   At some point has been on Tyvaso, ventavis, adcirca, bosentan, coumadin and macitentan but was either non compliant or intolerant.  Most recently started on letairis (10/15).  Has had progressive worsening of pulmonary HTN since 2010 but overall has remained fairly well compensated. She presented 12/28 with acute onset of 3-4 days of increasing dyspnea (now class 4) and worsening orthopnea. Echo showed PASP 1126mg and new mod/large pericardial effusion.  PE r/o by VQ.  PCCM consulted 12/30.    Serial Echo findings - PASP 2010   - 42  - PASP 2013 - 63 - PASP June 2013 - 85 with moderate RV dilatation - PASP on TTE 01/24/13 > stable at 8791m - RV dilated severely - PASP 06/08/13 - 78m35m- small pericardial effusion - RV severely dilated, moderate reduction in EF - PASP 03/12/14 - 113mm65m- large free flowing effusion (VQ scan no PE) - RV moderately dilated  SIGNIFICANT EVENTS: 03/11/2014 - admit 03/13/2014 - PCCM consult   SUBJECTIVE/OVERNIGHT/INTERVAL HX 03/17/14: Remains on  milrinone gtt. Plan for RHC tomorrow  Up in chair, diuresing well w/ neg bal ,  wt down 22lbs    VITAL SIGNS: Temp:  [97.5 F (36.4 C)-98.2 F (36.8 C)] 97.9 F (36.6 C) (01/03 1200) Pulse Rate:  [72-89] 81 (01/03 1200) Resp:  [12-23] 13 (01/03 1200) BP: (84-124)/(40-65) 96/65 mmHg (01/03 1200) SpO2:  [96 %-100 %] 100 % (01/03 1200) Weight:  [66.5 kg (146 lb 9.7 oz)] 66.5 kg (146 lb 9.7 oz) (01/03 0500) HEMODYNAMICS: CVP:  [5 mmHg-8 mmHg] 8 mmHg VENTILATOR  SETTINGS:   INTAKE / OUTPUT:  Intake/Output Summary (Last 24 hours) at 03/17/14 1427 Last data filed at 03/17/14 1200  Gross per 24 hour  Intake  530.4 ml  Output   3675 ml  Net -3144.6 ml    PHYSICAL EXAMINATION: General:  Frail looking female. Sitting in chair by side of bed. No significant distress Neuro:  AXOX3. Appropriate, MAE, weak HEENT:  Supple neck , no JVD  Cardiovascular:  Loud P2+, RRR + Lungs:  resps even, non labored at rest, CTA bilaterally but overall diminished Abdomen:  Soft BS +  Musculoskeletal:  Trace to none  BLE edema  LABS: PULMONARY  Recent Labs Lab 03/13/14 1445 03/17/14 0529  PHART 7.407  --   PCO2ART 37.6  --   PO2ART 98.3  --   HCO3 23.2  --   TCO2 24.3  --   O2SAT 98.2 82.4    CBC  Recent Labs Lab 03/14/14 0400 03/15/14 0300 03/17/14 0515  HGB 8.1* 8.4* 9.7*  HCT 24.4* 25.1* 29.0*  WBC 4.4 4.5 5.7  PLT 123* 117* 161    COAGULATION  Recent Labs Lab 03/17/14 1130  INR 1.19    CARDIAC    Recent Labs Lab 03/11/14 2355 03/12/14 0638  TROPONINI <0.03 <0.03   No results for input(s): PROBNP in the last 168 hours.   CHEMISTRY  Recent Labs Lab 03/12/14 0638 60660/15 0326 03/15/14 0300 03/16/14 0218 78553/16 0515  NA 140 141 141 139 137  K 3.8 4.4 4.6 4.6 4.0  CL 110 108 107 104 101  CO2 _0 GLUCOSE 92 126* 83 93 113*  BUN 56* 55* 47* 39* 44*  CREATININE 1.47* 1.50* 1.35* 1.31* 1.37*  CALCIUM 9.5 9.5 9.2 9.5 9.5  MG  --   --  1.7  --   --   PHOS  --   --  2.9  --   --    Estimated Creatinine Clearance: 39.9 mL/min (by C-G formula based on Cr of 1.37).   LIVER  Recent Labs Lab 03/14/14 0400 03/17/14 1130  AST 28  --   ALT 10  --   ALKPHOS 107  --   BILITOT 0.8  --   PROT 7.5  --   ALBUMIN 3.3*  --   INR  --  1.19     INFECTIOUS  Recent Labs Lab 03/13/14 1637  LATICACIDVEN 1.3     ENDOCRINE CBG (last 3)  No results for input(s): GLUCAP in the last 72  hours.       IMAGING x48h Dg Chest Port 1 View  03/17/2014   CLINICAL DATA:  Acute respiratory failure.  EXAM: PORTABLE CHEST - 1 VIEW  COMPARISON:  03/13/2014  FINDINGS: Support apparatus: Right-sided PICC line tip low SVC.  New.  Cardiomediastinal silhouette: Moderate enlargement of the cardiopericardial silhouette, similar.  Pleura: No right-sided pleural effusion. The left pleural space is poorly evaluated secondary to the extent of cardiomegaly. Right hemidiaphragm elevation. No pneumothorax.  Lungs: Improvement in mild interstitial edema. Left greater than right bibasilar airspace disease. Grossly similar.  Other: None  IMPRESSION: Improvement in mild interstitial edema.  Cardiomegaly.  Cannot exclude pericardial effusion.  Bibasilar opacities. Favor atelectasis. At the left lung base, infection cannot be excluded.   Electronically Signed   By: Abigail Miyamoto M.D.   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  Strong history of poor compliance / intolerance to rx.  Suspect increased dyspnea and hypotension are r/t overall worsening of PAH, c/b poor compliance with new pericardial effusion contributing to dyspnea as well.   Hypotension Large new pericardial effusion. Likely reflects her overall total body volume overload, 3rd spacing. Unclear that it is having hemodynamic effects although possible given its size Pleural effusion.    Discussion:  > Her PASP has been profoundly elevated for months (years actually) without much dyspnea. Certainly most likely scenario is that the [facility] has progressed and she has decompensated sx. Must also consider acute events with more precipitous decline. No evidence PE on V/q scan 12/29, CXR Had improved with diuresis , decreased edema . Marland Kitchen Pericardial effusion may be playing a role, difficult to say based on only TTE findings. Cardiology is following w/ plans for RHC  tomorrow   P:   -Continue letairis +  Revatio  (PDE inhibitor with shorter half life than adcirca) for now although she has had side effects with this in the past -IV lasix to oral lasix per cards  - Continue milrinone per catds -  - awiat Right heart cath tomorrow          Rayvin Abid NP-C  [facility]  [phone]    03/17/2014 2:27 PM

## 2014-03-18 ENCOUNTER — Encounter (HOSPITAL_COMMUNITY)
Admission: EM | Disposition: A | Discharge status: Home / Self Care | Payer: Self-pay | Source: Home / Self Care | Attending: Emergency Medicine

## 2014-03-18 ENCOUNTER — Telehealth: Payer: Self-pay | Admitting: Emergency Medicine

## 2014-03-18 ENCOUNTER — Encounter (HOSPITAL_COMMUNITY): Payer: Self-pay | Admitting: Cardiology

## 2014-03-18 DIAGNOSIS — I319 Disease of pericardium, unspecified: Secondary | ICD-10-CM

## 2014-03-18 DIAGNOSIS — I270 Primary pulmonary hypertension: Secondary | ICD-10-CM

## 2014-03-18 DIAGNOSIS — J90 Pleural effusion, not elsewhere classified: Secondary | ICD-10-CM

## 2014-03-18 HISTORY — PX: RIGHT HEART CATHETERIZATION: SHX5447

## 2014-03-18 LAB — BASIC METABOLIC PANEL
Anion gap: 10 (ref 5–15)
BUN: 54 mg/dL — AB (ref 6–23)
CALCIUM: 9.4 mg/dL (ref 8.4–10.5)
CO2: 26 mmol/L (ref 19–32)
CREATININE: 1.58 mg/dL — AB (ref 0.50–1.10)
Chloride: 100 mEq/L (ref 96–112)
GFR calc Af Amer: 39 mL/min — ABNORMAL LOW (ref >90)
GFR, EST NON AFRICAN AMERICAN: 34 mL/min — AB (ref >90)
GLUCOSE: 133 mg/dL — AB (ref 70–99)
Potassium: 3.9 mmol/L (ref 3.5–5.1)
SODIUM: 136 mmol/L (ref 135–145)

## 2014-03-18 LAB — CBC
HCT: 28.6 % — ABNORMAL LOW (ref 36.0–46.0)
Hemoglobin: 9.7 g/dL — ABNORMAL LOW (ref 12.0–15.0)
MCH: 26.9 pg (ref 26.0–34.0)
MCHC: 33.9 g/dL (ref 30.0–36.0)
MCV: 79.4 fL (ref 78.0–100.0)
PLATELETS: 173 10*3/uL (ref 150–400)
RBC: 3.6 MIL/uL — ABNORMAL LOW (ref 3.87–5.11)
RDW: 14.1 % (ref 11.5–15.5)
WBC: 7 10*3/uL (ref 4.0–10.5)

## 2014-03-18 LAB — GLUCOSE, CAPILLARY: Glucose-Capillary: 81 mg/dL (ref 70–99)

## 2014-03-18 SURGERY — RIGHT HEART CATH
Anesthesia: LOCAL

## 2014-03-18 MED ORDER — LIDOCAINE HCL (PF) 1 % IJ SOLN
INTRAMUSCULAR | Status: AC
  Filled 2014-03-18: qty 30

## 2014-03-18 MED ORDER — MIDAZOLAM HCL 2 MG/2ML IJ SOLN
INTRAMUSCULAR | Status: AC
  Filled 2014-03-18: qty 2

## 2014-03-18 MED ORDER — SODIUM CHLORIDE 0.9 % IJ SOLN: 3.0000 mL | INTRAMUSCULAR | Status: DC | PRN

## 2014-03-18 MED ORDER — HEPARIN SODIUM (PORCINE) 5000 UNIT/ML IJ SOLN
5000.0000 [IU] | Freq: Three times a day (TID) | INTRAMUSCULAR | Status: DC
  Administered 2014-03-18: 5000 [IU] via SUBCUTANEOUS

## 2014-03-18 MED ORDER — HEPARIN (PORCINE) IN NACL 2-0.9 UNIT/ML-% IJ SOLN
INTRAMUSCULAR | Status: AC
  Filled 2014-03-18: qty 1000

## 2014-03-18 MED ORDER — SODIUM CHLORIDE 0.9 % IV SOLN: 250.0000 mL | INTRAVENOUS | Status: DC | PRN

## 2014-03-18 MED ORDER — SODIUM CHLORIDE 0.9 % IJ SOLN
3.0000 mL | Freq: Two times a day (BID) | INTRAMUSCULAR | Status: DC
  Administered 2014-03-19 – 2014-03-20 (×3): 3 mL via INTRAVENOUS

## 2014-03-18 MED ORDER — FUROSEMIDE 10 MG/ML IJ SOLN
40.0000 mg | Freq: Two times a day (BID) | INTRAMUSCULAR | Status: DC
  Administered 2014-03-18: 40 mg via INTRAVENOUS
  Filled 2014-03-18 (×3): qty 4

## 2014-03-18 MED ORDER — FENTANYL CITRATE 0.05 MG/ML IJ SOLN
INTRAMUSCULAR | Status: AC
  Filled 2014-03-18: qty 2

## 2014-03-18 MED ORDER — ACETAMINOPHEN 325 MG PO TABS
650.0000 mg | ORAL_TABLET | ORAL | Status: DC | PRN
  Administered 2014-03-20: 650 mg via ORAL
  Filled 2014-03-18: qty 2

## 2014-03-18 MED ORDER — ONDANSETRON HCL 4 MG/2ML IJ SOLN
4.0000 mg | Freq: Four times a day (QID) | INTRAMUSCULAR | Status: DC | PRN
  Administered 2014-03-22: 4 mg via INTRAVENOUS
  Filled 2014-03-18: qty 2

## 2014-03-18 NOTE — CV Procedure (Signed)
Cardiac Catheterization Procedure Note  Name: [name] MRN: [id number] DOB: 02-09-1951  Procedure: Right Heart Cath Indication: Pulmonary hypertension   Procedural Details: The right groin was prepped, draped, and anesthetized with 1% lidocaine. Using the modified Seldinger technique a 7 French sheath was placed in the right femoral vein and a 5 French sheath was placed in the right femoral artery. A Swan-Ganz catheter was used for the right heart catheterization. Standard protocol was followed for recording of right heart pressures and sampling of oxygen saturations. Fick and thermodilution cardiac output was calculated. Pigtail catheter was used to enter the LV.  Simultaneous LV and RV pressures were recorded.  There were no immediate procedural complications. The patient was transferred to the post catheterization recovery area for further monitoring.  Procedural Findings: Hemodynamics (mmHg) RA mean 12 RV 64/14 PA 68/29, mean 42 PCWP mean 12 LV 102/14 AO 105/53  There was respirophasic ventricular interdependence noted with simultaneous RV and LV pressure tracings (LV systolic pressure fell as RV systolic pressure rose and vice versa).    Oxygen saturations: PA 67% AO 93%  Cardiac Output (Fick) 6.63  Cardiac Index (Fick) 3.88  PVR 4.5 WU  Cardiac Output (Thermo) 5.97 Cardiac Index (Thermo) 3.49  PVR 5 WU   Final Conclusions:  Moderate pulmonary arterial hypertension.  There is respirophasic ventricular interdependence noted on simultaneous RV and LV pressure tracings (LV systolic pressure fell as RV systolic pressure rose and vice versa).  This suggests tamponade physiology.  Given her pulmonary hypertension, I would like to try to diurese her more before trying pericardiocentesis as this would be higher risk. Continue IV lasix but follow creatinine closely.   [name] 03/18/2014, 3:04 PM

## 2014-03-18 NOTE — Progress Notes (Signed)
  Echocardiogram 2D Echocardiogram has been performed.  Darlina Sicilian M 03/18/2014, 10:02 AM

## 2014-03-18 NOTE — Progress Notes (Addendum)
Letairis: Patient states she is unable to get more of home med without filling out papers. Amy NP with Cards informed and following up with pharmacy. Will put not given for today.   Medication arrived with family; given at 17:30. 14m PO

## 2014-03-18 NOTE — H&P (View-Only) (Signed)
Patient ID: [name], female   DOB: October 14, 1950, 64 y.o.   MRN: [id number]   SUBJECTIVE: [name] is a 64 yo female with a history of DM2, HLD, HTN, pHTN, GERD, gastroparesis, scleroderma, DJD and diastolic HF.   Of note she has been on several PAH targeted therapies and coumadin over last 9 years, but has not been reliably on therapy due to noncompliance and inability to have labs performed. She has had best response to Tyvaso (+ bosentan) with an improvement in 6 minute walk and in PASP from 102 mmHg (7/09) to 42 mmHg (9/10). After stopping Tyvaso her PASP rose to 80's by TTE 6/13 and 11/14. Surprisingly, she has never required supplemental O2. Macitentan was started in 11/14. She developed nausea and diarrhea and macitentan was stopped early 3/15. Most recently, she had been on Letairis and Adcirca (followed by Dr. Lamonte Sakai).   Admitted 10/14-10/18/15 for A/C RHF, volume overload and syncope. Diuresed with IV lasix and milrinone a total of 26 lbs. Discharge weight was 156 lbs and started on lasix 40 mg BID.   She was admitted again on 12/29 with dyspnea and chest pain.  BP was low, and Lasix and Adcirca were both initially held.  Echo showed EF 60-65% with moderately dilated and dysfunctional RV, PASP 111 mmHg with moderate TR.  There was a large pericardial effusion without definite tamponade.  Presentation was suspected to be due to progressive PAH with secondary RV failure rather than tamponade.  No pericardiocentesis yet. She was started on milrinone gtt and diuresed. Shorter-acting sildenafil was started instead of Adcirca.   Yesterday she was transitioned to po lasix. Weight unchanged. CVP 5-6.  Mild dizziness standing.      Mild rise in creatinine. 1.37>1.58  Scheduled Meds: . ambrisentan  10 mg Oral Daily  . aspirin EC  81 mg Oral Daily  . fenofibrate  54 mg Oral Daily  . ferrous sulfate  325 mg Oral Q breakfast  . FLUoxetine  20 mg Oral q morning - 10a  . furosemide  40 mg Oral  BID  . heparin subcutaneous  5,000 Units Subcutaneous 3 times per day  . pantoprazole  40 mg Oral Daily  . pregabalin  150 mg Oral TID  . sildenafil  20 mg Oral TID  . sodium chloride  10-40 mL Intracatheter Q12H  . sodium chloride  3 mL Intravenous Q12H  . sodium chloride  3 mL Intravenous Q12H   Continuous Infusions: . sodium chloride 5 mL/hr at 03/16/14 2000  . milrinone 0.25 mcg/kg/min (03/17/14 1800)   PRN Meds:.sodium chloride, HYDROcodone-acetaminophen, sodium chloride, sodium chloride   Filed Vitals:   03/18/14 0200 03/18/14 0300 03/18/14 0400 03/18/14 0500  BP: _0 80/41  Pulse: 85 81 83 87  Temp:      TempSrc:      Resp:    12  Height:      Weight:    146 lb 2.6 oz (66.3 kg)  SpO2: 97% 96% 97% 99%    Intake/Output Summary (Last 24 hours) at 03/18/14 0716 Last data filed at 03/18/14 0500  Gross per 24 hour  Intake  719.7 ml  Output   1650 ml  Net -930.3 ml    LABS: Basic Metabolic Panel:  Recent Labs  03/17/14 0515 03/18/14 0440  NA 137 136  K 4.0 3.9  CL 101 100  CO2 29 26  GLUCOSE 113* 133*  BUN 44* 54*  CREATININE 1.37* 1.58*  CALCIUM 9.5 9.4  Liver Function Tests: No results for input(s): AST, ALT, ALKPHOS, BILITOT, PROT, ALBUMIN in the last 72 hours. No results for input(s): LIPASE, AMYLASE in the last 72 hours. CBC:  Recent Labs  03/17/14 0515 03/18/14 0440  WBC 5.7 7.0  HGB 9.7* 9.7*  HCT 29.0* 28.6*  MCV 79.7 79.4  PLT 161 173   Cardiac Enzymes: No results for input(s): CKTOTAL, CKMB, CKMBINDEX, TROPONINI in the last 72 hours. BNP: Invalid input(s): POCBNP D-Dimer: No results for input(s): DDIMER in the last 72 hours. Hemoglobin A1C: No results for input(s): HGBA1C in the last 72 hours. Fasting Lipid Panel: No results for input(s): CHOL, HDL, LDLCALC, TRIG, CHOLHDL, LDLDIRECT in the last 72 hours. Thyroid Function Tests: No results for input(s): TSH, T4TOTAL, T3FREE, THYROIDAB in the last 72 hours.  Invalid  input(s): FREET3 Anemia Panel: No results for input(s): VITAMINB12, FOLATE, FERRITIN, TIBC, IRON, RETICCTPCT in the last 72 hours.  RADIOLOGY: Ct Abdomen Pelvis Wo Contrast  03/11/2014   CLINICAL DATA:  Initial evaluation for shortness of breath, abdominal pain.  EXAM: CT ABDOMEN AND PELVIS WITHOUT CONTRAST  TECHNIQUE: Multidetector CT imaging of the abdomen and pelvis was performed following the standard protocol without IV contrast.  COMPARISON:  None.  FINDINGS: Bilateral pleural effusions are partially visualized, right greater than left. There is associated bibasilar atelectasis. Moderate to large pericardial effusion is partially visualized.  Scattered cystic lesions within the right hepatic lobe are stable. Liver is otherwise unremarkable. Gallbladder within normal limits. No biliary dilatation. Spleen, adrenal glands, and pancreas demonstrate a normal unenhanced appearance.  Kidneys are equal in size without evidence of nephrolithiasis or hydronephrosis.  Distal esophagus is somewhat patulous. Stomach within normal limits. No evidence for bowel obstruction. No abnormal wall thickening or inflammatory changes seen about the bowels.  Bladder within normal limits. Uterus and ovaries are not visualized.  Small volume free fluid seen adjacent to the liver.  No free air.  Retroperitoneal adenopathy again seen, similar to prior. Again, the most prominent node is seen in the left periaortic region and 1.5 cm in short axis (series 2, image 50, previously 1.6 cm). Left iliac node measures up to 1.3 cm in short axis (series 2, image 73). There are additional mildly prominent but not enlarged iliac nodes bilaterally. No pathologically enlarged inguinal lymph nodes identified. Overall, the degree of adenopathy is stable to slightly decreased from prior study.  No acute osseous abnormality. No worrisome lytic or blastic osseous lesions.  Mild diffuse anasarca.  IMPRESSION: 1. Moderate pericardial effusion with  small bilateral pleural effusions, right greater than left, and small volume ascites. 2. Stable to slightly decreased size of retroperitoneal and iliac adenopathy as above. These nodes are indeterminate. Continued attention on followup is recommended. Additionally, correlation with PET-CT could be considered for further evaluation. 3. No other acute intra-abdominal pelvic process identified.   Electronically Signed   By: Jeannine Boga M.D.   On: 03/11/2014 23:59   Dg Chest 2 View  03/13/2014   CLINICAL DATA:  Shortness of breath.  EXAM: CHEST  2 VIEW  COMPARISON:  CT 02/15/2012.  Chest x-ray 12/26/2013.  FINDINGS: Mediastinum hilar structures are unremarkable. Persistent severe cardiomegaly. Pulmonary venous congestion. Diffuse from interstitial prominence and mild pleural effusions previous present consistent with congestive heart failure. A component of the severe cardiomegaly may be related to previously identified pericardial effusion. No pneumothorax. No acute osseus abnormality.  IMPRESSION: 1. Severe cardiomegaly with congestive heart failure. Bilateral interstitial edema and small pleural effusions are present.  2. A component of the cardiomegaly may be related to previously identified pericardial effusion.   Electronically Signed   By: [place]   On: 03/13/2014 07:57   Dg Chest 2 View  03/11/2014   CLINICAL DATA:  Chest pain and shortness of breath.  EXAM: CHEST  2 VIEW  COMPARISON:  Chest radiograph 12/26/2013  FINDINGS: Marked cardiomegaly. Bilateral perihilar and diffuse interstitial pulmonary opacities. No definite pleural effusion or pneumothorax. Mid thoracic spine degenerative change. Prominent small bowel loops, incompletely visualized.  IMPRESSION: Cardiomegaly and mild interstitial pulmonary edema.  Prominent upper abdominal small bowel loops, incompletely visualized. Consider correlation with dedicated abdominal radiography.   Electronically Signed   By: Lovey Newcomer M.D.    On: 03/11/2014 21:15   Nm Pulmonary Perf And Vent  03/12/2014   CLINICAL DATA:  64 year old female with acute shortness of breath and chest pain. Initial encounter.  EXAM: NUCLEAR MEDICINE PERFUSION LUNG SCAN  TECHNIQUE: Perfusion images were obtained in multiple projections after intravenous injection of radiopharmaceutical.  The patient was un able to perform ventilation.  After only two image acquisitions, the patient did not want to continue with the perfusion study.  RADIOPHARMACEUTICALS:  6 mCi Tc-18mMAA  COMPARISON:  03/11/2014 abdominal CT and chest radiograph.  FINDINGS: No perfusion defects are identified.  Cardiomegaly and pericardial effusion as seen on CT attenuates left lower lung activity.  IMPRESSION: No perfusion defects identified -no evidence of pulmonary emboli.   Electronically Signed   By: JHassan RowanM.D.   On: 03/12/2014 19:04   Dg Chest Port 1 View  03/17/2014   CLINICAL DATA:  Acute respiratory failure.  EXAM: PORTABLE CHEST - 1 VIEW  COMPARISON:  03/13/2014  FINDINGS: Support apparatus: Right-sided PICC line tip low SVC.  New.  Cardiomediastinal silhouette: Moderate enlargement of the cardiopericardial silhouette, similar.  Pleura: No right-sided pleural effusion. The left pleural space is poorly evaluated secondary to the extent of cardiomegaly. Right hemidiaphragm elevation. No pneumothorax.  Lungs: Improvement in mild interstitial edema. Left greater than right bibasilar airspace disease. Grossly similar.  Other: None  IMPRESSION: Improvement in mild interstitial edema.  Cardiomegaly.  Cannot exclude pericardial effusion.  Bibasilar opacities. Favor atelectasis. At the left lung base, infection cannot be excluded.   Electronically Signed   By: KAbigail MiyamotoM.D.   On: 03/17/2014 07:53   Dg Knee Complete 4 Views Left  02/27/2014   CLINICAL DATA:  Trauma yesterday with pain and swelling. History of left knee arthroplasty.  EXAM: LEFT KNEE - COMPLETE 4+ VIEW  COMPARISON:  12/26/2010   FINDINGS: Long-stem total knee arthroplasty. No acute fracture or dislocation. Cannot exclude small suprapatellar joint effusion. No acute hardware complication.  IMPRESSION: No acute osseous abnormality. Total knee arthroplasty. Cannot exclude small suprapatellar joint effusion.   Electronically Signed   By: KAbigail MiyamotoM.D.   On: 02/27/2014 15:18   Dg Abd Portable 1v  03/11/2014   CLINICAL DATA:  One day history of abdominal pain  EXAM: PORTABLE ABDOMEN - 1 VIEW  COMPARISON:  June 07, 2013  FINDINGS: There is moderate stool in the colon. Bowel gas pattern is unremarkable. No obstruction or free air is seen on this supine examination. There are small phleboliths in the pelvis. Liver appears prominent but stable.  IMPRESSION: Bowel gas pattern unremarkable.  Liver prominent but stable.   Electronically Signed   By: WLowella GripM.D.   On: 03/11/2014 21:45    PHYSICAL EXAM CVP 5-6 General: NAD Neck: JVP  not elevated, no thyromegaly or thyroid nodule.  Lungs: Crackles at bases bilaterally.  CV: RV heave.  Heart regular S1/S2, no S3/S4, 2/6 HSM LLSB.  No peripheral edema.   Abdomen: Soft, nontender, no hepatosplenomegaly, no distention.  Neurologic: Alert and oriented x 3.  Psych: Normal affect. Extremities: No clubbing or cyanosis.   TELEMETRY: Reviewed telemetry pt in NSR  ASSESSMENT AND PLAN: 64 yo with history of PAH and scleroderma was admitted with right-sided heart failure and large pericardial effusion.  Treatment has been limited in past by multiple med intolerances and compliance issues.  1. Right-sided heart failure: RV moderately dilated/dysfunctional on echo with volume overload.  She is diuresing well with milrinone for RV support/lowering of PA pressures.   - Continue lasix 40 mg po bid.  - Continue milrinone 0.25 mg for now.   2. PAH:  Severe PAH, PASP 111 mmHg by echo this admission, higher than in the past.  She had been on Letairis and Adcirca at home.  Adcirca held  with low BP and sildenafil begun.  She is tolerating this so far.   - Continue milrinone for RV support and lowering of PA pressure.    - Continue Letairis and sildenafil for now, increased Letairis to 10 mg daily 1/3 now that she appears diuresed.  Agree that she would be a poor IV epoprostenol candidate with med intolerances and compliance issues.  She did not like taking Tyvaso in the past but seemed to have had the best response to this agent.  Orenitram (po treprostinil) would be a consideration but trials in combination with PDE-5 inhibitors and endothelin antagonists did not show additive benefit from orenitram. She is going to think about Tyvaso.  She has been diuresed well at this point. Will plan RHC today if PCWP is not significantly elevated will consider addition of Tyvaso while in hospital. 3. Pericardial effusion: Large pericardial effusion without definite tamponade, but difficult to tell hemodynamic effect given severe pulmonary hypertension with RV failure.  Often see pericardial effusion with severe pulmonary hypertension/RV failure.  Plan to repeat echo in a few days to reassess the effusion.  Would like to avoid pericardiocentesis if possible given higher risk with severe PAH.  4. Needs to ambulate => PT, cardiac rehab.   CLEGG,AMY NP-C  03/18/2014 7:16 AM  Patient seen with NP, agree with the above note.  Breathing better, remains on milrinone.  She was diuresed extensively and weight is down.  Creatinine up.  I am going to hold po Lasix today to allow creatinine to equilibrate.  She will have RHC, will then decide on Tyvaso.  She is not excited to restart it but would be willing to try again.  Will need repeat limited echo to reassess pericardial effusion now that she has had some diuresis.  Will order today.   [name] 03/18/2014 7:38 AM

## 2014-03-18 NOTE — Telephone Encounter (Signed)
Called the [facility] at the number provided  I asked for Lattie Haw, and then was advised there is more than one person named Lattie Haw who works there  I was transferred to "the only Lattie Haw who is here now"- and she knew nothing about this pt  I called back again and spoke with someone who states that "the [place] with the heart failure clinic probably called" He will give her msg to call back

## 2014-03-18 NOTE — Progress Notes (Signed)
Site area: RFA/RFV Site Prior to Removal:  Level  0 Pressure Applied For:20 min Manual:   yes Patient Status During Pull:  stable Post Pull Site:  Level 0 Post Pull Instructions Given:  yes Post Pull Pulses Present: palpable Dressing Applied:   Bedrest begins @ 1540 Comments:

## 2014-03-18 NOTE — Telephone Encounter (Signed)
Pt is in the hospital. Lattie Haw from pharmacy at [facility] has called back. Please call [phone] which is the main pharmacy #. Pt is out of her med.

## 2014-03-18 NOTE — Interval H&P Note (Signed)
History and Physical Interval Note:  03/18/2014 2:21 PM  [name]  has presented today for surgery, with the diagnosis of hf  The various methods of treatment have been discussed with the patient and family. After consideration of risks, benefits and other options for treatment, the patient has consented to  Procedure(s): RIGHT HEART CATH (N/A) as a surgical intervention .  The patient's history has been reviewed, patient examined, no change in status, stable for surgery.  I have reviewed the patient's chart and labs.  Questions were answered to the patient's satisfaction.     [place] [employer]

## 2014-03-18 NOTE — Progress Notes (Signed)
PT Cancellation Note  Patient Details Name: [name] MRN: [id number] DOB: Mar 30, 1950   Cancelled Treatment:    Reason Eval/Treat Not Completed: Patient at procedure or test/unavailable   Deloris Moger 03/18/2014, 2:26 PM

## 2014-03-18 NOTE — Progress Notes (Addendum)
Patient ID: [name], female   DOB: February 17, 1951, 64 y.o.   MRN: [id number]   SUBJECTIVE: [name] is a 63 yo female with a history of DM2, HLD, HTN, pHTN, GERD, gastroparesis, scleroderma, DJD and diastolic HF.   Of note she has been on several PAH targeted therapies and coumadin over last 9 years, but has not been reliably on therapy due to noncompliance and inability to have labs performed. She has had best response to Tyvaso (+ bosentan) with an improvement in 6 minute walk and in PASP from 102 mmHg (7/09) to 42 mmHg (9/10). After stopping Tyvaso her PASP rose to 80's by TTE 6/13 and 11/14. Surprisingly, she has never required supplemental O2. Macitentan was started in 11/14. She developed nausea and diarrhea and macitentan was stopped early 3/15. Most recently, she had been on Letairis and Adcirca (followed by Dr. Lamonte Sakai).   Admitted 10/14-10/18/15 for A/C RHF, volume overload and syncope. Diuresed with IV lasix and milrinone a total of 26 lbs. Discharge weight was 156 lbs and started on lasix 40 mg BID.   She was admitted again on 12/29 with dyspnea and chest pain.  BP was low, and Lasix and Adcirca were both initially held.  Echo showed EF 60-65% with moderately dilated and dysfunctional RV, PASP 111 mmHg with moderate TR.  There was a large pericardial effusion without definite tamponade.  Presentation was suspected to be due to progressive PAH with secondary RV failure rather than tamponade.  No pericardiocentesis yet. She was started on milrinone gtt and diuresed. Shorter-acting sildenafil was started instead of Adcirca.   Yesterday she was transitioned to po lasix. Weight unchanged. CVP 5-6.  Mild dizziness standing.      Mild rise in creatinine. 1.37>1.58  Scheduled Meds: . ambrisentan  10 mg Oral Daily  . aspirin EC  81 mg Oral Daily  . fenofibrate  54 mg Oral Daily  . ferrous sulfate  325 mg Oral Q breakfast  . FLUoxetine  20 mg Oral q morning - 10a  . furosemide  40 mg Oral  BID  . heparin subcutaneous  5,000 Units Subcutaneous 3 times per day  . pantoprazole  40 mg Oral Daily  . pregabalin  150 mg Oral TID  . sildenafil  20 mg Oral TID  . sodium chloride  10-40 mL Intracatheter Q12H  . sodium chloride  3 mL Intravenous Q12H  . sodium chloride  3 mL Intravenous Q12H   Continuous Infusions: . sodium chloride 5 mL/hr at 03/16/14 2000  . milrinone 0.25 mcg/kg/min (03/17/14 1800)   PRN Meds:.sodium chloride, HYDROcodone-acetaminophen, sodium chloride, sodium chloride   Filed Vitals:   03/18/14 0200 03/18/14 0300 03/18/14 0400 03/18/14 0500  BP: _0 80/41  Pulse: 85 81 83 87  Temp:      TempSrc:      Resp:    12  Height:      Weight:    146 lb 2.6 oz (66.3 kg)  SpO2: 97% 96% 97% 99%    Intake/Output Summary (Last 24 hours) at 03/18/14 0716 Last data filed at 03/18/14 0500  Gross per 24 hour  Intake  719.7 ml  Output   1650 ml  Net -930.3 ml    LABS: Basic Metabolic Panel:  Recent Labs  03/17/14 0515 03/18/14 0440  NA 137 136  K 4.0 3.9  CL 101 100  CO2 29 26  GLUCOSE 113* 133*  BUN 44* 54*  CREATININE 1.37* 1.58*  CALCIUM 9.5 9.4  Liver Function Tests: No results for input(s): AST, ALT, ALKPHOS, BILITOT, PROT, ALBUMIN in the last 72 hours. No results for input(s): LIPASE, AMYLASE in the last 72 hours. CBC:  Recent Labs  03/17/14 0515 03/18/14 0440  WBC 5.7 7.0  HGB 9.7* 9.7*  HCT 29.0* 28.6*  MCV 79.7 79.4  PLT 161 173   Cardiac Enzymes: No results for input(s): CKTOTAL, CKMB, CKMBINDEX, TROPONINI in the last 72 hours. BNP: Invalid input(s): POCBNP D-Dimer: No results for input(s): DDIMER in the last 72 hours. Hemoglobin A1C: No results for input(s): HGBA1C in the last 72 hours. Fasting Lipid Panel: No results for input(s): CHOL, HDL, LDLCALC, TRIG, CHOLHDL, LDLDIRECT in the last 72 hours. Thyroid Function Tests: No results for input(s): TSH, T4TOTAL, T3FREE, THYROIDAB in the last 72 hours.  Invalid  input(s): FREET3 Anemia Panel: No results for input(s): VITAMINB12, FOLATE, FERRITIN, TIBC, IRON, RETICCTPCT in the last 72 hours.  RADIOLOGY: Ct Abdomen Pelvis Wo Contrast  03/11/2014   CLINICAL DATA:  Initial evaluation for shortness of breath, abdominal pain.  EXAM: CT ABDOMEN AND PELVIS WITHOUT CONTRAST  TECHNIQUE: Multidetector CT imaging of the abdomen and pelvis was performed following the standard protocol without IV contrast.  COMPARISON:  None.  FINDINGS: Bilateral pleural effusions are partially visualized, right greater than left. There is associated bibasilar atelectasis. Moderate to large pericardial effusion is partially visualized.  Scattered cystic lesions within the right hepatic lobe are stable. Liver is otherwise unremarkable. Gallbladder within normal limits. No biliary dilatation. Spleen, adrenal glands, and pancreas demonstrate a normal unenhanced appearance.  Kidneys are equal in size without evidence of nephrolithiasis or hydronephrosis.  Distal esophagus is somewhat patulous. Stomach within normal limits. No evidence for bowel obstruction. No abnormal wall thickening or inflammatory changes seen about the bowels.  Bladder within normal limits. Uterus and ovaries are not visualized.  Small volume free fluid seen adjacent to the liver.  No free air.  Retroperitoneal adenopathy again seen, similar to prior. Again, the most prominent node is seen in the left periaortic region and 1.5 cm in short axis (series 2, image 50, previously 1.6 cm). Left iliac node measures up to 1.3 cm in short axis (series 2, image 73). There are additional mildly prominent but not enlarged iliac nodes bilaterally. No pathologically enlarged inguinal lymph nodes identified. Overall, the degree of adenopathy is stable to slightly decreased from prior study.  No acute osseous abnormality. No worrisome lytic or blastic osseous lesions.  Mild diffuse anasarca.  IMPRESSION: 1. Moderate pericardial effusion with  small bilateral pleural effusions, right greater than left, and small volume ascites. 2. Stable to slightly decreased size of retroperitoneal and iliac adenopathy as above. These nodes are indeterminate. Continued attention on followup is recommended. Additionally, correlation with PET-CT could be considered for further evaluation. 3. No other acute intra-abdominal pelvic process identified.   Electronically Signed   By: Jeannine Boga M.D.   On: 03/11/2014 23:59   Dg Chest 2 View  03/13/2014   CLINICAL DATA:  Shortness of breath.  EXAM: CHEST  2 VIEW  COMPARISON:  CT 02/15/2012.  Chest x-ray 12/26/2013.  FINDINGS: Mediastinum hilar structures are unremarkable. Persistent severe cardiomegaly. Pulmonary venous congestion. Diffuse from interstitial prominence and mild pleural effusions previous present consistent with congestive heart failure. A component of the severe cardiomegaly may be related to previously identified pericardial effusion. No pneumothorax. No acute osseus abnormality.  IMPRESSION: 1. Severe cardiomegaly with congestive heart failure. Bilateral interstitial edema and small pleural effusions are present.  2. A component of the cardiomegaly may be related to previously identified pericardial effusion.   Electronically Signed   By: [place]   On: 03/13/2014 07:57   Dg Chest 2 View  03/11/2014   CLINICAL DATA:  Chest pain and shortness of breath.  EXAM: CHEST  2 VIEW  COMPARISON:  Chest radiograph 12/26/2013  FINDINGS: Marked cardiomegaly. Bilateral perihilar and diffuse interstitial pulmonary opacities. No definite pleural effusion or pneumothorax. Mid thoracic spine degenerative change. Prominent small bowel loops, incompletely visualized.  IMPRESSION: Cardiomegaly and mild interstitial pulmonary edema.  Prominent upper abdominal small bowel loops, incompletely visualized. Consider correlation with dedicated abdominal radiography.   Electronically Signed   By: Lovey Newcomer M.D.    On: 03/11/2014 21:15   Nm Pulmonary Perf And Vent  03/12/2014   CLINICAL DATA:  64 year old female with acute shortness of breath and chest pain. Initial encounter.  EXAM: NUCLEAR MEDICINE PERFUSION LUNG SCAN  TECHNIQUE: Perfusion images were obtained in multiple projections after intravenous injection of radiopharmaceutical.  The patient was un able to perform ventilation.  After only two image acquisitions, the patient did not want to continue with the perfusion study.  RADIOPHARMACEUTICALS:  6 mCi Tc-91mMAA  COMPARISON:  03/11/2014 abdominal CT and chest radiograph.  FINDINGS: No perfusion defects are identified.  Cardiomegaly and pericardial effusion as seen on CT attenuates left lower lung activity.  IMPRESSION: No perfusion defects identified -no evidence of pulmonary emboli.   Electronically Signed   By: JHassan RowanM.D.   On: 03/12/2014 19:04   Dg Chest Port 1 View  03/17/2014   CLINICAL DATA:  Acute respiratory failure.  EXAM: PORTABLE CHEST - 1 VIEW  COMPARISON:  03/13/2014  FINDINGS: Support apparatus: Right-sided PICC line tip low SVC.  New.  Cardiomediastinal silhouette: Moderate enlargement of the cardiopericardial silhouette, similar.  Pleura: No right-sided pleural effusion. The left pleural space is poorly evaluated secondary to the extent of cardiomegaly. Right hemidiaphragm elevation. No pneumothorax.  Lungs: Improvement in mild interstitial edema. Left greater than right bibasilar airspace disease. Grossly similar.  Other: None  IMPRESSION: Improvement in mild interstitial edema.  Cardiomegaly.  Cannot exclude pericardial effusion.  Bibasilar opacities. Favor atelectasis. At the left lung base, infection cannot be excluded.   Electronically Signed   By: KAbigail MiyamotoM.D.   On: 03/17/2014 07:53   Dg Knee Complete 4 Views Left  02/27/2014   CLINICAL DATA:  Trauma yesterday with pain and swelling. History of left knee arthroplasty.  EXAM: LEFT KNEE - COMPLETE 4+ VIEW  COMPARISON:  12/26/2010   FINDINGS: Long-stem total knee arthroplasty. No acute fracture or dislocation. Cannot exclude small suprapatellar joint effusion. No acute hardware complication.  IMPRESSION: No acute osseous abnormality. Total knee arthroplasty. Cannot exclude small suprapatellar joint effusion.   Electronically Signed   By: KAbigail MiyamotoM.D.   On: 02/27/2014 15:18   Dg Abd Portable 1v  03/11/2014   CLINICAL DATA:  One day history of abdominal pain  EXAM: PORTABLE ABDOMEN - 1 VIEW  COMPARISON:  June 07, 2013  FINDINGS: There is moderate stool in the colon. Bowel gas pattern is unremarkable. No obstruction or free air is seen on this supine examination. There are small phleboliths in the pelvis. Liver appears prominent but stable.  IMPRESSION: Bowel gas pattern unremarkable.  Liver prominent but stable.   Electronically Signed   By: WLowella GripM.D.   On: 03/11/2014 21:45    PHYSICAL EXAM CVP 5-6 General: NAD Neck: JVP  not elevated, no thyromegaly or thyroid nodule.  Lungs: Crackles at bases bilaterally.  CV: RV heave.  Heart regular S1/S2, no S3/S4, 2/6 HSM LLSB.  No peripheral edema.   Abdomen: Soft, nontender, no hepatosplenomegaly, no distention.  Neurologic: Alert and oriented x 3.  Psych: Normal affect. Extremities: No clubbing or cyanosis.   TELEMETRY: Reviewed telemetry pt in NSR  ASSESSMENT AND PLAN: 64 yo with history of PAH and scleroderma was admitted with right-sided heart failure and large pericardial effusion.  Treatment has been limited in past by multiple med intolerances and compliance issues.  1. Right-sided heart failure: RV moderately dilated/dysfunctional on echo with volume overload.  She is diuresing well with milrinone for RV support/lowering of PA pressures.   - Continue lasix 40 mg po bid.  - Continue milrinone 0.25 mg for now.   2. PAH:  Severe PAH, PASP 111 mmHg by echo this admission, higher than in the past.  She had been on Letairis and Adcirca at home.  Adcirca held  with low BP and sildenafil begun.  She is tolerating this so far.   - Continue milrinone for RV support and lowering of PA pressure.    - Continue Letairis and sildenafil for now, increased Letairis to 10 mg daily 1/3 now that she appears diuresed.  Agree that she would be a poor IV epoprostenol candidate with med intolerances and compliance issues.  She did not like taking Tyvaso in the past but seemed to have had the best response to this agent.  Orenitram (po treprostinil) would be a consideration but trials in combination with PDE-5 inhibitors and endothelin antagonists did not show additive benefit from orenitram. She is going to think about Tyvaso.  She has been diuresed well at this point. Will plan RHC today if PCWP is not significantly elevated will consider addition of Tyvaso while in hospital. 3. Pericardial effusion: Large pericardial effusion without definite tamponade, but difficult to tell hemodynamic effect given severe pulmonary hypertension with RV failure.  Often see pericardial effusion with severe pulmonary hypertension/RV failure.  Plan to repeat echo in a few days to reassess the effusion.  Would like to avoid pericardiocentesis if possible given higher risk with severe PAH.  4. Needs to ambulate => PT, cardiac rehab.   CLEGG,AMY NP-C  03/18/2014 7:16 AM  Patient seen with NP, agree with the above note.  Breathing better, remains on milrinone.  She was diuresed extensively and weight is down.  Creatinine up.  I am going to hold po Lasix today to allow creatinine to equilibrate.  She will have RHC, will then decide on Tyvaso.  She is not excited to restart it but would be willing to try again.  Will need repeat limited echo to reassess pericardial effusion now that she has had some diuresis.  Will order today.   [name] 03/18/2014 7:38 AM

## 2014-03-18 NOTE — Telephone Encounter (Signed)
Pt is currently inpatient Had heart cath today and it appears Dr Aundra Dubin would like to begin Tyvaso  Will forward to RB to advise

## 2014-03-18 NOTE — Progress Notes (Signed)
No distress. No new complaints  Filed Vitals:   03/18/14 1545 03/18/14 1550 03/18/14 1555 03/18/14 1600  BP: 100/69 93/57 105/52 86/46  Pulse: 86 80 85 83  Temp:      TempSrc:      Resp: _0 Height:      Weight:      SpO2: 94% 96% 91% 95%   NAD HEENT WNL Chest clear Distant HS, no M abd soft, +BS Ext warm without edema  I have reviewed all of today's lab results. Relevant abnormalities are discussed in the A/P section  No new CXR  IMPRESSION: PSS Severe PAH Hypotension, resolved Pericardial effusion Pleural effusion  PLAN: Cont current Rx Cards managing milrinone RHC planned  Consider Tyvaso Recheck CXR 1/05  Merton Border, MD ; [facility] service Mobile [phone].  After 5:30 PM or weekends, call [phone]

## 2014-03-18 NOTE — Telephone Encounter (Signed)
lmomtcb x1 for pt

## 2014-03-18 NOTE — Progress Notes (Signed)
CARDIAC REHAB PHASE I   PRE:  Rate/Rhythm: 88 SR    BP: sitting 91/46    SaO2: 100 1 1/2 L  MODE:  Ambulation: 620 ft   POST:  Rate/Rhythm: 103 ST    BP: sitting 98/49     SaO2: 97 2L  Tolerated fairly well. Used RW and 2L O2. C/o SOB and weakness but able to increase distance by 270 ft from Saturday. Steady. Will continue to follow. [place], [place], ACSM 03/18/2014 9:37 AM

## 2014-03-19 ENCOUNTER — Inpatient Hospital Stay (HOSPITAL_COMMUNITY): Payer: Medicare Other

## 2014-03-19 DIAGNOSIS — I272 Other secondary pulmonary hypertension: Secondary | ICD-10-CM

## 2014-03-19 LAB — BASIC METABOLIC PANEL
Anion gap: 14 (ref 5–15)
BUN: 71 mg/dL — ABNORMAL HIGH (ref 6–23)
CO2: 23 mmol/L (ref 19–32)
Calcium: 9.3 mg/dL (ref 8.4–10.5)
Chloride: 96 mEq/L (ref 96–112)
Creatinine, Ser: 1.78 mg/dL — ABNORMAL HIGH (ref 0.50–1.10)
GFR calc Af Amer: 34 mL/min — ABNORMAL LOW (ref >90)
GFR calc non Af Amer: 29 mL/min — ABNORMAL LOW (ref >90)
GLUCOSE: 129 mg/dL — AB (ref 70–99)
POTASSIUM: 3.8 mmol/L (ref 3.5–5.1)
Sodium: 133 mmol/L — ABNORMAL LOW (ref 135–145)

## 2014-03-19 LAB — POCT I-STAT 3, VENOUS BLOOD GAS (G3P V)
Acid-Base Excess: 2 mmol/L (ref 0.0–2.0)
BICARBONATE: 27.7 meq/L — AB (ref 20.0–24.0)
O2 Saturation: 67 %
TCO2: 29 mmol/L (ref 0–100)
pCO2, Ven: 47.8 mmHg (ref 45.0–50.0)
pH, Ven: 7.371 — ABNORMAL HIGH (ref 7.250–7.300)
pO2, Ven: 36 mmHg (ref 30.0–45.0)

## 2014-03-19 LAB — CBC
HCT: 28 % — ABNORMAL LOW (ref 36.0–46.0)
HEMOGLOBIN: 9.4 g/dL — AB (ref 12.0–15.0)
MCH: 27.1 pg (ref 26.0–34.0)
MCHC: 33.6 g/dL (ref 30.0–36.0)
MCV: 80.7 fL (ref 78.0–100.0)
Platelets: 187 10*3/uL (ref 150–400)
RBC: 3.47 MIL/uL — AB (ref 3.87–5.11)
RDW: 14.4 % (ref 11.5–15.5)
WBC: 5.8 10*3/uL (ref 4.0–10.5)

## 2014-03-19 LAB — POCT I-STAT 3, ART BLOOD GAS (G3+)
Acid-base deficit: 3 mmol/L — ABNORMAL HIGH (ref 0.0–2.0)
Bicarbonate: 23 mEq/L (ref 20.0–24.0)
O2 Saturation: 93 %
PCO2 ART: 43.4 mmHg (ref 35.0–45.0)
PO2 ART: 74 mmHg — AB (ref 80.0–100.0)
TCO2: 24 mmol/L (ref 0–100)
pH, Arterial: 7.331 — ABNORMAL LOW (ref 7.350–7.450)

## 2014-03-19 LAB — CARBOXYHEMOGLOBIN
CARBOXYHEMOGLOBIN: 1.4 % (ref 0.5–1.5)
METHEMOGLOBIN: 1.2 % (ref 0.0–1.5)
O2 Saturation: 77.2 %
Total hemoglobin: 9.7 g/dL — ABNORMAL LOW (ref 12.0–16.0)

## 2014-03-19 MED ORDER — ENOXAPARIN SODIUM 40 MG/0.4ML ~~LOC~~ SOLN
40.0000 mg | SUBCUTANEOUS | Status: DC
  Administered 2014-03-19 – 2014-03-21 (×3): 40 mg via SUBCUTANEOUS
  Filled 2014-03-19 (×4): qty 0.4

## 2014-03-19 MED ORDER — AMBRISENTAN 10 MG PO TABS: 10.0000 mg | ORAL_TABLET | Freq: Every day | ORAL | Status: DC

## 2014-03-19 NOTE — Telephone Encounter (Signed)
New script for Myrtis Hopping has already been done and faxed. Per Kerry Dory paperwork is processed. I ATC Almyra Free at [phone] to advise her, line rang several times no answer no voicemail. WCB. [place] Bing, CMA

## 2014-03-19 NOTE — Telephone Encounter (Signed)
I called the [facility] program at [phone] and spoke with Caryl Pina to try and figure out where we needed to send the new Rx. She states to fax the updated rx to them at [phone] to her attention and they will forward it to the proper place. The pt is currently being re-enrolled with them and they are working on getting her co-pay assistance so once this is determined they will have the rx filled with the proper pharmacy. According to hospital notes the pt has been increased to Letairis 50m once daily.  Rx has been printed and faxed to number given.   Libby-I am forwarding this message to you to follow-up on the paperwork for the Tyvaso. Thanks!.[place] Bing CMA

## 2014-03-19 NOTE — Telephone Encounter (Signed)
Yes - we will have to order this through outpt system, probably cannot get it as an inpt. She has been on it before. Please refer to the PCC's to get the paperwork process started w [facility].

## 2014-03-19 NOTE — Evaluation (Signed)
Physical Therapy Evaluation Patient Details Name: [name] MRN: [id number] DOB: January 25, 1951 Today's Date: 03/19/2014   History of Present Illness  64 yo with history of PAH and scleroderma was admitted with right-sided heart failure and large pericardial effusion. PMH - DM2, HLD, HTN, pHTN, GERD, gastroparesis, scleroderma, DJD and diastolic HF.   Clinical Impression  Pt admitted with above diagnosis. Pt currently with functional limitations due to the deficits listed below (see PT Problem List).  Pt will benefit from skilled PT to increase their independence and safety with mobility to allow discharge to the venue listed below.  Expect pt will make good progress with mobility and be able to return home.     Follow Up Recommendations No PT follow up    Equipment Recommendations  Other (comment) (to be determined)    Recommendations for Other Services       Precautions / Restrictions Precautions Precautions: None      Mobility  Bed Mobility Overal bed mobility: Needs Assistance Bed Mobility: Sit to Supine       Sit to supine: Supervision   General bed mobility comments: supervision due to lines/tubes  Transfers Overall transfer level: Needs assistance Equipment used: Pushed w/c Transfers: Sit to/from Stand Sit to Stand: Min guard            Ambulation/Gait Ambulation/Gait assistance: Min guard Ambulation Distance (Feet): 350 Feet Assistive device:  (pushing w/c) Gait Pattern/deviations: Step-through pattern;Decreased step length - right;Decreased step length - left        Stairs            Wheelchair Mobility    Modified Rankin (Stroke Patients Only)       Balance Overall balance assessment: Needs assistance Sitting-balance support: No upper extremity supported;Feet supported Sitting balance-Leahy Scale: Good     Standing balance support: No upper extremity supported Standing balance-Leahy Scale: Fair                                Pertinent Vitals/Pain Pain Assessment: No/denies pain    Home Living Family/patient expects to be discharged to:: Private residence Living Arrangements: Other (Comment) (grandchild) Available Help at Discharge: Family;Available PRN/intermittently Type of Home: House Home Access: Stairs to enter Entrance Stairs-Rails: None Entrance Stairs-Number of Steps: 2 Home Layout: One level Home Equipment: None      Prior Function Level of Independence: Independent               Hand Dominance        Extremity/Trunk Assessment   Upper Extremity Assessment: Overall WFL for tasks assessed           Lower Extremity Assessment: Generalized weakness         Communication   Communication: No difficulties  Cognition Arousal/Alertness: Awake/alert Behavior During Therapy: Flat affect Overall Cognitive Status: Within Functional Limits for tasks assessed                      General Comments      Exercises        Assessment/Plan    PT Assessment Patient needs continued PT services  PT Diagnosis Difficulty walking;Generalized weakness   PT Problem List Decreased strength;Decreased activity tolerance;Decreased balance;Decreased mobility  PT Treatment Interventions DME instruction;Gait training;Functional mobility training;Therapeutic activities;Therapeutic exercise;Patient/family education;Balance training   PT Goals (Current goals can be found in the Care Plan section) Acute Rehab PT Goals Patient Stated Goal: return home  PT Goal Formulation: With patient Time For Goal Achievement: 03/26/14 Potential to Achieve Goals: Good    Frequency Min 3X/week   Barriers to discharge Decreased caregiver support      Co-evaluation               End of Session Equipment Utilized During Treatment: Oxygen Activity Tolerance: Patient tolerated treatment well Patient left: in bed;with call bell/phone within reach;with family/visitor present            Time: 1459-1520 PT Time Calculation (min) (ACUTE ONLY): 21 min   Charges:   PT Evaluation $Initial PT Evaluation Tier I: 1 Procedure PT Treatments $Gait Training: 8-22 mins   PT G Codes:        Arthurine Oleary Apr 15, 2014, 3:37 PM  [facility] PT [phone]

## 2014-03-19 NOTE — Progress Notes (Signed)
Patient ID: [name], female   DOB: [birth year], 64 y.o.   MRN: [id number]   SUBJECTIVE: Ms. Scherer is a 64 yo female with a history of DM2, HLD, HTN, pHTN, GERD, gastroparesis, scleroderma, DJD and diastolic HF.   Of note she has been on several PAH targeted therapies and coumadin over last 9 years, but has not been reliably on therapy due to noncompliance and inability to have labs performed. She has had best response to Tyvaso (+ bosentan) with an improvement in 6 minute walk and in PASP from 102 mmHg (7/09) to 42 mmHg (9/10). After stopping Tyvaso her PASP rose to 80's by TTE 6/13 and 11/14. Surprisingly, she has never required supplemental O2. Macitentan was started in 11/14. She developed nausea and diarrhea and macitentan was stopped early 3/15. Most recently, she had been on Letairis and Adcirca (followed by Dr. Lamonte Sakai).   Admitted 10/14-10/18/15 for A/C RHF, volume overload and syncope. Diuresed with IV lasix and milrinone a total of 26 lbs. Discharge weight was 156 lbs and started on lasix 40 mg BID.   She was admitted again on 12/29 with dyspnea and chest pain.  BP was low, and Lasix and Adcirca were both initially held.  Echo showed EF 60-65% with moderately dilated and dysfunctional RV, PASP 111 mmHg with moderate TR.  There was a large pericardial effusion without definite tamponade.  Presentation was suspected to be due to progressive PAH with secondary RV failure rather than tamponade.  No pericardiocentesis yet. She was started on milrinone gtt and diuresed. Shorter-acting sildenafil was started instead of Adcirca.   RHC./LHC (1/4):  Hemodynamics (mmHg) RA mean 12 RV 64/14 PA 68/29, mean 42 PCWP mean 12 LV 102/14 AO 105/53 There was respirophasic ventricular interdependence noted with simultaneous RV and LV pressure tracings (LV systolic pressure fell as RV systolic pressure rose and vice versa).  Oxygen saturations: PA 67% AO 93% Cardiac Output (Fick) 6.63  Cardiac Index  (Fick) 3.88  PVR 4.5 WU Cardiac Output (Thermo) 5.97 Cardiac Index (Thermo) 3.49 PVR 5 WU  We attempted to continue diuresis yesterday but creatinine up to 1.78.  She feels ok overall.  CVP now 5.   Scheduled Meds: . ambrisentan  10 mg Oral Daily  . aspirin EC  81 mg Oral Daily  . fenofibrate  54 mg Oral Daily  . ferrous sulfate  325 mg Oral Q breakfast  . FLUoxetine  20 mg Oral q morning - 10a  . heparin subcutaneous  5,000 Units Subcutaneous 3 times per day  . pantoprazole  40 mg Oral Daily  . pregabalin  150 mg Oral TID  . sildenafil  20 mg Oral TID  . sodium chloride  10-40 mL Intracatheter Q12H  . sodium chloride  3 mL Intravenous Q12H  . sodium chloride  3 mL Intravenous Q12H  . sodium chloride  3 mL Intravenous Q12H   Continuous Infusions: . sodium chloride 5 mL/hr at 03/16/14 2000  . milrinone 0.25 mcg/kg/min (03/19/14 0700)   PRN Meds:.sodium chloride, sodium chloride, acetaminophen, HYDROcodone-acetaminophen, ondansetron (ZOFRAN) IV, sodium chloride, sodium chloride, sodium chloride   Filed Vitals:   03/19/14 0630 03/19/14 0636 03/19/14 0650 03/19/14 0702  BP: 77/51  84/46 99/63  Pulse: 76 83 86 88  Temp:      TempSrc:      Resp: _0 Height:      Weight:      SpO2: 97% 99% 97% 100%    Intake/Output Summary (Last  24 hours) at 03/19/14 0727 Last data filed at 03/19/14 0700  Gross per 24 hour  Intake  644.4 ml  Output   1350 ml  Net -705.6 ml    LABS: Basic Metabolic Panel:  Recent Labs  03/18/14 0440 03/19/14 0545  NA 136 133*  K 3.9 3.8  CL 100 96  CO2 26 23  GLUCOSE 133* 129*  BUN 54* 71*  CREATININE 1.58* 1.78*  CALCIUM 9.4 9.3   Liver Function Tests: No results for input(s): AST, ALT, ALKPHOS, BILITOT, PROT, ALBUMIN in the last 72 hours. No results for input(s): LIPASE, AMYLASE in the last 72 hours. CBC:  Recent Labs  03/18/14 0440 03/19/14 0545  WBC 7.0 5.8  HGB 9.7* 9.4*  HCT 28.6* 28.0*  MCV 79.4 80.7  PLT 173 187     Cardiac Enzymes: No results for input(s): CKTOTAL, CKMB, CKMBINDEX, TROPONINI in the last 72 hours. BNP: Invalid input(s): POCBNP D-Dimer: No results for input(s): DDIMER in the last 72 hours. Hemoglobin A1C: No results for input(s): HGBA1C in the last 72 hours. Fasting Lipid Panel: No results for input(s): CHOL, HDL, LDLCALC, TRIG, CHOLHDL, LDLDIRECT in the last 72 hours. Thyroid Function Tests: No results for input(s): TSH, T4TOTAL, T3FREE, THYROIDAB in the last 72 hours.  Invalid input(s): FREET3 Anemia Panel: No results for input(s): VITAMINB12, FOLATE, FERRITIN, TIBC, IRON, RETICCTPCT in the last 72 hours.  RADIOLOGY: Ct Abdomen Pelvis Wo Contrast  03/11/2014   CLINICAL DATA:  Initial evaluation for shortness of breath, abdominal pain.  EXAM: CT ABDOMEN AND PELVIS WITHOUT CONTRAST  TECHNIQUE: Multidetector CT imaging of the abdomen and pelvis was performed following the standard protocol without IV contrast.  COMPARISON:  None.  FINDINGS: Bilateral pleural effusions are partially visualized, right greater than left. There is associated bibasilar atelectasis. Moderate to large pericardial effusion is partially visualized.  Scattered cystic lesions within the right hepatic lobe are stable. Liver is otherwise unremarkable. Gallbladder within normal limits. No biliary dilatation. Spleen, adrenal glands, and pancreas demonstrate a normal unenhanced appearance.  Kidneys are equal in size without evidence of nephrolithiasis or hydronephrosis.  Distal esophagus is somewhat patulous. Stomach within normal limits. No evidence for bowel obstruction. No abnormal wall thickening or inflammatory changes seen about the bowels.  Bladder within normal limits. Uterus and ovaries are not visualized.  Small volume free fluid seen adjacent to the liver.  No free air.  Retroperitoneal adenopathy again seen, similar to prior. Again, the most prominent node is seen in the left periaortic region and 1.5 cm in  short axis (series 2, image 50, previously 1.6 cm). Left iliac node measures up to 1.3 cm in short axis (series 2, image 73). There are additional mildly prominent but not enlarged iliac nodes bilaterally. No pathologically enlarged inguinal lymph nodes identified. Overall, the degree of adenopathy is stable to slightly decreased from prior study.  No acute osseous abnormality. No worrisome lytic or blastic osseous lesions.  Mild diffuse anasarca.  IMPRESSION: 1. Moderate pericardial effusion with small bilateral pleural effusions, right greater than left, and small volume ascites. 2. Stable to slightly decreased size of retroperitoneal and iliac adenopathy as above. These nodes are indeterminate. Continued attention on followup is recommended. Additionally, correlation with PET-CT could be considered for further evaluation. 3. No other acute intra-abdominal pelvic process identified.   Electronically Signed   By: Jeannine Boga M.D.   On: 03/11/2014 23:59   Dg Chest 2 View  03/13/2014   CLINICAL DATA:  Shortness of breath.  EXAM: CHEST  2 VIEW  COMPARISON:  CT 02/15/2012.  Chest x-ray 12/26/2013.  FINDINGS: Mediastinum hilar structures are unremarkable. Persistent severe cardiomegaly. Pulmonary venous congestion. Diffuse from interstitial prominence and mild pleural effusions previous present consistent with congestive heart failure. A component of the severe cardiomegaly may be related to previously identified pericardial effusion. No pneumothorax. No acute osseus abnormality.  IMPRESSION: 1. Severe cardiomegaly with congestive heart failure. Bilateral interstitial edema and small pleural effusions are present. 2. A component of the cardiomegaly may be related to previously identified pericardial effusion.   Electronically Signed   By: [place]   On: 03/13/2014 07:57   Dg Chest 2 View  03/11/2014   CLINICAL DATA:  Chest pain and shortness of breath.  EXAM: CHEST  2 VIEW  COMPARISON:  Chest  radiograph 12/26/2013  FINDINGS: Marked cardiomegaly. Bilateral perihilar and diffuse interstitial pulmonary opacities. No definite pleural effusion or pneumothorax. Mid thoracic spine degenerative change. Prominent small bowel loops, incompletely visualized.  IMPRESSION: Cardiomegaly and mild interstitial pulmonary edema.  Prominent upper abdominal small bowel loops, incompletely visualized. Consider correlation with dedicated abdominal radiography.   Electronically Signed   By: Lovey Newcomer M.D.   On: 03/11/2014 21:15   Nm Pulmonary Perf And Vent  03/12/2014   CLINICAL DATA:  64 year old female with acute shortness of breath and chest pain. Initial encounter.  EXAM: NUCLEAR MEDICINE PERFUSION LUNG SCAN  TECHNIQUE: Perfusion images were obtained in multiple projections after intravenous injection of radiopharmaceutical.  The patient was un able to perform ventilation.  After only two image acquisitions, the patient did not want to continue with the perfusion study.  RADIOPHARMACEUTICALS:  6 mCi Tc-82mMAA  COMPARISON:  03/11/2014 abdominal CT and chest radiograph.  FINDINGS: No perfusion defects are identified.  Cardiomegaly and pericardial effusion as seen on CT attenuates left lower lung activity.  IMPRESSION: No perfusion defects identified -no evidence of pulmonary emboli.   Electronically Signed   By: JHassan RowanM.D.   On: 03/12/2014 19:04   Dg Chest Port 1 View  03/17/2014   CLINICAL DATA:  Acute respiratory failure.  EXAM: PORTABLE CHEST - 1 VIEW  COMPARISON:  03/13/2014  FINDINGS: Support apparatus: Right-sided PICC line tip low SVC.  New.  Cardiomediastinal silhouette: Moderate enlargement of the cardiopericardial silhouette, similar.  Pleura: No right-sided pleural effusion. The left pleural space is poorly evaluated secondary to the extent of cardiomegaly. Right hemidiaphragm elevation. No pneumothorax.  Lungs: Improvement in mild interstitial edema. Left greater than right bibasilar airspace disease.  Grossly similar.  Other: None  IMPRESSION: Improvement in mild interstitial edema.  Cardiomegaly.  Cannot exclude pericardial effusion.  Bibasilar opacities. Favor atelectasis. At the left lung base, infection cannot be excluded.   Electronically Signed   By: KAbigail MiyamotoM.D.   On: 03/17/2014 07:53   Dg Knee Complete 4 Views Left  02/27/2014   CLINICAL DATA:  Trauma yesterday with pain and swelling. History of left knee arthroplasty.  EXAM: LEFT KNEE - COMPLETE 4+ VIEW  COMPARISON:  12/26/2010  FINDINGS: Long-stem total knee arthroplasty. No acute fracture or dislocation. Cannot exclude small suprapatellar joint effusion. No acute hardware complication.  IMPRESSION: No acute osseous abnormality. Total knee arthroplasty. Cannot exclude small suprapatellar joint effusion.   Electronically Signed   By: KAbigail MiyamotoM.D.   On: 02/27/2014 15:18   Dg Abd Portable 1v  03/11/2014   CLINICAL DATA:  One day history of abdominal pain  EXAM: PORTABLE ABDOMEN - 1 VIEW  COMPARISON:  June 07, 2013  FINDINGS: There is moderate stool in the colon. Bowel gas pattern is unremarkable. No obstruction or free air is seen on this supine examination. There are small phleboliths in the pelvis. Liver appears prominent but stable.  IMPRESSION: Bowel gas pattern unremarkable.  Liver prominent but stable.   Electronically Signed   By: Lowella Grip M.D.   On: 03/11/2014 21:45    PHYSICAL EXAM CVP 5 General: NAD Neck: JVP not elevated, no thyromegaly or thyroid nodule.  Lungs: Crackles at bases bilaterally.  CV: RV heave.  Heart regular S1/S2, no S3/S4, 2/6 HSM LLSB.  No peripheral edema.   Abdomen: Soft, nontender, no hepatosplenomegaly, no distention.  Neurologic: Alert and oriented x 3.  Psych: Normal affect. Extremities: No clubbing or cyanosis.   TELEMETRY: Reviewed telemetry pt in NSR  ASSESSMENT AND PLAN: 64 yo with history of PAH and scleroderma was admitted with right-sided heart failure and large  pericardial effusion.  Treatment has been limited in past by multiple med intolerances and compliance issues.  1. Right-sided heart failure: RV moderately dilated/dysfunctional on echo with volume overload.  She  diuresed initially with milrinone for RV support/lowering of PA pressures, now creatinine rising.  - Hold Lasix today.   - Continue milrinone 0.25 mg for now.   2. PAH:  Severe PAH, PASP 111 mmHg by echo this admission, higher than in the past.  She had been on Letairis and Adcirca at home.  Adcirca held with low BP and sildenafil begun.  She is tolerating this so far.  RHC as above, PA pressure lower than on echo.   - Continue milrinone for RV support and lowering of PA pressure for now, would like to titrate off milrinone and onto Tyvaso if we can get this in the hospital => pharmacy to work on this today.    - Continue Letairis and sildenafil for now, increased Letairis to 10 mg daily 1/3 now that she appears diuresed.  Agree that she would be a poor IV epoprostenol candidate with med intolerances and compliance issues.  She did not like taking Tyvaso in the past but seemed to have had the best response to this agent.  Orenitram (po treprostinil) would be a consideration but trials in combination with PDE-5 inhibitors and endothelin antagonists did not show additive benefit from orenitram. As above, will see if we can start Tyvaso in hospital as we stop milrinone.  3. Pericardial effusion: Large pericardial effusion without definite tamponade on echo, but difficult to tell hemodynamic effect given severe pulmonary hypertension with RV failure.  Often see pericardial effusion with severe pulmonary hypertension/RV failure.  I did RHC/LHC yesterday with simultaneous left and right ventricular pressure tracings.  This showed respirophasic ventricular interdependence that is suggestive of possible tamponade physiology.  Difficult situation, she would be at higher risk for complications from  pericardiocentesis.  Initially, I had wanted to try diuresing her more but her creatinine is up this morning so will hold Lasix.  May end up having to do high risk pericardiocentesis.  Will follow today, possibly repeat limited echo tomorrow or Thursday.  4. Needs to ambulate => PT, cardiac rehab.   [name] 03/19/2014 7:27 AM

## 2014-03-19 NOTE — Telephone Encounter (Signed)
2 issues for her medication: Need to increase her Letairis as per the new dose for hospitalization Need to start Tyvaso; the paperwork for this should already be initiated. I suspect that this will not be started until she's an outpatient but we may be able to obtain and began before she is discharged.

## 2014-03-19 NOTE — Telephone Encounter (Signed)
Called [facility] [place], spoke with Pilar Plate.  He does not see where Lattie Haw with the [facility] is working right now.  States she "may be off or working 2nd shift." Per Pilar Plate, Almyra Free is the pharmacist working this clinic today.  Her contact # is [phone].  Spoke with Almyra Free -- see duplicate phone msg from 03/18/14 for additional information regarding Tyvaso.  Pt also called yesterday.  Spoke with pt today.  States prior to admission, she was taking Letaris 1 tablet qd.  The hospital has increased this to 2 tablets daily.  Pt received a shipment at home yesterday (medication was brought to the hospital by family) but shipment was only for 30 tablets.  Pt states she will need quantity increased to cover the 2 tablets per day.  Dr. Lamonte Sakai, please advise if you are ok with this change and if we can proceed with this.  Thank you.    Note:  Almyra Free with Heart Failure Team at [facility] is aware of above regarding Letaris.  Stated pt only had a few days of Letaris left when she was admitted but does note in pt's chart where more medication was brought in by family.  Almyra Free is aware msg sent to RB to address quantity change for Letaris.

## 2014-03-19 NOTE — Progress Notes (Signed)
No distress. No new complaints.   Filed Vitals:   03/19/14 1300 03/19/14 1400 03/19/14 1500 03/19/14 1600  BP: 103/48 91/37 79/41 95/48  Pulse: 86 84 76 82  Temp:      TempSrc:      Resp: _0 Height:      Weight:      SpO2: 100% 100% 95% 98%   NAD HEENT WNL Chest clear Distant HS, no M abd soft, +BS Ext warm without edema  I have reviewed all of today's lab results. Relevant abnormalities are discussed in the A/P section  RHC results from 01/04 noted Repeat Echo results from 01/04 noted  CXR: Sever CM. Otherwise NACPD  IMPRESSION: PSS Severe PAH - appears less severe by [place] 01/04 than by Echo 12/29 Hypotension, resolved Pericardial effusion Pleural effusion  PLAN: Cont current Rx Cards managing milrinone Cards deciding on mgmt of pericardial effusion Considering Tyvaso Transfer to SDU if can be on milrinone there  Merton Border, MD ; [facility] service Mobile [phone].  After 5:30 PM or weekends, call [phone]

## 2014-03-19 NOTE — Progress Notes (Signed)
CARDIAC REHAB PHASE I   PRE:  Rate/Rhythm: 83 SR    BP: sitting 100/53    SaO2: 98 1L  MODE:  Ambulation: 620 ft   POST:  Rate/Rhythm: 95 SR    BP: sitting 104/53     SaO2: 98 2L (? Accuracy, difficult to register)  Pt sts she feels good but gets weka with walking. Also SOB and rest x2 for increased SOB. BP/HR stable. Return to recliner. Will f/u. [phone]   [name] [place] CES, ACSM 03/19/2014 11:23 AM

## 2014-03-19 NOTE — Telephone Encounter (Signed)
i have taken care of the tyvaso i suspect dr byrum will need to give you a new script for the leteris [name]

## 2014-03-19 NOTE — Telephone Encounter (Signed)
I spoke with Almyra Free with [facility] Heart Failure Team.  Discussed below per RB regarding Tyvaso.  She verbalized understanding and is aware we will start this process today.    I spoke with [employer] who will begin working on the paperwork for this today.  Libby, once you start on this, if you are able to determine a more definitive time frame medication may reach pt, will you please call Almyra Free at [phone] to keep her updated?  Thank you.

## 2014-03-19 NOTE — Telephone Encounter (Signed)
Spoke to [employer] she is aware froms and rx has been faxed will follow up in a few days Joellen Jersey

## 2014-03-20 DIAGNOSIS — J9621 Acute and chronic respiratory failure with hypoxia: Secondary | ICD-10-CM

## 2014-03-20 LAB — BASIC METABOLIC PANEL
Anion gap: 8 (ref 5–15)
BUN: 70 mg/dL — ABNORMAL HIGH (ref 6–23)
CALCIUM: 9.5 mg/dL (ref 8.4–10.5)
CHLORIDE: 99 meq/L (ref 96–112)
CO2: 26 mmol/L (ref 19–32)
CREATININE: 1.58 mg/dL — AB (ref 0.50–1.10)
GFR calc non Af Amer: 34 mL/min — ABNORMAL LOW (ref >90)
GFR, EST AFRICAN AMERICAN: 39 mL/min — AB (ref >90)
Glucose, Bld: 124 mg/dL — ABNORMAL HIGH (ref 70–99)
POTASSIUM: 4.2 mmol/L (ref 3.5–5.1)
Sodium: 133 mmol/L — ABNORMAL LOW (ref 135–145)

## 2014-03-20 LAB — CARBOXYHEMOGLOBIN
Carboxyhemoglobin: 1.7 % — ABNORMAL HIGH (ref 0.5–1.5)
METHEMOGLOBIN: 1.1 % (ref 0.0–1.5)
O2 Saturation: 61.7 %
Total hemoglobin: 8 g/dL — ABNORMAL LOW (ref 12.0–16.0)

## 2014-03-20 MED ORDER — MILRINONE IN DEXTROSE 20 MG/100ML IV SOLN
0.1250 ug/kg/min | INTRAVENOUS | Status: DC
  Administered 2014-03-20 – 2014-03-21 (×2): 0.125 ug/kg/min via INTRAVENOUS
  Filled 2014-03-20 (×2): qty 100

## 2014-03-20 MED ORDER — FUROSEMIDE 40 MG PO TABS
40.0000 mg | ORAL_TABLET | Freq: Two times a day (BID) | ORAL | Status: DC
  Administered 2014-03-20 – 2014-03-21 (×4): 40 mg via ORAL
  Filled 2014-03-20 (×7): qty 1

## 2014-03-20 NOTE — Telephone Encounter (Signed)
Butch Penny from [facility] came to this office and was given all that was needed for the tyvaso,Dr Byrum was spken to, donna will go to the hospital and speak to this pt about getting started on the meds after she is discharged,spoke to pharmacist julie_[id number]  pharmacy and she is also aware of this process Joellen Jersey

## 2014-03-20 NOTE — Progress Notes (Signed)
No distress. No new complaints. Milrinone dose decreased 01/06  Filed Vitals:   03/20/14 1100 03/20/14 1200 03/20/14 1245 03/20/14 1300  BP: 92/56 89/51  87/44  Pulse: 83 85 84 86  Temp:   98.3 F (36.8 C)   TempSrc:   Oral   Resp: _0 Height:      Weight:      SpO2: 96% 96%  97%   NAD HEENT WNL Chest clear Distant HS, no M abd soft, +BS Ext warm without edema  I have reviewed all of today's lab results. Relevant abnormalities are discussed in the A/P section  Echo results 12/29 noted RHC results from 01/04 noted Repeat Echo results from 01/04 noted  CXR: NNF  IMPRESSION: PSS Severe PAH - appears less severe by [place] 01/04 than by Echo 12/29 Hypotension, resolved Pericardial effusion with possible mild tamponade - no pericardiocentesis Pleural effusion  PLAN: Cont current Rx Cards managing milrinone Cards deciding on mgmt of pericardial effusion Approved for Tyvaso after discharge (discussed with Dr Lamonte Sakai) Transfer to SDU if can be on milrinone there  Merton Border, MD ; [facility] service Mobile [phone].  After 5:30 PM or weekends, call [phone]

## 2014-03-20 NOTE — Telephone Encounter (Signed)
[facility]  -ann [phone] calling back needs additonal clinical

## 2014-03-20 NOTE — Progress Notes (Signed)
Patient ID: [name], female   DOB: 12-24-1950, 64 y.o.   MRN: [id number]   SUBJECTIVE: [name] is a 64 yo female with a history of DM2, HLD, HTN, pHTN, GERD, gastroparesis, scleroderma, DJD and diastolic HF.   Of note she has been on several PAH targeted therapies and coumadin over last 9 years, but has not been reliably on therapy due to noncompliance and inability to have labs performed. She has had best response to Tyvaso (+ bosentan) with an improvement in 6 minute walk and in PASP from 102 mmHg (7/09) to 42 mmHg (9/10). After stopping Tyvaso her PASP rose to 80's by TTE 6/13 and 11/14. Surprisingly, she has never required supplemental O2. Macitentan was started in 11/14. She developed nausea and diarrhea and macitentan was stopped early 3/15. Most recently, she had been on Letairis and Adcirca (followed by Dr. Lamonte Sakai).   Admitted 10/14-10/18/15 for A/C RHF, volume overload and syncope. Diuresed with IV lasix and milrinone a total of 26 lbs. Discharge weight was 156 lbs and started on lasix 40 mg BID.   She was admitted again on 12/29 with dyspnea and chest pain.  BP was low, and Lasix and Adcirca were both initially held.  Echo showed EF 60-65% with moderately dilated and dysfunctional RV, PASP 111 mmHg with moderate TR.  There was a large pericardial effusion without definite tamponade.  Presentation was suspected to be due to progressive PAH with secondary RV failure rather than tamponade.  No pericardiocentesis yet. She was started on milrinone gtt and diuresed. Shorter-acting sildenafil was started instead of Adcirca.   RHC./LHC (1/4):  Hemodynamics (mmHg) RA mean 12 RV 64/14 PA 68/29, mean 42 PCWP mean 12 LV 102/14 AO 105/53 There was respirophasic ventricular interdependence noted with simultaneous RV and LV pressure tracings (LV systolic pressure fell as RV systolic pressure rose and vice versa).  Oxygen saturations: PA 67% AO 93% Cardiac Output (Fick) 6.63  Cardiac Index  (Fick) 3.88  PVR 4.5 WU Cardiac Output (Thermo) 5.97 Cardiac Index (Thermo) 3.49 PVR 5 WU  CVP 7 today.  She walked with PT and rehab yesterday, denies lightheadedness but felt weak.  Did pretty well.   Scheduled Meds: . ambrisentan  10 mg Oral Daily  . aspirin EC  81 mg Oral Daily  . enoxaparin (LOVENOX) injection  40 mg Subcutaneous Q24H  . fenofibrate  54 mg Oral Daily  . ferrous sulfate  325 mg Oral Q breakfast  . FLUoxetine  20 mg Oral q morning - 10a  . furosemide  40 mg Oral BID  . pantoprazole  40 mg Oral Daily  . pregabalin  150 mg Oral TID  . sildenafil  20 mg Oral TID  . sodium chloride  10-40 mL Intracatheter Q12H  . sodium chloride  3 mL Intravenous Q12H  . sodium chloride  3 mL Intravenous Q12H   Continuous Infusions: . sodium chloride 5 mL/hr at 03/16/14 2000  . milrinone     PRN Meds:.sodium chloride, acetaminophen, HYDROcodone-acetaminophen, ondansetron (ZOFRAN) IV, sodium chloride, sodium chloride   Filed Vitals:   03/20/14 0428 03/20/14 0500 03/20/14 0600 03/20/14 0700  BP:  _0  Pulse:  79 81 80  Temp:      TempSrc:      Resp:  _1 Height:      Weight: 146 lb 13.2 oz (66.6 kg)     SpO2:  97% 95% 98%    Intake/Output Summary (Last 24 hours) at 03/20/14  0800 Last data filed at 03/20/14 0700  Gross per 24 hour  Intake  251.1 ml  Output   1850 ml  Net -1598.9 ml    LABS: Basic Metabolic Panel:  Recent Labs  03/19/14 0545 03/20/14 0400  NA 133* 133*  K 3.8 4.2  CL 96 99  CO2 23 26  GLUCOSE 129* 124*  BUN 71* 70*  CREATININE 1.78* 1.58*  CALCIUM 9.3 9.5   Liver Function Tests: No results for input(s): AST, ALT, ALKPHOS, BILITOT, PROT, ALBUMIN in the last 72 hours. No results for input(s): LIPASE, AMYLASE in the last 72 hours. CBC:  Recent Labs  03/18/14 0440 03/19/14 0545  WBC 7.0 5.8  HGB 9.7* 9.4*  HCT 28.6* 28.0*  MCV 79.4 80.7  PLT 173 187   Cardiac Enzymes: No results for input(s): CKTOTAL, CKMB,  CKMBINDEX, TROPONINI in the last 72 hours. BNP: Invalid input(s): POCBNP D-Dimer: No results for input(s): DDIMER in the last 72 hours. Hemoglobin A1C: No results for input(s): HGBA1C in the last 72 hours. Fasting Lipid Panel: No results for input(s): CHOL, HDL, LDLCALC, TRIG, CHOLHDL, LDLDIRECT in the last 72 hours. Thyroid Function Tests: No results for input(s): TSH, T4TOTAL, T3FREE, THYROIDAB in the last 72 hours.  Invalid input(s): FREET3 Anemia Panel: No results for input(s): VITAMINB12, FOLATE, FERRITIN, TIBC, IRON, RETICCTPCT in the last 72 hours.  RADIOLOGY: Ct Abdomen Pelvis Wo Contrast  03/11/2014   CLINICAL DATA:  Initial evaluation for shortness of breath, abdominal pain.  EXAM: CT ABDOMEN AND PELVIS WITHOUT CONTRAST  TECHNIQUE: Multidetector CT imaging of the abdomen and pelvis was performed following the standard protocol without IV contrast.  COMPARISON:  None.  FINDINGS: Bilateral pleural effusions are partially visualized, right greater than left. There is associated bibasilar atelectasis. Moderate to large pericardial effusion is partially visualized.  Scattered cystic lesions within the right hepatic lobe are stable. Liver is otherwise unremarkable. Gallbladder within normal limits. No biliary dilatation. Spleen, adrenal glands, and pancreas demonstrate a normal unenhanced appearance.  Kidneys are equal in size without evidence of nephrolithiasis or hydronephrosis.  Distal esophagus is somewhat patulous. Stomach within normal limits. No evidence for bowel obstruction. No abnormal wall thickening or inflammatory changes seen about the bowels.  Bladder within normal limits. Uterus and ovaries are not visualized.  Small volume free fluid seen adjacent to the liver.  No free air.  Retroperitoneal adenopathy again seen, similar to prior. Again, the most prominent node is seen in the left periaortic region and 1.5 cm in short axis (series 2, image 50, previously 1.6 cm). Left iliac  node measures up to 1.3 cm in short axis (series 2, image 73). There are additional mildly prominent but not enlarged iliac nodes bilaterally. No pathologically enlarged inguinal lymph nodes identified. Overall, the degree of adenopathy is stable to slightly decreased from prior study.  No acute osseous abnormality. No worrisome lytic or blastic osseous lesions.  Mild diffuse anasarca.  IMPRESSION: 1. Moderate pericardial effusion with small bilateral pleural effusions, right greater than left, and small volume ascites. 2. Stable to slightly decreased size of retroperitoneal and iliac adenopathy as above. These nodes are indeterminate. Continued attention on followup is recommended. Additionally, correlation with PET-CT could be considered for further evaluation. 3. No other acute intra-abdominal pelvic process identified.   Electronically Signed   By: Jeannine Boga M.D.   On: 03/11/2014 23:59   Dg Chest 2 View  03/13/2014   CLINICAL DATA:  Shortness of breath.  EXAM: CHEST  2  VIEW  COMPARISON:  CT 02/15/2012.  Chest x-ray 12/26/2013.  FINDINGS: Mediastinum hilar structures are unremarkable. Persistent severe cardiomegaly. Pulmonary venous congestion. Diffuse from interstitial prominence and mild pleural effusions previous present consistent with congestive heart failure. A component of the severe cardiomegaly may be related to previously identified pericardial effusion. No pneumothorax. No acute osseus abnormality.  IMPRESSION: 1. Severe cardiomegaly with congestive heart failure. Bilateral interstitial edema and small pleural effusions are present. 2. A component of the cardiomegaly may be related to previously identified pericardial effusion.   Electronically Signed   By: [place]   On: 03/13/2014 07:57   Dg Chest 2 View  03/11/2014   CLINICAL DATA:  Chest pain and shortness of breath.  EXAM: CHEST  2 VIEW  COMPARISON:  Chest radiograph 12/26/2013  FINDINGS: Marked cardiomegaly. Bilateral  perihilar and diffuse interstitial pulmonary opacities. No definite pleural effusion or pneumothorax. Mid thoracic spine degenerative change. Prominent small bowel loops, incompletely visualized.  IMPRESSION: Cardiomegaly and mild interstitial pulmonary edema.  Prominent upper abdominal small bowel loops, incompletely visualized. Consider correlation with dedicated abdominal radiography.   Electronically Signed   By: Lovey Newcomer M.D.   On: 03/11/2014 21:15   Nm Pulmonary Perf And Vent  03/12/2014   CLINICAL DATA:  64 year old female with acute shortness of breath and chest pain. Initial encounter.  EXAM: NUCLEAR MEDICINE PERFUSION LUNG SCAN  TECHNIQUE: Perfusion images were obtained in multiple projections after intravenous injection of radiopharmaceutical.  The patient was un able to perform ventilation.  After only two image acquisitions, the patient did not want to continue with the perfusion study.  RADIOPHARMACEUTICALS:  6 mCi Tc-15mMAA  COMPARISON:  03/11/2014 abdominal CT and chest radiograph.  FINDINGS: No perfusion defects are identified.  Cardiomegaly and pericardial effusion as seen on CT attenuates left lower lung activity.  IMPRESSION: No perfusion defects identified -no evidence of pulmonary emboli.   Electronically Signed   By: JHassan RowanM.D.   On: 03/12/2014 19:04   Dg Chest Port 1 View  03/19/2014   CLINICAL DATA:  64year old female with acute on chronic respiratory failure. Chronic right heart failure, chronic renal disease. Pericardial effusion. Shortness of Breath. Initial encounter.  EXAM: PORTABLE CHEST - 1 VIEW  COMPARISON:  03/17/2014 and earlier.  FINDINGS: Portable AP semi upright view at 0505 hrs. Severe cardiomegaly and mediastinal contours are stable. Since 03/11/2014, resolved pulmonary edema. Improved bibasilar ventilation. No pneumothorax, consolidation, or definite pleural effusion. Residual perihilar streaky opacity, mostly on the left, favor atelectasis. Stable right PICC  line. Visualized tracheal air column is within normal limits.  IMPRESSION: Resolved pulmonary edema since December. Stable severe cardiomegaly. Residual atelectasis.   Electronically Signed   By: LLars PinksM.D.   On: 03/19/2014 07:25   Dg Chest Port 1 View  03/17/2014   CLINICAL DATA:  Acute respiratory failure.  EXAM: PORTABLE CHEST - 1 VIEW  COMPARISON:  03/13/2014  FINDINGS: Support apparatus: Right-sided PICC line tip low SVC.  New.  Cardiomediastinal silhouette: Moderate enlargement of the cardiopericardial silhouette, similar.  Pleura: No right-sided pleural effusion. The left pleural space is poorly evaluated secondary to the extent of cardiomegaly. Right hemidiaphragm elevation. No pneumothorax.  Lungs: Improvement in mild interstitial edema. Left greater than right bibasilar airspace disease. Grossly similar.  Other: None  IMPRESSION: Improvement in mild interstitial edema.  Cardiomegaly.  Cannot exclude pericardial effusion.  Bibasilar opacities. Favor atelectasis. At the left lung base, infection cannot be excluded.   Electronically Signed  By: Abigail Miyamoto M.D.   On: 03/17/2014 07:53   Dg Knee Complete 4 Views Left  02/27/2014   CLINICAL DATA:  Trauma yesterday with pain and swelling. History of left knee arthroplasty.  EXAM: LEFT KNEE - COMPLETE 4+ VIEW  COMPARISON:  12/26/2010  FINDINGS: Long-stem total knee arthroplasty. No acute fracture or dislocation. Cannot exclude small suprapatellar joint effusion. No acute hardware complication.  IMPRESSION: No acute osseous abnormality. Total knee arthroplasty. Cannot exclude small suprapatellar joint effusion.   Electronically Signed   By: Abigail Miyamoto M.D.   On: 02/27/2014 15:18   Dg Abd Portable 1v  03/11/2014   CLINICAL DATA:  One day history of abdominal pain  EXAM: PORTABLE ABDOMEN - 1 VIEW  COMPARISON:  June 07, 2013  FINDINGS: There is moderate stool in the colon. Bowel gas pattern is unremarkable. No obstruction or free air is seen on this  supine examination. There are small phleboliths in the pelvis. Liver appears prominent but stable.  IMPRESSION: Bowel gas pattern unremarkable.  Liver prominent but stable.   Electronically Signed   By: Lowella Grip M.D.   On: 03/11/2014 21:45    PHYSICAL EXAM CVP 7 General: NAD Neck: JVP not elevated, no thyromegaly or thyroid nodule.  Lungs: Crackles at bases bilaterally.  CV: RV heave.  Heart regular S1/S2, no S3/S4, 2/6 HSM LLSB.  No peripheral edema.   Abdomen: Soft, nontender, no hepatosplenomegaly, no distention.  Neurologic: Alert and oriented x 3.  Psych: Normal affect. Extremities: No clubbing or cyanosis.   TELEMETRY: Reviewed telemetry pt in NSR  ASSESSMENT AND PLAN: 64 yo with history of PAH and scleroderma was admitted with right-sided heart failure and large pericardial effusion.  Treatment has been limited in past by multiple med intolerances and compliance issues.  1. Right-sided heart failure: RV moderately dilated/dysfunctional on echo with volume overload.  She  diuresed initially with milrinone for RV support/lowering of PA pressures, followed by rise in creatinine.  CVP 7 today, creatinine down to 1.5.  - Restart Lasix 40 mg po bid.  - Wean milrinone down to 0.125.  - Continue to walk in halls.  2. PAH:  Severe PAH, PASP 111 mmHg by echo this admission, higher than in the past.  She had been on Letairis and Adcirca at home.  Adcirca held with low BP and sildenafil begun.  She is tolerating this so far.  RHC as above, PA pressure lower than on echo.   - Starting to titrate off milrinone.   - Will not be able to start on Tyvaso as inpatient, pulmonary office working on paperwork to get Tyvaso for her.     - Continue Letairis and sildenafil for now, increased Letairis to 10 mg daily 1/3 now that she appears diuresed.  Agree that she would be a poor IV epoprostenol candidate with med intolerances and compliance issues.  She did not like taking Tyvaso in the past but  seemed to have had the best response to this agent.  Orenitram (po treprostinil) would be a consideration but trials in combination with PDE-5 inhibitors and endothelin antagonists did not show additive benefit from orenitram. As above, pulmonary office working on Tyvaso.  3. Pericardial effusion: Large pericardial effusion without definite tamponade on echo, but difficult to tell hemodynamic effect given severe pulmonary hypertension with RV failure.  Often see pericardial effusion with severe pulmonary hypertension/RV failure.  I did RHC/LHC with simultaneous left and right ventricular pressure tracings.  This showed respirophasic ventricular interdependence  that is suggestive of possible tamponade physiology.  Difficult situation, she would be at relatively high risk for complications from pericardiocentesis with severe PAH.  I will continue diuresing her gently.  Will try to titrate off milrinone.  If she does not do well symptomatically and pericardial effusion does not improve by echo, may need high risk pericardiocentesis.  Repeat limited echo Thursday or Friday.  4. To step-down.   [name] 03/20/2014 8:00 AM

## 2014-03-20 NOTE — Progress Notes (Signed)
CARDIAC REHAB PHASE I   PRE:  Rate/Rhythm: 84 SR  BP:  Supine:   Sitting: 102/58  Standing:    SaO2: 100 RA  MODE:  Ambulation: 700 ft   POST:  Rate/Rhythm: 100 SR  BP:  Supine:   Sitting: 85/40 89/45  Standing:    SaO2: 97 RA [id number] On arrival pt in recliner, O2 off states that she has had it off all night, RA sat 100%. Assisted X 1 and use walker to ambulate. Gait steady, pt able to walk 700 feet with two standing rest stops. No c/o of SOB walking, did c/o of weakness . Pt back to recliner after walk with call light in reach. BP after walk 85/40 recheck 89/45, nurse aware. Rodney Langton RN 03/20/2014 10:09 AM

## 2014-03-20 NOTE — Progress Notes (Signed)
Report given to [facility] SD RN. Pt moved with belongings to [place] [address]. Family at bedside. Pt walked to new room with no difficulty.

## 2014-03-20 NOTE — Telephone Encounter (Signed)
I spoke with Lelon Frohlich with [facility].  Had the following questions regarding Urgent Tyvaso request: 1.  Is pt still in hospital?  Advised pt is admitted. 2.  Can she call pt -- advised yes. 3.  Needing right heart cath results and needing a calcium channel blocker physician statement form signed by RB.  States [facility] rep, Butch Penny, will bring this by today.  Per Lelon Frohlich, heart cath results can be given to Butch Penny this afternoon.  Results given to LIbby as Butch Penny will be coming by her office.

## 2014-03-20 NOTE — Progress Notes (Signed)
Pt AAOx4. BP 75/38. States she feels fine. Dr. Aundra Dubin made aware. No new orders at this time will continue to monitor closely.

## 2014-03-21 LAB — CBC
HCT: 21.3 % — ABNORMAL LOW (ref 36.0–46.0)
HCT: 23.5 % — ABNORMAL LOW (ref 36.0–46.0)
HEMOGLOBIN: 8.1 g/dL — AB (ref 12.0–15.0)
Hemoglobin: 7.2 g/dL — ABNORMAL LOW (ref 12.0–15.0)
MCH: 26.7 pg (ref 26.0–34.0)
MCH: 27.7 pg (ref 26.0–34.0)
MCHC: 33.8 g/dL (ref 30.0–36.0)
MCHC: 34.5 g/dL (ref 30.0–36.0)
MCV: 78.9 fL (ref 78.0–100.0)
MCV: 80.5 fL (ref 78.0–100.0)
Platelets: 195 10*3/uL (ref 150–400)
Platelets: 216 10*3/uL (ref 150–400)
RBC: 2.7 MIL/uL — ABNORMAL LOW (ref 3.87–5.11)
RBC: 2.92 MIL/uL — AB (ref 3.87–5.11)
RDW: 14.2 % (ref 11.5–15.5)
RDW: 14.6 % (ref 11.5–15.5)
WBC: 7 10*3/uL (ref 4.0–10.5)
WBC: 7 10*3/uL (ref 4.0–10.5)

## 2014-03-21 LAB — CARBOXYHEMOGLOBIN
Carboxyhemoglobin: 2.1 % — ABNORMAL HIGH (ref 0.5–1.5)
Methemoglobin: 1.3 % (ref 0.0–1.5)
O2 Saturation: 72.2 %
Total hemoglobin: 6.2 g/dL — CL (ref 12.0–16.0)

## 2014-03-21 LAB — BASIC METABOLIC PANEL
ANION GAP: 7 (ref 5–15)
BUN: 84 mg/dL — ABNORMAL HIGH (ref 6–23)
CHLORIDE: 100 meq/L (ref 96–112)
CO2: 27 mmol/L (ref 19–32)
Calcium: 9.4 mg/dL (ref 8.4–10.5)
Creatinine, Ser: 1.79 mg/dL — ABNORMAL HIGH (ref 0.50–1.10)
GFR calc non Af Amer: 29 mL/min — ABNORMAL LOW (ref >90)
GFR, EST AFRICAN AMERICAN: 34 mL/min — AB (ref >90)
Glucose, Bld: 106 mg/dL — ABNORMAL HIGH (ref 70–99)
Potassium: 4.3 mmol/L (ref 3.5–5.1)
Sodium: 134 mmol/L — ABNORMAL LOW (ref 135–145)

## 2014-03-21 NOTE — Progress Notes (Signed)
No distress. No new complaints. Milrinone dose decreased 01/06  Filed Vitals:   03/21/14 1200 03/21/14 1310 03/21/14 1625 03/21/14 1628  BP:  81/40 103/47   Pulse:      Temp: 97.7 F (36.5 C)   98.2 F (36.8 C)  TempSrc: Oral   Oral  Resp:  13    Height:      Weight:      SpO2:    94%   NAD HEENT WNL Chest clear Distant HS, no M abd soft, +BS Ext warm without edema  I have reviewed all of today's lab results. Relevant abnormalities are discussed in the A/P section  Echo results 12/29 noted RHC results from 01/04 noted Repeat Echo results from 01/04 noted  CXR: NNF  IMPRESSION: PSS Severe PAH - appears less severe by [place] 01/04 than by Echo 12/29 Hypotension, resolved Pericardial effusion with possible mild tamponade - no pericardiocentesis Pleural effusion  PLAN: Cont current Rx Cards managing milrinone Cards deciding on mgmt of pericardial effusion Approved for Tyvaso after discharge (discussed with Dr Lamonte Sakai) Possible DC home 01/08  Merton Border, MD ; [facility] service [place] [phone].  After 5:30 PM or weekends, call [phone]

## 2014-03-21 NOTE — Progress Notes (Signed)
Patient ID: [name], female   DOB: 1951/02/23, 64 y.o.   MRN: [id number]   SUBJECTIVE: [name] is a 64 yo female with a history of DM2, HLD, HTN, pHTN, GERD, gastroparesis, scleroderma, DJD and diastolic HF.   Of note she has been on several PAH targeted therapies and coumadin over last 9 years, but has not been reliably on therapy due to noncompliance and inability to have labs performed. She has had best response to Tyvaso (+ bosentan) with an improvement in 6 minute walk and in PASP from 102 mmHg (7/09) to 42 mmHg (9/10). After stopping Tyvaso her PASP rose to 80's by TTE 6/13 and 11/14. Surprisingly, she has never required supplemental O2. Macitentan was started in 11/14. She developed nausea and diarrhea and macitentan was stopped early 3/15. Most recently, she had been on Letairis and Adcirca (followed by Dr. Lamonte [name]).   Admitted 10/14-10/18/15 for A/C RHF, volume overload and syncope. Diuresed with IV lasix and milrinone a total of 26 lbs. Discharge weight was 156 lbs and started on lasix 40 mg BID.   She was admitted again on 12/29 with dyspnea and chest pain.  BP was low, and Lasix and Adcirca were both initially held.  Echo showed EF 60-65% with moderately dilated and dysfunctional RV, PASP 111 mmHg with moderate TR.  There was a large pericardial effusion without definite tamponade.  Presentation was suspected to be due to progressive PAH with secondary RV failure rather than tamponade.  No pericardiocentesis yet. She was started on milrinone gtt and diuresed. Shorter-acting sildenafil was started instead of Adcirca.   RHC./LHC (1/4):  Hemodynamics (mmHg) RA mean 12 RV 64/14 PA 68/29, mean 42 PCWP mean 12 LV 102/14 AO 105/53 There was respirophasic ventricular interdependence noted with simultaneous RV and LV pressure tracings (LV systolic pressure fell as RV systolic pressure rose and vice versa).  Oxygen saturations: PA 67% AO 93% Cardiac Output (Fick) 6.63  Cardiac Index  (Fick) 3.88  PVR 4.5 WU Cardiac Output (Thermo) 5.97 Cardiac Index (Thermo) 3.49 PVR 5 WU  She did ok with weaning of milrinone to 0.125 yesterday.  CVP 8 this morning, co-ox 72%.  She walked with cardiac rehab, denies lightheadedness but felt weak. Hemoglobin down to 7.2 this morning, said she saw a streak of blood in stool. BP stable this morning.  She diuresed with po Lasix but creatinine up a bit.   Scheduled Meds: . ambrisentan  10 mg Oral Daily  . aspirin EC  81 mg Oral Daily  . enoxaparin (LOVENOX) injection  40 mg Subcutaneous Q24H  . fenofibrate  54 mg Oral Daily  . ferrous sulfate  325 mg Oral Q breakfast  . FLUoxetine  20 mg Oral q morning - 10a  . furosemide  40 mg Oral BID  . pantoprazole  40 mg Oral Daily  . pregabalin  150 mg Oral TID  . sildenafil  20 mg Oral TID  . sodium chloride  10-40 mL Intracatheter Q12H  . sodium chloride  3 mL Intravenous Q12H  . sodium chloride  3 mL Intravenous Q12H   Continuous Infusions: . sodium chloride 5 mL/hr at 03/16/14 2000   PRN Meds:.sodium chloride, acetaminophen, HYDROcodone-acetaminophen, ondansetron (ZOFRAN) IV, sodium chloride, sodium chloride   Filed Vitals:   03/20/14 2221 03/20/14 2300 03/21/14 0412 03/21/14 0500  BP:  97/42 95/51 113/54  Pulse:  86    Temp:  98.1 F (36.7 C) 98 F (36.7 C)   TempSrc:  Oral Oral  Resp: _0 Height:      Weight:      SpO2:  99% 99%     Intake/Output Summary (Last 24 hours) at 03/21/14 0753 Last data filed at 03/21/14 0500  Gross per 24 hour  Intake 316.98 ml  Output   1500 ml  Net -1183.02 ml    LABS: Basic Metabolic Panel:  Recent Labs  03/20/14 0400 03/21/14 0420  NA 133* 134*  K 4.2 4.3  CL 99 100  CO2 26 27  GLUCOSE 124* 106*  BUN 70* 84*  CREATININE 1.58* 1.79*  CALCIUM 9.5 9.4   Liver Function Tests: No results for input(s): AST, ALT, ALKPHOS, BILITOT, PROT, ALBUMIN in the last 72 hours. No results for input(s): LIPASE, AMYLASE in the last 72  hours. CBC:  Recent Labs  03/19/14 0545 03/21/14 0420  WBC 5.8 7.0  HGB 9.4* 7.2*  HCT 28.0* 21.3*  MCV 80.7 78.9  PLT 187 195   Cardiac Enzymes: No results for input(s): CKTOTAL, CKMB, CKMBINDEX, TROPONINI in the last 72 hours. BNP: Invalid input(s): POCBNP D-Dimer: No results for input(s): DDIMER in the last 72 hours. Hemoglobin A1C: No results for input(s): HGBA1C in the last 72 hours. Fasting Lipid Panel: No results for input(s): CHOL, HDL, LDLCALC, TRIG, CHOLHDL, LDLDIRECT in the last 72 hours. Thyroid Function Tests: No results for input(s): TSH, T4TOTAL, T3FREE, THYROIDAB in the last 72 hours.  Invalid input(s): FREET3 Anemia Panel: No results for input(s): VITAMINB12, FOLATE, FERRITIN, TIBC, IRON, RETICCTPCT in the last 72 hours.  RADIOLOGY: Ct Abdomen Pelvis Wo Contrast  03/11/2014   CLINICAL DATA:  Initial evaluation for shortness of breath, abdominal pain.  EXAM: CT ABDOMEN AND PELVIS WITHOUT CONTRAST  TECHNIQUE: Multidetector CT imaging of the abdomen and pelvis was performed following the standard protocol without IV contrast.  COMPARISON:  None.  FINDINGS: Bilateral pleural effusions are partially visualized, right greater than left. There is associated bibasilar atelectasis. Moderate to large pericardial effusion is partially visualized.  Scattered cystic lesions within the right hepatic lobe are stable. Liver is otherwise unremarkable. Gallbladder within normal limits. No biliary dilatation. Spleen, adrenal glands, and pancreas demonstrate a normal unenhanced appearance.  Kidneys are equal in size without evidence of nephrolithiasis or hydronephrosis.  Distal esophagus is somewhat patulous. Stomach within normal limits. No evidence for bowel obstruction. No abnormal wall thickening or inflammatory changes seen about the bowels.  Bladder within normal limits. Uterus and ovaries are not visualized.  Small volume free fluid seen adjacent to the liver.  No free air.   Retroperitoneal adenopathy again seen, similar to prior. Again, the most prominent node is seen in the left periaortic region and 1.5 cm in short axis (series 2, image 50, previously 1.6 cm). Left iliac node measures up to 1.3 cm in short axis (series 2, image 73). There are additional mildly prominent but not enlarged iliac nodes bilaterally. No pathologically enlarged inguinal lymph nodes identified. Overall, the degree of adenopathy is stable to slightly decreased from prior study.  No acute osseous abnormality. No worrisome lytic or blastic osseous lesions.  Mild diffuse anasarca.  IMPRESSION: 1. Moderate pericardial effusion with small bilateral pleural effusions, right greater than left, and small volume ascites. 2. Stable to slightly decreased size of retroperitoneal and iliac adenopathy as above. These nodes are indeterminate. Continued attention on followup is recommended. Additionally, correlation with PET-CT could be considered for further evaluation. 3. No other acute intra-abdominal pelvic process identified.   Electronically Signed  By: Jeannine Boga M.D.   On: 03/11/2014 23:59   Dg Chest 2 View  03/13/2014   CLINICAL DATA:  Shortness of breath.  EXAM: CHEST  2 VIEW  COMPARISON:  CT 02/15/2012.  Chest x-ray 12/26/2013.  FINDINGS: Mediastinum hilar structures are unremarkable. Persistent severe cardiomegaly. Pulmonary venous congestion. Diffuse from interstitial prominence and mild pleural effusions previous present consistent with congestive heart failure. A component of the severe cardiomegaly may be related to previously identified pericardial effusion. No pneumothorax. No acute osseus abnormality.  IMPRESSION: 1. Severe cardiomegaly with congestive heart failure. Bilateral interstitial edema and small pleural effusions are present. 2. A component of the cardiomegaly may be related to previously identified pericardial effusion.   Electronically Signed   By: [place]   On:  03/13/2014 07:57   Dg Chest 2 View  03/11/2014   CLINICAL DATA:  Chest pain and shortness of breath.  EXAM: CHEST  2 VIEW  COMPARISON:  Chest radiograph 12/26/2013  FINDINGS: Marked cardiomegaly. Bilateral perihilar and diffuse interstitial pulmonary opacities. No definite pleural effusion or pneumothorax. Mid thoracic spine degenerative change. Prominent small bowel loops, incompletely visualized.  IMPRESSION: Cardiomegaly and mild interstitial pulmonary edema.  Prominent upper abdominal small bowel loops, incompletely visualized. Consider correlation with dedicated abdominal radiography.   Electronically Signed   By: Lovey Newcomer M.D.   On: 03/11/2014 21:15   Nm Pulmonary Perf And Vent  03/12/2014   CLINICAL DATA:  64 year old female with acute shortness of breath and chest pain. Initial encounter.  EXAM: NUCLEAR MEDICINE PERFUSION LUNG SCAN  TECHNIQUE: Perfusion images were obtained in multiple projections after intravenous injection of radiopharmaceutical.  The patient was un able to perform ventilation.  After only two image acquisitions, the patient did not want to continue with the perfusion study.  RADIOPHARMACEUTICALS:  6 mCi Tc-57mMAA  COMPARISON:  03/11/2014 abdominal CT and chest radiograph.  FINDINGS: No perfusion defects are identified.  Cardiomegaly and pericardial effusion as seen on CT attenuates left lower lung activity.  IMPRESSION: No perfusion defects identified -no evidence of pulmonary emboli.   Electronically Signed   By: JHassan RowanM.D.   On: 03/12/2014 19:04   Dg Chest Port 1 View  03/19/2014   CLINICAL DATA:  64year old female with acute on chronic respiratory failure. Chronic right heart failure, chronic renal disease. Pericardial effusion. Shortness of Breath. Initial encounter.  EXAM: PORTABLE CHEST - 1 VIEW  COMPARISON:  03/17/2014 and earlier.  FINDINGS: Portable AP semi upright view at 0505 hrs. Severe cardiomegaly and mediastinal contours are stable. Since 03/11/2014,  resolved pulmonary edema. Improved bibasilar ventilation. No pneumothorax, consolidation, or definite pleural effusion. Residual perihilar streaky opacity, mostly on the left, favor atelectasis. Stable right PICC line. Visualized tracheal air column is within normal limits.  IMPRESSION: Resolved pulmonary edema since December. Stable severe cardiomegaly. Residual atelectasis.   Electronically Signed   By: LLars PinksM.D.   On: 03/19/2014 07:25   Dg Chest Port 1 View  03/17/2014   CLINICAL DATA:  Acute respiratory failure.  EXAM: PORTABLE CHEST - 1 VIEW  COMPARISON:  03/13/2014  FINDINGS: Support apparatus: Right-sided PICC line tip low SVC.  New.  Cardiomediastinal silhouette: Moderate enlargement of the cardiopericardial silhouette, similar.  Pleura: No right-sided pleural effusion. The left pleural space is poorly evaluated secondary to the extent of cardiomegaly. Right hemidiaphragm elevation. No pneumothorax.  Lungs: Improvement in mild interstitial edema. Left greater than right bibasilar airspace disease. Grossly similar.  Other: None  IMPRESSION: Improvement  in mild interstitial edema.  Cardiomegaly.  Cannot exclude pericardial effusion.  Bibasilar opacities. Favor atelectasis. At the left lung base, infection cannot be excluded.   Electronically Signed   By: Abigail Miyamoto M.D.   On: 03/17/2014 07:53   Dg Knee Complete 4 Views Left  02/27/2014   CLINICAL DATA:  Trauma yesterday with pain and swelling. History of left knee arthroplasty.  EXAM: LEFT KNEE - COMPLETE 4+ VIEW  COMPARISON:  12/26/2010  FINDINGS: Long-stem total knee arthroplasty. No acute fracture or dislocation. Cannot exclude small suprapatellar joint effusion. No acute hardware complication.  IMPRESSION: No acute osseous abnormality. Total knee arthroplasty. Cannot exclude small suprapatellar joint effusion.   Electronically Signed   By: Abigail Miyamoto M.D.   On: 02/27/2014 15:18   Dg Abd Portable 1v  03/11/2014   CLINICAL DATA:  One day  history of abdominal pain  EXAM: PORTABLE ABDOMEN - 1 VIEW  COMPARISON:  June 07, 2013  FINDINGS: There is moderate stool in the colon. Bowel gas pattern is unremarkable. No obstruction or free air is seen on this supine examination. There are small phleboliths in the pelvis. Liver appears prominent but stable.  IMPRESSION: Bowel gas pattern unremarkable.  Liver prominent but stable.   Electronically Signed   By: Lowella Grip M.D.   On: 03/11/2014 21:45    PHYSICAL EXAM CVP 8 General: NAD Neck: JVP 8-9 cm, no thyromegaly or thyroid nodule.  Lungs: Crackles at bases bilaterally.  CV: RV heave.  Heart regular S1/S2, no S3/S4, 2/6 HSM LLSB.  No peripheral edema.   Abdomen: Soft, nontender, no hepatosplenomegaly, no distention.  Neurologic: Alert and oriented x 3.  Psych: Normal affect. Extremities: No clubbing or cyanosis.   TELEMETRY: Reviewed telemetry pt in NSR  ASSESSMENT AND PLAN: 64 yo with history of PAH and scleroderma was admitted with right-sided heart failure and large pericardial effusion.  Treatment has been limited in past by multiple med intolerances and compliance issues.  1. Right-sided heart failure: RV moderately dilated/dysfunctional on echo with volume overload.  She  diuresed initially with milrinone for RV support/lowering of PA pressures, followed by rise in creatinine.  CVP 8 today, creatinine 1.79. Co-ox 72%.  - Continue Lasix 40 mg po bid.  - Stop milrinone today.   - Continue to walk in halls.  2. PAH:  Severe PAH, PASP 111 mmHg by echo this admission, higher than in the past.  She had been on Letairis and Adcirca at home.  Adcirca held with low BP and sildenafil begun.  She is tolerating this so far.  RHC as above, PA pressure lower than on echo.   - Stop milrinone today.   - Will not be able to start on Tyvaso as inpatient, pulmonary office working on paperwork to get Tyvaso for her.     - Continue Letairis and sildenafil for now, increased Letairis to 10 mg  daily 1/3 now that she appears diuresed.  Agree that she would be a poor IV epoprostenol candidate with med intolerances and compliance issues.  She did not like taking Tyvaso in the past but seemed to have had the best response to this agent.  Orenitram (po treprostinil) would be a consideration but trials in combination with PDE-5 inhibitors and endothelin antagonists did not show additive benefit from orenitram. As above, pulmonary office working on Tyvaso.  3. Pericardial effusion: Large pericardial effusion without definite tamponade on echo, but difficult to tell hemodynamic effect given severe pulmonary hypertension with RV failure.  Often see pericardial effusion with severe pulmonary hypertension/RV failure.  I did RHC/LHC with simultaneous left and right ventricular pressure tracings.  This showed respirophasic ventricular interdependence that is suggestive of possible tamponade physiology.  Difficult situation, she would be at relatively high risk for complications from pericardiocentesis with severe PAH.  I will continue diuresing her gently.  I will stop milrinone today.  If she does not do well symptomatically and pericardial effusion does not improve by echo, may need high risk pericardiocentesis.   4. Anemia: Hemoglobin down to 7.2 today.  She has chronic anemia likely due to chronic disease/renal disease.  Will repeat CBC this afternoon, transfuse hemoglobin < 7.  Will check FOBT.    [name] 03/21/2014 7:53 AM

## 2014-03-21 NOTE — Progress Notes (Signed)
CARDIAC REHAB PHASE I   PRE:  Rate/Rhythm: 79 SR  BP:  Supine:   Sitting: 95/46  Standing:    SaO2: 99 RA  MODE:  Ambulation: 740 ft   POST:  Rate/Rhythm: 88 SR  BP:  Supine:   Sitting: 116/46  Standing:    SaO2: 97 RA 1145-1215 Assisted X 1 and used walker to ambulate. Gait steady with walker. Pt able to walk 740 feet without c/o of pain or SOB. VS stable Pt back to recliner after walk with call light in reach.  Rodney Langton RN 03/21/2014 12:13 PM

## 2014-03-22 DIAGNOSIS — D500 Iron deficiency anemia secondary to blood loss (chronic): Secondary | ICD-10-CM

## 2014-03-22 DIAGNOSIS — I272 Pulmonary hypertension, unspecified: Secondary | ICD-10-CM | POA: Insufficient documentation

## 2014-03-22 DIAGNOSIS — D62 Acute posthemorrhagic anemia: Secondary | ICD-10-CM

## 2014-03-22 DIAGNOSIS — K922 Gastrointestinal hemorrhage, unspecified: Secondary | ICD-10-CM

## 2014-03-22 LAB — OCCULT BLOOD X 1 CARD TO LAB, STOOL: FECAL OCCULT BLD: NEGATIVE

## 2014-03-22 LAB — BASIC METABOLIC PANEL
Anion gap: 12 (ref 5–15)
BUN: 86 mg/dL — ABNORMAL HIGH (ref 6–23)
CO2: 21 mmol/L (ref 19–32)
Calcium: 8.6 mg/dL (ref 8.4–10.5)
Chloride: 99 mEq/L (ref 96–112)
Creatinine, Ser: 1.93 mg/dL — ABNORMAL HIGH (ref 0.50–1.10)
GFR calc Af Amer: 31 mL/min — ABNORMAL LOW (ref >90)
GFR, EST NON AFRICAN AMERICAN: 27 mL/min — AB (ref >90)
Glucose, Bld: 181 mg/dL — ABNORMAL HIGH (ref 70–99)
POTASSIUM: 4 mmol/L (ref 3.5–5.1)
Sodium: 132 mmol/L — ABNORMAL LOW (ref 135–145)

## 2014-03-22 LAB — CBC
HCT: 16.9 % — ABNORMAL LOW (ref 36.0–46.0)
HCT: 22.8 % — ABNORMAL LOW (ref 36.0–46.0)
HEMOGLOBIN: 7.7 g/dL — AB (ref 12.0–15.0)
Hemoglobin: 5.9 g/dL — CL (ref 12.0–15.0)
MCH: 28.5 pg (ref 26.0–34.0)
MCH: 27.2 pg (ref 26.0–34.0)
MCHC: 34.9 g/dL (ref 30.0–36.0)
MCHC: 33.8 g/dL (ref 30.0–36.0)
MCV: 81.6 fL (ref 78.0–100.0)
MCV: 80.6 fL (ref 78.0–100.0)
PLATELETS: 193 10*3/uL (ref 150–400)
Platelets: 219 10*3/uL (ref 150–400)
RBC: 2.07 MIL/uL — AB (ref 3.87–5.11)
RBC: 2.83 MIL/uL — ABNORMAL LOW (ref 3.87–5.11)
RDW: 14.8 % (ref 11.5–15.5)
RDW: 14.5 % (ref 11.5–15.5)
WBC: 9.9 10*3/uL (ref 4.0–10.5)
WBC: 11 10*3/uL — ABNORMAL HIGH (ref 4.0–10.5)

## 2014-03-22 LAB — CARBOXYHEMOGLOBIN
Carboxyhemoglobin: 1.7 % — ABNORMAL HIGH (ref 0.5–1.5)
Methemoglobin: 0.8 % (ref 0.0–1.5)
O2 SAT: 56.3 %
Total hemoglobin: 5.4 g/dL — CL (ref 12.0–16.0)

## 2014-03-22 LAB — PREPARE RBC (CROSSMATCH)

## 2014-03-22 MED ORDER — ONDANSETRON HCL 4 MG/2ML IJ SOLN: 4.0000 mg | Freq: Once | INTRAMUSCULAR | Status: DC

## 2014-03-22 MED ORDER — SODIUM CHLORIDE 0.9 % IV SOLN
Freq: Once | INTRAVENOUS | Status: AC
Start: 2014-03-22 — End: 2014-03-24
  Administered 2014-03-24: 06:00:00 via INTRAVENOUS

## 2014-03-22 MED ORDER — ALTEPLASE 2 MG IJ SOLR
2.0000 mg | Freq: Once | INTRAMUSCULAR | Status: AC
  Administered 2014-03-22: 2 mg
  Filled 2014-03-22 (×2): qty 2

## 2014-03-22 MED ORDER — PANTOPRAZOLE SODIUM 40 MG PO TBEC
40.0000 mg | DELAYED_RELEASE_TABLET | Freq: Two times a day (BID) | ORAL | Status: DC
  Administered 2014-03-22 – 2014-03-23 (×3): 40 mg via ORAL
  Filled 2014-03-22 (×3): qty 1

## 2014-03-22 MED ORDER — SODIUM CHLORIDE 0.9 % IV SOLN
Freq: Once | INTRAVENOUS | Status: AC
  Administered 2014-03-22: 08:00:00 via INTRAVENOUS

## 2014-03-22 NOTE — Progress Notes (Addendum)
Multiple bloody BMs over past 24 hrs. No resp distress. Feels weaker. Otherwise no new complaints. Received 2 units PRBCs this AM for Hgb 5.9  Filed Vitals:   03/22/14 1500 03/22/14 1511 03/22/14 1534 03/22/14 1600  BP: 89/46 87/53  92/50  Pulse: 78 76 80 79  Temp:  98 F (36.7 C) 99.1 F (37.3 C)   TempSrc:  Oral Oral   Resp: _0 Height:      Weight:      SpO2: 95% 97% 99% 96%   NAD HEENT WNL Chest clear Distant HS, no M abd soft, +BS Ext warm without edema  I have reviewed all of today's lab results. Relevant abnormalities are discussed in the A/P section  Echo results 12/29 noted RHC results from 01/04 noted Repeat Echo results from 01/04 noted  CXR: NNF  IMPRESSION: PSS Severe PAH - improved on  RHC 01/04 than by Echo 12/29  Off Milrinone 01/07 Hypotension, resolved Pericardial effusion without evidence of tamponade   no pericardiocentesis indicated per Cards Pleural effusion, resolved Acute blood loss anemia  S/p 2 units PRBCs  Apparent GIB 01/08  GI consult 01/08  PLAN: Cont current Rx Cont to monitor in SDU DC home delayed due to GIB/GI eval DC LMWH (DVT px) F/U CBC after transfusion Approved for Tyvaso after discharge (discussed with Dr Lamonte Sakai)  Merton Border, MD ; [facility] service [place] [phone].  After 5:30 PM or weekends, call [phone]

## 2014-03-22 NOTE — Progress Notes (Signed)
PT Cancellation Note  Patient Details Name: [name] MRN: [id number] DOB: [birth year]   Cancelled Treatment:    Reason Eval/Treat Not Completed: Medical issues which prohibited therapy (Pt with GI bleed and decr Hgb)   Moniqua Engebretsen 03/22/2014, 11:38 AM  Suanne Marker PT [phone]

## 2014-03-22 NOTE — Progress Notes (Signed)
eLink Physician-Brief Progress Note Patient Name: [name] DOB: Mar 12, 1951 MRN: [id number]   Date of Service  03/22/2014  HPI/Events of Note  Blood clots in stool Hb 5.9  eICU Interventions  2 U PRBC Dc lovenox , use SCDs     Intervention Category Intermediate Interventions: Bleeding - evaluation and treatment with blood products  [name] V. 03/22/2014, 4:39 AM

## 2014-03-22 NOTE — Progress Notes (Signed)
1500 Cardiac Rehab Pt has had bloody stools today and Hgb 5.9. Ambulation held today, we will continue to follow. Deon Pilling, RN 03/22/2014 3:04 PM

## 2014-03-22 NOTE — Consult Note (Addendum)
[facility] Consult: 12:08 PM 03/22/2014  LOS: 11 days    Referring Provider: Dr Aundra Dubin.  Primary Care Physician:  Gilles Chiquito, MD Primary Gastroenterologist:  Dr. Deatra Ina   Reason for Consultation:  Melena/burgundy stool   HPI: [name] is a 64 y.o. female.  Medical problems include scleroderma/systemic sclerosis, severe pulmonary hypertension. 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 Type II diabetic with chronic kidney disease.  Dr. Lamonte Sakai follows her for her pulmonany hypertension.  She takes oral iron supplementation.  Her only "blood thinner" is 81 mg aspirin.  Patient was admitted nearly 2 weeks ago with dyspnea and chest pain. A pericardial effusion was noted. However the presentation is suspected due to progressive pulmonary artery hypertension and secondary right ventricular failure. She has been diuresed and started on milrinone drip. Sildenafil was started to replace Adcirca  Last night she had a burgundy, dark stool.  6 AM she had another which nurse describes as clotted, not bright red.  [name] hemoglobin has dropped to 5.9 today down from 9.7 and 9.4 within the last 3 days. 2 units packed red blood cells ordered.  Her baseline hemoglobin looks to be about 9-10.  MCV is normal, platelets and white blood cell count are normal.  As of 5 days ago the PT/INR were normal at 15.2/1.1.  Her BUN has risen from the mid 50s up to 86 and her creatinine has risen from 1.5 up to 1.9.  Blood thinning medications reviewed in the [facility].  she was on Lovenox but this was last dosed yesterday and has now been discontinued. Last subcutaneous heparin was 4 days ago.  [name] last visit with Dr. Deatra Ina was in mid September 2015 in order to follow-up her microscopic colitis with  diarrhea/fecal spotting.  She had been started on Lialda following her colonoscopy in July. It was unclear whether or not she was taking this medication.  Dr. Deatra Ina advised her to continue fiber supplementation. He did not start her back on the Lialda. Pt says she sees blodd per rectum about once per month, sees blood with her brown BMs.  Not deep red stool as is having now.  No current abdominal pain or nausea.  Not a great historian.     ENDOSCOPIC STUDIES: 09/18/13  Colonoscopy For fecal incontinence Faint erythema throughout the colon. AVMs seen throughout the colon but more prominent on the left. Internal hemorrhoids. Pathology: Showed microscopic colitis  11/2011 colonoscopy   02/2010 EGD to third portion of duodenum For dysphagia and history scleroderma. Megaesophagus noted throughout the entire esophagus. No stricture noted but. Maloney dilatation performed.  08/2008 EGD by Dr. Jim Desanctis. For complaint of abdominal pain. Decreased esophageal peristalsis noted but grossly normal mucosa of the esophagus, stomach and small bowel.  08/2008 colonoscopy For abdominal pain. Internal hemorrhoids noted. Otherwise normal study.  1994 EGD. By Dr. Deatra Ina Noted gastritis/esophagitis.  Past Medical History  Diagnosis Date  . Secondary pulmonary hypertension     right heart cath 04/20/04  . Cough   . Allergic rhinitis, cause unspecified   .  Esophageal reflux   . Systemic sclerosis   . Unspecified essential hypertension   . Gastritis   . Sickle cell trait     "trace"  . Obesity   . Visual changes   . Scleroderma   . Diastolic dysfunction   . Trichomonas   . Vaginal bleeding   . Neuropathy   . CHF (congestive heart failure)   . Type II diabetes mellitus     DIET CONTROL   . Bursitis   . History of blood transfusion     "think it was related to when I had partial hysteretomy; might have been for one of my knee ORs"  . Degeneration of lumbar or lumbosacral intervertebral disc     . Arthritis     "all over my body" (08/04/2013)  . Chronic kidney disease     "been in hospital for it; don't know what kind" (08/04/2013)    Past Surgical History  Procedure Laterality Date  . Tubal ligation    . Esophagogastroduodenoscopy  02/23/2010    multiple  . Shoulder arthroscopy Right 12/2009    subacromial decompression  . Replacement total knee Left 11/2000  . Cardiac catheterization Right 04/24/2004  . Tonsillectomy  1960's  . Abdominal hysterectomy  1983    "partial"  . Joint replacement    . Shoulder arthroscopy w/ rotator cuff repair Right 01/2010  . Excisional total knee arthroplasty with antibiotic spacers Left 08/2010    "got infected & had to take 1st replacement out"  . Revision total knee arthroplasty Left 11/2010    "removed spacers; replaced knee"  . Total knee revision with scar debridement/patella revision with poly exchange Left 12/2010    fell; knee split opened; had to redo revision"  . Cardiac catheterization Left 07/2010  . Peripherally inserted central catheter insertion  09/2010  . Colonoscopy  09/06/2008    normal----Dr. Lajoyce Corners   . Right heart catheterization N/A 03/18/2014    Procedure: RIGHT HEART CATH;  Surgeon: Larey Dresser, MD;  Location: [facility] CATH LAB;  Service: Cardiovascular;  Laterality: N/A;    Prior to Admission medications   Medication Sig Start Date End Date Taking? Authorizing Provider  ambrisentan (LETAIRIS) 5 MG tablet Take 5 mg by mouth daily.   Yes Historical Provider, MD  aspirin EC 81 MG EC tablet Take 1 tablet (81 mg total) by mouth daily. 06/14/13  Yes Rande Brunt, NP  esomeprazole (NEXIUM) 40 MG capsule Take by mouth 2 (two) times daily.     Yes Historical Provider, MD  fenofibrate (TRICOR) 48 MG tablet Take 48 mg by mouth daily.   Yes Historical Provider, MD  ferrous sulfate ([facility] IRON) 325 (65 FE) MG tablet Take 325 mg by mouth daily with breakfast.   Yes Historical Provider, MD  FLUoxetine (PROZAC) 20 MG capsule take 1 capsule  by mouth every morning 01/14/14  Yes Sid Falcon, MD  furosemide (LASIX) 40 MG tablet Take 1 tablet (40 mg total) by mouth 2 (two) times daily. 01/17/14  Yes Rande Brunt, NP  gabapentin (NEURONTIN) 100 MG capsule Take 1 capsule (100 mg total) by mouth 2 (two) times daily. 08/17/13  Yes Dennie Bible, NP  HYDROcodone-acetaminophen [facility]) 10-325 MG per tablet Take 1 tablet by mouth every 6 (six) hours as needed for moderate pain.  07/17/12  Yes Historical Provider, MD  pregabalin (LYRICA) 150 MG capsule Take 1 capsule (150 mg total) by mouth 3 (three) times daily. 08/17/13  Yes Dennie Bible,  NP  promethazine (PHENERGAN) 12.5 MG tablet Take 1 tablet (12.5 mg total) by mouth every 6 (six) hours as needed for nausea or vomiting. 02/27/14  Yes Sid Falcon, MD  Tadalafil, PAH, (ADCIRCA) 20 MG TABS Take 40 mg by mouth daily.   Yes Historical Provider, MD  ambrisentan (LETAIRIS) 10 MG tablet Take 1 tablet (10 mg total) by mouth daily. 03/19/14   Collene Gobble, MD    Scheduled Meds: . sodium chloride   Intravenous Once  . ambrisentan  10 mg Oral Daily  . fenofibrate  54 mg Oral Daily  . FLUoxetine  20 mg Oral q morning - 10a  . ondansetron (ZOFRAN) IV  4 mg Intravenous Once  . pantoprazole  40 mg Oral BID  . pregabalin  150 mg Oral TID  . sildenafil  20 mg Oral TID  . sodium chloride  10-40 mL Intracatheter Q12H   Infusions: . sodium chloride 5 mL/hr at 03/16/14 2000   PRN Meds: acetaminophen, HYDROcodone-acetaminophen, ondansetron (ZOFRAN) IV, sodium chloride   Allergies as of 03/11/2014 - Review Complete 03/11/2014  Allergen Reaction Noted  . Cephalexin Rash 05/01/2010  . Ciprofloxacin Rash 07/23/2010  . Codeine Other (See Comments)   . Contrast media [iodinated diagnostic agents] Hives 08/03/2011  . Iohexol Hives 03/04/2008    Family History  Problem Relation Age of Onset  . Heart disease Mother   . Diabetes Mother   . Diabetes Sister     History   Social  History  . Marital Status: Legally Separated    Spouse Name: N/A    Number of Children: 2  . Years of Education: 11   Occupational History  . Umemployed   . Disability     Social History Main Topics  . Smoking status: Never Smoker   . Smokeless tobacco: Never Used  . Alcohol Use: No  . Drug Use: No  . Sexual Activity: Not on file   Other Topics Concern  . Not on file   Social History Narrative    FAMILY HISTORY:  Significant for coronary artery disease and diabetes   Patient lives at home with granddaughter.    Patient has 2 children.    Patient has 11 years of education.    Patient is on disability.    Patient is right handed.    Patient is separated.      REVIEW OF SYSTEMS: Constitutional: not a lot of energy or activity ENT:  No nose bleeds Pulm:  SOB is better, no cough CV:  No palpitations, no LE edema.  GU:  No hematuria, no frequency GI:  Able to swallow solids, though it feels tight going down esophagus.  Has not had any food get stuck Heme:  Per HPI   Transfusions:  Per HPI Neuro:  No headaches, no peripheral tingling or numbness Derm:  No itching, no rash or sores.  Endocrine:  No sweats or chills.  No polyuria or dysuria Immunization:  Up to date on flu, pneumovax.  Travel:  None beyond local counties in last few months.    PHYSICAL EXAM: Vital signs in last 24 hours: Filed Vitals:   03/22/14 1148  BP: 85/47  Pulse: 84  Temp: 98 F (36.7 C)  Resp: 17   Wt Readings from Last 3 Encounters:  03/22/14 142 lb 13.7 oz (64.8 kg)  02/27/14 164 lb 6.4 oz (74.571 kg)  01/25/14 169 lb 4.8 oz (76.794 kg)    General: ill looking, frail.  comfortable Head:  No asymmetry or edema  Eyes:  No icterus, no conj pallor Ears:  Not HOH  Nose:  No discharge or congestion Mouth:  Clear, moist, no blood or lesions Neck:  No mass Lungs:  Clear bil but very reduced overall.   Not dyspneic, not coughing. Heart: RRR.  No MRG Abdomen:  Soft, NT, ND.  Old and healed  lower midline scar.   Rectal: burgundy smooth, soft stool is grossly bloody.  Internal hemorrhoid with external tab, not tender or obviously bleeding   Musc/Skeltl: no joint swelling or contracture Extremities:  No LE edema  Neurologic:  Answers appropriately though groggy.  Oriented x 3.  Moves all 4s Skin:  No rash or sores Tattoos:  none Nodes:  No cervical adenopathy.    Psych:  Flat affect.  Not agitated  Intake/Output from previous day: 01/07 0701 - 01/08 0700 In: -  Out: 1800 [Urine:1800] Intake/Output this shift: Total I/O In: 333 [Blood:333] Out: 450 [Urine:450]  LAB RESULTS:  Recent Labs  03/21/14 0420 03/21/14 1400 03/22/14 0412  WBC 7.0 7.0 9.9  HGB 7.2* 8.1* 5.9*  HCT 21.3* 23.5* 16.9*  PLT 195 216 193   BMET Lab Results  Component Value Date   NA 132* 03/22/2014   NA 134* 03/21/2014   NA 133* 03/20/2014   K 4.0 03/22/2014   K 4.3 03/21/2014   K 4.2 03/20/2014   CL 99 03/22/2014   CL 100 03/21/2014   CL 99 03/20/2014   CO2 21 03/22/2014   CO2 27 03/21/2014   CO2 26 03/20/2014   GLUCOSE 181* 03/22/2014   GLUCOSE 106* 03/21/2014   GLUCOSE 124* 03/20/2014   BUN 86* 03/22/2014   BUN 84* 03/21/2014   BUN 70* 03/20/2014   CREATININE 1.93* 03/22/2014   CREATININE 1.79* 03/21/2014   CREATININE 1.58* 03/20/2014   CALCIUM 8.6 03/22/2014   CALCIUM 9.4 03/21/2014   CALCIUM 9.5 03/20/2014   LFT No results for input(s): PROT, ALBUMIN, AST, ALT, ALKPHOS, BILITOT, BILIDIR, IBILI in the last 72 hours. PT/INR Lab Results  Component Value Date   INR 1.19 03/17/2014   INR 1.46 12/26/2013   INR 1.68* 06/08/2013   PROTIME 18.7 08/20/2008   Lipase     Component Value Date/Time   LIPASE 15 06/07/2013 1845    Drugs of Abuse     Component Value Date/Time   LABOPIA POSITIVE* 10/02/2010 1128   COCAINSCRNUR NONE DETECTED 10/02/2010 1128   LABBENZ NONE DETECTED 10/02/2010 1128   AMPHETMU NONE DETECTED 10/02/2010 1128   THCU NONE DETECTED 10/02/2010  1128   LABBARB NONE DETECTED 10/02/2010 1128       IMPRESSION:   *  GI bleed.  Hard to say if this is upper or lower source. Has multiple colon AVMs on colonoscopy in 09/2013. Microscopic colitis, int rrhoids on 7/15 colonoscopy. 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    *  ABL anemia.  Transfusing now, will get 2 units total. .   *  Severe pulm htn with right heart failure  *  Scleroderma.     PLAN:     *  Agree with holding Lovenox.  Decision re endoscopic eval per Dr Fuller Plan.  *  I went ahead and stopped oral Iron to eliminate any confusion on coloration of stools.   *  Continue BID Protonix. On Nexium at home.    Azucena Freed  03/22/2014, 12:08 PM Pager: [phone]      Attending physician's note   I have taken a history, examined  the patient and reviewed the chart. I agree with the Advanced Practitioner's note, impression and recommendations. Very complicated patient with multiple comorbidities with an acute GI bleed and post hemorrhagic anemia. Suspect LGI bleed from known colonic AVMs. R/O UGI bleed with EGD tomorrow. Avoid Lovenox and other anticoagulants for now. PPI BID for now. The risks, benefits, and alternatives to endoscopy with possible biopsy and possible dilation were discussed with the patient and they consent to proceed.    Ladene Artist, MD Marval Regal

## 2014-03-22 NOTE — Progress Notes (Signed)
Patient ID: AVYONNA WAGONER, female   DOB: 08/14/1950, 64 y.o.   MRN: [id number]   SUBJECTIVE: Ms. Pelly is a 64 yo female with a history of DM2, HLD, HTN, pHTN, GERD, gastroparesis, scleroderma, DJD and diastolic HF.   Of note she has been on several PAH targeted therapies and coumadin over last 9 years, but has not been reliably on therapy due to noncompliance and inability to have labs performed. She has had best response to Tyvaso (+ bosentan) with an improvement in 6 minute walk and in PASP from 102 mmHg (7/09) to 42 mmHg (9/10). After stopping Tyvaso her PASP rose to 80's by TTE 6/13 and 11/14. Surprisingly, she has never required supplemental O2. Macitentan was started in 11/14. She developed nausea and diarrhea and macitentan was stopped early 3/15. Most recently, she had been on Letairis and Adcirca (followed by Dr. Lamonte Sakai).   Admitted 10/14-10/18/15 for A/C RHF, volume overload and syncope. Diuresed with IV lasix and milrinone a total of 26 lbs. Discharge weight was 156 lbs and started on lasix 40 mg BID.   She was admitted again on 12/29 with dyspnea and chest pain.  BP was low, and Lasix and Adcirca were both initially held.  Echo showed EF 60-65% with moderately dilated and dysfunctional RV, PASP 111 mmHg with moderate TR.  There was a large pericardial effusion without definite tamponade.  Presentation was suspected to be due to progressive PAH with secondary RV failure rather than tamponade.  No pericardiocentesis yet. She was started on milrinone gtt and diuresed. Shorter-acting sildenafil was started instead of Adcirca.   RHC./LHC (1/4):  Hemodynamics (mmHg) RA mean 12 RV 64/14 PA 68/29, mean 42 PCWP mean 12 LV 102/14 AO 105/53 There was respirophasic ventricular interdependence noted with simultaneous RV and LV pressure tracings (LV systolic pressure fell as RV systolic pressure rose and vice versa).  Oxygen saturations: PA 67% AO 93% Cardiac Output (Fick) 6.63  Cardiac Index  (Fick) 3.88  PVR 4.5 WU Cardiac Output (Thermo) 5.97 Cardiac Index (Thermo) 3.49 PVR 5 WU  Milrinone stopped 1/7.  Last night, started to note clots in stool and hemoglobin down to 6 this morning. She denies lightheadedness, last BP 85/64.  She is getting 2 units PRBCs.    Scheduled Meds: . sodium chloride   Intravenous Once  . ambrisentan  10 mg Oral Daily  . fenofibrate  54 mg Oral Daily  . ferrous sulfate  325 mg Oral Q breakfast  . FLUoxetine  20 mg Oral q morning - 10a  . ondansetron (ZOFRAN) IV  4 mg Intravenous Once  . pantoprazole  40 mg Oral Daily  . pregabalin  150 mg Oral TID  . sildenafil  20 mg Oral TID  . sodium chloride  10-40 mL Intracatheter Q12H   Continuous Infusions: . sodium chloride 5 mL/hr at 03/16/14 2000   PRN Meds:.acetaminophen, HYDROcodone-acetaminophen, ondansetron (ZOFRAN) IV, sodium chloride   Filed Vitals:   03/22/14 0800 03/22/14 0819 03/22/14 0845 03/22/14 0903  BP:  _0  Pulse: 83  84 83  Temp:  98.8 F (37.1 C) 98.8 F (37.1 C) 98 F (36.7 C)  TempSrc:  Oral Oral Oral  Resp:  _1 Height:      Weight:      SpO2:  97% 92% 97%    Intake/Output Summary (Last 24 hours) at 03/22/14 1013 Last data filed at 03/22/14 0848  Gross per 24 hour  Intake     30  ml  Output   1800 ml  Net  -1770 ml    LABS: Basic Metabolic Panel:  Recent Labs  03/21/14 0420 03/22/14 0335  NA 134* 132*  K 4.3 4.0  CL 100 99  CO2 27 21  GLUCOSE 106* 181*  BUN 84* 86*  CREATININE 1.79* 1.93*  CALCIUM 9.4 8.6   Liver Function Tests: No results for input(s): AST, ALT, ALKPHOS, BILITOT, PROT, ALBUMIN in the last 72 hours. No results for input(s): LIPASE, AMYLASE in the last 72 hours. CBC:  Recent Labs  03/21/14 1400 03/22/14 0412  WBC 7.0 9.9  HGB 8.1* 5.9*  HCT 23.5* 16.9*  MCV 80.5 81.6  PLT 216 193   Cardiac Enzymes: No results for input(s): CKTOTAL, CKMB, CKMBINDEX, TROPONINI in the last 72 hours. BNP: Invalid  input(s): POCBNP D-Dimer: No results for input(s): DDIMER in the last 72 hours. Hemoglobin A1C: No results for input(s): HGBA1C in the last 72 hours. Fasting Lipid Panel: No results for input(s): CHOL, HDL, LDLCALC, TRIG, CHOLHDL, LDLDIRECT in the last 72 hours. Thyroid Function Tests: No results for input(s): TSH, T4TOTAL, T3FREE, THYROIDAB in the last 72 hours.  Invalid input(s): FREET3 Anemia Panel: No results for input(s): VITAMINB12, FOLATE, FERRITIN, TIBC, IRON, RETICCTPCT in the last 72 hours.  RADIOLOGY: Ct Abdomen Pelvis Wo Contrast  03/11/2014   CLINICAL DATA:  Initial evaluation for shortness of breath, abdominal pain.  EXAM: CT ABDOMEN AND PELVIS WITHOUT CONTRAST  TECHNIQUE: Multidetector CT imaging of the abdomen and pelvis was performed following the standard protocol without IV contrast.  COMPARISON:  None.  FINDINGS: Bilateral pleural effusions are partially visualized, right greater than left. There is associated bibasilar atelectasis. Moderate to large pericardial effusion is partially visualized.  Scattered cystic lesions within the right hepatic lobe are stable. Liver is otherwise unremarkable. Gallbladder within normal limits. No biliary dilatation. Spleen, adrenal glands, and pancreas demonstrate a normal unenhanced appearance.  Kidneys are equal in size without evidence of nephrolithiasis or hydronephrosis.  Distal esophagus is somewhat patulous. Stomach within normal limits. No evidence for bowel obstruction. No abnormal wall thickening or inflammatory changes seen about the bowels.  Bladder within normal limits. Uterus and ovaries are not visualized.  Small volume free fluid seen adjacent to the liver.  No free air.  Retroperitoneal adenopathy again seen, similar to prior. Again, the most prominent node is seen in the left periaortic region and 1.5 cm in short axis (series 2, image 50, previously 1.6 cm). Left iliac node measures up to 1.3 cm in short axis (series 2, image  73). There are additional mildly prominent but not enlarged iliac nodes bilaterally. No pathologically enlarged inguinal lymph nodes identified. Overall, the degree of adenopathy is stable to slightly decreased from prior study.  No acute osseous abnormality. No worrisome lytic or blastic osseous lesions.  Mild diffuse anasarca.  IMPRESSION: 1. Moderate pericardial effusion with small bilateral pleural effusions, right greater than left, and small volume ascites. 2. Stable to slightly decreased size of retroperitoneal and iliac adenopathy as above. These nodes are indeterminate. Continued attention on followup is recommended. Additionally, correlation with PET-CT could be considered for further evaluation. 3. No other acute intra-abdominal pelvic process identified.   Electronically Signed   By: Jeannine Boga M.D.   On: 03/11/2014 23:59   Dg Chest 2 View  03/13/2014   CLINICAL DATA:  Shortness of breath.  EXAM: CHEST  2 VIEW  COMPARISON:  CT 02/15/2012.  Chest x-ray 12/26/2013.  FINDINGS: Mediastinum hilar structures  are unremarkable. Persistent severe cardiomegaly. Pulmonary venous congestion. Diffuse from interstitial prominence and mild pleural effusions previous present consistent with congestive heart failure. A component of the severe cardiomegaly may be related to previously identified pericardial effusion. No pneumothorax. No acute osseus abnormality.  IMPRESSION: 1. Severe cardiomegaly with congestive heart failure. Bilateral interstitial edema and small pleural effusions are present. 2. A component of the cardiomegaly may be related to previously identified pericardial effusion.   Electronically Signed   By: Dilley   On: 03/13/2014 07:57   Dg Chest 2 View  03/11/2014   CLINICAL DATA:  Chest pain and shortness of breath.  EXAM: CHEST  2 VIEW  COMPARISON:  Chest radiograph 12/26/2013  FINDINGS: Marked cardiomegaly. Bilateral perihilar and diffuse interstitial pulmonary opacities. No  definite pleural effusion or pneumothorax. Mid thoracic spine degenerative change. Prominent small bowel loops, incompletely visualized.  IMPRESSION: Cardiomegaly and mild interstitial pulmonary edema.  Prominent upper abdominal small bowel loops, incompletely visualized. Consider correlation with dedicated abdominal radiography.   Electronically Signed   By: Lovey Newcomer M.D.   On: 03/11/2014 21:15   Nm Pulmonary Perf And Vent  03/12/2014   CLINICAL DATA:  64 year old female with acute shortness of breath and chest pain. Initial encounter.  EXAM: NUCLEAR MEDICINE PERFUSION LUNG SCAN  TECHNIQUE: Perfusion images were obtained in multiple projections after intravenous injection of radiopharmaceutical.  The patient was un able to perform ventilation.  After only two image acquisitions, the patient did not want to continue with the perfusion study.  RADIOPHARMACEUTICALS:  6 mCi Tc-40mMAA  COMPARISON:  03/11/2014 abdominal CT and chest radiograph.  FINDINGS: No perfusion defects are identified.  Cardiomegaly and pericardial effusion as seen on CT attenuates left lower lung activity.  IMPRESSION: No perfusion defects identified -no evidence of pulmonary emboli.   Electronically Signed   By: JHassan RowanM.D.   On: 03/12/2014 19:04   Dg Chest Port 1 View  03/19/2014   CLINICAL DATA:  64year old female with acute on chronic respiratory failure. Chronic right heart failure, chronic renal disease. Pericardial effusion. Shortness of Breath. Initial encounter.  EXAM: PORTABLE CHEST - 1 VIEW  COMPARISON:  03/17/2014 and earlier.  FINDINGS: Portable AP semi upright view at 0505 hrs. Severe cardiomegaly and mediastinal contours are stable. Since 03/11/2014, resolved pulmonary edema. Improved bibasilar ventilation. No pneumothorax, consolidation, or definite pleural effusion. Residual perihilar streaky opacity, mostly on the left, favor atelectasis. Stable right PICC line. Visualized tracheal air column is within normal limits.   IMPRESSION: Resolved pulmonary edema since December. Stable severe cardiomegaly. Residual atelectasis.   Electronically Signed   By: LLars PinksM.D.   On: 03/19/2014 07:25   Dg Chest Port 1 View  03/17/2014   CLINICAL DATA:  Acute respiratory failure.  EXAM: PORTABLE CHEST - 1 VIEW  COMPARISON:  03/13/2014  FINDINGS: Support apparatus: Right-sided PICC line tip low SVC.  New.  Cardiomediastinal silhouette: Moderate enlargement of the cardiopericardial silhouette, similar.  Pleura: No right-sided pleural effusion. The left pleural space is poorly evaluated secondary to the extent of cardiomegaly. Right hemidiaphragm elevation. No pneumothorax.  Lungs: Improvement in mild interstitial edema. Left greater than right bibasilar airspace disease. Grossly similar.  Other: None  IMPRESSION: Improvement in mild interstitial edema.  Cardiomegaly.  Cannot exclude pericardial effusion.  Bibasilar opacities. Favor atelectasis. At the left lung base, infection cannot be excluded.   Electronically Signed   By: KAbigail MiyamotoM.D.   On: 03/17/2014 07:53   Dg Knee Complete  4 Views Left  02/27/2014   CLINICAL DATA:  Trauma yesterday with pain and swelling. History of left knee arthroplasty.  EXAM: LEFT KNEE - COMPLETE 4+ VIEW  COMPARISON:  12/26/2010  FINDINGS: Long-stem total knee arthroplasty. No acute fracture or dislocation. Cannot exclude small suprapatellar joint effusion. No acute hardware complication.  IMPRESSION: No acute osseous abnormality. Total knee arthroplasty. Cannot exclude small suprapatellar joint effusion.   Electronically Signed   By: Abigail Miyamoto M.D.   On: 02/27/2014 15:18   Dg Abd Portable 1v  03/11/2014   CLINICAL DATA:  One day history of abdominal pain  EXAM: PORTABLE ABDOMEN - 1 VIEW  COMPARISON:  June 07, 2013  FINDINGS: There is moderate stool in the colon. Bowel gas pattern is unremarkable. No obstruction or free air is seen on this supine examination. There are small phleboliths in the  pelvis. Liver appears prominent but stable.  IMPRESSION: Bowel gas pattern unremarkable.  Liver prominent but stable.   Electronically Signed   By: Lowella Grip M.D.   On: 03/11/2014 21:45    PHYSICAL EXAM CVP 8 General: NAD Neck: JVP 7 cm, no thyromegaly or thyroid nodule.  Lungs: Crackles at bases bilaterally.  CV: RV heave.  Heart regular S1/S2, no S3/S4, 2/6 HSM LLSB.  No peripheral edema.   Abdomen: Soft, nontender, no hepatosplenomegaly, no distention.  Neurologic: Alert and oriented x 3.  Psych: Normal affect. Extremities: No clubbing or cyanosis.   TELEMETRY: Reviewed telemetry pt in NSR  ASSESSMENT AND PLAN: 63 yo with history of PAH and scleroderma was admitted with right-sided heart failure and large pericardial effusion.  Treatment has been limited in past by multiple med intolerances and compliance issues.  1. Right-sided heart failure: RV moderately dilated/dysfunctional on echo with volume overload.  She  diuresed initially with milrinone for RV support/lowering of PA pressures, followed by rise in creatinine.  Creatinine 1.9 today in setting of GI bleeding.  Milrinone stopped 1/7.  - Hold Lasix for now with falling BP and GI bleeding.   2. PAH:  Severe PAH, PASP 111 mmHg by echo this admission, higher than in the past.  She had been on Letairis and Adcirca at home.  Adcirca held with low BP and sildenafil begun.  She is tolerating this so far.  RHC as above, PA pressure lower than on echo.  Stopped milrinone 1/7.   - Will not be able to start on Tyvaso as inpatient, pulmonary office working on paperwork to get Tyvaso for her.     - Continue Letairis and sildenafil for now, increased Letairis to 10 mg daily 1/3 now that she appears diuresed.  Agree that she would be a poor IV epoprostenol candidate with med intolerances and compliance issues.  She did not like taking Tyvaso in the past but seemed to have had the best response to this agent.  Orenitram (po treprostinil) would  be a consideration but trials in combination with PDE-5 inhibitors and endothelin antagonists did not show additive benefit from orenitram. As above, pulmonary office working on Tyvaso.  Hold Revatio for now with GI bleeding and low BP.  3. Pericardial effusion: Large pericardial effusion without definite tamponade on echo, but difficult to tell hemodynamic effect given severe pulmonary hypertension with RV failure.  Often see pericardial effusion with severe pulmonary hypertension/RV failure.  I did RHC/LHC with simultaneous left and right ventricular pressure tracings.  This showed respirophasic ventricular interdependence that is suggestive of possible tamponade physiology.  Difficult situation, she would  be at relatively high risk for complications from pericardiocentesis with severe PAH.  Continue gentle diuresis as able (held today with GI bleeding).  4. GI bleeding: Hemoglobin down to 6 today with clots in stool.  Getting 2 units PRBCs.  Stopping ASA. Consulting GI, keep NPO.  BID protonix.    [name] 03/22/2014 10:13 AM

## 2014-03-22 NOTE — Progress Notes (Signed)
CRITICAL VALUE ALERT  Critical value received:  Hemoglobin 5.9  Date of notification:  03/22/2014  Time of notification:  0425  Critical value read back:yes Nurse who received alert:  Purcell Nails  MD notified (1st page):  Domingo Mend  Time of first page:  0425  MD notified (2nd page):  Time of second page:  Responding MD:  Domingo Mend  Time MD responded:  [phone]

## 2014-03-23 ENCOUNTER — Encounter (HOSPITAL_COMMUNITY)
Admission: EM | Disposition: A | Discharge status: Home / Self Care | Payer: Self-pay | Source: Home / Self Care | Attending: Emergency Medicine

## 2014-03-23 ENCOUNTER — Encounter (HOSPITAL_COMMUNITY): Payer: Self-pay | Admitting: Gastroenterology

## 2014-03-23 DIAGNOSIS — K31811 Angiodysplasia of stomach and duodenum with bleeding: Secondary | ICD-10-CM

## 2014-03-23 DIAGNOSIS — K921 Melena: Secondary | ICD-10-CM | POA: Insufficient documentation

## 2014-03-23 HISTORY — PX: ESOPHAGOGASTRODUODENOSCOPY: SHX5428

## 2014-03-23 LAB — BASIC METABOLIC PANEL
Anion gap: 10 (ref 5–15)
BUN: 75 mg/dL — ABNORMAL HIGH (ref 6–23)
CHLORIDE: 103 meq/L (ref 96–112)
CO2: 23 mmol/L (ref 19–32)
Calcium: 8.9 mg/dL (ref 8.4–10.5)
Creatinine, Ser: 1.83 mg/dL — ABNORMAL HIGH (ref 0.50–1.10)
GFR, EST AFRICAN AMERICAN: 33 mL/min — AB (ref >90)
GFR, EST NON AFRICAN AMERICAN: 28 mL/min — AB (ref >90)
GLUCOSE: 110 mg/dL — AB (ref 70–99)
Potassium: 4 mmol/L (ref 3.5–5.1)
SODIUM: 136 mmol/L (ref 135–145)

## 2014-03-23 LAB — CBC
HCT: 21.5 % — ABNORMAL LOW (ref 36.0–46.0)
Hemoglobin: 7 g/dL — ABNORMAL LOW (ref 12.0–15.0)
MCH: 26.3 pg (ref 26.0–34.0)
MCHC: 32.6 g/dL (ref 30.0–36.0)
MCV: 80.8 fL (ref 78.0–100.0)
Platelets: 213 10*3/uL (ref 150–400)
RBC: 2.66 MIL/uL — ABNORMAL LOW (ref 3.87–5.11)
RDW: 15.6 % — ABNORMAL HIGH (ref 11.5–15.5)
WBC: 9.3 10*3/uL (ref 4.0–10.5)

## 2014-03-23 SURGERY — EGD (ESOPHAGOGASTRODUODENOSCOPY)
Anesthesia: Moderate Sedation

## 2014-03-23 MED ORDER — FENTANYL CITRATE 0.05 MG/ML IJ SOLN
INTRAMUSCULAR | Status: DC | PRN
  Administered 2014-03-23 (×2): 25 ug via INTRAVENOUS

## 2014-03-23 MED ORDER — BUTAMBEN-TETRACAINE-BENZOCAINE 2-2-14 % EX AERO
INHALATION_SPRAY | CUTANEOUS | Status: DC | PRN
  Administered 2014-03-23: 2 via TOPICAL

## 2014-03-23 MED ORDER — DIPHENHYDRAMINE HCL 50 MG/ML IJ SOLN
INTRAMUSCULAR | Status: AC
  Filled 2014-03-23: qty 1

## 2014-03-23 MED ORDER — MIDAZOLAM HCL 10 MG/2ML IJ SOLN
INTRAMUSCULAR | Status: DC | PRN
Start: 2014-03-23 — End: 2014-03-23
  Administered 2014-03-23 (×2): 2 mg via INTRAVENOUS

## 2014-03-23 MED ORDER — MIDAZOLAM HCL 5 MG/ML IJ SOLN
INTRAMUSCULAR | Status: AC
  Filled 2014-03-23: qty 2

## 2014-03-23 MED ORDER — FENTANYL CITRATE 0.05 MG/ML IJ SOLN
INTRAMUSCULAR | Status: AC
  Filled 2014-03-23: qty 2

## 2014-03-23 NOTE — Op Note (Signed)
[facility] [place], [id number]   ENDOSCOPY PROCEDURE REPORT  PATIENT: [name], [name]  MR#: [id number] BIRTHDATE: 04/14/1950 , 63  yrs. old GENDER: female ENDOSCOPIST: Ladene Artist, MD, [facility] REFERRED BY:  [facility] PROCEDURE DATE:  03/23/2014 PROCEDURE:  EGD w/ control of bleeding ASA CLASS:     Class III INDICATIONS:  melena. MEDICATIONS: Fentanyl 50 mcg IV and Versed 4 mg IV TOPICAL ANESTHETIC: Cetacaine Spray DESCRIPTION OF PROCEDURE: After the risks benefits and alternatives of the procedure were thoroughly explained, informed consent was obtained.  The Pentax Gastroscope [id number] endoscope was introduced through the mouth and advanced to the third portion of the duodenum , Without limitations.  The instrument was slowly withdrawn as the mucosa was fully examined.  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  Argon plasma coagulation was applied to the site with complete hemostasis achieved.  The duodenal mucosa showed no abnormalities in the 3rd part of the duodenum and duodenal bulb. STOMACH: The mucosa of the stomach appeared normal. ESOPHAGUS: The mucosa of the esophagus appeared normal.  Retroflexed views revealed no abnormalities.     The scope was then withdrawn from the patient and the procedure completed.  COMPLICATIONS: There were no immediate complications.  ENDOSCOPIC IMPRESSION: 1.   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   The EGD otherwise appeared normal  RECOMMENDATIONS: 1.  Continue PPI bid for now 2.  Avoid ASA/NSAIDS for at least two weeks 3.  Avoid anticoagulants, if possible, with known colonic and duodenal AVMs 4.  GI follow up with Dr. RDoretha Souas needed  eSigned:  MLadene Artist MD, FAscension Se [facility]01/11/2014 11:27 AM

## 2014-03-23 NOTE — H&P (View-Only) (Signed)
                                                                           [facility] Consult: 12:08 PM 03/22/2014  LOS: 11 days    Referring Provider: Dr McLean.  Primary Care Physician:  MULLEN, EMILY, MD Primary Gastroenterologist:  Dr. Kaplan   Reason for Consultation:  Melena/burgundy stool   HPI: [name] is a 63 y.o. female.  Medical problems include scleroderma/systemic sclerosis, severe pulmonary hypertension. 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 Type II diabetic with chronic kidney disease.  Dr. Byrum follows her for her pulmonany hypertension.  She takes oral iron supplementation.  Her only "blood thinner" is 81 mg aspirin.  Patient was admitted nearly 2 weeks ago with dyspnea and chest pain. A pericardial effusion was noted. However the presentation is suspected due to progressive pulmonary artery hypertension and secondary right ventricular failure. She has been diuresed and started on milrinone drip. Sildenafil was started to replace Adcirca  Last night she had a burgundy, dark stool.  6 AM she had another which nurse describes as clotted, not bright red.  [name] hemoglobin has dropped to 5.9 today down from 9.7 and 9.4 within the last 3 days. 2 units packed red blood cells ordered.  Her baseline hemoglobin looks to be about 9-10.  MCV is normal, platelets and white blood cell count are normal.  As of 5 days ago the PT/INR were normal at 15.2/1.1.  Her BUN has risen from the mid 50s up to 86 and her creatinine has risen from 1.5 up to 1.9.  Blood thinning medications reviewed in the MAR.  she was on Lovenox but this was last dosed yesterday and has now been discontinued. Last subcutaneous heparin was 4 days ago.  [name] last visit with Dr. Kaplan was in mid September 2015 in order to follow-up her microscopic colitis with  diarrhea/fecal spotting.  She had been started on Lialda following her colonoscopy in July. It was unclear whether or not she was taking this medication.  Dr. Kaplan advised her to continue fiber supplementation. He did not start her back on the Lialda. Pt says she sees blodd per rectum about once per month, sees blood with her brown BMs.  Not deep red stool as is having now.  No current abdominal pain or nausea.  Not a great historian.     ENDOSCOPIC STUDIES: 09/18/13  Colonoscopy For fecal incontinence Faint erythema throughout the colon. AVMs seen throughout the colon but more prominent on the left. Internal hemorrhoids. Pathology: Showed microscopic colitis  11/2011 colonoscopy   02/2010 EGD to third portion of duodenum For dysphagia and history scleroderma. Megaesophagus noted throughout the entire esophagus. No stricture noted but. Maloney dilatation performed.  08/2008 EGD by Dr. George Orr. For complaint of abdominal pain. Decreased esophageal peristalsis noted but grossly normal mucosa of the esophagus, stomach and small bowel.  08/2008 colonoscopy For abdominal pain. Internal hemorrhoids noted. Otherwise normal study.  1994 EGD. By Dr. Kaplan Noted gastritis/esophagitis.  Past Medical History  Diagnosis Date  . Secondary pulmonary hypertension     right heart cath 04/20/04  . Cough   . Allergic rhinitis, cause unspecified   .   Esophageal reflux   . Systemic sclerosis   . Unspecified essential hypertension   . Gastritis   . Sickle cell trait     "trace"  . Obesity   . Visual changes   . Scleroderma   . Diastolic dysfunction   . Trichomonas   . Vaginal bleeding   . Neuropathy   . CHF (congestive heart failure)   . Type II diabetes mellitus     DIET CONTROL   . Bursitis   . History of blood transfusion     "think it was related to when I had partial hysteretomy; might have been for one of my knee ORs"  . Degeneration of lumbar or lumbosacral intervertebral disc     . Arthritis     "all over my body" (08/04/2013)  . Chronic kidney disease     "been in hospital for it; don't know what kind" (08/04/2013)    Past Surgical History  Procedure Laterality Date  . Tubal ligation    . Esophagogastroduodenoscopy  02/23/2010    multiple  . Shoulder arthroscopy Right 12/2009    subacromial decompression  . Replacement total knee Left 11/2000  . Cardiac catheterization Right 04/24/2004  . Tonsillectomy  1960's  . Abdominal hysterectomy  1983    "partial"  . Joint replacement    . Shoulder arthroscopy w/ rotator cuff repair Right 01/2010  . Excisional total knee arthroplasty with antibiotic spacers Left 08/2010    "got infected & had to take 1st replacement out"  . Revision total knee arthroplasty Left 11/2010    "removed spacers; replaced knee"  . Total knee revision with scar debridement/patella revision with poly exchange Left 12/2010    fell; knee split opened; had to redo revision"  . Cardiac catheterization Left 07/2010  . Peripherally inserted central catheter insertion  09/2010  . Colonoscopy  09/06/2008    normal----Dr. Orr   . Right heart catheterization N/A 03/18/2014    Procedure: RIGHT HEART CATH;  Surgeon: Dalton S McLean, MD;  Location: [facility] CATH LAB;  Service: Cardiovascular;  Laterality: N/A;    Prior to Admission medications   Medication Sig Start Date End Date Taking? Authorizing Provider  ambrisentan (LETAIRIS) 5 MG tablet Take 5 mg by mouth daily.   Yes Historical Provider, MD  aspirin EC 81 MG EC tablet Take 1 tablet (81 mg total) by mouth daily. 06/14/13  Yes Ali B Cosgrove, NP  esomeprazole (NEXIUM) 40 MG capsule Take by mouth 2 (two) times daily.     Yes Historical Provider, MD  fenofibrate (TRICOR) 48 MG tablet Take 48 mg by mouth daily.   Yes Historical Provider, MD  ferrous sulfate ([employer] IRON) 325 (65 FE) MG tablet Take 325 mg by mouth daily with breakfast.   Yes Historical Provider, MD  FLUoxetine (PROZAC) 20 MG capsule take 1 capsule  by mouth every morning 01/14/14  Yes Emily B Mullen, MD  furosemide (LASIX) 40 MG tablet Take 1 tablet (40 mg total) by mouth 2 (two) times daily. 01/17/14  Yes Ali B Cosgrove, NP  gabapentin (NEURONTIN) 100 MG capsule Take 1 capsule (100 mg total) by mouth 2 (two) times daily. 08/17/13  Yes Nancy Carolyn Martin, NP  HYDROcodone-acetaminophen (NORCO) 10-325 MG per tablet Take 1 tablet by mouth every 6 (six) hours as needed for moderate pain.  07/17/12  Yes Historical Provider, MD  pregabalin (LYRICA) 150 MG capsule Take 1 capsule (150 mg total) by mouth 3 (three) times daily. 08/17/13  Yes Nancy Carolyn Martin,   NP  promethazine (PHENERGAN) 12.5 MG tablet Take 1 tablet (12.5 mg total) by mouth every 6 (six) hours as needed for nausea or vomiting. 02/27/14  Yes Emily B Mullen, MD  Tadalafil, PAH, (ADCIRCA) 20 MG TABS Take 40 mg by mouth daily.   Yes Historical Provider, MD  ambrisentan (LETAIRIS) 10 MG tablet Take 1 tablet (10 mg total) by mouth daily. 03/19/14   Robert S Byrum, MD    Scheduled Meds: . sodium chloride   Intravenous Once  . ambrisentan  10 mg Oral Daily  . fenofibrate  54 mg Oral Daily  . FLUoxetine  20 mg Oral q morning - 10a  . ondansetron (ZOFRAN) IV  4 mg Intravenous Once  . pantoprazole  40 mg Oral BID  . pregabalin  150 mg Oral TID  . sildenafil  20 mg Oral TID  . sodium chloride  10-40 mL Intracatheter Q12H   Infusions: . sodium chloride 5 mL/hr at 03/16/14 2000   PRN Meds: acetaminophen, HYDROcodone-acetaminophen, ondansetron (ZOFRAN) IV, sodium chloride   Allergies as of 03/11/2014 - Review Complete 03/11/2014  Allergen Reaction Noted  . Cephalexin Rash 05/01/2010  . Ciprofloxacin Rash 07/23/2010  . Codeine Other (See Comments)   . Contrast media [iodinated diagnostic agents] Hives 08/03/2011  . Iohexol Hives 03/04/2008    Family History  Problem Relation Age of Onset  . Heart disease Mother   . Diabetes Mother   . Diabetes Sister     History   Social  History  . Marital Status: Legally Separated    Spouse Name: N/A    Number of Children: 2  . Years of Education: 11   Occupational History  . Umemployed   . Disability     Social History Main Topics  . Smoking status: Never Smoker   . Smokeless tobacco: Never Used  . Alcohol Use: No  . Drug Use: No  . Sexual Activity: Not on file   Other Topics Concern  . Not on file   Social History Narrative    FAMILY HISTORY:  Significant for coronary artery disease and diabetes   Patient lives at home with granddaughter.    Patient has 2 children.    Patient has 11 years of education.    Patient is on disability.    Patient is right handed.    Patient is separated.      REVIEW OF SYSTEMS: Constitutional: not a lot of energy or activity ENT:  No nose bleeds Pulm:  SOB is better, no cough CV:  No palpitations, no LE edema.  GU:  No hematuria, no frequency GI:  Able to swallow solids, though it feels tight going down esophagus.  Has not had any food get stuck Heme:  Per HPI   Transfusions:  Per HPI Neuro:  No headaches, no peripheral tingling or numbness Derm:  No itching, no rash or sores.  Endocrine:  No sweats or chills.  No polyuria or dysuria Immunization:  Up to date on flu, pneumovax.  Travel:  None beyond local counties in last few months.    PHYSICAL EXAM: Vital signs in last 24 hours: Filed Vitals:   03/22/14 1148  BP: 85/47  Pulse: 84  Temp: 98 F (36.7 C)  Resp: 17   Wt Readings from Last 3 Encounters:  03/22/14 142 lb 13.7 oz (64.8 kg)  02/27/14 164 lb 6.4 oz (74.571 kg)  01/25/14 169 lb 4.8 oz (76.794 kg)    General: ill looking, frail.  comfortable Head:    No asymmetry or edema  Eyes:  No icterus, no conj pallor Ears:  Not HOH  Nose:  No discharge or congestion Mouth:  Clear, moist, no blood or lesions Neck:  No mass Lungs:  Clear bil but very reduced overall.   Not dyspneic, not coughing. Heart: RRR.  No MRG Abdomen:  Soft, NT, ND.  Old and healed  lower midline scar.   Rectal: burgundy smooth, soft stool is grossly bloody.  Internal hemorrhoid with external tab, not tender or obviously bleeding   Musc/Skeltl: no joint swelling or contracture Extremities:  No LE edema  Neurologic:  Answers appropriately though groggy.  Oriented x 3.  Moves all 4s Skin:  No rash or sores Tattoos:  none Nodes:  No cervical adenopathy.    Psych:  Flat affect.  Not agitated  Intake/Output from previous day: 01/07 0701 - 01/08 0700 In: -  Out: 1800 [Urine:1800] Intake/Output this shift: Total I/O In: 333 [Blood:333] Out: 450 [Urine:450]  LAB RESULTS:  Recent Labs  03/21/14 0420 03/21/14 1400 03/22/14 0412  WBC 7.0 7.0 9.9  HGB 7.2* 8.1* 5.9*  HCT 21.3* 23.5* 16.9*  PLT 195 216 193   BMET Lab Results  Component Value Date   NA 132* 03/22/2014   NA 134* 03/21/2014   NA 133* 03/20/2014   K 4.0 03/22/2014   K 4.3 03/21/2014   K 4.2 03/20/2014   CL 99 03/22/2014   CL 100 03/21/2014   CL 99 03/20/2014   CO2 21 03/22/2014   CO2 27 03/21/2014   CO2 26 03/20/2014   GLUCOSE 181* 03/22/2014   GLUCOSE 106* 03/21/2014   GLUCOSE 124* 03/20/2014   BUN 86* 03/22/2014   BUN 84* 03/21/2014   BUN 70* 03/20/2014   CREATININE 1.93* 03/22/2014   CREATININE 1.79* 03/21/2014   CREATININE 1.58* 03/20/2014   CALCIUM 8.6 03/22/2014   CALCIUM 9.4 03/21/2014   CALCIUM 9.5 03/20/2014   LFT No results for input(s): PROT, ALBUMIN, AST, ALT, ALKPHOS, BILITOT, BILIDIR, IBILI in the last 72 hours. PT/INR Lab Results  Component Value Date   INR 1.19 03/17/2014   INR 1.46 12/26/2013   INR 1.68* 06/08/2013   PROTIME 18.7 08/20/2008   Lipase     Component Value Date/Time   LIPASE 15 06/07/2013 1845    Drugs of Abuse     Component Value Date/Time   LABOPIA POSITIVE* 10/02/2010 1128   COCAINSCRNUR NONE DETECTED 10/02/2010 1128   LABBENZ NONE DETECTED 10/02/2010 1128   AMPHETMU NONE DETECTED 10/02/2010 1128   THCU NONE DETECTED 10/02/2010  1128   LABBARB NONE DETECTED 10/02/2010 1128       IMPRESSION:   *  GI bleed.  Hard to say if this is upper or lower source. Has multiple colon AVMs on colonoscopy in 09/2013. Microscopic colitis, int rrhoids on 7/15 colonoscopy. 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    *  ABL anemia.  Transfusing now, will get 2 units total. .   *  Severe pulm htn with right heart failure  *  Scleroderma.     PLAN:     *  Agree with holding Lovenox.  Decision re endoscopic eval per Dr Marlee Trentman.  *  I went ahead and stopped oral Iron to eliminate any confusion on coloration of stools.   *  Continue BID Protonix. On Nexium at home.    Sarah Gribbin  03/22/2014, 12:08 PM Pager: [phone]      Attending physician's note   I have taken a history, examined   the patient and reviewed the chart. I agree with the Advanced Practitioner's note, impression and recommendations. Very complicated patient with multiple comorbidities with an acute GI bleed and post hemorrhagic anemia. Suspect LGI bleed from known colonic AVMs. R/O UGI bleed with EGD tomorrow. Avoid Lovenox and other anticoagulants for now. PPI BID for now. The risks, benefits, and alternatives to endoscopy with possible biopsy and possible dilation were discussed with the patient and they consent to proceed.    Rodney Yera T Renessa Wellnitz, MD FACG   

## 2014-03-23 NOTE — Interval H&P Note (Signed)
History and Physical Interval Note:  03/23/2014 10:28 AM  [name]  has presented today for surgery, with the diagnosis of GI bleed  The various methods of treatment have been discussed with the patient and family. After consideration of risks, benefits and other options for treatment, the patient has consented to  Procedure(s): ESOPHAGOGASTRODUODENOSCOPY (EGD) (N/A) as a surgical intervention .  The patient's history has been reviewed, patient examined, no change in status, stable for surgery.  I have reviewed the patient's chart and labs.  Questions were answered to the patient's satisfaction.     Pricilla Riffle. Fuller Plan MD

## 2014-03-23 NOTE — Progress Notes (Signed)
    64 year old female w/ severe PAH in setting of sclerodema. Re-admitted on 12/29 w/ acute resp distress and chest pain c/w decompensated cor pulmonale.   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 APC applied w/ successful hemostasis.    Subjective  Feels much better.   Filed Vitals:   03/23/14 1105 03/23/14 1110 03/23/14 1115 03/23/14 1123  BP: 85/44 1_0  Pulse:    70  Temp:    98.4 F (36.9 C)  TempSrc:    Oral  Resp:   15 15  Height:      Weight:      SpO2: 100% 100% 99% 99%  room air  NAD HEENT WNL Chest posterior rales  Distant HS, no M abd soft, +BS Ext warm without edema   Recent Labs Lab 03/21/14 0420 03/22/14 0335 03/23/14 0500  NA 134* 132* 136  K 4.3 4.0 4.0  CL 100 99 103  CO2 _1 BUN 84* 86* 75*  CREATININE 1.79* 1.93* 1.83*  GLUCOSE 106* 181* 110*    Recent Labs Lab 03/22/14 0412 03/22/14 1800 03/23/14 0500  HGB 5.9* 7.7* 7.0*  HCT 16.9* 22.8* 21.5*  WBC 9.9 11.0* 9.3  PLT 193 219 213     Echo results 12/29 noted RHC results from 01/04 noted Repeat Echo results from 01/04 noted  CXR: NNF  IMPRESSION: PSS Severe PAH - improved on  RHC 01/04 than by Echo 12/29  Off Milrinone 01/07 Hypotension, resolved Pericardial effusion without evidence of tamponade   no pericardiocentesis indicated per Cards Pleural effusion, resolved Acute blood loss anemia: EGD w/ AVM second part od duodenum on 1/9  S/p 2 units PRBCs  Apparent GIB 01/08  GI consult 01/08 CRI -scr stable   PLAN: Cont current Rx Cont to monitor in SDU PPI BID No NSAIDs/ASA x 2 weeks Avoid anticoagulation  F/U CBC in am and xfuse for hgb < 7  Approved for Tyvaso after discharge (discussed with Dr Lamonte Sakai) [facility] for d/c Monday if stable over [place].   Erick Colace ACNP-BC [place] Pager # [phone] OR # [phone] if no answer  Attending Note:  I have examined patient, reviewed labs, studies and notes. I  have discussed the case with Jerrye Bushy, and I agree with the data and plans as amended above.   Baltazar Apo, MD, PhD 03/23/2014, 5:31 PM [facility] [phone] or if no answer [phone]

## 2014-03-24 LAB — CBC
HCT: 20.3 % — ABNORMAL LOW (ref 36.0–46.0)
HEMOGLOBIN: 6.7 g/dL — AB (ref 12.0–15.0)
MCH: 27.5 pg (ref 26.0–34.0)
MCHC: 33 g/dL (ref 30.0–36.0)
MCV: 83.2 fL (ref 78.0–100.0)
PLATELETS: 199 10*3/uL (ref 150–400)
RBC: 2.44 MIL/uL — AB (ref 3.87–5.11)
RDW: 16.4 % — AB (ref 11.5–15.5)
WBC: 10.9 10*3/uL — ABNORMAL HIGH (ref 4.0–10.5)

## 2014-03-24 LAB — COMPREHENSIVE METABOLIC PANEL
ALBUMIN: 2.9 g/dL — AB (ref 3.5–5.2)
ALT: 11 U/L (ref 0–35)
AST: 27 U/L (ref 0–37)
Alkaline Phosphatase: 91 U/L (ref 39–117)
Anion gap: 6 (ref 5–15)
BILIRUBIN TOTAL: 0.9 mg/dL (ref 0.3–1.2)
BUN: 58 mg/dL — ABNORMAL HIGH (ref 6–23)
CALCIUM: 9.2 mg/dL (ref 8.4–10.5)
CO2: 22 mmol/L (ref 19–32)
CREATININE: 1.63 mg/dL — AB (ref 0.50–1.10)
Chloride: 107 mEq/L (ref 96–112)
GFR calc non Af Amer: 33 mL/min — ABNORMAL LOW (ref >90)
GFR, EST AFRICAN AMERICAN: 38 mL/min — AB (ref >90)
Glucose, Bld: 97 mg/dL (ref 70–99)
Potassium: 4.1 mmol/L (ref 3.5–5.1)
SODIUM: 135 mmol/L (ref 135–145)
Total Protein: 6.1 g/dL (ref 6.0–8.3)

## 2014-03-24 LAB — PREPARE RBC (CROSSMATCH)

## 2014-03-24 MED ORDER — SODIUM CHLORIDE 0.9 % IV SOLN: Freq: Once | INTRAVENOUS | Status: DC

## 2014-03-24 MED ORDER — POTASSIUM CHLORIDE CRYS ER 20 MEQ PO TBCR
20.0000 meq | EXTENDED_RELEASE_TABLET | Freq: Once | ORAL | Status: AC
  Administered 2014-03-24: 20 meq via ORAL
  Filled 2014-03-24: qty 1

## 2014-03-24 MED ORDER — FUROSEMIDE 10 MG/ML IJ SOLN
40.0000 mg | Freq: Once | INTRAMUSCULAR | Status: AC
Start: 2014-03-24 — End: 2014-03-24
  Administered 2014-03-24: 40 mg via INTRAVENOUS
  Filled 2014-03-24: qty 4

## 2014-03-24 MED ORDER — PANTOPRAZOLE SODIUM 40 MG PO TBEC
40.0000 mg | DELAYED_RELEASE_TABLET | Freq: Every day | ORAL | Status: DC
  Administered 2014-03-24 – 2014-03-25 (×2): 40 mg via ORAL
  Filled 2014-03-24 (×2): qty 1

## 2014-03-24 NOTE — Progress Notes (Signed)
Md aware of Hgb 6.7.  New orders received. No signs of bleeding noted. Will continue to monitor. Saunders Revel T

## 2014-03-24 NOTE — Progress Notes (Signed)
eLink Physician-Brief Progress Note Patient Name: [name] DOB: 05-14-1950 MRN: [id number]   Date of Service  03/24/2014  HPI/Events of Note  Hgb 6.7 down from 7.0  eICU Interventions  Plan: Transfuse 1 u pRBC Post-transfusion CBC     Intervention Category Intermediate Interventions: Bleeding - evaluation and treatment with blood products  DETERDING,ELIZABETH 03/24/2014, 4:13 AM

## 2014-03-24 NOTE — Progress Notes (Addendum)
Daily Rounding Note  03/24/2014, 10:36 AM  LOS: 13 days   SUBJECTIVE:       Last BM was 2 days ago.  She is hungry, tired of bed rest.  Not dizzy.  Feels a bit weak.  No chest pain or dyspnea.   OBJECTIVE:         Vital signs in last 24 hours:    Temp:  [97.5 F (36.4 C)-98.6 F (37 C)] 98.4 F (36.9 C) (01/10 0900) Pulse Rate:  [65-72] 71 (01/10 0900) Resp:  [13-18] 13 (01/10 0900) BP: (81-106)/(42-78) 90/46 mmHg (01/10 0900) SpO2:  [96 %-100 %] 100 % (01/10 0900) Weight:  [145 lb 11.6 oz (66.1 kg)] 145 lb 11.6 oz (66.1 kg) (01/10 0330) Last BM Date: 03/23/14 Filed Weights   03/22/14 0300 03/23/14 0400 03/24/14 0330  Weight: 142 lb 13.7 oz (64.8 kg) 144 lb 2.9 oz (65.4 kg) 145 lb 11.6 oz (66.1 kg)   General: thin, chronically ill looking.  comfortable   Heart: RRR.  No MRG. No dizziness with standing up.  Chest:   Clear bil.  Diminished BS Abdomen: soft, thini, NT.  Active BS  Extremities: no CCE Neuro/Psych:  Pleasant, cooperative.  Oriented x 3 and alert.   Intake/Output from previous day: 01/09 0701 - 01/10 0700 In: 2350 [P.O.:1080; I.V.:1040; Blood:230] Out: 1900 [Urine:1900]  Intake/Output this shift: Total I/O In: 756.7 [P.O.:360; I.V.:30; Blood:366.7] Out: 250 [Urine:250]  Lab Results:  Recent Labs  03/22/14 1800 03/23/14 0500 03/24/14 0327  WBC 11.0* 9.3 10.9*  HGB 7.7* 7.0* 6.7*  HCT 22.8* 21.5* 20.3*  PLT 219 213 199   BMET  Recent Labs  03/22/14 0335 03/23/14 0500 03/24/14 0327  NA 132* 136 135  K 4.0 4.0 4.1  CL 99 103 107  CO2 _0 GLUCOSE 181* 110* 97  BUN 86* 75* 58*  CREATININE 1.93* 1.83* 1.63*  CALCIUM 8.6 8.9 9.2   LFT  Recent Labs  03/24/14 0327  PROT 6.1  ALBUMIN 2.9*  AST 27  ALT 11  ALKPHOS 91  BILITOT 0.9     ASSESMENT:   * GI bleed. Colonic AVMs on colonoscopy in 09/2013. Microscopic colitis, int rrhoids on 7/15 colonoscopy. Megaesophagus  on 2011 EGD.  03/23/13 EGD: APC ablation of AVMs in D2, otherwise normal study.   * ABL anemia. 3rd unit PRBCs given this AM.     * Severe pulm htn with right heart failure  * Scleroderma.     PLAN   *  PPI bid for 2 weeks and then PPI once daily long term.  Carb mod diet.    Azucena Freed  03/24/2014, 10:36 AM Pager: [phone]     Attending physician's note   I have taken an interval history, reviewed the chart and examined the patient. I agree with the Advanced Practitioner's note, impression and recommendations. Bleeding duodenal AVM treated with APC. Multiple colon AVMs on 09/2013 colonoscopy. No recurrent bleeding noted.  1. Continue PPI bid for 2 weeks and the PPI qam long term 2. Avoid ASA/NSAIDS for at least two weeks 3. Avoid anticoagulants, if possible, with known colonic AVMs and duodenal AVM 4.  Advance diet 5. GI signing off. GI follow up with Dr. Doretha Sou as needed  Pricilla Riffle. Fuller Plan, MD [facility]

## 2014-03-24 NOTE — Progress Notes (Addendum)
Patient ID: MALYNN LUCY, female   DOB: May 14, 1950, 64 y.o.   MRN: [id number]   SUBJECTIVE: Ms. Lebon is a 64 yo female with a history of DM2, HLD, HTN, pHTN, GERD, gastroparesis, scleroderma, DJD and diastolic HF.   Of note she has been on several PAH targeted therapies and coumadin over last 9 years, but has not been reliably on therapy due to noncompliance and inability to have labs performed. She has had best response to Tyvaso (+ bosentan) with an improvement in 6 minute walk and in PASP from 102 mmHg (7/09) to 42 mmHg (9/10). After stopping Tyvaso her PASP rose to 80's by TTE 6/13 and 11/14. Surprisingly, she has never required supplemental O2. Macitentan was started in 11/14. She developed nausea and diarrhea and macitentan was stopped early 3/15. Most recently, she had been on Letairis and Adcirca (followed by Dr. Lamonte Sakai).   Admitted 10/14-10/18/15 for A/C RHF, volume overload and syncope. Diuresed with IV lasix and milrinone a total of 26 lbs. Discharge weight was 156 lbs and started on lasix 40 mg BID.   She was admitted again on 12/29 with dyspnea and chest pain.  BP was low, and Lasix and Adcirca were both initially held.  Echo showed EF 60-65% with moderately dilated and dysfunctional RV, PASP 111 mmHg with moderate TR.  There was a large pericardial effusion without definite tamponade.  Presentation was suspected to be due to progressive PAH with secondary RV failure rather than tamponade.  No pericardiocentesis yet. She was started on milrinone gtt and diuresed. Shorter-acting sildenafil was started instead of Adcirca.   RHC./LHC (1/4):  Hemodynamics (mmHg) RA mean 12 RV 64/14 PA 68/29, mean 42 PCWP mean 12 LV 102/14 AO 105/53 There was respirophasic ventricular interdependence noted with simultaneous RV and LV pressure tracings (LV systolic pressure fell as RV systolic pressure rose and vice versa).  Oxygen saturations: PA 67% AO 93% Cardiac Output (Fick) 6.63  Cardiac Index  (Fick) 3.88  PVR 4.5 WU Cardiac Output (Thermo) 5.97 Cardiac Index (Thermo) 3.49 PVR 5 WU  Milrinone stopped 1/7.  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 Hgb 7.0-> 6.7  Scheduled Meds: . sodium chloride   Intravenous Once  . ambrisentan  10 mg Oral Daily  . fenofibrate  54 mg Oral Daily  . FLUoxetine  20 mg Oral q morning - 10a  . ondansetron (ZOFRAN) IV  4 mg Intravenous Once  . pantoprazole  40 mg Oral Daily  . pregabalin  150 mg Oral TID  . sildenafil  20 mg Oral TID  . sodium chloride  10-40 mL Intracatheter Q12H   Continuous Infusions: . sodium chloride 10 mL/hr (03/24/14 0700)   PRN Meds:.acetaminophen, HYDROcodone-acetaminophen, ondansetron (ZOFRAN) IV, sodium chloride   Filed Vitals:   03/24/14 0600 03/24/14 0700 03/24/14 0800 03/24/14 0900  BP: _0 90/46  Pulse: 68 67 65 71  Temp: 98 F (36.7 C) 97.5 F (36.4 C)  98.4 F (36.9 C)  TempSrc: Oral Oral  Oral  Resp: _1 Height:      Weight:      SpO2: 96% 98% 98% 100%    Intake/Output Summary (Last 24 hours) at 03/24/14 1206 Last data filed at 03/24/14 1031  Gross per 24 hour  Intake 3106.67 ml  Output   1400 ml  Net 1706.67 ml    LABS: Basic Metabolic Panel:  Recent Labs  03/23/14 0500 03/24/14 0327  NA 136 135  K 4.0 4.1  CL 103 107  CO2 23 22  GLUCOSE 110* 97  BUN 75* 58*  CREATININE 1.83* 1.63*  CALCIUM 8.9 9.2   Liver Function Tests:  Recent Labs  03/24/14 0327  AST 27  ALT 11  ALKPHOS 91  BILITOT 0.9  PROT 6.1  ALBUMIN 2.9*   No results for input(s): LIPASE, AMYLASE in the last 72 hours. CBC:  Recent Labs  03/23/14 0500 03/24/14 0327  WBC 9.3 10.9*  HGB 7.0* 6.7*  HCT 21.5* 20.3*  MCV 80.8 83.2  PLT 213 199   Cardiac Enzymes: No results for input(s): CKTOTAL, CKMB, CKMBINDEX, TROPONINI in the last 72 hours. BNP: Invalid input(s): POCBNP D-Dimer: No results for input(s): DDIMER in the last 72  hours. Hemoglobin A1C: No results for input(s): HGBA1C in the last 72 hours. Fasting Lipid Panel: No results for input(s): CHOL, HDL, LDLCALC, TRIG, CHOLHDL, LDLDIRECT in the last 72 hours. Thyroid Function Tests: No results for input(s): TSH, T4TOTAL, T3FREE, THYROIDAB in the last 72 hours.  Invalid input(s): FREET3 Anemia Panel: No results for input(s): VITAMINB12, FOLATE, FERRITIN, TIBC, IRON, RETICCTPCT in the last 72 hours.  RADIOLOGY: Ct Abdomen Pelvis Wo Contrast  03/11/2014   CLINICAL DATA:  Initial evaluation for shortness of breath, abdominal pain.  EXAM: CT ABDOMEN AND PELVIS WITHOUT CONTRAST  TECHNIQUE: Multidetector CT imaging of the abdomen and pelvis was performed following the standard protocol without IV contrast.  COMPARISON:  None.  FINDINGS: Bilateral pleural effusions are partially visualized, right greater than left. There is associated bibasilar atelectasis. Moderate to large pericardial effusion is partially visualized.  Scattered cystic lesions within the right hepatic lobe are stable. Liver is otherwise unremarkable. Gallbladder within normal limits. No biliary dilatation. Spleen, adrenal glands, and pancreas demonstrate a normal unenhanced appearance.  Kidneys are equal in size without evidence of nephrolithiasis or hydronephrosis.  Distal esophagus is somewhat patulous. Stomach within normal limits. No evidence for bowel obstruction. No abnormal wall thickening or inflammatory changes seen about the bowels.  Bladder within normal limits. Uterus and ovaries are not visualized.  Small volume free fluid seen adjacent to the liver.  No free air.  Retroperitoneal adenopathy again seen, similar to prior. Again, the most prominent node is seen in the left periaortic region and 1.5 cm in short axis (series 2, image 50, previously 1.6 cm). Left iliac node measures up to 1.3 cm in short axis (series 2, image 73). There are additional mildly prominent but not enlarged iliac nodes  bilaterally. No pathologically enlarged inguinal lymph nodes identified. Overall, the degree of adenopathy is stable to slightly decreased from prior study.  No acute osseous abnormality. No worrisome lytic or blastic osseous lesions.  Mild diffuse anasarca.  IMPRESSION: 1. Moderate pericardial effusion with small bilateral pleural effusions, right greater than left, and small volume ascites. 2. Stable to slightly decreased size of retroperitoneal and iliac adenopathy as above. These nodes are indeterminate. Continued attention on followup is recommended. Additionally, correlation with PET-CT could be considered for further evaluation. 3. No other acute intra-abdominal pelvic process identified.   Electronically Signed   By: Jeannine Boga M.D.   On: 03/11/2014 23:59   Dg Chest 2 View  03/13/2014   CLINICAL DATA:  Shortness of breath.  EXAM: CHEST  2 VIEW  COMPARISON:  CT 02/15/2012.  Chest x-ray 12/26/2013.  FINDINGS: Mediastinum hilar structures are unremarkable. Persistent severe cardiomegaly. Pulmonary venous congestion. Diffuse from interstitial prominence and mild pleural effusions previous  present consistent with congestive heart failure. A component of the severe cardiomegaly may be related to previously identified pericardial effusion. No pneumothorax. No acute osseus abnormality.  IMPRESSION: 1. Severe cardiomegaly with congestive heart failure. Bilateral interstitial edema and small pleural effusions are present. 2. A component of the cardiomegaly may be related to previously identified pericardial effusion.   Electronically Signed   By: [place]   On: 03/13/2014 07:57   Dg Chest 2 View  03/11/2014   CLINICAL DATA:  Chest pain and shortness of breath.  EXAM: CHEST  2 VIEW  COMPARISON:  Chest radiograph 12/26/2013  FINDINGS: Marked cardiomegaly. Bilateral perihilar and diffuse interstitial pulmonary opacities. No definite pleural effusion or pneumothorax. Mid thoracic spine  degenerative change. Prominent small bowel loops, incompletely visualized.  IMPRESSION: Cardiomegaly and mild interstitial pulmonary edema.  Prominent upper abdominal small bowel loops, incompletely visualized. Consider correlation with dedicated abdominal radiography.   Electronically Signed   By: Lovey Newcomer M.D.   On: 03/11/2014 21:15   Nm Pulmonary Perf And Vent  03/12/2014   CLINICAL DATA:  64 year old female with acute shortness of breath and chest pain. Initial encounter.  EXAM: NUCLEAR MEDICINE PERFUSION LUNG SCAN  TECHNIQUE: Perfusion images were obtained in multiple projections after intravenous injection of radiopharmaceutical.  The patient was un able to perform ventilation.  After only two image acquisitions, the patient did not want to continue with the perfusion study.  RADIOPHARMACEUTICALS:  6 mCi Tc-2mMAA  COMPARISON:  03/11/2014 abdominal CT and chest radiograph.  FINDINGS: No perfusion defects are identified.  Cardiomegaly and pericardial effusion as seen on CT attenuates left lower lung activity.  IMPRESSION: No perfusion defects identified -no evidence of pulmonary emboli.   Electronically Signed   By: JHassan RowanM.D.   On: 03/12/2014 19:04   Dg Chest Port 1 View  03/19/2014   CLINICAL DATA:  64year old female with acute on chronic respiratory failure. Chronic right heart failure, chronic renal disease. Pericardial effusion. Shortness of Breath. Initial encounter.  EXAM: PORTABLE CHEST - 1 VIEW  COMPARISON:  03/17/2014 and earlier.  FINDINGS: Portable AP semi upright view at 0505 hrs. Severe cardiomegaly and mediastinal contours are stable. Since 03/11/2014, resolved pulmonary edema. Improved bibasilar ventilation. No pneumothorax, consolidation, or definite pleural effusion. Residual perihilar streaky opacity, mostly on the left, favor atelectasis. Stable right PICC line. Visualized tracheal air column is within normal limits.  IMPRESSION: Resolved pulmonary edema since December. Stable  severe cardiomegaly. Residual atelectasis.   Electronically Signed   By: LLars PinksM.D.   On: 03/19/2014 07:25   Dg Chest Port 1 View  03/17/2014   CLINICAL DATA:  Acute respiratory failure.  EXAM: PORTABLE CHEST - 1 VIEW  COMPARISON:  03/13/2014  FINDINGS: Support apparatus: Right-sided PICC line tip low SVC.  New.  Cardiomediastinal silhouette: Moderate enlargement of the cardiopericardial silhouette, similar.  Pleura: No right-sided pleural effusion. The left pleural space is poorly evaluated secondary to the extent of cardiomegaly. Right hemidiaphragm elevation. No pneumothorax.  Lungs: Improvement in mild interstitial edema. Left greater than right bibasilar airspace disease. Grossly similar.  Other: None  IMPRESSION: Improvement in mild interstitial edema.  Cardiomegaly.  Cannot exclude pericardial effusion.  Bibasilar opacities. Favor atelectasis. At the left lung base, infection cannot be excluded.   Electronically Signed   By: KAbigail MiyamotoM.D.   On: 03/17/2014 07:53   Dg Knee Complete 4 Views Left  02/27/2014   CLINICAL DATA:  Trauma yesterday with pain and swelling. History  of left knee arthroplasty.  EXAM: LEFT KNEE - COMPLETE 4+ VIEW  COMPARISON:  12/26/2010  FINDINGS: Long-stem total knee arthroplasty. No acute fracture or dislocation. Cannot exclude small suprapatellar joint effusion. No acute hardware complication.  IMPRESSION: No acute osseous abnormality. Total knee arthroplasty. Cannot exclude small suprapatellar joint effusion.   Electronically Signed   By: Abigail Miyamoto M.D.   On: 02/27/2014 15:18   Dg Abd Portable 1v  03/11/2014   CLINICAL DATA:  One day history of abdominal pain  EXAM: PORTABLE ABDOMEN - 1 VIEW  COMPARISON:  June 07, 2013  FINDINGS: There is moderate stool in the colon. Bowel gas pattern is unremarkable. No obstruction or free air is seen on this supine examination. There are small phleboliths in the pelvis. Liver appears prominent but stable.  IMPRESSION: Bowel gas  pattern unremarkable.  Liver prominent but stable.   Electronically Signed   By: Lowella Grip M.D.   On: 03/11/2014 21:45    PHYSICAL EXAM CVP 8 General: NAD Neck: JVP 7 cm, no thyromegaly or thyroid nodule.  Lungs: Crackles at bases bilaterally.  CV: RV heave.  Heart regular S1/S2, no S3/S4, 2/6 HSM LLSB.  No peripheral edema.   Abdomen: Soft, nontender, no hepatosplenomegaly, no distention.  Neurologic: Alert and oriented x 3.  Psych: Normal affect. Extremities: No clubbing or cyanosis.   TELEMETRY: Reviewed telemetry pt in NSR  ASSESSMENT AND PLAN: 64 yo with history of PAH and scleroderma was admitted with right-sided heart failure and large pericardial effusion.  Treatment has been limited in past by multiple med intolerances and compliance issues.  1. Right-sided heart failure: RV moderately dilated/dysfunctional on echo with volume overload.  She  diuresed initially with milrinone for RV support/lowering of PA pressures, followed by rise in creatinine. Milrinone stopped 1/7.  --Weight trending back up. Creatinine improved off lasix --Will give 1 dose IV lasix with blood today. --Restart po lasix tomorrow. 2. PAH:  Severe PAH, PASP 111 mmHg by echo this admission, higher than in the past.  She had been on Letairis and Adcirca at home.  Adcirca held with low BP and sildenafil begun.  She is tolerating this so far.  RHC as above, PA pressure lower than on echo.  Stopped milrinone 1/7.   - Will not be able to start on Tyvaso as inpatient, pulmonary office working on paperwork to get Tyvaso for her.     - Continue Letairis. Now on revatio 20 tid. Can transition back to Adcirca at d/c. Agree that she would be a poor IV epoprostenol candidate with med intolerances and compliance issues.  She did not like taking Tyvaso in the past but seemed to have had the best response to this agent.  Orenitram (po treprostinil) would be a consideration but trials in combination with PDE-5 inhibitors  and endothelin antagonists did not show additive benefit from orenitram. As above, pulmonary office working on Tyvaso.   --Can consider newly released selexapeg (oral prostaglandin analogue) 3. Pericardial effusion: Large pericardial effusion without definite tamponade on echo, but difficult to tell hemodynamic effect given severe pulmonary hypertension with RV failure.  Often see pericardial effusion with severe pulmonary hypertension/RV failure.  Dr. Aundra Dubin did RHC/LHC with simultaneous left and right ventricular pressure tracings.  This showed respirophasic ventricular interdependence that is suggestive of possible tamponade physiology.  Difficult situation, she would be at relatively high risk for complications from pericardiocentesis with severe PAH.  Continue gentle diuresis as able (held with GI bleeding). Reassess effusion in  several weeks.  4. 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 Hemoglobin 6.7 today. Off ASA. Got 1u PRBCs this am. Will give a second unit with some lasix. BID protonix.   Start SCDs for DVT prophylaxis.    Glori Bickers MD 03/24/2014 12:06 PM

## 2014-03-24 NOTE — Progress Notes (Signed)
64 year old female w/ severe PAH in setting of sclerodema. Re-admitted on 12/29 w/ acute resp distress and chest pain c/w decompensated cor pulmonale.   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 APC applied w/ successful hemostasis.    Subjective  No new c/o. Getting blood. Comfortable. Wants to go home   Filed Vitals:   03/24/14 0700 03/24/14 0800 03/24/14 0900 03/24/14 1207  BP: 81/43 81/42 90/46 97/55  Pulse: 67 65 71 68  Temp: 97.5 F (36.4 C)  98.4 F (36.9 C) 97.6 F (36.4 C)  TempSrc: Oral  Oral Oral  Resp: _0 Height:      Weight:      SpO2: 98% 98% 100% 100%  room air  NAD HEENT WNL Chest posterior rales very faint  Distant HS, no M abd soft, +BS Ext warm without edema   Recent Labs Lab 03/22/14 0335 03/23/14 0500 03/24/14 0327  NA 132* 136 135  K 4.0 4.0 4.1  CL 99 103 107  CO2 _1 BUN 86* 75* 58*  CREATININE 1.93* 1.83* 1.63*  GLUCOSE 181* 110* 97    Recent Labs Lab 03/22/14 1800 03/23/14 0500 03/24/14 0327  HGB 7.7* 7.0* 6.7*  HCT 22.8* 21.5* 20.3*  WBC 11.0* 9.3 10.9*  PLT 219 213 199     Echo results 12/29 noted RHC results from 01/04 noted Repeat Echo results from 01/04 noted  CXR: NNF  IMPRESSION: PSS Severe PAH - improved on  RHC 01/04 than by Echo 12/29  Off Milrinone 01/07 Hypotension, resolved Pericardial effusion with ?? evidence of tamponade   Will need repeat eval in a few weeks. Difficult to determine the hemodynamic effects of an effusion this large especially in setting PAH Pleural effusion, resolved Acute blood loss anemia: EGD w/ AVM second part od duodenum on 1/9  S/p 2 units PRBCs; 2 more units 1/10 CRI -scr improved  PLAN: Cont current Rx, can likely change sildenifil back to adcirca at time of d/c home Cont to monitor in SDU Lasix X 1 per cards on 1/10 with PRBC PPI BID No NSAIDs/ASA x 2 weeks Avoid anticoagulation  F/U CBC in am and xfuse for hgb < 7   Approved  for Tyvaso after discharge (discussed with Dr Lamonte Sakai) [facility] for d/c Monday if Hgb stabilizes.   Erick Colace ACNP-BC [place] Pager # [phone] OR # [phone] if no answer  Attending Note:  I have examined patient, reviewed labs, studies and notes. I have discussed the case with Jerrye Bushy, and I agree with the data and plans as amended above. Appreciate Dr Bensimhon's assistance. Ezzie appears to be stable and improved after mobilization of fluid. She is tolerating sildenifil and letaris. Will need to go home on adcirca + letaris. Then will plan to start Tyvaso as an outpt. If GIB stabilizes then will d/c to home soon.   Baltazar Apo, MD, PhD 03/24/2014, 4:16 PM [facility] [phone] or if no answer [phone]

## 2014-03-25 ENCOUNTER — Encounter (HOSPITAL_COMMUNITY): Payer: Self-pay | Admitting: Gastroenterology

## 2014-03-25 ENCOUNTER — Inpatient Hospital Stay (HOSPITAL_COMMUNITY): Payer: Medicare Other

## 2014-03-25 LAB — BASIC METABOLIC PANEL
Anion gap: 8 (ref 5–15)
BUN: 45 mg/dL — AB (ref 6–23)
CO2: 22 mmol/L (ref 19–32)
Calcium: 8.9 mg/dL (ref 8.4–10.5)
Chloride: 106 mEq/L (ref 96–112)
Creatinine, Ser: 1.51 mg/dL — ABNORMAL HIGH (ref 0.50–1.10)
GFR calc Af Amer: 42 mL/min — ABNORMAL LOW (ref >90)
GFR calc non Af Amer: 36 mL/min — ABNORMAL LOW (ref >90)
Glucose, Bld: 96 mg/dL (ref 70–99)
POTASSIUM: 3.8 mmol/L (ref 3.5–5.1)
Sodium: 136 mmol/L (ref 135–145)

## 2014-03-25 LAB — TYPE AND SCREEN
ABO/RH(D): O NEG
Antibody Screen: NEGATIVE
DAT, IgG: NEGATIVE
Donor AG Type: NEGATIVE
Donor AG Type: NEGATIVE
Donor AG Type: NEGATIVE
Donor AG Type: NEGATIVE
Unit division: 0
Unit division: 0
Unit division: 0
Unit division: 0

## 2014-03-25 LAB — CBC
HCT: 28.3 % — ABNORMAL LOW (ref 36.0–46.0)
Hemoglobin: 9.3 g/dL — ABNORMAL LOW (ref 12.0–15.0)
MCH: 27.6 pg (ref 26.0–34.0)
MCHC: 32.9 g/dL (ref 30.0–36.0)
MCV: 84 fL (ref 78.0–100.0)
PLATELETS: 193 10*3/uL (ref 150–400)
RBC: 3.37 MIL/uL — AB (ref 3.87–5.11)
RDW: 18.1 % — ABNORMAL HIGH (ref 11.5–15.5)
WBC: 8.7 10*3/uL (ref 4.0–10.5)

## 2014-03-25 MED ORDER — FENOFIBRATE 54 MG PO TABS: 54.0000 mg | ORAL_TABLET | Freq: Every day | ORAL | Status: DC

## 2014-03-25 MED ORDER — FUROSEMIDE 40 MG PO TABS
40.0000 mg | ORAL_TABLET | Freq: Two times a day (BID) | ORAL | Status: DC
  Administered 2014-03-25: 40 mg via ORAL
  Filled 2014-03-25: qty 1

## 2014-03-25 NOTE — Progress Notes (Signed)
Physical Therapy Treatment Patient Details Name: [name] MRN: [id number] DOB: 1951/03/06 Today's Date: 03/29/14    History of Present Illness 64 yo with history of PAH and scleroderma was admitted with right-sided heart failure and large pericardial effusion. PMH - DM2, HLD, HTN, pHTN, GERD, gastroparesis, scleroderma, DJD and diastolic HF. Pt had GI bleed on 03/22/13.    PT Comments    Pt doing well.  Follow Up Recommendations  No PT follow up     Equipment Recommendations  None recommended by PT    Recommendations for Other Services       Precautions / Restrictions Precautions Precautions: None Restrictions Weight Bearing Restrictions: No    Mobility  Bed Mobility Overal bed mobility: Modified Independent         Sit to supine: Modified independent (Device/Increase time)      Transfers Overall transfer level: Modified independent Equipment used: None Transfers: Sit to/from Stand Sit to Stand: Modified independent (Device/Increase time)            Ambulation/Gait Ambulation/Gait assistance: Supervision Ambulation Distance (Feet): 175 Feet Assistive device: None Gait Pattern/deviations: Step-through pattern;Decreased stride length     General Gait Details: Slightly tentative gait.   Stairs            Wheelchair Mobility    Modified Rankin (Stroke Patients Only)       Balance Overall balance assessment: Needs assistance Sitting-balance support: No upper extremity supported Sitting balance-Leahy Scale: Normal     Standing balance support: No upper extremity supported Standing balance-Leahy Scale: Good                      Cognition Arousal/Alertness: Awake/alert Behavior During Therapy: Flat affect Overall Cognitive Status: Within Functional Limits for tasks assessed                      Exercises      General Comments        Pertinent Vitals/Pain Pain Assessment: No/denies pain    Home Living                      Prior Function            PT Goals (current goals can now be found in the care plan section) Progress towards PT goals: Progressing toward goals    Frequency       PT Plan Current plan remains appropriate    Co-evaluation             End of Session   Activity Tolerance: Patient tolerated treatment well Patient left: in bed;with call bell/phone within reach     Time: 0905-0922 PT Time Calculation (min) (ACUTE ONLY): 17 min  Charges:  $Gait Training: 8-22 mins                    G Codes:      Amen Dargis 2014/03/29, 9:38 AM  [facility] PT [phone]

## 2014-03-25 NOTE — Progress Notes (Signed)
Patient ID: [name], female   DOB: 09-Jun-1950, 64 y.o.   MRN: [id number]   SUBJECTIVE: [name] is a 64 yo female with a history of DM2, HLD, HTN, pHTN, GERD, gastroparesis, scleroderma, DJD and diastolic HF.   Of note she has been on several PAH targeted therapies and coumadin over last 9 years, but has not been reliably on therapy due to noncompliance and inability to have labs performed. She has had best response to Tyvaso (+ bosentan) with an improvement in 6 minute walk and in PASP from 102 mmHg (7/09) to 42 mmHg (9/10). After stopping Tyvaso her PASP rose to 80's by TTE 6/13 and 11/14. Surprisingly, she has never required supplemental O2. Macitentan was started in 11/14. She developed nausea and diarrhea and macitentan was stopped early 3/15. Most recently, she had been on Letairis and Adcirca (followed by Dr. Lamonte Sakai).   Admitted 10/14-10/18/15 for A/C RHF, volume overload and syncope. Diuresed with IV lasix and milrinone a total of 26 lbs. Discharge weight was 156 lbs and started on lasix 40 mg BID.   She was admitted again on 12/29 with dyspnea and chest pain.  BP was low, and Lasix and Adcirca were both initially held.  Echo showed EF 60-65% with moderately dilated and dysfunctional RV, PASP 111 mmHg with moderate TR.  There was a large pericardial effusion without definite tamponade.  Presentation was suspected to be due to progressive PAH with secondary RV failure rather than tamponade.  No pericardiocentesis yet. She was started on milrinone gtt and diuresed. Shorter-acting sildenafil was started instead of Adcirca.   RHC./LHC (1/4):  Hemodynamics (mmHg) RA mean 12 RV 64/14 PA 68/29, mean 42 PCWP mean 12 LV 102/14 AO 105/53 There was respirophasic ventricular interdependence noted with simultaneous RV and LV pressure tracings (LV systolic pressure fell as RV systolic pressure rose and vice versa).  Oxygen saturations: PA 67% AO 93% Cardiac Output (Fick) 6.63  Cardiac Index  (Fick) 3.88  PVR 4.5 WU Cardiac Output (Thermo) 5.97 Cardiac Index (Thermo) 3.49 PVR 5 WU  Milrinone stopped 1/7.  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 Hgb 7.0-> 6.7 -> 9.3.  Feels good today, ready to go home.   Scheduled Meds: . sodium chloride   Intravenous Once  . ambrisentan  10 mg Oral Daily  . fenofibrate  54 mg Oral Daily  . FLUoxetine  20 mg Oral q morning - 10a  . furosemide  40 mg Oral BID  . ondansetron (ZOFRAN) IV  4 mg Intravenous Once  . pantoprazole  40 mg Oral Daily  . pregabalin  150 mg Oral TID  . sildenafil  20 mg Oral TID  . sodium chloride  10-40 mL Intracatheter Q12H   Continuous Infusions: . sodium chloride 10 mL/hr at 03/24/14 2000   PRN Meds:.acetaminophen, HYDROcodone-acetaminophen, ondansetron (ZOFRAN) IV, sodium chloride   Filed Vitals:   03/25/14 0200 03/25/14 0300 03/25/14 0400 03/25/14 0600  BP: 94/42  88/45 91/41  Pulse:  76 74   Temp:  97.9 F (36.6 C)    TempSrc:  Oral    Resp: _0 Height:      Weight:  146 lb (66.225 kg)    SpO2:  95%      Intake/Output Summary (Last 24 hours) at 03/25/14 0744 Last data filed at 03/25/14 0500  Gross per 24 hour  Intake 1356.67 ml  Output   2050 ml  Net -693.33  ml    LABS: Basic Metabolic Panel:  Recent Labs  03/24/14 0327 03/25/14 0355  NA 135 136  K 4.1 3.8  CL 107 106  CO2 22 22  GLUCOSE 97 96  BUN 58* 45*  CREATININE 1.63* 1.51*  CALCIUM 9.2 8.9   Liver Function Tests:  Recent Labs  03/24/14 0327  AST 27  ALT 11  ALKPHOS 91  BILITOT 0.9  PROT 6.1  ALBUMIN 2.9*   No results for input(s): LIPASE, AMYLASE in the last 72 hours. CBC:  Recent Labs  03/24/14 0327 03/25/14 0355  WBC 10.9* 8.7  HGB 6.7* 9.3*  HCT 20.3* 28.3*  MCV 83.2 84.0  PLT 199 193   Cardiac Enzymes: No results for input(s): CKTOTAL, CKMB, CKMBINDEX, TROPONINI in the last 72 hours. BNP: Invalid input(s): POCBNP D-Dimer: No results  for input(s): DDIMER in the last 72 hours. Hemoglobin A1C: No results for input(s): HGBA1C in the last 72 hours. Fasting Lipid Panel: No results for input(s): CHOL, HDL, LDLCALC, TRIG, CHOLHDL, LDLDIRECT in the last 72 hours. Thyroid Function Tests: No results for input(s): TSH, T4TOTAL, T3FREE, THYROIDAB in the last 72 hours.  Invalid input(s): FREET3 Anemia Panel: No results for input(s): VITAMINB12, FOLATE, FERRITIN, TIBC, IRON, RETICCTPCT in the last 72 hours.  RADIOLOGY: Ct Abdomen Pelvis Wo Contrast  03/11/2014   CLINICAL DATA:  Initial evaluation for shortness of breath, abdominal pain.  EXAM: CT ABDOMEN AND PELVIS WITHOUT CONTRAST  TECHNIQUE: Multidetector CT imaging of the abdomen and pelvis was performed following the standard protocol without IV contrast.  COMPARISON:  None.  FINDINGS: Bilateral pleural effusions are partially visualized, right greater than left. There is associated bibasilar atelectasis. Moderate to large pericardial effusion is partially visualized.  Scattered cystic lesions within the right hepatic lobe are stable. Liver is otherwise unremarkable. Gallbladder within normal limits. No biliary dilatation. Spleen, adrenal glands, and pancreas demonstrate a normal unenhanced appearance.  Kidneys are equal in size without evidence of nephrolithiasis or hydronephrosis.  Distal esophagus is somewhat patulous. Stomach within normal limits. No evidence for bowel obstruction. No abnormal wall thickening or inflammatory changes seen about the bowels.  Bladder within normal limits. Uterus and ovaries are not visualized.  Small volume free fluid seen adjacent to the liver.  No free air.  Retroperitoneal adenopathy again seen, similar to prior. Again, the most prominent node is seen in the left periaortic region and 1.5 cm in short axis (series 2, image 50, previously 1.6 cm). Left iliac node measures up to 1.3 cm in short axis (series 2, image 73). There are additional mildly  prominent but not enlarged iliac nodes bilaterally. No pathologically enlarged inguinal lymph nodes identified. Overall, the degree of adenopathy is stable to slightly decreased from prior study.  No acute osseous abnormality. No worrisome lytic or blastic osseous lesions.  Mild diffuse anasarca.  IMPRESSION: 1. Moderate pericardial effusion with small bilateral pleural effusions, right greater than left, and small volume ascites. 2. Stable to slightly decreased size of retroperitoneal and iliac adenopathy as above. These nodes are indeterminate. Continued attention on followup is recommended. Additionally, correlation with PET-CT could be considered for further evaluation. 3. No other acute intra-abdominal pelvic process identified.   Electronically Signed   By: Jeannine Boga M.D.   On: 03/11/2014 23:59   Dg Chest 2 View  03/13/2014   CLINICAL DATA:  Shortness of breath.  EXAM: CHEST  2 VIEW  COMPARISON:  CT 02/15/2012.  Chest x-ray 12/26/2013.  FINDINGS: Mediastinum hilar structures  are unremarkable. Persistent severe cardiomegaly. Pulmonary venous congestion. Diffuse from interstitial prominence and mild pleural effusions previous present consistent with congestive heart failure. A component of the severe cardiomegaly may be related to previously identified pericardial effusion. No pneumothorax. No acute osseus abnormality.  IMPRESSION: 1. Severe cardiomegaly with congestive heart failure. Bilateral interstitial edema and small pleural effusions are present. 2. A component of the cardiomegaly may be related to previously identified pericardial effusion.   Electronically Signed   By: [place]   On: 03/13/2014 07:57   Dg Chest 2 View  03/11/2014   CLINICAL DATA:  Chest pain and shortness of breath.  EXAM: CHEST  2 VIEW  COMPARISON:  Chest radiograph 12/26/2013  FINDINGS: Marked cardiomegaly. Bilateral perihilar and diffuse interstitial pulmonary opacities. No definite pleural effusion or  pneumothorax. Mid thoracic spine degenerative change. Prominent small bowel loops, incompletely visualized.  IMPRESSION: Cardiomegaly and mild interstitial pulmonary edema.  Prominent upper abdominal small bowel loops, incompletely visualized. Consider correlation with dedicated abdominal radiography.   Electronically Signed   By: Lovey Newcomer M.D.   On: 03/11/2014 21:15   Nm Pulmonary Perf And Vent  03/12/2014   CLINICAL DATA:  65 year old female with acute shortness of breath and chest pain. Initial encounter.  EXAM: NUCLEAR MEDICINE PERFUSION LUNG SCAN  TECHNIQUE: Perfusion images were obtained in multiple projections after intravenous injection of radiopharmaceutical.  The patient was un able to perform ventilation.  After only two image acquisitions, the patient did not want to continue with the perfusion study.  RADIOPHARMACEUTICALS:  6 mCi Tc-91mMAA  COMPARISON:  03/11/2014 abdominal CT and chest radiograph.  FINDINGS: No perfusion defects are identified.  Cardiomegaly and pericardial effusion as seen on CT attenuates left lower lung activity.  IMPRESSION: No perfusion defects identified -no evidence of pulmonary emboli.   Electronically Signed   By: JHassan RowanM.D.   On: 03/12/2014 19:04   Dg Chest Port 1 View  03/25/2014   CLINICAL DATA:  Pneumonia.  EXAM: PORTABLE CHEST - 1 VIEW  COMPARISON:  03/19/2014.  FINDINGS: Right PICC line stable position . Mediastinum and hilar structures normal. Severe cardiomegaly. Pulmonary vascularity normal. Right base infiltrate. No pleural effusion or pneumothorax. No acute osseus abnormality.  IMPRESSION: 1. Right PICC line in stable position. 2. Right base pulmonary infiltrate. 3. Stable severe cardiomegaly.   Electronically Signed   By: TMarcello Moores Register   On: 03/25/2014 07:29   Dg Chest Port 1 View  03/19/2014   CLINICAL DATA:  64year old female with acute on chronic respiratory failure. Chronic right heart failure, chronic renal disease. Pericardial effusion.  Shortness of Breath. Initial encounter.  EXAM: PORTABLE CHEST - 1 VIEW  COMPARISON:  03/17/2014 and earlier.  FINDINGS: Portable AP semi upright view at 0505 hrs. Severe cardiomegaly and mediastinal contours are stable. Since 03/11/2014, resolved pulmonary edema. Improved bibasilar ventilation. No pneumothorax, consolidation, or definite pleural effusion. Residual perihilar streaky opacity, mostly on the left, favor atelectasis. Stable right PICC line. Visualized tracheal air column is within normal limits.  IMPRESSION: Resolved pulmonary edema since December. Stable severe cardiomegaly. Residual atelectasis.   Electronically Signed   By: LLars PinksM.D.   On: 03/19/2014 07:25   Dg Chest Port 1 View  03/17/2014   CLINICAL DATA:  Acute respiratory failure.  EXAM: PORTABLE CHEST - 1 VIEW  COMPARISON:  03/13/2014  FINDINGS: Support apparatus: Right-sided PICC line tip low SVC.  New.  Cardiomediastinal silhouette: Moderate enlargement of the cardiopericardial silhouette, similar.  Pleura: No  right-sided pleural effusion. The left pleural space is poorly evaluated secondary to the extent of cardiomegaly. Right hemidiaphragm elevation. No pneumothorax.  Lungs: Improvement in mild interstitial edema. Left greater than right bibasilar airspace disease. Grossly similar.  Other: None  IMPRESSION: Improvement in mild interstitial edema.  Cardiomegaly.  Cannot exclude pericardial effusion.  Bibasilar opacities. Favor atelectasis. At the left lung base, infection cannot be excluded.   Electronically Signed   By: Abigail Miyamoto M.D.   On: 03/17/2014 07:53   Dg Knee Complete 4 Views Left  02/27/2014   CLINICAL DATA:  Trauma yesterday with pain and swelling. History of left knee arthroplasty.  EXAM: LEFT KNEE - COMPLETE 4+ VIEW  COMPARISON:  12/26/2010  FINDINGS: Long-stem total knee arthroplasty. No acute fracture or dislocation. Cannot exclude small suprapatellar joint effusion. No acute hardware complication.  IMPRESSION: No  acute osseous abnormality. Total knee arthroplasty. Cannot exclude small suprapatellar joint effusion.   Electronically Signed   By: Abigail Miyamoto M.D.   On: 02/27/2014 15:18   Dg Abd Portable 1v  03/11/2014   CLINICAL DATA:  One day history of abdominal pain  EXAM: PORTABLE ABDOMEN - 1 VIEW  COMPARISON:  June 07, 2013  FINDINGS: There is moderate stool in the colon. Bowel gas pattern is unremarkable. No obstruction or free air is seen on this supine examination. There are small phleboliths in the pelvis. Liver appears prominent but stable.  IMPRESSION: Bowel gas pattern unremarkable.  Liver prominent but stable.   Electronically Signed   By: Lowella Grip M.D.   On: 03/11/2014 21:45    PHYSICAL EXAM CVP 7 General: NAD Neck: JVP 8 cm, no thyromegaly or thyroid nodule.  Lungs: Crackles at bases bilaterally.  CV: RV heave.  Heart regular S1/S2, no S3/S4, 2/6 HSM LLSB.  No peripheral edema.   Abdomen: Soft, nontender, no hepatosplenomegaly, no distention.  Neurologic: Alert and oriented x 3.  Psych: Normal affect. Extremities: No clubbing or cyanosis.   TELEMETRY: Reviewed telemetry pt in NSR  ASSESSMENT AND PLAN: 64 yo with history of PAH and scleroderma was admitted with right-sided heart failure and large pericardial effusion.  Treatment has been limited in past by multiple med intolerances and compliance issues.  1. Right-sided heart failure: RV moderately dilated/dysfunctional on echo with volume overload.  She  diuresed initially with milrinone for RV support/lowering of PA pressures, followed by rise in creatinine. Milrinone stopped 1/7.  --Weight trending back up. Creatinine improved off lasix => restart Lasix 40 mg po bid today, will go home on this dose.  2. PAH:  Severe PAH, PASP 111 mmHg by echo this admission, higher than in the past.  She had been on Letairis and Adcirca at home.  Adcirca held with low BP and sildenafil begun.  She is tolerating this so far.  RHC as above, PA  pressure lower than on echo.  Stopped milrinone 1/7.   - Will not be able to start on Tyvaso as inpatient, pulmonary office working on paperwork to get Tyvaso for her.     - Continue Letairis. Now on revatio 20 tid. Can transition back to Adcirca at d/c. Agree that she would be a poor IV epoprostenol candidate with med intolerances and compliance issues.  She did not like taking Tyvaso in the past but seemed to have had the best response to this agent.  Orenitram (po treprostinil) would be a consideration but trials in combination with PDE-5 inhibitors and endothelin antagonists did not show additive benefit from  orenitram. As above, pulmonary office working on [employer].   --Can consider newly released selexapeg (oral prostaglandin analogue) 3. Pericardial effusion: Large pericardial effusion without definite tamponade on echo, but difficult to tell hemodynamic effect given severe pulmonary hypertension with RV failure.  Often see pericardial effusion with severe pulmonary hypertension/RV failure.  Dr. Aundra Dubin did RHC/LHC with simultaneous left and right ventricular pressure tracings.  This showed respirophasic ventricular interdependence that is suggestive of possible tamponade physiology.  Difficult situation, she would be at relatively high risk for complications from pericardiocentesis with severe PAH.  Continue gentle diuresis as able => restart po Lasix today. Reassess effusion in several weeks.  4. 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 Hemoglobin up to 9.3 today. Off ASA. Continue Protonix bid, followup with GI.   5. Disposition: I think she can go home today with close followup.  Will see her in CHF clinic next week and pulmonary working on Tyvaso.     [name] MD 03/25/2014 7:44 AM

## 2014-03-25 NOTE — Discharge Summary (Signed)
Physician Discharge Summary       Patient ID: [name] MRN: [id number] DOB/AGE: Aug 13, 1950 64 y.o.  Admit date: 03/11/2014 Discharge date: 03/25/2014  Discharge Diagnoses:  Scleroderma Severe Pulmonary Artery Hypertension Class 4 Dyspnea  Pericardial Effusion Acute on Chronic Diastolic Heart Failure  Hypotension (resolved) Acute Blood Loss Anemia  Duodenum Arterial Venous Malformation  Chronic Renal Insufficiency   Detailed Hospital Course:    64 year old woman with scleroderma and severe secondary pulmonary hypertension. Initiated [name], Adcirca in the past but unable to tolerate. Finally started on inhaled Tyvaso in 2010. Some delay in titrating up to goal dosing of 9 puffs 4x a day due to compliance issues, but finally able to do so. This was then stopped on her OWN in Summer 2012 during admission for septic knee hardware due to convenience issues. Subsequently Rx with with bosentan + Coumadin even through 2013 when she had rising PASP and despite advise resisted Tyvaso. In mid-2014 due to compliance with labs came off both bosentan and coumadin and by this time dyspnea was class 3. Then throuh Fall 2014 and Spring 2015 Rx with macitentan but stopped due to diarrhea. Following this in March 2015 admitted for decompensated corpulmonale and then started on ADCIRCA at this time. By Fall (oct) 2015 LETAIRIS was added.   Admitted 03/11/2014 with few to several days of worsening dyspnea from class 3 to 4, and worsening orthopnea (baseline 3 pillow to needing to sit in bed). Unsure if she has edema worsening; she says no. Insisted she was compliant with Letairis and "brown pill" ? Adcirca. Eval since admit has shown bilateral small pleural effusions, new moderate to large pericardial effusion with worsening PASP. Cards unsure if of tamponade due to [facility] physiology on echo. PCCM asked to help with dyspnea, PAH mgmt. PE had been ruled by normal VQ (d-dimer high).  In patient course  high-lights included:    12/28: admitted to hospital w/ decompensated Cor Pulmonale and diastolic Dysfunction. Treated with changing Letairis to Revatio given shorter 1/2 life, oxygen and supportive care.   12/29 ECHO: Wall thickness was increased in a pattern of moderate LVH. Systolic function was normal. The estimated ejection fraction was in the range of 60% to 65%. Wall motion was normal; there were no regional wall motion abnormalities. Doppler parameters are consistent with abnormal left ventricular relaxation (grade 1 diastolic dysfunction).PA peak pressure: 111 mm Hg (S). Pericardium, extracardiac: A large, free-flowing pericardialeffusion was identified circumferential to the heart.   1/1: placed on Milrinone given feeling that her decompensation was mostly right heart failure and NOT related to the pericardial effusion. Diuresis initiated.   1/4: Making clinical progress w/ Diuresis and inotrope therapy. Creatinine was increasing so lasix was held.   Repeat ECHO: Systolic function was normal. The estimated ejection fraction was in the range of 55%to 60%. Wall motion was normal; there were no regional wallmotion abnormalities. Doppler parameters are consistent with abnormal left ventricular relaxation (grade 1 diastolic dysfunction). PA systolic pressure only measured at 45 mmHg but may beunderestimated. There was a large pericardial effusion.   1/4 right heart Cath: RA mean 12;RV 64/14;PA 68/29, mean 42;PCWP mean 12;LV 102/14 AO 105/53. There was respirophasic ventricular interdependence noted with simultaneous RV and LV pressure tracings (LV systolic pressure fell as RV systolic pressure rose and vice versa). Oxygen saturations: PA 67%;AO 93%;Cardiac Output (Fick) 6.63 ;Cardiac Index (Fick) 3.88 ;PVR 4.5 WU  1/7: Milrinone stopped. Final decision made to continue diuresis and NOT pursue pericardiocentesis.  1/8: developed acute blood clots in stool Hgb drifted down from 8.1 to 5.9. Got  2 units PRBC. GI consulted.   1/9: EGD: 1. 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 The EGD otherwise appeared normal RECOMMENDATIONS: 1. Continue PPI bid for now 2. Avoid ASA/NSAIDS for at least two weeks 3. Avoid anticoagulants, if possible, with known colonic and duodenal AVMs 4. GI follow up with Dr. Doretha Sou as needed  1/10: got 2 more units blood (hgb from 7-->6.7), weight increased, lasix resumed.   1/11: hgb stable. 9.3. Ambulating on Room Air with PT. Weight stable.  Filed Weights   03/23/14 0400 03/24/14 0330 03/25/14 0300  Weight: 65.4 kg (144 lb 2.9 oz) 66.1 kg (145 lb 11.6 oz) 66.225 kg (146 lb)  Ready for discharge.    Discharge Plan by active problems  Pulmonary Scleroderma w/ Severe PAH  Plan Home on adcirca + letaris. Then will plan to start Tyvaso as an outpt  Pericardial effusion  Diastolic Dysfunction  Cor Pulmonale  Plan  Will need repeat eval in a few weeks. Difficult to determine the hemodynamic effects of an effusion this large especially in setting PAH Continue Lasix   Acute blood loss anemia: EGD w/ AVM second part od duodenum on 1/9 Plan No NSAIDs/ASA x 2 weeks Avoid anticoagulation  PPI BID  CRI  Plan F/u chemistry    Significant Hospital tests/ studies  Consults: cardiology   Discharge Exam: BP 113/42 mmHg  Pulse 74  Temp(Src) 98.4 F (36.9 C) (Oral)  Resp 16  Ht _0  (1.626 m)  Wt 66.225 kg (146 lb)  BMI 25.05 kg/m2  SpO2 96%  NAD HEENT WNL Chest posterior rales very faint  Distant HS, no M abd soft, +BS Ext warm without edema  Labs at discharge Lab Results  Component Value Date   CREATININE 1.51* 03/25/2014   BUN 45* 03/25/2014   NA 136 03/25/2014   K 3.8 03/25/2014   CL 106 03/25/2014   CO2 22 03/25/2014   Lab Results  Component Value Date   WBC 8.7 03/25/2014   HGB 9.3* 03/25/2014   HCT 28.3* 03/25/2014   MCV 84.0 03/25/2014    PLT 193 03/25/2014   Lab Results  Component Value Date   ALT 11 03/24/2014   AST 27 03/24/2014   ALKPHOS 91 03/24/2014   BILITOT 0.9 03/24/2014   Lab Results  Component Value Date   INR 1.19 03/17/2014   INR 1.46 12/26/2013   INR 1.68* 06/08/2013   PROTIME 18.7 08/20/2008    Current radiology studies Dg Chest Port 1 View  03/25/2014   CLINICAL DATA:  Pneumonia.  EXAM: PORTABLE CHEST - 1 VIEW  COMPARISON:  03/19/2014.  FINDINGS: Right PICC line stable position . Mediastinum and hilar structures normal. Severe cardiomegaly. Pulmonary vascularity normal. Right base infiltrate. No pleural effusion or pneumothorax. No acute osseus abnormality.  IMPRESSION: 1. Right PICC line in stable position. 2. Right base pulmonary infiltrate. 3. Stable severe cardiomegaly.   Electronically Signed   By: Marcello Moores  Register   On: 03/25/2014 07:29    Disposition:  01-Home or Self Care      Discharge Instructions    Diet - low sodium heart healthy    Complete by:  As directed      Discharge instructions    Complete by:  As directed   Tyvaso as an outpt (this has been arranged thru the office) Continue daily weights  AVOID ALL NSAIDS: No aspirin, ibuprofen, Aleve, Goodies powders, Advil ETC... If you are unsure if a drug is an NSAID please contact your Pharmacy     Increase activity slowly    Complete by:  As directed             Medication List    STOP taking these medications        aspirin 81 MG EC tablet      TAKE these medications        ADCIRCA 20 MG Tabs  Generic drug:  Tadalafil (PAH)  Take 40 mg by mouth daily.     ambrisentan 10 MG tablet  Commonly known as:  LETAIRIS  Take 1 tablet (10 mg total) by mouth daily.     [facility] IRON 325 (65 FE) MG tablet  Generic drug:  ferrous sulfate  Take 325 mg by mouth daily with breakfast.     esomeprazole 40 MG capsule  Commonly known as:  NEXIUM  Take by mouth 2 (two) times daily.     fenofibrate 54 MG tablet  Take 1 tablet (54 mg  total) by mouth daily.     FLUoxetine 20 MG capsule  Commonly known as:  PROZAC  take 1 capsule by mouth every morning     furosemide 40 MG tablet  Commonly known as:  LASIX  Take 1 tablet (40 mg total) by mouth 2 (two) times daily.     gabapentin 100 MG capsule  Commonly known as:  NEURONTIN  Take 1 capsule (100 mg total) by mouth 2 (two) times daily.     HYDROcodone-acetaminophen 10-325 MG per tablet  Commonly known as:  NORCO  Take 1 tablet by mouth every 6 (six) hours as needed for moderate pain.     pregabalin 150 MG capsule  Commonly known as:  LYRICA  Take 1 capsule (150 mg total) by mouth 3 (three) times daily.     promethazine 12.5 MG tablet  Commonly known as:  PHENERGAN  Take 1 tablet (12.5 mg total) by mouth every 6 (six) hours as needed for nausea or vomiting.       Follow-up Information    Follow up with [name], MD On 04/01/2014.   Specialty:  Cardiology   Why:  11:30 Garage Code [id number]   Contact information:   [address]  [place] [address] [phone]       Follow up with [facility], NP On 04/08/2014.   Specialty:  Nurse Practitioner   Why:  2pm    Contact information:   [address] [address] [phone]       Discharged Condition: good  Physician Statement:   The Patient was personally examined, the discharge assessment and plan has been personally reviewed and I agree with ACNP Babcock's assessment and plan. > 30 minutes of time have been dedicated to discharge assessment, planning and discharge instructions.   SignedMarni Griffon 03/25/2014, 10:06 AM   Baltazar Apo, MD, PhD 03/25/2014, 3:03 PM [facility] [phone] or if no answer [phone]

## 2014-03-25 NOTE — Progress Notes (Signed)
Discussed HF, low sodium, daily wts, walking with pt and son. Pt not very receptive. Sts she doesn't like to walk and seems overwhelmed by eating low sodium food choices. Gave resources for low sodium and HF. Expect poor compliance.  [phone] [place], ACSM 11:01 AM 03/25/2014

## 2014-03-26 NOTE — Telephone Encounter (Signed)
Called and spoke with Janett Billow at [facility] is not in office today. Made Jessica aware of update on Letaris and Tyvaso. Janett Billow states that the patient has been D/C and has an appt with our office 04/08/14 with TP.   Called pt to check on supply of medication. LM for pt to return call x 1

## 2014-03-26 NOTE — Telephone Encounter (Signed)
Butch Penny called back and stated they have tried to reach the pt to start therapy but the pt has not returned the call. Informed pt to call the number Butch Penny suggested [phone]). Pt verbalized understanding and denied any further questions or concerns at this time.   Will forward to Corinne to continue to follow.

## 2014-03-26 NOTE — Telephone Encounter (Signed)
Called and spoke to pt. Informed pt that the Letaris rx has been faxed to [facility] on [address]. Pt stated she has not heard any update on the Tyvaso.   Called and spoke to [place] at [employer] and questioned update with pt's Tyvaso. Butch Penny stated she would check on status and call back.

## 2014-03-26 NOTE — Telephone Encounter (Signed)
Patient states she was d/c from the hospital yesterday.  She has questions about her medications.  [phone]

## 2014-03-27 NOTE — Telephone Encounter (Signed)
Butch Penny calling back to let you know pt was called by elise, pt then called [facility] and they will be starting pt on treatment or medication on Thursday.Hillery Hunter

## 2014-03-27 NOTE — Telephone Encounter (Signed)
Noted. Will route to RB so that he will be aware.

## 2014-03-27 NOTE — Telephone Encounter (Signed)
Ok thanks 

## 2014-03-28 DIAGNOSIS — M359 Systemic involvement of connective tissue, unspecified: Secondary | ICD-10-CM | POA: Diagnosis not present

## 2014-03-28 DIAGNOSIS — I272 Other secondary pulmonary hypertension: Secondary | ICD-10-CM | POA: Diagnosis not present

## 2014-03-31 ENCOUNTER — Other Ambulatory Visit: Payer: Self-pay | Admitting: Nurse Practitioner

## 2014-04-01 ENCOUNTER — Ambulatory Visit (HOSPITAL_COMMUNITY)
Admission: RE | Admit: 2014-04-01 | Discharge: 2014-04-01 | Disposition: A | Discharge status: Home / Self Care | Payer: Medicare Other | Source: Ambulatory Visit | Attending: Internal Medicine | Admitting: Internal Medicine

## 2014-04-01 ENCOUNTER — Other Ambulatory Visit: Payer: Self-pay

## 2014-04-01 VITALS — BP 80/42 | HR 82 | Wt 140.5 lb

## 2014-04-01 DIAGNOSIS — E669 Obesity, unspecified: Secondary | ICD-10-CM | POA: Diagnosis not present

## 2014-04-01 DIAGNOSIS — K3184 Gastroparesis: Secondary | ICD-10-CM | POA: Diagnosis not present

## 2014-04-01 DIAGNOSIS — I313 Pericardial effusion (noninflammatory): Secondary | ICD-10-CM | POA: Insufficient documentation

## 2014-04-01 DIAGNOSIS — I129 Hypertensive chronic kidney disease with stage 1 through stage 4 chronic kidney disease, or unspecified chronic kidney disease: Secondary | ICD-10-CM | POA: Insufficient documentation

## 2014-04-01 DIAGNOSIS — I2721 Secondary pulmonary arterial hypertension: Secondary | ICD-10-CM

## 2014-04-01 DIAGNOSIS — E785 Hyperlipidemia, unspecified: Secondary | ICD-10-CM | POA: Insufficient documentation

## 2014-04-01 DIAGNOSIS — K219 Gastro-esophageal reflux disease without esophagitis: Secondary | ICD-10-CM | POA: Insufficient documentation

## 2014-04-01 DIAGNOSIS — Z9119 Patient's noncompliance with other medical treatment and regimen: Secondary | ICD-10-CM | POA: Insufficient documentation

## 2014-04-01 DIAGNOSIS — I509 Heart failure, unspecified: Secondary | ICD-10-CM | POA: Insufficient documentation

## 2014-04-01 DIAGNOSIS — I50810 Right heart failure, unspecified: Secondary | ICD-10-CM

## 2014-04-01 DIAGNOSIS — N183 Chronic kidney disease, stage 3 (moderate): Secondary | ICD-10-CM | POA: Diagnosis not present

## 2014-04-01 DIAGNOSIS — I272 Other secondary pulmonary hypertension: Secondary | ICD-10-CM | POA: Insufficient documentation

## 2014-04-01 DIAGNOSIS — Z79899 Other long term (current) drug therapy: Secondary | ICD-10-CM | POA: Diagnosis not present

## 2014-04-01 DIAGNOSIS — E119 Type 2 diabetes mellitus without complications: Secondary | ICD-10-CM | POA: Diagnosis not present

## 2014-04-01 DIAGNOSIS — I5032 Chronic diastolic (congestive) heart failure: Secondary | ICD-10-CM | POA: Diagnosis not present

## 2014-04-01 LAB — BASIC METABOLIC PANEL
Anion gap: 13 (ref 5–15)
BUN: 52 mg/dL — ABNORMAL HIGH (ref 6–23)
CHLORIDE: 107 meq/L (ref 96–112)
CO2: 18 mmol/L — ABNORMAL LOW (ref 19–32)
CREATININE: 1.47 mg/dL — AB (ref 0.50–1.10)
Calcium: 9.9 mg/dL (ref 8.4–10.5)
GFR calc Af Amer: 43 mL/min — ABNORMAL LOW (ref >90)
GFR calc non Af Amer: 37 mL/min — ABNORMAL LOW (ref >90)
GLUCOSE: 101 mg/dL — AB (ref 70–99)
POTASSIUM: 4.5 mmol/L (ref 3.5–5.1)
Sodium: 138 mmol/L (ref 135–145)

## 2014-04-01 LAB — CBC
HCT: 35.5 % — ABNORMAL LOW (ref 36.0–46.0)
Hemoglobin: 11.8 g/dL — ABNORMAL LOW (ref 12.0–15.0)
MCH: 27.3 pg (ref 26.0–34.0)
MCHC: 33.2 g/dL (ref 30.0–36.0)
MCV: 82 fL (ref 78.0–100.0)
Platelets: 328 K/uL (ref 150–400)
RBC: 4.33 MIL/uL (ref 3.87–5.11)
RDW: 16.4 % — ABNORMAL HIGH (ref 11.5–15.5)
WBC: 8 K/uL (ref 4.0–10.5)

## 2014-04-01 MED ORDER — PREGABALIN 150 MG PO CAPS: 150.0000 mg | ORAL_CAPSULE | Freq: Three times a day (TID) | ORAL | Status: DC

## 2014-04-01 NOTE — Patient Instructions (Signed)
HOLD Lasix for two days, restart on Thursday 04/04/2014  Labs today  Your physician recommends that you schedule a follow-up appointment in: 1 month  Do the following things EVERYDAY: 1) Weigh yourself in the morning before breakfast. Write it down and keep it in a log. 2) Take your medicines as prescribed 3) Eat low salt foods-Limit salt (sodium) to 2000 mg per day.  4) Stay as active as you can everyday 5) Limit all fluids for the day to less than 2 liters 6)

## 2014-04-01 NOTE — Progress Notes (Signed)
Patient ID: DARCHELLE NUNES, female   DOB: Jun 04, 1950, 64 y.o.   MRN: [id number] PCP: Primary Cardiologist: Dr Haroldine Laws Pulmonary: Dr Lamonte Sakai   HPI: Ms. Nesheim is a 64 yo female with a history of DM2, HLD, HTN, pHTN, GERD, gastroparesis, scleroderma, DJD and diastolic HF.   Of note she has been on several PAH targeted therapies and coumadin over last 9 years, but has not been reliably on therapy due to noncompliance and inability to have labs performed. She has had best response to Tyvaso (+ bosentan) with an improvement in 6 minute walk and in PASP from 102 mmHg (7/09) to 42 mmHg (9/10). After stopping Tyvaso her PASP rose to 80's by TTE 6/13 and 11/14. Surprisingly, she has never required supplemental O2. Macitentan was started in 11/14. She developed nausea and diarrhea and macitentan was stopped early 3/15. Most recently, she had been on Letairis and Adcirca (followed by Dr. Lamonte Sakai).   Admitted 10/14-10/18/15 for A/C RHF, volume overload and syncope. Diuresed with IV lasix and milrinone a total of 26 lbs. Discharge weight was 156 lbs and started on lasix 40 mg BID.   Admitted 12/29 with dyspnea and chest pain. BP was low, and Lasix and Adcirca were both initially held. Echo showed EF 60-65% with moderately dilated and dysfunctional RV, PASP 111 mmHg with moderate TR. There was a large pericardial effusion without definite tamponade. Presentation was suspected to be due to progressive PAH with secondary RV failure rather than tamponade. No pericardiocentesis yet. She was started on milrinone gtt and diuresed. Shorter-acting sildenafil was started instead of Adcirca. Also had GI bleed. 1/10 underwent EGD with laser coagulation of bleeding AVM in 2nd part of duodenum. Discharge weight was 146 pounds.   She returns for post hospital follow up. Denies SOB. Having abdominal discomfort. Weight at home 138 pounds. Having intermittment presyncope. Poor po intake. Having N/V/D over the last 24 hours.  Does  not wear oxygen. Taking letaris and adcirca. Started Tyvaso 1/14.   RHC./LHC (1/4):  Hemodynamics (mmHg) RA mean 12 RV 64/14 PA 68/29, mean 42 PCWP mean 12 LV 102/14 AO 105/53 PA 67% AO 93% Cardiac Output (Fick) 6.63  Cardiac Index (Fick) 3.88  PVR 4.5 WU  Labs 03/24/2014 K 3.8 Creatinine 1.51 Hgb 9.3     ROS: All systems negative except as listed in HPI, PMH and Problem List.  SH:  History   Social History  . Marital Status: Legally Separated    Spouse Name: N/A    Number of Children: 2  . Years of Education: 11   Occupational History  . Umemployed   . Disability     Social History Main Topics  . Smoking status: Never Smoker   . Smokeless tobacco: Never Used  . Alcohol Use: No  . Drug Use: No  . Sexual Activity: Not on file   Other Topics Concern  . Not on file   Social History Narrative    FAMILY HISTORY:  Significant for coronary artery disease and diabetes   Patient lives at home with granddaughter.    Patient has 2 children.    Patient has 11 years of education.    Patient is on disability.    Patient is right handed.    Patient is separated.      FH:  Family History  Problem Relation Age of Onset  . Heart disease Mother   . Diabetes Mother   . Diabetes Sister     Past Medical History  Diagnosis Date  .  Secondary pulmonary hypertension     right heart cath 04/20/04  . Cough   . Allergic rhinitis, cause unspecified   . Esophageal reflux   . Systemic sclerosis   . Unspecified essential hypertension   . Gastritis   . Sickle cell trait     "trace"  . Obesity   . Visual changes   . Scleroderma   . Diastolic dysfunction   . Trichomonas   . Vaginal bleeding   . Neuropathy   . CHF (congestive heart failure)   . Type II diabetes mellitus     DIET CONTROL   . Bursitis   . History of blood transfusion     "think it was related to when I had partial hysteretomy; might have been for one of my knee ORs"  . Degeneration of lumbar or  lumbosacral intervertebral disc   . Arthritis     "all over my body" (08/04/2013)  . Chronic kidney disease     "been in hospital for it; don't know what kind" (08/04/2013)    Current Outpatient Prescriptions  Medication Sig Dispense Refill  . ambrisentan (LETAIRIS) 10 MG tablet Take 1 tablet (10 mg total) by mouth daily. 30 tablet 11  . esomeprazole (NEXIUM) 40 MG capsule Take by mouth 2 (two) times daily.      . fenofibrate 54 MG tablet Take 1 tablet (54 mg total) by mouth daily. 30 tablet 12  . ferrous sulfate ([facility]S IRON) 325 (65 FE) MG tablet Take 325 mg by mouth daily with breakfast.    . FLUoxetine (PROZAC) 20 MG capsule take 1 capsule by mouth every morning 30 capsule 2  . furosemide (LASIX) 40 MG tablet Take 1 tablet (40 mg total) by mouth 2 (two) times daily. 60 tablet 3  . gabapentin (NEURONTIN) 100 MG capsule Take 1 capsule (100 mg total) by mouth 2 (two) times daily. 60 capsule 11  . HYDROcodone-acetaminophen (NORCO) 10-325 MG per tablet Take 1 tablet by mouth every 6 (six) hours as needed for moderate pain.     . pregabalin (LYRICA) 150 MG capsule Take 1 capsule (150 mg total) by mouth 3 (three) times daily. 90 capsule 5  . promethazine (PHENERGAN) 12.5 MG tablet Take 1 tablet (12.5 mg total) by mouth every 6 (six) hours as needed for nausea or vomiting. 45 tablet 1  . Tadalafil, PAH, (ADCIRCA) 20 MG TABS Take 40 mg by mouth daily.     No current facility-administered medications for this encounter.    Filed Vitals:   04/01/14 1138  BP: 80/42  Pulse: 82  Weight: 140 lb 8 oz (63.73 kg)  SpO2: 96%    PHYSICAL EXAM:  General: NAD Neck: JVP flat, no thyromegaly or thyroid nodule.  Lungs: Clear.  CV: RV heave. Heart regular S1/S2, no S3/S4, 2/6 HSM LLSB. No peripheral edema.  Abdomen: Soft, nontender, no hepatosplenomegaly, no distention.  Neurologic: Alert and oriented x 3.  Psych: Normal affect. Extremities: No clubbing or cyanosis.     ASSESSMENT &  PLAN: 64 yo with history of PAH and scleroderma was admitted with right-sided heart failure and large pericardial effusion. Treatment has been limited in past by multiple med intolerances and compliance issues.   1. Chronic R Heart Failure Echo 03/2014 showed EF 60-65% with moderately dilated and dysfunctional RV, PASP 111 mmHg with moderate TR. There was a large pericardial effusion without definite tamponade. Appears dry. Weight down another 6 pounds since discharge.  Hold lasix for 2 days then restart on  Thursday.  2. PAH with cor pulmonale-Severe PAH, 03/2014 PASP 111 mmHg by echo. Class III-IV.  On letaris and adcrica. Started onTyvaso last Thursday.   3. CKD III- Check BMET  4. N/V/D- Frequent loose stool. Check BMET now. ? Dehydration.  5. 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   Follow up in 2-3 weeks.   CLEGG,AMY NP-C  12:17 PM   Patient seen and examined with Darrick Grinder, NP. We discussed all aspects of the encounter. I agree with the assessment and plan as stated above.   She continues to struggle. Just started on Tyvaso. Now having n/v/d. May be side effect of Tyvaso but I doubt it. Will hold lasix for 2 days. Resume on Thursday. If n/v/d persists will need f/u with PCP or ER to check for C. Diff. If unable to tolerate Tyvaso can consider Selexipag.   Daniel Bensimhon,MD 1:22 PM

## 2014-04-03 ENCOUNTER — Encounter (HOSPITAL_COMMUNITY): Payer: Medicare Other

## 2014-04-04 DIAGNOSIS — I272 Other secondary pulmonary hypertension: Secondary | ICD-10-CM | POA: Diagnosis not present

## 2014-04-04 DIAGNOSIS — M359 Systemic involvement of connective tissue, unspecified: Secondary | ICD-10-CM | POA: Diagnosis not present

## 2014-04-08 ENCOUNTER — Inpatient Hospital Stay: Payer: Medicare Other | Admitting: Adult Health

## 2014-04-08 ENCOUNTER — Telehealth: Payer: Self-pay | Admitting: Internal Medicine

## 2014-04-08 NOTE — Telephone Encounter (Signed)
Spoke with pt.  Discussed below recs per Dr. Lamonte Sakai She verbally read back instructions.  She is to call office back if experiencing problems with the restart of medication and is to call back with an update so we can provide further instructions on increasing medication.  Pt is in agreement with plan and voiced no further questions or concerns at this time.

## 2014-04-08 NOTE — Telephone Encounter (Signed)
I agree with starting the medication back. Have her start 1 puff qid for 3 days, then go to 2 puffs qid and stay there for a week. After a week we can decide whether and how rapidly to increase

## 2014-04-08 NOTE — Telephone Encounter (Signed)
Spoke with pt: 1.  Reports she's been taking Tyvaso for approx 1 month now.  She was up to 4 puffs qid.  On Saturday, she had N/V/D so she stopped the Tyvaso believing symptoms were from the medication.  Denies f/c/s.  States stomach is still weak and still has "some" diarrhea.  Breathing is unchanged.  Pt spoke with the Tyvaso nurse today, who per pt, advised her to restart taking the Tyvaso at 1 puff qid today -- pt would like RB's recs on this.  Please advise.  Thank you. 2.  Pt states [facility] would not ship Adcirca because of her deducible.  She spoke with [facility] who will take care of the deductible.  Pt reports [facility] advised [facility] will be shipping Adcirca today and should receive tomorrow.  Pt currently using samples of medication.  Pt states this is FYI for office only.  Dr. Lamonte Sakai, pls advise of Tyvaso.  Thank you.

## 2014-04-18 DIAGNOSIS — M359 Systemic involvement of connective tissue, unspecified: Secondary | ICD-10-CM | POA: Diagnosis not present

## 2014-04-18 DIAGNOSIS — I272 Other secondary pulmonary hypertension: Secondary | ICD-10-CM | POA: Diagnosis not present

## 2014-04-19 ENCOUNTER — Encounter: Payer: Self-pay | Admitting: Adult Health

## 2014-04-19 ENCOUNTER — Other Ambulatory Visit (INDEPENDENT_AMBULATORY_CARE_PROVIDER_SITE_OTHER): Payer: Medicare Other

## 2014-04-19 ENCOUNTER — Ambulatory Visit (INDEPENDENT_AMBULATORY_CARE_PROVIDER_SITE_OTHER)
Admission: RE | Admit: 2014-04-19 | Discharge: 2014-04-19 | Disposition: A | Discharge status: Home / Self Care | Payer: Medicare Other | Source: Ambulatory Visit | Attending: Adult Health | Admitting: Adult Health

## 2014-04-19 ENCOUNTER — Ambulatory Visit (INDEPENDENT_AMBULATORY_CARE_PROVIDER_SITE_OTHER): Payer: Medicare Other | Admitting: Adult Health

## 2014-04-19 VITALS — BP 98/50 | HR 75 | Temp 97.8°F | Ht 64.0 in | Wt 149.6 lb

## 2014-04-19 DIAGNOSIS — J189 Pneumonia, unspecified organism: Secondary | ICD-10-CM | POA: Diagnosis not present

## 2014-04-19 DIAGNOSIS — I272 Other secondary pulmonary hypertension: Secondary | ICD-10-CM

## 2014-04-19 DIAGNOSIS — I2721 Secondary pulmonary arterial hypertension: Secondary | ICD-10-CM

## 2014-04-19 DIAGNOSIS — I517 Cardiomegaly: Secondary | ICD-10-CM | POA: Diagnosis not present

## 2014-04-19 LAB — CBC WITH DIFFERENTIAL/PLATELET
BASOS ABS: 0 10*3/uL (ref 0.0–0.1)
Basophils Relative: 0.4 % (ref 0.0–3.0)
Eosinophils Absolute: 0.1 10*3/uL (ref 0.0–0.7)
Eosinophils Relative: 2.6 % (ref 0.0–5.0)
HCT: 33.2 % — ABNORMAL LOW (ref 36.0–46.0)
Hemoglobin: 10.9 g/dL — ABNORMAL LOW (ref 12.0–15.0)
LYMPHS ABS: 1.5 10*3/uL (ref 0.7–4.0)
Lymphocytes Relative: 27 % (ref 12.0–46.0)
MCHC: 32.9 g/dL (ref 30.0–36.0)
MCV: 81.7 fl (ref 78.0–100.0)
MONO ABS: 0.5 10*3/uL (ref 0.1–1.0)
MONOS PCT: 9.7 % (ref 3.0–12.0)
Neutro Abs: 3.4 10*3/uL (ref 1.4–7.7)
Neutrophils Relative %: 60.3 % (ref 43.0–77.0)
RBC: 4.07 Mil/uL (ref 3.87–5.11)
RDW: 17.3 % — AB (ref 11.5–15.5)
WBC: 7.6 10*3/uL (ref 4.0–10.5)

## 2014-04-19 LAB — BASIC METABOLIC PANEL
BUN: 54 mg/dL — ABNORMAL HIGH (ref 6–23)
CHLORIDE: 106 meq/L (ref 96–112)
CO2: 22 mEq/L (ref 19–32)
Calcium: 9.9 mg/dL (ref 8.4–10.5)
Creatinine, Ser: 1.4 mg/dL — ABNORMAL HIGH (ref 0.40–1.20)
GFR: 48.91 mL/min — ABNORMAL LOW (ref >60.00)
Glucose, Bld: 96 mg/dL (ref 70–99)
Potassium: 4.3 mEq/L (ref 3.5–5.1)
SODIUM: 138 meq/L (ref 135–145)

## 2014-04-19 LAB — HEPATIC FUNCTION PANEL
ALK PHOS: 135 U/L — AB (ref 39–117)
ALT: 11 U/L (ref 0–35)
AST: 37 U/L (ref 0–37)
Albumin: 4 g/dL (ref 3.5–5.2)
Bilirubin, Direct: 0.1 mg/dL (ref 0.0–0.3)
TOTAL PROTEIN: 8.4 g/dL — AB (ref 6.0–8.3)
Total Bilirubin: 0.6 mg/dL (ref 0.2–1.2)

## 2014-04-19 NOTE — Patient Instructions (Signed)
Remain on Tyvaso 7 puffs Four times a day  For 1 week then 8 puffs Four times a day  For 1 week then 9 puffs Four times a day  -hold at this dose.  Continue on Letaris and Adcirca .  Follow up Dr. Byrum  In 4 weeks and As needed   Labs and chest xray today  Please contact office for sooner follow up if symptoms do not improve or worsen or seek emergency care   

## 2014-04-22 ENCOUNTER — Encounter: Payer: Self-pay | Admitting: *Deleted

## 2014-04-25 DIAGNOSIS — I272 Other secondary pulmonary hypertension: Secondary | ICD-10-CM | POA: Diagnosis not present

## 2014-04-25 DIAGNOSIS — M359 Systemic involvement of connective tissue, unspecified: Secondary | ICD-10-CM | POA: Diagnosis not present

## 2014-04-26 ENCOUNTER — Emergency Department (HOSPITAL_COMMUNITY): Payer: Medicare Other

## 2014-04-26 ENCOUNTER — Emergency Department (HOSPITAL_COMMUNITY)
Admission: EM | Admit: 2014-04-26 | Discharge: 2014-04-26 | Disposition: A | Discharge status: Home / Self Care | Payer: Medicare Other | Attending: Emergency Medicine | Admitting: Emergency Medicine

## 2014-04-26 ENCOUNTER — Encounter (HOSPITAL_COMMUNITY): Payer: Self-pay | Admitting: *Deleted

## 2014-04-26 DIAGNOSIS — Z8619 Personal history of other infectious and parasitic diseases: Secondary | ICD-10-CM | POA: Insufficient documentation

## 2014-04-26 DIAGNOSIS — I272 Other secondary pulmonary hypertension: Secondary | ICD-10-CM | POA: Diagnosis not present

## 2014-04-26 DIAGNOSIS — Z8669 Personal history of other diseases of the nervous system and sense organs: Secondary | ICD-10-CM | POA: Diagnosis not present

## 2014-04-26 DIAGNOSIS — K2970 Gastritis, unspecified, without bleeding: Secondary | ICD-10-CM | POA: Insufficient documentation

## 2014-04-26 DIAGNOSIS — I517 Cardiomegaly: Secondary | ICD-10-CM | POA: Diagnosis not present

## 2014-04-26 DIAGNOSIS — Z9889 Other specified postprocedural states: Secondary | ICD-10-CM | POA: Diagnosis not present

## 2014-04-26 DIAGNOSIS — R918 Other nonspecific abnormal finding of lung field: Secondary | ICD-10-CM | POA: Diagnosis not present

## 2014-04-26 DIAGNOSIS — Z79899 Other long term (current) drug therapy: Secondary | ICD-10-CM | POA: Diagnosis not present

## 2014-04-26 DIAGNOSIS — N189 Chronic kidney disease, unspecified: Secondary | ICD-10-CM | POA: Insufficient documentation

## 2014-04-26 DIAGNOSIS — R062 Wheezing: Secondary | ICD-10-CM | POA: Diagnosis not present

## 2014-04-26 DIAGNOSIS — E119 Type 2 diabetes mellitus without complications: Secondary | ICD-10-CM | POA: Diagnosis not present

## 2014-04-26 DIAGNOSIS — M1990 Unspecified osteoarthritis, unspecified site: Secondary | ICD-10-CM | POA: Insufficient documentation

## 2014-04-26 DIAGNOSIS — I129 Hypertensive chronic kidney disease with stage 1 through stage 4 chronic kidney disease, or unspecified chronic kidney disease: Secondary | ICD-10-CM | POA: Diagnosis not present

## 2014-04-26 DIAGNOSIS — R0602 Shortness of breath: Secondary | ICD-10-CM | POA: Diagnosis not present

## 2014-04-26 DIAGNOSIS — E669 Obesity, unspecified: Secondary | ICD-10-CM | POA: Insufficient documentation

## 2014-04-26 DIAGNOSIS — K219 Gastro-esophageal reflux disease without esophagitis: Secondary | ICD-10-CM | POA: Diagnosis not present

## 2014-04-26 DIAGNOSIS — Z862 Personal history of diseases of the blood and blood-forming organs and certain disorders involving the immune mechanism: Secondary | ICD-10-CM | POA: Insufficient documentation

## 2014-04-26 DIAGNOSIS — R0989 Other specified symptoms and signs involving the circulatory and respiratory systems: Secondary | ICD-10-CM | POA: Diagnosis not present

## 2014-04-26 DIAGNOSIS — I5032 Chronic diastolic (congestive) heart failure: Secondary | ICD-10-CM | POA: Diagnosis not present

## 2014-04-26 LAB — CBC
HCT: 28.6 % — ABNORMAL LOW (ref 36.0–46.0)
Hemoglobin: 9.2 g/dL — ABNORMAL LOW (ref 12.0–15.0)
MCH: 26.2 pg (ref 26.0–34.0)
MCHC: 32.2 g/dL (ref 30.0–36.0)
MCV: 81.5 fL (ref 78.0–100.0)
Platelets: 168 K/uL (ref 150–400)
RBC: 3.51 MIL/uL — ABNORMAL LOW (ref 3.87–5.11)
RDW: 16 % — ABNORMAL HIGH (ref 11.5–15.5)
WBC: 5.2 K/uL (ref 4.0–10.5)

## 2014-04-26 LAB — BASIC METABOLIC PANEL WITH GFR
Anion gap: 6 (ref 5–15)
BUN: 41 mg/dL — ABNORMAL HIGH (ref 6–23)
CO2: 26 mmol/L (ref 19–32)
Calcium: 9.5 mg/dL (ref 8.4–10.5)
Chloride: 109 mmol/L (ref 96–112)
Creatinine, Ser: 1.27 mg/dL — ABNORMAL HIGH (ref 0.50–1.10)
GFR calc Af Amer: 51 mL/min — ABNORMAL LOW (ref >90)
GFR calc non Af Amer: 44 mL/min — ABNORMAL LOW (ref >90)
Glucose, Bld: 79 mg/dL (ref 70–99)
Potassium: 4.4 mmol/L (ref 3.5–5.1)
Sodium: 141 mmol/L (ref 135–145)

## 2014-04-26 LAB — POC OCCULT BLOOD, ED: Fecal Occult Bld: NEGATIVE

## 2014-04-26 LAB — BRAIN NATRIURETIC PEPTIDE: B Natriuretic Peptide: 120.9 pg/mL — ABNORMAL HIGH (ref 0.0–100.0)

## 2014-04-26 LAB — TROPONIN I: Troponin I: <0.03 ng/mL (ref <0.031)

## 2014-04-26 MED ORDER — FUROSEMIDE 20 MG PO TABS
60.0000 mg | ORAL_TABLET | Freq: Once | ORAL | Status: AC
  Administered 2014-04-26: 60 mg via ORAL
  Filled 2014-04-26: qty 3

## 2014-04-26 NOTE — Progress Notes (Signed)
[name] is a 64 year old woman with scleroderma and severe secondary pulmonary hypertension. She had been treated with bosentan + Coumadin. Initiated [name], Adcirca in the past but unable to tolerate. Finally started on inhaled Tyvaso in 2010. Some delay in titrating up to goal dosing of 9 puffs 4x a day due to compliance issues, but finally able to do so. This was then stopped in Summer 2012 during admission for septic knee hardware.   By TTE, her PASP improved from 102 mmHg (7/09) to 11mHg (9/10), 6 minute walk improved to 3290m11/2010).   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 That hospitalization ultimately resulted in ICU stay for sepsis, c/b renal insufficiency. TTE on June 29 showed estimated RVSP of 63 mmHg (off Tyvaso). We sent her home on bosentan + coumadin, with plans to restart Tyvaso as outpt once she stabilized. She went home on IV Abx.   Readmitted 09/2010 with fever and leukocytosis, S Cr 1.8 - 2.0. Pulm/CCM consulted to assist w her management of PAH medications.  ROV 10/29/10 -- scleroderma, PAH. Has been hospitalized x3 since our last visit due to infected knee hardware. She is off tyvaso. She is planning to go back for repeat L knee surgery this month. She states that her breathing has been going well. She is on Tracleer. She does not want to restart the Tyvaso at this time. Denies dyspnea, syncope, CP.  She is finished with IV abx.   ROV 02/01/11 -- scleroderma, PAH. Remains on coumadin + bosentan but off Tyvaso as above. Last TTE on June 29 showed estimated RVSP of 63 mmHg (off Tyvaso).  She returns for f/u. Tells me that since last visit she underwent f/u knee sgy to replace hardware. She feels it is doing well, now off abx. She is not interested in going back on Tyvaso. Her breathing  seems to be stable, although she isn't really pushing herself.   ROV 06/21/11 -- scleroderma, PAH. Remains on coumadin + bosentan but off Tyvaso. She clinically feels well, has been able to increase her activity but PAP's have increased by TTE 09/11/10. We have planned for repeat TTE in June 2013. Remains on Pred 30m11md for her scleroderma. She asks today about whether a FXa inhibitor would be possible for her, but I don[facility] know [facility] Due for LFT's, wants to get them at coumadin clinic if possible, she will call to arrange.   ROV 10/04/11 -- scleroderma, PAH. Remains on coumadin + bosentan but off Tyvaso. TTE on 08/18/11 shows PASP rising, now 830m80m(from 63 08/2010; her best was 42 in 11/2008). Clinically, she feels well - no breathing complaints. Her wt has been stable, trace edema without change. She ambulates and does her usual activities, denies dyspnea.   ROV 12/13/11 -- scleroderma, PAH. Remains on coumadin + bosentan but off Tyvaso. TTE on 08/18/11 shows PASP rising, now 830mm630mfrom 63 08/2010; her best was 42 in 11/2008). Returns today to discuss Tyvaso - She is still against doing it due to the fact that it is time consuming, difficult to do 4-6 times a day. She isn't sure that it helps her breathing. Her TTE showed moderate RV dilation 6/13. Denies SOB, edema or CP.   ROV 10/04/12 -- scleroderma, PAH.  Remains on coumadin + bosentan but off Tyvaso. She has not had her TTE. Has not been compliant with INR checks at [facility]. Last LFT in my records 05/2012. She reports that she has been feeling well. No breathing limitations. She is asking to be off of coumadin, off of Tracleer.   ROV 11/10/12 -- scleroderma, PAH. She has been on Tyvaso before, stopped due to difficulty administering. I stopped her Tracleer and coumadin 7/23 because she wouldn't get her labs checked reliably. She returns and states that she misses the Tracleer >> she has felt weaker, stayed in the bed,  more dyspnea, decreased functional capacity.   ROV 01/17/13 -- scleroderma, PAH. She has been on Tyvaso before, stopped due to difficulty administering. I stopped her bosentan due to non-compliance with LFT's. She missed it so I restarted 62.49m bid 8/29. I didn't restart the coumadin. She returns without any LFT since 11/10/12. I discussed macitentan with her today. I have recommended switching since LFT's don't have to be followed.   ROV 02/21/13 -- hx scleroderma, PAH. Has been on and off several PAH meds and also coumadin as detailed above. We started macitentan last time. She is currently taking at night - 153m She was having nausea and diarrhea at first, then this got better when she switched to taking at night. Her breathing is stable, maybe slightly better.  Her PASP on TTE 01/24/13 > stable at 8766m  ROV 05/29/13 -- follows up for scleroderma, PAH. Has been on and off several PAH meds and also coumadin. I stopped macitentan beginning of March due to persistent diarrhea. Since then she has continued to have diarrhea. Her breathing has worsened since we stopped the macitentan and she has more edema. She stopped lisinopril/HCTZ a week ago by PCP due to hyperkalemia. She is due for repeat TTE in June. C diff was negative. She is taking loperamide prn. Still on reglan.    ROV 06/05/13 -- follow up for [age] woman with scleroderma and PAH. Seen last week for diarrhea, abd pain. We stopped her macitentan 05/14/13 concerned that this was a possible side effect, but the sx have persisted. She has also been retaining more fluid, has gained wt. I gave her lasix x 3 days a week ago. Her S Cr has risen to 1.6 from 1.3.  We stopped reglan last visit. She has still had frequent diarrhea, nausea. Her dyspnea has worsened. The loperamide has helped some.   ROV 06/14/13 -- follow up for [age] woman with scleroderma and PAH. She was hospitalized in late March for decompensated cor pulmonale, diuresed. She feels  significantly better, has lost 40+ lbs with diuresis. In the hospital the Tracleer was stopped and she was started on Adcirca. She feels better, less swelling.   ROV 11/08/13 -- [age] woman w scleroderma (no longer on pred per her report) and secondary PAH. She has been on various PAH meds in the past. Most recently she is on Adcirca. She feels "ok", but admits to being limited w her activity. She has gained 14 lbs and is more SOB.  No hospitalizations.  She is taking care of her 4 grandchildren. She is confused about her meds > can't recall what she's on (ie her lasix dose).   ROV 12/07/13 -- follow up for severe secondary PAH due to scleroderma. She is on Adcirca and was recently planned to start on letaris, although she hasn't gotten it yet. She is having chest tightness, especially after she eats.  He dyspnea is stable.    04/19/14 Post Hosp follow up : Severe secondary PAH d/t scleroderma  Patient returns for a post hospital follow-up. She was admitted December 28 through January 11 for decompensated, acute on chronic diastolic congestive heart failure. Repeat echo showed moderate LVH, EF of 76-28%, grade 1 diastolic dysfunction, pulmonary artery pressures of 111. She was placed on milrinone given the feeling that her decompensation was mostly right-sided heart failure. He was diuresed. Right heart catheter showed PA pressures of 68/21 Patient did have some progressive anemia and underwent an endoscopy that showed AVMs. She was continued on PPIs and nonsteroidals were held. He did require blood transfusion Patient was discharged on Adcirca and letaris and restart Tyvaso OP .  She has restarted Tyvaso is currently on 7 puffs  4 times daily.   ROS Constitutional:   No  weight loss, night sweats,  Fevers, chills, + fatigue, or  lassitude.  HEENT:   No headaches,  Difficulty swallowing,  Tooth/dental problems, or  Sore throat,                No sneezing, itching, ear ache, nasal congestion, post  nasal drip,   CV:  No chest pain,  Orthopnea, PND, swelling in lower extremities, anasarca, dizziness, palpitations, syncope.   GI  No heartburn, indigestion, abdominal pain, nausea, vomiting, diarrhea, change in bowel habits, loss of appetite, bloody stools.   Resp:   No chest wall deformity  Skin: no rash or lesions.  GU: no dysuria, change in color of urine, no urgency or frequency.  No flank pain, no hematuria   MS:  No joint pain or swelling.  No decreased range of motion.  No back pain.  Psych:  No change in mood or affect. No depression or anxiety.  No memory loss.        EXAM :   Gen: Pleasant, chronically ill appearing  in no distress,    ENT: No lesions,  mouth clear,  oropharynx clear, no postnasal drip  Neck: No JVD, no TMG, no carotid bruits  Lungs: No use of accessory muscles, no dullness to percussion, clear without rales or rhonchi  Cardiovascular: RRR, heart sounds normal, no murmur or gallops, no peripheral edema  Musculoskeletal: No deformities, NO pretibial edema (much improved).   Neuro: alert, non focal  Skin: Warm, no lesions or rashes   06/08/13 --  Study Conclusions  - Left ventricle: Flattening of the septum. The cavity size was normal. Wall thickness was increased in a pattern of mild LVH. The estimated ejection fraction was 50%. - Left atrium: The atrium was mildly dilated. - Right ventricle: The cavity size was moderately to severely dilated. Systolic function was moderately to severely reduced. - Right atrium: The atrium was severely dilated. - Tricuspid valve: Moderate-severe regurgitation. - Pulmonary arteries: PA peak pressure: 16m Hg (S). - Pericardium, extracardiac: A small pericardial effusion was identified posterior to the heart.    01/24/13 --  TTE Study Conclusions  - Left ventricle: The cavity size was normal. There was mild concentric hypertrophy. Systolic function was normal. The estimated ejection fraction  was in the range of 60% to 65%. Wall motion was normal; there were no regional wall motion abnormalities. Left ventricular diastolic function parameters were normal. - Ventricular septum: The contour showed diastolic flattening and systolic flattening consistent with volume and pressure overload. - Right ventricle: The cavity size was severely dilated. There was moderate hypertrophy. Systolic function was moderately reduced. The estimated peak pressure  was 63m Hg. - Right atrium: There is significant bowing of the interatrial septum to the left consistent with severely elavated right sided pressures. The atrium was massively dilated. - Tricuspid valve: Moderate regurgitation. - Pulmonary arteries: Systolic pressure was severely increased. PA peak pressure: 858mHg (S). (Stable from 1 year ago)

## 2014-04-26 NOTE — ED Provider Notes (Signed)
CSN: [id number]     Arrival date & time 04/26/14  1653 History   First MD Initiated Contact with Patient 04/26/14 1916     Chief Complaint  Patient presents with  . Shortness of Breath      HPI The pt is c/o a cough and sob that started yesterday. No pain anywhere. Dry cough only hx chf. Sob after a coughing episode Past Medical History  Diagnosis Date  . Secondary pulmonary hypertension     right heart cath 04/20/04  . Cough   . Allergic rhinitis, cause unspecified   . Esophageal reflux   . Systemic sclerosis   . Unspecified essential hypertension   . Gastritis   . Sickle cell trait     "trace"  . Obesity   . Visual changes   . Scleroderma   . Diastolic dysfunction   . Trichomonas   . Vaginal bleeding   . Neuropathy   . CHF (congestive heart failure)   . Type II diabetes mellitus     DIET CONTROL   . Bursitis   . History of blood transfusion     "think it was related to when I had partial hysteretomy; might have been for one of my knee ORs"  . Degeneration of lumbar or lumbosacral intervertebral disc   . Arthritis     "all over my body" (08/04/2013)  . Chronic kidney disease     "been in hospital for it; don't know what kind" (08/04/2013)   Past Surgical History  Procedure Laterality Date  . Tubal ligation    . Esophagogastroduodenoscopy  02/23/2010    multiple  . Shoulder arthroscopy Right 12/2009    subacromial decompression  . Replacement total knee Left 11/2000  . Cardiac catheterization Right 04/24/2004  . Tonsillectomy  1960's  . Abdominal hysterectomy  1983    "partial"  . Joint replacement    . Shoulder arthroscopy w/ rotator cuff repair Right 01/2010  . Excisional total knee arthroplasty with antibiotic spacers Left 08/2010    "got infected & had to take 1st replacement out"  . Revision total knee arthroplasty Left 11/2010    "removed spacers; replaced knee"  . Total knee revision with scar debridement/patella revision with poly exchange Left 12/2010     fell; knee split opened; had to redo revision"  . Cardiac catheterization Left 07/2010  . Peripherally inserted central catheter insertion  09/2010  . Colonoscopy  09/06/2008    normal----Dr. Lajoyce Corners   . Right heart catheterization N/A 03/18/2014    Procedure: RIGHT HEART CATH;  Surgeon: Larey Dresser, MD;  Location: [facility] CATH LAB;  Service: Cardiovascular;  Laterality: N/A;  . Esophagogastroduodenoscopy N/A 03/23/2014    Procedure: ESOPHAGOGASTRODUODENOSCOPY (EGD);  Surgeon: Ladene Artist, MD;  Location[facility] ENDOSCOPY;  Service: Endoscopy;  Laterality: N/A;   Family History  Problem Relation Age of Onset  . Heart disease Mother   . Diabetes Mother   . Diabetes Sister    History  Substance Use Topics  . Smoking status: Never Smoker   . Smokeless tobacco: Never Used  . Alcohol Use: No   OB History    Gravida Para Term Preterm AB TAB SAB Ectopic Multiple Living   _0 Review of Systems  All other systems reviewed and are negative  Allergies  Cephalexin; Ciprofloxacin; Codeine; Contrast media; and Iohexol  Home Medications   Prior to Admission medications   Medication Sig Start  Date End Date Taking? Authorizing Provider  ambrisentan (LETAIRIS) 10 MG tablet Take 1 tablet (10 mg total) by mouth daily. 03/19/14  Yes Collene Gobble, MD  esomeprazole (NEXIUM) 40 MG capsule Take 40 mg by mouth 2 (two) times daily.    Yes Historical Provider, MD  fenofibrate 54 MG tablet Take 1 tablet (54 mg total) by mouth daily. 03/25/14  Yes Erick Colace, NP  ferrous sulfate ([facility] IRON) 325 (65 FE) MG tablet Take 325 mg by mouth daily with breakfast.   Yes Historical Provider, MD  FLUoxetine (PROZAC) 20 MG capsule take 1 capsule by mouth every morning 01/14/14  Yes Sid Falcon, MD  furosemide (LASIX) 40 MG tablet Take 1 tablet (40 mg total) by mouth 2 (two) times daily. 01/17/14  Yes Rande Brunt, NP  gabapentin (NEURONTIN) 100 MG capsule Take 1 capsule (100 mg total) by mouth 2 (two)  times daily. 08/17/13  Yes Dennie Bible, NP  HYDROcodone-acetaminophen [facility]) 10-325 MG per tablet Take 1 tablet by mouth every 6 (six) hours as needed for moderate pain.  07/17/12  Yes Historical Provider, MD  pregabalin (LYRICA) 150 MG capsule Take 1 capsule (150 mg total) by mouth 3 (three) times daily. 04/01/14  Yes Marcial Pacas, MD  promethazine (PHENERGAN) 12.5 MG tablet Take 1 tablet (12.5 mg total) by mouth every 6 (six) hours as needed for nausea or vomiting. 02/27/14  Yes Sid Falcon, MD  Tadalafil, PAH, (ADCIRCA) 20 MG TABS Take 40 mg by mouth daily.   Yes Historical Provider, MD  Treprostinil (TYVASO) 0.6 MG/ML SOLN Inhale 18 mcg into the lungs 4 (four) times daily.    Yes Historical Provider, MD   BP 92/54 mmHg  Pulse 72  Temp(Src) 98.3 F (36.8 C) (Oral)  Resp 16  Ht _0  (1.626 m)  Wt 154 lb (69.854 kg)  BMI 26.42 kg/m2  SpO2 94% Physical Exam  Constitutional: She is oriented to person, place, and time. She appears well-developed and well-nourished. No distress.  HENT:  Head: Normocephalic and atraumatic.  Eyes: Pupils are equal, round, and reactive to light.  Neck: Normal range of motion.  Cardiovascular: Normal rate and intact distal pulses.   Pulmonary/Chest: No respiratory distress. She has wheezes (Scattered expiratory wheezes).  Abdominal: Normal appearance. She exhibits no distension. There is no tenderness. There is no rebound and no guarding.  Musculoskeletal: Normal range of motion.  Neurological: She is alert and oriented to person, place, and time. No cranial nerve deficit.  Skin: Skin is warm and dry. No rash noted.  Psychiatric: She has a normal mood and affect. Her behavior is normal.  Nursing note and vitals reviewed.   ED Course  Procedures (including critical care time) Labs Review Labs Reviewed  CBC - Abnormal; Notable for the following:    RBC 3.51 (*)    Hemoglobin 9.2 (*)    HCT 28.6 (*)    RDW 16.0 (*)    All other components within  normal limits  BASIC METABOLIC PANEL - Abnormal; Notable for the following:    BUN 41 (*)    Creatinine, Ser 1.27 (*)    GFR calc non Af Amer 44 (*)    GFR calc Af Amer 51 (*)    All other components within normal limits  BRAIN NATRIURETIC PEPTIDE - Abnormal; Notable for the following:    B Natriuretic Peptide 120.9 (*)    All other components within normal limits  TROPONIN I  OCCULT BLOOD  X 1 CARD TO LAB, STOOL  POC OCCULT BLOOD, ED    Imaging Review Dg Chest 2 View  04/26/2014   CLINICAL DATA:  Dry cough, shortness of breath. Midsternal chest pain when coughing today. History of CHF, hypertension, diabetes, cardiac surgery.  EXAM: CHEST  2 VIEW  COMPARISON:  04/19/2014  FINDINGS: The heart is markedly enlarged but stable. There is pulmonary vascular congestion. There is persistent density at the right lung base consistent with infectious infiltrate.  IMPRESSION: 1. Cardiomegaly and mild pulmonary vascular congestion. 2. Persistent right lower lobe infiltrate.   Electronically Signed   By: Nolon Nations M.D.   On: 04/26/2014 19:14     EKG Interpretation   Date/Time:  Friday April 26 2014 17:15:39 EST Ventricular Rate:  73 PR Interval:  194 QRS Duration: 80 QT Interval:  434 QTC Calculation: 478 R Axis:   132 Text Interpretation:  Sinus rhythm with Fusion complexes Right axis  deviation Possible Right ventricular hypertrophy Nonspecific ST and T wave  abnormality Prolonged QT Abnormal ECG No significant change since last  tracing Confirmed by Latrish Mogel  MD, Taylen Wendland ([id number]) on 04/26/2014 7:18:16 PM      I discussed her case with pulmonary doctor.  [name] feeling better.  Plan to  increase her Lasix and if she has to return should plan on being admitted.    After treatment in the ED the patient feels back to baseline and wants to go home. MDM   Final diagnoses:  Pulmonary hypertension  Chronic diastolic heart failure        Dot Lanes, MD 04/26/14 2200

## 2014-04-26 NOTE — ED Notes (Signed)
The pt is c/o a cough and sob that started yesterday.  No pain anywhere.  Dry cough only hx chf.  Sob after a coughing episode

## 2014-04-26 NOTE — Discharge Instructions (Signed)
Increase your Lasix to 40 mg 3 times a day tomorrow and Sunday.  Heart Failure Heart failure is a condition in which the heart has trouble pumping blood. This means your heart does not pump blood efficiently for your body to work well. In some cases of heart failure, fluid may back up into your lungs or you may have swelling (edema) in your lower legs. Heart failure is usually a long-term (chronic) condition. It is important for you to take good care of yourself and follow your health care provider's treatment plan. CAUSES  Some health conditions can cause heart failure. Those health conditions include:  High blood pressure (hypertension). Hypertension causes the heart muscle to work harder than normal. When pressure in the blood vessels is high, the heart needs to pump (contract) with more force in order to circulate blood throughout the body. High blood pressure eventually causes the heart to become stiff and weak.  Coronary artery disease (CAD). CAD is the buildup of cholesterol and fat (plaque) in the arteries of the heart. The blockage in the arteries deprives the heart muscle of oxygen and blood. This can cause chest pain and may lead to a heart attack. High blood pressure can also contribute to CAD.  Heart attack (myocardial infarction). A heart attack occurs when one or more arteries in the heart become blocked. The loss of oxygen damages the muscle tissue of the heart. When this happens, part of the heart muscle dies. The injured tissue does not contract as well and weakens the heart's ability to pump blood.  Abnormal heart valves. When the heart valves do not open and close properly, it can cause heart failure. This makes the heart muscle pump harder to keep the blood flowing.  Heart muscle disease (cardiomyopathy or myocarditis). Heart muscle disease is damage to the heart muscle from a variety of causes. These can include drug or alcohol abuse, infections, or unknown reasons. These can  increase the risk of heart failure.  Lung disease. Lung disease makes the heart work harder because the lungs do not work properly. This can cause a strain on the heart, leading it to fail.  Diabetes. Diabetes increases the risk of heart failure. High blood sugar contributes to high fat (lipid) levels in the blood. Diabetes can also cause slow damage to tiny blood vessels that carry important nutrients to the heart muscle. When the heart does not get enough oxygen and food, it can cause the heart to become weak and stiff. This leads to a heart that does not contract efficiently.  Other conditions can contribute to heart failure. These include abnormal heart rhythms, thyroid problems, and low blood counts (anemia). Certain unhealthy behaviors can increase the risk of heart failure, including:  Being overweight.  Smoking or chewing tobacco.  Eating foods high in fat and cholesterol.  Abusing illicit drugs or alcohol.  Lacking physical activity. SYMPTOMS  Heart failure symptoms may vary and can be hard to detect. Symptoms may include:  Shortness of breath with activity, such as climbing stairs.  Persistent cough.  Swelling of the feet, ankles, legs, or abdomen.  Unexplained weight gain.  Difficulty breathing when lying flat (orthopnea).  Waking from sleep because of the need to sit up and get more air.  Rapid heartbeat.  Fatigue and loss of energy.  Feeling light-headed, dizzy, or close to fainting.  Loss of appetite.  Nausea.  Increased urination during the night (nocturia). DIAGNOSIS  A diagnosis of heart failure is based on your  history, symptoms, physical examination, and diagnostic tests. Diagnostic tests for heart failure may include:  Echocardiography.  Electrocardiography.  Chest X-ray.  Blood tests.  Exercise stress test.  Cardiac angiography.  Radionuclide scans. TREATMENT  Treatment is aimed at managing the symptoms of heart failure. Medicines,  behavioral changes, or surgical intervention may be necessary to treat heart failure.  Medicines to help treat heart failure may include:  Angiotensin-converting enzyme (ACE) inhibitors. This type of medicine blocks the effects of a blood protein called angiotensin-converting enzyme. ACE inhibitors relax (dilate) the blood vessels and help lower blood pressure.  Angiotensin receptor blockers (ARBs). This type of medicine blocks the actions of a blood protein called angiotensin. Angiotensin receptor blockers dilate the blood vessels and help lower blood pressure.  Water pills (diuretics). Diuretics cause the kidneys to remove salt and water from the blood. The extra fluid is removed through urination. This loss of extra fluid lowers the volume of blood the heart pumps.  Beta blockers. These prevent the heart from beating too fast and improve heart muscle strength.  Digitalis. This increases the force of the heartbeat.  Healthy behavior changes include:  Obtaining and maintaining a healthy weight.  Stopping smoking or chewing tobacco.  Eating heart-healthy foods.  Limiting or avoiding alcohol.  Stopping illicit drug use.  Physical activity as directed by your health care provider.  Surgical treatment for heart failure may include:  A procedure to open blocked arteries, repair damaged heart valves, or remove damaged heart muscle tissue.  A pacemaker to improve heart muscle function and control certain abnormal heart rhythms.  An internal cardioverter defibrillator to treat certain serious abnormal heart rhythms.  A left ventricular assist device (LVAD) to assist the pumping ability of the heart. HOME CARE INSTRUCTIONS   Take medicines only as directed by your health care provider. Medicines are important in reducing the workload of your heart, slowing the progression of heart failure, and improving your symptoms.  Do not stop taking your medicine unless directed by your health  care provider.  Do not skip any dose of medicine.  Refill your prescriptions before you run out of medicine. Your medicines are needed every day.  Engage in moderate physical activity if directed by your health care provider. Moderate physical activity can benefit some people. The elderly and people with severe heart failure should consult with a health care provider for physical activity recommendations.  Eat heart-healthy foods. Food choices should be free of trans fat and low in saturated fat, cholesterol, and salt (sodium). Healthy choices include fresh or frozen fruits and vegetables, fish, lean meats, legumes, fat-free or low-fat dairy products, and whole grain or high fiber foods. Talk to a dietitian to learn more about heart-healthy foods.  Limit sodium if directed by your health care provider. Sodium restriction may reduce symptoms of heart failure in some people. Talk to a dietitian to learn more about heart-healthy seasonings.  Use healthy cooking methods. Healthy cooking methods include roasting, grilling, broiling, baking, poaching, steaming, or stir-frying. Talk to a dietitian to learn more about healthy cooking methods.  Limit fluids if directed by your health care provider. Fluid restriction may reduce symptoms of heart failure in some people.  Weigh yourself every day. Daily weights are important in the early recognition of excess fluid. You should weigh yourself every morning after you urinate and before you eat breakfast. Wear the same amount of clothing each time you weigh yourself. Record your daily weight. Provide your health care provider with your  weight record.  Monitor and record your blood pressure if directed by your health care provider.  Check your pulse if directed by your health care provider.  Lose weight if directed by your health care provider. Weight loss may reduce symptoms of heart failure in some people.  Stop smoking or chewing tobacco. Nicotine makes  your heart work harder by causing your blood vessels to constrict. Do not use nicotine gum or patches before talking to your health care provider.  Keep all follow-up visits as directed by your health care provider. This is important.  Limit alcohol intake to no more than 1 drink per day for nonpregnant women and 2 drinks per day for men. One drink equals 12 ounces of beer, 5 ounces of wine, or 1 ounces of hard liquor. Drinking more than that is harmful to your heart. Tell your health care provider if you drink alcohol several times a week. Talk with your health care provider about whether alcohol is safe for you. If your heart has already been damaged by alcohol or you have severe heart failure, drinking alcohol should be stopped completely.  Stop illicit drug use.  Stay up-to-date with immunizations. It is especially important to prevent respiratory infections through current pneumococcal and influenza immunizations.  Manage other health conditions such as hypertension, diabetes, thyroid disease, or abnormal heart rhythms as directed by your health care provider.  Learn to manage stress.  Plan rest periods when fatigued.  Learn strategies to manage high temperatures. If the weather is extremely hot:  Avoid vigorous physical activity.  Use air conditioning or fans or seek a cooler location.  Avoid caffeine and alcohol.  Wear loose-fitting, lightweight, and light-colored clothing.  Learn strategies to manage cold temperatures. If the weather is extremely cold:  Avoid vigorous physical activity.  Layer clothes.  Wear mittens or gloves, a hat, and a scarf when going outside.  Avoid alcohol.  Obtain ongoing education and support as needed.  Participate in or seek rehabilitation as needed to maintain or improve independence and quality of life. SEEK MEDICAL CARE IF:   Your weight increases by 03 lb/1.4 kg in 1 day or 05 lb/2.3 kg in a week.  You have increasing shortness of  breath that is unusual for you.  You are unable to participate in your usual physical activities.  You tire easily.  You cough more than normal, especially with physical activity.  You have any or more swelling in areas such as your hands, feet, ankles, or abdomen.  You are unable to sleep because it is hard to breathe.  You feel like your heart is beating fast (palpitations).  You become dizzy or light-headed upon standing up. SEEK IMMEDIATE MEDICAL CARE IF:   You have difficulty breathing.  There is a change in mental status such as decreased alertness or difficulty with concentration.  You have a pain or discomfort in your chest.  You have an episode of fainting (syncope). MAKE SURE YOU:   Understand these instructions.  Will watch your condition.  Will get help right away if you are not doing well or get worse. Document Released: 03/01/2005 Document Revised: 07/16/2013 Document Reviewed: 03/31/2012 [facility] Patient Information 2015 [place], [place]. This information is not intended to replace advice given to you by your health care provider. Make sure you discuss any questions you have with your health care provider.

## 2014-04-26 NOTE — Assessment & Plan Note (Signed)
Remain on Tyvaso 7 puffs Four times a day  For 1 week then 8 puffs Four times a day  For 1 week then 9 puffs Four times a day  -hold at this dose.  Continue on [place] .  Follow up Dr. Lamonte Sakai  In 4 weeks and As needed   Labs and chest xray today  Please contact office for sooner follow up if symptoms do not improve or worsen or seek emergency care

## 2014-05-01 ENCOUNTER — Other Ambulatory Visit: Payer: Self-pay | Admitting: Internal Medicine

## 2014-05-01 ENCOUNTER — Telehealth (HOSPITAL_COMMUNITY): Payer: Self-pay | Admitting: Cardiology

## 2014-05-01 DIAGNOSIS — M359 Systemic involvement of connective tissue, unspecified: Secondary | ICD-10-CM | POA: Diagnosis not present

## 2014-05-01 DIAGNOSIS — I272 Other secondary pulmonary hypertension: Secondary | ICD-10-CM | POA: Diagnosis not present

## 2014-05-01 NOTE — Telephone Encounter (Signed)
Pt called with c/o chest pains that radiate into back Increased SOB weight up to 153 lb Pt feels like "fluid is building up on chest"  Pt confirms active chest pains and SOB Advised pt to report to nearest ER for further evaluation, pt reports she does not have transportation and unsure how long it will take for someone to get to her Then advised pt to hang up and call 911, pt does not want to call 911 for further evaluation Pt only requests an appointment for 05/02/14 Pt reports she was seen in the ER on 04/26/14 and feels like the symptoms are the same not increased or worsened    Appointment given to pt for 05/02/14 @ 9 am again advised pt if symptoms get worse overnight she should report to ER for further evaluation

## 2014-05-02 ENCOUNTER — Encounter (HOSPITAL_COMMUNITY): Payer: Self-pay

## 2014-05-02 ENCOUNTER — Inpatient Hospital Stay (HOSPITAL_COMMUNITY)
Admission: AD | Admit: 2014-05-02 | Discharge: 2014-05-09 | DRG: 292 | Disposition: A | Discharge status: Home / Self Care | Payer: Medicare Other | Source: Ambulatory Visit | Attending: Cardiology | Admitting: Cardiology

## 2014-05-02 ENCOUNTER — Other Ambulatory Visit (HOSPITAL_COMMUNITY): Payer: Self-pay | Admitting: Adult Health

## 2014-05-02 ENCOUNTER — Ambulatory Visit (HOSPITAL_BASED_OUTPATIENT_CLINIC_OR_DEPARTMENT_OTHER)
Admission: RE | Admit: 2014-05-02 | Discharge: 2014-05-02 | Disposition: A | Discharge status: Home / Self Care | Payer: Medicare Other | Source: Ambulatory Visit | Attending: Internal Medicine | Admitting: Internal Medicine

## 2014-05-02 VITALS — BP 71/0 | HR 76 | Resp 22 | Wt 159.5 lb

## 2014-05-02 DIAGNOSIS — D509 Iron deficiency anemia, unspecified: Secondary | ICD-10-CM | POA: Diagnosis present

## 2014-05-02 DIAGNOSIS — I509 Heart failure, unspecified: Secondary | ICD-10-CM | POA: Diagnosis not present

## 2014-05-02 DIAGNOSIS — Z6824 Body mass index (BMI) 24.0-24.9, adult: Secondary | ICD-10-CM

## 2014-05-02 DIAGNOSIS — R0989 Other specified symptoms and signs involving the circulatory and respiratory systems: Secondary | ICD-10-CM | POA: Diagnosis not present

## 2014-05-02 DIAGNOSIS — Z9119 Patient's noncompliance with other medical treatment and regimen: Secondary | ICD-10-CM | POA: Diagnosis present

## 2014-05-02 DIAGNOSIS — I272 Other secondary pulmonary hypertension: Secondary | ICD-10-CM | POA: Diagnosis not present

## 2014-05-02 DIAGNOSIS — E119 Type 2 diabetes mellitus without complications: Secondary | ICD-10-CM | POA: Diagnosis not present

## 2014-05-02 DIAGNOSIS — I314 Cardiac tamponade: Secondary | ICD-10-CM | POA: Diagnosis not present

## 2014-05-02 DIAGNOSIS — I9589 Other hypotension: Secondary | ICD-10-CM | POA: Diagnosis not present

## 2014-05-02 DIAGNOSIS — Q2733 Arteriovenous malformation of digestive system vessel: Secondary | ICD-10-CM | POA: Diagnosis not present

## 2014-05-02 DIAGNOSIS — R339 Retention of urine, unspecified: Secondary | ICD-10-CM | POA: Diagnosis not present

## 2014-05-02 DIAGNOSIS — K219 Gastro-esophageal reflux disease without esophagitis: Secondary | ICD-10-CM | POA: Diagnosis not present

## 2014-05-02 DIAGNOSIS — I313 Pericardial effusion (noninflammatory): Secondary | ICD-10-CM | POA: Diagnosis present

## 2014-05-02 DIAGNOSIS — I5032 Chronic diastolic (congestive) heart failure: Secondary | ICD-10-CM

## 2014-05-02 DIAGNOSIS — N183 Chronic kidney disease, stage 3 (moderate): Secondary | ICD-10-CM | POA: Diagnosis present

## 2014-05-02 DIAGNOSIS — Z8249 Family history of ischemic heart disease and other diseases of the circulatory system: Secondary | ICD-10-CM | POA: Diagnosis not present

## 2014-05-02 DIAGNOSIS — I319 Disease of pericardium, unspecified: Secondary | ICD-10-CM | POA: Diagnosis not present

## 2014-05-02 DIAGNOSIS — I270 Primary pulmonary hypertension: Secondary | ICD-10-CM | POA: Diagnosis not present

## 2014-05-02 DIAGNOSIS — M349 Systemic sclerosis, unspecified: Secondary | ICD-10-CM | POA: Diagnosis present

## 2014-05-02 DIAGNOSIS — I5031 Acute diastolic (congestive) heart failure: Secondary | ICD-10-CM

## 2014-05-02 DIAGNOSIS — I4891 Unspecified atrial fibrillation: Secondary | ICD-10-CM | POA: Diagnosis not present

## 2014-05-02 DIAGNOSIS — I5033 Acute on chronic diastolic (congestive) heart failure: Principal | ICD-10-CM | POA: Diagnosis present

## 2014-05-02 DIAGNOSIS — I129 Hypertensive chronic kidney disease with stage 1 through stage 4 chronic kidney disease, or unspecified chronic kidney disease: Secondary | ICD-10-CM | POA: Diagnosis not present

## 2014-05-02 DIAGNOSIS — E669 Obesity, unspecified: Secondary | ICD-10-CM | POA: Diagnosis not present

## 2014-05-02 DIAGNOSIS — I959 Hypotension, unspecified: Secondary | ICD-10-CM | POA: Diagnosis not present

## 2014-05-02 DIAGNOSIS — J9811 Atelectasis: Secondary | ICD-10-CM | POA: Diagnosis not present

## 2014-05-02 DIAGNOSIS — R0600 Dyspnea, unspecified: Secondary | ICD-10-CM

## 2014-05-02 DIAGNOSIS — I2781 Cor pulmonale (chronic): Secondary | ICD-10-CM | POA: Diagnosis not present

## 2014-05-02 DIAGNOSIS — I50810 Right heart failure, unspecified: Secondary | ICD-10-CM

## 2014-05-02 DIAGNOSIS — I2609 Other pulmonary embolism with acute cor pulmonale: Secondary | ICD-10-CM | POA: Diagnosis not present

## 2014-05-02 DIAGNOSIS — D573 Sickle-cell trait: Secondary | ICD-10-CM | POA: Diagnosis not present

## 2014-05-02 DIAGNOSIS — I309 Acute pericarditis, unspecified: Secondary | ICD-10-CM | POA: Diagnosis not present

## 2014-05-02 LAB — COMPREHENSIVE METABOLIC PANEL
ALT: 12 U/L (ref 0–35)
AST: 45 U/L — AB (ref 0–37)
Albumin: 3.3 g/dL — ABNORMAL LOW (ref 3.5–5.2)
Alkaline Phosphatase: 141 U/L — ABNORMAL HIGH (ref 39–117)
Anion gap: 10 (ref 5–15)
BUN: 51 mg/dL — ABNORMAL HIGH (ref 6–23)
CHLORIDE: 104 mmol/L (ref 96–112)
CO2: 22 mmol/L (ref 19–32)
Calcium: 9.1 mg/dL (ref 8.4–10.5)
Creatinine, Ser: 2 mg/dL — ABNORMAL HIGH (ref 0.50–1.10)
GFR calc Af Amer: 30 mL/min — ABNORMAL LOW (ref >90)
GFR calc non Af Amer: 26 mL/min — ABNORMAL LOW (ref >90)
Glucose, Bld: 116 mg/dL — ABNORMAL HIGH (ref 70–99)
Potassium: 4.8 mmol/L (ref 3.5–5.1)
Sodium: 136 mmol/L (ref 135–145)
Total Bilirubin: 1 mg/dL (ref 0.3–1.2)
Total Protein: 7.5 g/dL (ref 6.0–8.3)

## 2014-05-02 LAB — CBC
HEMATOCRIT: 26.1 % — AB (ref 36.0–46.0)
Hemoglobin: 8.7 g/dL — ABNORMAL LOW (ref 12.0–15.0)
MCH: 26.4 pg (ref 26.0–34.0)
MCHC: 33.3 g/dL (ref 30.0–36.0)
MCV: 79.1 fL (ref 78.0–100.0)
Platelets: 195 10*3/uL (ref 150–400)
RBC: 3.3 MIL/uL — ABNORMAL LOW (ref 3.87–5.11)
RDW: 15.5 % (ref 11.5–15.5)
WBC: 7 10*3/uL (ref 4.0–10.5)

## 2014-05-02 LAB — BRAIN NATRIURETIC PEPTIDE: B Natriuretic Peptide: 670.3 pg/mL — ABNORMAL HIGH (ref 0.0–100.0)

## 2014-05-02 LAB — MRSA PCR SCREENING: MRSA by PCR: NEGATIVE

## 2014-05-02 MED ORDER — FUROSEMIDE 10 MG/ML IJ SOLN: 80.0000 mg | Freq: Two times a day (BID) | INTRAMUSCULAR | Status: DC

## 2014-05-02 MED ORDER — MILRINONE IN DEXTROSE 20 MG/100ML IV SOLN
0.1250 ug/kg/min | INTRAVENOUS | Status: DC
  Administered 2014-05-02: 0.125 ug/kg/min via INTRAVENOUS
  Administered 2014-05-03 – 2014-05-04 (×3): 0.25 ug/kg/min via INTRAVENOUS
  Administered 2014-05-06: 0.125 ug/kg/min via INTRAVENOUS
  Filled 2014-05-02 (×6): qty 100

## 2014-05-02 MED ORDER — SODIUM CHLORIDE 0.9 % IJ SOLN
3.0000 mL | Freq: Two times a day (BID) | INTRAMUSCULAR | Status: DC
  Administered 2014-05-02: 3 mL via INTRAVENOUS

## 2014-05-02 MED ORDER — ACETAMINOPHEN 325 MG PO TABS
650.0000 mg | ORAL_TABLET | ORAL | Status: DC | PRN
  Administered 2014-05-03 – 2014-05-05 (×2): 650 mg via ORAL
  Filled 2014-05-02 (×2): qty 2

## 2014-05-02 MED ORDER — SODIUM CHLORIDE 0.9 % IJ SOLN: 3.0000 mL | INTRAMUSCULAR | Status: DC | PRN

## 2014-05-02 MED ORDER — TREPROSTINIL 0.6 MG/ML IN SOLN
18.0000 ug | Freq: Four times a day (QID) | RESPIRATORY_TRACT | Status: DC
  Administered 2014-05-03 – 2014-05-09 (×24): 18 ug via RESPIRATORY_TRACT

## 2014-05-02 MED ORDER — FUROSEMIDE 10 MG/ML IJ SOLN
80.0000 mg | Freq: Two times a day (BID) | INTRAMUSCULAR | Status: DC
  Administered 2014-05-02 – 2014-05-03 (×3): 80 mg via INTRAVENOUS
  Filled 2014-05-02 (×5): qty 8

## 2014-05-02 MED ORDER — PNEUMOCOCCAL VAC POLYVALENT 25 MCG/0.5ML IJ INJ
0.5000 mL | INJECTION | INTRAMUSCULAR | Status: AC
  Administered 2014-05-03: 0.5 mL via INTRAMUSCULAR
  Filled 2014-05-02: qty 0.5

## 2014-05-02 MED ORDER — NOREPINEPHRINE BITARTRATE 1 MG/ML IV SOLN
0.0000 ug/min | INTRAVENOUS | Status: DC
  Administered 2014-05-02: 2 ug/min via INTRAVENOUS
  Filled 2014-05-02 (×2): qty 4

## 2014-05-02 MED ORDER — SODIUM CHLORIDE 0.9 % IJ SOLN
10.0000 mL | Freq: Two times a day (BID) | INTRAMUSCULAR | Status: DC
  Administered 2014-05-02: 30 mL
  Administered 2014-05-02 – 2014-05-08 (×12): 10 mL
  Administered 2014-05-08 – 2014-05-09 (×2): 20 mL

## 2014-05-02 MED ORDER — AMBRISENTAN 5 MG PO TABS: 10.0000 mg | ORAL_TABLET | Freq: Every day | ORAL | Status: DC

## 2014-05-02 MED ORDER — GUAIFENESIN-DM 100-10 MG/5ML PO SYRP
5.0000 mL | ORAL_SOLUTION | ORAL | Status: DC | PRN
  Administered 2014-05-02: 5 mL via ORAL
  Filled 2014-05-02: qty 5

## 2014-05-02 MED ORDER — SODIUM CHLORIDE 0.9 % IV SOLN
250.0000 mL | INTRAVENOUS | Status: DC | PRN
  Administered 2014-05-02 – 2014-05-03 (×2): 250 mL via INTRAVENOUS

## 2014-05-02 MED ORDER — FLUOXETINE HCL 20 MG PO CAPS
20.0000 mg | ORAL_CAPSULE | Freq: Every day | ORAL | Status: DC
  Administered 2014-05-02 – 2014-05-09 (×8): 20 mg via ORAL
  Filled 2014-05-02 (×8): qty 1

## 2014-05-02 MED ORDER — ENOXAPARIN SODIUM 30 MG/0.3ML ~~LOC~~ SOLN
30.0000 mg | SUBCUTANEOUS | Status: DC
  Administered 2014-05-02 – 2014-05-07 (×5): 30 mg via SUBCUTANEOUS
  Filled 2014-05-02 (×8): qty 0.3

## 2014-05-02 MED ORDER — GABAPENTIN 100 MG PO CAPS
100.0000 mg | ORAL_CAPSULE | Freq: Two times a day (BID) | ORAL | Status: DC
  Administered 2014-05-02 – 2014-05-09 (×15): 100 mg via ORAL
  Filled 2014-05-02 (×16): qty 1

## 2014-05-02 MED ORDER — ONDANSETRON HCL 4 MG/2ML IJ SOLN
4.0000 mg | Freq: Four times a day (QID) | INTRAMUSCULAR | Status: DC | PRN
  Administered 2014-05-05: 4 mg via INTRAVENOUS
  Filled 2014-05-02 (×2): qty 2

## 2014-05-02 MED ORDER — SODIUM CHLORIDE 0.9 % IJ SOLN
10.0000 mL | INTRAMUSCULAR | Status: DC | PRN
  Administered 2014-05-05: 20 mL
  Filled 2014-05-02: qty 40

## 2014-05-02 MED ORDER — HYDROCODONE-ACETAMINOPHEN 10-325 MG PO TABS
1.0000 | ORAL_TABLET | Freq: Four times a day (QID) | ORAL | Status: DC | PRN
  Administered 2014-05-02 – 2014-05-08 (×13): 1 via ORAL
  Filled 2014-05-02 (×13): qty 1

## 2014-05-02 MED ORDER — AMBRISENTAN 5 MG PO TABS
5.0000 mg | ORAL_TABLET | Freq: Once | ORAL | Status: AC
  Administered 2014-05-02: 5 mg via ORAL

## 2014-05-02 MED ORDER — CETYLPYRIDINIUM CHLORIDE 0.05 % MT LIQD
7.0000 mL | Freq: Two times a day (BID) | OROMUCOSAL | Status: DC
  Administered 2014-05-02 – 2014-05-03 (×2): 7 mL via OROMUCOSAL

## 2014-05-02 MED ORDER — PREGABALIN 50 MG PO CAPS
150.0000 mg | ORAL_CAPSULE | Freq: Three times a day (TID) | ORAL | Status: DC
  Administered 2014-05-02 – 2014-05-09 (×21): 150 mg via ORAL
  Filled 2014-05-02 (×42): qty 1

## 2014-05-02 MED ORDER — AMBRISENTAN 5 MG PO TABS
10.0000 mg | ORAL_TABLET | Freq: Every day | ORAL | Status: DC
  Administered 2014-05-03 – 2014-05-09 (×7): 10 mg via ORAL
  Filled 2014-05-02 (×3): qty 2

## 2014-05-02 NOTE — Progress Notes (Signed)
Peripherally Inserted Central Catheter/Midline Placement  The IV Nurse has discussed with the patient and/or persons authorized to consent for the patient, the purpose of this procedure and the potential benefits and risks involved with this procedure.  The benefits include less needle sticks, lab draws from the catheter and patient may be discharged home with the catheter.  Risks include, but not limited to, infection, bleeding, blood clot (thrombus formation), and puncture of an artery; nerve damage and irregular heat beat.  Alternatives to this procedure were also discussed.  PICC/Midline Placement Documentation        [name] 05/02/2014, 12:23 PM

## 2014-05-02 NOTE — Procedures (Signed)
Arterial Line Insertion Procedure Note [name] [id number] December 24, 1950  Procedure: Insertion of R radial arterial catheter Indications: BP monitoring  Procedure Details Consent: Risks of procedure as well as the alternatives and risks of each were explained to the (patient/caregiver).  Consent for procedure obtained. Time Out: Verified patient identification, verified procedure, site/side was marked, verified correct patient position, special equipment/implants available, medications/allergies/relevent history reviewed, required imaging and test results available.  Performed  Maximum sterile technique was used including antiseptics, cap, gloves, gown, hand hygiene, mask and sheet. Skin prep: Chlorhexidine; local anesthetic administered The right radial artery was cannulated and an arterial sheath was placed using a modified Seldinger technique.  Evaluation Blood flow good Complications: No apparent complications Patient did tolerate procedure well.   Glori Bickers MD 05/02/2014, 4:39 PM

## 2014-05-02 NOTE — Progress Notes (Signed)
Cross Cover note:  Patient noting back pain.  Takes Norco for this at home.  This is listed in her H and P as a home medication.  I have reordered it.  Terressa Koyanagi, MD Cardiology Fellow

## 2014-05-02 NOTE — Progress Notes (Addendum)
Patient ID: [name], female   DOB: [birth year], 64 y.o.   MRN: [id number] Primary Cardiologist: Dr Haroldine Laws Pulmonary: Dr Lamonte Sakai   HPI: [name] is a 64 yo female with a history of DM2, HLD, HTN, pHTN, GERD, gastroparesis, scleroderma, DJD and diastolic HF.   Of note she has been on several PAH targeted therapies and coumadin over last 9 years, but has not been reliably on therapy due to noncompliance and inability to have labs performed. She has had best response to Tyvaso (+ bosentan) with an improvement in 6 minute walk and in PASP from 102 mmHg (7/09) to 42 mmHg (9/10). After stopping Tyvaso her PASP rose to 80's by TTE 6/13 and 11/14. Surprisingly, she has never required supplemental O2. Macitentan was started in 11/14. She developed nausea and diarrhea and macitentan was stopped early 3/15. Most recently, she had been on Letairis and Adcirca (followed by Dr. Lamonte Sakai).   Admitted 10/14-10/18/15 for A/C RHF, volume overload and syncope. Diuresed with IV lasix and milrinone a total of 26 lbs. Discharge weight was 156 lbs and started on lasix 40 mg BID.   Admitted 12/29 with dyspnea and chest pain. BP was low, and Lasix and Adcirca were both initially held. Echo showed EF 60-65% with moderately dilated and dysfunctional RV, PASP 111 mmHg with moderate TR. There was a large pericardial effusion without definite tamponade. Presentation was suspected to be due to progressive PAH with secondary RV failure rather than tamponade. No pericardiocentesis yet. She was started on milrinone gtt and diuresed. Shorter-acting sildenafil was started instead of Adcirca. Also had GI bleed. 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 Discharge weight was 146 pounds.   Last echo (1/16) with EF 55-60%, severely dilated RV with moderately decreased systolic function, large pericardial effusion.   She is doing poorly today.  Taking all her medications.  Using Lasix 80 mg  bid currently.  She says that she is not urinating well at all and weight is up 19 lbs since last visit.  She is short of breath with any exertion.  She is nauseated.  BP is low, SBP 72 by doppler today.  She is lightheaded with standing but not currently.  No falls or syncope.  Some nausea.  RHC (1/16):  Hemodynamics (mmHg) RA mean 12 RV 64/14 PA 68/29, mean 42 PCWP mean 12 LV 102/14 AO 105/53 PA 67% AO 93% Cardiac Output (Fick) 6.63  Cardiac Index (Fick) 3.88  PVR 4.5 WU  Labs 03/24/2014 K 3.8 Creatinine 1.51 Hgb 9.3  Labs 2/16 BNP 121, K 4.4, creatinine 1.27, hgb 9.2  ROS: All systems negative except as listed in HPI, PMH and Problem List.  SH:  History   Social History  . Marital Status: Legally Separated    Spouse Name: [name]  . Number of Children: 2  . Years of Education: 11   Occupational History  . Umemployed   . Disability     Social History Main Topics  . Smoking status: Never Smoker   . Smokeless tobacco: Never Used  . Alcohol Use: No  . Drug Use: No  . Sexual Activity: Not on file   Other Topics Concern  . Not on file   Social History Narrative    FAMILY HISTORY:  Significant for coronary artery disease and diabetes   Patient lives at home with granddaughter.    Patient has 2 children.    Patient has 11 years of education.  Patient is on disability.    Patient is right handed.    Patient is separated.      FH:  Family History  Problem Relation Age of Onset  . Heart disease Mother   . Diabetes Mother   . Diabetes Sister     Past Medical History  Diagnosis Date  . Secondary pulmonary hypertension     right heart cath 04/20/04  . Cough   . Allergic rhinitis, cause unspecified   . Esophageal reflux   . Systemic sclerosis   . Unspecified essential hypertension   . Gastritis   . Sickle cell trait     "trace"  . Obesity   . Visual changes   . Scleroderma   . Diastolic dysfunction   . Trichomonas   . Vaginal bleeding   . Neuropathy    . CHF (congestive heart failure)   . Type II diabetes mellitus     DIET CONTROL   . Bursitis   . History of blood transfusion     "think it was related to when I had partial hysteretomy; might have been for one of my knee ORs"  . Degeneration of lumbar or lumbosacral intervertebral disc   . Arthritis     "all over my body" (08/04/2013)  . Chronic kidney disease     "been in hospital for it; don't know what kind" (08/04/2013)    Current Outpatient Prescriptions  Medication Sig Dispense Refill  . ambrisentan (LETAIRIS) 10 MG tablet Take 1 tablet (10 mg total) by mouth daily. 30 tablet 11  . esomeprazole (NEXIUM) 40 MG capsule Take 40 mg by mouth 2 (two) times daily.     . fenofibrate 54 MG tablet Take 1 tablet (54 mg total) by mouth daily. 30 tablet 12  . ferrous sulfate ([facility] IRON) 325 (65 FE) MG tablet Take 325 mg by mouth daily with breakfast.    . FLUoxetine (PROZAC) 20 MG capsule take 1 capsule by mouth every morning 30 capsule 2  . furosemide (LASIX) 40 MG tablet Take 1 tablet (40 mg total) by mouth 2 (two) times daily. 60 tablet 3  . gabapentin (NEURONTIN) 100 MG capsule Take 1 capsule (100 mg total) by mouth 2 (two) times daily. 60 capsule 11  . HYDROcodone-acetaminophen (NORCO) 10-325 MG per tablet Take 1 tablet by mouth every 6 (six) hours as needed for moderate pain.     . pregabalin (LYRICA) 150 MG capsule Take 1 capsule (150 mg total) by mouth 3 (three) times daily. 90 capsule 5  . promethazine (PHENERGAN) 12.5 MG tablet Take 1 tablet (12.5 mg total) by mouth every 6 (six) hours as needed for nausea or vomiting. 45 tablet 1  . Tadalafil, PAH, (ADCIRCA) 20 MG TABS Take 40 mg by mouth daily.    . Treprostinil (TYVASO) 0.6 MG/ML SOLN Inhale 18 mcg into the lungs 4 (four) times daily.      No current facility-administered medications for this encounter.    Filed Vitals:   05/02/14 0915  BP: 72/50  Pulse: 76  Resp: 22  Weight: 159 lb 8 oz (72.349 kg)  SpO2: 96%     PHYSICAL EXAM:  General: Dyspneic Neck: JVP 14-16 cm, no thyromegaly or thyroid nodule.  Lungs: Crackles at bases.  CV: RV heave. Heart regular S1/S2, right-sided S3, 2/6 HSM LLSB. No peripheral edema.  Abdomen: Soft, nontender, no hepatosplenomegaly. Mildly distended.   Neurologic: Alert and oriented x 3.  Psych: Normal affect. Extremities: No clubbing or cyanosis.  ASSESSMENT & PLAN: 64 yo with history of PAH and scleroderma was admitted in 1/16 with severe PAH, right-sided heart failure and large pericardial effusion. Treatment has been limited in past by multiple med intolerances and compliance issues.  1. 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  Echo 03/2014 showed EF 55-60% with severely dilated and moderately dysfunctional RV. There was a large pericardial effusion without definite tamponade. Weight is up 19 lbs and she is short of breath with any exertion. SBP is low by doppler.  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  She will need to be admitted today.  - Admit, place arterial line and PICC line. Follow CVP and co-ox.   - Will try to use milrinone gtt to lower PA pressure/unload RV but may be limited by BP.  - Will need diuresis.  Start Lasix 80 mg IV bid.  - Needs CMET/CBC/BNP now. 2. PAH with cor pulmonale: Severe PAH.  Had recent [place] in 1/16.  Continue ambrisentan and Tyvaso for now, will hold Adcirca while BP is low.  As above, plan to try to get her on milrinone 0.25.   On letaris and adcrica. Started onTyvaso last Thursday.   3. CKD III: Check BMET today. 4. 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  5. Pericardial effusion: Large on last echo, likely due to severe PAH.  Have not done pericardiocentesis due to high risk complications.  Repeat echo to follow pericardial effusion.  [name] 05/02/2014 10:00 AM

## 2014-05-02 NOTE — Addendum Note (Signed)
Encounter addended by: Scarlette Calico, RN on: 05/02/2014 10:13 AM<BR>     Documentation filed: Visit Diagnoses, Dx Association, Orders

## 2014-05-02 NOTE — Addendum Note (Signed)
Encounter addended by: Larey Dresser, MD on: 05/02/2014 10:03 AM<BR>     Documentation filed: Notes Section

## 2014-05-02 NOTE — H&P (Signed)
Primary Cardiologist: Dr Haroldine Laws Pulmonary: Dr Lamonte Sakai   HPI: [name] is a 64 yo female with a history of DM2, HLD, HTN, pHTN, GERD, gastroparesis, scleroderma, DJD and diastolic HF.   Of note she has been on several PAH targeted therapies and coumadin over last 9 years, but has not been reliably on therapy due to noncompliance and inability to have labs performed. She has had best response to Tyvaso (+ bosentan) with an improvement in 6 minute walk and in PASP from 102 mmHg (7/09) to 42 mmHg (9/10). After stopping Tyvaso her PASP rose to 80's by TTE 6/13 and 11/14. Surprisingly, she has never required supplemental O2. Macitentan was started in 11/14. She developed nausea and diarrhea and macitentan was stopped early 3/15. Most recently, she had been on Letairis and Adcirca (followed by Dr. Lamonte Sakai).   Admitted 10/14-10/18/15 for A/C RHF, volume overload and syncope. Diuresed with IV lasix and milrinone a total of 26 lbs. Discharge weight was 156 lbs and started on lasix 40 mg BID.   Admitted 12/29 with dyspnea and chest pain. BP was low, and Lasix and Adcirca were both initially held. Echo showed EF 60-65% with moderately dilated and dysfunctional RV, PASP 111 mmHg with moderate TR. There was a large pericardial effusion without definite tamponade. Presentation was suspected to be due to progressive PAH with secondary RV failure rather than tamponade. No pericardiocentesis yet. She was started on milrinone gtt and diuresed. Shorter-acting sildenafil was started instead of Adcirca. Also had GI bleed. 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 Discharge weight was 146 pounds.   Last echo (1/16) with EF 55-60%, severely dilated RV with moderately decreased systolic function, large pericardial effusion.   She is doing poorly today. Taking all her medications. Using Lasix 80 mg bid currently. She says that she is not urinating well at all and weight is up  19 lbs since last visit. She is short of breath with any exertion. She is nauseated. BP is low, SBP 72 by doppler today. She is lightheaded with standing but not currently. No falls or syncope. Some nausea.  RHC (1/16):  Hemodynamics (mmHg) RA mean 12 RV 64/14 PA 68/29, mean 42 PCWP mean 12 LV 102/14 AO 105/53 PA 67% AO 93% Cardiac Output (Fick) 6.63  Cardiac Index (Fick) 3.88  PVR 4.5 WU  Labs 03/24/2014 K 3.8 Creatinine 1.51 Hgb 9.3  Labs 2/16 BNP 121, K 4.4, creatinine 1.27, hgb 9.2  ROS: All systems negative except as listed in HPI, PMH and Problem List.  SH:  History   Social History  . Marital Status: Legally Separated    Spouse Name: N/A  . Number of Children: 2  . Years of Education: 11   Occupational History  . Umemployed   . Disability     Social History Main Topics  . Smoking status: Never Smoker   . Smokeless tobacco: Never Used  . Alcohol Use: No  . Drug Use: No  . Sexual Activity: Not on file   Other Topics Concern  . Not on file   Social History Narrative   FAMILY HISTORY: Significant for coronary artery disease and diabetes   Patient lives at home with granddaughter.    Patient has 2 children.    Patient has 11 years of education.    Patient is on disability.    Patient is right handed.    Patient is separated.     FH:  Family History  Problem Relation Age of Onset  . Heart disease Mother   . Diabetes Mother   . Diabetes Sister     Past Medical History  Diagnosis Date  . Secondary pulmonary hypertension     right heart cath 04/20/04  . Cough   . Allergic rhinitis, cause unspecified   . Esophageal reflux   . Systemic sclerosis   . Unspecified essential hypertension   . Gastritis   . Sickle cell trait     "trace"  . Obesity   . Visual changes   . Scleroderma   . Diastolic dysfunction   .  Trichomonas   . Vaginal bleeding   . Neuropathy   . CHF (congestive heart failure)   . Type II diabetes mellitus     DIET CONTROL   . Bursitis   . History of blood transfusion     "think it was related to when I had partial hysteretomy; might have been for one of my knee ORs"  . Degeneration of lumbar or lumbosacral intervertebral disc   . Arthritis     "all over my body" (08/04/2013)  . Chronic kidney disease     "been in hospital for it; don't know what kind" (08/04/2013)    Current Outpatient Prescriptions  Medication Sig Dispense Refill  . ambrisentan (LETAIRIS) 10 MG tablet Take 1 tablet (10 mg total) by mouth daily. 30 tablet 11  . esomeprazole (NEXIUM) 40 MG capsule Take 40 mg by mouth 2 (two) times daily.     . fenofibrate 54 MG tablet Take 1 tablet (54 mg total) by mouth daily. 30 tablet 12  . ferrous sulfate ([employer] IRON) 325 (65 FE) MG tablet Take 325 mg by mouth daily with breakfast.    . FLUoxetine (PROZAC) 20 MG capsule take 1 capsule by mouth every morning 30 capsule 2  . furosemide (LASIX) 40 MG tablet Take 1 tablet (40 mg total) by mouth 2 (two) times daily. 60 tablet 3  . gabapentin (NEURONTIN) 100 MG capsule Take 1 capsule (100 mg total) by mouth 2 (two) times daily. 60 capsule 11  . HYDROcodone-acetaminophen (NORCO) 10-325 MG per tablet Take 1 tablet by mouth every 6 (six) hours as needed for moderate pain.     . pregabalin (LYRICA) 150 MG capsule Take 1 capsule (150 mg total) by mouth 3 (three) times daily. 90 capsule 5  . promethazine (PHENERGAN) 12.5 MG tablet Take 1 tablet (12.5 mg total) by mouth every 6 (six) hours as needed for nausea or vomiting. 45 tablet 1  . Tadalafil, PAH, (ADCIRCA) 20 MG TABS Take 40 mg by mouth daily.    . Treprostinil (TYVASO) 0.6 MG/ML SOLN Inhale 18 mcg into the lungs 4 (four) times daily.      No current facility-administered  medications for this encounter.    Filed Vitals:   05/02/14 0915  BP: 72/50  Pulse: 76  Resp: 22  Weight: 159 lb 8 oz (72.349 kg)  SpO2: 96%    PHYSICAL EXAM:  General: Dyspneic Neck: JVP 14-16 cm, no thyromegaly or thyroid nodule.  Lungs: Crackles at bases.  CV: RV heave. Heart regular S1/S2, right-sided S3, 2/6 HSM LLSB. No peripheral edema.  Abdomen: Soft, nontender, no hepatosplenomegaly. Mildly distended.  Neurologic: Alert and oriented x 3.  Psych: Normal affect. Extremities: No clubbing or cyanosis.   ASSESSMENT & PLAN: 64 yo with history of PAH and scleroderma was admitted in 1/16 with severe PAH, right-sided heart failure and large pericardial effusion. Treatment has been  limited in past by multiple med intolerances and compliance issues.  1. 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 Echo 03/2014 showed EF 55-60% with severely dilated and moderately dysfunctional RV. There was a large pericardial effusion without definite tamponade. Weight is up 19 lbs and she is short of breath with any exertion. SBP is low by doppler. 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 She will need to be admitted today.  - Admit to ICU  place arterial line and PICC line. Follow CVP and co-ox.  - Will try to use milrinone gtt to lower PA pressure/unload RV but may be limited by BP.  - Will need diuresis. Start Lasix 80 mg IV bid.  - Needs CMET/CBC/BNP now. 2. PAH with cor pulmonale: Severe PAH. Had recent [place] in 1/16. Continue ambrisentan and Tyvaso for now, will hold Adcirca while BP is low. As above, plan to try to get her on milrinone 0.25.  On letaris and adcrica. Started onTyvaso last Thursday.  3. CKD III: Check BMET today. 4. 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  5. Pericardial effusion: Large on last echo, likely due to severe PAH. Have not done pericardiocentesis due to high risk complications. Repeat echo to  follow pericardial effusion.   Admitted from HF clinic to ICU.   CLEGG,AMY NP-C  .11:04 AM

## 2014-05-02 NOTE — Care Management Note (Addendum)
    Page 1 of 1   05/09/2014     3:41:57 PM CARE MANAGEMENT NOTE 05/09/2014  Patient:  [name], [name]   Account Number:  [id number]  Date Initiated:  05/02/2014  Documentation initiated by:  Elissa Hefty  Subjective/Objective Assessment:   adm w heart fialure     Action/Plan:   lives w fam, pcp dr Tama Headings [facility] for hhc   Anticipated DC Date:     Anticipated DC Plan:  [place]  CM consult      Choice offered to / List presented to:          [facility] arranged  HH-1 RN  [place]   Status of service:   Medicare Important Message given?  YES (If response is "NO", the following Medicare IM given date fields will be blank) Date Medicare IM given:  05/06/2014 Medicare IM given by:  Elissa Hefty Date Additional Medicare IM given:  05/09/2014 Additional Medicare IM given by:  [place]  Discharge Disposition:    Per UR Regulation:  Reviewed for med. necessity/level of care/duration of stay  If discussed at [place] of Stay Meetings, dates discussed:   05/07/2014  05/09/2014    Comments:  2/23 1155a debbie dowell rn,bsn spoke w pt. rec for hri hhrn. [facility] will follow pt at disch. pt in agreement for [facility] to follow at disch. lives w Abel Presto and niece.

## 2014-05-03 ENCOUNTER — Inpatient Hospital Stay (HOSPITAL_COMMUNITY): Payer: Medicare Other

## 2014-05-03 ENCOUNTER — Encounter (HOSPITAL_COMMUNITY)
Admission: AD | Disposition: A | Discharge status: Home / Self Care | Payer: Self-pay | Source: Ambulatory Visit | Attending: Cardiology

## 2014-05-03 ENCOUNTER — Encounter (HOSPITAL_COMMUNITY): Payer: Self-pay | Admitting: *Deleted

## 2014-05-03 DIAGNOSIS — E669 Obesity, unspecified: Secondary | ICD-10-CM | POA: Diagnosis not present

## 2014-05-03 DIAGNOSIS — I272 Other secondary pulmonary hypertension: Secondary | ICD-10-CM | POA: Diagnosis not present

## 2014-05-03 DIAGNOSIS — I309 Acute pericarditis, unspecified: Secondary | ICD-10-CM

## 2014-05-03 DIAGNOSIS — I5033 Acute on chronic diastolic (congestive) heart failure: Secondary | ICD-10-CM | POA: Diagnosis not present

## 2014-05-03 DIAGNOSIS — N183 Chronic kidney disease, stage 3 (moderate): Secondary | ICD-10-CM | POA: Diagnosis not present

## 2014-05-03 DIAGNOSIS — K219 Gastro-esophageal reflux disease without esophagitis: Secondary | ICD-10-CM | POA: Diagnosis not present

## 2014-05-03 DIAGNOSIS — Z8249 Family history of ischemic heart disease and other diseases of the circulatory system: Secondary | ICD-10-CM | POA: Diagnosis not present

## 2014-05-03 DIAGNOSIS — D509 Iron deficiency anemia, unspecified: Secondary | ICD-10-CM | POA: Diagnosis not present

## 2014-05-03 DIAGNOSIS — Q2733 Arteriovenous malformation of digestive system vessel: Secondary | ICD-10-CM | POA: Diagnosis not present

## 2014-05-03 DIAGNOSIS — I313 Pericardial effusion (noninflammatory): Secondary | ICD-10-CM | POA: Diagnosis not present

## 2014-05-03 DIAGNOSIS — E119 Type 2 diabetes mellitus without complications: Secondary | ICD-10-CM | POA: Diagnosis not present

## 2014-05-03 DIAGNOSIS — I270 Primary pulmonary hypertension: Secondary | ICD-10-CM

## 2014-05-03 DIAGNOSIS — I2781 Cor pulmonale (chronic): Secondary | ICD-10-CM | POA: Diagnosis not present

## 2014-05-03 DIAGNOSIS — I959 Hypotension, unspecified: Secondary | ICD-10-CM | POA: Diagnosis not present

## 2014-05-03 DIAGNOSIS — D573 Sickle-cell trait: Secondary | ICD-10-CM | POA: Diagnosis not present

## 2014-05-03 DIAGNOSIS — M349 Systemic sclerosis, unspecified: Secondary | ICD-10-CM | POA: Diagnosis not present

## 2014-05-03 DIAGNOSIS — Z9119 Patient's noncompliance with other medical treatment and regimen: Secondary | ICD-10-CM | POA: Diagnosis not present

## 2014-05-03 DIAGNOSIS — I4891 Unspecified atrial fibrillation: Secondary | ICD-10-CM | POA: Diagnosis not present

## 2014-05-03 DIAGNOSIS — I129 Hypertensive chronic kidney disease with stage 1 through stage 4 chronic kidney disease, or unspecified chronic kidney disease: Secondary | ICD-10-CM | POA: Diagnosis not present

## 2014-05-03 DIAGNOSIS — R339 Retention of urine, unspecified: Secondary | ICD-10-CM | POA: Diagnosis not present

## 2014-05-03 DIAGNOSIS — Z6824 Body mass index (BMI) 24.0-24.9, adult: Secondary | ICD-10-CM | POA: Diagnosis not present

## 2014-05-03 HISTORY — PX: PERICARDIAL TAP: SHX5486

## 2014-05-03 LAB — CARBOXYHEMOGLOBIN
Carboxyhemoglobin: 1.4 % (ref 0.5–1.5)
Methemoglobin: 1.2 % (ref 0.0–1.5)
O2 SAT: 71.7 %
TOTAL HEMOGLOBIN: 8.6 g/dL — AB (ref 12.0–16.0)

## 2014-05-03 LAB — BASIC METABOLIC PANEL
Anion gap: 4 — ABNORMAL LOW (ref 5–15)
BUN: 47 mg/dL — AB (ref 6–23)
CO2: 29 mmol/L (ref 19–32)
CREATININE: 1.88 mg/dL — AB (ref 0.50–1.10)
Calcium: 9.1 mg/dL (ref 8.4–10.5)
Chloride: 104 mmol/L (ref 96–112)
GFR calc non Af Amer: 28 mL/min — ABNORMAL LOW (ref >90)
GFR, EST AFRICAN AMERICAN: 32 mL/min — AB (ref >90)
GLUCOSE: 126 mg/dL — AB (ref 70–99)
Potassium: 3.4 mmol/L — ABNORMAL LOW (ref 3.5–5.1)
Sodium: 137 mmol/L (ref 135–145)

## 2014-05-03 LAB — PROTEIN, BODY FLUID: Total protein, fluid: 6.5 g/dL

## 2014-05-03 LAB — LACTATE DEHYDROGENASE, PLEURAL OR PERITONEAL FLUID: LD, Fluid: 161 U/L — ABNORMAL HIGH (ref 3–23)

## 2014-05-03 LAB — BODY FLUID CELL COUNT WITH DIFFERENTIAL
Eos, Fluid: 2 %
LYMPHS FL: 26 %
MONOCYTE-MACROPHAGE-SEROUS FLUID: 48 % — AB (ref 50–90)
Neutrophil Count, Fluid: 24 % (ref 0–25)
Total Nucleated Cell Count, Fluid: 2201 cu mm — ABNORMAL HIGH (ref 0–1000)

## 2014-05-03 LAB — CREATININE, FLUID (PLEURAL, PERITONEAL, JP DRAINAGE): CREAT FL: 1.7 mg/dL

## 2014-05-03 LAB — GLUCOSE, SEROUS FLUID: Glucose, Fluid: 110 mg/dL

## 2014-05-03 LAB — CLOSTRIDIUM DIFFICILE BY PCR: CDIFFPCR: NEGATIVE

## 2014-05-03 SURGERY — PERICARDIAL TAP

## 2014-05-03 MED ORDER — FUROSEMIDE 10 MG/ML IJ SOLN
12.0000 mg/h | INTRAVENOUS | Status: DC
  Administered 2014-05-03 – 2014-05-04 (×2): 12 mg/h via INTRAVENOUS
  Filled 2014-05-03 (×3): qty 25

## 2014-05-03 MED ORDER — FENTANYL CITRATE 0.05 MG/ML IJ SOLN
INTRAMUSCULAR | Status: AC
  Filled 2014-05-03: qty 2

## 2014-05-03 MED ORDER — NOREPINEPHRINE BITARTRATE 1 MG/ML IV SOLN
0.0000 ug/min | INTRAVENOUS | Status: DC
  Administered 2014-05-03: 6 ug/min via INTRAVENOUS
  Filled 2014-05-03: qty 16

## 2014-05-03 MED ORDER — HEPARIN (PORCINE) IN NACL 2-0.9 UNIT/ML-% IJ SOLN
INTRAMUSCULAR | Status: AC
  Filled 2014-05-03: qty 500

## 2014-05-03 MED ORDER — POTASSIUM CHLORIDE CRYS ER 20 MEQ PO TBCR
40.0000 meq | EXTENDED_RELEASE_TABLET | Freq: Once | ORAL | Status: AC
  Administered 2014-05-03: 40 meq via ORAL
  Filled 2014-05-03: qty 2

## 2014-05-03 MED ORDER — LIDOCAINE HCL (PF) 1 % IJ SOLN
INTRAMUSCULAR | Status: AC
  Filled 2014-05-03: qty 30

## 2014-05-03 NOTE — Progress Notes (Signed)
Echocardiogram 2D Echocardiogram has been performed.  [name] 05/03/2014, 12:42 PM

## 2014-05-03 NOTE — CV Procedure (Signed)
    PERICARDIOCENTESIS NOTE  [name]  64 y.o.  female [birth year]  Procedure Date: 05/03/2014 Referring Physician: Einar Crow, MD Primary Cardiologist: Einar Crow, MD   INDICATIONS: Massive pericardial effusion with tamponade.           PROCEDURE: 1. Sub-xiphoid pericardiocentesis; 2. Electrocardiographic guidance  CONSENT:  The risks, benefits, and details of the procedure were explained in detail to the patient. Risks including death, heart laceration, liver or intestinal laceration, pneumothorax, bleeding and infection were discussed.  The patient verbalized understanding and wanted to proceed.  Informed written consent was obtained.  PROCEDURE TECHNIQUE:    The subxiphoid area was sterilely prepped and draped. Using both fluoroscopic and electrocardiographic guidance , pericardiocentesis was performed from the left subxiphoid angle without complications. Heavy 1% Xylocaine infiltration was given prior to pericardiocentesis. After entering the cardiac a 0.035 wire was advanced into the pericardial space, the tract was then dilated with an introducer sheath, and finally a multihole 8 French drainage catheter was advanced into place and fluid was removed. The fluid was grossly bloody with greater than a liter retrieved without hemodynamic consequences. The catheter was sewn in place. No immediate complications were noted. The drainage catheter was attached to a suction device and the patient returned to the coronary care unit in stable condition.   IMPRESSIONS:  Successful sub-xiphoid approach pericardiocentesis with removal of greater than 1 L of grossly bloody fluid. The patient remained hemodynamically stable.   RECOMMENDATION:  The drainage tube can be removed within the next 24-48 hours depending upon the amount of drainage.Marland Kitchen

## 2014-05-03 NOTE — Progress Notes (Addendum)
Advanced Heart Failure Rounding Note   Subjective:    Admitted yesterday with marked volume overload. PICC placed and she was started on milrinone at 0.25 mcg. Over night norepi added at 6 mcg due to hypotension, now titrated down to 2. Weight down 5 pounds but on a bed scale. Having frequent BMs.   Denies SOB  CVP 17  Creatinine 2.0>1.8  Objective:   Weight Range:  Vital Signs:   Temp:  [97.7 F (36.5 C)-98.1 F (36.7 C)] 98.1 F (36.7 C) (02/19 0400) Pulse Rate:  [60-87] 78 (02/19 0430) Resp:  [11-21] 11 (02/19 0430) BP: (80-105)/(42-64) 80/42 mmHg (02/18 2302) SpO2:  [94 %-100 %] 100 % (02/19 0430) Arterial Line BP: (83-139)/(42-66) 98/51 mmHg (02/19 0730) Weight:  [150 lb 6.4 oz (68.221 kg)-155 lb 4.8 oz (70.444 kg)] 150 lb 6.4 oz (68.221 kg) (02/19 0400) Last BM Date: 05/02/14  Weight change: Filed Weights   05/02/14 1109 05/03/14 0400  Weight: 155 lb 4.8 oz (70.444 kg) 150 lb 6.4 oz (68.221 kg)    Intake/Output:   Intake/Output Summary (Last 24 hours) at 05/03/14 0747 Last data filed at 05/03/14 0700  Gross per 24 hour  Intake 706.39 ml  Output   1850 ml  Net -1143.61 ml     PHYSICAL EXAM: General: NAD  Neck: JVP 14-16 cm, no thyromegaly or thyroid nodule.  Lungs: Crackles at bases.  CV: RV heave. Heart regular S1/S2, right-sided S3, 2/6 HSM LLSB. No peripheral edema.  Abdomen: Soft, nontender, no hepatosplenomegaly. Mildly distended.  Neurologic: Alert and oriented x 3.  Psych: Normal affect. Extremities: No clubbing or cyanosis. LUE PICC  Telemetry: SR 90s  Labs: Basic Metabolic Panel:  Recent Labs Lab 04/26/14 1723 05/02/14 1022 05/03/14 0400  NA 141 136 137  K 4.4 4.8 3.4*  CL 109 104 104  CO2 _0 GLUCOSE 79 116* 126*  BUN 41* 51* 47*  CREATININE 1.27* 2.00* 1.88*  CALCIUM 9.5 9.1 9.1    Liver Function Tests:  Recent Labs Lab 05/02/14 1022  AST 45*  ALT 12  ALKPHOS 141*  BILITOT 1.0  PROT 7.5  ALBUMIN 3.3*    No results for input(s): LIPASE, AMYLASE in the last 168 hours. No results for input(s): AMMONIA in the last 168 hours.  CBC:  Recent Labs Lab 04/26/14 1723 05/02/14 1022  WBC 5.2 7.0  HGB 9.2* 8.7*  HCT 28.6* 26.1*  MCV 81.5 79.1  PLT 168 195    Cardiac Enzymes:  Recent Labs Lab 04/26/14 1723  TROPONINI <0.03    BNP: BNP (last 3 results)  Recent Labs  03/15/14 0300 04/26/14 1716 05/02/14 1022  BNP 223.6* 120.9* 670.3*    ProBNP (last 3 results)  Recent Labs  06/09/13 0825 09/12/13 1121 12/26/13 1210  PROBNP 2707.0* 1481* 1194*      Other results:  EKG:   Imaging: Dg Chest Port 1 View  05/03/2014   CLINICAL DATA:  Shortness of breath  EXAM: PORTABLE CHEST - 1 VIEW  COMPARISON:  04/26/2014  FINDINGS: Cardiac shadow remains enlarged. A left-sided PICC line is now noted in satisfactory position. The lungs are well aerated bilaterally. Mild vascular congestion is noted. Bibasilar infiltrative changes are seen stable from the prior exam.  IMPRESSION: Mild vascular congestion.  Bibasilar atelectatic changes are noted.   Electronically Signed   By: Inez Catalina M.D.   On: 05/03/2014 07:30      Medications:     Scheduled Medications: . ambrisentan  10  mg Oral Daily  . antiseptic oral rinse  7 mL Mouth Rinse BID  . enoxaparin (LOVENOX) injection  30 mg Subcutaneous Q24H  . FLUoxetine  20 mg Oral Daily  . furosemide  80 mg Intravenous BID  . gabapentin  100 mg Oral BID  . pneumococcal 23 valent vaccine  0.5 mL Intramuscular Tomorrow-1000  . pregabalin  150 mg Oral TID  . sodium chloride  10-40 mL Intracatheter Q12H  . sodium chloride  3 mL Intravenous Q12H  . Treprostinil  18 mcg Inhalation QID     Infusions: . milrinone 0.25 mcg/kg/min (05/03/14 0600)  . norepinephrine (LEVOPHED) Adult infusion 6 mcg/min (05/03/14 0300)     PRN Medications:  sodium chloride, acetaminophen, guaiFENesin-dextromethorphan, HYDROcodone-acetaminophen,  ondansetron (ZOFRAN) IV, sodium chloride, sodium chloride   Assessment/Plan    64 yo with history of PAH and scleroderma was admitted in 1/16 with severe PAH, right-sided heart failure and large pericardial effusion. Treatment has been limited in past by multiple med intolerances and compliance issues.  1. 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 Echo 03/2014 showed EF 55-60% with severely dilated and moderately dysfunctional RV. There was a large pericardial effusion without definite tamponade. Weight down 5 pounds but on a different scale. On milrinone 0.25 mcg/kg/min for RV support and norepi at 2 mcg. Norepi started over night, BP much better this morning. Renal function improving. UOP not vigorous with Lasix boluses, will place on gtt. 2. PAH with cor pulmonale: Severe PAH. Had recent [place] in 1/16. Continue ambrisentan and Tyvaso for now, will hold Adcirca while BP is low. Continue  milrinone 0.25 mcg/kg/min.   3. CKD III: Renal function improving this morning on milrinone.  4. 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  5. Pericardial effusion: Large on last echo, likely due to severe PAH. Have not done pericardiocentesis due to high risk complications. Repeat echo today to follow pericardial effusion.  Length of Stay: 1   CLEGG,AMY NP-C 05/03/2014, 7:47 AM  Advanced Heart Failure Team Pager [phone] (M-F; [place])  Please contact [facility] for night-coverage after hours (4p -7a ) and weekends on [email or web address]  Patient seen with NP, agree with the above note.  BP good this morning, titrating off norepinephrine (now down to 2).  Will continue milrinone for RV support/severe PAH.  Good co-ox this morning, 71%.  Creatinine mildly lower today.  CVP remains high, not diuresing particularly well yet.  Will place on Lasix gtt.   [name] 05/03/2014 9:31 AM  Today's echo was reviewed.  Pericardial effusion is clearly worse than prior, now heart has  rocking motion.  Tamponade appears to be present with dilated IVC and a degree of RV early diastolic collapse.  Though higher risk given pulmonary hypertension, I think that we are going to have to move toward pericardiocentesis.  Will discuss with patient and family.   [name] 05/03/2014 1:14 PM

## 2014-05-03 NOTE — Progress Notes (Signed)
Patient potassium level is 3.4. Paged MD Karlyn Agee to inform of same. Vicie Mutters, RN

## 2014-05-03 NOTE — Interval H&P Note (Signed)
History and Physical Interval Note:  05/03/2014 2:11 PM  Chronically ill 64 yo female with Scleroderma who has massive pericardial effusion, tamponade, and severe pulmonary hypertension. She has undergone diuresis and is nearly 2 liters negative since admission. Overall prognosis appears poor. There is Stage 3-4 CKD. No known CAD. Dr. Aundra Dubin as done a good job of describing the high risk of the procedure to include death, bleeding, pneumothorax, liver and heart laceration, arrhythmia, and infection. Despite the risk, she is willing to proceed.  I discussed the [procedure with the patient who has been well informed about the risk of death, pneumothorax, liver laceration, heart laceration, death, and bleeding. She understands she has few options and is willing to proceed. [name]  has presented today for surgery, with the diagnosis of tampinode  The various methods of treatment have been discussed with the patient and family. After consideration of risks, benefits and other options for treatment, the patient has consented to  Procedure(s): PERICARDIAL TAP (N/A) as a surgical intervention .  The patient's history has been reviewed, patient examined, no change in status, stable for surgery.  I have reviewed the patient's chart and labs.  Questions were answered to the patient's satisfaction.     Sinclair Grooms

## 2014-05-03 NOTE — H&P (View-Only) (Signed)
Advanced Heart Failure Rounding Note   Subjective:    Admitted yesterday with marked volume overload. PICC placed and she was started on milrinone at 0.25 mcg. Over night norepi added at 6 mcg due to hypotension, now titrated down to 2. Weight down 5 pounds but on a bed scale. Having frequent BMs.   Denies SOB  CVP 17  Creatinine 2.0>1.8  Objective:   Weight Range:  Vital Signs:   Temp:  [97.7 F (36.5 C)-98.1 F (36.7 C)] 98.1 F (36.7 C) (02/19 0400) Pulse Rate:  [60-87] 78 (02/19 0430) Resp:  [11-21] 11 (02/19 0430) BP: (80-105)/(42-64) 80/42 mmHg (02/18 2302) SpO2:  [94 %-100 %] 100 % (02/19 0430) Arterial Line BP: (83-139)/(42-66) 98/51 mmHg (02/19 0730) Weight:  [150 lb 6.4 oz (68.221 kg)-155 lb 4.8 oz (70.444 kg)] 150 lb 6.4 oz (68.221 kg) (02/19 0400) Last BM Date: 05/02/14  Weight change: Filed Weights   05/02/14 1109 05/03/14 0400  Weight: 155 lb 4.8 oz (70.444 kg) 150 lb 6.4 oz (68.221 kg)    Intake/Output:   Intake/Output Summary (Last 24 hours) at 05/03/14 0747 Last data filed at 05/03/14 0700  Gross per 24 hour  Intake 706.39 ml  Output   1850 ml  Net -1143.61 ml     PHYSICAL EXAM: General: NAD  Neck: JVP 14-16 cm, no thyromegaly or thyroid nodule.  Lungs: Crackles at bases.  CV: RV heave. Heart regular S1/S2, right-sided S3, 2/6 HSM LLSB. No peripheral edema.  Abdomen: Soft, nontender, no hepatosplenomegaly. Mildly distended.  Neurologic: Alert and oriented x 3.  Psych: Normal affect. Extremities: No clubbing or cyanosis. LUE PICC  Telemetry: SR 90s  Labs: Basic Metabolic Panel:  Recent Labs Lab 04/26/14 1723 05/02/14 1022 05/03/14 0400  NA 141 136 137  K 4.4 4.8 3.4*  CL 109 104 104  CO2 _0 GLUCOSE 79 116* 126*  BUN 41* 51* 47*  CREATININE 1.27* 2.00* 1.88*  CALCIUM 9.5 9.1 9.1    Liver Function Tests:  Recent Labs Lab 05/02/14 1022  AST 45*  ALT 12  ALKPHOS 141*  BILITOT 1.0  PROT 7.5  ALBUMIN 3.3*    No results for input(s): LIPASE, AMYLASE in the last 168 hours. No results for input(s): AMMONIA in the last 168 hours.  CBC:  Recent Labs Lab 04/26/14 1723 05/02/14 1022  WBC 5.2 7.0  HGB 9.2* 8.7*  HCT 28.6* 26.1*  MCV 81.5 79.1  PLT 168 195    Cardiac Enzymes:  Recent Labs Lab 04/26/14 1723  TROPONINI <0.03    BNP: BNP (last 3 results)  Recent Labs  03/15/14 0300 04/26/14 1716 05/02/14 1022  BNP 223.6* 120.9* 670.3*    ProBNP (last 3 results)  Recent Labs  06/09/13 0825 09/12/13 1121 12/26/13 1210  PROBNP 2707.0* 1481* 1194*      Other results:  EKG:   Imaging: Dg Chest Port 1 View  05/03/2014   CLINICAL DATA:  Shortness of breath  EXAM: PORTABLE CHEST - 1 VIEW  COMPARISON:  04/26/2014  FINDINGS: Cardiac shadow remains enlarged. A left-sided PICC line is now noted in satisfactory position. The lungs are well aerated bilaterally. Mild vascular congestion is noted. Bibasilar infiltrative changes are seen stable from the prior exam.  IMPRESSION: Mild vascular congestion.  Bibasilar atelectatic changes are noted.   Electronically Signed   By: Inez Catalina M.D.   On: 05/03/2014 07:30      Medications:     Scheduled Medications: . ambrisentan  10  mg Oral Daily  . antiseptic oral rinse  7 mL Mouth Rinse BID  . enoxaparin (LOVENOX) injection  30 mg Subcutaneous Q24H  . FLUoxetine  20 mg Oral Daily  . furosemide  80 mg Intravenous BID  . gabapentin  100 mg Oral BID  . pneumococcal 23 valent vaccine  0.5 mL Intramuscular Tomorrow-1000  . pregabalin  150 mg Oral TID  . sodium chloride  10-40 mL Intracatheter Q12H  . sodium chloride  3 mL Intravenous Q12H  . Treprostinil  18 mcg Inhalation QID     Infusions: . milrinone 0.25 mcg/kg/min (05/03/14 0600)  . norepinephrine (LEVOPHED) Adult infusion 6 mcg/min (05/03/14 0300)     PRN Medications:  sodium chloride, acetaminophen, guaiFENesin-dextromethorphan, HYDROcodone-acetaminophen,  ondansetron (ZOFRAN) IV, sodium chloride, sodium chloride   Assessment/Plan    64 yo with history of PAH and scleroderma was admitted in 1/16 with severe PAH, right-sided heart failure and large pericardial effusion. Treatment has been limited in past by multiple med intolerances and compliance issues.  1. 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 Echo 03/2014 showed EF 55-60% with severely dilated and moderately dysfunctional RV. There was a large pericardial effusion without definite tamponade. Weight down 5 pounds but on a different scale. On milrinone 0.25 mcg/kg/min for RV support and norepi at 2 mcg. Norepi started over night, BP much better this morning. Renal function improving. UOP not vigorous with Lasix boluses, will place on gtt. 2. PAH with cor pulmonale: Severe PAH. Had recent [place] in 1/16. Continue ambrisentan and Tyvaso for now, will hold Adcirca while BP is low. Continue  milrinone 0.25 mcg/kg/min.   3. CKD III: Renal function improving this morning on milrinone.  4. 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  5. Pericardial effusion: Large on last echo, likely due to severe PAH. Have not done pericardiocentesis due to high risk complications. Repeat echo today to follow pericardial effusion.  Length of Stay: 1   CLEGG,AMY NP-C 05/03/2014, 7:47 AM  Advanced Heart Failure Team Pager [phone] (M-F; [place])  Please contact [facility] for night-coverage after hours (4p -7a ) and weekends on [email or web address]  Patient seen with NP, agree with the above note.  BP good this morning, titrating off norepinephrine (now down to 2).  Will continue milrinone for RV support/severe PAH.  Good co-ox this morning, 71%.  Creatinine mildly lower today.  CVP remains high, not diuresing particularly well yet.  Will place on Lasix gtt.   [name] 05/03/2014 9:31 AM  Today's echo was reviewed.  Pericardial effusion is clearly worse than prior, now heart has  rocking motion.  Tamponade appears to be present with dilated IVC and a degree of RV early diastolic collapse.  Though higher risk given pulmonary hypertension, I think that we are going to have to move toward pericardiocentesis.  Will discuss with patient and family.   [name] 05/03/2014 1:14 PM

## 2014-05-04 ENCOUNTER — Encounter (HOSPITAL_COMMUNITY): Payer: Self-pay | Admitting: Interventional Cardiology

## 2014-05-04 DIAGNOSIS — I9589 Other hypotension: Secondary | ICD-10-CM

## 2014-05-04 DIAGNOSIS — I314 Cardiac tamponade: Secondary | ICD-10-CM

## 2014-05-04 DIAGNOSIS — I4891 Unspecified atrial fibrillation: Secondary | ICD-10-CM

## 2014-05-04 DIAGNOSIS — I279 Pulmonary heart disease, unspecified: Secondary | ICD-10-CM

## 2014-05-04 DIAGNOSIS — I2609 Other pulmonary embolism with acute cor pulmonale: Secondary | ICD-10-CM

## 2014-05-04 LAB — PH, BODY FLUID: pH, Fluid: 8.5

## 2014-05-04 LAB — BASIC METABOLIC PANEL
ANION GAP: 8 (ref 5–15)
BUN: 39 mg/dL — AB (ref 6–23)
CHLORIDE: 101 mmol/L (ref 96–112)
CO2: 28 mmol/L (ref 19–32)
CREATININE: 1.68 mg/dL — AB (ref 0.50–1.10)
Calcium: 9.5 mg/dL (ref 8.4–10.5)
GFR calc non Af Amer: 32 mL/min — ABNORMAL LOW (ref >90)
GFR, EST AFRICAN AMERICAN: 37 mL/min — AB (ref >90)
Glucose, Bld: 128 mg/dL — ABNORMAL HIGH (ref 70–99)
Potassium: 3.6 mmol/L (ref 3.5–5.1)
Sodium: 137 mmol/L (ref 135–145)

## 2014-05-04 LAB — CREATININE, SERUM
Creatinine, Ser: 1.73 mg/dL — ABNORMAL HIGH (ref 0.50–1.10)
GFR calc Af Amer: 35 mL/min — ABNORMAL LOW (ref >90)
GFR, EST NON AFRICAN AMERICAN: 30 mL/min — AB (ref >90)

## 2014-05-04 LAB — CBC
HEMATOCRIT: 30.1 % — AB (ref 36.0–46.0)
Hemoglobin: 9.9 g/dL — ABNORMAL LOW (ref 12.0–15.0)
MCH: 26.3 pg (ref 26.0–34.0)
MCHC: 32.9 g/dL (ref 30.0–36.0)
MCV: 79.8 fL (ref 78.0–100.0)
PLATELETS: 232 10*3/uL (ref 150–400)
RBC: 3.77 MIL/uL — AB (ref 3.87–5.11)
RDW: 16.3 % — AB (ref 11.5–15.5)
WBC: 11.6 10*3/uL — ABNORMAL HIGH (ref 4.0–10.5)

## 2014-05-04 LAB — GLUCOSE, RANDOM: Glucose, Bld: 131 mg/dL — ABNORMAL HIGH (ref 70–99)

## 2014-05-04 LAB — IRON AND TIBC
IRON: 21 ug/dL — AB (ref 42–145)
SATURATION RATIOS: 7 % — AB (ref 20–55)
TIBC: 309 ug/dL (ref 250–470)
UIBC: 288 ug/dL (ref 125–400)

## 2014-05-04 LAB — PROTEIN, PERICARDIAL FLUID: Protein, Pericardial Fluid: 6.3 g/dL

## 2014-05-04 LAB — CARBOXYHEMOGLOBIN
CARBOXYHEMOGLOBIN: 2.3 % — AB (ref 0.5–1.5)
METHEMOGLOBIN: 1.3 % (ref 0.0–1.5)
O2 SAT: 67 %
TOTAL HEMOGLOBIN: 10.2 g/dL — AB (ref 12.0–16.0)

## 2014-05-04 LAB — PROTEIN, TOTAL: Total Protein: 7.3 g/dL (ref 6.0–8.3)

## 2014-05-04 LAB — AMYLASE: AMYLASE: 72 U/L (ref 0–105)

## 2014-05-04 MED ORDER — AMIODARONE HCL IN DEXTROSE 360-4.14 MG/200ML-% IV SOLN
30.0000 mg/h | INTRAVENOUS | Status: DC
  Administered 2014-05-04 (×2): 30 mg/h via INTRAVENOUS
  Filled 2014-05-04 (×5): qty 200

## 2014-05-04 MED ORDER — DIGOXIN 0.25 MG/ML IJ SOLN
0.2500 mg | INTRAMUSCULAR | Status: AC
  Administered 2014-05-04 (×2): 0.25 mg via INTRAVENOUS
  Filled 2014-05-04 (×2): qty 1

## 2014-05-04 MED ORDER — POTASSIUM CHLORIDE CRYS ER 20 MEQ PO TBCR
40.0000 meq | EXTENDED_RELEASE_TABLET | Freq: Once | ORAL | Status: AC
  Administered 2014-05-04: 40 meq via ORAL
  Filled 2014-05-04: qty 2

## 2014-05-04 MED ORDER — AMIODARONE HCL IN DEXTROSE 360-4.14 MG/200ML-% IV SOLN
INTRAVENOUS | Status: AC
  Administered 2014-05-04: 200 mL
  Filled 2014-05-04: qty 200

## 2014-05-04 MED ORDER — SODIUM CHLORIDE 0.9 % IV BOLUS (SEPSIS)
250.0000 mL | Freq: Once | INTRAVENOUS | Status: AC
  Administered 2014-05-04: 250 mL via INTRAVENOUS

## 2014-05-04 MED ORDER — AMIODARONE LOAD VIA INFUSION
150.0000 mg | Freq: Once | INTRAVENOUS | Status: AC
  Administered 2014-05-04: 150 mg via INTRAVENOUS
  Filled 2014-05-04: qty 83.34

## 2014-05-04 MED ORDER — AMIODARONE HCL IN DEXTROSE 360-4.14 MG/200ML-% IV SOLN
60.0000 mg/h | INTRAVENOUS | Status: AC
  Administered 2014-05-04: 60 mg/h via INTRAVENOUS
  Filled 2014-05-04: qty 200

## 2014-05-04 NOTE — Progress Notes (Signed)
Patient heart rate increased to 120-140's starting at 0445. EKG shows A-fib RVR. Patient asymptomatic with BP requiring increased titration of levophed.  Discussed with MD Oleta Mouse, advised to continue to monitor and to call back if heart rate sustained at or above 140 or if patient becomes symptomatic. Vicie Mutters, RN

## 2014-05-04 NOTE — Progress Notes (Signed)
Patient unable to void after trying multiple times on the bedpan and an attempt on the bedside commode.  Bladder scan showed 680 mL of urine.  Performed a foley huddle and determined foley was warranted for acute urinary retention as patient is on lasix drip, unable to void, has a pericardial drain, and is on milrinone with strict I&O ordered. Performed peri care before and after foley insertion. Inserted 16 [place] Foley. Patient tolerated well. Clear yellow urine returned. Pamela Moron RN assisted.  Vicie Mutters, RN

## 2014-05-04 NOTE — Progress Notes (Signed)
Patient heart rate increased up to 160's.  Increased heart rate was not sustained, fluctuated between 90's to 160's. Patient asymptomatic.  Spoke with MD Oleta Mouse, MD advised to monitor patient and to contact him if increased heart rate is sustained and/or if patient symptomatic. Vicie Mutters, RN

## 2014-05-04 NOTE — Significant Event (Signed)
Nurse reported newly developed Afib with RVR with rate 120 to 160s, pt is on Mirilnone 0.25 for RV failure, now has increased requirement for Norepi due to Afib RVR and volume depletion. She is asymptomatic s/p pericardiocentesis yesterday.   Considering her RV failure,  paroxysmal Afib RVR with some hemodynamic effects, Digoxin is a reasonable choice for both RV inotrope profile and rate control with favorable BP effect.  Due to her renal function, will do a slow load 0.25 mg x2, check level in AM. Will place pharm consult for drug interaction.   Manus Gunning, MD

## 2014-05-04 NOTE — Progress Notes (Addendum)
Advanced Heart Failure Rounding Note   Subjective:    Underwent pericardiocentesis on 2/19 with over 1L of bloody fluid out. 45% monocytes. LDH 161  Remains on norepi and milrinone. Diuresing well on IV lasix. Renal function improving. However having to go up on norepi. CVP checked personally = 2.   Developed AF with RVR over night. I started IV amio earlier this am. Now back in NSR. About 30cc from drain this am. Foley placed overnight for urinary retention with 700cc out.   Objective:   Weight Range:  Vital Signs:   Temp:  [97.5 F (36.4 C)-99.7 F (37.6 C)] 98.3 F (36.8 C) (02/20 0400) Pulse Rate:  [78-165] 126 (02/20 0700) Resp:  [10-21] 12 (02/20 0700) SpO2:  [91 %-98 %] 98 % (02/20 0700) Arterial Line BP: (72-134)/(33-66) 106/52 mmHg (02/20 0700) Weight:  [64 kg (141 lb 1.5 oz)] 64 kg (141 lb 1.5 oz) (02/20 0400) Last BM Date: 05/02/14  Weight change: Filed Weights   05/03/14 0400 05/03/14 0800 05/04/14 0400  Weight: 68.221 kg (150 lb 6.4 oz) 68.7 kg (151 lb 7.3 oz) 64 kg (141 lb 1.5 oz)    Intake/Output:   Intake/Output Summary (Last 24 hours) at 05/04/14 1102 Last data filed at 05/04/14 0800  Gross per 24 hour  Intake 1017.34 ml  Output   3680 ml  Net -2662.66 ml     PHYSICAL EXAM: General: NAD  Neck: JVP flat, no thyromegaly or thyroid nodule.  Lungs: Crackles at bases.  CV: RV heave. Heart regular S1/S2, right-sided S3, loud p2 2/6 HSM LLSB. No peripheral edema. + drain Abdomen: Soft, nontender, no hepatosplenomegaly. Mildly distended.  Neurologic: Alert and oriented x 3.  Psych: Normal affect. Extremities: No clubbing or cyanosis. LUE PICC  Telemetry: SR 80s AF with RVR overnight  Labs: Basic Metabolic Panel:  Recent Labs Lab 05/02/14 1022 05/03/14 0400 05/03/14 2335 05/04/14 0430  NA 136 137  --  137  K 4.8 3.4*  --  3.6  CL 104 104  --  101  CO2 22 29  --  28  GLUCOSE 116* 126* 131* 128*  BUN 51* 47*  --  39*  CREATININE  2.00* 1.88* 1.73* 1.68*  CALCIUM 9.1 9.1  --  9.5    Liver Function Tests:  Recent Labs Lab 05/02/14 1022 05/03/14 2335  AST 45*  --   ALT 12  --   ALKPHOS 141*  --   BILITOT 1.0  --   PROT 7.5 7.3  ALBUMIN 3.3*  --     Recent Labs Lab 05/03/14 2335  AMYLASE 72   No results for input(s): AMMONIA in the last 168 hours.  CBC:  Recent Labs Lab 05/02/14 1022 05/04/14 0430  WBC 7.0 11.6*  HGB 8.7* 9.9*  HCT 26.1* 30.1*  MCV 79.1 79.8  PLT 195 232    Cardiac Enzymes: No results for input(s): CKTOTAL, CKMB, CKMBINDEX, TROPONINI in the last 168 hours.  BNP: BNP (last 3 results)  Recent Labs  03/15/14 0300 04/26/14 1716 05/02/14 1022  BNP 223.6* 120.9* 670.3*    ProBNP (last 3 results)  Recent Labs  06/09/13 0825 09/12/13 1121 12/26/13 1210  PROBNP 2707.0* 1481* 1194*      Other results:  EKG:   Imaging: Dg Chest Port 1 View  05/03/2014   CLINICAL DATA:  Shortness of breath  EXAM: PORTABLE CHEST - 1 VIEW  COMPARISON:  04/26/2014  FINDINGS: Cardiac shadow remains enlarged. A left-sided PICC line is now noted  in satisfactory position. The lungs are well aerated bilaterally. Mild vascular congestion is noted. Bibasilar infiltrative changes are seen stable from the prior exam.  IMPRESSION: Mild vascular congestion.  Bibasilar atelectatic changes are noted.   Electronically Signed   By: Inez Catalina M.D.   On: 05/03/2014 07:30     Medications:     Scheduled Medications: . ambrisentan  10 mg Oral Daily  . enoxaparin (LOVENOX) injection  30 mg Subcutaneous Q24H  . FLUoxetine  20 mg Oral Daily  . gabapentin  100 mg Oral BID  . pregabalin  150 mg Oral TID  . sodium chloride  10-40 mL Intracatheter Q12H  . Treprostinil  18 mcg Inhalation QID    Infusions: . amiodarone 60 mg/hr (05/04/14 1033)   Followed by  . amiodarone    . furosemide (LASIX) infusion 12 mg/hr (05/04/14 0981)  . milrinone 0.25 mcg/kg/min (05/04/14 0356)  . norepinephrine  (LEVOPHED) Adult infusion 11 mcg/min (05/04/14 0800)    PRN Medications: sodium chloride, acetaminophen, guaiFENesin-dextromethorphan, HYDROcodone-acetaminophen, ondansetron (ZOFRAN) IV, sodium chloride   Assessment/Plan    64 yo with history of PAH and scleroderma was admitted in 1/16 with severe PAH, right-sided heart failure and large pericardial effusion. Treatment has been limited in past by multiple med intolerances and compliance issues.  1. 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 Echo 03/2014 showed EF 55-60% with severely dilated and moderately dysfunctional RV.  2. PAH with cor pulmonale: Severe PAH. Had recent [place] in 1/16. Continue ambrisentan and Tyvaso for now, will hold Adcirca while BP is low. Continue  milrinone 0.25 mcg/kg/min.   3. CKD III: Renal function improving on milrinone.  4. 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  5. Pericardial effusion: s/p pericardiocentesis. Cytology pending 6. AF with RVR - now in NSR on amio 7. Hypotension 8. Microcytic anemia. - check iron stores. May benefit from [facility]  She is s/p pericardiocentesis with large amount of fluid removed. Minimal drainage in drain. Will continue one more day and pull tomorrow if stable. Had AF with RVR overnight and was poorly tolerated. Now back in NSR with amio - will continue. Not candidate for long-term AC with large effusion and AVMs.   RHF much improved with milrinone. CVP now 2. Will give NS 250cc. Attempt to wean levophed. Continue ambrisentan and Tyvaso.  The patient is critically ill with multiple organ systems failure and requires high complexity decision making for assessment and support, frequent evaluation and titration of therapies, application of advanced monitoring technologies and extensive interpretation of multiple databases.   Critical Care Time devoted to patient care services described in this note is 35 Minutes.  Length of Stay: 2   Glori Bickers MD  05/04/2014, 11:02 AM Advanced Heart Failure Team Pager [phone] (M-F; [place])  Please contact [facility] for night-coverage after hours (4p -7a ) and weekends on [email or web address]

## 2014-05-04 NOTE — Progress Notes (Signed)
MEDICATION RELATED CONSULT NOTE - INITIAL   Pharmacy Consult for drug interactions with digoxin   Allergies  Allergen Reactions  . Cephalexin Rash  . Ciprofloxacin Rash  . Codeine Other (See Comments)    REACTION: GI upset  . Contrast Media [Iodinated Diagnostic Agents] Hives  . Iohexol Hives     Code: HIVES, Desc: pt breaks out in large hives. needs full premeds, Onset Date: [id number]     Patient Measurements: Height: _0  (162.6 cm) Weight: 141 lb 1.5 oz (64 kg) IBW/kg (Calculated) : 54.7   Vital Signs: Temp: 98.3 F (36.8 C) (02/20 0400) Temp Source: Oral (02/20 0400) Pulse Rate: 129 (02/20 0600) Intake/Output from previous day: 02/19 0701 - 02/20 0700 In: 1262.5 [P.O.:720; I.V.:542.5] Out: 3280 [Urine:2995; Drains:285] Intake/Output from this shift: Total I/O In: 624.1 [P.O.:360; I.V.:264.1] Out: 2150 [Urine:1995; Drains:155]  Labs:  Recent Labs  05/02/14 1022 05/03/14 0400 05/03/14 2335 05/04/14 0430  WBC 7.0  --   --  11.6*  HGB 8.7*  --   --  9.9*  HCT 26.1*  --   --  30.1*  PLT 195  --   --  232  CREATININE 2.00* 1.88* 1.73* 1.68*  ALBUMIN 3.3*  --   --   --   PROT 7.5  --  7.3  --   AST 45*  --   --   --   ALT 12  --   --   --   ALKPHOS 141*  --   --   --   BILITOT 1.0  --   --   --    Estimated Creatinine Clearance: 30 mL/min (by C-G formula based on Cr of 1.68).   Medications:  Scheduled:  . ambrisentan  10 mg Oral Daily  . digoxin  0.25 mg Intravenous Q4H  . enoxaparin (LOVENOX) injection  30 mg Subcutaneous Q24H  . FLUoxetine  20 mg Oral Daily  . gabapentin  100 mg Oral BID  . pregabalin  150 mg Oral TID  . sodium chloride  10-40 mL Intracatheter Q12H  . Treprostinil  18 mcg Inhalation QID    Assessment: There are no major drug interactions noted with current medications.  We will need to monitor K and Mag while on concurrent lasix therapy.  Plan:  Will f/u with MD digoxin dose. Level ordered for 2/21 am.  Excell Seltzer  [place] 05/04/2014,6:33 AM

## 2014-05-05 DIAGNOSIS — I480 Paroxysmal atrial fibrillation: Secondary | ICD-10-CM

## 2014-05-05 DIAGNOSIS — I319 Disease of pericardium, unspecified: Secondary | ICD-10-CM

## 2014-05-05 DIAGNOSIS — I272 Other secondary pulmonary hypertension: Secondary | ICD-10-CM

## 2014-05-05 LAB — BASIC METABOLIC PANEL
Anion gap: 8 (ref 5–15)
BUN: 40 mg/dL — ABNORMAL HIGH (ref 6–23)
CALCIUM: 9.3 mg/dL (ref 8.4–10.5)
CO2: 29 mmol/L (ref 19–32)
Chloride: 97 mmol/L (ref 96–112)
Creatinine, Ser: 1.98 mg/dL — ABNORMAL HIGH (ref 0.50–1.10)
GFR calc Af Amer: 30 mL/min — ABNORMAL LOW (ref >90)
GFR, EST NON AFRICAN AMERICAN: 26 mL/min — AB (ref >90)
GLUCOSE: 187 mg/dL — AB (ref 70–99)
POTASSIUM: 4.2 mmol/L (ref 3.5–5.1)
SODIUM: 134 mmol/L — AB (ref 135–145)

## 2014-05-05 LAB — CARBOXYHEMOGLOBIN
Carboxyhemoglobin: 1.7 % — ABNORMAL HIGH (ref 0.5–1.5)
Methemoglobin: 1.1 % (ref 0.0–1.5)
O2 SAT: 74.7 %
Total hemoglobin: 10.1 g/dL — ABNORMAL LOW (ref 12.0–16.0)

## 2014-05-05 MED ORDER — FUROSEMIDE 80 MG PO TABS
80.0000 mg | ORAL_TABLET | Freq: Two times a day (BID) | ORAL | Status: DC
  Administered 2014-05-05: 80 mg via ORAL
  Filled 2014-05-05 (×4): qty 1

## 2014-05-05 MED ORDER — AMIODARONE HCL IN DEXTROSE 360-4.14 MG/200ML-% IV SOLN: 30.0000 mg/h | INTRAVENOUS | Status: DC

## 2014-05-05 MED ORDER — ALUM & MAG HYDROXIDE-SIMETH 200-200-20 MG/5ML PO SUSP
30.0000 mL | ORAL | Status: DC | PRN
  Administered 2014-05-06 – 2014-05-08 (×2): 30 mL via ORAL
  Filled 2014-05-05 (×2): qty 30

## 2014-05-05 MED ORDER — AMIODARONE HCL IN DEXTROSE 360-4.14 MG/200ML-% IV SOLN: 60.0000 mg/h | INTRAVENOUS | Status: DC

## 2014-05-05 MED ORDER — AMIODARONE HCL IN DEXTROSE 360-4.14 MG/200ML-% IV SOLN
30.0000 mg/h | INTRAVENOUS | Status: DC
  Administered 2014-05-06: 30 mg/h via INTRAVENOUS
  Filled 2014-05-05 (×3): qty 200

## 2014-05-05 MED ORDER — ALTEPLASE 2 MG IJ SOLR
2.0000 mg | Freq: Once | INTRAMUSCULAR | Status: AC
  Administered 2014-05-05: 2 mg
  Filled 2014-05-05: qty 2

## 2014-05-05 NOTE — Progress Notes (Addendum)
Advanced Heart Failure Rounding Note   Subjective:    Underwent pericardiocentesis on 2/19 with over 1L of bloody fluid out. 45% monocytes. LDH 161  Foley placed 2/19 for urinary retention with 700cc out.   Developed AF on 2/19 converted to NSR with IV amio. Norepi down to 77mg. IV lasix stopped yesterday with CVP 2. Pericardial drain still draining 60-80cc per shift.    Objective:   Weight Range:  Vital Signs:   Temp:  [98.6 F (37 C)-99.4 F (37.4 C)] 98.6 F (37 C) (02/21 0400) Pulse Rate:  [61-85] 61 (02/21 1100) Resp:  [9-32] 11 (02/21 1100) BP: (83-133)/(38-52) 102/48 mmHg (02/21 1100) SpO2:  [95 %-100 %] 99 % (02/21 1100) Arterial Line BP: (86-134)/(38-80) 121/56 mmHg (02/21 1100) Weight:  [64.4 kg (141 lb 15.6 oz)] 64.4 kg (141 lb 15.6 oz) (02/21 0500) Last BM Date: 05/02/14  Weight change: Filed Weights   05/03/14 0800 05/04/14 0400 05/05/14 0500  Weight: 68.7 kg (151 lb 7.3 oz) 64 kg (141 lb 1.5 oz) 64.4 kg (141 lb 15.6 oz)    Intake/Output:   Intake/Output Summary (Last 24 hours) at 05/05/14 1157 Last data filed at 05/05/14 1100  Gross per 24 hour  Intake  830.6 ml  Output   1850 ml  Net -1019.4 ml     PHYSICAL EXAM: General: NAD  Neck: JVP flat, no thyromegaly or thyroid nodule.  Lungs: Crackles at bases.  CV: RV heave. Heart regular S1/S2, right-sided S3, loud p2 2/6 HSM LLSB. No peripheral edema. + drain Abdomen: Soft, nontender, no hepatosplenomegaly. Mildly distended.  Neurologic: Alert and oriented x 3.  Psych: Normal affect. Extremities: No clubbing or cyanosis. LUE PICC  Telemetry: SR. No further AF  Labs: Basic Metabolic Panel:  Recent Labs Lab 05/02/14 1022 05/03/14 0400 05/03/14 2335 05/04/14 0430 05/05/14 0436  NA 136 137  --  137 134*  K 4.8 3.4*  --  3.6 4.2  CL 104 104  --  101 97  CO2 22 29  --  28 29  GLUCOSE 116* 126* 131* 128* 187*  BUN 51* 47*  --  39* 40*  CREATININE 2.00* 1.88* 1.73* 1.68* 1.98*   CALCIUM 9.1 9.1  --  9.5 9.3    Liver Function Tests:  Recent Labs Lab 05/02/14 1022 05/03/14 2335  AST 45*  --   ALT 12  --   ALKPHOS 141*  --   BILITOT 1.0  --   PROT 7.5 7.3  ALBUMIN 3.3*  --     Recent Labs Lab 05/03/14 2335  AMYLASE 72   No results for input(s): AMMONIA in the last 168 hours.  CBC:  Recent Labs Lab 05/02/14 1022 05/04/14 0430  WBC 7.0 11.6*  HGB 8.7* 9.9*  HCT 26.1* 30.1*  MCV 79.1 79.8  PLT 195 232    Cardiac Enzymes: No results for input(s): CKTOTAL, CKMB, CKMBINDEX, TROPONINI in the last 168 hours.  BNP: BNP (last 3 results)  Recent Labs  03/15/14 0300 04/26/14 1716 05/02/14 1022  BNP 223.6* 120.9* 670.3*    ProBNP (last 3 results)  Recent Labs  06/09/13 0825 09/12/13 1121 12/26/13 1210  PROBNP 2707.0* 1481* 1194*      Other results:    Imaging: No results found.   Medications:     Scheduled Medications: . ambrisentan  10 mg Oral Daily  . enoxaparin (LOVENOX) injection  30 mg Subcutaneous Q24H  . FLUoxetine  20 mg Oral Daily  . gabapentin  100 mg Oral  BID  . pregabalin  150 mg Oral TID  . sodium chloride  10-40 mL Intracatheter Q12H  . Treprostinil  18 mcg Inhalation QID    Infusions: . amiodarone 30 mg/hr (05/05/14 1100)  . milrinone 0.25 mcg/kg/min (05/05/14 1100)  . norepinephrine (LEVOPHED) Adult infusion 1 mcg/min (05/05/14 1100)    PRN Medications: sodium chloride, acetaminophen, guaiFENesin-dextromethorphan, HYDROcodone-acetaminophen, ondansetron (ZOFRAN) IV, sodium chloride   Assessment/Plan    64 yo with history of PAH and scleroderma was admitted in 1/16 with severe PAH, right-sided heart failure and large pericardial effusion. Treatment has been limited in past by multiple med intolerances and compliance issues.  1. 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 Echo 03/2014 showed EF 55-60% with severely dilated and moderately dysfunctional RV.  2. PAH  with cor pulmonale: Severe PAH. Had recent [place] in 1/16. Continue ambrisentan and Tyvaso for now, will hold Adcirca while BP is low. Continue  milrinone 0.25 mcg/kg/min.   3. CKD III: Renal function improving on milrinone.  4. 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  5. Pericardial effusion: s/p pericardiocentesis. Cytology pending 6. AF with RVR - now in NSR on amio 7. Hypotension 8. Microcytic anemia. - check iron stores. May benefit from [facility]  She is s/p pericardiocentesis with large amount of fluid removed. Continues to drain 60-70cc per shift.  Will continue one more day and pull tomorrow if output decreased. Had AF with RVR 2/20 after pericardial drain placed and was poorly tolerated. Converted quickly back to NSR with IV amio - will continue IV amio until drain removed and then stop. Not candidate for long-term AC with large effusion and AVMs.   RHF much improved with milrinone. Lasix held yesterday. CVP now 5-6. Restart oral diuretics tomorrow am.  Attempt to wean levophed to off. Continue ambrisentan and Tyvaso. Considering Adcira as outpatient. May be worth considering switch from Tyvaso to selexapeg for compliance reasons.    Length of Stay: 3   Glori Bickers MD  05/05/2014, 11:57 AM Advanced Heart Failure Team Pager [phone] (M-F; [place])  Please contact [facility] for night-coverage after hours (4p -7a ) and weekends on [email or web address]

## 2014-05-05 NOTE — Progress Notes (Signed)
  Amiodarone Drug - Drug Interaction Consult Note  Recommendations: Interaction with diuretic.  Will monitor electrolytes and potassium levels closely while on lasix and amiodarone.  Amiodarone is metabolized by the cytochrome P450 system and therefore has the potential to cause many drug interactions. Amiodarone has an average plasma half-life of 50 days (range 20 to 100 days).   There is potential for drug interactions to occur several weeks or months after stopping treatment and the onset of drug interactions may be slow after initiating amiodarone.   _0  Statins: Increased risk of myopathy. Simvastatin- restrict dose to 77m daily. Other statins: counsel patients to report any muscle pain or weakness immediately.  _1  Anticoagulants: Amiodarone can increase anticoagulant effect. Consider warfarin dose reduction. Patients should be monitored closely and the dose of anticoagulant altered accordingly, remembering that amiodarone levels take several weeks to stabilize.  _2  Antiepileptics: Amiodarone can increase plasma concentration of phenytoin, the dose should be reduced. Note that small changes in phenytoin dose can result in large changes in levels. Monitor patient and counsel on signs of toxicity.  _3  Beta blockers: increased risk of bradycardia, AV block and myocardial depression. Sotalol - avoid concomitant use.  _4   Calcium channel blockers (diltiazem and verapamil): increased risk of bradycardia, AV block and myocardial depression.  _5   Cyclosporine: Amiodarone increases levels of cyclosporine. Reduced dose of cyclosporine is recommended.  _6  Digoxin dose should be halved when amiodarone is started.  _7  Diuretics: increased risk of cardiotoxicity if hypokalemia occurs.  _8  Oral hypoglycemic agents (glyburide, glipizide, glimepiride): increased risk of hypoglycemia. [name] glucose levels should be monitored closely when initiating amiodarone therapy.   _9  Drugs that prolong the  QT interval:  Torsades de pointes risk may be increased with concurrent use - avoid if possible.  Monitor QTc, also keep magnesium/potassium WNL if concurrent therapy can't be avoided. .Marland KitchenAntibiotics: e.g. fluoroquinolones, erythromycin. . Antiarrhythmics: e.g. quinidine, procainamide, disopyramide, sotalol. . Antipsychotics: e.g. phenothiazines, haloperidol.  . Lithium, tricyclic antidepressants, and methadone.  Thank You,  Onesimo Lingard L. SNicole Kindred PharmD Clinical Pharmacy Resident Pager: [phone]/21/2016 12:14 PM

## 2014-05-06 LAB — BASIC METABOLIC PANEL
Anion gap: 10 (ref 5–15)
BUN: 38 mg/dL — AB (ref 6–23)
CALCIUM: 9.2 mg/dL (ref 8.4–10.5)
CO2: 28 mmol/L (ref 19–32)
Chloride: 95 mmol/L — ABNORMAL LOW (ref 96–112)
Creatinine, Ser: 1.7 mg/dL — ABNORMAL HIGH (ref 0.50–1.10)
GFR calc Af Amer: 36 mL/min — ABNORMAL LOW (ref >90)
GFR calc non Af Amer: 31 mL/min — ABNORMAL LOW (ref >90)
GLUCOSE: 202 mg/dL — AB (ref 70–99)
POTASSIUM: 3.8 mmol/L (ref 3.5–5.1)
Sodium: 133 mmol/L — ABNORMAL LOW (ref 135–145)

## 2014-05-06 LAB — CARBOXYHEMOGLOBIN
CARBOXYHEMOGLOBIN: 1.6 % — AB (ref 0.5–1.5)
Methemoglobin: 1.2 % (ref 0.0–1.5)
O2 Saturation: 77.2 %
Total hemoglobin: 9.3 g/dL — ABNORMAL LOW (ref 12.0–16.0)

## 2014-05-06 LAB — CBC
HCT: 28.6 % — ABNORMAL LOW (ref 36.0–46.0)
HEMOGLOBIN: 9.3 g/dL — AB (ref 12.0–15.0)
MCH: 25.8 pg — ABNORMAL LOW (ref 26.0–34.0)
MCHC: 32.5 g/dL (ref 30.0–36.0)
MCV: 79.4 fL (ref 78.0–100.0)
Platelets: 196 10*3/uL (ref 150–400)
RBC: 3.6 MIL/uL — ABNORMAL LOW (ref 3.87–5.11)
RDW: 15.9 % — ABNORMAL HIGH (ref 11.5–15.5)
WBC: 6.6 10*3/uL (ref 4.0–10.5)

## 2014-05-06 MED ORDER — TADALAFIL 20 MG PO TABS
20.0000 mg | ORAL_TABLET | Freq: Every day | ORAL | Status: DC
  Administered 2014-05-06 – 2014-05-09 (×4): 20 mg via ORAL
  Filled 2014-05-06 (×5): qty 1

## 2014-05-06 MED ORDER — AMIODARONE HCL 200 MG PO TABS
200.0000 mg | ORAL_TABLET | Freq: Two times a day (BID) | ORAL | Status: DC
  Administered 2014-05-06 – 2014-05-09 (×7): 200 mg via ORAL
  Filled 2014-05-06 (×8): qty 1

## 2014-05-06 MED ORDER — SODIUM CHLORIDE 0.9 % IV SOLN
510.0000 mg | Freq: Every day | INTRAVENOUS | Status: AC
  Administered 2014-05-06 – 2014-05-07 (×2): 510 mg via INTRAVENOUS
  Filled 2014-05-06 (×4): qty 17

## 2014-05-06 NOTE — Progress Notes (Signed)
05/06/2014 10:05 AM   BP reported to Dr. Aundra Dubin. OK to leave off Levo as long as BP>=85 syst.

## 2014-05-06 NOTE — Progress Notes (Signed)
Advanced Heart Failure Rounding Note   Subjective:    Underwent pericardiocentesis on 2/19 with over 1L of bloody fluid out. 45% monocytes. LDH 161  Foley placed 2/19 for urinary retention with 700cc out. Developed AF on 2/19 converted to NSR with IV amio.Pericardial drain 40cc over night.    Denies dyspnea.    Objective:   Weight Range:  Vital Signs:   Temp:  [97.6 F (36.4 C)-98.5 F (36.9 C)] 97.6 F (36.4 C) (02/22 0400) Pulse Rate:  [61-73] 64 (02/22 0400) Resp:  [10-15] 12 (02/22 0400) BP: (81-114)/(38-57) 93/48 mmHg (02/22 0400) SpO2:  [96 %-100 %] 100 % (02/22 0400) Arterial Line BP: (98-134)/(43-80) 119/61 mmHg (02/21 1400) Weight:  [142 lb 10.2 oz (64.7 kg)] 142 lb 10.2 oz (64.7 kg) (02/22 0412) Last BM Date: 05/02/14  Weight change: Filed Weights   05/04/14 0400 05/05/14 0500 05/06/14 0412  Weight: 141 lb 1.5 oz (64 kg) 141 lb 15.6 oz (64.4 kg) 142 lb 10.2 oz (64.7 kg)    Intake/Output:   Intake/Output Summary (Last 24 hours) at 05/06/14 0709 Last data filed at 05/06/14 0400  Gross per 24 hour  Intake  523.1 ml  Output    965 ml  Net -441.9 ml     PHYSICAL EXAM: CVP 4-5  General: NAD  Neck: JVP flat, no thyromegaly or thyroid nodule.  Lungs: Crackles at bases.  CV: RV heave. Heart regular S1/S2, right-sided S3, loud p2 2/6 HSM LLSB. No peripheral edema. + drain Abdomen: Soft, nontender, no hepatosplenomegaly. Mildly distended.  Neurologic: Alert and oriented x 3.  Psych: Normal affect. Extremities: No clubbing or cyanosis. LUE PICC  Telemetry: SR 60s  Labs: Basic Metabolic Panel:  Recent Labs Lab 05/02/14 1022 05/03/14 0400 05/03/14 2335 05/04/14 0430 05/05/14 0436 05/06/14 0500  NA 136 137  --  137 134* 133*  K 4.8 3.4*  --  3.6 4.2 3.8  CL 104 104  --  101 97 95*  CO2 22 29  --  _0 GLUCOSE 116* 126* 131* 128* 187* 202*  BUN 51* 47*  --  39* 40* 38*  CREATININE 2.00* 1.88* 1.73* 1.68* 1.98* 1.70*  CALCIUM 9.1 9.1   --  9.5 9.3 9.2    Liver Function Tests:  Recent Labs Lab 05/02/14 1022 05/03/14 2335  AST 45*  --   ALT 12  --   ALKPHOS 141*  --   BILITOT 1.0  --   PROT 7.5 7.3  ALBUMIN 3.3*  --     Recent Labs Lab 05/03/14 2335  AMYLASE 72   No results for input(s): AMMONIA in the last 168 hours.  CBC:  Recent Labs Lab 05/02/14 1022 05/04/14 0430 05/06/14 0500  WBC 7.0 11.6* 6.6  HGB 8.7* 9.9* 9.3*  HCT 26.1* 30.1* 28.6*  MCV 79.1 79.8 79.4  PLT 195 232 196    Cardiac Enzymes: No results for input(s): CKTOTAL, CKMB, CKMBINDEX, TROPONINI in the last 168 hours.  BNP: BNP (last 3 results)  Recent Labs  03/15/14 0300 04/26/14 1716 05/02/14 1022  BNP 223.6* 120.9* 670.3*    ProBNP (last 3 results)  Recent Labs  06/09/13 0825 09/12/13 1121 12/26/13 1210  PROBNP 2707.0* 1481* 1194*      Other results:    Imaging: No results found.   Medications:     Scheduled Medications: . ambrisentan  10 mg Oral Daily  . enoxaparin (LOVENOX) injection  30 mg Subcutaneous Q24H  . FLUoxetine  20 mg Oral  Daily  . furosemide  80 mg Oral BID  . gabapentin  100 mg Oral BID  . pregabalin  150 mg Oral TID  . sodium chloride  10-40 mL Intracatheter Q12H  . Treprostinil  18 mcg Inhalation QID    Infusions: . amiodarone 30 mg/hr (05/05/14 2000)  . milrinone 0.25 mcg/kg/min (05/05/14 2000)  . norepinephrine (LEVOPHED) Adult infusion Stopped (05/05/14 1203)    PRN Medications: sodium chloride, acetaminophen, alum & mag hydroxide-simeth, guaiFENesin-dextromethorphan, HYDROcodone-acetaminophen, ondansetron (ZOFRAN) IV, sodium chloride   Assessment/Plan    64 yo with history of PAH and scleroderma was admitted in 1/16 with severe PAH, right-sided heart failure and large pericardial effusion. Treatment has been limited in past by multiple med intolerances and compliance issues.  1. 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 Echo 03/2014  showed EF 55-60% with severely dilated and moderately dysfunctional RV. Volume status stable. CVP 4-5 Hold diuretics today.  2. PAH with cor pulmonale: Severe PAH. Had recent [place] in 1/16. Continue ambrisentan and Tyvaso for now, off Adcirca due to hypotension.  Continue milrinone 0.25 mcg/kg/min.   3. CKD III: Renal function improving on milrinone.  4. 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  5. Pericardial effusion: s/p pericardiocentesis. Cytology pending 6. AF with RVR - now in NSR on amio. Not a candidate for long term anticoagulants due to large effusion and AVMs.  7. Hypotension 8. Microcytic anemia. - Iron stores low. Give Feraheme daily for the next 2 days.  9. Pericardial Effusion. S/P Pericardiocentesis 05/03/14.   10. Immobility- Consult PT.   Length of Stay: 4   CLEGG,AMY NP-C  05/06/2014, 7:09 AM Advanced Heart Failure Team Pager [phone] (M-F; 7a - 4p)  Please contact [facility] for night-coverage after hours (4p -7a ) and weekends on [email or web address]  Patient seen with NP, agree with the above note.  CVP now 4-5, doing well.  Feels better after pericardiocentesis.  Still with a fair amount of drainage from pericardial drain, will leave in place today likely remove tomorrow.  Hold diuretics today.  Remains in NSR, transition amiodarone to po.  As above, poor candidate for long-term anticoagulation.  Will decrease milrinone to 0.125 mcg/kg/min today and restart tadalafil at 20 mg daily.  Repeat co-ox in morning.  Mobilize with PT.   [name] 05/06/2014 8:02 AM

## 2014-05-06 NOTE — Progress Notes (Signed)
Patient attempted to void a second time with no success. Bladder scanned again, showed less than 234m of urine in bladder. Pt continues to deny pressure or urge to urinate.

## 2014-05-06 NOTE — Progress Notes (Signed)
CARDIAC REHAB PHASE I   PRE:  Rate/Rhythm: 61 SR    BP: sitting 78/44 (x2)    SaO2: 99 3L  MODE:  Ambulation: 420 ft   POST:  Rate/Rhythm: 66 SR    BP: sitting 91/43     SaO2: 100 2L  Pt sts she doesn't like to walk but she will. Actually did quite well while walking, quick pace, long distance, steady, denied dizziness or SOB despite low BP in recliner. Did c/o of dizziness and SOB and fatigue after sitting. VSS.  Will continue to follow. [place], [place], ACSM 05/06/2014 9:43 AM

## 2014-05-06 NOTE — Progress Notes (Signed)
Pt due to void post foley catheter removal. Pt attempted to void with no success. Pt denies feeling pressure or urge to urinate. Stated that she sometimes has trouble voiding at home. Bladder scan completed showed less than 169m of urine. Will continue to monitor pt.

## 2014-05-06 NOTE — Progress Notes (Signed)
Offered patient opportunity to void. Pt denies need or urge to void. Bladder scan completed. Showed greater than 492m of urine. In and out cath performed. 4794mvoided. Will continue to monitor patient.

## 2014-05-06 NOTE — Evaluation (Signed)
Physical Therapy Evaluation Patient Details Name: [name] MRN: [id number] DOB: 09/12/1950 Today's Date: 05/06/2014   History of Present Illness  64 yo with history of PAH and scleroderma was admitted with right-sided heart failure and large pericardial effusion. PMH - DM2, HLD, HTN, pHTN, GERD, gastroparesis, scleroderma, DJD and diastolic HF. Pt had GI bleed on 03/22/13.  Clinical Impression  Patient demonstrates deficits in functional mobility as indicated below. Will benefit from continued skilled PT to address deficits and maximize function. Will see as indicated and progress as tolerated. OF NOTE: Soft BP,  SaO2 89% on room air with activity, HR upper 60s    Follow Up Recommendations No PT follow up    Equipment Recommendations  None recommended by PT    Recommendations for Other Services       Precautions / Restrictions Precautions Precautions: None Restrictions Weight Bearing Restrictions: No      Mobility  Bed Mobility               General bed mobility comments: received in chair  Transfers Overall transfer level: Needs assistance Equipment used: None Transfers: Sit to/from Stand Sit to Stand: Supervision         General transfer comment: Supervision for line management  Ambulation/Gait Ambulation/Gait assistance: Supervision Ambulation Distance (Feet): 160 Feet Assistive device: None Gait Pattern/deviations: Step-through pattern;Decreased stride length;Narrow base of support Gait velocity: decreased   General Gait Details: some modest instability noted with ambulation  Stairs            Wheelchair Mobility    Modified Rankin (Stroke Patients Only)       Balance     Sitting balance-Leahy Scale: Normal       Standing balance-Leahy Scale: Good                               Pertinent Vitals/Pain      Home Living Family/patient expects to be discharged to:: Private residence Living Arrangements:  Non-relatives/Friends Available Help at Discharge: Family;Available PRN/intermittently Type of Home: House Home Access: Stairs to enter Entrance Stairs-Rails: None Entrance Stairs-Number of Steps: 2 Home Layout: One level Home Equipment: None Additional Comments: family support is limited. possibly able to provide 24 hr supervision for 2 days    Prior Function Level of Independence: Independent               Hand Dominance        Extremity/Trunk Assessment   Upper Extremity Assessment: Overall WFL for tasks assessed           Lower Extremity Assessment: Generalized weakness         Communication   Communication: No difficulties  Cognition Arousal/Alertness: Awake/alert Behavior During Therapy: Flat affect Overall Cognitive Status: Within Functional Limits for tasks assessed                      General Comments      Exercises        Assessment/Plan    PT Assessment Patient needs continued PT services  PT Diagnosis Difficulty walking;Generalized weakness   PT Problem List Decreased strength;Decreased activity tolerance;Decreased balance;Decreased mobility  PT Treatment Interventions DME instruction;Gait training;Functional mobility training;Therapeutic activities;Therapeutic exercise;Patient/family education;Balance training   PT Goals (Current goals can be found in the Care Plan section) Acute Rehab PT Goals Patient Stated Goal: return home PT Goal Formulation: With patient Time For Goal Achievement: 05/20/14 Potential to Achieve  Goals: Good    Frequency Min 3X/week   Barriers to discharge Decreased caregiver support      Co-evaluation               End of Session Equipment Utilized During Treatment: Oxygen Activity Tolerance: Patient tolerated treatment well Patient left: in chair;with call bell/phone within reach Nurse Communication: Mobility status         Time: 3128-1188 PT Time Calculation (min) (ACUTE ONLY): 19  min   Charges:   PT Evaluation $Initial PT Evaluation Tier I: 1 Procedure     PT G CodesDuncan Dull 06-05-2014, 2:32 PM Alben Deeds, [place] DPT  [phone]

## 2014-05-07 LAB — BODY FLUID CULTURE: Culture: NO GROWTH

## 2014-05-07 LAB — CARBOXYHEMOGLOBIN
Carboxyhemoglobin: 1.3 % (ref 0.5–1.5)
Methemoglobin: 1.3 % (ref 0.0–1.5)
O2 SAT: 73.1 %
Total hemoglobin: 9.6 g/dL — ABNORMAL LOW (ref 12.0–16.0)

## 2014-05-07 LAB — BASIC METABOLIC PANEL
Anion gap: 5 (ref 5–15)
BUN: 37 mg/dL — ABNORMAL HIGH (ref 6–23)
CALCIUM: 9.2 mg/dL (ref 8.4–10.5)
CHLORIDE: 101 mmol/L (ref 96–112)
CO2: 29 mmol/L (ref 19–32)
Creatinine, Ser: 1.53 mg/dL — ABNORMAL HIGH (ref 0.50–1.10)
GFR calc Af Amer: 41 mL/min — ABNORMAL LOW (ref >90)
GFR, EST NON AFRICAN AMERICAN: 35 mL/min — AB (ref >90)
Glucose, Bld: 102 mg/dL — ABNORMAL HIGH (ref 70–99)
POTASSIUM: 4.5 mmol/L (ref 3.5–5.1)
Sodium: 135 mmol/L (ref 135–145)

## 2014-05-07 LAB — CBC
HCT: 28.6 % — ABNORMAL LOW (ref 36.0–46.0)
Hemoglobin: 9.1 g/dL — ABNORMAL LOW (ref 12.0–15.0)
MCH: 26 pg (ref 26.0–34.0)
MCHC: 31.8 g/dL (ref 30.0–36.0)
MCV: 81.7 fL (ref 78.0–100.0)
PLATELETS: 185 10*3/uL (ref 150–400)
RBC: 3.5 MIL/uL — ABNORMAL LOW (ref 3.87–5.11)
RDW: 15.9 % — AB (ref 11.5–15.5)
WBC: 5.8 10*3/uL (ref 4.0–10.5)

## 2014-05-07 MED ORDER — FERROUS SULFATE 325 (65 FE) MG PO TABS
325.0000 mg | ORAL_TABLET | Freq: Two times a day (BID) | ORAL | Status: DC
  Administered 2014-05-07 – 2014-05-09 (×4): 325 mg via ORAL
  Filled 2014-05-07 (×7): qty 1

## 2014-05-07 MED ORDER — FUROSEMIDE 80 MG PO TABS
80.0000 mg | ORAL_TABLET | Freq: Two times a day (BID) | ORAL | Status: DC
  Administered 2014-05-07 (×2): 80 mg via ORAL
  Filled 2014-05-07 (×5): qty 1

## 2014-05-07 NOTE — Progress Notes (Signed)
Physical Therapy Treatment Patient Details Name: [name] MRN: [id number] DOB: [birth year] Today's Date: 05/07/2014    History of Present Illness      PT Comments    Pt with low BP, 95/39, resulting in mild dizziness with gait.  Pt ambulated on RA with sats ranging from 91-100%.  Follow Up Recommendations  No PT follow up     Equipment Recommendations  None recommended by PT    Recommendations for Other Services       Precautions / Restrictions Precautions Precautions: None Restrictions Weight Bearing Restrictions: No    Mobility  Bed Mobility           Sit to supine: Supervision   General bed mobility comments: supervision for safety, no physical assist  Transfers   Equipment used: None Transfers: Stand Pivot Transfers Sit to Stand: Min guard Stand pivot transfers: Min guard       General transfer comment: min guard due to lightheaded/dizzy. Pt with low BP, 95/39. Assist with line management.  Ambulation/Gait Ambulation/Gait assistance: Min guard Ambulation Distance (Feet): 60 Feet (x 2) Assistive device: None Gait Pattern/deviations: Step-through pattern;Decreased stride length;Narrow base of support Gait velocity: decreased   General Gait Details: some modest instability noted with ambulation, Pt with c/o mild dizziness. Pt ambulated on RA with O2 sats remaining in the 90s.   Stairs            Wheelchair Mobility    Modified Rankin (Stroke Patients Only)       Balance     Sitting balance-Leahy Scale: Normal       Standing balance-Leahy Scale: Good                      Cognition Arousal/Alertness: Awake/alert Behavior During Therapy: Flat affect Overall Cognitive Status: Within Functional Limits for tasks assessed                      Exercises      General Comments        Pertinent Vitals/Pain Pain Assessment: No/denies pain    Home Living                      Prior Function             PT Goals (current goals can now be found in the care plan section) Progress towards PT goals: Progressing toward goals    Frequency  Min 3X/week    PT Plan Current plan remains appropriate    Co-evaluation             End of Session Equipment Utilized During Treatment: Gait belt;Oxygen Activity Tolerance: Patient tolerated treatment well Patient left: in chair;with call bell/phone within reach     Time: 0925-0949 PT Time Calculation (min) (ACUTE ONLY): 24 min  Charges:  $Gait Training: 23-37 mins                    G Codes:      [name] 05/07/2014, 10:03 AM

## 2014-05-07 NOTE — Progress Notes (Signed)
CARDIAC REHAB PHASE I   PRE:  Rate/Rhythm: 59 SB    BP: sitting 93/47    SaO2: 99 3/L  MODE:  Ambulation: 420 ft   POST:  Rate/Rhythm: 69 SR    BP: sitting 97/78     SaO2: 100 2L  Pt again sts she doesn't like to walk but wants to get it over with! Does well once up, no c/o, steady with RW, prob doesn't need RW. VSS. Return to recliner, joking with me and staff. Will f/u. [phone]   Josephina Shih [place] CES, ACSM 05/07/2014 11:10 AM

## 2014-05-07 NOTE — Progress Notes (Addendum)
Patient ID: [name], female   DOB: 1950/08/11, 64 y.o.   MRN: [id number] Advanced Heart Failure Rounding Note   Subjective:    Underwent pericardiocentesis on 2/19 with over 1L of bloody fluid out. No malignant cells.   Foley placed 2/19 for urinary retention with 700cc out. Developed AF on 2/19 converted to NSR with IV amio.Pericardial drain 35 cc last 24 hrs.    Denies dyspnea. Doing well with milrinone taper.  CVP 8 cm, did not get diuretics yesterday.    Objective:   Weight Range:  Vital Signs:   Temp:  [97.4 F (36.3 C)-98.7 F (37.1 C)] 98.4 F (36.9 C) (02/23 0400) Pulse Rate:  [55-64] 56 (02/23 0500) Resp:  [10-20] 13 (02/23 0500) BP: (73-118)/(34-69) 81/41 mmHg (02/23 0500) SpO2:  [95 %-100 %] 99 % (02/23 0500) Weight:  [146 lb 6.2 oz (66.4 kg)] 146 lb 6.2 oz (66.4 kg) (02/23 0500) Last BM Date: 05/06/14  Weight change: Filed Weights   05/05/14 0500 05/06/14 0412 05/07/14 0500  Weight: 141 lb 15.6 oz (64.4 kg) 142 lb 10.2 oz (64.7 kg) 146 lb 6.2 oz (66.4 kg)    Intake/Output:   Intake/Output Summary (Last 24 hours) at 05/07/14 0714 Last data filed at 05/07/14 0600  Gross per 24 hour  Intake  920.3 ml  Output   1585 ml  Net -664.7 ml     PHYSICAL EXAM: CVP 8  General: NAD  Neck: JVP 8-9 cm, no thyromegaly or thyroid nodule.  Lungs: Crackles at bases.  CV: RV heave. Heart regular S1/S2, right-sided S3, loud p2 2/6 HSM LLSB. No peripheral edema. + drain Abdomen: Soft, nontender, no hepatosplenomegaly. Mildly distended.  Neurologic: Alert and oriented x 3.  Psych: Normal affect. Extremities: No clubbing or cyanosis. LUE PICC  Telemetry: SR 60s  Labs: Basic Metabolic Panel:  Recent Labs Lab 05/03/14 0400 05/03/14 2335 05/04/14 0430 05/05/14 0436 05/06/14 0500 05/07/14 0345  NA 137  --  137 134* 133* 135  K 3.4*  --  3.6 4.2 3.8 4.5  CL 104  --  101 97 95* 101  CO2 29  --  _0 GLUCOSE 126* 131* 128* 187* 202* 102*  BUN  47*  --  39* 40* 38* 37*  CREATININE 1.88* 1.73* 1.68* 1.98* 1.70* 1.53*  CALCIUM 9.1  --  9.5 9.3 9.2 9.2    Liver Function Tests:  Recent Labs Lab 05/02/14 1022 05/03/14 2335  AST 45*  --   ALT 12  --   ALKPHOS 141*  --   BILITOT 1.0  --   PROT 7.5 7.3  ALBUMIN 3.3*  --     Recent Labs Lab 05/03/14 2335  AMYLASE 72   No results for input(s): AMMONIA in the last 168 hours.  CBC:  Recent Labs Lab 05/02/14 1022 05/04/14 0430 05/06/14 0500 05/07/14 0345  WBC 7.0 11.6* 6.6 5.8  HGB 8.7* 9.9* 9.3* 9.1*  HCT 26.1* 30.1* 28.6* 28.6*  MCV 79.1 79.8 79.4 81.7  PLT 195 232 196 185    Cardiac Enzymes: No results for input(s): CKTOTAL, CKMB, CKMBINDEX, TROPONINI in the last 168 hours.  BNP: BNP (last 3 results)  Recent Labs  03/15/14 0300 04/26/14 1716 05/02/14 1022  BNP 223.6* 120.9* 670.3*    ProBNP (last 3 results)  Recent Labs  06/09/13 0825 09/12/13 1121 12/26/13 1210  PROBNP 2707.0* 1481* 1194*      Other results:    Imaging: No results found.  Medications:     Scheduled Medications: . ambrisentan  10 mg Oral Daily  . amiodarone  200 mg Oral BID  . enoxaparin (LOVENOX) injection  30 mg Subcutaneous Q24H  . ferumoxytol  510 mg Intravenous Daily  . FLUoxetine  20 mg Oral Daily  . furosemide  80 mg Oral BID  . gabapentin  100 mg Oral BID  . pregabalin  150 mg Oral TID  . sodium chloride  10-40 mL Intracatheter Q12H  . tadalafil  20 mg Oral Daily  . Treprostinil  18 mcg Inhalation QID    Infusions: . norepinephrine (LEVOPHED) Adult infusion Stopped (05/05/14 1203)    PRN Medications: sodium chloride, acetaminophen, alum & mag hydroxide-simeth, guaiFENesin-dextromethorphan, HYDROcodone-acetaminophen, ondansetron (ZOFRAN) IV, sodium chloride   Assessment/Plan    64 yo with history of PAH and scleroderma was admitted in 1/16 with severe PAH, right-sided heart failure and large pericardial effusion. Treatment has been limited  in past by multiple med intolerances and compliance issues.  1. 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 Echo 03/2014 showed EF 55-60% with severely dilated and moderately dysfunctional RV. Volume status stable. Now s/p pericardiocentesis with large pericardial effusion and signs of tamponade.  CVP 8. Will restart Lasix 80 mg bid today. 2. PAH with cor pulmonale: Severe PAH. Had recent [place] in 1/16. Continue ambrisentan and Tyvaso, tadalafil restarted at 20 mg.  She tolerated milrinone taper yesterday, stop today.  Good co-ox today at 73%, repeat tomorrow.  3. CKD III: Renal function stable.  4. 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  5. Pericardial effusion: s/p pericardiocentesis for large effusion with signs of tamponade. No malignant cells.  35 cc out of drain yesterday, will pull today.  6. AF with RVR: now in NSR on amio. Not a candidate for long term anticoagulants due to large effusion and AVMs.  7. Hypotension 8. Microcytic anemia: Iron stores low. Give Feraheme daily for the next 2 days.    45 minutes critical care time including pericardial drain pull.   Length of Stay: [place]  05/07/2014, 7:14 AM Advanced Heart Failure Team Pager [phone] (M-F; 7a - 4p)  Please contact [facility] for night-coverage after hours (4p -7a ) and weekends on [email or web address]

## 2014-05-08 LAB — BASIC METABOLIC PANEL
ANION GAP: 5 (ref 5–15)
BUN: 36 mg/dL — ABNORMAL HIGH (ref 6–23)
CO2: 28 mmol/L (ref 19–32)
Calcium: 9 mg/dL (ref 8.4–10.5)
Chloride: 101 mmol/L (ref 96–112)
Creatinine, Ser: 1.55 mg/dL — ABNORMAL HIGH (ref 0.50–1.10)
GFR calc non Af Amer: 35 mL/min — ABNORMAL LOW (ref >90)
GFR, EST AFRICAN AMERICAN: 40 mL/min — AB (ref >90)
Glucose, Bld: 87 mg/dL (ref 70–99)
POTASSIUM: 4.4 mmol/L (ref 3.5–5.1)
Sodium: 134 mmol/L — ABNORMAL LOW (ref 135–145)

## 2014-05-08 LAB — CBC
HCT: 27.2 % — ABNORMAL LOW (ref 36.0–46.0)
Hemoglobin: 8.8 g/dL — ABNORMAL LOW (ref 12.0–15.0)
MCH: 25.9 pg — ABNORMAL LOW (ref 26.0–34.0)
MCHC: 32.4 g/dL (ref 30.0–36.0)
MCV: 80 fL (ref 78.0–100.0)
PLATELETS: 184 10*3/uL (ref 150–400)
RBC: 3.4 MIL/uL — ABNORMAL LOW (ref 3.87–5.11)
RDW: 15.6 % — AB (ref 11.5–15.5)
WBC: 6.1 10*3/uL (ref 4.0–10.5)

## 2014-05-08 LAB — CARBOXYHEMOGLOBIN
Carboxyhemoglobin: 2 % — ABNORMAL HIGH (ref 0.5–1.5)
Methemoglobin: 1.3 % (ref 0.0–1.5)
O2 Saturation: 74.9 %
Total hemoglobin: 9.1 g/dL — ABNORMAL LOW (ref 12.0–16.0)

## 2014-05-08 MED ORDER — FUROSEMIDE 80 MG PO TABS
80.0000 mg | ORAL_TABLET | Freq: Two times a day (BID) | ORAL | Status: DC
  Administered 2014-05-08 – 2014-05-09 (×2): 80 mg via ORAL
  Filled 2014-05-08 (×4): qty 1

## 2014-05-08 MED ORDER — FUROSEMIDE 10 MG/ML IJ SOLN
80.0000 mg | Freq: Once | INTRAMUSCULAR | Status: AC
  Administered 2014-05-08: 80 mg via INTRAVENOUS
  Filled 2014-05-08: qty 8

## 2014-05-08 MED ORDER — SIMETHICONE 80 MG PO CHEW
80.0000 mg | CHEWABLE_TABLET | Freq: Four times a day (QID) | ORAL | Status: DC | PRN
  Administered 2014-05-08: 80 mg via ORAL
  Filled 2014-05-08 (×2): qty 1

## 2014-05-08 NOTE — Progress Notes (Signed)
CARDIAC REHAB PHASE I   PRE:  Rate/Rhythm: 69-70 SR  BP:  Supine:   Sitting: 80/37  Standing:    SaO2: 97%RA  MODE:  Ambulation: 350 ft   POST:  Rate/Rhythm: 71 SR  BP:  Supine:   Sitting: 99/34  Standing:    SaO2: 96%RA 1420-1450 Pt in good spirits today. Walked 350 ft on RA with asst x1 with fairly steady gait. Loss of balance once when waving to Dr. Mahalia Longest. Would probably be steadier with walker but wanted to go without. Tolerated well. To BSC and then to recliner. Urine with foul smell. Sats good on RA.   Graylon Good, RN BSN  05/08/2014 2:46 PM

## 2014-05-08 NOTE — Progress Notes (Signed)
Patient ID: [name], female   DOB: 1951/01/31, 64 y.o.   MRN: [id number] Advanced Heart Failure Rounding Note   Subjective:    Underwent pericardiocentesis on 2/19 with over 1L of bloody fluid out. No malignant cells.   Foley placed 2/19 for urinary retention with 700cc out. Developed AF on 2/19 converted to NSR with IV amio.Pericardial drain removed yesterday.  Weight trending up.   Denies dyspnea.  Objective:   Weight Range:  Vital Signs:   Temp:  [97.8 F (36.6 C)-98.3 F (36.8 C)] 98.1 F (36.7 C) (02/24 0400) Pulse Rate:  [53-75] 59 (02/24 0630) Resp:  [12-23] 14 (02/24 0630) BP: (74-111)/(26-78) 92/42 mmHg (02/24 0630) SpO2:  [93 %-100 %] 100 % (02/24 0630) Weight:  [148 lb 9.4 oz (67.4 kg)] 148 lb 9.4 oz (67.4 kg) (02/24 0246) Last BM Date: 05/06/14  Weight change: Filed Weights   05/06/14 0412 05/07/14 0500 05/08/14 0246  Weight: 142 lb 10.2 oz (64.7 kg) 146 lb 6.2 oz (66.4 kg) 148 lb 9.4 oz (67.4 kg)    Intake/Output:   Intake/Output Summary (Last 24 hours) at 05/08/14 7048 Last data filed at 05/08/14 0600  Gross per 24 hour  Intake  485.2 ml  Output   2350 ml  Net -1864.8 ml     PHYSICAL EXAM: CVP 13-14 General: NAD  Neck: JVP to jaw cm, no thyromegaly or thyroid nodule.  Lungs: Crackles at bases.  CV: RV heave. Heart regular S1/S2, right-sided S3, loud p2 2/6 HSM LLSB. No peripheral edema. + drain Abdomen: Soft, nontender, no hepatosplenomegaly. Mildly distended.  Neurologic: Alert and oriented x 3.  Psych: Normal affect. Extremities: No clubbing or cyanosis. LUE PICC  Telemetry: SR 60s  Labs: Basic Metabolic Panel:  Recent Labs Lab 05/04/14 0430 05/05/14 0436 05/06/14 0500 05/07/14 0345 05/08/14 0300  NA 137 134* 133* 135 134*  K 3.6 4.2 3.8 4.5 4.4  CL 101 97 95* 101 101  CO2 _0 GLUCOSE 128* 187* 202* 102* 87  BUN 39* 40* 38* 37* 36*  CREATININE 1.68* 1.98* 1.70* 1.53* 1.55*  CALCIUM 9.5 9.3 9.2 9.2 9.0     Liver Function Tests:  Recent Labs Lab 05/02/14 1022 05/03/14 2335  AST 45*  --   ALT 12  --   ALKPHOS 141*  --   BILITOT 1.0  --   PROT 7.5 7.3  ALBUMIN 3.3*  --     Recent Labs Lab 05/03/14 2335  AMYLASE 72   No results for input(s): AMMONIA in the last 168 hours.  CBC:  Recent Labs Lab 05/02/14 1022 05/04/14 0430 05/06/14 0500 05/07/14 0345 05/08/14 0300  WBC 7.0 11.6* 6.6 5.8 6.1  HGB 8.7* 9.9* 9.3* 9.1* 8.8*  HCT 26.1* 30.1* 28.6* 28.6* 27.2*  MCV 79.1 79.8 79.4 81.7 80.0  PLT 195 232 196 185 184    Cardiac Enzymes: No results for input(s): CKTOTAL, CKMB, CKMBINDEX, TROPONINI in the last 168 hours.  BNP: BNP (last 3 results)  Recent Labs  03/15/14 0300 04/26/14 1716 05/02/14 1022  BNP 223.6* 120.9* 670.3*    ProBNP (last 3 results)  Recent Labs  06/09/13 0825 09/12/13 1121 12/26/13 1210  PROBNP 2707.0* 1481* 1194*      Other results:    Imaging: No results found.   Medications:     Scheduled Medications: . ambrisentan  10 mg Oral Daily  . amiodarone  200 mg Oral BID  . enoxaparin (LOVENOX) injection  30 mg Subcutaneous  Q24H  . ferrous sulfate  325 mg Oral BID WC  . FLUoxetine  20 mg Oral Daily  . furosemide  80 mg Oral BID  . gabapentin  100 mg Oral BID  . pregabalin  150 mg Oral TID  . sodium chloride  10-40 mL Intracatheter Q12H  . tadalafil  20 mg Oral Daily  . Treprostinil  18 mcg Inhalation QID    Infusions: . norepinephrine (LEVOPHED) Adult infusion Stopped (05/05/14 1203)    PRN Medications: sodium chloride, acetaminophen, alum & mag hydroxide-simeth, guaiFENesin-dextromethorphan, HYDROcodone-acetaminophen, ondansetron (ZOFRAN) IV, sodium chloride   Assessment/Plan    64 yo with history of PAH and scleroderma was admitted in 1/16 with severe PAH, right-sided heart failure and large pericardial effusion. Treatment has been limited in past by multiple med intolerances and compliance issues.  1. Right  heart failure: Due to severe PAH in the setting of scleroderma. Echo 03/2014 showed EF 55-60% with severely dilated and moderately dysfunctional RV. Volume status stable. Now s/p pericardiocentesis with large pericardial effusion and signs of tamponade.  Volume status elevated.  Weight trending up. CVP up 13. Will give 80 mg IV lasix now and resume po 80 mg po lasix tonight.  2. PAH with cor pulmonale: Severe PAH. Had recent [place] in 1/16. Continue ambrisentan and Tyvaso, tadalafil restarted at 20 mg.  She tolerated milrinone taper.  Good co-ox today at 74%. CVP 13. Weight trending up.   3. CKD III: Renal function stable.  4. 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  5. Pericardial effusion: s/p pericardiocentesis for large effusion with signs of tamponade. No malignant cells.  Pericardial drain removed yesterday.   6. AF with RVR: now in NSR on amio. Not a candidate for long term anticoagulants due to large effusion and AVMs.  7. Hypotension 8. Microcytic anemia: Iron stores low. Received  Feraheme daily 2 days.  Started on iron and will need to continue.   Possible D/C tomorrow.  Length of Stay: 6  CLEGG,AMY NP-C  05/08/2014, 7:12 AM Advanced Heart Failure Team Pager [phone] (M-F; [place])  Please contact [facility] for night-coverage after hours (4p -7a ) and weekends on [email or web address]  Patient seen with NP, agree with note.  Co-ox remains good (75%) off milrinone.  CVP up to 13 today.  I will give her a dose of Lasix 80 mg IV x 1 then 80 mg po bid starting tonight.  Ambulate.  Repeat echo to reassess pericardial effusion.  Hopefully home tomorrow.   [name] 05/08/2014 7:47 AM

## 2014-05-09 DIAGNOSIS — I319 Disease of pericardium, unspecified: Secondary | ICD-10-CM

## 2014-05-09 LAB — BASIC METABOLIC PANEL WITH GFR
Anion gap: 10 (ref 5–15)
BUN: 41 mg/dL — ABNORMAL HIGH (ref 6–23)
CO2: 29 mmol/L (ref 19–32)
Calcium: 9.5 mg/dL (ref 8.4–10.5)
Chloride: 99 mmol/L (ref 96–112)
Creatinine, Ser: 1.58 mg/dL — ABNORMAL HIGH (ref 0.50–1.10)
GFR calc Af Amer: 39 mL/min — ABNORMAL LOW
GFR calc non Af Amer: 34 mL/min — ABNORMAL LOW
Glucose, Bld: 96 mg/dL (ref 70–99)
Potassium: 3.8 mmol/L (ref 3.5–5.1)
Sodium: 138 mmol/L (ref 135–145)

## 2014-05-09 LAB — GLUCOSE, CAPILLARY: Glucose-Capillary: 90 mg/dL (ref 70–99)

## 2014-05-09 LAB — CLOSTRIDIUM DIFFICILE BY PCR: Toxigenic C. Difficile by PCR: NEGATIVE

## 2014-05-09 LAB — CARBOXYHEMOGLOBIN
Carboxyhemoglobin: 1.7 % — ABNORMAL HIGH (ref 0.5–1.5)
Methemoglobin: 1.1 % (ref 0.0–1.5)
O2 Saturation: 63.1 %
Total hemoglobin: 13.9 g/dL (ref 12.0–16.0)

## 2014-05-09 MED ORDER — AMIODARONE HCL 200 MG PO TABS: 200.0000 mg | ORAL_TABLET | Freq: Every day | ORAL | Status: DC

## 2014-05-09 MED ORDER — TADALAFIL (PAH) 20 MG PO TABS: 20.0000 mg | ORAL_TABLET | Freq: Every day | ORAL | Status: DC

## 2014-05-09 MED ORDER — FUROSEMIDE 80 MG PO TABS: 80.0000 mg | ORAL_TABLET | Freq: Two times a day (BID) | ORAL | Status: DC

## 2014-05-09 MED ORDER — ENOXAPARIN SODIUM 40 MG/0.4ML ~~LOC~~ SOLN
40.0000 mg | SUBCUTANEOUS | Status: DC
  Filled 2014-05-09: qty 0.4

## 2014-05-09 MED ORDER — DOCUSATE SODIUM 100 MG PO CAPS
100.0000 mg | ORAL_CAPSULE | Freq: Two times a day (BID) | ORAL | Status: DC
  Filled 2014-05-09 (×2): qty 1

## 2014-05-09 NOTE — Progress Notes (Signed)
  Echocardiogram 2D Echocardiogram limited has been performed.  Flavius Repsher 05/09/2014, 11:06 AM

## 2014-05-09 NOTE — Progress Notes (Signed)
Patient ID: [name], female   DOB: 12-Aug-1950, 64 y.o.   MRN: [id number] Advanced Heart Failure Rounding Note   Subjective:    Underwent pericardiocentesis on 2/19 with over 1L of bloody fluid out. No malignant cells.   Foley placed 2/19 for urinary retention with 700cc out. Developed AF on 2/19 converted to NSR with IV amio.Pericardial drain placed and removed 2/23. Yesterday she received IV lasix. Weight down 6 pounds. Able to ambulate 350 feet    Denies dyspnea.  Objective:   Weight Range:  Vital Signs:   Temp:  [97.5 F (36.4 C)-98.7 F (37.1 C)] 98.6 F (37 C) (02/25 0400) Pulse Rate:  [54-70] 66 (02/25 0600) Resp:  [10-19] 14 (02/25 0600) BP: (74-103)/(24-48) 90/46 mmHg (02/25 0600) SpO2:  [90 %-100 %] 92 % (02/25 0600) Weight:  [143 lb 1.3 oz (64.9 kg)] 143 lb 1.3 oz (64.9 kg) (02/25 0400) Last BM Date: 05/06/14  Weight change: Filed Weights   05/07/14 0500 05/08/14 0246 05/09/14 0400  Weight: 146 lb 6.2 oz (66.4 kg) 148 lb 9.4 oz (67.4 kg) 143 lb 1.3 oz (64.9 kg)    Intake/Output:   Intake/Output Summary (Last 24 hours) at 05/09/14 0711 Last data filed at 05/08/14 2200  Gross per 24 hour  Intake    860 ml  Output   1450 ml  Net   -590 ml     PHYSICAL EXAM: CVP 7-8 General: NAD  Neck: JVP 6-7 no thyromegaly or thyroid nodule.  Lungs: Crackles at bases.  CV: RV heave. Heart regular S1/S2, right-sided S3, loud p2 2/6 HSM LLSB. No peripheral edema.  Abdomen: Soft, nontender, no hepatosplenomegaly. Mildly distended.  Neurologic: Alert and oriented x 3.  Psych: Normal affect. Extremities: No clubbing or cyanosis. LUE PICC  Telemetry: SR 60s  Labs: Basic Metabolic Panel:  Recent Labs Lab 05/05/14 0436 05/06/14 0500 05/07/14 0345 05/08/14 0300 05/09/14 0410  NA 134* 133* 135 134* 138  K 4.2 3.8 4.5 4.4 3.8  CL 97 95* 101 101 99  CO2 _0 GLUCOSE 187* 202* 102* 87 96  BUN 40* 38* 37* 36* 41*  CREATININE 1.98* 1.70* 1.53*  1.55* 1.58*  CALCIUM 9.3 9.2 9.2 9.0 9.5    Liver Function Tests:  Recent Labs Lab 05/02/14 1022 05/03/14 2335  AST 45*  --   ALT 12  --   ALKPHOS 141*  --   BILITOT 1.0  --   PROT 7.5 7.3  ALBUMIN 3.3*  --     Recent Labs Lab 05/03/14 2335  AMYLASE 72   No results for input(s): AMMONIA in the last 168 hours.  CBC:  Recent Labs Lab 05/02/14 1022 05/04/14 0430 05/06/14 0500 05/07/14 0345 05/08/14 0300  WBC 7.0 11.6* 6.6 5.8 6.1  HGB 8.7* 9.9* 9.3* 9.1* 8.8*  HCT 26.1* 30.1* 28.6* 28.6* 27.2*  MCV 79.1 79.8 79.4 81.7 80.0  PLT 195 232 196 185 184    Cardiac Enzymes: No results for input(s): CKTOTAL, CKMB, CKMBINDEX, TROPONINI in the last 168 hours.  BNP: BNP (last 3 results)  Recent Labs  03/15/14 0300 04/26/14 1716 05/02/14 1022  BNP 223.6* 120.9* 670.3*    ProBNP (last 3 results)  Recent Labs  06/09/13 0825 09/12/13 1121 12/26/13 1210  PROBNP 2707.0* 1481* 1194*      Other results:    Imaging: No results found.   Medications:     Scheduled Medications: . ambrisentan  10 mg Oral Daily  . amiodarone  200 mg Oral BID  . enoxaparin (LOVENOX) injection  30 mg Subcutaneous Q24H  . ferrous sulfate  325 mg Oral BID WC  . FLUoxetine  20 mg Oral Daily  . furosemide  80 mg Oral BID  . gabapentin  100 mg Oral BID  . pregabalin  150 mg Oral TID  . sodium chloride  10-40 mL Intracatheter Q12H  . tadalafil  20 mg Oral Daily  . Treprostinil  18 mcg Inhalation QID    Infusions: . norepinephrine (LEVOPHED) Adult infusion Stopped (05/05/14 1203)    PRN Medications: sodium chloride, acetaminophen, alum & mag hydroxide-simeth, guaiFENesin-dextromethorphan, HYDROcodone-acetaminophen, ondansetron (ZOFRAN) IV, simethicone, sodium chloride   Assessment/Plan    64 yo with history of PAH and scleroderma was admitted in 1/16 with severe PAH, right-sided heart failure and large pericardial effusion. Treatment has been limited in past by  multiple med intolerances and compliance issues.  1. 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 Echo 03/2014 showed EF 55-60% with severely dilated and moderately dysfunctional RV. Volume status stable. Now s/p pericardiocentesis with large pericardial effusion and signs of tamponade.  Volume status improved. CVP 7-8. Continue 80 mg po lasix twice a day.  Renal function stable. ECHO pending ordered yesterday.   2. PAH with cor pulmonale: Severe PAH. Had recent [place] in 1/16. Continue ambrisentan and Tyvaso, tadalafil restarted at 20 mg.  She tolerated milrinone taper.  CO-OX pending. CVP 7-8.  3. CKD III: Renal function stable.  4. 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  5. Pericardial effusion: s/p pericardiocentesis for large effusion with signs of tamponade. No malignant cells.  Pericardial drain removed yesterday.   6. AF with RVR: now in NSR on amio. Not a candidate for long term anticoagulants due to large effusion and AVMs.  7. Hypotension 8. Microcytic anemia: Iron stores low. Received  Feraheme daily 2 days.  Started on iron and will need to continue. Add stool softner.   Possible D/C today Length of Stay: 7  CLEGG,AMY NP-C  05/09/2014, 7:11 AM Advanced Heart Failure Team Pager [phone] (M-F; [place])  Please contact [facility] for night-coverage after hours (4p -7a ) and weekends on [email or web address]  Patient seen with NP, agree with the above note.  Doing better overall.  CVP 7-8.  Diuresed yesterday.  I will let her go home today after repeat echo to reassess pericardial effusion.   Followup next week in CHF office.  Home regimen will be tadalafil 20 daily, Letairis 10 daily, Tyvaso per home regimen, Lasix 80 mg po bid, amiodarone 200 mg daily.  Non-cardiac meds as per prior to admission.   [name] 05/09/2014 7:44 AM

## 2014-05-09 NOTE — Progress Notes (Signed)
LUA PICC d/c'ed per order for d/c to home.  Site WNL.  Site cleaned with chlorhexadine and covered with vaseline guaze and dry2x2.  No signs of infection noted.  Pt verbalizes understanding of site care, dressing care and signs of infection via teach back method.  No ADN.  RN aware.

## 2014-05-09 NOTE — Discharge Summary (Signed)
Advanced Heart Failure Team  Discharge Summary   Patient ID: [name] MRN: [id number], DOB/AGE: 10/06/1950 64 y.o. Admit date: 05/02/2014 D/C date:     05/09/2014   Primary Discharge Diagnoses:  1. 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  2. PAH with cor pulmonale: Severe PAH.  3. CKD III: Renal function stable.  4. 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  5. Pericardial effusion: s/p pericardiocentesis for large effusion with signs of tamponade. No malignant cells. 6. AF with RVR: now in NSR on amio. Not a candidate for long term anticoagulants due to large effusion and AVMs.  7. Hypotension 8. Microcytic anemia- on Iron.   Hospital Course:  Ms Bibby is a 64 year old with a history of of PAH and scleroderma was admitted in 1/16 with severe PAH, right-sided heart failure and large pericardial effusion.Treatment has been limited in past by multiple med intolerances and compliance issues. Admitted from the HF clinic with increased dyspnea and volume overload. She had PICC placed with short term milrinone. After being adequately diuresed and pericardial effusion was removed and she was stable she deemed appropriate for discharge.   1. 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 Echo  05/03/2014 Large pericardial effusion noted. Had PICC line placed to follow CVPs and CO-OX. She was placed on milrinone + IV lasix. She also required pericardiocentesis due to large pericardial effusion and signs of tamponade noted on ECHO. As she improved this was removed. As her volume status improved she stopped IV lasix and milrinone. She transitioned to lasix 80 mg twice a day. Repeat ECHO on the day of discharge showed no pericardial effusion and EF 60-65%.  2. PAH with cor pulmonale: Severe PAH. Had recent [place] in 1/16. Continue ambrisentan and Tyvaso, tadalafil restarted at 20 mg.  3. CKD III: Renal function stable.  4. 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 Hemoglobin remained stable.   5. Pericardial effusion: s/p pericardiocentesis for large effusion with signs of tamponade noted on ECHO.  No malignant cells. Pericardial drain was removed 05/07/14. Repeat ECHO on 05/09/14 wit no evidence of pericardial effusion.   6. AF with RVR: She had an episode of A fib RVR. Chemically converted to NSR on amio. Continue amiodarone 200 mg daily. Watch closely and likely stop as an outpatient due chronic pulmonary issues.  She is not a candidate for long term anticoagulants due to large effusion and AVMs.  7. Hypotension- Stable.  8. Microcytic anemia: Iron stores low. Received Feraheme daily 2 days. Started on iron and will need to continue. She will continue on stool softner.    Discharge Weight Range: 143 pounds Discharge Vitals: Blood pressure 77/40, pulse 63, temperature 98 F (36.7 C), temperature source Oral, resp. rate 16, height _0  (1.626 m), weight 143 lb 1.3 oz (64.9 kg), SpO2 94 %.  Labs: Lab Results  Component Value Date   WBC 6.1 05/08/2014   HGB 8.8* 05/08/2014   HCT 27.2* 05/08/2014   MCV 80.0 05/08/2014   PLT 184 05/08/2014    Recent Labs Lab 05/03/14 2335  05/09/14 0410  NA  --   < > 138  K  --   < > 3.8  CL  --   < > 99  CO2  --   < > 29  BUN  --   < > 41*  CREATININE 1.73*  < > 1.58*  CALCIUM  --   < >  9.5  PROT 7.3  --   --   GLUCOSE 131*  < > 96  < > = values in this interval not displayed. Lab Results  Component Value Date   CHOL 98 04/16/2013   HDL 31* 04/16/2013   LDLCALC 48 04/16/2013   TRIG 93 04/16/2013   BNP (last 3 results)  Recent Labs  03/15/14 0300 04/26/14 1716 05/02/14 1022  BNP 223.6* 120.9* 670.3*    ProBNP (last 3 results)  Recent Labs  06/09/13 0825 09/12/13 1121 12/26/13 1210  PROBNP 2707.0* 1481* 1194*     Diagnostic Studies/Procedures   No results found.  Discharge Medications     Medication List    TAKE these medications         ambrisentan 10 MG tablet  Commonly known as:  LETAIRIS  Take 1 tablet (10 mg total) by mouth daily.     amiodarone 200 MG tablet  Commonly known as:  PACERONE  Take 1 tablet (200 mg total) by mouth daily.     [employer] IRON 325 (65 FE) MG tablet  Generic drug:  ferrous sulfate  Take 325 mg by mouth daily with breakfast.     esomeprazole 40 MG capsule  Commonly known as:  NEXIUM  Take 40 mg by mouth 2 (two) times daily.     fenofibrate 54 MG tablet  Take 1 tablet (54 mg total) by mouth daily.     FLUoxetine 20 MG capsule  Commonly known as:  PROZAC  take 1 capsule by mouth every morning     furosemide 80 MG tablet  Commonly known as:  LASIX  Take 1 tablet (80 mg total) by mouth 2 (two) times daily.     gabapentin 100 MG capsule  Commonly known as:  NEURONTIN  Take 1 capsule (100 mg total) by mouth 2 (two) times daily.     HYDROcodone-acetaminophen 10-325 MG per tablet  Commonly known as:  NORCO  Take 1 tablet by mouth every 6 (six) hours as needed for moderate pain.     pregabalin 150 MG capsule  Commonly known as:  LYRICA  Take 1 capsule (150 mg total) by mouth 3 (three) times daily.     promethazine 12.5 MG tablet  Commonly known as:  PHENERGAN  Take 1 tablet (12.5 mg total) by mouth every 6 (six) hours as needed for nausea or vomiting.     Tadalafil (PAH) 20 MG Tabs  Commonly known as:  ADCIRCA  Take 1 tablet (20 mg total) by mouth daily.     Treprostinil 0.6 MG/ML Soln  Commonly known as:  TYVASO  Inhale 18 mcg into the lungs 4 (four) times daily.        Disposition   The patient will be discharged in stable condition to home.     Discharge Instructions    Contraindication to ACEI at discharge    Complete by:  As directed      Diet - low sodium heart healthy    Complete by:  As directed      Heart Failure patients record your daily weight using the same scale at the same time of day    Complete by:  As directed      Increase activity slowly    Complete  by:  As directed           Follow-up Information    Follow up with [name], MD On 05/16/2014.   Specialty:  Cardiology   Why:  at 9:30  Garage Code [id number]   Contact information:   [address]  [place] [address] [phone]         Duration of Discharge Encounter: Greater than 35 minutes   Signed, CLEGG,AMY  NP-C 05/09/2014, 1:46 PM

## 2014-05-09 NOTE — Progress Notes (Signed)
CARDIAC REHAB PHASE I   PRE:  Rate/Rhythm: 74 SR  BP:  Supine:   Sitting: 82/43  Standing:    SaO2:   MODE:  Ambulation: 350 ft   POST:  Rate/Rhythm: 74 SR  BP:  Supine:   Sitting: 92/38  Standing:    SaO2:  1345-1410 Assisted X 1 to ambulate. Gait steady. Pt walked 350 feet without c/o. VS stable Pt back to recliner after walk with call light in reach.  Rodney Langton RN 05/09/2014 2:17 PM

## 2014-05-09 NOTE — Progress Notes (Signed)
Picc line removed and pt remained in chair x52mns, per IV team RN instructions. Pt was then removed from the monitor and helped to the bathroom to get dressed. Discharge summary reviewed with patient, home medications returned to patient from pharmacy. Pt transported to car via wheelchair.

## 2014-05-09 NOTE — Discharge Instructions (Signed)

## 2014-05-13 DIAGNOSIS — N952 Postmenopausal atrophic vaginitis: Secondary | ICD-10-CM | POA: Diagnosis not present

## 2014-05-13 DIAGNOSIS — N760 Acute vaginitis: Secondary | ICD-10-CM | POA: Diagnosis not present

## 2014-05-14 ENCOUNTER — Telehealth: Payer: Self-pay | Admitting: Neurology

## 2014-05-14 ENCOUNTER — Other Ambulatory Visit (HOSPITAL_COMMUNITY): Payer: Self-pay | Admitting: Adult Health

## 2014-05-14 DIAGNOSIS — I272 Pulmonary hypertension, unspecified: Secondary | ICD-10-CM

## 2014-05-14 NOTE — Telephone Encounter (Signed)
I called and spoke with the pharmacy.  They said the medication is covered, and with ins her co-pay was brought down to $73.  (Price without ins is $650 per fill) They indicate the patient has a deductible she has to meet, and after that, he co-pay should go back to the normal cost.  I called the patient back.  Relayed info from pharmacy.  She verbalized understanding.  Said her co-pay in Jan was $150 and next co-pay was $78.  She understands co-pay should decrease once deductible is met.  Will call us back if anything further is needed.

## 2014-05-14 NOTE — Telephone Encounter (Signed)
Pt is calling stating that her insurance will no longer pay for pregabalin (LYRICA) 150 MG capsule. She wants to know if she can get samples or is there a cheaper medication she can go on. Please call and advise.

## 2014-05-16 ENCOUNTER — Ambulatory Visit (HOSPITAL_COMMUNITY)
Admit: 2014-05-16 | Discharge: 2014-05-16 | Disposition: A | Discharge status: Home / Self Care | Payer: Medicare Other | Source: Ambulatory Visit | Attending: Cardiology | Admitting: Cardiology

## 2014-05-16 ENCOUNTER — Encounter (HOSPITAL_COMMUNITY): Payer: Self-pay

## 2014-05-16 VITALS — BP 87/40 | HR 70 | Resp 18 | Wt 141.5 lb

## 2014-05-16 DIAGNOSIS — I2721 Secondary pulmonary arterial hypertension: Secondary | ICD-10-CM

## 2014-05-16 DIAGNOSIS — I313 Pericardial effusion (noninflammatory): Secondary | ICD-10-CM | POA: Insufficient documentation

## 2014-05-16 DIAGNOSIS — I272 Other secondary pulmonary hypertension: Secondary | ICD-10-CM

## 2014-05-16 DIAGNOSIS — I509 Heart failure, unspecified: Secondary | ICD-10-CM

## 2014-05-16 DIAGNOSIS — N183 Chronic kidney disease, stage 3 unspecified: Secondary | ICD-10-CM

## 2014-05-16 DIAGNOSIS — I4891 Unspecified atrial fibrillation: Secondary | ICD-10-CM | POA: Insufficient documentation

## 2014-05-16 DIAGNOSIS — I5033 Acute on chronic diastolic (congestive) heart failure: Secondary | ICD-10-CM | POA: Diagnosis not present

## 2014-05-16 DIAGNOSIS — I50810 Right heart failure, unspecified: Secondary | ICD-10-CM

## 2014-05-16 LAB — HEPATIC FUNCTION PANEL
ALK PHOS: 121 U/L — AB (ref 39–117)
ALT: 11 U/L (ref 0–35)
AST: 28 U/L (ref 0–37)
Albumin: 3.7 g/dL (ref 3.5–5.2)
BILIRUBIN INDIRECT: 0.6 mg/dL (ref 0.3–0.9)
BILIRUBIN TOTAL: 0.8 mg/dL (ref 0.3–1.2)
Bilirubin, Direct: 0.2 mg/dL (ref 0.0–0.5)
TOTAL PROTEIN: 8.9 g/dL — AB (ref 6.0–8.3)

## 2014-05-16 LAB — BASIC METABOLIC PANEL
ANION GAP: 12 (ref 5–15)
BUN: 79 mg/dL — ABNORMAL HIGH (ref 6–23)
CHLORIDE: 106 mmol/L (ref 96–112)
CO2: 24 mmol/L (ref 19–32)
CREATININE: 2.09 mg/dL — AB (ref 0.50–1.10)
Calcium: 9.7 mg/dL (ref 8.4–10.5)
GFR, EST AFRICAN AMERICAN: 28 mL/min — AB (ref >90)
GFR, EST NON AFRICAN AMERICAN: 24 mL/min — AB (ref >90)
Glucose, Bld: 87 mg/dL (ref 70–99)
Potassium: 4.4 mmol/L (ref 3.5–5.1)
Sodium: 142 mmol/L (ref 135–145)

## 2014-05-16 LAB — TSH: TSH: 8.796 u[IU]/mL — ABNORMAL HIGH (ref 0.350–4.500)

## 2014-05-16 MED ORDER — TADALAFIL (PAH) 20 MG PO TABS: 40.0000 mg | ORAL_TABLET | Freq: Every day | ORAL | Status: DC

## 2014-05-16 MED ORDER — FUROSEMIDE 80 MG PO TABS: ORAL_TABLET | ORAL | Status: DC

## 2014-05-16 NOTE — Progress Notes (Signed)
Patient ID: [name], female   DOB: 08/21/1950, 64 y.o.   MRN: [id number] Primary Cardiologist: Dr Haroldine Laws Pulmonary: Dr Lamonte Sakai   HPI: [name] is a 64 yo female with a history of DM2, HLD, HTN, pHTN, GERD, gastroparesis, scleroderma, DJD and diastolic HF.   Of note [name] has been on several PAH targeted therapies and coumadin over last 9 years, but has not been reliably on therapy due to noncompliance and inability to have labs performed. [name] has had best response to Tyvaso (+ bosentan) with an improvement in 6 minute walk and in PASP from 102 mmHg (7/09) to 42 mmHg (9/10). After stopping Tyvaso her PASP rose to 80's by TTE 6/13 and 11/14. Surprisingly, [name] has never required supplemental O2. Macitentan was started in 11/14. [name] developed nausea and diarrhea and macitentan was stopped early 3/15. Most recently, [name] had been on Letairis and Adcirca (followed by Dr. Lamonte Sakai).   Admitted 10/14-10/18/15 for A/C RHF, volume overload and syncope. Diuresed with IV lasix and milrinone a total of 26 lbs. Discharge weight was 156 lbs and started on lasix 40 mg BID.   Admitted 12/29 with dyspnea and chest pain. BP was low, and Lasix and Adcirca were both initially held. Echo showed EF 60-65% with moderately dilated and dysfunctional RV, PASP 111 mmHg with moderate TR. There was a large pericardial effusion without definite tamponade. Presentation was suspected to be due to progressive PAH with secondary RV failure rather than tamponade. No pericardiocentesis yet. [name] was started on milrinone gtt and diuresed. Shorter-acting sildenafil was started instead of Adcirca. Also had GI bleed. 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 Discharge weight was 146 pounds.   Echo (1/16) with EF 55-60%, severely dilated RV with moderately decreased systolic function, large pericardial effusion.   [name] was admitted in 2/16 with acute on chronic diastolic CHF/RV failure and  hypotension.  Echo (2/16) showed EF 60%, moderate-severe RV dilation with moderate-severe RV systolic dysfunction, PA systolic pressure 62 mmHg, large pericardial effusion with borderline tamponade.  [name] had pericardiocentesis with improvement in symptoms.  No malignant cells in pericardial fluid.  Repeat echo showed no pericardial effusion.  [name] developed paroxysmal atrial fibrillation on 2/19 but went back into NSR on amiodarone.  [name] was not anticoagulated given history of GI bleeding and pericardial effusion.   [name] is currently short of breath walking about 50 yards.  [name] is short of breath with housework.  [name] is lightheaded when [name] first gets up in the morning but has had no falls or syncope.   6 min walk (05/16/14): 125 m  RHC (1/16):  Hemodynamics (mmHg) RA mean 12 RV 64/14 PA 68/29, mean 42 PCWP mean 12 LV 102/14 AO 105/53 PA 67% AO 93% Cardiac Output (Fick) 6.63  Cardiac Index (Fick) 3.88  PVR 4.5 WU  Labs 03/24/2014 K 3.8 Creatinine 1.51 Hgb 9.3  Labs 2/16 BNP 121, K 4.4, creatinine 1.27, hgb 9.2 Labs 2/16 K 3.8, creatinine 1.58, AST 45, ALT 12  ROS: All systems negative except as listed in HPI, PMH and Problem List.  SH:  History   Social History  . Marital Status: Legally Separated    Spouse Name: N/A  . Number of Children: 2  . Years of Education: 11   Occupational History  . Umemployed   . Disability     Social History Main Topics  . Smoking status: Never Smoker   . Smokeless tobacco: Never Used  .  Alcohol Use: No  . Drug Use: No  . Sexual Activity: Not on file   Other Topics Concern  . Not on file   Social History Narrative    FAMILY HISTORY:  Significant for coronary artery disease and diabetes   Patient lives at home with granddaughter.    Patient has 2 children.    Patient has 11 years of education.    Patient is on disability.    Patient is right handed.    Patient is separated.      FH:  Family History  Problem Relation Age of Onset   . Heart disease Mother   . Diabetes Mother   . Diabetes Sister     Past Medical History  Diagnosis Date  . Secondary pulmonary hypertension     right heart cath 04/20/04  . Cough   . Allergic rhinitis, cause unspecified   . Esophageal reflux   . Systemic sclerosis   . Unspecified essential hypertension   . Gastritis   . Sickle cell trait     "trace"  . Obesity   . Visual changes   . Scleroderma   . Diastolic dysfunction   . Trichomonas   . Vaginal bleeding   . Neuropathy   . CHF (congestive heart failure)   . Type II diabetes mellitus     DIET CONTROL   . Bursitis   . History of blood transfusion     "think it was related to when I had partial hysteretomy; might have been for one of my knee ORs"  . Degeneration of lumbar or lumbosacral intervertebral disc   . Arthritis     "all over my body" (08/04/2013)  . Chronic kidney disease     "been in hospital for it; don't know what kind" (08/04/2013)    Current Outpatient Prescriptions  Medication Sig Dispense Refill  . ambrisentan (LETAIRIS) 10 MG tablet Take 1 tablet (10 mg total) by mouth daily. 30 tablet 11  . amiodarone (PACERONE) 200 MG tablet Take 1 tablet (200 mg total) by mouth daily. 30 tablet 6  . esomeprazole (NEXIUM) 40 MG capsule Take 40 mg by mouth 2 (two) times daily.     . fenofibrate 54 MG tablet Take 1 tablet (54 mg total) by mouth daily. 30 tablet 12  . ferrous sulfate ([employer] IRON) 325 (65 FE) MG tablet Take 325 mg by mouth daily with breakfast.    . FLUoxetine (PROZAC) 20 MG capsule take 1 capsule by mouth every morning 30 capsule 2  . furosemide (LASIX) 80 MG tablet Take one and one half pills (138m) int the AM and take one pill (823m in the PM 90 tablet 3  . gabapentin (NEURONTIN) 100 MG capsule Take 1 capsule (100 mg total) by mouth 2 (two) times daily. 60 capsule 11  . HYDROcodone-acetaminophen (NORCO) 10-325 MG per tablet Take 1 tablet by mouth every 6 (six) hours as needed for moderate pain.     .  pregabalin (LYRICA) 150 MG capsule Take 1 capsule (150 mg total) by mouth 3 (three) times daily. 90 capsule 5  . promethazine (PHENERGAN) 12.5 MG tablet Take 1 tablet (12.5 mg total) by mouth every 6 (six) hours as needed for nausea or vomiting. 45 tablet 1  . Tadalafil, PAH, (ADCIRCA) 20 MG TABS Take 2 tablets (40 mg total) by mouth daily. 60 tablet 6  . Treprostinil (TYVASO) 0.6 MG/ML SOLN Inhale 18 mcg into the lungs 4 (four) times daily.      No  current facility-administered medications for this encounter.    Filed Vitals:   05/16/14 0938  BP: 87/40  Pulse: 70  Resp: 18  Weight: 141 lb 8 oz (64.184 kg)  SpO2: 97%    PHYSICAL EXAM:  General: NAD Neck: JVP 8-9 cm, no thyromegaly or thyroid nodule.  Lungs: Mild crackles at bases.  CV: RV heave. Heart regular S1/S2, right-sided S3, 2/6 HSM LLSB. No peripheral edema.  Abdomen: Soft, nontender, no hepatosplenomegaly. Mildly distended.   Neurologic: Alert and oriented x 3.  Psych: Normal affect. Extremities: No clubbing or cyanosis.   ASSESSMENT & PLAN: 1. 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  Echo 04/2014 showed EF 60% with moderate-severely dilated and moderately dysfunctional RV. There was a large pericardial effusion with probable early tamponade.  [name] underwent pericardiocentesis. Weight is down 18 lbs.  [name] has chronically low BP.  By exam, [name] has some volume overload still but much improved.  - I initially had planned for her to increase her Lasix, but BMET today came back with elevated BUN/creatinine so I will have her keep Lasix at 80 mg bid and repeat BMET 1 week.    2. PAH with cor pulmonale: Severe PAH.  Had recent [place] in 1/16.  6 minute walk today: 125 meters. - Continue ambrisentan 10 mg daily - Continue Tyvaso - Continue Adcirca 40 mg daily.  3. CKD III: BMET today showed creatinine up.  I will not increase Lasix. 4. 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  5. Pericardial  effusion: Early tamponade on echo at last admission, s/p pericardiocentesis.  Repeat echo in 2 wks to see if pericardial fluid is returning.  6. Atrial fibrillation: [name] is in NSR today.  Paroxysmal.  [name] is on amiodarone to maintain NSR as [name] will tolerate atrial fibrillation poorly.  [name] is not anticoagulated due to h/o GI bleeding and pericardial effusion.  Check LFTs and TSH today.  [name] will need a yearly eye exam while on amiodarone.  Followup in 2 wks.  [name] 05/16/2014

## 2014-05-16 NOTE — Progress Notes (Addendum)
6 Minute Walk  Start 95% room air 88HR  Patient had slow gait with frequent rest breaks.  Unsteady on feet at times due to dizziness which patient states she has "all the time".  BP runs 80s/40s.  Total ambulation 510 feet.  End 93% room air 94 HR  Dr. [name] made aware of results.

## 2014-05-16 NOTE — Patient Instructions (Signed)
Labs today CHANGE Lasix to 120 mg in the AM and 80 mg in the PM CHANGE Adcirca to 40 mg daily  Your physician recommends that you schedule a follow-up appointment in:  2 weeks with a echocardiogram and labs (BMET)  Do the following things EVERYDAY: 1) Weigh yourself in the morning before breakfast. Write it down and keep it in a log. 2) Take your medicines as prescribed 3) Eat low salt foods-Limit salt (sodium) to 2000 mg per day.  4) Stay as active as you can everyday 5) Limit all fluids for the day to less than 2 liters 6)

## 2014-05-17 ENCOUNTER — Telehealth (HOSPITAL_COMMUNITY): Payer: Self-pay | Admitting: *Deleted

## 2014-05-17 ENCOUNTER — Encounter (HOSPITAL_COMMUNITY): Payer: Commercial Managed Care - HMO

## 2014-05-17 DIAGNOSIS — I5033 Acute on chronic diastolic (congestive) heart failure: Secondary | ICD-10-CM

## 2014-05-17 MED ORDER — FUROSEMIDE 80 MG PO TABS: 80.0000 mg | ORAL_TABLET | Freq: Two times a day (BID) | ORAL | Status: DC

## 2014-05-17 NOTE — Telephone Encounter (Signed)
-----  Message from Larey Dresser, MD sent at 05/16/2014  5:23 PM EST ----- Creatinine higher.  I told her to increase Lasix today. Have her go back to taking Lasix 80 mg bid, do not increase to Lasix 120 qam/80 qpm.  She will need repeat BMET in 1 week.  At that time, draw SPEP (elevated total protein) and free T3/free T4.  Please call asap about Lasix dosing.

## 2014-05-17 NOTE — Telephone Encounter (Signed)
Pt aware, labs 3/10

## 2014-05-19 DIAGNOSIS — I5030 Unspecified diastolic (congestive) heart failure: Secondary | ICD-10-CM | POA: Diagnosis not present

## 2014-05-19 DIAGNOSIS — E119 Type 2 diabetes mellitus without complications: Secondary | ICD-10-CM | POA: Diagnosis not present

## 2014-05-19 DIAGNOSIS — I129 Hypertensive chronic kidney disease with stage 1 through stage 4 chronic kidney disease, or unspecified chronic kidney disease: Secondary | ICD-10-CM | POA: Diagnosis not present

## 2014-05-19 DIAGNOSIS — N183 Chronic kidney disease, stage 3 (moderate): Secondary | ICD-10-CM | POA: Diagnosis not present

## 2014-05-19 DIAGNOSIS — K3184 Gastroparesis: Secondary | ICD-10-CM | POA: Diagnosis not present

## 2014-05-19 DIAGNOSIS — D649 Anemia, unspecified: Secondary | ICD-10-CM | POA: Diagnosis not present

## 2014-05-21 DIAGNOSIS — I5030 Unspecified diastolic (congestive) heart failure: Secondary | ICD-10-CM | POA: Diagnosis not present

## 2014-05-21 DIAGNOSIS — N183 Chronic kidney disease, stage 3 (moderate): Secondary | ICD-10-CM | POA: Diagnosis not present

## 2014-05-21 DIAGNOSIS — E119 Type 2 diabetes mellitus without complications: Secondary | ICD-10-CM | POA: Diagnosis not present

## 2014-05-21 DIAGNOSIS — D649 Anemia, unspecified: Secondary | ICD-10-CM | POA: Diagnosis not present

## 2014-05-21 DIAGNOSIS — I129 Hypertensive chronic kidney disease with stage 1 through stage 4 chronic kidney disease, or unspecified chronic kidney disease: Secondary | ICD-10-CM | POA: Diagnosis not present

## 2014-05-21 DIAGNOSIS — K3184 Gastroparesis: Secondary | ICD-10-CM | POA: Diagnosis not present

## 2014-05-22 ENCOUNTER — Ambulatory Visit (HOSPITAL_COMMUNITY)
Admission: RE | Admit: 2014-05-22 | Discharge: 2014-05-22 | Disposition: A | Discharge status: Home / Self Care | Payer: Medicare Other | Source: Ambulatory Visit | Attending: Internal Medicine | Admitting: Internal Medicine

## 2014-05-22 ENCOUNTER — Telehealth: Payer: Self-pay | Admitting: Emergency Medicine

## 2014-05-22 DIAGNOSIS — I5022 Chronic systolic (congestive) heart failure: Secondary | ICD-10-CM | POA: Insufficient documentation

## 2014-05-22 DIAGNOSIS — I50812 Chronic right heart failure: Secondary | ICD-10-CM

## 2014-05-22 LAB — BASIC METABOLIC PANEL
Anion gap: 11 (ref 5–15)
BUN: 75 mg/dL — AB (ref 6–23)
CO2: 19 mmol/L (ref 19–32)
Calcium: 9.8 mg/dL (ref 8.4–10.5)
Chloride: 110 mmol/L (ref 96–112)
Creatinine, Ser: 2.12 mg/dL — ABNORMAL HIGH (ref 0.50–1.10)
GFR calc Af Amer: 28 mL/min — ABNORMAL LOW (ref >90)
GFR calc non Af Amer: 24 mL/min — ABNORMAL LOW (ref >90)
GLUCOSE: 108 mg/dL — AB (ref 70–99)
Potassium: 3.7 mmol/L (ref 3.5–5.1)
Sodium: 140 mmol/L (ref 135–145)

## 2014-05-22 NOTE — Telephone Encounter (Signed)
Form has been completed and faxed in. RB's signature was not needed on form.

## 2014-05-22 NOTE — Telephone Encounter (Signed)
Form has been received from [facility] and placed in RB look at to be signed so this can be sent back.  Will forward to lindsay and RB to make them aware.

## 2014-05-23 DIAGNOSIS — M359 Systemic involvement of connective tissue, unspecified: Secondary | ICD-10-CM | POA: Diagnosis not present

## 2014-05-23 DIAGNOSIS — I272 Other secondary pulmonary hypertension: Secondary | ICD-10-CM | POA: Diagnosis not present

## 2014-05-23 LAB — T3, FREE: T3, Free: 2 pg/mL (ref 2.0–4.4)

## 2014-05-23 LAB — T4, FREE: Free T4: 1.37 ng/dL (ref 0.80–1.80)

## 2014-05-24 DIAGNOSIS — N183 Chronic kidney disease, stage 3 (moderate): Secondary | ICD-10-CM | POA: Diagnosis not present

## 2014-05-24 DIAGNOSIS — I5030 Unspecified diastolic (congestive) heart failure: Secondary | ICD-10-CM | POA: Diagnosis not present

## 2014-05-24 DIAGNOSIS — I129 Hypertensive chronic kidney disease with stage 1 through stage 4 chronic kidney disease, or unspecified chronic kidney disease: Secondary | ICD-10-CM | POA: Diagnosis not present

## 2014-05-24 DIAGNOSIS — E119 Type 2 diabetes mellitus without complications: Secondary | ICD-10-CM | POA: Diagnosis not present

## 2014-05-24 DIAGNOSIS — K3184 Gastroparesis: Secondary | ICD-10-CM | POA: Diagnosis not present

## 2014-05-24 DIAGNOSIS — D649 Anemia, unspecified: Secondary | ICD-10-CM | POA: Diagnosis not present

## 2014-05-24 LAB — PROTEIN ELECTROPHORESIS, SERUM
Albumin ELP: 46.3 % — ABNORMAL LOW (ref 55.8–66.1)
Alpha-1-Globulin: 5.1 % — ABNORMAL HIGH (ref 2.9–4.9)
Alpha-2-Globulin: 8.5 % (ref 7.1–11.8)
BETA 2: 6.7 % — AB (ref 3.2–6.5)
BETA GLOBULIN: 5.8 % (ref 4.7–7.2)
Gamma Globulin: 27.6 % — ABNORMAL HIGH (ref 11.1–18.8)
M-Spike, %: NOT DETECTED g/dL
TOTAL PROTEIN ELP: 8.8 g/dL — AB (ref 6.0–8.3)

## 2014-05-28 DIAGNOSIS — K219 Gastro-esophageal reflux disease without esophagitis: Secondary | ICD-10-CM | POA: Diagnosis not present

## 2014-05-28 DIAGNOSIS — I272 Other secondary pulmonary hypertension: Secondary | ICD-10-CM | POA: Diagnosis not present

## 2014-05-28 DIAGNOSIS — M340 Progressive systemic sclerosis: Secondary | ICD-10-CM | POA: Diagnosis not present

## 2014-05-28 DIAGNOSIS — M150 Primary generalized (osteo)arthritis: Secondary | ICD-10-CM | POA: Diagnosis not present

## 2014-05-30 DIAGNOSIS — I5030 Unspecified diastolic (congestive) heart failure: Secondary | ICD-10-CM | POA: Diagnosis not present

## 2014-05-30 DIAGNOSIS — D649 Anemia, unspecified: Secondary | ICD-10-CM | POA: Diagnosis not present

## 2014-05-30 DIAGNOSIS — I129 Hypertensive chronic kidney disease with stage 1 through stage 4 chronic kidney disease, or unspecified chronic kidney disease: Secondary | ICD-10-CM | POA: Diagnosis not present

## 2014-05-30 DIAGNOSIS — K3184 Gastroparesis: Secondary | ICD-10-CM | POA: Diagnosis not present

## 2014-05-30 DIAGNOSIS — E119 Type 2 diabetes mellitus without complications: Secondary | ICD-10-CM | POA: Diagnosis not present

## 2014-05-30 DIAGNOSIS — N183 Chronic kidney disease, stage 3 (moderate): Secondary | ICD-10-CM | POA: Diagnosis not present

## 2014-05-31 ENCOUNTER — Ambulatory Visit (INDEPENDENT_AMBULATORY_CARE_PROVIDER_SITE_OTHER): Payer: Medicare Other | Admitting: Emergency Medicine

## 2014-05-31 ENCOUNTER — Encounter: Payer: Self-pay | Admitting: Emergency Medicine

## 2014-05-31 VITALS — BP 92/58 | HR 57 | Ht 64.0 in | Wt 138.0 lb

## 2014-05-31 DIAGNOSIS — I272 Other secondary pulmonary hypertension: Secondary | ICD-10-CM

## 2014-05-31 DIAGNOSIS — I2721 Secondary pulmonary arterial hypertension: Secondary | ICD-10-CM

## 2014-05-31 NOTE — Patient Instructions (Addendum)
Please continue your current [name], and [name] as you have been taking them Continue your [name] as prescribed by cardiology Follow with cardiology as planned We will plan to repeat your lab work at least 4 times a year to monitor your medications.  Follow with Dr [name] in 6 months or sooner if you have any problems

## 2014-05-31 NOTE — Progress Notes (Signed)
ROV 05/31/14 -- 64 yo woman, follow of visit for long-standing history of secondary pulmonary hypertension in the setting of scleroderma. As outlined above she has been on multiple medications at different times. Her medications have been reinitiated after stopping Tyvaso back in 2012. She hospitalized beginning of this year for decompensated DAH and right heart failure with anasarca. Her estimated arterial pressures were greater than 100 mmHg. She was started on milrinone and aggressively diuresed over extended period time. Subsequently a right heart cath showed her PA pressures improved to 68/21. She was re-hospitalized late February, required pericardiocentesis > PASP was estimated at 48mHg (a significant improvement). That hosp was complicated by GIB / AVM > her anticoagulation has been stopped.   [place]ed on Tyvaso which had been a successful medication for her in the past. She returns today for follow-up. She is up to 9 puffs, 4 times a day. Her breathing is better, still has exertional fatigue. She is having diarrhea, was having during the hospitalization.    ROS Constitutional:   No  weight loss, night sweats,  Fevers, chills, + fatigue, or  lassitude.  HEENT:   No headaches,  Difficulty swallowing,  Tooth/dental problems, or  Sore throat,                No sneezing, itching, ear ache, nasal congestion, post nasal drip,   CV:  No chest pain,  Orthopnea, PND, swelling in lower extremities, anasarca, dizziness, palpitations, syncope.   GI  No heartburn, indigestion, abdominal pain, nausea, vomiting, diarrhea, change in bowel habits, loss of appetite, bloody stools.   Resp:   No chest wall deformity  Skin: no rash or lesions.  GU: no dysuria, change in color of urine, no urgency or frequency.  No flank pain, no hematuria   MS:  No joint pain or swelling.  No decreased range of motion.  No back pain.  Psych:  No change in mood or  affect. No depression or anxiety.  No memory loss.   EXAM :  Filed Vitals:   05/31/14 1140  BP: 92/58  Pulse: 57  Height: _0  (1.626 m)  Weight: 138 lb (62.596 kg)  SpO2: 98%    Gen: Pleasant, chronically ill appearing  in no distress,    ENT: No lesions,  mouth clear,  oropharynx clear, no postnasal drip  Neck: No JVD, no TMG, no carotid bruits  Lungs: No use of accessory muscles, no dullness to percussion, clear without rales or rhonchi  Cardiovascular: regular, 3/6 holosystolic M with a loud S2 Musculoskeletal: No deformities, NO pretibial edema   Neuro: alert, non focal  Skin: Warm, no lesions or rashes   05/09/14 --  TTE Study Conclusions  - Left ventricle: The cavity size was normal. Systolic function was normal. The estimated ejection fraction was in the range of 60% to 65%. Wall motion was normal; there were no regional wall motion abnormalities. - Left atrium: The atrium was mildly dilated. - Right ventricle: The cavity size was moderately dilated. Wall thickness was normal. Systolic function was moderately to severely reduced. - Right atrium: The atrium was severely dilated. - Tricuspid valve: There was moderate regurgitation. - Pulmonary arteries: Systolic pressure was moderately increased. PA peak pressure: 52 mm Hg (S).  Impressions: - When compared to prior echocardiogram, pericardial fluid has resolved   PAH (pulmonary artery hypertension) Severe secondary pulmonary arterial hypertension with recent admissions for decompensation that included cor pulmonale with  anasarca and a large hemodynamically significant pericardial effusion. She is no longer on anticoagulation due to GI bleeding but she is on a good regimen for her pulmonary hypertension including Tyvaso, adcirca and letaris. Her diuretic regimen is stable.  Will continue this regimen pending her future TTE and BMP.

## 2014-05-31 NOTE — Assessment & Plan Note (Signed)
Severe secondary pulmonary arterial hypertension with recent admissions for decompensation that included cor pulmonale with anasarca and a large hemodynamically significant pericardial effusion. She is no longer on anticoagulation due to GI bleeding but she is on a good regimen for her pulmonary hypertension including Tyvaso, adcirca and letaris. Her diuretic regimen is stable.  Will continue this regimen pending her future TTE and BMP.

## 2014-06-05 ENCOUNTER — Ambulatory Visit (HOSPITAL_COMMUNITY)
Admission: RE | Admit: 2014-06-05 | Discharge: 2014-06-05 | Disposition: A | Discharge status: Home / Self Care | Payer: Medicare Other | Source: Ambulatory Visit | Attending: Internal Medicine | Admitting: Internal Medicine

## 2014-06-05 ENCOUNTER — Ambulatory Visit (HOSPITAL_BASED_OUTPATIENT_CLINIC_OR_DEPARTMENT_OTHER)
Admission: RE | Admit: 2014-06-05 | Discharge: 2014-06-05 | Disposition: A | Discharge status: Home / Self Care | Payer: Medicare Other | Source: Ambulatory Visit | Attending: Adult Health | Admitting: Adult Health

## 2014-06-05 VITALS — BP 82/40 | HR 54 | Wt 140.8 lb

## 2014-06-05 DIAGNOSIS — I5032 Chronic diastolic (congestive) heart failure: Secondary | ICD-10-CM

## 2014-06-05 DIAGNOSIS — I3139 Other pericardial effusion (noninflammatory): Secondary | ICD-10-CM

## 2014-06-05 DIAGNOSIS — I50812 Chronic right heart failure: Secondary | ICD-10-CM

## 2014-06-05 DIAGNOSIS — I319 Disease of pericardium, unspecified: Secondary | ICD-10-CM

## 2014-06-05 DIAGNOSIS — I2721 Secondary pulmonary arterial hypertension: Secondary | ICD-10-CM

## 2014-06-05 DIAGNOSIS — I5022 Chronic systolic (congestive) heart failure: Secondary | ICD-10-CM

## 2014-06-05 DIAGNOSIS — I5033 Acute on chronic diastolic (congestive) heart failure: Secondary | ICD-10-CM | POA: Diagnosis not present

## 2014-06-05 DIAGNOSIS — I272 Other secondary pulmonary hypertension: Secondary | ICD-10-CM | POA: Diagnosis not present

## 2014-06-05 DIAGNOSIS — I313 Pericardial effusion (noninflammatory): Secondary | ICD-10-CM

## 2014-06-05 LAB — BASIC METABOLIC PANEL
ANION GAP: 12 (ref 5–15)
BUN: 59 mg/dL — AB (ref 6–23)
CALCIUM: 9.5 mg/dL (ref 8.4–10.5)
CHLORIDE: 104 mmol/L (ref 96–112)
CO2: 26 mmol/L (ref 19–32)
Creatinine, Ser: 1.53 mg/dL — ABNORMAL HIGH (ref 0.50–1.10)
GFR calc Af Amer: 41 mL/min — ABNORMAL LOW (ref >90)
GFR, EST NON AFRICAN AMERICAN: 35 mL/min — AB (ref >90)
Glucose, Bld: 54 mg/dL — ABNORMAL LOW (ref 70–99)
Potassium: 3.7 mmol/L (ref 3.5–5.1)
Sodium: 142 mmol/L (ref 135–145)

## 2014-06-05 NOTE — Patient Instructions (Signed)
Doing great!  Follow up 4 weeks.  Do the following things EVERYDAY: 1) Weigh yourself in the morning before breakfast. Write it down and keep it in a log. 2) Take your medicines as prescribed 3) Eat low salt foods-Limit salt (sodium) to 2000 mg per day.  4) Stay as active as you can everyday 5) Limit all fluids for the day to less than 2 liters

## 2014-06-05 NOTE — Progress Notes (Signed)
*  PRELIMINARY RESULTS* Echocardiogram 2D Echocardiogram has been performed.  [name] 06/05/2014, 8:33 AM

## 2014-06-05 NOTE — Progress Notes (Signed)
Patient ID: [name], female   DOB: 1951/01/04, 64 y.o.   MRN: [id number] Primary Cardiologist: Dr Haroldine Laws Pulmonary: Dr Lamonte Sakai   HPI: [name] is a 64 yo female with a history of DM2, HLD, HTN, pHTN, GERD, gastroparesis, scleroderma, DJD and diastolic HF.   Of note she has been on several PAH targeted therapies and coumadin over last 9 years, but has not been reliably on therapy due to noncompliance and inability to have labs performed. She has had best response to Tyvaso (+ bosentan) with an improvement in 6 minute walk and in PASP from 102 mmHg (7/09) to 42 mmHg (9/10). After stopping Tyvaso her PASP rose to 80's by TTE 6/13 and 11/14. Surprisingly, she has never required supplemental O2. Macitentan was started in 11/14. She developed nausea and diarrhea and macitentan was stopped early 3/15. Most recently, she had been on Letairis and Adcirca (followed by Dr. Lamonte Sakai).   Admitted 10/14-10/18/15 for A/C RHF, volume overload and syncope. Diuresed with IV lasix and milrinone a total of 26 lbs. Discharge weight was 156 lbs and started on lasix 40 mg BID.   Admitted 12/29 with dyspnea and chest pain. BP was low, and Lasix and Adcirca were both initially held. Echo showed EF 60-65% with moderately dilated and dysfunctional RV, PASP 111 mmHg with moderate TR. There was a large pericardial effusion without definite tamponade. Presentation was suspected to be due to progressive PAH with secondary RV failure rather than tamponade. No pericardiocentesis yet. She was started on milrinone gtt and diuresed. Shorter-acting sildenafil was started instead of Adcirca. Also had GI bleed. 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 Discharge weight was 146 pounds.   Echo (1/16) with EF 55-60%, severely dilated RV with moderately decreased systolic function, large pericardial effusion.   She was admitted in 2/16 with acute on chronic diastolic CHF/RV failure and  hypotension.  Echo (2/16) showed EF 60%, moderate-severe RV dilation with moderate-severe RV systolic dysfunction, PA systolic pressure 62 mmHg, large pericardial effusion with borderline tamponade.  She had pericardiocentesis with improvement in symptoms.  No malignant cells in pericardial fluid.  Repeat echo showed no pericardial effusion.  She developed paroxysmal atrial fibrillation on 2/19 but went back into NSR on amiodarone.  She was not anticoagulated given history of GI bleeding and pericardial effusion.   She returns for follow up. Mild dyspnea with exertion. Denies PND/Orthopnea.  Weight at home 138 pounds. Rarely dizzy.  Denies presyncope /syncope. Taking all medications. Using tyvaso 4 times daily (9 inhalations/episode) Just got over several days of diarrhea.    6 min walk (05/16/14): 125 m   Echo (1/16) with EF 55-60%, severely dilated RA with moderately decreased systolic function, large pericardial effusion.  ECHO 05/09/2014: EF 60-65% Peak PA pressure 52  ECHO 06/05/14: 55-60% RV moderately to severely HK. Severe RAE. RVSP 66. No effusion   RHC (1/16):  Hemodynamics (mmHg) RA mean 12 RV 64/14 PA 68/29, mean 42 PCWP mean 12 LV 102/14 AO 105/53 PA 67% AO 93% Cardiac Output (Fick) 6.63  Cardiac Index (Fick) 3.88  PVR 4.5 WU  Labs 03/24/2014 K 3.8 Creatinine 1.51 Hgb 9.3  Labs 2/16 BNP 121, K 4.4, creatinine 1.27, hgb 9.2 Labs 2/16 K 3.8, creatinine 1.58, AST 45, ALT 12 Labs 05/22/2014: Creatinine 2.1 K 3.7   ROS: All systems negative except as listed in HPI, PMH and Problem List.  SH:  History   Social History  . Marital Status:  Legally Separated    Spouse Name: N/A  . Number of Children: 2  . Years of Education: 11   Occupational History  . Umemployed   . Disability     Social History Main Topics  . Smoking status: Never Smoker   . Smokeless tobacco: Never Used  . Alcohol Use: No  . Drug Use: No  . Sexual Activity: Not on file   Other Topics Concern  .  Not on file   Social History Narrative    FAMILY HISTORY:  Significant for coronary artery disease and diabetes   Patient lives at home with granddaughter.    Patient has 2 children.    Patient has 11 years of education.    Patient is on disability.    Patient is right handed.    Patient is separated.      FH:  Family History  Problem Relation Age of Onset  . Heart disease Mother   . Diabetes Mother   . Diabetes Sister     Past Medical History  Diagnosis Date  . Secondary pulmonary hypertension     right heart cath 04/20/04  . Cough   . Allergic rhinitis, cause unspecified   . Esophageal reflux   . Systemic sclerosis   . Unspecified essential hypertension   . Gastritis   . Sickle cell trait     "trace"  . Obesity   . Visual changes   . Scleroderma   . Diastolic dysfunction   . Trichomonas   . Vaginal bleeding   . Neuropathy   . CHF (congestive heart failure)   . Type II diabetes mellitus     DIET CONTROL   . Bursitis   . History of blood transfusion     "think it was related to when I had partial hysteretomy; might have been for one of my knee ORs"  . Degeneration of lumbar or lumbosacral intervertebral disc   . Arthritis     "all over my body" (08/04/2013)  . Chronic kidney disease     "been in hospital for it; don't know what kind" (08/04/2013)    Current Outpatient Prescriptions  Medication Sig Dispense Refill  . ambrisentan (LETAIRIS) 10 MG tablet Take 1 tablet (10 mg total) by mouth daily. 30 tablet 11  . amiodarone (PACERONE) 200 MG tablet Take 1 tablet (200 mg total) by mouth daily. 30 tablet 6  . esomeprazole (NEXIUM) 40 MG capsule Take 40 mg by mouth 2 (two) times daily.     . fenofibrate 54 MG tablet Take 1 tablet (54 mg total) by mouth daily. 30 tablet 12  . ferrous sulfate ([employer] IRON) 325 (65 FE) MG tablet Take 325 mg by mouth daily with breakfast.    . FLUoxetine (PROZAC) 20 MG capsule take 1 capsule by mouth every morning 30 capsule 2  . furosemide  (LASIX) 80 MG tablet Take 1 tablet (80 mg total) by mouth 2 (two) times daily. 90 tablet 3  . gabapentin (NEURONTIN) 100 MG capsule Take 1 capsule (100 mg total) by mouth 2 (two) times daily. 60 capsule 11  . HYDROcodone-acetaminophen (NORCO) 10-325 MG per tablet Take 1 tablet by mouth every 6 (six) hours as needed for moderate pain.     . pregabalin (LYRICA) 150 MG capsule Take 1 capsule (150 mg total) by mouth 3 (three) times daily. 90 capsule 5  . promethazine (PHENERGAN) 12.5 MG tablet Take 1 tablet (12.5 mg total) by mouth every 6 (six) hours as needed for nausea  or vomiting. 45 tablet 1  . Tadalafil, PAH, (ADCIRCA) 20 MG TABS Take 2 tablets (40 mg total) by mouth daily. 60 tablet 6  . Treprostinil (TYVASO) 0.6 MG/ML SOLN Inhale 18 mcg into the lungs 4 (four) times daily.      No current facility-administered medications for this encounter.    Filed Vitals:   06/05/14 0856  BP: 82/40  Pulse: 54  Weight: 140 lb 12 oz (63.844 kg)  SpO2: 95%    PHYSICAL EXAM:  General: NAD Neck: JVP 7 cm, no thyromegaly or thyroid nodule.  Lungs: Decreased in the bases otherwise clean. .  CV: RV heave. Heart irregular S1/S2,  2/6 HSM LLSB. No peripheral edema.  Abdomen: Soft, nontender, no hepatosplenomegaly. Minimally distended.   Neurologic: Alert and oriented x 3.  Psych: Normal affect. Extremities: No clubbing or cyanosis.   ASSESSMENT & PLAN: 1. 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  Echo 04/2014 showed EF 60% with moderate-severely dilated and moderately dysfunctional RV. There was a large pericardial effusion with probable early tamponade.  She underwent pericardiocentesis. Todays ECHO EF 60% severely Enlarged R [facility]. RVSP 66. No pericardial effusion noted. This was reviewed and dicussed by Dr Haroldine Laws .  BP remains soft. Volumes status ok despite weight gain. Continue lasix 80 mg twice a day.  2. PAH with cor pulmonale: Severe PAH.  Had recent [place]  in 1/16.   - Continue ambrisentan 10 mg daily - Continue Tyvaso (she is compliant with this - could switch to selexapeg as needed)  - Continue Adcirca 40 mg daily.  3. CKD III:  4. 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  5. Pericardial effusion: Early tamponade on echo at last admission, s/p pericardiocentesis.  Todays ECHO with no effusion. .  6. Atrial fibrillation: Regular rhythm. Pararoxysmal.  Continue amiodarone. She is not anticoagulated due to h/o GI bleeding.  LFTS and TSH Check LFTs and TSH ok 05/22/2014. She  will need a yearly eye exam while on amiodarone.  Follow up in 4 wks.  CLEGG,AMY NP-C  06/05/2014  Patient seen and examined with Darrick Grinder, NP. We discussed all aspects of the encounter. I agree with the assessment and plan as stated above.   Echo reviewed personally. Pericardial effusion completely resolved. PAH is stable. Today is the best I have ever seen her. She is compliant with her PAH medications and I think this really makes a difference. The main question now is if she may be slightly overdiuresed in the setting of recent diarrhea. Will check labs today. Refuses Pulmonary rehab. HR seems irregular. Will get ECG.    Daniel Bensimhon,MD 10:04 AM

## 2014-06-05 NOTE — Addendum Note (Signed)
Encounter addended by: Effie Berkshire, RN on: 06/05/2014 10:13 AM<BR>     Documentation filed: Visit Diagnoses, Dx Association, Patient Instructions Section, Orders

## 2014-06-06 DIAGNOSIS — K3184 Gastroparesis: Secondary | ICD-10-CM | POA: Diagnosis not present

## 2014-06-06 DIAGNOSIS — I5030 Unspecified diastolic (congestive) heart failure: Secondary | ICD-10-CM | POA: Diagnosis not present

## 2014-06-06 DIAGNOSIS — E119 Type 2 diabetes mellitus without complications: Secondary | ICD-10-CM | POA: Diagnosis not present

## 2014-06-06 DIAGNOSIS — N183 Chronic kidney disease, stage 3 (moderate): Secondary | ICD-10-CM | POA: Diagnosis not present

## 2014-06-06 DIAGNOSIS — D649 Anemia, unspecified: Secondary | ICD-10-CM | POA: Diagnosis not present

## 2014-06-06 DIAGNOSIS — I129 Hypertensive chronic kidney disease with stage 1 through stage 4 chronic kidney disease, or unspecified chronic kidney disease: Secondary | ICD-10-CM | POA: Diagnosis not present

## 2014-06-06 NOTE — Addendum Note (Signed)
Encounter addended by: Yehuda Mao on: 06/06/2014  8:47 AM<BR>     Documentation filed: Charges VN

## 2014-06-07 DIAGNOSIS — I5030 Unspecified diastolic (congestive) heart failure: Secondary | ICD-10-CM | POA: Diagnosis not present

## 2014-06-07 DIAGNOSIS — I129 Hypertensive chronic kidney disease with stage 1 through stage 4 chronic kidney disease, or unspecified chronic kidney disease: Secondary | ICD-10-CM | POA: Diagnosis not present

## 2014-06-07 DIAGNOSIS — K3184 Gastroparesis: Secondary | ICD-10-CM | POA: Diagnosis not present

## 2014-06-07 DIAGNOSIS — D649 Anemia, unspecified: Secondary | ICD-10-CM | POA: Diagnosis not present

## 2014-06-07 DIAGNOSIS — E119 Type 2 diabetes mellitus without complications: Secondary | ICD-10-CM | POA: Diagnosis not present

## 2014-06-07 DIAGNOSIS — N183 Chronic kidney disease, stage 3 (moderate): Secondary | ICD-10-CM | POA: Diagnosis not present

## 2014-06-10 DIAGNOSIS — N183 Chronic kidney disease, stage 3 (moderate): Secondary | ICD-10-CM | POA: Diagnosis not present

## 2014-06-10 DIAGNOSIS — I5030 Unspecified diastolic (congestive) heart failure: Secondary | ICD-10-CM | POA: Diagnosis not present

## 2014-06-10 DIAGNOSIS — E119 Type 2 diabetes mellitus without complications: Secondary | ICD-10-CM | POA: Diagnosis not present

## 2014-06-10 DIAGNOSIS — D649 Anemia, unspecified: Secondary | ICD-10-CM | POA: Diagnosis not present

## 2014-06-10 DIAGNOSIS — I129 Hypertensive chronic kidney disease with stage 1 through stage 4 chronic kidney disease, or unspecified chronic kidney disease: Secondary | ICD-10-CM | POA: Diagnosis not present

## 2014-06-10 DIAGNOSIS — K3184 Gastroparesis: Secondary | ICD-10-CM | POA: Diagnosis not present

## 2014-06-12 DIAGNOSIS — I5030 Unspecified diastolic (congestive) heart failure: Secondary | ICD-10-CM | POA: Diagnosis not present

## 2014-06-12 DIAGNOSIS — D649 Anemia, unspecified: Secondary | ICD-10-CM | POA: Diagnosis not present

## 2014-06-12 DIAGNOSIS — E119 Type 2 diabetes mellitus without complications: Secondary | ICD-10-CM | POA: Diagnosis not present

## 2014-06-12 DIAGNOSIS — K3184 Gastroparesis: Secondary | ICD-10-CM | POA: Diagnosis not present

## 2014-06-12 DIAGNOSIS — N183 Chronic kidney disease, stage 3 (moderate): Secondary | ICD-10-CM | POA: Diagnosis not present

## 2014-06-12 DIAGNOSIS — I129 Hypertensive chronic kidney disease with stage 1 through stage 4 chronic kidney disease, or unspecified chronic kidney disease: Secondary | ICD-10-CM | POA: Diagnosis not present

## 2014-06-13 ENCOUNTER — Other Ambulatory Visit: Payer: Self-pay | Admitting: Emergency Medicine

## 2014-06-13 DIAGNOSIS — D649 Anemia, unspecified: Secondary | ICD-10-CM | POA: Diagnosis not present

## 2014-06-13 DIAGNOSIS — I5030 Unspecified diastolic (congestive) heart failure: Secondary | ICD-10-CM | POA: Diagnosis not present

## 2014-06-13 DIAGNOSIS — E119 Type 2 diabetes mellitus without complications: Secondary | ICD-10-CM | POA: Diagnosis not present

## 2014-06-13 DIAGNOSIS — I129 Hypertensive chronic kidney disease with stage 1 through stage 4 chronic kidney disease, or unspecified chronic kidney disease: Secondary | ICD-10-CM | POA: Diagnosis not present

## 2014-06-13 DIAGNOSIS — K3184 Gastroparesis: Secondary | ICD-10-CM | POA: Diagnosis not present

## 2014-06-13 DIAGNOSIS — N183 Chronic kidney disease, stage 3 (moderate): Secondary | ICD-10-CM | POA: Diagnosis not present

## 2014-06-19 DIAGNOSIS — D649 Anemia, unspecified: Secondary | ICD-10-CM | POA: Diagnosis not present

## 2014-06-19 DIAGNOSIS — E119 Type 2 diabetes mellitus without complications: Secondary | ICD-10-CM | POA: Diagnosis not present

## 2014-06-19 DIAGNOSIS — K3184 Gastroparesis: Secondary | ICD-10-CM | POA: Diagnosis not present

## 2014-06-19 DIAGNOSIS — I5030 Unspecified diastolic (congestive) heart failure: Secondary | ICD-10-CM | POA: Diagnosis not present

## 2014-06-19 DIAGNOSIS — I129 Hypertensive chronic kidney disease with stage 1 through stage 4 chronic kidney disease, or unspecified chronic kidney disease: Secondary | ICD-10-CM | POA: Diagnosis not present

## 2014-06-19 DIAGNOSIS — N183 Chronic kidney disease, stage 3 (moderate): Secondary | ICD-10-CM | POA: Diagnosis not present

## 2014-06-20 DIAGNOSIS — M359 Systemic involvement of connective tissue, unspecified: Secondary | ICD-10-CM | POA: Diagnosis not present

## 2014-06-20 DIAGNOSIS — E119 Type 2 diabetes mellitus without complications: Secondary | ICD-10-CM | POA: Diagnosis not present

## 2014-06-20 DIAGNOSIS — I272 Other secondary pulmonary hypertension: Secondary | ICD-10-CM | POA: Diagnosis not present

## 2014-06-20 DIAGNOSIS — D649 Anemia, unspecified: Secondary | ICD-10-CM | POA: Diagnosis not present

## 2014-06-20 DIAGNOSIS — I5030 Unspecified diastolic (congestive) heart failure: Secondary | ICD-10-CM | POA: Diagnosis not present

## 2014-06-20 DIAGNOSIS — I129 Hypertensive chronic kidney disease with stage 1 through stage 4 chronic kidney disease, or unspecified chronic kidney disease: Secondary | ICD-10-CM | POA: Diagnosis not present

## 2014-06-20 DIAGNOSIS — N183 Chronic kidney disease, stage 3 (moderate): Secondary | ICD-10-CM | POA: Diagnosis not present

## 2014-06-20 DIAGNOSIS — K3184 Gastroparesis: Secondary | ICD-10-CM | POA: Diagnosis not present

## 2014-06-26 DIAGNOSIS — D649 Anemia, unspecified: Secondary | ICD-10-CM | POA: Diagnosis not present

## 2014-06-26 DIAGNOSIS — N183 Chronic kidney disease, stage 3 (moderate): Secondary | ICD-10-CM | POA: Diagnosis not present

## 2014-06-26 DIAGNOSIS — I129 Hypertensive chronic kidney disease with stage 1 through stage 4 chronic kidney disease, or unspecified chronic kidney disease: Secondary | ICD-10-CM | POA: Diagnosis not present

## 2014-06-26 DIAGNOSIS — E119 Type 2 diabetes mellitus without complications: Secondary | ICD-10-CM | POA: Diagnosis not present

## 2014-06-26 DIAGNOSIS — K3184 Gastroparesis: Secondary | ICD-10-CM | POA: Diagnosis not present

## 2014-06-26 DIAGNOSIS — I5030 Unspecified diastolic (congestive) heart failure: Secondary | ICD-10-CM | POA: Diagnosis not present

## 2014-07-04 ENCOUNTER — Ambulatory Visit (HOSPITAL_COMMUNITY)
Admission: RE | Admit: 2014-07-04 | Discharge: 2014-07-04 | Disposition: A | Discharge status: Home / Self Care | Payer: Medicare Other | Source: Ambulatory Visit | Attending: Internal Medicine | Admitting: Internal Medicine

## 2014-07-04 ENCOUNTER — Encounter (HOSPITAL_COMMUNITY): Payer: Self-pay

## 2014-07-04 VITALS — BP 98/58 | HR 69 | Wt 141.5 lb

## 2014-07-04 DIAGNOSIS — Z833 Family history of diabetes mellitus: Secondary | ICD-10-CM | POA: Insufficient documentation

## 2014-07-04 DIAGNOSIS — I129 Hypertensive chronic kidney disease with stage 1 through stage 4 chronic kidney disease, or unspecified chronic kidney disease: Secondary | ICD-10-CM | POA: Insufficient documentation

## 2014-07-04 DIAGNOSIS — Z79899 Other long term (current) drug therapy: Secondary | ICD-10-CM | POA: Insufficient documentation

## 2014-07-04 DIAGNOSIS — I5032 Chronic diastolic (congestive) heart failure: Secondary | ICD-10-CM | POA: Diagnosis not present

## 2014-07-04 DIAGNOSIS — M349 Systemic sclerosis, unspecified: Secondary | ICD-10-CM | POA: Insufficient documentation

## 2014-07-04 DIAGNOSIS — I2781 Cor pulmonale (chronic): Secondary | ICD-10-CM | POA: Insufficient documentation

## 2014-07-04 DIAGNOSIS — E785 Hyperlipidemia, unspecified: Secondary | ICD-10-CM | POA: Insufficient documentation

## 2014-07-04 DIAGNOSIS — E119 Type 2 diabetes mellitus without complications: Secondary | ICD-10-CM | POA: Diagnosis not present

## 2014-07-04 DIAGNOSIS — I2721 Secondary pulmonary arterial hypertension: Secondary | ICD-10-CM

## 2014-07-04 DIAGNOSIS — N183 Chronic kidney disease, stage 3 (moderate): Secondary | ICD-10-CM | POA: Diagnosis not present

## 2014-07-04 DIAGNOSIS — I272 Other secondary pulmonary hypertension: Secondary | ICD-10-CM

## 2014-07-04 DIAGNOSIS — I509 Heart failure, unspecified: Secondary | ICD-10-CM | POA: Diagnosis not present

## 2014-07-04 DIAGNOSIS — Z8249 Family history of ischemic heart disease and other diseases of the circulatory system: Secondary | ICD-10-CM | POA: Diagnosis not present

## 2014-07-04 DIAGNOSIS — K219 Gastro-esophageal reflux disease without esophagitis: Secondary | ICD-10-CM | POA: Insufficient documentation

## 2014-07-04 DIAGNOSIS — K3184 Gastroparesis: Secondary | ICD-10-CM | POA: Insufficient documentation

## 2014-07-04 DIAGNOSIS — I480 Paroxysmal atrial fibrillation: Secondary | ICD-10-CM | POA: Insufficient documentation

## 2014-07-04 DIAGNOSIS — I50810 Right heart failure, unspecified: Secondary | ICD-10-CM

## 2014-07-04 MED ORDER — SELEXIPAG 200 MCG PO TABS: 1.0000 | ORAL_TABLET | Freq: Two times a day (BID) | ORAL | Status: DC

## 2014-07-04 NOTE — Addendum Note (Signed)
Encounter addended by: Effie Berkshire, RN on: 07/04/2014 10:17 AM<BR>     Documentation filed: Patient Instructions Section, Orders

## 2014-07-04 NOTE — Patient Instructions (Addendum)
You are in process of transitioning from Tyvaso to [place]. Once this medication has been approved for you, we will contact you and guide you on how to transition between these medications. Until then, keep doing your CURRENT medication regimen until we tell you otherwise.  Follow up 2 months.  Do the following things EVERYDAY: 1) Weigh yourself in the morning before breakfast. Write it down and keep it in a log. 2) Take your medicines as prescribed 3) Eat low salt foods-Limit salt (sodium) to 2000 mg per day.  4) Stay as active as you can everyday 5) Limit all fluids for the day to less than 2 liters

## 2014-07-04 NOTE — Progress Notes (Signed)
Patient ID: EMBRY MANRIQUE, female   DOB: [birth year], 64 y.o.   MRN: [id number] Primary Cardiologist: Dr Haroldine Laws Pulmonary: Dr Lamonte Sakai   HPI: Ms. Savary is a 64 yo female with a history of DM2, HLD, HTN, pHTN, GERD, gastroparesis, scleroderma, DJD and diastolic HF.   Of note she has been on several PAH targeted therapies and coumadin over last 9 years, but has not been reliably on therapy due to noncompliance and inability to have labs performed. She has had best response to Tyvaso (+ bosentan) with an improvement in 6 minute walk and in PASP from 102 mmHg (7/09) to 42 mmHg (9/10). After stopping Tyvaso her PASP rose to 80's by TTE 6/13 and 11/14. Surprisingly, she has never required supplemental O2. Macitentan was started in 11/14. She developed nausea and diarrhea and macitentan was stopped early 3/15. Most recently, she had been on Letairis and Adcirca (followed by Dr. Lamonte Sakai).   Admitted 10/14-10/18/15 for A/C RHF, volume overload and syncope. Diuresed with IV lasix and milrinone a total of 26 lbs. Discharge weight was 156 lbs and started on lasix 40 mg BID.   Admitted 12/29 with dyspnea and chest pain. BP was low, and Lasix and Adcirca were both initially held. Echo showed EF 60-65% with moderately dilated and dysfunctional RV, PASP 111 mmHg with moderate TR. There was a large pericardial effusion without definite tamponade. Presentation was suspected to be due to progressive PAH with secondary RV failure rather than tamponade. No pericardiocentesis yet. She was started on milrinone gtt and diuresed. Shorter-acting sildenafil was started instead of Adcirca. Also had GI bleed. 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 Discharge weight was 146 pounds.   Echo (1/16) with EF 55-60%, severely dilated RV with moderately decreased systolic function, large pericardial effusion.   She was admitted in 2/16 with acute on chronic diastolic CHF/RV failure and  hypotension.  Echo (2/16) showed EF 60%, moderate-severe RV dilation with moderate-severe RV systolic dysfunction, PA systolic pressure 62 mmHg, large pericardial effusion with borderline tamponade.  She had pericardiocentesis with improvement in symptoms.  No malignant cells in pericardial fluid.  Repeat echo showed no pericardial effusion.  She developed paroxysmal atrial fibrillation on 2/19 but went back into NSR on amiodarone.  She was not anticoagulated given history of GI bleeding and pericardial effusion.   She returns for follow up. Last visit we repeated echo. Pericardial effusion had not recurred. LVEF 55-60% RV  Moderately HK with RVSP 60. Feels ok. No real change. Mild dyspnea with exertion. Denies PND/Orthopnea.  Weight at home stable 138 pounds. Not having to take extra diuretics. Rarely dizzy.  Denies presyncope /syncope. Taking all medications. On Adcirca, letairis. Using tyvaso 4 times daily (9 inhalations/episode)   6 min walk (05/16/14): 125 m   Echo (1/16) with EF 55-60%, severely dilated RA with moderately decreased systolic function, large pericardial effusion.  ECHO 05/09/2014: EF 60-65% Peak PA pressure 52  ECHO 06/05/14: 55-60% RV moderately to severely HK. Severe RAE. RVSP 60. No effusion   RHC (1/16):  Hemodynamics (mmHg) RA mean 12 RV 64/14 PA 68/29, mean 42 PCWP mean 12 LV 102/14 AO 105/53 PA 67% AO 93% Cardiac Output (Fick) 6.63  Cardiac Index (Fick) 3.88  PVR 4.5 WU  Labs 03/24/2014 K 3.8 Creatinine 1.51 Hgb 9.3  Labs 2/16 BNP 121, K 4.4, creatinine 1.27, hgb 9.2 Labs 2/16 K 3.8, creatinine 1.58, AST 45, ALT 12 Labs 05/22/2014: Creatinine 2.1 K 3.7  Labs 06/05/2014: Creatinine 1.5 K 3.7   ROS: All systems negative except as listed in HPI, PMH and Problem List.  SH:  History   Social History  . Marital Status: Legally Separated    Spouse Name: N/A  . Number of Children: 2  . Years of Education: 11   Occupational History  . Umemployed   .  Disability     Social History Main Topics  . Smoking status: Never Smoker   . Smokeless tobacco: Never Used  . Alcohol Use: No  . Drug Use: No  . Sexual Activity: Not on file   Other Topics Concern  . Not on file   Social History Narrative    FAMILY HISTORY:  Significant for coronary artery disease and diabetes   Patient lives at home with granddaughter.    Patient has 2 children.    Patient has 11 years of education.    Patient is on disability.    Patient is right handed.    Patient is separated.      FH:  Family History  Problem Relation Age of Onset  . Heart disease Mother   . Diabetes Mother   . Diabetes Sister     Past Medical History  Diagnosis Date  . Secondary pulmonary hypertension     right heart cath 04/20/04  . Cough   . Allergic rhinitis, cause unspecified   . Esophageal reflux   . Systemic sclerosis   . Unspecified essential hypertension   . Gastritis   . Sickle cell trait     "trace"  . Obesity   . Visual changes   . Scleroderma   . Diastolic dysfunction   . Trichomonas   . Vaginal bleeding   . Neuropathy   . CHF (congestive heart failure)   . Type II diabetes mellitus     DIET CONTROL   . Bursitis   . History of blood transfusion     "think it was related to when I had partial hysteretomy; might have been for one of my knee ORs"  . Degeneration of lumbar or lumbosacral intervertebral disc   . Arthritis     "all over my body" (08/04/2013)  . Chronic kidney disease     "been in hospital for it; don't know what kind" (08/04/2013)    Current Outpatient Prescriptions  Medication Sig Dispense Refill  . ADCIRCA 20 MG TABS TAKE 2 TABLETS (40MG) BY MOUTH ONCE DAILY WITH OR WITHOUT FOOD. CALL [phone] FOR REFILLS. 60 tablet 11  . ambrisentan (LETAIRIS) 10 MG tablet Take 1 tablet (10 mg total) by mouth daily. 30 tablet 11  . amiodarone (PACERONE) 200 MG tablet Take 1 tablet (200 mg total) by mouth daily. 30 tablet 6  . esomeprazole (NEXIUM) 40  MG capsule Take 40 mg by mouth 2 (two) times daily.     . fenofibrate 54 MG tablet Take 1 tablet (54 mg total) by mouth daily. 30 tablet 12  . ferrous sulfate ([employer] IRON) 325 (65 FE) MG tablet Take 325 mg by mouth daily with breakfast.    . FLUoxetine (PROZAC) 20 MG capsule take 1 capsule by mouth every morning 30 capsule 2  . furosemide (LASIX) 80 MG tablet Take 1 tablet (80 mg total) by mouth 2 (two) times daily. 90 tablet 3  . gabapentin (NEURONTIN) 100 MG capsule Take 1 capsule (100 mg total) by mouth 2 (two) times daily. 60 capsule 11  . HYDROcodone-acetaminophen (NORCO) 10-325 MG per tablet Take 1 tablet by  mouth every 6 (six) hours as needed for moderate pain.     . pregabalin (LYRICA) 150 MG capsule Take 1 capsule (150 mg total) by mouth 3 (three) times daily. 90 capsule 5  . promethazine (PHENERGAN) 12.5 MG tablet Take 1 tablet (12.5 mg total) by mouth every 6 (six) hours as needed for nausea or vomiting. 45 tablet 1  . Treprostinil (TYVASO) 0.6 MG/ML SOLN Inhale 18 mcg into the lungs 4 (four) times daily.      No current facility-administered medications for this encounter.    Filed Vitals:   07/04/14 0926  BP: 98/58  Pulse: 69  Weight: 141 lb 8 oz (64.184 kg)  SpO2: 95%    PHYSICAL EXAM:  General: NAD Neck: JVP 7 cm, no thyromegaly or thyroid nodule.  Lungs: Decreased in the bases otherwise clean. .  CV: RV heave. Heart irregular S1/S2,  2/6 HSM LLSB. Trace peripheral edema.  Abdomen: Soft, nontender, no hepatosplenomegaly. Minimally distended.   Neurologic: Alert and oriented x 3.  Psych: Normal affect. Extremities: No clubbing or cyanosis.   ASSESSMENT & PLAN: 1. 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  Echo 05/2014 showed EF 60% with moderate-severely dilated and moderately dysfunctional RV. There was a large pericardial effusion with probable early tamponade.  She underwent pericardiocentesis.  - Doing well on triple therapy.  Volume status ok. Reinforced need for daily weights and reviewed use of sliding scale diuretics. - Wants to switch from Tyvaso to selexapeg for ease of use 2. PAH with cor pulmonale: Severe PAH.  Had recent [place] in 1/16.   - Continue ambrisentan 10 mg daily - Wants to switch from Tyvaso to selexapeg for ease of use - Continue Adcirca 40 mg daily.  - If remains complaint at some point may be candidate for IV therapies.  3. CKD III:  4. 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  5. Pericardial effusion: Resolved after pericardiocentesis. Repeat echo in 3-6 months, 6. Atrial fibrillation: Regular rhythm. Pararoxysmal.  Continue amiodarone. She is not anticoagulated due to h/o GI bleeding.  LFTS and TSH Check LFTs and TSH ok 05/22/2014. She  will need a yearly eye exam while on amiodarone.  Follow up in 2 month  Glori Bickers MD  07/04/2014  Patient seen and examined with Darrick Grinder, NP. We discussed all aspects of the encounter. I agree with the assessment and plan as stated above.   Echo reviewed personally. Pericardial effusion completely resolved. PAH is stable. Today is the best I have ever seen her. She is compliant with her PAH medications and I think this really makes a difference. The main question now is if she may be slightly overdiuresed in the setting of recent diarrhea. Will check labs today. Refuses Pulmonary rehab. HR seems irregular. Will get ECG.    Kaedynce Tapp,MD 9:46 AM

## 2014-07-18 DIAGNOSIS — I272 Other secondary pulmonary hypertension: Secondary | ICD-10-CM | POA: Diagnosis not present

## 2014-07-18 DIAGNOSIS — M359 Systemic involvement of connective tissue, unspecified: Secondary | ICD-10-CM | POA: Diagnosis not present

## 2014-07-19 ENCOUNTER — Telehealth (HOSPITAL_COMMUNITY): Payer: Self-pay | Admitting: *Deleted

## 2014-07-19 NOTE — Telephone Encounter (Signed)
Completed PA for pt's Uptravi (selexipag), med was approved through 03/15/15, [employer] is aware

## 2014-07-23 ENCOUNTER — Telehealth: Payer: Self-pay

## 2014-07-23 NOTE — Telephone Encounter (Signed)
Called patient to inform Rx ready for pick up at front desk. No answer.

## 2014-07-23 NOTE — Telephone Encounter (Signed)
Received fax from [place] [employer] regarding prior auth for Uptravi. She is a patient at the [facility] failure [facility], so I have faxed this form to them.

## 2014-08-07 ENCOUNTER — Other Ambulatory Visit: Payer: Self-pay | Admitting: Internal Medicine

## 2014-08-16 DIAGNOSIS — I272 Other secondary pulmonary hypertension: Secondary | ICD-10-CM | POA: Diagnosis not present

## 2014-08-16 DIAGNOSIS — M359 Systemic involvement of connective tissue, unspecified: Secondary | ICD-10-CM | POA: Diagnosis not present

## 2014-08-19 ENCOUNTER — Ambulatory Visit (INDEPENDENT_AMBULATORY_CARE_PROVIDER_SITE_OTHER): Payer: Medicare Other | Admitting: Neurology

## 2014-08-19 ENCOUNTER — Encounter: Payer: Self-pay | Admitting: Neurology

## 2014-08-19 VITALS — BP 80/49 | HR 75 | Ht 64.0 in | Wt 146.0 lb

## 2014-08-19 DIAGNOSIS — R32 Unspecified urinary incontinence: Secondary | ICD-10-CM | POA: Diagnosis not present

## 2014-08-19 DIAGNOSIS — R269 Unspecified abnormalities of gait and mobility: Secondary | ICD-10-CM | POA: Diagnosis not present

## 2014-08-19 DIAGNOSIS — G629 Polyneuropathy, unspecified: Secondary | ICD-10-CM

## 2014-08-19 DIAGNOSIS — E1142 Type 2 diabetes mellitus with diabetic polyneuropathy: Secondary | ICD-10-CM | POA: Diagnosis not present

## 2014-08-19 DIAGNOSIS — R209 Unspecified disturbances of skin sensation: Secondary | ICD-10-CM

## 2014-08-19 MED ORDER — NORTRIPTYLINE HCL 10 MG PO CAPS: ORAL_CAPSULE | ORAL | Status: DC

## 2014-08-19 MED ORDER — PREGABALIN 150 MG PO CAPS: 150.0000 mg | ORAL_CAPSULE | Freq: Three times a day (TID) | ORAL | Status: DC

## 2014-08-19 NOTE — Progress Notes (Signed)
Chief Complaint  Patient presents with  . Abnormal Gait    Feels her gait has not worsened and she is still able to ambulate without assistance.  Reports having one fall that did not result in any injury.  She has had several hospital admissions related to her heart and low blood pressure.  . Peripheral Neuropathy    Says her symptoms are tolerable on current medications.     [facility]  PATIENT: [name] DOB: 12/28/1950   REASON FOR VISIT: Followup for peripheral neuropathy   HISTORY OF PRESENT ILLNESS: [name], 64 year old female returns for followup diabetic peripheral neuropathy, last clinical visit was with Hoyle Sauer in May 2015   She presented in 2005 with bilateral feet paresthesias which preceded her diagnosis of type 2 diabetes.  EMG nerve conduction study in 2006 was normal there was no evidence of large fiber peripheral neuropathy.   Her symptoms have progressed over the years, she complains of bilateral feet paresthesia, she is currently on Lyrica 150 mg three times  daily and gabapentin 100 twice a day.   She has a history of scleroderma and  is on low-dose prednisone. She had a recent hospital admission for hypotension due to nausea and vomiting. Her Lasix was reduced.   UPDATE June 6th 2016: She is now complains of worsening bilateral feet paresthesia, also involving bilateral fingertips, difficulty making a tight grip, worsening gait difficulty, neck pain, almost daily bowel accident for 2 years,  She is taking Lyrica 150 mg 3 times a day, along with low-dose gabapentin, still complains of bilateral feet paresthesia, difficulty sleeping at nighttime.   REVIEW OF SYSTEMS: Full 14 system review of systems performed and notable only for those listed, all others are neg: Shortness of breath, leg swelling      ALLERGIES: Allergies  Allergen Reactions  . Cephalexin Rash  . Ciprofloxacin Rash  . Codeine Other (See Comments)    REACTION: GI  upset  . Contrast Media [Iodinated Diagnostic Agents] Hives  . Iohexol Hives     Code: HIVES, Desc: pt breaks out in large hives. needs full premeds, Onset Date: [id number]     HOME MEDICATIONS: Outpatient Prescriptions Prior to Visit  Medication Sig Dispense Refill  . ADCIRCA 20 MG TABS TAKE 2 TABLETS (40MG) BY MOUTH ONCE DAILY WITH OR WITHOUT FOOD. CALL [phone] FOR REFILLS. 60 tablet 11  . ambrisentan (LETAIRIS) 10 MG tablet Take 1 tablet (10 mg total) by mouth daily. 30 tablet 11  . amiodarone (PACERONE) 200 MG tablet Take 1 tablet (200 mg total) by mouth daily. 30 tablet 6  . esomeprazole (NEXIUM) 40 MG capsule Take 40 mg by mouth 2 (two) times daily.     . fenofibrate 54 MG tablet Take 1 tablet (54 mg total) by mouth daily. 30 tablet 12  . ferrous sulfate ([facility] IRON) 325 (65 FE) MG tablet Take 325 mg by mouth daily with breakfast.    . FLUoxetine (PROZAC) 20 MG capsule take 1 capsule by mouth every morning 30 capsule 2  . furosemide (LASIX) 80 MG tablet Take 1 tablet (80 mg total) by mouth 2 (two) times daily. 90 tablet 3  . gabapentin (NEURONTIN) 100 MG capsule Take 1 capsule (100 mg total) by mouth 2 (two) times daily. 60 capsule 11  . HYDROcodone-acetaminophen (NORCO) 10-325 MG per tablet Take 1 tablet by mouth every 6 (six) hours as needed for moderate pain.     . pregabalin (LYRICA) 150 MG capsule Take 1 capsule (  150 mg total) by mouth 3 (three) times daily. 90 capsule 5  . promethazine (PHENERGAN) 12.5 MG tablet Take 1 tablet (12.5 mg total) by mouth every 6 (six) hours as needed for nausea or vomiting. 45 tablet 1  . Selexipag 200 MCG TABS Take 1 tablet by mouth 2 (two) times daily. 60 tablet 0  . Treprostinil (TYVASO) 0.6 MG/ML SOLN Inhale 18 mcg into the lungs 4 (four) times daily.      No facility-administered medications prior to visit.    PAST MEDICAL HISTORY: Past Medical History  Diagnosis Date  . Secondary pulmonary hypertension     right heart cath 04/20/04  .  Cough   . Allergic rhinitis, cause unspecified   . Esophageal reflux   . Systemic sclerosis   . Unspecified essential hypertension   . Gastritis   . Sickle cell trait     "trace"  . Obesity   . Visual changes   . Scleroderma   . Diastolic dysfunction   . Trichomonas   . Vaginal bleeding   . Neuropathy   . CHF (congestive heart failure)   . Type II diabetes mellitus     DIET CONTROL   . Bursitis   . History of blood transfusion     "think it was related to when I had partial hysteretomy; might have been for one of my knee ORs"  . Degeneration of lumbar or lumbosacral intervertebral disc   . Arthritis     "all over my body" (08/04/2013)  . Chronic kidney disease     "been in hospital for it; don't know what kind" (08/04/2013)    PAST SURGICAL HISTORY: Past Surgical History  Procedure Laterality Date  . Tubal ligation    . Esophagogastroduodenoscopy  02/23/2010    multiple  . Shoulder arthroscopy Right 12/2009    subacromial decompression  . Replacement total knee Left 11/2000  . Cardiac catheterization Right 04/24/2004  . Tonsillectomy  1960's  . Abdominal hysterectomy  1983    "partial"  . Joint replacement    . Shoulder arthroscopy w/ rotator cuff repair Right 01/2010  . Excisional total knee arthroplasty with antibiotic spacers Left 08/2010    "got infected & had to take 1st replacement out"  . Revision total knee arthroplasty Left 11/2010    "removed spacers; replaced knee"  . Total knee revision with scar debridement/patella revision with poly exchange Left 12/2010    fell; knee split opened; had to redo revision"  . Cardiac catheterization Left 07/2010  . Peripherally inserted central catheter insertion  09/2010  . Colonoscopy  09/06/2008    normal----Dr. Lajoyce Corners   . Right heart catheterization N/A 03/18/2014    Procedure: RIGHT HEART CATH;  Surgeon: Larey Dresser, MD;  Location: [facility] CATH LAB;  Service: Cardiovascular;  Laterality: N/A;  . Esophagogastroduodenoscopy N/A  03/23/2014    Procedure: ESOPHAGOGASTRODUODENOSCOPY (EGD);  Surgeon: Ladene Artist, MD;  Location: [facility] ENDOSCOPY;  Service: Endoscopy;  Laterality: N/A;  . Pericardial tap N/A 05/03/2014    Procedure: PERICARDIAL TAP;  Surgeon: Sinclair Grooms, MD;  Location[facility] CATH LAB;  Service: Cardiovascular;  Laterality: N/A;    FAMILY HISTORY: Family History  Problem Relation Age of Onset  . Heart disease Mother   . Diabetes Mother   . Diabetes Sister     SOCIAL HISTORY: History   Social History  . Marital Status: Legally Separated    Spouse Name: N/A  . Number of Children: 2  . Years of  Education: 11   Occupational History  . Umemployed   . Disability     Social History Main Topics  . Smoking status: Never Smoker   . Smokeless tobacco: Never Used  . Alcohol Use: No  . Drug Use: No  . Sexual Activity: Not on file   Other Topics Concern  . Not on file   Social History Narrative    FAMILY HISTORY:  Significant for coronary artery disease and diabetes   Patient lives at home with granddaughter.    Patient has 2 children.    Patient has 11 years of education.    Patient is on disability.    Patient is right handed.    Patient is separated.       PHYSICAL EXAM  Filed Vitals:   08/19/14 1020  BP: 80/49  Pulse: 75  Height: _0  (1.626 m)  Weight: 146 lb (66.225 kg)   Body mass index is 25.05 kg/(m^2). PHYSICAL EXAMNIATION:  Gen: NAD, conversant, well nourised, obese, well groomed                     Cardiovascular: Regular rate rhythm, no peripheral edema, warm, nontender. Eyes: Conjunctivae clear without exudates or hemorrhage Neck: Supple, no carotid bruise. Pulmonary: Clear to auscultation bilaterally   NEUROLOGICAL EXAM:  MENTAL STATUS: Speech/Cognition:    Speech is normal; fluent and spontaneous with normal comprehension. Cooperative on examinations   CRANIAL NERVES: CN II: Visual fields are full to confrontation. Fundoscopic exam is normal with sharp  discs and no vascular changes. Venous pulsations are present bilaterally. Pupils are 4 mm and briskly reactive to light. Visual acuity is 20/20 bilaterally. CN III, IV, VI: extraocular movement are normal. No ptosis. CN V: Facial sensation is intact to pinprick in all 3 divisions bilaterally. Corneal responses are intact.  CN VII: Face is symmetric with normal eye closure and smile. CN VIII: Hearing is normal to rubbing fingers CN IX, X: Palate elevates symmetrically. Phonation is normal. CN XI: Head turning and shoulder shrug are intact CN XII: Tongue is midline with normal movements and no atrophy.  MOTOR: There is no pronator drift of out-stretched arms. Muscle bulk and tone are normal. Muscle strength is normal.  REFLEXES: Reflexes are 2+ and symmetric at the biceps, triceps, knees, and absent absent at ankles. Plantar responses are flexor.  SENSORY: Length dependent decreased light touch, pinprick to distal leg, preserved toe position sense, and vibration sensation. COORDINATION: Rapid alternating movements and fine finger movements are intact. There is no dysmetria on finger-to-nose and heel-knee-shin. There are no abnormal or extraneous movements.   GAIT/STANCE: Stiff, cautious, mildly unsteady gait    DIAGNOSTIC DATA (LABS, IMAGING, TESTING) - I reviewed patient records, labs, notes, testing and imaging myself where available.  Lab Results  Component Value Date   WBC 6.1 05/08/2014   HGB 8.8* 05/08/2014   HCT 27.2* 05/08/2014   MCV 80.0 05/08/2014   PLT 184 05/08/2014      Component Value Date/Time   NA 142 06/05/2014 1129   K 3.7 06/05/2014 1129   CL 104 06/05/2014 1129   CO2 26 06/05/2014 1129   GLUCOSE 54* 06/05/2014 1129   BUN 59* 06/05/2014 1129   CREATININE 1.53* 06/05/2014 1129   CREATININE 1.43* 02/27/2014 1209   CALCIUM 9.5 06/05/2014 1129   PROT 8.9* 05/16/2014 1100   ALBUMIN 3.7 05/16/2014 1100   AST 28 05/16/2014 1100   ALT 11 05/16/2014 1100    ALKPHOS 121* 05/16/2014  1100   BILITOT 0.8 05/16/2014 1100   GFRNONAA 35* 06/05/2014 1129   GFRNONAA 39* 02/27/2014 1209   GFRAA 41* 06/05/2014 1129   GFRAA 45* 02/27/2014 1209   Lab Results  Component Value Date   CHOL 98 04/16/2013   HDL 31* 04/16/2013   LDLCALC 48 04/16/2013   LDLDIRECT 62 12/09/2011   TRIG 93 04/16/2013   CHOLHDL 3.2 04/16/2013   Lab Results  Component Value Date   HGBA1C 5.3 12/26/2013    Lab Results  Component Value Date   TSH 8.796* 05/16/2014    ASSESSMENT AND PLAN  64 y.o. year old female  has a past medical history of small fiber neuropathy due to diabetes.   1. Continue Lyrica 130m tid. 2. Add on Nortriptyline 155m2 tabs p oqhs 3. She has gradually worsening gait difficulty, bowel incontinence daily, neck pain, hyperreflexia,  right more than left on examinations, need to rule out cervical myelopathy, proceed with MRI of cervical spine, EMG nerve conduction study  YiMarcial PacasM.D. Ph.D.  [facility] [id number]: [phone]:      [phone]

## 2014-08-21 ENCOUNTER — Ambulatory Visit: Payer: Medicare Other | Admitting: Internal Medicine

## 2014-08-22 ENCOUNTER — Ambulatory Visit (INDEPENDENT_AMBULATORY_CARE_PROVIDER_SITE_OTHER): Payer: Medicare Other | Admitting: Internal Medicine

## 2014-08-22 ENCOUNTER — Encounter: Payer: Self-pay | Admitting: Internal Medicine

## 2014-08-22 VITALS — BP 77/42 | HR 76 | Temp 98.0°F | Ht 64.0 in | Wt 144.8 lb

## 2014-08-22 DIAGNOSIS — IMO0001 Reserved for inherently not codable concepts without codable children: Secondary | ICD-10-CM | POA: Insufficient documentation

## 2014-08-22 DIAGNOSIS — L03011 Cellulitis of right finger: Secondary | ICD-10-CM

## 2014-08-22 DIAGNOSIS — B9689 Other specified bacterial agents as the cause of diseases classified elsewhere: Secondary | ICD-10-CM | POA: Diagnosis not present

## 2014-08-22 MED ORDER — DOXYCYCLINE HYCLATE 100 MG PO TABS: 100.0000 mg | ORAL_TABLET | Freq: Two times a day (BID) | ORAL | Status: DC

## 2014-08-22 NOTE — Assessment & Plan Note (Signed)
Assessment: Most likely diagnosis is paronychia. She lacks signs of systemic infection.  Plan: 1. Labs/imaging: none 2. Therapy: Doxycycline 100 mg twice a day for 10 days. Recommended that she soaks her finger in warm saltwater several times a day. This will help facilitate drainage. She will continue with her current Norco for her pain 3. Follow up: follow-up in one week if there is no improvement, may consider referral to hand surgery for I&D.

## 2014-08-22 NOTE — Progress Notes (Signed)
Patient ID: [name], female   DOB: 03/21/1950, 64 y.o.   MRN: [id number]   Subjective:   HPI: [name] is a 64 y.o. woman with complex past medical history as detailed below presents for an acute visit.  Reason(s) for visit:  Pain and swelling of her second right finger: Patient reports that about 2 months ago, she notes a small spot on her right second middle finger at the tip. She never paid much attention to it as it was not bothering her. However, over the next few weeks, the finger started getting slightly swollen and most recently in the past few days, it has been more painful. She rates her pain as a 6/10, sharp, constant and relieved by her pain medications which she takes for back pain. She denies any fevers, chills, or increased nausea, vomiting. However, she does have chronic fatigue.    Past Medical History  Diagnosis Date  . Secondary pulmonary hypertension     right heart cath 04/20/04  . Cough   . Allergic rhinitis, cause unspecified   . Esophageal reflux   . Systemic sclerosis   . Unspecified essential hypertension   . Gastritis   . Sickle cell trait     "trace"  . Obesity   . Visual changes   . Scleroderma   . Diastolic dysfunction   . Trichomonas   . Vaginal bleeding   . Neuropathy   . CHF (congestive heart failure)   . Type II diabetes mellitus     DIET CONTROL   . Bursitis   . History of blood transfusion     "think it was related to when I had partial hysteretomy; might have been for one of my knee ORs"  . Degeneration of lumbar or lumbosacral intervertebral disc   . Arthritis     "all over my body" (08/04/2013)  . Chronic kidney disease     "been in hospital for it; don't know what kind" (08/04/2013)    ROS: Constitutional:  Denies fevers, chills, diaphoresis, appetite change and fatigue.  Respiratory: Denies SOB, DOE, cough, chest tightness, and wheezing.  CVS: No chest pain, palpitations and leg swelling.  GI: No abdominal pain, nausea,  vomiting, bloody stools GU: No dysuria, frequency, hematuria, or flank pain.  MSK: No myalgias, back pain, joint swelling, arthralgias  Psych: No depression symptoms. No SI or SA.    Objective:  Physical Exam: Filed Vitals:   08/22/14 1036  BP: 77/42  Pulse: 76  Temp: 98 F (36.7 C)  TempSrc: Oral  Height: _0  (1.626 m)  Weight: 144 lb 12.8 oz (65.681 kg)  SpO2: 94%   General: skinny woman, chronically ill but not toxic appearing. No acute distress.  HEENT: Normal oral mucosa. MMM.  Lungs: CTA bilaterally. No wheezing. Heart: RRR; no extra sounds or murmurs  Abdomen: Non-distended, normal bowel sounds, soft, nontender; no hepatosplenomegaly  Extremities: No pedal edema. No joint swelling or tenderness. Right second finger: Swelling on the right lateral side of the nail with a small opening which has a drop of clear fluid, mostly involving the palmar aspect. Slight foul smell is noted. Tenderness that goes up to the level of the middle interphalangeal joint. However, she has no limitation in the range of motion in any of her interphalangeal joints. Neurologic: Normal EOM,  Alert and oriented x3. No obvious neurologic/cranial nerve deficits.  Assessment & Plan:  Discussed case with Dr Beryle Beams See problem based charting for assessment and plan.

## 2014-08-22 NOTE — Progress Notes (Signed)
Medicine attending: Medical history, presenting problems, physical findings, and medications, reviewed with Dr Jessee Avers and I concur with his evaluation and management plan.

## 2014-08-22 NOTE — Patient Instructions (Signed)
General Instructions: Please take Doxycycline 100 mg twice a day for 10 days  Please soak finger in warm salty water for about 2-3 hours three time a day If your pain gets worse come back to be see here  Otherwise come back on Tuesday  Please bring your medicines with you each time you come to clinic.  Medicines may include prescription medications, over-the-counter medications, herbal remedies, eye drops, vitamins, or other pills.   Progress Toward Treatment Goals:  Treatment Goal 02/27/2014  Hemoglobin A1C at goal  Blood pressure unable to assess  Prevent falls unchanged    Self Care Goals & Plans:  Self Care Goal 02/27/2014  Manage my medications bring my medications to every visit; refill my medications on time; take my medicines as prescribed  Monitor my health keep track of my blood glucose; bring my glucose meter and log to each visit  Eat healthy foods eat more vegetables; eat foods that are low in salt; eat baked foods instead of fried foods  Be physically active -  Prevent falls wear appropriate shoes    Home Blood Glucose Monitoring 08/22/2013  Check my blood sugar once a day  When to check my blood sugar -     Care Management & Community Referrals:  Referral 04/16/2013  Referrals made for care management support none needed  Referrals made to community resources -     Fingertip Infection When an infection is around the nail, it is called a paronychia. When it appears over the tip of the finger, it is called a felon. These infections are due to minor injuries or cracks in the skin. If they are not treated properly, they can lead to bone infection and permanent damage to the fingernail. Incision and drainage is necessary if a pus pocket (an abscess) has formed. Antibiotics and pain medicine may also be needed. Keep your hand elevated for the next 2-3 days to reduce swelling and pain. If a pack was placed in the abscess, it should be removed in 1-2 days by your  caregiver. Soak the finger in warm water for 20 minutes 4 times daily to help promote drainage. Keep the hands as dry as possible. Wear protective gloves with cotton liners. See your caregiver for follow-up care as recommended.  HOME CARE INSTRUCTIONS   Keep wound clean, dry and dressed as suggested by your caregiver.  Soak in warm salt water for fifteen minutes, four times per day for bacterial infections.  Your caregiver will prescribe an antibiotic if a bacterial infection is suspected. Take antibiotics as directed and finish the prescription, even if the problem appears to be improving before the medicine is gone.  Only take over-the-counter or prescription medicines for pain, discomfort, or fever as directed by your caregiver. SEEK IMMEDIATE MEDICAL CARE IF:  There is redness, swelling, or increasing pain in the wound.  Pus or any other unusual drainage is coming from the wound.  An unexplained oral temperature above 102 F (38.9 C) develops.  You notice a foul smell coming from the wound or dressing. MAKE SURE YOU:   Understand these instructions.  Monitor your condition.  Contact your caregiver if you are getting worse or not improving. Document Released: 04/08/2004 Document Revised: 05/24/2011 Document Reviewed: 04/04/2008 [facility] Patient Information 2015 [place], [place]. This information is not intended to replace advice given to you by your health care provider. Make sure you discuss any questions you have with your health care provider.

## 2014-08-29 ENCOUNTER — Ambulatory Visit (INDEPENDENT_AMBULATORY_CARE_PROVIDER_SITE_OTHER): Payer: Self-pay | Admitting: Neurology

## 2014-08-29 ENCOUNTER — Ambulatory Visit (INDEPENDENT_AMBULATORY_CARE_PROVIDER_SITE_OTHER): Payer: Medicare Other | Admitting: Neurology

## 2014-08-29 DIAGNOSIS — R32 Unspecified urinary incontinence: Secondary | ICD-10-CM

## 2014-08-29 DIAGNOSIS — R202 Paresthesia of skin: Secondary | ICD-10-CM

## 2014-08-29 DIAGNOSIS — E1142 Type 2 diabetes mellitus with diabetic polyneuropathy: Secondary | ICD-10-CM

## 2014-08-29 DIAGNOSIS — G629 Polyneuropathy, unspecified: Secondary | ICD-10-CM

## 2014-08-29 DIAGNOSIS — R269 Unspecified abnormalities of gait and mobility: Secondary | ICD-10-CM

## 2014-08-29 DIAGNOSIS — R209 Unspecified disturbances of skin sensation: Secondary | ICD-10-CM

## 2014-08-29 DIAGNOSIS — Z0289 Encounter for other administrative examinations: Secondary | ICD-10-CM

## 2014-08-29 NOTE — Progress Notes (Signed)
Electrodiagnostic study today showed mild axonal peripheral neuropathy

## 2014-08-29 NOTE — Procedures (Signed)
NCS (NERVE CONDUCTION STUDY) WITH EMG (ELECTROMYOGRAPHY) REPORT   STUDY DATE: August 29 2014 PATIENT NAME: [name] DOB: March 01, 1951 MRN: [id number]    TECHNOLOGIST: Laretta Alstrom ELECTROMYOGRAPHER: Marcial Pacas M.D.  CLINICAL INFORMATION:  64 years old female, with type 2 diabetes, bilateral feet paresthesia, worsening gait difficulty  FINDINGS: NERVE CONDUCTION STUDY: Bilateral peroneal sensory responses were absent. Right peroneal to EDB, bilateral tibial motor responses showed severely decreased C map amplitude, with normal conduction velocity, distal latency. Left peroneal to EDB motor response showed mildly decreased to C map amplitude, with normal distal latency, conduction velocity. Bilateral tibial H reflexes were absent.  Right median, ulnar responses were normal.    NEEDLE ELECTROMYOGRAPHY: Selective needle examinations were performed at right lower extremity muscles, right lumbosacral paraspinal muscles.   Needle examination of right tibialis anterior, tibialis posterior, medial gastrocnemius, vastus lateralis, biceps femoris short head was normal.  There was no spontaneous activity at right lumbosacral paraspinal muscles, right L4, L5, S1.  IMPRESSION:  This is a mild abnormal study. 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 There is no evidence of right lumbosacral radiculopathy.   INTERPRETING PHYSICIAN:   Marcial Pacas M.D. Ph.D. [facility] [address] [place], [place] [address] [phone]

## 2014-09-03 ENCOUNTER — Ambulatory Visit
Admission: RE | Admit: 2014-09-03 | Discharge: 2014-09-03 | Disposition: A | Discharge status: Home / Self Care | Payer: Medicare Other | Source: Ambulatory Visit | Attending: Neurology | Admitting: Neurology

## 2014-09-03 DIAGNOSIS — R32 Unspecified urinary incontinence: Secondary | ICD-10-CM

## 2014-09-03 DIAGNOSIS — E1142 Type 2 diabetes mellitus with diabetic polyneuropathy: Secondary | ICD-10-CM | POA: Diagnosis not present

## 2014-09-03 DIAGNOSIS — R209 Unspecified disturbances of skin sensation: Secondary | ICD-10-CM | POA: Diagnosis not present

## 2014-09-03 DIAGNOSIS — R269 Unspecified abnormalities of gait and mobility: Secondary | ICD-10-CM | POA: Diagnosis not present

## 2014-09-03 DIAGNOSIS — G629 Polyneuropathy, unspecified: Secondary | ICD-10-CM | POA: Diagnosis not present

## 2014-09-04 ENCOUNTER — Encounter: Payer: Self-pay | Admitting: Internal Medicine

## 2014-09-04 ENCOUNTER — Ambulatory Visit (INDEPENDENT_AMBULATORY_CARE_PROVIDER_SITE_OTHER): Payer: Medicare Other | Admitting: Internal Medicine

## 2014-09-04 VITALS — BP 85/46 | HR 54 | Temp 98.2°F | Wt 142.3 lb

## 2014-09-04 DIAGNOSIS — D649 Anemia, unspecified: Secondary | ICD-10-CM

## 2014-09-04 DIAGNOSIS — I5022 Chronic systolic (congestive) heart failure: Secondary | ICD-10-CM

## 2014-09-04 DIAGNOSIS — R269 Unspecified abnormalities of gait and mobility: Secondary | ICD-10-CM

## 2014-09-04 DIAGNOSIS — L03011 Cellulitis of right finger: Secondary | ICD-10-CM

## 2014-09-04 DIAGNOSIS — G629 Polyneuropathy, unspecified: Secondary | ICD-10-CM | POA: Diagnosis not present

## 2014-09-04 DIAGNOSIS — E1142 Type 2 diabetes mellitus with diabetic polyneuropathy: Secondary | ICD-10-CM | POA: Diagnosis not present

## 2014-09-04 DIAGNOSIS — N183 Chronic kidney disease, stage 3 unspecified: Secondary | ICD-10-CM

## 2014-09-04 DIAGNOSIS — I5032 Chronic diastolic (congestive) heart failure: Secondary | ICD-10-CM

## 2014-09-04 DIAGNOSIS — D500 Iron deficiency anemia secondary to blood loss (chronic): Secondary | ICD-10-CM | POA: Diagnosis not present

## 2014-09-04 DIAGNOSIS — E1122 Type 2 diabetes mellitus with diabetic chronic kidney disease: Secondary | ICD-10-CM | POA: Diagnosis not present

## 2014-09-04 DIAGNOSIS — I130 Hypertensive heart and chronic kidney disease with heart failure and stage 1 through stage 4 chronic kidney disease, or unspecified chronic kidney disease: Secondary | ICD-10-CM | POA: Diagnosis not present

## 2014-09-04 DIAGNOSIS — I272 Other secondary pulmonary hypertension: Secondary | ICD-10-CM

## 2014-09-04 DIAGNOSIS — B9689 Other specified bacterial agents as the cause of diseases classified elsewhere: Secondary | ICD-10-CM

## 2014-09-04 DIAGNOSIS — I10 Essential (primary) hypertension: Secondary | ICD-10-CM | POA: Diagnosis not present

## 2014-09-04 DIAGNOSIS — I50812 Chronic right heart failure: Secondary | ICD-10-CM

## 2014-09-04 DIAGNOSIS — IMO0001 Reserved for inherently not codable concepts without codable children: Secondary | ICD-10-CM

## 2014-09-04 LAB — CBC
HEMATOCRIT: 27.7 % — AB (ref 36.0–46.0)
HEMOGLOBIN: 8.8 g/dL — AB (ref 12.0–15.0)
MCH: 27.2 pg (ref 26.0–34.0)
MCHC: 31.8 g/dL (ref 30.0–36.0)
MCV: 85.5 fL (ref 78.0–100.0)
MPV: 12.1 fL (ref 8.6–12.4)
Platelets: 208 10*3/uL (ref 150–400)
RBC: 3.24 MIL/uL — ABNORMAL LOW (ref 3.87–5.11)
RDW: 15 % (ref 11.5–15.5)
WBC: 5.2 10*3/uL (ref 4.0–10.5)

## 2014-09-04 LAB — BASIC METABOLIC PANEL WITH GFR
BUN: 57 mg/dL — AB (ref 6–23)
CO2: 23 meq/L (ref 19–32)
Calcium: 9.3 mg/dL (ref 8.4–10.5)
Chloride: 102 mEq/L (ref 96–112)
Creat: 1.82 mg/dL — ABNORMAL HIGH (ref 0.50–1.10)
GFR, EST AFRICAN AMERICAN: 34 mL/min — AB
GFR, Est Non African American: 29 mL/min — ABNORMAL LOW
Glucose, Bld: 85 mg/dL (ref 70–99)
POTASSIUM: 4.4 meq/L (ref 3.5–5.3)
SODIUM: 139 meq/L (ref 135–145)

## 2014-09-04 LAB — GLUCOSE, CAPILLARY: Glucose-Capillary: 114 mg/dL — ABNORMAL HIGH (ref 65–99)

## 2014-09-04 LAB — POCT GLYCOSYLATED HEMOGLOBIN (HGB A1C): Hemoglobin A1C: 5

## 2014-09-04 MED ORDER — SULFAMETHOXAZOLE-TRIMETHOPRIM 800-160 MG PO TABS: 1.0000 | ORAL_TABLET | Freq: Two times a day (BID) | ORAL | Status: DC

## 2014-09-04 NOTE — Assessment & Plan Note (Signed)
No changes in urinary habits today.  Has fluctuated a bit in the last year.  She does have chronically elevated BUN as well.  Check BMET today for function.

## 2014-09-04 NOTE — Patient Instructions (Signed)
General Instructions: Please schedule a follow up visit within the next 2-3 months, sooner if needed.   For your medications:   Please bring all of your pill  Bottles with you to each visit.  This will help make sure that we have an up to date list of all the medications you are taking.  Please also bring any over the counter herbal medications you are taking (not including advil, tylenol, etc.)  Please continue taking all of your medications for pulmonary hypertension and scleroderma as prescribed  For your finger infection (paronychia), please take Bactrim 1 DS tablet twice a day, you will also need to go and see the hand surgeon.   More information below.   Thank you!    Treatment Goals:  Goals (1 Years of Data) as of 09/04/14      Lifestyle   . Prevent Falls       Progress Toward Treatment Goals:  Treatment Goal 09/04/2014  Hemoglobin A1C at goal  Blood pressure at goal  Prevent falls unchanged    Self Care Goals & Plans:  Self Care Goal 02/27/2014  Manage my medications bring my medications to every visit; refill my medications on time; take my medicines as prescribed  Monitor my health keep track of my blood glucose; bring my glucose meter and log to each visit  Eat healthy foods eat more vegetables; eat foods that are low in salt; eat baked foods instead of fried foods  Be physically active -  Prevent falls wear appropriate shoes    Home Blood Glucose Monitoring 08/22/2013  Check my blood sugar once a day  When to check my blood sugar -     Care Management & Community Referrals:  Referral 04/16/2013  Referrals made for care management support none needed  Referrals made to community resources -     Paronychia  Paronychia is an infection of the skin caused by germs. It happens by the fingernail or toenail. You can avoid it by not:  Pulling on hangnails.  Nail biting.  Thumb sucking.  Cutting fingernails and toenails too short.  Cutting the skin at  the base and sides of the fingernail or toenail (cuticle). HOME CARE  Keep the fingers or toes very dry. Put rubber gloves over cotton gloves when putting hands in water.  Keep the wound clean and bandaged (dressed) as told by your doctor.  Soak the fingers or toes in warm water for 15 to 20 minutes. Soak them 3 to 4 times per day for germ infections. Fungal infections are difficult to treat. Fungal infections often require treatment for a long time.  Only take medicine as told by your doctor. GET HELP RIGHT AWAY IF:   You have redness, puffiness (swelling), or pain that gets worse.  You see yellowish-white fluid (pus) coming from the wound.  You have a fever.  You have a bad smell coming from the wound or bandage. MAKE SURE YOU:  Understand these instructions.  Will watch your condition.  Will get help if you are not doing well or get worse. Document Released: 02/17/2009 Document Revised: 05/24/2011 Document Reviewed: 02/17/2009 [facility] Patient Information 2015 [place], [place]. This information is not intended to replace advice given to you by your health care provider. Make sure you discuss any questions you have with your health care provider.  Sulfamethoxazole; Trimethoprim, SMX-TMP tablets What is this medicine? SULFAMETHOXAZOLE; TRIMETHOPRIM or SMX-TMP (suhl fuh meth OK suh zohl; trye METH oh prim) is a combination of a  sulfonamide antibiotic and a second antibiotic, trimethoprim. It is used to treat or prevent certain kinds of bacterial infections. It will not work for colds, flu, or other viral infections. This medicine may be used for other purposes; ask your health care provider or pharmacist if you have questions. COMMON BRAND NAME(S): Bacter-Aid DS, Bactrim, Bactrim DS, Septra, Septra DS What should I tell my health care provider before I take this medicine? They need to know if you have any of these conditions: -anemia -asthma -being treated with  anticonvulsants -if you frequently drink alcohol containing drinks -kidney disease -liver disease -low level of folic acid or [id number]-4-[id number] dehydrogenase -poor nutrition or malabsorption -porphyria -severe allergies -thyroid disorder -an unusual or allergic reaction to sulfamethoxazole, trimethoprim, sulfa drugs, other medicines, foods, dyes, or preservatives -pregnant or trying to get pregnant -breast-feeding How should I use this medicine? Take this medicine by mouth with a full glass of water. Follow the directions on the prescription label. Take your medicine at regular intervals. Do not take it more often than directed. Do not skip doses or stop your medicine early. Talk to your pediatrician regarding the use of this medicine in children. Special care may be needed. This medicine has been used in children as young as 54 months of age. Overdosage: If you think you have taken too much of this medicine contact a poison control center or emergency room at once. NOTE: This medicine is only for you. Do not share this medicine with others. What if I miss a dose? If you miss a dose, take it as soon as you can. If it is almost time for your next dose, take only that dose. Do not take double or extra doses. What may interact with this medicine? Do not take this medicine with any of the following medications: -aminobenzoate potassium -dofetilide -metronidazole This medicine may also interact with the following medications: -ACE inhibitors like benazepril, enalapril, lisinopril, and ramipril -birth control pills -cyclosporine -digoxin -diuretics -indomethacin -medicines for diabetes -methenamine -methotrexate -phenytoin -potassium supplements -pyrimethamine -sulfinpyrazone -tricyclic antidepressants -warfarin This list may not describe all possible interactions. Give your health care provider a list of all the medicines, herbs, non-prescription drugs, or dietary supplements you  use. Also tell them if you smoke, drink alcohol, or use illegal drugs. Some items may interact with your medicine. What should I watch for while using this medicine? Tell your doctor or health care professional if your symptoms do not improve. Drink several glasses of water a day to reduce the risk of kidney problems. Do not treat diarrhea with over the counter products. Contact your doctor if you have diarrhea that lasts more than 2 days or if it is severe and watery. This medicine can make you more sensitive to the sun. Keep out of the sun. If you cannot avoid being in the sun, wear protective clothing and use a sunscreen. Do not use sun lamps or tanning beds/booths. What side effects may I notice from receiving this medicine? Side effects that you should report to your doctor or health care professional as soon as possible: -allergic reactions like skin rash or hives, swelling of the face, lips, or tongue -breathing problems -fever or chills, sore throat -irregular heartbeat, chest pain -joint or muscle pain -pain or difficulty passing urine -red pinpoint spots on skin -redness, blistering, peeling or loosening of the skin, including inside the mouth -unusual bleeding or bruising -unusually weak or tired -yellowing of the eyes or skin Side effects that usually do not  require medical attention (report to your doctor or health care professional if they continue or are bothersome): -diarrhea -dizziness -headache -loss of appetite -nausea, vomiting -nervousness This list may not describe all possible side effects. Call your doctor for medical advice about side effects. You may report side effects to FDA at 1-800-FDA-1088. Where should I keep my medicine? Keep out of the reach of children. Store at room temperature between 20 to 25 degrees C (68 to 77 degrees F). Protect from light. Throw away any unused medicine after the expiration date. NOTE: This sheet is a summary. It may not cover  all possible information. If you have questions about this medicine, talk to your doctor, pharmacist, or health care provider.  2015, [employer]vier/Gold Standard. (2012-10-06 14:38:26)

## 2014-09-04 NOTE — Assessment & Plan Note (Signed)
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  She may have had a self-limited AVM bleed earlier in May, but she reports this is resolved now and she has no more blood per rectum or diarrhea.  I am going to look in to getting her in to see her GI doctor (has seen Deatra Ina in the past) b/c she also has chronic nausea and gastroparesis.  CBC today for stability.  Continue iron supplementation.

## 2014-09-04 NOTE — Progress Notes (Signed)
Subjective:    Patient ID: [name], female    DOB: 27-Jun-1950, 64 y.o.   MRN: [id number]  CC: Routine follow up for DM, HTN  HPI  Ms. 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   Ms. Fader has had a complicated course since I last saw her in December of 2015 -  Admitted in December of 2015 for dyspnea and chest pain, had a large pericardial effusion without tamponade.  Also had a GI bleed due to AVM at that time.   TTE in January of 2016 showed severely dilated RV, large pericardial effusion.  She was admitted in February of 2016 for worsening diastolic CHF, TTE at that time now showed borderline tamponade.  She had a pericardiocentesis, with no further pleural effusion.  She developed pAfib during that hospitalization and was started on amiodarone.  No anticoagulation due to GI bleed and effusion.  She returned for follow up in CHF clinic in March of 2016 where she was noted to be taking her PAH medications.  She was seen again in April in the clinic.  If she remains compliant with medications, she may be a candidate for IV therapy.   Seen in June in Neurology clinic for worsening gait, she had nortriptyline added to lyrica for diabetic neuropathy.  Given her other symptoms including diarrhea and neck pain, an MRI of the cervical spine was done and a NCS.  The NCS showed mild abnormality with polyneuropathy.  The MRI was done yesterday and no report is available as of yet.  She denies any further bowel incontinence which she reported to her Neurologist.  The Nortriptyline is helping somewhat with her neuropathy.   She was seen in our clinic on 08/22/14 for paronychia.  She was given an Rx for doxycycline.  She notes that her finger is not better, and she thinks that it is actually worse.  It is no longer draining, however, it has a  whitish area on the lateral edge of the finger and some skin breakdown under the nail.  It is not warm, but there is some swelling of the PIP joint and distal finger pad.  She took the doxycycline without issue and has noticed no change.  No fever, no chills, no erythema or increased warmth.      Her breathing is okay right now, she notes occasional nausea which she has dealt with for a long time.  She has some dizziness when she stands from a seated position, but this resolves with walking.  She does not have weakness or syncope.  No headache.  No falls.  She does feel tired and fatigued most of the days.   Last month, bleeding from bowels, like on a period, bright red blood in the toilet bowel.  This has now resolved, she did not seek medical care for this issue.  No current blood loss, no melena.  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  She has not been to see GI since being discharged from the hospital.  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  She has had chronic anemia for a long while due to chronic blood loss thought to be due to AVMs.  Last CBC was in February of this year and was stable  and low at 8.8/27.2 with an MCV of 80.  She is on chronic iron.  Her baseline has been around 9-10 in 2014 and 2015, but closer to 8-9 in the last year.  Some of her fatigue may be explained by this along with her severe heart failure.    She did not bring in her medications.    Review of Systems  Constitutional: Negative for fever, chills and unexpected weight change.  HENT: Negative for ear discharge and ear pain.   Eyes: Negative for photophobia and visual disturbance.  Respiratory: Positive for shortness of breath. Negative for cough.   Cardiovascular: Positive for chest pain (mild, not tight like before). Negative for palpitations and leg swelling.  Gastrointestinal: Positive for diarrhea (better today), blood in stool and anal bleeding.    Genitourinary: Negative for dysuria and difficulty urinating.  Musculoskeletal: Positive for back pain (mild) and gait problem (due to polyneuropathy). Negative for arthralgias.  Skin: Negative for rash and wound.  Neurological: Positive for dizziness and numbness. Negative for syncope and weakness.       Objective:   Physical Exam  Constitutional: No distress.  Appears older than stated age, and fatigued  HENT:  Head: Normocephalic and atraumatic.  Eyes: Conjunctivae are normal. No scleral icterus.  No conjunctival pallor noted  Cardiovascular: Normal rate, regular rhythm and normal heart sounds.   No murmur heard. Pulmonary/Chest: Effort normal and breath sounds normal. No respiratory distress. She has no wheezes. She exhibits no tenderness.  Abdominal: Soft. Bowel sounds are normal. She exhibits distension (soft, has a history of ascites). There is no tenderness.  Musculoskeletal: She exhibits no edema or tenderness.  Lymphadenopathy:    She has no cervical adenopathy.  Neurological: She is alert.  Skin: Skin is warm and dry. No rash noted. No erythema.      BMET and CBC today     Assessment & Plan:  RTC in 2 months

## 2014-09-04 NOTE — Assessment & Plan Note (Signed)
Last elevated A1C was in 2013, today it is 5.0.  She does not take any medications for her BS, however, I am concerned she may be having occasional low blood sugars.  She is sometimes unable to eat regularly due to her nausea.  Will discuss glucerna supplementation and possibly glucose tabs/candy for acute low blood sugar.  She is aware and takes something to eat when she notices the symptoms now.

## 2014-09-04 NOTE — Assessment & Plan Note (Signed)
Seen in Neurology. Thought to be related to polyneuropathy.  No falls and no issues today.  She is taking lyrica and nortriptyline for polyneuropathy and is improving.

## 2014-09-04 NOTE — Assessment & Plan Note (Signed)
Follows with Cardiology and has previously followed with Pulmonology.  She is due to see the CHF clinic tomorrow.

## 2014-09-04 NOTE — Assessment & Plan Note (Signed)
Unimproved after doxycycline.  Give course of Bactrim 1DS tab BID for 7 days and consult to hand surgery placed today for possible I&D.

## 2014-09-04 NOTE — Assessment & Plan Note (Signed)
She is now having hypotension, likely related to her severe heart failure.  No anti-hypertensives at this time.  She is on lasix.

## 2014-09-04 NOTE — Assessment & Plan Note (Signed)
Due to see Cardiology tomorrow.

## 2014-09-05 ENCOUNTER — Ambulatory Visit (HOSPITAL_COMMUNITY)
Admission: RE | Admit: 2014-09-05 | Discharge: 2014-09-05 | Disposition: A | Discharge status: Home / Self Care | Payer: Medicare Other | Source: Ambulatory Visit | Attending: Cardiology | Admitting: Cardiology

## 2014-09-05 ENCOUNTER — Telehealth: Payer: Self-pay | Admitting: Neurology

## 2014-09-05 ENCOUNTER — Encounter (HOSPITAL_COMMUNITY): Payer: Self-pay

## 2014-09-05 ENCOUNTER — Telehealth (HOSPITAL_COMMUNITY): Payer: Self-pay | Admitting: *Deleted

## 2014-09-05 VITALS — BP 96/52 | HR 61 | Wt 144.0 lb

## 2014-09-05 DIAGNOSIS — I129 Hypertensive chronic kidney disease with stage 1 through stage 4 chronic kidney disease, or unspecified chronic kidney disease: Secondary | ICD-10-CM | POA: Diagnosis not present

## 2014-09-05 DIAGNOSIS — E119 Type 2 diabetes mellitus without complications: Secondary | ICD-10-CM | POA: Diagnosis not present

## 2014-09-05 DIAGNOSIS — Z79899 Other long term (current) drug therapy: Secondary | ICD-10-CM | POA: Insufficient documentation

## 2014-09-05 DIAGNOSIS — I313 Pericardial effusion (noninflammatory): Secondary | ICD-10-CM | POA: Diagnosis not present

## 2014-09-05 DIAGNOSIS — N183 Chronic kidney disease, stage 3 (moderate): Secondary | ICD-10-CM | POA: Diagnosis not present

## 2014-09-05 DIAGNOSIS — D573 Sickle-cell trait: Secondary | ICD-10-CM | POA: Insufficient documentation

## 2014-09-05 DIAGNOSIS — I272 Other secondary pulmonary hypertension: Secondary | ICD-10-CM | POA: Diagnosis not present

## 2014-09-05 DIAGNOSIS — I509 Heart failure, unspecified: Secondary | ICD-10-CM | POA: Diagnosis not present

## 2014-09-05 DIAGNOSIS — I5032 Chronic diastolic (congestive) heart failure: Secondary | ICD-10-CM

## 2014-09-05 DIAGNOSIS — I4891 Unspecified atrial fibrillation: Secondary | ICD-10-CM | POA: Insufficient documentation

## 2014-09-05 DIAGNOSIS — K219 Gastro-esophageal reflux disease without esophagitis: Secondary | ICD-10-CM | POA: Insufficient documentation

## 2014-09-05 DIAGNOSIS — I2781 Cor pulmonale (chronic): Secondary | ICD-10-CM | POA: Diagnosis not present

## 2014-09-05 DIAGNOSIS — I50810 Right heart failure, unspecified: Secondary | ICD-10-CM

## 2014-09-05 DIAGNOSIS — I2721 Secondary pulmonary arterial hypertension: Secondary | ICD-10-CM

## 2014-09-05 NOTE — Progress Notes (Signed)
Patient ID: [name], female   DOB: [birth year], 64 y.o.   MRN: [id number] Pulmonary: Dr Lamonte Sakai   HPI: [name] is a 64 yo female with a history of DM2, HLD, HTN, pHTN, GERD, gastroparesis, scleroderma, DJD and diastolic HF.   Of note she has been on several PAH targeted therapies and coumadin over last 9 years, but has not been reliably on therapy due to noncompliance and inability to have labs performed. She has had best response to Tyvaso (+ bosentan) with an improvement in 6 minute walk and in PASP from 102 mmHg (7/09) to 42 mmHg (9/10). After stopping Tyvaso her PASP rose to 80's by TTE 6/13 and 11/14. Surprisingly, she has never required supplemental O2. Macitentan was started in 11/14. She developed nausea and diarrhea and macitentan was stopped early 3/15. Most recently, she had been on Letairis and Adcirca (followed by Dr. Lamonte Sakai).   Admitted 10/14-10/18/15 for A/C RHF, volume overload and syncope. Diuresed with IV lasix and milrinone a total of 26 lbs. Discharge weight was 156 lbs and started on lasix 40 mg BID.   Admitted 12/29 with dyspnea and chest pain. BP was low, and Lasix and Adcirca were both initially held. Echo showed EF 60-65% with moderately dilated and dysfunctional RV, PASP 111 mmHg with moderate TR. There was a large pericardial effusion without definite tamponade. Presentation was suspected to be due to progressive PAH with secondary RV failure rather than tamponade. No pericardiocentesis yet. She was started on milrinone gtt and diuresed. Shorter-acting sildenafil was started instead of Adcirca. Also had GI bleed. 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 Discharge weight was 146 pounds.   Echo (1/16) with EF 55-60%, severely dilated RV with moderately decreased systolic function, large pericardial effusion.   She was admitted in 2/16 with acute on chronic diastolic CHF/RV failure and hypotension.  Echo (2/16) showed EF 60%,  moderate-severe RV dilation with moderate-severe RV systolic dysfunction, PA systolic pressure 62 mmHg, large pericardial effusion with borderline tamponade.  She had pericardiocentesis with improvement in symptoms.  No malignant cells in pericardial fluid.  Repeat echo showed no pericardial effusion.  She developed paroxysmal atrial fibrillation on 2/19 but went back into NSR on amiodarone.  She was not anticoagulated given history of GI bleeding and pericardial effusion.   She is stable today.  She says that she is overall feeling better compared to the past.  No dyspnea walking around her house.  Dyspnea walking about 1/2 block.  No lightheadedness or syncope.  Rare atypical chest pain => this is improved compared to the past.  She is taking Lasix 80 mg bid.  Planned to switch from Tyvaso to Selexipag but has not started conversion yet.   6 min walk (05/16/14): 125 m 6 min walk (6/16): 146 m  Echo (1/16) with EF 55-60%, severely dilated RA with moderately decreased systolic function, large pericardial effusion.  ECHO 05/09/2014: EF 60-65% Peak PA pressure 52  ECHO 06/05/14: EF 55-60%, RV moderately dilated with moderately decreased systolic function with D-shaped interventricular septum. Severe RAE. RVSP 59. No pericardial effusion.  RHC (1/16):  Hemodynamics (mmHg) RA mean 12 RV 64/14 PA 68/29, mean 42 PCWP mean 12 LV 102/14 AO 105/53 PA 67% AO 93% Cardiac Output (Fick) 6.63  Cardiac Index (Fick) 3.88  PVR 4.5 WU  Labs 03/24/2014 K 3.8 Creatinine 1.51 Hgb 9.3  Labs 2/16 BNP 121, K 4.4, creatinine 1.27, hgb 9.2 Labs 2/16 K 3.8, creatinine  1.58, AST 45, ALT 12 Labs 05/22/2014: Creatinine 2.1 K 3.7  Labs 06/05/2014: Creatinine 1.5 K 3.7, LFTs normal [facility] 6/16: K 4.4, creatinine 1.82, hgb 8.8  ROS: All systems negative except as listed in HPI, PMH and Problem List.  SH:  History   Social History  . Marital Status: Legally Separated    Spouse Name: N/A  . Number of Children: 2  .  Years of Education: 11   Occupational History  . Umemployed   . Disability     Social History Main Topics  . Smoking status: Never Smoker   . Smokeless tobacco: Never Used  . Alcohol Use: No  . Drug Use: No  . Sexual Activity: Not on file   Other Topics Concern  . Not on file   Social History Narrative    FAMILY HISTORY:  Significant for coronary artery disease and diabetes   Patient lives at home with granddaughter.    Patient has 2 children.    Patient has 11 years of education.    Patient is on disability.    Patient is right handed.    Patient is separated.      FH:  Family History  Problem Relation Age of Onset  . Heart disease Mother   . Diabetes Mother   . Diabetes Sister     Past Medical History  Diagnosis Date  . Secondary pulmonary hypertension     right heart cath 04/20/04  . Cough   . Allergic rhinitis, cause unspecified   . Esophageal reflux   . Systemic sclerosis   . Unspecified essential hypertension   . Gastritis   . Sickle cell trait     "trace"  . Obesity   . Visual changes   . Scleroderma   . Diastolic dysfunction   . Trichomonas   . Vaginal bleeding   . Neuropathy   . CHF (congestive heart failure)   . Type II diabetes mellitus     DIET CONTROL   . Bursitis   . History of blood transfusion     "think it was related to when I had partial hysteretomy; might have been for one of my knee ORs"  . Degeneration of lumbar or lumbosacral intervertebral disc   . Arthritis     "all over my body" (08/04/2013)  . Chronic kidney disease     "been in hospital for it; don't know what kind" (08/04/2013)    Current Outpatient Prescriptions  Medication Sig Dispense Refill  . ADCIRCA 20 MG TABS TAKE 2 TABLETS (40MG) BY MOUTH ONCE DAILY WITH OR WITHOUT FOOD. CALL [phone] FOR REFILLS. 60 tablet 11  . ambrisentan (LETAIRIS) 10 MG tablet Take 1 tablet (10 mg total) by mouth daily. 30 tablet 11  . amiodarone (PACERONE) 200 MG tablet Take 1 tablet  (200 mg total) by mouth daily. 30 tablet 6  . doxycycline (VIBRA-TABS) 100 MG tablet Take 1 tablet (100 mg total) by mouth 2 (two) times daily. 20 tablet 0  . esomeprazole (NEXIUM) 40 MG capsule Take 40 mg by mouth 2 (two) times daily.     . fenofibrate 54 MG tablet Take 1 tablet (54 mg total) by mouth daily. 30 tablet 12  . ferrous sulfate ([employer] IRON) 325 (65 FE) MG tablet Take 325 mg by mouth daily with breakfast.    . FLUoxetine (PROZAC) 20 MG capsule take 1 capsule by mouth every morning 30 capsule 2  . furosemide (LASIX) 80 MG tablet Take 1 tablet (80 mg total)  by mouth 2 (two) times daily. 90 tablet 3  . HYDROcodone-acetaminophen (NORCO) 10-325 MG per tablet Take 1 tablet by mouth every 6 (six) hours as needed for moderate pain.     . nortriptyline (PAMELOR) 10 MG capsule 2 tabs po qhs 60 capsule 11  . pregabalin (LYRICA) 150 MG capsule Take 1 capsule (150 mg total) by mouth 3 (three) times daily. 90 capsule 5  . Selexipag 200 MCG TABS Take 1 tablet by mouth 2 (two) times daily. 60 tablet 0  . sulfamethoxazole-trimethoprim (BACTRIM DS,SEPTRA DS) 800-160 MG per tablet Take 1 tablet by mouth 2 (two) times daily. 14 tablet 0  . Treprostinil (TYVASO) 0.6 MG/ML SOLN Inhale 18 mcg into the lungs 4 (four) times daily.     . promethazine (PHENERGAN) 12.5 MG tablet Take 1 tablet (12.5 mg total) by mouth every 6 (six) hours as needed for nausea or vomiting. 45 tablet 1   No current facility-administered medications for this encounter.    Filed Vitals:   09/05/14 0929  BP: 96/52  Pulse: 61  Weight: 144 lb (65.318 kg)  SpO2: 99%    PHYSICAL EXAM:  General: NAD Neck: JVP 7 cm, no thyromegaly or thyroid nodule.  Lungs: Decreased in the bases otherwise clean.   CV: RV heave. Heart regular S1/S2 with widely split S2,  2/6 HSM LLSB. No edema.  Abdomen: Soft, nontender, no hepatosplenomegaly. Minimally distended.   Neurologic: Alert and oriented x 3.  Psych: Normal affect. Extremities:  No clubbing or cyanosis.   ASSESSMENT & PLAN: 1. 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  Echo 05/2014 showed EF 55-60% with moderately dilated and moderately dysfunctional RV. Pericardial effusion had resolved. Volume status looks ok on exam today, NYHA class III symptoms but seems to be improved compared to the past.   - Continue Lasix 80 mg bid. 2. PAH with cor pulmonale: Severe PAH.  Had recent [employer] in 1/16.  6 minute walk today is mildly improved compared to 3/16.  - Continue ambrisentan 10 mg daily - Wants to switch from Tyvaso to selexipag for ease of use: will start selexipag 200 bid and decrease Tyvaso to 6 puffs 4 times/day; after 1 week will increase Selexipag to 400 bid and decrease Tyvaso to 3 puffs 4 times/day; after another week will increase Selexipag to 800 bid and stop Tyvaso.   - Continue Adcirca 40 mg daily.  3. CKD III: BMET stable in 6/16.  4. 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  She is no longer anticoagulated.  5. Pericardial effusion: Resolved after pericardiocentesis. Repeat echo in 3-6 months, 6. Atrial fibrillation: Regular rhythm. Paroxysmal.  Continue amiodarone. She is not anticoagulated due to h/o GI bleeding.  LFTs and TSH ok 05/22/2014. She  will need a yearly eye exam while on amiodarone.  Follow up in 1 month to see how she is doing on Selexipag.   [name] MD  09/05/2014

## 2014-09-05 NOTE — Telephone Encounter (Signed)
Spoke to patient - she is aware of results - she will keep her follow up appt to further discuss.

## 2014-09-05 NOTE — Progress Notes (Signed)
6 Minute Walk Patient ambulated total of 480 feet (146.304 meters) total. Sats on Room air 94-98% Heart rate maintained in the 60's. Slow steady gait and pace, slightly short of breath but otherwise tolerated well. Dr. [name] in clinic made aware of results.  Renee Pain

## 2014-09-05 NOTE — Patient Instructions (Signed)
Titration Schedule for changing Tyvaso to Selexipag [name]):  Friday 6/24: Start Selexipag 200 mcg Twice daily                      Decrease Tyvaso to 6 puffs Four times daily  Friday 7/1: Increase Selexipag to 400 mcg Twice daily                    Decrease Tyvaso to 3 puffs Four times daily  Friday 7/8: Increase Selexipag to 800 mcg Twice daily                    STOP Tyvaso  Your physician recommends that you schedule a follow-up appointment in: 1 month

## 2014-09-05 NOTE — Telephone Encounter (Signed)
Called Joaquim Lai and gave her titration instructions to change pt's Tyvaso to Uptravi per Dr Aundra Dubin, pt was also provided with instructions on her AVS at appt today.  Friday 6/24: Start Selexipag 200 mcg Twice daily   Decrease Tyvaso to 6 puffs Four times daily  Friday 7/1: Increase Selexipag to 400 mcg Twice daily   Decrease Tyvaso to 3 puffs Four times daily  Friday 7/8: Increase Selexipag to 800 mcg Twice daily   STOP Tyvaso

## 2014-09-05 NOTE — Telephone Encounter (Signed)
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 There is indentation of the thecal sac at both levels with possible spinal cord compression though there is no myelopathic signal.

## 2014-09-10 ENCOUNTER — Encounter: Payer: Self-pay | Admitting: Neurology

## 2014-09-10 ENCOUNTER — Ambulatory Visit (INDEPENDENT_AMBULATORY_CARE_PROVIDER_SITE_OTHER): Payer: Medicare Other | Admitting: Neurology

## 2014-09-10 VITALS — BP 106/60 | HR 61 | Ht 64.0 in | Wt 144.0 lb

## 2014-09-10 DIAGNOSIS — R32 Unspecified urinary incontinence: Secondary | ICD-10-CM

## 2014-09-10 DIAGNOSIS — G629 Polyneuropathy, unspecified: Secondary | ICD-10-CM

## 2014-09-10 DIAGNOSIS — R269 Unspecified abnormalities of gait and mobility: Secondary | ICD-10-CM | POA: Diagnosis not present

## 2014-09-10 DIAGNOSIS — R209 Unspecified disturbances of skin sensation: Secondary | ICD-10-CM

## 2014-09-10 DIAGNOSIS — E1142 Type 2 diabetes mellitus with diabetic polyneuropathy: Secondary | ICD-10-CM

## 2014-09-10 DIAGNOSIS — R413 Other amnesia: Secondary | ICD-10-CM

## 2014-09-10 NOTE — Progress Notes (Signed)
Chief Complaint  Patient presents with  . Abnormal Gait    She is here with her sister, Judson Roch.  Feels she is resting better at night with the addition of nortriptyline.  She would like to review her MRI and EMG/NCV results.   Chief Complaint  Patient presents with  . Abnormal Gait    She is here with her sister, Judson Roch.  Feels she is resting better at night with the addition of nortriptyline.  She would like to review her MRI and EMG/NCV results.     [facility]  PATIENT: [name] DOB: 03-Jul-1950   REASON FOR VISIT: Followup for peripheral neuropathy   HISTORY OF PRESENT ILLNESS: Ms. Gootee, 64 year old female returns for followup diabetic peripheral neuropathy, last clinical visit was with Hoyle Sauer in May 2015   She presented in 2005 with bilateral feet paresthesias which preceded her diagnosis of type 2 diabetes.  EMG nerve conduction study in 2006 was normal, there was no evidence of large fiber peripheral neuropathy.   Her symptoms have progressed over the years, she complains of bilateral feet paresthesia, she is currently on Lyrica 150 mg three times  daily and gabapentin 100 twice a day.   She has a history of scleroderma and  is on low-dose prednisone. She had a recent hospital admission for hypotension due to nausea and vomiting. Her Lasix was reduced.   UPDATE June 6th 2016: She is now complains of worsening bilateral feet paresthesia, also involving bilateral fingertips, difficulty making a tight grip, worsening gait difficulty, neck pain, almost daily bowel accident for 2 years,  She is taking Lyrica 150 mg 3 times a day, along with low-dose gabapentin, still complains of bilateral feet paresthesia, difficulty sleeping at nighttime.  UPDATE September 10 2014: EMG nerve conduction study in August 29 2014 showed mild axonal peripheral neuropathy.  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 There is indentation of the thecal sac at both levels with possible spinal cord compression though there is no myelopathic signal.  She complains of whole body achy, her neck pain is 5/10, constant, radiating to right shoulder and arm sometimes.  She continues to complains of bowel incontinence, few times each week,   I have reviewed her most recent cardiology record by Dr. Benjamine Mola in September 05 2014, right side heart failure: Due to severe PAH in the setting of scleroderma. Echo 05/2014 showed EF 55-60% with moderately dilated and moderately dysfunctional RV. Pericardial effusion had resolved. Paroxysmal atrial fibrillation not on anticoagulation due to previous GI bleeding, on amiodarone    REVIEW OF SYSTEMS: Full 14 system review of systems performed and notable only for those listed, all others are neg: Shortness of breath, leg swelling      ALLERGIES: Allergies  Allergen Reactions  . Cephalexin Rash  . Ciprofloxacin Rash  . Codeine Other (See Comments)    REACTION: GI upset  . Contrast Media [Iodinated Diagnostic Agents] Hives  . Iohexol Hives     Code: HIVES, Desc: pt breaks out in large hives. needs full premeds, Onset Date: [id number]     HOME MEDICATIONS: Outpatient Prescriptions Prior to Visit  Medication Sig Dispense Refill  . ADCIRCA 20 MG TABS TAKE 2 TABLETS (40MG) BY MOUTH ONCE DAILY WITH OR WITHOUT FOOD. CALL [phone] FOR REFILLS. 60 tablet 11  . ambrisentan (  LETAIRIS) 10 MG tablet Take 1 tablet (10 mg total) by mouth daily. 30 tablet 11  . amiodarone (PACERONE) 200 MG tablet Take 1 tablet (200 mg total) by mouth daily. 30 tablet 6  . doxycycline (VIBRA-TABS) 100 MG tablet Take 1 tablet (100 mg total) by mouth 2 (two) times daily. 20 tablet 0  . esomeprazole (NEXIUM) 40 MG capsule Take 40 mg by mouth 2 (two) times daily.     . fenofibrate  54 MG tablet Take 1 tablet (54 mg total) by mouth daily. 30 tablet 12  . ferrous sulfate ([employer] IRON) 325 (65 FE) MG tablet Take 325 mg by mouth daily with breakfast.    . FLUoxetine (PROZAC) 20 MG capsule take 1 capsule by mouth every morning 30 capsule 2  . furosemide (LASIX) 80 MG tablet Take 1 tablet (80 mg total) by mouth 2 (two) times daily. 90 tablet 3  . HYDROcodone-acetaminophen (NORCO) 10-325 MG per tablet Take 1 tablet by mouth every 6 (six) hours as needed for moderate pain.     . nortriptyline (PAMELOR) 10 MG capsule 2 tabs po qhs 60 capsule 11  . pregabalin (LYRICA) 150 MG capsule Take 1 capsule (150 mg total) by mouth 3 (three) times daily. 90 capsule 5  . promethazine (PHENERGAN) 12.5 MG tablet Take 1 tablet (12.5 mg total) by mouth every 6 (six) hours as needed for nausea or vomiting. 45 tablet 1  . Selexipag 200 MCG TABS Take 1 tablet by mouth 2 (two) times daily. 60 tablet 0  . sulfamethoxazole-trimethoprim (BACTRIM DS,SEPTRA DS) 800-160 MG per tablet Take 1 tablet by mouth 2 (two) times daily. 14 tablet 0  . Treprostinil (TYVASO) 0.6 MG/ML SOLN Inhale 18 mcg into the lungs 4 (four) times daily.      No facility-administered medications prior to visit.    PAST MEDICAL HISTORY: Past Medical History  Diagnosis Date  . Secondary pulmonary hypertension     right heart cath 04/20/04  . Cough   . Allergic rhinitis, cause unspecified   . Esophageal reflux   . Systemic sclerosis   . Unspecified essential hypertension   . Gastritis   . Sickle cell trait     "trace"  . Obesity   . Visual changes   . Scleroderma   . Diastolic dysfunction   . Trichomonas   . Vaginal bleeding   . Neuropathy   . CHF (congestive heart failure)   . Type II diabetes mellitus     DIET CONTROL   . Bursitis   . History of blood transfusion     "think it was related to when I had partial hysteretomy; might have been for one of my knee ORs"  . Degeneration of lumbar or lumbosacral intervertebral  disc   . Arthritis     "all over my body" (08/04/2013)  . Chronic kidney disease     "been in hospital for it; don't know what kind" (08/04/2013)    PAST SURGICAL HISTORY: Past Surgical History  Procedure Laterality Date  . Tubal ligation    . Esophagogastroduodenoscopy  02/23/2010    multiple  . Shoulder arthroscopy Right 12/2009    subacromial decompression  . Replacement total knee Left 11/2000  . Cardiac catheterization Right 04/24/2004  . Tonsillectomy  1960's  . Abdominal hysterectomy  1983    "partial"  . Joint replacement    . Shoulder arthroscopy w/ rotator cuff repair Right 01/2010  . Excisional total knee arthroplasty with antibiotic spacers Left 08/2010    "  got infected & had to take 1st replacement out"  . Revision total knee arthroplasty Left 11/2010    "removed spacers; replaced knee"  . Total knee revision with scar debridement/patella revision with poly exchange Left 12/2010    fell; knee split opened; had to redo revision"  . Cardiac catheterization Left 07/2010  . Peripherally inserted central catheter insertion  09/2010  . Colonoscopy  09/06/2008    normal----Dr. Lajoyce Corners   . Right heart catheterization N/A 03/18/2014    Procedure: RIGHT HEART CATH;  Surgeon: Larey Dresser, MD;  Location: [facility] CATH LAB;  Service: Cardiovascular;  Laterality: N/A;  . Esophagogastroduodenoscopy N/A 03/23/2014    Procedure: ESOPHAGOGASTRODUODENOSCOPY (EGD);  Surgeon: Ladene Artist, MD;  Location: [facility] ENDOSCOPY;  Service: Endoscopy;  Laterality: N/A;  . Pericardial tap N/A 05/03/2014    Procedure: PERICARDIAL TAP;  Surgeon: Sinclair Grooms, MD;  Location: [facility] CATH LAB;  Service: Cardiovascular;  Laterality: N/A;    FAMILY HISTORY: Family History  Problem Relation Age of Onset  . Heart disease Mother   . Diabetes Mother   . Diabetes Sister     SOCIAL HISTORY: History   Social History  . Marital Status: Legally Separated    Spouse Name: N/A  . Number of Children: 2  . Years of  Education: 11   Occupational History  . Umemployed   . Disability     Social History Main Topics  . Smoking status: Never Smoker   . Smokeless tobacco: Never Used  . Alcohol Use: No  . Drug Use: No  . Sexual Activity: Not on file   Other Topics Concern  . Not on file   Social History Narrative    FAMILY HISTORY:  Significant for coronary artery disease and diabetes   Patient lives at home with granddaughter.    Patient has 2 children.    Patient has 11 years of education.    Patient is on disability.    Patient is right handed.    Patient is separated.       PHYSICAL EXAM  Filed Vitals:   09/10/14 1058  BP: 106/60  Pulse: 61  Height: _0  (1.626 m)  Weight: 144 lb (65.318 kg)   Body mass index is 24.71 kg/(m^2). PHYSICAL EXAMNIATION:  Gen: NAD, conversant, well nourised, obese, well groomed                     Cardiovascular: Regular rate rhythm, no peripheral edema, warm, nontender. Eyes: Conjunctivae clear without exudates or hemorrhage Neck: Supple, no carotid bruise. Pulmonary: Clear to auscultation bilaterally   NEUROLOGICAL EXAM:  MENTAL STATUS: Speech/Cognition:    Speech is normal; fluent and spontaneous with normal comprehension. Cooperative on examinations Mini-Mental Status Examination is 24 out of 30, she missed 2 out of 3 recalls, has difficulty copy design, spell world backwards.   CRANIAL NERVES: CN II: Visual fields are full to confrontation. Pupil were equal round and reactive CN III, IV, VI: extraocular movement are normal. No ptosis. CN V: Facial sensation is intact to pinprick in all 3 divisions bilaterally. Corneal responses are intact.  CN VII: Face is symmetric with normal eye closure and smile. CN VIII: Hearing is normal to rubbing fingers CN IX, X: Palate elevates symmetrically. Phonation is normal. CN XI: Head turning and shoulder shrug are intact CN XII: Tongue is midline with normal movements and no atrophy.  MOTOR: There is  no pronator drift of out-stretched arms. Muscle bulk and tone  are normal. Muscle strength is normal.  REFLEXES: Reflexes are 2+ and symmetric at the biceps, triceps, knees, and absent absent at ankles. Plantar responses are flexor.  SENSORY: Length dependent decreased light touch, pinprick to distal leg, preserved toe position sense, and vibration sensation. COORDINATION: Rapid alternating movements and fine finger movements are intact. There is no dysmetria on finger-to-nose and heel-knee-shin. There are no abnormal or extraneous movements.   GAIT/STANCE: Stiff, cautious, mildly unsteady gait    DIAGNOSTIC DATA (LABS, IMAGING, TESTING) - I reviewed patient records, labs, notes, testing and imaging myself where available.  Lab Results  Component Value Date   WBC 5.2 09/04/2014   HGB 8.8* 09/04/2014   HCT 27.7* 09/04/2014   MCV 85.5 09/04/2014   PLT 208 09/04/2014      Component Value Date/Time   NA 139 09/04/2014 1145   K 4.4 09/04/2014 1145   CL 102 09/04/2014 1145   CO2 23 09/04/2014 1145   GLUCOSE 85 09/04/2014 1145   BUN 57* 09/04/2014 1145   CREATININE 1.82* 09/04/2014 1145   CREATININE 1.53* 06/05/2014 1129   CALCIUM 9.3 09/04/2014 1145   PROT 8.9* 05/16/2014 1100   ALBUMIN 3.7 05/16/2014 1100   AST 28 05/16/2014 1100   ALT 11 05/16/2014 1100   ALKPHOS 121* 05/16/2014 1100   BILITOT 0.8 05/16/2014 1100   GFRNONAA 29* 09/04/2014 1145   GFRNONAA 35* 06/05/2014 1129   GFRAA 34* 09/04/2014 1145   GFRAA 41* 06/05/2014 1129   Lab Results  Component Value Date   CHOL 98 04/16/2013   HDL 31* 04/16/2013   LDLCALC 48 04/16/2013   LDLDIRECT 62 12/09/2011   TRIG 93 04/16/2013   CHOLHDL 3.2 04/16/2013   Lab Results  Component Value Date   HGBA1C 5.0 09/04/2014    Lab Results  Component Value Date   TSH 8.796* 05/16/2014    ASSESSMENT AND PLAN   64 y.o. year old female  has a past medical history of diabetes, scleroderma, chronic body achy pain,  1.  Peripheral neuropathy, Continue Lyrica 120m tid. She can sleep much better with Nortriptyline 127m2 tabs p oqhs 2, gait difficulty, multifactorial, includes peripheral neuropathy, deconditioning, cervical spondylitic myelopathy, She has multiple medical comorbidities, includes right sided heart failure, long-standing history of scleroderma, she is does not want to consider surgical option, or physical therapy at this point,  3, mild cognitive impairment, Mini-Mental Status Examination is 24 out of 30 today, 4. she should continue moderate exercise, return to clinic in 6 months,    YiMarcial PacasM.D. Ph.D.  [facility] [id number]: [phone]:      [phone]

## 2014-09-11 DIAGNOSIS — Z1231 Encounter for screening mammogram for malignant neoplasm of breast: Secondary | ICD-10-CM | POA: Diagnosis not present

## 2014-09-11 DIAGNOSIS — Z01419 Encounter for gynecological examination (general) (routine) without abnormal findings: Secondary | ICD-10-CM | POA: Diagnosis not present

## 2014-09-11 DIAGNOSIS — Z1272 Encounter for screening for malignant neoplasm of vagina: Secondary | ICD-10-CM | POA: Diagnosis not present

## 2014-09-11 DIAGNOSIS — R87610 Atypical squamous cells of undetermined significance on cytologic smear of cervix (ASC-US): Secondary | ICD-10-CM | POA: Diagnosis not present

## 2014-09-11 DIAGNOSIS — Z124 Encounter for screening for malignant neoplasm of cervix: Secondary | ICD-10-CM | POA: Diagnosis not present

## 2014-09-12 ENCOUNTER — Telehealth: Payer: Self-pay | Admitting: *Deleted

## 2014-09-12 NOTE — Telephone Encounter (Signed)
SPOKE WITH PATIENT, GAVE HER APPOINTMENT/ APPOINTMENT MAILED TO PATIENT.

## 2014-10-02 ENCOUNTER — Ambulatory Visit (INDEPENDENT_AMBULATORY_CARE_PROVIDER_SITE_OTHER): Payer: Medicare Other | Admitting: Internal Medicine

## 2014-10-02 ENCOUNTER — Encounter: Payer: Self-pay | Admitting: Internal Medicine

## 2014-10-02 ENCOUNTER — Telehealth: Payer: Self-pay | Admitting: Licensed Clinical Social Worker

## 2014-10-02 VITALS — BP 88/43 | HR 69 | Temp 98.0°F | Wt 151.1 lb

## 2014-10-02 DIAGNOSIS — E1142 Type 2 diabetes mellitus with diabetic polyneuropathy: Secondary | ICD-10-CM

## 2014-10-02 DIAGNOSIS — L89310 Pressure ulcer of right buttock, unstageable: Secondary | ICD-10-CM

## 2014-10-02 DIAGNOSIS — L89320 Pressure ulcer of left buttock, unstageable: Secondary | ICD-10-CM

## 2014-10-02 DIAGNOSIS — E119 Type 2 diabetes mellitus without complications: Secondary | ICD-10-CM

## 2014-10-02 DIAGNOSIS — L89300 Pressure ulcer of unspecified buttock, unstageable: Secondary | ICD-10-CM

## 2014-10-02 DIAGNOSIS — L89309 Pressure ulcer of unspecified buttock, unspecified stage: Secondary | ICD-10-CM

## 2014-10-02 LAB — GLUCOSE, CAPILLARY: Glucose-Capillary: 98 mg/dL (ref 65–99)

## 2014-10-02 LAB — HM DIABETES EYE EXAM

## 2014-10-02 MED ORDER — HYDROXYZINE HCL 10 MG PO TABS: 10.0000 mg | ORAL_TABLET | Freq: Two times a day (BID) | ORAL | Status: DC | PRN

## 2014-10-02 NOTE — Patient Instructions (Signed)
Please schedule a follow up visit within the next 2 months for diabetes.   For your medications:   Please bring all of your pill  Bottles with you to each visit.  This will help make sure that we have an up to date list of all the medications you are taking.  Please also bring any over the counter herbal medications you are taking (not including advil, tylenol, etc.)  Please continue taking all of your medications as prescribed.  For your wounds, you will have home health come and help with the wound care.   I have also sent in a prescription for hydroxyzine for your itching.  You can take it twice a day.  More information is below.    Thank you!  Hydroxyzine capsules or tablets What is this medicine? HYDROXYZINE (hye [place] i zeen) is an antihistamine. This medicine is used to treat allergy symptoms. It is also used to treat anxiety and tension. This medicine can be used with other medicines to induce sleep before surgery. This medicine may be used for other purposes; ask your health care provider or pharmacist if you have questions. COMMON BRAND NAME(S): ANX, Atarax, Rezine, Vistaril What should I tell my health care provider before I take this medicine? They need to know if you have any of these conditions: -any chronic illness -difficulty passing urine -glaucoma -heart disease -kidney disease -liver disease -lung disease -an unusual or allergic reaction to hydroxyzine, cetirizine, other medicines, foods, dyes, or preservatives -pregnant or trying to get pregnant -breast-feeding How should I use this medicine? Take this medicine by mouth with a full glass of water. Follow the directions on the prescription label. You may take this medicine with food or on an empty stomach. Take your medicine at regular intervals. Do not take your medicine more often than directed. Talk to your pediatrician regarding the use of this medicine in children. Special care may be needed. While this drug  may be prescribed for children as young as 58 years of age for selected conditions, precautions do apply. Patients over 75 years old may have a stronger reaction and need a smaller dose. Overdosage: If you think you have taken too much of this medicine contact a poison control center or emergency room at once. NOTE: This medicine is only for you. Do not share this medicine with others. What if I miss a dose? If you miss a dose, take it as soon as you can. If it is almost time for your next dose, take only that dose. Do not take double or extra doses. What may interact with this medicine? -alcohol -barbiturate medicines for sleep or seizures -medicines for colds, allergies -medicines for depression, anxiety, or emotional disturbances -medicines for pain -medicines for sleep -muscle relaxants This list may not describe all possible interactions. Give your health care provider a list of all the medicines, herbs, non-prescription drugs, or dietary supplements you use. Also tell them if you smoke, drink alcohol, or use illegal drugs. Some items may interact with your medicine. What should I watch for while using this medicine? Tell your doctor or health care professional if your symptoms do not improve. You may get drowsy or dizzy. Do not drive, use machinery, or do anything that needs mental alertness until you know how this medicine affects you. Do not stand or sit up quickly, especially if you are an older patient. This reduces the risk of dizzy or fainting spells. Alcohol may interfere with the effect of this medicine.  Avoid alcoholic drinks. Your mouth may get dry. Chewing sugarless gum or sucking hard candy, and drinking plenty of water may help. Contact your doctor if the problem does not go away or is severe. This medicine may cause dry eyes and blurred vision. If you wear contact lenses you may feel some discomfort. Lubricating drops may help. See your eye doctor if the problem does not go away  or is severe. If you are receiving skin tests for allergies, tell your doctor you are using this medicine. What side effects may I notice from receiving this medicine? Side effects that you should report to your doctor or health care professional as soon as possible: -fast or irregular heartbeat -difficulty passing urine -seizures -slurred speech or confusion -tremor Side effects that usually do not require medical attention (report to your doctor or health care professional if they continue or are bothersome): -constipation -drowsiness -fatigue -headache -stomach upset This list may not describe all possible side effects. Call your doctor for medical advice about side effects. You may report side effects to FDA at 1-800-FDA-1088. Where should I keep my medicine? Keep out of the reach of children. Store at room temperature between 15 and 30 degrees C (59 and 86 degrees F). Keep container tightly closed. Throw away any unused medicine after the expiration date. NOTE: This sheet is a summary. It may not cover all possible information. If you have questions about this medicine, talk to your doctor, pharmacist, or health care provider.  2015, Elsevier/Gold Standard. (2007-07-14 14:50:59)

## 2014-10-02 NOTE — Telephone Encounter (Signed)
[name] was referred to CSW for home health services.  CSW placed call to [name] to determine agency of choice.  Pt has used [facility] in the past.  [name] was satisfied with [facility] and declines referral to another agency.  Referral faxed to [facility].  CSW followed up with call to completed referral.

## 2014-10-02 NOTE — Progress Notes (Signed)
Subjective:    Patient ID: [name], female    DOB: 09/21/1950, 64 y.o.   MRN: [id number]  CC: Sores on hips X 2 weeks  HPI   Ms. Ryer is a 64yo woman with PMH of PAH due to scleroderma, Right sided heart failure, DM (last elevated A1C in 2013, on no meds), polyneuropathy, chronic back pain, chronic respiratory failure, GERD, HTN who presents for acute visit for sores on her hips.    Ms. Newport reports that about 2 weeks ago she woke up and noticed some sores on her hips. At first they hurt, particularly on her hips, but also down her legs.  She took some excedrin which helped, and now they don't feel as bad.  She does not report any trauma, tight fitting clothes or injury that may have triggered them.  She is unsure if they drained anything, but she denies fever, chills or warmth.  They do itch.  She did not have a preceding rash or illness.  Her main issue right now is itching at the site.  She is actually the most interactive and pleasant than I have seen her in a while.  She reports her medications for PAH are helping and she feels good, except for the sores on her hips.  Ms. Korber reports that she sits in the same chair most days, she does get up to walk around, however, family in the room noted that this was limited.  She is sitting on a padded chair.    Of note, she has recently seen her Cardiologist and is being switched to selexipag for her [place].  She also takes amiodarone, ambrisentan and adcirca. None of these medications are obvious causes.  Selexipag can cause a rash, however.  She has recently completed a course of bactrim for paronychia of the finger.  She is due to see the surgeon for this later this month, however, her finger is much improved today.  She no longer has any drainage and her skin is almost back to normal.  She completed the course of bactrim.    Review of Systems  Constitutional: Negative for fever, chills and activity change.  Musculoskeletal: Negative for  back pain and arthralgias.       Muscle aching  Skin: Positive for color change and wound. Negative for pallor and rash.       No diaphoresis or erythema  Neurological: Negative for dizziness and light-headedness.       Objective:   Physical Exam  Constitutional: She is oriented to person, place, and time. No distress.  This woman, appears older than stated age  HENT:  Head: Normocephalic and atraumatic.  Eyes: Conjunctivae are normal. No scleral icterus.  Abdominal: Soft. She exhibits distension (chronic). There is no tenderness.  Musculoskeletal: She exhibits edema. She exhibits no tenderness.  Neurological: She is alert and oriented to person, place, and time.  Skin: Skin is warm. No rash noted. No erythema.  She has two pressure wounds on buttock parallel to each other, could be sheer injury vs. Related to current place she sits.  They are shallow with greenish tissue covering, under which is pink skin.  They are quarter in size each.  No further injury, rash or erythema.  They are not draining. The appear to be unstageable pressure ulcers due to greenish eschar overlying skin.   Psychiatric: She has a normal mood and affect. Her behavior is normal.       Assessment & Plan:  RTC  in 1 month for DM check as previously scheduled

## 2014-10-03 DIAGNOSIS — L89300 Pressure ulcer of unspecified buttock, unstageable: Secondary | ICD-10-CM | POA: Insufficient documentation

## 2014-10-03 NOTE — Assessment & Plan Note (Signed)
Assessment: Pressure ulcers, likely related to sitting in one place, one chair for prolonged period of time.  She has some tenderness, but mainly itching.  They are bilateral and unstageable as noted above.    Plan:  Silicone gel adhesive bandages were placed today.  Home Health Nursing for wound care order placed Hydroxyzine for itching, information about common side effects given We also discussed other ways to help these heal which included changing position regularly, changing chairs, walking around the room hourly, not lying on back in the bed.  I advised her against use of a medical pillow or donut.

## 2014-10-07 ENCOUNTER — Ambulatory Visit (HOSPITAL_COMMUNITY)
Admission: RE | Admit: 2014-10-07 | Discharge: 2014-10-07 | Disposition: A | Discharge status: Home / Self Care | Payer: Medicare Other | Source: Ambulatory Visit | Attending: Internal Medicine | Admitting: Internal Medicine

## 2014-10-07 ENCOUNTER — Encounter (HOSPITAL_COMMUNITY): Payer: Self-pay

## 2014-10-07 VITALS — BP 84/39 | HR 74 | Resp 18 | Wt 147.5 lb

## 2014-10-07 DIAGNOSIS — I509 Heart failure, unspecified: Secondary | ICD-10-CM

## 2014-10-07 DIAGNOSIS — I480 Paroxysmal atrial fibrillation: Secondary | ICD-10-CM

## 2014-10-07 DIAGNOSIS — I50810 Right heart failure, unspecified: Secondary | ICD-10-CM

## 2014-10-07 DIAGNOSIS — I272 Pulmonary hypertension, unspecified: Secondary | ICD-10-CM

## 2014-10-07 DIAGNOSIS — N183 Chronic kidney disease, stage 3 (moderate): Secondary | ICD-10-CM | POA: Diagnosis not present

## 2014-10-07 DIAGNOSIS — I313 Pericardial effusion (noninflammatory): Secondary | ICD-10-CM | POA: Insufficient documentation

## 2014-10-07 DIAGNOSIS — I270 Primary pulmonary hypertension: Secondary | ICD-10-CM

## 2014-10-07 DIAGNOSIS — I5032 Chronic diastolic (congestive) heart failure: Secondary | ICD-10-CM

## 2014-10-07 NOTE — Progress Notes (Signed)
Patient ID: JANKI DIKE, female   DOB: [birth year], 64 y.o.   MRN: [id number] Pulmonary: Dr Lamonte Sakai  Primary Heart Failure: Dr Haroldine Laws  HPI: Ms. Cheetham is a 64 yo female with a history of DM2, HLD, HTN, pHTN, GERD, gastroparesis, scleroderma, DJD and diastolic HF.   Of note she has been on several PAH targeted therapies and coumadin over last 9 years, but has not been reliably on therapy due to noncompliance and inability to have labs performed. She has had best response to Tyvaso (+ bosentan) with an improvement in 6 minute walk and in PASP from 102 mmHg (7/09) to 42 mmHg (9/10). After stopping Tyvaso her PASP rose to 80's by TTE 6/13 and 11/14. Surprisingly, she has never required supplemental O2. Macitentan was started in 11/14. She developed nausea and diarrhea and macitentan was stopped early 3/15. Most recently, she had been on Letairis and Adcirca (followed by Dr. Lamonte Sakai).   Admitted 10/14-10/18/15 for A/C RHF, volume overload and syncope. Diuresed with IV lasix and milrinone a total of 26 lbs. Discharge weight was 156 lbs and started on lasix 40 mg BID.   Admitted 12/29 with dyspnea and chest pain. BP was low, and Lasix and Adcirca were both initially held. Echo showed EF 60-65% with moderately dilated and dysfunctional RV, PASP 111 mmHg with moderate TR. There was a large pericardial effusion without definite tamponade. Presentation was suspected to be due to progressive PAH with secondary RV failure rather than tamponade. No pericardiocentesis yet. She was started on milrinone gtt and diuresed. Shorter-acting sildenafil was started instead of Adcirca. Also had GI bleed. 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 Discharge weight was 146 pounds.   Echo (1/16) with EF 55-60%, severely dilated RV with moderately decreased systolic function, large pericardial effusion.   She was admitted in 2/16 with acute on chronic diastolic CHF/RV failure and  hypotension.  Echo (2/16) showed EF 60%, moderate-severe RV dilation with moderate-severe RV systolic dysfunction, PA systolic pressure 62 mmHg, large pericardial effusion with borderline tamponade.  She had pericardiocentesis with improvement in symptoms.  No malignant cells in pericardial fluid.  Repeat echo showed no pericardial effusion.  She developed paroxysmal atrial fibrillation on 2/19 but went back into NSR on amiodarone.  She was not anticoagulated given history of GI bleeding and pericardial effusion.   She is stable today.  She has weaned off tyvaso and currently on selexipag 800 + 200 mcg. Ongoing fatigue. Remains short of breath with exertion but says its better. Denies syncope/presyncope.  Weight at home 147 pounds. Not using oxygen. Taking all medications.   6 min walk (05/16/14): 125 m 6 min walk (6/16): 146 m  Echo (1/16) with EF 55-60%, severely dilated RA with moderately decreased systolic function, large pericardial effusion.  ECHO 05/09/2014: EF 60-65% Peak PA pressure 52  ECHO 06/05/14: EF 55-60%, RV moderately dilated with moderately decreased systolic function with D-shaped interventricular septum. Severe RAE. RVSP 59. No pericardial effusion.  RHC (1/16):  Hemodynamics (mmHg) RA mean 12 RV 64/14 PA 68/29, mean 42 PCWP mean 12 LV 102/14 AO 105/53 PA 67% AO 93% Cardiac Output (Fick) 6.63  Cardiac Index (Fick) 3.88  PVR 4.5 WU  Labs 03/24/2014 K 3.8 Creatinine 1.51 Hgb 9.3  Labs 2/16 BNP 121, K 4.4, creatinine 1.27, hgb 9.2 Labs 2/16 K 3.8, creatinine 1.58, AST 45, ALT 12 Labs 05/22/2014: Creatinine 2.1 K 3.7  Labs 06/05/2014: Creatinine 1.5 K 3.7, LFTs normal [facility]  6/16: K 4.4, creatinine 1.82, hgb 8.8  ROS: All systems negative except as listed in HPI, PMH and Problem List.  SH:  History   Social History  . Marital Status: Legally Separated    Spouse Name: N/A  . Number of Children: 2  . Years of Education: 11   Occupational History  . Umemployed   .  Disability     Social History Main Topics  . Smoking status: Never Smoker   . Smokeless tobacco: Never Used  . Alcohol Use: No  . Drug Use: No  . Sexual Activity: Not on file   Other Topics Concern  . Not on file   Social History Narrative    FAMILY HISTORY:  Significant for coronary artery disease and diabetes   Patient lives at home with granddaughter.    Patient has 2 children.    Patient has 11 years of education.    Patient is on disability.    Patient is right handed.    Patient is separated.      FH:  Family History  Problem Relation Age of Onset  . Heart disease Mother   . Diabetes Mother   . Diabetes Sister     Past Medical History  Diagnosis Date  . Secondary pulmonary hypertension     right heart cath 04/20/04  . Cough   . Allergic rhinitis, cause unspecified   . Esophageal reflux   . Systemic sclerosis   . Unspecified essential hypertension   . Gastritis   . Sickle cell trait     "trace"  . Obesity   . Visual changes   . Scleroderma   . Diastolic dysfunction   . Trichomonas   . Vaginal bleeding   . Neuropathy   . CHF (congestive heart failure)   . Type II diabetes mellitus     DIET CONTROL   . Bursitis   . History of blood transfusion     "think it was related to when I had partial hysteretomy; might have been for one of my knee ORs"  . Degeneration of lumbar or lumbosacral intervertebral disc   . Arthritis     "all over my body" (08/04/2013)  . Chronic kidney disease     "been in hospital for it; don't know what kind" (08/04/2013)    Current Outpatient Prescriptions  Medication Sig Dispense Refill  . ADCIRCA 20 MG TABS TAKE 2 TABLETS (40MG) BY MOUTH ONCE DAILY WITH OR WITHOUT FOOD. CALL [phone] FOR REFILLS. 60 tablet 11  . ambrisentan (LETAIRIS) 10 MG tablet Take 1 tablet (10 mg total) by mouth daily. 30 tablet 11  . amiodarone (PACERONE) 200 MG tablet Take 1 tablet (200 mg total) by mouth daily. 30 tablet 6  . esomeprazole (NEXIUM) 40  MG capsule Take 40 mg by mouth 2 (two) times daily.     . fenofibrate 54 MG tablet Take 1 tablet (54 mg total) by mouth daily. 30 tablet 12  . ferrous sulfate ([employer] IRON) 325 (65 FE) MG tablet Take 325 mg by mouth daily with breakfast.    . FLUoxetine (PROZAC) 20 MG capsule take 1 capsule by mouth every morning 30 capsule 2  . furosemide (LASIX) 80 MG tablet Take 1 tablet (80 mg total) by mouth 2 (two) times daily. 90 tablet 3  . HYDROcodone-acetaminophen (NORCO) 10-325 MG per tablet Take 1 tablet by mouth every 6 (six) hours as needed for moderate pain.     . hydrOXYzine (ATARAX/VISTARIL) 10 MG tablet Take 1  tablet (10 mg total) by mouth 2 (two) times daily as needed for itching. 20 tablet 0  . nortriptyline (PAMELOR) 10 MG capsule 2 tabs po qhs 60 capsule 11  . pregabalin (LYRICA) 150 MG capsule Take 1 capsule (150 mg total) by mouth 3 (three) times daily. 90 capsule 5  . promethazine (PHENERGAN) 12.5 MG tablet Take 1 tablet (12.5 mg total) by mouth every 6 (six) hours as needed for nausea or vomiting. 45 tablet 1   No current facility-administered medications for this encounter.    Filed Vitals:   10/07/14 1348  BP: 84/39  Pulse: 74  Resp: 18  Weight: 147 lb 8 oz (66.906 kg)  SpO2: 91%    PHYSICAL EXAM:  General: NAD. Sitting chair Neck: JVP 7-8 cm, no thyromegaly or thyroid nodule.  Lungs: Decreased in the bases otherwise clean.   CV: RV heave. Heart regular S1/S2 with widely split S2,  2/6 HSM LLSB. No edema.  Abdomen: Soft, nontender, no hepatosplenomegaly. Minimally distended.   Neurologic: Alert and oriented x 3.  Psych: Normal affect. Extremities: No clubbing or cyanosis.   ASSESSMENT & PLAN: 1. 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  Echo 05/2014 showed EF 55-60% with moderately dilated and moderately dysfunctional RV. Pericardial effusion had resolved. Repeat ECHO in a couple months.   NYHA class III symptoms.  - Continue Lasix 80  mg bid.SBP soft but not symptomatic.  2. PAH with cor pulmonale: Severe PAH.  Had recent [place] in 1/16. - Continue ambrisentan 10 mg daily. Class III but thinks its a little better.  - She has been weaned off Tyvaso and on 800 +200 mcg  selexipag twice a day.  - Continue Adcirca 40 mg daily.  3. CKD III: BMET stable in 09/04/14.  4. 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  She is no longer anticoagulated.  5. Pericardial effusion: Resolved after pericardiocentesis. Repeat echo in 3-6 months. , 6. Atrial fibrillation: Regular rhythm. Paroxysmal.  Continue amiodarone. She is not anticoagulated due to h/o GI bleeding.  LFTs and TSH ok 05/22/2014. She  will need a yearly eye exam while on amiodarone.   Follow up in 3-4 weeks. Next visit set up for repeat ECHO. Needs 6MW next visit.    CLEGG,AMY NP-C  10/07/2014

## 2014-10-07 NOTE — Patient Instructions (Signed)
Follow up 4 weeks with echocardiogram.  Do the following things EVERYDAY: 1) Weigh yourself in the morning before breakfast. Write it down and keep it in a log. 2) Take your medicines as prescribed 3) Eat low salt foods-Limit salt (sodium) to 2000 mg per day.  4) Stay as active as you can everyday 5) Limit all fluids for the day to less than 2 liters

## 2014-10-11 NOTE — Telephone Encounter (Signed)
Checked [facility] providers for [name]  [facility] in-network, referral faxed as to not delay services.

## 2014-10-11 NOTE — Telephone Encounter (Signed)
CSW placed call to [facility] to obtain [facility].  CSW was informed [facility] declined referral due to inability to staff.  CSW voiced complaint to [facility] as fax cover requested call back if unable to accept referral.  CSW placed called to pt to obtain another agency of choice.  CSW left message requesting return call. CSW provided contact hours and phone number.

## 2014-10-14 DIAGNOSIS — E1140 Type 2 diabetes mellitus with diabetic neuropathy, unspecified: Secondary | ICD-10-CM | POA: Diagnosis not present

## 2014-10-14 DIAGNOSIS — I10 Essential (primary) hypertension: Secondary | ICD-10-CM | POA: Diagnosis not present

## 2014-10-14 DIAGNOSIS — I270 Primary pulmonary hypertension: Secondary | ICD-10-CM | POA: Diagnosis not present

## 2014-10-14 DIAGNOSIS — L89322 Pressure ulcer of left buttock, stage 2: Secondary | ICD-10-CM | POA: Diagnosis not present

## 2014-10-14 DIAGNOSIS — J9610 Chronic respiratory failure, unspecified whether with hypoxia or hypercapnia: Secondary | ICD-10-CM | POA: Diagnosis not present

## 2014-10-14 DIAGNOSIS — L89312 Pressure ulcer of right buttock, stage 2: Secondary | ICD-10-CM | POA: Diagnosis not present

## 2014-10-14 DIAGNOSIS — I509 Heart failure, unspecified: Secondary | ICD-10-CM | POA: Diagnosis not present

## 2014-10-15 ENCOUNTER — Telehealth: Payer: Self-pay | Admitting: Internal Medicine

## 2014-10-15 NOTE — Telephone Encounter (Signed)
[facility] Nurse requesting wound care order and flam dressing.

## 2014-10-15 NOTE — Telephone Encounter (Signed)
Foam pad over the areas, areas look pink, superficial open places, no drainage noted, HHN placed smal foam pads to the area and loosely covered she does think they will heal very soon w/o any gels or creams. Do you approve? Also she would like to have verbal for visits: 2 x per week for 3 weeks 1 x per week for 2 weeks 2 prn visits if needed Verbal approval given, do you agree?

## 2014-10-15 NOTE — Telephone Encounter (Signed)
Agree  Thank you

## 2014-10-15 NOTE — Telephone Encounter (Signed)
Called laura back left vmail for a rtc

## 2014-10-16 DIAGNOSIS — L89322 Pressure ulcer of left buttock, stage 2: Secondary | ICD-10-CM | POA: Diagnosis not present

## 2014-10-16 DIAGNOSIS — L89312 Pressure ulcer of right buttock, stage 2: Secondary | ICD-10-CM | POA: Diagnosis not present

## 2014-10-16 DIAGNOSIS — J9610 Chronic respiratory failure, unspecified whether with hypoxia or hypercapnia: Secondary | ICD-10-CM | POA: Diagnosis not present

## 2014-10-16 DIAGNOSIS — I270 Primary pulmonary hypertension: Secondary | ICD-10-CM | POA: Diagnosis not present

## 2014-10-16 DIAGNOSIS — E1140 Type 2 diabetes mellitus with diabetic neuropathy, unspecified: Secondary | ICD-10-CM | POA: Diagnosis not present

## 2014-10-16 DIAGNOSIS — I10 Essential (primary) hypertension: Secondary | ICD-10-CM | POA: Diagnosis not present

## 2014-10-16 DIAGNOSIS — I509 Heart failure, unspecified: Secondary | ICD-10-CM | POA: Diagnosis not present

## 2014-10-17 ENCOUNTER — Other Ambulatory Visit: Payer: Self-pay | Admitting: Obstetrics and Gynecology

## 2014-10-17 ENCOUNTER — Telehealth: Payer: Self-pay | Admitting: Emergency Medicine

## 2014-10-17 DIAGNOSIS — N6320 Unspecified lump in the left breast, unspecified quadrant: Secondary | ICD-10-CM

## 2014-10-17 NOTE — Telephone Encounter (Signed)
ATC and was given option for VM--lmtcb x1

## 2014-10-18 ENCOUNTER — Encounter: Payer: Self-pay | Admitting: Dietician

## 2014-10-18 DIAGNOSIS — L89312 Pressure ulcer of right buttock, stage 2: Secondary | ICD-10-CM | POA: Diagnosis not present

## 2014-10-18 DIAGNOSIS — J9610 Chronic respiratory failure, unspecified whether with hypoxia or hypercapnia: Secondary | ICD-10-CM | POA: Diagnosis not present

## 2014-10-18 DIAGNOSIS — L89322 Pressure ulcer of left buttock, stage 2: Secondary | ICD-10-CM | POA: Diagnosis not present

## 2014-10-18 DIAGNOSIS — E1140 Type 2 diabetes mellitus with diabetic neuropathy, unspecified: Secondary | ICD-10-CM | POA: Diagnosis not present

## 2014-10-18 DIAGNOSIS — I10 Essential (primary) hypertension: Secondary | ICD-10-CM | POA: Diagnosis not present

## 2014-10-18 DIAGNOSIS — I509 Heart failure, unspecified: Secondary | ICD-10-CM | POA: Diagnosis not present

## 2014-10-18 DIAGNOSIS — I270 Primary pulmonary hypertension: Secondary | ICD-10-CM | POA: Diagnosis not present

## 2014-10-18 NOTE — Telephone Encounter (Signed)
Patient Instructions     Please continue your current [name], and [name] as you have been taking them Continue your [name] as prescribed by cardiology Follow with cardiology as planned We will plan to repeat your lab work at least 4 times a year to monitor your medications.  Follow with Dr [name] in 6 months or sooner if you have any problems    Spoke with [name] at [employer]. States that the pt told them that we discontinued this medication. According to our records from March 2016, RB wanted her to continue [name]. lmtcb x1 for pt.

## 2014-10-21 DIAGNOSIS — J9610 Chronic respiratory failure, unspecified whether with hypoxia or hypercapnia: Secondary | ICD-10-CM | POA: Diagnosis not present

## 2014-10-21 DIAGNOSIS — E1140 Type 2 diabetes mellitus with diabetic neuropathy, unspecified: Secondary | ICD-10-CM | POA: Diagnosis not present

## 2014-10-21 DIAGNOSIS — L89312 Pressure ulcer of right buttock, stage 2: Secondary | ICD-10-CM | POA: Diagnosis not present

## 2014-10-21 DIAGNOSIS — L89322 Pressure ulcer of left buttock, stage 2: Secondary | ICD-10-CM | POA: Diagnosis not present

## 2014-10-21 DIAGNOSIS — I509 Heart failure, unspecified: Secondary | ICD-10-CM | POA: Diagnosis not present

## 2014-10-21 DIAGNOSIS — I10 Essential (primary) hypertension: Secondary | ICD-10-CM | POA: Diagnosis not present

## 2014-10-21 DIAGNOSIS — I270 Primary pulmonary hypertension: Secondary | ICD-10-CM | POA: Diagnosis not present

## 2014-10-21 NOTE — Telephone Encounter (Signed)
Thank you. I will review the cardiology notes

## 2014-10-21 NOTE — Telephone Encounter (Signed)
Per Cardiology note from 7.25.16: She is stable today. She has weaned off tyvaso and currently on selexipag 800 + 200 mcg. Ongoing fatigue. Remains short of breath with exertion but says its better. Denies syncope/presyncope. Weight at home 147 pounds. Not using oxygen. Taking all medications   Called and spoke to [place] at [employer] [facility]. Informed her the pt is no longer on Tyvaso per Cardiology. Bethena Roys verbalized understanding.  Will send to [facility] as FYI.

## 2014-10-22 ENCOUNTER — Other Ambulatory Visit (HOSPITAL_COMMUNITY): Payer: Self-pay | Admitting: *Deleted

## 2014-10-22 ENCOUNTER — Other Ambulatory Visit: Payer: Medicare Other

## 2014-10-22 MED ORDER — SELEXIPAG 1600 MCG PO TABS: 1600.0000 ug | ORAL_TABLET | Freq: Two times a day (BID) | ORAL | Status: DC

## 2014-10-24 DIAGNOSIS — J9610 Chronic respiratory failure, unspecified whether with hypoxia or hypercapnia: Secondary | ICD-10-CM | POA: Diagnosis not present

## 2014-10-24 DIAGNOSIS — L89322 Pressure ulcer of left buttock, stage 2: Secondary | ICD-10-CM | POA: Diagnosis not present

## 2014-10-24 DIAGNOSIS — I509 Heart failure, unspecified: Secondary | ICD-10-CM | POA: Diagnosis not present

## 2014-10-24 DIAGNOSIS — I10 Essential (primary) hypertension: Secondary | ICD-10-CM | POA: Diagnosis not present

## 2014-10-24 DIAGNOSIS — L89312 Pressure ulcer of right buttock, stage 2: Secondary | ICD-10-CM | POA: Diagnosis not present

## 2014-10-24 DIAGNOSIS — I270 Primary pulmonary hypertension: Secondary | ICD-10-CM | POA: Diagnosis not present

## 2014-10-24 DIAGNOSIS — E1140 Type 2 diabetes mellitus with diabetic neuropathy, unspecified: Secondary | ICD-10-CM | POA: Diagnosis not present

## 2014-10-29 DIAGNOSIS — I509 Heart failure, unspecified: Secondary | ICD-10-CM | POA: Diagnosis not present

## 2014-10-29 DIAGNOSIS — I270 Primary pulmonary hypertension: Secondary | ICD-10-CM | POA: Diagnosis not present

## 2014-10-29 DIAGNOSIS — L89322 Pressure ulcer of left buttock, stage 2: Secondary | ICD-10-CM | POA: Diagnosis not present

## 2014-10-29 DIAGNOSIS — E1140 Type 2 diabetes mellitus with diabetic neuropathy, unspecified: Secondary | ICD-10-CM | POA: Diagnosis not present

## 2014-10-29 DIAGNOSIS — L89312 Pressure ulcer of right buttock, stage 2: Secondary | ICD-10-CM | POA: Diagnosis not present

## 2014-10-29 DIAGNOSIS — J9610 Chronic respiratory failure, unspecified whether with hypoxia or hypercapnia: Secondary | ICD-10-CM | POA: Diagnosis not present

## 2014-10-29 DIAGNOSIS — I10 Essential (primary) hypertension: Secondary | ICD-10-CM | POA: Diagnosis not present

## 2014-10-31 DIAGNOSIS — J9610 Chronic respiratory failure, unspecified whether with hypoxia or hypercapnia: Secondary | ICD-10-CM | POA: Diagnosis not present

## 2014-10-31 DIAGNOSIS — I509 Heart failure, unspecified: Secondary | ICD-10-CM | POA: Diagnosis not present

## 2014-10-31 DIAGNOSIS — E1140 Type 2 diabetes mellitus with diabetic neuropathy, unspecified: Secondary | ICD-10-CM | POA: Diagnosis not present

## 2014-10-31 DIAGNOSIS — I270 Primary pulmonary hypertension: Secondary | ICD-10-CM | POA: Diagnosis not present

## 2014-10-31 DIAGNOSIS — I10 Essential (primary) hypertension: Secondary | ICD-10-CM | POA: Diagnosis not present

## 2014-10-31 DIAGNOSIS — L89322 Pressure ulcer of left buttock, stage 2: Secondary | ICD-10-CM | POA: Diagnosis not present

## 2014-10-31 DIAGNOSIS — L89312 Pressure ulcer of right buttock, stage 2: Secondary | ICD-10-CM | POA: Diagnosis not present

## 2014-10-31 NOTE — Telephone Encounter (Signed)
Spoke with dr Daryll Drown, she gave verbal agreement
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
## 2014-11-01 ENCOUNTER — Ambulatory Visit: Payer: Medicare Other | Admitting: Internal Medicine

## 2014-11-01 ENCOUNTER — Encounter: Payer: Self-pay | Admitting: Internal Medicine

## 2014-11-01 NOTE — Telephone Encounter (Signed)
Agree with plan 

## 2014-11-05 ENCOUNTER — Other Ambulatory Visit (HOSPITAL_COMMUNITY): Payer: Self-pay | Admitting: Internal Medicine

## 2014-11-05 ENCOUNTER — Ambulatory Visit (HOSPITAL_BASED_OUTPATIENT_CLINIC_OR_DEPARTMENT_OTHER)
Admission: RE | Admit: 2014-11-05 | Discharge: 2014-11-05 | Disposition: A | Discharge status: Home / Self Care | Payer: Medicare Other | Source: Ambulatory Visit | Attending: Cardiology | Admitting: Cardiology

## 2014-11-05 ENCOUNTER — Ambulatory Visit (HOSPITAL_COMMUNITY)
Admission: RE | Admit: 2014-11-05 | Discharge: 2014-11-05 | Disposition: A | Discharge status: Home / Self Care | Payer: Medicare Other | Source: Ambulatory Visit | Attending: Internal Medicine | Admitting: Internal Medicine

## 2014-11-05 VITALS — BP 90/48 | HR 68 | Wt 148.4 lb

## 2014-11-05 DIAGNOSIS — K3184 Gastroparesis: Secondary | ICD-10-CM | POA: Diagnosis not present

## 2014-11-05 DIAGNOSIS — M349 Systemic sclerosis, unspecified: Secondary | ICD-10-CM | POA: Diagnosis not present

## 2014-11-05 DIAGNOSIS — I5032 Chronic diastolic (congestive) heart failure: Secondary | ICD-10-CM | POA: Diagnosis not present

## 2014-11-05 DIAGNOSIS — E1140 Type 2 diabetes mellitus with diabetic neuropathy, unspecified: Secondary | ICD-10-CM | POA: Diagnosis not present

## 2014-11-05 DIAGNOSIS — E785 Hyperlipidemia, unspecified: Secondary | ICD-10-CM | POA: Insufficient documentation

## 2014-11-05 DIAGNOSIS — I5022 Chronic systolic (congestive) heart failure: Secondary | ICD-10-CM | POA: Diagnosis not present

## 2014-11-05 DIAGNOSIS — I129 Hypertensive chronic kidney disease with stage 1 through stage 4 chronic kidney disease, or unspecified chronic kidney disease: Secondary | ICD-10-CM | POA: Diagnosis not present

## 2014-11-05 DIAGNOSIS — I509 Heart failure, unspecified: Secondary | ICD-10-CM

## 2014-11-05 DIAGNOSIS — K219 Gastro-esophageal reflux disease without esophagitis: Secondary | ICD-10-CM | POA: Diagnosis not present

## 2014-11-05 DIAGNOSIS — I272 Other secondary pulmonary hypertension: Secondary | ICD-10-CM | POA: Insufficient documentation

## 2014-11-05 DIAGNOSIS — E1143 Type 2 diabetes mellitus with diabetic autonomic (poly)neuropathy: Secondary | ICD-10-CM | POA: Insufficient documentation

## 2014-11-05 DIAGNOSIS — Z8249 Family history of ischemic heart disease and other diseases of the circulatory system: Secondary | ICD-10-CM | POA: Insufficient documentation

## 2014-11-05 DIAGNOSIS — E1122 Type 2 diabetes mellitus with diabetic chronic kidney disease: Secondary | ICD-10-CM | POA: Diagnosis not present

## 2014-11-05 DIAGNOSIS — Z79899 Other long term (current) drug therapy: Secondary | ICD-10-CM | POA: Diagnosis not present

## 2014-11-05 DIAGNOSIS — N183 Chronic kidney disease, stage 3 unspecified: Secondary | ICD-10-CM

## 2014-11-05 DIAGNOSIS — I480 Paroxysmal atrial fibrillation: Secondary | ICD-10-CM | POA: Insufficient documentation

## 2014-11-05 DIAGNOSIS — Z833 Family history of diabetes mellitus: Secondary | ICD-10-CM | POA: Insufficient documentation

## 2014-11-05 DIAGNOSIS — D573 Sickle-cell trait: Secondary | ICD-10-CM | POA: Diagnosis not present

## 2014-11-05 DIAGNOSIS — I270 Primary pulmonary hypertension: Secondary | ICD-10-CM | POA: Diagnosis not present

## 2014-11-05 DIAGNOSIS — I50812 Chronic right heart failure: Secondary | ICD-10-CM

## 2014-11-05 DIAGNOSIS — I50810 Right heart failure, unspecified: Secondary | ICD-10-CM

## 2014-11-05 LAB — BASIC METABOLIC PANEL
Anion gap: 9 (ref 5–15)
BUN: 52 mg/dL — AB (ref 6–20)
CHLORIDE: 106 mmol/L (ref 101–111)
CO2: 25 mmol/L (ref 22–32)
CREATININE: 1.8 mg/dL — AB (ref 0.44–1.00)
Calcium: 9.2 mg/dL (ref 8.9–10.3)
GFR calc Af Amer: 33 mL/min — ABNORMAL LOW (ref >60)
GFR calc non Af Amer: 29 mL/min — ABNORMAL LOW (ref >60)
Glucose, Bld: 78 mg/dL (ref 65–99)
POTASSIUM: 4.4 mmol/L (ref 3.5–5.1)
Sodium: 140 mmol/L (ref 135–145)

## 2014-11-05 LAB — BRAIN NATRIURETIC PEPTIDE: B Natriuretic Peptide: 551.7 pg/mL — ABNORMAL HIGH (ref 0.0–100.0)

## 2014-11-05 NOTE — Progress Notes (Signed)
Echocardiogram 2D Echocardiogram limited has been performed.  [name] 11/05/2014, 11:01 AM

## 2014-11-05 NOTE — Progress Notes (Signed)
6 min walk test completed, pt ambulated 600 ft (182 m) O2 sat ranged from 95-98% on RA and HR ranged from 69-79, pt tolerated well

## 2014-11-05 NOTE — Patient Instructions (Signed)
Labs today  Your physician recommends that you schedule a follow-up appointment in: 2 months  Do the following things EVERYDAY: 1) Weigh yourself in the morning before breakfast. Write it down and keep it in a log. 2) Take your medicines as prescribed 3) Eat low salt foods-Limit salt (sodium) to 2000 mg per day.  4) Stay as active as you can everyday 5) Limit all fluids for the day to less than 2 liters 6)

## 2014-11-06 ENCOUNTER — Telehealth: Payer: Self-pay | Admitting: *Deleted

## 2014-11-06 DIAGNOSIS — J9610 Chronic respiratory failure, unspecified whether with hypoxia or hypercapnia: Secondary | ICD-10-CM | POA: Diagnosis not present

## 2014-11-06 DIAGNOSIS — I270 Primary pulmonary hypertension: Secondary | ICD-10-CM | POA: Diagnosis not present

## 2014-11-06 DIAGNOSIS — E1140 Type 2 diabetes mellitus with diabetic neuropathy, unspecified: Secondary | ICD-10-CM | POA: Diagnosis not present

## 2014-11-06 DIAGNOSIS — I10 Essential (primary) hypertension: Secondary | ICD-10-CM | POA: Diagnosis not present

## 2014-11-06 DIAGNOSIS — L89322 Pressure ulcer of left buttock, stage 2: Secondary | ICD-10-CM | POA: Diagnosis not present

## 2014-11-06 DIAGNOSIS — I509 Heart failure, unspecified: Secondary | ICD-10-CM | POA: Diagnosis not present

## 2014-11-06 DIAGNOSIS — L89312 Pressure ulcer of right buttock, stage 2: Secondary | ICD-10-CM | POA: Diagnosis not present

## 2014-11-06 NOTE — Progress Notes (Signed)
Patient ID: [name], female   DOB: 10-30-1950, 64 y.o.   MRN: [id number] Pulmonary: Dr Lamonte Sakai   HPI: [name] is a 64 yo female with a history of DM2, HLD, HTN, pHTN, GERD, gastroparesis, scleroderma, DJD and diastolic HF.   Of note she has been on several PAH targeted therapies and coumadin over last 9 years, but has not been reliably on therapy due to noncompliance and inability to have labs performed. She has had best response to Tyvaso (+ bosentan) with an improvement in 6 minute walk and in PASP from 102 mmHg (7/09) to 42 mmHg (9/10). After stopping Tyvaso her PASP rose to 80's by TTE 6/13 and 11/14. Surprisingly, she has never required supplemental O2. Macitentan was started in 11/14. She developed nausea and diarrhea and macitentan was stopped early 3/15. Most recently, she had been on Letairis and Adcirca (followed by Dr. Lamonte Sakai).   Admitted 10/14-10/18/15 for A/C RHF, volume overload and syncope. Diuresed with IV lasix and milrinone a total of 26 lbs. Discharge weight was 156 lbs and started on lasix 40 mg BID.   Admitted 12/29 with dyspnea and chest pain. BP was low, and Lasix and Adcirca were both initially held. Echo showed EF 60-65% with moderately dilated and dysfunctional RV, PASP 111 mmHg with moderate TR. There was a large pericardial effusion without definite tamponade. Presentation was suspected to be due to progressive PAH with secondary RV failure rather than tamponade. No pericardiocentesis yet. She was started on milrinone gtt and diuresed. Shorter-acting sildenafil was started instead of Adcirca. Also had GI bleed. 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 Discharge weight was 146 pounds.   Echo (1/16) with EF 55-60%, severely dilated RV with moderately decreased systolic function, large pericardial effusion.   She was admitted in 2/16 with acute on chronic diastolic CHF/RV failure and hypotension.  Echo (2/16) showed EF 60%,  moderate-severe RV dilation with moderate-severe RV systolic dysfunction, PA systolic pressure 62 mmHg, large pericardial effusion with borderline tamponade.  She had pericardiocentesis with improvement in symptoms.  No malignant cells in pericardial fluid.  Repeat echo showed no pericardial effusion.  She developed paroxysmal atrial fibrillation on 2/19 but went back into NSR on amiodarone.  She was not anticoagulated given history of GI bleeding and pericardial effusion.   She is stable today.  She has weaned off tyvaso and currently on selexipag 1600 bid. Overall feels better.  No dyspnea walking in house and able to walk into office without stopping.  No PND.  Sleeps on several pillows chronically.  No lightheadedness.  No chest pain.   6 min walk (05/16/14): 125 m 6 min walk (6/16): 146 m 6 min walk (8/16): 183 m  Echo (1/16) with EF 55-60%, severely dilated RA with moderately decreased systolic function, large pericardial effusion.  ECHO 05/09/2014: EF 60-65% Peak PA pressure 52  ECHO 06/05/14: EF 55-60%, RV moderately dilated with moderately decreased systolic function with D-shaped interventricular septum. Severe RAE. RVSP 59. No pericardial effusion. Echo (8/16): EF 60-65%, moderate-severe RV dilation with mildly decreased RV systolic function, D-shaped interventricular septum, PASP 92, severe biatrial enlargement, no pericardial effusion.   RHC (1/16):  Hemodynamics (mmHg) RA mean 12 RV 64/14 PA 68/29, mean 42 PCWP mean 12 LV 102/14 AO 105/53 PA 67% AO 93% Cardiac Output (Fick) 6.63  Cardiac Index (Fick) 3.88  PVR 4.5 WU  Labs 03/24/2014 K 3.8 Creatinine 1.51 Hgb 9.3  Labs 2/16 BNP 121, K  4.4, creatinine 1.27, hgb 9.2 Labs 2/16 K 3.8, creatinine 1.58, AST 45, ALT 12 Labs 05/22/2014: Creatinine 2.1 K 3.7  Labs 06/05/2014: Creatinine 1.5 K 3.7, LFTs normal Labs 6/16: K 4.4, creatinine 1.82, hgb 8.8  ROS: All systems negative except as listed in HPI, PMH and Problem List.  SH:   Social History   Social History  . Marital Status: Legally Separated    Spouse Name: N/A  . Number of Children: 2  . Years of Education: 11   Occupational History  . Umemployed   . Disability     Social History Main Topics  . Smoking status: Never Smoker   . Smokeless tobacco: Never Used  . Alcohol Use: No  . Drug Use: No  . Sexual Activity: Not on file   Other Topics Concern  . Not on file   Social History Narrative    FAMILY HISTORY:  Significant for coronary artery disease and diabetes   Patient lives at home with granddaughter.    Patient has 2 children.    Patient has 11 years of education.    Patient is on disability.    Patient is right handed.    Patient is separated.      FH:  Family History  Problem Relation Age of Onset  . Heart disease Mother   . Diabetes Mother   . Diabetes Sister     Past Medical History  Diagnosis Date  . Secondary pulmonary hypertension     right heart cath 04/20/04  . Cough   . Allergic rhinitis, cause unspecified   . Esophageal reflux   . Systemic sclerosis   . Unspecified essential hypertension   . Gastritis   . Sickle cell trait     "trace"  . Obesity   . Visual changes   . Scleroderma   . Diastolic dysfunction   . Trichomonas   . Vaginal bleeding   . Neuropathy   . CHF (congestive heart failure)   . Type II diabetes mellitus     DIET CONTROL   . Bursitis   . History of blood transfusion     "think it was related to when I had partial hysteretomy; might have been for one of my knee ORs"  . Degeneration of lumbar or lumbosacral intervertebral disc   . Arthritis     "all over my body" (08/04/2013)  . Chronic kidney disease     "been in hospital for it; don't know what kind" (08/04/2013)    Current Outpatient Prescriptions  Medication Sig Dispense Refill  . ADCIRCA 20 MG TABS TAKE 2 TABLETS (40MG) BY MOUTH ONCE DAILY WITH OR WITHOUT FOOD. CALL [phone] FOR REFILLS. 60 tablet 11  . ambrisentan (LETAIRIS) 10  MG tablet Take 1 tablet (10 mg total) by mouth daily. 30 tablet 11  . amiodarone (PACERONE) 200 MG tablet Take 1 tablet (200 mg total) by mouth daily. 30 tablet 6  . esomeprazole (NEXIUM) 40 MG capsule Take 40 mg by mouth 2 (two) times daily.     . fenofibrate 54 MG tablet Take 1 tablet (54 mg total) by mouth daily. 30 tablet 12  . ferrous sulfate ([employer]S IRON) 325 (65 FE) MG tablet Take 325 mg by mouth daily with breakfast.    . FLUoxetine (PROZAC) 20 MG capsule take 1 capsule by mouth every morning 30 capsule 2  . furosemide (LASIX) 80 MG tablet Take 1 tablet (80 mg total) by mouth 2 (two) times daily. 90 tablet 3  .  HYDROcodone-acetaminophen (NORCO) 10-325 MG per tablet Take 1 tablet by mouth every 6 (six) hours as needed for moderate pain.     . hydrOXYzine (ATARAX/VISTARIL) 10 MG tablet Take 1 tablet (10 mg total) by mouth 2 (two) times daily as needed for itching. 20 tablet 0  . nortriptyline (PAMELOR) 10 MG capsule 2 tabs po qhs 60 capsule 11  . pregabalin (LYRICA) 150 MG capsule Take 1 capsule (150 mg total) by mouth 3 (three) times daily. 90 capsule 5  . promethazine (PHENERGAN) 12.5 MG tablet Take 1 tablet (12.5 mg total) by mouth every 6 (six) hours as needed for nausea or vomiting. 45 tablet 1  . Selexipag (UPTRAVI) 1600 MCG TABS Take 1,600 mcg by mouth 2 (two) times daily. 60 tablet 3   No current facility-administered medications for this encounter.    Filed Vitals:   11/05/14 1107  BP: 90/48  Pulse: 68  Weight: 148 lb 6.4 oz (67.314 kg)  SpO2: 96%    PHYSICAL EXAM:  General: NAD. Sitting chair Neck: JVP 7 cm, no thyromegaly or thyroid nodule.  Lungs: Decreased in the bases otherwise clean.   CV: RV heave. Heart regular S1/S2 with widely split S2,  2/6 HSM LLSB. No edema.  Abdomen: Soft, nontender, no hepatosplenomegaly. Minimally distended.   Neurologic: Alert and oriented x 3.  Psych: Normal affect. Extremities: No clubbing or cyanosis.   ASSESSMENT & PLAN: 1.  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  Echo done today and reviewed. EF 60-65% with moderate-severely dilated and mildly dysfunctional RV. Pericardial effusion has resolved.  NYHA class III symptoms, somewhat improved.  She does not appear markedly volume overloaded. - Continue Lasix 80 mg bid.  BMET/BNP today.  2. PAH with cor pulmonale: Severe PAH.  Had recent [place] in 1/16. Pulmonary pressure remains elevated by echo today. 6 minute walk today is improved.  - Continue ambrisentan 10 mg daily.  - Now on selexipag 1600 mcg bid.  - Continue Adcirca 40 mg daily.  3. CKD III: BMET today.   4. 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  She is no longer anticoagulated.  5. Pericardial effusion: Resolved after pericardiocentesis, not seen on today's echo. 6. Atrial fibrillation: Regular rhythm. Paroxysmal.  Continue amiodarone. She is not anticoagulated due to h/o GI bleeding.  LFTs and TSH ok 05/22/2014, repeat at next visit. She will need a yearly eye exam while on amiodarone.  Follow up in 2 months.  [name]  11/06/2014

## 2014-11-06 NOTE — Telephone Encounter (Signed)
Iniated PA for [employer] through [employer]. REF# [id number] Faxed to [phone] Will await response.

## 2014-11-07 DIAGNOSIS — E1140 Type 2 diabetes mellitus with diabetic neuropathy, unspecified: Secondary | ICD-10-CM | POA: Diagnosis not present

## 2014-11-07 DIAGNOSIS — I509 Heart failure, unspecified: Secondary | ICD-10-CM | POA: Diagnosis not present

## 2014-11-07 DIAGNOSIS — I270 Primary pulmonary hypertension: Secondary | ICD-10-CM | POA: Diagnosis not present

## 2014-11-07 DIAGNOSIS — L89312 Pressure ulcer of right buttock, stage 2: Secondary | ICD-10-CM | POA: Diagnosis not present

## 2014-11-07 DIAGNOSIS — J9610 Chronic respiratory failure, unspecified whether with hypoxia or hypercapnia: Secondary | ICD-10-CM | POA: Diagnosis not present

## 2014-11-07 DIAGNOSIS — I10 Essential (primary) hypertension: Secondary | ICD-10-CM | POA: Diagnosis not present

## 2014-11-07 DIAGNOSIS — L89322 Pressure ulcer of left buttock, stage 2: Secondary | ICD-10-CM | POA: Diagnosis not present

## 2014-11-07 NOTE — Telephone Encounter (Signed)
PA for [name] has been approved until 11/06/2015 under Medicare Part D. Ref # [id number]. Nothing further needed.

## 2014-11-12 DIAGNOSIS — M79606 Pain in leg, unspecified: Secondary | ICD-10-CM | POA: Diagnosis not present

## 2014-11-12 DIAGNOSIS — M150 Primary generalized (osteo)arthritis: Secondary | ICD-10-CM | POA: Diagnosis not present

## 2014-11-12 DIAGNOSIS — K219 Gastro-esophageal reflux disease without esophagitis: Secondary | ICD-10-CM | POA: Diagnosis not present

## 2014-11-12 DIAGNOSIS — I272 Other secondary pulmonary hypertension: Secondary | ICD-10-CM | POA: Diagnosis not present

## 2014-11-12 DIAGNOSIS — M340 Progressive systemic sclerosis: Secondary | ICD-10-CM | POA: Diagnosis not present

## 2014-11-13 ENCOUNTER — Other Ambulatory Visit: Payer: Self-pay | Admitting: Internal Medicine

## 2014-11-14 DIAGNOSIS — I10 Essential (primary) hypertension: Secondary | ICD-10-CM | POA: Diagnosis not present

## 2014-11-14 DIAGNOSIS — L89322 Pressure ulcer of left buttock, stage 2: Secondary | ICD-10-CM | POA: Diagnosis not present

## 2014-11-14 DIAGNOSIS — L89312 Pressure ulcer of right buttock, stage 2: Secondary | ICD-10-CM | POA: Diagnosis not present

## 2014-11-14 DIAGNOSIS — J9610 Chronic respiratory failure, unspecified whether with hypoxia or hypercapnia: Secondary | ICD-10-CM | POA: Diagnosis not present

## 2014-11-14 DIAGNOSIS — I270 Primary pulmonary hypertension: Secondary | ICD-10-CM | POA: Diagnosis not present

## 2014-11-14 DIAGNOSIS — E1140 Type 2 diabetes mellitus with diabetic neuropathy, unspecified: Secondary | ICD-10-CM | POA: Diagnosis not present

## 2014-11-14 DIAGNOSIS — I509 Heart failure, unspecified: Secondary | ICD-10-CM | POA: Diagnosis not present

## 2014-11-25 ENCOUNTER — Other Ambulatory Visit: Payer: Self-pay | Admitting: *Deleted

## 2014-11-25 ENCOUNTER — Telehealth: Payer: Self-pay | Admitting: Emergency Medicine

## 2014-11-25 MED ORDER — AMBRISENTAN 10 MG PO TABS: 10.0000 mg | ORAL_TABLET | Freq: Every day | ORAL | Status: DC

## 2014-11-25 NOTE — Telephone Encounter (Signed)
Medication was refilled this afternoon by RB's nurse Tried to call # back from [place] # has been disconnected   Nothing further is needed

## 2014-12-04 ENCOUNTER — Ambulatory Visit: Payer: Medicare Other | Admitting: Internal Medicine

## 2014-12-06 ENCOUNTER — Ambulatory Visit: Payer: Medicare Other | Admitting: Emergency Medicine

## 2014-12-17 ENCOUNTER — Other Ambulatory Visit (HOSPITAL_COMMUNITY): Payer: Self-pay | Admitting: *Deleted

## 2014-12-17 ENCOUNTER — Telehealth (HOSPITAL_COMMUNITY): Payer: Self-pay | Admitting: Vascular Surgery

## 2014-12-17 DIAGNOSIS — I5033 Acute on chronic diastolic (congestive) heart failure: Secondary | ICD-10-CM

## 2014-12-17 MED ORDER — FUROSEMIDE 80 MG PO TABS: 80.0000 mg | ORAL_TABLET | Freq: Two times a day (BID) | ORAL | Status: DC

## 2014-12-17 MED ORDER — AMIODARONE HCL 200 MG PO TABS: 200.0000 mg | ORAL_TABLET | Freq: Every day | ORAL | Status: DC

## 2014-12-17 NOTE — Telephone Encounter (Signed)
Pt left message she needs a refill on Amiodarone, Furosemide

## 2014-12-20 ENCOUNTER — Telehealth (HOSPITAL_COMMUNITY): Payer: Self-pay

## 2014-12-20 MED ORDER — METOLAZONE 2.5 MG PO TABS: ORAL_TABLET | ORAL | Status: DC

## 2014-12-20 NOTE — Telephone Encounter (Signed)
Called patient and instructed her to pick up Metolazone and take one 2.5 mg tablet daily for two days  Come in Monday at 10:20am for MD appointment

## 2014-12-20 NOTE — Telephone Encounter (Signed)
Returned patient call  C/o swelling all over  Both legs, arms.  Gradual onset over past two weeks.  Little more short of breath with activity  Does not monitor weight or BP  Is taking her Lasix 80 mg twice a day  Will  Ask for advice with Dr. Haroldine Laws in clinic today

## 2014-12-23 ENCOUNTER — Encounter (HOSPITAL_COMMUNITY): Payer: Self-pay

## 2014-12-23 ENCOUNTER — Inpatient Hospital Stay (HOSPITAL_COMMUNITY): Payer: Medicare Other

## 2014-12-23 ENCOUNTER — Inpatient Hospital Stay (HOSPITAL_COMMUNITY)
Admission: AD | Admit: 2014-12-23 | Discharge: 2014-12-28 | DRG: 291 | Disposition: A | Discharge status: Home Health | Payer: Medicare Other | Source: Ambulatory Visit | Attending: Cardiology | Admitting: Cardiology

## 2014-12-23 ENCOUNTER — Ambulatory Visit (HOSPITAL_COMMUNITY)
Admission: RE | Admit: 2014-12-23 | Discharge: 2014-12-23 | Disposition: A | Discharge status: Home / Self Care | Payer: Medicare Other | Source: Ambulatory Visit | Attending: Cardiology | Admitting: Cardiology

## 2014-12-23 ENCOUNTER — Encounter (HOSPITAL_COMMUNITY): Payer: Self-pay | Admitting: Physician Assistant

## 2014-12-23 ENCOUNTER — Other Ambulatory Visit (HOSPITAL_COMMUNITY): Payer: Self-pay | Admitting: Student

## 2014-12-23 VITALS — BP 100/54 | HR 60 | Wt 168.5 lb

## 2014-12-23 DIAGNOSIS — N189 Chronic kidney disease, unspecified: Secondary | ICD-10-CM | POA: Diagnosis present

## 2014-12-23 DIAGNOSIS — Z833 Family history of diabetes mellitus: Secondary | ICD-10-CM | POA: Diagnosis not present

## 2014-12-23 DIAGNOSIS — M349 Systemic sclerosis, unspecified: Secondary | ICD-10-CM | POA: Diagnosis not present

## 2014-12-23 DIAGNOSIS — K31811 Angiodysplasia of stomach and duodenum with bleeding: Secondary | ICD-10-CM | POA: Diagnosis present

## 2014-12-23 DIAGNOSIS — I509 Heart failure, unspecified: Secondary | ICD-10-CM

## 2014-12-23 DIAGNOSIS — I130 Hypertensive heart and chronic kidney disease with heart failure and stage 1 through stage 4 chronic kidney disease, or unspecified chronic kidney disease: Principal | ICD-10-CM | POA: Diagnosis present

## 2014-12-23 DIAGNOSIS — I4510 Unspecified right bundle-branch block: Secondary | ICD-10-CM | POA: Diagnosis present

## 2014-12-23 DIAGNOSIS — I480 Paroxysmal atrial fibrillation: Secondary | ICD-10-CM | POA: Diagnosis present

## 2014-12-23 DIAGNOSIS — Z9119 Patient's noncompliance with other medical treatment and regimen: Secondary | ICD-10-CM

## 2014-12-23 DIAGNOSIS — I2781 Cor pulmonale (chronic): Secondary | ICD-10-CM | POA: Diagnosis not present

## 2014-12-23 DIAGNOSIS — K219 Gastro-esophageal reflux disease without esophagitis: Secondary | ICD-10-CM | POA: Diagnosis present

## 2014-12-23 DIAGNOSIS — M109 Gout, unspecified: Secondary | ICD-10-CM | POA: Diagnosis present

## 2014-12-23 DIAGNOSIS — I50810 Right heart failure, unspecified: Secondary | ICD-10-CM

## 2014-12-23 DIAGNOSIS — E669 Obesity, unspecified: Secondary | ICD-10-CM | POA: Diagnosis not present

## 2014-12-23 DIAGNOSIS — K766 Portal hypertension: Secondary | ICD-10-CM | POA: Diagnosis not present

## 2014-12-23 DIAGNOSIS — E1122 Type 2 diabetes mellitus with diabetic chronic kidney disease: Secondary | ICD-10-CM | POA: Diagnosis present

## 2014-12-23 DIAGNOSIS — M79606 Pain in leg, unspecified: Secondary | ICD-10-CM | POA: Diagnosis not present

## 2014-12-23 DIAGNOSIS — R140 Abdominal distension (gaseous): Secondary | ICD-10-CM | POA: Diagnosis not present

## 2014-12-23 DIAGNOSIS — I272 Other secondary pulmonary hypertension: Secondary | ICD-10-CM | POA: Diagnosis present

## 2014-12-23 DIAGNOSIS — I82409 Acute embolism and thrombosis of unspecified deep veins of unspecified lower extremity: Secondary | ICD-10-CM | POA: Diagnosis not present

## 2014-12-23 DIAGNOSIS — R0602 Shortness of breath: Secondary | ICD-10-CM | POA: Diagnosis present

## 2014-12-23 DIAGNOSIS — Z9114 Patient's other noncompliance with medication regimen: Secondary | ICD-10-CM | POA: Diagnosis not present

## 2014-12-23 DIAGNOSIS — N183 Chronic kidney disease, stage 3 unspecified: Secondary | ICD-10-CM | POA: Diagnosis present

## 2014-12-23 DIAGNOSIS — I2721 Secondary pulmonary arterial hypertension: Secondary | ICD-10-CM

## 2014-12-23 DIAGNOSIS — Z6824 Body mass index (BMI) 24.0-24.9, adult: Secondary | ICD-10-CM | POA: Diagnosis not present

## 2014-12-23 DIAGNOSIS — Z96652 Presence of left artificial knee joint: Secondary | ICD-10-CM | POA: Diagnosis present

## 2014-12-23 DIAGNOSIS — Z8249 Family history of ischemic heart disease and other diseases of the circulatory system: Secondary | ICD-10-CM | POA: Diagnosis not present

## 2014-12-23 DIAGNOSIS — D573 Sickle-cell trait: Secondary | ICD-10-CM | POA: Diagnosis not present

## 2014-12-23 DIAGNOSIS — K3184 Gastroparesis: Secondary | ICD-10-CM | POA: Diagnosis present

## 2014-12-23 DIAGNOSIS — R531 Weakness: Secondary | ICD-10-CM | POA: Diagnosis not present

## 2014-12-23 DIAGNOSIS — Z90710 Acquired absence of both cervix and uterus: Secondary | ICD-10-CM | POA: Diagnosis not present

## 2014-12-23 DIAGNOSIS — I5033 Acute on chronic diastolic (congestive) heart failure: Secondary | ICD-10-CM | POA: Diagnosis present

## 2014-12-23 DIAGNOSIS — E1143 Type 2 diabetes mellitus with diabetic autonomic (poly)neuropathy: Secondary | ICD-10-CM | POA: Diagnosis present

## 2014-12-23 DIAGNOSIS — M1990 Unspecified osteoarthritis, unspecified site: Secondary | ICD-10-CM | POA: Diagnosis present

## 2014-12-23 DIAGNOSIS — I5032 Chronic diastolic (congestive) heart failure: Secondary | ICD-10-CM | POA: Diagnosis present

## 2014-12-23 DIAGNOSIS — D500 Iron deficiency anemia secondary to blood loss (chronic): Secondary | ICD-10-CM | POA: Diagnosis not present

## 2014-12-23 DIAGNOSIS — Z9181 History of falling: Secondary | ICD-10-CM | POA: Diagnosis not present

## 2014-12-23 DIAGNOSIS — E039 Hypothyroidism, unspecified: Secondary | ICD-10-CM | POA: Diagnosis present

## 2014-12-23 DIAGNOSIS — Q2733 Arteriovenous malformation of digestive system vessel: Secondary | ICD-10-CM | POA: Diagnosis not present

## 2014-12-23 DIAGNOSIS — K5520 Angiodysplasia of colon without hemorrhage: Secondary | ICD-10-CM

## 2014-12-23 DIAGNOSIS — E785 Hyperlipidemia, unspecified: Secondary | ICD-10-CM | POA: Diagnosis not present

## 2014-12-23 DIAGNOSIS — D649 Anemia, unspecified: Secondary | ICD-10-CM | POA: Diagnosis present

## 2014-12-23 DIAGNOSIS — D509 Iron deficiency anemia, unspecified: Secondary | ICD-10-CM | POA: Diagnosis present

## 2014-12-23 DIAGNOSIS — Z79899 Other long term (current) drug therapy: Secondary | ICD-10-CM

## 2014-12-23 HISTORY — DX: Paroxysmal atrial fibrillation: I48.0

## 2014-12-23 HISTORY — DX: Gout, unspecified: M10.9

## 2014-12-23 HISTORY — DX: Hypothyroidism, unspecified: E03.9

## 2014-12-23 HISTORY — DX: Gastroparesis: K31.84

## 2014-12-23 HISTORY — DX: Chronic diastolic (congestive) heart failure: I50.32

## 2014-12-23 HISTORY — DX: Polyneuropathy, unspecified: G62.9

## 2014-12-23 HISTORY — DX: Anemia, unspecified: D64.9

## 2014-12-23 HISTORY — DX: Gastro-esophageal reflux disease without esophagitis: K21.9

## 2014-12-23 LAB — IRON AND TIBC
IRON: 13 ug/dL — AB (ref 28–170)
Saturation Ratios: 3 % — ABNORMAL LOW (ref 10.4–31.8)
TIBC: 393 ug/dL (ref 250–450)
UIBC: 380 ug/dL

## 2014-12-23 LAB — COMPREHENSIVE METABOLIC PANEL
ALK PHOS: 98 U/L (ref 38–126)
ALT: 8 U/L — AB (ref 14–54)
ANION GAP: 12 (ref 5–15)
AST: 23 U/L (ref 15–41)
Albumin: 2.6 g/dL — ABNORMAL LOW (ref 3.5–5.0)
BUN: 29 mg/dL — ABNORMAL HIGH (ref 6–20)
CALCIUM: 8.4 mg/dL — AB (ref 8.9–10.3)
CHLORIDE: 100 mmol/L — AB (ref 101–111)
CO2: 25 mmol/L (ref 22–32)
CREATININE: 1.75 mg/dL — AB (ref 0.44–1.00)
GFR, EST AFRICAN AMERICAN: 35 mL/min — AB (ref >60)
GFR, EST NON AFRICAN AMERICAN: 30 mL/min — AB (ref >60)
Glucose, Bld: 95 mg/dL (ref 65–99)
Potassium: 3.3 mmol/L — ABNORMAL LOW (ref 3.5–5.1)
SODIUM: 137 mmol/L (ref 135–145)
Total Bilirubin: 0.7 mg/dL (ref 0.3–1.2)
Total Protein: 7.4 g/dL (ref 6.5–8.1)

## 2014-12-23 LAB — CBC
HCT: 19.7 % — ABNORMAL LOW (ref 36.0–46.0)
Hemoglobin: 5.8 g/dL — CL (ref 12.0–15.0)
MCH: 18.4 pg — ABNORMAL LOW (ref 26.0–34.0)
MCHC: 29.4 g/dL — ABNORMAL LOW (ref 30.0–36.0)
MCV: 62.5 fL — ABNORMAL LOW (ref 78.0–100.0)
PLATELETS: 281 10*3/uL (ref 150–400)
RBC: 3.15 MIL/uL — AB (ref 3.87–5.11)
RDW: 20.2 % — AB (ref 11.5–15.5)
WBC: 5.5 10*3/uL (ref 4.0–10.5)

## 2014-12-23 LAB — PREPARE RBC (CROSSMATCH)

## 2014-12-23 LAB — TSH: TSH: 8.788 u[IU]/mL — ABNORMAL HIGH (ref 0.350–4.500)

## 2014-12-23 LAB — RETICULOCYTES
RBC.: 2.99 MIL/uL — ABNORMAL LOW (ref 3.87–5.11)
RETIC COUNT ABSOLUTE: 62.8 10*3/uL (ref 19.0–186.0)
Retic Ct Pct: 2.1 % (ref 0.4–3.1)

## 2014-12-23 LAB — FOLATE: Folate: 22.4 ng/mL (ref >5.9)

## 2014-12-23 LAB — FERRITIN: FERRITIN: 10 ng/mL — AB (ref 11–307)

## 2014-12-23 LAB — BRAIN NATRIURETIC PEPTIDE: B NATRIURETIC PEPTIDE 5: 570.5 pg/mL — AB (ref 0.0–100.0)

## 2014-12-23 LAB — VITAMIN B12: Vitamin B-12: 472 pg/mL (ref 180–914)

## 2014-12-23 MED ORDER — FENOFIBRATE 54 MG PO TABS
54.0000 mg | ORAL_TABLET | Freq: Every day | ORAL | Status: DC
  Administered 2014-12-23 – 2014-12-28 (×6): 54 mg via ORAL
  Filled 2014-12-23 (×7): qty 1

## 2014-12-23 MED ORDER — AMIODARONE HCL 200 MG PO TABS
200.0000 mg | ORAL_TABLET | Freq: Every day | ORAL | Status: DC
  Administered 2014-12-23 – 2014-12-28 (×6): 200 mg via ORAL
  Filled 2014-12-23 (×6): qty 1

## 2014-12-23 MED ORDER — FERROUS SULFATE 325 (65 FE) MG PO TABS
325.0000 mg | ORAL_TABLET | Freq: Every day | ORAL | Status: DC
  Administered 2014-12-25 – 2014-12-28 (×4): 325 mg via ORAL
  Filled 2014-12-23 (×4): qty 1

## 2014-12-23 MED ORDER — ONDANSETRON HCL 4 MG/2ML IJ SOLN: 4.0000 mg | Freq: Four times a day (QID) | INTRAMUSCULAR | Status: DC | PRN

## 2014-12-23 MED ORDER — SODIUM CHLORIDE 0.9 % IV SOLN: 250.0000 mL | INTRAVENOUS | Status: DC | PRN

## 2014-12-23 MED ORDER — SODIUM CHLORIDE 0.9 % IJ SOLN: 3.0000 mL | INTRAMUSCULAR | Status: DC | PRN

## 2014-12-23 MED ORDER — POTASSIUM CHLORIDE CRYS ER 20 MEQ PO TBCR
20.0000 meq | EXTENDED_RELEASE_TABLET | Freq: Two times a day (BID) | ORAL | Status: DC
  Administered 2014-12-23 – 2014-12-26 (×8): 20 meq via ORAL
  Filled 2014-12-23 (×8): qty 1

## 2014-12-23 MED ORDER — POTASSIUM CHLORIDE CRYS ER 20 MEQ PO TBCR: 40.0000 meq | EXTENDED_RELEASE_TABLET | Freq: Once | ORAL | Status: DC

## 2014-12-23 MED ORDER — SELEXIPAG 1600 MCG PO TABS
1600.0000 ug | ORAL_TABLET | Freq: Two times a day (BID) | ORAL | Status: DC
  Administered 2014-12-23 – 2014-12-28 (×10): 1600 ug via ORAL
  Filled 2014-12-23 (×11): qty 1

## 2014-12-23 MED ORDER — FUROSEMIDE 10 MG/ML IJ SOLN
80.0000 mg | Freq: Three times a day (TID) | INTRAMUSCULAR | Status: DC
  Administered 2014-12-23 – 2014-12-26 (×9): 80 mg via INTRAVENOUS
  Filled 2014-12-23 (×9): qty 8

## 2014-12-23 MED ORDER — PREGABALIN 75 MG PO CAPS
150.0000 mg | ORAL_CAPSULE | Freq: Two times a day (BID) | ORAL | Status: DC
  Administered 2014-12-23 – 2014-12-28 (×10): 150 mg via ORAL
  Filled 2014-12-23 (×10): qty 2

## 2014-12-23 MED ORDER — FLUOXETINE HCL 20 MG PO CAPS
20.0000 mg | ORAL_CAPSULE | Freq: Every morning | ORAL | Status: DC
  Administered 2014-12-23 – 2014-12-28 (×6): 20 mg via ORAL
  Filled 2014-12-23 (×6): qty 1

## 2014-12-23 MED ORDER — TADALAFIL (PAH) 20 MG PO TABS
40.0000 mg | ORAL_TABLET | Freq: Every day | ORAL | Status: DC
  Administered 2014-12-23 – 2014-12-28 (×6): 40 mg via ORAL
  Filled 2014-12-23 (×7): qty 2

## 2014-12-23 MED ORDER — NORTRIPTYLINE HCL 10 MG PO CAPS
20.0000 mg | ORAL_CAPSULE | Freq: Every day | ORAL | Status: DC
  Administered 2014-12-23 – 2014-12-27 (×5): 20 mg via ORAL
  Filled 2014-12-23 (×6): qty 2

## 2014-12-23 MED ORDER — ACETAMINOPHEN 325 MG PO TABS
650.0000 mg | ORAL_TABLET | ORAL | Status: DC | PRN
  Administered 2014-12-24 – 2014-12-26 (×2): 650 mg via ORAL
  Filled 2014-12-23 (×2): qty 2

## 2014-12-23 MED ORDER — PANTOPRAZOLE SODIUM 40 MG PO TBEC
40.0000 mg | DELAYED_RELEASE_TABLET | Freq: Every day | ORAL | Status: DC
  Administered 2014-12-23 – 2014-12-28 (×6): 40 mg via ORAL
  Filled 2014-12-23 (×7): qty 1

## 2014-12-23 MED ORDER — HEPARIN SODIUM (PORCINE) 5000 UNIT/ML IJ SOLN: 5000.0000 [IU] | Freq: Three times a day (TID) | INTRAMUSCULAR | Status: DC

## 2014-12-23 MED ORDER — AMBRISENTAN 10 MG PO TABS
10.0000 mg | ORAL_TABLET | Freq: Every day | ORAL | Status: DC
  Administered 2014-12-23 – 2014-12-27 (×5): 10 mg via ORAL
  Filled 2014-12-23 (×6): qty 1

## 2014-12-23 MED ORDER — PROMETHAZINE HCL 25 MG PO TABS
12.5000 mg | ORAL_TABLET | Freq: Four times a day (QID) | ORAL | Status: DC | PRN
Start: 2014-12-23 — End: 2014-12-28

## 2014-12-23 MED ORDER — HYDROCODONE-ACETAMINOPHEN 10-325 MG PO TABS
1.0000 | ORAL_TABLET | Freq: Four times a day (QID) | ORAL | Status: DC | PRN
  Administered 2014-12-23 – 2014-12-27 (×10): 1 via ORAL
  Filled 2014-12-23 (×10): qty 1

## 2014-12-23 MED ORDER — SODIUM CHLORIDE 0.9 % IV SOLN: Freq: Once | INTRAVENOUS | Status: DC

## 2014-12-23 MED ORDER — HYDROXYZINE HCL 10 MG PO TABS
10.0000 mg | ORAL_TABLET | Freq: Two times a day (BID) | ORAL | Status: DC | PRN
Start: 2014-12-23 — End: 2014-12-28

## 2014-12-23 MED ORDER — SODIUM CHLORIDE 0.9 % IJ SOLN
3.0000 mL | Freq: Two times a day (BID) | INTRAMUSCULAR | Status: DC
  Administered 2014-12-24 – 2014-12-28 (×9): 3 mL via INTRAVENOUS

## 2014-12-23 MED ORDER — POTASSIUM CHLORIDE CRYS ER 20 MEQ PO TBCR
40.0000 meq | EXTENDED_RELEASE_TABLET | Freq: Once | ORAL | Status: AC
  Administered 2014-12-23: 40 meq via ORAL
  Filled 2014-12-23: qty 2

## 2014-12-23 NOTE — Progress Notes (Signed)
Advanced Heart Failure Clinic Note   Patient ID: [name], female   DOB: [birth year], 64 y.o.   MRN: [id number] Pulmonary: Dr Lamonte Sakai  HF: Arayla Kruschke  HPI: [name] is a 64 yo female with a history of DM2, HLD, HTN, pHTN, GERD, gastroparesis, scleroderma, DJD and diastolic HF.   Of note she has been on several PAH targeted therapies and coumadin over last 9 years, but has not been reliably on therapy due to noncompliance and inability to have labs performed. She has had best response to Tyvaso (+ bosentan) with an improvement in 6 minute walk and in PASP from 102 mmHg (7/09) to 42 mmHg (9/10). After stopping Tyvaso her PASP rose to 80's by TTE 6/13 and 11/14. Surprisingly, she has never required supplemental O2. Macitentan was started in 11/14. She developed nausea and diarrhea and macitentan was stopped early 3/15. Most recently, she had been on Letairis and Adcirca (followed by Dr. Lamonte Sakai).   Admitted 10/14-10/18/15 for A/C RHF, volume overload and syncope. Diuresed with IV lasix and milrinone a total of 26 lbs. Discharge weight was 156 lbs and started on lasix 40 mg BID.   Admitted 12/29 with dyspnea and chest pain. BP was low, and Lasix and Adcirca were both initially held. Echo showed EF 60-65% with moderately dilated and dysfunctional RV, PASP 111 mmHg with moderate TR. There was a large pericardial effusion without definite tamponade. Presentation was suspected to be due to progressive PAH with secondary RV failure rather than tamponade. No pericardiocentesis yet. She was started on milrinone gtt and diuresed. Shorter-acting sildenafil was started instead of Adcirca. Also had GI bleed. 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 Discharge weight was 146 pounds.   Echo (1/16) with EF 55-60%, severely dilated RV with moderately decreased systolic function, large pericardial effusion.   She was admitted in 2/16 with acute on chronic diastolic CHF/RV  failure and hypotension.  Echo (2/16) showed EF 60%, moderate-severe RV dilation with moderate-severe RV systolic dysfunction, PA systolic pressure 62 mmHg, large pericardial effusion with borderline tamponade.  She had pericardiocentesis with improvement in symptoms.  No malignant cells in pericardial fluid.  Repeat echo showed no pericardial effusion.  She developed paroxysmal atrial fibrillation on 2/19 but went back into NSR on amiodarone.  She was not anticoagulated given history of GI bleeding and pericardial effusion.   She presents today for add on.  Weight up 20 lbs since last visit, has been feeling bad with increased swelling and weight gain the past 2-3 weeks. She has been off her medicines for 1-2 weeks and only restarted last Tuesday. States [facility] said she didn't have refills.[name] metolazone 2.5 mg over the weekend with no improvement. Very tired, no energy. SOB with changing clothes, occasional SOB at rest. Sleeps on several pillows chronically.  No lightheadedness.  No chest pain. Has fallen most days this week, with weakness.  6 min walk (05/16/14): 125 m 6 min walk (6/16): 146 m 6 min walk (8/16): 183 m  Echo (1/16) with EF 55-60%, severely dilated RA with moderately decreased systolic function, large pericardial effusion.  ECHO 05/09/2014: EF 60-65% Peak PA pressure 52  ECHO 06/05/14: EF 55-60%, RV moderately dilated with moderately decreased systolic function with D-shaped interventricular septum. Severe RAE. RVSP 59. No pericardial effusion. Echo (8/16): EF 60-65%, moderate-severe RV dilation with mildly decreased RV systolic function, D-shaped interventricular septum, PASP 92, severe biatrial enlargement, no pericardial effusion.   RHC (  1/16):  Hemodynamics (mmHg) RA mean 12 RV 64/14 PA 68/29, mean 42 PCWP mean 12 LV 102/14 AO 105/53 PA 67% AO 93% Cardiac Output (Fick) 6.63  Cardiac Index (Fick) 3.88  PVR 4.5 WU  Labs 03/24/2014 K 3.8 Creatinine 1.51 Hgb 9.3  Labs  2/16 BNP 121, K 4.4, creatinine 1.27, hgb 9.2 Labs 2/16 K 3.8, creatinine 1.58, AST 45, ALT 12 Labs 05/22/2014: Creatinine 2.1 K 3.7  Labs 06/05/2014: Creatinine 1.5 K 3.7, LFTs normal Labs 6/16: K 4.4, creatinine 1.82, hgb 8.8  ROS: All systems negative except as listed in HPI, PMH and Problem List.  SH:  Social History   Social History  . Marital Status: Legally Separated    Spouse Name: N/A  . Number of Children: 2  . Years of Education: 11   Occupational History  . Umemployed   . Disability     Social History Main Topics  . Smoking status: Never Smoker   . Smokeless tobacco: Never Used  . Alcohol Use: No  . Drug Use: No  . Sexual Activity: Not on file   Other Topics Concern  . Not on file   Social History Narrative    FAMILY HISTORY:  Significant for coronary artery disease and diabetes   Patient lives at home with granddaughter.    Patient has 2 children.    Patient has 11 years of education.    Patient is on disability.    Patient is right handed.    Patient is separated.      FH:  Family History  Problem Relation Age of Onset  . Heart disease Mother   . Diabetes Mother   . Diabetes Sister     Past Medical History  Diagnosis Date  . Secondary pulmonary hypertension ([place])     right heart cath 04/20/04  . Cough   . Allergic rhinitis, cause unspecified   . Esophageal reflux   . Systemic sclerosis ([place])   . Unspecified essential hypertension   . Gastritis   . Sickle cell trait ([place])     "trace"  . Obesity   . Visual changes   . Scleroderma ([place])   . Diastolic dysfunction   . Trichomonas   . Vaginal bleeding   . Neuropathy ([place])   . CHF (congestive heart failure) ([place])   . Type II diabetes mellitus ([facility])     DIET CONTROL   . Bursitis   . History of blood transfusion     "think it was related to when I had partial hysteretomy; might have been for one of my knee ORs"  . Degeneration of lumbar or lumbosacral intervertebral disc   . Arthritis      "all over my body" (08/04/2013)  . Chronic kidney disease     "been in hospital for it; don't know what kind" (08/04/2013)    Current Outpatient Prescriptions  Medication Sig Dispense Refill  . ADCIRCA 20 MG TABS TAKE 2 TABLETS (40MG) BY MOUTH ONCE DAILY WITH OR WITHOUT FOOD. CALL [phone] FOR REFILLS. 60 tablet 11  . ambrisentan (LETAIRIS) 10 MG tablet Take 1 tablet (10 mg total) by mouth daily. 30 tablet 11  . amiodarone (PACERONE) 200 MG tablet Take 1 tablet (200 mg total) by mouth daily. 30 tablet 6  . esomeprazole (NEXIUM) 40 MG capsule Take 40 mg by mouth 2 (two) times daily.     . fenofibrate 54 MG tablet Take 1 tablet (54 mg total) by mouth daily. 30 tablet 12  . ferrous  sulfate ([facility] IRON) 325 (65 FE) MG tablet Take 325 mg by mouth daily with breakfast.    . FLUoxetine (PROZAC) 20 MG capsule take 1 capsule by mouth every morning 30 capsule 2  . furosemide (LASIX) 80 MG tablet Take 1 tablet (80 mg total) by mouth 2 (two) times daily. 180 tablet 3  . HYDROcodone-acetaminophen (NORCO) 10-325 MG per tablet Take 1 tablet by mouth every 6 (six) hours as needed for moderate pain.     . hydrOXYzine (ATARAX/VISTARIL) 10 MG tablet Take 1 tablet (10 mg total) by mouth 2 (two) times daily as needed for itching. 20 tablet 0  . metolazone (ZAROXOLYN) 2.5 MG tablet Take one tablet daily for 2 days 2 tablet 0  . nortriptyline (PAMELOR) 10 MG capsule 2 tabs po qhs 60 capsule 11  . pregabalin (LYRICA) 150 MG capsule Take 1 capsule (150 mg total) by mouth 3 (three) times daily. 90 capsule 5  . promethazine (PHENERGAN) 12.5 MG tablet Take 1 tablet (12.5 mg total) by mouth every 6 (six) hours as needed for nausea or vomiting. 45 tablet 1  . Selexipag (UPTRAVI) 1600 MCG TABS Take 1,600 mcg by mouth 2 (two) times daily. 60 tablet 3   No current facility-administered medications for this encounter.    Filed Vitals:   12/23/14 1109  BP: 100/54  Pulse: 60  Weight: 168 lb 8 oz (76.431 kg)  SpO2: 96%      PHYSICAL EXAM:  General: Elderly and fatigued appearing. Neck: JVP to ear, no thyromegaly or thyroid nodule.  Lungs: diminished, bilateral crackles in bases. CV: RV heave. Heart regular S1/S2 with widely split S2,  2/6 HSM LLSB. 2-3 edema into thighs. Abdomen: no hepatosplenomegaly. Distended, tight, mildly tender. Neurologic: Alert and oriented x 3.  Psych: Affect flat. Extremities: No clubbing or cyanosis.   ASSESSMENT & PLAN: 1. 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  Echo 11/05/14  EF 60-65% with moderate-severely dilated and mildly dysfunctional RV. Pericardial effusion has resolved.  NYHA class IIIb symptoms - Admit for IV diuresis with IV lasix 80 mg q 8 hrs. 2. PAH with cor pulmonale: Severe PAH.  Had recent [place] in 1/16. Pulmonary pressure remains elevated by echo today. 6 minute walk today is improved.  - Continue ambrisentan 10 mg daily.  - Continue selexipag 1600 mcg bid.  - Continue Adcirca 40 mg daily.  3. CKD III: Will follow closely with IV diuresis. 4. 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  She is no longer anticoagulated.  5. Pericardial effusion: Resolved after pericardiocentesis, not seen on echo 10/2014.  Repeat echo. 6. Atrial fibrillation: Regular rhythm. Paroxysmal.  Continue amiodarone. She is not anticoagulated due to h/o GI bleeding. LFTs/TSH today. She will need a yearly eye exam while on amiodarone.  Pt is markedly volume overloaded.  Will need to admit for IV diuresis. EKG, Repeat ECHO, and [place] with possible paracentesis. Will get CMET, CBC, TSH, and BNP.  Shirley Friar, PA-C 12/23/2014  Patient seen with PA, agree with the above note.  She is markedly volume overloaded.  She was out of her meds for about a week but has restarted them.  Weight up 20 lbs.  Will need admission for diuresis as above.  Will get echo to make sure she has not reaccumulated pericardial effusion and abdominal US to look for ascites.    [name] 12/23/2014 12:13 PM

## 2014-12-23 NOTE — Addendum Note (Signed)
Encounter addended by: Patton Salles, RN on: 12/23/2014  1:27 PM<BR>     Documentation filed: Notes Section

## 2014-12-23 NOTE — H&P (Signed)
Advanced Heart Failure Team History and Physical Note     Advanced Heart Failure Clinic Note   Patient ID: [name], female DOB: June 30, 1950, 64 y.o. MRN: [id number] Pulmonary: [name]  HF: [place]  HPI: [name] is a 64 yo female with a history of DM2, HLD, HTN, pHTN, GERD, gastroparesis, scleroderma, DJD and diastolic HF.   Of note she has been on several PAH targeted therapies and coumadin over last 9 years, but has not been reliably on therapy due to noncompliance and inability to have labs performed. She has had best response to Tyvaso (+ bosentan) with an improvement in 6 minute walk and in PASP from 102 mmHg (7/09) to 42 mmHg (9/10). After stopping Tyvaso her PASP rose to 80's by TTE 6/13 and 11/14. Surprisingly, she has never required supplemental O2. Macitentan was started in 11/14. She developed nausea and diarrhea and macitentan was stopped early 3/15. Most recently, she had been on Letairis and Adcirca (followed by Pamela. Lamonte [name]).   Admitted 10/14-10/18/15 for A/C RHF, volume overload and syncope. Diuresed with IV lasix and milrinone a total of 26 lbs. Discharge weight was 156 lbs and started on lasix 40 mg BID.   Admitted 12/29 with dyspnea and chest pain. BP was low, and Lasix and Adcirca were both initially held. Echo showed EF 60-65% with moderately dilated and dysfunctional RV, PASP 111 mmHg with moderate TR. There was a large pericardial effusion without definite tamponade. Presentation was suspected to be due to progressive PAH with secondary RV failure rather than tamponade. No pericardiocentesis yet. She was started on milrinone gtt and diuresed. Shorter-acting sildenafil was started instead of Adcirca. Also had GI bleed. 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 Discharge weight was 146 pounds.   Echo (1/16) with EF 55-60%, severely dilated RV with moderately decreased systolic function, large pericardial effusion.   She  was admitted in 2/16 with acute on chronic diastolic CHF/RV failure and hypotension. Echo (2/16) showed EF 60%, moderate-severe RV dilation with moderate-severe RV systolic dysfunction, PA systolic pressure 62 mmHg, large pericardial effusion with borderline tamponade. She had pericardiocentesis with improvement in symptoms. No malignant cells in pericardial fluid. Repeat echo showed no pericardial effusion. She developed paroxysmal atrial fibrillation on 2/19 but went back into NSR on amiodarone. She was not anticoagulated given history of GI bleeding and pericardial effusion.   She presents today for add on. Weight up 20 lbs since last visit, has been feeling bad with increased swelling and weight gain the past 2-3 weeks. She has been off her medicines for 1-2 weeks and only restarted last Tuesday. [facility] said she didn't have refills.[name] metolazone 2.5 mg over the weekend with no improvement. Very tired, no energy. SOB with changing clothes, occasional SOB at rest. Sleeps on several pillows chronically. No lightheadedness. No chest pain. Has fallen most days this week, with weakness.  6 min walk (05/16/14): 125 m 6 min walk (6/16): 146 m 6 min walk (8/16): 183 m  Echo (1/16) with EF 55-60%, severely dilated RA with moderately decreased systolic function, large pericardial effusion.  ECHO 05/09/2014: EF 60-65% Peak PA pressure 52  ECHO 06/05/14: EF 55-60%, RV moderately dilated with moderately decreased systolic function with D-shaped interventricular septum. Severe RAE. RVSP 59. No pericardial effusion. Echo (8/16): EF 60-65%, moderate-severe RV dilation with mildly decreased RV systolic function, D-shaped interventricular septum, PASP 92, severe biatrial enlargement, no pericardial effusion.   RHC (1/16):  Hemodynamics (mmHg) RA mean 12 RV 64/14 PA 68/29, mean 42 PCWP mean 12 LV 102/14 AO 105/53 PA 67% AO 93% Cardiac Output (Fick) 6.63  Cardiac Index (Fick) 3.88   PVR 4.5 WU  Labs 03/24/2014 K 3.8 Creatinine 1.51 Hgb 9.3  Labs 2/16 BNP 121, K 4.4, creatinine 1.27, hgb 9.2 Labs 2/16 K 3.8, creatinine 1.58, AST 45, ALT 12 Labs 05/22/2014: Creatinine 2.1 K 3.7  Labs 06/05/2014: Creatinine 1.5 K 3.7, LFTs normal Labs 6/16: K 4.4, creatinine 1.82, hgb 8.8  ROS: All systems negative except as listed in HPI, PMH and Problem List.  SH:  Social History   Social History  . Marital Status: Legally Separated    Spouse Name: N/A  . Number of Children: 2  . Years of Education: 11   Occupational History  . Umemployed   . Disability     Social History Main Topics  . Smoking status: Never Smoker   . Smokeless tobacco: Never Used  . Alcohol Use: No  . Drug Use: No  . Sexual Activity: Not on file   Other Topics Concern  . Not on file   Social History Narrative   FAMILY HISTORY: Significant for coronary artery disease and diabetes   Patient lives at home with granddaughter.    Patient has 2 children.    Patient has 11 years of education.    Patient is on disability.    Patient is right handed.    Patient is separated.     FH:  Family History  Problem Relation Age of Onset  . Heart disease Mother   . Diabetes Mother   . Diabetes Sister     Past Medical History  Diagnosis Date  . Secondary pulmonary hypertension ([place])     right heart cath 04/20/04  . Cough   . Allergic rhinitis, cause unspecified   . Esophageal reflux   . Systemic sclerosis ([place])   . Unspecified essential hypertension   . Gastritis   . Sickle cell trait ([place])     "trace"  . Obesity   . Visual changes   . Scleroderma ([place])   . Diastolic dysfunction   . Trichomonas   . Vaginal bleeding   . Neuropathy ([place])   . CHF (congestive heart failure) ([place])   . Type II diabetes mellitus ([facility])     DIET CONTROL   . Bursitis    . History of blood transfusion     "think it was related to when I had partial hysteretomy; might have been for one of my knee ORs"  . Degeneration of lumbar or lumbosacral intervertebral disc   . Arthritis     "all over my body" (08/04/2013)  . Chronic kidney disease     "been in hospital for it; don't know what kind" (08/04/2013)    Current Outpatient Prescriptions  Medication Sig Dispense Refill  . ADCIRCA 20 MG TABS TAKE 2 TABLETS (40MG) BY MOUTH ONCE DAILY WITH OR WITHOUT FOOD. CALL [phone] FOR REFILLS. 60 tablet 11  . ambrisentan (LETAIRIS) 10 MG tablet Take 1 tablet (10 mg total) by mouth daily. 30 tablet 11  . amiodarone (PACERONE) 200 MG tablet Take 1 tablet (200 mg total) by mouth daily. 30 tablet 6  . esomeprazole (NEXIUM) 40 MG capsule Take 40 mg by mouth 2 (two) times daily.     . fenofibrate 54 MG tablet Take 1 tablet (54 mg total) by mouth daily. 30 tablet 12  . ferrous sulfate ([employer] IRON) 325 (65  FE) MG tablet Take 325 mg by mouth daily with breakfast.    . FLUoxetine (PROZAC) 20 MG capsule take 1 capsule by mouth every morning 30 capsule 2  . furosemide (LASIX) 80 MG tablet Take 1 tablet (80 mg total) by mouth 2 (two) times daily. 180 tablet 3  . HYDROcodone-acetaminophen (NORCO) 10-325 MG per tablet Take 1 tablet by mouth every 6 (six) hours as needed for moderate pain.     . hydrOXYzine (ATARAX/VISTARIL) 10 MG tablet Take 1 tablet (10 mg total) by mouth 2 (two) times daily as needed for itching. 20 tablet 0  . metolazone (ZAROXOLYN) 2.5 MG tablet Take one tablet daily for 2 days 2 tablet 0  . nortriptyline (PAMELOR) 10 MG capsule 2 tabs po qhs 60 capsule 11  . pregabalin (LYRICA) 150 MG capsule Take 1 capsule (150 mg total) by mouth 3 (three) times daily. 90 capsule 5  . promethazine (PHENERGAN) 12.5 MG tablet Take 1 tablet (12.5 mg total) by mouth every 6 (six) hours as  needed for nausea or vomiting. 45 tablet 1  . Selexipag (UPTRAVI) 1600 MCG TABS Take 1,600 mcg by mouth 2 (two) times daily. 60 tablet 3   No current facility-administered medications for this encounter.    Filed Vitals:   12/23/14 1109  BP: 100/54  Pulse: 60  Weight: 168 lb 8 oz (76.431 kg)  SpO2: 96%    PHYSICAL EXAM:  General: Elderly and fatigued appearing. Neck: JVP to ear, no thyromegaly or thyroid nodule.  Lungs: diminished, bilateral crackles in bases. CV: RV heave. Heart regular S1/S2 with widely split S2, 2/6 HSM LLSB. 2-3 edema into thighs. Abdomen: no hepatosplenomegaly. Distended, tight, mildly tender. Neurologic: Alert and oriented x 3.  Psych: Affect flat. Extremities: No clubbing or cyanosis.   ASSESSMENT & PLAN: 1. 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 Echo 11/05/14 EF 60-65% with moderate-severely dilated and mildly dysfunctional RV. Pericardial effusion has resolved. NYHA class IIIb symptoms - Admit for IV diuresis with IV lasix 80 mg q 8 hrs. 2. PAH with cor pulmonale: Severe PAH. Had recent [place] in 1/16. Pulmonary pressure remains elevated by echo today. 6 minute walk today is improved.  - Continue ambrisentan 10 mg daily.  - Continue selexipag 1600 mcg bid.  - Continue Adcirca 40 mg daily.  3. CKD III: Will follow closely with IV diuresis. 4. 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 She is no longer anticoagulated.  5. Pericardial effusion: Resolved after pericardiocentesis, not seen on echo 10/2014. Repeat echo. 6. Atrial fibrillation: Regular rhythm. Paroxysmal. Continue amiodarone. She is not anticoagulated due to h/o GI bleeding. LFTs/TSH today. She will need a yearly eye exam while on amiodarone.  Pt is markedly volume overloaded. Will need to admit for IV diuresis. EKG, Repeat ECHO, and [place] with possible paracentesis. Will get CMET, CBC, TSH, and BNP.  Pamela Friar,  PA-C 12/23/2014  Patient seen with PA, agree with the above note. She is markedly volume overloaded. She was out of her meds for about a week but has restarted them. Weight up 20 lbs. Will need admission for diuresis as above. Will get echo to make sure she has not reaccumulated pericardial effusion and abdominal US to look for ascites.   [name] 12/23/2014 12:13 PM

## 2014-12-23 NOTE — Consult Note (Signed)
[facility] Consult: 3:03 PM 12/23/2014  LOS: 0 days    Referring Provider: Dr Aundra Dubin  Primary Care Physician:  Gilles Chiquito, MD Primary Gastroenterologist:  Dr. Deatra Ina.    Reason for Consultation:  Anemia.     HPI: [name] is a 64 y.o. female.  Severe pulmonary htn/cor pulmonale. SS trait.  ABL anemia requiring 2 PRBCs in 03/2014; treated with Ferheme infusion 04/2014. Scleroderma/systemic sclerosis.  Chronic right heart failure (EF 60 to 65%).  Massive pericardial effusion with tamponade requiring pericardiocentesis 04/2014; no recurrent effusion on echo 11/05/14.  Afib with RVR 04/2014, not a candidate for [facility].  Type 2 DM with CKD.  O/A s/p left TKR. Diabetic neuropathy.    GI history/studies:  03/2014 EGD for melena.  Dr Fuller Plan.  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 The EGD otherwise appeared normal 09/2013  Colonoscopy for fecal incontinence.  Dr Deatra Ina Multiple pan-colonic AVMs, more dense on right, none bleeding.  Faint pan-colonic erythema, biopsy/path: microscopic colitis.  02/2010 EGD for dysphagia and hx scleroderma. Dr Samuella Cota in the total esophagus - s/p [facility] dilatation.   Otherwise normal EGD. 09/2008 GES: confirmed 49% retention at 2 hours, c/w gastroparesis.  08/2008 EGD per Dr Lajoyce Corners Diminished peristalsis, otherwise normal.  08/2008 Colonoscopy for abd pain.  Dr Lajoyce Corners.  Internal hemorrhoids, o/w normal.   She has not taken her meds for last 2 weeks (no refills per pharmacy) finally got refills and restarted meds 10/4.  On 10/7 called cards office c/o limb swelling and some increased dyspnea. Dr Haroldine Laws called in [place].  Seen at heart failure clinic this AM and had no improvement over the weekend. C/o fatigue,  no energy.  SOB with dressing and at rest.  Sleeping on several pillows.  Several falls at home, nearly every day in last week. No chest pain or dizziness.  Weight up 20#.  Hgb is 5.9, was stable at 8.8 to 9.3 in 04/2014 through 08/2014.  MCV is low at 62  Pt has yellow stools, no blood in BM.  Since she ran out of iron 2 or 3 months ago, stools stopped being black/dark.  Good appetite, no early satiety, no nausea, no abd pain (some left flank pain though).  No dysphagia.  No using NSAIDS, ASA   Past Medical History  Diagnosis Date  . Secondary pulmonary hypertension ([place])     right heart cath 04/20/04  . Cough   . Allergic rhinitis, cause unspecified   . Esophageal reflux   . Systemic sclerosis ([place])   . Unspecified essential hypertension   . Gastritis   . Sickle cell trait ([place])     "trace"  . Obesity   . Visual changes   . Scleroderma ([place])   . Diastolic dysfunction   . Trichomonas   . Vaginal bleeding   . Neuropathy ([place])   . CHF (congestive heart failure) ([place])   . Type II diabetes mellitus (HCC)     DIET CONTROL   . Bursitis   .  History of blood transfusion     "think it was related to when I had partial hysteretomy; might have been for one of my knee ORs"  . Degeneration of lumbar or lumbosacral intervertebral disc   . Arthritis     "all over my body" (08/04/2013)  . Chronic kidney disease     "been in hospital for it; don't know what kind" (08/04/2013)    Past Surgical History  Procedure Laterality Date  . Tubal ligation    . Esophagogastroduodenoscopy  02/23/2010    multiple  . Shoulder arthroscopy Right 12/2009    subacromial decompression  . Replacement total knee Left 11/2000  . Cardiac catheterization Right 04/24/2004  . Tonsillectomy  1960's  . Abdominal hysterectomy  1983    "partial"  . Joint replacement    . Shoulder arthroscopy w/ rotator cuff repair Right 01/2010  . Excisional total knee arthroplasty with antibiotic spacers Left 08/2010    "got infected &  had to take 1st replacement out"  . Revision total knee arthroplasty Left 11/2010    "removed spacers; replaced knee"  . Total knee revision with scar debridement/patella revision with poly exchange Left 12/2010    fell; knee split opened; had to redo revision"  . Cardiac catheterization Left 07/2010  . Peripherally inserted central catheter insertion  09/2010  . Colonoscopy  09/06/2008    normal----Dr. Lajoyce Corners   . Right heart catheterization N/A 03/18/2014    Procedure: RIGHT HEART CATH;  Surgeon: Larey Dresser, MD;  Location: [facility] CATH LAB;  Service: Cardiovascular;  Laterality: N/A;  . Esophagogastroduodenoscopy N/A 03/23/2014    Procedure: ESOPHAGOGASTRODUODENOSCOPY (EGD);  Surgeon: Ladene Artist, MD;  Location[facility] ENDOSCOPY;  Service: Endoscopy;  Laterality: N/A;  . Pericardial tap N/A 05/03/2014    Procedure: PERICARDIAL TAP;  Surgeon: Sinclair Grooms, MD;  Location: [facility] CATH LAB;  Service: Cardiovascular;  Laterality: N/A;    Prior to Admission medications   Medication Sig Start Date End Date Taking? Authorizing Provider  ADCIRCA 20 MG TABS TAKE 2 TABLETS (40MG) BY MOUTH ONCE DAILY WITH OR WITHOUT FOOD. CALL [phone] FOR REFILLS. 06/13/14   Collene Gobble, MD  ambrisentan (LETAIRIS) 10 MG tablet Take 1 tablet (10 mg total) by mouth daily. 11/25/14   Collene Gobble, MD  amiodarone (PACERONE) 200 MG tablet Take 1 tablet (200 mg total) by mouth daily. 12/17/14   Larey Dresser, MD  esomeprazole (NEXIUM) 40 MG capsule Take 40 mg by mouth 2 (two) times daily.     Historical Provider, MD  fenofibrate 54 MG tablet Take 1 tablet (54 mg total) by mouth daily. 03/25/14   Erick Colace, NP  ferrous sulfate ([employer] IRON) 325 (65 FE) MG tablet Take 325 mg by mouth daily with breakfast.    Historical Provider, MD  FLUoxetine (PROZAC) 20 MG capsule take 1 capsule by mouth every morning 11/13/14   Sid Falcon, MD  furosemide (LASIX) 80 MG tablet Take 1 tablet (80 mg total) by mouth 2 (two) times daily.  12/17/14   Larey Dresser, MD  HYDROcodone-acetaminophen (NORCO) 10-325 MG per tablet Take 1 tablet by mouth every 6 (six) hours as needed for moderate pain.  07/17/12   Historical Provider, MD  hydrOXYzine (ATARAX/VISTARIL) 10 MG tablet Take 1 tablet (10 mg total) by mouth 2 (two) times daily as needed for itching. 10/02/14   Sid Falcon, MD  metolazone (ZAROXOLYN) 2.5 MG tablet Take one tablet daily for 2 days 12/20/14  Jolaine Artist, MD  nortriptyline (PAMELOR) 10 MG capsule 2 tabs po qhs 08/19/14   Marcial Pacas, MD  pregabalin (LYRICA) 150 MG capsule Take 1 capsule (150 mg total) by mouth 3 (three) times daily. 08/19/14   Marcial Pacas, MD  promethazine (PHENERGAN) 12.5 MG tablet Take 1 tablet (12.5 mg total) by mouth every 6 (six) hours as needed for nausea or vomiting. 02/27/14   Sid Falcon, MD  Selexipag (UPTRAVI) 1600 MCG TABS Take 1,600 mcg by mouth 2 (two) times daily. 10/22/14   Jolaine Artist, MD    Scheduled Meds: . sodium chloride   Intravenous Once  . ambrisentan  10 mg Oral Daily  . amiodarone  200 mg Oral Daily  . fenofibrate  54 mg Oral Daily  . [START ON 12/25/2014] ferrous sulfate  325 mg Oral Q breakfast  . FLUoxetine  20 mg Oral q morning - 10a  . furosemide  80 mg Intravenous 3 times per day  . nortriptyline  20 mg Oral QHS  . pantoprazole  40 mg Oral Daily  . potassium chloride  20 mEq Oral BID  . potassium chloride  40 mEq Oral Once  . pregabalin  150 mg Oral BID  . Selexipag  1,600 mcg Oral BID  . sodium chloride  3 mL Intravenous Q12H  . Tadalafil (PAH)  40 mg Oral Daily   Infusions:   PRN Meds: sodium chloride, acetaminophen, HYDROcodone-acetaminophen, hydrOXYzine, ondansetron (ZOFRAN) IV, promethazine, sodium chloride   Allergies as of 12/23/2014 - Review Complete 12/23/2014  Allergen Reaction Noted  . Cephalexin Rash 05/01/2010  . Ciprofloxacin Rash 07/23/2010  . Codeine Other (See Comments)   . Contrast media [iodinated diagnostic agents] Hives  08/03/2011  . Iohexol Hives 03/04/2008    Family History  Problem Relation Age of Onset  . Heart disease Mother   . Diabetes Mother   . Diabetes Sister     Social History   Social History  . Marital Status: Legally Separated    Spouse Name: N/A  . Number of Children: 2  . Years of Education: 11   Occupational History  . Umemployed   . Disability     Social History Main Topics  . Smoking status: Never Smoker   . Smokeless tobacco: Never Used  . Alcohol Use: No  . Drug Use: No  . Sexual Activity: Not on file   Other Topics Concern  . Not on file   Social History Narrative    FAMILY HISTORY:  Significant for coronary artery disease and diabetes   Patient lives at home with granddaughter.    Patient has 2 children.    Patient has 11 years of education.    Patient is on disability.    Patient is right handed.    Patient is separated.      REVIEW OF SYSTEMS: Constitutional:  Fatigue, weakness per HPI ENT:  No nose bleeds Pulm:  Some increased dyspnea.  + orthopnea CV:  No palpitations, no LE edema.  GU:  No hematuria.  + decreased urinary volume but output increased with new diuretic over weekend.  GI:  Per HPI Heme:  Per HPI   Transfusions:  Per HPI, in 03/2014.  Neuro:  + foot numbness.  No syncope, no seizure hx. No headaches.  Able to read with magnifying lenses.  Derm:  No itching, no rash or sores.  Endocrine:  No sweats or chills.  No polyuria or dysuria Immunization: reviewed.  No documentation of 2016 flu  shot.  Travel:  None beyond local counties in last few months.    PHYSICAL EXAM: Vital signs in last 24 hours: Filed Vitals:   12/23/14 1356  BP: 104/50  Pulse: 79  Temp: 97.6 F (36.4 C)  Resp: 18   Wt Readings from Last 3 Encounters:  12/23/14 164 lb 4.8 oz (74.526 kg)  12/23/14 168 lb 8 oz (76.431 kg)  11/05/14 148 lb 6.4 oz (67.314 kg)   General: chronically ill looking AAF.  Head:  No asymmetry, signs of trauma or swelling  Eyes:  No  icterus.  + conj pallor.   Ears:  Not HOH  Nose:  No discharge or congestion Mouth:  Edentulous, moist/pink/clear oral mucosa.  Lower full denture plate in place was not removed.  Neck:  No mass, no TMG.  + JVD   Lungs:  bil crackles bases.  Overall decreased sounds Heart: RRR. 2/6 SEM.  S1/S2 audible, split s2.   Abdomen:  Mildly distended, tense.  NT.  + BS.  No HSM or bruits..   Rectal: light brown/yellow ochre colored stool is FOBT negative.  External hemorrhoids flat.  No internal masses   Musc/Skeltl: no joint erythema or contracture deformities.  Extremities:  1 to 2 + LE edema/Anasarca into both thighs which are tender to touch.   Neurologic:  Oriented x 3.  No tremor. Cognitively intact though slow verbal response.  No tremor.  Moves all 4 limbs, strength not tested.  Skin:  No telangectasia or sores.  No rash Tattoos:  None seen.  Nodes:  No cervical adenopathy.    Psych:  Pleasant, flat affect though not overtly depressed.  Calm, cooperative.   Intake/Output from previous day:   Intake/Output this shift: Total I/O In: -  Out: 650 [Urine:650]  LAB RESULTS:  Recent Labs  12/23/14 1225  WBC 5.5  HGB 5.8*  HCT 19.7*  PLT 281   BMET Lab Results  Component Value Date   NA 137 12/23/2014   NA 140 11/05/2014   NA 139 09/04/2014   K 3.3* 12/23/2014   K 4.4 11/05/2014   K 4.4 09/04/2014   CL 100* 12/23/2014   CL 106 11/05/2014   CL 102 09/04/2014   CO2 25 12/23/2014   CO2 25 11/05/2014   CO2 23 09/04/2014   GLUCOSE 95 12/23/2014   GLUCOSE 78 11/05/2014   GLUCOSE 85 09/04/2014   BUN 29* 12/23/2014   BUN 52* 11/05/2014   BUN 57* 09/04/2014   CREATININE 1.75* 12/23/2014   CREATININE 1.80* 11/05/2014   CREATININE 1.82* 09/04/2014   CALCIUM 8.4* 12/23/2014   CALCIUM 9.2 11/05/2014   CALCIUM 9.3 09/04/2014   LFT  Recent Labs  12/23/14 1225  PROT 7.4  ALBUMIN 2.6*  AST 23  ALT 8*  ALKPHOS 98  BILITOT 0.7   PT/INR Lab Results  Component Value Date     INR 1.19 03/17/2014   INR 1.46 12/26/2013   INR 1.68* 06/08/2013   PROTIME 18.7 08/20/2008   Hepatitis Panel No results for input(s): HEPBSAG, HCVAB, HEPAIGM, HEPBIGM in the last 72 hours. C-Diff No components found for: CDIFF Lipase     Component Value Date/Time   LIPASE 15 06/07/2013 1845    Drugs of Abuse     Component Value Date/Time   LABOPIA POSITIVE* 10/02/2010 1128   COCAINSCRNUR NONE DETECTED 10/02/2010 1128   LABBENZ NONE DETECTED 10/02/2010 1128   AMPHETMU NONE DETECTED 10/02/2010 [place] DETECTED 10/02/2010 1128   LABBARB  NONE DETECTED 10/02/2010 1128     RADIOLOGY STUDIES: No results found.  ENDOSCOPIC STUDIES: Per HPI  IMPRESSION:   *  Symptomatic microcytic anemia. Multifactorial including SS trait, lack of po iron for at least 3 months (suspect it has been longer), possible intermittent blood loss from known upper and lower GI tract AVMs (though currently FOBT negative).    *  Known duodenal and colonic AVMs.  S/p 03/2014 APC ablation of duodenal AVMs.    *  Right sided heart failure, cor pulmonale, pulmonary htn.  Volume overload due to running out of meds and only recently restarting as well as due to anemia.  Recent non-compliance with meds.    *  Hx dysphagia, megaesophagus, gastroparesis.  S/p empiric esoph dilatation 02/2010. Not currently symptomatic.   *   DM type 2.  On no meds for this.     PLAN:     *  Note orders for abd ultrasound and, if tapable ascites,  paracentesis per HF team.   *  With FOBT negative stool and no hx melena, BPR etc, not clear repeat EGD or colonoscopy indicated.  Defer decision to Dr Henrene Pastor.   *  Should restart po iron.  Probably needs parenteral iron given her hx of non-compliance. Ordered iron studies to help pharmacy determine Feraheme dose.    *  2 units PRBCs ordered.    Azucena Freed  12/23/2014, 3:03 PM Pager: [phone]  GI ATTENDING  History, labs, x-rays, and multiple prior endoscopy  reports reviewed. Patient seen and examined. Agree with comprehensive H&P as outlined above. Complicated medical history. Admitted with CHF. Asked to see for anemia. Appears to be multifactorial with iron deficiency component (likely due to known AVMs). Has NOT been on iron as instructed. No acute, subacute, or occult bleeding at this time. Suspect dilutional component from CHF. Recommend treating CHF (as you are planning), IV iron infusion per pharmacy prior to D/C, transfuse carefully if clinically indicated, and reemphasize daily PO iron indefinitely as out patient. Her PCP should monitor blood counts regularly as out patient. No plans for repeat endoscopic evaluations at this time. Call for questions or further assistance. Thank you. Will sign off.  Docia Chuck. Geri Semin[place]., M.D. [facility] Division of Gastroenterology

## 2014-12-23 NOTE — Progress Notes (Signed)
CRITICAL VALUE ALERT  Critical value received:  Hgb 5.9   Date of notification:  12/23/14  Time of notification:  1225  Critical value read back:  Nurse who received alert:  Philemon Kingdom D  MD notified (1st page):  [name]  Time of first page:  1230  MD notified (2nd page):  Time of second page:  Responding MD:  Aundra Dubin pt being admitted   Time MD responded:

## 2014-12-23 NOTE — Progress Notes (Signed)
Critical value alert of Hgb 5.9.  Most recent Hgb 8.8 x 3 months ago.  No active bleeding.  Ordered type and screen for prep and infuse UPRBCs x 2.   Will consult GI with hx of duodenal AVMs.  Legrand Como [address] [place], PA-C 12/23/2014 3:07 PM

## 2014-12-23 NOTE — Progress Notes (Signed)
Echocardiogram 2D Echocardiogram has been performed.  Tresa Res 12/23/2014, 4:42 PM

## 2014-12-23 NOTE — Progress Notes (Signed)
EKG CRITICAL VALUE     12 lead EKG performed.  Critical value noted.  [place], RN notified.   Yehuda Mao, [place] 12/23/2014 12:36 PM

## 2014-12-24 ENCOUNTER — Inpatient Hospital Stay (HOSPITAL_COMMUNITY): Payer: Medicare Other

## 2014-12-24 ENCOUNTER — Encounter (HOSPITAL_COMMUNITY): Payer: Self-pay | Admitting: General Practice

## 2014-12-24 DIAGNOSIS — K766 Portal hypertension: Secondary | ICD-10-CM

## 2014-12-24 DIAGNOSIS — D500 Iron deficiency anemia secondary to blood loss (chronic): Secondary | ICD-10-CM

## 2014-12-24 DIAGNOSIS — I272 Other secondary pulmonary hypertension: Secondary | ICD-10-CM

## 2014-12-24 DIAGNOSIS — I5033 Acute on chronic diastolic (congestive) heart failure: Secondary | ICD-10-CM

## 2014-12-24 LAB — BASIC METABOLIC PANEL
ANION GAP: 14 (ref 5–15)
BUN: 29 mg/dL — ABNORMAL HIGH (ref 6–20)
CALCIUM: 8.5 mg/dL — AB (ref 8.9–10.3)
CO2: 24 mmol/L (ref 22–32)
CREATININE: 1.73 mg/dL — AB (ref 0.44–1.00)
Chloride: 100 mmol/L — ABNORMAL LOW (ref 101–111)
GFR, EST AFRICAN AMERICAN: 35 mL/min — AB (ref >60)
GFR, EST NON AFRICAN AMERICAN: 30 mL/min — AB (ref >60)
Glucose, Bld: 82 mg/dL (ref 65–99)
Potassium: 4.5 mmol/L (ref 3.5–5.1)
SODIUM: 138 mmol/L (ref 135–145)

## 2014-12-24 LAB — CBC
HCT: 23.3 % — ABNORMAL LOW (ref 36.0–46.0)
Hemoglobin: 7.2 g/dL — ABNORMAL LOW (ref 12.0–15.0)
MCH: 20.7 pg — AB (ref 26.0–34.0)
MCHC: 30.9 g/dL (ref 30.0–36.0)
MCV: 67 fL — ABNORMAL LOW (ref 78.0–100.0)
PLATELETS: 321 10*3/uL (ref 150–400)
RBC: 3.48 MIL/uL — AB (ref 3.87–5.11)
RDW: 24.5 % — ABNORMAL HIGH (ref 11.5–15.5)
WBC: 5.7 10*3/uL (ref 4.0–10.5)

## 2014-12-24 MED ORDER — INFLUENZA VAC SPLIT QUAD 0.5 ML IM SUSY
0.5000 mL | PREFILLED_SYRINGE | INTRAMUSCULAR | Status: AC
  Administered 2014-12-25: 0.5 mL via INTRAMUSCULAR
  Filled 2014-12-24: qty 0.5

## 2014-12-24 MED ORDER — HYDROCODONE-ACETAMINOPHEN 10-325 MG PO TABS
1.0000 | ORAL_TABLET | Freq: Once | ORAL | Status: AC
  Administered 2014-12-24: 1 via ORAL
  Filled 2014-12-24: qty 1

## 2014-12-24 MED ORDER — SODIUM CHLORIDE 0.9 % IV SOLN
510.0000 mg | Freq: Once | INTRAVENOUS | Status: AC
  Administered 2014-12-24: 510 mg via INTRAVENOUS
  Filled 2014-12-24: qty 17

## 2014-12-24 NOTE — Progress Notes (Signed)
Pt BP 113/55, pt still c/o leg pain. RN to room with pain medicine, pt requested to take all other nighttime medicines as well.

## 2014-12-24 NOTE — Progress Notes (Signed)
Pharmacy notified RN that letairis is not available through hospital pharmacy.  RN notified pharmacy that RN gave letairis yesterday at 1758 and the medication was tubed from pharmacy.  Pharmacy told RN to ask pt to bring medication from home and it would be dispensed from them.  Will notify pt and continue to monitor.

## 2014-12-24 NOTE — Progress Notes (Signed)
Pt c/o 10/10 pain in legs, pt has been having this pain this admission, but states tonight is worse. Pt with no PRN due at this time. Serita Butcher paged and new order for one time extra dose Norco.

## 2014-12-24 NOTE — Progress Notes (Signed)
Utilization review completed

## 2014-12-24 NOTE — Progress Notes (Signed)
Advanced Heart Failure Rounding Note  Pulmonary: Dr Lamonte Sakai  HF: [name]  Subjective:    Feels better today. Swelling in legs gone down with TED hose and keeping them propped up in bed.  SOB improved.  No lightheadedness or dizziness.  Out 4.7 L and down 8 lbs on IV lasix 80 mg q8 hrs. Cr stable. Remains 15 lbs up.  Objective:   Weight Range: 156 lb 1.6 oz (70.806 kg) Body mass index is 26.78 kg/(m^2).   Vital Signs:   Temp:  [97.6 F (36.4 C)-98.9 F (37.2 C)] 98.9 F (37.2 C) (10/11 0546) Pulse Rate:  [60-83] 83 (10/11 0546) Resp:  [16-18] 17 (10/11 0546) BP: (96-114)/(45-60) 99/50 mmHg (10/11 0650) SpO2:  [94 %-100 %] 94 % (10/11 0546) Weight:  [156 lb 1.6 oz (70.806 kg)-168 lb 8 oz (76.431 kg)] 156 lb 1.6 oz (70.806 kg) (10/11 0546) Last BM Date: 12/24/14  Weight change: Filed Weights   12/23/14 1356 12/24/14 0546  Weight: 164 lb 4.8 oz (74.526 kg) 156 lb 1.6 oz (70.806 kg)    Intake/Output:   Intake/Output Summary (Last 24 hours) at 12/24/14 0809 Last data filed at 12/24/14 0754  Gross per 24 hour  Intake    790 ml  Output   6400 ml  Net  -5610 ml     Physical Exam: General: Elderly and fatigued appearing. Neck: JVP 14 cm, no thyromegaly or thyroid nodule.  Lungs: diminished, bilateral crackles in bases. CV: RV heave. Heart regular S1/S2 with widely split S2, 2/6 HSM LLSB. TED hose present. 1+ edema into thighs. Abdomen: no hepatosplenomegaly. Distended, tight, mildly tender. Neurologic: Alert and oriented x 3.  Psych: Affect flat. Extremities: No clubbing or cyanosis.   Telemetry:  NSR 70s-80s  Labs: CBC  Recent Labs  12/23/14 1225 12/24/14 0500  WBC 5.5 5.7  HGB 5.8* 7.2*  HCT 19.7* 23.3*  MCV 62.5* 67.0*  PLT 281 376   Basic Metabolic Panel  Recent Labs  12/23/14 1225 12/24/14 0500  NA 137 138  K 3.3* 4.5  CL 100* 100*  CO2 25 24  GLUCOSE 95 82  BUN 29* 29*  CREATININE 1.75* 1.73*  CALCIUM 8.4* 8.5*   Liver Function  Tests  Recent Labs  12/23/14 1225  AST 23  ALT 8*  ALKPHOS 98  BILITOT 0.7  PROT 7.4  ALBUMIN 2.6*   No results for input(s): LIPASE, AMYLASE in the last 72 hours. Cardiac Enzymes No results for input(s): CKTOTAL, CKMB, CKMBINDEX, TROPONINI in the last 72 hours.  BNP: BNP (last 3 results)  Recent Labs  05/02/14 1022 11/05/14 1230 12/23/14 1226  BNP 670.3* 551.7* 570.5*    ProBNP (last 3 results)  Recent Labs  12/26/13 1210  PROBNP 1194*     D-Dimer No results for input(s): DDIMER in the last 72 hours. Hemoglobin A1C No results for input(s): HGBA1C in the last 72 hours. Fasting Lipid Panel No results for input(s): CHOL, HDL, LDLCALC, TRIG, CHOLHDL, LDLDIRECT in the last 72 hours. Thyroid Function Tests  Recent Labs  12/23/14 1227  TSH 8.788*    Other results:     Imaging/Studies:   No results found.  Latest Echo  Latest Cath   Medications:     Scheduled Medications: . sodium chloride   Intravenous Once  . ambrisentan  10 mg Oral Daily  . amiodarone  200 mg Oral Daily  . fenofibrate  54 mg Oral Daily  . [START ON 12/25/2014] ferrous sulfate  325 mg Oral Q  breakfast  . FLUoxetine  20 mg Oral q morning - 10a  . furosemide  80 mg Intravenous 3 times per day  . nortriptyline  20 mg Oral QHS  . pantoprazole  40 mg Oral Daily  . potassium chloride  20 mEq Oral BID  . pregabalin  150 mg Oral BID  . Selexipag  1,600 mcg Oral BID  . sodium chloride  3 mL Intravenous Q12H  . Tadalafil (PAH)  40 mg Oral Daily     Infusions:     PRN Medications:  sodium chloride, acetaminophen, HYDROcodone-acetaminophen, hydrOXYzine, ondansetron (ZOFRAN) IV, promethazine, sodium chloride   Assessment/Plan  Ms. Kolarik is a 64 yo female with a history of DM2, HLD, HTN, pHTN, GERD, gastroparesis, scleroderma, DJD and diastolic HF admitted from the HF clinic on 12/23/14 with marked volume overload.  Admission labs were significant for severe anemia. GI  consulted and 2x UPRBCs given. 1. Acute on Chronic Diastolic HF: RV dysfunction 2/2 to severe PAH in the setting of scleroderma. Echo 12/23/14 withEF 60-65%, moderately dilated RV, PASP 72, no pericardial effusion. NYHA class IIIb symptoms. Remains very volume overloaded but diuresing well so far.  Stable creatinine. No ascites on [place]. - Continue IV lasix 80 mg q 8 hrs. - Continue TED hose.   2. Marked Anemia:  Hgb 7.2 this am after receiving 2 units PRBCs.  GI following with hx of GI bleed and duodenal AVMs.  Does not appear to be actively bleeding, heme negative.  Likely chronic Fe deficiency, not taking Fe at home.   - Will give IV ferraheme with dosing per pharm. 2. PAH with cor pulmonale: Severe PAH. Had recent [place] in 1/16. Pulmonary pressure remains elevated by echo 12/23/14.  - Continue ambrisentan 10 mg daily.  - Continue selexipag 1600 mcg bid.  - Continue Adcirca 40 mg daily.  3. CKD III: Will follow closely with IV diuresis, stable so far.  4. Hx of Pericardial effusion: Resolved after pericardiocentesis, not seen on echo 10/2014 or 12/2014. 5. Atrial fibrillation: Regular rhythm. Paroxysmal. Continue amiodarone. She is not anticoagulated due to h/o GI bleeding. TSH elevated at 8.788, send free T3 and T4. LFTs OK. She will need a yearly eye exam while on amiodarone.  Length of Stay: 1  Shirley Friar PA-C 12/24/2014, 8:09 AM  Advanced Heart Failure Team Pager [phone] (M-F; 7a - 4p)  Please contact [facility] for night-coverage after hours (4p -7a ) and weekends on [email or web address]  Patient seen with PA, agree with the above note.  Still very volume overloaded but diuresing well.  Continue current Lasix.  Hemoglobin coming up, no active bleeding.  Will give IV iron.   [name] 12/24/2014 9:25 AM

## 2014-12-24 NOTE — Progress Notes (Signed)
Patient presented to [place] for paracentesis, upon reviewing [place] images no ascites is seen. Procedure was cancelled.  Tsosie Billing PA-C Interventional Radiology  12/24/14  8:59 AM

## 2014-12-24 NOTE — Progress Notes (Signed)
Pt BP systolic less than 622 tonight, pt asymptomatic, pt already received 1 unit PRBC, output of 2666m since 6pm dose of lasix. Spoke with Dr. FTommi Rumpswith cardiology regarding 10PM dose of 80IV lasix. Ordered ok to give patient lasix since pt is receiving second unit PRBC. Will continue to monitor. RRonnette Hila RN

## 2014-12-25 DIAGNOSIS — Z9181 History of falling: Secondary | ICD-10-CM | POA: Diagnosis not present

## 2014-12-25 DIAGNOSIS — I272 Other secondary pulmonary hypertension: Secondary | ICD-10-CM | POA: Diagnosis not present

## 2014-12-25 DIAGNOSIS — Z833 Family history of diabetes mellitus: Secondary | ICD-10-CM | POA: Diagnosis not present

## 2014-12-25 DIAGNOSIS — D509 Iron deficiency anemia, unspecified: Secondary | ICD-10-CM

## 2014-12-25 DIAGNOSIS — E039 Hypothyroidism, unspecified: Secondary | ICD-10-CM | POA: Diagnosis not present

## 2014-12-25 DIAGNOSIS — N183 Chronic kidney disease, stage 3 (moderate): Secondary | ICD-10-CM | POA: Diagnosis not present

## 2014-12-25 DIAGNOSIS — K3184 Gastroparesis: Secondary | ICD-10-CM | POA: Diagnosis not present

## 2014-12-25 DIAGNOSIS — I2781 Cor pulmonale (chronic): Secondary | ICD-10-CM | POA: Diagnosis not present

## 2014-12-25 DIAGNOSIS — M1990 Unspecified osteoarthritis, unspecified site: Secondary | ICD-10-CM | POA: Diagnosis not present

## 2014-12-25 DIAGNOSIS — E669 Obesity, unspecified: Secondary | ICD-10-CM | POA: Diagnosis not present

## 2014-12-25 DIAGNOSIS — Z8249 Family history of ischemic heart disease and other diseases of the circulatory system: Secondary | ICD-10-CM | POA: Diagnosis not present

## 2014-12-25 DIAGNOSIS — Z79899 Other long term (current) drug therapy: Secondary | ICD-10-CM | POA: Diagnosis not present

## 2014-12-25 DIAGNOSIS — E1143 Type 2 diabetes mellitus with diabetic autonomic (poly)neuropathy: Secondary | ICD-10-CM | POA: Diagnosis not present

## 2014-12-25 DIAGNOSIS — Z6824 Body mass index (BMI) 24.0-24.9, adult: Secondary | ICD-10-CM | POA: Diagnosis not present

## 2014-12-25 DIAGNOSIS — I5033 Acute on chronic diastolic (congestive) heart failure: Secondary | ICD-10-CM | POA: Diagnosis not present

## 2014-12-25 DIAGNOSIS — I130 Hypertensive heart and chronic kidney disease with heart failure and stage 1 through stage 4 chronic kidney disease, or unspecified chronic kidney disease: Secondary | ICD-10-CM | POA: Diagnosis not present

## 2014-12-25 DIAGNOSIS — K219 Gastro-esophageal reflux disease without esophagitis: Secondary | ICD-10-CM | POA: Diagnosis not present

## 2014-12-25 DIAGNOSIS — R531 Weakness: Secondary | ICD-10-CM | POA: Diagnosis not present

## 2014-12-25 DIAGNOSIS — Z9119 Patient's noncompliance with other medical treatment and regimen: Secondary | ICD-10-CM | POA: Diagnosis not present

## 2014-12-25 DIAGNOSIS — M349 Systemic sclerosis, unspecified: Secondary | ICD-10-CM | POA: Diagnosis not present

## 2014-12-25 DIAGNOSIS — Q2733 Arteriovenous malformation of digestive system vessel: Secondary | ICD-10-CM | POA: Diagnosis not present

## 2014-12-25 DIAGNOSIS — I4510 Unspecified right bundle-branch block: Secondary | ICD-10-CM | POA: Diagnosis not present

## 2014-12-25 DIAGNOSIS — E785 Hyperlipidemia, unspecified: Secondary | ICD-10-CM | POA: Diagnosis not present

## 2014-12-25 DIAGNOSIS — D573 Sickle-cell trait: Secondary | ICD-10-CM | POA: Diagnosis not present

## 2014-12-25 DIAGNOSIS — E1122 Type 2 diabetes mellitus with diabetic chronic kidney disease: Secondary | ICD-10-CM | POA: Diagnosis not present

## 2014-12-25 LAB — CBC
HCT: 23.5 % — ABNORMAL LOW (ref 36.0–46.0)
Hemoglobin: 7.3 g/dL — ABNORMAL LOW (ref 12.0–15.0)
MCH: 20.9 pg — ABNORMAL LOW (ref 26.0–34.0)
MCHC: 31.1 g/dL (ref 30.0–36.0)
MCV: 67.1 fL — AB (ref 78.0–100.0)
PLATELETS: 278 10*3/uL (ref 150–400)
RBC: 3.5 MIL/uL — AB (ref 3.87–5.11)
RDW: 25 % — AB (ref 11.5–15.5)
WBC: 5.8 10*3/uL (ref 4.0–10.5)

## 2014-12-25 LAB — BASIC METABOLIC PANEL
ANION GAP: 15 (ref 5–15)
BUN: 30 mg/dL — AB (ref 6–20)
CHLORIDE: 97 mmol/L — AB (ref 101–111)
CO2: 24 mmol/L (ref 22–32)
CREATININE: 1.81 mg/dL — AB (ref 0.44–1.00)
Calcium: 8.8 mg/dL — ABNORMAL LOW (ref 8.9–10.3)
GFR calc Af Amer: 33 mL/min — ABNORMAL LOW (ref >60)
GFR, EST NON AFRICAN AMERICAN: 29 mL/min — AB (ref >60)
Glucose, Bld: 77 mg/dL (ref 65–99)
Potassium: 4.2 mmol/L (ref 3.5–5.1)
Sodium: 136 mmol/L (ref 135–145)

## 2014-12-25 LAB — URIC ACID: Uric Acid, Serum: 13.2 mg/dL — ABNORMAL HIGH (ref 2.3–6.6)

## 2014-12-25 NOTE — Evaluation (Signed)
Physical Therapy Evaluation Patient Details Name: [name] MRN: [id number] DOB: 03-02-1951 Today's Date: 12/25/2014   History of Present Illness  Ms. Sperl is a 64 yo female with a history of DM2, HLD, HTN, pHTN, GERD, gastroparesis, scleroderma, DJD and diastolic HF.   Admitted with acute on chronic CHF.  Also with PAH with cor pulmonale  Clinical Impression  Patient presents with decreased independence with mobility due to deficits listed in PT problem list.  She will benefit from skilled PT in the acute setting to allow return home with HHPT and 24 hour assist if available or to SNF if not improved and no 24 hour assist available.    Follow Up Recommendations Home health PT;Supervision/Assistance - 24 hour;SNF    Equipment Recommendations  None recommended by PT    Recommendations for Other Services       Precautions / Restrictions Precautions Precautions: Fall      Mobility  Bed Mobility Overal bed mobility: Needs Assistance Bed Mobility: Supine to Sit;Sit to Supine     Supine to sit: Min assist Sit to supine: Supervision   General bed mobility comments: with rail and increased HOB to sit pt pulled up on my hand, flat bed to supine  Transfers Overall transfer level: Needs assistance Equipment used: Rolling walker (2 wheeled) Transfers: Sit to/from Stand Sit to Stand: Min assist;Mod assist         General transfer comment: increased assist needed up from bed without walker, better with walker, c/o LE pain in standing with tenderness in calves  Ambulation/Gait Ambulation/Gait assistance: Min assist Ambulation Distance (Feet): 12 Feet (6' fwd and 6' back) Assistive device: Rolling walker (2 wheeled) Gait Pattern/deviations: Step-to pattern;Trunk flexed;Wide base of support     General Gait Details: limited due to pain in lower legs  Stairs            Wheelchair Mobility    Modified Rankin (Stroke Patients Only)       Balance Overall  balance assessment: Needs assistance Sitting-balance support: Feet supported Sitting balance-Leahy Scale: Fair     Standing balance support: Bilateral upper extremity supported Standing balance-Leahy Scale: Poor Standing balance comment: UE support needed for standing due to LE pain                             Pertinent Vitals/Pain Pain Assessment: Faces Faces Pain Scale: Hurts worst Pain Location: both legs in calves with attempted ambulation Pain Descriptors / Indicators: Sharp Pain Intervention(s): Repositioned;Limited activity within patient's tolerance;Monitored during session    Home Living Family/patient expects to be discharged to:: Private residence Living Arrangements: Alone Available Help at Discharge: Family;Available PRN/intermittently Type of Home: Apartment Home Access: Level entry     Home Layout: One level Home Equipment: Walker - 2 wheels;Kasandra Knudsen - single point Additional Comments: reports grandaughter goes to school. multile other family members, but doubt able to have 24/7 assist    Prior Function Level of Independence: Independent               Hand Dominance        Extremity/Trunk Assessment   Upper Extremity Assessment: Generalized weakness           Lower Extremity Assessment: Generalized weakness;RLE deficits/detail;LLE deficits/detail RLE Deficits / Details: edema throughout lower leg and knee (worse on left), numbness on plantar surface of feet LLE Deficits / Details: edema in knee and lower leg, numbness plantar surface of feet  Cervical / Trunk Assessment: Kyphotic  Communication   Communication: No difficulties  Cognition Arousal/Alertness: Awake/alert Behavior During Therapy: Flat affect Overall Cognitive Status: Within Functional Limits for tasks assessed                      General Comments      Exercises Total Joint Exercises Ankle Circles/Pumps: AROM;5 reps;Both;Supine      Assessment/Plan     PT Assessment Patient needs continued PT services  PT Diagnosis Difficulty walking;Acute pain   PT Problem List Decreased strength;Pain;Decreased balance;Decreased mobility;Decreased activity tolerance;Decreased knowledge of use of DME  PT Treatment Interventions DME instruction;Balance training;Gait training;Functional mobility training;Patient/family education;Therapeutic activities;Therapeutic exercise   PT Goals (Current goals can be found in the Care Plan section) Acute Rehab PT Goals Patient Stated Goal: To walk without pain PT Goal Formulation: With patient Time For Goal Achievement: 01/08/15 Potential to Achieve Goals: Good    Frequency Min 3X/week   Barriers to discharge Decreased caregiver support      Co-evaluation               End of Session Equipment Utilized During Treatment: Gait belt Activity Tolerance: Patient limited by pain Patient left: in bed;with call bell/phone within reach           Time: 4944-9675 PT Time Calculation (min) (ACUTE ONLY): 25 min   Charges:   PT Evaluation $Initial PT Evaluation Tier I: 1 Procedure PT Treatments $Therapeutic Activity: 8-22 mins   PT G Codes:        WYNN,CYNDI 12-27-14, 3:48 PM  Magda Kiel, [place] December 27, 2014

## 2014-12-25 NOTE — Progress Notes (Signed)
Advanced Heart Failure Rounding Note  Pulmonary: Dr Lamonte Sakai  HF: Marcio Hoque  Subjective:    Feels ok today.  Still swollen.  Wants to know how her heart is doing.  Talked about echo being stable from previous.   Out 3.9 L and down 4 lbs on IV lasix 80 mg q8 hrs. Cr relatively stable. Remains ~ 10 lbs up.  Objective:   Weight Range: 152 lb 8 oz (69.174 kg) Body mass index is 26.16 kg/(m^2).   Vital Signs:   Temp:  [97.8 F (36.6 C)-98.8 F (37.1 C)] 98.1 F (36.7 C) (10/12 0533) Pulse Rate:  [78-83] 78 (10/12 0533) Resp:  [16-20] 16 (10/12 0533) BP: (96-113)/(47-55) 96/55 mmHg (10/12 0533) SpO2:  [96 %-100 %] 100 % (10/12 0533) Weight:  [152 lb 8 oz (69.174 kg)] 152 lb 8 oz (69.174 kg) (10/12 0539) Last BM Date: 12/24/14  Weight change: Filed Weights   12/23/14 1356 12/24/14 0546 12/25/14 0539  Weight: 164 lb 4.8 oz (74.526 kg) 156 lb 1.6 oz (70.806 kg) 152 lb 8 oz (69.174 kg)    Intake/Output:   Intake/Output Summary (Last 24 hours) at 12/25/14 0802 Last data filed at 12/25/14 0539  Gross per 24 hour  Intake   1010 ml  Output   3500 ml  Net  -2490 ml     Physical Exam: General: Elderly and fatigued. Neck: JVP 11-12 cm, no thyromegaly or thyroid nodule.  Lungs: Diminished bibasilar with slight crackles CV: RV heave. Heart regular S1/S2 with widely split S2, 2/6 HSM LLSB. 1+ edema into thighs. Abdomen: no HSM. Soft, mildly distended, non tender. Neurologic: Alert and oriented x 3.  Psych: Affect flat. Extremities: No clubbing or cyanosis.   Telemetry:  NSR 70s  Labs: CBC  Recent Labs  12/24/14 0500 12/25/14 0317  WBC 5.7 5.8  HGB 7.2* 7.3*  HCT 23.3* 23.5*  MCV 67.0* 67.1*  PLT 321 916   Basic Metabolic Panel  Recent Labs  12/24/14 0500 12/25/14 0317  NA 138 136  K 4.5 4.2  CL 100* 97*  CO2 24 24  GLUCOSE 82 77  BUN 29* 30*  CREATININE 1.73* 1.81*  CALCIUM 8.5* 8.8*   Liver Function Tests  Recent Labs  12/23/14 1225  AST 23  ALT  8*  ALKPHOS 98  BILITOT 0.7  PROT 7.4  ALBUMIN 2.6*   No results for input(s): LIPASE, AMYLASE in the last 72 hours. Cardiac Enzymes No results for input(s): CKTOTAL, CKMB, CKMBINDEX, TROPONINI in the last 72 hours.  BNP: BNP (last 3 results)  Recent Labs  05/02/14 1022 11/05/14 1230 12/23/14 1226  BNP 670.3* 551.7* 570.5*    ProBNP (last 3 results)  Recent Labs  12/26/13 1210  PROBNP 1194*     D-Dimer No results for input(s): DDIMER in the last 72 hours. Hemoglobin A1C No results for input(s): HGBA1C in the last 72 hours. Fasting Lipid Panel No results for input(s): CHOL, HDL, LDLCALC, TRIG, CHOLHDL, LDLDIRECT in the last 72 hours. Thyroid Function Tests  Recent Labs  12/23/14 1227  TSH 8.788*    Other results:     Imaging/Studies:  Dg Chest 2 View  12/24/2014  CLINICAL DATA:  Weakness today, history of acute and chronic CHF, diabetes, scleroderma, chronic renal insufficiency EXAM: CHEST  2 VIEW COMPARISON:  Portable chest x-ray of May 03, 2014 FINDINGS: The cardiopericardial silhouette remains enlarged. The pulmonary vascularity is engorged. The pulmonary interstitial markings are increased and are more conspicuous than in the past. The  hemidiaphragms remain visible. The observed bony thorax exhibits no acute abnormality. IMPRESSION: CHF with mild pulmonary vascular congestion and pulmonary interstitial edema. There is no alveolar edema, pneumonia, or pleural effusion. Electronically Signed   By: David  Martinique M.D.   On: 12/24/2014 09:31   US Abdomen Limited  12/24/2014  CLINICAL DATA:  Abdominal distension evaluate for possible ascites, history of congestive heart failure EXAM: LIMITED ABDOMEN ULTRASOUND FOR ASCITES TECHNIQUE: Limited ultrasound survey for ascites was performed in all four abdominal quadrants. COMPARISON:  None. FINDINGS: Four quadrant evaluation of the abdomen shows no ascites IMPRESSION: No ascites Electronically Signed   By: Skipper Cliche M.D.   On: 12/24/2014 09:22    Latest Echo  Latest Cath   Medications:     Scheduled Medications: . sodium chloride   Intravenous Once  . ambrisentan  10 mg Oral Daily  . amiodarone  200 mg Oral Daily  . fenofibrate  54 mg Oral Daily  . ferrous sulfate  325 mg Oral Q breakfast  . FLUoxetine  20 mg Oral q morning - 10a  . furosemide  80 mg Intravenous 3 times per day  . Influenza vac split quadrivalent PF  0.5 mL Intramuscular Tomorrow-1000  . nortriptyline  20 mg Oral QHS  . pantoprazole  40 mg Oral Daily  . potassium chloride  20 mEq Oral BID  . pregabalin  150 mg Oral BID  . Selexipag  1,600 mcg Oral BID  . sodium chloride  3 mL Intravenous Q12H  . Tadalafil (PAH)  40 mg Oral Daily    Infusions:    PRN Medications: sodium chloride, acetaminophen, HYDROcodone-acetaminophen, hydrOXYzine, ondansetron (ZOFRAN) IV, promethazine, sodium chloride   Assessment/Plan  Ms. Cea is a 64 yo female with a history of DM2, HLD, HTN, pHTN, GERD, gastroparesis, scleroderma, DJD and diastolic HF admitted from the HF clinic on 12/23/14 with marked volume overload.  Admission labs were significant for severe anemia. GI consulted and 2x UPRBCs given.   1. Acute on Chronic Diastolic HF: RV dysfunction 2/2 to severe PAH in the setting of scleroderma. Echo 12/23/14 withEF 60-65%, moderately dilated RV, PASP 72, no pericardial effusion. NYHA class IIIb symptoms.  - Improving but remains overloaded. No ascites on [place]. - Continue IV lasix 80 mg q 8 hrs for today.  Cr slowly trending up but relatively stable. - Continue TED hose.   2. Marked Anemia:  Hgb 7.3 this am after receiving 2 units PRBCs and 510 mg IV feraheme.  - GI following with hx of GI bleed and duodenal AVMs.   - Does not appear to be actively bleeding, heme negative.   - Likely chronic Fe deficiency, not taking Fe at home.   - Will repeat IV ferraheme with dosing per pharm. (Needs approx 1 g of iron total to correct  deficit) 2. PAH with cor pulmonale: Severe PAH. Had recent [place] in 1/16. Pulmonary pressure remains elevated by echo 12/23/14.  - Continue ambrisentan 10 mg daily.  - Continue selexipag 1600 mcg bid.  - Continue Adcirca 40 mg daily.  3. CKD III: Will follow closely with IV diuresis, stable so far with only slight up trend.  4. Hx of Pericardial effusion: Resolved after pericardiocentesis, not seen on echo 10/2014 or 12/2014. 5. Atrial fibrillation: Regular rhythm. Paroxysmal.  - Continue amiodarone. - She is not anticoagulated due to h/o GI bleeding. TSH elevated at 8.788, send free T3 and T4. LFTs OK. She will need a yearly eye exam while on amiodarone.  6. Dispo - Will have PT/OT see to best assess needs for home.  Length of Stay: 2  Shirley Friar PA-C 12/25/2014, 8:02 AM  Advanced Heart Failure Team Pager [phone] (M-F; 7a - 4p)  Please contact [facility] for night-coverage after hours (4p -7a ) and weekends on [email or web address]  Patient seen with PA, agree with the above note.    She is still very volume overloaded but diuresing well and weight coming down.  Creatinine stable so far.  Continue Lasix 80 mg IV every 8 hrs.   Will get another dose of ferraheme today.  Will transfuse hgb < 7.   [name] 12/25/2014 9:14 AM

## 2014-12-26 ENCOUNTER — Inpatient Hospital Stay (HOSPITAL_COMMUNITY): Payer: Medicare Other

## 2014-12-26 DIAGNOSIS — I480 Paroxysmal atrial fibrillation: Secondary | ICD-10-CM

## 2014-12-26 DIAGNOSIS — N183 Chronic kidney disease, stage 3 (moderate): Secondary | ICD-10-CM

## 2014-12-26 DIAGNOSIS — M79606 Pain in leg, unspecified: Secondary | ICD-10-CM

## 2014-12-26 DIAGNOSIS — I82409 Acute embolism and thrombosis of unspecified deep veins of unspecified lower extremity: Secondary | ICD-10-CM

## 2014-12-26 LAB — BASIC METABOLIC PANEL
ANION GAP: 11 (ref 5–15)
BUN: 26 mg/dL — ABNORMAL HIGH (ref 6–20)
CALCIUM: 8.8 mg/dL — AB (ref 8.9–10.3)
CHLORIDE: 96 mmol/L — AB (ref 101–111)
CO2: 26 mmol/L (ref 22–32)
CREATININE: 1.7 mg/dL — AB (ref 0.44–1.00)
GFR, EST AFRICAN AMERICAN: 36 mL/min — AB (ref >60)
GFR, EST NON AFRICAN AMERICAN: 31 mL/min — AB (ref >60)
GLUCOSE: 90 mg/dL (ref 65–99)
Potassium: 3.7 mmol/L (ref 3.5–5.1)
Sodium: 133 mmol/L — ABNORMAL LOW (ref 135–145)

## 2014-12-26 LAB — CBC WITH DIFFERENTIAL/PLATELET
Basophils Absolute: 0 10*3/uL (ref 0.0–0.1)
Basophils Relative: 0 %
EOS PCT: 2 %
Eosinophils Absolute: 0.1 10*3/uL (ref 0.0–0.7)
HEMATOCRIT: 26.3 % — AB (ref 36.0–46.0)
Hemoglobin: 8.1 g/dL — ABNORMAL LOW (ref 12.0–15.0)
LYMPHS ABS: 1.4 10*3/uL (ref 0.7–4.0)
Lymphocytes Relative: 25 %
MCH: 20.7 pg — AB (ref 26.0–34.0)
MCHC: 30.8 g/dL (ref 30.0–36.0)
MCV: 67.3 fL — AB (ref 78.0–100.0)
MONO ABS: 0.5 10*3/uL (ref 0.1–1.0)
MONOS PCT: 9 %
NEUTROS ABS: 3.4 10*3/uL (ref 1.7–7.7)
Neutrophils Relative %: 64 %
PLATELETS: 289 10*3/uL (ref 150–400)
RBC: 3.91 MIL/uL (ref 3.87–5.11)
RDW: 26 % — AB (ref 11.5–15.5)
WBC: 5.4 10*3/uL (ref 4.0–10.5)

## 2014-12-26 LAB — TSH: TSH: 8.492 u[IU]/mL — ABNORMAL HIGH (ref 0.350–4.500)

## 2014-12-26 LAB — T4, FREE: FREE T4: 1.06 ng/dL (ref 0.61–1.12)

## 2014-12-26 MED ORDER — COLCHICINE 0.6 MG PO TABS
0.6000 mg | ORAL_TABLET | Freq: Every day | ORAL | Status: AC
  Administered 2014-12-27: 0.6 mg via ORAL
  Filled 2014-12-26: qty 1

## 2014-12-26 MED ORDER — COLCHICINE 0.6 MG PO TABS: 0.6000 mg | ORAL_TABLET | Freq: Every day | ORAL | Status: DC

## 2014-12-26 MED ORDER — FUROSEMIDE 80 MG PO TABS: 80.0000 mg | ORAL_TABLET | Freq: Two times a day (BID) | ORAL | Status: DC

## 2014-12-26 MED ORDER — HEPARIN SODIUM (PORCINE) 5000 UNIT/ML IJ SOLN
5000.0000 [IU] | Freq: Three times a day (TID) | INTRAMUSCULAR | Status: DC
  Administered 2014-12-26 – 2014-12-27 (×4): 5000 [IU] via SUBCUTANEOUS
  Filled 2014-12-26 (×6): qty 1

## 2014-12-26 MED ORDER — FUROSEMIDE 10 MG/ML IJ SOLN: 80.0000 mg | Freq: Two times a day (BID) | INTRAMUSCULAR | Status: DC

## 2014-12-26 MED ORDER — SODIUM CHLORIDE 0.9 % IV SOLN
510.0000 mg | Freq: Once | INTRAVENOUS | Status: AC
  Administered 2014-12-26: 510 mg via INTRAVENOUS
  Filled 2014-12-26: qty 17

## 2014-12-26 MED ORDER — COLCHICINE 0.6 MG PO TABS
1.2000 mg | ORAL_TABLET | Freq: Once | ORAL | Status: AC
  Administered 2014-12-26: 1.2 mg via ORAL
  Filled 2014-12-26: qty 2

## 2014-12-26 MED ORDER — POTASSIUM CHLORIDE CRYS ER 10 MEQ PO TBCR
10.0000 meq | EXTENDED_RELEASE_TABLET | Freq: Once | ORAL | Status: AC
  Administered 2014-12-26: 10 meq via ORAL
  Filled 2014-12-26: qty 1

## 2014-12-26 MED ORDER — FUROSEMIDE 10 MG/ML IJ SOLN
80.0000 mg | Freq: Three times a day (TID) | INTRAMUSCULAR | Status: DC
  Administered 2014-12-26 – 2014-12-27 (×3): 80 mg via INTRAVENOUS
  Filled 2014-12-26 (×3): qty 8

## 2014-12-26 NOTE — Progress Notes (Signed)
OT Cancellation Note  Patient Details Name: [name] MRN: [id number] DOB: [birth year]   Cancelled Treatment:    Reason Eval/Treat Not Completed: Other (comment). Pt just back to bed with nursing and getting ready to eat her lunch. Will try back later today or tomorrow as schedule allows.   Almon Register [phone] 12/26/2014, 1:31 PM

## 2014-12-26 NOTE — Progress Notes (Signed)
Advanced Heart Failure Rounding Note  Pulmonary: Dr Lamonte Sakai  HF: Ha Placeres  Subjective:    Feels good today. Closer to baseline.  Only complaint this am is bilateral leg pain.  Her granddaughter lives with her but goes to school during the day so does not have 24 hr supervision.  She says she will not go to a SNF.   Out ~1 L (says she urinated well and weight down so not sure I/Os accurate) and down 5 lbs on IV lasix 80 mg q8 hrs. Cr improved. Nearing baseline weight of 142. (147 today)  Objective:   Weight Range: 147 lb 8 oz (66.906 kg) Body mass index is 25.31 kg/(m^2).   Vital Signs:   Temp:  [97.9 F (36.6 C)-98.4 F (36.9 C)] 97.9 F (36.6 C) (10/13 0552) Pulse Rate:  [76-82] 80 (10/13 0552) Resp:  [16-18] 16 (10/13 0552) BP: (93-110)/(50-60) 93/51 mmHg (10/13 0552) SpO2:  [95 %-100 %] 95 % (10/13 0552) Weight:  [147 lb 8 oz (66.906 kg)] 147 lb 8 oz (66.906 kg) (10/13 0552) Last BM Date: 12/24/14  Weight change: Filed Weights   12/24/14 0546 12/25/14 0539 12/26/14 0552  Weight: 156 lb 1.6 oz (70.806 kg) 152 lb 8 oz (69.174 kg) 147 lb 8 oz (66.906 kg)    Intake/Output:   Intake/Output Summary (Last 24 hours) at 12/26/14 0752 Last data filed at 12/26/14 0651  Gross per 24 hour  Intake    660 ml  Output   1650 ml  Net   -990 ml     Physical Exam: General: Elderly and fatigued. Neck: JVP 12 cm, no thyromegaly or thyroid nodule.  Lungs: Diminished bibasilar with slight crackles CV: RV heave. Heart regular S1/S2 with widely split S2, 2/6 HSM LLSB. 1+ LE edema to knees bilaterally Abdomen: no HSM. Soft, mildly distended, non tender. Neurologic: Alert and oriented x 3.  Psych: Affect flat. Extremities: No clubbing or cyanosis.   Telemetry:  NSR 70s  Labs: CBC  Recent Labs  12/24/14 0500 12/25/14 0317  WBC 5.7 5.8  HGB 7.2* 7.3*  HCT 23.3* 23.5*  MCV 67.0* 67.1*  PLT 321 195   Basic Metabolic Panel  Recent Labs  12/25/14 0317 12/26/14 0333  NA  136 133*  K 4.2 3.7  CL 97* 96*  CO2 24 26  GLUCOSE 77 90  BUN 30* 26*  CREATININE 1.81* 1.70*  CALCIUM 8.8* 8.8*   Liver Function Tests  Recent Labs  12/23/14 1225  AST 23  ALT 8*  ALKPHOS 98  BILITOT 0.7  PROT 7.4  ALBUMIN 2.6*   No results for input(s): LIPASE, AMYLASE in the last 72 hours. Cardiac Enzymes No results for input(s): CKTOTAL, CKMB, CKMBINDEX, TROPONINI in the last 72 hours.  BNP: BNP (last 3 results)  Recent Labs  05/02/14 1022 11/05/14 1230 12/23/14 1226  BNP 670.3* 551.7* 570.5*    ProBNP (last 3 results)  Recent Labs  12/26/13 1210  PROBNP 1194*     D-Dimer No results for input(s): DDIMER in the last 72 hours. Hemoglobin A1C No results for input(s): HGBA1C in the last 72 hours. Fasting Lipid Panel No results for input(s): CHOL, HDL, LDLCALC, TRIG, CHOLHDL, LDLDIRECT in the last 72 hours. Thyroid Function Tests  Recent Labs  12/26/14 0333  TSH 8.492*    Other results:     Imaging/Studies:  Dg Chest 2 View  12/24/2014  CLINICAL DATA:  Weakness today, history of acute and chronic CHF, diabetes, scleroderma, chronic renal insufficiency EXAM:  CHEST  2 VIEW COMPARISON:  Portable chest x-ray of May 03, 2014 FINDINGS: The cardiopericardial silhouette remains enlarged. The pulmonary vascularity is engorged. The pulmonary interstitial markings are increased and are more conspicuous than in the past. The hemidiaphragms remain visible. The observed bony thorax exhibits no acute abnormality. IMPRESSION: CHF with mild pulmonary vascular congestion and pulmonary interstitial edema. There is no alveolar edema, pneumonia, or pleural effusion. Electronically Signed   By: David  Martinique M.D.   On: 12/24/2014 09:31   US Abdomen Limited  12/24/2014  CLINICAL DATA:  Abdominal distension evaluate for possible ascites, history of congestive heart failure EXAM: LIMITED ABDOMEN ULTRASOUND FOR ASCITES TECHNIQUE: Limited ultrasound survey for  ascites was performed in all four abdominal quadrants. COMPARISON:  None. FINDINGS: Four quadrant evaluation of the abdomen shows no ascites IMPRESSION: No ascites Electronically Signed   By: Skipper Cliche M.D.   On: 12/24/2014 09:22    Latest Echo  Latest Cath   Medications:    Scheduled Meds: . sodium chloride   Intravenous Once  . ambrisentan  10 mg Oral Daily  . amiodarone  200 mg Oral Daily  . [START ON 12/27/2014] colchicine  0.6 mg Oral Daily  . fenofibrate  54 mg Oral Daily  . ferrous sulfate  325 mg Oral Q breakfast  . ferumoxytol  510 mg Intravenous Once  . FLUoxetine  20 mg Oral q morning - 10a  . furosemide  80 mg Intravenous 3 times per day  . heparin subcutaneous  5,000 Units Subcutaneous 3 times per day  . nortriptyline  20 mg Oral QHS  . pantoprazole  40 mg Oral Daily  . potassium chloride  20 mEq Oral BID  . pregabalin  150 mg Oral BID  . Selexipag  1,600 mcg Oral BID  . sodium chloride  3 mL Intravenous Q12H  . Tadalafil (PAH)  40 mg Oral Daily   Continuous Infusions:  PRN Meds:.sodium chloride, acetaminophen, HYDROcodone-acetaminophen, hydrOXYzine, ondansetron (ZOFRAN) IV, promethazine, sodium chloride    Assessment/Plan  Ms. Scheller is a 64 yo female with a history of DM2, HLD, HTN, pHTN, GERD, gastroparesis, scleroderma, DJD and diastolic HF admitted from the HF clinic on 12/23/14 with marked volume overload.  Admission labs were significant for severe anemia. GI consulted and 2x UPRBCs given.   1. Acute on Chronic Diastolic HF: RV dysfunction 2/2 to severe PAH in the setting of scleroderma. Echo 12/23/14 withEF 60-65%, moderately dilated RV, PASP 72, no pericardial effusion. NYHA class IIIb symptoms. Still volume overloaded but improving with diuresis.  - Probably one more day of IV diuresis.  When she goes home, previous home dose of Lasix likely OK, as pt had run out of her meds for over a week. - Supp K. 2. Marked Anemia:  GI following with hx of  GI bleed and duodenal AVMs. Does not appear to be actively bleeding, heme negative.  Likely chronic Fe deficiency, not taking Fe at home.   - CBC pending. Will give additional 510 mg IV feraheme today for total of 1 g, did not give yesterday. 2. PAH with cor pulmonale: Severe PAH. Had recent [place] in 1/16. Pulmonary pressure remains elevated by echo 12/23/14.  - Continue ambrisentan 10 mg daily.  - Continue selexipag 1600 mcg bid.  - Continue Adcirca 40 mg daily.  3. CKD III: creatinine improved today. - Will follow closely with IV diuresis 4. Hx of Pericardial effusion: Resolved after pericardiocentesis, not seen on echo 10/2014 or 12/2014. 5. Atrial fibrillation:  Paroxysmal. NSR today. - Continue amiodarone. - She is not anticoagulated due to h/o GI bleeding. TSH elevated at 8.788, send free T3 and T4. LFTs OK. She will need a yearly eye exam while on amiodarone. 6. Leg pain - Uric acid elevated, new complaint with diuresis. Will give colchicine for acute gout.  - Pain is somewhat diffuse but calves seem tender.  Will order lower extremity venous US to r/o DVT.  She will get DVT prophylaxis heparin as I suspect that she does not have active GI bleeding.  7. Dispo;  PT recommends home health PT if 24 hour supervision available, SNF if not.   She says she will NOT go to a SNF. Encouraged to consider. Will place [place] orders.  Length of Stay: 3  Shirley Friar PA-C 12/26/2014, 7:52 AM  Advanced Heart Failure Team Pager [phone] (M-F; 7a - 4p)  Please contact [facility] for night-coverage after hours (4p -7a ) and weekends on [email or web address]  Patient seen with PA, agree with the above note.   Still volume overloaded but weight coming down nicely.  Continue IV Lasix today, possibly to po tomorrow.  Creatinine stable.   Leg pain => uric acid high, can see if colchicine helps.  Will get [place] to rule out DVT.  As she does not appear to be actively bleeding, think we can give heparin DVT  prophylaxis dose.    Will give IV Fe again today, awaiting CBC.   [name] 12/26/2014

## 2014-12-26 NOTE — Care Management Note (Signed)
Case Management Note  Patient Details  Name: [name] MRN: [id number] Date of Birth: 06-25-1950  Subjective/Objective:          Admitted with CHF        Action/Plan: Patient lives at home with her 63 yr old granddaughter, she also stated that she has family members close by to help her as needed. Pt has [facility] with prescription drug coverage, no problem getting her medication. HHC choice offered, patient chose [facility]. Tiffany with [facility] called for arrangements. Expected Discharge Date:     Possibly 12/27/2014             Expected Discharge Plan:  [place]  In-House Referral:  NA  Discharge planning Services  CM Consult  Post Acute Care Choice:    Choice offered to:  Patient  DME Arranged:    DME Agency:  [place] Arranged:  RN, Disease Management [place] Agency:   [facility]  Status of Service:  In process, will continue to follow  Sherrilyn Rist [phone] 12/26/2014, 10:14 AM

## 2014-12-26 NOTE — Progress Notes (Signed)
VASCULAR LAB PRELIMINARY  PRELIMINARY  PRELIMINARY  PRELIMINARY  Bilateral lower extremity venous duplex completed.    Preliminary report:  Bilateral:  No evidence of DVT, superficial thrombosis, or Baker's Cyst.   Neville Walston, RVS 12/26/2014, 1:32 PM

## 2014-12-26 NOTE — Care Management Important Message (Signed)
Important Message  Patient Details  Name: [name] MRN: [id number] Date of Birth: 06-14-1950   Medicare Important Message Given:  Yes-second notification given    Nathen May 12/26/2014, 10:43 AM

## 2014-12-27 LAB — CBC WITH DIFFERENTIAL/PLATELET
BASOS ABS: 0 10*3/uL (ref 0.0–0.1)
BASOS PCT: 0 %
EOS ABS: 0.1 10*3/uL (ref 0.0–0.7)
Eosinophils Relative: 2 %
HCT: 25.1 % — ABNORMAL LOW (ref 36.0–46.0)
Hemoglobin: 7.9 g/dL — ABNORMAL LOW (ref 12.0–15.0)
LYMPHS PCT: 27 %
Lymphs Abs: 1.8 10*3/uL (ref 0.7–4.0)
MCH: 21.3 pg — AB (ref 26.0–34.0)
MCHC: 31.5 g/dL (ref 30.0–36.0)
MCV: 67.7 fL — ABNORMAL LOW (ref 78.0–100.0)
MONO ABS: 0.7 10*3/uL (ref 0.1–1.0)
Monocytes Relative: 11 %
NEUTROS PCT: 60 %
Neutro Abs: 4 10*3/uL (ref 1.7–7.7)
PLATELETS: 286 10*3/uL (ref 150–400)
RBC: 3.71 MIL/uL — ABNORMAL LOW (ref 3.87–5.11)
RDW: 27.5 % — ABNORMAL HIGH (ref 11.5–15.5)
WBC: 6.6 10*3/uL (ref 4.0–10.5)

## 2014-12-27 LAB — TYPE AND SCREEN
ABO/RH(D): O NEG
Antibody Screen: NEGATIVE
DONOR AG TYPE: NEGATIVE
Donor AG Type: NEGATIVE
UNIT DIVISION: 0
Unit division: 0
Unit division: 0

## 2014-12-27 LAB — BASIC METABOLIC PANEL
ANION GAP: 12 (ref 5–15)
BUN: 29 mg/dL — ABNORMAL HIGH (ref 6–20)
CALCIUM: 8.8 mg/dL — AB (ref 8.9–10.3)
CO2: 25 mmol/L (ref 22–32)
CREATININE: 1.82 mg/dL — AB (ref 0.44–1.00)
Chloride: 97 mmol/L — ABNORMAL LOW (ref 101–111)
GFR, EST AFRICAN AMERICAN: 33 mL/min — AB (ref >60)
GFR, EST NON AFRICAN AMERICAN: 28 mL/min — AB (ref >60)
Glucose, Bld: 80 mg/dL (ref 65–99)
Potassium: 3.9 mmol/L (ref 3.5–5.1)
SODIUM: 134 mmol/L — AB (ref 135–145)

## 2014-12-27 LAB — T3, FREE: T3 FREE: 1.8 pg/mL — AB (ref 2.0–4.4)

## 2014-12-27 MED ORDER — AMBRISENTAN 5 MG PO TABS: 10.0000 mg | ORAL_TABLET | Freq: Every day | ORAL | Status: DC

## 2014-12-27 MED ORDER — POTASSIUM CHLORIDE CRYS ER 20 MEQ PO TBCR
20.0000 meq | EXTENDED_RELEASE_TABLET | Freq: Once | ORAL | Status: AC
  Administered 2014-12-27: 20 meq via ORAL
  Filled 2014-12-27: qty 1

## 2014-12-27 MED ORDER — FUROSEMIDE 80 MG PO TABS: 80.0000 mg | ORAL_TABLET | Freq: Two times a day (BID) | ORAL | Status: DC

## 2014-12-27 MED ORDER — FUROSEMIDE 80 MG PO TABS
80.0000 mg | ORAL_TABLET | Freq: Two times a day (BID) | ORAL | Status: DC
  Administered 2014-12-27 – 2014-12-28 (×2): 80 mg via ORAL
  Filled 2014-12-27 (×2): qty 1

## 2014-12-27 MED ORDER — LEVOTHYROXINE SODIUM 25 MCG PO TABS
25.0000 ug | ORAL_TABLET | Freq: Every day | ORAL | Status: DC
  Administered 2014-12-27 – 2014-12-28 (×2): 25 ug via ORAL
  Filled 2014-12-27 (×2): qty 1

## 2014-12-27 MED ORDER — COLCHICINE 0.6 MG PO TABS
0.3000 mg | ORAL_TABLET | Freq: Every day | ORAL | Status: DC
  Administered 2014-12-28: 0.3 mg via ORAL
  Filled 2014-12-27: qty 1

## 2014-12-27 MED ORDER — AMBRISENTAN 10 MG PO TABS
10.0000 mg | ORAL_TABLET | Freq: Every day | ORAL | Status: DC
  Administered 2014-12-28: 10 mg via ORAL
  Filled 2014-12-27: qty 1

## 2014-12-27 MED ORDER — POTASSIUM CHLORIDE CRYS ER 20 MEQ PO TBCR
20.0000 meq | EXTENDED_RELEASE_TABLET | Freq: Every day | ORAL | Status: DC
  Administered 2014-12-27 – 2014-12-28 (×2): 20 meq via ORAL
  Filled 2014-12-27 (×2): qty 1

## 2014-12-27 MED ORDER — AMBRISENTAN 10 MG PO TABS: 10.0000 mg | ORAL_TABLET | Freq: Every day | ORAL | Status: DC

## 2014-12-27 MED FILL — Ambrisentan Tab 10 MG: ORAL | Qty: 1 | Status: AC

## 2014-12-27 NOTE — Evaluation (Signed)
Occupational Therapy Evaluation Patient Details Name: [name] MRN: [id number] DOB: 19-Dec-1950 Today's Date: 12/27/2014    History of Present Illness Ms. Freimark is a 64 yo female with a history of DM2, HLD, HTN, pHTN, GERD, gastroparesis, scleroderma, DJD and diastolic HF.   Admitted with acute on chronic CHF.  Also with PAH with cor pulmonale   Clinical Impression   Pt admitted with CHF.  Pt currently with functional limitations due to the deficits listed below (see OT Problem List).  Pt will benefit from skilled OT to increase their safety and independence with ADL and functional mobility for ADL to facilitate discharge to venue listed below.      Follow Up Recommendations  Home health OT    Equipment Recommendations  None recommended by OT       Precautions / Restrictions Precautions Precautions: Fall      Mobility Bed Mobility Overal bed mobility: Independent       Supine to sit: Independent     General bed mobility comments: pt inchair  Transfers Overall transfer level: Needs assistance Equipment used: None Transfers: Sit to/from Stand Sit to Stand: Min guard         General transfer comment: No assist needed.      Balance Overall balance assessment: Needs assistance         Standing balance support: No upper extremity supported;During functional activity Standing balance-Leahy Scale: Fair Standing balance comment: can stand statically without UE support. Cannot withstand challenges to balance.               High level balance activites: Direction changes;Turns;Head turns;Sudden stops High Level Balance Comments: All of above with supervision            ADL Overall ADL's : Needs assistance/impaired Eating/Feeding: Set up;Sitting   Grooming: Set up;Sitting   Upper Body Bathing: Set up;Sitting   Lower Body Bathing: Sit to/from stand;Minimal assistance       Lower Body Dressing: Minimal assistance;Sit to/from stand   Toilet  Transfer: Min guard;BSC   Toileting- Water quality scientist and Hygiene: Min guard;Sit to/from stand       Functional mobility during ADLs: Min guard;Cueing for safety General ADL Comments: pt adament about going home but is agreeable to [facility]               Pertinent Vitals/Pain Pain Assessment: No/denies pain     Hand Dominance     Extremity/Trunk Assessment Upper Extremity Assessment Upper Extremity Assessment: Generalized weakness           Communication Communication Communication: No difficulties   Cognition Arousal/Alertness: Awake/alert Behavior During Therapy: Flat affect Overall Cognitive Status: Within Functional Limits for tasks assessed                                Home Living Family/patient expects to be discharged to:: Private residence Living Arrangements: Alone Available Help at Discharge: Family;Available PRN/intermittently Type of Home: Apartment Home Access: Level entry     Home Layout: One level     Bathroom Shower/Tub: Tub/shower unit         Home Equipment: Environmental consultant - 2 wheels;Kasandra Knudsen - single point   Additional Comments: reports grandaughter goes to school. multile other family members, but doubt able to have 24/7 assist      Prior Functioning/Environment Level of Independence: Independent             OT Diagnosis: Generalized weakness  OT Problem List: Decreased strength;Decreased activity tolerance   OT Treatment/Interventions: Self-care/ADL training;DME and/or AE instruction;Patient/family education    OT Goals(Current goals can be found in the care plan section) Acute Rehab OT Goals Patient Stated Goal: To walk without pain OT Goal Formulation: With patient Time For Goal Achievement: 01/10/15  OT Frequency: Min 2X/week   Barriers to D/C: Decreased caregiver support             End of Session Nurse Communication: Mobility status  Activity Tolerance: Patient tolerated treatment well Patient left:      Time: 1047-1100 OT Time Calculation (min): 13 min Charges:  OT General Charges $OT Visit: 1 Procedure OT Evaluation $Initial OT Evaluation Tier I: 1 Procedure G-Codes:    Payton Mccallum D January 12, 2015, 11:13 AM

## 2014-12-27 NOTE — Progress Notes (Signed)
Physical Therapy Treatment Patient Details Name: [name] MRN: [id number] DOB: December 14, 1950 Today's Date: 12/27/2014    History of Present Illness Ms. Jaffer is a 64 yo female with a history of DM2, HLD, HTN, pHTN, GERD, gastroparesis, scleroderma, DJD and diastolic HF.   Admitted with acute on chronic CHF.  Also with PAH with cor pulmonale    PT Comments    Pt admitted with above diagnosis. Pt currently with functional limitations due to balance and endurance deficits. Pt able to ambulate without device with good balance overall.  Did not challenge pt too much but pt states she will use RW prn at home.  Feel she will be ok to go home with [facility] f/u and use of RW.  Pt will benefit from skilled PT to increase their independence and safety with mobility to allow discharge to the venue listed below.    Follow Up Recommendations  Home health PT;Supervision/Assistance - 24 hour (HHOT)     Equipment Recommendations  None recommended by PT    Recommendations for Other Services       Precautions / Restrictions Precautions Precautions: Fall    Mobility  Bed Mobility Overal bed mobility: Independent       Supine to sit: Independent        Transfers Overall transfer level: Needs assistance Equipment used: None Transfers: Sit to/from Stand Sit to Stand: Supervision         General transfer comment: No assist needed.    Ambulation/Gait Ambulation/Gait assistance: Min guard Ambulation Distance (Feet): 100 Feet Assistive device: None Gait Pattern/deviations: Step-to pattern;Trunk flexed;Wide base of support   Gait velocity interpretation: Below normal speed for age/gender General Gait Details: Pt without LOB however no challenges given to balance.  Pt overall appears failry steady for ambulation in controlled environment.  Pt agrees to use RW prn at home and appears to have awareness to do so.    Stairs            Wheelchair Mobility    Modified Rankin (Stroke  Patients Only)       Balance Overall balance assessment: Needs assistance         Standing balance support: No upper extremity supported;During functional activity Standing balance-Leahy Scale: Fair Standing balance comment: can stand statically without UE support. Cannot withstand challenges to balance.               High level balance activites: Direction changes;Turns;Head turns;Sudden stops High Level Balance Comments: All of above with supervision    Cognition Arousal/Alertness: Awake/alert Behavior During Therapy: Flat affect Overall Cognitive Status: Within Functional Limits for tasks assessed                      Exercises  Ankle Circles/Pumps: AROM;5 reps;Both;Supine    General Comments General comments (skin integrity, edema, etc.): Discussed with pt that she needs to use RW at home initially. Pt somewhat difficult to talk to.  She doesnt' answer questions readily and seems to withhold information.   Pt adamant however that she is going home and that she will not go anywhere for therapy.  Is agreeable to HHPT and HHOT.        Pertinent Vitals/Pain Pain Assessment: No/denies pain  VSS with sats >90% on Ra.  Other VSS.    Home Living Family/patient expects to be discharged to:: Private residence Living Arrangements: Alone Available Help at Discharge: Family;Available PRN/intermittently Type of Home: Apartment Home Access: Level entry   Home  Layout: One level Home Equipment: Walker - 2 wheels;Kasandra Knudsen - single point Additional Comments: reports grandaughter goes to school. multile other family members, but doubt able to have 24/7 assist    Prior Function Level of Independence: Independent          PT Goals (current goals can now be found in the care plan section) Progress towards PT goals: Progressing toward goals    Frequency  Min 3X/week    PT Plan Current plan remains appropriate    Co-evaluation             End of Session  Equipment Utilized During Treatment: Gait belt Activity Tolerance: Patient limited by fatigue Patient left: in chair;with call bell/phone within reach;with chair alarm set;with nursing/sitter in room     Time: 2025-4270 PT Time Calculation (min) (ACUTE ONLY): 12 min  Charges:  $Gait Training: 8-22 mins                    G CodesIrwin Brakeman F Jan 15, 2015, 11:09 AM [facility] [phone] (pager)

## 2014-12-27 NOTE — Progress Notes (Signed)
Advanced Heart Failure Rounding Note  Pulmonary: Dr Lamonte Sakai  HF: [name]  Subjective:    Legs feel better today.  No complaints today.   Out 2.4 L and down 4 lbs on IV lasix 80 mg q8 hrs. Cr up slightly. At baseline weight.  Objective:   Weight Range: 143 lb 11.2 oz (65.182 kg) Body mass index is 24.65 kg/(m^2).   Vital Signs:   Temp:  [97.9 F (36.6 C)-98.2 F (36.8 C)] 97.9 F (36.6 C) (10/14 0450) Pulse Rate:  [77-85] 77 (10/14 0450) Resp:  [20] 20 (10/14 0450) BP: (100-113)/(52-54) 100/52 mmHg (10/14 0450) SpO2:  [95 %-96 %] 95 % (10/14 0450) Weight:  [143 lb 11.2 oz (65.182 kg)] 143 lb 11.2 oz (65.182 kg) (10/14 0450) Last BM Date: 12/26/14  Weight change: Filed Weights   12/25/14 0539 12/26/14 0552 12/27/14 0450  Weight: 152 lb 8 oz (69.174 kg) 147 lb 8 oz (66.906 kg) 143 lb 11.2 oz (65.182 kg)    Intake/Output:   Intake/Output Summary (Last 24 hours) at 12/27/14 0740 Last data filed at 12/27/14 0600  Gross per 24 hour  Intake    300 ml  Output   2725 ml  Net  -2425 ml     Physical Exam: General: Elderly, NAD Neck: JVP 12 cm, no thyromegaly or thyroid nodule.  Lungs: Diminished bibasilar L>R CV: RV heave. Heart regular S1/S2 with widely split S2, 2/6 HSM LLSB. Trace LE edema Abdomen: no HSM. Soft, mildly distended, NT. Neurologic: Alert and oriented x 3.  Psych: Affect pleasant. Extremities: No clubbing or cyanosis.   Telemetry:  NSR 70s  Labs: CBC  Recent Labs  12/26/14 0909 12/27/14 0348  WBC 5.4 6.6  NEUTROABS 3.4 PENDING  HGB 8.1* 7.9*  HCT 26.3* 25.1*  MCV 67.3* 67.7*  PLT 289 PENDING   Basic Metabolic Panel  Recent Labs  12/26/14 0333 12/27/14 0348  NA 133* 134*  K 3.7 3.9  CL 96* 97*  CO2 26 25  GLUCOSE 90 80  BUN 26* 29*  CREATININE 1.70* 1.82*  CALCIUM 8.8* 8.8*   Liver Function Tests No results for input(s): AST, ALT, ALKPHOS, BILITOT, PROT, ALBUMIN in the last 72 hours. No results for input(s): LIPASE, AMYLASE  in the last 72 hours. Cardiac Enzymes No results for input(s): CKTOTAL, CKMB, CKMBINDEX, TROPONINI in the last 72 hours.  BNP: BNP (last 3 results)  Recent Labs  05/02/14 1022 11/05/14 1230 12/23/14 1226  BNP 670.3* 551.7* 570.5*     Thyroid Function Tests  Recent Labs  12/26/14 0333  TSH 8.492*  T3FREE 1.8*   Imaging/Studies:  No results found.    Medications:    Scheduled Meds: . sodium chloride   Intravenous Once  . ambrisentan  10 mg Oral Daily  . amiodarone  200 mg Oral Daily  . colchicine  0.6 mg Oral Daily  . fenofibrate  54 mg Oral Daily  . ferrous sulfate  325 mg Oral Q breakfast  . FLUoxetine  20 mg Oral q morning - 10a  . furosemide  80 mg Intravenous 3 times per day  . heparin subcutaneous  5,000 Units Subcutaneous 3 times per day  . nortriptyline  20 mg Oral QHS  . pantoprazole  40 mg Oral Daily  . potassium chloride  20 mEq Oral BID  . pregabalin  150 mg Oral BID  . Selexipag  1,600 mcg Oral BID  . sodium chloride  3 mL Intravenous Q12H  . Tadalafil (PAH)  40 mg  Oral Daily   Continuous Infusions:  PRN Meds:.sodium chloride, acetaminophen, HYDROcodone-acetaminophen, hydrOXYzine, ondansetron (ZOFRAN) IV, promethazine, sodium chloride    Assessment/Plan  [name] is a 64 yo female with a history of DM2, HLD, HTN, pHTN, GERD, gastroparesis, scleroderma, DJD and diastolic HF admitted from the HF clinic on 12/23/14 with marked volume overload.  Admission labs were significant for severe anemia. GI consulted and 2x UPRBCs given.    1. Acute on Chronic Diastolic HF: RV dysfunction 2/2 to severe PAH in the setting of scleroderma. Echo 12/23/14 withEF 60-65%, moderately dilated RV, PASP 72, no pericardial effusion. NYHA class IIIb symptoms.  - Volume status improved, still slightly overloaded on exam.  - Had one dose 80 mg IV lasix this morning. If pees well, can switch to po lasix this pm.  OK to remain on previous dose as she was off for > a  week. - Supp K.  2. Marked Anemia:  GI following with hx of GI bleed and duodenal AVMs. Does not appear to be actively bleeding, heme negative.  Likely chronic Fe deficiency, not taking Fe at home.   - Hgb stable. 2. PAH with cor pulmonale: Severe PAH. Had recent [place] in 1/16. Pulmonary pressure remains elevated by echo 12/23/14.  - Continue ambrisentan 10 mg daily.  - Continue selexipag 1600 mcg bid.  - Continue Adcirca 40 mg daily.  3. CKD III: creatinine improved today. - Will follow closely with IV diuresis 4. Hx of Pericardial effusion: Resolved after pericardiocentesis, not seen on echo 10/2014 or 12/2014. 5. Atrial fibrillation: Paroxysmal. NSR today. - Continue amiodarone. - She is not anticoagulated due to h/o GI bleeding. TSH elevated, free T3 just below normal. T4 normal. LFTs OK. She will need a yearly eye exam while on amiodarone. 6. Leg pain - Uric acid elevated, new complaint with diuresis. Continue colchicine for acute gout. Will send home on allopurinol - Pain is somewhat diffuse but calves seem tender. Lower extremity venous US negative  7. Dispo;  PT recommends home health PT if 24 hour supervision available, SNF if not.   She says she will NOT go to a SNF. Encouraged to consider. Will place [place] orders.  Dispo: Home today vs tomorrow with HH.   Length of Stay: 4  Shirley Friar PA-C 12/27/2014, 7:40 AM  Advanced Heart Failure Team Pager [phone] (M-F; 7a - 4p)  Please contact [facility] for night-coverage after hours (4p -7a ) and weekends on [email or web address]  Patient seen with PA, agree with above note.  Volume status improved, weight down another 4 lbs.  Now around her baseline.  Think we can switch back to Lasix 80 mg po bid + KCl 20 today.  This had been her home regimen but she had been off it for a while, likely triggering exacerbation.   Still complains of leg pain.  [place] negative for DVT.  Treating for gout.  Need to try to mobilize.  Refuses rehab,  will need home PT/home health.  Will try to get her up walking today, aim for home in am.   [name] 12/27/2014 9:41 AM

## 2014-12-28 ENCOUNTER — Encounter (HOSPITAL_COMMUNITY): Payer: Self-pay | Admitting: Physician Assistant

## 2014-12-28 DIAGNOSIS — E039 Hypothyroidism, unspecified: Secondary | ICD-10-CM | POA: Diagnosis present

## 2014-12-28 DIAGNOSIS — I480 Paroxysmal atrial fibrillation: Secondary | ICD-10-CM | POA: Diagnosis present

## 2014-12-28 DIAGNOSIS — I5032 Chronic diastolic (congestive) heart failure: Secondary | ICD-10-CM | POA: Diagnosis present

## 2014-12-28 DIAGNOSIS — M109 Gout, unspecified: Secondary | ICD-10-CM | POA: Diagnosis present

## 2014-12-28 DIAGNOSIS — N189 Chronic kidney disease, unspecified: Secondary | ICD-10-CM | POA: Diagnosis present

## 2014-12-28 LAB — BASIC METABOLIC PANEL
ANION GAP: 10 (ref 5–15)
BUN: 33 mg/dL — ABNORMAL HIGH (ref 6–20)
CHLORIDE: 101 mmol/L (ref 101–111)
CO2: 25 mmol/L (ref 22–32)
Calcium: 9.2 mg/dL (ref 8.9–10.3)
Creatinine, Ser: 1.77 mg/dL — ABNORMAL HIGH (ref 0.44–1.00)
GFR calc non Af Amer: 29 mL/min — ABNORMAL LOW (ref >60)
GFR, EST AFRICAN AMERICAN: 34 mL/min — AB (ref >60)
Glucose, Bld: 86 mg/dL (ref 65–99)
POTASSIUM: 4.4 mmol/L (ref 3.5–5.1)
Sodium: 136 mmol/L (ref 135–145)

## 2014-12-28 MED ORDER — LEVOTHYROXINE SODIUM 25 MCG PO TABS
25.0000 ug | ORAL_TABLET | Freq: Every day | ORAL | Status: DC
Start: 2014-12-28 — End: 2015-10-28

## 2014-12-28 MED ORDER — FUROSEMIDE 80 MG PO TABS: 80.0000 mg | ORAL_TABLET | Freq: Two times a day (BID) | ORAL | Status: DC

## 2014-12-28 MED ORDER — COLCHICINE 0.6 MG PO TABS: 0.3000 mg | ORAL_TABLET | Freq: Every day | ORAL | Status: DC

## 2014-12-28 MED ORDER — POTASSIUM CHLORIDE CRYS ER 20 MEQ PO TBCR: 20.0000 meq | EXTENDED_RELEASE_TABLET | Freq: Every day | ORAL | Status: DC

## 2014-12-28 NOTE — Progress Notes (Signed)
Patient ID: [name], female   DOB: 1951-01-10, 64 y.o.   MRN: [id number]  Advanced Heart Failure Rounding Note  Pulmonary: Dr Lamonte Sakai  HF: [name]  Subjective:    Legs feel better today.  No complaints today.  She walked better with PT yesterday.   Stable on po Lasix, creatinine down.  Weight down 2 more lbs.   Objective:   Weight Range: 141 lb 3.2 oz (64.048 kg) Body mass index is 24.23 kg/(m^2).   Vital Signs:   Temp:  [97.8 F (36.6 C)-98.6 F (37 C)] 97.8 F (36.6 C) (10/15 0949) Pulse Rate:  [78-81] 80 (10/15 0535) Resp:  [18] 18 (10/15 0535) BP: (98-104)/(46-58) 104/50 mmHg (10/15 0949) SpO2:  [93 %-98 %] 93 % (10/15 0535) Weight:  [141 lb 3.2 oz (64.048 kg)] 141 lb 3.2 oz (64.048 kg) (10/15 0535) Last BM Date: 12/27/14  Weight change: Filed Weights   12/26/14 0552 12/27/14 0450 12/28/14 0535  Weight: 147 lb 8 oz (66.906 kg) 143 lb 11.2 oz (65.182 kg) 141 lb 3.2 oz (64.048 kg)    Intake/Output:   Intake/Output Summary (Last 24 hours) at 12/28/14 1012 Last data filed at 12/28/14 0856  Gross per 24 hour  Intake   1203 ml  Output    700 ml  Net    503 ml     Physical Exam: General: Elderly, NAD Neck: JVP 8 cm, no thyromegaly or thyroid nodule.  Lungs: Diminished bibasilar L>R CV: RV heave. Heart regular S1/S2 with widely split S2, 2/6 HSM LLSB. Trace LE edema Abdomen: no HSM. Soft, mildly distended, NT. Neurologic: Alert and oriented x 3.  Psych: Affect pleasant. Extremities: No clubbing or cyanosis.   Telemetry:  NSR 70s  Labs: CBC  Recent Labs  12/26/14 0909 12/27/14 0348  WBC 5.4 6.6  NEUTROABS 3.4 4.0  HGB 8.1* 7.9*  HCT 26.3* 25.1*  MCV 67.3* 67.7*  PLT 289 497   Basic Metabolic Panel  Recent Labs  12/27/14 0348 12/28/14 0503  NA 134* 136  K 3.9 4.4  CL 97* 101  CO2 25 25  GLUCOSE 80 86  BUN 29* 33*  CREATININE 1.82* 1.77*  CALCIUM 8.8* 9.2   Liver Function Tests No results for input(s): AST, ALT, ALKPHOS, BILITOT,  PROT, ALBUMIN in the last 72 hours. No results for input(s): LIPASE, AMYLASE in the last 72 hours. Cardiac Enzymes No results for input(s): CKTOTAL, CKMB, CKMBINDEX, TROPONINI in the last 72 hours.  BNP: BNP (last 3 results)  Recent Labs  05/02/14 1022 11/05/14 1230 12/23/14 1226  BNP 670.3* 551.7* 570.5*     Thyroid Function Tests  Recent Labs  12/26/14 0333  TSH 8.492*  T3FREE 1.8*   Imaging/Studies:  No results found.    Medications:    Scheduled Meds: . sodium chloride   Intravenous Once  . ambrisentan  10 mg Oral Daily  . amiodarone  200 mg Oral Daily  . colchicine  0.3 mg Oral Daily  . fenofibrate  54 mg Oral Daily  . ferrous sulfate  325 mg Oral Q breakfast  . FLUoxetine  20 mg Oral q morning - 10a  . furosemide  80 mg Oral BID  . heparin subcutaneous  5,000 Units Subcutaneous 3 times per day  . levothyroxine  25 mcg Oral QAC breakfast  . nortriptyline  20 mg Oral QHS  . pantoprazole  40 mg Oral Daily  . potassium chloride  20 mEq Oral Daily  . pregabalin  150 mg Oral  BID  . Selexipag  1,600 mcg Oral BID  . sodium chloride  3 mL Intravenous Q12H  . Tadalafil (PAH)  40 mg Oral Daily   Continuous Infusions:  PRN Meds:.sodium chloride, acetaminophen, HYDROcodone-acetaminophen, hydrOXYzine, ondansetron (ZOFRAN) IV, promethazine, sodium chloride    Assessment/Plan  [name] is a 64 yo female with a history of DM2, HLD, HTN, pHTN, GERD, gastroparesis, scleroderma, DJD and diastolic HF admitted from the HF clinic on 12/23/14 with marked volume overload.  Admission labs were significant for severe anemia. GI consulted and 2x UPRBCs given. 1. Acute on chronic diastolic CHF: With prominent RV dysfunction 2/2 to severe PAH in the setting of scleroderma. Echo 12/23/14 withEF 60-65%, moderately dilated RV, PASP 72, no pericardial effusion. NYHA class IIIb symptoms at home.  Exacerbation seems to be due to running out of Lasix for a number of days.  She  diuresed well, now close to her baseline.  - She can go home on Lasix 80 mg po bid and KCl 20 daily.  Urged her to make sure that she does not run out of Lasix at home. 2. Marked Anemia: GI following with hx of GI bleed and duodenal AVMs. Does not appear to be actively bleeding, heme negative.  Likely chronic Fe deficiency, not taking Fe at home.   - Hgb stable but still low, got IV Fe in hospital.  Continue po Fe.  Again, needs to be compliant with this. 2. PAH with cor pulmonale: Severe PAH. Had recent [place] in 1/16. Pulmonary pressure remains elevated by echo 12/23/14.  - Continue ambrisentan 10 mg daily.  - Continue selexipag 1600 mcg bid.  - Continue Adcirca 40 mg daily.  3. CKD III: Stable creatinine this admission.  4. Hx of Pericardial effusion: Resolved after pericardiocentesis, not seen on echo 10/2014 or 12/2014. 5. Atrial fibrillation: Paroxysmal. NSR today. - Continue amiodarone. - She is not anticoagulated due to h/o GI bleeding. TSH elevated, free T3 just below normal. T4 normal. LFTs OK. She will need a yearly eye exam while on amiodarone. 6. Leg pain:  [place] negative for DVT.  Uric acid elevated, new complaint with diuresis. Continue colchicine for acute gout, can go home on once daily colchicine for 5 more days then prn.  7. Hypothyroidism: May be related to amiodarone.  Started low dose levothyroxine this admission.  8. Disposition: Home today.  Needs home health/PT.  Needs followup with CHF clinic in 1 week.  Home meds: Lasix 80 mg po bid, KCl 20 daily, ferrous sulfate 325 daily, ambrisentan/selexipag/Adcirca as prior to admission, levothyroxine 25 mcg daily, colchicine 0.3 daily x 5 days then prn.    Length of Stay: [place]  12/28/2014, 10:12 AM

## 2014-12-28 NOTE — Progress Notes (Signed)
Patient discharge to home at 1:15 pm accompanied by spouse and staff via wheelchair. Discharge instructions given. Patient verbalizes understanding. All personal belongings given. [name] stable . Denies any pain. No signs and symptoms of distress noted.

## 2014-12-28 NOTE — Discharge Summary (Signed)
Discharge Summary   Patient ID: [name] MRN: [id number], DOB/AGE: [birth year] 64 y.o. Admit date: 12/23/2014 D/C date:     63/15/2016  Primary Cardiologist: Dr. Aundra Dubin  Principal Problem:   Acute on chronic diastolic CHF (congestive heart failure), NYHA class 3 (HCC) Active Problems:   GERD   Gastroparesis   SCLERODERMA   Anemia   PAH (pulmonary artery hypertension) (HCC)   RVF (right ventricular failure) ([place])   CKD (chronic kidney disease), stage III   AVM (arteriovenous malformation) of duodenum, acquired with hemorrhage   Hypothyroidism   Gout   Chronic kidney disease   PAF (paroxysmal atrial fibrillation) (HCC)   Chronic diastolic CHF (congestive heart failure) ([place])    Admission Dates: 12/23/14-12/28/14 Discharge Diagnosis: Acute on chronic diastolic CHF s/p IV diuresis and anemia s/p 2UPRBCs  HPI: [name] is a 64 y.o. female with a history of DM2, HLD, HTN, pHTN, GERD, gastroparesis, scleroderma, DJD and diastolic CHF who was admitted from the CHF clinic on 12/23/14 with marked volume overload. Marland Kitchen  She was felt to be markedly volume overloaded in the office. She was out of her meds for about a week but had restarted them. Weight up 20 lbs. She was admitted for IV diuresis. Admission labs were significant for severe anemia. GI consulted and 2x UPRBCs given  Hospital Course  Acute on chronic diastolic CHF: With prominent RV dysfunction 2/2 to severe PAH in the setting of scleroderma. Echo 12/23/14 withEF 60-65%, moderately dilated RV, PASP 72, no pericardial effusion. NYHA class IIIb symptoms at home. Exacerbation seems to be due to running out of Lasix for a number of days. She diuresed well, now close to her baseline. Net neg 11.2L. Discharge weight 141lbs - She can go home on Lasix 80 mg po bid and KCl 20 daily. Urged her to make sure that she does not run out of Lasix at home.  Marked Anemia: GI following with hx of GI bleed and duodenal AVMs.  Does not appear to be actively bleeding, heme negative. Likely chronic Fe deficiency, not taking Fe at home.  - Hgb stable but still low, got IV Fe in hospital. Continue po Fe. Again, needs to be compliant with this.  -- H/H 7.9/25.1 at discharge  [place] with cor pulmonale: Severe PAH. Had recent [place] in 1/16. Pulmonary pressure remains elevated by echo 12/23/14.  - Continue ambrisentan 10 mg daily.  - Continue selexipag 1600 mcg bid.  - Continue Adcirca 40 mg daily.   CKD III: Stable creatinine this admission.   Hx of Pericardial effusion: Resolved after pericardiocentesis, not seen on echo 10/2014 or 12/2014.  Atrial fibrillation: Paroxysmal. NSR today. - Continue amiodarone. - She is not anticoagulated due to h/o GI bleeding. TSH elevated, free T3 just below normal. T4 normal. LFTs OK. She will need a yearly eye exam while on amiodarone.  Leg pain: [place] negative for DVT. Uric acid elevated, new complaint with diuresis. Continue colchicine for acute gout, can go home on once daily colchicine for 5 more days then prn.   Hypothyroidism: May be related to amiodarone. Started low dose levothyroxine this admission.   Disposition: Home today with home health RN/PT. Orders have been placed.  Home meds: Lasix 80 mg po bid, KCl 20 daily, ferrous sulfate 325 daily, ambrisentan/selexipag/Adcirca as prior to admission, levothyroxine 25 mcg daily, colchicine 0.3 daily x 5 days then prn.   The patient has had an uncomplicated hospital course and is recovering well. She has  been seen by Dr. Aundra Dubin today and deemed ready for discharge home. Follow up appointment already scheduled. Discharge medications are listed below.   Discharge Vitals: Blood pressure 104/50, pulse 80, temperature 97.8 F (36.6 C), temperature source Oral, resp. rate 18, height _0  (1.626 m), weight 141 lb 3.2 oz (64.048 kg), SpO2 93 %.  Labs: Lab Results  Component Value Date   WBC 6.6 12/27/2014   HGB 7.9*  12/27/2014   HCT 25.1* 12/27/2014   MCV 67.7* 12/27/2014   PLT 286 12/27/2014    Recent Labs Lab 12/23/14 1225  12/28/14 0503  NA 137  < > 136  K 3.3*  < > 4.4  CL 100*  < > 101  CO2 25  < > 25  BUN 29*  < > 33*  CREATININE 1.75*  < > 1.77*  CALCIUM 8.4*  < > 9.2  PROT 7.4  --   --   BILITOT 0.7  --   --   ALKPHOS 98  --   --   ALT 8*  --   --   AST 23  --   --   GLUCOSE 95  < > 86  < > = values in this interval not displayed. No results for input(s): CKTOTAL, CKMB, TROPONINI in the last 72 hours. Lab Results  Component Value Date   CHOL 98 04/16/2013   HDL 31* 04/16/2013   LDLCALC 48 04/16/2013   TRIG 93 04/16/2013   Lab Results  Component Value Date   DDIMER 2.35* 03/12/2014    Diagnostic Studies/Procedures   Dg Chest 2 View  12/24/2014  CLINICAL DATA:  Weakness today, history of acute and chronic CHF, diabetes, scleroderma, chronic renal insufficiency EXAM: CHEST  2 VIEW COMPARISON:  Portable chest x-ray of May 03, 2014 FINDINGS: The cardiopericardial silhouette remains enlarged. The pulmonary vascularity is engorged. The pulmonary interstitial markings are increased and are more conspicuous than in the past. The hemidiaphragms remain visible. The observed bony thorax exhibits no acute abnormality. IMPRESSION: CHF with mild pulmonary vascular congestion and pulmonary interstitial edema. There is no alveolar edema, pneumonia, or pleural effusion. Electronically Signed   By: David  Martinique M.D.   On: 12/24/2014 09:31   US Abdomen Limited  12/24/2014  CLINICAL DATA:  Abdominal distension evaluate for possible ascites, history of congestive heart failure EXAM: LIMITED ABDOMEN ULTRASOUND FOR ASCITES TECHNIQUE: Limited ultrasound survey for ascites was performed in all four abdominal quadrants. COMPARISON:  None. FINDINGS: Four quadrant evaluation of the abdomen shows no ascites IMPRESSION: No ascites Electronically Signed   By: Skipper Cliche M.D.   On: 12/24/2014 09:22      2D ECHO 12/23/2014 LV EF: 60% -  65% Study Conclusions - Left ventricle: The cavity size was normal. Wall thickness was normal. Systolic function was normal. The estimated ejection fraction was in the range of 60% to 65%. - Ventricular septum: The contour showed systolic flattening. - Aortic valve: There was mild regurgitation. - Left atrium: The atrium was mildly dilated. - Right ventricle: The cavity size was moderately dilated. - Right atrium: The atrium was severely dilated. - Tricuspid valve: There was severe regurgitation. - Pulmonary arteries: PA peak pressure: 76 mm Hg (S).   Discharge Medications     Medication List    TAKE these medications        ADCIRCA 20 MG Tabs  Generic drug:  Tadalafil (PAH)  TAKE 2 TABLETS (40MG) BY MOUTH ONCE DAILY WITH OR WITHOUT FOOD. CALL [phone] FOR  REFILLS.     ambrisentan 10 MG tablet  Commonly known as:  LETAIRIS  Take 1 tablet (10 mg total) by mouth daily.     amiodarone 200 MG tablet  Commonly known as:  PACERONE  Take 1 tablet (200 mg total) by mouth daily.     colchicine 0.6 MG tablet  Take 0.5 tablets (0.3 mg total) by mouth daily. For 5 more days. Then you can take as needed for gout flares.     [employer] IRON 325 (65 FE) MG tablet  Generic drug:  ferrous sulfate  Take 325 mg by mouth daily with breakfast.     esomeprazole 40 MG capsule  Commonly known as:  NEXIUM  Take 40 mg by mouth 2 (two) times daily.     fenofibrate 54 MG tablet  Take 1 tablet (54 mg total) by mouth daily.     FLUoxetine 20 MG capsule  Commonly known as:  PROZAC  take 1 capsule by mouth every morning     furosemide 80 MG tablet  Commonly known as:  LASIX  Take 1 tablet (80 mg total) by mouth 2 (two) times daily.     HYDROcodone-acetaminophen 10-325 MG tablet  Commonly known as:  NORCO  Take 1 tablet by mouth every 6 (six) hours as needed for moderate pain.     hydrOXYzine 10 MG tablet  Commonly known as:  ATARAX/VISTARIL    Take 1 tablet (10 mg total) by mouth 2 (two) times daily as needed for itching.     levothyroxine 25 MCG tablet  Commonly known as:  SYNTHROID, LEVOTHROID  Take 1 tablet (25 mcg total) by mouth daily before breakfast.     nortriptyline 10 MG capsule  Commonly known as:  PAMELOR  2 tabs po qhs     potassium chloride SA 20 MEQ tablet  Commonly known as:  K-DUR,KLOR-CON  Take 1 tablet (20 mEq total) by mouth daily.     pregabalin 150 MG capsule  Commonly known as:  LYRICA  Take 1 capsule (150 mg total) by mouth 3 (three) times daily.     promethazine 12.5 MG tablet  Commonly known as:  PHENERGAN  Take 1 tablet (12.5 mg total) by mouth every 6 (six) hours as needed for nausea or vomiting.     Selexipag 1600 MCG Tabs  Commonly known as:  UPTRAVI  Take 1,600 mcg by mouth 2 (two) times daily.        Disposition   The patient will be discharged in stable condition to home.  Follow-up Information    Follow up with [facility].   Why:  They will do your home health care at your home   Contact information:   [address] [place] [address] [phone]       Follow up with [facility] On 01/07/2015.   Specialty:  Cardiology   Why:  at 1120 with Amy. Please bring all of your medications. Pt parking code is 0900.   Contact information:   [address] [id number] [place] [phone]        Duration of Discharge Encounter: Greater than 30 minutes including physician and PA time.  Mable Fill R PA-C 12/28/2014, 11:34 AM

## 2015-01-07 ENCOUNTER — Encounter (HOSPITAL_COMMUNITY): Payer: Self-pay

## 2015-01-07 ENCOUNTER — Telehealth (HOSPITAL_COMMUNITY): Payer: Self-pay

## 2015-01-07 ENCOUNTER — Ambulatory Visit (HOSPITAL_COMMUNITY)
Admit: 2015-01-07 | Discharge: 2015-01-07 | Disposition: A | Discharge status: Home / Self Care | Payer: Medicare Other | Source: Ambulatory Visit | Attending: Internal Medicine | Admitting: Internal Medicine

## 2015-01-07 VITALS — BP 102/56 | HR 79 | Wt 149.5 lb

## 2015-01-07 DIAGNOSIS — Z79899 Other long term (current) drug therapy: Secondary | ICD-10-CM | POA: Diagnosis not present

## 2015-01-07 DIAGNOSIS — Z8249 Family history of ischemic heart disease and other diseases of the circulatory system: Secondary | ICD-10-CM | POA: Insufficient documentation

## 2015-01-07 DIAGNOSIS — K219 Gastro-esophageal reflux disease without esophagitis: Secondary | ICD-10-CM | POA: Insufficient documentation

## 2015-01-07 DIAGNOSIS — I509 Heart failure, unspecified: Secondary | ICD-10-CM

## 2015-01-07 DIAGNOSIS — K3184 Gastroparesis: Secondary | ICD-10-CM | POA: Insufficient documentation

## 2015-01-07 DIAGNOSIS — I5032 Chronic diastolic (congestive) heart failure: Secondary | ICD-10-CM | POA: Insufficient documentation

## 2015-01-07 DIAGNOSIS — I5033 Acute on chronic diastolic (congestive) heart failure: Secondary | ICD-10-CM

## 2015-01-07 DIAGNOSIS — E1142 Type 2 diabetes mellitus with diabetic polyneuropathy: Secondary | ICD-10-CM | POA: Insufficient documentation

## 2015-01-07 DIAGNOSIS — E1122 Type 2 diabetes mellitus with diabetic chronic kidney disease: Secondary | ICD-10-CM | POA: Insufficient documentation

## 2015-01-07 DIAGNOSIS — E039 Hypothyroidism, unspecified: Secondary | ICD-10-CM | POA: Diagnosis not present

## 2015-01-07 DIAGNOSIS — I480 Paroxysmal atrial fibrillation: Secondary | ICD-10-CM | POA: Diagnosis not present

## 2015-01-07 DIAGNOSIS — E1143 Type 2 diabetes mellitus with diabetic autonomic (poly)neuropathy: Secondary | ICD-10-CM | POA: Insufficient documentation

## 2015-01-07 DIAGNOSIS — D509 Iron deficiency anemia, unspecified: Secondary | ICD-10-CM | POA: Diagnosis not present

## 2015-01-07 DIAGNOSIS — N183 Chronic kidney disease, stage 3 unspecified: Secondary | ICD-10-CM

## 2015-01-07 DIAGNOSIS — I272 Other secondary pulmonary hypertension: Secondary | ICD-10-CM | POA: Diagnosis not present

## 2015-01-07 DIAGNOSIS — E032 Hypothyroidism due to medicaments and other exogenous substances: Secondary | ICD-10-CM

## 2015-01-07 DIAGNOSIS — E785 Hyperlipidemia, unspecified: Secondary | ICD-10-CM | POA: Diagnosis not present

## 2015-01-07 DIAGNOSIS — I50810 Right heart failure, unspecified: Secondary | ICD-10-CM

## 2015-01-07 DIAGNOSIS — M79606 Pain in leg, unspecified: Secondary | ICD-10-CM | POA: Diagnosis not present

## 2015-01-07 DIAGNOSIS — I130 Hypertensive heart and chronic kidney disease with heart failure and stage 1 through stage 4 chronic kidney disease, or unspecified chronic kidney disease: Secondary | ICD-10-CM | POA: Diagnosis not present

## 2015-01-07 DIAGNOSIS — D573 Sickle-cell trait: Secondary | ICD-10-CM | POA: Insufficient documentation

## 2015-01-07 DIAGNOSIS — M349 Systemic sclerosis, unspecified: Secondary | ICD-10-CM | POA: Insufficient documentation

## 2015-01-07 DIAGNOSIS — Z833 Family history of diabetes mellitus: Secondary | ICD-10-CM | POA: Insufficient documentation

## 2015-01-07 LAB — BASIC METABOLIC PANEL
ANION GAP: 13 (ref 5–15)
BUN: 50 mg/dL — ABNORMAL HIGH (ref 6–20)
CALCIUM: 9.4 mg/dL (ref 8.9–10.3)
CO2: 23 mmol/L (ref 22–32)
CREATININE: 1.64 mg/dL — AB (ref 0.44–1.00)
Chloride: 106 mmol/L (ref 101–111)
GFR, EST AFRICAN AMERICAN: 37 mL/min — AB (ref >60)
GFR, EST NON AFRICAN AMERICAN: 32 mL/min — AB (ref >60)
Glucose, Bld: 66 mg/dL (ref 65–99)
Potassium: 3.9 mmol/L (ref 3.5–5.1)
SODIUM: 142 mmol/L (ref 135–145)

## 2015-01-07 LAB — CBC
HCT: 26.6 % — ABNORMAL LOW (ref 36.0–46.0)
Hemoglobin: 8.4 g/dL — ABNORMAL LOW (ref 12.0–15.0)
MCH: 23 pg — ABNORMAL LOW (ref 26.0–34.0)
MCHC: 31.6 g/dL (ref 30.0–36.0)
MCV: 72.9 fL — ABNORMAL LOW (ref 78.0–100.0)
PLATELETS: 228 10*3/uL (ref 150–400)
RBC: 3.65 MIL/uL — ABNORMAL LOW (ref 3.87–5.11)
WBC: 4.4 10*3/uL (ref 4.0–10.5)

## 2015-01-07 LAB — MAGNESIUM: Magnesium: 1.6 mg/dL — ABNORMAL LOW (ref 1.7–2.4)

## 2015-01-07 MED ORDER — POTASSIUM CHLORIDE CRYS ER 20 MEQ PO TBCR: 40.0000 meq | EXTENDED_RELEASE_TABLET | Freq: Every day | ORAL | Status: DC

## 2015-01-07 MED ORDER — FUROSEMIDE 80 MG PO TABS: ORAL_TABLET | ORAL | Status: DC

## 2015-01-07 NOTE — Progress Notes (Signed)
Patient ID: [name], female   DOB: 06/28/1950, 64 y.o.   MRN: [id number]   Advanced Heart Failure Clinic Note   Patient ID: [name], female   DOB: [birth year], 64 y.o.   MRN: [id number] Pulmonary: Dr Lamonte Sakai  HF: Elizette Shek  HPI: Ms. Fruchter is a 64 yo female with a history of DM2, HLD, HTN, pHTN, GERD, gastroparesis, scleroderma, DJD and diastolic HF.   Of note she has been on several PAH targeted therapies and coumadin over last 9 years, but has not been reliably on therapy due to noncompliance and inability to have labs performed. She has had best response to Tyvaso (+ bosentan) with an improvement in 6 minute walk and in PASP from 102 mmHg (7/09) to 42 mmHg (9/10). After stopping Tyvaso her PASP rose to 80's by TTE 6/13 and 11/14. Surprisingly, she has never required supplemental O2. Macitentan was started in 11/14. She developed nausea and diarrhea and macitentan was stopped early 3/15. Most recently, she had been on Letairis and Adcirca (followed by Dr. Lamonte Sakai).   Admitted 10/14-10/18/15 for A/C RHF, volume overload and syncope. Diuresed with IV lasix and milrinone a total of 26 lbs. Discharge weight was 156 lbs and started on lasix 40 mg BID.   Admitted 12/29 with dyspnea and chest pain. BP was low, and Lasix and Adcirca were both initially held. Echo showed EF 60-65% with moderately dilated and dysfunctional RV, PASP 111 mmHg with moderate TR. There was a large pericardial effusion without definite tamponade. Presentation was suspected to be due to progressive PAH with secondary RV failure rather than tamponade. No pericardiocentesis yet. She was started on milrinone gtt and diuresed. Shorter-acting sildenafil was started instead of Adcirca. Also had GI bleed. 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 Discharge weight was 146 pounds.   Echo (1/16) with EF 55-60%, severely dilated RV with moderately decreased systolic function, large pericardial  effusion.   She was admitted in 2/16 with acute on chronic diastolic CHF/RV failure and hypotension.  Echo (2/16) showed EF 60%, moderate-severe RV dilation with moderate-severe RV systolic dysfunction, PA systolic pressure 62 mmHg, large pericardial effusion with borderline tamponade.  She had pericardiocentesis with improvement in symptoms.  No malignant cells in pericardial fluid.  Repeat echo showed no pericardial effusion.  She developed paroxysmal atrial fibrillation on 2/19 but went back into NSR on amiodarone.  She was not anticoagulated given history of GI bleeding and pericardial effusion.   She was again admitted in 10/16.  She had been out of meds x 1 week and gained 20 lbs.  She also was noted to be severely anemic with iron deficiency.  She was heme negative.  She got 2 units PRBCs.  She was diuresed extensively.  She was started on Levoxyl for hypothyroidism.   She returns today for post-hospital followup.  She says that her breathing is back to baseline.  Weight is down 18 lbs.  No dyspnea on flat ground but not very active.  No orthopnea/PND.  Main complaint is bilateral leg pain.  They "hurt all the time."  Seems like the entire leg.  She was given colchicine in the hospital without effect.  She is on vicodin and Lyrica with incomplete improvement.   6 min walk (05/16/14): 125 m 6 min walk (6/16): 146 m 6 min walk (8/16): 183 m  Echo (1/16) with EF 55-60%, severely dilated RA with moderately decreased systolic function, large pericardial effusion.  ECHO  05/09/2014: EF 60-65% Peak PA pressure 52  ECHO 06/05/14: EF 55-60%, RV moderately dilated with moderately decreased systolic function with D-shaped interventricular septum. Severe RAE. RVSP 59. No pericardial effusion. Echo (8/16): EF 60-65%, moderate-severe RV dilation with mildly decreased RV systolic function, D-shaped interventricular septum, PASP 92, severe biatrial enlargement, no pericardial effusion.  Echo (10/16): EF 60-65%,  D-shaped septum, RV moderately dilated, PASP 76 mmHg, severe TR.   RHC (1/16):  Hemodynamics (mmHg) RA mean 12 RV 64/14 PA 68/29, mean 42 PCWP mean 12 LV 102/14 AO 105/53 PA 67% AO 93% Cardiac Output (Fick) 6.63  Cardiac Index (Fick) 3.88  PVR 4.5 WU  Labs 03/24/2014 K 3.8 Creatinine 1.51 Hgb 9.3  Labs 2/16 BNP 121, K 4.4, creatinine 1.27, hgb 9.2 Labs 2/16 K 3.8, creatinine 1.58, AST 45, ALT 12 Labs 05/22/2014: Creatinine 2.1 K 3.7  Labs 06/05/2014: Creatinine 1.5 K 3.7, LFTs normal Labs 6/16: K 4.4, creatinine 1.82, hgb 8.8 Labs (10/16): K 4.4, creatinine 1.77, hgb 7.9, LFTs normal  ROS: All systems negative except as listed in HPI, PMH and Problem List.  SH:  Social History   Social History  . Marital Status: Legally Separated    Spouse Name: N/A  . Number of Children: 2  . Years of Education: 11   Occupational History  . Umemployed   . Disability     Social History Main Topics  . Smoking status: Never Smoker   . Smokeless tobacco: Never Used  . Alcohol Use: No  . Drug Use: No  . Sexual Activity: Not on file   Other Topics Concern  . Not on file   Social History Narrative    FAMILY HISTORY:  Significant for coronary artery disease and diabetes   Patient lives at home with granddaughter.    Patient has 2 children.    Patient has 11 years of education.    Patient is on disability.    Patient is right handed.    Patient is separated.      FH:  Family History  Problem Relation Age of Onset  . Heart disease Mother   . Diabetes Mother   . Diabetes Sister     Past Medical History  Diagnosis Date  . Secondary pulmonary hypertension ([place])     a. 2/2 scleroderma  . GERD (gastroesophageal reflux disease)   . 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 Unspecified essential hypertension   . Gastroparesis   . Sickle cell trait ([place])   . Obesity   . Scleroderma ([place])   . Trichomonas   . Peripheral neuropathy ([place])   . Type II diabetes mellitus (HCC)     a. diet  control   . Arthritis   . Chronic kidney disease   . Gout   . Hypothyroidism     a. may be due to amiodarone use. started on synthroid on 12/2014 admission   . Anemia     a.  hx of GI bleed and duodenal AVMs  . Chronic diastolic CHF (congestive heart failure) ([place])     a. Echo 12/23/14 withEF 60-65%, moderately dilated RV, PASP 72, no pericardial effusion.  Marland Kitchen PAF (paroxysmal atrial fibrillation) (HCC)     a. not a long term AC candidate due to GI bleeds    Current Outpatient Prescriptions  Medication Sig Dispense Refill  . ADCIRCA 20 MG TABS TAKE 2 TABLETS (40MG) BY MOUTH ONCE DAILY WITH OR WITHOUT FOOD. CALL [phone] FOR REFILLS. 60 tablet 11  . ambrisentan (LETAIRIS) 10  MG tablet Take 1 tablet (10 mg total) by mouth daily. 30 tablet 11  . amiodarone (PACERONE) 200 MG tablet Take 1 tablet (200 mg total) by mouth daily. 30 tablet 6  . colchicine 0.6 MG tablet Take 0.5 tablets (0.3 mg total) by mouth daily. For 5 more days. Then you can take as needed for gout flares. 30 tablet 0  . esomeprazole (NEXIUM) 40 MG capsule Take 40 mg by mouth 2 (two) times daily.     . fenofibrate 54 MG tablet Take 1 tablet (54 mg total) by mouth daily. 30 tablet 12  . FLUoxetine (PROZAC) 20 MG capsule take 1 capsule by mouth every morning 30 capsule 2  . furosemide (LASIX) 80 MG tablet Take 180m (1&1/2 tablets) in the morning and 821m(1 tablet) in the evening 120 tablet 6  . HYDROcodone-acetaminophen (NORCO) 10-325 MG per tablet Take 1 tablet by mouth every 6 (six) hours as needed for moderate pain.     . hydrOXYzine (ATARAX/VISTARIL) 10 MG tablet Take 1 tablet (10 mg total) by mouth 2 (two) times daily as needed for itching. 20 tablet 0  . levothyroxine (SYNTHROID, LEVOTHROID) 25 MCG tablet Take 1 tablet (25 mcg total) by mouth daily before breakfast. 30 tablet 11  . nortriptyline (PAMELOR) 10 MG capsule Take 20 mg by mouth at bedtime.    . potassium chloride SA (K-DUR,KLOR-CON) 20 MEQ tablet Take 2  tablets (40 mEq total) by mouth daily. 60 tablet 6  . pregabalin (LYRICA) 150 MG capsule Take 1 capsule (150 mg total) by mouth 3 (three) times daily. 90 capsule 5  . promethazine (PHENERGAN) 12.5 MG tablet Take 1 tablet (12.5 mg total) by mouth every 6 (six) hours as needed for nausea or vomiting. 45 tablet 1  . Selexipag (UPTRAVI) 1600 MCG TABS Take 1,600 mcg by mouth 2 (two) times daily. 60 tablet 3  . ferrous sulfate (CVS IRON) 325 (65 FE) MG tablet Take 325 mg by mouth daily with breakfast.     No current facility-administered medications for this encounter.    Filed Vitals:   01/07/15 1122  BP: 102/56  Pulse: 79  Weight: 149 lb 8 oz (67.813 kg)  SpO2: 100%    PHYSICAL EXAM:  General: Elderly and fatigued appearing. Neck: JVP 10-12 cm, no thyromegaly or thyroid nodule. Lungs: diminished, bilateral crackles in bases. CV: RV heave. Heart regular S1/S2 with widely split S2,  2/6 HSM LLSB. 1+ edema 1/2 to knees bilaterally.  Unable to palpate pedal pulses.  Abdomen: no hepatosplenomegaly. Nontender, nondistended. Neurologic: Alert and oriented x 3.  Psych: Affect flat. Extremities: No clubbing or cyanosis.   ASSESSMENT & PLAN: 1. 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  Echo 10/16  EF 60-65% with moderate dilated and mildly dysfunctional RV, severe TR. Pericardial effusion has resolved.  NYHA class III symptoms.  Volume status improved with 18 lb weight loss since prior to hospitalization, but she is still volume overloaded on exam. - Increase Lasix to 120 qam/80 qpm. Increase KCl to 40 daily.  - BMET today, repeat in 2 wks.  2. PAH with cor pulmonale: Severe PAH.  Had last [place] 1/16. Pulmonary pressure remains elevated by echo in 10/16. - Continue ambrisentan 10 mg daily.  - Continue selexipag 1600 mcg bid.  - Continue Adcirca 40 mg daily.  3. CKD III: Will follow closely with diuresis, check BMET today.  4. H/O GI bleed: Duodenal  AVM noted  03/23/14.  She is no longer anticoagulated.  5. Pericardial effusion: Resolved after pericardiocentesis, not seen on echo 12/2014.   6. Atrial fibrillation: NSR by exam. Paroxysmal.  Continue amiodarone. She is not anticoagulated due to h/o GI bleeding. LFTs normal in 10/16.  Recently started on Levoxyl, repeat TSH in 6 wks. She will need a yearly eye exam while on amiodarone. 7.  Hypothyroidism: May be related to amiodarone.  She is now on Levoxyl 25 mcg daily.  TSH in 6 wks.  8. Anemia: Fe-deficiency.  Heme negative in hospital.  She received IV iron in hospital and is now on po iron.  I will refer her to hematology for management of severe Fe-deficiency anemia (will need periodic IV Fe).  CBC today.  9. Leg pain: Etiology uncertain.  ?Neuropathy.  She is on Lyrica and Vicodin.  I will get peripheral arterial dopplers to rule out PAD.    [name] 01/07/2015 10:54 PM

## 2015-01-07 NOTE — Patient Instructions (Signed)
Your provider has requested you have ABIs.  Routine lab work today. bmet cbc mag Will notify you of abnormal results  FOLLOW UP 2 weeks with NP clinic.  You have been referred to Hematology for iron infusion.

## 2015-01-07 NOTE — Telephone Encounter (Signed)
Patient given clarification on Lasix dosage and Potasium dosage as of 10/25  Repeated back instructions correctly

## 2015-01-09 ENCOUNTER — Other Ambulatory Visit (HOSPITAL_COMMUNITY): Payer: Self-pay | Admitting: Cardiology

## 2015-01-09 DIAGNOSIS — I5032 Chronic diastolic (congestive) heart failure: Secondary | ICD-10-CM

## 2015-01-09 DIAGNOSIS — I739 Peripheral vascular disease, unspecified: Secondary | ICD-10-CM

## 2015-01-14 ENCOUNTER — Telehealth: Payer: Self-pay | Admitting: Hematology and Oncology

## 2015-01-14 NOTE — Telephone Encounter (Signed)
New patient appt-s/w patient and gave np appt for 11/11 @ 1 w/Dr. Lindi Adie Referring Dr. [name] Dx- Iron def anemia

## 2015-01-15 ENCOUNTER — Ambulatory Visit (HOSPITAL_COMMUNITY)
Admission: RE | Admit: 2015-01-15 | Discharge: 2015-01-15 | Disposition: A | Discharge status: Home / Self Care | Payer: Medicare Other | Source: Ambulatory Visit | Attending: Cardiology | Admitting: Cardiology

## 2015-01-15 DIAGNOSIS — I739 Peripheral vascular disease, unspecified: Secondary | ICD-10-CM | POA: Insufficient documentation

## 2015-01-15 DIAGNOSIS — E1140 Type 2 diabetes mellitus with diabetic neuropathy, unspecified: Secondary | ICD-10-CM | POA: Insufficient documentation

## 2015-01-15 DIAGNOSIS — I5032 Chronic diastolic (congestive) heart failure: Secondary | ICD-10-CM | POA: Insufficient documentation

## 2015-01-20 NOTE — Patient Outreach (Signed)
[facility]) Care Management  01/20/2015  [name] 02-03-1951 [id number]   Patient triggered RED on EMMI Heart Failure Dashboard, assigned Sherrin Daisy, RN to outreach for [facility] Management services.  Thanks, [name]. [name], [name] Assistant Phone: [phone] Fax: [phone]

## 2015-01-21 ENCOUNTER — Ambulatory Visit (HOSPITAL_COMMUNITY)
Admission: RE | Admit: 2015-01-21 | Discharge: 2015-01-21 | Disposition: A | Discharge status: Home / Self Care | Payer: Medicare Other | Source: Ambulatory Visit | Attending: Internal Medicine | Admitting: Internal Medicine

## 2015-01-21 VITALS — BP 102/60 | HR 80 | Wt 151.0 lb

## 2015-01-21 DIAGNOSIS — E1142 Type 2 diabetes mellitus with diabetic polyneuropathy: Secondary | ICD-10-CM | POA: Diagnosis not present

## 2015-01-21 DIAGNOSIS — E039 Hypothyroidism, unspecified: Secondary | ICD-10-CM | POA: Diagnosis not present

## 2015-01-21 DIAGNOSIS — I130 Hypertensive heart and chronic kidney disease with heart failure and stage 1 through stage 4 chronic kidney disease, or unspecified chronic kidney disease: Secondary | ICD-10-CM | POA: Diagnosis not present

## 2015-01-21 DIAGNOSIS — D509 Iron deficiency anemia, unspecified: Secondary | ICD-10-CM | POA: Insufficient documentation

## 2015-01-21 DIAGNOSIS — Z833 Family history of diabetes mellitus: Secondary | ICD-10-CM | POA: Insufficient documentation

## 2015-01-21 DIAGNOSIS — I509 Heart failure, unspecified: Secondary | ICD-10-CM | POA: Insufficient documentation

## 2015-01-21 DIAGNOSIS — I480 Paroxysmal atrial fibrillation: Secondary | ICD-10-CM | POA: Diagnosis not present

## 2015-01-21 DIAGNOSIS — E785 Hyperlipidemia, unspecified: Secondary | ICD-10-CM | POA: Diagnosis not present

## 2015-01-21 DIAGNOSIS — M79606 Pain in leg, unspecified: Secondary | ICD-10-CM | POA: Insufficient documentation

## 2015-01-21 DIAGNOSIS — D573 Sickle-cell trait: Secondary | ICD-10-CM | POA: Diagnosis not present

## 2015-01-21 DIAGNOSIS — Z79899 Other long term (current) drug therapy: Secondary | ICD-10-CM | POA: Insufficient documentation

## 2015-01-21 DIAGNOSIS — Z8249 Family history of ischemic heart disease and other diseases of the circulatory system: Secondary | ICD-10-CM | POA: Insufficient documentation

## 2015-01-21 DIAGNOSIS — N183 Chronic kidney disease, stage 3 (moderate): Secondary | ICD-10-CM | POA: Insufficient documentation

## 2015-01-21 DIAGNOSIS — I50810 Right heart failure, unspecified: Secondary | ICD-10-CM

## 2015-01-21 DIAGNOSIS — M1990 Unspecified osteoarthritis, unspecified site: Secondary | ICD-10-CM | POA: Diagnosis not present

## 2015-01-21 DIAGNOSIS — I272 Other secondary pulmonary hypertension: Secondary | ICD-10-CM | POA: Diagnosis not present

## 2015-01-21 DIAGNOSIS — E1122 Type 2 diabetes mellitus with diabetic chronic kidney disease: Secondary | ICD-10-CM | POA: Diagnosis not present

## 2015-01-21 DIAGNOSIS — I5033 Acute on chronic diastolic (congestive) heart failure: Secondary | ICD-10-CM

## 2015-01-21 DIAGNOSIS — I2721 Secondary pulmonary arterial hypertension: Secondary | ICD-10-CM

## 2015-01-21 DIAGNOSIS — I5032 Chronic diastolic (congestive) heart failure: Secondary | ICD-10-CM | POA: Diagnosis not present

## 2015-01-21 DIAGNOSIS — K219 Gastro-esophageal reflux disease without esophagitis: Secondary | ICD-10-CM | POA: Diagnosis not present

## 2015-01-21 LAB — BASIC METABOLIC PANEL
Anion gap: 13 (ref 5–15)
BUN: 49 mg/dL — AB (ref 6–20)
CALCIUM: 9.3 mg/dL (ref 8.9–10.3)
CO2: 21 mmol/L — ABNORMAL LOW (ref 22–32)
CREATININE: 1.67 mg/dL — AB (ref 0.44–1.00)
Chloride: 108 mmol/L (ref 101–111)
GFR calc Af Amer: 37 mL/min — ABNORMAL LOW (ref >60)
GFR, EST NON AFRICAN AMERICAN: 32 mL/min — AB (ref >60)
GLUCOSE: 69 mg/dL (ref 65–99)
POTASSIUM: 3.9 mmol/L (ref 3.5–5.1)
Sodium: 142 mmol/L (ref 135–145)

## 2015-01-21 LAB — BRAIN NATRIURETIC PEPTIDE: B Natriuretic Peptide: 449.8 pg/mL — ABNORMAL HIGH (ref 0.0–100.0)

## 2015-01-21 MED ORDER — MAGNESIUM OXIDE 400 (241.3 MG) MG PO TABS: 400.0000 mg | ORAL_TABLET | Freq: Two times a day (BID) | ORAL | Status: DC

## 2015-01-21 MED ORDER — TORSEMIDE 20 MG PO TABS: 60.0000 mg | ORAL_TABLET | Freq: Two times a day (BID) | ORAL | Status: DC

## 2015-01-21 NOTE — Progress Notes (Signed)
Advanced Heart Failure Medication Review by a Pharmacist  Does the patient  feel that his/her medications are working for him/her?  yes  Has the patient been experiencing any side effects to the medications prescribed?  no  Does the patient measure his/her own blood pressure or blood glucose at home?  yes   Does the patient have any problems obtaining medications due to transportation or finances?   no  Understanding of regimen: good Understanding of indications: good Potential of compliance: good Patient understands to avoid NSAIDs. Patient understands to avoid decongestants.  Issues to address at subsequent visits: None   Pharmacist comments:  [name] is a pleasant 64 yo F presenting with a current medication list. She reports good compliance with her medications but states that she is still having significant lower extremity pain which seems to get worse throughout the day and improves upon laying down. She did not have any specific medication-related questions or concerns for me at this time.   [name]. [name], PharmD, BCPS, CPP Clinical Pharmacist Pager: [phone] Phone: [phone] 01/21/2015 11:19 AM      Time with patient: 6 minutes Preparation and documentation time: 2 minutes Total time: 8 minutes

## 2015-01-21 NOTE — Patient Instructions (Signed)
Labs:  Today will call if abnormal   Medications:  Start Mag Ox 400 mg twice a day  Stop Lasix  Start Torsemide 60 mg (3 tablets) twice a day  Follow up in 1 weeks with Amy NP

## 2015-01-21 NOTE — Progress Notes (Signed)
Patient ID: [name], female   DOB: June 10, 1950, 64 y.o.   MRN: [id number] P  Advanced Heart Failure Clinic Note   Patient ID: [name], female   DOB: 1950/07/13, 64 y.o.   MRN: [id number] Pulmonary: Dr [name]  HF: [name]  HPI: [name] is a 64 yo female with a history of DM2, HLD, HTN, pHTN, GERD, gastroparesis, scleroderma, DJD and diastolic HF.   Of note she has been on several PAH targeted therapies and coumadin over last 9 years, but has not been reliably on therapy due to noncompliance and inability to have labs performed. She has had best response to Tyvaso (+ bosentan) with an improvement in 6 minute walk and in PASP from 102 mmHg (7/09) to 42 mmHg (9/10). After stopping Tyvaso her PASP rose to 80's by TTE 6/13 and 11/14. Surprisingly, she has never required supplemental O2. Macitentan was started in 11/14. She developed nausea and diarrhea and macitentan was stopped early 3/15. Most recently, she had been on Letairis and Adcirca (followed by Dr. Lamonte [name]).   Admitted 10/14-10/18/15 for A/C RHF, volume overload and syncope. Diuresed with IV lasix and milrinone a total of 26 lbs. Discharge weight was 156 lbs and started on lasix 40 mg BID.   Admitted 12/29 with dyspnea and chest pain. BP was low, and Lasix and Adcirca were both initially held. Echo showed EF 60-65% with moderately dilated and dysfunctional RV, PASP 111 mmHg with moderate TR. There was a large pericardial effusion without definite tamponade. Presentation was suspected to be due to progressive PAH with secondary RV failure rather than tamponade. No pericardiocentesis yet. She was started on milrinone gtt and diuresed. Shorter-acting sildenafil was started instead of Adcirca. Also had GI bleed. 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 Discharge weight was 146 pounds.   Echo (1/16) with EF 55-60%, severely dilated RV with moderately decreased systolic function, large  pericardial effusion.   She was admitted in 2/16 with acute on chronic diastolic CHF/RV failure and hypotension.  Echo (2/16) showed EF 60%, moderate-severe RV dilation with moderate-severe RV systolic dysfunction, PA systolic pressure 62 mmHg, large pericardial effusion with borderline tamponade.  She had pericardiocentesis with improvement in symptoms.  No malignant cells in pericardial fluid.  Repeat echo showed no pericardial effusion.  She developed paroxysmal atrial fibrillation on 2/19 but went back into NSR on amiodarone.  She was not anticoagulated given history of GI bleeding and pericardial effusion.   She was again admitted in 10/16.  She had been out of meds x 1 week and gained 20 lbs.  She also was noted to be severely anemic with iron deficiency.  She was heme negative.  She got 2 units PRBCs.  She was diuresed extensively.  She was started on Levoxyl for hypothyroidism.   She returns today for followup. Still has pain in legs bilaterally with walking. Legs feel better when elevated. ABIs 11/16 were normal.  Overall feels ok. Ongoing dyspnea with exertion after 20-30 feet. Mild dizziness occasionally.  Weight at home trending up from 147 pounds despite increasing Lasix at last appointment. Says she has limited urine output with lasix. Appetite ok.   6 min walk (05/16/14): 125 m 6 min walk (6/16): 146 m 6 min walk (8/16): 183 m  Echo (1/16) with EF 55-60%, severely dilated RA with moderately decreased systolic function, large pericardial effusion.  ECHO 05/09/2014: EF 60-65% Peak PA pressure 52  ECHO 06/05/14: EF 55-60%, RV  moderately dilated with moderately decreased systolic function with D-shaped interventricular septum. Severe RAE. RVSP 59. No pericardial effusion. Echo (8/16): EF 60-65%, moderate-severe RV dilation with mildly decreased RV systolic function, D-shaped interventricular septum, PASP 92, severe biatrial enlargement, no pericardial effusion.  Echo (10/16): EF 60-65%,  D-shaped septum, RV moderately dilated, PASP 76 mmHg, severe TR.  ABIs (11/16): Normal  RHC (1/16):  Hemodynamics (mmHg) RA mean 12 RV 64/14 PA 68/29, mean 42 PCWP mean 12 LV 102/14 AO 105/53 PA 67% AO 93% Cardiac Output (Fick) 6.63  Cardiac Index (Fick) 3.88  PVR 4.5 WU  Labs 03/24/2014 K 3.8 Creatinine 1.51 Hgb 9.3  Labs 2/16 BNP 121, K 4.4, creatinine 1.27, hgb 9.2 Labs 2/16 K 3.8, creatinine 1.58, AST 45, ALT 12 Labs 05/22/2014: Creatinine 2.1 K 3.7  Labs 06/05/2014: Creatinine 1.5 K 3.7, LFTs normal Labs 6/16: K 4.4, creatinine 1.82, hgb 8.8 Labs 10/16: K 4.4, creatinine 1.77 => 1.64, hgb 7.9 => 8.4, LFTs normal, Mg 1.6  ECG: NSR, 1st degree AVB, incomplete RBBB  ROS: All systems negative except as listed in HPI, PMH and Problem List.  SH:  Social History   Social History  . Marital Status: Legally Separated    Spouse Name: N/A  . Number of Children: 2  . Years of Education: 11   Occupational History  . Umemployed   . Disability     Social History Main Topics  . Smoking status: Never Smoker   . Smokeless tobacco: Never Used  . Alcohol Use: No  . Drug Use: No  . Sexual Activity: Not on file   Other Topics Concern  . Not on file   Social History Narrative    FAMILY HISTORY:  Significant for coronary artery disease and diabetes   Patient lives at home with granddaughter.    Patient has 2 children.    Patient has 11 years of education.    Patient is on disability.    Patient is right handed.    Patient is separated.      FH:  Family History  Problem Relation Age of Onset  . Heart disease Mother   . Diabetes Mother   . Diabetes Sister     Past Medical History  Diagnosis Date  . Secondary pulmonary hypertension ([place])     a. 2/2 scleroderma  . GERD (gastroesophageal reflux disease)   . Systemic sclerosis ([place])   . Unspecified essential hypertension   . Gastroparesis   . Sickle cell trait ([place])   . Obesity   . Scleroderma ([place])   .  Trichomonas   . Peripheral neuropathy ([place])   . Type II diabetes mellitus (HCC)     a. diet control   . Arthritis   . Chronic kidney disease   . Gout   . Hypothyroidism     a. may be due to amiodarone use. started on synthroid on 12/2014 admission   . Anemia     a.  hx of GI bleed and duodenal AVMs  . Chronic diastolic CHF (congestive heart failure) ([place])     a. Echo 12/23/14 withEF 60-65%, moderately dilated RV, PASP 72, no pericardial effusion.  Marland Kitchen PAF (paroxysmal atrial fibrillation) (HCC)     a. not a long term AC candidate due to GI bleeds    Current Outpatient Prescriptions  Medication Sig Dispense Refill  . ADCIRCA 20 MG TABS TAKE 2 TABLETS (40MG) BY MOUTH ONCE DAILY WITH OR WITHOUT FOOD. CALL [phone] FOR REFILLS. 60 tablet 11  .  ambrisentan (LETAIRIS) 10 MG tablet Take 1 tablet (10 mg total) by mouth daily. 30 tablet 11  . amiodarone (PACERONE) 200 MG tablet Take 1 tablet (200 mg total) by mouth daily. 30 tablet 6  . colchicine 0.6 MG tablet Take 0.5 tablets (0.3 mg total) by mouth daily. For 5 more days. Then you can take as needed for gout flares. 30 tablet 0  . esomeprazole (NEXIUM) 40 MG capsule Take 40 mg by mouth 2 (two) times daily.     . fenofibrate 54 MG tablet Take 1 tablet (54 mg total) by mouth daily. 30 tablet 12  . ferrous sulfate ([employer] IRON) 325 (65 FE) MG tablet Take 325 mg by mouth daily with breakfast.    . FLUoxetine (PROZAC) 20 MG capsule take 1 capsule by mouth every morning 30 capsule 2  . furosemide (LASIX) 80 MG tablet Take 174m (1&1/2 tablets) in the morning and 864m(1 tablet) in the evening 120 tablet 6  . HYDROcodone-acetaminophen (NORCO) 10-325 MG per tablet Take 1 tablet by mouth every 6 (six) hours as needed for moderate pain.     . hydrOXYzine (ATARAX/VISTARIL) 10 MG tablet Take 1 tablet (10 mg total) by mouth 2 (two) times daily as needed for itching. 20 tablet 0  . levothyroxine (SYNTHROID, LEVOTHROID) 25 MCG tablet Take 1 tablet (25 mcg  total) by mouth daily before breakfast. 30 tablet 11  . nortriptyline (PAMELOR) 10 MG capsule Take 20 mg by mouth at bedtime.    . potassium chloride SA (K-DUR,KLOR-CON) 20 MEQ tablet Take 2 tablets (40 mEq total) by mouth daily. 60 tablet 6  . pregabalin (LYRICA) 150 MG capsule Take 1 capsule (150 mg total) by mouth 3 (three) times daily. 90 capsule 5  . promethazine (PHENERGAN) 12.5 MG tablet Take 1 tablet (12.5 mg total) by mouth every 6 (six) hours as needed for nausea or vomiting. 45 tablet 1  . Selexipag (UPTRAVI) 1600 MCG TABS Take 1,600 mcg by mouth 2 (two) times daily. 60 tablet 3   No current facility-administered medications for this encounter.    Filed Vitals:   01/21/15 1109  BP: 102/60  Pulse: 80  Weight: 151 lb (68.493 kg)  SpO2: 94%    PHYSICAL EXAM:  General: Elderly and fatigued appearing. Neck: JVP 8-9 cm. No o thyromegaly or thyroid nodule. Lungs: diminished, bilateral crackles in bases. CV: RV heave. Heart regular S1/S2 with widely split S2,  2/6 HSM LLSB. 1+ edema 1/2 to knees bilaterally.  Unable to palpate pedal pulses.  Abdomen: no hepatosplenomegaly. Nontender, nondistended. Neurologic: Alert and oriented x 3.  Psych: Affect flat. Extremities: No clubbing or cyanosis.   ASSESSMENT & PLAN: 1. 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  Echo 10/16  EF 60-65% with moderate dilated and mildly dysfunctional RV, severe TR. Pericardial effusion has resolved.  NYHA class III symptoms.   Volume status elevated.  - Stop lasix and start torsemide 60 mg twice a day.  Continue current KCl.   - BMET/BNP today and repeat in 1 week.  2. PAH with cor pulmonale: Severe PAH.  Had last [place] 1[place]16. Pulmonary pressure remains elevated by echo in 10/16. - Continue ambrisentan 10 mg daily.  - Continue selexipag 1600 mcg bid.  - Continue Adcirca 40 mg daily.  - 6 minute walk at next appointment after further diuresis.  3. CKD III: Check BMET  today.  4. 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  She is  no longer anticoagulated.  5. Pericardial effusion: Resolved after pericardiocentesis, not seen on echo 12/2014.   6. Atrial fibrillation: NSR today. Paroxysmal.  Continue amiodarone. She is not anticoagulated due to h/o GI bleeding. LFTs normal in 10/16.  Recently started on Levoxyl, repeat TSH in 12/16. She will need a yearly eye exam while on amiodarone. 7.  Hypothyroidism: May be related to amiodarone.  She is now on Levoxyl 25 mcg daily.  TSH in 6 wks.  8. Anemia: Fe-deficiency.  Heme negative in hospital.  She received IV iron in hospital and is now on po iron.  She has been referred to hematology for management of severe Fe-deficiency anemia (will need periodic IV Fe).   9. Leg pain: Etiology uncertain.  ?Neuropathy.  She is on Lyrica and Vicodin.  Arterial dopplers ok. Add mag 400 mg daily.    Follow up in 10 days with Dr Precious Bard 01/21/2015 11:10 AM   Patient seen with NP, agree with the above note.  She is still volume overloaded with NYHA class III symptoms.  I agree with switch from Lasix to torsemide 60 mg bid, check BMET today and in 10 days at followup.  Still having bilateral leg pain, ?neuropathy.  ABIs were normal, suggesting absence of significant PAD.  Will get 6 minute walk at followup when she is better diuresed.   [name] 01/21/2015 12:52 PM

## 2015-01-22 ENCOUNTER — Other Ambulatory Visit: Payer: Self-pay | Admitting: *Deleted

## 2015-01-22 DIAGNOSIS — I5032 Chronic diastolic (congestive) heart failure: Secondary | ICD-10-CM

## 2015-01-22 NOTE — Patient Outreach (Signed)
[facility] [place] [facility]) Care Management  01/22/2015  SIMI BRIEL [birth year] [id number]  EMMI-Heart Failure referral:  Recent admission 10/10-10/15/2016-acute on chronic heart failure per EPIC.  Telephone call to patient, who gave HIPPA verification. Advised of reason for call and of [facility] care management services.  Subjective: Patient states had doctor's appointment with Dr. Aundra Dubin 01/21/2015-(heart failure clinic). States has gotten all of prescriptions filled and manages own medications.  States has scales and is weighing daily but not sure if she has put on weight. Not recording weights. States not on oxygen and not having trouble breathing today. States grand daughter lives with her but she goes to school during day. States she prepares her own meals. Has never had meals on wheels but is interested in program. Patient very reluctant and not answering telephonic assessment questions. Unable to assess needs. Patient advised of [facility] community care services & social work services. Agrees to community care Warehouse manager and social work Retail buyer.  Objective: See medication list as listed in Epic.  Assessment; Recent hospital stay with acute on chronic heart failure. Patient resistant to telephonic assessment and not answering questions. Unable to assess knowledge of heart failure zones.  Admits to needing help with meals. Interested in talking with social worker about program.  Agrees to community care coordinator services.  Plan: Referral to care management assistant to refer to community care coordinator for complex case management and clinical social worker for psychosocial needs. Sherrin Daisy, RN BSN [place] Management Coordinator [facility] Care Management  [phone]

## 2015-01-24 ENCOUNTER — Ambulatory Visit (HOSPITAL_BASED_OUTPATIENT_CLINIC_OR_DEPARTMENT_OTHER): Payer: Medicare Other | Admitting: Hematology and Oncology

## 2015-01-24 ENCOUNTER — Other Ambulatory Visit: Payer: Self-pay | Admitting: Hematology and Oncology

## 2015-01-24 ENCOUNTER — Ambulatory Visit (HOSPITAL_BASED_OUTPATIENT_CLINIC_OR_DEPARTMENT_OTHER): Payer: Medicare Other

## 2015-01-24 VITALS — BP 98/60 | HR 70 | Temp 98.0°F | Resp 18 | Ht 64.0 in | Wt 147.8 lb

## 2015-01-24 DIAGNOSIS — D509 Iron deficiency anemia, unspecified: Secondary | ICD-10-CM

## 2015-01-24 LAB — IRON AND TIBC CHCC
%SAT: 10 % — AB (ref 21–57)
Iron: 39 ug/dL — ABNORMAL LOW (ref 41–142)
TIBC: 379 ug/dL (ref 236–444)
UIBC: 340 ug/dL (ref 120–384)

## 2015-01-24 LAB — CBC & DIFF AND RETIC
BASO%: 0.2 % (ref 0.0–2.0)
Basophils Absolute: 0 10*3/uL (ref 0.0–0.1)
EOS ABS: 0.1 10*3/uL (ref 0.0–0.5)
EOS%: 2.1 % (ref 0.0–7.0)
HEMATOCRIT: 27.5 % — AB (ref 34.8–46.6)
HGB: 8.6 g/dL — ABNORMAL LOW (ref 11.6–15.9)
IMMATURE RETIC FRACT: 7.9 % (ref 1.60–10.00)
LYMPH#: 1.2 10*3/uL (ref 0.9–3.3)
LYMPH%: 25.2 % (ref 14.0–49.7)
MCH: 24.1 pg — ABNORMAL LOW (ref 25.1–34.0)
MCHC: 31.3 g/dL — AB (ref 31.5–36.0)
MCV: 77 fL — ABNORMAL LOW (ref 79.5–101.0)
MONO#: 0.3 10*3/uL (ref 0.1–0.9)
MONO%: 6.5 % (ref 0.0–14.0)
NEUT#: 3.2 10*3/uL (ref 1.5–6.5)
NEUT%: 66 % (ref 38.4–76.8)
NRBC: 0 % (ref 0–0)
RBC: 3.57 10*6/uL — AB (ref 3.70–5.45)
RETIC %: 1.55 % (ref 0.70–2.10)
RETIC CT ABS: 55.34 10*3/uL (ref 33.70–90.70)
WBC: 4.8 10*3/uL (ref 3.9–10.3)

## 2015-01-24 LAB — FERRITIN CHCC: Ferritin: 110 ng/ml (ref 9–269)

## 2015-01-24 LAB — TECHNOLOGIST REVIEW

## 2015-01-24 NOTE — Patient Outreach (Addendum)
[facility] [facility]) Care Management  01/24/2015  [name] [birth year] [id number]   Request from Sherrin Daisy, RN to assign [employer] and SW, assigned Valente David, Therapist, sports and [employer], .  Thanks, [name]. [place], [place] Assistant Phone: [phone] Fax: [phone]

## 2015-01-24 NOTE — Progress Notes (Signed)
[name] CONSULT NOTE  Patient Care Team: Sid Falcon, MD as PCP - General (Internal Medicine) Michel Bickers, MD as Consulting Physician (Infectious Diseases) Dyke Maes, OD (Optometry)  CHIEF COMPLAINTS/PURPOSE OF CONSULTATION:  Chronic iron deficiency anemia  HISTORY OF PRESENTING ILLNESS:  [name] 64 y.o. female is here because of chronic iron deficiency anemia. Patient has history of sickle cell trait and has had multiple medical problems including duodenal AV malformation related bleeding. She had colonoscopy and endoscopy in 2015. She was admitted to the hospital recently with diastolic heart failure. At that time blood work revealed microcytic anemia. Iron studies on 12/23/2014 revealed iron saturation of 3% and a ferritin of 10 with a TIBC of 393. She was given 2 doses of IV iron therapy in October 2016. She also received 2 doses of IV iron therapy in February 2016 as well. She does not remember feeling any improvement after the IV iron treatment.  I reviewed her records extensively and collaborated the history with the patient.  MEDICAL HISTORY:  Past Medical History  Diagnosis Date  . Secondary pulmonary hypertension ([place])     a. 2/2 scleroderma  . GERD (gastroesophageal reflux disease)   . Systemic sclerosis ([place])   . Unspecified essential hypertension   . Gastroparesis   . Sickle cell trait ([place])   . Obesity   . Scleroderma ([place])   . Trichomonas   . Peripheral neuropathy ([place])   . Type II diabetes mellitus (HCC)     a. diet control   . Arthritis   . Chronic kidney disease   . Gout   . Hypothyroidism     a. may be due to amiodarone use. started on synthroid on 12/2014 admission   . Anemia     a.  hx of GI bleed and duodenal AVMs  . Chronic diastolic CHF (congestive heart failure) ([place])     a. Echo 12/23/14 withEF 60-65%, moderately dilated RV, PASP 72, no pericardial effusion.  Marland Kitchen PAF (paroxysmal atrial fibrillation) (HCC)     a. not  a long term AC candidate due to GI bleeds    SURGICAL HISTORY: Past Surgical History  Procedure Laterality Date  . Tubal ligation    . Esophagogastroduodenoscopy  02/23/2010    D 2 AVM, ablated with APC.   Marland Kitchen Shoulder arthroscopy Right 12/2009    subacromial decompression  . Replacement total knee Left 11/2000  . Cardiac catheterization Right 04/24/2004  . Tonsillectomy  1960's  . Abdominal hysterectomy  1983    "partial"  . Shoulder arthroscopy w/ rotator cuff repair Right 01/2010  . Excisional total knee arthroplasty with antibiotic spacers Left 08/2010    "got infected & had to take 1st replacement out"  . Revision total knee arthroplasty Left 11/2010    "removed spacers; replaced knee"  . Total knee revision with scar debridement/patella revision with poly exchange Left 12/2010    fell; knee split opened; had to redo revision"  . Cardiac catheterization Left 07/2010  . Peripherally inserted central catheter insertion  09/2010  . Colonoscopy  09/06/2008, 09/2013    int rrhoids:Dr. Lajoyce Corners 2010. Pan-colonic AVMs, microscopic colitis per Dr Deatra Ina 2015  . Right heart catheterization N/A 03/18/2014    Procedure: RIGHT HEART CATH;  Surgeon: Larey Dresser, MD;  Location: [facility] CATH LAB;  Service: Cardiovascular;  Laterality: N/A;  . Esophagogastroduodenoscopy N/A 03/23/2014    Procedure: ESOPHAGOGASTRODUODENOSCOPY (EGD);  Surgeon: Ladene Artist, MD;  Location: [facility] ENDOSCOPY;  Service: Endoscopy;  Laterality: N/A;  . Pericardial tap N/A 05/03/2014    Procedure: PERICARDIAL TAP;  Surgeon: Sinclair Grooms, MD;  Location: [facility] CATH LAB;  Service: Cardiovascular;  Laterality: N/A;    SOCIAL HISTORY: Social History   Social History  . Marital Status: Legally Separated    Spouse Name: N/A  . Number of Children: 2  . Years of Education: 11   Occupational History  . Umemployed   . Disability     Social History Main Topics  . Smoking status: Never Smoker   . Smokeless tobacco: Never Used  .  Alcohol Use: No  . Drug Use: No  . Sexual Activity: Not on file   Other Topics Concern  . Not on file   Social History Narrative    FAMILY HISTORY:  Significant for coronary artery disease and diabetes   Patient lives at home with granddaughter.    Patient has 2 children.    Patient has 11 years of education.    Patient is on disability.    Patient is right handed.    Patient is separated.      FAMILY HISTORY: Family History  Problem Relation Age of Onset  . Heart disease Mother   . Diabetes Mother   . Diabetes Sister     ALLERGIES:  is allergic to cephalexin; ciprofloxacin; codeine; contrast media; and iohexol.  MEDICATIONS:  Current Outpatient Prescriptions  Medication Sig Dispense Refill  . ADCIRCA 20 MG TABS TAKE 2 TABLETS (40MG) BY MOUTH ONCE DAILY WITH OR WITHOUT FOOD. CALL [phone] FOR REFILLS. 60 tablet 11  . ambrisentan (LETAIRIS) 10 MG tablet Take 1 tablet (10 mg total) by mouth daily. 30 tablet 11  . amiodarone (PACERONE) 200 MG tablet Take 1 tablet (200 mg total) by mouth daily. 30 tablet 6  . esomeprazole (NEXIUM) 40 MG capsule Take 40 mg by mouth 2 (two) times daily.     . fenofibrate 54 MG tablet Take 1 tablet (54 mg total) by mouth daily. 30 tablet 12  . ferrous sulfate ([employer]S IRON) 325 (65 FE) MG tablet Take 325 mg by mouth daily with breakfast.    . FLUoxetine (PROZAC) 20 MG capsule take 1 capsule by mouth every morning 30 capsule 2  . HYDROcodone-acetaminophen (NORCO) 10-325 MG per tablet Take 1 tablet by mouth every 6 (six) hours as needed for moderate pain.     . hydrOXYzine (ATARAX/VISTARIL) 10 MG tablet Take 1 tablet (10 mg total) by mouth 2 (two) times daily as needed for itching. 20 tablet 0  . levothyroxine (SYNTHROID, LEVOTHROID) 25 MCG tablet Take 1 tablet (25 mcg total) by mouth daily before breakfast. 30 tablet 11  . nortriptyline (PAMELOR) 10 MG capsule Take 20 mg by mouth at bedtime.    . potassium chloride SA (K-DUR,KLOR-CON) 20 MEQ tablet  Take 2 tablets (40 mEq total) by mouth daily. 60 tablet 6  . pregabalin (LYRICA) 150 MG capsule Take 1 capsule (150 mg total) by mouth 3 (three) times daily. 90 capsule 5  . promethazine (PHENERGAN) 12.5 MG tablet Take 1 tablet (12.5 mg total) by mouth every 6 (six) hours as needed for nausea or vomiting. 45 tablet 1  . Selexipag (UPTRAVI) 1600 MCG TABS Take 1,600 mcg by mouth 2 (two) times daily. 60 tablet 3  . torsemide (DEMADEX) 20 MG tablet Take 3 tablets (60 mg total) by mouth 2 (two) times daily. 180 tablet 3   No current facility-administered medications for this visit.    REVIEW OF  SYSTEMS:   Constitutional: Denies fevers, chills or abnormal night sweats Eyes:  blurring of vision Ears, nose, mouth, throat, and face: Denies mucositis or sore throat Respiratochronic shortness of breathdiovascular: Denies palpitation, chest discomfort or lower extremity swelling Gastrointestinal:  Denies nausea, heartburn or change in bowel habits Skin: Denies abnormal skin rashes Lymphatics: Denies new lymphadenopathy or easy bruising Neurological:Denies numbness, tingling or new weaknesses Behavioral/Psych: Mood is stable, no new changes  All other systems were reviewed with the patient and are negative.  PHYSICAL EXAMINATION: ECOG PERFORMANCE STATUS: 2 - Symptomatic, <50% confined to bed  Filed Vitals:   01/24/15 1231  BP: 98/60  Pulse: 70  Temp: 98 F (36.7 C)  Resp: 18   Filed Weights   01/24/15 1231  Weight: 147 lb 12.8 oz (67.042 kg)    GENERAL:alert, no distress and comfortable SKIN: skin color, texture, turgor are normal, no rashes or significant lesions EYES: normal, conjunctiva are pink and non-injected, sclera clear OROPHARYNX:no exudate, no erythema and lips, buccal mucosa, and tongue normal  NECK: supple, thyroid normal size, non-tender, without nodularity LYMPH:  no palpable lymphadenopathy in the cervical, axillary or inguinal LUNGS: clear to auscultation and percussion  with normal breathing effort HEART: regular rate & rhythm and no murmurs and no lower extremity edema ABDOMEN:abdomen soft, non-tender and normal bowel sounds Musculoskeletal:no cyanosis of digits and no clubbing  PSYCH: alert & oriented x 3 with fluent speech NEURO: sensory neuropathy  LABORATORY DATA:  I have reviewed the data as listed Lab Results  Component Value Date   WBC 4.4 01/07/2015   HGB 8.4* 01/07/2015   HCT 26.6* 01/07/2015   MCV 72.9* 01/07/2015   PLT 228 01/07/2015   Lab Results  Component Value Date   NA 142 01/21/2015   K 3.9 01/21/2015   CL 108 01/21/2015   CO2 21* 01/21/2015   ASSESSMENT AND PLAN:  Iron deficiency anemia Iron deficiency anemia long-standing in nature. Previous episode of duodenal AV malformation bleed. Most recent hospitalization 12/23/2014 she was found to have severe iron deficiency and received 2 doses of IV iron therapy. Stool Hemoccults were negative. Colonoscopy done 2015 was normal.  Recommendation: 1. Recheck CBC and iron studies. 2. If she is still iron deficient, we will set her up for 2 doses of IV iron therapy. 3. If she is not iron deficient, then I will see her back in 2 months with recheck of CBC and iron studies and follow-up. 4. I instructed her to discontinue oral iron therapy because I suspect there is a component of iron malabsorption causing her iron deficiency anemia.   Return to clinic based upon blood work to be done today.  All questions were answered. The patient knows to call the clinic with any problems, questions or concerns.    Rulon Eisenmenger, MD 1:30 PM

## 2015-01-24 NOTE — Progress Notes (Signed)
Addendum  Blood work was reviewed and I called the patient with the results Hemoglobin 8.6 with an MCV of 77, serum iron 39, TIBC 379 and saturation 10% ferritin is pending I discussed the results with her and recommended IV iron therapy. She will get 2 doses of IV Feraheme starting next Thursday.  Return to clinic in 3 months with labs including iron studies and follow-up. I instructed her to discontinue oral iron therapy.

## 2015-01-24 NOTE — Assessment & Plan Note (Signed)
Iron deficiency anemia long-standing in nature. Previous episode of duodenal AV malformation bleed. Most recent hospitalization 12/23/2014 she was found to have severe iron deficiency and received 2 doses of IV iron therapy. Stool Hemoccults were negative. Colonoscopy done 2015 was normal.  Recommendation: 1. Recheck CBC and iron studies. 2. If she is still iron deficient, we will set her up for 2 doses of IV iron therapy. 3. If she is not iron deficient, then I will see her back in 2 months with recheck of CBC and iron studies and follow-up. 4. I instructed her to discontinue oral iron therapy because I suspect there is a component of iron malabsorption causing her iron deficiency anemia.   Return to clinic based upon blood work to be done today.

## 2015-01-25 ENCOUNTER — Encounter: Payer: Self-pay | Admitting: *Deleted

## 2015-01-26 ENCOUNTER — Telehealth: Payer: Self-pay | Admitting: Hematology and Oncology

## 2015-01-26 NOTE — Telephone Encounter (Signed)
No voicemail on home number,left message on cell with appointments

## 2015-01-27 ENCOUNTER — Encounter: Payer: Self-pay | Admitting: *Deleted

## 2015-01-27 ENCOUNTER — Other Ambulatory Visit: Payer: Self-pay | Admitting: *Deleted

## 2015-01-27 LAB — ERYTHROPOIETIN: ERYTHROPOIETIN: 44.8 m[IU]/mL — AB (ref 2.6–18.5)

## 2015-01-27 NOTE — Patient Outreach (Signed)
Referral received from telephonic care manager, L. Tammi Klippel, for community care Freight forwarder involvement.  According to chart member has history of heart failure, diabetes, kidney disease and anemia.  Call placed to member to offer services.  Member answered phone, this care manager introduced self, and explained [facility] care management services.  Member states that she is not interested in services at this time, stating "I don't need nothing."  Benefits of services explained to member, she again declines.  Telephonic services offered to member, but she declines involvement.  Member also declines offer to mail information package for a future reference.  Will notify physician and care management assistant of choice not to become involved in services.  Valente David, BSN, [place] [facility]  [facility] Care Manager [phone]

## 2015-01-29 ENCOUNTER — Ambulatory Visit (HOSPITAL_BASED_OUTPATIENT_CLINIC_OR_DEPARTMENT_OTHER): Payer: Medicare Other

## 2015-01-29 VITALS — BP 110/56 | HR 78 | Temp 98.0°F | Resp 18

## 2015-01-29 DIAGNOSIS — D509 Iron deficiency anemia, unspecified: Secondary | ICD-10-CM

## 2015-01-29 MED ORDER — SODIUM CHLORIDE 0.9 % IV SOLN
Freq: Once | INTRAVENOUS | Status: AC
  Administered 2015-01-29: 11:00:00 via INTRAVENOUS

## 2015-01-29 MED ORDER — SODIUM CHLORIDE 0.9 % IV SOLN
510.0000 mg | Freq: Once | INTRAVENOUS | Status: AC
  Administered 2015-01-29: 510 mg via INTRAVENOUS
  Filled 2015-01-29: qty 17

## 2015-01-29 NOTE — Patient Instructions (Signed)

## 2015-01-30 ENCOUNTER — Encounter (HOSPITAL_COMMUNITY): Payer: Self-pay

## 2015-01-30 ENCOUNTER — Ambulatory Visit (HOSPITAL_COMMUNITY)
Admission: RE | Admit: 2015-01-30 | Discharge: 2015-01-30 | Disposition: A | Discharge status: Home / Self Care | Payer: Medicare Other | Source: Ambulatory Visit | Attending: Internal Medicine | Admitting: Internal Medicine

## 2015-01-30 ENCOUNTER — Encounter: Payer: Self-pay | Admitting: *Deleted

## 2015-01-30 ENCOUNTER — Other Ambulatory Visit: Payer: Medicare Other | Admitting: *Deleted

## 2015-01-30 VITALS — BP 84/42 | HR 72 | Wt 147.6 lb

## 2015-01-30 DIAGNOSIS — I509 Heart failure, unspecified: Secondary | ICD-10-CM | POA: Insufficient documentation

## 2015-01-30 DIAGNOSIS — N183 Chronic kidney disease, stage 3 (moderate): Secondary | ICD-10-CM | POA: Insufficient documentation

## 2015-01-30 DIAGNOSIS — I2721 Secondary pulmonary arterial hypertension: Secondary | ICD-10-CM

## 2015-01-30 DIAGNOSIS — I50810 Right heart failure, unspecified: Secondary | ICD-10-CM

## 2015-01-30 DIAGNOSIS — D509 Iron deficiency anemia, unspecified: Secondary | ICD-10-CM | POA: Insufficient documentation

## 2015-01-30 DIAGNOSIS — I4891 Unspecified atrial fibrillation: Secondary | ICD-10-CM | POA: Insufficient documentation

## 2015-01-30 DIAGNOSIS — I2781 Cor pulmonale (chronic): Secondary | ICD-10-CM | POA: Insufficient documentation

## 2015-01-30 DIAGNOSIS — Z79899 Other long term (current) drug therapy: Secondary | ICD-10-CM | POA: Diagnosis not present

## 2015-01-30 DIAGNOSIS — I313 Pericardial effusion (noninflammatory): Secondary | ICD-10-CM | POA: Insufficient documentation

## 2015-01-30 DIAGNOSIS — I129 Hypertensive chronic kidney disease with stage 1 through stage 4 chronic kidney disease, or unspecified chronic kidney disease: Secondary | ICD-10-CM | POA: Diagnosis not present

## 2015-01-30 DIAGNOSIS — I270 Primary pulmonary hypertension: Secondary | ICD-10-CM

## 2015-01-30 DIAGNOSIS — I480 Paroxysmal atrial fibrillation: Secondary | ICD-10-CM | POA: Diagnosis not present

## 2015-01-30 DIAGNOSIS — E039 Hypothyroidism, unspecified: Secondary | ICD-10-CM | POA: Diagnosis not present

## 2015-01-30 LAB — BASIC METABOLIC PANEL
Anion gap: 13 (ref 5–15)
BUN: 64 mg/dL — AB (ref 6–20)
CHLORIDE: 107 mmol/L (ref 101–111)
CO2: 24 mmol/L (ref 22–32)
CREATININE: 1.99 mg/dL — AB (ref 0.44–1.00)
Calcium: 9.5 mg/dL (ref 8.9–10.3)
GFR calc Af Amer: 30 mL/min — ABNORMAL LOW (ref >60)
GFR calc non Af Amer: 26 mL/min — ABNORMAL LOW (ref >60)
GLUCOSE: 70 mg/dL (ref 65–99)
Potassium: 4.4 mmol/L (ref 3.5–5.1)
SODIUM: 144 mmol/L (ref 135–145)

## 2015-01-30 LAB — BRAIN NATRIURETIC PEPTIDE: B NATRIURETIC PEPTIDE 5: 491.3 pg/mL — AB (ref 0.0–100.0)

## 2015-01-30 MED ORDER — METOLAZONE 2.5 MG PO TABS: 2.5000 mg | ORAL_TABLET | ORAL | Status: DC

## 2015-01-30 NOTE — Patient Outreach (Signed)
[place] [facility]) Care Management  01/30/2015  BETZABETH DERRINGER [birth year] [id number]   CSW was able to make initial contact with patient today to try and perform phone assessment, as well as assess and assist with social work needs and services; however, patient made it very clear to [place] that she was not interested in receiving any type of services through [employer] [facility].  CSW tried to introduce self, explain role and types of services provided through [place] [facility], but again, patient did not wish to hear what CSW had to say.  Shortly thereafter, patient terminated the call with CSW.  CSW will perform a case closure on patient, as patient reports not being interested in services at this time.  CSW will notify patient's RNCM with [place] [facility], Valente David of CSW's plans to close patient's case.  CSW will fax a case closure letter to patient's Primary Care Physician, Dr. Gilles Chiquito.  CSW will submit a case closure request to Lurline Del, Care Management Assistant with [place] [facility].  Nat Christen, BSW, MSW, LCSW  Licensed Education officer, [facility]  M[address] [place] [address] [place], [place] [address] Physical Address-[address], [place], [place] [address] Toll Free Main # [phone] Fax # [phone] Cell # [phone]  Fax # [phone]  [email or web address]

## 2015-01-30 NOTE — Progress Notes (Signed)
Patient ID: [name], female   DOB: [birth year], 64 y.o.   MRN: [id number] Patient ID: [name], female   DOB: Sep 08, 1950, 64 y.o.   MRN: [id number] P  Advanced Heart Failure Clinic Note   Patient ID: [name], female   DOB: [birth year], 65 y.o.   MRN: [id number] Pulmonary: Dr Lamonte Sakai  HF: McLean  HPI: Ms. Wojnar is a 64 yo female with a history of DM2, HLD, HTN, pHTN, GERD, gastroparesis, scleroderma, DJD and diastolic HF.   Of note she has been on several PAH targeted therapies and coumadin over last 9 years, but has not been reliably on therapy due to noncompliance and inability to have labs performed. She has had best response to Tyvaso (+ bosentan) with an improvement in 6 minute walk and in PASP from 102 mmHg (7/09) to 42 mmHg (9/10). After stopping Tyvaso her PASP rose to 80's by TTE 6/13 and 11/14. Surprisingly, she has never required supplemental O2. Macitentan was started in 11/14. She developed nausea and diarrhea and macitentan was stopped early 3/15. Most recently, she had been on Letairis and Adcirca (followed by Dr. Lamonte Sakai).   Admitted 10/14-10/18/15 for A/C RHF, volume overload and syncope. Diuresed with IV lasix and milrinone a total of 26 lbs. Discharge weight was 156 lbs and started on lasix 40 mg BID.   Admitted 12/29 with dyspnea and chest pain. BP was low, and Lasix and Adcirca were both initially held. Echo showed EF 60-65% with moderately dilated and dysfunctional RV, PASP 111 mmHg with moderate TR. There was a large pericardial effusion without definite tamponade. Presentation was suspected to be due to progressive PAH with secondary RV failure rather than tamponade. No pericardiocentesis yet. She was started on milrinone gtt and diuresed. Shorter-acting sildenafil was started instead of Adcirca. Also had GI bleed. 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 Discharge weight was 146 pounds.   Echo (1/16) with EF 55-60%,  severely dilated RV with moderately decreased systolic function, large pericardial effusion.   She was admitted in 2/16 with acute on chronic diastolic CHF/RV failure and hypotension.  Echo (2/16) showed EF 60%, moderate-severe RV dilation with moderate-severe RV systolic dysfunction, PA systolic pressure 62 mmHg, large pericardial effusion with borderline tamponade.  She had pericardiocentesis with improvement in symptoms.  No malignant cells in pericardial fluid.  Repeat echo showed no pericardial effusion.  She developed paroxysmal atrial fibrillation on 2/19 but went back into NSR on amiodarone.  She was not anticoagulated given history of GI bleeding and pericardial effusion.   She was again admitted in 10/16.  She had been out of meds x 1 week and gained 20 lbs.  She also was noted to be severely anemic with iron deficiency.  She was heme negative.  She got 2 units PRBCs.  She was diuresed extensively.  She was started on Levoxyl for hypothyroidism.   She returns today for followup. Last visit lasix stopped and switched to torsemide.  Weight at home down to 144 pounds. Mild dyspnea with exertion. Complaining of fatigue. Denies syncope/presyncope. Ongoing leg pain. Taking all medications.    6 min walk (05/16/14): 125 m 6 min walk (6/16): 146 m 6 min walk (8/16): 183 m  Echo (1/16) with EF 55-60%, severely dilated RA with moderately decreased systolic function, large pericardial effusion.  ECHO 05/09/2014: EF 60-65% Peak PA pressure 52  ECHO 06/05/14: EF 55-60%, RV moderately dilated with moderately decreased systolic function with  D-shaped interventricular septum. Severe RAE. RVSP 59. No pericardial effusion. Echo (8/16): EF 60-65%, moderate-severe RV dilation with mildly decreased RV systolic function, D-shaped interventricular septum, PASP 92, severe biatrial enlargement, no pericardial effusion.  Echo (10/16): EF 60-65%, D-shaped septum, RV moderately dilated, PASP 76 mmHg, severe TR.  ABIs  (11/16): Normal  RHC (1/16):  Hemodynamics (mmHg) RA mean 12 RV 64/14 PA 68/29, mean 42 PCWP mean 12 LV 102/14 AO 105/53 PA 67% AO 93% Cardiac Output (Fick) 6.63  Cardiac Index (Fick) 3.88  PVR 4.5 WU  Labs 03/24/2014 K 3.8 Creatinine 1.51 Hgb 9.3  Labs 2/16 BNP 121, K 4.4, creatinine 1.27, hgb 9.2 Labs 2/16 K 3.8, creatinine 1.58, AST 45, ALT 12 Labs 05/22/2014: Creatinine 2.1 K 3.7  Labs 06/05/2014: Creatinine 1.5 K 3.7, LFTs normal Labs 6/16: K 4.4, creatinine 1.82, hgb 8.8 Labs 10/16: K 4.4, creatinine 1.77 => 1.64, hgb 7.9 => 8.4, LFTs normal, Mg 1.6 01/21/2015: K 3.8 Creatinine 1.67    ROS: All systems negative except as listed in HPI, PMH and Problem List.  SH:  Social History   Social History  . Marital Status: Legally Separated    Spouse Name: N/A  . Number of Children: 2  . Years of Education: 11   Occupational History  . Umemployed   . Disability     Social History Main Topics  . Smoking status: Never Smoker   . Smokeless tobacco: Never Used  . Alcohol Use: No  . Drug Use: No  . Sexual Activity: Not on file   Other Topics Concern  . Not on file   Social History Narrative    FAMILY HISTORY:  Significant for coronary artery disease and diabetes   Patient lives at home with granddaughter.    Patient has 2 children.    Patient has 11 years of education.    Patient is on disability.    Patient is right handed.    Patient is separated.      FH:  Family History  Problem Relation Age of Onset  . Heart disease Mother   . Diabetes Mother   . Diabetes Sister     Past Medical History  Diagnosis Date  . Secondary pulmonary hypertension ([place])     a. 2/2 scleroderma  . GERD (gastroesophageal reflux disease)   . Systemic sclerosis ([place])   . Unspecified essential hypertension   . Gastroparesis   . Sickle cell trait ([place])   . Obesity   . Scleroderma ([place])   . Trichomonas   . Peripheral neuropathy ([place])   . Type II diabetes mellitus ([facility])     a.  diet control   . Arthritis   . Chronic kidney disease   . Gout   . Hypothyroidism     a. may be due to amiodarone use. started on synthroid on 12/2014 admission   . Anemia     a.  hx of GI bleed and duodenal AVMs  . Chronic diastolic CHF (congestive heart failure) ([place])     a. Echo 12/23/14 withEF 60-65%, moderately dilated RV, PASP 72, no pericardial effusion.  Marland Kitchen PAF (paroxysmal atrial fibrillation) (H[facility])     a. not a long term AC candidate due to GI bleeds    Current Outpatient Prescriptions  Medication Sig Dispense Refill  . ADCIRCA 20 MG TABS TAKE 2 TABLETS (40MG) BY MOUTH ONCE DAILY WITH OR WITHOUT FOOD. CALL [phone] FOR REFILLS. 60 tablet 11  . ambrisentan (LETAIRIS) 10 MG tablet Take 1 tablet (  10 mg total) by mouth daily. 30 tablet 11  . amiodarone (PACERONE) 200 MG tablet Take 1 tablet (200 mg total) by mouth daily. 30 tablet 6  . esomeprazole (NEXIUM) 40 MG capsule Take 40 mg by mouth 2 (two) times daily.     . fenofibrate 54 MG tablet Take 1 tablet (54 mg total) by mouth daily. 30 tablet 12  . ferrous sulfate ([employer] IRON) 325 (65 FE) MG tablet Take 325 mg by mouth daily with breakfast.    . FLUoxetine (PROZAC) 20 MG capsule take 1 capsule by mouth every morning 30 capsule 2  . HYDROcodone-acetaminophen (NORCO) 10-325 MG per tablet Take 1 tablet by mouth every 6 (six) hours as needed for moderate pain.     . hydrOXYzine (ATARAX/VISTARIL) 10 MG tablet Take 1 tablet (10 mg total) by mouth 2 (two) times daily as needed for itching. 20 tablet 0  . levothyroxine (SYNTHROID, LEVOTHROID) 25 MCG tablet Take 1 tablet (25 mcg total) by mouth daily before breakfast. 30 tablet 11  . magnesium oxide (MAG-OX) 400 MG tablet Take 400 mg by mouth 2 (two) times daily.    . nortriptyline (PAMELOR) 10 MG capsule Take 20 mg by mouth at bedtime.    . potassium chloride SA (K-DUR,KLOR-CON) 20 MEQ tablet Take 20 mEq by mouth daily.    . pregabalin (LYRICA) 150 MG capsule Take 1 capsule (150 mg  total) by mouth 3 (three) times daily. 90 capsule 5  . promethazine (PHENERGAN) 12.5 MG tablet Take 1 tablet (12.5 mg total) by mouth every 6 (six) hours as needed for nausea or vomiting. 45 tablet 1  . Selexipag (UPTRAVI) 1600 MCG TABS Take 1,600 mcg by mouth 2 (two) times daily. 60 tablet 3  . torsemide (DEMADEX) 20 MG tablet Take 3 tablets (60 mg total) by mouth 2 (two) times daily. 180 tablet 3   No current facility-administered medications for this encounter.    Filed Vitals:   01/30/15 1121  BP: 84/42  Pulse: 72  Weight: 147 lb 9.6 oz (66.951 kg)  SpO2: 97%    PHYSICAL EXAM:  General: Elderly and fatigued appearing. Neck: JVP 9-10 cm. No o thyromegaly or thyroid nodule. Lungs: diminished, bilateral crackles in bases. CV: RV heave. Heart regular S1/S2 with widely split S2,  2/6 HSM LLSB. 1+ edema 1/2 to knees bilaterally.  Unable to palpate pedal pulses.  Abdomen: no hepatosplenomegaly. Nontender, mildly distended. Neurologic: Alert and oriented x 3.  Psych: Affect flat. Extremities: No clubbing or cyanosis.   ASSESSMENT & PLAN: 1. 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  Echo 10/16  EF 60-65% with moderate dilated and mildly dysfunctional RV, severe TR. Pericardial effusion has resolved.   NYHA class III symptoms.   Volume status elevated. - Continue torsemide 60 mg twice a day.  Continue current KCl.  Add metolazone 2.5 mg once a week.  - BMET/BNP today  2. PAH with cor pulmonale: Severe PAH.  Had last [place] in 1/16. Pulmonary pressure remains elevated by echo in 10/16. - Continue ambrisentan 10 mg daily.  - Continue selexipag 1600 mcg bid.  - Continue Adcirca 40 mg daily.  - 6 minute walk today.   3. CKD III: Check BMET today.  4. 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  She is no longer anticoagulated.  5. Pericardial effusion: Resolved after pericardiocentesis, not seen on echo 12/2014.   6. Atrial fibrillation: NSR today.  Paroxysmal.  Continue amiodarone. She is  not anticoagulated due to h/o GI bleeding. LFTs normal in 10/16.  Recently started on Levoxyl, repeat TSH in 12/16. She will need a yearly eye exam while on amiodarone. 7.  Hypothyroidism: May be related to amiodarone.  She is now on Levoxyl 25 mcg daily.  TSH next visit.   8. Anemia: Fe-deficiency.  Heme negative in hospital.  She received IV iron in hospital and is now on po iron.  She has been referred to hematology for management of severe Fe-deficiency anemia (will need periodic IV Fe).   9. Leg pain: Etiology uncertain.  ?Neuropathy.  She is on Lyrica and Vicodin.  Arterial dopplers ok. Add mag 400 mg daily.    6 MW today   Follow up in 3 weeks with Dr Aundra Dubin. She will need TSH . Will need 6 MW.   Amy Clegg NP-C  01/30/2015 11:28 AM

## 2015-01-30 NOTE — Patient Instructions (Signed)
START Metolazone 2.5 mg, one tab weekly on Fridays  Labs today  Your physician recommends that you schedule a follow-up appointment in: 2 weeks with DR. McLean  Do the following things EVERYDAY: 1) Weigh yourself in the morning before breakfast. Write it down and keep it in a log. 2) Take your medicines as prescribed 3) Eat low salt foods-Limit salt (sodium) to 2000 mg per day.  4) Stay as active as you can everyday 5) Limit all fluids for the day to less than 2 liters 6)

## 2015-01-30 NOTE — Progress Notes (Signed)
6MW completed with patient, pt ambulated 460 ft (140.2 meters) HR ranged 70-111 O2 sats ranged from 84-95 %

## 2015-01-30 NOTE — Progress Notes (Signed)
Advanced Heart Failure Medication Review by a Pharmacist  Does the patient  feel that his/her medications are working for him/her?  yes  Has the patient been experiencing any side effects to the medications prescribed?  no  Does the patient measure his/her own blood pressure or blood glucose at home?  no   Does the patient have any problems obtaining medications due to transportation or finances?   no  Understanding of regimen: fair Understanding of indications: fair Potential of compliance: good Patient understands to avoid NSAIDs. Patient understands to avoid decongestants.  Issues to address at subsequent visits: None   Pharmacist comments:  Ms. Shafer is a pleasant 64 yo F presenting with a current medication list. She is not able to verbalize her regimen for me and states that she just takes whatever the pharmacy gives her. She reports good compliance with her medications and did not have any specific medication-related questions or concerns for me at this time.   Ruta Hinds. Velva Harman, PharmD, BCPS, CPP Clinical Pharmacist Pager: [phone] Phone: [phone] 01/30/2015 11:33 AM      Time with patient: 6 minutes Preparation and documentation time: 2 minutes Total time: 8 minutes

## 2015-02-04 ENCOUNTER — Encounter: Payer: Self-pay | Admitting: Student

## 2015-02-05 ENCOUNTER — Ambulatory Visit: Payer: Medicare Other

## 2015-02-12 ENCOUNTER — Encounter: Payer: Self-pay | Admitting: Internal Medicine

## 2015-02-13 ENCOUNTER — Other Ambulatory Visit: Payer: Self-pay | Admitting: Internal Medicine

## 2015-02-13 ENCOUNTER — Ambulatory Visit: Payer: Medicare Other | Admitting: Emergency Medicine

## 2015-02-13 ENCOUNTER — Telehealth: Payer: Self-pay | Admitting: Emergency Medicine

## 2015-02-13 NOTE — Telephone Encounter (Signed)
If we get a cancellation then we can put her in that slot

## 2015-02-13 NOTE — Telephone Encounter (Signed)
Called spoke with pt. She is scheduled at next available 04/17/14 w/ RB. She does not feel this acceptable and wants to be seen sooner. Pt refuses to see TP. She reports if RB will work her in then she will come in at anytime.  Please advise RB thanks

## 2015-02-14 NOTE — Telephone Encounter (Signed)
Spoke with pt. Advised her that I would call her if anyone cancels on RB's schedule in the AM. Nothing further was needed.

## 2015-02-20 ENCOUNTER — Emergency Department (HOSPITAL_COMMUNITY): Payer: Medicare Other

## 2015-02-20 ENCOUNTER — Encounter (HOSPITAL_COMMUNITY): Payer: Self-pay | Admitting: Emergency Medicine

## 2015-02-20 ENCOUNTER — Inpatient Hospital Stay (HOSPITAL_COMMUNITY)
Admission: EM | Admit: 2015-02-20 | Discharge: 2015-02-26 | DRG: 377 | Disposition: A | Discharge status: Home Health | Payer: Medicare Other | Attending: Internal Medicine | Admitting: Internal Medicine

## 2015-02-20 DIAGNOSIS — I2721 Secondary pulmonary arterial hypertension: Secondary | ICD-10-CM

## 2015-02-20 DIAGNOSIS — D62 Acute posthemorrhagic anemia: Secondary | ICD-10-CM | POA: Diagnosis not present

## 2015-02-20 DIAGNOSIS — D649 Anemia, unspecified: Secondary | ICD-10-CM | POA: Diagnosis present

## 2015-02-20 DIAGNOSIS — Z90710 Acquired absence of both cervix and uterus: Secondary | ICD-10-CM

## 2015-02-20 DIAGNOSIS — I130 Hypertensive heart and chronic kidney disease with heart failure and stage 1 through stage 4 chronic kidney disease, or unspecified chronic kidney disease: Secondary | ICD-10-CM | POA: Diagnosis not present

## 2015-02-20 DIAGNOSIS — E785 Hyperlipidemia, unspecified: Secondary | ICD-10-CM | POA: Diagnosis present

## 2015-02-20 DIAGNOSIS — I959 Hypotension, unspecified: Secondary | ICD-10-CM | POA: Diagnosis not present

## 2015-02-20 DIAGNOSIS — E1142 Type 2 diabetes mellitus with diabetic polyneuropathy: Secondary | ICD-10-CM | POA: Diagnosis present

## 2015-02-20 DIAGNOSIS — E1143 Type 2 diabetes mellitus with diabetic autonomic (poly)neuropathy: Secondary | ICD-10-CM | POA: Diagnosis not present

## 2015-02-20 DIAGNOSIS — Z91041 Radiographic dye allergy status: Secondary | ICD-10-CM | POA: Diagnosis not present

## 2015-02-20 DIAGNOSIS — M1990 Unspecified osteoarthritis, unspecified site: Secondary | ICD-10-CM | POA: Diagnosis present

## 2015-02-20 DIAGNOSIS — J189 Pneumonia, unspecified organism: Secondary | ICD-10-CM | POA: Diagnosis not present

## 2015-02-20 DIAGNOSIS — Z96652 Presence of left artificial knee joint: Secondary | ICD-10-CM | POA: Diagnosis present

## 2015-02-20 DIAGNOSIS — D696 Thrombocytopenia, unspecified: Secondary | ICD-10-CM | POA: Diagnosis not present

## 2015-02-20 DIAGNOSIS — Z79899 Other long term (current) drug therapy: Secondary | ICD-10-CM | POA: Diagnosis not present

## 2015-02-20 DIAGNOSIS — R1084 Generalized abdominal pain: Secondary | ICD-10-CM | POA: Diagnosis not present

## 2015-02-20 DIAGNOSIS — R64 Cachexia: Secondary | ICD-10-CM | POA: Diagnosis present

## 2015-02-20 DIAGNOSIS — K210 Gastro-esophageal reflux disease with esophagitis: Secondary | ICD-10-CM | POA: Diagnosis present

## 2015-02-20 DIAGNOSIS — D573 Sickle-cell trait: Secondary | ICD-10-CM | POA: Diagnosis present

## 2015-02-20 DIAGNOSIS — E1122 Type 2 diabetes mellitus with diabetic chronic kidney disease: Secondary | ICD-10-CM | POA: Diagnosis present

## 2015-02-20 DIAGNOSIS — I480 Paroxysmal atrial fibrillation: Secondary | ICD-10-CM | POA: Diagnosis not present

## 2015-02-20 DIAGNOSIS — Z885 Allergy status to narcotic agent status: Secondary | ICD-10-CM | POA: Diagnosis not present

## 2015-02-20 DIAGNOSIS — E7522 Gaucher disease: Secondary | ICD-10-CM | POA: Diagnosis not present

## 2015-02-20 DIAGNOSIS — E039 Hypothyroidism, unspecified: Secondary | ICD-10-CM | POA: Diagnosis present

## 2015-02-20 DIAGNOSIS — D509 Iron deficiency anemia, unspecified: Secondary | ICD-10-CM | POA: Diagnosis not present

## 2015-02-20 DIAGNOSIS — I5042 Chronic combined systolic (congestive) and diastolic (congestive) heart failure: Secondary | ICD-10-CM | POA: Diagnosis present

## 2015-02-20 DIAGNOSIS — N183 Chronic kidney disease, stage 3 unspecified: Secondary | ICD-10-CM | POA: Diagnosis present

## 2015-02-20 DIAGNOSIS — I5033 Acute on chronic diastolic (congestive) heart failure: Secondary | ICD-10-CM | POA: Diagnosis not present

## 2015-02-20 DIAGNOSIS — I10 Essential (primary) hypertension: Secondary | ICD-10-CM | POA: Diagnosis present

## 2015-02-20 DIAGNOSIS — K5521 Angiodysplasia of colon with hemorrhage: Principal | ICD-10-CM | POA: Diagnosis present

## 2015-02-20 DIAGNOSIS — E861 Hypovolemia: Secondary | ICD-10-CM | POA: Diagnosis present

## 2015-02-20 DIAGNOSIS — K625 Hemorrhage of anus and rectum: Secondary | ICD-10-CM

## 2015-02-20 DIAGNOSIS — J9811 Atelectasis: Secondary | ICD-10-CM | POA: Diagnosis not present

## 2015-02-20 DIAGNOSIS — K648 Other hemorrhoids: Secondary | ICD-10-CM | POA: Diagnosis not present

## 2015-02-20 DIAGNOSIS — I951 Orthostatic hypotension: Secondary | ICD-10-CM | POA: Diagnosis present

## 2015-02-20 DIAGNOSIS — M349 Systemic sclerosis, unspecified: Secondary | ICD-10-CM | POA: Diagnosis not present

## 2015-02-20 DIAGNOSIS — R079 Chest pain, unspecified: Secondary | ICD-10-CM | POA: Diagnosis not present

## 2015-02-20 DIAGNOSIS — I272 Other secondary pulmonary hypertension: Secondary | ICD-10-CM | POA: Diagnosis present

## 2015-02-20 DIAGNOSIS — M109 Gout, unspecified: Secondary | ICD-10-CM | POA: Diagnosis present

## 2015-02-20 DIAGNOSIS — D500 Iron deficiency anemia secondary to blood loss (chronic): Secondary | ICD-10-CM | POA: Diagnosis present

## 2015-02-20 DIAGNOSIS — Z881 Allergy status to other antibiotic agents status: Secondary | ICD-10-CM

## 2015-02-20 DIAGNOSIS — E1121 Type 2 diabetes mellitus with diabetic nephropathy: Secondary | ICD-10-CM | POA: Diagnosis present

## 2015-02-20 DIAGNOSIS — K922 Gastrointestinal hemorrhage, unspecified: Secondary | ICD-10-CM | POA: Diagnosis present

## 2015-02-20 DIAGNOSIS — N179 Acute kidney failure, unspecified: Secondary | ICD-10-CM | POA: Diagnosis not present

## 2015-02-20 DIAGNOSIS — I2781 Cor pulmonale (chronic): Secondary | ICD-10-CM | POA: Diagnosis present

## 2015-02-20 DIAGNOSIS — I509 Heart failure, unspecified: Secondary | ICD-10-CM | POA: Diagnosis not present

## 2015-02-20 DIAGNOSIS — R109 Unspecified abdominal pain: Secondary | ICD-10-CM

## 2015-02-20 DIAGNOSIS — K921 Melena: Secondary | ICD-10-CM | POA: Diagnosis not present

## 2015-02-20 DIAGNOSIS — R197 Diarrhea, unspecified: Secondary | ICD-10-CM | POA: Diagnosis not present

## 2015-02-20 HISTORY — DX: Angiodysplasia of colon with hemorrhage: K55.21

## 2015-02-20 LAB — URINALYSIS, ROUTINE W REFLEX MICROSCOPIC
BILIRUBIN URINE: NEGATIVE
Glucose, UA: NEGATIVE mg/dL
KETONES UR: NEGATIVE mg/dL
Nitrite: NEGATIVE
PROTEIN: NEGATIVE mg/dL
Specific Gravity, Urine: 1.007 (ref 1.005–1.030)
pH: 7 (ref 5.0–8.0)

## 2015-02-20 LAB — CBC
HCT: 17.4 % — ABNORMAL LOW (ref 36.0–46.0)
Hemoglobin: 5.5 g/dL — CL (ref 12.0–15.0)
MCH: 24.7 pg — AB (ref 26.0–34.0)
MCHC: 31.6 g/dL (ref 30.0–36.0)
MCV: 78 fL (ref 78.0–100.0)
PLATELETS: 178 10*3/uL (ref 150–400)
RBC: 2.23 MIL/uL — AB (ref 3.87–5.11)
RDW: 25.5 % — AB (ref 11.5–15.5)
WBC: 9.7 10*3/uL (ref 4.0–10.5)

## 2015-02-20 LAB — COMPREHENSIVE METABOLIC PANEL
ALK PHOS: 78 U/L (ref 38–126)
ALT: 11 U/L — AB (ref 14–54)
AST: 22 U/L (ref 15–41)
Albumin: 3.1 g/dL — ABNORMAL LOW (ref 3.5–5.0)
Anion gap: 13 (ref 5–15)
BUN: 116 mg/dL — ABNORMAL HIGH (ref 6–20)
CALCIUM: 9 mg/dL (ref 8.9–10.3)
CO2: 24 mmol/L (ref 22–32)
CREATININE: 2.21 mg/dL — AB (ref 0.44–1.00)
Chloride: 107 mmol/L (ref 101–111)
GFR calc non Af Amer: 22 mL/min — ABNORMAL LOW (ref >60)
GFR, EST AFRICAN AMERICAN: 26 mL/min — AB (ref >60)
GLUCOSE: 118 mg/dL — AB (ref 65–99)
Potassium: 4.5 mmol/L (ref 3.5–5.1)
SODIUM: 144 mmol/L (ref 135–145)
Total Bilirubin: 0.3 mg/dL (ref 0.3–1.2)
Total Protein: 6.5 g/dL (ref 6.5–8.1)

## 2015-02-20 LAB — URINE MICROSCOPIC-ADD ON

## 2015-02-20 LAB — I-STAT TROPONIN, ED: TROPONIN I, POC: 0.01 ng/mL (ref 0.00–0.08)

## 2015-02-20 LAB — POC OCCULT BLOOD, ED: Fecal Occult Bld: POSITIVE — AB

## 2015-02-20 LAB — PREPARE RBC (CROSSMATCH)

## 2015-02-20 LAB — LIPASE, BLOOD: LIPASE: 29 U/L (ref 11–51)

## 2015-02-20 MED ORDER — SODIUM CHLORIDE 0.9 % IV SOLN
80.0000 mg | Freq: Once | INTRAVENOUS | Status: AC
  Administered 2015-02-20: 80 mg via INTRAVENOUS
  Filled 2015-02-20: qty 80

## 2015-02-20 MED ORDER — SODIUM CHLORIDE 0.9 % IV SOLN
10.0000 mL/h | Freq: Once | INTRAVENOUS | Status: AC
  Administered 2015-02-20: 10 mL/h via INTRAVENOUS

## 2015-02-20 MED ORDER — DEXTROSE 5 % IV SOLN
100.0000 mg | Freq: Once | INTRAVENOUS | Status: AC
  Administered 2015-02-21: 100 mg via INTRAVENOUS
  Filled 2015-02-20: qty 100

## 2015-02-20 NOTE — ED Notes (Signed)
Pt states she started having bright red rectal bleeding this am, pt states she has been in bed all day because she became dizzy when trying to stand. Pt also states she had some intermittent chest pain earlier today with none at this time.  Pt is alert and ox4. Pt states she just doesn't feel well.

## 2015-02-20 NOTE — ED Provider Notes (Signed)
CSN: [id number]     Arrival date & time 02/20/15  2034 History   First MD Initiated Contact with Patient 02/20/15 2053     Chief Complaint  Patient presents with  . Rectal Bleeding     (Consider location/radiation/quality/duration/timing/severity/associated sxs/prior Treatment) Patient is a 64 y.o. female presenting with hematochezia. The history is provided by the patient.  Rectal Bleeding Quality:  [place] Amount:  Moderate Duration:  1 day Timing:  Intermittent Progression:  Unchanged Chronicity:  Recurrent Context: hemorrhoids   Context: not rectal pain   Similar prior episodes: yes   Associated symptoms: abdominal pain   Associated symptoms: no hematemesis and no loss of consciousness   Risk factors: no anticoagulant use     Past Medical History  Diagnosis Date  . Secondary pulmonary hypertension ([place])     a. 2/2 scleroderma  . GERD (gastroesophageal reflux disease)   . Systemic sclerosis ([place])   . Unspecified essential hypertension   . Gastroparesis   . Sickle cell trait ([place])   . Obesity   . Scleroderma ([place])   . Trichomonas   . Peripheral neuropathy ([place])   . Type II diabetes mellitus (H[facility])     a. diet control   . Arthritis   . Chronic kidney disease   . Gout   . Hypothyroidism     a. may be due to amiodarone use. started on synthroid on 12/2014 admission   . Anemia     a.  hx of GI bleed and duodenal AVMs  . Chronic diastolic CHF (congestive heart failure) ([place])     a. Echo 12/23/14 withEF 60-65%, moderately dilated RV, PASP 72, no pericardial effusion.  Marland Kitchen PAF (paroxysmal atrial fibrillation) (HCC)     a. not a long term AC candidate due to GI bleeds   Past Surgical History  Procedure Laterality Date  . Tubal ligation    . Esophagogastroduodenoscopy  02/23/2010    D 2 AVM, ablated with APC.   Marland Kitchen Shoulder arthroscopy Right 12/2009    subacromial decompression  . Replacement total knee Left 11/2000  . Cardiac catheterization Right 04/24/2004  .  Tonsillectomy  1960's  . Abdominal hysterectomy  1983    "partial"  . Shoulder arthroscopy w/ rotator cuff repair Right 01/2010  . Excisional total knee arthroplasty with antibiotic spacers Left 08/2010    "got infected & had to take 1st replacement out"  . Revision total knee arthroplasty Left 11/2010    "removed spacers; replaced knee"  . Total knee revision with scar debridement/patella revision with poly exchange Left 12/2010    fell; knee split opened; had to redo revision"  . Cardiac catheterization Left 07/2010  . Peripherally inserted central catheter insertion  09/2010  . Colonoscopy  09/06/2008, 09/2013    int rrhoids:Dr. Lajoyce Corners 2010. Pan-colonic AVMs, microscopic colitis per Dr Deatra Ina 2015  . Right heart catheterization N/A 03/18/2014    Procedure: RIGHT HEART CATH;  Surgeon: Larey Dresser, MD;  Location: [facility] CATH LAB;  Service: Cardiovascular;  Laterality: N/A;  . Esophagogastroduodenoscopy N/A 03/23/2014    Procedure: ESOPHAGOGASTRODUODENOSCOPY (EGD);  Surgeon: Ladene Artist, MD;  Location: [facility] ENDOSCOPY;  Service: Endoscopy;  Laterality: N/A;  . Pericardial tap N/A 05/03/2014    Procedure: PERICARDIAL TAP;  Surgeon: Sinclair Grooms, MD;  Location: [facility] CATH LAB;  Service: Cardiovascular;  Laterality: N/A;   Family History  Problem Relation Age of Onset  . Heart disease Mother   . Diabetes Mother   . Diabetes Sister  Social History  Substance Use Topics  . Smoking status: Never Smoker   . Smokeless tobacco: Never Used  . Alcohol Use: No   OB History    Gravida Para Term Preterm AB TAB SAB Ectopic Multiple Living   _0 Review of Systems  Constitutional: Positive for fatigue.  Cardiovascular: Positive for chest pain.  Gastrointestinal: Positive for abdominal pain, blood in stool and hematochezia. Negative for hematemesis.  Neurological: Negative for loss of consciousness.  Hematological: Does not bruise/bleed easily.  All other systems reviewed and are  negative.     Allergies  Cephalexin; Ciprofloxacin; Codeine; Contrast media; and Iohexol  Home Medications   Prior to Admission medications   Medication Sig Start Date End Date Taking? Authorizing Provider  ADCIRCA 20 MG TABS TAKE 2 TABLETS (40MG) BY MOUTH ONCE DAILY WITH OR WITHOUT FOOD. CALL [phone] FOR REFILLS. 06/13/14  Yes Collene Gobble, MD  ambrisentan (LETAIRIS) 10 MG tablet Take 1 tablet (10 mg total) by mouth daily. 11/25/14  Yes Collene Gobble, MD  amiodarone (PACERONE) 200 MG tablet Take 1 tablet (200 mg total) by mouth daily. 12/17/14  Yes Larey Dresser, MD  esomeprazole (NEXIUM) 40 MG capsule Take 40 mg by mouth 2 (two) times daily.    Yes Historical Provider, MD  fenofibrate 54 MG tablet Take 1 tablet (54 mg total) by mouth daily. 03/25/14  Yes Erick Colace, NP  FLUoxetine (PROZAC) 20 MG capsule take 1 capsule by mouth every morning 02/14/15  Yes Sid Falcon, MD  HYDROcodone-acetaminophen [facility]) 10-325 MG per tablet Take 1 tablet by mouth every 6 (six) hours as needed for moderate pain.  07/17/12  Yes Historical Provider, MD  levothyroxine (SYNTHROID, LEVOTHROID) 25 MCG tablet Take 1 tablet (25 mcg total) by mouth daily before breakfast. 12/28/14  Yes Eileen Stanford, PA-C  magnesium oxide (MAG-OX) 400 MG tablet Take 400 mg by mouth 2 (two) times daily.   Yes Historical Provider, MD  metolazone (ZAROXOLYN) 2.5 MG tablet Take 1 tablet (2.5 mg total) by mouth once a week. On Fridays 01/30/15  Yes Amy D Clegg, NP  nortriptyline (PAMELOR) 10 MG capsule Take 20 mg by mouth at bedtime.   Yes Historical Provider, MD  potassium chloride SA (K-DUR,KLOR-CON) 20 MEQ tablet Take 20 mEq by mouth daily.   Yes Historical Provider, MD  pregabalin (LYRICA) 150 MG capsule Take 1 capsule (150 mg total) by mouth 3 (three) times daily. 08/19/14  Yes Marcial Pacas, MD  Selexipag (UPTRAVI) 1600 MCG TABS Take 1,600 mcg by mouth 2 (two) times daily. 10/22/14  Yes Jolaine Artist, MD  torsemide  (DEMADEX) 20 MG tablet Take 3 tablets (60 mg total) by mouth 2 (two) times daily. 01/21/15  Yes Amy D Clegg, NP   BP 90/42 mmHg  Pulse 76  Temp(Src) 98.8 F (37.1 C) (Oral)  Resp 13  Ht 5' 3" (1.6 m)  Wt 147 lb (66.679 kg)  BMI 26.05 kg/m2  SpO2 100% Physical Exam  Constitutional: She is oriented to person, place, and time. She appears well-developed.  HENT:  Head: Normocephalic and atraumatic.  Eyes: Conjunctivae and EOM are normal. Pupils are equal, round, and reactive to light.  Neck: Normal range of motion. Neck supple.  Cardiovascular: Normal rate, regular rhythm and normal heart sounds.   Pulmonary/Chest: Effort normal and breath sounds normal. No respiratory distress. She has no wheezes.  Abdominal: Soft. Bowel sounds  are normal. She exhibits no distension. There is tenderness. There is guarding. There is no rebound.  Diffuse tenderness of the abdomen with grossly bloody stool on rectal exam  Neurological: She is alert and oriented to person, place, and time.  Skin: Skin is warm and dry.  Nursing note and vitals reviewed.   ED Course  Procedures (including critical care time) Labs Review Labs Reviewed  COMPREHENSIVE METABOLIC PANEL - Abnormal; Notable for the following:    Glucose, Bld 118 (*)    BUN 116 (*)    Creatinine, Ser 2.21 (*)    Albumin 3.1 (*)    ALT 11 (*)    GFR calc non Af Amer 22 (*)    GFR calc Af Amer 26 (*)    All other components within normal limits  CBC - Abnormal; Notable for the following:    RBC 2.23 (*)    Hemoglobin 5.5 (*)    HCT 17.4 (*)    MCH 24.7 (*)    RDW 25.5 (*)    All other components within normal limits  URINALYSIS, ROUTINE W REFLEX MICROSCOPIC (NOT AT [facility]) - Abnormal; Notable for the following:    Hgb urine dipstick LARGE (*)    Leukocytes, UA TRACE (*)    All other components within normal limits  URINE MICROSCOPIC-ADD ON - Abnormal; Notable for the following:    Squamous Epithelial / LPF 0-5 (*)    Bacteria, UA RARE  (*)    All other components within normal limits  POC OCCULT BLOOD, ED - Abnormal; Notable for the following:    Fecal Occult Bld POSITIVE (*)    All other components within normal limits  LIPASE, BLOOD  I-STAT TROPOININ, ED  TYPE AND SCREEN  PREPARE RBC (CROSSMATCH)    Imaging Review Dg Chest Port 1 View  02/20/2015  CLINICAL DATA:  Acute onset of generalized chest pain and anemia. Generalized weakness. Initial encounter. EXAM: PORTABLE CHEST 1 VIEW COMPARISON:  Chest radiograph performed 12/24/2014 FINDINGS: The lungs are well-aerated. Mild right basilar and left midlung airspace opacities raise concern for mild pneumonia. There is no evidence of pleural effusion or pneumothorax. The cardiomediastinal silhouette is borderline enlarged. No acute osseous abnormalities are seen. IMPRESSION: Mild right basilar and left midlung airspace opacities raise concern for mild pneumonia. Borderline cardiomegaly. Electronically Signed   By: Garald Balding M.D.   On: 02/20/2015 22:25   I have personally reviewed and evaluated these images and lab results as part of my medical decision-making.   EKG Interpretation None     ED ECG REPORT   Date: 02/20/2015  Rate: 80  Rhythm: normal sinus rhythm  QRS Axis: normal  Intervals: normal  ST/T Wave abnormalities: nonspecific ST/T changes  Conduction Disutrbances:nonspecific intraventricular conduction delay  Narrative Interpretation:   Old EKG Reviewed: unchanged  I have personally reviewed the EKG tracing and agree with the computerized printout as noted.  MDM   Final diagnoses:  Abdominal pain in female  BRBPR (bright red blood per rectum)  Symptomatic anemia  Acute blood loss anemia  CAP (community acquired pneumonia)      64 year old 55 female with history of pulmonary hypertension, diabetes, gastroparesis, systemic sclerosis, esophageal stricture, sickle cell trait, congestive heart failure comes in with cc of lower GI bleed.  Her colonoscopy and endoscopy results were reviewed, and we noted that pt has known AVM diffusely and also internal hemorrhoids. She likely has a LGIB, but with high BUN, UGIB is also possible.  Pt's Hb  is 5.5. She will be transfused, she is tired and fatigued.   BP has been in the 90s SBP - no tachycardia. Will start gentle hydration whilst she is awaiting blood products. Spoke w/ Dr. Hilarie Fredrickson GI - who will hav his team see the patient in the AM.  Pt also has some chest discomfort. Trop is neg. CXR shows ? PNA. Spoke with Dr. Tamala Julian, Hospitalist - he would prefer to get antibiotics on board, and i think it is better to be aggressive at the offset.  Finally pt has diffuse abd tenderness, worst in the lower quadrant. No uti like sx. No vaginal bleeding or d/c. With her DM, autoimmune condition - we will get a CT non contrast study.  CRITICAL CARE Performed by: Varney Biles   Total critical care time: 55 minutes  Critical care time was exclusive of separately billable procedures and treating other patients.  Critical care was necessary to treat or prevent imminent or life-threatening deterioration.  Critical care was time spent personally by me on the following activities: development of treatment plan with patient and/or surrogate as well as nursing, discussions with consultants, evaluation of patient's response to treatment, examination of patient, obtaining history from patient or surrogate, ordering and performing treatments and interventions, ordering and review of laboratory studies, ordering and review of radiographic studies, pulse oximetry and re-evaluation of patient's condition.   Varney Biles, MD 02/20/15 [phone]

## 2015-02-20 NOTE — ED Notes (Signed)
Pharmacy notified on pt.'s blood transfusion order.

## 2015-02-20 NOTE — ED Notes (Signed)
Patient transported to CT scan.

## 2015-02-21 ENCOUNTER — Encounter (HOSPITAL_COMMUNITY): Payer: Medicare Other

## 2015-02-21 ENCOUNTER — Encounter (HOSPITAL_COMMUNITY): Payer: Self-pay | Admitting: Anesthesiology

## 2015-02-21 ENCOUNTER — Encounter (HOSPITAL_COMMUNITY)
Admission: EM | Disposition: A | Discharge status: Home Health | Payer: Self-pay | Source: Home / Self Care | Attending: Internal Medicine

## 2015-02-21 DIAGNOSIS — K921 Melena: Secondary | ICD-10-CM

## 2015-02-21 DIAGNOSIS — D62 Acute posthemorrhagic anemia: Secondary | ICD-10-CM

## 2015-02-21 DIAGNOSIS — D500 Iron deficiency anemia secondary to blood loss (chronic): Secondary | ICD-10-CM

## 2015-02-21 DIAGNOSIS — I272 Other secondary pulmonary hypertension: Secondary | ICD-10-CM

## 2015-02-21 DIAGNOSIS — M349 Systemic sclerosis, unspecified: Secondary | ICD-10-CM

## 2015-02-21 DIAGNOSIS — D649 Anemia, unspecified: Secondary | ICD-10-CM | POA: Diagnosis present

## 2015-02-21 DIAGNOSIS — I10 Essential (primary) hypertension: Secondary | ICD-10-CM

## 2015-02-21 DIAGNOSIS — E1142 Type 2 diabetes mellitus with diabetic polyneuropathy: Secondary | ICD-10-CM

## 2015-02-21 DIAGNOSIS — K922 Gastrointestinal hemorrhage, unspecified: Secondary | ICD-10-CM

## 2015-02-21 DIAGNOSIS — I959 Hypotension, unspecified: Secondary | ICD-10-CM

## 2015-02-21 HISTORY — PX: ESOPHAGOGASTRODUODENOSCOPY: SHX5428

## 2015-02-21 LAB — BASIC METABOLIC PANEL
ANION GAP: 10 (ref 5–15)
BUN: 97 mg/dL — ABNORMAL HIGH (ref 6–20)
CO2: 25 mmol/L (ref 22–32)
Calcium: 8.9 mg/dL (ref 8.9–10.3)
Chloride: 110 mmol/L (ref 101–111)
Creatinine, Ser: 2.1 mg/dL — ABNORMAL HIGH (ref 0.44–1.00)
GFR, EST AFRICAN AMERICAN: 28 mL/min — AB (ref >60)
GFR, EST NON AFRICAN AMERICAN: 24 mL/min — AB (ref >60)
Glucose, Bld: 97 mg/dL (ref 65–99)
POTASSIUM: 3.5 mmol/L (ref 3.5–5.1)
Sodium: 145 mmol/L (ref 135–145)

## 2015-02-21 LAB — CBC
HCT: 21.6 % — ABNORMAL LOW (ref 36.0–46.0)
HEMOGLOBIN: 7.1 g/dL — AB (ref 12.0–15.0)
MCH: 26.8 pg (ref 26.0–34.0)
MCHC: 32.9 g/dL (ref 30.0–36.0)
MCV: 81.5 fL (ref 78.0–100.0)
Platelets: 129 10*3/uL — ABNORMAL LOW (ref 150–400)
RBC: 2.65 MIL/uL — AB (ref 3.87–5.11)
RDW: 18.5 % — ABNORMAL HIGH (ref 11.5–15.5)
WBC: 7.7 10*3/uL (ref 4.0–10.5)

## 2015-02-21 LAB — MRSA PCR SCREENING: MRSA BY PCR: NEGATIVE

## 2015-02-21 LAB — PREPARE RBC (CROSSMATCH)

## 2015-02-21 LAB — TSH: TSH: 3.314 u[IU]/mL (ref 0.350–4.500)

## 2015-02-21 SURGERY — EGD (ESOPHAGOGASTRODUODENOSCOPY)
Anesthesia: Moderate Sedation

## 2015-02-21 MED ORDER — PANTOPRAZOLE SODIUM 40 MG PO TBEC: 40.0000 mg | DELAYED_RELEASE_TABLET | Freq: Every day | ORAL | Status: DC

## 2015-02-21 MED ORDER — MIDAZOLAM HCL 5 MG/ML IJ SOLN
INTRAMUSCULAR | Status: AC
  Filled 2015-02-21: qty 2

## 2015-02-21 MED ORDER — DIPHENHYDRAMINE HCL 50 MG/ML IJ SOLN
INTRAMUSCULAR | Status: AC
  Filled 2015-02-21: qty 1

## 2015-02-21 MED ORDER — MIDAZOLAM HCL 10 MG/2ML IJ SOLN
INTRAMUSCULAR | Status: DC | PRN
  Administered 2015-02-21: 2 mg via INTRAVENOUS

## 2015-02-21 MED ORDER — PREGABALIN 75 MG PO CAPS
150.0000 mg | ORAL_CAPSULE | Freq: Three times a day (TID) | ORAL | Status: DC
  Administered 2015-02-21 – 2015-02-26 (×13): 150 mg via ORAL
  Filled 2015-02-21 (×2): qty 3
  Filled 2015-02-21: qty 2
  Filled 2015-02-21 (×4): qty 3
  Filled 2015-02-21 (×2): qty 2
  Filled 2015-02-21 (×4): qty 3

## 2015-02-21 MED ORDER — SODIUM CHLORIDE 0.9 % IV SOLN: INTRAVENOUS | Status: DC

## 2015-02-21 MED ORDER — FENTANYL CITRATE (PF) 100 MCG/2ML IJ SOLN
INTRAMUSCULAR | Status: AC
  Filled 2015-02-21: qty 2

## 2015-02-21 MED ORDER — SODIUM CHLORIDE 0.9 % IV SOLN: Freq: Once | INTRAVENOUS | Status: DC

## 2015-02-21 MED ORDER — DIPHENHYDRAMINE HCL 50 MG/ML IJ SOLN
INTRAMUSCULAR | Status: DC | PRN
  Administered 2015-02-21: 25 mg via INTRAVENOUS

## 2015-02-21 MED ORDER — SODIUM CHLORIDE 0.9 % IJ SOLN
3.0000 mL | Freq: Two times a day (BID) | INTRAMUSCULAR | Status: DC
  Administered 2015-02-21 – 2015-02-26 (×10): 3 mL via INTRAVENOUS

## 2015-02-21 MED ORDER — LACTATED RINGERS IV SOLN
INTRAVENOUS | Status: AC
  Administered 2015-02-21: 05:00:00 via INTRAVENOUS

## 2015-02-21 MED ORDER — HYDROCODONE-ACETAMINOPHEN 10-325 MG PO TABS
1.0000 | ORAL_TABLET | Freq: Four times a day (QID) | ORAL | Status: DC | PRN
  Administered 2015-02-21 – 2015-02-25 (×7): 1 via ORAL
  Filled 2015-02-21 (×7): qty 1

## 2015-02-21 MED ORDER — BUTAMBEN-TETRACAINE-BENZOCAINE 2-2-14 % EX AERO
INHALATION_SPRAY | CUTANEOUS | Status: DC | PRN
Start: 2015-02-21 — End: 2015-02-21
  Administered 2015-02-21: 1 via TOPICAL

## 2015-02-21 MED ORDER — SELEXIPAG 1600 MCG PO TABS
1600.0000 ug | ORAL_TABLET | Freq: Two times a day (BID) | ORAL | Status: DC
  Administered 2015-02-21 – 2015-02-24 (×7): 1600 ug via ORAL
  Administered 2015-02-25: 1600 ug/mL via ORAL
  Administered 2015-02-25 – 2015-02-26 (×2): 1600 ug via ORAL
  Filled 2015-02-21 (×4): qty 1

## 2015-02-21 MED ORDER — SODIUM CHLORIDE 0.9 % IV SOLN
INTRAVENOUS | Status: AC
  Administered 2015-02-21: 09:00:00 via INTRAVENOUS
  Administered 2015-02-21: 500 mL via INTRAVENOUS

## 2015-02-21 MED ORDER — ALBUTEROL SULFATE (2.5 MG/3ML) 0.083% IN NEBU: 2.5000 mg | INHALATION_SOLUTION | RESPIRATORY_TRACT | Status: DC | PRN

## 2015-02-21 MED ORDER — PANTOPRAZOLE SODIUM 40 MG IV SOLR: 40.0000 mg | Freq: Two times a day (BID) | INTRAVENOUS | Status: DC

## 2015-02-21 NOTE — Op Note (Addendum)
[facility] [place], [id number]   ENDOSCOPY PROCEDURE REPORT  PATIENT: [name], [name]  MR#: [id number] BIRTHDATE: [birth year] , 64  yrs. old GENDER: female ENDOSCOPIST: Ladene Artist, MD, [facility] REFERRED BY:  [facility] PROCEDURE DATE:  02/21/2015 PROCEDURE:  EGD, diagnostic ASA CLASS:     Class III INDICATIONS:  hematochezia, acute post hemorrhagic anemia, and anemia secondary to chronic blood loss. MEDICATIONS: Benadryl 25 mg IV and Versed 2 mg IV TOPICAL ANESTHETIC: Cetacaine Spray DESCRIPTION OF PROCEDURE: After the risks benefits and alternatives of the procedure were thoroughly explained, informed consent was obtained.  The Pentax Gastroscope [id number] endoscope was introduced through the mouth and advanced to the 4th portion of the duodenum , Without limitations.  The instrument was slowly withdrawn as the mucosa was fully examined.    EXAM: The esophagus and gastroesophageal junction were completely normal in appearance.  The stomach was entered and closely examined.The pylorus, antrum, body, angularis, and lesser curvature were well visualized, including a retroflexed view of the cardia and fundus.  The stomach wall was normally distensable.  The scope passed easily through the pylorus into the 4th duodenum. The duodenum appeared normal.  Retroflexed views revealed no abnormalities.  The scope was then withdrawn from the patient and the procedure completed.  COMPLICATIONS: There were no immediate complications.  ENDOSCOPIC IMPRESSION: 1.  Normal appearing EGD  RECOMMENDATIONS: 1.  Consider colonoscopy for further evaluation 2.  Former Dr. Deatra Ina patient will establish GI care with Drs. Nandigam, Armbruster or Danis   eSigned:  Ladene Artist, MD, [facility] 02/21/2015 4:57 PM Revised: 02/21/2015 4:57 PM

## 2015-02-21 NOTE — Consult Note (Signed)
[facility] Consult: 8:23 AM 02/21/2015  LOS: 1 day    Referring Provider: Dr Lynnae January.  Primary Care Physician:  Gilles Chiquito, MD Primary Gastroenterologist:  Dr. Deatra Ina     Reason for Consultation:  Anemia, GI bleed   HPI: [name] is a 64 y.o. female.  Hx scleroderma/systemic sclerosis, severe pulmonary htn/cor pulmonale.  Diastolic heart failure.  Pericardial effusion with tamponase and pericardiocentesis 04/2014. Type 2 DM.  PAF with RVR but not an AC candidate. SS trait.  Anemia.  Diabetic neuropathy and nephropathy. CKD.Hypothyroidism. Anemia requiring transfusions as recently as 2 PRBC 12/23/14 when Hgb 5.8 and iron and iron sat low, transfused 4 PRBCs 03/2014.  Received Feraheme in 12/2014, discharged on po iron which was subsequently discontinued by hematologist Dr Lindi Adie who ordered iron infusion #2 on 01/29/2015.  Repeat iron studies/CBC set for 04/25/2015.  Dr Lindi Adie  suspects iron malabsorption.    Had not been compliant with PO iron and intermittently with her meds (2 weeks of no meds led to admission in 12/2014) 03/2014 EGD for melena.  Dr Fuller Plan.  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  09/2013 Colonoscopy for fecal incontinence Faint erythema throughout the colon. AVMs seen throughout the colon but more prominent on the left.  Internal hemorrhoids. Pathology: microscopic colitis 11/2011 colonoscopy ??, unable to locate reports.  02/2010 EGD to third portion of duodenum  For dysphagia and history scleroderma. Megaesophagus throughout entire esophagus. No stricture noted but Maloney dilatation performed. 08/2008 EGD by Dr. Jim Desanctis.  For complaint of abdominal pain. Decreased esophageal peristalsis noted but grossly normal mucosa of the esophagus, stomach and small bowel. 08/2008 colonoscopy  For  abdominal pain. Internal hemorrhoids noted. Otherwise normal study. 1994 EGD. By Dr. Deatra Ina Noted gastritis/esophagitis.  [name] baseline is inactive. She mostly sleeps at night and sits in a chair during the day. With any moderate activity she becomes short of breath but this is her normal baseline. Her appetite is diminished. No nausea or vomiting. Developed bloody BMs in AM 12/7, similar to 03/2014.  Initially there was some cramping abdominal discomfort which quickly resolved with the bowel movements. She had bloody BMs throughout the day. The last episode was yesterday at about 6 PM. Hgb 5.5, baseline:7 to 8   PT/INR normal. Has AKI (115/2.21 c/w 64/1.9 on 01/30/15) CT scan with large volume stool in colon.   No ETOH. No NSAIDs/ASA.  Appears to be compliant with her daily Nexium and other multiple meds.     Past Medical History  Diagnosis Date  . Secondary pulmonary hypertension ([place])     a. 2/2 scleroderma  . GERD (gastroesophageal reflux disease)   . Systemic sclerosis ([place])   . Unspecified essential hypertension   . Gastroparesis   . Sickle cell trait ([place])   . Obesity   . Scleroderma ([place])   . Trichomonas   . Peripheral neuropathy ([place])   . Type II diabetes mellitus ([facility])     a. diet control   . Arthritis   . Chronic kidney disease   . Gout   .  Hypothyroidism     a. may be due to amiodarone use. started on synthroid on 12/2014 admission   . Anemia     a.  hx of GI bleed and duodenal AVMs  . Chronic diastolic CHF (congestive heart failure) ([facility])     a. Echo 12/23/14 withEF 60-65%, moderately dilated RV, PASP 72, no pericardial effusion.  . PAF (paroxysmal atrial fibrillation) ([facility])     a. not a long term AC candidate due to GI bleeds    Past Surgical History  Procedure Laterality Date  . Tubal ligation    . Esophagogastroduodenoscopy  02/23/2010    D 2 AVM, ablated with APC.   . Shoulder arthroscopy Right 12/2009    subacromial decompression  .  Replacement total knee Left 11/2000  . Cardiac catheterization Right 04/24/2004  . Tonsillectomy  1960's  . Abdominal hysterectomy  1983    "partial"  . Shoulder arthroscopy w/ rotator cuff repair Right 01/2010  . Excisional total knee arthroplasty with antibiotic spacers Left 08/2010    "got infected & had to take 1st replacement out"  . Revision total knee arthroplasty Left 11/2010    "removed spacers; replaced knee"  . Total knee revision with scar debridement/patella revision with poly exchange Left 12/2010    fell; knee split opened; had to redo revision"  . Cardiac catheterization Left 07/2010  . Peripherally inserted central catheter insertion  09/2010  . Colonoscopy  09/06/2008, 09/2013    int rrhoids:Dr. Orr 2010. Pan-colonic AVMs, microscopic colitis per Dr Kaplan 2015  . Right heart catheterization N/A 03/18/2014    Procedure: RIGHT HEART CATH;  Surgeon: Dalton S McLean, MD;  Location: [facility] CATH LAB;  Service: Cardiovascular;  Laterality: N/A;  . Esophagogastroduodenoscopy N/A 03/23/2014    Procedure: ESOPHAGOGASTRODUODENOSCOPY (EGD);  Surgeon: Malaika Arnall T Darrio Bade, MD;  Location: [facility] ENDOSCOPY;  Service: Endoscopy;  Laterality: N/A;  . Pericardial tap N/A 05/03/2014    Procedure: PERICARDIAL TAP;  Surgeon: Henry W Smith III, MD;  Location: [facility] CATH LAB;  Service: Cardiovascular;  Laterality: N/A;    Prior to Admission medications   Medication Sig Start Date End Date Taking? Authorizing Provider  ADCIRCA 20 MG TABS TAKE 2 TABLETS (40MG) BY MOUTH ONCE DAILY WITH OR WITHOUT FOOD. CALL [phone] FOR REFILLS. 06/13/14  Yes Robert S Byrum, MD  ambrisentan (LETAIRIS) 10 MG tablet Take 1 tablet (10 mg total) by mouth daily. 11/25/14  Yes Robert S Byrum, MD  amiodarone (PACERONE) 200 MG tablet Take 1 tablet (200 mg total) by mouth daily. 12/17/14  Yes Dalton S McLean, MD  esomeprazole (NEXIUM) 40 MG capsule Take 40 mg by mouth 2 (two) times daily.    Yes Historical Provider, MD  fenofibrate 54 MG tablet  Take 1 tablet (54 mg total) by mouth daily. 03/25/14  Yes Peter E Babcock, NP  FLUoxetine (PROZAC) 20 MG capsule take 1 capsule by mouth every morning 02/14/15  Yes Emily B Mullen, MD  HYDROcodone-acetaminophen (NORCO) 10-325 MG per tablet Take 1 tablet by mouth every 6 (six) hours as needed for moderate pain.  07/17/12  Yes Historical Provider, MD  levothyroxine (SYNTHROID, LEVOTHROID) 25 MCG tablet Take 1 tablet (25 mcg total) by mouth daily before breakfast. 12/28/14  Yes Kathryn R Thompson, PA-C  magnesium oxide (MAG-OX) 400 MG tablet Take 400 mg by mouth 2 (two) times daily.   Yes Historical Provider, MD  metolazone (ZAROXOLYN) 2.5 MG tablet Take 1 tablet (2.5 mg total) by mouth once a week. On Fridays 01/30/15    Yes Amy D Clegg, NP  nortriptyline (PAMELOR) 10 MG capsule Take 20 mg by mouth at bedtime.   Yes Historical Provider, MD  potassium chloride SA (K-DUR,KLOR-CON) 20 MEQ tablet Take 20 mEq by mouth daily.   Yes Historical Provider, MD  pregabalin (LYRICA) 150 MG capsule Take 1 capsule (150 mg total) by mouth 3 (three) times daily. 08/19/14  Yes Marcial Pacas, MD  Selexipag (UPTRAVI) 1600 MCG TABS Take 1,600 mcg by mouth 2 (two) times daily. 10/22/14  Yes Jolaine Artist, MD  torsemide (DEMADEX) 20 MG tablet Take 3 tablets (60 mg total) by mouth 2 (two) times daily. 01/21/15  Yes Amy D Ninfa Meeker, NP    Scheduled Meds:   Infusions: . lactated ringers 125 mL/hr at 02/21/15 0439   PRN Meds: albuterol   Allergies as of 02/20/2015 - Review Complete 02/20/2015  Allergen Reaction Noted  . Cephalexin Rash 05/01/2010  . Ciprofloxacin Rash 07/23/2010  . Codeine Other (See Comments)   . Contrast media [iodinated diagnostic agents] Hives 08/03/2011  . Iohexol Hives 03/04/2008    Family History  Problem Relation Age of Onset  . Heart disease Mother   . Diabetes Mother   . Diabetes Sister     Social History   Social History  . Marital Status: Legally Separated    Spouse Name: N/A  . Number  of Children: 2  . Years of Education: 11   Occupational History  . Umemployed   . Disability     Social History Main Topics  . Smoking status: Never Smoker   . Smokeless tobacco: Never Used  . Alcohol Use: No  . Drug Use: No  . Sexual Activity: Not on file   Other Topics Concern  . Not on file   Social History Narrative    FAMILY HISTORY:  Significant for coronary artery disease and diabetes   Patient lives at home with granddaughter.    Patient has 2 children.    Patient has 11 years of education.    Patient is on disability.    Patient is right handed.    Patient is separated.      REVIEW OF SYSTEMS: Constitutional:   weak, inactive. No recent falls.  ENT:  No nose bleeds Pulm:   Chronic DOE, stable CV:  No palpitations, no LE edema.  no chest pain GU:  No hematuria, no frequency GI:  No dysphagia or heartburn.per HPI Heme:  Per HPI   Transfusions:  Per HPI Neuro:  No headaches, no peripheral tingling or numbness Derm:  No itching, no rash or sores.  Endocrine:  No sweats or chills.  No polyuria or dysuria Immunization:  Reviewed.  Flu shot current Travel:  None beyond local counties in last few months.    PHYSICAL EXAM: Vital signs in last 24 hours: Filed Vitals:   02/21/15 0712 02/21/15 0745  BP: 97/43 88/48  Pulse: 67 70  Temp:    Resp: 17 20   Wt Readings from Last 3 Encounters:  02/20/15 66.679 kg (147 lb)  01/30/15 66.951 kg (147 lb 9.6 oz)  01/24/15 67.042 kg (147 lb 12.8 oz)    General: Pleasant, somnolent, chronically ill and frail appearing. Comfortable Head:   No facial asymmetry or swelling. No signs of head trauma.  Eyes:   No scleral icterus. Conjunctiva is pale. ENT:  Hearing intact, question somewhat diminished.  Nose:   no congestion.  No discharge Mouth:   moist, clear oral mucosa.  Edentulous. Dentures not in place.  Neck:  No mass, no JVD. Chest:  Clear but greatly diminished bilaterally. No cough or labored breathing.  Heart:  RRR. No MRG. S1/S2 audible. Abdomen:  soft. Not distended or protuberant. No HSM, no masses. Not tender. Bowel sounds hypoactive but no high-pitched or tinkling sounds..   Rectal: [place], pasty stool. No masses.   Musc/Skeltl: No joint contractures deformities, swelling or erythema Extremities: CCE.  Neurologic:  Somewhat difficult to arouse but oriented 3. Somnolent. Moves all 4 limbs, limb strength not tested. No tremor. No asterixis.  Skin:  No open sores or rashes.  Tattoos: none observed Nodes:  No cervical or inguinal adenopathy    Psych:  Cooperative, calm, somnolent  Intake/Output from previous day: 12/08 0701 - 12/09 0700 In: [place] Out: -  Intake/Output this shift:    LAB RESULTS:  Recent Labs  02/20/15 2111  WBC 9.7  HGB 5.5*  HCT 17.4*  PLT 178   BMET Lab Results  Component Value Date   NA 144 02/20/2015   NA 144 01/30/2015   NA 142 01/21/2015   K 4.5 02/20/2015   K 4.4 01/30/2015   K 3.9 01/21/2015   CL 107 02/20/2015   CL 107 01/30/2015   CL 108 01/21/2015   CO2 24 02/20/2015   CO2 24 01/30/2015   CO2 21* 01/21/2015   GLUCOSE 118* 02/20/2015   GLUCOSE 70 01/30/2015   GLUCOSE 69 01/21/2015   BUN 116* 02/20/2015   BUN 64* 01/30/2015   BUN 49* 01/21/2015   CREATININE 2.21* 02/20/2015   CREATININE 1.99* 01/30/2015   CREATININE 1.67* 01/21/2015   CALCIUM 9.0 02/20/2015   CALCIUM 9.5 01/30/2015   CALCIUM 9.3 01/21/2015   LFT  Recent Labs  02/20/15 2111  PROT 6.5  ALBUMIN 3.1*  AST 22  ALT 11*  ALKPHOS 78  BILITOT 0.3   PT/INR Lab Results  Component Value Date   INR 1.19 03/17/2014   INR 1.46 12/26/2013   INR 1.68* 06/08/2013   PROTIME 18.7 08/20/2008   Hepatitis Panel No results for input(s): HEPBSAG, HCVAB, HEPAIGM, HEPBIGM in the last 72 hours. C-Diff No components found for: CDIFF Lipase     Component Value Date/Time   LIPASE 29 02/20/2015 2111    Drugs of Abuse     Component Value Date/Time   LABOPIA  POSITIVE* 10/02/2010 1128   COCAINSCRNUR NONE DETECTED 10/02/2010 1128   LABBENZ NONE DETECTED 10/02/2010 1128   AMPHETMU NONE DETECTED 10/02/2010 1128   THCU NONE DETECTED 10/02/2010 1128   LABBARB NONE DETECTED 10/02/2010 1128     RADIOLOGY STUDIES: Dg Chest Port 1 View  02/20/2015  CLINICAL DATA:  Acute onset of generalized chest pain and anemia. Generalized weakness. Initial encounter. EXAM: PORTABLE CHEST 1 VIEW COMPARISON:  Chest radiograph performed 12/24/2014 FINDINGS: The lungs are well-aerated. Mild right basilar and left midlung airspace opacities raise concern for mild pneumonia. There is no evidence of pleural effusion or pneumothorax. The cardiomediastinal silhouette is borderline enlarged. No acute osseous abnormalities are seen. IMPRESSION: Mild right basilar and left midlung airspace opacities raise concern for mild pneumonia. Borderline cardiomegaly. Electronically Signed   By: Garald Balding M.D.   On: 02/20/2015 22:25   Ct Renal Stone Study  02/20/2015  CLINICAL DATA:  Generalized abdominal pain, swelling, diarrhea. Bright red blood per rectum beginning yesterday. Severe anemia. EXAM: CT ABDOMEN AND PELVIS WITHOUT CONTRAST TECHNIQUE: Multidetector CT imaging of the abdomen and pelvis was performed following the standard protocol without IV contrast. COMPARISON:  03/11/2014  FINDINGS: Lower chest: No acute findings. Previously seen pericardial effusion is resolved. Hepatobiliary: Probable small hepatic cysts appears stable. No definite liver masses visualized on this unenhanced exam. Gallbladder is unremarkable. Pancreas: No mass or inflammatory process identified on this un-enhanced exam. Spleen: Within normal limits in size. Adrenals/Urinary Tract: No evidence of urolithiasis or hydronephrosis. No definite mass visualized on this un-enhanced exam. Stomach/Bowel: No evidence of obstruction, inflammatory process, or abnormal fluid collections. Large colonic stool burden noted.  Vascular/Lymphatic: No pathologically enlarged lymph nodes. 9 mm left common iliac lymph node is decreased in size from 15 mm on previous study. No evidence of abdominal aortic aneurysm. Reproductive: Prior hysterectomy noted. Adnexal regions are unremarkable in appearance. Other: None. Musculoskeletal:  No suspicious bone lesions identified. IMPRESSION: No acute findings identified within the abdomen or pelvis. Large stool burden noted; suggest clinical correlation for possible constipation. Electronically Signed   By: Earle Gell M.D.   On: 02/20/2015 23:44    ENDOSCOPIC STUDIES: See HPI  IMPRESSION:   *  Recurrent GI bleed.  03/2014 GI bleed, melena due to D2 AVM, ablated at EGD.  Non-bleeding AVMs revealed on previous colonoscopy.   *  ABL and chronic anemia.  Patient just completed third of 3 ordered PRBCs.  Dr Lindi Adie has been overseeing parenteral iron infusions, s/p 2 infusions in 12/2014 and 01/2015.   *  AKI, CKD.    *  Cor pulmonale and right heart failure.       PLAN:     *   ? Repeat EGD, colonoscopy?    *    Allow clears if no plans to scope pt today.   *    Po Protonix.  Does not need ppi drip.    Azucena Freed  02/21/2015, 8:23 AM Pager: [phone]      Attending physician's note   I have taken a history, examined the patient and reviewed the chart. I agree with the Advanced Practitioner's note, impression and recommendations. Recurrent GI bleed very likely due to known gastrointestinal AVMs. Transfuse to keep Hb >8. Continue Fe replacement long term as we should anticipate ongoing chronic GI blood loss. EGD today for further evaluation.  Lucio Edward, MD [facility] [phone] Mon-Fri 8a-5p [phone] after 5p, weekends, holidays

## 2015-02-21 NOTE — H&P (Signed)
[facility] History and Physical  AKITA MAXIM QQV:[id number] DOB: 1951-03-09 DOA: 02/20/2015  Referring physician:ED PCP: Gilles Chiquito, MD   Chief Complaint: Bloody bowel movements  HPI:  Patient is a 64 year old female with an extensive past medical history significant for GI bleed 2/2 AVM/hemorrhoids , scleroderma, DM type 2, chronic dCHF last echo in 12/2014 EF 60- 65%, PAH, GERD, hypothyroidism, CKD, and PAF; who presents with a recurrent GI bleed. Symptoms started this morning while patient reports having just grossly bloody bowel movements. Patient denies any NSAID use, hematemesis, fever. She reports associated symptoms of shortness of breath with exertion, chills, and some chest pain initially.   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   Upon arrival to the emergency department patient was noted to have a hemoglobin of 5.5  and gross blood per rectum. Gastroenterology was  immediately consulted. ED physician talked with Dr. Jeralyn Bennett of  gastroenterology who recommended general hydration while awaiting blood products. Patient was typed and screened  for blood, but blood products were delayed secondary to patient      Review of Systems  Constitutional: Positive for chills and malaise/fatigue.  HENT: Negative for ear discharge and ear pain.   Eyes: Negative for double vision and photophobia.  Respiratory: Positive for shortness of breath. Negative for hemoptysis and wheezing.   Cardiovascular: Positive for chest pain and palpitations. Negative for leg swelling.  Gastrointestinal: Positive for abdominal pain and blood in stool. Negative for nausea and vomiting.  Genitourinary: Negative for frequency and hematuria.  Musculoskeletal: Positive for myalgias and joint pain.  Skin: Negative for itching and rash.  Neurological: Positive for weakness. Negative for focal weakness and  seizures.  Endo/Heme/Allergies: Positive for polydipsia. Bruises/bleeds easily.  Psychiatric/Behavioral: Negative for substance abuse.       Past Medical History  Diagnosis Date  . Secondary pulmonary hypertension ([place])     a. 2/2 scleroderma  . GERD (gastroesophageal reflux disease)   . Systemic sclerosis ([place])   . Unspecified essential hypertension   . Gastroparesis   . Sickle cell trait ([place])   . Obesity   . Scleroderma ([place])   . Trichomonas   . Peripheral neuropathy ([place])   . Type II diabetes mellitus ([facility])     a. diet control   . Arthritis   . Chronic kidney disease   . Gout   . Hypothyroidism     a. may be due to amiodarone use. started on synthroid on 12/2014 admission   . Anemia     a.  hx of GI bleed and duodenal AVMs  . Chronic diastolic CHF (congestive heart failure) ([place])     a. Echo 12/23/14 withEF 60-65%, moderately dilated RV, PASP 72, no pericardial effusion.  Marland Kitchen PAF (paroxysmal atrial fibrillation) (H[facility])     a. not a long term AC candidate due to GI bleeds     Past Surgical History  Procedure Laterality Date  . Tubal ligation    . Esophagogastroduodenoscopy  02/23/2010    D 2 AVM, ablated with APC.   Marland Kitchen Shoulder arthroscopy Right 12/2009    subacromial decompression  . Replacement total knee Left 11/2000  . Cardiac catheterization Right 04/24/2004  . Tonsillectomy  1960's  . Abdominal hysterectomy  1983    "partial"  . Shoulder arthroscopy w/ rotator cuff repair Right 01/2010  . Excisional total knee arthroplasty with antibiotic spacers  Left 08/2010    "got infected & had to take 1st replacement out"  . Revision total knee arthroplasty Left 11/2010    "removed spacers; replaced knee"  . Total knee revision with scar debridement/patella revision with poly exchange Left 12/2010    fell; knee split opened; had to redo revision"  . Cardiac catheterization Left 07/2010  . Peripherally inserted central catheter insertion  09/2010  . Colonoscopy   09/06/2008, 09/2013    int rrhoids:Dr. Lajoyce Corners 2010. Pan-colonic AVMs, microscopic colitis per Dr Deatra Ina 2015  . Right heart catheterization N/A 03/18/2014    Procedure: RIGHT HEART CATH;  Surgeon: Larey Dresser, MD;  Location: [facility] CATH LAB;  Service: Cardiovascular;  Laterality: N/A;  . Esophagogastroduodenoscopy N/A 03/23/2014    Procedure: ESOPHAGOGASTRODUODENOSCOPY (EGD);  Surgeon: Ladene Artist, MD;  Location: [facility] ENDOSCOPY;  Service: Endoscopy;  Laterality: N/A;  . Pericardial tap N/A 05/03/2014    Procedure: PERICARDIAL TAP;  Surgeon: Sinclair Grooms, MD;  Location: [facility] CATH LAB;  Service: Cardiovascular;  Laterality: N/A;      Social History:  reports that she has never smoked. She has never used smokeless tobacco. She reports that she does not drink alcohol or use illicit drugs.   Allergies  Allergen Reactions  . Cephalexin Rash  . Ciprofloxacin Rash  . Codeine Other (See Comments)     GI upset  . Contrast Media [Iodinated Diagnostic Agents] Hives  . Iohexol Hives     Code: HIVES, Desc: pt breaks out in large hives. needs full premeds, Onset Date: [id number]     Family History  Problem Relation Age of Onset  . Heart disease Mother   . Diabetes Mother   . Diabetes Sister         Prior to Admission medications   Medication Sig Start Date End Date Taking? Authorizing Provider  ADCIRCA 20 MG TABS TAKE 2 TABLETS (40MG) BY MOUTH ONCE DAILY WITH OR WITHOUT FOOD. CALL [phone] FOR REFILLS. 06/13/14  Yes Collene Gobble, MD  ambrisentan (LETAIRIS) 10 MG tablet Take 1 tablet (10 mg total) by mouth daily. 11/25/14  Yes Collene Gobble, MD  amiodarone (PACERONE) 200 MG tablet Take 1 tablet (200 mg total) by mouth daily. 12/17/14  Yes Larey Dresser, MD  esomeprazole (NEXIUM) 40 MG capsule Take 40 mg by mouth 2 (two) times daily.    Yes Historical Provider, MD  fenofibrate 54 MG tablet Take 1 tablet (54 mg total) by mouth daily. 03/25/14  Yes Erick Colace, NP  FLUoxetine (PROZAC) 20  MG capsule take 1 capsule by mouth every morning 02/14/15  Yes Sid Falcon, MD  HYDROcodone-acetaminophen [facility]) 10-325 MG per tablet Take 1 tablet by mouth every 6 (six) hours as needed for moderate pain.  07/17/12  Yes Historical Provider, MD  levothyroxine (SYNTHROID, LEVOTHROID) 25 MCG tablet Take 1 tablet (25 mcg total) by mouth daily before breakfast. 12/28/14  Yes Eileen Stanford, PA-C  magnesium oxide (MAG-OX) 400 MG tablet Take 400 mg by mouth 2 (two) times daily.   Yes Historical Provider, MD  metolazone (ZAROXOLYN) 2.5 MG tablet Take 1 tablet (2.5 mg total) by mouth once a week. On Fridays 01/30/15  Yes Amy D Clegg, NP  nortriptyline (PAMELOR) 10 MG capsule Take 20 mg by mouth at bedtime.   Yes Historical Provider, MD  potassium chloride SA (K-DUR,KLOR-CON) 20 MEQ tablet Take 20 mEq by mouth daily.   Yes Historical Provider, MD  pregabalin (LYRICA) 150 MG capsule  Take 1 capsule (150 mg total) by mouth 3 (three) times daily. 08/19/14  Yes Marcial Pacas, MD  Selexipag (UPTRAVI) 1600 MCG TABS Take 1,600 mcg by mouth 2 (two) times daily. 10/22/14  Yes Jolaine Artist, MD  torsemide (DEMADEX) 20 MG tablet Take 3 tablets (60 mg total) by mouth 2 (two) times daily. 01/21/15  Yes Amy Estrella Deeds, NP     Physical Exam: Filed Vitals:   02/20/15 2231 02/20/15 2309 02/20/15 2315 02/20/15 2357  BP: 94/49 91/44 90/42 81/42  Pulse: 78 76 76 76  Temp:      TempSrc:      Resp: _0 Height:      Weight:      SpO2: 99% 100% 100% 100%     Constitutional: Vital signs reviewed.  Patient is chronically ill appearing in acute distress intermittently shivering.  Head: Normocephalic and atraumatic  Ear: TM normal bilaterally  Mouth: no erythema or exudates, dry mucous membranes Eyes: PERRL, EOMI, conjunctivae normal, No scleral icterus.  Neck: Supple, Trachea midline normal ROM, No JVD, mass, thyromegaly, or carotid bruit present.  Cardiovascular: RRR, S1 normal, S2 normal, no MRG, pulses  symmetric and intact bilaterally  Pulmonary/Chest: CTAB, no wheezes, rales, or rhonchi  Abdominal: Diffuse abdominal tenderness, with positive bowel sounds, no masses, organomegaly, or guarding present.  GU: no CVA tenderness Musculoskeletal: No joint deformities, erythema, or stiffness, ROM full and no nontender Ext: no edema and no cyanosis, pulses palpable bilaterally (DP and PT)  Hematology: no cervical, inginal, or axillary adenopathy.  Neurological: A&O x3, Strenght is normal and symmetric bilaterally, cranial nerve II-XII are grossly intact, no focal motor deficit, sensory intact to light touch bilaterally.  Skin: dry and intact. No rash, cyanosis, or clubbing.  Psychiatric: Normal mood and affect. speech and behavior is normal. Judgment and thought content normal. Cognition and memory are normal.      Data Review   Micro Results No results found for this or any previous visit (from the past 240 hour(s)).  Radiology Reports Dg Chest Port 1 View  02/20/2015  CLINICAL DATA:  Acute onset of generalized chest pain and anemia. Generalized weakness. Initial encounter. EXAM: PORTABLE CHEST 1 VIEW COMPARISON:  Chest radiograph performed 12/24/2014 FINDINGS: The lungs are well-aerated. Mild right basilar and left midlung airspace opacities raise concern for mild pneumonia. There is no evidence of pleural effusion or pneumothorax. The cardiomediastinal silhouette is borderline enlarged. No acute osseous abnormalities are seen. IMPRESSION: Mild right basilar and left midlung airspace opacities raise concern for mild pneumonia. Borderline cardiomegaly. Electronically Signed   By: Garald Balding M.D.   On: 02/20/2015 22:25   Ct Renal Stone Study  02/20/2015  CLINICAL DATA:  Generalized abdominal pain, swelling, diarrhea. Bright red blood per rectum beginning yesterday. Severe anemia. EXAM: CT ABDOMEN AND PELVIS WITHOUT CONTRAST TECHNIQUE: Multidetector CT imaging of the abdomen and pelvis was  performed following the standard protocol without IV contrast. COMPARISON:  03/11/2014 FINDINGS: Lower chest: No acute findings. Previously seen pericardial effusion is resolved. Hepatobiliary: Probable small hepatic cysts appears stable. No definite liver masses visualized on this unenhanced exam. Gallbladder is unremarkable. Pancreas: No mass or inflammatory process identified on this un-enhanced exam. Spleen: Within normal limits in size. Adrenals/Urinary Tract: No evidence of urolithiasis or hydronephrosis. No definite mass visualized on this un-enhanced exam. Stomach/Bowel: No evidence of obstruction, inflammatory process, or abnormal fluid collections. Large colonic stool burden noted. Vascular/Lymphatic: No pathologically enlarged lymph nodes. 9 mm left common  iliac lymph node is decreased in size from 15 mm on previous study. No evidence of abdominal aortic aneurysm. Reproductive: Prior hysterectomy noted. Adnexal regions are unremarkable in appearance. Other: None. Musculoskeletal:  No suspicious bone lesions identified. IMPRESSION: No acute findings identified within the abdomen or pelvis. Large stool burden noted; suggest clinical correlation for possible constipation. Electronically Signed   By: Earle Gell M.D.   On: 02/20/2015 23:44     CBC  Recent Labs Lab 02/20/15 2111  WBC 9.7  HGB 5.5*  HCT 17.4*  PLT 178  MCV 78.0  MCH 24.7*  MCHC 31.6  RDW 25.5*    Chemistries   Recent Labs Lab 02/20/15 2111  NA 144  K 4.5  CL 107  CO2 24  GLUCOSE 118*  BUN 116*  CREATININE 2.21*  CALCIUM 9.0  AST 22  ALT 11*  ALKPHOS 78  BILITOT 0.3   ------------------------------------------------------------------------------------------------------------------ estimated creatinine clearance is 23.9 mL/min (by C-G formula based on Cr of 2.21). ------------------------------------------------------------------------------------------------------------------ No results for input(s): HGBA1C  in the last 72 hours. ------------------------------------------------------------------------------------------------------------------ No results for input(s): CHOL, HDL, LDLCALC, TRIG, CHOLHDL, LDLDIRECT in the last 72 hours. ------------------------------------------------------------------------------------------------------------------ No results for input(s): TSH, T4TOTAL, T3FREE, THYROIDAB in the last 72 hours.  Invalid input(s): FREET3 ------------------------------------------------------------------------------------------------------------------ No results for input(s): VITAMINB12, FOLATE, FERRITIN, TIBC, IRON, RETICCTPCT in the last 72 hours.  Coagulation profile No results for input(s): INR, PROTIME in the last 168 hours.  No results for input(s): DDIMER in the last 72 hours.  Cardiac Enzymes No results for input(s): CKMB, TROPONINI, MYOGLOBIN in the last 168 hours.  Invalid input(s): CK ------------------------------------------------------------------------------------------------------------------ Invalid input(s): POCBNP   CBG: No results for input(s): GLUCAP in the last 168 hours.     EKG: Pending   Assessment/Plan Active Problems:   Acute lower GI bleeding: Patient with history of bloody bowel movements 1 day. Hemoglobin initially 5.5 on admission. Patient typed and screened for blood products. Blood transfusion initially delayed secondary to patient's blood being found to have antibiotics. Currently transfusing 3 units 2 units should be on hold. Gastroenterology notified and will see in a.m. -Admit to stepdown -Protonix drip -Patient to be NPO -Check H&H 1 hour after last blood transfusion -Nursing to notify physician if less than 7  -Strict I's and O's  - Will monitor calcium levels and replace as needed - nasal cannula oxygen and continuous pulse oximeter   Acute blood loss anemia: Hemoglobin noted to be 5.5 admission. Secondary to above -H&H every  4 hours  -Goal hemoglobin greater than 8 due to patient's cardiac history  Hypotension-patient with blood pressures of 81/41 while in the emergency department. Blood pressures improved with gentle IV fluid -Continue gentle IV fluids due to patient's congestive heart failure history -hold home blood pressure meds  Possible pneumonia: Opacities seen in the bilateral lungs on chest x-ray -Empiric antibiotics of doxycycline -Repeat follow-up chest x-ray when patient more stable  Chronic diastolic congestive heart failure: Last EF was thought to be 60-65% in 12/2014. -Patient required multiple units of blood will monitor closely  Acute on chronic kidney disease stage III : Baseline creatinine 1.8 approximately, however on creatinine acutely elevated at 2.2 suspect to patient's acute blood loss anemia -Check BMP in a.m.  Diabetes mellitus type 2: -CBGs every 4 hours -Sensitive sliding scale of insulin -Held or hypoglycemic agents  History of scleroderma  Code Status:   full Family Communication: bedside Disposition Plan: admit   Total time spent 55 minutes.Greater than 50% of this  time was spent in counseling, explanation of diagnosis, planning of further management, and coordination of care  [facility] Pager [phone]  If 7PM-7AM, please contact night-coverage [email or web address] Password [email or web address] 02/21/2015, 12:13 AM

## 2015-02-21 NOTE — ED Notes (Signed)
[place], [phone] - please call w/ updates

## 2015-02-21 NOTE — Consult Note (Signed)
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   She is followed closely in the HF clinic and was last seen November 22nd.  She was volume overloaded. She continued on torsemide 60 mg twice a day with metolazone added weekly. Pulmonary HTN regimen included: ambrisentan 10 mg, selexapag 1600 mcg bid, adcirca 40 mg daily. Weight at that visit was 147 pounds.  Yesterday she had melena and presented to [facility] ED.  Reports mild dizziness. Denies SOB at rest. . Hgb on admit was 5.5 with blood per rectum. Received 3 UPRBs. NPO. She has not received selexapag. Marland KitchenHas been receiving NS 100 cc per hours. GI consulted  Review of Systems: [y] = yes, _0  = no   General: Weight gain _1 ; Weight loss _2 ; Anorexia _3 ; Fatigue [Y ]; Fever _4 ; Chills _5 ; Weakness _6   Cardiac: Chest pain/pressure _7 ; Resting SOB _8 ; Exertional SOB [Y ]; Orthopnea _9 ; Pedal Edema _10 ; Palpitations _11 ; Syncope _12 ; Presyncope _13 ; Paroxysmal nocturnal dyspnea_14   Pulmonary: Cough _15 ; Wheezing_16 ; Hemoptysis_17 ; Sputum _18 ; Snoring _19   GI: Vomiting_20 ; Dysphagia_21 ; Melena[Y ]; Hematochezia _22 ; Heartburn_23 ; Abdominal pain _24 ; Constipation _25 ; Diarrhea _26 ; BRBPR [Y ]  GU: Hematuria_27 ; Dysuria _28 ; Nocturia_29   Vascular: Pain in legs with walking _30 ; Pain in feet with lying flat _31 ; Non-healing sores _32 ; Stroke _33 ; TIA _34 ; Slurred speech _35 ;  Neuro: Headaches_36 ; Vertigo_37 ; Seizures_38 ; Paresthesias_39 ;Blurred vision _40 ; Diplopia _41 ; Vision changes _42   Ortho/Skin: Arthritis _43 ; Joint pain [Y ]; Muscle pain _44 ; Joint swelling _45 ; Back Pain _46 ; Rash  _47   Psych: Depression_48 ; Anxiety_49   Heme: Bleeding problems _50 ; Clotting disorders _51 ; Anemia _52   Endocrine: Diabetes [Y ]; Thyroid dysfunction_53   Home Medications Prior to Admission medications   Medication Sig Start Date End Date Taking? Authorizing Provider  ADCIRCA 20 MG TABS TAKE 2 TABLETS (40MG) BY MOUTH ONCE DAILY WITH OR WITHOUT FOOD. CALL [phone] FOR REFILLS. 06/13/14  Yes Collene Gobble, MD  ambrisentan (LETAIRIS) 10 MG tablet Take 1 tablet (10 mg total) by mouth daily. 11/25/14  Yes Collene Gobble, MD  amiodarone (PACERONE) 200 MG tablet Take 1 tablet (200 mg total) by mouth daily. 12/17/14  Yes Larey Dresser, MD  esomeprazole (NEXIUM) 40 MG capsule Take 40 mg by mouth 2 (two) times daily.    Yes Historical Provider, MD  fenofibrate 54 MG tablet Take 1 tablet (54 mg total) by mouth daily. 03/25/14  Yes Erick Colace, NP  FLUoxetine (PROZAC) 20 MG capsule take 1 capsule by mouth every morning 02/14/15  Yes Sid Falcon, MD  HYDROcodone-acetaminophen [facility]) 10-325 MG per tablet Take 1 tablet by mouth every 6 (six) hours as needed for moderate pain.  07/17/12  Yes Historical Provider, MD  levothyroxine (SYNTHROID, LEVOTHROID) 25 MCG tablet Take 1 tablet (25 mcg total) by mouth daily before breakfast. 12/28/14  Yes Eileen Stanford, PA-C  magnesium oxide (MAG-OX) 400 MG tablet Take 400 mg by mouth 2 (two) times daily.   Yes Historical Provider, MD  metolazone (ZAROXOLYN) 2.5 MG tablet Take 1 tablet (2.5 mg total) by mouth once a week. On Fridays 01/30/15  Yes Amy D Clegg, NP  nortriptyline (PAMELOR) 10 MG capsule Take 20 mg by mouth at bedtime.   Yes Historical Provider, MD  potassium chloride SA (K-DUR,KLOR-CON) 20 MEQ tablet Take 20 mEq by mouth daily.   Yes Historical Provider, MD  pregabalin (LYRICA) 150 MG capsule Take 1 capsule (150 mg total) by mouth 3 (three) times daily. 08/19/14  Yes Marcial Pacas, MD  Selexipag (UPTRAVI) 1600 MCG TABS Take 1,600 mcg by mouth 2 (two)  times daily. 10/22/14  Yes Jolaine Artist, MD  torsemide (DEMADEX) 20 MG tablet Take 3 tablets (60 mg total) by mouth 2 (two) times daily. 01/21/15  Yes Amy Estrella Deeds, NP    Past Medical History: Past Medical History  Diagnosis Date  . Secondary pulmonary hypertension ([place])     a. 2/2 scleroderma  . GERD (gastroesophageal reflux disease)   . Systemic sclerosis ([place])   . Unspecified essential hypertension   . Gastroparesis   . Sickle cell trait ([place])   . Obesity   . Scleroderma ([place])   . Trichomonas   . Peripheral neuropathy ([place])   . Type II diabetes mellitus ([facility])     a. diet control   . Arthritis   . Chronic kidney disease   . Gout   . Hypothyroidism     a. may be due to amiodarone use. started on synthroid on 12/2014 admission   . Anemia     a.  hx of GI bleed and duodenal AVMs  . Chronic diastolic CHF (congestive heart failure) ([place])     a. Echo 12/23/14 withEF 60-65%, moderately dilated RV, PASP 72, no pericardial effusion.  Marland Kitchen PAF (paroxysmal atrial fibrillation) ([facility])     a. not a long term AC candidate due to GI bleeds    Past Surgical History: Past Surgical History  Procedure Laterality Date  . Tubal ligation    . Esophagogastroduodenoscopy  02/23/2010    D 2 AVM, ablated with APC.   Marland Kitchen Shoulder arthroscopy Right 12/2009    subacromial decompression  . Replacement total knee Left 11/2000  . Cardiac catheterization Right 04/24/2004  . Tonsillectomy  1960's  . Abdominal hysterectomy  1983    "partial"  . Shoulder arthroscopy w/ rotator cuff repair Right 01/2010  . Excisional total knee arthroplasty with antibiotic spacers Left 08/2010    "got infected & had to take 1st replacement out"  . Revision total knee arthroplasty Left 11/2010    "removed spacers; replaced knee"  . Total knee revision with scar debridement/patella revision with poly exchange Left 12/2010    fell; knee split opened; had to redo revision"  . Cardiac catheterization Left 07/2010  .  Peripherally inserted central catheter insertion  09/2010  . Colonoscopy  09/06/2008, 09/2013    int rrhoids:Dr. Lajoyce Corners 2010. Pan-colonic AVMs, microscopic colitis per Dr Deatra Ina 2015  . Right heart catheterization N/A 03/18/2014    Procedure: RIGHT HEART CATH;  Surgeon: Larey Dresser, MD;  Location: [facility] CATH LAB;  Service: Cardiovascular;  Laterality: N/A;  . Esophagogastroduodenoscopy N/A 03/23/2014    Procedure: ESOPHAGOGASTRODUODENOSCOPY (EGD);  Surgeon: Ladene Artist, MD;  Location: [facility] ENDOSCOPY;  Service: Endoscopy;  Laterality: N/A;  . Pericardial tap N/A 05/03/2014    Procedure: PERICARDIAL TAP;  Surgeon: Sinclair Grooms, MD;  Location: [facility] CATH LAB;  Service: Cardiovascular;  Laterality: N/A;    Family History: Family History  Problem Relation Age of Onset  . Heart disease Mother   . Diabetes Mother   . Diabetes Sister     Social History: Social History   Social History  . Marital Status: Legally Separated    Spouse Name: N/A  . Number of Children: 2  . Years of Education: 11   Occupational History  . Umemployed   . Disability     Social History Main Topics  . Smoking status: Never Smoker   . Smokeless tobacco: Never Used  . Alcohol Use: No  . Drug Use: No  . Sexual Activity: No   Other Topics Concern  . None   Social History Narrative    FAMILY HISTORY:  Significant for coronary artery disease and diabetes   Patient lives at home with granddaughter.    Patient has 2 children.    Patient has 11 years of education.    Patient is on disability.    Patient is right handed.    Patient is separated.      Allergies:  Allergies  Allergen Reactions  . Cephalexin Rash  . Ciprofloxacin Rash  . Codeine Other (See Comments)     GI upset  . Contrast Media [Iodinated Diagnostic Agents] Hives  . Iohexol Hives     Code: HIVES, Desc: pt breaks out in large hives. needs full premeds, Onset Date: [id number]     Objective:    Vital Signs:   Temp:  [98.2 F (36.8  C)-99.6 F (37.6 C)] 98.2 F (36.8 C) (12/09 1242) Pulse Rate:  [25-81] 64 (12/09 1443) Resp:  [5-23] 12 (12/09 1443) BP: (80-105)/(39-56) 80/44 mmHg (12/09 1443) SpO2:  [98 %-100 %] 100 % (12/09 1443) Weight:  [137 lb 2 oz (62.2 kg)-147 lb (66.679 kg)] 137 lb 2 oz (62.2 kg) (12/09 1220) Last BM Date: 02/20/15  Weight change: Filed Weights   02/20/15 2044 02/21/15 1220  Weight: 147 lb (66.679 kg) 137 lb 2 oz (62.2 kg)    Intake/Output:   Intake/Output Summary (Last 24 hours) at 02/21/15 1452 Last data filed at 02/21/15 1400  Gross per 24 hour  Intake 2573.09 ml  Output      0 ml  Net 2573.09 ml     Physical Exam: General:  Chronically ill appearing. No resp difficulty HEENT: normal Neck: supple. JVP 9-10 . Carotids 2+ bilat; no bruits. No lymphadenopathy or thryomegaly appreciated. Cor: PMI nondisplaced. Regular rate & rhythm. 2/6 TR + RV lift. Increased P2 Lungs: clear Abdomen: soft, nontender, minimally distended. No hepatosplenomegaly. No bruits or masses. Good bowel sounds. Extremities: no cyanosis, clubbing, rash, 1+ extremity edema Neuro: alert & orientedx3, cranial nerves grossly intact. moves all 4 extremities w/o difficulty. Affect pleasant  Telemetry: SR   Labs: Basic Metabolic Panel:  Recent Labs Lab 02/20/15 2111 02/21/15 1201  NA 144 145  K 4.5 3.5  CL 107 110  CO2 24 25  GLUCOSE 118* 97  BUN 116* 97*  CREATININE 2.21* 2.10*  CALCIUM 9.0 8.9    Liver Function Tests:  Recent Labs Lab 02/20/15 2111  AST 22  ALT 11*  ALKPHOS 78  BILITOT 0.3  PROT 6.5  ALBUMIN 3.1*    Recent Labs Lab 02/20/15 2111  LIPASE 29   No results for input(s): AMMONIA in the last 168 hours.  CBC:  Recent Labs Lab 02/20/15 2111  WBC 9.7  HGB 5.5*  HCT 17.4*  MCV 78.0  PLT 178    Cardiac Enzymes: No results for input(s): CKTOTAL, CKMB, CKMBINDEX, TROPONINI in the last 168 hours.  BNP: BNP (last 3 results)  Recent Labs  12/23/14 1226  01/21/15 1206 01/30/15 1200  BNP 570.5* 449.8* 491.3*    ProBNP (last 3 results) No results for input(s): PROBNP in the last 8760 hours.   CBG: No results for input(s): GLUCAP in the last 168 hours.  Coagulation Studies: No results for input(s): LABPROT, INR in the last 72 hours.  Other results: EKG: SR 80 bpm   Imaging: Dg Chest Port 1 View  02/20/2015  CLINICAL DATA:  Acute onset of generalized chest pain and anemia. Generalized weakness. Initial encounter. EXAM: PORTABLE CHEST 1 VIEW COMPARISON:  Chest radiograph performed 12/24/2014 FINDINGS: The lungs are well-aerated. Mild right basilar and left midlung airspace opacities raise concern for mild pneumonia. There is no evidence of pleural effusion or pneumothorax. The cardiomediastinal silhouette is borderline enlarged. No acute osseous abnormalities are seen. IMPRESSION: Mild right basilar and left midlung airspace opacities raise concern for mild pneumonia. Borderline cardiomegaly. Electronically Signed   By: Garald Balding M.D.   On: 02/20/2015 22:25   Ct Renal Stone Study  02/20/2015  CLINICAL DATA:  Generalized abdominal pain, swelling, diarrhea. Bright red blood per rectum beginning yesterday. Severe anemia. EXAM: CT ABDOMEN AND PELVIS WITHOUT CONTRAST TECHNIQUE: Multidetector CT imaging of the abdomen and pelvis was performed following the standard protocol without IV contrast. COMPARISON:  03/11/2014 FINDINGS: Lower chest: No acute findings. Previously seen pericardial effusion is resolved. Hepatobiliary: Probable small hepatic cysts appears stable. No definite liver masses visualized on this unenhanced exam. Gallbladder is unremarkable. Pancreas: No mass or inflammatory process identified on this un-enhanced exam. Spleen: Within normal limits in size. Adrenals/Urinary Tract: No evidence of urolithiasis or hydronephrosis. No definite mass visualized on this un-enhanced exam. Stomach/Bowel: No evidence of obstruction, inflammatory  process, or abnormal fluid collections. Large colonic stool burden noted. Vascular/Lymphatic: No pathologically enlarged lymph nodes. 9 mm left common iliac lymph node is decreased in size from 15 mm on previous study. No evidence of abdominal aortic aneurysm. Reproductive: Prior hysterectomy noted. Adnexal regions are unremarkable in appearance. Other: None. Musculoskeletal:  No suspicious bone lesions identified. IMPRESSION: No acute findings identified within the abdomen or pelvis. Large stool burden noted; suggest clinical correlation for possible constipation. Electronically Signed   By: Earle Gell M.D.   On: 02/20/2015 23:44      Medications:     Current Medications: . [MAR Hold] pantoprazole  40 mg Oral Q0600  . [MAR Hold] sodium chloride  3 mL Intravenous Q12H     Infusions: . sodium chloride 100 mL/hr at 02/21/15 0957  . sodium chloride        Assessment:  1.  Acute GI blled with symptomatic anemia -- Hgb 5.5 -->  2. 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 CKD III. Creatinine baseline 1.8-2.1  4. Hypothyroidism  5.PAF- has not been anticoagulated due to GI bleeding.  6. H/O Pericardial Effusion.    Plan/Discussion:   Ms Yusupov is a 65 year old with cor pulmonale due to severe PAH admitted with GI bleed. Off all PAH meds for procedure.  Undergoing EGD this afternoon. On protonix.   Will need to restart PAH meds and diuretics once GI bleed resolved. Need to restart selexipag now ASAP due to possible rebound effect. Ambrisentan 10  mg daily, selexipag 1600 mcg twice a day, and adicira 40 mg daily. No further melena since last night. Received 3 UPRBCs with hgb up from 5.5 to 7.1.   Eventually can restart diuretics but would hold off for now with GI bleed.  Prior to admit she was on torsemide 60 mg twice a day + metolazone 2.5 mg once a week.    Length of Stay: 1  Amy Clegg NP-C  02/21/2015, 2:52 PM  Advanced Heart Failure Team Pager [phone]  (M-F; 7a - 4p)  Please contact [facility] for night-coverage after hours (4p -7a ) and weekends on [email or web address]  Patient seen and examined with Darrick Grinder, NP. We discussed all aspects of the encounter. I agree with the assessment and plan as stated above.   She has had acute GI bleed with severe anemia but symptomatically improved after RBC and IVF resuscitation. Now with mild volume overload.   She has very severe PAH. Given risk of rebound PAH need to restart selexapeg ASAP. We discussed with IM service as well as pharmacy team and her sister is now brining it in from home. Would also continue letairis and adcirca as BP tolerates.   Once more stable would begin gentle diuresis.   Management of GIB per GI and primary team.   We will follow.  Bensimhon, Daniel,MD 4:20 PM

## 2015-02-21 NOTE — Progress Notes (Signed)
Utilization review completed

## 2015-02-21 NOTE — Progress Notes (Signed)
TRANSFER NOTE  [name] is a 64 y.o. 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 She states the bleeding started yesterday AM with some more mild bleeding and progressed throughout the day with a large amount of blood and associated weakness, fatigue, dizziness, and lightheadedness. She notified a family member who then brought her to the ED. She also admits to associated LLQ pain, unable to describe the quality of the pain, but states the severity is about an 8/10. She denies any fever, chills, or vomiting. She states the blood in her stool is bright red in appearance, denies any melena. No hematemesis. When interviewing the patient, she denies any SOB or chest pain, however, per chart review, she was complaining of chest pain initially. No cough.   Patient has a previous history of upper GI bleed on 03/2014 in which she was admitted, received blood products, and EGD showing duodenal AVM's for which she received ablation.   [name] also has severe PAH and is followed by Dr. Lamonte Sakai (pulmonary) and Dr. Aundra Dubin (CHF). She is currently receiving Adcirca, Ambrisentan, Selexipag, Amiodarone, Metolazone, and Torsemide.   On admission, Hb 5.5, SBP in the 80-90's, although patient has chronically low BP, no tachycardia.    Subjective: Patient is feeling better after 3 units PRBC's. States she is hungry. Still with some mild LLQ pain. No nausea or vomiting. Feeling weak and tired.   Objective: Vital signs in last 24 hours: Filed Vitals:   02/21/15 0700 02/21/15 0711 02/21/15 0712 02/21/15 0745  BP: _0 88/48  Pulse: 70 68 67 70  Temp:  98.9 F (37.2 C)    TempSrc:      Resp: _1 Height:      Weight:      SpO2: 100% 100% 100% 100%    Weight change:   Intake/Output Summary (Last 24 hours) at 02/21/15 7253 Last data filed at 02/21/15 0644  Gross per 24 hour  Intake    606 ml  Output      0 ml  Net    606 ml   Physical Exam: General: [name], alert, cooperative, NAD. Resting comfortably.  HEENT: PERRL, EOMI. Moist mucus membranes.  Neck: Full range of motion without pain, supple, no lymphadenopathy or carotid bruits Lungs: Air entry equal bilaterally. Scattered rhonchi. No wheezes.  Heart: RRR, no murmurs, gallops, or rubs Abdomen: Soft, mild diffuse tenderness, (L>R), non-distended, BS + Extremities: No cyanosis, clubbing, or edema.  Neurologic: Alert & oriented x3, cranial nerves II-XII intact, strength grossly intact, sensation intact to light touch   Lab Results: Basic Metabolic Panel:  Recent Labs Lab 02/20/15 2111  NA 144  K 4.5  CL 107  CO2 24  GLUCOSE 118*  BUN 116*  CREATININE 2.21*  CALCIUM 9.0   Liver Function Tests:  Recent Labs Lab 02/20/15 2111  AST 22  ALT 11*  ALKPHOS 78  BILITOT 0.3  PROT 6.5  ALBUMIN 3.1*    Recent Labs Lab 02/20/15 2111  LIPASE 29   CBC:  Recent Labs Lab 02/20/15 2111  WBC 9.7  HGB 5.5*  HCT 17.4*  MCV 78.0  PLT 178   Urine Drug Screen: Drugs of Abuse  Component Value Date/Time   LABOPIA POSITIVE* 10/02/2010 1128   COCAINSCRNUR NONE DETECTED 10/02/2010 1128   LABBENZ NONE DETECTED 10/02/2010 1128   AMPHETMU NONE DETECTED 10/02/2010 1128   THCU NONE DETECTED 10/02/2010 1128   LABBARB NONE DETECTED 10/02/2010 1128    Urinalysis:  Recent Labs Lab 02/20/15 2145  COLORURINE YELLOW  LABSPEC 1.007  PHURINE 7.0  GLUCOSEU NEGATIVE  HGBUR LARGE*  BILIRUBINUR NEGATIVE  KETONESUR NEGATIVE  PROTEINUR NEGATIVE  NITRITE NEGATIVE  LEUKOCYTESUR TRACE*    Studies/Results: Dg Chest Port 1 View  02/20/2015  CLINICAL DATA:  Acute onset of generalized chest pain and anemia. Generalized weakness. Initial encounter. EXAM: PORTABLE  CHEST 1 VIEW COMPARISON:  Chest radiograph performed 12/24/2014 FINDINGS: The lungs are well-aerated. Mild right basilar and left midlung airspace opacities raise concern for mild pneumonia. There is no evidence of pleural effusion or pneumothorax. The cardiomediastinal silhouette is borderline enlarged. No acute osseous abnormalities are seen. IMPRESSION: Mild right basilar and left midlung airspace opacities raise concern for mild pneumonia. Borderline cardiomegaly. Electronically Signed   By: Garald Balding M.D.   On: 02/20/2015 22:25   Ct Renal Stone Study  02/20/2015  CLINICAL DATA:  Generalized abdominal pain, swelling, diarrhea. Bright red blood per rectum beginning yesterday. Severe anemia. EXAM: CT ABDOMEN AND PELVIS WITHOUT CONTRAST TECHNIQUE: Multidetector CT imaging of the abdomen and pelvis was performed following the standard protocol without IV contrast. COMPARISON:  03/11/2014 FINDINGS: Lower chest: No acute findings. Previously seen pericardial effusion is resolved. Hepatobiliary: Probable small hepatic cysts appears stable. No definite liver masses visualized on this unenhanced exam. Gallbladder is unremarkable. Pancreas: No mass or inflammatory process identified on this un-enhanced exam. Spleen: Within normal limits in size. Adrenals/Urinary Tract: No evidence of urolithiasis or hydronephrosis. No definite mass visualized on this un-enhanced exam. Stomach/Bowel: No evidence of obstruction, inflammatory process, or abnormal fluid collections. Large colonic stool burden noted. Vascular/Lymphatic: No pathologically enlarged lymph nodes. 9 mm left common iliac lymph node is decreased in size from 15 mm on previous study. No evidence of abdominal aortic aneurysm. Reproductive: Prior hysterectomy noted. Adnexal regions are unremarkable in appearance. Other: None. Musculoskeletal:  No suspicious bone lesions identified. IMPRESSION: No acute findings identified within the abdomen or pelvis. Large  stool burden noted; suggest clinical correlation for possible constipation. Electronically Signed   By: Earle Gell M.D.   On: 02/20/2015 23:44   Medications: I have reviewed the patient's current medications. Scheduled Meds:  Continuous Infusions: . lactated ringers 125 mL/hr at 02/21/15 0439   PRN Meds:.albuterol   Assessment/Plan: 64 y.o. 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 Denies hematemesis or melena, only hematochezia. Has had GI bleed in the past, as recently as 03/2014 at which time she received some blood products and had EGD which revealed duodenal AVM's which were ablated at that time. Also had colonoscopy in 09/2013 for fecal incontinence, discovered multiple pan-colonic AVM's, internal hemorrhoids, and biopsy proven microscopic colitis. Unclear where site of bleeding is at this time. BUN is disproportionately elevated which would raise question of UGIB, however, the absence of melena and hematemesis would make a lower source more likely unless this is a brisk upper GI  bleed. Vitals generally stable, SBP in the 80's however, per chart, review, blood pressure tends to run quite low. No tachycardia, breathing comfortably. Hb 5.5 on admission, baseline appears to be closer to ~9.0. Patient has a known history of iron deficiency anemia for which she has been receiving IV iron infusions per Dr. Lindi Adie with heme/onc. Most recent previous CBC was 8.6 as of 01/24/15. Given 3 units PRBC's in the ED, started on gentle hydration.  -Admit to stepdown -GI consult appreciated; will likely need EGD/colonoscopy today/tomorrow -Recheck CBC s/p 3 units PRBC's, then  q8h -IVF; gentle hydration given severe PAH and cor pulmonale; NS @ 100 cc/hr for 12 hours.  -NPO for now, start clears if no scope today -Protonix 40 mg po qd (already received 80 IV on admission)  RLL/LML Infiltrate vs Atelectasis on CXR: Radiology report suspicious for pneumonia. Patient denies symptoms of SOB or cough. No fever, chills, or leukocytosis, although 9.7 is slightly higher than her baseline ~5-6. Started on Doxycycline IV per hospitalist during initial assessment. With review of several previous CXR's, patient seems to have a persistent infiltrate/atelectasis in the RLL.  -Discontinue Doxy given low likelihood of pneumonia at this time -Repeat CBC in AM; low threshold to reconsider ABx coverage if leukocytosis and pulmonary symptoms.   Acute on CKD stage 3: Baseline Cr closer to ~1.7, 2.21 on admission, most likely prerenal related to hypovolemia 2/2 GIB. BUN disproportionately elevated to 116 as discussed above.  -Gentle hydration as above -Repeat BMP this AM  PAH/Cor Pulmonale: Patient with severe PAH, on multiple medications for this, including Adcirca, Ambrisentan, and Selexipag. Follows closely with Dr. Lamonte Sakai (pulm) and Dr. Aundra Dubin (CHF). Most recent ECHO from 12/23/14 shows PAP of 76 mm Hg, EF of 60-65%, but with moderately dilated RV and severe tripcuspid regurgitation supportive of severe PAH and right-sided heart failure. Seen as recently as 01/30/15 in CHF clinic, scheduled to see Dr. Aundra Dubin today per patient. Currently taking Ambrisentan 10 mg daily, Selexipag 1600 mcg bid, and Adcirca 40 mg daily, as well as Torsemide 60 mg bid + Metolazone 2.5 mg q weekly. Patient weight recorded as 147 lbs today, same as previous CHF clinic appointment. Does not appear significantly volume overloaded on exam.  -Hold Adcirca, Ambrisentan, and Selexipag for now while npo. If Selexipag is not given within next 3 days, will need to retitrate per recommendations.  -Hold Torsemide + Metolazone  for now, restart when GI issues resolved.  -Daily weights, intake/output  Hypothyroidism: Mot recent TSH on 12/26/14 was 8.492, some consideration that this is 2/2 Amiodarone per CHF clinic notes.  -Repeat TSH per CHF team -Hold Synthroid 25 mcg for now, restart when able to take po pending GI procedure  PAF: Currently NSR. No recent palpitations per patient. On Amiodarone.  -Hold Amiodarone for now. Restart pending GI procedure  Peripheral Neuropathy: EMG studies from 08/29/14 confirmed sensorimotor polyneuropathy, most likely related to DM type II.  -Hold Pamelor + Lyrica for now, restart when able to take po pending GI  Scleroderma: Follows with Dr. Amil Amen per chart review. Not currently on steroids or medications for this.  -Outpatient follow up  DVT/PE PPx: SCD's  Dispo: Disposition is deferred at this time, awaiting improvement of current medical problems.  Anticipated discharge in approximately 2-3 day(s).   The patient does have a current PCP Sid Falcon, MD) and does need an [facility] hospital follow-up appointment after discharge.  The patient does not have transportation limitations that hinder transportation to clinic appointments.  Marland Kitchen  Services Needed at time of discharge: Y = Yes, Blank = No PT:   OT:   RN:   Equipment:   Other:     LOS: 1 day   Corky Sox, MD 02/21/2015, 8:28 AM

## 2015-02-21 NOTE — ED Notes (Signed)
Pt updated on NPO status

## 2015-02-21 NOTE — ED Notes (Signed)
Blood bank called by secretary to follow up on pt.'s blood for transfusion .

## 2015-02-21 NOTE — Interval H&P Note (Signed)
History and Physical Interval Note:  02/21/2015 4:23 PM  [name]  has presented today for surgery, with the diagnosis of gi bleed, anemia  The various methods of treatment have been discussed with the patient and family. After consideration of risks, benefits and other options for treatment, the patient has consented to  Procedure(s): ESOPHAGOGASTRODUODENOSCOPY (EGD) (N/A) as a surgical intervention .  The patient's history has been reviewed, patient examined, no change in status, stable for surgery.  I have reviewed the patient's chart and labs.  Questions were answered to the patient's satisfaction.     Pricilla Riffle. [place] Plan

## 2015-02-21 NOTE — H&P (View-Only) (Signed)
[facility] Consult: 8:23 AM 02/21/2015  LOS: 1 day    Referring Provider: Dr Lynnae January.  Primary Care Physician:  Gilles Chiquito, MD Primary Gastroenterologist:  Dr. Deatra Ina     Reason for Consultation:  Anemia, GI bleed   HPI: [name] is a 64 y.o. female.  Hx scleroderma/systemic sclerosis, severe pulmonary htn/cor pulmonale.  Diastolic heart failure.  Pericardial effusion with tamponase and pericardiocentesis 04/2014. Type 2 DM.  PAF with RVR but not an AC candidate. SS trait.  Anemia.  Diabetic neuropathy and nephropathy. CKD.Hypothyroidism. Anemia requiring transfusions as recently as 2 PRBC 12/23/14 when Hgb 5.8 and iron and iron sat low, transfused 4 PRBCs 03/2014.  Received Feraheme in 12/2014, discharged on po iron which was subsequently discontinued by hematologist Dr Lindi Adie who ordered iron infusion #2 on 01/29/2015.  Repeat iron studies/CBC set for 04/25/2015.  Dr Lindi Adie  suspects iron malabsorption.    Had not been compliant with PO iron and intermittently with her meds (2 weeks of no meds led to admission in 12/2014) 03/2014 EGD for melena.  Dr Fuller Plan.  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  09/2013 Colonoscopy for fecal incontinence Faint erythema throughout the colon. AVMs seen throughout the colon but more prominent on the left.  Internal hemorrhoids. Pathology: microscopic colitis 11/2011 colonoscopy ??, unable to locate reports.  02/2010 EGD to third portion of duodenum  For dysphagia and history scleroderma. Megaesophagus throughout entire esophagus. No stricture noted but Maloney dilatation performed. 08/2008 EGD by Dr. Jim Desanctis.  For complaint of abdominal pain. Decreased esophageal peristalsis noted but grossly normal mucosa of the esophagus, stomach and small bowel. 08/2008 colonoscopy  For  abdominal pain. Internal hemorrhoids noted. Otherwise normal study. 1994 EGD. By Dr. Deatra Ina Noted gastritis/esophagitis.  [name] baseline is inactive. She mostly sleeps at night and sits in a chair during the day. With any moderate activity she becomes short of breath but this is her normal baseline. Her appetite is diminished. No nausea or vomiting. Developed bloody BMs in AM 12/7, similar to 03/2014.  Initially there was some cramping abdominal discomfort which quickly resolved with the bowel movements. She had bloody BMs throughout the day. The last episode was yesterday at about 6 PM. Hgb 5.5, baseline:7 to 8   PT/INR normal. Has AKI (115/2.21 c/w 64/1.9 on 01/30/15) CT scan with large volume stool in colon.   No ETOH. No NSAIDs/ASA.  Appears to be compliant with her daily Nexium and other multiple meds.     Past Medical History  Diagnosis Date  . Secondary pulmonary hypertension ([place])     a. 2/2 scleroderma  . GERD (gastroesophageal reflux disease)   . Systemic sclerosis ([place])   . Unspecified essential hypertension   . Gastroparesis   . Sickle cell trait ([place])   . Obesity   . Scleroderma ([place])   . Trichomonas   . Peripheral neuropathy ([place])   . Type II diabetes mellitus ([facility])     a. diet control   . Arthritis   . Chronic kidney disease   . Gout   .  Hypothyroidism     a. may be due to amiodarone use. started on synthroid on 12/2014 admission   . Anemia     a.  hx of GI bleed and duodenal AVMs  . Chronic diastolic CHF (congestive heart failure) ([place])     a. Echo 12/23/14 withEF 60-65%, moderately dilated RV, PASP 72, no pericardial effusion.  Marland Kitchen PAF (paroxysmal atrial fibrillation) ([facility])     a. not a long term AC candidate due to GI bleeds    Past Surgical History  Procedure Laterality Date  . Tubal ligation    . Esophagogastroduodenoscopy  02/23/2010    D 2 AVM, ablated with APC.   Marland Kitchen Shoulder arthroscopy Right 12/2009    subacromial decompression  .  Replacement total knee Left 11/2000  . Cardiac catheterization Right 04/24/2004  . Tonsillectomy  1960's  . Abdominal hysterectomy  1983    "partial"  . Shoulder arthroscopy w/ rotator cuff repair Right 01/2010  . Excisional total knee arthroplasty with antibiotic spacers Left 08/2010    "got infected & had to take 1st replacement out"  . Revision total knee arthroplasty Left 11/2010    "removed spacers; replaced knee"  . Total knee revision with scar debridement/patella revision with poly exchange Left 12/2010    fell; knee split opened; had to redo revision"  . Cardiac catheterization Left 07/2010  . Peripherally inserted central catheter insertion  09/2010  . Colonoscopy  09/06/2008, 09/2013    int rrhoids:Dr. Lajoyce Corners 2010. Pan-colonic AVMs, microscopic colitis per Dr Deatra Ina 2015  . Right heart catheterization N/A 03/18/2014    Procedure: RIGHT HEART CATH;  Surgeon: Larey Dresser, MD;  Location: [facility] CATH LAB;  Service: Cardiovascular;  Laterality: N/A;  . Esophagogastroduodenoscopy N/A 03/23/2014    Procedure: ESOPHAGOGASTRODUODENOSCOPY (EGD);  Surgeon: Ladene Artist, MD;  Location: [facility] ENDOSCOPY;  Service: Endoscopy;  Laterality: N/A;  . Pericardial tap N/A 05/03/2014    Procedure: PERICARDIAL TAP;  Surgeon: Sinclair Grooms, MD;  Location: [facility] CATH LAB;  Service: Cardiovascular;  Laterality: N/A;    Prior to Admission medications   Medication Sig Start Date End Date Taking? Authorizing Provider  ADCIRCA 20 MG TABS TAKE 2 TABLETS (40MG) BY MOUTH ONCE DAILY WITH OR WITHOUT FOOD. CALL [phone] FOR REFILLS. 06/13/14  Yes Collene Gobble, MD  ambrisentan (LETAIRIS) 10 MG tablet Take 1 tablet (10 mg total) by mouth daily. 11/25/14  Yes Collene Gobble, MD  amiodarone (PACERONE) 200 MG tablet Take 1 tablet (200 mg total) by mouth daily. 12/17/14  Yes Larey Dresser, MD  esomeprazole (NEXIUM) 40 MG capsule Take 40 mg by mouth 2 (two) times daily.    Yes Historical Provider, MD  fenofibrate 54 MG tablet  Take 1 tablet (54 mg total) by mouth daily. 03/25/14  Yes Erick Colace, NP  FLUoxetine (PROZAC) 20 MG capsule take 1 capsule by mouth every morning 02/14/15  Yes Sid Falcon, MD  HYDROcodone-acetaminophen [facility]) 10-325 MG per tablet Take 1 tablet by mouth every 6 (six) hours as needed for moderate pain.  07/17/12  Yes Historical Provider, MD  levothyroxine (SYNTHROID, LEVOTHROID) 25 MCG tablet Take 1 tablet (25 mcg total) by mouth daily before breakfast. 12/28/14  Yes Eileen Stanford, PA-C  magnesium oxide (MAG-OX) 400 MG tablet Take 400 mg by mouth 2 (two) times daily.   Yes Historical Provider, MD  metolazone (ZAROXOLYN) 2.5 MG tablet Take 1 tablet (2.5 mg total) by mouth once a week. On Fridays 01/30/15  Yes Amy D Clegg, NP  nortriptyline (PAMELOR) 10 MG capsule Take 20 mg by mouth at bedtime.   Yes Historical Provider, MD  potassium chloride SA (K-DUR,KLOR-CON) 20 MEQ tablet Take 20 mEq by mouth daily.   Yes Historical Provider, MD  pregabalin (LYRICA) 150 MG capsule Take 1 capsule (150 mg total) by mouth 3 (three) times daily. 08/19/14  Yes Marcial Pacas, MD  Selexipag (UPTRAVI) 1600 MCG TABS Take 1,600 mcg by mouth 2 (two) times daily. 10/22/14  Yes Jolaine Artist, MD  torsemide (DEMADEX) 20 MG tablet Take 3 tablets (60 mg total) by mouth 2 (two) times daily. 01/21/15  Yes Amy D Ninfa Meeker, NP    Scheduled Meds:   Infusions: . lactated ringers 125 mL/hr at 02/21/15 0439   PRN Meds: albuterol   Allergies as of 02/20/2015 - Review Complete 02/20/2015  Allergen Reaction Noted  . Cephalexin Rash 05/01/2010  . Ciprofloxacin Rash 07/23/2010  . Codeine Other (See Comments)   . Contrast media [iodinated diagnostic agents] Hives 08/03/2011  . Iohexol Hives 03/04/2008    Family History  Problem Relation Age of Onset  . Heart disease Mother   . Diabetes Mother   . Diabetes Sister     Social History   Social History  . Marital Status: Legally Separated    Spouse Name: N/A  . Number  of Children: 2  . Years of Education: 11   Occupational History  . Umemployed   . Disability     Social History Main Topics  . Smoking status: Never Smoker   . Smokeless tobacco: Never Used  . Alcohol Use: No  . Drug Use: No  . Sexual Activity: Not on file   Other Topics Concern  . Not on file   Social History Narrative    FAMILY HISTORY:  Significant for coronary artery disease and diabetes   Patient lives at home with granddaughter.    Patient has 2 children.    Patient has 11 years of education.    Patient is on disability.    Patient is right handed.    Patient is separated.      REVIEW OF SYSTEMS: Constitutional:   weak, inactive. No recent falls.  ENT:  No nose bleeds Pulm:   Chronic DOE, stable CV:  No palpitations, no LE edema.  no chest pain GU:  No hematuria, no frequency GI:  No dysphagia or heartburn.per HPI Heme:  Per HPI   Transfusions:  Per HPI Neuro:  No headaches, no peripheral tingling or numbness Derm:  No itching, no rash or sores.  Endocrine:  No sweats or chills.  No polyuria or dysuria Immunization:  Reviewed.  Flu shot current Travel:  None beyond local counties in last few months.    PHYSICAL EXAM: Vital signs in last 24 hours: Filed Vitals:   02/21/15 0712 02/21/15 0745  BP: 97/43 88/48  Pulse: 67 70  Temp:    Resp: 17 20   Wt Readings from Last 3 Encounters:  02/20/15 66.679 kg (147 lb)  01/30/15 66.951 kg (147 lb 9.6 oz)  01/24/15 67.042 kg (147 lb 12.8 oz)    General: Pleasant, somnolent, chronically ill and frail appearing. Comfortable Head:   No facial asymmetry or swelling. No signs of head trauma.  Eyes:   No scleral icterus. Conjunctiva is pale. ENT:  Hearing intact, question somewhat diminished.  Nose:   no congestion.  No discharge Mouth:   moist, clear oral mucosa.  Edentulous. Dentures not in place.  Neck:  No mass, no JVD. Chest:  Clear but greatly diminished bilaterally. No cough or labored breathing.  Heart:  RRR. No MRG. S1/S2 audible. Abdomen:  soft. Not distended or protuberant. No HSM, no masses. Not tender. Bowel sounds hypoactive but no high-pitched or tinkling sounds..   Rectal: [place], pasty stool. No masses.   Musc/Skeltl: No joint contractures deformities, swelling or erythema Extremities: CCE.  Neurologic:  Somewhat difficult to arouse but oriented 3. Somnolent. Moves all 4 limbs, limb strength not tested. No tremor. No asterixis.  Skin:  No open sores or rashes.  Tattoos: none observed Nodes:  No cervical or inguinal adenopathy    Psych:  Cooperative, calm, somnolent  Intake/Output from previous day: 12/08 0701 - 12/09 0700 In: [place] Out: -  Intake/Output this shift:    LAB RESULTS:  Recent Labs  02/20/15 2111  WBC 9.7  HGB 5.5*  HCT 17.4*  PLT 178   BMET Lab Results  Component Value Date   NA 144 02/20/2015   NA 144 01/30/2015   NA 142 01/21/2015   K 4.5 02/20/2015   K 4.4 01/30/2015   K 3.9 01/21/2015   CL 107 02/20/2015   CL 107 01/30/2015   CL 108 01/21/2015   CO2 24 02/20/2015   CO2 24 01/30/2015   CO2 21* 01/21/2015   GLUCOSE 118* 02/20/2015   GLUCOSE 70 01/30/2015   GLUCOSE 69 01/21/2015   BUN 116* 02/20/2015   BUN 64* 01/30/2015   BUN 49* 01/21/2015   CREATININE 2.21* 02/20/2015   CREATININE 1.99* 01/30/2015   CREATININE 1.67* 01/21/2015   CALCIUM 9.0 02/20/2015   CALCIUM 9.5 01/30/2015   CALCIUM 9.3 01/21/2015   LFT  Recent Labs  02/20/15 2111  PROT 6.5  ALBUMIN 3.1*  AST 22  ALT 11*  ALKPHOS 78  BILITOT 0.3   PT/INR Lab Results  Component Value Date   INR 1.19 03/17/2014   INR 1.46 12/26/2013   INR 1.68* 06/08/2013   PROTIME 18.7 08/20/2008   Hepatitis Panel No results for input(s): HEPBSAG, HCVAB, HEPAIGM, HEPBIGM in the last 72 hours. C-Diff No components found for: CDIFF Lipase     Component Value Date/Time   LIPASE 29 02/20/2015 2111    Drugs of Abuse     Component Value Date/Time   LABOPIA  POSITIVE* 10/02/2010 1128   COCAINSCRNUR NONE DETECTED 10/02/2010 1128   LABBENZ NONE DETECTED 10/02/2010 1128   AMPHETMU NONE DETECTED 10/02/2010 1128   THCU NONE DETECTED 10/02/2010 1128   LABBARB NONE DETECTED 10/02/2010 1128     RADIOLOGY STUDIES: Dg Chest Port 1 View  02/20/2015  CLINICAL DATA:  Acute onset of generalized chest pain and anemia. Generalized weakness. Initial encounter. EXAM: PORTABLE CHEST 1 VIEW COMPARISON:  Chest radiograph performed 12/24/2014 FINDINGS: The lungs are well-aerated. Mild right basilar and left midlung airspace opacities raise concern for mild pneumonia. There is no evidence of pleural effusion or pneumothorax. The cardiomediastinal silhouette is borderline enlarged. No acute osseous abnormalities are seen. IMPRESSION: Mild right basilar and left midlung airspace opacities raise concern for mild pneumonia. Borderline cardiomegaly. Electronically Signed   By: Garald Balding M.D.   On: 02/20/2015 22:25   Ct Renal Stone Study  02/20/2015  CLINICAL DATA:  Generalized abdominal pain, swelling, diarrhea. Bright red blood per rectum beginning yesterday. Severe anemia. EXAM: CT ABDOMEN AND PELVIS WITHOUT CONTRAST TECHNIQUE: Multidetector CT imaging of the abdomen and pelvis was performed following the standard protocol without IV contrast. COMPARISON:  03/11/2014  FINDINGS: Lower chest: No acute findings. Previously seen pericardial effusion is resolved. Hepatobiliary: Probable small hepatic cysts appears stable. No definite liver masses visualized on this unenhanced exam. Gallbladder is unremarkable. Pancreas: No mass or inflammatory process identified on this un-enhanced exam. Spleen: Within normal limits in size. Adrenals/Urinary Tract: No evidence of urolithiasis or hydronephrosis. No definite mass visualized on this un-enhanced exam. Stomach/Bowel: No evidence of obstruction, inflammatory process, or abnormal fluid collections. Large colonic stool burden noted.  Vascular/Lymphatic: No pathologically enlarged lymph nodes. 9 mm left common iliac lymph node is decreased in size from 15 mm on previous study. No evidence of abdominal aortic aneurysm. Reproductive: Prior hysterectomy noted. Adnexal regions are unremarkable in appearance. Other: None. Musculoskeletal:  No suspicious bone lesions identified. IMPRESSION: No acute findings identified within the abdomen or pelvis. Large stool burden noted; suggest clinical correlation for possible constipation. Electronically Signed   By: Earle Gell M.D.   On: 02/20/2015 23:44    ENDOSCOPIC STUDIES: See HPI  IMPRESSION:   *  Recurrent GI bleed.  03/2014 GI bleed, melena due to D2 AVM, ablated at EGD.  Non-bleeding AVMs revealed on previous colonoscopy.   *  ABL and chronic anemia.  Patient just completed third of 3 ordered PRBCs.  Dr Lindi Adie has been overseeing parenteral iron infusions, s/p 2 infusions in 12/2014 and 01/2015.   *  AKI, CKD.    *  Cor pulmonale and right heart failure.       PLAN:     *   ? Repeat EGD, colonoscopy?    *    Allow clears if no plans to scope pt today.   *    Po Protonix.  Does not need ppi drip.    Azucena Freed  02/21/2015, 8:23 AM Pager: [phone]      Attending physician's note   I have taken a history, examined the patient and reviewed the chart. I agree with the Advanced Practitioner's note, impression and recommendations. Recurrent GI bleed very likely due to known gastrointestinal AVMs. Transfuse to keep Hb >8. Continue Fe replacement long term as we should anticipate ongoing chronic GI blood loss. EGD today for further evaluation.  Lucio Edward, MD [facility] [phone] Mon-Fri 8a-5p [phone] after 5p, weekends, holidays

## 2015-02-21 NOTE — Progress Notes (Signed)
Care transferred to Int Medicine Teaching service, spoke with Dr Ronnald Ramp of the teaching service.

## 2015-02-22 DIAGNOSIS — I509 Heart failure, unspecified: Secondary | ICD-10-CM

## 2015-02-22 LAB — CBC
HCT: 21.3 % — ABNORMAL LOW (ref 36.0–46.0)
HCT: 25 % — ABNORMAL LOW (ref 36.0–46.0)
HEMATOCRIT: 21 % — AB (ref 36.0–46.0)
HEMATOCRIT: 25 % — AB (ref 36.0–46.0)
Hemoglobin: 7.1 g/dL — ABNORMAL LOW (ref 12.0–15.0)
Hemoglobin: 7 g/dL — ABNORMAL LOW (ref 12.0–15.0)
Hemoglobin: 8.2 g/dL — ABNORMAL LOW (ref 12.0–15.0)
Hemoglobin: 8.3 g/dL — ABNORMAL LOW (ref 12.0–15.0)
MCH: 27.1 pg (ref 26.0–34.0)
MCH: 27.2 pg (ref 26.0–34.0)
MCH: 27.7 pg (ref 26.0–34.0)
MCH: 28.1 pg (ref 26.0–34.0)
MCHC: 32.8 g/dL (ref 30.0–36.0)
MCHC: 33.2 g/dL (ref 30.0–36.0)
MCHC: 33.3 g/dL (ref 30.0–36.0)
MCHC: 33.3 g/dL (ref 30.0–36.0)
MCV: 81.6 fL (ref 78.0–100.0)
MCV: 81.4 fL (ref 78.0–100.0)
MCV: 84.7 fL (ref 78.0–100.0)
MCV: 84.5 fL (ref 78.0–100.0)
PLATELETS: 116 10*3/uL — AB (ref 150–400)
PLATELETS: 124 10*3/uL — AB (ref 150–400)
Platelets: 116 10*3/uL — ABNORMAL LOW (ref 150–400)
Platelets: 102 10*3/uL — ABNORMAL LOW (ref 150–400)
RBC: 2.58 MIL/uL — ABNORMAL LOW (ref 3.87–5.11)
RBC: 2.61 MIL/uL — AB (ref 3.87–5.11)
RBC: 2.95 MIL/uL — ABNORMAL LOW (ref 3.87–5.11)
RBC: 2.96 MIL/uL — ABNORMAL LOW (ref 3.87–5.11)
RDW: 18.9 % — AB (ref 11.5–15.5)
RDW: 19.4 % — AB (ref 11.5–15.5)
RDW: 19.5 % — ABNORMAL HIGH (ref 11.5–15.5)
RDW: 19.7 % — ABNORMAL HIGH (ref 11.5–15.5)
WBC: 6.9 10*3/uL (ref 4.0–10.5)
WBC: 7.5 10*3/uL (ref 4.0–10.5)
WBC: 6.2 10*3/uL (ref 4.0–10.5)
WBC: 7 10*3/uL (ref 4.0–10.5)

## 2015-02-22 LAB — BASIC METABOLIC PANEL
Anion gap: 7 (ref 5–15)
BUN: 79 mg/dL — ABNORMAL HIGH (ref 6–20)
CO2: 24 mmol/L (ref 22–32)
Calcium: 8.7 mg/dL — ABNORMAL LOW (ref 8.9–10.3)
Chloride: 112 mmol/L — ABNORMAL HIGH (ref 101–111)
Creatinine, Ser: 1.86 mg/dL — ABNORMAL HIGH (ref 0.44–1.00)
GFR calc Af Amer: 32 mL/min — ABNORMAL LOW (ref >60)
GFR, EST NON AFRICAN AMERICAN: 28 mL/min — AB (ref >60)
Glucose, Bld: 87 mg/dL (ref 65–99)
POTASSIUM: 3.3 mmol/L — AB (ref 3.5–5.1)
Sodium: 143 mmol/L (ref 135–145)

## 2015-02-22 LAB — PROTIME-INR
INR: 1.32 (ref 0.00–1.49)
PROTHROMBIN TIME: 16.5 s — AB (ref 11.6–15.2)

## 2015-02-22 LAB — RETICULOCYTES
RBC.: 2.66 MIL/uL — AB (ref 3.87–5.11)
RETIC CT PCT: 4.9 % — AB (ref 0.4–3.1)
Retic Count, Absolute: 130.3 10*3/uL (ref 19.0–186.0)

## 2015-02-22 LAB — APTT: aPTT: 32 seconds (ref 24–37)

## 2015-02-22 LAB — TECHNOLOGIST SMEAR REVIEW

## 2015-02-22 LAB — LACTATE DEHYDROGENASE: LDH: 112 U/L (ref 98–192)

## 2015-02-22 LAB — BILIRUBIN, TOTAL: Total Bilirubin: 0.7 mg/dL (ref 0.3–1.2)

## 2015-02-22 LAB — PREPARE RBC (CROSSMATCH)

## 2015-02-22 MED ORDER — LEVOTHYROXINE SODIUM 25 MCG PO TABS
25.0000 ug | ORAL_TABLET | Freq: Every day | ORAL | Status: DC
  Administered 2015-02-23 – 2015-02-26 (×4): 25 ug via ORAL
  Filled 2015-02-22 (×4): qty 1

## 2015-02-22 MED ORDER — POTASSIUM CHLORIDE CRYS ER 20 MEQ PO TBCR: 20.0000 meq | EXTENDED_RELEASE_TABLET | Freq: Every day | ORAL | Status: DC

## 2015-02-22 MED ORDER — SODIUM CHLORIDE 0.9 % IV SOLN: Freq: Once | INTRAVENOUS | Status: DC

## 2015-02-22 MED ORDER — POTASSIUM CHLORIDE CRYS ER 20 MEQ PO TBCR: 30.0000 meq | EXTENDED_RELEASE_TABLET | Freq: Once | ORAL | Status: DC

## 2015-02-22 MED ORDER — SODIUM CHLORIDE 0.9 % IV SOLN: INTRAVENOUS | Status: DC

## 2015-02-22 MED ORDER — POTASSIUM CHLORIDE CRYS ER 20 MEQ PO TBCR: 30.0000 meq | EXTENDED_RELEASE_TABLET | Freq: Two times a day (BID) | ORAL | Status: DC

## 2015-02-22 MED ORDER — AMIODARONE HCL 200 MG PO TABS
200.0000 mg | ORAL_TABLET | Freq: Every day | ORAL | Status: DC
  Administered 2015-02-22 – 2015-02-26 (×5): 200 mg via ORAL
  Filled 2015-02-22 (×5): qty 1

## 2015-02-22 MED ORDER — TORSEMIDE 20 MG PO TABS: 60.0000 mg | ORAL_TABLET | Freq: Every day | ORAL | Status: DC

## 2015-02-22 MED ORDER — AMBRISENTAN 5 MG PO TABS
10.0000 mg | ORAL_TABLET | Freq: Every day | ORAL | Status: DC
  Administered 2015-02-22 – 2015-02-26 (×5): 10 mg via ORAL
  Filled 2015-02-22 (×6): qty 2

## 2015-02-22 NOTE — Progress Notes (Signed)
Daily Rounding Note  02/22/2015, 8:06 AM  LOS: 2 days   SUBJECTIVE:       Hematochezia this morning.  On clears and hungry to eat solids.   No pain.  Breathing at baseline  OBJECTIVE:         Vital signs in last 24 hours:    Temp:  [97.6 F (36.4 C)-99.7 F (37.6 C)] 97.6 F (36.4 C) (12/10 0726) Pulse Rate:  [25-95] 61 (12/10 0700) Resp:  [11-19] 15 (12/10 0700) BP: (66-96)/(20-67) 79/46 mmHg (12/10 0700) SpO2:  [87 %-100 %] 95 % (12/10 0700) Weight:  [62.2 kg (137 lb 2 oz)-62.8 kg (138 lb 7.2 oz)] 62.8 kg (138 lb 7.2 oz) (12/10 0313) Last BM Date: 02/20/15 [facility] Weights   02/20/15 2044 02/21/15 1220 02/22/15 0313  Weight: 66.679 kg (147 lb) 62.2 kg (137 lb 2 oz) 62.8 kg (138 lb 7.2 oz)   General: chronically ill looking, frail, alert, comfortable.    Heart: RRR Chest: crackles in left base.  No cough or dyspnea Abdomen: soft, NT, ND.  No mass or HSM.  Active BS  Extremities: + edema.   Neuro/Psych:  Oriented x 3.  No tremors.  Moves all 4s.  Limb strength not tested.   Intake/Output from previous day: 12/09 0701 - 12/10 0700 In: 3542.1 [P.O.:740; I.V.:2132.1; Blood:670] Out: 1000 [Urine:1000]  Intake/Output this shift:    Lab Results:  Recent Labs  02/21/15 1421 02/22/15 0057 02/22/15 0436  WBC 7.7 6.9 7.5  HGB 7.1* 7.1* 7.0*  HCT 21.6* 21.3* 21.0*  PLT 129* 124* 116*   BMET  Recent Labs  02/20/15 2111 02/21/15 1201 02/22/15 0436  NA 144 145 143  K 4.5 3.5 3.3*  CL 107 110 112*  CO2 _0 GLUCOSE 118* 97 87  BUN 116* 97* 79*  CREATININE 2.21* 2.10* 1.86*  CALCIUM 9.0 8.9 8.7*   LFT  Recent Labs  02/20/15 2111  PROT 6.5  ALBUMIN 3.1*  AST 22  ALT 11*  ALKPHOS 78  BILITOT 0.3   PT/INR No results for input(s): LABPROT, INR in the last 72 hours. Hepatitis Panel No results for input(s): HEPBSAG, HCVAB, HEPAIGM, HEPBIGM in the last 72 hours.  Studies/Results: Dg Chest  Port 1 View  02/20/2015  CLINICAL DATA:  Acute onset of generalized chest pain and anemia. Generalized weakness. Initial encounter. EXAM: PORTABLE CHEST 1 VIEW COMPARISON:  Chest radiograph performed 12/24/2014 FINDINGS: The lungs are well-aerated. Mild right basilar and left midlung airspace opacities raise concern for mild pneumonia. There is no evidence of pleural effusion or pneumothorax. The cardiomediastinal silhouette is borderline enlarged. No acute osseous abnormalities are seen. IMPRESSION: Mild right basilar and left midlung airspace opacities raise concern for mild pneumonia. Borderline cardiomegaly. Electronically Signed   By: Garald Balding M.D.   On: 02/20/2015 22:25   Ct Renal Stone Study  02/20/2015  CLINICAL DATA:  Generalized abdominal pain, swelling, diarrhea. Bright red blood per rectum beginning yesterday. Severe anemia. EXAM: CT ABDOMEN AND PELVIS WITHOUT CONTRAST TECHNIQUE: Multidetector CT imaging of the abdomen and pelvis was performed following the standard protocol without IV contrast. COMPARISON:  03/11/2014 FINDINGS: Lower chest: No acute findings. Previously seen pericardial effusion is resolved. Hepatobiliary: Probable small hepatic cysts appears stable. No definite liver masses visualized on this unenhanced exam. Gallbladder is unremarkable. Pancreas: No mass or inflammatory process identified on this un-enhanced exam. Spleen: Within normal limits in size. Adrenals/Urinary Tract: No evidence  of urolithiasis or hydronephrosis. No definite mass visualized on this un-enhanced exam. Stomach/Bowel: No evidence of obstruction, inflammatory process, or abnormal fluid collections. Large colonic stool burden noted. Vascular/Lymphatic: No pathologically enlarged lymph nodes. 9 mm left common iliac lymph node is decreased in size from 15 mm on previous study. No evidence of abdominal aortic aneurysm. Reproductive: Prior hysterectomy noted. Adnexal regions are unremarkable in appearance.  Other: None. Musculoskeletal:  No suspicious bone lesions identified. IMPRESSION: No acute findings identified within the abdomen or pelvis. Large stool burden noted; suggest clinical correlation for possible constipation. Electronically Signed   By: Earle Gell M.D.   On: 02/20/2015 23:44   Scheduled Meds: . pantoprazole  40 mg Oral Q0600  . potassium chloride  30 mEq Oral Once  . pregabalin  150 mg Oral TID  . Selexipag  1,600 mcg Oral BID  . sodium chloride  3 mL Intravenous Q12H   Continuous Infusions: . sodium chloride     PRN Meds:.albuterol, HYDROcodone-acetaminophen   ASSESMENT:   *  GIB and anemia.  Acute on chronic anemia.  03/2014 ablation duodenal avm.  Hgb stable S/p 3 PRBCs. AVMs on colonoscopy 09/2013 02/21/15 EGD: normal.   *  AKI.  Improved.   PLAN   *  Stop Protonix, no mucosal issues on yesterdays or January's EGD  *  Follows with hematologist Dr Lindi Adie for iron infusions and chronic anemia.   *  Consider inpatient colonoscopy    Azucena Freed  02/22/2015, 8:06 AM Pager: [phone]     Attending physician's note   I have taken an interval history, reviewed the chart and examined the patient. I agree with the Advanced Practitioner's note, impression and recommendations. Recurrent GI bleeding likely secondary to known colonic AVMs. Consider repeat colonoscopy this admission when is Hb > 8 and cardiopulmonary status is optimized. She is at higher risk for cardiopulmonary complications due to cor pulmonale, severe PAH. BP this am was 79/46. MAC for GI procedures would be best.    Lucio Edward, MD [facility] [phone] Mon-Fri 8a-5p [phone] after 5p, weekends, holidays

## 2015-02-22 NOTE — Progress Notes (Addendum)
Patient ID: LONIE RUMMELL, female   DOB: 1950/07/05, 64 y.o.   MRN: [id number]    SUBJECTIVE:  Ms Camey is a 64 year old with a history of DM2, HLD, HTN, pHTN, GERD, gastroparesis, scleroderma, DJD, cor pulmonale, severe PAH, hypothyroidism, and anemia (history of duodenal and colonic AVMs) admitted with GI bleed.   She is followed closely in the HF clinic and was last seen November 22nd. She was volume overloaded. She continued on torsemide 60 mg twice a day with metolazone added weekly. Pulmonary HTN regimen included: ambrisentan 10 mg, selexapag 1600 mcg bid, adcirca 40 mg daily. Weight at that visit was 147 pounds.  12/8 she had melena and presented to [facility] ED. Hgb on admit was 5.5 with blood per rectum. Received 3U PRBCs.  On 12/9, had EGD without any significant findings.   SBP in 90s this morning, this is her baseline.  She got another unit PRBCs today.  She had episode hematochezia this morning.   Scheduled Meds: . amiodarone  200 mg Oral Daily  . [START ON 02/23/2015] levothyroxine  25 mcg Oral QAC breakfast  . potassium chloride  30 mEq Oral Once  . [START ON 02/23/2015] potassium chloride SA  20 mEq Oral Daily  . pregabalin  150 mg Oral TID  . Selexipag  1,600 mcg Oral BID  . sodium chloride  3 mL Intravenous Q12H  . torsemide  60 mg Oral Daily   Continuous Infusions:  PRN Meds:.albuterol, HYDROcodone-acetaminophen   Filed Vitals:   02/22/15 1103 02/22/15 1115 02/22/15 1116 02/22/15 1118  BP: 94/52 96/52 97/50 91/53  Pulse: 122 60 55 58  Temp: 97.8 F (36.6 C)  97.5 F (36.4 C) 97.5 F (36.4 C)  TempSrc: Oral  Oral Oral  Resp: _0 Height:      Weight:      SpO2: 95% 97% 100% 100%    Intake/Output Summary (Last 24 hours) at 02/22/15 1157 Last data filed at 02/22/15 1000  Gross per 24 hour  Intake 3060.09 ml  Output   1000 ml  Net 2060.09 ml    LABS: Basic Metabolic Panel:  Recent Labs  02/21/15 1201 02/22/15 0436  NA 145 143  K 3.5 3.3*  CL  110 112*  CO2 25 24  GLUCOSE 97 87  BUN 97* 79*  CREATININE 2.10* 1.86*  CALCIUM 8.9 8.7*   Liver Function Tests:  Recent Labs  02/20/15 2111 02/22/15 1000  AST 22  --   ALT 11*  --   ALKPHOS 78  --   BILITOT 0.3 0.7  PROT 6.5  --   ALBUMIN 3.1*  --     Recent Labs  02/20/15 2111  LIPASE 29   CBC:  Recent Labs  02/22/15 0057 02/22/15 0436  WBC 6.9 7.5  HGB 7.1* 7.0*  HCT 21.3* 21.0*  MCV 81.6 81.4  PLT 124* 116*   Cardiac Enzymes: No results for input(s): CKTOTAL, CKMB, CKMBINDEX, TROPONINI in the last 72 hours. BNP: Invalid input(s): POCBNP D-Dimer: No results for input(s): DDIMER in the last 72 hours. Hemoglobin A1C: No results for input(s): HGBA1C in the last 72 hours. Fasting Lipid Panel: No results for input(s): CHOL, HDL, LDLCALC, TRIG, CHOLHDL, LDLDIRECT in the last 72 hours. Thyroid Function Tests:  Recent Labs  02/21/15 1201  TSH 3.314   Anemia Panel:  Recent Labs  02/22/15 1000  RETICCTPCT 4.9*    RADIOLOGY: Dg Chest Port 1 View  02/20/2015  CLINICAL DATA:  Acute onset of generalized chest pain and anemia. Generalized weakness. Initial encounter. EXAM: PORTABLE CHEST 1 VIEW COMPARISON:  Chest radiograph performed 12/24/2014 FINDINGS: The lungs are well-aerated. Mild right basilar and left midlung airspace opacities raise concern for mild pneumonia. There is no evidence of pleural effusion or pneumothorax. The cardiomediastinal silhouette is borderline enlarged. No acute osseous abnormalities are seen. IMPRESSION: Mild right basilar and left midlung airspace opacities raise concern for mild pneumonia. Borderline cardiomegaly. Electronically Signed   By: Garald Balding M.D.   On: 02/20/2015 22:25   Ct Renal Stone Study  02/20/2015  CLINICAL DATA:  Generalized abdominal pain, swelling, diarrhea. Bright red blood per rectum beginning yesterday. Severe anemia. EXAM: CT ABDOMEN AND PELVIS WITHOUT CONTRAST TECHNIQUE: Multidetector CT imaging of  the abdomen and pelvis was performed following the standard protocol without IV contrast. COMPARISON:  03/11/2014 FINDINGS: Lower chest: No acute findings. Previously seen pericardial effusion is resolved. Hepatobiliary: Probable small hepatic cysts appears stable. No definite liver masses visualized on this unenhanced exam. Gallbladder is unremarkable. Pancreas: No mass or inflammatory process identified on this un-enhanced exam. Spleen: Within normal limits in size. Adrenals/Urinary Tract: No evidence of urolithiasis or hydronephrosis. No definite mass visualized on this un-enhanced exam. Stomach/Bowel: No evidence of obstruction, inflammatory process, or abnormal fluid collections. Large colonic stool burden noted. Vascular/Lymphatic: No pathologically enlarged lymph nodes. 9 mm left common iliac lymph node is decreased in size from 15 mm on previous study. No evidence of abdominal aortic aneurysm. Reproductive: Prior hysterectomy noted. Adnexal regions are unremarkable in appearance. Other: None. Musculoskeletal:  No suspicious bone lesions identified. IMPRESSION: No acute findings identified within the abdomen or pelvis. Large stool burden noted; suggest clinical correlation for possible constipation. Electronically Signed   By: Earle Gell M.D.   On: 02/20/2015 23:44    PHYSICAL EXAM General: NAD Neck: JVP 10-12 cm, no thyromegaly or thyroid nodule.  Lungs: Clear to auscultation bilaterally with normal respiratory effort. CV: Nondisplaced PMI.  Heart regular S1/S2, no S3/S4, 2/6 HSM LLSB. 1+ ankle edema.   Abdomen: Soft, nontender, no hepatosplenomegaly, no distention.  Neurologic: Alert and oriented x 3.  Psych: Normal affect. Extremities: No clubbing or cyanosis.   TELEMETRY: Reviewed telemetry pt in NSR     Assessment:  1. 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 AVMs on colonoscopy 09/2013. 02/21/15 EGD: normal.  2. Cor Pulmonale due to severe PAH in the  setting of scleroderma  3. CKD III. Creatinine baseline 1.8-2.1  4. Hypothyroidism  5. PAF: has not been anticoagulated due to GI bleeding. Now in NSR.  6. H/O Pericardial Effusion.    Plan/Discussion:   Ms Willcox is a 64 year old with cor pulmonale due to severe PAH admitted with GI bleed. EGD did not show any abnormalities.  She had more hematochezia this morning and is getting another unit PRBCs.  GI appears to have signed off, have asked nursing to page PA => given history of colonic AVMs and hematochezia, think she likely should have colonoscopy this admission rather than waiting as outpatient.  Nursing paging GI to report ongoing hematochezia.   At home, she is on ambrisentan 10 mg daily, selexipag 1600 mcg twice a day, and adicira 40 mg daily for pulmonary hypertension.  She was started back on selexipag yesterday.  SBP in 90s, which is normal range for her (runs 80s-90s as outpatient).  - Can restart ambrisentan today, will hold off on Adcirca for now, reassess tomorrow.  Eventually can restart diuretics but would hold off for now with GI bleed. Prior to admit she was on torsemide 60 mg twice a day + metolazone 2.5 mg once a week.  Weight is up 1 lb, not short of breath.   [name] 02/22/2015 12:06 PM

## 2015-02-22 NOTE — Progress Notes (Signed)
Subjective:  She denies any light-headedness, shortness of breath or chest pain this morning. She reports a good appetite, but has not eaten her food due to taste. She denies any abdominal pain. She had one bowel movement overnight, but it was non-bloody to her knowledge.  Objective: Vital signs in last 24 hours: Filed Vitals:   02/22/15 0500 02/22/15 0600 02/22/15 0700 02/22/15 0726  BP: _0   Pulse: 57 57 61   Temp:    97.6 F (36.4 C)  TempSrc:    Oral  Resp: _1 Height:      Weight:      SpO2: 97% 94% 95%    Weight change: -9 lb 14 oz (-4.479 kg)  Intake/Output Summary (Last 24 hours) at 02/22/15 1009 Last data filed at 02/21/15 2020  Gross per 24 hour  Intake 3057.09 ml  Output   1000 ml  Net 2057.09 ml   General: Sitting up in bed, NAD HEENT: Moist mucous membranes Pulmonary: Fine basilar crackles on the right. No respiratory distress Cardiovascular: RRR, no m/r/g Abdomen: Soft, NT/ND. Normal bowel sounds Extremities: No clubbing, cyanosis, or edema  Lab Results: Basic Metabolic Panel:  Recent Labs Lab 02/21/15 1201 02/22/15 0436  NA 145 143  K 3.5 3.3*  CL 110 112*  CO2 25 24  GLUCOSE 97 87  BUN 97* 79*  CREATININE 2.10* 1.86*  CALCIUM 8.9 8.7*    Recent Labs Lab 02/20/15 2111  LIPASE 29   CBC:  Recent Labs Lab 02/22/15 0057 02/22/15 0436  WBC 6.9 7.5  HGB 7.1* 7.0*  HCT 21.3* 21.0*  MCV 81.6 81.4  PLT 124* 116*   Alcohol Level: No results for input(s): ETH in the last 168 hours. Urinalysis:  Recent Labs Lab 02/20/15 2145  COLORURINE YELLOW  LABSPEC 1.007  PHURINE 7.0  GLUCOSEU NEGATIVE  HGBUR LARGE*  BILIRUBINUR NEGATIVE  KETONESUR NEGATIVE  PROTEINUR NEGATIVE  NITRITE NEGATIVE  LEUKOCYTESUR TRACE*     Ref Range 2d ago (02/20/15) 27yrago (08/03/13) 176yrgo (06/07/13)     Squamous Epithelial / LPF NONE SEEN  0-5 (A) RARER RARER    WBC, UA 0 - 5 WBC/hpf 0-5 3-6 (A)R 0-2R    RBC / HPF 0 - 5  RBC/hpf 0-5 0-2R 0-2R    Bacteria, UA NONE SEEN  RARE (A) RARER MANY (A)R         Studies/Results: Dg Chest Port 1 View  02/20/2015  CLINICAL DATA:  Acute onset of generalized chest pain and anemia. Generalized weakness. Initial encounter. EXAM: PORTABLE CHEST 1 VIEW COMPARISON:  Chest radiograph performed 12/24/2014 FINDINGS: The lungs are well-aerated. Mild right basilar and left midlung airspace opacities raise concern for mild pneumonia. There is no evidence of pleural effusion or pneumothorax. The cardiomediastinal silhouette is borderline enlarged. No acute osseous abnormalities are seen. IMPRESSION: Mild right basilar and left midlung airspace opacities raise concern for mild pneumonia. Borderline cardiomegaly. Electronically Signed   By: JeGarald Balding.D.   On: 02/20/2015 22:25   Ct Renal Stone Study  02/20/2015  CLINICAL DATA:  Generalized abdominal pain, swelling, diarrhea. Bright red blood per rectum beginning yesterday. Severe anemia. EXAM: CT ABDOMEN AND PELVIS WITHOUT CONTRAST TECHNIQUE: Multidetector CT imaging of the abdomen and pelvis was performed following the standard protocol without IV contrast. COMPARISON:  03/11/2014 FINDINGS: Lower chest: No acute findings. Previously seen pericardial effusion is resolved. Hepatobiliary: Probable small hepatic cysts appears stable. No definite liver masses visualized on  this unenhanced exam. Gallbladder is unremarkable. Pancreas: No mass or inflammatory process identified on this un-enhanced exam. Spleen: Within normal limits in size. Adrenals/Urinary Tract: No evidence of urolithiasis or hydronephrosis. No definite mass visualized on this un-enhanced exam. Stomach/Bowel: No evidence of obstruction, inflammatory process, or abnormal fluid collections. Large colonic stool burden noted. Vascular/Lymphatic: No pathologically enlarged lymph nodes. 9 mm left common iliac lymph node is decreased in size from 15 mm on previous study. No evidence of  abdominal aortic aneurysm. Reproductive: Prior hysterectomy noted. Adnexal regions are unremarkable in appearance. Other: None. Musculoskeletal:  No suspicious bone lesions identified. IMPRESSION: No acute findings identified within the abdomen or pelvis. Large stool burden noted; suggest clinical correlation for possible constipation. Electronically Signed   By: Earle Gell M.D.   On: 02/20/2015 23:44   Medications: I have reviewed the patient's current medications. Scheduled Meds: . potassium chloride  30 mEq Oral Once  . pregabalin  150 mg Oral TID  . Selexipag  1,600 mcg Oral BID  . sodium chloride  3 mL Intravenous Q12H   Continuous Infusions:  PRN Meds:.albuterol, HYDROcodone-acetaminophen Assessment/Plan:  Acute GI Bleed: Disproportionately high BUN to 116 on admission suggestive of upper GI/small bowel bleed. No source or AVMs identified on EGD, although he was noted to have AVMs that were ablated in 03/2014 in the duodenum. Known AVMs from colonoscopy in 2015. It appears her bleeding episodes have abated. Nonetheless, her hemoglobin has been resistant to blood transfusions. She has received a total of 4U of blood in the last 24 hours and her Hgb has only increased from 5.5 to 7, suggesting either a persistent bleed or other process (See "microcytic anemia" below). GI has followed up post-EGD on 12/10, and recommended outpatient colonoscopy in January 2017 with follow-up with Dr. Lindi Adie for iron infusions. - Transfuse 1U blood, follow-up CBC - Re-consult GI if hematochezia returns or if she develops melena - Advance to Heart Healthy Diet - Stop protonix  Microcytic Anemia: She has long-standing iron deficiency anemia, receiving IV iron therapy from Dr. Lindi Adie. However, on admission she was noted to have large Hgb dipstick, but only 0-5 RBCs in urine. This could be consistent with a hemolytic anemia and may explain why her anemia has been resistant to transfusions. Past Hgb since 04/2014 have  ranged from 8-10. - Haptoglobin, LDH, reticulocytes, blood smear, urine hemosiderin pending  Hypotension: High 70s/40s overnight, apparently asymptomatic, although she reports not getting up. Previous clinic notes show 90s/50s and asymptomatic. Given hypotension, will hold off on restarting PAH medications at this point, other than Selexipag.  Severe Pulmonary Arterial Hypertension with Cor Pulmonale: Followed by HF clinic, most recently seen on 11/22. Home regimen includestorsemide 60 mg BID, metolazone weekly,  ambrisentan 10 mg, selexapag 1600 mcg bid, adcirca 40 mg daily. Weight at previous clinic visit was 147, which was considered volume overloaded. 138 lbs on admission, appearing euvolemia. - Followed by [facility] in hospital, greatly appreciate recommendations - Restarted Selexipag, which is absolutely essential that she continue to be since rebound PAH can be severe.  Scleroderma: Follows with Dr. Amil Amen per chart review. Not currently on steroids or medications for this. No signs on exam. -Outpatient follow up  Hypothyroidism: TSH 3.3 in hospital.  - Restarted 25 mcg Synthroid daily  Peripheral Neuropathy: Reordered home pregabalin. Holding amitriptyline given hypotension.  Paroxysmal Atrial Fibrillation: Restarted home amiodarone  DVT Prophylaxis: SCDs  Dispo: Disposition is deferred at this time, awaiting improvement of current medical problems.  Anticipated discharge  in approximately 2-3 day(s).   The patient does have a current PCP Sid Falcon, MD) and does need an [facility] hospital follow-up appointment after discharge.  The patient does have transportation limitations that hinder transportation to clinic appointments.  .Services Needed at time of discharge: Y = Yes, Blank = No PT:   OT:   RN:   Equipment:   Other:     LOS: 2 days   Liberty Handy, MD 02/22/2015, 10:09 AM

## 2015-02-22 NOTE — Progress Notes (Signed)
  Date: 02/22/2015  Patient name: [name]  Medical record number: [id number]  Date of birth: 1950-04-29   I have seen and evaluated [name] and discussed their care with the Residency Team. [name] os a 64 yo with PAH and cor pulmonale, scleroderma, P A Fib (no AC 2/2 prior GI bleeds), and h/o GI bleeds 2/2 duo AVM and colon AVM. She was admitted to the hospitalist service and then transferred to IM teaching service as she is our pt. She had BRBPR on the day prior to admission. She was found to have a HgB of 5.5. She has been transfused a total of 4 units PRBC and HgB only up to 7.1. She is receiving one more unit.   Her vol status is tenuous 2/2 pul HTN. Her SBP has been in the 70's - 90's. She was placed on 100 cc/hr and has received a total of 3 L of IVF and PRBC. She is sating well on RA. Trace LE edema  She c/o ABD pain in the ED and noted to have large HgB on UA. However, she only had 0-5 RBC's on microscopy. She had CT renal stone study which was negative.  This AM, she is c/o food. Not dizzy or lightheaded and no dyspnea or CP.  PMHx, Fam Hx, and/or Soc Hx : On medicare  Filed Vitals:   02/22/15 1116 02/22/15 1118  BP: 97/50 91/53  Pulse: 55 58  Temp:    Resp: 15 15   Afebrile Hbrady no systolic murmur R sternal border ABD + BS Ext trace edema B Neuro Mentating  Cr now 1.86, down from 2.21. Baseline about 1.6 ish  Assessment and Plan: I have seen and evaluated the patient as outlined above. I agree with the formulated Assessment and Plan as detailed in the residents' admission note, with the following changes:   1. Acute GI bleed - her EGD did not show the source of bleeding and GI has signed off and rec outpt appt in 2017 for consideration of colonoscopy.   2. Acute on chronic microcytic anemia - The acute component is likely GI blood loss as gastric AVM's are quite common in systemic scleroderma and she has known duo AVM. However, a source of bleeding was not  found on EGD and she has large blood on UA but few RBC on microscopy which suggests hemolysis. W/U will be started but will need to be interpreted in light of PRBC transfusion.  3. Hypotension - likely 2/2 blood loss which is being repleted. Very tenuous vol status. She is mentating and perfusing all organs (cr trending down) and getting another unit today so will stop IVF and follow vol status closely. HF team also follow and plans to diurese once BP can tolerate it.   Bartholomew Crews, MD 12/10/201611:19 AM

## 2015-02-23 ENCOUNTER — Encounter (HOSPITAL_COMMUNITY): Payer: Self-pay | Admitting: Certified Registered"

## 2015-02-23 DIAGNOSIS — D649 Anemia, unspecified: Secondary | ICD-10-CM

## 2015-02-23 DIAGNOSIS — I5033 Acute on chronic diastolic (congestive) heart failure: Secondary | ICD-10-CM

## 2015-02-23 DIAGNOSIS — N183 Chronic kidney disease, stage 3 (moderate): Secondary | ICD-10-CM

## 2015-02-23 DIAGNOSIS — K5521 Angiodysplasia of colon with hemorrhage: Secondary | ICD-10-CM | POA: Diagnosis present

## 2015-02-23 LAB — TYPE AND SCREEN
ABO/RH(D): O NEG
Antibody Screen: POSITIVE
DAT, IGG: NEGATIVE
DONOR AG TYPE: NEGATIVE
DONOR AG TYPE: NEGATIVE
DONOR AG TYPE: NEGATIVE
Donor AG Type: NEGATIVE
Donor AG Type: NEGATIVE
UNIT DIVISION: 0
UNIT DIVISION: 0
Unit division: 0
Unit division: 0
Unit division: 0

## 2015-02-23 LAB — URINALYSIS, ROUTINE W REFLEX MICROSCOPIC
Bilirubin Urine: NEGATIVE
GLUCOSE, UA: NEGATIVE mg/dL
KETONES UR: NEGATIVE mg/dL
Nitrite: NEGATIVE
Protein, ur: NEGATIVE mg/dL
Specific Gravity, Urine: 1.014 (ref 1.005–1.030)
pH: 6 (ref 5.0–8.0)

## 2015-02-23 LAB — CBC
HCT: 24.9 % — ABNORMAL LOW (ref 36.0–46.0)
Hemoglobin: 8.2 g/dL — ABNORMAL LOW (ref 12.0–15.0)
MCH: 28.2 pg (ref 26.0–34.0)
MCHC: 32.9 g/dL (ref 30.0–36.0)
MCV: 85.6 fL (ref 78.0–100.0)
Platelets: 114 10*3/uL — ABNORMAL LOW (ref 150–400)
RBC: 2.91 MIL/uL — ABNORMAL LOW (ref 3.87–5.11)
RDW: 19.9 % — ABNORMAL HIGH (ref 11.5–15.5)
WBC: 5.6 10*3/uL (ref 4.0–10.5)

## 2015-02-23 LAB — BASIC METABOLIC PANEL
ANION GAP: 7 (ref 5–15)
BUN: 62 mg/dL — AB (ref 6–20)
CALCIUM: 8.9 mg/dL (ref 8.9–10.3)
CO2: 24 mmol/L (ref 22–32)
Chloride: 111 mmol/L (ref 101–111)
Creatinine, Ser: 1.48 mg/dL — ABNORMAL HIGH (ref 0.44–1.00)
GFR calc Af Amer: 42 mL/min — ABNORMAL LOW (ref >60)
GFR calc non Af Amer: 37 mL/min — ABNORMAL LOW (ref >60)
GLUCOSE: 85 mg/dL (ref 65–99)
Potassium: 3.7 mmol/L (ref 3.5–5.1)
Sodium: 142 mmol/L (ref 135–145)

## 2015-02-23 LAB — HAPTOGLOBIN: Haptoglobin: 71 mg/dL (ref 34–200)

## 2015-02-23 LAB — URINE MICROSCOPIC-ADD ON

## 2015-02-23 MED ORDER — TORSEMIDE 20 MG PO TABS
60.0000 mg | ORAL_TABLET | Freq: Two times a day (BID) | ORAL | Status: DC
  Administered 2015-02-23 (×2): 60 mg via ORAL
  Filled 2015-02-23 (×2): qty 3

## 2015-02-23 MED ORDER — POTASSIUM CHLORIDE CRYS ER 20 MEQ PO TBCR
40.0000 meq | EXTENDED_RELEASE_TABLET | Freq: Every day | ORAL | Status: DC
  Administered 2015-02-23 – 2015-02-24 (×2): 40 meq via ORAL
  Filled 2015-02-23 (×2): qty 2

## 2015-02-23 MED ORDER — PEG-KCL-NACL-NASULF-NA ASC-C 100 G PO SOLR: 1.0000 | Freq: Once | ORAL | Status: DC

## 2015-02-23 MED ORDER — PEG-KCL-NACL-NASULF-NA ASC-C 100 G PO SOLR
0.5000 | Freq: Once | ORAL | Status: AC
  Administered 2015-02-23: 100 g via ORAL

## 2015-02-23 MED ORDER — TADALAFIL (PAH) 20 MG PO TABS
20.0000 mg | ORAL_TABLET | Freq: Every day | ORAL | Status: DC
  Administered 2015-02-23: 20 mg via ORAL
  Filled 2015-02-23 (×2): qty 1

## 2015-02-23 MED ORDER — PEG-KCL-NACL-NASULF-NA ASC-C 100 G PO SOLR
0.5000 | Freq: Once | ORAL | Status: AC
  Administered 2015-02-23: 100 g via ORAL
  Filled 2015-02-23: qty 1

## 2015-02-23 NOTE — Progress Notes (Signed)
Subjective:  Patient was seen and examined this morning. She admits to left sided abdominal pain with multiple bright red bowel movements yesterday (3-4). She is unsure is she is lightheaded as she has not gotten up from bed today. She denies any nausea, vomiting, chest pain or shortness of breath.   Objective: Vital signs in last 24 hours: Filed Vitals:   02/23/15 0400 02/23/15 0500 02/23/15 0600 02/23/15 0700  BP: 81/37 85/44 88/40 91/47  Pulse: 57 57 58 59  Temp:      TempSrc:      Resp: _0 Height:      Weight:      SpO2: 93% 97% 92% 99%   Weight change: 4 lb 10.1 oz (2.1 kg)  Intake/Output Summary (Last 24 hours) at 02/23/15 0823 Last data filed at 02/23/15 0200  Gross per 24 hour  Intake   1086 ml  Output    400 ml  Net    686 ml   General: Vital signs reviewed.  Patient is in no acute distress and cooperative with exam.  Cardiovascular: Bradycardic, regular rhythm. Pulmonary/Chest: Mild inspiratory crackles in LLL field, no wheezes, or rhonchi. Abdominal: Soft, non-tender, non-distended, BS +, no guarding present.  Extremities: No lower extremity edema bilaterally Skin: Warm, dry and intact. Psychiatric: Normal mood and affect. Cognition and memory are normal.   Lab Results: Basic Metabolic Panel:  Recent Labs Lab 02/22/15 0436 02/23/15 0622  NA 143 142  K 3.3* 3.7  CL 112* 111  CO2 24 24  GLUCOSE 87 85  BUN 79* 62*  CREATININE 1.86* 1.48*  CALCIUM 8.7* 8.9   Liver Function Tests:  Recent Labs Lab 02/20/15 2111 02/22/15 1000  AST 22  --   ALT 11*  --   ALKPHOS 78  --   BILITOT 0.3 0.7  PROT 6.5  --   ALBUMIN 3.1*  --     Recent Labs Lab 02/20/15 2111  LIPASE 29   CBC:  Recent Labs Lab 02/22/15 2150 02/23/15 0622  WBC 7.0 5.6  HGB 8.3* 8.2*  HCT 25.0* 24.9*  MCV 84.7 85.6  PLT 116* 114*   Thyroid Function Tests:  Recent Labs Lab 02/21/15 1201  TSH 3.314   Coagulation:  Recent Labs Lab 02/22/15 1705    LABPROT 16.5*  INR 1.32   Anemia Panel:  Recent Labs Lab 02/22/15 1000  RETICCTPCT 4.9*   Urine Drug Screen: Drugs of Abuse     Component Value Date/Time   LABOPIA POSITIVE* 10/02/2010 1128   COCAINSCRNUR NONE DETECTED 10/02/2010 1128   LABBENZ NONE DETECTED 10/02/2010 1128   AMPHETMU NONE DETECTED 10/02/2010 1128   THCU NONE DETECTED 10/02/2010 1128   LABBARB NONE DETECTED 10/02/2010 1128    Urinalysis:  Recent Labs Lab 02/20/15 2145  COLORURINE YELLOW  LABSPEC 1.007  PHURINE 7.0  GLUCOSEU NEGATIVE  HGBUR LARGE*  BILIRUBINUR NEGATIVE  KETONESUR NEGATIVE  PROTEINUR NEGATIVE  NITRITE NEGATIVE  LEUKOCYTESUR TRACE*   Micro Results: Recent Results (from the past 240 hour(s))  MRSA PCR Screening     Status: None   Collection Time: 02/21/15 12:28 PM  Result Value Ref Range Status   MRSA by PCR NEGATIVE NEGATIVE Final    Comment:        The GeneXpert MRSA Assay (FDA approved for NASAL specimens only), is one component of a comprehensive MRSA colonization surveillance program. It is not intended to diagnose MRSA infection nor to guide or monitor treatment for MRSA  infections.    Medications:  I have reviewed the patient's current medications. Prior to Admission:  Prescriptions prior to admission  Medication Sig Dispense Refill Last Dose  . ADCIRCA 20 MG TABS TAKE 2 TABLETS (40MG) BY MOUTH ONCE DAILY WITH OR WITHOUT FOOD. CALL [phone] FOR REFILLS. 60 tablet 11 02/20/2015 at Unknown time  . ambrisentan (LETAIRIS) 10 MG tablet Take 1 tablet (10 mg total) by mouth daily. 30 tablet 11 02/20/2015 at Unknown time  . amiodarone (PACERONE) 200 MG tablet Take 1 tablet (200 mg total) by mouth daily. 30 tablet 6 02/20/2015 at Unknown time  . esomeprazole (NEXIUM) 40 MG capsule Take 40 mg by mouth 2 (two) times daily.    02/20/2015 at Unknown time  . fenofibrate 54 MG tablet Take 1 tablet (54 mg total) by mouth daily. 30 tablet 12 02/20/2015 at Unknown time  .  FLUoxetine (PROZAC) 20 MG capsule take 1 capsule by mouth every morning 30 capsule 2 02/20/2015 at Unknown time  . HYDROcodone-acetaminophen (NORCO) 10-325 MG per tablet Take 1 tablet by mouth every 6 (six) hours as needed for moderate pain.    PRN  . levothyroxine (SYNTHROID, LEVOTHROID) 25 MCG tablet Take 1 tablet (25 mcg total) by mouth daily before breakfast. 30 tablet 11 02/20/2015 at Unknown time  . magnesium oxide (MAG-OX) 400 MG tablet Take 400 mg by mouth 2 (two) times daily.   02/20/2015 at Unknown time  . metolazone (ZAROXOLYN) 2.5 MG tablet Take 1 tablet (2.5 mg total) by mouth once a week. On Fridays 10 tablet 3 02/14/2015  . nortriptyline (PAMELOR) 10 MG capsule Take 20 mg by mouth at bedtime.   02/19/2015 at Unknown time  . potassium chloride SA (K-DUR,KLOR-CON) 20 MEQ tablet Take 20 mEq by mouth daily.   02/20/2015 at Unknown time  . pregabalin (LYRICA) 150 MG capsule Take 1 capsule (150 mg total) by mouth 3 (three) times daily. 90 capsule 5 02/20/2015 at Unknown time  . Selexipag (UPTRAVI) 1600 MCG TABS Take 1,600 mcg by mouth 2 (two) times daily. 60 tablet 3 02/20/2015 at Unknown time  . torsemide (DEMADEX) 20 MG tablet Take 3 tablets (60 mg total) by mouth 2 (two) times daily. 180 tablet 3 02/20/2015 at Unknown time   Scheduled Meds: . ambrisentan  10 mg Oral Daily  . amiodarone  200 mg Oral Daily  . levothyroxine  25 mcg Oral QAC breakfast  . potassium chloride SA  20 mEq Oral Daily  . pregabalin  150 mg Oral TID  . Selexipag  1,600 mcg Oral BID  . sodium chloride  3 mL Intravenous Q12H   Continuous Infusions:  PRN Meds:.albuterol, HYDROcodone-acetaminophen Assessment/Plan: Active Problems:   DM type 2 with diabetic peripheral neuropathy (HCC)   Essential hypertension   SCLERODERMA   CKD (chronic kidney disease), stage III   Acute lower GI bleeding   GI bleed   Symptomatic anemia  Acute GI Bleed: Patient continues to have episodes of hematochezia. Her hemoglobin has  remained stable at 8.2-8.3 since yesterday afternoon. She has received a total of 5 pRBCs since admission after presenting with Hgb of 5.5. No source or AVMs identified on EGD, although he was noted to have AVMs that were ablated in 03/2014 in the duodenum. Known AVMs from colonoscopy in 2015. There is concern that patient may be hemolyzing as her hemoglobin levels were not responding appropriately to the transfusions, but they seem to have stabilized now. T.bili, LDH normal. Retic count elevated at 4.9. Smear showed  polychromasia, but no schistocytes. Urine hemosiderin and haptoglobin are pending. BUN has improved from 110s to 60s. Plan is to repeat a colonoscopy once hemoglobin has been stable in the 8s and cardiopulmonary status is optimized. I feel patient could go for colonoscopy today given her stability.  -GI following, appreciate recommendations -Plan for colonoscopy -Repeat CBC tomorrow am -Transfuse for Hgb <8 -Clear liquid diet  Chronic Microcytic Anemia: Patient has long-standing iron deficiency anemia, receiving IV iron therapy from Dr. Lindi Adie. However, on admission she was noted to have large Hgb dipstick, but only 0-5 RBCs in urine. There was concern for hemolytic anemia as the patient's anemia had been resistant to transfusions; however, her hemoglobin has been stable at 8. T.bili, LDH normal. Retic count elevated at 4.9. Smear showed polychromasia, but no schistocytes. Urine hemosiderin and haptoglobin are pending. -Urine hemosiderin and haptoglobin pending for hemolysis work up -Repeat CBC tomorrow am  Hypotension: SBP is the 80-90s this morning which is about at her baseline.  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 Home regimen includestorsemide 60 mg BID, metolazone weekly, ambrisentan 10 mg, selexapag 1600 mcg bid, adcirca 40 mg daily. Weight has trended 137>138>141 this morning. She  denies shortness of breath. Crackles auscultated on exam in LLL consistent with exam findings from yesterday. No LEE b/l.  -Cardiology following, appreciate recommendations -Selexipag 1600 mcg QD -Ambrisentan 10 mg daily -Holding Adcirca for now -Holding metolazone and torsemide for now, watching fluid status, consider restarting given weight gain, although asymptomatic  Chronic Kidney Disease: Baseline creatinine appears to be around 1.7. Creatinine this morning 1.48.  -Continue to monitor  Scleroderma: Follows with Dr. Amil Amen per chart review. Not currently on steroids or medications for this. No signs on exam. -Outpatient follow up  Hypothyroidism: TSH 3.3 in hospital.  -Continue 25 mcg Synthroid daily  Peripheral Neuropathy: Reordered home pregabalin. Holding amitriptyline given hypotension. -Continue pregabalin 150 mg TID  Paroxysmal Atrial Fibrillation: Patient is on amiodarone 200 mg daily at home. -Continue amiodarone 200 mg daily  DVT/PE ppx: SCDs FEN: Clear liquids Code: FULL  Dispo: Disposition is deferred at this time, awaiting improvement of current medical problems.  Anticipated discharge in approximately 2-3 day(s).   The patient does have a current PCP Sid Falcon, MD) and does need an [facility] hospital follow-up appointment after discharge.  The patient does not have transportation limitations that hinder transportation to clinic appointments.   LOS: 3 days   Osa Craver, DO PGY-1 Internal Medicine Resident Pager # [phone] 02/23/2015 8:23 AM

## 2015-02-23 NOTE — Progress Notes (Signed)
    Progress Note   Subjective  Several episodes of hematochezia yesterday. No GI bleeding so far today.   Objective  Vital signs in last 24 hours: Temp:  [97.5 F (36.4 C)-98.5 F (36.9 C)] 97.7 F (36.5 C) (12/11 0837) Pulse Rate:  [41-153] 57 (12/11 0900) Resp:  [10-20] 10 (12/11 0900) BP: (76-98)/(36-66) 76/46 mmHg (12/11 0900) SpO2:  [47 %-100 %] 100 % (12/11 0900) Weight:  [141 lb 12.1 oz (64.3 kg)] 141 lb 12.1 oz (64.3 kg) (12/11 0300) Last BM Date: 02/22/15  General: Alert, well-developed, in NAD Heart:  Regular rate and rhythm; no murmurs Chest: Clear to ascultation bilaterally Abdomen:  Soft, nontender and nondistended. Normal bowel sounds, without guarding, and without rebound.   Extremities:  Without edema. Neurologic:  Alert and  oriented x4; grossly normal neurologically. Psych:  Alert and cooperative. Normal mood and affect.  Intake/Output from previous day: 12/10 0701 - 12/11 0700 In: 1336 [P.O.:1025; I.V.:3; Blood:308] Out: 400 [Urine:400] Intake/Output this shift: Total I/O In: -  Out: 250 [Urine:250]  Lab Results:  Recent Labs  02/22/15 1355 02/22/15 2150 02/23/15 0622  WBC 6.2 7.0 5.6  HGB 8.2* 8.3* 8.2*  HCT 25.0* 25.0* 24.9*  PLT 102* 116* 114*   BMET  Recent Labs  02/21/15 1201 02/22/15 0436 02/23/15 0622  NA 145 143 142  K 3.5 3.3* 3.7  CL 110 112* 111  CO2 _0 GLUCOSE 97 87 85  BUN 97* 79* 62*  CREATININE 2.10* 1.86* 1.48*  CALCIUM 8.9 8.7* 8.9   LFT  Recent Labs  02/20/15 2111 02/22/15 1000  PROT 6.5  --   ALBUMIN 3.1*  --   AST 22  --   ALT 11*  --   ALKPHOS 78  --   BILITOT 0.3 0.7   PT/INR  Recent Labs  02/22/15 1705  LABPROT 16.5*  INR 1.32      Assessment & Plan   1. Hematochezia likely secondary to known colonic AVMs. Anticipate APC of colonic AVMs. Hb now 8.2 and systolic BPs generally in the low 80s to low 90s. She is at higher risk for cardiopulmonary complications due to cor pulmonale,  severe PAH. Bowel prep today and colonoscopy with MAC tomorrow. Pt agrees to proceed. The risks (including bleeding, perforation, infection, missed lesions, medication reactions and possible hospitalization or surgery if complications occur), benefits, and alternatives to colonoscopy with possible biopsy and possible polypectomy were discussed with the patient and they consent to proceed. Hold torsemide for colonoscopy as per Dr. Aundra Dubin.   Active Problems:   DM type 2 with diabetic peripheral neuropathy (HCC)   Essential hypertension   SCLERODERMA   CKD (chronic kidney disease), stage III   Acute lower GI bleeding   GI bleed   Symptomatic anemia    LOS: 3 days   Norberto Sorenson T. Fuller Plan MD 02/23/2015, 10:18 AM [phone] 8a-5p weekdays [phone] after 5p, weekends, holidays

## 2015-02-23 NOTE — Progress Notes (Signed)
Patient ID: [name], female   DOB: 07-27-1950, 64 y.o.   MRN: [id number]    SUBJECTIVE:  [name] is a 64 year old with a history of DM2, HLD, HTN, pHTN, GERD, gastroparesis, scleroderma, DJD, cor pulmonale, severe PAH, hypothyroidism, and anemia (history of duodenal and colonic AVMs) admitted with GI bleed.   She is followed closely in the HF clinic and was last seen November 22nd. She was volume overloaded. She continued on torsemide 60 mg twice a day with metolazone added weekly. Pulmonary HTN regimen included: ambrisentan 10 mg, selexapag 1600 mcg bid, adcirca 40 mg daily. Weight at that visit was 147 pounds.  12/8 she had melena and presented to [facility] ED. Hgb on admit was 5.5. Received 3U PRBCs.  On 12/9, had EGD without any significant findings.  She has had an additional 2 units PRBCs.  SBP in 80s-90s, this is her baseline.  She had 3 additional episodes of hematochezia yesterday, none today so far.  Scheduled Meds: . ambrisentan  10 mg Oral Daily  . amiodarone  200 mg Oral Daily  . levothyroxine  25 mcg Oral QAC breakfast  . potassium chloride SA  20 mEq Oral Daily  . pregabalin  150 mg Oral TID  . Selexipag  1,600 mcg Oral BID  . sodium chloride  3 mL Intravenous Q12H   Continuous Infusions:  PRN Meds:.albuterol, HYDROcodone-acetaminophen   Filed Vitals:   02/23/15 0500 02/23/15 0600 02/23/15 0700 02/23/15 0837  BP: 85/44 88/40 91/47 88/47  Pulse: 57 58 59 58  Temp:    97.7 F (36.5 C)  TempSrc:    Oral  Resp: _0 Height:      Weight:      SpO2: 97% 92% 99% 100%    Intake/Output Summary (Last 24 hours) at 02/23/15 0956 Last data filed at 02/23/15 0800  Gross per 24 hour  Intake   1086 ml  Output    650 ml  Net    436 ml    LABS: Basic Metabolic Panel:  Recent Labs  02/22/15 0436 02/23/15 0622  NA 143 142  K 3.3* 3.7  CL 112* 111  CO2 24 24  GLUCOSE 87 85  BUN 79* 62*  CREATININE 1.86* 1.48*  CALCIUM 8.7* 8.9   Liver Function  Tests:  Recent Labs  02/20/15 2111 02/22/15 1000  AST 22  --   ALT 11*  --   ALKPHOS 78  --   BILITOT 0.3 0.7  PROT 6.5  --   ALBUMIN 3.1*  --     Recent Labs  02/20/15 2111  LIPASE 29   CBC:  Recent Labs  02/22/15 2150 02/23/15 0622  WBC 7.0 5.6  HGB 8.3* 8.2*  HCT 25.0* 24.9*  MCV 84.7 85.6  PLT 116* 114*   Cardiac Enzymes: No results for input(s): CKTOTAL, CKMB, CKMBINDEX, TROPONINI in the last 72 hours. BNP: Invalid input(s): POCBNP D-Dimer: No results for input(s): DDIMER in the last 72 hours. Hemoglobin A1C: No results for input(s): HGBA1C in the last 72 hours. Fasting Lipid Panel: No results for input(s): CHOL, HDL, LDLCALC, TRIG, CHOLHDL, LDLDIRECT in the last 72 hours. Thyroid Function Tests:  Recent Labs  02/21/15 1201  TSH 3.314   Anemia Panel:  Recent Labs  02/22/15 1000  RETICCTPCT 4.9*    RADIOLOGY: Dg Chest Port 1 View  02/20/2015  CLINICAL DATA:  Acute onset of generalized chest pain and anemia. Generalized weakness. Initial encounter. EXAM: PORTABLE CHEST  1 VIEW COMPARISON:  Chest radiograph performed 12/24/2014 FINDINGS: The lungs are well-aerated. Mild right basilar and left midlung airspace opacities raise concern for mild pneumonia. There is no evidence of pleural effusion or pneumothorax. The cardiomediastinal silhouette is borderline enlarged. No acute osseous abnormalities are seen. IMPRESSION: Mild right basilar and left midlung airspace opacities raise concern for mild pneumonia. Borderline cardiomegaly. Electronically Signed   By: Garald Balding M.D.   On: 02/20/2015 22:25   Ct Renal Stone Study  02/20/2015  CLINICAL DATA:  Generalized abdominal pain, swelling, diarrhea. Bright red blood per rectum beginning yesterday. Severe anemia. EXAM: CT ABDOMEN AND PELVIS WITHOUT CONTRAST TECHNIQUE: Multidetector CT imaging of the abdomen and pelvis was performed following the standard protocol without IV contrast. COMPARISON:  03/11/2014  FINDINGS: Lower chest: No acute findings. Previously seen pericardial effusion is resolved. Hepatobiliary: Probable small hepatic cysts appears stable. No definite liver masses visualized on this unenhanced exam. Gallbladder is unremarkable. Pancreas: No mass or inflammatory process identified on this un-enhanced exam. Spleen: Within normal limits in size. Adrenals/Urinary Tract: No evidence of urolithiasis or hydronephrosis. No definite mass visualized on this un-enhanced exam. Stomach/Bowel: No evidence of obstruction, inflammatory process, or abnormal fluid collections. Large colonic stool burden noted. Vascular/Lymphatic: No pathologically enlarged lymph nodes. 9 mm left common iliac lymph node is decreased in size from 15 mm on previous study. No evidence of abdominal aortic aneurysm. Reproductive: Prior hysterectomy noted. Adnexal regions are unremarkable in appearance. Other: None. Musculoskeletal:  No suspicious bone lesions identified. IMPRESSION: No acute findings identified within the abdomen or pelvis. Large stool burden noted; suggest clinical correlation for possible constipation. Electronically Signed   By: Earle Gell M.D.   On: 02/20/2015 23:44    PHYSICAL EXAM General: NAD Neck: JVP 10-12 cm, no thyromegaly or thyroid nodule.  Lungs: Clear to auscultation bilaterally with normal respiratory effort. CV: Nondisplaced PMI.  Heart regular S1/S2, no S3/S4, 2/6 HSM LLSB. 1+ ankle edema.   Abdomen: Soft, nontender, no hepatosplenomegaly, no distention.  Neurologic: Alert and oriented x 3.  Psych: Normal affect. Extremities: No clubbing or cyanosis.   TELEMETRY: Reviewed telemetry pt in NSR     Assessment:  1. 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 AVMs on colonoscopy 09/2013. 02/21/15 EGD: normal. Last episode hematochezia 12/10 evening. 2. 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 CKD III. Creatinine baseline 1.8-2.1  4.  Hypothyroidism  5. PAF: has not been anticoagulated due to GI bleeding. Now in NSR.  6. H/o Pericardial Effusion.    Plan/Discussion:   [name] is a 64 year old with cor pulmonale due to severe PAH admitted with GI bleed. EGD did not show any abnormalities.  She had more hematochezia last night.  Hemoglobin is stable this morning (compared to yesterday).  Given history of colonic AVMs and hematochezia, think she should have colonoscopy this admission.  Unsure on timing now, waiting to hear from GI.   At home, she is on ambrisentan 10 mg daily, selexipag 1600 mcg twice a day, and adicira 40 mg daily for pulmonary hypertension.  She was started back on selexipag and ambrisentan.  SBP in 80s-90s, which is normal range for her (runs 80s-90s as outpatient).   - Can restart Adcirca at 20 mg daily today.    Prior to admit she was on torsemide 60 mg twice a day + metolazone 2.5 mg once a week.  Weight is up another 3 lbs.  Not  short of breath. Creatinine below her baseline. - Restart torsemide 60 mg bid. If she has colonoscopy in am, would hold am torsemide.   [name] 02/23/2015 9:56 AM

## 2015-02-24 ENCOUNTER — Encounter (HOSPITAL_COMMUNITY): Payer: Self-pay | Admitting: Gastroenterology

## 2015-02-24 ENCOUNTER — Encounter (HOSPITAL_COMMUNITY)
Admission: EM | Disposition: A | Discharge status: Home Health | Payer: Self-pay | Source: Home / Self Care | Attending: Internal Medicine

## 2015-02-24 DIAGNOSIS — G629 Polyneuropathy, unspecified: Secondary | ICD-10-CM

## 2015-02-24 DIAGNOSIS — E039 Hypothyroidism, unspecified: Secondary | ICD-10-CM

## 2015-02-24 DIAGNOSIS — E7522 Gaucher disease: Secondary | ICD-10-CM

## 2015-02-24 DIAGNOSIS — N189 Chronic kidney disease, unspecified: Secondary | ICD-10-CM

## 2015-02-24 DIAGNOSIS — I480 Paroxysmal atrial fibrillation: Secondary | ICD-10-CM

## 2015-02-24 DIAGNOSIS — D509 Iron deficiency anemia, unspecified: Secondary | ICD-10-CM

## 2015-02-24 HISTORY — PX: COLONOSCOPY: SHX5424

## 2015-02-24 LAB — CBC
HEMATOCRIT: 30.9 % — AB (ref 36.0–46.0)
HEMOGLOBIN: 9.8 g/dL — AB (ref 12.0–15.0)
MCH: 27.7 pg (ref 26.0–34.0)
MCHC: 31.7 g/dL (ref 30.0–36.0)
MCV: 87.3 fL (ref 78.0–100.0)
Platelets: 107 10*3/uL — ABNORMAL LOW (ref 150–400)
RBC: 3.54 MIL/uL — ABNORMAL LOW (ref 3.87–5.11)
RDW: 20.1 % — AB (ref 11.5–15.5)
WBC: 4.8 10*3/uL (ref 4.0–10.5)

## 2015-02-24 LAB — BASIC METABOLIC PANEL
Anion gap: 9 (ref 5–15)
BUN: 46 mg/dL — ABNORMAL HIGH (ref 6–20)
CALCIUM: 9 mg/dL (ref 8.9–10.3)
CHLORIDE: 108 mmol/L (ref 101–111)
CO2: 23 mmol/L (ref 22–32)
CREATININE: 1.37 mg/dL — AB (ref 0.44–1.00)
GFR calc non Af Amer: 40 mL/min — ABNORMAL LOW (ref >60)
GFR, EST AFRICAN AMERICAN: 46 mL/min — AB (ref >60)
GLUCOSE: 87 mg/dL (ref 65–99)
Potassium: 4.2 mmol/L (ref 3.5–5.1)
Sodium: 140 mmol/L (ref 135–145)

## 2015-02-24 SURGERY — COLONOSCOPY
Anesthesia: Moderate Sedation

## 2015-02-24 MED ORDER — DIPHENHYDRAMINE HCL 50 MG/ML IJ SOLN
INTRAMUSCULAR | Status: DC | PRN
  Administered 2015-02-24: 25 mg via INTRAVENOUS

## 2015-02-24 MED ORDER — TADALAFIL (PAH) 20 MG PO TABS
40.0000 mg | ORAL_TABLET | Freq: Every day | ORAL | Status: DC
  Administered 2015-02-24 – 2015-02-26 (×3): 40 mg via ORAL
  Filled 2015-02-24 (×3): qty 2

## 2015-02-24 MED ORDER — FENTANYL CITRATE (PF) 100 MCG/2ML IJ SOLN
INTRAMUSCULAR | Status: DC | PRN
  Administered 2015-02-24: 12.5 ug via INTRAVENOUS

## 2015-02-24 MED ORDER — DIPHENHYDRAMINE HCL 50 MG/ML IJ SOLN
INTRAMUSCULAR | Status: AC
  Filled 2015-02-24: qty 1

## 2015-02-24 MED ORDER — TORSEMIDE 20 MG PO TABS
60.0000 mg | ORAL_TABLET | Freq: Two times a day (BID) | ORAL | Status: DC
  Administered 2015-02-24: 60 mg via ORAL
  Filled 2015-02-24 (×2): qty 3

## 2015-02-24 MED ORDER — SODIUM CHLORIDE 0.9 % IV SOLN
INTRAVENOUS | Status: DC
  Administered 2015-02-24: 500 mL via INTRAVENOUS

## 2015-02-24 MED ORDER — MIDAZOLAM HCL 5 MG/5ML IJ SOLN
INTRAMUSCULAR | Status: DC | PRN
  Administered 2015-02-24: 2 mg via INTRAVENOUS

## 2015-02-24 MED ORDER — FENTANYL CITRATE (PF) 100 MCG/2ML IJ SOLN
INTRAMUSCULAR | Status: AC
Start: 2015-02-24 — End: 2015-02-24
  Filled 2015-02-24: qty 2

## 2015-02-24 MED ORDER — MIDAZOLAM HCL 5 MG/ML IJ SOLN
INTRAMUSCULAR | Status: AC
  Filled 2015-02-24: qty 2

## 2015-02-24 NOTE — Progress Notes (Signed)
Internal Medicine Attending  Date: 02/24/2015  Patient name: [name] Medical record number: [id number] Date of birth: Oct 08, 1950 Age: 64 y.o. Gender: female  I saw and evaluated the patient. I reviewed the resident's note by Dr. Barbera Setters and I agree with the resident's findings and plans as documented in his progress note.  [name] denied any hematochezia overnight. She underwent a colonoscopy this morning and had 10-15 left-sided AVMs ablated with likely AVMs throughout the colon. We will monitor her overnight to assure she's had no further hematochezia or drop in her hemoglobin. We appreciate Dr. Celesta Aver recommendations to maintain her iron status with the understanding that she will likely continue to have slow chronic blood loss from her colonic AVMs.

## 2015-02-24 NOTE — Progress Notes (Signed)
Patient ID: CHERYAL SALAS, female   DOB: August 31, 1950, 64 y.o.   MRN: [id number]    SUBJECTIVE:  Ms Perfetti is a 64 year old with a history of DM2, HLD, HTN, pHTN, GERD, gastroparesis, scleroderma, DJD, cor pulmonale, severe PAH, hypothyroidism, and anemia (history of duodenal and colonic AVMs) admitted with GI bleed.   She is followed closely in the HF clinic and was last seen November 22nd. She was volume overloaded. She continued on torsemide 60 mg twice a day with metolazone added weekly. Pulmonary HTN regimen included: ambrisentan 10 mg, selexapag 1600 mcg bid, adcirca 40 mg daily. Weight at that visit was 147 pounds.  12/8 she had melena and presented to [facility] ED. Hgb on admit was 5.5. Received 3U PRBCs.  On 12/9, had EGD without any significant findings.  She has had an additional 2 units PRBCs.  SBP in high 70s-90s, this is her baseline.  No further hematochezia yesterday and hemoglobin higher.  Got her home torsemide yesterday and weight is down.   Scheduled Meds: . ambrisentan  10 mg Oral Daily  . amiodarone  200 mg Oral Daily  . levothyroxine  25 mcg Oral QAC breakfast  . potassium chloride  40 mEq Oral Daily  . pregabalin  150 mg Oral TID  . Selexipag  1,600 mcg Oral BID  . sodium chloride  3 mL Intravenous Q12H  . Tadalafil (PAH)  40 mg Oral Daily  . torsemide  60 mg Oral BID   Continuous Infusions:  PRN Meds:.albuterol, HYDROcodone-acetaminophen   Filed Vitals:   02/24/15 0500 02/24/15 0600 02/24/15 0700 02/24/15 0728  BP: 80/48 82/52  81/49  Pulse:      Temp:    97.8 F (36.6 C)  TempSrc:    Oral  Resp: _0 Height:      Weight:      SpO2: 96% 97% 97%     Intake/Output Summary (Last 24 hours) at 02/24/15 0747 Last data filed at 02/24/15 0000  Gross per 24 hour  Intake   1463 ml  Output   3150 ml  Net  -1687 ml    LABS: Basic Metabolic Panel:  Recent Labs  02/23/15 0622 02/24/15 0323  NA 142 140  K 3.7 4.2  CL 111 108  CO2 24 23  GLUCOSE 85 87   BUN 62* 46*  CREATININE 1.48* 1.37*  CALCIUM 8.9 9.0   Liver Function Tests:  Recent Labs  02/22/15 1000  BILITOT 0.7   No results for input(s): LIPASE, AMYLASE in the last 72 hours. CBC:  Recent Labs  02/23/15 0622 02/24/15 0323  WBC 5.6 4.8  HGB 8.2* 9.8*  HCT 24.9* 30.9*  MCV 85.6 87.3  PLT 114* 107*   Cardiac Enzymes: No results for input(s): CKTOTAL, CKMB, CKMBINDEX, TROPONINI in the last 72 hours. BNP: Invalid input(s): POCBNP D-Dimer: No results for input(s): DDIMER in the last 72 hours. Hemoglobin A1C: No results for input(s): HGBA1C in the last 72 hours. Fasting Lipid Panel: No results for input(s): CHOL, HDL, LDLCALC, TRIG, CHOLHDL, LDLDIRECT in the last 72 hours. Thyroid Function Tests:  Recent Labs  02/21/15 1201  TSH 3.314   Anemia Panel:  Recent Labs  02/22/15 1000  RETICCTPCT 4.9*    RADIOLOGY: Dg Chest Port 1 View  02/20/2015  CLINICAL DATA:  Acute onset of generalized chest pain and anemia. Generalized weakness. Initial encounter. EXAM: PORTABLE CHEST 1 VIEW COMPARISON:  Chest radiograph performed 12/24/2014 FINDINGS: The lungs are well-aerated.  Mild right basilar and left midlung airspace opacities raise concern for mild pneumonia. There is no evidence of pleural effusion or pneumothorax. The cardiomediastinal silhouette is borderline enlarged. No acute osseous abnormalities are seen. IMPRESSION: Mild right basilar and left midlung airspace opacities raise concern for mild pneumonia. Borderline cardiomegaly. Electronically Signed   By: Garald Balding M.D.   On: 02/20/2015 22:25   Ct Renal Stone Study  02/20/2015  CLINICAL DATA:  Generalized abdominal pain, swelling, diarrhea. Bright red blood per rectum beginning yesterday. Severe anemia. EXAM: CT ABDOMEN AND PELVIS WITHOUT CONTRAST TECHNIQUE: Multidetector CT imaging of the abdomen and pelvis was performed following the standard protocol without IV contrast. COMPARISON:  03/11/2014 FINDINGS:  Lower chest: No acute findings. Previously seen pericardial effusion is resolved. Hepatobiliary: Probable small hepatic cysts appears stable. No definite liver masses visualized on this unenhanced exam. Gallbladder is unremarkable. Pancreas: No mass or inflammatory process identified on this un-enhanced exam. Spleen: Within normal limits in size. Adrenals/Urinary Tract: No evidence of urolithiasis or hydronephrosis. No definite mass visualized on this un-enhanced exam. Stomach/Bowel: No evidence of obstruction, inflammatory process, or abnormal fluid collections. Large colonic stool burden noted. Vascular/Lymphatic: No pathologically enlarged lymph nodes. 9 mm left common iliac lymph node is decreased in size from 15 mm on previous study. No evidence of abdominal aortic aneurysm. Reproductive: Prior hysterectomy noted. Adnexal regions are unremarkable in appearance. Other: None. Musculoskeletal:  No suspicious bone lesions identified. IMPRESSION: No acute findings identified within the abdomen or pelvis. Large stool burden noted; suggest clinical correlation for possible constipation. Electronically Signed   By: Earle Gell M.D.   On: 02/20/2015 23:44    PHYSICAL EXAM General: NAD Neck: JVP 8 cm, no thyromegaly or thyroid nodule.  Lungs: Clear to auscultation bilaterally with normal respiratory effort. CV: Nondisplaced PMI.  Heart regular S1/S2, no S3/S4, 2/6 HSM LLSB. No edema.   Abdomen: Soft, nontender, no hepatosplenomegaly, no distention.  Neurologic: Alert and oriented x 3.  Psych: Normal affect. Extremities: No clubbing or cyanosis.   TELEMETRY: Reviewed telemetry pt in NSR     Assessment:  1. 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 AVMs on colonoscopy 09/2013. 02/21/15 EGD: normal. Last episode hematochezia 12/10 evening. 2. 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 CKD III. Creatinine baseline 1.8-2.1  4. Hypothyroidism  5. PAF:  has not been anticoagulated due to GI bleeding. Now in NSR.  6. H/o Pericardial Effusion.    Plan/Discussion:   Ms Kilcrease is a 64 year old with cor pulmonale due to severe PAH admitted with GI bleed. EGD did not show any abnormalities.  No further hematochezia.  Hemoglobin higher today.  Given history of colonic AVMs and hematochezia, she will have colonoscopy today with help of anesthesiology.    At home, she is on ambrisentan 10 mg daily, selexipag 1600 mcg twice a day, and adicira 40 mg daily for pulmonary hypertension.  She was started back on selexipag and ambrisentan.  SBP in 80s-90s, which is normal range for her (runs 80s-90s as outpatient).  She was restarted on Adcirca 20 mg daily yesterday, can increase to home 40 mg daily today.  Prior to admit she was on torsemide 60 mg twice a day + metolazone 2.5 mg once a week.  I restarted her home torsemide yesterday. Weight is down, creatinine stable.  Will hold am torsemide dose today prior to colonoscopy, can resume in afternoon.   [name] 02/24/2015 7:47 AM

## 2015-02-24 NOTE — Progress Notes (Signed)
Subjective:  [name] was seen and examined this morning. She reported no bloody bowel movements overnight. She denies any abdominal pain, and has no complaints.  Objective: Vital signs in last 24 hours: Filed Vitals:   02/24/15 0600 02/24/15 0700 02/24/15 0728 02/24/15 1145  BP: 82/52  81/49 80/43  Pulse:      Temp:   97.8 F (36.6 C) 97.9 F (36.6 C)  TempSrc:   Oral   Resp: _0 Height:      Weight:      SpO2: 97% 97%  100%   Weight change: -6 lb 13.4 oz (-3.1 kg)  Intake/Output Summary (Last 24 hours) at 02/24/15 1159 Last data filed at 02/24/15 0800  Gross per 24 hour  Intake   1550 ml  Output   3300 ml  Net  -1750 ml   General: Vital signs reviewed.  Patient is in no acute distress and cooperative with exam.  Cardiovascular: Bradycardic, regular rhythm. Pulmonary/Chest: Clear to ausculation bilaterally Abdominal: Soft, non-tender, non-distended, BS +, no guarding present.  Extremities: No lower extremity edema bilaterally Skin: Warm, dry and intact. Psychiatric: Normal mood and affect.   Lab Results: Basic Metabolic Panel:  Recent Labs Lab 02/23/15 0622 02/24/15 0323  NA 142 140  K 3.7 4.2  CL 111 108  CO2 24 23  GLUCOSE 85 87  BUN 62* 46*  CREATININE 1.48* 1.37*  CALCIUM 8.9 9.0   Liver Function Tests:  Recent Labs Lab 02/20/15 2111 02/22/15 1000  AST 22  --   ALT 11*  --   ALKPHOS 78  --   BILITOT 0.3 0.7  PROT 6.5  --   ALBUMIN 3.1*  --     Recent Labs Lab 02/20/15 2111  LIPASE 29   CBC:  Recent Labs Lab 02/23/15 0622 02/24/15 0323  WBC 5.6 4.8  HGB 8.2* 9.8*  HCT 24.9* 30.9*  MCV 85.6 87.3  PLT 114* 107*   Thyroid Function Tests:  Recent Labs Lab 02/21/15 1201  TSH 3.314   Coagulation:  Recent Labs Lab 02/22/15 1705  LABPROT 16.5*  INR 1.32   Anemia Panel:  Recent Labs Lab 02/22/15 1000  RETICCTPCT 4.9*   Urine Drug Screen: Drugs of Abuse     Component Value Date/Time   LABOPIA POSITIVE*  10/02/2010 1128   COCAINSCRNUR NONE DETECTED 10/02/2010 1128   LABBENZ NONE DETECTED 10/02/2010 1128   AMPHETMU NONE DETECTED 10/02/2010 1128   THCU NONE DETECTED 10/02/2010 1128   LABBARB NONE DETECTED 10/02/2010 1128    Urinalysis:  Recent Labs Lab 02/20/15 2145 02/23/15 0843  COLORURINE YELLOW YELLOW  LABSPEC 1.007 1.014  PHURINE 7.0 6.0  GLUCOSEU NEGATIVE NEGATIVE  HGBUR LARGE* LARGE*  BILIRUBINUR NEGATIVE NEGATIVE  KETONESUR NEGATIVE NEGATIVE  PROTEINUR NEGATIVE NEGATIVE  NITRITE NEGATIVE NEGATIVE  LEUKOCYTESUR TRACE* SMALL*   Medications:  I have reviewed the patient's current medications. Prior to Admission:  Prescriptions prior to admission  Medication Sig Dispense Refill Last Dose  . ADCIRCA 20 MG TABS TAKE 2 TABLETS (40MG) BY MOUTH ONCE DAILY WITH OR WITHOUT FOOD. CALL [phone] FOR REFILLS. 60 tablet 11 02/20/2015 at Unknown time  . ambrisentan (LETAIRIS) 10 MG tablet Take 1 tablet (10 mg total) by mouth daily. 30 tablet 11 02/20/2015 at Unknown time  . amiodarone (PACERONE) 200 MG tablet Take 1 tablet (200 mg total) by mouth daily. 30 tablet 6 02/20/2015 at Unknown time  . esomeprazole (NEXIUM) 40 MG capsule Take 40 mg by  mouth 2 (two) times daily.    02/20/2015 at Unknown time  . fenofibrate 54 MG tablet Take 1 tablet (54 mg total) by mouth daily. 30 tablet 12 02/20/2015 at Unknown time  . FLUoxetine (PROZAC) 20 MG capsule take 1 capsule by mouth every morning 30 capsule 2 02/20/2015 at Unknown time  . HYDROcodone-acetaminophen (NORCO) 10-325 MG per tablet Take 1 tablet by mouth every 6 (six) hours as needed for moderate pain.    PRN  . levothyroxine (SYNTHROID, LEVOTHROID) 25 MCG tablet Take 1 tablet (25 mcg total) by mouth daily before breakfast. 30 tablet 11 02/20/2015 at Unknown time  . magnesium oxide (MAG-OX) 400 MG tablet Take 400 mg by mouth 2 (two) times daily.   02/20/2015 at Unknown time  . metolazone (ZAROXOLYN) 2.5 MG tablet Take 1 tablet (2.5 mg total) by  mouth once a week. On Fridays 10 tablet 3 02/14/2015  . nortriptyline (PAMELOR) 10 MG capsule Take 20 mg by mouth at bedtime.   02/19/2015 at Unknown time  . potassium chloride SA (K-DUR,KLOR-CON) 20 MEQ tablet Take 20 mEq by mouth daily.   02/20/2015 at Unknown time  . pregabalin (LYRICA) 150 MG capsule Take 1 capsule (150 mg total) by mouth 3 (three) times daily. 90 capsule 5 02/20/2015 at Unknown time  . Selexipag (UPTRAVI) 1600 MCG TABS Take 1,600 mcg by mouth 2 (two) times daily. 60 tablet 3 02/20/2015 at Unknown time  . torsemide (DEMADEX) 20 MG tablet Take 3 tablets (60 mg total) by mouth 2 (two) times daily. 180 tablet 3 02/20/2015 at Unknown time   Scheduled Meds: . ambrisentan  10 mg Oral Daily  . amiodarone  200 mg Oral Daily  . levothyroxine  25 mcg Oral QAC breakfast  . potassium chloride  40 mEq Oral Daily  . pregabalin  150 mg Oral TID  . Selexipag  1,600 mcg Oral BID  . sodium chloride  3 mL Intravenous Q12H  . Tadalafil (PAH)  40 mg Oral Daily  . torsemide  60 mg Oral BID   Continuous Infusions:  PRN Meds:.albuterol, HYDROcodone-acetaminophen Assessment/Plan: Active Problems:   DM type 2 with diabetic peripheral neuropathy (HCC)   Essential hypertension   SCLERODERMA   CKD (chronic kidney disease), stage III   Acute lower GI bleeding   GI bleed   Symptomatic anemia   Angiodysplasia of colon with hemorrhage  Acute GI Bleed: Patient to receive colonoscopy today to identify source of bleeding. She has known colonic AVMs from a 2015 scan. Hgb in 9's today. -Colonoscopy today -CBC in AM -Transfuse for Hgb <8 -Diet recs to follow after colonscopy  Chronic Microcytic Anemia: Patient has long-standing iron deficiency anemia, receiving IV iron therapy from Dr. Lindi Adie. However, on admission she was noted to have large Hgb dipstick, but only 0-5 RBCs in urine. There was concern for hemolytic anemia as the patient's anemia had been resistant to transfusions; however, her  hemoglobin improved to 9 after 5 transfusions. T.bili, LDH normal. Retic count elevated at 4.9. Smear showed polychromasia, but no schistocytes. Haptoglobin normal -Repeat CBC tomorrow am  Hypotension: SBP is the 80-90s this morning which is about at her baseline.  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 Home regimen includestorsemide 60 mg BID, metolazone weekly, ambrisentan 10 mg, selexapag 1600 mcg bid, adcirca 40 mg daily. Weight has trended 137>138>141 this morning. She denies shortness of breath. Crackles auscultated on exam in LLL consistent  with exam findings from yesterday. No LEE b/l.  -Cardiology following, appreciate recommendations -Selexipag 1600 mcg QD -Ambrisentan 10 mg daily -Adcirca 40 mg daily -Holding metolazone and torsemide for now, watching fluid status, consider restarting given weight gain, although asymptomatic  Chronic Kidney Disease: Baseline creatinine appears to be around 1.7. Creatinine this morning 1.37.  -Continue to monitor  Scleroderma: Follows with Dr. Amil Amen per chart review. Not currently on steroids or medications for this. No signs on exam. -Outpatient follow up  Hypothyroidism: TSH 3.3 in hospital.  -Continue 25 mcg Synthroid daily  Peripheral Neuropathy: Reordered home pregabalin. Holding amitriptyline given hypotension. -Continue pregabalin 150 mg TID  Paroxysmal Atrial Fibrillation: Patient is on amiodarone 200 mg daily at home. -Continue amiodarone 200 mg daily  DVT/PE ppx: SCDs FEN: NPO Code: FULL  Dispo: Disposition is deferred at this time, awaiting improvement of current medical problems.  Anticipated discharge in approximately 1-2 day(s).   The patient does have a current PCP Sid Falcon, MD) and does need an [facility] hospital follow-up appointment after discharge.  The patient does not have transportation limitations that hinder  transportation to clinic appointments.   LOS: 4 days   Liberty Handy, MD PGY-1 Internal Medicine Resident Pager # [phone]  02/24/2015 11:59 AM

## 2015-02-24 NOTE — Op Note (Signed)
[facility] [place], [address]   COLONOSCOPY PROCEDURE REPORT  PATIENT: [name], [name]  MR#: [id number] BIRTHDATE: 01-01-1951 , 64  yrs. old GENDER: female ENDOSCOPIST: Gatha Mayer, MD, [facility] PROCEDURE DATE:  02/24/2015 PROCEDURE:   Colonoscopy, diagnostic and Colonoscopy with ablation  ASA CLASS:   Class IV INDICATIONS:Evaluation of unexplained GI bleeding and Patient is not applicable for Colorectal Neoplasm Risk Assessment for this procedure. MEDICATIONS: 125, Benadryl 25 mg IV, and Versed 2 mg IV  DESCRIPTION OF PROCEDURE:   After the risks benefits and alternatives of the procedure were thoroughly explained, informed consent was obtained.  The digital rectal exam revealed no rectal mass and revealed decreased sphincter tone.   The EC-[id number] ([id number])  endoscope was introduced through the anus and advanced to the cecum, which was identified by both the appendix and ileocecal valve. No adverse events experienced.   The quality of the prep was good.  (MoviPrep was used)  The instrument was then slowly withdrawn as the colon was fully examined. Estimated blood loss is zero unless otherwise noted in this procedure report.      COLON FINDINGS: Numerous non-bleeding AVM's - mostly in Left colon and rectum - largest 5-6 mm.  A few in right colon ?? - faint.  I ablated 10-15 in left colon to see if that helps her.   Internal hemorrhoids were found.  Retroflexed views revealed internal hemorrhoids. The time to cecum = 7.3 Withdrawal time = 17.6   The scope was withdrawn and the procedure completed. COMPLICATIONS: There were no immediate complications.  ENDOSCOPIC IMPRESSION: 1.   Numerous non-bleeding AVM's - mostly in Left colon and rectum - largest 5-6 mm.  A few in right colon ?? - faint.  I ablated 10-15 in left colon to see if that helps her 2.   Internal hemorrhoids  RECOMMENDATIONS: Observe - it will be hard to achieve  ablation of all of these AVM's so would keep threshold to rescope high in her I suspect.  support with iron infusions and blood as needed  and avoid anti-coagulants  GI f/u will occur when/if needed w/ Dr. Havery Moros, Silverio Decamp or Danis (she is a former [employer] patient)  She tolerated moderate sedation with chronically low BP's  eSigned:  Gatha Mayer, MD, [facility] 02/24/2015 3:45 PM      PATIENT NAME:  [name] MR#: [id number]

## 2015-02-24 NOTE — Care Management Important Message (Signed)
Important Message  Patient Details  Name: [name] MRN: [id number] Date of Birth: [birth year]   Medicare Important Message Given:  Yes    [name] 02/24/2015, 1:57 PM

## 2015-02-25 ENCOUNTER — Encounter (HOSPITAL_COMMUNITY): Payer: Self-pay | Admitting: Internal Medicine

## 2015-02-25 LAB — CBC
HEMATOCRIT: 28.6 % — AB (ref 36.0–46.0)
Hemoglobin: 9.1 g/dL — ABNORMAL LOW (ref 12.0–15.0)
MCH: 28.4 pg (ref 26.0–34.0)
MCHC: 31.8 g/dL (ref 30.0–36.0)
MCV: 89.4 fL (ref 78.0–100.0)
PLATELETS: 132 10*3/uL — AB (ref 150–400)
RBC: 3.2 MIL/uL — ABNORMAL LOW (ref 3.87–5.11)
RDW: 20.7 % — AB (ref 11.5–15.5)
WBC: 8 10*3/uL (ref 4.0–10.5)

## 2015-02-25 LAB — BASIC METABOLIC PANEL
Anion gap: 8 (ref 5–15)
BUN: 37 mg/dL — AB (ref 6–20)
CALCIUM: 8.8 mg/dL — AB (ref 8.9–10.3)
CO2: 25 mmol/L (ref 22–32)
CREATININE: 1.55 mg/dL — AB (ref 0.44–1.00)
Chloride: 104 mmol/L (ref 101–111)
GFR calc Af Amer: 40 mL/min — ABNORMAL LOW (ref >60)
GFR, EST NON AFRICAN AMERICAN: 35 mL/min — AB (ref >60)
Glucose, Bld: 96 mg/dL (ref 65–99)
POTASSIUM: 3.9 mmol/L (ref 3.5–5.1)
SODIUM: 137 mmol/L (ref 135–145)

## 2015-02-25 LAB — HEMOSIDERIN, URINE: HEMOSIDERIN QUAL UR: NEGATIVE

## 2015-02-25 MED ORDER — TORSEMIDE 20 MG PO TABS: 40.0000 mg | ORAL_TABLET | Freq: Every day | ORAL | Status: DC

## 2015-02-25 MED ORDER — POTASSIUM CHLORIDE CRYS ER 20 MEQ PO TBCR
20.0000 meq | EXTENDED_RELEASE_TABLET | Freq: Every day | ORAL | Status: DC
  Administered 2015-02-25 – 2015-02-26 (×2): 20 meq via ORAL
  Filled 2015-02-25 (×2): qty 1

## 2015-02-25 MED ORDER — TORSEMIDE 20 MG PO TABS: 60.0000 mg | ORAL_TABLET | Freq: Every day | ORAL | Status: DC

## 2015-02-25 MED ORDER — SODIUM CHLORIDE 0.9 % IV SOLN
510.0000 mg | Freq: Once | INTRAVENOUS | Status: AC
  Administered 2015-02-25: 510 mg via INTRAVENOUS
  Filled 2015-02-25 (×2): qty 17

## 2015-02-25 NOTE — Progress Notes (Signed)
  Date: 02/25/2015  Patient name: [name]  Medical record number: [id number]  Date of birth: 1950-10-10   This patient has been seen and the plan of care was discussed with the house staff. Please see their note for complete details. I concur with their findings with the following additions/corrections: [name] wants to go home today but she was dizzy and orthostatic. IVF CI 2/2 Pul HTN. Directics being held. We are getting PT eval and rechecking orthostatics and sxs later today. Pt states she she has no services (PT, aide, RN) at home. Would like weekly CBC to ensure HgB is stable.   Bartholomew Crews, MD 02/25/2015, 2:48 PM

## 2015-02-25 NOTE — Progress Notes (Signed)
Patient ID: [name], female   DOB: 1951-02-27, 64 y.o.   MRN: [id number]    SUBJECTIVE:  [name] is a 64 year old with a history of DM2, HLD, HTN, pHTN, GERD, gastroparesis, scleroderma, DJD, cor pulmonale, severe PAH, hypothyroidism, and anemia (history of duodenal and colonic AVMs) admitted with GI bleed.   She is followed closely in the HF clinic and was last seen November 22nd. She was volume overloaded. She continued on torsemide 60 mg twice a day with metolazone added weekly. Pulmonary HTN regimen included: ambrisentan 10 mg, selexapag 1600 mcg bid, adcirca 40 mg daily. Weight at that visit was 147 pounds.  12/8 she had melena and presented to [facility] ED. Hgb on admit was 5.5. Received 3U PRBCs.  On 12/9, had EGD without any significant findings.  She has had an additional 2 units PRBCs.   high 70s-90s, this is her baseline. Todays  Hemoglobin 9.0. Colonoscopy showed multiple AVMs with 10-15 ablated. Yesterday adcirca was increased and torsemide was continued.   Today she is complaining of mild dizziness.  Denies SOB.   Scheduled Meds: . ambrisentan  10 mg Oral Daily  . amiodarone  200 mg Oral Daily  . ferumoxytol  510 mg Intravenous Once  . levothyroxine  25 mcg Oral QAC breakfast  . potassium chloride  40 mEq Oral Daily  . pregabalin  150 mg Oral TID  . Selexipag  1,600 mcg Oral BID  . sodium chloride  3 mL Intravenous Q12H  . Tadalafil (PAH)  40 mg Oral Daily  . torsemide  60 mg Oral BID   Continuous Infusions:  PRN Meds:.albuterol, HYDROcodone-acetaminophen   Filed Vitals:   02/25/15 0000 02/25/15 0210 02/25/15 0411 02/25/15 0719  BP: _0 81/48  Pulse:    61  Temp:   98.1 F (36.7 C) 98 F (36.7 C)  TempSrc:   Oral Oral  Resp: _1 Height:      Weight:   135 lb 9.3 oz (61.5 kg)   SpO2:   97% 96%    Intake/Output Summary (Last 24 hours) at 02/25/15 0743 Last data filed at 02/25/15 0112  Gross per 24 hour  Intake    952 ml  Output    2350 ml  Net  -1398 ml    LABS: Basic Metabolic Panel:  Recent Labs  02/24/15 0323 02/25/15 0311  NA 140 137  K 4.2 3.9  CL 108 104  CO2 23 25  GLUCOSE 87 96  BUN 46* 37*  CREATININE 1.37* 1.55*  CALCIUM 9.0 8.8*   Liver Function Tests:  Recent Labs  02/22/15 1000  BILITOT 0.7   No results for input(s): LIPASE, AMYLASE in the last 72 hours. CBC:  Recent Labs  02/24/15 0323 02/25/15 0311  WBC 4.8 8.0  HGB 9.8* 9.1*  HCT 30.9* 28.6*  MCV 87.3 89.4  PLT 107* 132*   Cardiac Enzymes: No results for input(s): CKTOTAL, CKMB, CKMBINDEX, TROPONINI in the last 72 hours. BNP: Invalid input(s): POCBNP D-Dimer: No results for input(s): DDIMER in the last 72 hours. Hemoglobin A1C: No results for input(s): HGBA1C in the last 72 hours. Fasting Lipid Panel: No results for input(s): CHOL, HDL, LDLCALC, TRIG, CHOLHDL, LDLDIRECT in the last 72 hours. Thyroid Function Tests: No results for input(s): TSH, T4TOTAL, T3FREE, THYROIDAB in the last 72 hours.  Invalid input(s): FREET3 Anemia Panel:  Recent Labs  02/22/15 1000  RETICCTPCT 4.9*    RADIOLOGY: Dg Chest Port 1  View  02/20/2015  CLINICAL DATA:  Acute onset of generalized chest pain and anemia. Generalized weakness. Initial encounter. EXAM: PORTABLE CHEST 1 VIEW COMPARISON:  Chest radiograph performed 12/24/2014 FINDINGS: The lungs are well-aerated. Mild right basilar and left midlung airspace opacities raise concern for mild pneumonia. There is no evidence of pleural effusion or pneumothorax. The cardiomediastinal silhouette is borderline enlarged. No acute osseous abnormalities are seen. IMPRESSION: Mild right basilar and left midlung airspace opacities raise concern for mild pneumonia. Borderline cardiomegaly. Electronically Signed   By: Garald Balding M.D.   On: 02/20/2015 22:25   Ct Renal Stone Study  02/20/2015  CLINICAL DATA:  Generalized abdominal pain, swelling, diarrhea. Bright red blood per rectum beginning  yesterday. Severe anemia. EXAM: CT ABDOMEN AND PELVIS WITHOUT CONTRAST TECHNIQUE: Multidetector CT imaging of the abdomen and pelvis was performed following the standard protocol without IV contrast. COMPARISON:  03/11/2014 FINDINGS: Lower chest: No acute findings. Previously seen pericardial effusion is resolved. Hepatobiliary: Probable small hepatic cysts appears stable. No definite liver masses visualized on this unenhanced exam. Gallbladder is unremarkable. Pancreas: No mass or inflammatory process identified on this un-enhanced exam. Spleen: Within normal limits in size. Adrenals/Urinary Tract: No evidence of urolithiasis or hydronephrosis. No definite mass visualized on this un-enhanced exam. Stomach/Bowel: No evidence of obstruction, inflammatory process, or abnormal fluid collections. Large colonic stool burden noted. Vascular/Lymphatic: No pathologically enlarged lymph nodes. 9 mm left common iliac lymph node is decreased in size from 15 mm on previous study. No evidence of abdominal aortic aneurysm. Reproductive: Prior hysterectomy noted. Adnexal regions are unremarkable in appearance. Other: None. Musculoskeletal:  No suspicious bone lesions identified. IMPRESSION: No acute findings identified within the abdomen or pelvis. Large stool burden noted; suggest clinical correlation for possible constipation. Electronically Signed   By: Earle Gell M.D.   On: 02/20/2015 23:44    PHYSICAL EXAM Orthostatic  Lying SBP 89 Sitting  SBP 79  General: NAD Neck: JVP 85-6 cm, no thyromegaly or thyroid nodule.  Lungs: Clear to auscultation bilaterally with normal respiratory effort. CV: Nondisplaced PMI.  Heart regular S1/S2, no S3/S4, 2/6 HSM LLSB. No edema.   Abdomen: Soft, nontender, no hepatosplenomegaly, no distention.  Neurologic: Alert and oriented x 3.  Psych: Normal affect. Extremities: No clubbing or cyanosis.   TELEMETRY: Reviewed telemetry pt in NSR     Assessment:  1. Acute GI bleed  with symptomatic anemia: 03/2014 ablation duodenal AMV. AVMs on colonoscopy 09/2013. 02/21/15 EGD: normal. Last episode hematochezia 12/10 evening.  10-15 AVMs ablated on colonoscopy 12/12.  2. 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 CKD III. Creatinine baseline 1.8-2.1  4. Hypothyroidism  5. PAF: has not been anticoagulated due to GI bleeding. Now in NSR.  6. H/o Pericardial Effusion.    Plan/Discussion:   [name] is a 64 year old with cor pulmonale due to severe PAH admitted with GI bleed. EGD did not show any abnormalities.  Colonoscopy showed multiple AVMS with 10-15 ablated.   At home, she is on ambrisentan 10 mg daily, selexipag 1600 mcg twice a day, and adicira 40 mg daily for pulmonary hypertension.  She was started back on selexipag and ambrisentan.  SBP in 80s-90s, which is normal range for her (runs 80s-90s as outpatient).  Continue adcirca 40 mg daily.   Volume status low. Orthostatic. Hold torsemide today then restart torsemide 40 mg daily tomorrow.  Renal function up a little.      Amy Clegg NP-C  02/25/2015 7:43 AM       Patient seen with NP, agree with the above note.  Multiple AVMs ablated yesterday.  She is back on her home meds.  She is orthostatic today.  Would hold torsemide today, restart at 40 mg daily tomorrow.  Continue her pulmonary hypertension meds.   [name] 02/25/2015 8:07 AM

## 2015-02-25 NOTE — Progress Notes (Signed)
Pt's latest BP 79/40 with MAP of 53. Per MD order, MD to be paged. Pt found sleeping, asymptomatic by this RN. MD on call, Eli Hose made aware and no new orders received at this time. Will continue to monitor it.

## 2015-02-25 NOTE — Progress Notes (Signed)
Daily Rounding Note  02/25/2015, 9:25 AM  LOS: 5 days   SUBJECTIVE:       No melena per RN.  Last BM was during bowel prep 12/12.  C/o headache and some left chest and upper quadrant discomfort: not new.   No n/v and eating better.  Ate 100% of recent meals  OBJECTIVE:         Vital signs in last 24 hours:    Temp:  [97.3 F (36.3 C)-98.5 F (36.9 C)] 98 F (36.7 C) (12/13 0719) Pulse Rate:  [51-61] 61 (12/13 0719) Resp:  [12-27] 12 (12/13 0719) BP: (68-90)/(26-53) 81/48 mmHg (12/13 0719) SpO2:  [91 %-100 %] 96 % (12/13 0719) Weight:  [61.5 kg (135 lb 9.3 oz)] 61.5 kg (135 lb 9.3 oz) (12/13 0411) Last BM Date: 02/24/15 Filed Weights   02/23/15 0300 02/24/15 0328 02/25/15 0411  Weight: 64.3 kg (141 lb 12.1 oz) 61.2 kg (134 lb 14.7 oz) 61.5 kg (135 lb 9.3 oz)   General: pleasant, comfortable, cachectic and chronically ill looking.  Looks better than at 12/8 arrival.    Heart: RRR Chest: diminished but clear, no dyspnea or cough Abdomen: soft, NT, hypoactive BS, slight distention  Extremities: no CCE Neuro/Psych:  Pleasant, calm, alert.  Oriented x 3.   No tremor or gross deficits.   Intake/Output from previous day: 12/12 0701 - 12/13 0700 In: 952 [P.O.:952] Out: 2350 [Urine:2350]  Intake/Output this shift:    Lab Results:  Recent Labs  02/23/15 0622 02/24/15 0323 02/25/15 0311  WBC 5.6 4.8 8.0  HGB 8.2* 9.8* 9.1*  HCT 24.9* 30.9* 28.6*  PLT 114* 107* 132*   BMET  Recent Labs  02/23/15 0622 02/24/15 0323 02/25/15 0311  NA 142 140 137  K 3.7 4.2 3.9  CL 111 108 104  CO2 _0 GLUCOSE 85 87 96  BUN 62* 46* 37*  CREATININE 1.48* 1.37* 1.55*  CALCIUM 8.9 9.0 8.8*   LFT  Recent Labs  02/22/15 1000  BILITOT 0.7   PT/INR  Recent Labs  02/22/15 1705  LABPROT 16.5*  INR 1.32   Hepatitis Panel No results for input(s): HEPBSAG, HCVAB, HEPAIGM, HEPBIGM in the last 72  hours.  Studies/Results: No results found.  ASSESMENT:   * GIB and anemia. Acute on chronic anemia. 03/2014 ablation duodenal avm.Iron infusions x 2, 12/2014 and 01/2015. S/p 3 PRBCs. AVMs on colonoscopy 09/2013 S/p PRBCs x 3.  Hgb down a bit but no further melena.  02/21/15 EGD: normal.  02/24/15 colonoscopy: Multiple AVMs, mostly in left colon and rectum.  A/p ablation of 10 to 15 in the left colon but too many present to entirely ablate.  Plan is to support with Iron infusions, PRBC transfusions.      *  Thrombocytopenia.  Non-critical.   * AKI. Improved.     PLAN   *  Will sign off.  GI available prn.  No plans to repeat colonoscopy: "it will be hard to achieve ablation of all of these AVM's so would keep threshold to rescope high... Support with iron infusions and blood as needed and avoid anti-coagulants"   * Has follow up with hematologist Dr Lindi Adie on 02/22/16.       Azucena Freed  02/25/2015, 9:25 AM Pager: [phone]    [facility] Attending   I have taken an interval history, reviewed the chart and examined the patient. I agree with the Advanced Practitioner's note,  impression and recommendations.   In her case GI will be available as needed and she will f/u hematology and CHF clinics. Repeated routine endoscopic treatment not in her best interest. Hope what I did helps.  Signing off  Gatha Mayer, MD, [facility] [phone] (pager) [phone] after 5 PM, weekends and holidays  02/25/2015 11:28 AM

## 2015-02-25 NOTE — Progress Notes (Signed)
Subjective:  [name] says she is feeling well and ready to go home. However, she says she feel dizzy upon standing. No melena or hematochezia overnight.   Objective: Vital signs in last 24 hours: Filed Vitals:   02/25/15 0000 02/25/15 0210 02/25/15 0411 02/25/15 0719  BP: _0 81/48  Pulse:    61  Temp:   98.1 F (36.7 C) 98 F (36.7 C)  TempSrc:   Oral Oral  Resp: _1 Height:      Weight:   135 lb 9.3 oz (61.5 kg)   SpO2:   97% 96%   Weight change: 10.6 oz (0.3 kg)  Intake/Output Summary (Last 24 hours) at 02/25/15 0859 Last data filed at 02/25/15 0112  Gross per 24 hour  Intake    862 ml  Output   1350 ml  Net   -488 ml   General: Vital signs reviewed.  Patient is in no acute distress and cooperative with exam.  Cardiovascular: RRR Pulmonary/Chest: Clear to ausculation bilaterally Abdominal: Soft, non-tender, non-distended, BS +, no guarding present.  Extremities: No lower extremity edema bilaterally Skin: Warm, dry and intact. Psychiatric: Normal mood and affect.   Lab Results: Basic Metabolic Panel:  Recent Labs Lab 02/24/15 0323 02/25/15 0311  NA 140 137  K 4.2 3.9  CL 108 104  CO2 23 25  GLUCOSE 87 96  BUN 46* 37*  CREATININE 1.37* 1.55*  CALCIUM 9.0 8.8*   Liver Function Tests:  Recent Labs Lab 02/20/15 2111 02/22/15 1000  AST 22  --   ALT 11*  --   ALKPHOS 78  --   BILITOT 0.3 0.7  PROT 6.5  --   ALBUMIN 3.1*  --     Recent Labs Lab 02/20/15 2111  LIPASE 29   CBC:  Recent Labs Lab 02/24/15 0323 02/25/15 0311  WBC 4.8 8.0  HGB 9.8* 9.1*  HCT 30.9* 28.6*  MCV 87.3 89.4  PLT 107* 132*   Thyroid Function Tests:  Recent Labs Lab 02/21/15 1201  TSH 3.314   Coagulation:  Recent Labs Lab 02/22/15 1705  LABPROT 16.5*  INR 1.32   Anemia Panel:  Recent Labs Lab 02/22/15 1000  RETICCTPCT 4.9*   Urine Drug Screen: Drugs of Abuse     Component Value Date/Time   LABOPIA POSITIVE*  10/02/2010 1128   COCAINSCRNUR NONE DETECTED 10/02/2010 1128   LABBENZ NONE DETECTED 10/02/2010 1128   AMPHETMU NONE DETECTED 10/02/2010 1128   THCU NONE DETECTED 10/02/2010 1128   LABBARB NONE DETECTED 10/02/2010 1128    Urinalysis:  Recent Labs Lab 02/20/15 2145 02/23/15 0843  COLORURINE YELLOW YELLOW  LABSPEC 1.007 1.014  PHURINE 7.0 6.0  GLUCOSEU NEGATIVE NEGATIVE  HGBUR LARGE* LARGE*  BILIRUBINUR NEGATIVE NEGATIVE  KETONESUR NEGATIVE NEGATIVE  PROTEINUR NEGATIVE NEGATIVE  NITRITE NEGATIVE NEGATIVE  LEUKOCYTESUR TRACE* SMALL*   Medications:  I have reviewed the patient's current medications. Prior to Admission:  Prescriptions prior to admission  Medication Sig Dispense Refill Last Dose  . ADCIRCA 20 MG TABS TAKE 2 TABLETS (40MG) BY MOUTH ONCE DAILY WITH OR WITHOUT FOOD. CALL [phone] FOR REFILLS. 60 tablet 11 02/20/2015 at Unknown time  . ambrisentan (LETAIRIS) 10 MG tablet Take 1 tablet (10 mg total) by mouth daily. 30 tablet 11 02/20/2015 at Unknown time  . amiodarone (PACERONE) 200 MG tablet Take 1 tablet (200 mg total) by mouth daily. 30 tablet 6 02/20/2015 at Unknown time  . esomeprazole (NEXIUM) 40 MG  capsule Take 40 mg by mouth 2 (two) times daily.    02/20/2015 at Unknown time  . fenofibrate 54 MG tablet Take 1 tablet (54 mg total) by mouth daily. 30 tablet 12 02/20/2015 at Unknown time  . FLUoxetine (PROZAC) 20 MG capsule take 1 capsule by mouth every morning 30 capsule 2 02/20/2015 at Unknown time  . HYDROcodone-acetaminophen (NORCO) 10-325 MG per tablet Take 1 tablet by mouth every 6 (six) hours as needed for moderate pain.    PRN  . levothyroxine (SYNTHROID, LEVOTHROID) 25 MCG tablet Take 1 tablet (25 mcg total) by mouth daily before breakfast. 30 tablet 11 02/20/2015 at Unknown time  . magnesium oxide (MAG-OX) 400 MG tablet Take 400 mg by mouth 2 (two) times daily.   02/20/2015 at Unknown time  . metolazone (ZAROXOLYN) 2.5 MG tablet Take 1 tablet (2.5 mg total) by  mouth once a week. On Fridays 10 tablet 3 02/14/2015  . nortriptyline (PAMELOR) 10 MG capsule Take 20 mg by mouth at bedtime.   02/19/2015 at Unknown time  . potassium chloride SA (K-DUR,KLOR-CON) 20 MEQ tablet Take 20 mEq by mouth daily.   02/20/2015 at Unknown time  . pregabalin (LYRICA) 150 MG capsule Take 1 capsule (150 mg total) by mouth 3 (three) times daily. 90 capsule 5 02/20/2015 at Unknown time  . Selexipag (UPTRAVI) 1600 MCG TABS Take 1,600 mcg by mouth 2 (two) times daily. 60 tablet 3 02/20/2015 at Unknown time  . torsemide (DEMADEX) 20 MG tablet Take 3 tablets (60 mg total) by mouth 2 (two) times daily. 180 tablet 3 02/20/2015 at Unknown time   Scheduled Meds: . ambrisentan  10 mg Oral Daily  . amiodarone  200 mg Oral Daily  . ferumoxytol  510 mg Intravenous Once  . levothyroxine  25 mcg Oral QAC breakfast  . potassium chloride  20 mEq Oral Daily  . pregabalin  150 mg Oral TID  . Selexipag  1,600 mcg Oral BID  . sodium chloride  3 mL Intravenous Q12H  . Tadalafil (PAH)  40 mg Oral Daily  . [START ON 02/26/2015] torsemide  40 mg Oral Daily   Continuous Infusions:  PRN Meds:.albuterol, HYDROcodone-acetaminophen Assessment/Plan: Active Problems:   DM type 2 with diabetic peripheral neuropathy (HCC)   Essential hypertension   SCLERODERMA   CKD (chronic kidney disease), stage III   Acute lower GI bleeding   GI bleed   Symptomatic anemia   Angiodysplasia of colon with hemorrhage  Acute GI Bleed: Patient to receive colonoscopy today to identify source of bleeding. She has known colonic AVMs from a 2015 scan. Hgb in 9's today. No bleeding overnight. 10-15 left colon AVMs ablated.  Chronic Microcytic Anemia: Patient has long-standing iron deficiency anemia, receiving IV iron therapy from Dr. Lindi Adie. However, on admission she was noted to have large Hgb dipstick, but only 0-5 RBCs in urine. Hemolytic anemia workup negative - Feraheme infusion today  Hypotension: SBP is the 80-90s  this morning which is about at her baseline. She had positive orthostats, with Cr bump to 1.55 - Hold Torsemide this evening, encourage po intake. - PT today.  Severe Pulmonary Arterial Hypertension with Cor Pulmonale: Followed by HF clinic, most recently seen on 11/22. Home regimen includestorsemide 60 mg BID, metolazone weekly, ambrisentan 10 mg, selexapag 1600 mcg bid, adcirca 40 mg daily. Weight has trended to 135 this AM. She denies shortness of breath. Crackles auscultated on exam in LLL consistent with exam findings from yesterday. No LEE b/l.  -Cardiology  following, appreciate recommendations -Selexipag 1600 mcg QD -Ambrisentan 10 mg daily -Adcirca 40 mg daily -Holding metolazone and torsemide  Chronic Kidney Disease: Baseline creatinine appears to be around 1.7. Creatinine this morning 1.55.   Scleroderma: Follows with Dr. Amil Amen per chart review. Not currently on steroids or medications for this. No signs on exam. -Outpatient follow up  Hypothyroidism: TSH 3.3 in hospital.  -Continue 25 mcg Synthroid daily  Peripheral Neuropathy: Reordered home pregabalin. Holding amitriptyline given hypotension. -Continue pregabalin 150 mg TID  Paroxysmal Atrial Fibrillation: Patient is on amiodarone 200 mg daily at home. Has been in NSR. -Continue amiodarone 200 mg daily  DVT/PE ppx: SCDs FEN: NPO Code: FULL  Dispo: Disposition is deferred at this time, awaiting improvement of current medical problems.  Anticipated discharge in approximately 1-2 day(s).   The patient does have a current PCP Sid Falcon, MD) and does need an [facility] hospital follow-up appointment after discharge.  The patient does not have transportation limitations that hinder transportation to clinic appointments.   LOS: 5 days   Liberty Handy, MD PGY-1 Internal Medicine Resident Pager # [phone]  02/25/2015 8:59 AM

## 2015-02-26 LAB — BASIC METABOLIC PANEL
Anion gap: 8 (ref 5–15)
BUN: 37 mg/dL — ABNORMAL HIGH (ref 6–20)
CO2: 23 mmol/L (ref 22–32)
CREATININE: 1.72 mg/dL — AB (ref 0.44–1.00)
Calcium: 8.7 mg/dL — ABNORMAL LOW (ref 8.9–10.3)
Chloride: 104 mmol/L (ref 101–111)
GFR, EST AFRICAN AMERICAN: 35 mL/min — AB (ref >60)
GFR, EST NON AFRICAN AMERICAN: 30 mL/min — AB (ref >60)
Glucose, Bld: 82 mg/dL (ref 65–99)
Potassium: 4.3 mmol/L (ref 3.5–5.1)
SODIUM: 135 mmol/L (ref 135–145)

## 2015-02-26 LAB — CBC
HCT: 26.6 % — ABNORMAL LOW (ref 36.0–46.0)
Hemoglobin: 8.5 g/dL — ABNORMAL LOW (ref 12.0–15.0)
MCH: 28.1 pg (ref 26.0–34.0)
MCHC: 32 g/dL (ref 30.0–36.0)
MCV: 88.1 fL (ref 78.0–100.0)
PLATELETS: 124 10*3/uL — AB (ref 150–400)
RBC: 3.02 MIL/uL — ABNORMAL LOW (ref 3.87–5.11)
RDW: 20.5 % — ABNORMAL HIGH (ref 11.5–15.5)
WBC: 7.4 10*3/uL (ref 4.0–10.5)

## 2015-02-26 MED ORDER — TORSEMIDE 20 MG PO TABS: 40.0000 mg | ORAL_TABLET | Freq: Two times a day (BID) | ORAL | Status: DC

## 2015-02-26 MED ORDER — METOLAZONE 2.5 MG PO TABS: 2.5000 mg | ORAL_TABLET | ORAL | Status: DC | PRN

## 2015-02-26 MED ORDER — TORSEMIDE 20 MG PO TABS
40.0000 mg | ORAL_TABLET | Freq: Every day | ORAL | Status: DC
  Administered 2015-02-26: 40 mg via ORAL
  Filled 2015-02-26: qty 2

## 2015-02-26 NOTE — Progress Notes (Signed)
Patient ID: [name], female   DOB: [birth year], 64 y.o.   MRN: [id number]    SUBJECTIVE:  Ms Balestrieri is a 64 year old with a history of DM2, HLD, HTN, pHTN, GERD, gastroparesis, scleroderma, DJD, cor pulmonale, severe PAH, hypothyroidism, and anemia (history of duodenal and colonic AVMs) admitted with GI bleed.   She is followed closely in the HF clinic and was last seen November 22nd. She was volume overloaded. She continued on torsemide 60 mg twice a day with metolazone added weekly. Pulmonary HTN regimen included: ambrisentan 10 mg, selexapag 1600 mcg bid, adcirca 40 mg daily. Weight at that visit was 147 pounds.  12/8 she had melena and presented to [facility] ED. Hgb on admit was 5.5. Received 3U PRBCs.  On 12/9, had EGD without any significant findings.  She has had an additional 2 units PRBCs.  Yesterday diuretic held as she was orthostatic. Weight up but on a diferent scale. Todays  Hemoglobin 8.5. No BM today.  Colonoscopy showed multiple AVMs with 10-15 ablated.   Denies SOB or dizziness.    Scheduled Meds: . ambrisentan  10 mg Oral Daily  . amiodarone  200 mg Oral Daily  . levothyroxine  25 mcg Oral QAC breakfast  . potassium chloride  20 mEq Oral Daily  . pregabalin  150 mg Oral TID  . Selexipag  1,600 mcg Oral BID  . sodium chloride  3 mL Intravenous Q12H  . Tadalafil (PAH)  40 mg Oral Daily   Continuous Infusions:  PRN Meds:.albuterol, HYDROcodone-acetaminophen   Filed Vitals:   02/25/15 1800 02/25/15 2124 02/26/15 0405 02/26/15 0830  BP: _0   Pulse: 57 59 60   Temp: 97 F (36.1 C) 98.5 F (36.9 C) 98.7 F (37.1 C) 98.7 F (37.1 C)  TempSrc: Oral Oral Oral Oral  Resp: _1 Height:      Weight:  139 lb 12.8 oz (63.413 kg) 145 lb 4.8 oz (65.908 kg)   SpO2: 96% 96% 97% 98%    Intake/Output Summary (Last 24 hours) at 02/26/15 1007 Last data filed at 02/25/15 1700  Gross per 24 hour  Intake    597 ml  Output    350 ml  Net    247 ml     LABS: Basic Metabolic Panel:  Recent Labs  02/25/15 0311 02/26/15 0441  NA 137 135  K 3.9 4.3  CL 104 104  CO2 25 23  GLUCOSE 96 82  BUN 37* 37*  CREATININE 1.55* 1.72*  CALCIUM 8.8* 8.7*   Liver Function Tests: No results for input(s): AST, ALT, ALKPHOS, BILITOT, PROT, ALBUMIN in the last 72 hours. No results for input(s): LIPASE, AMYLASE in the last 72 hours. CBC:  Recent Labs  02/25/15 0311 02/26/15 0441  WBC 8.0 7.4  HGB 9.1* 8.5*  HCT 28.6* 26.6*  MCV 89.4 88.1  PLT 132* 124*   Cardiac Enzymes: No results for input(s): CKTOTAL, CKMB, CKMBINDEX, TROPONINI in the last 72 hours. BNP: Invalid input(s): POCBNP D-Dimer: No results for input(s): DDIMER in the last 72 hours. Hemoglobin A1C: No results for input(s): HGBA1C in the last 72 hours. Fasting Lipid Panel: No results for input(s): CHOL, HDL, LDLCALC, TRIG, CHOLHDL, LDLDIRECT in the last 72 hours. Thyroid Function Tests: No results for input(s): TSH, T4TOTAL, T3FREE, THYROIDAB in the last 72 hours.  Invalid input(s): FREET3 Anemia Panel: No results for input(s): VITAMINB12, FOLATE, FERRITIN, TIBC, IRON, RETICCTPCT in the last 72 hours.  RADIOLOGY:  Dg Chest Port 1 View  02/20/2015  CLINICAL DATA:  Acute onset of generalized chest pain and anemia. Generalized weakness. Initial encounter. EXAM: PORTABLE CHEST 1 VIEW COMPARISON:  Chest radiograph performed 12/24/2014 FINDINGS: The lungs are well-aerated. Mild right basilar and left midlung airspace opacities raise concern for mild pneumonia. There is no evidence of pleural effusion or pneumothorax. The cardiomediastinal silhouette is borderline enlarged. No acute osseous abnormalities are seen. IMPRESSION: Mild right basilar and left midlung airspace opacities raise concern for mild pneumonia. Borderline cardiomegaly. Electronically Signed   By: Garald Balding M.D.   On: 02/20/2015 22:25   Ct Renal Stone Study  02/20/2015  CLINICAL DATA:  Generalized  abdominal pain, swelling, diarrhea. Bright red blood per rectum beginning yesterday. Severe anemia. EXAM: CT ABDOMEN AND PELVIS WITHOUT CONTRAST TECHNIQUE: Multidetector CT imaging of the abdomen and pelvis was performed following the standard protocol without IV contrast. COMPARISON:  03/11/2014 FINDINGS: Lower chest: No acute findings. Previously seen pericardial effusion is resolved. Hepatobiliary: Probable small hepatic cysts appears stable. No definite liver masses visualized on this unenhanced exam. Gallbladder is unremarkable. Pancreas: No mass or inflammatory process identified on this un-enhanced exam. Spleen: Within normal limits in size. Adrenals/Urinary Tract: No evidence of urolithiasis or hydronephrosis. No definite mass visualized on this un-enhanced exam. Stomach/Bowel: No evidence of obstruction, inflammatory process, or abnormal fluid collections. Large colonic stool burden noted. Vascular/Lymphatic: No pathologically enlarged lymph nodes. 9 mm left common iliac lymph node is decreased in size from 15 mm on previous study. No evidence of abdominal aortic aneurysm. Reproductive: Prior hysterectomy noted. Adnexal regions are unremarkable in appearance. Other: None. Musculoskeletal:  No suspicious bone lesions identified. IMPRESSION: No acute findings identified within the abdomen or pelvis. Large stool burden noted; suggest clinical correlation for possible constipation. Electronically Signed   By: Earle Gell M.D.   On: 02/20/2015 23:44    PHYSICAL EXAM General: NAD Sitting in the chair  Neck: JVP 5-6 cm, no thyromegaly or thyroid nodule.  Lungs: Clear to auscultation bilaterally with normal respiratory effort. CV: Nondisplaced PMI.  Heart regular S1/S2, no S3/S4, 2/6 HSM LLSB. No edema.   Abdomen: Soft, nontender, no hepatosplenomegaly, no distention.  Neurologic: Alert and oriented x 3.  Psych: Normal affect. Extremities: No clubbing or cyanosis.   TELEMETRY: Reviewed telemetry pt in  NSR     Assessment:  1. 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 AVMs on colonoscopy 09/2013. 02/21/15 EGD: normal. Last episode hematochezia 12/10 evening.  10-15 AVMs ablated on colonoscopy 12/12.  2. 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 CKD III. Creatinine baseline 1.8-2.1  4. Hypothyroidism  5. PAF: has not been anticoagulated due to GI bleeding. Now in NSR.  6. H/o Pericardial Effusion.    Plan/Discussion:   Ms Howley is a 64 year old with cor pulmonale due to severe PAH admitted with GI bleed. EGD did not show any abnormalities.  Colonoscopy showed multiple AVMS with 10-15 ablated.   At home, she is on ambrisentan 10 mg daily, selexipag 1600 mcg twice a day, and adicira 40 mg daily for pulmonary hypertension.  She was started back on selexipag and ambrisentan.  SBP in 80s-90s, which is normal range for her (runs 80s-90s as outpatient).  Continue adcirca 40 mg daily.   Volume status stable. Start torsemide 40 mg daily. Renal function up a little but within her baseline. .  Change metolazone to as needed.   We will schedule  HF follow up. 03/04/2015 at 2:00.Plan to check BMET    Amy Clegg NP-C  02/26/2015 10:07 AM       Patient seen with NP, agree with the above note.  She is stable today.  Denies lightheadedness.  She can restart torsemide at lower dose of 40 mg daily for home.  Continue her pulmonary hypertension meds.  No further overt bleeding.  She is stable for discharge from a cardiac standpoint.   [name] 02/26/2015 1:18 PM

## 2015-02-26 NOTE — Discharge Instructions (Signed)
[name], you were admitted to the hospital with bleeding from irregular blood vessels in your colon. These blood vessels were treated to prevent them from bleeding. If you notice blood in your stool, please seek medical attention immediately. It is especially worrisome if you have shortness of breath, light-headedness, or chest pain with this bleeding.  You have a problem with anemia (or low blood counts) caused by low iron. You received an iron transfusion in the hospital. Please make sure to follow up with Dr. Lindi Adie for receiving iron transfusions in the future. You will also have your blood counts checked at home by a home health nurse.   Your heart failure doctors also saw you in the hospital for your pulmonary hypertension. All of your medications for your pulmonary hypertension you will continue. Your torsemide dose will be 40 mg.

## 2015-02-26 NOTE — Care Management Note (Signed)
Case Management Note  Patient Details  Name: [name] MRN: [id number] Date of Birth: 03-Apr-1950  Subjective/Objective:      CM following for progression and d/c planning.              Action/Plan: 02/26/2015 Met with pt 02/25/2015 and again this am re [facility] services. Pt questioning how long she would need this services , clearly not wanting this service. This CM clarified with pt that she does not have to agree to services however will need blood draw weekly. Per [facility] this pt has refused HH in the past, not responding to phone calls etc. [facility] will attempt to see this pt again , however they are doubtful that she will accept services.  If this pt refuses visits we are unable to obtain blood draws for CBC as requested.   Expected Discharge Date:    02/26/2015              Expected Discharge Plan:  [place]  In-House Referral:  NA  Discharge planning Services  CM Consult  Post Acute Care Choice:  Home Health Choice offered to:  Patient  DME Arranged:  N/A DME Agency:  NA  HH Arranged:  RN, PT, Nurse's Aide [place] Agency:  [facility]  Status of Service:  Completed, signed off  Medicare Important Message Given:  Yes Date Medicare IM Given:    Medicare IM give by:    Date Additional Medicare IM Given:    Additional Medicare Important Message give by:     If discussed at [place] of Stay Meetings, dates discussed:    Additional Comments:  Adron Bene, RN 02/26/2015, 11:28 AM

## 2015-02-26 NOTE — Discharge Summary (Signed)
Name: [name] MRN: [id number] DOB: 02/25/1951 64 y.o. PCP: Sid Falcon, MD  Date of Admission: 02/20/2015  8:41 PM Date of Discharge: 02/26/2015 Attending Physician: Bartholomew Crews, MD  Discharge Diagnosis: 1. Angiodysplasia of Colon with Hemorrhage 2. Pulmonary Arterial Hypertension 3. Iron Deficiency Anemia  4. DM type 2 with neuropathy  5. CKD Stage 3  Discharge Medications:   Medication List    TAKE these medications        ADCIRCA 20 MG Tabs  Generic drug:  Tadalafil (PAH)  TAKE 2 TABLETS (40MG) BY MOUTH ONCE DAILY WITH OR WITHOUT FOOD. CALL [phone] FOR REFILLS.     ambrisentan 10 MG tablet  Commonly known as:  LETAIRIS  Take 1 tablet (10 mg total) by mouth daily.     amiodarone 200 MG tablet  Commonly known as:  PACERONE  Take 1 tablet (200 mg total) by mouth daily.     esomeprazole 40 MG capsule  Commonly known as:  NEXIUM  Take 40 mg by mouth 2 (two) times daily.     fenofibrate 54 MG tablet  Take 1 tablet (54 mg total) by mouth daily.     FLUoxetine 20 MG capsule  Commonly known as:  PROZAC  take 1 capsule by mouth every morning     HYDROcodone-acetaminophen 10-325 MG tablet  Commonly known as:  NORCO  Take 1 tablet by mouth every 6 (six) hours as needed for moderate pain.     levothyroxine 25 MCG tablet  Commonly known as:  SYNTHROID, LEVOTHROID  Take 1 tablet (25 mcg total) by mouth daily before breakfast.     magnesium oxide 400 MG tablet  Commonly known as:  MAG-OX  Take 400 mg by mouth 2 (two) times daily.     metolazone 2.5 MG tablet  Commonly known as:  ZAROXOLYN  Take 1 tablet (2.5 mg total) by mouth as needed. Only take at discretion of physician.     nortriptyline 10 MG capsule  Commonly known as:  PAMELOR  Take 20 mg by mouth at bedtime.     potassium chloride SA 20 MEQ tablet  Commonly known as:  K-DUR,KLOR-CON  Take 20 mEq by mouth daily.     pregabalin 150 MG capsule  Commonly known as:  LYRICA  Take 1  capsule (150 mg total) by mouth 3 (three) times daily.     Selexipag 1600 MCG Tabs  Commonly known as:  UPTRAVI  Take 1,600 mcg by mouth 2 (two) times daily.     torsemide 20 MG tablet  Commonly known as:  DEMADEX  Take 2 tablets (40 mg total) by mouth 2 (two) times daily.        Disposition and follow-up:   [name] was discharged from [facility] in stable condition.  At the hospital follow up visit please address:  1.  Patient has underlying iron deficiency anemia. In the setting of recent GI bleed, she needs at least weekly CBC checks.  2. She received a Feraheme tranfusion as an inpatient, but may require additional iron transfusions in the future under the guidance of Dr. Lindi Adie.  3. She will need ongoing management of her pulmonary arterial hypertension drugs.  4.  Labs / imaging needed at time of follow-up: CBC  5.  Pending labs/ test needing follow-up: Von Willebrand panel  Follow-up Appointments:     Follow-up Information    Follow up with Darrick Grinder, NP On 03/04/2015.   Specialty:  Cardiology   Why:  Heart Failure Clinic at 2:00   Contact information:   [address] [place] [address] [phone]       Follow up with Rulon Eisenmenger, MD. Schedule an appointment as soon as possible for a visit in 2 weeks.   Specialty:  Hematology and Oncology   Why:  Appointement Date: 03/11/15 _0 :30a.   Contact information:   [place] [id number] [phone]       Discharge Instructions: Discharge Instructions    Diet - low sodium heart healthy    Complete by:  As directed      Increase activity slowly    Complete by:  As directed           [name], you were admitted to the hospital with bleeding from irregular blood vessels in your colon. These blood vessels were treated to prevent them from bleeding. If you notice blood in your stool, please seek medical attention immediately. It is especially worrisome if  you have shortness of breath, light-headedness, or chest pain with this bleeding.  You have a problem with anemia (or low blood counts) caused by low iron. You received an iron transfusion in the hospital. Please make sure to follow up with Dr. Lindi Adie for receiving iron transfusions in the future. You will also have your blood counts checked at home by a home health nurse.   Your heart failure doctors also saw you in the hospital for your pulmonary hypertension. All of your medications for your pulmonary hypertension you will continue. Your torsemide dose will be 40 mg.   Consultations: Treatment Team:  Rounding L[facility]cardiology, MD  Procedures Performed:  Dg Chest Port 1 View  02/20/2015  CLINICAL DATA:  Acute onset of generalized chest pain and anemia. Generalized weakness. Initial encounter. EXAM: PORTABLE CHEST 1 VIEW COMPARISON:  Chest radiograph performed 12/24/2014 FINDINGS: The lungs are well-aerated. Mild right basilar and left midlung airspace opacities raise concern for mild pneumonia. There is no evidence of pleural effusion or pneumothorax. The cardiomediastinal silhouette is borderline enlarged. No acute osseous abnormalities are seen. IMPRESSION: Mild right basilar and left midlung airspace opacities raise concern for mild pneumonia. Borderline cardiomegaly. Electronically Signed   By: Garald Balding M.D.   On: 02/20/2015 22:25   Ct Renal Stone Study  02/20/2015  CLINICAL DATA:  Generalized abdominal pain, swelling, diarrhea. Bright red blood per rectum beginning yesterday. Severe anemia. EXAM: CT ABDOMEN AND PELVIS WITHOUT CONTRAST TECHNIQUE: Multidetector CT imaging of the abdomen and pelvis was performed following the standard protocol without IV contrast. COMPARISON:  03/11/2014 FINDINGS: Lower chest: No acute findings. Previously seen pericardial effusion is resolved. Hepatobiliary: Probable small hepatic cysts appears stable. No definite liver masses visualized on this unenhanced exam.  Gallbladder is unremarkable. Pancreas: No mass or inflammatory process identified on this un-enhanced exam. Spleen: Within normal limits in size. Adrenals/Urinary Tract: No evidence of urolithiasis or hydronephrosis. No definite mass visualized on this un-enhanced exam. Stomach/Bowel: No evidence of obstruction, inflammatory process, or abnormal fluid collections. Large colonic stool burden noted. Vascular/Lymphatic: No pathologically enlarged lymph nodes. 9 mm left common iliac lymph node is decreased in size from 15 mm on previous study. No evidence of abdominal aortic aneurysm. Reproductive: Prior hysterectomy noted. Adnexal regions are unremarkable in appearance. Other: None. Musculoskeletal:  No suspicious bone lesions identified. IMPRESSION: No acute findings identified within the abdomen or pelvis. Large stool burden noted; suggest clinical correlation for possible constipation. Electronically Signed  By: Earle Gell M.D.   On: 02/20/2015 23:44    Admission HPI: Patient is a 64 year old female with an extensive past medical history significant for GI bleed 2/2 AVM/hemorrhoids , scleroderma, DM type 2, chronic dCHF last echo in 12/2014 EF 60- 65%, PAH, GERD, hypothyroidism, CKD, and PAF; who presents with a recurrent GI bleed. Symptoms started this morning while patient reports having just grossly bloody bowel movements. Patient denies any NSAID use, hematemesis, fever. She reports associated symptoms of shortness of breath with exertion, chills, and some chest pain initially. 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   Upon arrival to the emergency department patient was noted to have a hemoglobin of 5.5 and gross blood per rectum. Gastroenterology was immediately consulted. ED physician talked with Dr. Jeralyn Bennett of gastroenterology who recommended general hydration while awaiting blood products.    Hospital Course by problem list: Active Problems:   DM type 2 with diabetic peripheral neuropathy (HCC)   Essential hypertension   SCLERODERMA   CKD (chronic kidney disease), stage III   Acute lower GI bleeding   GI bleed   Symptomatic anemia   Angiodysplasia of colon with hemorrhage   Acute GI Bleed from Colonic Angiodysplasia: Patient had initial Hgb of 5.5 that eventually increased to 9's after receiving 5U of pRBCs. Initially, Hgb was resistant to transfusions. Given large Hgb on UA with minimal RBCs in urine, a hemolytic anemia workup was conducted which was unremarkable. She had one additional episode on hematochezia during her hospitalization, which was likely the cause of her persistent anemia despite transfusions. An EGD was conducted which did not demonstrate any bleeding or AVMs. A colonoscopy, however, revealed AVMs in the left and right colon, and 10-15 AVMs were ablated in the left colon. From this procedure until discharge, she had no further bleeding episodes and her Hgb remained stable at her baseline 8-9. A vWF panel was obtained toward discharge under the premise that she may have developed acquired vWD in the setting of PAH. This was still pending at discharge.  Chronic Microcytic Anemia: Patient has long-standing iron deficiency anemia, receiving IV iron therapy from Dr. Lindi Adie from hematology/oncology. However, on admission she was noted to have large Hgb dipstick, but only 0-5 RBCs in urine. Hemolytic anemia workup negative. She received feraheme infusion on 12/13. Information was provided to her to make a follow-up appointment with Dr. Lindi Adie. Attempts were made to set [name] up with home CBC checks with [facility], to which she was resistant, even after I had explained to her the importance of checking her blood counts.  Orthostatic Hypotension: The patient had persistent hypotension after her colonoscopy. Reviewing old clinic notes, she routinely had SBPs in the 80s-90s.  However, she did have positive orthostatic vital signs and her torsemide was held. By day of discharge, after good oral intake, her orthostatic vital signs had normalized. IVF was not pursued due to her predilection for volume overload in the setting of her PAH. She was set up with home PT services.  Severe Pulmonary Arterial Hypertension with Cor Pulmonale: She was closely followed by the Heart Failure team, who also follow her as an outpatient, during her hospitalization. Selexipag was not held given the possibility of rebound PAH. Her other medications (adcirca, ambrisentan, home dosages) were added back. Torsemide 40 mg was given intermittently depending on her volume status. She was typically <140  lbs during her admission, far less than when she was volume overloaded at 147 lbs at previous clinic visits. She continued  Have SpO2 >95% on room air. She had follow-up with h  Chronic Kidney Disease Stage 3: Maintained baseline creatinine of ~1.7.  Scleroderma: Follows with Dr. Amil Amen per chart review. Not currently on steroids or medications for this. No signs on exam.  Hypothyroidism: TSH 3.3 in hospital. Continued 25 mcg Synthroid daily.  Peripheral Neuropathy: Reordered home pregabalin. Held amitriptyline given hypotension. Continued pregabalin 150 mg TID  Paroxysmal Atrial Fibrillation: Patient is on amiodarone 200 mg daily at home. Has been in NSR while in hospital. Continue amiodarone 200 mg daily  Discharge Vitals:   BP 86/36 mmHg  Pulse 60  Temp(Src) 98.7 F (37.1 C) (Oral)  Resp 14  Ht _0  (1.626 m)  Wt 138 lb 1.6 oz (62.642 kg)  BMI 23.69 kg/m2  SpO2 98%  Discharge Labs:  Results for orders placed or performed during the hospital encounter of 02/20/15 (from the past 24 hour(s))  Basic metabolic panel     Status: Abnormal   Collection Time: 02/26/15  4:41 AM  Result Value Ref Range   Sodium 135 135 - 145 mmol/L   Potassium 4.3 3.5 - 5.1 mmol/L   Chloride 104 101 - 111  mmol/L   CO2 23 22 - 32 mmol/L   Glucose, Bld 82 65 - 99 mg/dL   BUN 37 (H) 6 - 20 mg/dL   Creatinine, Ser 1.72 (H) 0.44 - 1.00 mg/dL   Calcium 8.7 (L) 8.9 - 10.3 mg/dL   GFR calc non Af Amer 30 (L) >60 mL/min   GFR calc Af Amer 35 (L) >60 mL/min   Anion gap 8 5 - 15  CBC     Status: Abnormal   Collection Time: 02/26/15  4:41 AM  Result Value Ref Range   WBC 7.4 4.0 - 10.5 K/uL   RBC 3.02 (L) 3.87 - 5.11 MIL/uL   Hemoglobin 8.5 (L) 12.0 - 15.0 g/dL   HCT 26.6 (L) 36.0 - 46.0 %   MCV 88.1 78.0 - 100.0 fL   MCH 28.1 26.0 - 34.0 pg   MCHC 32.0 30.0 - 36.0 g/dL   RDW 20.5 (H) 11.5 - 15.5 %   Platelets 124 (L) 150 - 400 K/uL    Signed: Liberty Handy, MD 02/26/2015, 2:30 PM    Services Ordered on Discharge: [place], Home PT (declined)

## 2015-02-26 NOTE — Consult Note (Signed)
[facility] CM Inpatient Consult   02/26/2015  VERENA SHAWGO Oct 07, 1950 [id number] Late entry 1247 pm: Referral received to assess for care management services. Explained that [facility] is a covered benefit of insurance.   Review information for [facility] Care Management with her about post hospital follow up.  She said she was not interested and was set up with [facility].  Explained that [facility] does not interfere with or replace any services arranged by the inpatient care management staff.  Patient declined services with [facility]. For questions, please contact: Natividad Brood, RN BSN [facility] Liaison  [phone] business mobile phone

## 2015-02-26 NOTE — Care Management Note (Signed)
Case Management Note  Patient Details  Name: [name] MRN: [id number] Date of Birth: [birth year]  Subjective/Objective:    CM following for progression and d/c planning.                Action/Plan: 02/25/2015 Met with pt re d/c plan, pt live with daughter, states that her sisters assist her. HH arranged per orders. [facility] will draw CBC for a short time.  Expected Discharge Date:      02/26/2015            Expected Discharge Plan:   Home with [facility] services  In-House Referral:  NA  Discharge planning Services  CM Consult  Post Acute Care Choice:  Home Health Choice offered to:  Patient  DME Arranged:  N/A DME Agency:  NA  HH Arranged:  RN, PT [place] Agency:  [place]  Status of Service:  Completed, signed off  Medicare Important Message Given:  Yes Date Medicare IM Given:    Medicare IM give by:    Date Additional Medicare IM Given:    Additional Medicare Important Message give by:     If discussed at [place] of Stay Meetings, dates discussed:    Additional Comments:  Adron Bene, RN 02/26/2015, 10:19 AM

## 2015-02-26 NOTE — Progress Notes (Signed)
Patient discharged home per MD. NSL x 2 discontinued with catheters intact. Discharge instructions reviewed. Teach back used to evaluate patient understanding to follow up with MDs outpatient and to pick up 2 prescriptions at pharmacy. Patient escorted out to [facility] exit via wheelchair and Product manager. Bartholomew Crews, RN

## 2015-02-26 NOTE — Progress Notes (Signed)
Subjective:  [name] denies any chest pain, dyspnea, light-headedness, or bleeding episodes. She says she is ready to go home today.  Objective: Vital signs in last 24 hours: Filed Vitals:   02/25/15 1413 02/25/15 1800 02/25/15 2124 02/26/15 0405  BP: _0 86/36  Pulse: 60 57 59 60  Temp: 98.3 F (36.8 C) 97 F (36.1 C) 98.5 F (36.9 C) 98.7 F (37.1 C)  TempSrc: Oral Oral Oral Oral  Resp: _1 Height:      Weight:   139 lb 12.8 oz (63.413 kg) 145 lb 4.8 oz (65.908 kg)  SpO2: 99% 96% 96% 97%   Weight change: 4 lb 3.5 oz (1.913 kg)  Intake/Output Summary (Last 24 hours) at 02/26/15 0755 Last data filed at 02/25/15 1700  Gross per 24 hour  Intake    717 ml  Output   1150 ml  Net   -433 ml   General: Vital signs reviewed.  Patient is in no acute distress and cooperative with exam. Cardiovascular: RRR, no m/r/g Pulmonary/Chest: Clear to ausculation bilaterally Abdominal: Soft, non-tender, non-distended, BS +, no guarding present.  Extremities: No lower extremity edema bilaterally Skin: Warm, dry and intact. Psychiatric: Normal mood and affect.   Lab Results: Basic Metabolic Panel:  Recent Labs Lab 02/25/15 0311 02/26/15 0441  NA 137 135  K 3.9 4.3  CL 104 104  CO2 25 23  GLUCOSE 96 82  BUN 37* 37*  CREATININE 1.55* 1.72*  CALCIUM 8.8* 8.7*   Liver Function Tests:  Recent Labs Lab 02/20/15 2111 02/22/15 1000  AST 22  --   ALT 11*  --   ALKPHOS 78  --   BILITOT 0.3 0.7  PROT 6.5  --   ALBUMIN 3.1*  --     Recent Labs Lab 02/20/15 2111  LIPASE 29   CBC:  Recent Labs Lab 02/25/15 0311 02/26/15 0441  WBC 8.0 7.4  HGB 9.1* 8.5*  HCT 28.6* 26.6*  MCV 89.4 88.1  PLT 132* 124*   Thyroid Function Tests:  Recent Labs Lab 02/21/15 1201  TSH 3.314   Coagulation:  Recent Labs Lab 02/22/15 1705  LABPROT 16.5*  INR 1.32   Anemia Panel:  Recent Labs Lab 02/22/15 1000  RETICCTPCT 4.9*   Urine Drug  Screen: Drugs of Abuse     Component Value Date/Time   LABOPIA POSITIVE* 10/02/2010 1128   COCAINSCRNUR NONE DETECTED 10/02/2010 1128   LABBENZ NONE DETECTED 10/02/2010 1128   AMPHETMU NONE DETECTED 10/02/2010 1128   THCU NONE DETECTED 10/02/2010 1128   LABBARB NONE DETECTED 10/02/2010 1128    Urinalysis:  Recent Labs Lab 02/20/15 2145 02/23/15 0843  COLORURINE YELLOW YELLOW  LABSPEC 1.007 1.014  PHURINE 7.0 6.0  GLUCOSEU NEGATIVE NEGATIVE  HGBUR LARGE* LARGE*  BILIRUBINUR NEGATIVE NEGATIVE  KETONESUR NEGATIVE NEGATIVE  PROTEINUR NEGATIVE NEGATIVE  NITRITE NEGATIVE NEGATIVE  LEUKOCYTESUR TRACE* SMALL*   Medications:  I have reviewed the patient's current medications. Prior to Admission:  Prescriptions prior to admission  Medication Sig Dispense Refill Last Dose  . ADCIRCA 20 MG TABS TAKE 2 TABLETS (40MG) BY MOUTH ONCE DAILY WITH OR WITHOUT FOOD. CALL [phone] FOR REFILLS. 60 tablet 11 02/20/2015 at Unknown time  . ambrisentan (LETAIRIS) 10 MG tablet Take 1 tablet (10 mg total) by mouth daily. 30 tablet 11 02/20/2015 at Unknown time  . amiodarone (PACERONE) 200 MG tablet Take 1 tablet (200 mg total) by mouth daily. 30 tablet 6 02/20/2015 at  Unknown time  . esomeprazole (NEXIUM) 40 MG capsule Take 40 mg by mouth 2 (two) times daily.    02/20/2015 at Unknown time  . fenofibrate 54 MG tablet Take 1 tablet (54 mg total) by mouth daily. 30 tablet 12 02/20/2015 at Unknown time  . FLUoxetine (PROZAC) 20 MG capsule take 1 capsule by mouth every morning 30 capsule 2 02/20/2015 at Unknown time  . HYDROcodone-acetaminophen (NORCO) 10-325 MG per tablet Take 1 tablet by mouth every 6 (six) hours as needed for moderate pain.    PRN  . levothyroxine (SYNTHROID, LEVOTHROID) 25 MCG tablet Take 1 tablet (25 mcg total) by mouth daily before breakfast. 30 tablet 11 02/20/2015 at Unknown time  . magnesium oxide (MAG-OX) 400 MG tablet Take 400 mg by mouth 2 (two) times daily.   02/20/2015 at Unknown  time  . metolazone (ZAROXOLYN) 2.5 MG tablet Take 1 tablet (2.5 mg total) by mouth once a week. On Fridays 10 tablet 3 02/14/2015  . nortriptyline (PAMELOR) 10 MG capsule Take 20 mg by mouth at bedtime.   02/19/2015 at Unknown time  . potassium chloride SA (K-DUR,KLOR-CON) 20 MEQ tablet Take 20 mEq by mouth daily.   02/20/2015 at Unknown time  . pregabalin (LYRICA) 150 MG capsule Take 1 capsule (150 mg total) by mouth 3 (three) times daily. 90 capsule 5 02/20/2015 at Unknown time  . Selexipag (UPTRAVI) 1600 MCG TABS Take 1,600 mcg by mouth 2 (two) times daily. 60 tablet 3 02/20/2015 at Unknown time  . torsemide (DEMADEX) 20 MG tablet Take 3 tablets (60 mg total) by mouth 2 (two) times daily. 180 tablet 3 02/20/2015 at Unknown time   Scheduled Meds: . ambrisentan  10 mg Oral Daily  . amiodarone  200 mg Oral Daily  . levothyroxine  25 mcg Oral QAC breakfast  . potassium chloride  20 mEq Oral Daily  . pregabalin  150 mg Oral TID  . Selexipag  1,600 mcg Oral BID  . sodium chloride  3 mL Intravenous Q12H  . Tadalafil (PAH)  40 mg Oral Daily   Continuous Infusions:  PRN Meds:.albuterol, HYDROcodone-acetaminophen Assessment/Plan: Active Problems:   DM type 2 with diabetic peripheral neuropathy (HCC)   Essential hypertension   SCLERODERMA   CKD (chronic kidney disease), stage III   Acute lower GI bleeding   GI bleed   Symptomatic anemia   Angiodysplasia of colon with hemorrhage  Acute GI Bleed: No additional bleeding episodes s/p ablation in left colon. 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 - vWF pending  Chronic Microcytic Anemia: Patient has long-standing iron deficiency anemia, receiving IV iron therapy from [name]. However, on admission she was noted to have large Hgb dipstick, but only 0-5 RBCs in urine. Hemolytic anemia workup negative. Received feraheme infusion yesterday. Hgb 8.5 today. -  Home health for weekly CBC checks - Follow-up with [name] for iron transfusion  Orthostatic Hypotension: SBP is the 86/36 this morning and creatinine continues to climb to the 1.7's today, which is her normal baseline. Her weight today is up to 145, from the mid 130s yesterday. Repeat orthostats today are normal. She is  - Hold Torsemide, encourage po intake. 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 Home regimen includestorsemide 60 mg BID, metolazone weekly, ambrisentan 10 mg, selexipag 1600 mcg bid, adcirca 40 mg daily.  She denies shortness of breath. No LEE b/l.  -Cardiology following, appreciate recommendations -Selexipag 1600 mcg QD -Ambrisentan 10 mg daily -Adcirca 40 mg daily - Restarted torsemide 40 mg daily - Metalozone changed to as needed - Follow up with Heart Failure Clinic  Chronic Kidney Disease: Baseline creatinine appears to be around 1.7. Creatinine this morning 1.7  Scleroderma: Follows with Dr. Amil [name] per chart review. Not currently on steroids or medications for this. No signs on exam. -Outpatient follow up  Hypothyroidism: TSH 3.3 in hospital.  -Continue 25 mcg Synthroid daily  Peripheral Neuropathy: Reordered home pregabalin. Holding amitriptyline given hypotension. -Continue pregabalin 150 mg TID  Paroxysmal Atrial Fibrillation: Patient is on amiodarone 200 mg daily at home. Has been in NSR. -Continue amiodarone 200 mg daily  DVT/PE ppx: SCDs FEN: NPO Code: FULL  Dispo: Disposition is deferred at this time, awaiting improvement of current medical problems.  Anticipated discharge in approximately 1-2 day(s).   The patient does have a current PCP Pamela Falcon, MD) and does need an [facility] hospital follow-up appointment after discharge.  The patient does not have transportation limitations that hinder transportation to clinic appointments.   LOS: 6  days   Liberty Handy, MD PGY-1 Internal Medicine Resident Pager # [phone]  02/26/2015 7:55 AM

## 2015-02-26 NOTE — Evaluation (Signed)
Physical Therapy Evaluation Patient Details Name: [name] MRN: [id number] DOB: [birth year] Today's Date: 02/26/2015   History of Present Illness  Pt is a 64 yo female who was admitted with anemia and GI bleed. PMH: scleroderm/systemic scleorsis, sever pulmonary htn/cor pulmonale, DM 2, CKD, hypothyroidism, and PAF with RVR.  Clinical Impression  Pt admitted for above. Pt presenting with generalized deconditioning and mild instability. Anticipate pt to be safe for d/c home with assist from family. Recommend RW to increased stability with ambulation however pt reporting "i've been unsteady for awhile" Recommend HHPT. Pt reports she's had it a few months ago as well.    Follow Up Recommendations Home health PT;Supervision/Assistance - 24 hour    Equipment Recommendations  None recommended by PT    Recommendations for Other Services       Precautions / Restrictions Precautions Precautions: Fall Precaution Comments: HGB trending down, from 9.8-8.5 over last 3 days - RN notified Restrictions Weight Bearing Restrictions: No      Mobility  Bed Mobility Overal bed mobility: Needs Assistance Bed Mobility: Supine to Sit     Supine to sit: Min guard;HOB elevated     General bed mobility comments: supervision for safety due to previous dizziness with mobility  Transfers Overall transfer level: Needs assistance Equipment used: 1 person hand held assist Transfers: Sit to/from Stand Sit to Stand: Min assist         General transfer comment: min guard to steady patient  Ambulation/Gait Ambulation/Gait assistance: Min guard Ambulation Distance (Feet): 200 Feet Assistive device: None Gait Pattern/deviations: Staggering left;Staggering right;Narrow base of support   Gait velocity interpretation:  (guarded/cautious) General Gait Details: pt mildly unsteady but no overt episodes of LOB. pt staggering L/R. recommended RW pt reports "i just hold onto stuff in my house, but I  have one i can use if I need it."  Stairs            Wheelchair Mobility    Modified Rankin (Stroke Patients Only)       Balance Overall balance assessment: Needs assistance Sitting-balance support: Feet supported Sitting balance-Leahy Scale: Good     Standing balance support: No upper extremity supported;During functional activity Standing balance-Leahy Scale: Good Standing balance comment: pt stood at sink to wash hands and face, didn't lean up against counter                             Pertinent Vitals/Pain Pain Assessment: No/denies pain    Home Living Family/patient expects to be discharged to:: Private residence Living Arrangements:  (granddaughter) Available Help at Discharge: Family;Available PRN/intermittently (however reports her twin sister can help) Type of Home: Apartment Home Access: Level entry     Home Layout: One level Home Equipment: Walker - 2 wheels;Cane - single point;Grab bars - tub/shower Additional Comments: daughter goes to school    Prior Function Level of Independence: Independent         Comments: pt was doing cooking, cleaning, grocery shopping     Hand Dominance   Dominant Hand: Right    Extremity/Trunk Assessment   Upper Extremity Assessment: Overall WFL for tasks assessed           Lower Extremity Assessment: Overall WFL for tasks assessed      Cervical / Trunk Assessment: Normal  Communication   Communication: No difficulties  Cognition Arousal/Alertness: Awake/alert Behavior During Therapy: WFL for tasks assessed/performed Overall Cognitive Status: Within Functional Limits for  tasks assessed                      General Comments General comments (skin integrity, edema, etc.): assist pt to [facility] with min guard. pt able to perform hygiene without difficulty with supervision    Exercises        Assessment/Plan    PT Assessment Patient needs continued PT services  PT Diagnosis  Generalized weakness;Difficulty walking   PT Problem List Decreased strength;Decreased range of motion;Decreased activity tolerance;Decreased balance;Decreased mobility  PT Treatment Interventions DME instruction;Gait training;Stair training;Functional mobility training;Therapeutic activities;Therapeutic exercise;Balance training;Neuromuscular re-education   PT Goals (Current goals can be found in the Care Plan section) Acute Rehab PT Goals Patient Stated Goal: home PT Goal Formulation: With patient Time For Goal Achievement: 03/05/15 Potential to Achieve Goals: Good    Frequency Min 3X/week   Barriers to discharge Decreased caregiver support unsure if pt truly has 24/7 as granddaughter goes to HS during the day. reports she has sisters that can help during the day but also reports some work    Co-evaluation               End of Session Equipment Utilized During Treatment: Gait belt Activity Tolerance: Patient tolerated treatment well Patient left: in chair;with call bell/phone within reach Nurse Communication: Mobility status (treding down HGB)         Time: 0630-1601 PT Time Calculation (min) (ACUTE ONLY): 25 min   Charges:   PT Evaluation $Initial PT Evaluation Tier I: 1 Procedure PT Treatments $Gait Training: 8-22 mins   PT G CodesKingsley Callander 02/26/2015, 8:20 AM   Kittie Plater, PT, DPT Pager #: [phone] Office #: [phone]

## 2015-02-27 LAB — VON WILLEBRAND PANEL
Coagulation Factor VIII: 227 % — ABNORMAL HIGH (ref 57–163)
Ristocetin Co-factor, Plasma: 172 % (ref 50–200)
VON WILLEBRAND ANTIGEN, PLASMA: 231 % — AB (ref 50–200)

## 2015-02-27 LAB — COAG STUDIES INTERP REPORT: PDF IMAGE: 0

## 2015-02-28 DIAGNOSIS — I480 Paroxysmal atrial fibrillation: Secondary | ICD-10-CM | POA: Diagnosis not present

## 2015-02-28 DIAGNOSIS — K922 Gastrointestinal hemorrhage, unspecified: Secondary | ICD-10-CM | POA: Diagnosis not present

## 2015-02-28 DIAGNOSIS — E669 Obesity, unspecified: Secondary | ICD-10-CM | POA: Diagnosis not present

## 2015-02-28 DIAGNOSIS — D573 Sickle-cell trait: Secondary | ICD-10-CM | POA: Diagnosis not present

## 2015-02-28 DIAGNOSIS — I272 Other secondary pulmonary hypertension: Secondary | ICD-10-CM | POA: Diagnosis not present

## 2015-02-28 DIAGNOSIS — K219 Gastro-esophageal reflux disease without esophagitis: Secondary | ICD-10-CM | POA: Diagnosis not present

## 2015-02-28 DIAGNOSIS — E1140 Type 2 diabetes mellitus with diabetic neuropathy, unspecified: Secondary | ICD-10-CM | POA: Diagnosis not present

## 2015-02-28 DIAGNOSIS — D649 Anemia, unspecified: Secondary | ICD-10-CM | POA: Diagnosis not present

## 2015-02-28 DIAGNOSIS — E1121 Type 2 diabetes mellitus with diabetic nephropathy: Secondary | ICD-10-CM | POA: Diagnosis not present

## 2015-02-28 DIAGNOSIS — N189 Chronic kidney disease, unspecified: Secondary | ICD-10-CM | POA: Diagnosis not present

## 2015-02-28 DIAGNOSIS — K5521 Angiodysplasia of colon with hemorrhage: Secondary | ICD-10-CM | POA: Diagnosis not present

## 2015-02-28 DIAGNOSIS — E1151 Type 2 diabetes mellitus with diabetic peripheral angiopathy without gangrene: Secondary | ICD-10-CM | POA: Diagnosis not present

## 2015-02-28 DIAGNOSIS — E039 Hypothyroidism, unspecified: Secondary | ICD-10-CM | POA: Diagnosis not present

## 2015-02-28 DIAGNOSIS — I5032 Chronic diastolic (congestive) heart failure: Secondary | ICD-10-CM | POA: Diagnosis not present

## 2015-03-03 ENCOUNTER — Telehealth: Payer: Self-pay

## 2015-03-03 NOTE — Telephone Encounter (Signed)
Agree with plan to draw CBC.  Thanks.

## 2015-03-03 NOTE — Telephone Encounter (Signed)
rec'd call from [facility] Rn outside of pt home for ordered CBC lab draw.  RN questioning doing CBC today since pt scheduled to be seen for labs tomorrow with cardiology.  PCP not immediately available to ask and [facility] RN stating cardiology is to check BMP tomorrow I advised RN to go ahead with CBC today as ordered since I am unsure of policy on adding orders to another office etc.   FYI

## 2015-03-04 ENCOUNTER — Ambulatory Visit (HOSPITAL_COMMUNITY)
Admission: RE | Admit: 2015-03-04 | Discharge: 2015-03-04 | Disposition: A | Discharge status: Home / Self Care | Payer: Medicare Other | Source: Ambulatory Visit | Attending: Cardiology | Admitting: Cardiology

## 2015-03-04 VITALS — BP 86/42 | HR 67 | Wt 147.0 lb

## 2015-03-04 DIAGNOSIS — N184 Chronic kidney disease, stage 4 (severe): Secondary | ICD-10-CM | POA: Diagnosis not present

## 2015-03-04 DIAGNOSIS — I5032 Chronic diastolic (congestive) heart failure: Secondary | ICD-10-CM | POA: Insufficient documentation

## 2015-03-04 DIAGNOSIS — K219 Gastro-esophageal reflux disease without esophagitis: Secondary | ICD-10-CM | POA: Insufficient documentation

## 2015-03-04 DIAGNOSIS — I313 Pericardial effusion (noninflammatory): Secondary | ICD-10-CM | POA: Insufficient documentation

## 2015-03-04 DIAGNOSIS — M1990 Unspecified osteoarthritis, unspecified site: Secondary | ICD-10-CM | POA: Diagnosis not present

## 2015-03-04 DIAGNOSIS — K3184 Gastroparesis: Secondary | ICD-10-CM | POA: Diagnosis not present

## 2015-03-04 DIAGNOSIS — M349 Systemic sclerosis, unspecified: Secondary | ICD-10-CM | POA: Insufficient documentation

## 2015-03-04 DIAGNOSIS — I480 Paroxysmal atrial fibrillation: Secondary | ICD-10-CM | POA: Insufficient documentation

## 2015-03-04 DIAGNOSIS — M79606 Pain in leg, unspecified: Secondary | ICD-10-CM | POA: Diagnosis not present

## 2015-03-04 DIAGNOSIS — E1140 Type 2 diabetes mellitus with diabetic neuropathy, unspecified: Secondary | ICD-10-CM | POA: Diagnosis not present

## 2015-03-04 DIAGNOSIS — Z79899 Other long term (current) drug therapy: Secondary | ICD-10-CM | POA: Insufficient documentation

## 2015-03-04 DIAGNOSIS — Q2733 Arteriovenous malformation of digestive system vessel: Secondary | ICD-10-CM | POA: Diagnosis not present

## 2015-03-04 DIAGNOSIS — D509 Iron deficiency anemia, unspecified: Secondary | ICD-10-CM | POA: Insufficient documentation

## 2015-03-04 DIAGNOSIS — I2781 Cor pulmonale (chronic): Secondary | ICD-10-CM | POA: Insufficient documentation

## 2015-03-04 DIAGNOSIS — K31811 Angiodysplasia of stomach and duodenum with bleeding: Secondary | ICD-10-CM | POA: Insufficient documentation

## 2015-03-04 DIAGNOSIS — N189 Chronic kidney disease, unspecified: Secondary | ICD-10-CM | POA: Diagnosis not present

## 2015-03-04 DIAGNOSIS — I2721 Secondary pulmonary arterial hypertension: Secondary | ICD-10-CM

## 2015-03-04 DIAGNOSIS — I272 Other secondary pulmonary hypertension: Secondary | ICD-10-CM | POA: Diagnosis not present

## 2015-03-04 DIAGNOSIS — E1151 Type 2 diabetes mellitus with diabetic peripheral angiopathy without gangrene: Secondary | ICD-10-CM | POA: Diagnosis not present

## 2015-03-04 DIAGNOSIS — E785 Hyperlipidemia, unspecified: Secondary | ICD-10-CM | POA: Diagnosis not present

## 2015-03-04 DIAGNOSIS — K5521 Angiodysplasia of colon with hemorrhage: Secondary | ICD-10-CM | POA: Diagnosis not present

## 2015-03-04 DIAGNOSIS — D573 Sickle-cell trait: Secondary | ICD-10-CM | POA: Insufficient documentation

## 2015-03-04 DIAGNOSIS — E1121 Type 2 diabetes mellitus with diabetic nephropathy: Secondary | ICD-10-CM | POA: Diagnosis not present

## 2015-03-04 DIAGNOSIS — E039 Hypothyroidism, unspecified: Secondary | ICD-10-CM | POA: Diagnosis not present

## 2015-03-04 DIAGNOSIS — K31819 Angiodysplasia of stomach and duodenum without bleeding: Secondary | ICD-10-CM

## 2015-03-04 DIAGNOSIS — I129 Hypertensive chronic kidney disease with stage 1 through stage 4 chronic kidney disease, or unspecified chronic kidney disease: Secondary | ICD-10-CM | POA: Diagnosis not present

## 2015-03-04 DIAGNOSIS — D649 Anemia, unspecified: Secondary | ICD-10-CM | POA: Diagnosis not present

## 2015-03-04 DIAGNOSIS — E669 Obesity, unspecified: Secondary | ICD-10-CM | POA: Diagnosis not present

## 2015-03-04 DIAGNOSIS — E119 Type 2 diabetes mellitus without complications: Secondary | ICD-10-CM | POA: Diagnosis not present

## 2015-03-04 DIAGNOSIS — K922 Gastrointestinal hemorrhage, unspecified: Secondary | ICD-10-CM | POA: Diagnosis not present

## 2015-03-04 LAB — CBC WITH DIFFERENTIAL/PLATELET
BASOS PCT: 0 %
Basophils Absolute: 0 10*3/uL (ref 0.0–0.1)
EOS ABS: 0.1 10*3/uL (ref 0.0–0.7)
EOS PCT: 1 %
HCT: 26.7 % — ABNORMAL LOW (ref 36.0–46.0)
Hemoglobin: 8.6 g/dL — ABNORMAL LOW (ref 12.0–15.0)
LYMPHS PCT: 17 %
Lymphs Abs: 1.2 10*3/uL (ref 0.7–4.0)
MCH: 27.9 pg (ref 26.0–34.0)
MCHC: 32.2 g/dL (ref 30.0–36.0)
MCV: 86.7 fL (ref 78.0–100.0)
MONO ABS: 0.9 10*3/uL (ref 0.1–1.0)
MONOS PCT: 12 %
NEUTROS ABS: 5 10*3/uL (ref 1.7–7.7)
NEUTROS PCT: 71 %
PLATELETS: 182 10*3/uL (ref 150–400)
RBC: 3.08 MIL/uL — ABNORMAL LOW (ref 3.87–5.11)
RDW: 18.9 % — AB (ref 11.5–15.5)
WBC: 7.1 10*3/uL (ref 4.0–10.5)

## 2015-03-04 LAB — BASIC METABOLIC PANEL
Anion gap: 14 (ref 5–15)
BUN: 66 mg/dL — ABNORMAL HIGH (ref 6–20)
CALCIUM: 8.4 mg/dL — AB (ref 8.9–10.3)
CO2: 24 mmol/L (ref 22–32)
CREATININE: 2.4 mg/dL — AB (ref 0.44–1.00)
Chloride: 100 mmol/L — ABNORMAL LOW (ref 101–111)
GFR, EST AFRICAN AMERICAN: 24 mL/min — AB (ref >60)
GFR, EST NON AFRICAN AMERICAN: 20 mL/min — AB (ref >60)
Glucose, Bld: 71 mg/dL (ref 65–99)
Potassium: 4.2 mmol/L (ref 3.5–5.1)
SODIUM: 138 mmol/L (ref 135–145)

## 2015-03-04 MED ORDER — POTASSIUM CHLORIDE CRYS ER 10 MEQ PO TBCR: 20.0000 meq | EXTENDED_RELEASE_TABLET | Freq: Every day | ORAL | Status: DC

## 2015-03-04 MED ORDER — METOLAZONE 2.5 MG PO TABS: 2.5000 mg | ORAL_TABLET | ORAL | Status: DC

## 2015-03-04 MED ORDER — TORSEMIDE 20 MG PO TABS: 40.0000 mg | ORAL_TABLET | Freq: Two times a day (BID) | ORAL | Status: DC

## 2015-03-04 MED ORDER — POTASSIUM CHLORIDE CRYS ER 20 MEQ PO TBCR: 20.0000 meq | EXTENDED_RELEASE_TABLET | Freq: Every day | ORAL | Status: DC

## 2015-03-04 NOTE — Progress Notes (Signed)
Advanced Heart Failure Medication Review by a Pharmacist  Does the patient  feel that his/her medications are working for him/her?  yes  Has the patient been experiencing any side effects to the medications prescribed?  no  Does the patient measure his/her own blood pressure or blood glucose at home?  no   Does the patient have any problems obtaining medications due to transportation or finances?   no  Understanding of regimen: good Understanding of indications: good Potential of compliance: good Patient understands to avoid NSAIDs. Patient understands to avoid decongestants.  Issues to address at subsequent visits: None   Pharmacist comments:  [name] is a pleasant 64 yo F presenting with her daughter and a current medication list. She reports only taking one 42mq potassium tablet a day if she is able to swallow it but has a lot of difficulty with this. She also reports only taking torsemide 60 mg daily instead of the 40 mg BID she is supposed to be taking. She did not have any specific medication-related questions or concerns for me at this time.   ERuta Hinds NVelva Harman PharmD, BCPS, CPP Clinical Pharmacist Pager: [phone]: [phone]/20/2016 2:47 PM      Time with patient: 8 minutes  Preparation and documentation time: 2 minutes Total time: 10 minutes

## 2015-03-04 NOTE — Patient Instructions (Signed)
CHANGE Torsemide to 40 mg (2 tabs), twice a day  CHANGE Metolazone to 2.5 mg, weekly on Friday only for weights greater than 144 lbs  When you take metolazone be sure to take 20 meq of potassium as well  Your physician recommends that you schedule a follow-up appointment in: 3 weeks with Dr.McLean  Do the following things EVERYDAY: 1) Weigh yourself in the morning before breakfast. Write it down and keep it in a log. 2) Take your medicines as prescribed 3) Eat low salt foods-Limit salt (sodium) to 2000 mg per day.  4) Stay as active as you can everyday 5) Limit all fluids for the day to less than 2 liters 6)

## 2015-03-04 NOTE — Progress Notes (Signed)
Patient ID: [name], female   DOB: [birth year], 64 y.o.   MRN: [id number] P  Advanced Heart Failure Clinic Note   Patient ID: [name], female   DOB: 26-Apr-1950, 64 y.o.   MRN: [id number] Pulmonary: Dr Lamonte Sakai  HF: [place]  HPI: [name] is a 64 yo female with a history of DM2, HLD, HTN, pHTN, GERD, gastroparesis, scleroderma, DJD and diastolic HF.   Of note she has been on several PAH targeted therapies and coumadin over last 9 years, but has not been reliably on therapy due to noncompliance and inability to have labs performed. She has had best response to Tyvaso (+ bosentan) with an improvement in 6 minute walk and in PASP from 102 mmHg (7/09) to 42 mmHg (9/10). After stopping Tyvaso her PASP rose to 80's by TTE 6/13 and 11/14. Surprisingly, she has never required supplemental O2. Macitentan was started in 11/14. She developed nausea and diarrhea and macitentan was stopped early 3/15. Most recently, she had been on Letairis and Adcirca (followed by Dr. Lamonte Sakai).   Admitted 10/14-10/18/15 for A/C RHF, volume overload and syncope. Diuresed with IV lasix and milrinone a total of 26 lbs. Discharge weight was 156 lbs and started on lasix 40 mg BID.   Admitted 12/29 with dyspnea and chest pain. BP was low, and Lasix and Adcirca were both initially held. Echo showed EF 60-65% with moderately dilated and dysfunctional RV, PASP 111 mmHg with moderate TR. There was a large pericardial effusion without definite tamponade. Presentation was suspected to be due to progressive PAH with secondary RV failure rather than tamponade. No pericardiocentesis yet. She was started on milrinone gtt and diuresed. Shorter-acting sildenafil was started instead of Adcirca. Also had GI bleed. 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 Discharge weight was 146 pounds.   She was admitted in 2/16 with acute on chronic diastolic CHF/RV failure and hypotension.  Echo (2/16) showed  EF 60%, moderate-severe RV dilation with moderate-severe RV systolic dysfunction, PA systolic pressure 62 mmHg, large pericardial effusion with borderline tamponade.  She had pericardiocentesis with improvement in symptoms.  No malignant cells in pericardial fluid.  Repeat echo showed no pericardial effusion.  She developed paroxysmal atrial fibrillation on 2/19 but went back into NSR on amiodarone.  She was not anticoagulated given history of GI bleeding and pericardial effusion.   She was again admitted in 10/16.  She had been out of meds x 1 week and gained 20 lbs.  She also was noted to be severely anemic with iron deficiency.  She was heme negative.  She got 2 units PRBCs.  She was diuresed extensively.  She was started on Levoxyl for hypothyroidism.   Admitted In December 2016  with GI bleed. Multiple AVMs noted with 10-15 ablations completed. PAH meds continued. Diuretics cut back --torsemide 40 mg daily. Discharge weight was 138  pounds.   She returns today for post hospital follow up.  Taking torsemide 60 mg daily and potassium once a day.  Mild dyspnea exertion. Complaining of fatigue. Denies PND/Orthopnea/syncope/presyncope.  Weight at home trending up to 144 pounds. No BRBPR. AHC following.   6 min walk (05/16/14): 125 m 6 min walk (6/16): 146 m 6 min walk (8/16): 183 m  Echo (1/16) with EF 55-60%, severely dilated RA with moderately decreased systolic function, large pericardial effusion.  ECHO 05/09/2014: EF 60-65% Peak PA pressure 52  ECHO 06/05/14: EF 55-60%, RV moderately dilated with moderately decreased  systolic function with D-shaped interventricular septum. Severe RAE. RVSP 59. No pericardial effusion. Echo (8/16): EF 60-65%, moderate-severe RV dilation with mildly decreased RV systolic function, D-shaped interventricular septum, PASP 92, severe biatrial enlargement, no pericardial effusion.  Echo (10/16): EF 60-65%, D-shaped septum, RV moderately dilated, PASP 76 mmHg, severe TR.    ABIs (11/16): Normal  RHC (1/16):  Hemodynamics (mmHg) RA mean 12 RV 64/14 PA 68/29, mean 42 PCWP mean 12 LV 102/14 AO 105/53 PA 67% AO 93% Cardiac Output (Fick) 6.63  Cardiac Index (Fick) 3.88  PVR 4.5 WU  Labs 03/24/2014 K 3.8 Creatinine 1.51 Hgb 9.3  Labs 2/16 BNP 121, K 4.4, creatinine 1.27, hgb 9.2 Labs 2/16 K 3.8, creatinine 1.58, AST 45, ALT 12 Labs 05/22/2014: Creatinine 2.1 K 3.7  Labs 06/05/2014: Creatinine 1.5 K 3.7, LFTs normal Labs 6/16: K 4.4, creatinine 1.82, hgb 8.8 Labs 10/16: K 4.4, creatinine 1.77 => 1.64, hgb 7.9 => 8.4, LFTs normal, Mg 1.6 01/21/2015: K 3.8 Creatinine 1.67  02/26/2015: K 4.3 Creatinine 1.72 Hgb 8.5    ROS: All systems negative except as listed in HPI, PMH and Problem List.  SH:  Social History   Social History  . Marital Status: Legally Separated    Spouse Name: N/A  . Number of Children: 2  . Years of Education: 11   Occupational History  . Umemployed   . Disability     Social History Main Topics  . Smoking status: Never Smoker   . Smokeless tobacco: Never Used  . Alcohol Use: No  . Drug Use: No  . Sexual Activity: No   Other Topics Concern  . Not on file   Social History Narrative    FAMILY HISTORY:  Significant for coronary artery disease and diabetes   Patient lives at home with granddaughter.    Patient has 2 children.    Patient has 11 years of education.    Patient is on disability.    Patient is right handed.    Patient is separated.      FH:  Family History  Problem Relation Age of Onset  . Heart disease Mother   . Diabetes Mother   . Diabetes Sister     Past Medical History  Diagnosis Date  . Secondary pulmonary hypertension ([place])     a. 2/2 scleroderma  . GERD (gastroesophageal reflux disease)   . Systemic sclerosis ([place])   . Unspecified essential hypertension   . Gastroparesis   . Sickle cell trait ([place])   . Obesity   . Scleroderma ([place])   . Trichomonas   . Peripheral neuropathy ([place])    . Type II diabetes mellitus ([facility])     a. diet control   . Arthritis   . Chronic kidney disease   . Gout   . Hypothyroidism     a. may be due to amiodarone use. started on synthroid on 12/2014 admission   . Anemia     a.  hx of GI bleed and duodenal AVMs  . Chronic diastolic CHF (congestive heart failure) ([place])     a. Echo 12/23/14 withEF 60-65%, moderately dilated RV, PASP 72, no pericardial effusion.  Marland Kitchen PAF (paroxysmal atrial fibrillation) (H[facility])     a. not a long term AC candidate due to GI bleeds  . Angiodysplasia of colon with hemorrhage     Current Outpatient Prescriptions  Medication Sig Dispense Refill  . ADCIRCA 20 MG TABS TAKE 2 TABLETS (40MG) BY MOUTH ONCE DAILY WITH OR WITHOUT  FOOD. CALL [phone] FOR REFILLS. 60 tablet 11  . ambrisentan (LETAIRIS) 10 MG tablet Take 1 tablet (10 mg total) by mouth daily. 30 tablet 11  . amiodarone (PACERONE) 200 MG tablet Take 1 tablet (200 mg total) by mouth daily. 30 tablet 6  . esomeprazole (NEXIUM) 40 MG capsule Take 40 mg by mouth 2 (two) times daily.     . fenofibrate 54 MG tablet Take 1 tablet (54 mg total) by mouth daily. 30 tablet 12  . FLUoxetine (PROZAC) 20 MG capsule take 1 capsule by mouth every morning 30 capsule 2  . HYDROcodone-acetaminophen (NORCO) 10-325 MG per tablet Take 1 tablet by mouth every 6 (six) hours as needed for moderate pain.     Marland Kitchen levothyroxine (SYNTHROID, LEVOTHROID) 25 MCG tablet Take 1 tablet (25 mcg total) by mouth daily before breakfast. 30 tablet 11  . metolazone (ZAROXOLYN) 2.5 MG tablet Take 2.5 mg by mouth once a week. Every Friday    . nortriptyline (PAMELOR) 10 MG capsule Take 20 mg by mouth at bedtime.    . potassium chloride SA (K-DUR,KLOR-CON) 20 MEQ tablet Take 20 mEq by mouth daily.    . pregabalin (LYRICA) 150 MG capsule Take 1 capsule (150 mg total) by mouth 3 (three) times daily. 90 capsule 5  . Selexipag (UPTRAVI) 1600 MCG TABS Take 1,600 mcg by mouth 2 (two) times daily. 60 tablet 3   . torsemide (DEMADEX) 20 MG tablet Take 60 mg by mouth daily.     No current facility-administered medications for this encounter.    Filed Vitals:   03/04/15 1411  BP: 86/42  Pulse: 67  Weight: 147 lb (66.679 kg)  SpO2: 96%    PHYSICAL EXAM: General: Elderly and fatigued appearing. Sister present.  Neck: JVP 9-10 cm. No o thyromegaly or thyroid nodule. Lungs: diminished, bilateral crackles in bases. CV: RV heave. Heart regular S1/S2 with widely split S2,  2/6 HSM LLSB. R and LLE 1+ edema.   Unable to palpate pedal pulses.  Abdomen: no hepatosplenomegaly. Nontender, mildly distended. Neurologic: Alert and oriented x 3.  Psych: Affect flat. Extremities: No clubbing or cyanosis.   ASSESSMENT & PLAN: 1. 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  Echo 10/16  EF 60-65% with moderate dilated and mildly dysfunctional RV, severe TR. Pericardial effusion has resolved.   NYHA class III symptoms.   Volume status mildly elevated. Increase torsemide to 40 mg twice a day. I have asked her to take metolazone 2.5 mg for weight 144 or greater on Fridays with an extra 20 meq of potassium.  BMET today.  2. PAH with cor pulmonale: Severe PAH.  Had last [place] in 1/16. Pulmonary pressure remains elevated by echo in 10/16. - Continue ambrisentan 10 mg daily.  - Continue selexipag 1600 mcg bid.  - Continue Adcirca 40 mg daily.  3. CKD III: Check BMET today.  4. 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 She is no longer anticoagulated. Check CBC today.  5. Pericardial effusion: Resolved after pericardiocentesis, not seen on echo 12/2014.   6. Atrial fibrillation: Regular rhythm. Paroxysmal.  Continue amiodarone. She is not anticoagulated due to h/o GI bleeding. LFTs normal in 10/16.  Recently started on Levoxyl, repeat TSH in 12/16. She will need a yearly eye exam while on amiodarone. 7.  Hypothyroidism: May be related to amiodarone.  She is now on  Levoxyl 25 mcg daily.  Recent TSH 3.3 .  8. Anemia: Fe-deficiency.  Recent GI bleed 02/2014. Check CBC today.  9. Leg pain: Etiology uncertain.  ?Neuropathy.  She is on Lyrica and Vicodin.  Arterial dopplers ok. Continue mag 400 mg daily.    Follow up in 3 weeks with Dr Aundra Dubin.   Amy Clegg NP-C  03/04/2015 2:16 PM  Addendum: CBC and BMET today. Hemoglobin stable 8.6 Creatinine elevated from 1.7>2.4. She is bring called and instructed to hold diuretics for 2 days then cut back torsemide to 40 mg daily. I have also asked her not to take metolazone.   Amy Clegg NP-C  4:37 PM

## 2015-03-05 DIAGNOSIS — N189 Chronic kidney disease, unspecified: Secondary | ICD-10-CM | POA: Diagnosis not present

## 2015-03-05 DIAGNOSIS — D649 Anemia, unspecified: Secondary | ICD-10-CM | POA: Diagnosis not present

## 2015-03-05 DIAGNOSIS — K219 Gastro-esophageal reflux disease without esophagitis: Secondary | ICD-10-CM | POA: Diagnosis not present

## 2015-03-05 DIAGNOSIS — E1151 Type 2 diabetes mellitus with diabetic peripheral angiopathy without gangrene: Secondary | ICD-10-CM | POA: Diagnosis not present

## 2015-03-05 DIAGNOSIS — I480 Paroxysmal atrial fibrillation: Secondary | ICD-10-CM | POA: Diagnosis not present

## 2015-03-05 DIAGNOSIS — E669 Obesity, unspecified: Secondary | ICD-10-CM | POA: Diagnosis not present

## 2015-03-05 DIAGNOSIS — E1140 Type 2 diabetes mellitus with diabetic neuropathy, unspecified: Secondary | ICD-10-CM | POA: Diagnosis not present

## 2015-03-05 DIAGNOSIS — D573 Sickle-cell trait: Secondary | ICD-10-CM | POA: Diagnosis not present

## 2015-03-05 DIAGNOSIS — K5521 Angiodysplasia of colon with hemorrhage: Secondary | ICD-10-CM | POA: Diagnosis not present

## 2015-03-05 DIAGNOSIS — K922 Gastrointestinal hemorrhage, unspecified: Secondary | ICD-10-CM | POA: Diagnosis not present

## 2015-03-05 DIAGNOSIS — E039 Hypothyroidism, unspecified: Secondary | ICD-10-CM | POA: Diagnosis not present

## 2015-03-05 DIAGNOSIS — E1121 Type 2 diabetes mellitus with diabetic nephropathy: Secondary | ICD-10-CM | POA: Diagnosis not present

## 2015-03-05 DIAGNOSIS — I272 Other secondary pulmonary hypertension: Secondary | ICD-10-CM | POA: Diagnosis not present

## 2015-03-05 DIAGNOSIS — I5032 Chronic diastolic (congestive) heart failure: Secondary | ICD-10-CM | POA: Diagnosis not present

## 2015-03-10 DIAGNOSIS — E1140 Type 2 diabetes mellitus with diabetic neuropathy, unspecified: Secondary | ICD-10-CM | POA: Diagnosis not present

## 2015-03-10 DIAGNOSIS — E1151 Type 2 diabetes mellitus with diabetic peripheral angiopathy without gangrene: Secondary | ICD-10-CM | POA: Diagnosis not present

## 2015-03-10 DIAGNOSIS — D573 Sickle-cell trait: Secondary | ICD-10-CM | POA: Diagnosis not present

## 2015-03-10 DIAGNOSIS — E669 Obesity, unspecified: Secondary | ICD-10-CM | POA: Diagnosis not present

## 2015-03-10 DIAGNOSIS — E039 Hypothyroidism, unspecified: Secondary | ICD-10-CM | POA: Diagnosis not present

## 2015-03-10 DIAGNOSIS — I272 Other secondary pulmonary hypertension: Secondary | ICD-10-CM | POA: Diagnosis not present

## 2015-03-10 DIAGNOSIS — D649 Anemia, unspecified: Secondary | ICD-10-CM | POA: Diagnosis not present

## 2015-03-10 DIAGNOSIS — K922 Gastrointestinal hemorrhage, unspecified: Secondary | ICD-10-CM | POA: Diagnosis not present

## 2015-03-10 DIAGNOSIS — K5521 Angiodysplasia of colon with hemorrhage: Secondary | ICD-10-CM | POA: Diagnosis not present

## 2015-03-10 DIAGNOSIS — I5032 Chronic diastolic (congestive) heart failure: Secondary | ICD-10-CM | POA: Diagnosis not present

## 2015-03-10 DIAGNOSIS — K219 Gastro-esophageal reflux disease without esophagitis: Secondary | ICD-10-CM | POA: Diagnosis not present

## 2015-03-10 DIAGNOSIS — E1121 Type 2 diabetes mellitus with diabetic nephropathy: Secondary | ICD-10-CM | POA: Diagnosis not present

## 2015-03-10 DIAGNOSIS — N189 Chronic kidney disease, unspecified: Secondary | ICD-10-CM | POA: Diagnosis not present

## 2015-03-10 DIAGNOSIS — I480 Paroxysmal atrial fibrillation: Secondary | ICD-10-CM | POA: Diagnosis not present

## 2015-03-11 ENCOUNTER — Other Ambulatory Visit: Payer: Medicare Other

## 2015-03-11 ENCOUNTER — Other Ambulatory Visit: Payer: Self-pay | Admitting: *Deleted

## 2015-03-11 ENCOUNTER — Ambulatory Visit: Payer: Medicare Other | Admitting: Hematology and Oncology

## 2015-03-12 ENCOUNTER — Telehealth: Payer: Self-pay | Admitting: Hematology and Oncology

## 2015-03-12 NOTE — Telephone Encounter (Signed)
Left a voicemail on cell to call and reschedule no show as home number rang and had no voicemail

## 2015-03-14 ENCOUNTER — Other Ambulatory Visit (HOSPITAL_COMMUNITY): Payer: Self-pay | Admitting: Cardiology

## 2015-03-17 ENCOUNTER — Other Ambulatory Visit: Payer: Self-pay | Admitting: Neurology

## 2015-03-17 DIAGNOSIS — I480 Paroxysmal atrial fibrillation: Secondary | ICD-10-CM | POA: Diagnosis not present

## 2015-03-17 DIAGNOSIS — E1121 Type 2 diabetes mellitus with diabetic nephropathy: Secondary | ICD-10-CM | POA: Diagnosis not present

## 2015-03-17 DIAGNOSIS — E1140 Type 2 diabetes mellitus with diabetic neuropathy, unspecified: Secondary | ICD-10-CM | POA: Diagnosis not present

## 2015-03-17 DIAGNOSIS — E039 Hypothyroidism, unspecified: Secondary | ICD-10-CM | POA: Diagnosis not present

## 2015-03-17 DIAGNOSIS — K219 Gastro-esophageal reflux disease without esophagitis: Secondary | ICD-10-CM | POA: Diagnosis not present

## 2015-03-17 DIAGNOSIS — N189 Chronic kidney disease, unspecified: Secondary | ICD-10-CM | POA: Diagnosis not present

## 2015-03-17 DIAGNOSIS — E1151 Type 2 diabetes mellitus with diabetic peripheral angiopathy without gangrene: Secondary | ICD-10-CM | POA: Diagnosis not present

## 2015-03-17 DIAGNOSIS — D649 Anemia, unspecified: Secondary | ICD-10-CM | POA: Diagnosis not present

## 2015-03-17 DIAGNOSIS — K922 Gastrointestinal hemorrhage, unspecified: Secondary | ICD-10-CM | POA: Diagnosis not present

## 2015-03-17 DIAGNOSIS — I272 Other secondary pulmonary hypertension: Secondary | ICD-10-CM | POA: Diagnosis not present

## 2015-03-17 DIAGNOSIS — K5521 Angiodysplasia of colon with hemorrhage: Secondary | ICD-10-CM | POA: Diagnosis not present

## 2015-03-17 DIAGNOSIS — I5032 Chronic diastolic (congestive) heart failure: Secondary | ICD-10-CM | POA: Diagnosis not present

## 2015-03-19 ENCOUNTER — Other Ambulatory Visit: Payer: Self-pay

## 2015-03-19 ENCOUNTER — Ambulatory Visit (INDEPENDENT_AMBULATORY_CARE_PROVIDER_SITE_OTHER): Payer: Medicare Other | Admitting: Nurse Practitioner

## 2015-03-19 ENCOUNTER — Telehealth (HOSPITAL_COMMUNITY): Payer: Self-pay | Admitting: Vascular Surgery

## 2015-03-19 ENCOUNTER — Encounter: Payer: Self-pay | Admitting: Nurse Practitioner

## 2015-03-19 VITALS — BP 81/52 | HR 66 | Ht 64.0 in | Wt 154.8 lb

## 2015-03-19 DIAGNOSIS — R413 Other amnesia: Secondary | ICD-10-CM | POA: Diagnosis not present

## 2015-03-19 DIAGNOSIS — R269 Unspecified abnormalities of gait and mobility: Secondary | ICD-10-CM | POA: Diagnosis not present

## 2015-03-19 DIAGNOSIS — E1142 Type 2 diabetes mellitus with diabetic polyneuropathy: Secondary | ICD-10-CM

## 2015-03-19 MED ORDER — PREGABALIN 150 MG PO CAPS: 150.0000 mg | ORAL_CAPSULE | Freq: Three times a day (TID) | ORAL | Status: DC

## 2015-03-19 MED ORDER — NORTRIPTYLINE HCL 10 MG PO CAPS: 20.0000 mg | ORAL_CAPSULE | Freq: Every day | ORAL | Status: DC

## 2015-03-19 NOTE — Telephone Encounter (Signed)
PT called she has lab appt on Friday she does not have a ride she wants to know if Home health can do her labs at home? Please advise

## 2015-03-19 NOTE — Progress Notes (Signed)
I have reviewed and agreed above plan.

## 2015-03-19 NOTE — Progress Notes (Signed)
[facility]  PATIENT: [name] DOB: [birth year]   REASON FOR VISIT: Follow-up for abnormality of gait, peripheral neuropathy, memory loss HISTORY FROM: Patient and sister    HISTORY OF PRESENT ILLNESS: [name], 65 year old female returns for followup diabetic peripheral neuropathy, last clinical visit was with Hoyle Sauer in May 2015  She presented in 2005 with bilateral feet paresthesias which preceded her diagnosis of type 2 diabetes.  EMG nerve conduction study in 2006 was normal, there was no evidence of large fiber peripheral neuropathy.   Her symptoms have progressed over the years, she complains of bilateral feet paresthesia, she is currently on Lyrica 150 mg three times daily and gabapentin 100 twice a day.   She has a history of scleroderma and is on low-dose prednisone. She had a recent hospital admission for hypotension due to nausea and vomiting. Her Lasix was reduced.   UPDATE June 6th 2016:She is now complains of worsening bilateral feet paresthesia, also involving bilateral fingertips, difficulty making a tight grip, worsening gait difficulty, neck pain, almost daily bowel accident for 2 years,  She is taking Lyrica 150 mg 3 times a day, along with low-dose gabapentin, still complains of bilateral feet paresthesia, difficulty sleeping at nighttime. UPDATE September 10 2014: EMG nerve conduction study in August 29 2014 showed mild axonal peripheral neuropathy. 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 There is indentation of the thecal sac at both levels with possible spinal cord compression though there is no myelopathic signal. She complains of whole body achy, her neck pain is 5/10, constant, radiating to right shoulder and arm sometimes. She continues  to complains of bowel incontinence, few times each week,  I have reviewed her most recent cardiology record by Dr. Benjamine Mola in September 05 2014, right side heart failure: Due to severe PAH in the setting of scleroderma. Echo 05/2014 showed EF 55-60% with moderately dilated and moderately dysfunctional RV. Pericardial effusion had resolved. Paroxysmal atrial fibrillation not on anticoagulation due to previous GI bleeding, on amiodarone UPDATE 03/19/15 [name] returns for follow-up. She has a history of gait abnormality, peripheral neuropathy and mild memory loss. 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 She continues to live at home. She has peripheral neuropathy controlled with Lyrica. She is also on nortriptyline which helps her to sleep. She has a long history of scleroderma and cardiac manifestations. She denies any recent falls, blood pressure 81/42.  She returns for reevaluation REVIEW OF SYSTEMS: Full 14 system review of systems performed and notable only for those listed, all others are neg:  Constitutional: neg  Cardiovascular: neg Ear/Nose/Throat: neg  Skin: neg Eyes: neg Respiratory: neg Gastroitestinal: neg  Hematology/Lymphatic: neg  Endocrine: neg Musculoskeletal:neg Allergy/Immunology: neg Neurological: neg Psychiatric: neg Sleep : neg   ALLERGIES: Allergies  Allergen Reactions  . Cephalexin Rash  . Ciprofloxacin Rash  . Codeine Other (See Comments)     GI upset  . Contrast Media [Iodinated Diagnostic Agents] Hives  . Iohexol Hives     Code: HIVES, Desc: pt breaks out in large hives. needs full premeds, Onset Date: [id number]     HOME MEDICATIONS: Outpatient Prescriptions Prior to Visit  Medication Sig Dispense Refill  . ADCIRCA 20 MG TABS TAKE 2 TABLETS (40MG) BY MOUTH ONCE DAILY WITH OR WITHOUT FOOD.  CALL [phone] FOR REFILLS. 60 tablet 11  . ambrisentan (LETAIRIS) 10 MG tablet Take 1 tablet (10 mg total) by mouth daily. 30 tablet  11  . amiodarone (PACERONE) 200 MG tablet Take 1 tablet (200 mg total) by mouth daily. 30 tablet 6  . esomeprazole (NEXIUM) 40 MG capsule Take 40 mg by mouth 2 (two) times daily.     . fenofibrate 54 MG tablet Take 1 tablet (54 mg total) by mouth daily. 30 tablet 12  . FLUoxetine (PROZAC) 20 MG capsule take 1 capsule by mouth every morning 30 capsule 2  . HYDROcodone-acetaminophen (NORCO) 10-325 MG per tablet Take 1 tablet by mouth every 6 (six) hours as needed for moderate pain.     Marland Kitchen levothyroxine (SYNTHROID, LEVOTHROID) 25 MCG tablet Take 1 tablet (25 mcg total) by mouth daily before breakfast. 30 tablet 11  . metolazone (ZAROXOLYN) 2.5 MG tablet Take 1 tablet (2.5 mg total) by mouth once a week. Every Friday as needed for weight greater than 144 lbs 10 tablet 2  . nortriptyline (PAMELOR) 10 MG capsule Take 20 mg by mouth at bedtime.    . potassium chloride SA (K-DUR,KLOR-CON) 10 MEQ tablet Take 2 tablets (20 mEq total) by mouth daily. Take additional 20 meQ with metolazone doses 120 tablet 2  . pregabalin (LYRICA) 150 MG capsule Take 1 capsule (150 mg total) by mouth 3 (three) times daily. 90 capsule 5  . Selexipag (UPTRAVI) 1600 MCG TABS Take 1,600 mcg by mouth 2 (two) times daily. 60 tablet 3  . torsemide (DEMADEX) 20 MG tablet Take 2 tablets (40 mg total) by mouth 2 (two) times daily. 120 tablet 3   No facility-administered medications prior to visit.    PAST MEDICAL HISTORY: Past Medical History  Diagnosis Date  . Secondary pulmonary hypertension ([place])     a. 2/2 scleroderma  . GERD (gastroesophageal reflux disease)   . Systemic sclerosis ([place])   . Unspecified essential hypertension   . Gastroparesis   . Sickle cell trait ([place])   . Obesity   . Scleroderma ([place])   . Trichomonas   . Peripheral neuropathy ([place])   . Type II diabetes mellitus (HCC)     a. diet control   . Arthritis   . Chronic kidney disease   . Gout   . Hypothyroidism     a. may be due to amiodarone use.  started on synthroid on 12/2014 admission   . Anemia     a.  hx of GI bleed and duodenal AVMs  . Chronic diastolic CHF (congestive heart failure) ([place])     a. Echo 12/23/14 withEF 60-65%, moderately dilated RV, PASP 72, no pericardial effusion.  Marland Kitchen PAF (paroxysmal atrial fibrillation) (HCC)     a. not a long term AC candidate due to GI bleeds  . Angiodysplasia of colon with hemorrhage     PAST SURGICAL HISTORY: Past Surgical History  Procedure Laterality Date  . Tubal ligation    . Esophagogastroduodenoscopy  02/23/2010    D 2 AVM, ablated with APC.   Marland Kitchen Shoulder arthroscopy Right 12/2009    subacromial decompression  . Replacement total knee Left 11/2000  . Cardiac catheterization Right 04/24/2004  . Tonsillectomy  1960's  . Abdominal hysterectomy  1983    "partial"  . Shoulder arthroscopy w/ rotator cuff repair Right 01/2010  . Excisional total knee arthroplasty with antibiotic spacers Left 08/2010    "got infected & had to take 1st replacement out"  . Revision total  knee arthroplasty Left 11/2010    "removed spacers; replaced knee"  . Total knee revision with scar debridement/patella revision with poly exchange Left 12/2010    fell; knee split opened; had to redo revision"  . Cardiac catheterization Left 07/2010  . Peripherally inserted central catheter insertion  09/2010  . Colonoscopy  09/06/2008, 09/2013    int rrhoids:Dr. Lajoyce Corners 2010. Pan-colonic AVMs, microscopic colitis per Dr Deatra Ina 2015  . Right heart catheterization N/A 03/18/2014    Procedure: RIGHT HEART CATH;  Surgeon: Larey Dresser, MD;  Location: [facility] CATH LAB;  Service: Cardiovascular;  Laterality: N/A;  . Esophagogastroduodenoscopy N/A 03/23/2014    Procedure: ESOPHAGOGASTRODUODENOSCOPY (EGD);  Surgeon: Ladene Artist, MD;  Location: [facility] ENDOSCOPY;  Service: Endoscopy;  Laterality: N/A;  . Pericardial tap N/A 05/03/2014    Procedure: PERICARDIAL TAP;  Surgeon: Sinclair Grooms, MD;  Location[facility] CATH LAB;  Service:  Cardiovascular;  Laterality: N/A;  . Esophagogastroduodenoscopy N/A 02/21/2015    Procedure: ESOPHAGOGASTRODUODENOSCOPY (EGD);  Surgeon: Ladene Artist, MD;  Location: [facility] ENDOSCOPY;  Service: Endoscopy;  Laterality: N/A;  . Colonoscopy N/A 02/24/2015    Procedure: COLONOSCOPY;  Surgeon: Gatha Mayer, MD;  Location: [place];  Service: Endoscopy;  Laterality: N/A;    FAMILY HISTORY: Family History  Problem Relation Age of Onset  . Heart disease Mother   . Diabetes Mother   . Diabetes Sister     SOCIAL HISTORY: Social History   Social History  . Marital Status: Legally Separated    Spouse Name: N/A  . Number of Children: 2  . Years of Education: 11   Occupational History  . Umemployed   . Disability     Social History Main Topics  . Smoking status: Never Smoker   . Smokeless tobacco: Never Used  . Alcohol Use: No  . Drug Use: No  . Sexual Activity: No   Other Topics Concern  . Not on file   Social History Narrative    FAMILY HISTORY:  Significant for coronary artery disease and diabetes   Patient lives at home with granddaughter.    Patient has 2 children.    Patient has 11 years of education.    Patient is on disability.    Patient is right handed.    Patient is separated.       PHYSICAL EXAM  Filed Vitals:   03/19/15 1118  BP: 81/42  Pulse: 66  Height: _0  (1.626 m)  Weight: 154 lb 12.8 oz (70.217 kg)   Body mass index is 26.56 kg/(m^2).  Generalized: Well developed, obese female in no acute distress  Head: normocephalic and atraumatic,. Oropharynx benign  Neck: Supple, no carotid bruits  Cardiac: Regular rate rhythm,  Musculoskeletal: No deformity   Neurological examination   Mentation: Alert oriented to time, place, history taking. MMSE 27/30. She missed 2 of 3 recall. Attention span and concentration appropriate. AFT 5. Clock drawing 3/4.  Follows all commands speech and language fluent.   Cranial nerve II-XII: Fundoscopic exam reveals  sharp disc margins.Pupils were equal round reactive to light extraocular movements were full, visual field were full on confrontational test. Facial sensation and strength were normal. hearing was intact to finger rubbing bilaterally. Uvula tongue midline. head turning and shoulder shrug were normal and symmetric.Tongue protrusion into cheek strength was normal. Motor: normal bulk and tone, full strength in the BUE, BLE, fine finger movements normal, no pronator drift. No focal weakness Sensory: Length dependent decreased light touch pinprick  to distal leg preserved toe position sense, and vibratory sense  Coordination: finger-nose-finger, heel-to-shin bilaterally, no dysmetria Reflexes: Brachioradialis 2/2, biceps 2/2, triceps 2/2, patellar 2/2, Achilles absent plantar responses were flexor bilaterally. Gait and Station: Rising up from seated position without assistance, cautious mildly unsteady gait DIAGNOSTIC DATA (LABS, IMAGING, TESTING) - I reviewed patient records, labs, notes, testing and imaging myself where available.  Lab Results  Component Value Date   WBC 7.1 03/04/2015   HGB 8.6* 03/04/2015   HCT 26.7* 03/04/2015   MCV 86.7 03/04/2015   PLT 182 03/04/2015      Component Value Date/Time   NA 138 03/04/2015 1507   K 4.2 03/04/2015 1507   CL 100* 03/04/2015 1507   CO2 24 03/04/2015 1507   GLUCOSE 71 03/04/2015 1507   BUN 66* 03/04/2015 1507   CREATININE 2.40* 03/04/2015 1507   CREATININE 1.82* 09/04/2014 1145   CALCIUM 8.4* 03/04/2015 1507   PROT 6.5 02/20/2015 2111   ALBUMIN 3.1* 02/20/2015 2111   AST 22 02/20/2015 2111   ALT 11* 02/20/2015 2111   ALKPHOS 78 02/20/2015 2111   BILITOT 0.7 02/22/2015 1000   GFRNONAA 20* 03/04/2015 1507   GFRNONAA 29* 09/04/2014 1145   GFRAA 24* 03/04/2015 1507   GFRAA 34* 09/04/2014 1145    Lab Results  Component Value Date   HGBA1C 5.0 09/04/2014   Lab Results  Component Value Date   VITAMINB12 472 12/23/2014   Lab Results    Component Value Date   TSH 3.314 02/21/2015      ASSESSMENT AND PLAN  65 y.o. year old female  with past medical history of diabetes scleroderma chronic body pain referral neuropathy and gait difficulty and mild memory loss. PLAN:  1. Peripheral neuropathy, Continue Lyrica 159m tid.  2. Insomnia continue  Nortriptyline 133m2 tabs poqhs this is also affective for pain 3, gait difficulty, multifactorial, includes peripheral neuropathy, deconditioning, cervical spondylitic myelopathy, She has multiple medical comorbidities, includes right sided heart failure, long-standing history of scleroderma, she is does not want to consider surgical option, or physical therapy at this point,  4, mild cognitive impairment, Mini-Mental Status Examination is 27 out of 30 today, 5. she should continue moderate exercise as tolerated, return to clinic in 6 months, Vst time 25 min NaDennie BibleGNWashakie Medical CenterBCHazel [facility] HospitalAPWe[facility]re[facility] [address]SuGladwinrWilmington[place] [phone]

## 2015-03-19 NOTE — Patient Instructions (Signed)
Continue Lyrica will refill Continue nortriptyline will refill Memory score is stable Follow-up in 6-8 months

## 2015-03-20 NOTE — Telephone Encounter (Signed)
Spoke w/pt she does not have home health and does not have transportation for labs tomorrow, she is sch to see Dr Aundra Dubin 1/12 and will have labs at that time

## 2015-03-21 ENCOUNTER — Other Ambulatory Visit (HOSPITAL_COMMUNITY): Payer: Medicare Other

## 2015-03-24 DIAGNOSIS — I480 Paroxysmal atrial fibrillation: Secondary | ICD-10-CM | POA: Diagnosis not present

## 2015-03-24 DIAGNOSIS — I272 Other secondary pulmonary hypertension: Secondary | ICD-10-CM | POA: Diagnosis not present

## 2015-03-24 DIAGNOSIS — K219 Gastro-esophageal reflux disease without esophagitis: Secondary | ICD-10-CM | POA: Diagnosis not present

## 2015-03-24 DIAGNOSIS — N189 Chronic kidney disease, unspecified: Secondary | ICD-10-CM | POA: Diagnosis not present

## 2015-03-24 DIAGNOSIS — D649 Anemia, unspecified: Secondary | ICD-10-CM | POA: Diagnosis not present

## 2015-03-24 DIAGNOSIS — E1121 Type 2 diabetes mellitus with diabetic nephropathy: Secondary | ICD-10-CM | POA: Diagnosis not present

## 2015-03-24 DIAGNOSIS — K5521 Angiodysplasia of colon with hemorrhage: Secondary | ICD-10-CM | POA: Diagnosis not present

## 2015-03-24 DIAGNOSIS — E039 Hypothyroidism, unspecified: Secondary | ICD-10-CM | POA: Diagnosis not present

## 2015-03-24 DIAGNOSIS — E1140 Type 2 diabetes mellitus with diabetic neuropathy, unspecified: Secondary | ICD-10-CM | POA: Diagnosis not present

## 2015-03-24 DIAGNOSIS — K922 Gastrointestinal hemorrhage, unspecified: Secondary | ICD-10-CM | POA: Diagnosis not present

## 2015-03-24 DIAGNOSIS — E1151 Type 2 diabetes mellitus with diabetic peripheral angiopathy without gangrene: Secondary | ICD-10-CM | POA: Diagnosis not present

## 2015-03-24 DIAGNOSIS — I5032 Chronic diastolic (congestive) heart failure: Secondary | ICD-10-CM | POA: Diagnosis not present

## 2015-03-26 ENCOUNTER — Telehealth (HOSPITAL_COMMUNITY): Payer: Self-pay | Admitting: *Deleted

## 2015-03-26 DIAGNOSIS — M79606 Pain in leg, unspecified: Secondary | ICD-10-CM | POA: Diagnosis not present

## 2015-03-26 DIAGNOSIS — K219 Gastro-esophageal reflux disease without esophagitis: Secondary | ICD-10-CM | POA: Diagnosis not present

## 2015-03-26 DIAGNOSIS — M340 Progressive systemic sclerosis: Secondary | ICD-10-CM | POA: Diagnosis not present

## 2015-03-26 DIAGNOSIS — M150 Primary generalized (osteo)arthritis: Secondary | ICD-10-CM | POA: Diagnosis not present

## 2015-03-26 DIAGNOSIS — I272 Other secondary pulmonary hypertension: Secondary | ICD-10-CM | POA: Diagnosis not present

## 2015-03-26 NOTE — Telephone Encounter (Signed)
Received fax from [place] [facility] stating pt needed an urgent PA on her [name] as it expired 03/15/15 and pt is due to have med shipped out to her on 03/27/15.  I called [facility] at [phone], pt's ID [id number], and completed urgent PA via phone.  It was sent to review and can take up to 23 hours for result, ref # [id number].

## 2015-03-27 ENCOUNTER — Ambulatory Visit (HOSPITAL_COMMUNITY)
Admission: RE | Admit: 2015-03-27 | Discharge: 2015-03-27 | Disposition: A | Discharge status: Home / Self Care | Payer: Medicare Other | Source: Ambulatory Visit | Attending: Cardiology | Admitting: Cardiology

## 2015-03-27 ENCOUNTER — Encounter (HOSPITAL_COMMUNITY): Payer: Self-pay

## 2015-03-27 VITALS — BP 94/56 | HR 71 | Wt 154.5 lb

## 2015-03-27 DIAGNOSIS — M109 Gout, unspecified: Secondary | ICD-10-CM | POA: Diagnosis not present

## 2015-03-27 DIAGNOSIS — N183 Chronic kidney disease, stage 3 (moderate): Secondary | ICD-10-CM | POA: Insufficient documentation

## 2015-03-27 DIAGNOSIS — E1122 Type 2 diabetes mellitus with diabetic chronic kidney disease: Secondary | ICD-10-CM | POA: Diagnosis not present

## 2015-03-27 DIAGNOSIS — I2781 Cor pulmonale (chronic): Secondary | ICD-10-CM | POA: Diagnosis not present

## 2015-03-27 DIAGNOSIS — D573 Sickle-cell trait: Secondary | ICD-10-CM | POA: Diagnosis not present

## 2015-03-27 DIAGNOSIS — I129 Hypertensive chronic kidney disease with stage 1 through stage 4 chronic kidney disease, or unspecified chronic kidney disease: Secondary | ICD-10-CM | POA: Diagnosis not present

## 2015-03-27 DIAGNOSIS — I480 Paroxysmal atrial fibrillation: Secondary | ICD-10-CM

## 2015-03-27 DIAGNOSIS — I3139 Other pericardial effusion (noninflammatory): Secondary | ICD-10-CM

## 2015-03-27 DIAGNOSIS — I2721 Secondary pulmonary arterial hypertension: Secondary | ICD-10-CM

## 2015-03-27 DIAGNOSIS — E1143 Type 2 diabetes mellitus with diabetic autonomic (poly)neuropathy: Secondary | ICD-10-CM | POA: Diagnosis not present

## 2015-03-27 DIAGNOSIS — D509 Iron deficiency anemia, unspecified: Secondary | ICD-10-CM | POA: Insufficient documentation

## 2015-03-27 DIAGNOSIS — K219 Gastro-esophageal reflux disease without esophagitis: Secondary | ICD-10-CM | POA: Diagnosis not present

## 2015-03-27 DIAGNOSIS — Z833 Family history of diabetes mellitus: Secondary | ICD-10-CM | POA: Insufficient documentation

## 2015-03-27 DIAGNOSIS — K3184 Gastroparesis: Secondary | ICD-10-CM | POA: Diagnosis not present

## 2015-03-27 DIAGNOSIS — I5032 Chronic diastolic (congestive) heart failure: Secondary | ICD-10-CM

## 2015-03-27 DIAGNOSIS — I319 Disease of pericardium, unspecified: Secondary | ICD-10-CM | POA: Diagnosis not present

## 2015-03-27 DIAGNOSIS — Z79899 Other long term (current) drug therapy: Secondary | ICD-10-CM | POA: Diagnosis not present

## 2015-03-27 DIAGNOSIS — I272 Other secondary pulmonary hypertension: Secondary | ICD-10-CM

## 2015-03-27 DIAGNOSIS — M1990 Unspecified osteoarthritis, unspecified site: Secondary | ICD-10-CM | POA: Insufficient documentation

## 2015-03-27 DIAGNOSIS — Z8249 Family history of ischemic heart disease and other diseases of the circulatory system: Secondary | ICD-10-CM | POA: Diagnosis not present

## 2015-03-27 DIAGNOSIS — E039 Hypothyroidism, unspecified: Secondary | ICD-10-CM | POA: Diagnosis not present

## 2015-03-27 DIAGNOSIS — E785 Hyperlipidemia, unspecified: Secondary | ICD-10-CM | POA: Insufficient documentation

## 2015-03-27 DIAGNOSIS — M349 Systemic sclerosis, unspecified: Secondary | ICD-10-CM | POA: Diagnosis not present

## 2015-03-27 DIAGNOSIS — I313 Pericardial effusion (noninflammatory): Secondary | ICD-10-CM

## 2015-03-27 LAB — COMPREHENSIVE METABOLIC PANEL
ALBUMIN: 3.1 g/dL — AB (ref 3.5–5.0)
ALK PHOS: 111 U/L (ref 38–126)
ALT: 9 U/L — ABNORMAL LOW (ref 14–54)
ANION GAP: 10 (ref 5–15)
AST: 20 U/L (ref 15–41)
BUN: 47 mg/dL — AB (ref 6–20)
CALCIUM: 8.8 mg/dL — AB (ref 8.9–10.3)
CO2: 25 mmol/L (ref 22–32)
Chloride: 108 mmol/L (ref 101–111)
Creatinine, Ser: 1.67 mg/dL — ABNORMAL HIGH (ref 0.44–1.00)
GFR calc Af Amer: 37 mL/min — ABNORMAL LOW (ref >60)
GFR calc non Af Amer: 32 mL/min — ABNORMAL LOW (ref >60)
GLUCOSE: 72 mg/dL (ref 65–99)
Potassium: 4 mmol/L (ref 3.5–5.1)
SODIUM: 143 mmol/L (ref 135–145)
Total Bilirubin: 0.6 mg/dL (ref 0.3–1.2)
Total Protein: 7.2 g/dL (ref 6.5–8.1)

## 2015-03-27 LAB — CBC
HEMATOCRIT: 28.1 % — AB (ref 36.0–46.0)
HEMOGLOBIN: 8.8 g/dL — AB (ref 12.0–15.0)
MCH: 27.3 pg (ref 26.0–34.0)
MCHC: 31.3 g/dL (ref 30.0–36.0)
MCV: 87.3 fL (ref 78.0–100.0)
PLATELETS: 138 10*3/uL — AB (ref 150–400)
RBC: 3.22 MIL/uL — AB (ref 3.87–5.11)
RDW: 18 % — AB (ref 11.5–15.5)
WBC: 5.7 10*3/uL (ref 4.0–10.5)

## 2015-03-27 LAB — BRAIN NATRIURETIC PEPTIDE: B Natriuretic Peptide: 447.6 pg/mL — ABNORMAL HIGH (ref 0.0–100.0)

## 2015-03-27 MED ORDER — METOLAZONE 2.5 MG PO TABS: 2.5000 mg | ORAL_TABLET | ORAL | Status: DC

## 2015-03-27 NOTE — Telephone Encounter (Signed)
Med wad denied, have called and started an urgent appeal.  Pt is aware and states she did receive a shipment today

## 2015-03-27 NOTE — Patient Instructions (Signed)
Take metolazone 2.5 mg tomorrow and Sunday. Then, take once every Tuesday and Friday. Take an extra potassium tablet when you take metolazone.  We will be switching you from Adcirca to [place]. Please continue taking your Adcirca until you are further notified.  Routine lab work today. Will notify you of abnormal results, otherwise no news is good news!  Follow up 2 weeks.  Do the following things EVERYDAY: 1) Weigh yourself in the morning before breakfast. Write it down and keep it in a log. 2) Take your medicines as prescribed 3) Eat low salt foods-Limit salt (sodium) to 2000 mg per day.  4) Stay as active as you can everyday 5) Limit all fluids for the day to less than 2 liters

## 2015-03-28 ENCOUNTER — Encounter (HOSPITAL_COMMUNITY): Payer: Self-pay | Admitting: *Deleted

## 2015-03-28 NOTE — Progress Notes (Signed)
Patient ID: [name], female   DOB: 10/09/1950, 65 y.o.   MRN: [id number] P  Advanced Heart Failure Clinic Note   Patient ID: [name], female   DOB: [birth year], 65 y.o.   MRN: [id number] Pulmonary: Dr Lamonte Sakai  HF: Dr Aundra Dubin  HPI: [name] is a 65 yo female with a history of DM2, HLD, HTN, pHTN, GERD, gastroparesis, scleroderma, DJD and diastolic HF.   Of note she has been on several PAH targeted therapies and coumadin over last 9 years, but has not been reliably on therapy due to noncompliance and inability to have labs performed. She has had best response to Tyvaso (+ bosentan) with an improvement in 6 minute walk and in PASP from 102 mmHg (7/09) to 42 mmHg (9/10). After stopping Tyvaso her PASP rose to 80's by TTE 6/13 and 11/14. Surprisingly, she has never required supplemental O2. Macitentan was started in 11/14. She developed nausea and diarrhea and macitentan was stopped early 3/15. Most recently, she had been on Letairis and Adcirca (followed by Dr. Lamonte Sakai).   Admitted 10/14-10/18/15 for A/C RHF, volume overload and syncope. Diuresed with IV lasix and milrinone a total of 26 lbs. Discharge weight was 156 lbs and started on lasix 40 mg BID.   Admitted 12/29 with dyspnea and chest pain. BP was low, and Lasix and Adcirca were both initially held. Echo showed EF 60-65% with moderately dilated and dysfunctional RV, PASP 111 mmHg with moderate TR. There was a large pericardial effusion without definite tamponade. Presentation was suspected to be due to progressive PAH with secondary RV failure rather than tamponade. No pericardiocentesis yet. She was started on milrinone gtt and diuresed. Shorter-acting sildenafil was started instead of Adcirca. Also had GI bleed. 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 Discharge weight was 146 pounds.   She was admitted in 2/16 with acute on chronic diastolic CHF/RV failure and hypotension.  Echo (2/16)  showed EF 60%, moderate-severe RV dilation with moderate-severe RV systolic dysfunction, PA systolic pressure 62 mmHg, large pericardial effusion with borderline tamponade.  She had pericardiocentesis with improvement in symptoms.  No malignant cells in pericardial fluid.  Repeat echo showed no pericardial effusion.  She developed paroxysmal atrial fibrillation on 2/19 but went back into NSR on amiodarone.  She was not anticoagulated given history of GI bleeding and pericardial effusion.   She was again admitted in 10/16.  She had been out of meds x 1 week and gained 20 lbs.  She also was noted to be severely anemic with iron deficiency.  She was heme negative.  She got 2 units PRBCs.  She was diuresed extensively.  She was started on Levoxyl for hypothyroidism.   Admitted In December 2016  with GI bleed. Multiple AVMs noted with 10-15 ablations completed. PAH meds continued. Diuretics cut back --torsemide 40 mg daily. Discharge weight was 138  pounds.   She returns for followup.  Weight is up 7 lbs.  She has increased torsemide on her own to 60 mg bid but has still been gaining weight at home.  She is short of breath after walking 40-50 yards or going up an incline.  She is short of breath with housework.  No palpitations or lightheadedness.  No orthopnea/PND.  No melena/BRBPR.   6 min walk (05/16/14): 125 m 6 min walk (6/16): 146 m 6 min walk (8/16): 183 m  Echo (1/16) with EF 55-60%, severely dilated RA with moderately decreased systolic  function, large pericardial effusion.  ECHO 05/09/2014: EF 60-65% Peak PA pressure 52  ECHO 06/05/14: EF 55-60%, RV moderately dilated with moderately decreased systolic function with D-shaped interventricular septum. Severe RAE. RVSP 59. No pericardial effusion. Echo (8/16): EF 60-65%, moderate-severe RV dilation with mildly decreased RV systolic function, D-shaped interventricular septum, PASP 92, severe biatrial enlargement, no pericardial effusion.  Echo (10/16):  EF 60-65%, D-shaped septum, RV moderately dilated, PASP 76 mmHg, severe TR.  ABIs (11/16): Normal  RHC (1/16):  Hemodynamics (mmHg) RA mean 12 RV 64/14 PA 68/29, mean 42 PCWP mean 12 LV 102/14 AO 105/53 PA 67% AO 93% Cardiac Output (Fick) 6.63  Cardiac Index (Fick) 3.88  PVR 4.5 WU  Labs 03/24/2014 K 3.8 Creatinine 1.51 Hgb 9.3  Labs 2/16 BNP 121, K 4.4, creatinine 1.27, hgb 9.2 Labs 2/16 K 3.8, creatinine 1.58, AST 45, ALT 12 Labs 05/22/2014: Creatinine 2.1 K 3.7  Labs 06/05/2014: Creatinine 1.5 K 3.7, LFTs normal Labs 6/16: K 4.4, creatinine 1.82, hgb 8.8 Labs 10/16: K 4.4, creatinine 1.77 => 1.64, hgb 7.9 => 8.4, LFTs normal, Mg 1.6 Labs 01/21/2015: K 3.8 Creatinine 1.67  Labs 02/2015: K 4.3 Creatinine 1.72 => 2.4 Hgb 8.5    ROS: All systems negative except as listed in HPI, PMH and Problem List.  SH:  Social History   Social History  . Marital Status: Legally Separated    Spouse Name: N/A  . Number of Children: 2  . Years of Education: 11   Occupational History  . Umemployed   . Disability     Social History Main Topics  . Smoking status: Never Smoker   . Smokeless tobacco: Never Used  . Alcohol Use: No  . Drug Use: No  . Sexual Activity: No   Other Topics Concern  . Not on file   Social History Narrative    FAMILY HISTORY:  Significant for coronary artery disease and diabetes   Patient lives at home with granddaughter.    Patient has 2 children.    Patient has 11 years of education.    Patient is on disability.    Patient is right handed.    Patient is separated.      FH:  Family History  Problem Relation Age of Onset  . Heart disease Mother   . Diabetes Mother   . Diabetes Sister     Past Medical History  Diagnosis Date  . Secondary pulmonary hypertension ([place])     a. 2/2 scleroderma  . GERD (gastroesophageal reflux disease)   . Systemic sclerosis ([place])   . Unspecified essential hypertension   . Gastroparesis   . Sickle cell trait  ([place])   . Obesity   . Scleroderma ([place])   . Trichomonas   . Peripheral neuropathy ([place])   . Type II diabetes mellitus ([facility])     a. diet control   . Arthritis   . Chronic kidney disease   . Gout   . Hypothyroidism     a. may be due to amiodarone use. started on synthroid on 12/2014 admission   . Anemia     a.  hx of GI bleed and duodenal AVMs  . Chronic diastolic CHF (congestive heart failure) ([place])     a. Echo 12/23/14 withEF 60-65%, moderately dilated RV, PASP 72, no pericardial effusion.  Marland Kitchen PAF (paroxysmal atrial fibrillation) (H[facility])     a. not a long term AC candidate due to GI bleeds  . Angiodysplasia of colon with hemorrhage  Current Outpatient Prescriptions  Medication Sig Dispense Refill  . ADCIRCA 20 MG TABS TAKE 2 TABLETS (40MG) BY MOUTH ONCE DAILY WITH OR WITHOUT FOOD. CALL [phone] FOR REFILLS. 60 tablet 11  . ambrisentan (LETAIRIS) 10 MG tablet Take 1 tablet (10 mg total) by mouth daily. 30 tablet 11  . amiodarone (PACERONE) 200 MG tablet Take 1 tablet (200 mg total) by mouth daily. 30 tablet 6  . esomeprazole (NEXIUM) 40 MG capsule Take 40 mg by mouth 2 (two) times daily.     . fenofibrate 54 MG tablet Take 1 tablet (54 mg total) by mouth daily. 30 tablet 12  . FLUoxetine (PROZAC) 20 MG capsule take 1 capsule by mouth every morning 30 capsule 2  . HYDROcodone-acetaminophen (NORCO) 10-325 MG per tablet Take 1 tablet by mouth every 6 (six) hours as needed for moderate pain.     Marland Kitchen levothyroxine (SYNTHROID, LEVOTHROID) 25 MCG tablet Take 1 tablet (25 mcg total) by mouth daily before breakfast. 30 tablet 11  . nortriptyline (PAMELOR) 10 MG capsule Take 2 capsules (20 mg total) by mouth at bedtime. 60 capsule 8  . potassium chloride SA (K-DUR,KLOR-CON) 10 MEQ tablet Take 2 tablets (20 mEq total) by mouth daily. Take additional 20 meQ with metolazone doses 120 tablet 2  . pregabalin (LYRICA) 150 MG capsule Take 1 capsule (150 mg total) by mouth 3 (three) times daily. 90  capsule 5  . Selexipag (UPTRAVI) 1600 MCG TABS Take 1,600 mcg by mouth 2 (two) times daily. 60 tablet 3  . torsemide (DEMADEX) 20 MG tablet Take 60 mg by mouth 2 (two) times daily.    . metolazone (ZAROXOLYN) 2.5 MG tablet Take 1 tablet (2.5 mg total) by mouth 2 (two) times a week. (Tuesdays and Fridays) 20 tablet 3   No current facility-administered medications for this encounter.    Filed Vitals:   03/27/15 1447  BP: 94/56  Pulse: 71  Weight: 154 lb 8 oz (70.081 kg)  SpO2: 99%    PHYSICAL EXAM: General: Elderly and fatigued appearing. Sister present.  Neck: JVP 12 cm. No o thyromegaly or thyroid nodule. Lungs: diminished, bilateral crackles in bases. CV: RV heave. Heart regular S1/S2 with widely split S2,  2/6 HSM LLSB. R and LLE 1+ edema to knees.   Unable to palpate pedal pulses.  Abdomen: no hepatosplenomegaly. Nontender, mildly distended. Neurologic: Alert and oriented x 3.  Psych: Affect flat. Extremities: No clubbing or cyanosis.   ASSESSMENT & PLAN: 1. 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  Echo 10/16  EF 60-65% with moderate dilated and mildly dysfunctional RV, severe TR. Pericardial effusion has resolved. NYHA class III symptoms.  She is more volume overloaded today despite increasing torsemide on her own.  - Continue torsemide 60 mg bid.  - Add metolazone 2.5 mg 30 minutes before the am torsemide dose on Friday morning then Sunday morning.  After that, take metolazone every Friday and Sunday. She will take an extra KCl 20 on metolazone days.  - BMET/BNP today, repeat in 2 wks.  2. PAH with cor pulmonale: Severe PAH.  Had last [place] in 1/16. Pulmonary pressure remains elevated by echo in 10/16. - Continue ambrisentan 10 mg daily.  - Continue selexipag 1600 mcg bid.  - Continue Adcirca 40 mg daily => however, given ongoing symptoms with RV failure and persistently elevated PA pressure, I will work on getting approval for riociguat to  replace Adcirca.  - Given volume overload, will  diurese and arrange for 6 minute walk at next visit.  3. CKD stage III: Check BMET today.  4. 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 She is no longer anticoagulated. Check CBC today.  5. Pericardial effusion: Resolved after pericardiocentesis, not seen on echo 12/2014.   6. Atrial fibrillation: Regular rhythm. Paroxysmal.  Continue amiodarone. She is not anticoagulated due to h/o GI bleeding. Check LFTs/TSH. She will need a yearly eye exam while on amiodarone. 7.  Hypothyroidism: May be related to amiodarone.  She is now on Levoxyl 25 mcg daily.   8. Anemia: Check CBC today.   [name] 03/28/2015

## 2015-03-31 DIAGNOSIS — I5032 Chronic diastolic (congestive) heart failure: Secondary | ICD-10-CM | POA: Diagnosis not present

## 2015-03-31 DIAGNOSIS — I272 Other secondary pulmonary hypertension: Secondary | ICD-10-CM | POA: Diagnosis not present

## 2015-03-31 DIAGNOSIS — K219 Gastro-esophageal reflux disease without esophagitis: Secondary | ICD-10-CM | POA: Diagnosis not present

## 2015-03-31 DIAGNOSIS — D649 Anemia, unspecified: Secondary | ICD-10-CM | POA: Diagnosis not present

## 2015-03-31 DIAGNOSIS — N189 Chronic kidney disease, unspecified: Secondary | ICD-10-CM | POA: Diagnosis not present

## 2015-03-31 DIAGNOSIS — E1140 Type 2 diabetes mellitus with diabetic neuropathy, unspecified: Secondary | ICD-10-CM | POA: Diagnosis not present

## 2015-03-31 DIAGNOSIS — K5521 Angiodysplasia of colon with hemorrhage: Secondary | ICD-10-CM | POA: Diagnosis not present

## 2015-03-31 DIAGNOSIS — E039 Hypothyroidism, unspecified: Secondary | ICD-10-CM | POA: Diagnosis not present

## 2015-03-31 DIAGNOSIS — E1121 Type 2 diabetes mellitus with diabetic nephropathy: Secondary | ICD-10-CM | POA: Diagnosis not present

## 2015-03-31 DIAGNOSIS — I480 Paroxysmal atrial fibrillation: Secondary | ICD-10-CM | POA: Diagnosis not present

## 2015-03-31 DIAGNOSIS — K922 Gastrointestinal hemorrhage, unspecified: Secondary | ICD-10-CM | POA: Diagnosis not present

## 2015-03-31 DIAGNOSIS — E1151 Type 2 diabetes mellitus with diabetic peripheral angiopathy without gangrene: Secondary | ICD-10-CM | POA: Diagnosis not present

## 2015-04-01 ENCOUNTER — Telehealth: Payer: Self-pay | Admitting: Internal Medicine

## 2015-04-01 ENCOUNTER — Telehealth: Payer: Self-pay | Admitting: Hematology and Oncology

## 2015-04-01 NOTE — Telephone Encounter (Signed)
CBC's have been stable since last discharge.  You can stop weekly CBC and plan to check only if she has recurrence of her gi Bleeding.

## 2015-04-01 NOTE — Telephone Encounter (Signed)
[place] # [id number] VALID UNTIL 03/14/16

## 2015-04-01 NOTE — Telephone Encounter (Signed)
Spoke with patient about appointment change 2/10 to 2/17. Appointment sent via mail.

## 2015-04-01 NOTE — Telephone Encounter (Signed)
Donita from [facility] requesting the nurse to call back regarding pt.

## 2015-04-01 NOTE — Telephone Encounter (Signed)
Spoke with Donita, she is requesting an update on how much longer to continue weekly CBC monitoring. Last hgb was 9.1 on yesterday.    Please advise

## 2015-04-01 NOTE — Telephone Encounter (Signed)
Donita aware OK to d/c weekly CBC

## 2015-04-07 ENCOUNTER — Telehealth: Payer: Self-pay | Admitting: Emergency Medicine

## 2015-04-07 ENCOUNTER — Other Ambulatory Visit: Payer: Self-pay | Admitting: Internal Medicine

## 2015-04-07 MED ORDER — FENOFIBRATE 54 MG PO TABS: 54.0000 mg | ORAL_TABLET | Freq: Every day | ORAL | Status: DC

## 2015-04-07 NOTE — Telephone Encounter (Signed)
Spoke with pt. Advised her that she would need to contact her PCP for a refill on this medication. She agreed and verbalized understanding. Nothing further was needed.

## 2015-04-07 NOTE — Telephone Encounter (Signed)
Patient requesting Fenofibrate refill

## 2015-04-09 ENCOUNTER — Telehealth: Payer: Self-pay | Admitting: Emergency Medicine

## 2015-04-09 MED ORDER — AMBRISENTAN 10 MG PO TABS: 10.0000 mg | ORAL_TABLET | Freq: Every day | ORAL | Status: DC

## 2015-04-09 NOTE — Telephone Encounter (Signed)
rx has been sent to [facility].  Nothing further needed.

## 2015-04-11 ENCOUNTER — Ambulatory Visit (HOSPITAL_COMMUNITY)
Admission: RE | Admit: 2015-04-11 | Discharge: 2015-04-11 | Disposition: A | Discharge status: Home / Self Care | Payer: Medicare Other | Source: Ambulatory Visit | Attending: Cardiology | Admitting: Cardiology

## 2015-04-11 ENCOUNTER — Encounter (HOSPITAL_COMMUNITY): Payer: Self-pay

## 2015-04-11 VITALS — BP 100/56 | HR 69 | Wt 146.0 lb

## 2015-04-11 DIAGNOSIS — K3184 Gastroparesis: Secondary | ICD-10-CM | POA: Diagnosis not present

## 2015-04-11 DIAGNOSIS — I5032 Chronic diastolic (congestive) heart failure: Secondary | ICD-10-CM | POA: Diagnosis not present

## 2015-04-11 DIAGNOSIS — E1143 Type 2 diabetes mellitus with diabetic autonomic (poly)neuropathy: Secondary | ICD-10-CM | POA: Diagnosis not present

## 2015-04-11 DIAGNOSIS — E1142 Type 2 diabetes mellitus with diabetic polyneuropathy: Secondary | ICD-10-CM | POA: Diagnosis not present

## 2015-04-11 DIAGNOSIS — N183 Chronic kidney disease, stage 3 (moderate): Secondary | ICD-10-CM | POA: Diagnosis not present

## 2015-04-11 DIAGNOSIS — Z79899 Other long term (current) drug therapy: Secondary | ICD-10-CM | POA: Diagnosis not present

## 2015-04-11 DIAGNOSIS — Z833 Family history of diabetes mellitus: Secondary | ICD-10-CM | POA: Diagnosis not present

## 2015-04-11 DIAGNOSIS — E1122 Type 2 diabetes mellitus with diabetic chronic kidney disease: Secondary | ICD-10-CM | POA: Diagnosis not present

## 2015-04-11 DIAGNOSIS — M1990 Unspecified osteoarthritis, unspecified site: Secondary | ICD-10-CM | POA: Insufficient documentation

## 2015-04-11 DIAGNOSIS — K219 Gastro-esophageal reflux disease without esophagitis: Secondary | ICD-10-CM | POA: Insufficient documentation

## 2015-04-11 DIAGNOSIS — D573 Sickle-cell trait: Secondary | ICD-10-CM | POA: Diagnosis not present

## 2015-04-11 DIAGNOSIS — Z8249 Family history of ischemic heart disease and other diseases of the circulatory system: Secondary | ICD-10-CM | POA: Diagnosis not present

## 2015-04-11 DIAGNOSIS — I480 Paroxysmal atrial fibrillation: Secondary | ICD-10-CM | POA: Insufficient documentation

## 2015-04-11 DIAGNOSIS — I272 Other secondary pulmonary hypertension: Secondary | ICD-10-CM | POA: Insufficient documentation

## 2015-04-11 DIAGNOSIS — I509 Heart failure, unspecified: Secondary | ICD-10-CM

## 2015-04-11 DIAGNOSIS — I130 Hypertensive heart and chronic kidney disease with heart failure and stage 1 through stage 4 chronic kidney disease, or unspecified chronic kidney disease: Secondary | ICD-10-CM | POA: Insufficient documentation

## 2015-04-11 DIAGNOSIS — D649 Anemia, unspecified: Secondary | ICD-10-CM | POA: Insufficient documentation

## 2015-04-11 DIAGNOSIS — M349 Systemic sclerosis, unspecified: Secondary | ICD-10-CM | POA: Insufficient documentation

## 2015-04-11 DIAGNOSIS — I50810 Right heart failure, unspecified: Secondary | ICD-10-CM

## 2015-04-11 DIAGNOSIS — E785 Hyperlipidemia, unspecified: Secondary | ICD-10-CM | POA: Insufficient documentation

## 2015-04-11 DIAGNOSIS — E039 Hypothyroidism, unspecified: Secondary | ICD-10-CM | POA: Insufficient documentation

## 2015-04-11 LAB — BASIC METABOLIC PANEL
ANION GAP: 18 — AB (ref 5–15)
BUN: 96 mg/dL — ABNORMAL HIGH (ref 6–20)
CO2: 24 mmol/L (ref 22–32)
Calcium: 9.8 mg/dL (ref 8.9–10.3)
Chloride: 100 mmol/L — ABNORMAL LOW (ref 101–111)
Creatinine, Ser: 1.95 mg/dL — ABNORMAL HIGH (ref 0.44–1.00)
GFR calc Af Amer: 30 mL/min — ABNORMAL LOW (ref >60)
GFR calc non Af Amer: 26 mL/min — ABNORMAL LOW (ref >60)
GLUCOSE: 76 mg/dL (ref 65–99)
Potassium: 3.9 mmol/L (ref 3.5–5.1)
Sodium: 142 mmol/L (ref 135–145)

## 2015-04-11 MED ORDER — AMBRISENTAN 10 MG PO TABS: 10.0000 mg | ORAL_TABLET | Freq: Every day | ORAL | Status: DC

## 2015-04-11 NOTE — Progress Notes (Signed)
Patient ID: [name], female   DOB: 12-31-1950, 65 y.o.   MRN: [id number]    Advanced Heart Failure Clinic Note   Pulmonary: Dr Lamonte Sakai  HF: Dr Aundra Dubin  HPI: [name] is a 65 yo female with a history of DM2, HLD, HTN, pHTN, GERD, gastroparesis, scleroderma, DJD and diastolic HF.   Of note she has been on several PAH targeted therapies and coumadin over last 9 years, but has not been reliably on therapy due to noncompliance and inability to have labs performed. She has had best response to Tyvaso (+ bosentan) with an improvement in 6 minute walk and in PASP from 102 mmHg (7/09) to 42 mmHg (9/10). After stopping Tyvaso her PASP rose to 80's by TTE 6/13 and 11/14. Surprisingly, she has never required supplemental O2. Macitentan was started in 11/14. She developed nausea and diarrhea and macitentan was stopped early 3/15. Most recently, she had been on Letairis and Adcirca (followed by Dr. Lamonte Sakai).   Admitted 10/14-10/18/15 for A/C RHF, volume overload and syncope. Diuresed with IV lasix and milrinone a total of 26 lbs. Discharge weight was 156 lbs and started on lasix 40 mg BID.   Admitted 12/29 with dyspnea and chest pain. BP was low, and Lasix and Adcirca were both initially held. Echo showed EF 60-65% with moderately dilated and dysfunctional RV, PASP 111 mmHg with moderate TR. There was a large pericardial effusion without definite tamponade. Presentation was suspected to be due to progressive PAH with secondary RV failure rather than tamponade. No pericardiocentesis yet. She was started on milrinone gtt and diuresed. Shorter-acting sildenafil was started instead of Adcirca. Also had GI bleed. 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 Discharge weight was 146 pounds.   She was admitted in 2/16 with acute on chronic diastolic CHF/RV failure and hypotension.  Echo (2/16) showed EF 60%, moderate-severe RV dilation with moderate-severe RV systolic  dysfunction, PA systolic pressure 62 mmHg, large pericardial effusion with borderline tamponade.  She had pericardiocentesis with improvement in symptoms.  No malignant cells in pericardial fluid.  Repeat echo showed no pericardial effusion.  She developed paroxysmal atrial fibrillation on 2/19 but went back into NSR on amiodarone.  She was not anticoagulated given history of GI bleeding and pericardial effusion.   She was again admitted in 10/16.  She had been out of meds x 1 week and gained 20 lbs.  She also was noted to be severely anemic with iron deficiency.  She was heme negative.  She got 2 units PRBCs.  She was diuresed extensively.  She was started on Levoxyl for hypothyroidism.   Admitted In December 2016  with GI bleed. Multiple AVMs noted with 10-15 ablations completed. PAH meds continued. Diuretics cut back --torsemide 40 mg daily. Discharge weight was 138  pounds.   She returns for followup. At last visit we added metolazone. Weight down 8 lbs. SOB stable, can still only walk about 40-50 yards Also short of breath with housework or going up an incline.  No lightheadedness or dizziness. No tachypalpitations.   No orthopnea/PND.  Denies melena/BRBPR.  Taking all medications as directed, ran out of adcirca yesterday.  6 min walk (05/16/14): 125 m 6 min walk (6/16): 146 m 6 min walk (8/16): 183 m 6 min walk (1/17): 170 m (2 breaks, may have short changed her a bit - stable from last)  Echo (1/16) with EF 55-60%, severely dilated RA with moderately decreased systolic function, large  pericardial effusion.  ECHO 05/09/2014: EF 60-65% Peak PA pressure 52  ECHO 06/05/14: EF 55-60%, RV moderately dilated with moderately decreased systolic function with D-shaped interventricular septum. Severe RAE. RVSP 59. No pericardial effusion. Echo (8/16): EF 60-65%, moderate-severe RV dilation with mildly decreased RV systolic function, D-shaped interventricular septum, PASP 92, severe biatrial enlargement, no  pericardial effusion.  Echo (10/16): EF 60-65%, D-shaped septum, RV moderately dilated, PASP 76 mmHg, severe TR.  ABIs (11/16): Normal  RHC (1/16):  Hemodynamics (mmHg) RA mean 12 RV 64/14 PA 68/29, mean 42 PCWP mean 12 LV 102/14 AO 105/53 PA 67% AO 93% Cardiac Output (Fick) 6.63  Cardiac Index (Fick) 3.88  PVR 4.5 WU  Labs 03/24/2014 K 3.8 Creatinine 1.51 Hgb 9.3  Labs 2/16 BNP 121, K 4.4, creatinine 1.27, hgb 9.2 Labs 2/16 K 3.8, creatinine 1.58, AST 45, ALT 12 Labs 05/22/2014: Creatinine 2.1 K 3.7  Labs 06/05/2014: Creatinine 1.5 K 3.7, LFTs normal Labs 6/16: K 4.4, creatinine 1.82, hgb 8.8 Labs 10/16: K 4.4, creatinine 1.77 => 1.64, hgb 7.9 => 8.4, LFTs normal, Mg 1.6 Labs 01/21/2015: K 3.8 Creatinine 1.67  Labs 02/2015: K 4.3 Creatinine 1.72 => 2.4 Hgb 8.5    ROS: All systems negative except as listed in HPI, PMH and Problem List.  SH:  Social History   Social History  . Marital Status: Legally Separated    Spouse Name: N/A  . Number of Children: 2  . Years of Education: 11   Occupational History  . Umemployed   . Disability     Social History Main Topics  . Smoking status: Never Smoker   . Smokeless tobacco: Never Used  . Alcohol Use: No  . Drug Use: No  . Sexual Activity: No   Other Topics Concern  . Not on file   Social History Narrative    FAMILY HISTORY:  Significant for coronary artery disease and diabetes   Patient lives at home with granddaughter.    Patient has 2 children.    Patient has 11 years of education.    Patient is on disability.    Patient is right handed.    Patient is separated.      FH:  Family History  Problem Relation Age of Onset  . Heart disease Mother   . Diabetes Mother   . Diabetes Sister     Past Medical History  Diagnosis Date  . Secondary pulmonary hypertension ([place])     a. 2/2 scleroderma  . GERD (gastroesophageal reflux disease)   . Systemic sclerosis ([place])   . Unspecified essential hypertension   .  Gastroparesis   . Sickle cell trait ([place])   . Obesity   . Scleroderma ([place])   . Trichomonas   . Peripheral neuropathy ([place])   . Type II diabetes mellitus ([facility])     a. diet control   . Arthritis   . Chronic kidney disease   . Gout   . Hypothyroidism     a. may be due to amiodarone use. started on synthroid on 12/2014 admission   . Anemia     a.  hx of GI bleed and duodenal AVMs  . Chronic diastolic CHF (congestive heart failure) ([place])     a. Echo 12/23/14 withEF 60-65%, moderately dilated RV, PASP 72, no pericardial effusion.  Marland Kitchen PAF (paroxysmal atrial fibrillation) ([facility])     a. not a long term AC candidate due to GI bleeds  . Angiodysplasia of colon with hemorrhage  Current Outpatient Prescriptions  Medication Sig Dispense Refill  . ADCIRCA 20 MG TABS TAKE 2 TABLETS (40MG) BY MOUTH ONCE DAILY WITH OR WITHOUT FOOD. CALL [phone] FOR REFILLS. 60 tablet 11  . ambrisentan (LETAIRIS) 10 MG tablet Take 1 tablet (10 mg total) by mouth daily. 30 tablet 11  . amiodarone (PACERONE) 200 MG tablet Take 1 tablet (200 mg total) by mouth daily. 30 tablet 6  . esomeprazole (NEXIUM) 40 MG capsule Take 40 mg by mouth 2 (two) times daily.     . fenofibrate 54 MG tablet Take 1 tablet (54 mg total) by mouth daily. 30 tablet 12  . FLUoxetine (PROZAC) 20 MG capsule take 1 capsule by mouth every morning 30 capsule 2  . HYDROcodone-acetaminophen (NORCO) 10-325 MG per tablet Take 1 tablet by mouth every 6 (six) hours as needed for moderate pain.     Marland Kitchen levothyroxine (SYNTHROID, LEVOTHROID) 25 MCG tablet Take 1 tablet (25 mcg total) by mouth daily before breakfast. 30 tablet 11  . metolazone (ZAROXOLYN) 2.5 MG tablet Take 1 tablet (2.5 mg total) by mouth 2 (two) times a week. (Tuesdays and Fridays) 20 tablet 3  . nortriptyline (PAMELOR) 10 MG capsule Take 2 capsules (20 mg total) by mouth at bedtime. 60 capsule 8  . potassium chloride SA (K-DUR,KLOR-CON) 10 MEQ tablet Take 2 tablets (20 mEq total) by  mouth daily. Take additional 20 meQ with metolazone doses 120 tablet 2  . pregabalin (LYRICA) 150 MG capsule Take 1 capsule (150 mg total) by mouth 3 (three) times daily. 90 capsule 5  . Selexipag (UPTRAVI) 1600 MCG TABS Take 1,600 mcg by mouth 2 (two) times daily. 60 tablet 3  . torsemide (DEMADEX) 20 MG tablet Take 60 mg by mouth 2 (two) times daily.     No current facility-administered medications for this encounter.    Filed Vitals:   04/11/15 1045  BP: 100/56  Pulse: 69  Weight: 146 lb (66.225 kg)  SpO2: 97%   Wt Readings from Last 3 Encounters:  04/11/15 146 lb (66.225 kg)  03/27/15 154 lb 8 oz (70.081 kg)  03/19/15 154 lb 12.8 oz (70.217 kg)     PHYSICAL EXAM: General: Elderly and fatigued appearing. Sister present.  Neck: JVP 6-7 cm with mild HJR. No thyromegaly or thyroid nodule. Lungs: diminished, bilateral crackles in bases. CV: RV heave. Heart regular S1/S2 with widely split S2,  2/6 HSM LLSB. R and LLE 1+ edema to knees.   Unable to palpate pedal pulses.  Abdomen: soft, NT, ND, no HSM. No bruits or masses. +BS  Neurologic: Alert and oriented x 3.  Psych: Affect flat. Extremities: No clubbing or cyanosis.   ASSESSMENT & PLAN: 1. 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  Echo 10/16  EF 60-65% with moderate dilated and mildly dysfunctional RV, severe TR. Pericardial effusion has resolved. NYHA class III symptoms.  Volume status much improved with addition of metolazone.  - Continue torsemide 60 mg bid.  - Continue metolazone 2.5 mg 30 minutes before the am torsemide dose on Tuesday and Friday.  - Take an extra KCl 20 on metolazone days.  - BMET/BNP today.  2. PAH with cor pulmonale: Severe PAH.  Had last [place] in 1/16. Pulmonary pressure remains elevated by echo in 10/16. - Continue ambrisentan 10 mg daily.  - Continue selexipag 1600 mcg bid.  - Continue Adcirca 40 mg daily => however, given ongoing symptoms with RV failure and  persistently elevated PA  pressure, will work on getting approval for riociguat to replace Adcirca.  - 6 minute walk today => generally stable. 3. CKD stage III:  - Repeat BMET today with metolazone added last week.  4. 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 She is no longer anticoagulated.  - Hgb stable at 8.8 03/27/15 5. Pericardial effusion: Resolved after pericardiocentesis, not seen on echo 12/2014.   6. Atrial fibrillation: Regular rhythm. Paroxysmal.  - Continue amiodarone.  - No anticoagulation with h/o GI bleeding.  - LFTs/TSH OK 04/11/15. She will need a yearly eye exam while on amiodarone. 7.  Hypothyroidism: May be related to amiodarone.   - On Levoxyl 25 mcg daily.   - TSH 04/11/15 8. Anemia:  - Stable on last check.  Shirley Friar, PA-C 04/11/2015   Patient seen with PA, agree with the above note.  Volume better with increased diuresis, continue current regimen but check BMET today.  6 minute walk is stable but shows significant functional impairment.  I will work on getting her riociguat to replace Adcirca to see if this can improve symptoms.   [name] 04/12/2015

## 2015-04-11 NOTE — Patient Instructions (Signed)
Lab today  Continue Adcirca for now, once insurance approved Adempas you will be switched  Your physician recommends that you schedule a follow-up appointment in: 1 month

## 2015-04-14 ENCOUNTER — Telehealth (HOSPITAL_COMMUNITY): Payer: Self-pay

## 2015-04-14 NOTE — Telephone Encounter (Signed)
Letaris prescribed 1/27 Patient said her mail pharmacy said her insurance company would not cover Routed to [place] for assistance

## 2015-04-15 ENCOUNTER — Telehealth (HOSPITAL_COMMUNITY): Payer: Self-pay | Admitting: *Deleted

## 2015-04-15 NOTE — Telephone Encounter (Signed)
Letairis approved

## 2015-04-15 NOTE — Telephone Encounter (Signed)
Completed PA for pt's Letairis through [employer], med was approved for 6 months only, through 10/12/15.  [place] aware and state there is an $8 copay.  Pt aware as well

## 2015-04-18 ENCOUNTER — Ambulatory Visit (INDEPENDENT_AMBULATORY_CARE_PROVIDER_SITE_OTHER): Payer: Medicare Other | Admitting: Emergency Medicine

## 2015-04-18 ENCOUNTER — Encounter: Payer: Self-pay | Admitting: Emergency Medicine

## 2015-04-18 VITALS — BP 102/52 | HR 67 | Ht 64.0 in | Wt 141.0 lb

## 2015-04-18 DIAGNOSIS — M349 Systemic sclerosis, unspecified: Secondary | ICD-10-CM | POA: Diagnosis not present

## 2015-04-18 DIAGNOSIS — I272 Other secondary pulmonary hypertension: Secondary | ICD-10-CM

## 2015-04-18 DIAGNOSIS — I2721 Secondary pulmonary arterial hypertension: Secondary | ICD-10-CM

## 2015-04-18 NOTE — Assessment & Plan Note (Signed)
All of her diuretics and pulmonary hypertension medications reviewed with her today. She is currently on Letairis, Adcirca, selexipag. She no longer uses Tyvaso. There is some discussion about possibly changing her Adcirca to riociguat. She did not desaturate on her last 6 minute walk. She has not been able to tolerate anticoagulation due to GI bleeding.

## 2015-04-18 NOTE — Patient Instructions (Signed)
Please continue your current medications as you have been taking them Follow with cardiology as planned to consider changing Adcirca to an alternative medication.  Follow with Dr Lamonte Sakai in 6 months or sooner if you have any problems

## 2015-04-18 NOTE — Progress Notes (Signed)
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 As outlined above she has been on multiple medications at different times. Her medications have been reinitiated after stopping Tyvaso back in 2012. She hospitalized beginning of this year for decompensated PAH and right heart failure with anasarca. Her estimated arterial pressures were greater than 100 mmHg. She was started on milrinone and aggressively diuresed over extended period time. Subsequently a right heart cath showed her PA pressures improved to 68/21. She was re-hospitalized late February, required pericardiocentesis > PASP was estimated at 57mHg (a significant improvement). That hosp was complicated by GIB / AVM > her anticoagulation has been stopped.   [place] was restarted on Tyvaso which had been a successful medication for her in the past. She returns today for follow-up. She is up to 9 puffs, 4 times a day. Her breathing is better, still has exertional fatigue. She is having diarrhea, was having during the hospitalization.   ROV 04/18/15 -- follow-up visit for history of scleroderma with associated secondary pulmonary hypertension. She has been on [place] No longer on Tyvaso. Now on selexipag - unclear whether she has benefited with the addition of this. On amiodarone and torsemide plus Zaroxolyn. She saw cardiology last week and there's been some discussion about changing her adcirca to riociguat. She is not on anticoagulation due to history of GI bleeding. She states that her breathing is stable. She is glad that she does not have to do the inhaled therapy (tyvaso) anymore. She has edema "off and on", none currently. She has some exertional SOB, paces her self. Last 6 minute walk 1763mithout desaturation.     EXAM :  Filed Vitals:   04/18/15 1529  BP: 102/52  Pulse: 67  Height: _0  (1.626 m)   Weight: 141 lb (63.957 kg)  SpO2: 90%    Gen: Pleasant, chronically ill appearing  in no distress,    ENT: No lesions,  mouth clear,  oropharynx clear, no postnasal drip  Neck: No JVD, no TMG, no carotid bruits  Lungs: No use of accessory muscles, no dullness to percussion, clear without rales or rhonchi  Cardiovascular: regular, 3/6 holosystolic M with a loud S2 Musculoskeletal: No deformities, NO pretibial edema   Neuro: alert, non focal  Skin: Warm, no lesions or rashes   05/09/14 --  TTE Study Conclusions  - Left ventricle: The cavity size was normal. Systolic function was normal. The estimated ejection fraction was in the range of 60% to 65%. Wall motion was normal; there were no regional wall motion abnormalities. - Left atrium: The atrium was mildly dilated. - Right ventricle: The cavity size was moderately dilated. Wall thickness was normal. Systolic function was moderately to severely reduced. - Right atrium: The atrium was severely dilated. - Tricuspid valve: There was moderate regurgitation. - Pulmonary arteries: Systolic pressure was moderately increased. PA peak pressure: 52 mm Hg (S).  Impressions: - When compared to prior echocardiogram, pericardial fluid has resolved   PAH (pulmonary artery hypertension) (HCGraeagleAll of her diuretics and pulmonary hypertension medications reviewed with her today. She is currently on Letairis, Adcirca, selexipag. She no longer uses Tyvaso. There is some discussion about possibly changing her Adcirca to riociguat. She did not desaturate on her last 6 minute walk. She has not been able to tolerate anticoagulation due to GI bleeding.  SCLERODERMA No evidence for new findings beyond  her known pulmonary hypertension. No indication for starting prednisone at this time. I'll continue to follow her imaging for evidence of interstitial disease.

## 2015-04-18 NOTE — Assessment & Plan Note (Signed)
No evidence for new findings beyond her known pulmonary hypertension. No indication for starting prednisone at this time. I'll continue to follow her imaging for evidence of interstitial disease.

## 2015-04-23 ENCOUNTER — Encounter: Payer: Self-pay | Admitting: Internal Medicine

## 2015-04-23 ENCOUNTER — Ambulatory Visit (INDEPENDENT_AMBULATORY_CARE_PROVIDER_SITE_OTHER): Payer: Medicare Other | Admitting: Internal Medicine

## 2015-04-23 VITALS — BP 86/40 | HR 62 | Temp 98.1°F | Wt 147.1 lb

## 2015-04-23 DIAGNOSIS — I272 Other secondary pulmonary hypertension: Secondary | ICD-10-CM

## 2015-04-23 DIAGNOSIS — Q2730 Arteriovenous malformation, site unspecified: Secondary | ICD-10-CM

## 2015-04-23 DIAGNOSIS — I2721 Secondary pulmonary arterial hypertension: Secondary | ICD-10-CM

## 2015-04-23 DIAGNOSIS — E1122 Type 2 diabetes mellitus with diabetic chronic kidney disease: Secondary | ICD-10-CM

## 2015-04-23 DIAGNOSIS — Z79899 Other long term (current) drug therapy: Secondary | ICD-10-CM

## 2015-04-23 DIAGNOSIS — N183 Chronic kidney disease, stage 3 unspecified: Secondary | ICD-10-CM

## 2015-04-23 DIAGNOSIS — I480 Paroxysmal atrial fibrillation: Secondary | ICD-10-CM

## 2015-04-23 DIAGNOSIS — E781 Pure hyperglyceridemia: Secondary | ICD-10-CM | POA: Diagnosis not present

## 2015-04-23 DIAGNOSIS — I5032 Chronic diastolic (congestive) heart failure: Secondary | ICD-10-CM

## 2015-04-23 DIAGNOSIS — E032 Hypothyroidism due to medicaments and other exogenous substances: Secondary | ICD-10-CM | POA: Diagnosis not present

## 2015-04-23 DIAGNOSIS — D509 Iron deficiency anemia, unspecified: Secondary | ICD-10-CM | POA: Diagnosis not present

## 2015-04-23 DIAGNOSIS — I130 Hypertensive heart and chronic kidney disease with heart failure and stage 1 through stage 4 chronic kidney disease, or unspecified chronic kidney disease: Secondary | ICD-10-CM

## 2015-04-23 DIAGNOSIS — I10 Essential (primary) hypertension: Secondary | ICD-10-CM

## 2015-04-23 DIAGNOSIS — E1142 Type 2 diabetes mellitus with diabetic polyneuropathy: Secondary | ICD-10-CM

## 2015-04-23 DIAGNOSIS — E039 Hypothyroidism, unspecified: Secondary | ICD-10-CM

## 2015-04-23 LAB — GLUCOSE, CAPILLARY: GLUCOSE-CAPILLARY: 72 mg/dL (ref 65–99)

## 2015-04-23 LAB — POCT GLYCOSYLATED HEMOGLOBIN (HGB A1C): Hemoglobin A1C: 5

## 2015-04-23 NOTE — Progress Notes (Signed)
   Subjective:    Patient ID: [name], female    DOB: February 05, 1951, 65 y.o.   MRN: [id number]  CC: 3 month follow up for DM  HPI  [name] is a 65yo woman with PMH of chronic diastolic CHF, pHTN, scleroderma, AVM with GI bleeding on anticoagulation, DM2 not on any medications, HTN with concominant Hypotension due to pulmonary HTN, paroxysmal Afib, hypothyroidism who presents for routine follow up.   [name] is well followed in Pulmonology and Cardiology for her pulmonary HTN and chronic CHF.   Doing well, occasional hip pain, none today.  Is not interested in further work up as she feels fine today.  Sister notes she is not always using her walker and/or cane and she feels this may make her symptoms worse.  She has neuropathy for which she takes nortriptyline and pregabalin which help somewhat.    She reports taking medications, no change in pHTN symptoms.  She denies worsening SOB, orthopnea, LE edema. She does have ascites, but she notes that this is stable.  We discussed return precautions if she were to worsen.  She does report one episode of chest pain which came on while she was lying in bed, lasted a few minutes and then resolved on its own.  It was dull to sharp and over her left chest.  She is not sure if it was related to her heart.  No further symptoms noted.    She has a history of GI bleeding and was in the hospital last year for this issue due to AVMs.  She reports no blood loss, no hematemesis, no SOB or increased dizziness. She does have occasional lightheadedness upon standing that resolves with time, no falls.    Not smoking.  Did not bring in medications.   Review of Systems  Constitutional: Positive for fatigue. Negative for fever and chills.  Respiratory: Negative for cough and shortness of breath.   Cardiovascular: Positive for chest pain. Negative for palpitations and leg swelling.  Gastrointestinal: Positive for abdominal distention. Negative for abdominal  pain, blood in stool and anal bleeding.  Musculoskeletal: Positive for arthralgias (hip).  Skin: Negative for color change and pallor.  Neurological: Positive for light-headedness (with standing, better with waiting to walk a few minutes).       Objective:   Physical Exam  Constitutional: She is oriented to person, place, and time.  Thin woman, appears chronically ill.   HENT:  Head: Normocephalic and atraumatic.  Mouth/Throat: Oropharynx is clear and moist.  Eyes: Conjunctivae are normal. No scleral icterus.  Neck: Neck supple. No JVD present.  Cardiovascular: Normal rate, regular rhythm, normal heart sounds and intact distal pulses.   No murmur heard. Pulmonary/Chest: Effort normal and breath sounds normal. No respiratory distress. She has no wheezes.  Abdominal: Bowel sounds are normal. She exhibits distension. There is no tenderness.  Musculoskeletal: She exhibits no edema or tenderness.  Foot exam showed no wounds, intact pulses, no calluses.   Neurological: She is alert and oriented to person, place, and time.  Psychiatric: She has a normal mood and affect. Her behavior is normal.    CBC, LFTs, TFTs and lipid profile today.       Assessment & Plan:  RTC in 4 months, sooner if needed

## 2015-04-23 NOTE — Patient Instructions (Signed)
[name] - -   It was a pleasure to see you today.   For your pulmonary HTN, please continue the medications you are taking.    We will be checking some blood work today.  I will call you if we find anything abnormal.    If you have any further episodes of chest pain, please call the clinic.    Please call the clinic or come to the ED if you have any increased shortness of breath, falls, chest pain, increased swelling in your legs or belly or you notice any wounds.  Also, if you notice any blood loss, come to the ED immediately.   Thank you!  Please make an appointment with me in about 4 months.

## 2015-04-24 LAB — CBC
Hematocrit: 31.2 % — ABNORMAL LOW (ref 34.0–46.6)
Hemoglobin: 9.9 g/dL — ABNORMAL LOW (ref 11.1–15.9)
MCH: 26.1 pg — ABNORMAL LOW (ref 26.6–33.0)
MCHC: 31.7 g/dL (ref 31.5–35.7)
MCV: 82 fL (ref 79–97)
PLATELETS: 163 10*3/uL (ref 150–379)
RBC: 3.8 x10E6/uL (ref 3.77–5.28)
RDW: 16 % — ABNORMAL HIGH (ref 12.3–15.4)
WBC: 5.3 10*3/uL (ref 3.4–10.8)

## 2015-04-24 LAB — LIPID PANEL
CHOLESTEROL TOTAL: 117 mg/dL (ref 100–199)
Chol/HDL Ratio: 2.8 ratio units (ref 0.0–4.4)
HDL: 42 mg/dL (ref >39)
LDL Calculated: 60 mg/dL (ref 0–99)
TRIGLYCERIDES: 75 mg/dL (ref 0–149)
VLDL CHOLESTEROL CAL: 15 mg/dL (ref 5–40)

## 2015-04-24 LAB — HEPATIC FUNCTION PANEL
ALK PHOS: 137 IU/L — AB (ref 39–117)
ALT: 8 IU/L (ref 0–32)
AST: 19 IU/L (ref 0–40)
Albumin: 4.1 g/dL (ref 3.6–4.8)
Bilirubin Total: 0.3 mg/dL (ref 0.0–1.2)
Bilirubin, Direct: 0.14 mg/dL (ref 0.00–0.40)
Total Protein: 7.8 g/dL (ref 6.0–8.5)

## 2015-04-24 LAB — T4, FREE: FREE T4: 1.45 ng/dL (ref 0.82–1.77)

## 2015-04-24 LAB — TSH: TSH: 7.23 u[IU]/mL — AB (ref 0.450–4.500)

## 2015-04-24 NOTE — Assessment & Plan Note (Signed)
HR well controlled today in the 60s, she sounded to be in sinus rhythm on exam.  She is not on AC due to chronic AVMs and blood loss.   Continue amiodarone Check TFTs, LFTs today.   Follow up with Cardiology as scheduled.

## 2015-04-24 NOTE — Assessment & Plan Note (Signed)
She has recently been admitted for acute GI bleeding due to AVMs which are apparent throughout her GI tract.  She is not on anticoagulation due to this issue. Her Hgb has been stable over the last month and was being checked weekly at home.   Check CBC today If Hgb not improving, discuss low dose iron supplementation

## 2015-04-24 NOTE — Assessment & Plan Note (Signed)
She is on amiodarone for Afib which is thought to be the culprit of her hypothyroidism.  She has fatigue today, but that could be explained by other factors as well.  She wears a wig, but does not report any changes in skin or hair texture.   TFTs today Continue synthroid at current dose unless TFTs grossly abnormal.

## 2015-04-24 NOTE — Assessment & Plan Note (Signed)
Followed closely by pulmonary.  She is on Letairis, [place], site and selexipag.  She cannot tolerate AC due to GI Bleeding.  Continue to monitor for changes in symptoms.

## 2015-04-24 NOTE — Assessment & Plan Note (Signed)
Check lipid panel today  Continue fenofibrate

## 2015-04-24 NOTE — Assessment & Plan Note (Signed)
She has diabetes mellitus which is currently diet controlled.  Her A1C today was 5.0.  She does have significant neuropathy from this disease and has an abnormal monofilament exam today with no sensation to monofilament. She has pulses which are palpable, however.  She takes lyrica and nortriptyline with good results.  She is not on any hypoglycemic medications.  The last documented abnormal A1C was briefly in 2013.  She had eye examination last year which showed no DR.  She does have CKD which is likely associated with her DM and HTN/CHF.    Continue to monitor A1C twice yearly Check lipid panel today Continue medications for neuropathy.  Eye exam in 6 months.

## 2015-04-24 NOTE — Assessment & Plan Note (Signed)
Due to her severe heart failure and diuresis, she has not required anti-hypertensives recently and she is in fact hypotensive today.  She has mild-moderate symptoms of orthostasis, but has not had any falls and knows to get up slowly from a lying or seated position.   Continue lifestyle modification and diuresis as tolerated.

## 2015-04-24 NOTE — Assessment & Plan Note (Addendum)
[name] has pHTN, chronic diastolic heart failure from scleroderma.  Her symptoms today are well controlled.  She has no SOB at baseline.  She has mild ascites.  She is followed in both pulmonary and cardiology.   We discussed her symptoms, management at home and reasons to return to the hospital if she should worsen in her symptoms.   Continue metolazone and torsemide with KCl

## 2015-04-25 ENCOUNTER — Ambulatory Visit: Payer: Medicare Other | Admitting: Hematology and Oncology

## 2015-04-25 ENCOUNTER — Other Ambulatory Visit: Payer: Medicare Other

## 2015-04-25 ENCOUNTER — Other Ambulatory Visit (HOSPITAL_COMMUNITY): Payer: Medicare Other

## 2015-05-01 ENCOUNTER — Telehealth (HOSPITAL_COMMUNITY): Payer: Self-pay | Admitting: *Deleted

## 2015-05-01 NOTE — Telephone Encounter (Signed)
Completed PA for Adempas, med was approved through [employer] until 10/23/15.  Spoke w/pt she states she has been contacted about shipment but has not received medication yet.  Advised once she starts Adempas she will need to stop Adcirca, she is agreeable and verbalizes understanding.  I will f/u with her next week to make sure received shipment

## 2015-05-01 NOTE — Assessment & Plan Note (Signed)
Acute on chronic severe iron deficiency anemia ( unresponsive to oral iron therapy) :  Hospitalization  02/20/2015 to 02/26/2015 (Hb 5.5 angiodysplasia of colon with hemorrhage , CHF due to PAH, IDA, DM2, CKD-3) 5 Units of PRBC given.  Colonoscopy 02/24/2015: numerous AVMs  Undergone ablation  Prior IV iron treatments: 05/04/2014 2, 12/24/2014 2, 01/29/2015 1, 02/25/2015 1  Plan: 1.  Check CBC and Iron studies every 2 months and transfuse IV iron the ferritin is less than 10 2.  Follow-up with GI and cardiology   Return to clinic in 2 months for follow-up with labs ahead of time

## 2015-05-02 ENCOUNTER — Telehealth: Payer: Self-pay | Admitting: Emergency Medicine

## 2015-05-02 ENCOUNTER — Other Ambulatory Visit (HOSPITAL_BASED_OUTPATIENT_CLINIC_OR_DEPARTMENT_OTHER): Payer: Medicare Other

## 2015-05-02 ENCOUNTER — Ambulatory Visit (HOSPITAL_BASED_OUTPATIENT_CLINIC_OR_DEPARTMENT_OTHER): Payer: Medicare Other

## 2015-05-02 ENCOUNTER — Ambulatory Visit (HOSPITAL_BASED_OUTPATIENT_CLINIC_OR_DEPARTMENT_OTHER): Payer: Medicare Other | Admitting: Hematology and Oncology

## 2015-05-02 VITALS — BP 95/43 | HR 70 | Resp 18

## 2015-05-02 VITALS — BP 95/47 | HR 65 | Temp 97.4°F | Resp 18 | Ht 64.0 in | Wt 148.6 lb

## 2015-05-02 DIAGNOSIS — D509 Iron deficiency anemia, unspecified: Secondary | ICD-10-CM

## 2015-05-02 DIAGNOSIS — D500 Iron deficiency anemia secondary to blood loss (chronic): Secondary | ICD-10-CM

## 2015-05-02 LAB — CBC & DIFF AND RETIC
BASO%: 0.2 % (ref 0.0–2.0)
BASOS ABS: 0 10*3/uL (ref 0.0–0.1)
EOS%: 2.6 % (ref 0.0–7.0)
Eosinophils Absolute: 0.1 10*3/uL (ref 0.0–0.5)
HEMATOCRIT: 29.2 % — AB (ref 34.8–46.6)
HGB: 9.6 g/dL — ABNORMAL LOW (ref 11.6–15.9)
IMMATURE RETIC FRACT: 6.6 % (ref 1.60–10.00)
LYMPH#: 1.5 10*3/uL (ref 0.9–3.3)
LYMPH%: 28 % (ref 14.0–49.7)
MCH: 26.2 pg (ref 25.1–34.0)
MCHC: 32.9 g/dL (ref 31.5–36.0)
MCV: 79.6 fL (ref 79.5–101.0)
MONO#: 0.6 10*3/uL (ref 0.1–0.9)
MONO%: 10.4 % (ref 0.0–14.0)
NEUT#: 3.1 10*3/uL (ref 1.5–6.5)
NEUT%: 58.8 % (ref 38.4–76.8)
PLATELETS: 151 10*3/uL (ref 145–400)
RBC: 3.67 10*6/uL — ABNORMAL LOW (ref 3.70–5.45)
RDW: 15.9 % — ABNORMAL HIGH (ref 11.2–14.5)
RETIC CT ABS: 45.51 10*3/uL (ref 33.70–90.70)
Retic %: 1.24 % (ref 0.70–2.10)
WBC: 5.3 10*3/uL (ref 3.9–10.3)

## 2015-05-02 LAB — IRON AND TIBC
%SAT: 8 % — ABNORMAL LOW (ref 21–57)
Iron: 24 ug/dL — ABNORMAL LOW (ref 41–142)
TIBC: 306 ug/dL (ref 236–444)
UIBC: 282 ug/dL (ref 120–384)

## 2015-05-02 LAB — FERRITIN: Ferritin: 96 ng/ml (ref 9–269)

## 2015-05-02 MED ORDER — SODIUM CHLORIDE 0.9 % IV SOLN
510.0000 mg | Freq: Once | INTRAVENOUS | Status: AC
  Administered 2015-05-02: 510 mg via INTRAVENOUS
  Filled 2015-05-02: qty 17

## 2015-05-02 MED ORDER — SODIUM CHLORIDE 0.9 % IV SOLN
Freq: Once | INTRAVENOUS | Status: AC
  Administered 2015-05-02: 12:00:00 via INTRAVENOUS

## 2015-05-02 NOTE — Telephone Encounter (Signed)
?  per 05/01/15 phone note pt was started on Adempas through cardiology, not our office.  No change in meds made at last ov with RB on 04/18/15.   Lmtcb for [place] at [employer] .

## 2015-05-02 NOTE — Progress Notes (Signed)
Unable to get in to exam room prior to MD.  No assessment performed.

## 2015-05-02 NOTE — Progress Notes (Signed)
Patient Care Team: Sid Falcon, MD as PCP - General (Internal Medicine) Michel Bickers, MD as Consulting Physician (Infectious Diseases) Dyke Maes, OD (Optometry)  DIAGNOSIS: chronic blood loss related deficiency anemia.  CHIEF COMPLIANT: severe fatigue  INTERVAL HISTORY: [name] is a 65 year old with above-mentioned history of chronic blood loss related iron deficiency anemia. She was admitted to the hospital in December with acute GI bleed and undergone ablation for AV malformations. She also received 4 units of blood and 1 dose of IV Feraheme 510 mg. She feels extremely tired and weak and she feels that she can go to sleep right now. She does not complain of any further GI bleeding.  REVIEW OF SYSTEMS:   Constitutional: Denies fevers, chills or abnormal weight loss Eyes: Denies blurriness of vision Ears, nose, mouth, throat, and face: Denies mucositis or sore throat Respiratory: Denies cough, dyspnea or wheezes Cardiovascular: Denies palpitation, chest discomfort Gastrointestinal:  Denies nausea, heartburn or change in bowel habits Skin: Denies abnormal skin rashes Lymphatics: Denies new lymphadenopathy or easy bruising Neurological:Denies numbness, tingling or new weaknesses Behavioral/Psych: Mood is stable, no new changes  Extremities: No lower extremity edema All other systems were reviewed with the patient and are negative.  I have reviewed the past medical history, past surgical history, social history and family history with the patient and they are unchanged from previous note.  ALLERGIES:  is allergic to cephalexin; ciprofloxacin; codeine; contrast media; and iohexol.  MEDICATIONS:  Current Outpatient Prescriptions  Medication Sig Dispense Refill  . ADCIRCA 20 MG TABS TAKE 2 TABLETS (40MG) BY MOUTH ONCE DAILY WITH OR WITHOUT FOOD. CALL [phone] FOR REFILLS. 60 tablet 11  . ambrisentan (LETAIRIS) 10 MG tablet Take 1 tablet (10 mg total) by mouth daily.  30 tablet 11  . amiodarone (PACERONE) 200 MG tablet Take 1 tablet (200 mg total) by mouth daily. 30 tablet 6  . esomeprazole (NEXIUM) 40 MG capsule Take 40 mg by mouth 2 (two) times daily.     . fenofibrate 54 MG tablet Take 1 tablet (54 mg total) by mouth daily. 30 tablet 12  . FLUoxetine (PROZAC) 20 MG capsule take 1 capsule by mouth every morning 30 capsule 2  . HYDROcodone-acetaminophen (NORCO) 10-325 MG per tablet Take 1 tablet by mouth every 6 (six) hours as needed for moderate pain.     Marland Kitchen levothyroxine (SYNTHROID, LEVOTHROID) 25 MCG tablet Take 1 tablet (25 mcg total) by mouth daily before breakfast. 30 tablet 11  . metolazone (ZAROXOLYN) 2.5 MG tablet Take 1 tablet (2.5 mg total) by mouth 2 (two) times a week. (Tuesdays and Fridays) 20 tablet 3  . nortriptyline (PAMELOR) 10 MG capsule Take 2 capsules (20 mg total) by mouth at bedtime. 60 capsule 8  . potassium chloride SA (K-DUR,KLOR-CON) 10 MEQ tablet Take 2 tablets (20 mEq total) by mouth daily. Take additional 20 meQ with metolazone doses 120 tablet 2  . pregabalin (LYRICA) 150 MG capsule Take 1 capsule (150 mg total) by mouth 3 (three) times daily. 90 capsule 5  . Selexipag (UPTRAVI) 1600 MCG TABS Take 1,600 mcg by mouth 2 (two) times daily. 60 tablet 3  . torsemide (DEMADEX) 20 MG tablet Take 60 mg by mouth 2 (two) times daily.     No current facility-administered medications for this visit.    PHYSICAL EXAMINATION: ECOG PERFORMANCE STATUS: 2 - Symptomatic, <50% confined to bed  Filed Vitals:   05/02/15 1121  BP: 95/47  Pulse: 65  Temp: 97.4 F (  36.3 C)  Resp: 18   Filed Weights   05/02/15 1121  Weight: 148 lb 9.6 oz (67.405 kg)    GENERAL:alert, no distress and comfortable SKIN: skin color, texture, turgor are normal, no rashes or significant lesions EYES: normal, Conjunctiva are pink and non-injected, sclera clear OROPHARYNX:no exudate, no erythema and lips, buccal mucosa, and tongue normal  NECK: supple, thyroid  normal size, non-tender, without nodularity LYMPH:  no palpable lymphadenopathy in the cervical, axillary or inguinal LUNGS: clear to auscultation and percussion with normal breathing effort HEART: regular rate & rhythm and no murmurs and no lower extremity edema ABDOMEN:abdomen soft, non-tender and normal bowel sounds MUSCULOSKELETAL:no cyanosis of digits and no clubbing  NEURO: alert & oriented x 3 with fluent speech, no focal motor/sensory deficits EXTREMITIES: No lower extremity edema  LABORATORY DATA:  I have reviewed the data as listed   Chemistry      Component Value Date/Time   NA 142 04/11/2015 1218   K 3.9 04/11/2015 1218   CL 100* 04/11/2015 1218   CO2 24 04/11/2015 1218   BUN 96* 04/11/2015 1218   CREATININE 1.95* 04/11/2015 1218   CREATININE 1.82* 09/04/2014 1145      Component Value Date/Time   CALCIUM 9.8 04/11/2015 1218   ALKPHOS 137* 04/23/2015 1049   AST 19 04/23/2015 1049   ALT 8 04/23/2015 1049   BILITOT 0.3 04/23/2015 1049   BILITOT 0.6 03/27/2015 1617       Lab Results  Component Value Date   WBC 5.3 05/02/2015   HGB 9.6* 05/02/2015   HCT 29.2* 05/02/2015   MCV 79.6 05/02/2015   PLT 151 05/02/2015   NEUTROABS 3.1 05/02/2015     ASSESSMENT & PLAN:  Iron deficiency anemia  Acute on chronic severe iron deficiency anemia ( unresponsive to oral iron therapy) :  Hospitalization  02/20/2015 to 02/26/2015 (Hb 5.5 angiodysplasia of colon with hemorrhage , CHF due to PAH, IDA, DM2, CKD-3) 5 Units of PRBC given.  Colonoscopy 02/24/2015: numerous AVMs  Undergone ablation  Prior IV iron treatments: 05/04/2014 2, 12/24/2014 2, 01/29/2015 2, 02/25/2015 1  Plan: 1.  CBC and Iron studies checked today and I recommended to transfuse IV iron feraheme 510 mg. She has transportation issues and cannot come for the second dose. 2.  Follow-up with GI and cardiology  Return to clinic in 2 months for follow-up with labs.  No orders of the defined types were  placed in this encounter.   The patient has a good understanding of the overall plan. she agrees with it. she will call with any problems that may develop before the next visit here.   Rulon Eisenmenger, MD 05/03/2015

## 2015-05-02 NOTE — Patient Instructions (Signed)

## 2015-05-03 ENCOUNTER — Encounter: Payer: Self-pay | Admitting: Hematology and Oncology

## 2015-05-05 NOTE — Telephone Encounter (Signed)
Called spoke w/ [place] with [facility]. Made her aware this was done by cardiology and needs to contact them.

## 2015-05-06 ENCOUNTER — Telehealth: Payer: Self-pay | Admitting: Hematology and Oncology

## 2015-05-06 NOTE — Telephone Encounter (Signed)
Left message for patient to inform her of appt date/time made per MD 2/17 pof.

## 2015-05-08 ENCOUNTER — Telehealth: Payer: Self-pay | Admitting: Cardiology

## 2015-05-08 NOTE — Telephone Encounter (Signed)
Please press Option 2 and next Option 1. They are trying to start her on Adempas,pt says her blood pressure usually runs low. He just want to make sure doctor is aware of this,medicine will make her blood pressure lower. He wants to make sure doctor keeps an eye on this patient.

## 2015-05-08 NOTE — Telephone Encounter (Signed)
Spoke w/pharmacist at [facility] regarding pt.  Advised her that this pt is seen in heart failure and would forward message to them.  Also gave her HF clinic phone number.

## 2015-05-09 ENCOUNTER — Ambulatory Visit (HOSPITAL_BASED_OUTPATIENT_CLINIC_OR_DEPARTMENT_OTHER): Payer: Medicare Other

## 2015-05-09 VITALS — BP 110/48 | HR 70 | Temp 98.1°F | Resp 18

## 2015-05-09 DIAGNOSIS — D500 Iron deficiency anemia secondary to blood loss (chronic): Secondary | ICD-10-CM | POA: Diagnosis not present

## 2015-05-09 DIAGNOSIS — D509 Iron deficiency anemia, unspecified: Secondary | ICD-10-CM

## 2015-05-09 MED ORDER — SODIUM CHLORIDE 0.9 % IV SOLN
Freq: Once | INTRAVENOUS | Status: AC
  Administered 2015-05-09: 15:00:00 via INTRAVENOUS

## 2015-05-09 MED ORDER — SODIUM CHLORIDE 0.9 % IV SOLN
510.0000 mg | Freq: Once | INTRAVENOUS | Status: AC
  Administered 2015-05-09: 510 mg via INTRAVENOUS
  Filled 2015-05-09: qty 17

## 2015-05-09 NOTE — Progress Notes (Signed)
Pt tolerated iv feraheme wo any issues. Pt refused to stay for 30 min post observation. Pt states that she has to go. Pt vs stable and asymptomatic. Pt persistent on leaving after iron was done. Discharge in stable condition. AVS printed for pt.

## 2015-05-09 NOTE — Patient Instructions (Signed)

## 2015-05-12 ENCOUNTER — Inpatient Hospital Stay (HOSPITAL_COMMUNITY): Payer: Medicare Other

## 2015-05-12 ENCOUNTER — Other Ambulatory Visit: Payer: Self-pay

## 2015-05-12 ENCOUNTER — Inpatient Hospital Stay (HOSPITAL_COMMUNITY): Admission: RE | Admit: 2015-05-12 | Payer: Medicare Other | Source: Ambulatory Visit

## 2015-05-12 ENCOUNTER — Inpatient Hospital Stay (HOSPITAL_COMMUNITY)
Admission: EM | Admit: 2015-05-12 | Discharge: 2015-05-15 | DRG: 345 | Disposition: A | Discharge status: Home Health | Payer: Medicare Other | Attending: Internal Medicine | Admitting: Internal Medicine

## 2015-05-12 DIAGNOSIS — Z79899 Other long term (current) drug therapy: Secondary | ICD-10-CM

## 2015-05-12 DIAGNOSIS — K5521 Angiodysplasia of colon with hemorrhage: Principal | ICD-10-CM | POA: Insufficient documentation

## 2015-05-12 DIAGNOSIS — D500 Iron deficiency anemia secondary to blood loss (chronic): Secondary | ICD-10-CM | POA: Diagnosis not present

## 2015-05-12 DIAGNOSIS — I959 Hypotension, unspecified: Secondary | ICD-10-CM | POA: Diagnosis present

## 2015-05-12 DIAGNOSIS — I10 Essential (primary) hypertension: Secondary | ICD-10-CM | POA: Diagnosis present

## 2015-05-12 DIAGNOSIS — Z96652 Presence of left artificial knee joint: Secondary | ICD-10-CM | POA: Diagnosis not present

## 2015-05-12 DIAGNOSIS — I2721 Secondary pulmonary arterial hypertension: Secondary | ICD-10-CM | POA: Diagnosis present

## 2015-05-12 DIAGNOSIS — K5520 Angiodysplasia of colon without hemorrhage: Secondary | ICD-10-CM | POA: Diagnosis not present

## 2015-05-12 DIAGNOSIS — N183 Chronic kidney disease, stage 3 unspecified: Secondary | ICD-10-CM | POA: Diagnosis present

## 2015-05-12 DIAGNOSIS — I5032 Chronic diastolic (congestive) heart failure: Secondary | ICD-10-CM | POA: Diagnosis not present

## 2015-05-12 DIAGNOSIS — R531 Weakness: Secondary | ICD-10-CM | POA: Diagnosis not present

## 2015-05-12 DIAGNOSIS — I9589 Other hypotension: Secondary | ICD-10-CM | POA: Diagnosis not present

## 2015-05-12 DIAGNOSIS — K210 Gastro-esophageal reflux disease with esophagitis: Secondary | ICD-10-CM | POA: Diagnosis not present

## 2015-05-12 DIAGNOSIS — I480 Paroxysmal atrial fibrillation: Secondary | ICD-10-CM | POA: Diagnosis present

## 2015-05-12 DIAGNOSIS — E1142 Type 2 diabetes mellitus with diabetic polyneuropathy: Secondary | ICD-10-CM | POA: Diagnosis not present

## 2015-05-12 DIAGNOSIS — D509 Iron deficiency anemia, unspecified: Secondary | ICD-10-CM | POA: Diagnosis present

## 2015-05-12 DIAGNOSIS — R7989 Other specified abnormal findings of blood chemistry: Secondary | ICD-10-CM

## 2015-05-12 DIAGNOSIS — Q2733 Arteriovenous malformation of digestive system vessel: Secondary | ICD-10-CM | POA: Diagnosis not present

## 2015-05-12 DIAGNOSIS — I2781 Cor pulmonale (chronic): Secondary | ICD-10-CM | POA: Diagnosis present

## 2015-05-12 DIAGNOSIS — I130 Hypertensive heart and chronic kidney disease with heart failure and stage 1 through stage 4 chronic kidney disease, or unspecified chronic kidney disease: Secondary | ICD-10-CM | POA: Diagnosis not present

## 2015-05-12 DIAGNOSIS — R404 Transient alteration of awareness: Secondary | ICD-10-CM | POA: Diagnosis not present

## 2015-05-12 DIAGNOSIS — E1121 Type 2 diabetes mellitus with diabetic nephropathy: Secondary | ICD-10-CM | POA: Diagnosis present

## 2015-05-12 DIAGNOSIS — E119 Type 2 diabetes mellitus without complications: Secondary | ICD-10-CM | POA: Diagnosis not present

## 2015-05-12 DIAGNOSIS — M349 Systemic sclerosis, unspecified: Secondary | ICD-10-CM | POA: Diagnosis not present

## 2015-05-12 DIAGNOSIS — E781 Pure hyperglyceridemia: Secondary | ICD-10-CM | POA: Diagnosis present

## 2015-05-12 DIAGNOSIS — D62 Acute posthemorrhagic anemia: Secondary | ICD-10-CM | POA: Diagnosis present

## 2015-05-12 DIAGNOSIS — E872 Acidosis, unspecified: Secondary | ICD-10-CM

## 2015-05-12 DIAGNOSIS — M3481 Systemic sclerosis with lung involvement: Secondary | ICD-10-CM | POA: Diagnosis not present

## 2015-05-12 DIAGNOSIS — K921 Melena: Secondary | ICD-10-CM | POA: Diagnosis not present

## 2015-05-12 DIAGNOSIS — I272 Other secondary pulmonary hypertension: Secondary | ICD-10-CM | POA: Diagnosis present

## 2015-05-12 DIAGNOSIS — K922 Gastrointestinal hemorrhage, unspecified: Secondary | ICD-10-CM | POA: Diagnosis present

## 2015-05-12 DIAGNOSIS — R1030 Lower abdominal pain, unspecified: Secondary | ICD-10-CM | POA: Diagnosis not present

## 2015-05-12 DIAGNOSIS — E039 Hypothyroidism, unspecified: Secondary | ICD-10-CM | POA: Diagnosis present

## 2015-05-12 DIAGNOSIS — R740 Nonspecific elevation of levels of transaminase and lactic acid dehydrogenase [LDH]: Secondary | ICD-10-CM | POA: Diagnosis not present

## 2015-05-12 DIAGNOSIS — K31811 Angiodysplasia of stomach and duodenum with bleeding: Secondary | ICD-10-CM | POA: Diagnosis not present

## 2015-05-12 DIAGNOSIS — D649 Anemia, unspecified: Secondary | ICD-10-CM | POA: Diagnosis not present

## 2015-05-12 DIAGNOSIS — K219 Gastro-esophageal reflux disease without esophagitis: Secondary | ICD-10-CM | POA: Diagnosis present

## 2015-05-12 DIAGNOSIS — K3184 Gastroparesis: Secondary | ICD-10-CM | POA: Diagnosis not present

## 2015-05-12 LAB — GLUCOSE, CAPILLARY
Glucose-Capillary: 152 mg/dL — ABNORMAL HIGH (ref 65–99)
Glucose-Capillary: 45 mg/dL — ABNORMAL LOW (ref 65–99)
Glucose-Capillary: 112 mg/dL — ABNORMAL HIGH (ref 65–99)

## 2015-05-12 LAB — CBC WITH DIFFERENTIAL/PLATELET
BASOS PCT: 0 %
Basophils Absolute: 0 10*3/uL (ref 0.0–0.1)
EOS ABS: 0 10*3/uL (ref 0.0–0.7)
Eosinophils Relative: 0 %
HCT: 15.1 % — ABNORMAL LOW (ref 36.0–46.0)
Hemoglobin: 4.7 g/dL — CL (ref 12.0–15.0)
Lymphocytes Relative: 18 %
Lymphs Abs: 1.6 10*3/uL (ref 0.7–4.0)
MCH: 25.1 pg — ABNORMAL LOW (ref 26.0–34.0)
MCHC: 31.1 g/dL (ref 30.0–36.0)
MCV: 80.7 fL (ref 78.0–100.0)
MONO ABS: 0.5 10*3/uL (ref 0.1–1.0)
MONOS PCT: 5 %
Neutro Abs: 6.9 10*3/uL (ref 1.7–7.7)
Neutrophils Relative %: 76 %
Platelets: 202 10*3/uL (ref 150–400)
RBC: 1.87 MIL/uL — ABNORMAL LOW (ref 3.87–5.11)
RDW: 18 % — AB (ref 11.5–15.5)
WBC: 9 10*3/uL (ref 4.0–10.5)

## 2015-05-12 LAB — I-STAT CHEM 8, ED
BUN: 101 mg/dL — ABNORMAL HIGH (ref 6–20)
CHLORIDE: 107 mmol/L (ref 101–111)
CREATININE: 1.8 mg/dL — AB (ref 0.44–1.00)
Calcium, Ion: 1.15 mmol/L (ref 1.13–1.30)
GLUCOSE: 74 mg/dL (ref 65–99)
HCT: 18 % — ABNORMAL LOW (ref 36.0–46.0)
Hemoglobin: 6.1 g/dL — CL (ref 12.0–15.0)
POTASSIUM: 3.5 mmol/L (ref 3.5–5.1)
Sodium: 149 mmol/L — ABNORMAL HIGH (ref 135–145)
TCO2: 22 mmol/L (ref 0–100)

## 2015-05-12 LAB — COMPREHENSIVE METABOLIC PANEL
ALT: 8 U/L — ABNORMAL LOW (ref 14–54)
AST: 18 U/L (ref 15–41)
Albumin: 3.1 g/dL — ABNORMAL LOW (ref 3.5–5.0)
Alkaline Phosphatase: 71 U/L (ref 38–126)
Anion gap: 17 — ABNORMAL HIGH (ref 5–15)
BUN: 99 mg/dL — ABNORMAL HIGH (ref 6–20)
CO2: 21 mmol/L — ABNORMAL LOW (ref 22–32)
Calcium: 9 mg/dL (ref 8.9–10.3)
Chloride: 109 mmol/L (ref 101–111)
Creatinine, Ser: 1.88 mg/dL — ABNORMAL HIGH (ref 0.44–1.00)
GFR calc Af Amer: 32 mL/min — ABNORMAL LOW (ref >60)
GFR calc non Af Amer: 27 mL/min — ABNORMAL LOW (ref >60)
Glucose, Bld: 82 mg/dL (ref 65–99)
Potassium: 3.6 mmol/L (ref 3.5–5.1)
Sodium: 147 mmol/L — ABNORMAL HIGH (ref 135–145)
Total Bilirubin: 0.3 mg/dL (ref 0.3–1.2)
Total Protein: 6.6 g/dL (ref 6.5–8.1)

## 2015-05-12 LAB — POC OCCULT BLOOD, ED: Fecal Occult Bld: POSITIVE — AB

## 2015-05-12 LAB — CBC
HEMATOCRIT: 26.1 % — AB (ref 36.0–46.0)
Hemoglobin: 8.6 g/dL — ABNORMAL LOW (ref 12.0–15.0)
MCH: 27.3 pg (ref 26.0–34.0)
MCHC: 33 g/dL (ref 30.0–36.0)
MCV: 82.9 fL (ref 78.0–100.0)
PLATELETS: 185 10*3/uL (ref 150–400)
RBC: 3.15 MIL/uL — ABNORMAL LOW (ref 3.87–5.11)
RDW: 15.9 % — AB (ref 11.5–15.5)
WBC: 13.1 10*3/uL — ABNORMAL HIGH (ref 4.0–10.5)

## 2015-05-12 LAB — TROPONIN I: Troponin I: <0.03 ng/mL (ref <0.031)

## 2015-05-12 LAB — I-STAT CG4 LACTIC ACID, ED: Lactic Acid, Venous: 5.51 mmol/L (ref 0.5–2.0)

## 2015-05-12 LAB — PROTIME-INR
INR: 1.55 — AB (ref 0.00–1.49)
Prothrombin Time: 18.6 seconds — ABNORMAL HIGH (ref 11.6–15.2)

## 2015-05-12 LAB — TSH: TSH: 3.853 u[IU]/mL (ref 0.350–4.500)

## 2015-05-12 LAB — PREPARE RBC (CROSSMATCH)

## 2015-05-12 LAB — TYPE AND SCREEN
ABO/RH(D): O NEG
Antibody Screen: POSITIVE

## 2015-05-12 LAB — LACTIC ACID, PLASMA: Lactic Acid, Venous: 4.8 mmol/L (ref 0.5–2.0)

## 2015-05-12 LAB — MRSA PCR SCREENING: MRSA by PCR: NEGATIVE

## 2015-05-12 LAB — APTT: APTT: 35 s (ref 24–37)

## 2015-05-12 MED ORDER — SODIUM CHLORIDE 0.9 % IV SOLN
INTRAVENOUS | Status: DC
  Administered 2015-05-12: 17:00:00 via INTRAVENOUS
  Administered 2015-05-12: 1000 mL via INTRAVENOUS

## 2015-05-12 MED ORDER — SODIUM CHLORIDE 0.9 % IV BOLUS (SEPSIS)
1000.0000 mL | Freq: Once | INTRAVENOUS | Status: DC
Start: 2015-05-12 — End: 2015-05-15

## 2015-05-12 MED ORDER — SODIUM CHLORIDE 0.9 % IV BOLUS (SEPSIS)
1000.0000 mL | Freq: Once | INTRAVENOUS | Status: AC
  Administered 2015-05-12: 1000 mL via INTRAVENOUS

## 2015-05-12 MED ORDER — PANTOPRAZOLE SODIUM 40 MG IV SOLR
40.0000 mg | Freq: Once | INTRAVENOUS | Status: AC
  Administered 2015-05-12: 40 mg via INTRAVENOUS
  Filled 2015-05-12: qty 40

## 2015-05-12 MED ORDER — LEVOTHYROXINE SODIUM 25 MCG PO TABS
25.0000 ug | ORAL_TABLET | Freq: Every day | ORAL | Status: DC
  Administered 2015-05-13 – 2015-05-15 (×3): 25 ug via ORAL
  Filled 2015-05-12 (×3): qty 1

## 2015-05-12 MED ORDER — DEXTROSE 50 % IV SOLN
INTRAVENOUS | Status: AC
  Administered 2015-05-12: 25 mL
  Filled 2015-05-12: qty 50

## 2015-05-12 MED ORDER — SODIUM CHLORIDE 0.9 % IV BOLUS (SEPSIS): 250.0000 mL | Freq: Once | INTRAVENOUS | Status: DC

## 2015-05-12 MED ORDER — SODIUM CHLORIDE 0.9 % IV SOLN
Freq: Once | INTRAVENOUS | Status: DC
Start: 2015-05-12 — End: 2015-05-14

## 2015-05-12 MED ORDER — ACETAMINOPHEN 325 MG PO TABS
650.0000 mg | ORAL_TABLET | ORAL | Status: DC | PRN
  Administered 2015-05-13 – 2015-05-14 (×4): 650 mg via ORAL
  Filled 2015-05-12 (×4): qty 2

## 2015-05-12 MED ORDER — SODIUM CHLORIDE 0.9 % IV SOLN: 8.0000 mg/h | INTRAVENOUS | Status: DC

## 2015-05-12 MED ORDER — ONDANSETRON HCL 4 MG/2ML IJ SOLN: 4.0000 mg | Freq: Four times a day (QID) | INTRAMUSCULAR | Status: DC | PRN

## 2015-05-12 MED ORDER — PANTOPRAZOLE SODIUM 40 MG PO TBEC: 40.0000 mg | DELAYED_RELEASE_TABLET | Freq: Every day | ORAL | Status: DC

## 2015-05-12 MED ORDER — PANTOPRAZOLE SODIUM 40 MG IV SOLR: 40.0000 mg | Freq: Two times a day (BID) | INTRAVENOUS | Status: DC

## 2015-05-12 MED ORDER — SODIUM CHLORIDE 0.9 % IV BOLUS (SEPSIS)
500.0000 mL | Freq: Once | INTRAVENOUS | Status: AC
  Administered 2015-05-12: 500 mL via INTRAVENOUS

## 2015-05-12 MED ORDER — INSULIN ASPART 100 UNIT/ML ~~LOC~~ SOLN
0.0000 [IU] | SUBCUTANEOUS | Status: DC
  Administered 2015-05-12 – 2015-05-13 (×3): 0 [IU] via SUBCUTANEOUS

## 2015-05-12 MED ORDER — SODIUM CHLORIDE 0.9 % IV SOLN: INTRAVENOUS | Status: DC

## 2015-05-12 MED ORDER — SODIUM CHLORIDE 0.9 % IV SOLN: 10.0000 mL/h | Freq: Once | INTRAVENOUS | Status: DC

## 2015-05-12 MED ORDER — ONDANSETRON HCL 4 MG PO TABS: 4.0000 mg | ORAL_TABLET | Freq: Four times a day (QID) | ORAL | Status: DC | PRN

## 2015-05-12 MED ORDER — SODIUM CHLORIDE 0.9% FLUSH
3.0000 mL | Freq: Two times a day (BID) | INTRAVENOUS | Status: DC
  Administered 2015-05-12 – 2015-05-15 (×6): 3 mL via INTRAVENOUS

## 2015-05-12 MED ORDER — AMIODARONE HCL 200 MG PO TABS
200.0000 mg | ORAL_TABLET | Freq: Every day | ORAL | Status: DC
  Administered 2015-05-12 – 2015-05-15 (×4): 200 mg via ORAL
  Filled 2015-05-12 (×4): qty 1

## 2015-05-12 MED ORDER — PANTOPRAZOLE SODIUM 40 MG PO TBEC
40.0000 mg | DELAYED_RELEASE_TABLET | Freq: Every day | ORAL | Status: DC
  Administered 2015-05-13 – 2015-05-15 (×3): 40 mg via ORAL
  Filled 2015-05-12 (×3): qty 1

## 2015-05-12 MED ORDER — DEXTROSE-NACL 5-0.45 % IV SOLN
INTRAVENOUS | Status: DC
Start: 2015-05-12 — End: 2015-05-13
  Administered 2015-05-12: 1000 mL via INTRAVENOUS

## 2015-05-12 MED ORDER — TORSEMIDE 20 MG PO TABS
60.0000 mg | ORAL_TABLET | Freq: Two times a day (BID) | ORAL | Status: DC
  Filled 2015-05-12: qty 3

## 2015-05-12 NOTE — ED Notes (Signed)
Yesterday started to have dark red stools and was dizzy today went to get out of bed and was so dizzy she slipped to the floor per ems she has had 500 fluid and 4 of zofran denies sob but does has chest tightness

## 2015-05-12 NOTE — ED Notes (Signed)
[name] at bedside with pt and their family, pt to receive 1 more unit of emergency release PRBC. Blood bank aware.

## 2015-05-12 NOTE — ED Provider Notes (Signed)
CSN: [id number]     Arrival date & time 05/12/15  1301 History   First MD Initiated Contact with Patient 05/12/15 1312     Chief Complaint  Patient presents with  . GI Bleeding   History provided by patient, also from sister at bedside.  (Consider location/radiation/quality/duration/timing/severity/associated sxs/prior Treatment) HPI   Patient reports symptoms started yesterday with acute onset "dark bloody" bowel movements, seemed to get significantly worse this morning with persistent episodes, denied having bright red blood in stool or per rectum, while sitting on toilet this morning she felt lightheaded and dizzy, felt like she "almost passed out" and does not recall if she lost consciousness, denied any known head injury or fall. EMS was called, and she received 500 cc bolus and Zofran. She had some nausea vomiting today but denied any blood in her vomit. Additionally, described abdominal pain and discomfort across whole abdomen, associated with some chest discomfort.  Significant recent hospitalization 02/2015 with similar presentation of hematochezia and melenotic stools, had colonoscopy during that hospital stay by [facility] showed multiple angiodysplasia lesions, some ablated, otherwise counseled on bleeding may return.  Additionally PMH chronic diastolic CHF, pulmonary HTN, DM2, systemic sclerosis, with chronic history of hypotension.  Past Medical History  Diagnosis Date  . Secondary pulmonary hypertension ([place])     a. 2/2 scleroderma  . GERD (gastroesophageal reflux disease)   . Systemic sclerosis ([place])   . Unspecified essential hypertension   . Gastroparesis   . Sickle cell trait ([place])   . Obesity   . Scleroderma ([place])   . Trichomonas   . Peripheral neuropathy ([place])   . Type II diabetes mellitus ([facility])     a. diet control   . Arthritis   . Chronic kidney disease   . Gout   . Hypothyroidism     a. may be due to amiodarone use. started on synthroid on 12/2014 admission    . Anemia     a.  hx of GI bleed and duodenal AVMs  . Chronic diastolic CHF (congestive heart failure) ([place])     a. Echo 12/23/14 withEF 60-65%, moderately dilated RV, PASP 72, no pericardial effusion.  Marland Kitchen PAF (paroxysmal atrial fibrillation) ([facility])     a. not a long term AC candidate due to GI bleeds  . Angiodysplasia of colon with hemorrhage    Past Surgical History  Procedure Laterality Date  . Tubal ligation    . Esophagogastroduodenoscopy  02/23/2010    D 2 AVM, ablated with APC.   Marland Kitchen Shoulder arthroscopy Right 12/2009    subacromial decompression  . Replacement total knee Left 11/2000  . Cardiac catheterization Right 04/24/2004  . Tonsillectomy  1960's  . Abdominal hysterectomy  1983    "partial"  . Shoulder arthroscopy w/ rotator cuff repair Right 01/2010  . Excisional total knee arthroplasty with antibiotic spacers Left 08/2010    "got infected & had to take 1st replacement out"  . Revision total knee arthroplasty Left 11/2010    "removed spacers; replaced knee"  . Total knee revision with scar debridement/patella revision with poly exchange Left 12/2010    fell; knee split opened; had to redo revision"  . Cardiac catheterization Left 07/2010  . Peripherally inserted central catheter insertion  09/2010  . Colonoscopy  09/06/2008, 09/2013    int rrhoids:Dr. Lajoyce Corners 2010. Pan-colonic AVMs, microscopic colitis per Dr Deatra Ina 2015  . Right heart catheterization N/A 03/18/2014    Procedure: RIGHT HEART CATH;  Surgeon: Larey Dresser, MD;  Location: [facility] CATH LAB;  Service: Cardiovascular;  Laterality: N/A;  . Esophagogastroduodenoscopy N/A 03/23/2014    Procedure: ESOPHAGOGASTRODUODENOSCOPY (EGD);  Surgeon: Ladene Artist, MD;  Location: [facility] ENDOSCOPY;  Service: Endoscopy;  Laterality: N/A;  . Pericardial tap N/A 05/03/2014    Procedure: PERICARDIAL TAP;  Surgeon: Sinclair Grooms, MD;  Location: [facility] CATH LAB;  Service: Cardiovascular;  Laterality: N/A;  . Esophagogastroduodenoscopy N/A  02/21/2015    Procedure: ESOPHAGOGASTRODUODENOSCOPY (EGD);  Surgeon: Ladene Artist, MD;  Location: [facility] ENDOSCOPY;  Service: Endoscopy;  Laterality: N/A;  . Colonoscopy N/A 02/24/2015    Procedure: COLONOSCOPY;  Surgeon: Gatha Mayer, MD;  Location: [facility];  Service: Endoscopy;  Laterality: N/A;   Family History  Problem Relation Age of Onset  . Heart disease Mother   . Diabetes Mother   . Diabetes Sister    Social History  Substance Use Topics  . Smoking status: Never Smoker   . Smokeless tobacco: Never Used  . Alcohol Use: No   OB History    Gravida Para Term Preterm AB TAB SAB Ectopic Multiple Living   _0 Review of Systems  Admits generalized weakness and fatigue Denies any headache, hematemesis, shortness of breath, edema, productive cough  Allergies  Cephalexin; Ciprofloxacin; Codeine; Contrast media; and Iohexol  Home Medications   Prior to Admission medications   Medication Sig Start Date End Date Taking? Authorizing Provider  ADCIRCA 20 MG TABS TAKE 2 TABLETS (40MG) BY MOUTH ONCE DAILY WITH OR WITHOUT FOOD. CALL [phone] FOR REFILLS. 06/13/14  Yes Collene Gobble, MD  ambrisentan (LETAIRIS) 10 MG tablet Take 1 tablet (10 mg total) by mouth daily. 04/11/15  Yes Larey Dresser, MD  amiodarone (PACERONE) 200 MG tablet Take 1 tablet (200 mg total) by mouth daily. 12/17/14  Yes Larey Dresser, MD  esomeprazole (NEXIUM) 40 MG capsule Take 40 mg by mouth 2 (two) times daily.    Yes Historical Provider, MD  fenofibrate 54 MG tablet Take 1 tablet (54 mg total) by mouth daily. 04/07/15  Yes Sid Falcon, MD  FLUoxetine (PROZAC) 20 MG capsule take 1 capsule by mouth every morning 02/14/15  Yes Sid Falcon, MD  HYDROcodone-acetaminophen [facility]) 10-325 MG per tablet Take 1 tablet by mouth every 6 (six) hours as needed for moderate pain.  07/17/12  Yes Historical Provider, MD  levothyroxine (SYNTHROID, LEVOTHROID) 25 MCG tablet Take 1 tablet (25 mcg total)  by mouth daily before breakfast. 12/28/14  Yes Eileen Stanford, PA-C  metolazone (ZAROXOLYN) 2.5 MG tablet Take 1 tablet (2.5 mg total) by mouth 2 (two) times a week. (Tuesdays and Fridays) 03/27/15  Yes Larey Dresser, MD  potassium chloride SA (K-DUR,KLOR-CON) 10 MEQ tablet Take 2 tablets (20 mEq total) by mouth daily. Take additional 20 meQ with metolazone doses 03/04/15  Yes Amy D Clegg, NP  pregabalin (LYRICA) 150 MG capsule Take 1 capsule (150 mg total) by mouth 3 (three) times daily. 03/19/15  Yes Dennie Bible, NP  promethazine (PHENERGAN) 12.5 MG tablet Take 12.5 mg by mouth every 6 (six) hours as needed for nausea or vomiting.   Yes Historical Provider, MD  Selexipag (UPTRAVI) 1600 MCG TABS Take 1,600 mcg by mouth 2 (two) times daily. 10/22/14  Yes Jolaine Artist, MD  torsemide (DEMADEX) 20 MG tablet Take 60 mg by mouth 2 (two) times daily.   Yes Historical Provider, MD  nortriptyline (PAMELOR) 10 MG capsule Take 2 capsules (20 mg total) by mouth at bedtime. 03/19/15   Dennie Bible, NP   BP 106/53 mmHg  Pulse 81  Temp(Src) 98.3 F (36.8 C) (Oral)  Resp 18  SpO2 93% Physical Exam  Constitutional: She is oriented to person, place, and time. She appears well-developed and well-nourished. No distress.  Chronically ill appearing, 44 yr AAF, uncomfortable appearing with abdominal pain and fatigued  HENT:  Head: Normocephalic and atraumatic.  Mouth/Throat: Oropharynx is clear and moist. No oropharyngeal exudate.  Eyes: Conjunctivae and EOM are normal. Pupils are equal, round, and reactive to light.  Pale conjunctiva  Neck: Normal range of motion. Neck supple. No thyromegaly present.  Cardiovascular: Normal rate, regular rhythm, normal heart sounds and intact distal pulses.   No murmur heard. Pulmonary/Chest: Effort normal and breath sounds normal. No respiratory distress. She has no wheezes. She has no rales. She exhibits no tenderness.  Abdominal: Soft. Bowel sounds are  normal. She exhibits no distension and no mass. There is tenderness (generalized unable to localize, seems increased mid to lower abdomen). There is no rebound and no guarding.  Musculoskeletal: Normal range of motion. She exhibits no edema or tenderness.  Neurological: She is alert and oriented to person, place, and time.  Skin: Skin is warm and dry. No rash noted. She is not diaphoretic.  Nursing note and vitals reviewed.   ED Course  Procedures (including critical care time) Labs Review Labs Reviewed  COMPREHENSIVE METABOLIC PANEL - Abnormal; Notable for the following:    Sodium 147 (*)    CO2 21 (*)    BUN 99 (*)    Creatinine, Ser 1.88 (*)    Albumin 3.1 (*)    ALT 8 (*)    GFR calc non Af Amer 27 (*)    GFR calc Af Amer 32 (*)    Anion gap 17 (*)    All other components within normal limits  CBC WITH DIFFERENTIAL/PLATELET - Abnormal; Notable for the following:    RBC 1.87 (*)    Hemoglobin 4.7 (*)    HCT 15.1 (*)    MCH 25.1 (*)    RDW 18.0 (*)    All other components within normal limits  PROTIME-INR - Abnormal; Notable for the following:    Prothrombin Time 18.6 (*)    INR 1.55 (*)    All other components within normal limits  POC OCCULT BLOOD, ED - Abnormal; Notable for the following:    Fecal Occult Bld POSITIVE (*)    All other components within normal limits  I-STAT CG4 LACTIC ACID, ED - Abnormal; Notable for the following:    Lactic Acid, Venous 5.51 (*)    All other components within normal limits  I-STAT CHEM 8, ED - Abnormal; Notable for the following:    Sodium 149 (*)    BUN 101 (*)    Creatinine, Ser 1.80 (*)    Hemoglobin 6.1 (*)    HCT 18.0 (*)    All other components within normal limits  APTT  TROPONIN I  TSH  TROPONIN I  CBC  TYPE AND SCREEN  PREPARE RBC (CROSSMATCH)  PREPARE RBC (CROSSMATCH)  TYPE AND SCREEN  PREPARE RBC (CROSSMATCH)    Imaging Review No results found. I have personally reviewed and evaluated these images and lab  results as part of my medical decision-making.   EKG Interpretation   Date/Time:  Monday May 12 2015 13:16:56 EST Ventricular Rate:  67  PR Interval:  219 QRS Duration: 102 QT Interval:  442 QTC Calculation: 529 R Axis:   95 Text Interpretation:  Sinus rhythm Borderline prolonged PR interval Right  axis deviation Low voltage, precordial leads Nonspecific T abnormalities,  lateral leads Prolonged QT interval No significant change since last  tracing Confirmed by KNOTT MD, Quillian Quince ([id number]) on 05/12/2015 2:36:17 PM      MDM   Final diagnoses:  Angiodysplasia of colon with hemorrhage  Blood loss anemia  Lactic acidosis   89 yr AAF PMH known angiodysplasia colon with h/o LGIB (02/2015 with colonoscopy), pulm HTN, DM2, systemic sclerosis, HFpEF, presents to ED following acute on chronic melanotic GI bleeding, recurrent episode, associated with dizziness, chest/abdominal pain, no clear LOC or other injury. Prior to arrival received 500cc IVF bolus, in ED hemodynamically with chronic hypotension ranging 80-100 SBP / 40-50 DBP, lowest to 89/45, otherwise no tachycardia, afebrile, in trendelenburg position with head lower, appears to be mentating appropriately despite distress with GIB and abdominal discomfort. Initial work-up with i-stat labs, chem-8, lactic acid.  Suspect acute on chronic GI bleeding from likely known multiple angiodysplastic lesions in colon, with melanotic dark stools, consider UGIB without known prior history. Acute on chronic blood loss anemia (i-stat Hgb 6.1, then CBC to 4.7, HCT 15, BUN 101), additionally Lactic acidosis with i-stat lactic 5.51, consider differential with acute mesenteric ischemia. Initially had ordered 2u PRBC with transfuse 1 unit, following CBC results, changed orders to release 2u emergently from blood bank, and start transfusing x 2 units immediately in ED. Consulted [facility] for acute on chronic GIB, patient to be admitted to SDU, paged sent to  [facility].  Discussed case with [facility], agreed to admission inpatient, SDU, currently receiving 2u PRBC emergently in ED, GI to consult in ED for further recommendations, temp admit orders placed.  Olin Hauser, DO 05/12/15 1551  Leo Grosser, MD 05/12/15 [phone]

## 2015-05-12 NOTE — Progress Notes (Signed)
Hypoglycemic Event  CBG: 45   Treatment: D50 IV 25 mL  Symptoms: Pale and Hungry  Follow-up CBG: Time: 2048 CBG Result:152  Possible Reasons for Event: Inadequate meal intake and Change in activity  Comments/MD notified:Schorr NP     [name], [name]

## 2015-05-12 NOTE — Consult Note (Signed)
[facility] Consult: 3:14 PM 05/12/2015     Referring Provider: Dr Marily Memos  Primary Care Physician:  Gilles Chiquito, MD Primary Gastroenterologist:  Dr. Deatra Ina, has been reassigned to a new GI MD.     Reason for Consultation:  Recurrent GI bleeding with dark, bloody stool.  ABL anemia.     HPI: [name] is a 65 y.o. female. Hx scleroderma/systemic sclerosis, severe pulmonary htn/cor pulmonale. Diastolic heart failure. Pericardial effusion with tamponade, pericardiocentesis 04/2014. Type 2 DM. PAF with RVR but not an AC candidate. SS trait. Anemia. Diabetic neuropathy and nephropathy. CKD.Hypothyroidism. Anemia requiring transfusions ( 4 PRBC 03/2014, 2 PRBC 12/23/14  Hgb 5.8, 02/2015, 4 PRBC 02/2015 Hgb 7.0) low iron and iron sat. Received Feraheme in 12/2014, 01/29/15 and 05/09/15.  PO iron previously discontinued by hematologist Dr Lindi Adie who suspects iron malabsorption.   02/24/15   Colonoscopy for hematochezia, anemia due to blood loss. ENDOSCOPIC IMPRESSION: 1. Numerous non-bleeding AVM's - mostly in Left colon and rectum - largest 5-6 mm. A few in right colon ?? - faint. I ablated 10-15 in left colon to see if that helps her 2. Internal hemorrhoids RECOMMENDATIONS: Observe - it will be hard to achieve ablation of all of these AVM's so would keep threshold to rescope high in her I suspect. support with iron infusions and blood as needed and avoid anti-coagulants GI f/u will occur when/if needed w/ Dr. Havery Moros, Silverio Decamp or Danis (she is a former [employer] patient) She tolerated moderate sedation with chronically low BP's 02/21/15 EGD:  Normal.  03/2014 EGD for melena. Dr Fuller Plan. 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  09/2013 Colonoscopy for fecal incontinence Faint erythema throughout  the colon. AVMs seen throughout the colon but more prominent on the left. Internal hemorrhoids.  Pathology: microscopic colitis 11/2011 colonoscopy ??, unable to locate reports.  02/2010 EGD to third portion of duodenum For dysphagia and history scleroderma. Megaesophagus throughout entire esophagus. No stricture noted but Maloney dilatation performed. 08/2008 EGD by Dr. Jim Desanctis. For complaint of abdominal pain. Decreased esophageal peristalsis noted but grossly normal mucosa of the esophagus, stomach and small bowel. 08/2008 colonoscopy For abdominal pain.  Internal hemorrhoids. Otherwise normal study. 1994 EGD. By Dr. Deatra Ina. gastritis/esophagitis.  Returns to ED today with black stools.  Hgb is 4.7, was 9.6 on 2/17.   She was doing well until recurrent black, loose stool beginning last night, same this AM.  + abdominal pain and non-bloody n/v.  Syncopal at home.     Past Medical History  Diagnosis Date  . Secondary pulmonary hypertension ([place])     a. 2/2 scleroderma  . GERD (gastroesophageal reflux disease)   . Systemic sclerosis ([place])   . Unspecified essential hypertension   . Gastroparesis   . Sickle cell trait ([place])   . Obesity   . Scleroderma ([place])   . Trichomonas   . Peripheral neuropathy ([place])   . Type II diabetes mellitus ([facility])     a. diet control   . Arthritis   . Chronic kidney disease   .  Gout   . Hypothyroidism     a. may be due to amiodarone use. started on synthroid on 12/2014 admission   . Anemia     a.  hx of GI bleed and duodenal AVMs  . Chronic diastolic CHF (congestive heart failure) ([place])     a. Echo 12/23/14 withEF 60-65%, moderately dilated RV, PASP 72, no pericardial effusion.  Marland Kitchen PAF (paroxysmal atrial fibrillation) (H[facility])     a. not a long term AC candidate due to GI bleeds  . Angiodysplasia of colon with hemorrhage     Past Surgical History  Procedure Laterality Date  . Tubal ligation    . Esophagogastroduodenoscopy  02/23/2010    D 2 AVM,  ablated with APC.   Marland Kitchen Shoulder arthroscopy Right 12/2009    subacromial decompression  . Replacement total knee Left 11/2000  . Cardiac catheterization Right 04/24/2004  . Tonsillectomy  1960's  . Abdominal hysterectomy  1983    "partial"  . Shoulder arthroscopy w/ rotator cuff repair Right 01/2010  . Excisional total knee arthroplasty with antibiotic spacers Left 08/2010    "got infected & had to take 1st replacement out"  . Revision total knee arthroplasty Left 11/2010    "removed spacers; replaced knee"  . Total knee revision with scar debridement/patella revision with poly exchange Left 12/2010    fell; knee split opened; had to redo revision"  . Cardiac catheterization Left 07/2010  . Peripherally inserted central catheter insertion  09/2010  . Colonoscopy  09/06/2008, 09/2013    int rrhoids:Dr. Lajoyce Corners 2010. Pan-colonic AVMs, microscopic colitis per Dr Deatra Ina 2015  . Right heart catheterization N/A 03/18/2014    Procedure: RIGHT HEART CATH;  Surgeon: Larey Dresser, MD;  Location: [facility] CATH LAB;  Service: Cardiovascular;  Laterality: N/A;  . Esophagogastroduodenoscopy N/A 03/23/2014    Procedure: ESOPHAGOGASTRODUODENOSCOPY (EGD);  Surgeon: Ladene Artist, MD;  Location: [facility] ENDOSCOPY;  Service: Endoscopy;  Laterality: N/A;  . Pericardial tap N/A 05/03/2014    Procedure: PERICARDIAL TAP;  Surgeon: Sinclair Grooms, MD;  Location: [facility] CATH LAB;  Service: Cardiovascular;  Laterality: N/A;  . Esophagogastroduodenoscopy N/A 02/21/2015    Procedure: ESOPHAGOGASTRODUODENOSCOPY (EGD);  Surgeon: Ladene Artist, MD;  Location: [facility] ENDOSCOPY;  Service: Endoscopy;  Laterality: N/A;  . Colonoscopy N/A 02/24/2015    Procedure: COLONOSCOPY;  Surgeon: Gatha Mayer, MD;  Location: [place];  Service: Endoscopy;  Laterality: N/A;    Prior to Admission medications   Medication Sig Start Date End Date Taking? Authorizing Provider  ADCIRCA 20 MG TABS TAKE 2 TABLETS (40MG) BY MOUTH ONCE DAILY WITH OR WITHOUT  FOOD. CALL [phone] FOR REFILLS. 06/13/14  Yes Collene Gobble, MD  ambrisentan (LETAIRIS) 10 MG tablet Take 1 tablet (10 mg total) by mouth daily. 04/11/15  Yes Larey Dresser, MD  amiodarone (PACERONE) 200 MG tablet Take 1 tablet (200 mg total) by mouth daily. 12/17/14  Yes Larey Dresser, MD  esomeprazole (NEXIUM) 40 MG capsule Take 40 mg by mouth 2 (two) times daily.    Yes Historical Provider, MD  fenofibrate 54 MG tablet Take 1 tablet (54 mg total) by mouth daily. 04/07/15  Yes Sid Falcon, MD  FLUoxetine (PROZAC) 20 MG capsule take 1 capsule by mouth every morning 02/14/15  Yes Sid Falcon, MD  HYDROcodone-acetaminophen [facility]) 10-325 MG per tablet Take 1 tablet by mouth every 6 (six) hours as needed for moderate pain.  07/17/12  Yes Historical Provider, MD  levothyroxine (  SYNTHROID, LEVOTHROID) 25 MCG tablet Take 1 tablet (25 mcg total) by mouth daily before breakfast. 12/28/14  Yes Eileen Stanford, PA-C  metolazone (ZAROXOLYN) 2.5 MG tablet Take 1 tablet (2.5 mg total) by mouth 2 (two) times a week. (Tuesdays and Fridays) 03/27/15  Yes Larey Dresser, MD  potassium chloride SA (K-DUR,KLOR-CON) 10 MEQ tablet Take 2 tablets (20 mEq total) by mouth daily. Take additional 20 meQ with metolazone doses 03/04/15  Yes Amy D Clegg, NP  pregabalin (LYRICA) 150 MG capsule Take 1 capsule (150 mg total) by mouth 3 (three) times daily. 03/19/15  Yes Dennie Bible, NP  promethazine (PHENERGAN) 12.5 MG tablet Take 12.5 mg by mouth every 6 (six) hours as needed for nausea or vomiting.   Yes Historical Provider, MD  Selexipag (UPTRAVI) 1600 MCG TABS Take 1,600 mcg by mouth 2 (two) times daily. 10/22/14  Yes Jolaine Artist, MD  torsemide (DEMADEX) 20 MG tablet Take 60 mg by mouth 2 (two) times daily.   Yes Historical Provider, MD  nortriptyline (PAMELOR) 10 MG capsule Take 2 capsules (20 mg total) by mouth at bedtime. 03/19/15   Dennie Bible, NP    Scheduled Meds:   Infusions: . sodium  chloride    . sodium chloride     PRN Meds:    Allergies as of 05/12/2015 - Review Complete 05/12/2015  Allergen Reaction Noted  . Cephalexin Rash 05/01/2010  . Ciprofloxacin Rash 07/23/2010  . Codeine Other (See Comments)   . Contrast media [iodinated diagnostic agents] Hives 08/03/2011  . Iohexol Hives 03/04/2008    Family History  Problem Relation Age of Onset  . Heart disease Mother   . Diabetes Mother   . Diabetes Sister     Social History   Social History  . Marital Status: Legally Separated    Spouse Name: N/A  . Number of Children: 2  . Years of Education: 11   Occupational History  . Umemployed   . Disability     Social History Main Topics  . Smoking status: Never Smoker   . Smokeless tobacco: Never Used  . Alcohol Use: No  . Drug Use: No  . Sexual Activity: No   Other Topics Concern  . Not on file   Social History Narrative    FAMILY HISTORY:  Significant for coronary artery disease and diabetes   Patient lives at home with granddaughter.    Patient has 2 children.    Patient has 11 years of education.    Patient is on disability.    Patient is right handed.    Patient is separated.      REVIEW OF SYSTEMS: Constitutional:  Generally frail and weak ENT:  No nose bleeds Pulm:  No acute SOB or productive cough CV:  No palpitations, no LE edema.  GU:  No hematuria, no frequency GI:  Per HPI Heme:  Per HPI   Transfusions:  Per HPI Neuro: + syncope Derm:  No itching, no rash or sores.  Endocrine:  No sweats or chills.  No polyuria or dysuria Immunization:  Not queried.  Travel:  None   PHYSICAL EXAM: Vital signs in last 24 hours: Filed Vitals:   05/12/15 1450 05/12/15 1511  BP: 101/43 89/45  Pulse:    Temp:  98.7 F (37.1 C)  Resp: 16 14   Wt Readings from Last 3 Encounters:  05/02/15 67.405 kg (148 lb 9.6 oz)  04/23/15 66.724 kg (147 lb 1.6 oz)  04/18/15 63.957  kg (141 lb)    General: frail, cachectic AAF.  Comfortable but  ill looking Head:  No asymmetry or swelling  Eyes:  No icterus.  Conjunctiva pale Ears:  Slightly HOH  Nose:  No discharge Mouth:  Clear, no blood.  MM slightly dry. Neck:  No mass, no JVD.   Lungs:  Clear bil.   Heart: RRR.  No mrb Abdomen:  Thin, tender in RLQ but no guard or rebound.  BS present.   Rectal: deferred.  Per RN: initially black stool, then deep red blood.    Musc/Skeltl: kyphosis Extremities:  No CCE  Neurologic:  Oriented x 3.  Moves all 4s,  No tremor Skin:  Dry, + allopecia. Tattoos:  none   Intake/Output from previous day:   Intake/Output this shift:    LAB RESULTS:  Recent Labs  05/12/15 1439 05/12/15 1446  WBC  --  9.0  HGB 6.1* 4.7*  HCT 18.0* 15.1*  PLT  --  202   BMET Lab Results  Component Value Date   NA 149* 05/12/2015   NA 142 04/11/2015   NA 143 03/27/2015   K 3.5 05/12/2015   K 3.9 04/11/2015   K 4.0 03/27/2015   CL 107 05/12/2015   CL 100* 04/11/2015   CL 108 03/27/2015   CO2 24 04/11/2015   CO2 25 03/27/2015   CO2 24 03/04/2015   GLUCOSE 74 05/12/2015   GLUCOSE 76 04/11/2015   GLUCOSE 72 03/27/2015   BUN 101* 05/12/2015   BUN 96* 04/11/2015   BUN 47* 03/27/2015   CREATININE 1.80* 05/12/2015   CREATININE 1.95* 04/11/2015   CREATININE 1.67* 03/27/2015   CALCIUM 9.8 04/11/2015   CALCIUM 8.8* 03/27/2015   CALCIUM 8.4* 03/04/2015   LFT No results for input(s): PROT, ALBUMIN, AST, ALT, ALKPHOS, BILITOT, BILIDIR, IBILI in the last 72 hours. PT/INR Lab Results  Component Value Date   INR 1.55* 05/12/2015   INR 1.32 02/22/2015   INR 1.19 03/17/2014   PROTIME 18.7 08/20/2008   Hepatitis Panel No results for input(s): HEPBSAG, HCVAB, HEPAIGM, HEPBIGM in the last 72 hours. C-Diff No components found for: CDIFF Lipase     Component Value Date/Time   LIPASE 29 02/20/2015 2111    Drugs of Abuse     Component Value Date/Time   LABOPIA POSITIVE* 10/02/2010 1128   COCAINSCRNUR NONE DETECTED 10/02/2010 1128   LABBENZ  NONE DETECTED 10/02/2010 1128   AMPHETMU NONE DETECTED 10/02/2010 1128   THCU NONE DETECTED 10/02/2010 1128   LABBARB NONE DETECTED 10/02/2010 1128     RADIOLOGY STUDIES: No results found.  ENDOSCOPIC STUDIES: Per HPI  IMPRESSION:   *  Chronic recurrent GI bleeding with blood loss anemia.  Multiple non-bleeding  AVMs were ablated on colonoscopy 02/2015, suspect these are culprit and likely has SB AVMs as well.  Anemia symptomatic with syncope this AM.    *  Scleroderma/systemic sclerosis, cor pulmonale/pulmonary htn.    PLAN:     *  Per Dr Henrene Pastor  *  Transfuse as doing, 2 units ordered.  Serial Hgb.   *  Does not need IV Protonix, EGD was normal in setting of duplicate presentation in 02/2015. Start oral PPI.  Daily is fine.  *  Clears.    Azucena Freed  05/12/2015, 3:14 PM Pager: [phone]   GI ATTENDING  History, laboratories, multiple prior endoscopy reports reviewed. Patient seen and examined. Agree with comprehensive consultation note as outlined above. Extremely complicated patient with multiple medical problems  as outlined. Has a history of chronic anemia. Has a history of GI bleeding secondary to AVMs in multiple locations. Has had multiple endoscopic procedures. Suspect current bleeding is upper gut or small bowel with elevated BUN and description of stools. Had colonoscopy just 2 months ago with ablation. Right now recommend supportive care with transfusion. Will likely recommend upper endoscopy/enteroscopy with MAC assistance in this high-risk patient.  Docia Chuck. Geri [place]in[place]., M.D. [facility] Division of Gastroenterology

## 2015-05-12 NOTE — H&P (Signed)
[facility] History and Physical  [name] QIO:[id number] DOB: 1950-11-01 DOA: 05/12/2015  Referring physician: Parks Ranger PCP: Gilles Chiquito, MD   Chief Complaint: melena/BRBPR  HPI: [name] is a 65 y.o. female with a history of scleroderma/systemic sclerosis, severe pulmonary hypertension/cor pulmonale, diastolic heart failure, diabetes, anemia, right kidney disease, hypothyroidism presents to the emergency department the chief complaint of recurrent dark and bloody stools. Initial evaluation in the emergency department reveals acute blood loss anemia, elevated lactic acid, hypotension and 2 episodes of large amount of melena.  Information is obtained from the patient and the sisters who are at the bedside. Reportedly symptoms started yesterday with "dark" stools. He slept the night but awakened this morning had persistent dark stools makes with bright red blood per rectum. Associated symptoms include lightheadedness, dizziness and near syncope as well as nausea without vomiting. In addition she complained of diffuse abdominal pain characterized a sharp located throughout nothing makes it better or worse. EMS was called she was given 500 mL of normal saline and Zofran for nausea  In the emergency department she is afebrile with a systolic blood pressure range of 80-106  she's not tachycardic she's not hypoxic. She received 2 L normal saline and 2 units of packed red blood cells swell as Protonix  Review of Systems:  10 point review of systems complete and all systems are negative except as indicated in the history of present illness.   Past Medical History  Diagnosis Date  . Secondary pulmonary hypertension ([place])     a. 2/2 scleroderma  . GERD (gastroesophageal reflux disease)   . Systemic sclerosis ([place])   . Unspecified essential hypertension   . Gastroparesis   . Sickle cell trait ([place])   . Obesity   . Scleroderma ([place])   . Trichomonas   . Peripheral neuropathy  ([place])   . Type II diabetes mellitus (HCC)     a. diet control   . Arthritis   . Chronic kidney disease   . Gout   . Hypothyroidism     a. may be due to amiodarone use. started on synthroid on 12/2014 admission   . Anemia     a.  hx of GI bleed and duodenal AVMs  . Chronic diastolic CHF (congestive heart failure) ([place])     a. Echo 12/23/14 withEF 60-65%, moderately dilated RV, PASP 72, no pericardial effusion.  Marland Kitchen PAF (paroxysmal atrial fibrillation) (HCC)     a. not a long term AC candidate due to GI bleeds  . Angiodysplasia of colon with hemorrhage    Past Surgical History  Procedure Laterality Date  . Tubal ligation    . Esophagogastroduodenoscopy  02/23/2010    D 2 AVM, ablated with APC.   Marland Kitchen Shoulder arthroscopy Right 12/2009    subacromial decompression  . Replacement total knee Left 11/2000  . Cardiac catheterization Right 04/24/2004  . Tonsillectomy  1960's  . Abdominal hysterectomy  1983    "partial"  . Shoulder arthroscopy w/ rotator cuff repair Right 01/2010  . Excisional total knee arthroplasty with antibiotic spacers Left 08/2010    "got infected & had to take 1st replacement out"  . Revision total knee arthroplasty Left 11/2010    "removed spacers; replaced knee"  . Total knee revision with scar debridement/patella revision with poly exchange Left 12/2010    fell; knee split opened; had to redo revision"  . Cardiac catheterization Left 07/2010  . Peripherally inserted central catheter insertion  09/2010  . Colonoscopy  09/06/2008, 09/2013    int rrhoids:Dr. Orr 2010. Pan-colonic AVMs, microscopic colitis per Dr Deatra Ina 2015  . Right heart catheterization N/A 03/18/2014    Procedure: RIGHT HEART CATH;  Surgeon: Larey Dresser, MD;  Location[facility] CATH LAB;  Service: Cardiovascular;  Laterality: N/A;  . Esophagogastroduodenoscopy N/A 03/23/2014    Procedure: ESOPHAGOGASTRODUODENOSCOPY (EGD);  Surgeon: Ladene Artist, MD;  Location: [facility] ENDOSCOPY;  Service: Endoscopy;   Laterality: N/A;  . Pericardial tap N/A 05/03/2014    Procedure: PERICARDIAL TAP;  Surgeon: Sinclair Grooms, MD;  Location[facility] LAB;  Service: Cardiovascular;  Laterality: N/A;  . Esophagogastroduodenoscopy N/A 02/21/2015    Procedure: ESOPHAGOGASTRODUODENOSCOPY (EGD);  Surgeon: Ladene Artist, MD;  Location: [facility] ENDOSCOPY;  Service: Endoscopy;  Laterality: N/A;  . Colonoscopy N/A 02/24/2015    Procedure: COLONOSCOPY;  Surgeon: Gatha Mayer, MD;  Location: [place];  Service: Endoscopy;  Laterality: N/A;   Social History:  reports that she has never smoked. She has never used smokeless tobacco. She reports that she does not drink alcohol or use illicit drugs.  Allergies  Allergen Reactions  . Cephalexin Rash  . Ciprofloxacin Rash  . Codeine Other (See Comments)     GI upset  . Contrast Media [Iodinated Diagnostic Agents] Hives  . Iohexol Hives     Code: HIVES, Desc: pt breaks out in large hives. needs full premeds, Onset Date: [id number]     Family History  Problem Relation Age of Onset  . Heart disease Mother   . Diabetes Mother   . Diabetes Sister      Prior to Admission medications   Medication Sig Start Date End Date Taking? Authorizing Provider  ADCIRCA 20 MG TABS TAKE 2 TABLETS (40MG) BY MOUTH ONCE DAILY WITH OR WITHOUT FOOD. CALL [phone] FOR REFILLS. 06/13/14  Yes Collene Gobble, MD  ambrisentan (LETAIRIS) 10 MG tablet Take 1 tablet (10 mg total) by mouth daily. 04/11/15  Yes Larey Dresser, MD  amiodarone (PACERONE) 200 MG tablet Take 1 tablet (200 mg total) by mouth daily. 12/17/14  Yes Larey Dresser, MD  esomeprazole (NEXIUM) 40 MG capsule Take 40 mg by mouth 2 (two) times daily.    Yes Historical Provider, MD  fenofibrate 54 MG tablet Take 1 tablet (54 mg total) by mouth daily. 04/07/15  Yes Sid Falcon, MD  FLUoxetine (PROZAC) 20 MG capsule take 1 capsule by mouth every morning 02/14/15  Yes Sid Falcon, MD  HYDROcodone-acetaminophen [facility]) 10-325  MG per tablet Take 1 tablet by mouth every 6 (six) hours as needed for moderate pain.  07/17/12  Yes Historical Provider, MD  levothyroxine (SYNTHROID, LEVOTHROID) 25 MCG tablet Take 1 tablet (25 mcg total) by mouth daily before breakfast. 12/28/14  Yes Eileen Stanford, PA-C  metolazone (ZAROXOLYN) 2.5 MG tablet Take 1 tablet (2.5 mg total) by mouth 2 (two) times a week. (Tuesdays and Fridays) 03/27/15  Yes Larey Dresser, MD  potassium chloride SA (K-DUR,KLOR-CON) 10 MEQ tablet Take 2 tablets (20 mEq total) by mouth daily. Take additional 20 meQ with metolazone doses 03/04/15  Yes Amy D Clegg, NP  pregabalin (LYRICA) 150 MG capsule Take 1 capsule (150 mg total) by mouth 3 (three) times daily. 03/19/15  Yes Dennie Bible, NP  promethazine (PHENERGAN) 12.5 MG tablet Take 12.5 mg by mouth every 6 (six) hours as needed for nausea or vomiting.   Yes Historical Provider, MD  Selexipag (UPTRAVI) 1600 MCG TABS Take 1,600  mcg by mouth 2 (two) times daily. 10/22/14  Yes Jolaine Artist, MD  torsemide (DEMADEX) 20 MG tablet Take 60 mg by mouth 2 (two) times daily.   Yes Historical Provider, MD  nortriptyline (PAMELOR) 10 MG capsule Take 2 capsules (20 mg total) by mouth at bedtime. 03/19/15   Dennie Bible, NP   Physical Exam: Filed Vitals:   05/12/15 1545 05/12/15 1555 05/12/15 1610 05/12/15 1615  BP: 106/53 104/49 101/51 101/53  Pulse: 81 78 88   Temp:  98.6 F (37 C) 98.5 F (36.9 C) 98.7 F (37.1 C)  TempSrc:  Oral Oral Oral  Resp: _0 SpO2: 93% 99% 100% 99%    Wt Readings from Last 3 Encounters:  05/02/15 67.405 kg (148 lb 9.6 oz)  04/23/15 66.724 kg (147 lb 1.6 oz)  04/18/15 63.957 kg (141 lb)    General:  Appears calm only slightly uncomfortable Eyes: PERRL, normal lids, irises & conjunctiva ENT: grossly normal hearing, lips & tongue dismembered of her mouth slightly pale slightly dry Neck: no LAD, masses or thyromegaly Cardiovascular: RRR, no m/r/g. No LE  edema. Telemetry: SR, no arrhythmias  Respiratory: CTA bilaterally, no w/r/r. Normal respiratory effort. Abdomen: soft, nondistended sluggish bowel sounds diffuse moderate tenderness to minimal palpation. No guarding or rebounding Skin: no rash or induration seen on limited exam Musculoskeletal: grossly normal tone BUE/BLE Psychiatric: grossly normal mood and affect, speech fluent and appropriate Neurologic: grossly non-focal.          Labs on Admission:  Basic Metabolic Panel:  Recent Labs Lab 05/12/15 1439 05/12/15 1446  NA 149* 147*  K 3.5 3.6  CL 107 109  CO2  --  21*  GLUCOSE 74 82  BUN 101* 99*  CREATININE 1.80* 1.88*  CALCIUM  --  9.0   Liver Function Tests:  Recent Labs Lab 05/12/15 1446  AST 18  ALT 8*  ALKPHOS 71  BILITOT 0.3  PROT 6.6  ALBUMIN 3.1*   No results for input(s): LIPASE, AMYLASE in the last 168 hours. No results for input(s): AMMONIA in the last 168 hours. CBC:  Recent Labs Lab 05/12/15 1439 05/12/15 1446  WBC  --  9.0  NEUTROABS  --  6.9  HGB 6.1* 4.7*  HCT 18.0* 15.1*  MCV  --  80.7  PLT  --  202   Cardiac Enzymes:  Recent Labs Lab 05/12/15 1446  TROPONINI <0.03    BNP (last 3 results)  Recent Labs  01/21/15 1206 01/30/15 1200 03/27/15 1617  BNP 449.8* 491.3* 447.6*    ProBNP (last 3 results) No results for input(s): PROBNP in the last 8760 hours.  CBG: No results for input(s): GLUCAP in the last 168 hours.  Radiological Exams on Admission: No results found.  EKG: Independently reviewed.Borderline prolonged PR interval Right axis deviation Low voltage, precordial leads Nonspecific T abnormalities, lateral leads Prolonged QT interval No significant change since last tracing  Assessment/Plan Principal Problem:   Acute blood loss anemia Active Problems:   DM type 2 with diabetic peripheral neuropathy (HCC)   Hypertriglyceridemia   Essential hypertension   GERD   SCLERODERMA   Hypotension   CKD (chronic  kidney disease), stage III   AVM (arteriovenous malformation) of duodenum, acquired with hemorrhage   Hypothyroidism   PAF (paroxysmal atrial fibrillation) (HCC)   Chronic diastolic CHF (congestive heart failure) (HCC)   Melena   GI bleed   Elevated lactic acid level  #1. Acute blood loss anemia  in the setting of GI bleed recent colonoscopy reveals multiple angiodysplasia lesions. Hemoglobin 4.7 on admission down from 9.6 10 days ago. She is not tachycardic and blood pressure is at baseline which is somewhat soft. Continues with melenic stools in the emergency department. Status post outpatient iron infusions -Admit to step down -Transfuse 3 units of red blood cells emergently -Continue IV fluids -Proton X oral per GI -Serial CBCs  #2. GI bleed/melena/bright red blood per rectum. See #1. Elevated lactic acid. BUN 99 up from 47 1 month ago. Hospitalized December 2016 with similar presentation. Colonoscopy at that time showed multiple angiodysplasia lesions somewhat dilated. Endoscopy done at same time revealed numerous nonbleeding AVMs. Evaluated by GI who recommended observation with high threshold for rescoping per GI note -Proton X -Clear liquids -Serial H&H -GI consult  #3. Hypertension. Her pressure soft on admission. Chart review indicates systolic blood pressure ranges 85-100. Blood pressure close to baseline. She is not tachycardic. He received 2 L of normal saline while in the emergency department and a 500 mL bolus per EMS. Of note she has a history CHF with an EF of 60%. Home medications include amiodarone, Zaroxolyn, Demadex. -Monitor closely -Will resume amiodarone -We will resume Demadex tomorrow  #4. Chronic diastolic heart failure. Echo done late 2016 with an EF of 60%. I medications include amiodarone, Zaroxolyn, Demadex. Appears compensated. -We'll continue amiodarone and resume Demadex tomorrow. Hold zaroxolyn and adcirca -Intake and output -Daily weights -monitor  closely  #5. Hypothyroidism. - Will obtain a TSH -Continue home meds  #6. Chronic kidney disease stage III. Creatinine 1.88 which is close to baseline -Monitor urine output -Old nephrotoxins -Recheck in the morning  #7. Elevated lactic acid likely secondary to tissue ischemia secondary to low hemoglobin in the setting of GI bleed. She received 2 L of normal saline in the emergency department in 500 mL per EMS. Not tachycardic but blood pressure somewhat soft which is her baseline -Monitor closely -Cycle lactic acid -Continue gentle IV fluids  #7. PAF. Vasc score 4.  EKG with sinus rhythm and nonspecific T-wave abnormality. Heart rate 80. -Not anticoagulation candidate -continue home meds.   #8.Scleroderma. Appears stable at baseline -monitor  Dr Henrene Pastor  Code Status: full DVT Prophylaxis: Family Communication: sisters at bedside Disposition Plan: home when ready  Time spent: 3 minutes  [facility]

## 2015-05-12 NOTE — Progress Notes (Signed)
CRITICAL VALUE ALERT  Critical value received:  Lactic Acid 4.6   Date of notification:  05/12/2015  Time of notification:  2100  Critical value read back:Yes.    Nurse who received alert:  Nani Ravens RN   MD notified (1st page):  Schorr NP   Time of first page:  2105  MD notified (2nd page):  Time of second page:  Responding MD:    Time MD responded:

## 2015-05-12 NOTE — ED Notes (Addendum)
Dr aware of   Low hgb 4,7 knott

## 2015-05-12 NOTE — Telephone Encounter (Signed)
Spoke w/[facility], we are aware of BP and DR Aundra Dubin

## 2015-05-13 ENCOUNTER — Encounter (HOSPITAL_COMMUNITY): Payer: Self-pay | Admitting: *Deleted

## 2015-05-13 DIAGNOSIS — E039 Hypothyroidism, unspecified: Secondary | ICD-10-CM

## 2015-05-13 DIAGNOSIS — I272 Other secondary pulmonary hypertension: Secondary | ICD-10-CM

## 2015-05-13 DIAGNOSIS — M3481 Systemic sclerosis with lung involvement: Secondary | ICD-10-CM

## 2015-05-13 DIAGNOSIS — E781 Pure hyperglyceridemia: Secondary | ICD-10-CM

## 2015-05-13 DIAGNOSIS — I130 Hypertensive heart and chronic kidney disease with heart failure and stage 1 through stage 4 chronic kidney disease, or unspecified chronic kidney disease: Secondary | ICD-10-CM

## 2015-05-13 DIAGNOSIS — K5521 Angiodysplasia of colon with hemorrhage: Principal | ICD-10-CM

## 2015-05-13 DIAGNOSIS — E1122 Type 2 diabetes mellitus with diabetic chronic kidney disease: Secondary | ICD-10-CM

## 2015-05-13 DIAGNOSIS — I509 Heart failure, unspecified: Secondary | ICD-10-CM

## 2015-05-13 DIAGNOSIS — D62 Acute posthemorrhagic anemia: Secondary | ICD-10-CM

## 2015-05-13 DIAGNOSIS — K219 Gastro-esophageal reflux disease without esophagitis: Secondary | ICD-10-CM

## 2015-05-13 DIAGNOSIS — Q2733 Arteriovenous malformation of digestive system vessel: Secondary | ICD-10-CM

## 2015-05-13 DIAGNOSIS — I480 Paroxysmal atrial fibrillation: Secondary | ICD-10-CM

## 2015-05-13 LAB — GLUCOSE, CAPILLARY
GLUCOSE-CAPILLARY: 101 mg/dL — AB (ref 65–99)
GLUCOSE-CAPILLARY: 107 mg/dL — AB (ref 65–99)
GLUCOSE-CAPILLARY: 114 mg/dL — AB (ref 65–99)
GLUCOSE-CAPILLARY: 127 mg/dL — AB (ref 65–99)
GLUCOSE-CAPILLARY: 41 mg/dL — AB (ref 65–99)
GLUCOSE-CAPILLARY: 42 mg/dL — AB (ref 65–99)
Glucose-Capillary: 110 mg/dL — ABNORMAL HIGH (ref 65–99)
Glucose-Capillary: 59 mg/dL — ABNORMAL LOW (ref 65–99)
Glucose-Capillary: 95 mg/dL (ref 65–99)

## 2015-05-13 LAB — BASIC METABOLIC PANEL
ANION GAP: 9 (ref 5–15)
BUN: 77 mg/dL — ABNORMAL HIGH (ref 6–20)
CO2: 23 mmol/L (ref 22–32)
Calcium: 8.9 mg/dL (ref 8.9–10.3)
Chloride: 115 mmol/L — ABNORMAL HIGH (ref 101–111)
Creatinine, Ser: 1.49 mg/dL — ABNORMAL HIGH (ref 0.44–1.00)
GFR calc Af Amer: 42 mL/min — ABNORMAL LOW (ref >60)
GFR calc non Af Amer: 36 mL/min — ABNORMAL LOW (ref >60)
GLUCOSE: 121 mg/dL — AB (ref 65–99)
POTASSIUM: 3.3 mmol/L — AB (ref 3.5–5.1)
Sodium: 147 mmol/L — ABNORMAL HIGH (ref 135–145)

## 2015-05-13 LAB — CBC
HCT: 20.1 % — ABNORMAL LOW (ref 36.0–46.0)
HEMATOCRIT: 21.1 % — AB (ref 36.0–46.0)
HEMOGLOBIN: 7.1 g/dL — AB (ref 12.0–15.0)
HEMOGLOBIN: 6.6 g/dL — AB (ref 12.0–15.0)
MCH: 27.6 pg (ref 26.0–34.0)
MCH: 27 pg (ref 26.0–34.0)
MCHC: 33.6 g/dL (ref 30.0–36.0)
MCHC: 32.8 g/dL (ref 30.0–36.0)
MCV: 82.1 fL (ref 78.0–100.0)
MCV: 82.4 fL (ref 78.0–100.0)
PLATELETS: 173 10*3/uL (ref 150–400)
PLATELETS: 195 10*3/uL (ref 150–400)
RBC: 2.57 MIL/uL — ABNORMAL LOW (ref 3.87–5.11)
RBC: 2.44 MIL/uL — AB (ref 3.87–5.11)
RDW: 16.2 % — ABNORMAL HIGH (ref 11.5–15.5)
RDW: 16.8 % — ABNORMAL HIGH (ref 11.5–15.5)
WBC: 7.9 10*3/uL (ref 4.0–10.5)
WBC: 8.4 10*3/uL (ref 4.0–10.5)

## 2015-05-13 LAB — PREPARE RBC (CROSSMATCH)

## 2015-05-13 LAB — HEMOGLOBIN AND HEMATOCRIT, BLOOD
HCT: 21.4 % — ABNORMAL LOW (ref 36.0–46.0)
Hemoglobin: 7.4 g/dL — ABNORMAL LOW (ref 12.0–15.0)

## 2015-05-13 LAB — LACTIC ACID, PLASMA: Lactic Acid, Venous: 0.8 mmol/L (ref 0.5–2.0)

## 2015-05-13 MED ORDER — DEXTROSE 50 % IV SOLN
INTRAVENOUS | Status: AC
  Administered 2015-05-13: 50 mL
  Filled 2015-05-13: qty 50

## 2015-05-13 MED ORDER — PREGABALIN 25 MG PO CAPS
150.0000 mg | ORAL_CAPSULE | Freq: Three times a day (TID) | ORAL | Status: DC
  Administered 2015-05-13 – 2015-05-15 (×6): 150 mg via ORAL
  Filled 2015-05-13 (×6): qty 1

## 2015-05-13 MED ORDER — SODIUM CHLORIDE 0.9 % IV SOLN
INTRAVENOUS | Status: DC
  Administered 2015-05-13: 22:00:00 via INTRAVENOUS

## 2015-05-13 MED ORDER — FLUOXETINE HCL 20 MG PO CAPS
20.0000 mg | ORAL_CAPSULE | Freq: Every morning | ORAL | Status: DC
  Administered 2015-05-14 – 2015-05-15 (×2): 20 mg via ORAL
  Filled 2015-05-13 (×2): qty 1

## 2015-05-13 MED ORDER — POTASSIUM CHLORIDE CRYS ER 20 MEQ PO TBCR
40.0000 meq | EXTENDED_RELEASE_TABLET | Freq: Once | ORAL | Status: DC
  Filled 2015-05-13: qty 2

## 2015-05-13 MED ORDER — SODIUM CHLORIDE 0.9 % IV SOLN
Freq: Once | INTRAVENOUS | Status: AC
  Administered 2015-05-14: 03:00:00 via INTRAVENOUS

## 2015-05-13 MED ORDER — AMBRISENTAN 5 MG PO TABS
10.0000 mg | ORAL_TABLET | Freq: Every day | ORAL | Status: DC
  Filled 2015-05-13 (×2): qty 2

## 2015-05-13 MED ORDER — AMBRISENTAN 10 MG PO TABS
10.0000 mg | ORAL_TABLET | Freq: Every day | ORAL | Status: DC
  Administered 2015-05-13 – 2015-05-15 (×3): 10 mg via ORAL
  Filled 2015-05-13 (×2): qty 1

## 2015-05-13 MED ORDER — TADALAFIL (PAH) 20 MG PO TABS
40.0000 mg | ORAL_TABLET | Freq: Every day | ORAL | Status: DC
  Administered 2015-05-14 – 2015-05-15 (×2): 40 mg via ORAL
  Filled 2015-05-13 (×2): qty 2

## 2015-05-13 MED ORDER — SELEXIPAG 1600 MCG PO TABS
1600.0000 ug | ORAL_TABLET | Freq: Two times a day (BID) | ORAL | Status: DC
  Administered 2015-05-13 – 2015-05-15 (×3): 1600 ug via ORAL
  Filled 2015-05-13 (×4): qty 1

## 2015-05-13 MED ORDER — AMBRISENTAN 5 MG PO TABS
10.0000 mg | ORAL_TABLET | Freq: Every day | ORAL | Status: DC
  Filled 2015-05-13: qty 2

## 2015-05-13 NOTE — Progress Notes (Signed)
Utilization Review Completed.  

## 2015-05-13 NOTE — Progress Notes (Signed)
Daily Rounding Note  05/13/2015, 9:16 AM  LOS: 1 day   SUBJECTIVE:       Dark , bloody stool overnight.  Pt feels about the same.  C/o of her chronic foot (neuropathic) pain.  No nausea, no sob or chest pain  OBJECTIVE:         Vital signs in last 24 hours:    Temp:  [97.8 F (36.6 C)-98.8 F (37.1 C)] 98 F (36.7 C) (02/28 0756) Pulse Rate:  [65-88] 65 (02/28 0756) Resp:  [12-20] 14 (02/28 0756) BP: (83-106)/(42-62) 95/49 mmHg (02/28 0756) SpO2:  [93 %-100 %] 100 % (02/28 0756) Weight:  [60.646 kg (133 lb 11.2 oz)-64.275 kg (141 lb 11.2 oz)] 64.275 kg (141 lb 11.2 oz) (02/28 0500) Last BM Date: 05/12/15 Filed Weights   05/12/15 2000 05/13/15 0500  Weight: 60.646 kg (133 lb 11.2 oz) 64.275 kg (141 lb 11.2 oz)   General: looks unwell   Heart: RRR Chest: clear bil.  No dyspnea or cough Abdomen: soft, NT, ND.  Active BS  Extremities: no CCE Neuro/Psych:  Oriented to self, place, not year.  Follows all commands, moves all 4s.  Appropriate.   Intake/Output from previous day: 02/27 0701 - 02/28 0700 In: 3231.3 [P.O.:120; I.V.:1111.3; Blood:2000] Out: 1700 [Urine:1000; Stool:700]  Intake/Output this shift:    Lab Results:  Recent Labs  05/12/15 1446 05/12/15 1843 05/13/15 0526 05/13/15 0636  WBC 9.0 13.1* 7.9  --   HGB 4.7* 8.6* 7.1* 7.4*  HCT 15.1* 26.1* 21.1* 21.4*  PLT 202 185 173  --    BMET  Recent Labs  05/12/15 1439 05/12/15 1446 05/13/15 0526  NA 149* 147* 147*  K 3.5 3.6 3.3*  CL 107 109 115*  CO2  --  21* 23  GLUCOSE 74 82 121*  BUN 101* 99* 77*  CREATININE 1.80* 1.88* 1.49*  CALCIUM  --  9.0 8.9   LFT  Recent Labs  05/12/15 1446  PROT 6.6  ALBUMIN 3.1*  AST 18  ALT 8*  ALKPHOS 71  BILITOT 0.3   PT/INR  Recent Labs  05/12/15 1446  LABPROT 18.6*  INR 1.55*   Hepatitis Panel No results for input(s): HEPBSAG, HCVAB, HEPAIGM, HEPBIGM in the last 72  hours.  Studies/Results: Ct Abdomen Pelvis Wo Contrast  05/12/2015  CLINICAL DATA:  GI bleed. EXAM: CT ABDOMEN AND PELVIS WITHOUT CONTRAST TECHNIQUE: Multidetector CT imaging of the abdomen and pelvis was performed following the standard protocol without IV contrast. COMPARISON:  CT scan dated 02/20/2015 FINDINGS: Lower chest: Chronic cardiomegaly. Moderate hiatal hernia with dilated distal esophagus. Hepatobiliary: Multiple benign appearing cysts in the right lobe of the liver, the largest being 17 mm, all unchanged. Liver parenchyma and biliary tree are otherwise normal. Pancreas: Normal. Spleen: Normal. Adrenals/Urinary Tract: Normal. Stomach/Bowel: Moderate hiatal hernia with dilated distal esophagus. The colon appears normal. Minimally prominent small bowel loop in the left mid abdomen. No masses. Vascular/Lymphatic: Scattered calcifications in the abdominal aorta. No adenopathy. Reproductive: Uterus has been removed.  Ovaries are not identified. Other: No free air or free fluid. Musculoskeletal: No significant abnormality. IMPRESSION: 1. Moderate hiatal hernia with dilated distal esophagus. 2. Nonspecific slight prominence of a proximal small bowel loop in the left mid abdomen. 3. No other significant abnormality. Electronically Signed   By: Lorriane Shire M.D.   On: 05/12/2015 18:03   Scheduled Meds: . sodium chloride  10 mL/hr Intravenous Once  . sodium chloride  Intravenous Once  . amiodarone  200 mg Oral Daily  . levothyroxine  25 mcg Oral QAC breakfast  . pantoprazole  40 mg Oral Daily  . sodium chloride  1,000 mL Intravenous Once  . sodium chloride flush  3 mL Intravenous Q12H   Continuous Infusions:  PRN Meds:.acetaminophen, ondansetron **OR** ondansetron (ZOFRAN) IV   ASSESMENT:   * Chronic recurrent GI bleeding with blood loss anemia. Multiple non-bleeding AVMs were ablated on colonoscopy 02/2015, suspect these are culprit and likely has SB AVMs as well.  Anemia symptomatic  with syncope this AM.   Hgb improved post PRBC x 3.    PLAN   *  Trying to arrange for enteroscopy for tomorrow 3/1.  Allow soft diet today. BID CBC  Azucena Freed  05/13/2015, 9:16 AM Pager: [phone]  GI ATTENDING  Interval history data reviewed. Patient seen and examined. Agree with interval progress note. Improved hemoglobin after transfusion. Appears to have low-grade bleeding. Recommend transfusing to desired hemoglobin threshold. Plans for enteroscopy with possible ablation of AVMs tomorrow.  Docia Chuck. Geri Seminole., M.D. [facility] Division of Gastroenterology

## 2015-05-13 NOTE — Progress Notes (Addendum)
Subjective: [name] is a 65 year old woman with PMH of pulmonary HTN w/ cor pulmonale, scleroderma, multiple AVMs w/ hx of GI bleed, DM, HTN, paroxysmal Afib and hypothyroidism with two day hx of dark stools and dizziness.  She presented to the ED on 05/12/15 and was found to have a hgb of 4.7 and was hypotensive to a SBP in the 80s.  She was dmitted by [facility] yesterday afternoon for acute blood loss anemia.  She was fluid resuscitated and her hgb improved to 7.1 after 2 units of PRBCs.  [facility] was called to take over care when it was discovered that she is an [name] patient.  [name] was seen and examined this AM.  She tells me symptoms started on Sunday when she returned home from [name].  She was trying to eat lunch but had abdominal discomfort.  She started having bloody loose stools (about 6 that day).  Yesterday she was still having loose bloody stool and felt dizzy so came to the ED.  She describes hematochezia as opposed to melena, however melena was reported to previous providers.  She reports having 3 loose stools overnight and another loose stool this AM (she is not sure if it was bloody because RNs have been assisting her on bedpan and she is not getting up to commode).  She reports constant abdominal pain x 3 days but cannot localize to a specific location.  She denies recent NSAID or EtOH use.  She denies N/V, chest pain or dyspnea.  She is unsure if she is still lightheaded because she has not gotten up yet.  Objective: Vital signs in last 24 hours: Filed Vitals:   05/12/15 2300 05/13/15 0000 05/13/15 0300 05/13/15 0500  BP: 83/51 93/62 96/52 104/52  Pulse: 77 75 66 67  Temp: 97.9 F (36.6 C)   98.1 F (36.7 C)  TempSrc: Oral   Oral  Resp: _0 Height:      Weight:    141 lb 11.2 oz (64.275 kg)  SpO2: 96% 100% 100% 100%   Weight change:   Intake/Output Summary (Last 24 hours) at 05/13/15 0714 Last data filed at 05/13/15 0553  Gross per 24 hour  Intake  2578.75 ml  Output   1700 ml  Net 878.75 ml   General: resting in bed in NAD HEENT: [place]/AT, dry mucous membranes Cardiac: RRR, no rubs, murmurs or gallops, no JVD Pulm: clear to auscultation bilaterally, moving normal volumes of air Abd: soft, nondistended, BS present, mild diffuse TTP, no rebound or guarding Ext: warm and well perfused, no pedal edema, distal pulses intact and equal B/L Neuro: alert and oriented X3, responding appropriately, following commands, moving extremities spontaneously  Lab Results: Basic Metabolic Panel:  Recent Labs Lab 05/12/15 1446 05/13/15 0526  NA 147* 147*  K 3.6 3.3*  CL 109 115*  CO2 21* 23  GLUCOSE 82 121*  BUN 99* 77*  CREATININE 1.88* 1.49*  CALCIUM 9.0 8.9   CBC:  Recent Labs Lab 05/12/15 1446 05/12/15 1843 05/13/15 0526 05/13/15 0636  WBC 9.0 13.1* 7.9  --   NEUTROABS 6.9  --   --   --   HGB 4.7* 8.6* 7.1* 7.4*  HCT 15.1* 26.1* 21.1* 21.4*  MCV 80.7 82.9 82.1  --   PLT 202 185 173  --    CBG:  Recent Labs Lab 05/12/15 2032 05/12/15 2058 05/12/15 2146 05/13/15 0057 05/13/15 0343  GLUCAP 45* 152* 112* 101* 127*  Medications: I have reviewed the patient's current medications. Scheduled Meds: . sodium chloride  10 mL/hr Intravenous Once  . sodium chloride   Intravenous Once  . amiodarone  200 mg Oral Daily  . insulin aspart  0-9 Units Subcutaneous 6 times per day  . levothyroxine  25 mcg Oral QAC breakfast  . pantoprazole  40 mg Oral Daily  . sodium chloride  1,000 mL Intravenous Once  . sodium chloride flush  3 mL Intravenous Q12H  . torsemide  60 mg Oral BID   Continuous Infusions: . dextrose 5 % and 0.45% NaCl 1,000 mL (05/12/15 2251)   PRN Meds:.acetaminophen, ondansetron **OR** ondansetron (ZOFRAN) IV   Assessment/Plan: 65 year old woman with PMH of pulmonary HTN w/ cor pulmonale, scleroderma, multiple AVMs w/ hx of GI bleed, DM, HTN, paroxysmal Afib and hypothyroidism with two day hx of dark stools and  dizziness.  Acute blood loss anemia 2/2 to GIB:  Baseline hgb 8-9.  She is followed by [name] for severe Fe -deficiency anemia and last received Feraheme on 05/09/15.  Hgb 4.7 --> 7.4 s/p 2 units PRBCs.  Lactic acid 5.51 --> 0.8 after volume resuscitation.  She had a similar admission in Dec 2016.  Colonoscopy at that time revealed multiple non-bleeding AVMs, some (10-15) of which were ablated.  EGD at that time was normal.  She had APC to duodenal AVMs in Jan 2016.   AVMs are likely etiology of her blood loss.  GI suggests she may need endoscopy/enteroscopy during admission. 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  Goal weight appears to be around 144 lbs (based on cardiology note).  She appears a little dry --> euvolemic.  Lungs clear, no edema.  S/p 3.2L intake (IV fluids and PRBCs) since admission and is net + 1.5L.  100% on RA.  Followed by pulmonology ([name]) and HF ([name]).  On Adcirca, ambrisentan, Selexipag, Demadex, Zaroxolyn (prn for wt > 144 lbs).  No home oxygen requirements.  - continue Adcirca, ambrisentan and Selexipag (BP stable) - hold Demadex and Zaroxolyn in the setting of blood loss - supplemental oxygen prn - d/c D5-0.5NS to avoid overload - monitor volume status clinically and w/ strict intake and output, daily weights - re-institute diuretics when able - [name] was notified she has been admitted and he agrees with plan to resume PAH medications and hold diuretics with close monitoring of volume status because she has a propensity for volume overload.  Essential hypertension:  BPs soft in the ED 80-100s/40s-50s.  Hypotension is also noted on her problem list and review of old clinic notes shows that she is at baseline BP. - vitals per protocol and prn  DM type 2  with diabetic peripheral neuropathy: diet controlled with A1c of 5.0 (04/2015).  CBGs 45-152 in past 24 hours.  AM CBG is 127 (on D5-0.5NS since last night).  She has not required any of the SSI that was ordered. - d/c SSI-S - continue to monitor CBGs and resume SSI if needed - continue Lyrica  Paroxysmal atrial fibrillation:  On amiodarone as an outpatient.  No A/C due to bleeding hx.  In NSR currently.  No afib on telemetry. - continue amiodarone  Hypertriglyceridemia:  Hold fenofibrate in the setting of abdominal pain, diarrhea.  GERD:  Continue PPI.  Chronic kidney disease 3: stable at baseline   Hypothyroidism:  Continue Synthroid.  Scleroderma:  stable  Diet:  Clears VTE ppx:  SCDs; no pharmacologic VTE in the setting of GI bleed Code status:  Full  Dispo: Disposition is deferred at this time, awaiting improvement of current medical problems.  Anticipated discharge in approximately 1-2 day(s).   The patient does have a current PCP Sid Falcon, MD) and does need an [name] follow-up appointment after discharge.  The patient does not know have transportation limitations that hinder transportation to clinic appointments.  .Services Needed at time of discharge: Y = Yes, Blank = No PT:   OT:   RN:   Equipment:   Other:     LOS: 1 day   Francesca Oman, DO 05/13/2015, 7:14 AM

## 2015-05-13 NOTE — Progress Notes (Signed)
Reviewed chart-patient follows with Int Medicine Teaching service-spoke with Dr Redmond Pulling of the teaching service-they will assume primary care. Hospitalist service will sign off.

## 2015-05-13 NOTE — Progress Notes (Signed)
Internal Medicine Attending  Date: 05/13/2015  Patient name: [name] Medical record number: [id number] Date of birth: 03-13-1951 Age: 65 y.o. Gender: female  I saw and evaluated the patient. I reviewed the resident's note by Dr. Redmond Pulling and I agree with the resident's findings and plans as documented in her progress note.  [name] was admitted yesterday with acute blood loss anemia presumably from her multiple GI AVMs. She required 3 units of packed red blood cells and her hemoglobin this morning is 7.1 as predicted. She's been seen by GI and they are trying to arrange enteroscopy for tomorrow. When seen on rounds this morning [name] had conjunctival pallor, soft, nontender abdomen, and no visualized AVMs in the oropharynx. We will continue with 2 wide bore IVs, oral PPI therapy, an active type and screen, serial CBCs to follow the hemoglobin and hematocrit, transfuse for hemoglobin less than 7, and make nothing by mouth after midnight for a presumed enteroscopy tomorrow. We will keep her in the step down unit for an additional day awaiting the results of enteroscopy.

## 2015-05-13 NOTE — Progress Notes (Signed)
Patient noted with change in cardiac rhythm on tele monitor. 12 lead obtained with abnormal findings. Text paged Schorr NP.

## 2015-05-14 ENCOUNTER — Encounter (HOSPITAL_COMMUNITY): Payer: Self-pay

## 2015-05-14 ENCOUNTER — Encounter (HOSPITAL_COMMUNITY)
Admission: EM | Disposition: A | Discharge status: Home Health | Payer: Self-pay | Source: Home / Self Care | Attending: Internal Medicine

## 2015-05-14 ENCOUNTER — Inpatient Hospital Stay (HOSPITAL_COMMUNITY): Payer: Medicare Other | Admitting: Anesthesiology

## 2015-05-14 DIAGNOSIS — K5521 Angiodysplasia of colon with hemorrhage: Secondary | ICD-10-CM | POA: Insufficient documentation

## 2015-05-14 DIAGNOSIS — Z9889 Other specified postprocedural states: Secondary | ICD-10-CM

## 2015-05-14 DIAGNOSIS — D500 Iron deficiency anemia secondary to blood loss (chronic): Secondary | ICD-10-CM

## 2015-05-14 HISTORY — PX: ENTEROSCOPY: SHX5533

## 2015-05-14 LAB — GLUCOSE, CAPILLARY
GLUCOSE-CAPILLARY: 141 mg/dL — AB (ref 65–99)
GLUCOSE-CAPILLARY: 48 mg/dL — AB (ref 65–99)
GLUCOSE-CAPILLARY: 49 mg/dL — AB (ref 65–99)
GLUCOSE-CAPILLARY: 83 mg/dL (ref 65–99)
GLUCOSE-CAPILLARY: 96 mg/dL (ref 65–99)
Glucose-Capillary: 147 mg/dL — ABNORMAL HIGH (ref 65–99)
Glucose-Capillary: 27 mg/dL — CL (ref 65–99)
Glucose-Capillary: 36 mg/dL — CL (ref 65–99)
Glucose-Capillary: 37 mg/dL — CL (ref 65–99)
Glucose-Capillary: 44 mg/dL — CL (ref 65–99)
Glucose-Capillary: 63 mg/dL — ABNORMAL LOW (ref 65–99)
Glucose-Capillary: 67 mg/dL (ref 65–99)
Glucose-Capillary: 81 mg/dL (ref 65–99)
Glucose-Capillary: 82 mg/dL (ref 65–99)

## 2015-05-14 LAB — CBC
HCT: 22.7 % — ABNORMAL LOW (ref 36.0–46.0)
HCT: 26.4 % — ABNORMAL LOW (ref 36.0–46.0)
HEMOGLOBIN: 7.8 g/dL — AB (ref 12.0–15.0)
Hemoglobin: 8.4 g/dL — ABNORMAL LOW (ref 12.0–15.0)
MCH: 29.2 pg (ref 26.0–34.0)
MCH: 27.5 pg (ref 26.0–34.0)
MCHC: 34.4 g/dL (ref 30.0–36.0)
MCHC: 31.8 g/dL (ref 30.0–36.0)
MCV: 85 fL (ref 78.0–100.0)
MCV: 86.3 fL (ref 78.0–100.0)
Platelets: 174 10*3/uL (ref 150–400)
Platelets: 169 10*3/uL (ref 150–400)
RBC: 2.67 MIL/uL — AB (ref 3.87–5.11)
RBC: 3.06 MIL/uL — ABNORMAL LOW (ref 3.87–5.11)
RDW: 16.9 % — ABNORMAL HIGH (ref 11.5–15.5)
RDW: 17.6 % — ABNORMAL HIGH (ref 11.5–15.5)
WBC: 7.7 10*3/uL (ref 4.0–10.5)
WBC: 7.5 10*3/uL (ref 4.0–10.5)

## 2015-05-14 SURGERY — ENTEROSCOPY
Anesthesia: Monitor Anesthesia Care

## 2015-05-14 MED ORDER — PHENYLEPHRINE HCL 10 MG/ML IJ SOLN
10.0000 mg | INTRAVENOUS | Status: DC | PRN
  Administered 2015-05-14: 20 ug/min via INTRAVENOUS
  Administered 2015-05-14: 40 ug/min via INTRAVENOUS

## 2015-05-14 MED ORDER — DEXTROSE 50 % IV SOLN: 1.0000 | Freq: Once | INTRAVENOUS | Status: DC

## 2015-05-14 MED ORDER — DEXTROSE 50 % IV SOLN
25.0000 mL | Freq: Once | INTRAVENOUS | Status: AC
  Administered 2015-05-14: 25 mL via INTRAVENOUS

## 2015-05-14 MED ORDER — DEXTROSE 50 % IV SOLN
INTRAVENOUS | Status: AC
  Administered 2015-05-14: 25 mL
  Filled 2015-05-14: qty 50

## 2015-05-14 MED ORDER — PROPOFOL 10 MG/ML IV BOLUS
INTRAVENOUS | Status: DC | PRN
  Administered 2015-05-14 (×4): 20 mg via INTRAVENOUS

## 2015-05-14 MED ORDER — LACTATED RINGERS IV SOLN
INTRAVENOUS | Status: DC
  Administered 2015-05-14: 1000 mL via INTRAVENOUS

## 2015-05-14 MED ORDER — PROPOFOL 500 MG/50ML IV EMUL
INTRAVENOUS | Status: DC | PRN
  Administered 2015-05-14: 50 ug/kg/min via INTRAVENOUS

## 2015-05-14 NOTE — Op Note (Signed)
[facility] [place], [id number]   ENTEROSCOPY PROCEDURE REPORT     EXAM DATE: 05/14/2015  PATIENT NAME:      Pamela, [name]           MR #:      [id number]  BIRTHDATE:       03-02-1951      VISIT #:     [id number]  ATTENDING:     Eustace Quail, MD     STATUS:     outpatient ASSISTANT:      William Dalton and Karie Soda MD: Oval Linsey, M.D. ASA CLASS:        Class III  INDICATIONS:  The patient is a 65 yr old female here for an enteroscopy procedure due to upper G.I.  bleeding and control bleeding. PROCEDURE PERFORMED:     Small bowel enteroscopy with control of bleeding  MEDICATIONS:     Monitored anesthesia care and Per Anesthesia  CONSENT: The patient understands the risks and benefits of the procedure and understands that these risks include, but are not limited to: sedation, allergic reaction, infection, perforation and/or bleeding. Alternative means of evaluation and treatment include, among others: physical exam, x-rays, and/or surgical intervention. The patient elects to proceed with this endoscopic procedure.  DESCRIPTION OF PROCEDURE: During intra-op preparation period all mechanical & medical equipment was checked for proper function. Hand hygiene and appropriate measures for infection prevention was taken. After the risks, benefits and alternatives of the procedure were thoroughly explained, Informed consent was verified, confirmed and timeout was successfully executed by the treatment team. The Pentax VSB-2900 endoscope was introduced through the mouth and advanced to the proximal jejunum jejunum. The prep was The overall prep quality was excellent.. Esophagus was normal.  Stomach was normal.  The duodenal bulb revealed 1 tiny AVM.  Four additional AVMs moderate size were located in the postbulbar duodenum and proximal jejunum.  All visualized AVMs were ablated with APC.  No active bleeding.   No bleeding precipitated.    ADVERSE EVENTS:      There were no immediate complications.  IMPRESSIONS:     1. Multiple small bowel AVMs ablated with APC   RECOMMENDATIONS:     1.  advance diet 2.  transfused to desired hemoglobin 3. Continue close outpatient follow-up with her hematologist and other medical specialists GI will sign off but available if needed. Thank you RECALL:  _____________________________ Eustace Quail, MD eSigned:  Eustace Quail, MD 05/14/2015 1:35 PM   cc:  The Patient     PATIENT NAME:  Pamela, [name] MR#: [id number]

## 2015-05-14 NOTE — Progress Notes (Signed)
Internal Medicine Attending  Date: 05/14/2015  Patient name: [name] Medical record number: [id number] Date of birth: 09/08/1950 Age: 65 y.o. Gender: female  I saw and evaluated the patient. I reviewed the resident's note by Dr. Lovena Le and I agree with the resident's findings and plans as documented in his progress note.  [name] underwent enteroscopy this morning and several small bowel AVMs were identified and treated with APC. We will monitor her hemoglobin to assure stability. We will also discuss where she would like to go after hospital discharge as SNF placement has been recommended by physical therapy. If her hemoglobin remains stable and we can identify where she is willing to go after discharge she may be able to leave the hospital tomorrow.

## 2015-05-14 NOTE — Anesthesia Preprocedure Evaluation (Addendum)
Anesthesia Evaluation  Patient identified by MRN, date of birth, ID band Patient awake    Reviewed: Allergy & Precautions  Airway Mallampati: II  TM Distance: >3 FB Neck ROM: Full    Dental  (+) Missing, Poor Dentition   Pulmonary    breath sounds clear to auscultation       Cardiovascular hypertension, +CHF   Rhythm:Regular Rate:Normal     Neuro/Psych    GI/Hepatic GERD  ,  Endo/Other  diabetesHypothyroidism   Renal/GU Renal disease     Musculoskeletal   Abdominal   Peds  Hematology   Anesthesia Other Findings Uppers worn and broken nearly to gum  Reproductive/Obstetrics                           Anesthesia Physical Anesthesia Plan  ASA: III  Anesthesia Plan: MAC   Post-op Pain Management:    Induction: Intravenous  Airway Management Planned: Simple Face Mask  Additional Equipment:   Intra-op Plan:   Post-operative Plan:   Informed Consent: I have reviewed the patients History and Physical, chart, labs and discussed the procedure including the risks, benefits and alternatives for the proposed anesthesia with the patient or authorized representative who has indicated his/her understanding and acceptance.   Dental advisory given  Plan Discussed with: CRNA and Anesthesiologist  Anesthesia Plan Comments:         Anesthesia Quick Evaluation

## 2015-05-14 NOTE — Progress Notes (Signed)
Called blood lab to inquire about whether any prbc is available to transfuse, apparently blood is ready.  Will now transfuse

## 2015-05-14 NOTE — Transfer of Care (Signed)
Immediate Anesthesia Transfer of Care Note  Patient: [name]  Procedure(s) Performed: Procedure(s): ENTEROSCOPY (N/A)  Patient Location: Endoscopy Unit  Anesthesia Type:MAC  Level of Consciousness: awake, alert  and patient cooperative  Airway & Oxygen Therapy: Patient Spontanous Breathing and Patient connected to nasal cannula oxygen  Post-op Assessment: Report given to RN, Post -op Vital signs reviewed and stable and Patient moving all extremities  Post vital signs: Reviewed and stable  Last Vitals:  Filed Vitals:   05/14/15 1159 05/14/15 1340  BP: 108/39 115/38  Pulse: 60 61  Temp: 36.7 C 36.7 C  Resp: 17 12    Complications: No apparent anesthesia complications

## 2015-05-14 NOTE — Anesthesia Postprocedure Evaluation (Signed)
Anesthesia Post Note  Patient: [name]  Procedure(s) Performed: Procedure(s) (LRB): ENTEROSCOPY (N/A)  Patient location during evaluation: Endoscopy Anesthesia Type: MAC Level of consciousness: awake, awake and alert, oriented and patient cooperative Pain management: pain level controlled Vital Signs Assessment: post-procedure vital signs reviewed and stable Respiratory status: spontaneous breathing, patient connected to nasal cannula oxygen and respiratory function stable Cardiovascular status: blood pressure returned to baseline Postop Assessment: no signs of nausea or vomiting Anesthetic complications: no    Last Vitals:  Filed Vitals:   05/14/15 1159 05/14/15 1340  BP: 108/39 115/38  Pulse: 60 61  Temp: 36.7 C 36.7 C  Resp: 17 12    Last Pain:  Filed Vitals:   05/14/15 1344  PainSc: 5                  Areanna Gengler

## 2015-05-14 NOTE — Evaluation (Signed)
Physical Therapy Evaluation Patient Details Name: [name] MRN: [id number] DOB: [birth year] Today's Date: 05/14/2015   History of Present Illness  [name] is a 65 year old woman with PMH of pulmonary HTN w/ cor pulmonale, scleroderma, multiple AVMs w/ hx of GI bleed, DM, HTN, paroxysmal Afib and hypothyroidism with two day hx of dark stools and dizziness. She presented to the ED on 05/12/15 and was found to have a hgb of 4.7 and was hypotensive to a SBP in the 80s. She was dmitted by [facility] yesterday afternoon for acute blood loss anemia. She was fluid resuscitated and her hgb improved to 7.1 after 2 units of PRBCs. [facility] was called to take over care when it was discovered that she is an [facility] patient.  Clinical Impression  Pt admitted with above diagnosis. Pt currently with functional limitations due to the deficits listed below (see PT Problem List). Pt able to ambulate short distance in hall but was slightly unsteady at times.  Has RW at home but does not use if per sister and pt.  Encouraged pt to use the RW at all times.  Also feel that a short term SNF stay would benefit pt however pt wants to go home and may refuse. Will follow acutely and progrress pt as able.  Pt will benefit from skilled PT to increase their independence and safety with mobility to allow discharge to the venue listed below.      Follow Up Recommendations SNF;Supervision/Assistance - 24 hour (If pt refuses SNF, HHPT,HHOT and HHaide recommended)    Equipment Recommendations  None recommended by PT    Recommendations for Other Services       Precautions / Restrictions Precautions Precautions: Fall Restrictions Weight Bearing Restrictions: No      Mobility  Bed Mobility Overal bed mobility: Independent             General bed mobility comments: took incr time but could get to EOb on her own.   Transfers Overall transfer level: Needs assistance Equipment used: None Transfers: Sit to/from  Stand Sit to Stand: Min guard         General transfer comment: steadying assist needed  Ambulation/Gait Ambulation/Gait assistance: Min assist;Min guard Ambulation Distance (Feet): 110 Feet Assistive device: None Gait Pattern/deviations: Step-through pattern;Decreased stride length;Narrow base of support   Gait velocity interpretation: Below normal speed for age/gender General Gait Details: Pt ambulates well in controlled environment but when challenged loses balance needing min assist without device.  Pt would not use A device although encouraged to do so.  Will try to assist pt in better decisions about her safety.  Poor overall safety awareness.    Stairs            Wheelchair Mobility    Modified Rankin (Stroke Patients Only)       Balance Overall balance assessment: Needs assistance Sitting-balance support: No upper extremity supported;Feet supported Sitting balance-Leahy Scale: Fair     Standing balance support: No upper extremity supported;During functional activity Standing balance-Leahy Scale: Poor Standing balance comment: Needs steadying assist for static stance at EOB upon initial stand as she was slightly unsteady.                              Pertinent Vitals/Pain Pain Assessment: No/denies pain  VSS    Home Living Family/patient expects to be discharged to:: Private residence Living Arrangements: Children;Other relatives Available Help at Discharge: Family;Available  PRN/intermittently (granddaughter a Ship broker in [employer], sister can help) Type of Home: Apartment Home Access: Level entry     Home Layout: One level Home Equipment: [place] - 2 wheels;Cane - single point;Grab bars - tub/shower;Walker - 4 wheels;Bedside commode Additional Comments: Pt alone 11-3 at times.  Sister present and states that pt has had falls.     Prior Function Level of Independence: Independent   Gait / Transfers Assistance Needed: pt states she ambulates  without device.  Sister reports pt has had several falls.  Pt does concur she has fallen.  Gave pt a fall handout.    ADL's / Homemaking Assistance Needed: pt reports she bathed and dressed herself prior to admit.   Comments: Pt also still cooked per pt and sister     Hand Dominance   Dominant Hand: Right    Extremity/Trunk Assessment   Upper Extremity Assessment: Defer to OT evaluation           Lower Extremity Assessment: Generalized weakness      Cervical / Trunk Assessment: Kyphotic  Communication   Communication: No difficulties  Cognition Arousal/Alertness: Awake/alert Behavior During Therapy: WFL for tasks assessed/performed Overall Cognitive Status: Within Functional Limits for tasks assessed                      General Comments      Exercises General Exercises - Lower Extremity Ankle Circles/Pumps: AROM;Both;5 reps;Seated Long Arc Quad: AROM;Both;5 reps;Seated      Assessment/Plan    PT Assessment Patient needs continued PT services  PT Diagnosis Generalized weakness   PT Problem List Decreased balance;Decreased activity tolerance;Decreased mobility;Decreased knowledge of use of DME;Decreased safety awareness;Decreased knowledge of precautions  PT Treatment Interventions DME instruction;Gait training;Functional mobility training;Therapeutic exercise;Therapeutic activities;Balance training;Patient/family education   PT Goals (Current goals can be found in the Care Plan section) Acute Rehab PT Goals Patient Stated Goal: to go home per pt PT Goal Formulation: With patient Time For Goal Achievement: 05/28/15 Potential to Achieve Goals: Good    Frequency Min 3X/week   Barriers to discharge Decreased caregiver support      Co-evaluation               End of Session Equipment Utilized During Treatment: Gait belt Activity Tolerance: Patient limited by fatigue Patient left: with call bell/phone within reach;with family/visitor  present;in chair;with nursing/sitter in room (tech taking pt to endo and PT assisted pt to chair) Nurse Communication: Mobility status         Time: 7510-2585 PT Time Calculation (min) (ACUTE ONLY): 24 min   Charges:   PT Evaluation $PT Eval Moderate Complexity: 1 Procedure PT Treatments $Gait Training: 8-22 mins   PT G CodesDenice [name] June 07, 2015, 1:27 PM  [address] [name],PT Acute Rehabilitation [phone] (pager)

## 2015-05-14 NOTE — H&P (View-Only) (Signed)
Daily Rounding Note  05/13/2015, 9:16 AM  LOS: 1 day   SUBJECTIVE:       Dark , bloody stool overnight.  Pt feels about the same.  C/o of her chronic foot (neuropathic) pain.  No nausea, no sob or chest pain  OBJECTIVE:         Vital signs in last 24 hours:    Temp:  [97.8 F (36.6 C)-98.8 F (37.1 C)] 98 F (36.7 C) (02/28 0756) Pulse Rate:  [65-88] 65 (02/28 0756) Resp:  [12-20] 14 (02/28 0756) BP: (83-106)/(42-62) 95/49 mmHg (02/28 0756) SpO2:  [93 %-100 %] 100 % (02/28 0756) Weight:  [60.646 kg (133 lb 11.2 oz)-64.275 kg (141 lb 11.2 oz)] 64.275 kg (141 lb 11.2 oz) (02/28 0500) Last BM Date: 05/12/15 Filed Weights   05/12/15 2000 05/13/15 0500  Weight: 60.646 kg (133 lb 11.2 oz) 64.275 kg (141 lb 11.2 oz)   General: looks unwell   Heart: RRR Chest: clear bil.  No dyspnea or cough Abdomen: soft, NT, ND.  Active BS  Extremities: no CCE Neuro/Psych:  Oriented to self, place, not year.  Follows all commands, moves all 4s.  Appropriate.   Intake/Output from previous day: 02/27 0701 - 02/28 0700 In: 3231.3 [P.O.:120; I.V.:1111.3; Blood:2000] Out: 1700 [Urine:1000; Stool:700]  Intake/Output this shift:    Lab Results:  Recent Labs  05/12/15 1446 05/12/15 1843 05/13/15 0526 05/13/15 0636  WBC 9.0 13.1* 7.9  --   HGB 4.7* 8.6* 7.1* 7.4*  HCT 15.1* 26.1* 21.1* 21.4*  PLT 202 185 173  --    BMET  Recent Labs  05/12/15 1439 05/12/15 1446 05/13/15 0526  NA 149* 147* 147*  K 3.5 3.6 3.3*  CL 107 109 115*  CO2  --  21* 23  GLUCOSE 74 82 121*  BUN 101* 99* 77*  CREATININE 1.80* 1.88* 1.49*  CALCIUM  --  9.0 8.9   LFT  Recent Labs  05/12/15 1446  PROT 6.6  ALBUMIN 3.1*  AST 18  ALT 8*  ALKPHOS 71  BILITOT 0.3   PT/INR  Recent Labs  05/12/15 1446  LABPROT 18.6*  INR 1.55*   Hepatitis Panel No results for input(s): HEPBSAG, HCVAB, HEPAIGM, HEPBIGM in the last 72  hours.  Studies/Results: Ct Abdomen Pelvis Wo Contrast  05/12/2015  CLINICAL DATA:  GI bleed. EXAM: CT ABDOMEN AND PELVIS WITHOUT CONTRAST TECHNIQUE: Multidetector CT imaging of the abdomen and pelvis was performed following the standard protocol without IV contrast. COMPARISON:  CT scan dated 02/20/2015 FINDINGS: Lower chest: Chronic cardiomegaly. Moderate hiatal hernia with dilated distal esophagus. Hepatobiliary: Multiple benign appearing cysts in the right lobe of the liver, the largest being 17 mm, all unchanged. Liver parenchyma and biliary tree are otherwise normal. Pancreas: Normal. Spleen: Normal. Adrenals/Urinary Tract: Normal. Stomach/Bowel: Moderate hiatal hernia with dilated distal esophagus. The colon appears normal. Minimally prominent small bowel loop in the left mid abdomen. No masses. Vascular/Lymphatic: Scattered calcifications in the abdominal aorta. No adenopathy. Reproductive: Uterus has been removed.  Ovaries are not identified. Other: No free air or free fluid. Musculoskeletal: No significant abnormality. IMPRESSION: 1. Moderate hiatal hernia with dilated distal esophagus. 2. Nonspecific slight prominence of a proximal small bowel loop in the left mid abdomen. 3. No other significant abnormality. Electronically Signed   By: Lorriane Shire M.D.   On: 05/12/2015 18:03   Scheduled Meds: . sodium chloride  10 mL/hr Intravenous Once  . sodium chloride  Intravenous Once  . amiodarone  200 mg Oral Daily  . levothyroxine  25 mcg Oral QAC breakfast  . pantoprazole  40 mg Oral Daily  . sodium chloride  1,000 mL Intravenous Once  . sodium chloride flush  3 mL Intravenous Q12H   Continuous Infusions:  PRN Meds:.acetaminophen, ondansetron **OR** ondansetron (ZOFRAN) IV   ASSESMENT:   * Chronic recurrent GI bleeding with blood loss anemia. Multiple non-bleeding AVMs were ablated on colonoscopy 02/2015, suspect these are culprit and likely has SB AVMs as well.  Anemia symptomatic  with syncope this AM.   Hgb improved post PRBC x 3.    PLAN   *  Trying to arrange for enteroscopy for tomorrow 3/1.  Allow soft diet today. BID CBC  [name]  05/13/2015, 9:16 AM Pager: [phone]  GI ATTENDING  Interval history data reviewed. Patient seen and examined. Agree with interval progress note. Improved hemoglobin after transfusion. Appears to have low-grade bleeding. Recommend transfusing to desired hemoglobin threshold. Plans for enteroscopy with possible ablation of AVMs tomorrow.  Docia Chuck. Geri Seminole., M.D. [facility] Division of Gastroenterology

## 2015-05-14 NOTE — Progress Notes (Signed)
Subjective: [name].  [name] afternoon CBC yesterday demonstrated Hgb 6.6.  She was transfused 1 U pRBC.  This morning, she still feels "not great."  She denies lightheadedness, dizziness, chest pain, or SOB.  She reports last BM Saturday.  Objective: Vital signs in last 24 hours: Filed Vitals:   05/14/15 0341 05/14/15 0346 05/14/15 0555 05/14/15 0741  BP:  90/51 102/52 97/50  Pulse:    64  Temp:  98.2 F (36.8 C) 98.1 F (36.7 C) 98 F (36.7 C)  TempSrc:  Oral Oral Oral  Resp:  _0 Height:      Weight: 141 lb (63.957 kg)     SpO2:  99% 99% 100%   Weight change: 7 lb 4.8 oz (3.311 kg)  Intake/Output Summary (Last 24 hours) at 05/14/15 0816 Last data filed at 05/14/15 0700  Gross per 24 hour  Intake 462.33 ml  Output    175 ml  Net 287.33 ml   General: resting in bed in NAD HEENT: [place]/AT, dry mucous membranes, pale conjuctiva Cardiac: RRR, grade II/VI systolic decrescendo murmur heard best at RUSB Pulm: clear to auscultation bilaterally, moving normal volumes of air Abd: soft, nondistended, BS present, mild diffuse TTP, no rebound or guarding Ext: warm and well perfused, no pedal edema, distal pulses intact and equal B/L Neuro: alert and oriented X3, responding appropriately, following commands, moving extremities spontaneously  Lab Results: Basic Metabolic Panel:  Recent Labs Lab 05/12/15 1446 05/13/15 0526  NA 147* 147*  K 3.6 3.3*  CL 109 115*  CO2 21* 23  GLUCOSE 82 121*  BUN 99* 77*  CREATININE 1.88* 1.49*  CALCIUM 9.0 8.9   CBC:  Recent Labs Lab 05/12/15 1446  05/13/15 0526 05/13/15 0636 05/13/15 1820  WBC 9.0  < > 7.9  --  8.4  NEUTROABS 6.9  --   --   --   --   HGB 4.7*  < > 7.1* 7.4* 6.6*  HCT 15.1*  < > 21.1* 21.4* 20.1*  MCV 80.7  < > 82.1  --  82.4  PLT 202  < > 173  --  195  < > = values in this interval not displayed. CBG:  Recent Labs Lab 05/13/15 1428 05/13/15 1647 05/13/15 2013 05/14/15 0014 05/14/15 0345  05/14/15 0739  GLUCAP 107* 95 114* 96 141* 82   Medications: I have reviewed the patient's current medications. Scheduled Meds: . ambrisentan  10 mg Oral Daily  . amiodarone  200 mg Oral Daily  . FLUoxetine  20 mg Oral q morning - 10a  . levothyroxine  25 mcg Oral QAC breakfast  . pantoprazole  40 mg Oral Daily  . potassium chloride  40 mEq Oral Once  . pregabalin  150 mg Oral TID  . Selexipag  1,600 mcg Oral BID  . sodium chloride  1,000 mL Intravenous Once  . sodium chloride flush  3 mL Intravenous Q12H  . Tadalafil (PAH)  40 mg Oral Daily   Continuous Infusions: . sodium chloride 20 mL/hr at 05/13/15 2150   PRN Meds:.acetaminophen, ondansetron **OR** ondansetron (ZOFRAN) IV   Assessment/Plan: [name] is a 65 year old woman with PMH of pulmonary HTN w/ cor pulmonale, scleroderma, multiple AVMs w/ hx of GI bleed, DM, HTN, paroxysmal Afib and hypothyroidism with two day hx of dark stools and dizziness.  Acute blood loss anemia 2/2 to Bleeding AVMs:  Colonoscopy in December 2016 for similar presentation revealed multiple non-bleeding AVMs, some (10-15) of which  were ablated.  EGD at that time was normal.  She had APC to duodenal AVMs in Jan 2016.  GI planning for enteroscopy today. 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  Goal weight appears to be around 144 lbs (based on cardiology note).  Lungs clear, no edema.  Followed by pulmonology (Dr. Lamonte Sakai) and HF (Dr. Aundra Dubin).  On Adcirca, ambrisentan, Selexipag, Demadex, Zaroxolyn (prn for wt > 144 lbs).  No home oxygen requirements.  - Continue Adcirca, ambrisentan and Selexipag (BP stable) - HOLD Demadex and Zaroxolyn in the setting of blood loss - Strict I/Os, daily weights - Dr. Aundra Dubin agrees with plan to resume PAH medications and hold diuretics with close monitoring of volume  status because she has a propensity for volume overload.  Essential hypertension, stable:  BPs soft in the ED 80-100s/40s-50s.  Hypotension is also noted on her problem list and review of old clinic notes shows that she is at baseline BP.  DM type 2 with diabetic peripheral neuropathy: diet controlled with A1c of 5.0 (04/2015).  CBGs 45-152 in past 24 hours.  AM CBG is 127 (on D5-0.5NS since last night).  She has not required any of the SSI that was ordered. - Lyrica  PAF:  On amiodarone as an outpatient.  No A/C due to bleeding hx.  In NSR currently.  No afib on telemetry. - Amiodarone  Hypertriglyceridemia:  Hold fenofibrate in the setting of abdominal pain, diarrhea. GERD:  Continue PPI. CKD3: stable at baseline Hypothyroidism:  Continue Synthroid. Scleroderma:  stable  Diet:  Clears VTE ppx:  SCDs; no pharmacologic VTE in the setting of GI bleed Code status:  Full  Dispo: Disposition is deferred at this time, awaiting improvement of current medical problems.  Anticipated discharge in approximately 1-2 day(s).   The patient does have a current PCP Sid Falcon, MD) and does need an [facility] hospital follow-up appointment after discharge.  The patient does not know have transportation limitations that hinder transportation to clinic appointments.  .Services Needed at time of discharge: Y = Yes, Blank = No PT:   OT:   RN:   Equipment:   Other:     LOS: 2 days   Iline Oven, MD 05/14/2015, 8:16 AM

## 2015-05-14 NOTE — Interval H&P Note (Signed)
History and Physical Interval Note:  05/14/2015 12:08 PM  [name] [name]  has presented today for surgery, with the diagnosis of anemia,  blood in stool  The various methods of treatment have been discussed with the patient and family. After consideration of risks, benefits and other options for treatment, the patient has consented to  Procedure(s): ENTEROSCOPY (N/A) as a surgical intervention .  The patient's history has been reviewed, patient examined, no change in status, stable for surgery.  I have reviewed the patient's chart and labs.  Questions were answered to the patient's satisfaction.     [name]

## 2015-05-15 ENCOUNTER — Encounter (HOSPITAL_COMMUNITY): Payer: Self-pay | Admitting: Internal Medicine

## 2015-05-15 LAB — CBC
HCT: 24.3 % — ABNORMAL LOW (ref 36.0–46.0)
HEMATOCRIT: 27 % — AB (ref 36.0–46.0)
HEMOGLOBIN: 8.5 g/dL — AB (ref 12.0–15.0)
Hemoglobin: 7.7 g/dL — ABNORMAL LOW (ref 12.0–15.0)
MCH: 27.6 pg (ref 26.0–34.0)
MCH: 27.7 pg (ref 26.0–34.0)
MCHC: 31.7 g/dL (ref 30.0–36.0)
MCHC: 31.5 g/dL (ref 30.0–36.0)
MCV: 87.1 fL (ref 78.0–100.0)
MCV: 87.9 fL (ref 78.0–100.0)
PLATELETS: 192 10*3/uL (ref 150–400)
PLATELETS: 204 10*3/uL (ref 150–400)
RBC: 2.79 MIL/uL — ABNORMAL LOW (ref 3.87–5.11)
RBC: 3.07 MIL/uL — ABNORMAL LOW (ref 3.87–5.11)
RDW: 18.2 % — AB (ref 11.5–15.5)
RDW: 18.8 % — AB (ref 11.5–15.5)
WBC: 9.9 10*3/uL (ref 4.0–10.5)
WBC: 7.3 10*3/uL (ref 4.0–10.5)

## 2015-05-15 LAB — GLUCOSE, CAPILLARY
GLUCOSE-CAPILLARY: 85 mg/dL (ref 65–99)
GLUCOSE-CAPILLARY: 107 mg/dL — AB (ref 65–99)
GLUCOSE-CAPILLARY: 48 mg/dL — AB (ref 65–99)
GLUCOSE-CAPILLARY: 111 mg/dL — AB (ref 65–99)
Glucose-Capillary: 40 mg/dL — CL (ref 65–99)
Glucose-Capillary: 103 mg/dL — ABNORMAL HIGH (ref 65–99)
Glucose-Capillary: 90 mg/dL (ref 65–99)
Glucose-Capillary: 96 mg/dL (ref 65–99)

## 2015-05-15 LAB — BASIC METABOLIC PANEL
Anion gap: 10 (ref 5–15)
BUN: 33 mg/dL — AB (ref 6–20)
CALCIUM: 9 mg/dL (ref 8.9–10.3)
CO2: 21 mmol/L — AB (ref 22–32)
Chloride: 111 mmol/L (ref 101–111)
Creatinine, Ser: 1.2 mg/dL — ABNORMAL HIGH (ref 0.44–1.00)
GFR calc Af Amer: 55 mL/min — ABNORMAL LOW (ref >60)
GFR, EST NON AFRICAN AMERICAN: 47 mL/min — AB (ref >60)
GLUCOSE: 89 mg/dL (ref 65–99)
POTASSIUM: 4.1 mmol/L (ref 3.5–5.1)
Sodium: 142 mmol/L (ref 135–145)

## 2015-05-15 MED ORDER — TORSEMIDE 20 MG PO TABS
60.0000 mg | ORAL_TABLET | Freq: Two times a day (BID) | ORAL | Status: DC
  Administered 2015-05-15: 60 mg via ORAL
  Filled 2015-05-15 (×2): qty 3

## 2015-05-15 MED ORDER — METOLAZONE 2.5 MG PO TABS
2.5000 mg | ORAL_TABLET | ORAL | Status: DC
  Administered 2015-05-15: 2.5 mg via ORAL
  Filled 2015-05-15: qty 1

## 2015-05-15 NOTE — Care Management Note (Signed)
Case Management Note  Patient Details  Name: [name] MRN: [id number] Date of Birth: [birth year]  Subjective/Objective:   Pt lives with granddaughter who is in school 11A-3P.   PT recommended SNF for rehab but pt refuses, wants to d/c home with home health services.  Provided list of agencies in [facility] and pt has no preference.  Referral called to [facility] for home health PT, OT, and aide.                      Expected Discharge Plan:  [place]  Discharge planning Services  CM Consult  Post Acute Care Choice:  Home Health Choice offered to:  Patient HH Arranged:  PT, OT, Nurse's Aide [place] Agency:  [place]  Status of Service:  Completed, signed off  Girard Cooter, [place] 05/15/2015, 9:51 AM

## 2015-05-15 NOTE — Progress Notes (Signed)
Internal Medicine Attending  Date: 05/15/2015  Patient name: [name] Medical record number: [id number] Date of birth: Oct 18, 1950 Age: 65 y.o. Gender: female  I saw and evaluated the patient. I reviewed the resident's note by Dr. Lovena Le and I agree with the resident's findings and plans as documented in his progress note.  Ms. Campi was without specific complaints this morning when seen on rounds. Her hemoglobin dropped slightly overnight to 7.7. She also had a bloody bowel movement suggesting continued bleeding from her colonic AVMs. It may be that long-term management requires monitoring of her hemoglobin on a frequent basis and periodic outpatient transfusions with a goal of keeping her hemoglobin greater than 7. We will repeat the hemoglobin this afternoon and if it remains 7.5 or greater we will discharge her home today. If it falls, even if it's above 7.0 we will transfuse with 2 units of blood. We will then discharge home the following morning assuming a relatively stable hemoglobin with follow-up early to mid next week in clinic with a hemoglobin check. At that visit we can start setting up a mechanism so that she is able to electively get blood transfusions in the outpatient setting.

## 2015-05-15 NOTE — Progress Notes (Signed)
Hypoglycemic Event  CBG: 48  Treatment: D50 IV 25 mL  Symptoms: None  Follow-up CBG: Time:2018 CBG Result:37  Possible Reasons for Event: Inadequate meal intake  Comments/MD notified:Yes. IMTS at bedside now.     Shanon Payor

## 2015-05-15 NOTE — Progress Notes (Signed)
Hypoglycemic Event  CBG: 48  Treatment: 15 GM carbohydrate snack  Symptoms:none Follow-up CBG: Time:1235 CBG Result:107  Possible Reasons for Event: Unknown  Comments/MD notified:MD aware of previous hypoglycemic event.    [name], [name]

## 2015-05-15 NOTE — Discharge Instructions (Signed)
1. Call the clinic with the first sign of bloody bowel movements.  You will likely need regular blood transfusions. 2. Follow up in clinic on March 7th to recheck your hemoglobin. 3. Continue Esomeprazole (Nexium) 1 tab twice daily.

## 2015-05-15 NOTE — Discharge Summary (Signed)
Name: [name] MRN: [id number] DOB: 08-13-1950 65 y.o. PCP: Pamela Falcon, MD  Date of Admission: 05/12/2015  1:01 PM Date of Discharge: 05/15/2015 Attending Physician: No att. providers found  Discharge Diagnosis: 1. 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 CALL [phone] FOR REFILLS.     ambrisentan 10 MG tablet  Commonly known as:  LETAIRIS  Take 1 tablet (10 mg total) by mouth daily.     amiodarone 200 MG tablet  Commonly known as:  PACERONE  Take 1 tablet (200 mg total) by mouth daily.     esomeprazole 40 MG capsule  Commonly known as:  NEXIUM  Take 40 mg by mouth 2 (two) times daily.     fenofibrate 54 MG tablet  Take 1 tablet (54 mg total) by mouth daily.     FLUoxetine 20 MG capsule  Commonly known as:  PROZAC  take 1 capsule by mouth every morning     levothyroxine 25 MCG tablet  Commonly known as:  SYNTHROID, LEVOTHROID  Take 1 tablet (25 mcg total) by mouth daily before breakfast.     metolazone 2.5 MG tablet    Commonly known as:  ZAROXOLYN  Take 1 tablet (2.5 mg total) by mouth 2 (two) times a week. (Tuesdays and Fridays)     nortriptyline 10 MG capsule  Commonly known as:  PAMELOR  Take 2 capsules (20 mg total) by mouth at bedtime.     potassium chloride 10 MEQ tablet  Commonly known as:  K-DUR,KLOR-CON  Take 2 tablets (20 mEq total) by mouth daily. Take additional 20 meQ with metolazone doses     pregabalin 150 MG capsule  Commonly known as:  LYRICA  Take 1 capsule (150 mg total) by mouth 3 (three) times daily.     promethazine 12.5 MG tablet  Commonly known as:  PHENERGAN  Take 12.5 mg by mouth every 6 (six) hours as needed for nausea or vomiting.     Selexipag 1600 MCG Tabs  Commonly known as:  UPTRAVI  Take 1,600 mcg by mouth 2 (two) times daily.     torsemide 20 MG tablet  Commonly known as:  DEMADEX  Take 60 mg by mouth 2 (two) times daily.        Disposition and follow-up:   [name] was discharged from [facility] in Stable condition.  At the hospital follow up visit please address:  1.  Continued bloody BMs, Hgb stable, need for scheduled transfusions, Protonix adherence  2.  Labs / imaging needed at time of follow-up: CBC  3.  Pending labs/ test needing follow-up: none  Follow-up Appointments: Follow-up Information    Follow up with Pamela Nims, MD On 05/20/2015.   Specialty:  Internal Medicine   Why:  3:45p   Contact information:   [place] [address] [phone]       Discharge Instructions: Discharge Instructions    Call MD for:  difficulty breathing, headache or visual disturbances    Complete by:  As directed      Call MD for:  extreme fatigue    Complete by:  As directed      Call MD for:  persistant dizziness or light-headedness    Complete by:  As directed      Diet - low sodium heart healthy    Complete by:  As directed      Increase activity slowly    Complete by:  As directed             Consultations:    Procedures Performed:  Ct Abdomen Pelvis Wo Contrast  05/12/2015  CLINICAL DATA:  GI bleed. EXAM: CT ABDOMEN AND PELVIS WITHOUT CONTRAST TECHNIQUE: Multidetector CT imaging of the abdomen and pelvis was performed following the standard protocol without IV contrast. COMPARISON:  CT scan dated 02/20/2015 FINDINGS: Lower chest: Chronic cardiomegaly. Moderate hiatal hernia with dilated distal esophagus. Hepatobiliary: Multiple benign appearing cysts in the right lobe of the liver, the largest being 17 mm, all unchanged. Liver parenchyma and biliary tree are otherwise normal. Pancreas: Normal. Spleen: Normal. Adrenals/Urinary Tract: Normal. Stomach/Bowel: Moderate hiatal hernia with dilated distal esophagus. The colon appears normal. Minimally prominent small bowel loop in the left mid abdomen. No masses. Vascular/Lymphatic: Scattered calcifications in the abdominal aorta. No adenopathy. Reproductive: Uterus has been removed.  Ovaries are not identified. Other: No free air or free fluid. Musculoskeletal: No significant abnormality. IMPRESSION: 1. Moderate hiatal hernia with dilated distal esophagus. 2. Nonspecific slight prominence of a proximal small bowel loop in the left mid abdomen. 3. No other significant abnormality. Electronically Signed   By: Lorriane Shire M.D.   On: 05/12/2015 18:03    2D Echo:   Cardiac Cath:   Admission HPI: [name] is a 65 y.o. female with a history of scleroderma/systemic sclerosis, severe pulmonary hypertension/cor pulmonale, diastolic heart failure, diabetes, anemia, right kidney disease, hypothyroidism presents to the emergency department the chief complaint of recurrent dark and bloody stools. Initial evaluation in the emergency department reveals acute blood loss anemia, elevated lactic acid, hypotension and 2 episodes of large amount of melena.  Information is obtained from the patient and the sisters who are at the bedside. Reportedly  symptoms started yesterday with "dark" stools. He slept the night but awakened this morning had persistent dark stools makes with bright red blood per rectum. Associated symptoms include lightheadedness, dizziness and near syncope as well as nausea without vomiting. In addition she complained of diffuse abdominal pain characterized a sharp located throughout nothing makes it better or worse. EMS was called she was given 500 mL of normal saline and Zofran for nausea  In the emergency department she is afebrile with a systolic blood pressure range of 80-106 she's not tachycardic she's not hypoxic. She received 2 L normal saline and 2 units of packed red blood cells swell as [facility] by problem list: Principal  Problem:   AVM (arteriovenous malformation) of duodenum, acquired with hemorrhage Active Problems:   DM type 2 with diabetic peripheral neuropathy (HCC)   Hypertriglyceridemia   Essential hypertension   GERD   SCLERODERMA   Hypotension   PAH (pulmonary artery hypertension) (HCC)   CKD (chronic kidney disease), stage III   Hypothyroidism   PAF (paroxysmal atrial fibrillation) (HCC)   Chronic diastolic CHF (congestive heart failure) (HCC)   Iron deficiency anemia   Acute blood loss anemia   GI bleed   Elevated lactic acid level   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 She has a history of large bowel AVMs s/p ablation of numerous, but not all, lesions seen on colonoscopy in Dec 2016.  Patient was transfused to maintain hemoglobin >7.  Enteroscopy was performed and showed multiple small bowel, nonbleeding AVMs, which were ablated.  Her hemoglobin remained stable after enteroscopy.  However, due to the number of AVMs previously seen on colonoscopy, GI consultation has a high threshold for repeat colonoscopy.  Therefore, patient will  likely continue to experience repeat hematochezia/melena and acute anemia.  Patient will need regular close follow up and prophylactic blood transfusions when she notices repeat episodes.  Physical therapy recommended SNF placement, but patient refused.  She was discharged home with PT, OT, and aide.  Discharge Vitals:   BP 89/44 mmHg  Pulse 59  Temp(Src) 98.2 F (36.8 C) (Oral)  Resp 18  Ht _0  (1.626 m)  Wt 145 lb 12.8 oz (66.134 kg)  BMI 25.01 kg/m2  SpO2 100%  Discharge Labs:  Results for orders placed or performed during the hospital encounter of 05/12/15 (from the past 24 hour(s))  Glucose, capillary     Status: Abnormal   Collection Time: 05/15/15 10:40 AM  Result Value Ref Range   Glucose-Capillary 107 (H) 65 - 99 mg/dL  Glucose, capillary     Status: Abnormal   Collection Time: 05/15/15 12:25 PM  Result Value Ref Range   Glucose-Capillary 48 (L) 65 - 99 mg/dL  CBC     Status: Abnormal   Collection Time: 05/15/15  2:44 PM  Result Value Ref Range   WBC 7.3 4.0 - 10.5 K/uL   RBC 3.07 (L) 3.87 - 5.11 MIL/uL   Hemoglobin 8.5 (L) 12.0 - 15.0 g/dL   HCT 27.0 (L) 36.0 - 46.0 %   MCV 87.9 78.0 - 100.0 fL   MCH 27.7 26.0 - 34.0 pg   MCHC 31.5 30.0 - 36.0 g/dL   RDW 18.8 (H) 11.5 - 15.5 %   Platelets 204 150 - 400 K/uL  Glucose, capillary     Status: Abnormal   Collection Time: 05/15/15  3:20 PM  Result Value Ref Range   Glucose-Capillary 111 (H) 65 - 99 mg/dL   *Note: Due to a large number of results and/or encounters for the requested time period, some results have not been displayed. A complete set of results can be found in Results Review.    Signed: Iline Oven, MD 05/16/2015, 10:20 AM    Services Ordered on Discharge: [facility] PT, OT, aide Equipment Ordered on Discharge: none

## 2015-05-15 NOTE — Progress Notes (Signed)
Subjective: [name].  Patient tolerated enteroscopy well yesterday with ablation of multiple small bowel AVMs.  She has since tolerated regular diet without nausea or vomiting.  She had a BM early this morning, which she reports was bloody.  Objective: Vital signs in last 24 hours: Filed Vitals:   05/15/15 0000 05/15/15 0009 05/15/15 0600 05/15/15 0800  BP: 100/54 94/43  93/42  Pulse: 64   59  Temp:  98.6 F (37 C)  98.2 F (36.8 C)  TempSrc:  Oral  Oral  Resp: 14   16  Height:      Weight:   145 lb 12.8 oz (66.134 kg)   SpO2: 98%   96%   Weight change: 0 lb (0 kg)  Intake/Output Summary (Last 24 hours) at 05/15/15 0927 Last data filed at 05/15/15 0100  Gross per 24 hour  Intake   2513 ml  Output    852 ml  Net   1661 ml   General: resting in bed in NAD HEENT: [place]/AT, dry mucous membranes, pale conjuctiva Cardiac: RRR, grade II/VI systolic decrescendo murmur heard best at RUSB Pulm: clear to auscultation bilaterally, moving normal volumes of air Abd: soft, nondistended, BS present, nontender, no rebound or guarding Ext: warm and well perfused, no pedal edema, distal pulses intact and equal B/L Neuro: alert and oriented X3, responding appropriately, following commands, moving extremities spontaneously  Lab Results: Basic Metabolic Panel:  Recent Labs Lab 05/13/15 0526 05/15/15 0500  NA 147* 142  K 3.3* 4.1  CL 115* 111  CO2 23 21*  GLUCOSE 121* 89  BUN 77* 33*  CREATININE 1.49* 1.20*  CALCIUM 8.9 9.0   CBC:  Recent Labs Lab 05/12/15 1446  05/14/15 1755 05/15/15 0500  WBC 9.0  < > 7.5 9.9  NEUTROABS 6.9  --   --   --   HGB 4.7*  < > 8.4* 7.7*  HCT 15.1*  < > 26.4* 24.3*  MCV 80.7  < > 86.3 87.1  PLT 202  < > 169 192  < > = values in this interval not displayed. CBG:  Recent Labs Lab 05/14/15 2145 05/14/15 2344 05/15/15 0159 05/15/15 0509 05/15/15 0620 05/15/15 0856  GLUCAP 67 81 103* 90 85 96   Medications: I have reviewed the patient's  current medications. Scheduled Meds: . ambrisentan  10 mg Oral Daily  . amiodarone  200 mg Oral Daily  . FLUoxetine  20 mg Oral q morning - 10a  . levothyroxine  25 mcg Oral QAC breakfast  . metolazone  2.5 mg Oral Once per day on Mon Thu  . pantoprazole  40 mg Oral Daily  . potassium chloride  40 mEq Oral Once  . pregabalin  150 mg Oral TID  . Selexipag  1,600 mcg Oral BID  . sodium chloride  1,000 mL Intravenous Once  . sodium chloride flush  3 mL Intravenous Q12H  . Tadalafil (PAH)  40 mg Oral Daily  . torsemide  60 mg Oral BID   Continuous Infusions:   PRN Meds:.acetaminophen, ondansetron **OR** ondansetron (ZOFRAN) IV   Assessment/Plan: 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  Acute blood loss anemia 2/2 to Bleeding AVMs:  Colonoscopy in December 2016 for similar presentation revealed multiple non-bleeding AVMs, some (10-15) of which were ablated.  EGD at  that time was normal.  She had APC to duodenal AVMs in Jan 2016. Multiple small bowel AVMs ablated.  Tolerating diet.  Hgb this morning stable, but patient had a bloody bowel movement after enteroscopy.  Unfortunately, continued bleed after enteroscopy suggests lower GI source, especially in setting of multiple untreated AVMs seen on previous colonoscopy.  As GI does not plan intervention, she will need close monitoring for repeat bleeding episodes.  Will need to ensure afternoon Hgb stable before discharge.  Patient refusing SNF placement. Therefore, dispo will be home with home health and close PCP follow up for Hgb recheck. 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  Goal weight appears to be around 144 lbs (based on cardiology note).  Lungs clear, no edema.  Followed by pulmonology (Dr. Lamonte Sakai) and  HF (Dr. Aundra Dubin).  On Adcirca, ambrisentan, Selexipag, Demadex, Zaroxolyn (prn for wt > 144 lbs).  No home oxygen requirements.  - Continue Adcirca, ambrisentan and Selexipag (BP stable) - Restart Demadex and Zaroxolyn - Strict I/Os, daily weights  Essential hypertension, stable:  BPs soft in the ED 80-100s/40s-50s.  Hypotension is also noted on her problem list and review of old clinic notes shows that she is at baseline BP.  DM type 2 with diabetic peripheral neuropathy: diet controlled with A1c of 5.0 (04/2015). Unclear source of intermittent hypoglycemia episodes, but patient is now on full diet.  - Lyrica  PAF:  On amiodarone as an outpatient.  No A/C due to bleeding hx.  In NSR currently.  No afib on telemetry. - Amiodarone  Hypertriglyceridemia:  Hold fenofibrate in the setting of abdominal pain, diarrhea. GERD:  Continue PPI. CKD3: stable at baseline Hypothyroidism:  Continue Synthroid. Scleroderma:  stable  Diet:  Clears VTE ppx:  SCDs; no pharmacologic VTE in the setting of GI bleed Code status:  Full  Dispo: Disposition is deferred at this time, awaiting improvement of current medical problems.  Anticipated discharge in approximately 1-2 day(s).   The patient does have a current PCP Sid Falcon, MD) and does need an [facility] hospital follow-up appointment after discharge.  The patient does not know have transportation limitations that hinder transportation to clinic appointments.  .Services Needed at time of discharge: Y = Yes, Blank = No PT:   OT:   RN:   Equipment:   Other:     LOS: 3 days   Iline Oven, MD 05/15/2015, 9:27 AM

## 2015-05-15 NOTE — Care Management Important Message (Signed)
Important Message  Patient Details  Name: [name] MRN: [id number] Date of Birth: [birth year]   Medicare Important Message Given:  Yes    Nathen May 05/15/2015, 3:02 PM

## 2015-05-16 LAB — TYPE AND SCREEN
ABO/RH(D): O NEG
ANTIBODY SCREEN: POSITIVE
DAT, IGG: NEGATIVE
DONOR AG TYPE: NEGATIVE
DONOR AG TYPE: NEGATIVE
DONOR AG TYPE: NEGATIVE
DONOR AG TYPE: NEGATIVE
DONOR AG TYPE: NEGATIVE
Donor AG Type: NEGATIVE
UNIT DIVISION: 0
UNIT DIVISION: 0
Unit division: 0
Unit division: 0
Unit division: 0
Unit division: 0

## 2015-05-19 DIAGNOSIS — I110 Hypertensive heart disease with heart failure: Secondary | ICD-10-CM | POA: Diagnosis not present

## 2015-05-19 DIAGNOSIS — M3489 Other systemic sclerosis: Secondary | ICD-10-CM | POA: Diagnosis not present

## 2015-05-19 DIAGNOSIS — I480 Paroxysmal atrial fibrillation: Secondary | ICD-10-CM | POA: Diagnosis not present

## 2015-05-19 DIAGNOSIS — I509 Heart failure, unspecified: Secondary | ICD-10-CM | POA: Diagnosis not present

## 2015-05-19 DIAGNOSIS — D62 Acute posthemorrhagic anemia: Secondary | ICD-10-CM | POA: Diagnosis not present

## 2015-05-19 DIAGNOSIS — E119 Type 2 diabetes mellitus without complications: Secondary | ICD-10-CM | POA: Diagnosis not present

## 2015-05-19 DIAGNOSIS — E039 Hypothyroidism, unspecified: Secondary | ICD-10-CM | POA: Diagnosis not present

## 2015-05-19 DIAGNOSIS — K922 Gastrointestinal hemorrhage, unspecified: Secondary | ICD-10-CM | POA: Diagnosis not present

## 2015-05-19 DIAGNOSIS — I272 Other secondary pulmonary hypertension: Secondary | ICD-10-CM | POA: Diagnosis not present

## 2015-05-20 ENCOUNTER — Ambulatory Visit: Payer: Medicare Other | Admitting: Internal Medicine

## 2015-05-26 DIAGNOSIS — I110 Hypertensive heart disease with heart failure: Secondary | ICD-10-CM | POA: Diagnosis not present

## 2015-05-26 DIAGNOSIS — M3489 Other systemic sclerosis: Secondary | ICD-10-CM | POA: Diagnosis not present

## 2015-05-26 DIAGNOSIS — E039 Hypothyroidism, unspecified: Secondary | ICD-10-CM | POA: Diagnosis not present

## 2015-05-26 DIAGNOSIS — D62 Acute posthemorrhagic anemia: Secondary | ICD-10-CM | POA: Diagnosis not present

## 2015-05-26 DIAGNOSIS — E119 Type 2 diabetes mellitus without complications: Secondary | ICD-10-CM | POA: Diagnosis not present

## 2015-05-26 DIAGNOSIS — K922 Gastrointestinal hemorrhage, unspecified: Secondary | ICD-10-CM | POA: Diagnosis not present

## 2015-05-26 DIAGNOSIS — I272 Other secondary pulmonary hypertension: Secondary | ICD-10-CM | POA: Diagnosis not present

## 2015-05-26 DIAGNOSIS — I509 Heart failure, unspecified: Secondary | ICD-10-CM | POA: Diagnosis not present

## 2015-05-26 DIAGNOSIS — I480 Paroxysmal atrial fibrillation: Secondary | ICD-10-CM | POA: Diagnosis not present

## 2015-05-28 DIAGNOSIS — K922 Gastrointestinal hemorrhage, unspecified: Secondary | ICD-10-CM | POA: Diagnosis not present

## 2015-05-28 DIAGNOSIS — I272 Other secondary pulmonary hypertension: Secondary | ICD-10-CM | POA: Diagnosis not present

## 2015-05-28 DIAGNOSIS — I480 Paroxysmal atrial fibrillation: Secondary | ICD-10-CM | POA: Diagnosis not present

## 2015-05-28 DIAGNOSIS — E119 Type 2 diabetes mellitus without complications: Secondary | ICD-10-CM | POA: Diagnosis not present

## 2015-05-28 DIAGNOSIS — I509 Heart failure, unspecified: Secondary | ICD-10-CM | POA: Diagnosis not present

## 2015-05-28 DIAGNOSIS — M3489 Other systemic sclerosis: Secondary | ICD-10-CM | POA: Diagnosis not present

## 2015-05-28 DIAGNOSIS — I110 Hypertensive heart disease with heart failure: Secondary | ICD-10-CM | POA: Diagnosis not present

## 2015-05-28 DIAGNOSIS — D62 Acute posthemorrhagic anemia: Secondary | ICD-10-CM | POA: Diagnosis not present

## 2015-05-28 DIAGNOSIS — E039 Hypothyroidism, unspecified: Secondary | ICD-10-CM | POA: Diagnosis not present

## 2015-05-29 ENCOUNTER — Ambulatory Visit: Payer: Medicare Other | Admitting: Internal Medicine

## 2015-05-30 DIAGNOSIS — I272 Other secondary pulmonary hypertension: Secondary | ICD-10-CM | POA: Diagnosis not present

## 2015-05-30 DIAGNOSIS — D62 Acute posthemorrhagic anemia: Secondary | ICD-10-CM | POA: Diagnosis not present

## 2015-05-30 DIAGNOSIS — M3489 Other systemic sclerosis: Secondary | ICD-10-CM | POA: Diagnosis not present

## 2015-05-30 DIAGNOSIS — I110 Hypertensive heart disease with heart failure: Secondary | ICD-10-CM | POA: Diagnosis not present

## 2015-05-30 DIAGNOSIS — E039 Hypothyroidism, unspecified: Secondary | ICD-10-CM | POA: Diagnosis not present

## 2015-05-30 DIAGNOSIS — E119 Type 2 diabetes mellitus without complications: Secondary | ICD-10-CM | POA: Diagnosis not present

## 2015-05-30 DIAGNOSIS — K922 Gastrointestinal hemorrhage, unspecified: Secondary | ICD-10-CM | POA: Diagnosis not present

## 2015-05-30 DIAGNOSIS — I480 Paroxysmal atrial fibrillation: Secondary | ICD-10-CM | POA: Diagnosis not present

## 2015-05-30 DIAGNOSIS — I509 Heart failure, unspecified: Secondary | ICD-10-CM | POA: Diagnosis not present

## 2015-06-03 ENCOUNTER — Ambulatory Visit (INDEPENDENT_AMBULATORY_CARE_PROVIDER_SITE_OTHER): Payer: Medicare Other | Admitting: Internal Medicine

## 2015-06-03 ENCOUNTER — Telehealth: Payer: Self-pay | Admitting: *Deleted

## 2015-06-03 ENCOUNTER — Other Ambulatory Visit: Payer: Self-pay | Admitting: *Deleted

## 2015-06-03 VITALS — BP 96/46 | HR 64 | Temp 98.0°F | Ht 64.0 in | Wt 147.2 lb

## 2015-06-03 DIAGNOSIS — K5521 Angiodysplasia of colon with hemorrhage: Secondary | ICD-10-CM | POA: Diagnosis not present

## 2015-06-03 LAB — CBC
HEMATOCRIT: 23.3 % — AB (ref 36.0–46.0)
HEMOGLOBIN: 7.5 g/dL — AB (ref 12.0–15.0)
MCH: 28.3 pg (ref 26.0–34.0)
MCHC: 32.2 g/dL (ref 30.0–36.0)
MCV: 87.9 fL (ref 78.0–100.0)
Platelets: 206 10*3/uL (ref 150–400)
RBC: 2.65 MIL/uL — ABNORMAL LOW (ref 3.87–5.11)
RDW: 19.1 % — ABNORMAL HIGH (ref 11.5–15.5)
WBC: 7.2 10*3/uL (ref 4.0–10.5)

## 2015-06-03 LAB — PREPARE RBC (CROSSMATCH)

## 2015-06-03 NOTE — Addendum Note (Signed)
Addended by: Viviano Simas A on: 06/03/2015 11:41 AM   Modules accepted: Orders

## 2015-06-03 NOTE — Patient Instructions (Signed)
You have Gastrointestinal Arteriovenous Malformations (GI AVMs).  These will spontaneously bleed from time to time and is something we just have to monitor.  When you have a bright red, bloody BM or dark, black, tarry BMs, call the clinic in order to have your blood checked for possible infusion.

## 2015-06-03 NOTE — Assessment & Plan Note (Addendum)
Patient reports she is doing a little better since her discharge from the hospital.  However, she remains tired and weaker than normal.  She noticed repeat BRBPR on 3/15, and her BMs have since turned dark, black, and tarry.  She has noticed gassy abdominal pain. She becomes SOB with exertion, but not when performing her ADLs.  She has intermittent sharp chest pains not a/w exertion, but only at rest.  She denies dizziness or lightheadedness, but knows to rise slowly.  A/P: Continued GI AVMs with hemorrhage.  Last CBC was 3/2, while inpatient.  GI does not want to intervene further and plan is for active surveillance.  CBC demonstrates Hgb 7.5, but patient had recent/active bleeding.  Will schedule for transfusion of 2 U pRBCs. - Type and Cross - Transfuse 2 U pRBC

## 2015-06-03 NOTE — Progress Notes (Signed)
Internal Medicine Clinic Attending  Case discussed with Dr. Lovena Le at the time of the visit.  We reviewed the resident's history and exam and pertinent patient test results.  I agree with the assessment, diagnosis, and plan of care documented in the resident's note.

## 2015-06-03 NOTE — Telephone Encounter (Signed)
SPOKE WITH PATIENT TO LET HER KNOW HER HEMOGLOBIN IS 7.5 AND THAT SHE HAS BEEN SCHEDULED FOR A BLOOD TRANSFUSION . HER APPOINTMENT IS FOR Thursday AT 8:00AM / TO GO TO ADMISSION OFFICE ON THE FIRST FLOOR.  PATIENT IS TO CALL [facility] ([phone]) IF SHE IS UNABLE TO KEEP THIS APPOINTMENT. THIS WILL BE DONE IN SHORT STAY. THIS APPOINTMENT WAS SET UP WITH [facility]. ORDERS FAXED TO [phone].

## 2015-06-03 NOTE — Progress Notes (Signed)
Patient ID: [name], female   DOB: Mar 11, 1951, 65 y.o.   MRN: [id number]   Subjective:   Patient ID: [name] female   DOB: 11/08/1950 65 y.o.   MRN: [id number]  HPI: [name] is a 65 y.o. female with PMH as below, here for f/u GI AVMs and bloody BM.  Please see Problem-Based charting for the status of the patient's chronic medical issues.  Patient denies dizziness or lightheadedness, but knows to rise slowly.  She has intermittent sharp chest pains at rest, but not with exertion.  She has SOB with exertion, but not when performing her ADLs.   Past Medical History  Diagnosis Date  . Secondary pulmonary hypertension ([place])     a. 2/2 scleroderma  . GERD (gastroesophageal reflux disease)   . Systemic sclerosis ([place])   . Unspecified essential hypertension   . Gastroparesis   . Sickle cell trait ([place])   . Obesity   . Scleroderma ([place])   . Trichomonas   . Peripheral neuropathy ([place])   . Type II diabetes mellitus ([facility])     a. diet control   . Arthritis   . Chronic kidney disease   . Gout   . Hypothyroidism     a. may be due to amiodarone use. started on synthroid on 12/2014 admission   . Anemia     a.  hx of GI bleed and duodenal AVMs  . Chronic diastolic CHF (congestive heart failure) ([place])     a. Echo 12/23/14 withEF 60-65%, moderately dilated RV, PASP 72, no pericardial effusion.  Marland Kitchen PAF (paroxysmal atrial fibrillation) ([facility])     a. not a long term AC candidate due to GI bleeds  . Angiodysplasia of colon with hemorrhage    Current Outpatient Prescriptions  Medication Sig Dispense Refill  . ADCIRCA 20 MG TABS TAKE 2 TABLETS (40MG) BY MOUTH ONCE DAILY WITH OR WITHOUT FOOD. CALL [phone] FOR REFILLS. 60 tablet 11  . ambrisentan (LETAIRIS) 10 MG tablet Take 1 tablet (10 mg total) by mouth daily. 30 tablet 11  . amiodarone (PACERONE) 200 MG tablet Take 1 tablet (200 mg total) by mouth daily. 30 tablet 6  . esomeprazole (NEXIUM) 40 MG capsule Take 40 mg by  mouth 2 (two) times daily.     . fenofibrate 54 MG tablet Take 1 tablet (54 mg total) by mouth daily. 30 tablet 12  . FLUoxetine (PROZAC) 20 MG capsule take 1 capsule by mouth every morning 30 capsule 2  . levothyroxine (SYNTHROID, LEVOTHROID) 25 MCG tablet Take 1 tablet (25 mcg total) by mouth daily before breakfast. 30 tablet 11  . metolazone (ZAROXOLYN) 2.5 MG tablet Take 1 tablet (2.5 mg total) by mouth 2 (two) times a week. (Tuesdays and Fridays) 20 tablet 3  . nortriptyline (PAMELOR) 10 MG capsule Take 2 capsules (20 mg total) by mouth at bedtime. 60 capsule 8  . potassium chloride SA (K-DUR,KLOR-CON) 10 MEQ tablet Take 2 tablets (20 mEq total) by mouth daily. Take additional 20 meQ with metolazone doses 120 tablet 2  . pregabalin (LYRICA) 150 MG capsule Take 1 capsule (150 mg total) by mouth 3 (three) times daily. 90 capsule 5  . promethazine (PHENERGAN) 12.5 MG tablet Take 12.5 mg by mouth every 6 (six) hours as needed for nausea or vomiting.    . Selexipag (UPTRAVI) 1600 MCG TABS Take 1,600 mcg by mouth 2 (two) times daily. 60 tablet 3  . torsemide (DEMADEX) 20 MG tablet Take 60 mg  by mouth 2 (two) times daily.     No current facility-administered medications for this visit.   Family History  Problem Relation Age of Onset  . Heart disease Mother   . Diabetes Mother   . Diabetes Sister    Social History   Social History  . Marital Status: Legally Separated    Spouse Name: N/A  . Number of Children: 2  . Years of Education: 11   Occupational History  . Umemployed   . Disability     Social History Main Topics  . Smoking status: Never Smoker   . Smokeless tobacco: Never Used  . Alcohol Use: No  . Drug Use: No  . Sexual Activity: No   Other Topics Concern  . Not on file   Social History Narrative    FAMILY HISTORY:  Significant for coronary artery disease and diabetes   Patient lives at home with granddaughter.    Patient has 2 children.    Patient has 11 years of  education.    Patient is on disability.    Patient is right handed.    Patient is separated.     Review of Systems: Pertinent items are noted in HPI. Objective:  Physical Exam: Filed Vitals:   06/03/15 0924  BP: 96/46  Pulse: 64  Temp: 98 F (36.7 C)  TempSrc: Oral  Height: _0  (1.626 m)  Weight: 147 lb 3.2 oz (66.769 kg)  SpO2: 96%   Physical Exam  Constitutional: She is oriented to person, place, and time.  WD, WN, appears older than stated age. NAD  HENT:  Head: Normocephalic and atraumatic.  Eyes: Conjunctivae and EOM are normal. No scleral icterus.  Neck: No tracheal deviation present.  Cardiovascular: Normal rate, regular rhythm, normal heart sounds and intact distal pulses.   Pulmonary/Chest: Effort normal and breath sounds normal. No stridor. No respiratory distress. She has no wheezes.  Abdominal: Soft. She exhibits no distension. There is no rebound and no guarding.  Minimally tender to palpation diffusely  Neurological: She is alert and oriented to person, place, and time.  Skin: Skin is warm and dry.     Assessment & Plan:   Patient and case were discussed with Dr. Lynnae January.  Please refer to Problem Based charting for further documentation.

## 2015-06-04 DIAGNOSIS — E039 Hypothyroidism, unspecified: Secondary | ICD-10-CM | POA: Diagnosis not present

## 2015-06-04 DIAGNOSIS — M3489 Other systemic sclerosis: Secondary | ICD-10-CM | POA: Diagnosis not present

## 2015-06-04 DIAGNOSIS — I272 Other secondary pulmonary hypertension: Secondary | ICD-10-CM | POA: Diagnosis not present

## 2015-06-04 DIAGNOSIS — E119 Type 2 diabetes mellitus without complications: Secondary | ICD-10-CM | POA: Diagnosis not present

## 2015-06-04 DIAGNOSIS — I480 Paroxysmal atrial fibrillation: Secondary | ICD-10-CM | POA: Diagnosis not present

## 2015-06-04 DIAGNOSIS — D62 Acute posthemorrhagic anemia: Secondary | ICD-10-CM | POA: Diagnosis not present

## 2015-06-04 DIAGNOSIS — K922 Gastrointestinal hemorrhage, unspecified: Secondary | ICD-10-CM | POA: Diagnosis not present

## 2015-06-04 DIAGNOSIS — I509 Heart failure, unspecified: Secondary | ICD-10-CM | POA: Diagnosis not present

## 2015-06-04 DIAGNOSIS — I110 Hypertensive heart disease with heart failure: Secondary | ICD-10-CM | POA: Diagnosis not present

## 2015-06-05 ENCOUNTER — Ambulatory Visit (HOSPITAL_COMMUNITY)
Admission: RE | Admit: 2015-06-05 | Discharge: 2015-06-05 | Disposition: A | Discharge status: Home / Self Care | Payer: Medicare Other | Source: Ambulatory Visit | Attending: Oncology | Admitting: Oncology

## 2015-06-05 VITALS — BP 92/45 | HR 68 | Temp 98.2°F | Resp 20 | Ht 64.0 in | Wt 137.0 lb

## 2015-06-05 DIAGNOSIS — K5521 Angiodysplasia of colon with hemorrhage: Secondary | ICD-10-CM | POA: Diagnosis not present

## 2015-06-05 LAB — HEMOGLOBIN AND HEMATOCRIT, BLOOD
HEMATOCRIT: 28 % — AB (ref 36.0–46.0)
HEMOGLOBIN: 8.8 g/dL — AB (ref 12.0–15.0)

## 2015-06-05 MED ORDER — SODIUM CHLORIDE 0.9 % IV SOLN: Freq: Once | INTRAVENOUS | Status: DC

## 2015-06-06 DIAGNOSIS — I110 Hypertensive heart disease with heart failure: Secondary | ICD-10-CM | POA: Diagnosis not present

## 2015-06-06 DIAGNOSIS — I509 Heart failure, unspecified: Secondary | ICD-10-CM | POA: Diagnosis not present

## 2015-06-06 DIAGNOSIS — M3489 Other systemic sclerosis: Secondary | ICD-10-CM | POA: Diagnosis not present

## 2015-06-06 DIAGNOSIS — D62 Acute posthemorrhagic anemia: Secondary | ICD-10-CM | POA: Diagnosis not present

## 2015-06-06 DIAGNOSIS — E119 Type 2 diabetes mellitus without complications: Secondary | ICD-10-CM | POA: Diagnosis not present

## 2015-06-06 DIAGNOSIS — K922 Gastrointestinal hemorrhage, unspecified: Secondary | ICD-10-CM | POA: Diagnosis not present

## 2015-06-06 DIAGNOSIS — E039 Hypothyroidism, unspecified: Secondary | ICD-10-CM | POA: Diagnosis not present

## 2015-06-06 DIAGNOSIS — I272 Other secondary pulmonary hypertension: Secondary | ICD-10-CM | POA: Diagnosis not present

## 2015-06-06 DIAGNOSIS — I480 Paroxysmal atrial fibrillation: Secondary | ICD-10-CM | POA: Diagnosis not present

## 2015-06-06 LAB — TYPE AND SCREEN
ABO/RH(D): O NEG
Antibody Screen: POSITIVE
DAT, IgG: NEGATIVE
DONOR AG TYPE: NEGATIVE
DONOR AG TYPE: NEGATIVE
UNIT DIVISION: 0
UNIT DIVISION: 0

## 2015-06-16 ENCOUNTER — Other Ambulatory Visit: Payer: Self-pay

## 2015-06-18 MED ORDER — FLUOXETINE HCL 20 MG PO CAPS: 20.0000 mg | ORAL_CAPSULE | Freq: Every morning | ORAL | Status: DC

## 2015-06-19 ENCOUNTER — Encounter (HOSPITAL_COMMUNITY): Payer: Self-pay | Admitting: Emergency Medicine

## 2015-06-19 ENCOUNTER — Encounter (HOSPITAL_COMMUNITY): Payer: Medicare Other

## 2015-06-19 ENCOUNTER — Telehealth (HOSPITAL_COMMUNITY): Payer: Self-pay | Admitting: *Deleted

## 2015-06-19 ENCOUNTER — Inpatient Hospital Stay (HOSPITAL_COMMUNITY)
Admission: EM | Admit: 2015-06-19 | Discharge: 2015-06-23 | DRG: 345 | Disposition: A | Discharge status: Home Health | Payer: Medicare Other | Attending: Internal Medicine | Admitting: Internal Medicine

## 2015-06-19 ENCOUNTER — Inpatient Hospital Stay (HOSPITAL_COMMUNITY): Payer: Medicare Other

## 2015-06-19 ENCOUNTER — Telehealth: Payer: Self-pay

## 2015-06-19 DIAGNOSIS — E861 Hypovolemia: Secondary | ICD-10-CM | POA: Diagnosis not present

## 2015-06-19 DIAGNOSIS — Z833 Family history of diabetes mellitus: Secondary | ICD-10-CM

## 2015-06-19 DIAGNOSIS — I5032 Chronic diastolic (congestive) heart failure: Secondary | ICD-10-CM | POA: Diagnosis not present

## 2015-06-19 DIAGNOSIS — M1612 Unilateral primary osteoarthritis, left hip: Secondary | ICD-10-CM | POA: Diagnosis present

## 2015-06-19 DIAGNOSIS — E1143 Type 2 diabetes mellitus with diabetic autonomic (poly)neuropathy: Secondary | ICD-10-CM | POA: Diagnosis not present

## 2015-06-19 DIAGNOSIS — E039 Hypothyroidism, unspecified: Secondary | ICD-10-CM | POA: Diagnosis present

## 2015-06-19 DIAGNOSIS — N183 Chronic kidney disease, stage 3 (moderate): Secondary | ICD-10-CM | POA: Diagnosis present

## 2015-06-19 DIAGNOSIS — M349 Systemic sclerosis, unspecified: Secondary | ICD-10-CM | POA: Diagnosis present

## 2015-06-19 DIAGNOSIS — N179 Acute kidney failure, unspecified: Secondary | ICD-10-CM | POA: Diagnosis present

## 2015-06-19 DIAGNOSIS — I440 Atrioventricular block, first degree: Secondary | ICD-10-CM | POA: Diagnosis not present

## 2015-06-19 DIAGNOSIS — D62 Acute posthemorrhagic anemia: Secondary | ICD-10-CM | POA: Diagnosis not present

## 2015-06-19 DIAGNOSIS — K5521 Angiodysplasia of colon with hemorrhage: Secondary | ICD-10-CM | POA: Insufficient documentation

## 2015-06-19 DIAGNOSIS — I272 Other secondary pulmonary hypertension: Secondary | ICD-10-CM | POA: Diagnosis present

## 2015-06-19 DIAGNOSIS — Q2730 Arteriovenous malformation, site unspecified: Secondary | ICD-10-CM | POA: Diagnosis not present

## 2015-06-19 DIAGNOSIS — Q2733 Arteriovenous malformation of digestive system vessel: Secondary | ICD-10-CM | POA: Diagnosis not present

## 2015-06-19 DIAGNOSIS — D649 Anemia, unspecified: Secondary | ICD-10-CM | POA: Diagnosis not present

## 2015-06-19 DIAGNOSIS — K3184 Gastroparesis: Secondary | ICD-10-CM | POA: Diagnosis present

## 2015-06-19 DIAGNOSIS — I130 Hypertensive heart and chronic kidney disease with heart failure and stage 1 through stage 4 chronic kidney disease, or unspecified chronic kidney disease: Secondary | ICD-10-CM | POA: Diagnosis present

## 2015-06-19 DIAGNOSIS — L89312 Pressure ulcer of right buttock, stage 2: Secondary | ICD-10-CM | POA: Diagnosis present

## 2015-06-19 DIAGNOSIS — Z8249 Family history of ischemic heart disease and other diseases of the circulatory system: Secondary | ICD-10-CM | POA: Diagnosis not present

## 2015-06-19 DIAGNOSIS — I9589 Other hypotension: Secondary | ICD-10-CM | POA: Diagnosis not present

## 2015-06-19 DIAGNOSIS — K921 Melena: Secondary | ICD-10-CM | POA: Diagnosis not present

## 2015-06-19 DIAGNOSIS — K625 Hemorrhage of anus and rectum: Secondary | ICD-10-CM | POA: Diagnosis not present

## 2015-06-19 DIAGNOSIS — K219 Gastro-esophageal reflux disease without esophagitis: Secondary | ICD-10-CM | POA: Diagnosis present

## 2015-06-19 DIAGNOSIS — E1122 Type 2 diabetes mellitus with diabetic chronic kidney disease: Secondary | ICD-10-CM | POA: Diagnosis present

## 2015-06-19 DIAGNOSIS — K922 Gastrointestinal hemorrhage, unspecified: Secondary | ICD-10-CM | POA: Diagnosis present

## 2015-06-19 DIAGNOSIS — K558 Other vascular disorders of intestine: Secondary | ICD-10-CM | POA: Diagnosis not present

## 2015-06-19 DIAGNOSIS — K31811 Angiodysplasia of stomach and duodenum with bleeding: Secondary | ICD-10-CM | POA: Insufficient documentation

## 2015-06-19 DIAGNOSIS — D573 Sickle-cell trait: Secondary | ICD-10-CM | POA: Diagnosis present

## 2015-06-19 DIAGNOSIS — I480 Paroxysmal atrial fibrillation: Secondary | ICD-10-CM | POA: Diagnosis present

## 2015-06-19 DIAGNOSIS — M25552 Pain in left hip: Secondary | ICD-10-CM

## 2015-06-19 DIAGNOSIS — L8990 Pressure ulcer of unspecified site, unspecified stage: Secondary | ICD-10-CM | POA: Insufficient documentation

## 2015-06-19 DIAGNOSIS — R001 Bradycardia, unspecified: Secondary | ICD-10-CM | POA: Diagnosis not present

## 2015-06-19 LAB — COMPREHENSIVE METABOLIC PANEL
ALBUMIN: 2.4 g/dL — AB (ref 3.5–5.0)
ALK PHOS: 84 U/L (ref 38–126)
ALT: 7 U/L — AB (ref 14–54)
AST: 14 U/L — ABNORMAL LOW (ref 15–41)
Anion gap: 12 (ref 5–15)
BUN: 82 mg/dL — AB (ref 6–20)
CALCIUM: 8.3 mg/dL — AB (ref 8.9–10.3)
CO2: 24 mmol/L (ref 22–32)
CREATININE: 1.84 mg/dL — AB (ref 0.44–1.00)
Chloride: 106 mmol/L (ref 101–111)
GFR calc non Af Amer: 28 mL/min — ABNORMAL LOW (ref >60)
GFR, EST AFRICAN AMERICAN: 33 mL/min — AB (ref >60)
GLUCOSE: 123 mg/dL — AB (ref 65–99)
Potassium: 4 mmol/L (ref 3.5–5.1)
SODIUM: 142 mmol/L (ref 135–145)
Total Bilirubin: 0.4 mg/dL (ref 0.3–1.2)
Total Protein: 6.1 g/dL — ABNORMAL LOW (ref 6.5–8.1)

## 2015-06-19 LAB — CBC
HCT: 19.4 % — ABNORMAL LOW (ref 36.0–46.0)
Hemoglobin: 6.2 g/dL — CL (ref 12.0–15.0)
MCH: 26.8 pg (ref 26.0–34.0)
MCHC: 32 g/dL (ref 30.0–36.0)
MCV: 84 fL (ref 78.0–100.0)
PLATELETS: 216 10*3/uL (ref 150–400)
RBC: 2.31 MIL/uL — ABNORMAL LOW (ref 3.87–5.11)
RDW: 16.4 % — AB (ref 11.5–15.5)
WBC: 9.5 10*3/uL (ref 4.0–10.5)

## 2015-06-19 LAB — PROTIME-INR
INR: 1.47 (ref 0.00–1.49)
PROTHROMBIN TIME: 17.9 s — AB (ref 11.6–15.2)

## 2015-06-19 LAB — PREPARE RBC (CROSSMATCH)

## 2015-06-19 LAB — POC OCCULT BLOOD, ED: FECAL OCCULT BLD: POSITIVE — AB

## 2015-06-19 MED ORDER — SODIUM CHLORIDE 0.9 % IV SOLN
INTRAVENOUS | Status: AC
  Administered 2015-06-19 – 2015-06-20 (×2): via INTRAVENOUS

## 2015-06-19 MED ORDER — SODIUM CHLORIDE 0.9% FLUSH
3.0000 mL | Freq: Two times a day (BID) | INTRAVENOUS | Status: DC
  Administered 2015-06-19 – 2015-06-22 (×6): 3 mL via INTRAVENOUS

## 2015-06-19 MED ORDER — SODIUM CHLORIDE 0.9 % IV SOLN
10.0000 mL/h | Freq: Once | INTRAVENOUS | Status: AC
  Administered 2015-06-19: 10 mL/h via INTRAVENOUS

## 2015-06-19 MED ORDER — SODIUM CHLORIDE 0.9 % IV BOLUS (SEPSIS): 1000.0000 mL | Freq: Once | INTRAVENOUS | Status: DC

## 2015-06-19 MED ORDER — PANTOPRAZOLE SODIUM 40 MG IV SOLR
80.0000 mg | Freq: Once | INTRAVENOUS | Status: AC
  Administered 2015-06-19: 80 mg via INTRAVENOUS
  Filled 2015-06-19: qty 80

## 2015-06-19 NOTE — ED Notes (Signed)
Pt reports a large amount of rectal bleeding starting last night. Pt alert x4. Pt hypotensive in triage.

## 2015-06-19 NOTE — Telephone Encounter (Signed)
Pt left voice message to cancel her appointment this morning.

## 2015-06-19 NOTE — H&P (Signed)
Date: 06/19/2015               Patient Name:  [name] MRN: [id number]  DOB: 08-01-1950 Age / Sex: 65 y.o., female   PCP: Sid Falcon, MD              Medical Service: Internal Medicine Teaching Service              Attending Physician: Dr. Bartholomew Crews, MD    First Contact: Wille Celeste, MS4 Pager: [phone]  Second Contact: Dr. Brent Bulla Pager: [phone]  Third Contact Dr. Lynnae January Pager:        After Hours (After 5p/  First Contact Pager: [phone]  weekends / holidays): Second Contact Pager: [phone]   Chief Complaint: bloody bowel movement  History of Present Illness: [name] is a 65 y.o. Woman with a history of scleroderma/systemic sclerosis, severe pulmonary hypertension, diastolic heart failure, diet controlled diabetes, hypothyroidism, and angiodysplasia who presented to the emergency department with 1 episode of dark red rectal bleeding with clots.  Denies further bleeding since being transported to the ED.  History was difficult to extract due to the patient's drowsiness. However, she did confirm that this episode is similar to prior episodes of rectal bleeding and bloody bowel movements she has had previously.  She also complains of fatigue.  She has not been able to get up and walk around that much anyway because she has had worsening left hip pain that started on Monday, which has led her to be confined to the bed.  The pain is worse with any kind of movement. She denies any falls or trauma. She usually uses a heating pad to control the pain, but sometimes she has to use her hydrocodone to fully treat it.  She was unable to describe the exact character of the pain because she was so tired.  She denies fevers, chest pain, shortness of breath, numbness, changes in bowel habits or any other bleeding such as hematuria, hematemesis, hemoptysis. She endorses dry mouth.    In the ED she had a Hb of 6.2 and blood pressures in the 80's/40's.  She was accompanied by her sisters  who confirmed that her bleeding was dark red and contained clots.    Meds: Current Facility-Administered Medications  Medication Dose Route Frequency Provider Last Rate Last Dose  . 0.9 %  sodium chloride infusion  10 mL/hr Intravenous Once Lajean Saver, MD      . pantoprazole (PROTONIX) 80 mg in sodium chloride 0.9 % 100 mL IVPB  80 mg Intravenous Once Lajean Saver, MD      . sodium chloride 0.9 % bolus 1,000 mL  1,000 mL Intravenous Once Juliet Rude, MD       Current Outpatient Prescriptions  Medication Sig Dispense Refill  . ADCIRCA 20 MG TABS TAKE 2 TABLETS (40MG) BY MOUTH ONCE DAILY WITH OR WITHOUT FOOD. CALL [phone] FOR REFILLS. 60 tablet 11  . ambrisentan (LETAIRIS) 10 MG tablet Take 1 tablet (10 mg total) by mouth daily. 30 tablet 11  . amiodarone (PACERONE) 200 MG tablet Take 1 tablet (200 mg total) by mouth daily. 30 tablet 6  . esomeprazole (NEXIUM) 40 MG capsule Take 40 mg by mouth 2 (two) times daily.     . fenofibrate 54 MG tablet Take 1 tablet (54 mg total) by mouth daily. 30 tablet 12  . FLUoxetine (PROZAC) 20 MG capsule Take 1 capsule (20 mg total) by mouth every morning. 30 capsule 5  .  levothyroxine (SYNTHROID, LEVOTHROID) 25 MCG tablet Take 1 tablet (25 mcg total) by mouth daily before breakfast. 30 tablet 11  . metolazone (ZAROXOLYN) 2.5 MG tablet Take 1 tablet (2.5 mg total) by mouth 2 (two) times a week. (Tuesdays and Fridays) 20 tablet 3  . nortriptyline (PAMELOR) 10 MG capsule Take 2 capsules (20 mg total) by mouth at bedtime. 60 capsule 8  . potassium chloride SA (K-DUR,KLOR-CON) 10 MEQ tablet Take 2 tablets (20 mEq total) by mouth daily. Take additional 20 meQ with metolazone doses 120 tablet 2  . pregabalin (LYRICA) 150 MG capsule Take 1 capsule (150 mg total) by mouth 3 (three) times daily. 90 capsule 5  . promethazine (PHENERGAN) 12.5 MG tablet Take 12.5 mg by mouth every 6 (six) hours as needed for nausea or vomiting.    . Selexipag (UPTRAVI) 1600 MCG  TABS Take 1,600 mcg by mouth 2 (two) times daily. 60 tablet 3  . torsemide (DEMADEX) 20 MG tablet Take 60 mg by mouth 2 (two) times daily.      Allergies: Allergies as of 06/19/2015 - Review Complete 06/19/2015  Allergen Reaction Noted  . Cephalexin Rash 05/01/2010  . Ciprofloxacin Rash 07/23/2010  . Codeine Other (See Comments)   . Contrast media [iodinated diagnostic agents] Hives 08/03/2011  . Iohexol Hives 03/04/2008   Past Medical History  Diagnosis Date  . Secondary pulmonary hypertension ([place])     a. 2/2 scleroderma  . GERD (gastroesophageal reflux disease)   . Systemic sclerosis ([place])   . Unspecified essential hypertension   . Gastroparesis   . Sickle cell trait ([place])   . Obesity   . Scleroderma ([place])   . Trichomonas   . Peripheral neuropathy ([place])   . Type II diabetes mellitus ([facility])     a. diet control   . Arthritis   . Chronic kidney disease   . Gout   . Hypothyroidism     a. may be due to amiodarone use. started on synthroid on 12/2014 admission   . Anemia     a.  hx of GI bleed and duodenal AVMs  . Chronic diastolic CHF (congestive heart failure) ([place])     a. Echo 12/23/14 withEF 60-65%, moderately dilated RV, PASP 72, no pericardial effusion.  Marland Kitchen PAF (paroxysmal atrial fibrillation) ([facility])     a. not a long term AC candidate due to GI bleeds  . Angiodysplasia of colon with hemorrhage    Past Surgical History  Procedure Laterality Date  . Tubal ligation    . Esophagogastroduodenoscopy  02/23/2010    D 2 AVM, ablated with APC.   Marland Kitchen Shoulder arthroscopy Right 12/2009    subacromial decompression  . Replacement total knee Left 11/2000  . Cardiac catheterization Right 04/24/2004  . Tonsillectomy  1960's  . Abdominal hysterectomy  1983    "partial"  . Shoulder arthroscopy w/ rotator cuff repair Right 01/2010  . Excisional total knee arthroplasty with antibiotic spacers Left 08/2010    "got infected & had to take 1st replacement out"  . Revision total knee  arthroplasty Left 11/2010    "removed spacers; replaced knee"  . Total knee revision with scar debridement/patella revision with poly exchange Left 12/2010    fell; knee split opened; had to redo revision"  . Cardiac catheterization Left 07/2010  . Peripherally inserted central catheter insertion  09/2010  . Colonoscopy  09/06/2008, 09/2013    int rrhoids:Dr. Lajoyce Corners 2010. Pan-colonic AVMs, microscopic colitis per Dr Deatra Ina 2015  . Right heart  catheterization N/A 03/18/2014    Procedure: RIGHT HEART CATH;  Surgeon: Larey Dresser, MD;  Location: [facility] CATH LAB;  Service: Cardiovascular;  Laterality: N/A;  . Esophagogastroduodenoscopy N/A 03/23/2014    Procedure: ESOPHAGOGASTRODUODENOSCOPY (EGD);  Surgeon: Ladene Artist, MD;  Location: [facility] ENDOSCOPY;  Service: Endoscopy;  Laterality: N/A;  . Pericardial tap N/A 05/03/2014    Procedure: PERICARDIAL TAP;  Surgeon: Sinclair Grooms, MD;  Location: [facility] CATH LAB;  Service: Cardiovascular;  Laterality: N/A;  . Esophagogastroduodenoscopy N/A 02/21/2015    Procedure: ESOPHAGOGASTRODUODENOSCOPY (EGD);  Surgeon: Ladene Artist, MD;  Location: [facility] ENDOSCOPY;  Service: Endoscopy;  Laterality: N/A;  . Colonoscopy N/A 02/24/2015    Procedure: COLONOSCOPY;  Surgeon: Gatha Mayer, MD;  Location: [place];  Service: Endoscopy;  Laterality: N/A;  . Enteroscopy N/A 05/14/2015    Procedure: ENTEROSCOPY;  Surgeon: Irene Shipper, MD;  Location: [place];  Service: Endoscopy;  Laterality: N/A;   Family History  Problem Relation Age of Onset  . Heart disease Mother   . Diabetes Mother   . Diabetes Sister    Social History   Social History  . Marital Status: Legally Separated    Spouse Name: N/A  . Number of Children: 2  . Years of Education: 11   Occupational History  . Umemployed   . Disability     Social History Main Topics  . Smoking status: Never Smoker   . Smokeless tobacco: Never Used  . Alcohol Use: No  . Drug Use: No  . Sexual Activity: No    Other Topics Concern  . Not on file   Social History Narrative    FAMILY HISTORY:  Significant for coronary artery disease and diabetes   Patient lives at home with granddaughter.    Patient has 2 children.    Patient has 11 years of education.    Patient is on disability.    Patient is right handed.    Patient is separated.      Review of Systems: Pertinent items noted in HPI and remainder of comprehensive ROS otherwise negative.  Physical Exam: Blood pressure 88/41, pulse 74, temperature 98.5 F (36.9 C), temperature source Oral, resp. rate 20, SpO2 100 %. General appearance: fatigued, slowed mentation and sleepy and lying comfortably in bed Head: Normocephalic, without obvious abnormality, atraumatic Eyes: pale conjunctiva, anicteric Throat: normal findings: lips normal without lesions, palate normal, tongue midline and normal and soft palate, uvula, and tonsils normal Lungs: normal percussion bilaterally Heart: regular rate and rhythm and S1, S2 normal Abdomen: normal findings: bowel sounds normal and abnormal findings:  mild tenderness in the periumbilical area Extremities: Left hip tender to palpation diffusely, pain with minimal passive motion Neurologic: Mental status: alertness: lethargic, affect: normal and sleepy  Lab results: Basic Metabolic Panel:  Recent Labs  06/19/15 1554  NA 142  K 4.0  CL 106  CO2 24  GLUCOSE 123*  BUN 82*  CREATININE 1.84*  CALCIUM 8.3*   Liver Function Tests:  Recent Labs  06/19/15 1554  AST 14*  ALT 7*  ALKPHOS 84  BILITOT 0.4  PROT 6.1*  ALBUMIN 2.4*   CBC:  Recent Labs  06/19/15 1554  WBC 9.5  HGB 6.2*  HCT 19.4*  MCV 84.0  PLT 216    Assessment & Plan by Problem:  Active Problems:   Acute GI bleeding  Ms Czerwonka is a 65 y.o. Woman with a history of angiodysplasia, scleroderma/systemic sclerosis, and prior GI bleeds who  presented to the ED with one episode of rectal bleeding that occurred today and a  hemoglobin of 6.2, and low blood pressure most likely due to angiodysplasia.   Acute GI Bleed: Ms Robards has a known history of GI bleeding and angiodysplasia, with several prior episodes of BRBPR.  Since this occurrence is similar to her prior episodes, it is likely that it is due to her angiodysplasia.  Since her arrival in the ED she has not experienced further bleeding.  No indications for GI imaging at this point in time since she has a known cause and the bleeding has stopped.   --Admit to step down --2 U PRBC --1 L NS bolus given by EMS during transport --Protonix 80 mg IV daily --CBC, BMP in AM   Left Hip Pain: Complains of left hip pain that interferes with walking that has been increasing since Monday.  The hip is diffusely tender to palpation and pain is reproduced with pain.  Differential is broad and could include osteoarthritis, avascular necrosis, trochanteric bursitis.  Unlikely to be septic joint or osteomyelitis since no systemic signs of infection at this time. --Pelvic x-ray --Percocet PRN for pain  Hypotension: Blood pressure 82/41 in the ED.  Patient was sleepy/lethargic.  Likely due to hypovolemia.  Was given fluid bolus and 2 U PRBCs.   --continue MIVF _0  ml/hr for 12 hours --will hold home amiodarone, nortriptyline, pregabalin, torsemide until pressure increases  Hypothyroidism: --continue home levothyroxine, 25 mcg daily  Pulmonary Hypertension: Secondary to systemic sclerosis.  Will hold home medications at this time due to her low systemic blood pressure.  --Hold tadalafil and ambrisentan   Dispo: Will discharge after 1 day pending increase in RBC post transfusion and improvement in BP.      This is a Careers information officer Note.  The care of the patient was discussed with Dr. Lynnae January and the assessment and plan was formulated with their assistance.  Please see their note for official documentation of the patient encounter.   Signed: [place], Med  Student 06/19/2015, 5:43 PM

## 2015-06-19 NOTE — ED Notes (Signed)
Paged Dr. Loletha Grayer. Rivet's to Jerene Pitch, [place].

## 2015-06-19 NOTE — ED Notes (Signed)
MD aware of pt's hemoglobin of 6.2.

## 2015-06-19 NOTE — Telephone Encounter (Signed)
Please call pt back.

## 2015-06-19 NOTE — ED Provider Notes (Addendum)
CSN: [id number]     Arrival date & time 06/19/15  1531 History   First MD Initiated Contact with Patient 06/19/15 1610     Chief Complaint  Patient presents with  . Rectal Bleeding     (Consider location/radiation/quality/duration/timing/severity/associated sxs/prior Treatment) Patient is a 65 y.o. female presenting with hematochezia. The history is provided by the patient and a relative.  Rectal Bleeding Associated symptoms: light-headedness   Associated symptoms: no abdominal pain, no fever and no vomiting   Patient with hx chronic gi bleeding, avms, presents with gi bleeding, large amount per family, feels weak/faint. On arrival patient is hypotensive.   Pt denies nsaid or anticoag use. No abd pain. No hematemesis.  No fever or chills. Has had a couple episodes dark blood per rectum, large amount noted. Pt weak -hypotensive/limited historian - level 5 caveat.     Past Medical History  Diagnosis Date  . Secondary pulmonary hypertension ([place])     a. 2/2 scleroderma  . GERD (gastroesophageal reflux disease)   . Systemic sclerosis ([place])   . Unspecified essential hypertension   . Gastroparesis   . Sickle cell trait ([place])   . Obesity   . Scleroderma ([place])   . Trichomonas   . Peripheral neuropathy ([place])   . Type II diabetes mellitus ([facility])     a. diet control   . Arthritis   . Chronic kidney disease   . Gout   . Hypothyroidism     a. may be due to amiodarone use. started on synthroid on 12/2014 admission   . Anemia     a.  hx of GI bleed and duodenal AVMs  . Chronic diastolic CHF (congestive heart failure) ([place])     a. Echo 12/23/14 withEF 60-65%, moderately dilated RV, PASP 72, no pericardial effusion.  Marland Kitchen PAF (paroxysmal atrial fibrillation) (H[facility])     a. not a long term AC candidate due to GI bleeds  . Angiodysplasia of colon with hemorrhage    Past Surgical History  Procedure Laterality Date  . Tubal ligation    . Esophagogastroduodenoscopy  02/23/2010    D 2 AVM,  ablated with APC.   Marland Kitchen Shoulder arthroscopy Right 12/2009    subacromial decompression  . Replacement total knee Left 11/2000  . Cardiac catheterization Right 04/24/2004  . Tonsillectomy  1960's  . Abdominal hysterectomy  1983    "partial"  . Shoulder arthroscopy w/ rotator cuff repair Right 01/2010  . Excisional total knee arthroplasty with antibiotic spacers Left 08/2010    "got infected & had to take 1st replacement out"  . Revision total knee arthroplasty Left 11/2010    "removed spacers; replaced knee"  . Total knee revision with scar debridement/patella revision with poly exchange Left 12/2010    fell; knee split opened; had to redo revision"  . Cardiac catheterization Left 07/2010  . Peripherally inserted central catheter insertion  09/2010  . Colonoscopy  09/06/2008, 09/2013    int rrhoids:Dr. Lajoyce Corners 2010. Pan-colonic AVMs, microscopic colitis per Dr Deatra Ina 2015  . Right heart catheterization N/A 03/18/2014    Procedure: RIGHT HEART CATH;  Surgeon: Larey Dresser, MD;  Location: [facility] CATH LAB;  Service: Cardiovascular;  Laterality: N/A;  . Esophagogastroduodenoscopy N/A 03/23/2014    Procedure: ESOPHAGOGASTRODUODENOSCOPY (EGD);  Surgeon: Ladene Artist, MD;  Location: [facility] ENDOSCOPY;  Service: Endoscopy;  Laterality: N/A;  . Pericardial tap N/A 05/03/2014    Procedure: PERICARDIAL TAP;  Surgeon: Sinclair Grooms, MD;  Location: [facility] CATH LAB;  Service: Cardiovascular;  Laterality: N/A;  . Esophagogastroduodenoscopy N/A 02/21/2015    Procedure: ESOPHAGOGASTRODUODENOSCOPY (EGD);  Surgeon: Ladene Artist, MD;  Location: [facility] ENDOSCOPY;  Service: Endoscopy;  Laterality: N/A;  . Colonoscopy N/A 02/24/2015    Procedure: COLONOSCOPY;  Surgeon: Gatha Mayer, MD;  Location: [place];  Service: Endoscopy;  Laterality: N/A;  . Enteroscopy N/A 05/14/2015    Procedure: ENTEROSCOPY;  Surgeon: Irene Shipper, MD;  Location: [place];  Service: Endoscopy;  Laterality: N/A;   Family History  Problem Relation  Age of Onset  . Heart disease Mother   . Diabetes Mother   . Diabetes Sister    Social History  Substance Use Topics  . Smoking status: Never Smoker   . Smokeless tobacco: Never Used  . Alcohol Use: No   OB History    Gravida Para Term Preterm AB TAB SAB Ectopic Multiple Living   _0 Review of Systems  Constitutional: Negative for fever and chills.  HENT: Negative for sore throat.   Eyes: Negative for redness.  Respiratory: Negative for cough and shortness of breath.   Cardiovascular: Negative for chest pain.  Gastrointestinal: Positive for blood in stool and hematochezia. Negative for vomiting, abdominal pain and diarrhea.  Genitourinary: Negative for flank pain.  Musculoskeletal: Negative for back pain and neck pain.  Skin: Positive for pallor. Negative for rash.  Neurological: Positive for weakness and light-headedness. Negative for headaches.  Hematological: Does not bruise/bleed easily.  Psychiatric/Behavioral: Negative for confusion.      Allergies  Cephalexin; Ciprofloxacin; Codeine; Contrast media; and Iohexol  Home Medications   Prior to Admission medications   Medication Sig Start Date End Date Taking? Authorizing Provider  ADCIRCA 20 MG TABS TAKE 2 TABLETS (40MG) BY MOUTH ONCE DAILY WITH OR WITHOUT FOOD. CALL [phone] FOR REFILLS. 06/13/14   Collene Gobble, MD  ambrisentan (LETAIRIS) 10 MG tablet Take 1 tablet (10 mg total) by mouth daily. 04/11/15   Larey Dresser, MD  amiodarone (PACERONE) 200 MG tablet Take 1 tablet (200 mg total) by mouth daily. 12/17/14   Larey Dresser, MD  esomeprazole (NEXIUM) 40 MG capsule Take 40 mg by mouth 2 (two) times daily.     Historical Provider, MD  fenofibrate 54 MG tablet Take 1 tablet (54 mg total) by mouth daily. 04/07/15   Sid Falcon, MD  FLUoxetine (PROZAC) 20 MG capsule Take 1 capsule (20 mg total) by mouth every morning. 06/18/15   Sid Falcon, MD  levothyroxine (SYNTHROID, LEVOTHROID) 25 MCG  tablet Take 1 tablet (25 mcg total) by mouth daily before breakfast. 12/28/14   Eileen Stanford, PA-C  metolazone (ZAROXOLYN) 2.5 MG tablet Take 1 tablet (2.5 mg total) by mouth 2 (two) times a week. (Tuesdays and Fridays) 03/27/15   Larey Dresser, MD  nortriptyline (PAMELOR) 10 MG capsule Take 2 capsules (20 mg total) by mouth at bedtime. 03/19/15   Dennie Bible, NP  potassium chloride SA (K-DUR,KLOR-CON) 10 MEQ tablet Take 2 tablets (20 mEq total) by mouth daily. Take additional 20 meQ with metolazone doses 03/04/15   Amy D Clegg, NP  pregabalin (LYRICA) 150 MG capsule Take 1 capsule (150 mg total) by mouth 3 (three) times daily. 03/19/15   Dennie Bible, NP  promethazine (PHENERGAN) 12.5 MG tablet Take 12.5 mg by mouth every 6 (six) hours as needed for nausea or vomiting.    Historical Provider,  MD  Selexipag (UPTRAVI) 1600 MCG TABS Take 1,600 mcg by mouth 2 (two) times daily. 10/22/14   Jolaine Artist, MD  torsemide (DEMADEX) 20 MG tablet Take 60 mg by mouth 2 (two) times daily.    Historical Provider, MD   BP 88/50 mmHg  Pulse 80  Temp(Src) 98.5 F (36.9 C) (Oral)  Resp 20  SpO2 99% Physical Exam  Constitutional: She is oriented to person, place, and time.  patienti s pale, hypotensive.   HENT:  Mouth/Throat: Oropharynx is clear and moist.  Eyes: No scleral icterus.  Conj pale.   Neck: Neck supple. No tracheal deviation present.  Cardiovascular: Normal rate, regular rhythm, normal heart sounds and intact distal pulses.   Pulmonary/Chest: Effort normal and breath sounds normal. No respiratory distress.  Abdominal: Soft. Normal appearance and bowel sounds are normal. She exhibits no distension. There is no tenderness.  Genitourinary:  No cva tenderness  Musculoskeletal: She exhibits no edema or tenderness.  Neurological: She is alert and oriented to person, place, and time.  Skin: Skin is warm and dry. No rash noted. There is pallor.  Psychiatric: She has a normal  mood and affect.  Nursing note and vitals reviewed.   ED Course  Procedures (including critical care time) Labs Review   Results for orders placed or performed during the hospital encounter of 06/19/15  Comprehensive metabolic panel  Result Value Ref Range   Sodium 142 135 - 145 mmol/L   Potassium 4.0 3.5 - 5.1 mmol/L   Chloride 106 101 - 111 mmol/L   CO2 24 22 - 32 mmol/L   Glucose, Bld 123 (H) 65 - 99 mg/dL   BUN 82 (H) 6 - 20 mg/dL   Creatinine, Ser 1.84 (H) 0.44 - 1.00 mg/dL   Calcium 8.3 (L) 8.9 - 10.3 mg/dL   Total Protein 6.1 (L) 6.5 - 8.1 g/dL   Albumin 2.4 (L) 3.5 - 5.0 g/dL   AST 14 (L) 15 - 41 U/L   ALT 7 (L) 14 - 54 U/L   Alkaline Phosphatase 84 38 - 126 U/L   Total Bilirubin 0.4 0.3 - 1.2 mg/dL   GFR calc non Af Amer 28 (L) >60 mL/min   GFR calc Af Amer 33 (L) >60 mL/min   Anion gap 12 5 - 15  CBC  Result Value Ref Range   WBC 9.5 4.0 - 10.5 K/uL   RBC 2.31 (L) 3.87 - 5.11 MIL/uL   Hemoglobin 6.2 (LL) 12.0 - 15.0 g/dL   HCT 19.4 (L) 36.0 - 46.0 %   MCV 84.0 78.0 - 100.0 fL   MCH 26.8 26.0 - 34.0 pg   MCHC 32.0 30.0 - 36.0 g/dL   RDW 16.4 (H) 11.5 - 15.5 %   Platelets 216 150 - 400 K/uL  POC occult blood, ED  Result Value Ref Range   Fecal Occult Bld POSITIVE (A) NEGATIVE  Type and screen [place]  Result Value Ref Range   ABO/RH(D) O NEG    Antibody Screen POS    Sample Expiration 06/22/2015    Antibody Identification PENDING    DAT, IgG NEG    Unit Number [id number]    Blood Component Type RED CELLS,LR    Unit division 00    Status of Unit REL FROM [facility]    Unit tag comment VERBAL ORDERS PER DR STIENL    Transfusion Status OK TO TRANSFUSE    Crossmatch Result PENDING    Unit Number [id number]  Blood Component Type RED CELLS,LR    Unit division 00    Status of Unit REL FROM [facility]    Unit tag comment VERBAL ORDERS PER DR Shawnita Krizek    Transfusion Status OK TO TRANSFUSE    Crossmatch Result PENDING    Unit Number  [id number]    Blood Component Type RBC LR PHER1    Unit division 00    Status of Unit ISSUED    Unit tag comment VERBAL ORDERS PER DR Ramatoulaye Pack    Transfusion Status OK TO TRANSFUSE    Crossmatch Result PENDING    Unit Number [id number]    Blood Component Type RED CELLS,LR    Unit division 00    Status of Unit ISSUED    Unit tag comment VERBAL ORDERS PER DR Rmoni Keplinger    Transfusion Status OK TO TRANSFUSE    Crossmatch Result PENDING   Prepare RBC  Result Value Ref Range   Order Confirmation ORDER PROCESSED BY BLOOD BANK    *Note: Due to a large number of results and/or encounters for the requested time period, some results have not been displayed. A complete set of results can be found in Results Review.     EKG Interpretation  Date/Time:  Thursday June 19 2015 19:24:21 EDT Ventricular Rate:  65 PR Interval:  214 QRS Duration: 117 QT Interval:  469 QTC Calculation: 488 R Axis:   98 Text Interpretation:  Sinus rhythm Prolonged PR interval Consider left atrial enlargement Incomplete right bundle branch block Non-specific ST-t changes No significant change since last tracing Confirmed by Ashok Cordia  MD, Lennette Bihari ([id number]) on 06/19/2015 7:35:00 PM        I have personally reviewed and evaluated these images and lab results as part of my medical decision-making.    MDM   Iv ns bolus. protonix iv.  2 units emergency release/O neg blood sent for RE: hypotension, acute gi bleeding.  2nd iv line.  Continuous pulse ox and monitor. o2 [place].   After liter ns bolus, remains hypotensive - called blood bank for emergency release unit.  Primary care service consulted for admission.  protonix 80 mg iv.  Reviewed nursing notes and prior charts for additional history.   CRITICAL CARE  RE hypotension requiring emergent transfusion, acute gi bleeding, AVMs.  Performed by: Mirna Mires Total critical care time: 35 minutes Critical care time was exclusive of separately billable procedures  and treating other patients. Critical care was necessary to treat or prevent imminent or life-threatening deterioration. Critical care was time spent personally by me on the following activities: development of treatment plan with patient and/or surrogate as well as nursing, discussions with consultants, evaluation of patient's response to treatment, examination of patient, obtaining history from patient or surrogate, ordering and performing treatments and interventions, ordering and review of laboratory studies, ordering and review of radiographic studies, pulse oximetry and re-evaluation of patient's condition.          Lajean Saver, MD 06/19/15 [phone]

## 2015-06-19 NOTE — ED Notes (Signed)
Spoke to admitting MD in regards to patient's order to be admitted to tele unit and reported that patient is more appropriate for stepdown due to blood pressure. MD recommends we monitor patient at this time and administer blood and fluids to see if BP improves.

## 2015-06-19 NOTE — Telephone Encounter (Signed)
Dr Daryll Drown spoke w/ pt and her sister, pt will now go to ED

## 2015-06-19 NOTE — Telephone Encounter (Signed)
Pt's sister called and states pt has lost a great deal of blood this am - rectally, she is advised to have pt taken to ED but she refuses and states she is to come to clinic, there are no appts available, she insists pt is supposed to come to clinic for this and then pt comes on ph and states the same

## 2015-06-19 NOTE — ED Notes (Signed)
Spoke to [place] at blood bank who reports no issue time is necessary for blood due to it being Emergency Release.

## 2015-06-19 NOTE — H&P (Signed)
Date: 06/19/2015               Patient Name:  [name] MRN: [id number]  DOB: 1950-08-24 Age / Sex: 65 y.o., female   PCP: Sid Falcon, MD         Medical Service: Internal Medicine Teaching Service         Attending Physician: Dr. Bartholomew Crews, MD    First Contact: Wille Celeste Pager: [phone]  Second Contact: Dr. 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 She reports her stool was mostly dark blood. Family was present in the room and state that the bowel movement was large with a lot of blood. She reports feeling very weak, tired, and lightheaded. She does not contribute much to the history as she keeps closing her eyes to rest. She denies chest pain, SOB, abdominal pain, nausea, and vomiting. She is not on any anticoagulants.   Her Hgb was found to be 6.2 on admission. FOBT positive. She was started on 2 units PRBCs.   Meds: Current Facility-Administered Medications  Medication Dose Route Frequency Provider Last Rate Last Dose  . 0.9 %  sodium chloride infusion  10 mL/hr Intravenous Once Lajean Saver, MD      . pantoprazole (PROTONIX) 80 mg in sodium chloride 0.9 % 100 mL IVPB  80 mg Intravenous Once Lajean Saver, MD      . sodium chloride 0.9 % bolus 1,000 mL  1,000 mL Intravenous Once Juliet Rude, MD       Current Outpatient Prescriptions  Medication Sig Dispense Refill  . ADCIRCA 20 MG TABS TAKE 2 TABLETS (40MG) BY MOUTH ONCE DAILY WITH OR WITHOUT FOOD. CALL [phone] FOR REFILLS. 60 tablet 11   . ambrisentan (LETAIRIS) 10 MG tablet Take 1 tablet (10 mg total) by mouth daily. 30 tablet 11  . amiodarone (PACERONE) 200 MG tablet Take 1 tablet (200 mg total) by mouth daily. 30 tablet 6  . esomeprazole (NEXIUM) 40 MG capsule Take 40 mg by mouth 2 (two) times daily.     . fenofibrate 54 MG tablet Take 1 tablet (54 mg total) by mouth daily. 30 tablet 12  . FLUoxetine (PROZAC) 20 MG capsule Take 1 capsule (20 mg total) by mouth every morning. 30 capsule 5  . levothyroxine (SYNTHROID, LEVOTHROID) 25 MCG tablet Take 1 tablet (25 mcg total) by mouth daily before breakfast. 30 tablet 11  . metolazone (ZAROXOLYN) 2.5 MG tablet Take 1 tablet (2.5 mg total) by mouth 2 (two) times a week. (Tuesdays and Fridays) 20 tablet 3  . nortriptyline (PAMELOR) 10 MG capsule Take 2 capsules (20 mg total) by mouth at bedtime. 60 capsule 8  . potassium chloride SA (K-DUR,KLOR-CON) 10 MEQ tablet Take 2 tablets (20 mEq total) by mouth daily. Take additional 20 meQ with metolazone doses 120 tablet 2  . pregabalin (LYRICA) 150 MG capsule Take 1 capsule (150 mg total) by mouth 3 (three) times daily. 90 capsule 5  . promethazine (PHENERGAN) 12.5 MG tablet Take  12.5 mg by mouth every 6 (six) hours as needed for nausea or vomiting.    . Selexipag (UPTRAVI) 1600 MCG TABS Take 1,600 mcg by mouth 2 (two) times daily. 60 tablet 3  . torsemide (DEMADEX) 20 MG tablet Take 60 mg by mouth 2 (two) times daily.      Allergies: Allergies as of 06/19/2015 - Review Complete 06/19/2015  Allergen Reaction Noted  . Cephalexin Rash 05/01/2010  . Ciprofloxacin Rash 07/23/2010  . Codeine Other (See Comments)   . Contrast media [iodinated diagnostic agents] Hives 08/03/2011  . Iohexol Hives 03/04/2008   Past Medical History  Diagnosis Date  . Secondary pulmonary hypertension ([place])     a. 2/2 scleroderma  . GERD (gastroesophageal reflux disease)   . 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 Unspecified essential hypertension   . Gastroparesis     . Sickle cell trait ([place])   . Obesity   . Scleroderma ([place])   . Trichomonas   . Peripheral neuropathy ([place])   . Type II diabetes mellitus ([facility])     a. diet control   . Arthritis   . Chronic kidney disease   . Gout   . Hypothyroidism     a. may be due to amiodarone use. started on synthroid on 12/2014 admission   . Anemia     a.  hx of GI bleed and duodenal AVMs  . Chronic diastolic CHF (congestive heart failure) ([place])     a. Echo 12/23/14 withEF 60-65%, moderately dilated RV, PASP 72, no pericardial effusion.  Marland Kitchen PAF (paroxysmal atrial fibrillation) ([facility])     a. not a long term AC candidate due to GI bleeds  . Angiodysplasia of colon with hemorrhage    Past Surgical History  Procedure Laterality Date  . Tubal ligation    . Esophagogastroduodenoscopy  02/23/2010    D 2 AVM, ablated with APC.   Marland Kitchen Shoulder arthroscopy Right 12/2009    subacromial decompression  . Replacement total knee Left 11/2000  . Cardiac catheterization Right 04/24/2004  . Tonsillectomy  1960's  . Abdominal hysterectomy  1983    "partial"  . Shoulder arthroscopy w/ rotator cuff repair Right 01/2010  . Excisional total knee arthroplasty with antibiotic spacers Left 08/2010    "got infected & had to take 1st replacement out"  . Revision total knee arthroplasty Left 11/2010    "removed spacers; replaced knee"  . Total knee revision with scar debridement/patella revision with poly exchange Left 12/2010    fell; knee split opened; had to redo revision"  . Cardiac catheterization Left 07/2010  . Peripherally inserted central catheter insertion  09/2010  . Colonoscopy  09/06/2008, 09/2013    int rrhoids:Dr. Lajoyce Corners 2010. Pan-colonic AVMs, microscopic colitis per Dr Deatra Ina 2015  . Right heart catheterization N/A 03/18/2014    Procedure: RIGHT HEART CATH;  Surgeon: Larey Dresser, MD;  Location: [facility] CATH LAB;  Service: Cardiovascular;  Laterality: N/A;  . Esophagogastroduodenoscopy N/A 03/23/2014    Procedure:  ESOPHAGOGASTRODUODENOSCOPY (EGD);  Surgeon: Ladene Artist, MD;  Location: [facility] ENDOSCOPY;  Service: Endoscopy;  Laterality: N/A;  . Pericardial tap N/A 05/03/2014    Procedure: PERICARDIAL TAP;  Surgeon: Sinclair Grooms, MD;  Location: [facility] CATH LAB;  Service: Cardiovascular;  Laterality: N/A;  . Esophagogastroduodenoscopy N/A 02/21/2015    Procedure: ESOPHAGOGASTRODUODENOSCOPY (EGD);  Surgeon: Ladene Artist, MD;  Location: [facility] ENDOSCOPY;  Service: Endoscopy;  Laterality: N/A;  . Colonoscopy N/A 02/24/2015    Procedure: COLONOSCOPY;  Surgeon:  Gatha Mayer, MD;  Location: [place];  Service: Endoscopy;  Laterality: N/A;  . Enteroscopy N/A 05/14/2015    Procedure: ENTEROSCOPY;  Surgeon: Irene Shipper, MD;  Location: [place];  Service: Endoscopy;  Laterality: N/A;   Family History  Problem Relation Age of Onset  . Heart disease Mother   . Diabetes Mother   . Diabetes Sister    Social History   Social History  . Marital Status: Legally Separated    Spouse Name: N/A  . Number of Children: 2  . Years of Education: 11   Occupational History  . Umemployed   . Disability     Social History Main Topics  . Smoking status: Never Smoker   . Smokeless tobacco: Never Used  . Alcohol Use: No  . Drug Use: No  . Sexual Activity: No   Other Topics Concern  . Not on file   Social History Narrative    FAMILY HISTORY:  Significant for coronary artery disease and diabetes   Patient lives at home with granddaughter.    Patient has 2 children.    Patient has 11 years of education.    Patient is on disability.    Patient is right handed.    Patient is separated.      Review of Systems: General: Denies fever, chills, night sweats, changes in weight, changes in appetite HEENT: Denies headaches, ear pain, changes in vision, rhinorrhea, sore throat CV: Denies palpitations, orthopnea Pulm: Denies cough, wheezing GI: See HPI GU: Denies dysuria, hematuria, frequency Msk: Denies muscle  cramps, joint pains Neuro: Denies weakness, numbness, tingling Skin: Denies rashes, bruising Psych: Denies depression, anxiety, hallucinations  Physical Exam: Blood pressure 88/41, pulse 74, temperature 98.5 F (36.9 C), temperature source Oral, resp. rate 20, SpO2 100 %. General: African [place] woman resting in bed, lethargic HEENT: [place]/AT, EOMI, conjunctiva pale, sclera anicteric, mucus membranes moist CV: RRR, no m/g/r Pulm: CTA bilaterally, breaths non-labored Abd: BS+, soft, mild tenderness diffusely, no rebound or guarding  Ext: warm, no peripheral edema. Scar on left knee from prior knee replacement. Tenderness to palpation of left hip and when left leg is raised.  Neuro: alert and oriented x 3, no focal deficits  Lab results: Basic Metabolic Panel:  Recent Labs  06/19/15 1554  NA 142  K 4.0  CL 106  CO2 24  GLUCOSE 123*  BUN 82*  CREATININE 1.84*  CALCIUM 8.3*   Liver Function Tests:  Recent Labs  06/19/15 1554  AST 14*  ALT 7*  ALKPHOS 84  BILITOT 0.4  PROT 6.1*  ALBUMIN 2.4*   CBC:  Recent Labs  06/19/15 1554  WBC 9.5  HGB 6.2*  HCT 19.4*  MCV 84.0  PLT 216   Urine Drug Screen: Drugs of Abuse     Component Value Date/Time   LABOPIA POSITIVE* 10/02/2010 1128   COCAINSCRNUR NONE DETECTED 10/02/2010 1128   LABBENZ NONE DETECTED 10/02/2010 1128   AMPHETMU NONE DETECTED 10/02/2010 1128   THCU NONE DETECTED 10/02/2010 1128   [place] DETECTED 10/02/2010 1128     Other results: EKG: Pending   Assessment & Plan by Problem:  Acute GI Bleeding: She has a known history of GI bleeding secondary to AVMs. She had a large bloody bowel movement today and presented with a Hgb 6.2 and was mildly hypotensive. She received 2 units PRBCs in the ED. She is not currently bleeding now. Will continue her blood transfusions and give her IVFs to help support her  BP, although will caution with her hx of diastolic CHF.  - Transfuse 2 units PRBCs - IV  Protonix 80 mg - NPO - NS @ 100 ml/hr - CBC in AM - Check PT/INR  AKI on CKD Stage 3: Cr 1.84 on admission. Her baseline is around 1.2. Likely due to volume depletion in setting of acute GI bleeding. She already received 1 L NS by EMS and will be getting maintenance fluids in addition to blood transfusion. Will follow up Cr in AM. - bmet in AM  Left Hip Pain: She complains of left hip pain that started about 3 days ago. Denies any trauma or falls. Will check a plain film for further evaluation. - f/u hip x-ray  Scleroderma with Pulmonary HTN: Last Echo in Feb 2017 shows PAP 76 mmHg. She had a cardiac cath in Jan 2016 which showed a PAP of 68/29 consistent with moderate pulm HTN. She follows with Dr. Lamonte Sakai as an outpatient. She had PFTs in 2004 which showed a decreased diffusion capacity of 52%. I do not see a high resolution CT scan of her chest which should be performed with her hx of scleroderma to look for ILD. This can be done as an outpatient.  - Hold home Selexipag for now, resume when able to take PO  Chronic Diastolic CHF: Last echo in Oct 2016 with EF 09-47%, grade 2 diastolic dysfunction. She follows with Dr. Aundra Dubin as an outpatient. She is on Metolazone 2.5 mg twice weekly and Torsemide 60 mg BID. She does not appear volume overloaded exam, especially in setting of GI bleeding. Her Cr is elevated as well likely to volume loss.  - Hold all home meds - bmet in AM - Restart diuretics when able   Hypothyroidism: Last TSH 3.853 in Feb 2017. She takes Synthroid 25 mcg daily at home. - Hold all PO home meds for now  - Restart synthroid in AM  Paroxysmal AFib: On amiodarone 200 mg daily. Not on anticoagulation due to history of GI bleeding. In NSR currently. - Holding all home PO meds for now with GI bleeding - Can restart in AM  Type 2 DM: Diet controlled. Last HbA1c 5.0 in Feb 2017. - Monitor CBGs on bmet   Diet: NPO for now VTE PPx: SCDs given acute GI bleeding  Dispo:  Disposition is deferred at this time, awaiting improvement of current medical problems. Anticipated discharge in approximately 1-2 day(s).   The patient does have a current PCP Sid Falcon, MD) and does need an [facility] hospital follow-up appointment after discharge.  The patient does not have transportation limitations that hinder transportation to clinic appointments.  Signed: Juliet Rude, MD 06/19/2015, 5:45 PM

## 2015-06-20 DIAGNOSIS — L8990 Pressure ulcer of unspecified site, unspecified stage: Secondary | ICD-10-CM | POA: Insufficient documentation

## 2015-06-20 DIAGNOSIS — M3489 Other systemic sclerosis: Secondary | ICD-10-CM

## 2015-06-20 DIAGNOSIS — Z7901 Long term (current) use of anticoagulants: Secondary | ICD-10-CM

## 2015-06-20 LAB — PREPARE RBC (CROSSMATCH)

## 2015-06-20 LAB — HEMOGLOBIN AND HEMATOCRIT, BLOOD
HCT: 21.4 % — ABNORMAL LOW (ref 36.0–46.0)
HCT: 22.7 % — ABNORMAL LOW (ref 36.0–46.0)
HCT: 24.1 % — ABNORMAL LOW (ref 36.0–46.0)
HEMOGLOBIN: 7 g/dL — AB (ref 12.0–15.0)
HEMOGLOBIN: 8 g/dL — AB (ref 12.0–15.0)
Hemoglobin: 7.3 g/dL — ABNORMAL LOW (ref 12.0–15.0)

## 2015-06-20 LAB — GLUCOSE, CAPILLARY: Glucose-Capillary: 70 mg/dL (ref 65–99)

## 2015-06-20 LAB — CBC
HCT: 20.7 % — ABNORMAL LOW (ref 36.0–46.0)
HCT: 21.8 % — ABNORMAL LOW (ref 36.0–46.0)
Hemoglobin: 7 g/dL — ABNORMAL LOW (ref 12.0–15.0)
Hemoglobin: 7 g/dL — ABNORMAL LOW (ref 12.0–15.0)
MCH: 28.5 pg (ref 26.0–34.0)
MCH: 26.8 pg (ref 26.0–34.0)
MCHC: 33.8 g/dL (ref 30.0–36.0)
MCHC: 32.1 g/dL (ref 30.0–36.0)
MCV: 84.1 fL (ref 78.0–100.0)
MCV: 83.5 fL (ref 78.0–100.0)
PLATELETS: 172 10*3/uL (ref 150–400)
Platelets: 176 K/uL (ref 150–400)
RBC: 2.46 MIL/uL — ABNORMAL LOW (ref 3.87–5.11)
RBC: 2.61 MIL/uL — ABNORMAL LOW (ref 3.87–5.11)
RDW: 15.6 % — ABNORMAL HIGH (ref 11.5–15.5)
RDW: 15.9 % — ABNORMAL HIGH (ref 11.5–15.5)
WBC: 6.4 10*3/uL (ref 4.0–10.5)
WBC: 6.1 K/uL (ref 4.0–10.5)

## 2015-06-20 LAB — BASIC METABOLIC PANEL
ANION GAP: 12 (ref 5–15)
BUN: 72 mg/dL — AB (ref 6–20)
CO2: 22 mmol/L (ref 22–32)
Calcium: 8.7 mg/dL — ABNORMAL LOW (ref 8.9–10.3)
Chloride: 109 mmol/L (ref 101–111)
Creatinine, Ser: 1.57 mg/dL — ABNORMAL HIGH (ref 0.44–1.00)
GFR calc Af Amer: 39 mL/min — ABNORMAL LOW (ref >60)
GFR, EST NON AFRICAN AMERICAN: 34 mL/min — AB (ref >60)
GLUCOSE: 87 mg/dL (ref 65–99)
Potassium: 3.6 mmol/L (ref 3.5–5.1)
Sodium: 143 mmol/L (ref 135–145)

## 2015-06-20 LAB — HIV ANTIBODY (ROUTINE TESTING W REFLEX): HIV Screen 4th Generation wRfx: NONREACTIVE

## 2015-06-20 LAB — MRSA PCR SCREENING: MRSA BY PCR: NEGATIVE

## 2015-06-20 MED ORDER — AMBRISENTAN 5 MG PO TABS
10.0000 mg | ORAL_TABLET | Freq: Every day | ORAL | Status: DC
Start: 2015-06-20 — End: 2015-06-23
  Administered 2015-06-23: 10 mg via ORAL
  Filled 2015-06-20 (×2): qty 2

## 2015-06-20 MED ORDER — SODIUM CHLORIDE 0.9 % IV BOLUS (SEPSIS)
1000.0000 mL | Freq: Once | INTRAVENOUS | Status: AC
  Administered 2015-06-20: 1000 mL via INTRAVENOUS

## 2015-06-20 MED ORDER — LEVOTHYROXINE SODIUM 25 MCG PO TABS
25.0000 ug | ORAL_TABLET | Freq: Every day | ORAL | Status: DC
  Administered 2015-06-21 – 2015-06-23 (×3): 25 ug via ORAL
  Filled 2015-06-20 (×3): qty 1

## 2015-06-20 MED ORDER — NORTRIPTYLINE HCL 10 MG PO CAPS
20.0000 mg | ORAL_CAPSULE | Freq: Every day | ORAL | Status: DC
  Administered 2015-06-20 – 2015-06-22 (×3): 20 mg via ORAL
  Filled 2015-06-20 (×3): qty 2

## 2015-06-20 MED ORDER — FLUOXETINE HCL 20 MG PO CAPS
20.0000 mg | ORAL_CAPSULE | Freq: Every morning | ORAL | Status: DC
  Administered 2015-06-20 – 2015-06-23 (×4): 20 mg via ORAL
  Filled 2015-06-20 (×4): qty 1

## 2015-06-20 MED ORDER — SODIUM CHLORIDE 0.9 % IV SOLN
Freq: Once | INTRAVENOUS | Status: AC
  Administered 2015-06-20: 09:00:00 via INTRAVENOUS

## 2015-06-20 MED ORDER — FENOFIBRATE 54 MG PO TABS
54.0000 mg | ORAL_TABLET | Freq: Every day | ORAL | Status: DC
  Administered 2015-06-20 – 2015-06-23 (×4): 54 mg via ORAL
  Filled 2015-06-20 (×4): qty 1

## 2015-06-20 MED ORDER — AMIODARONE HCL 200 MG PO TABS
200.0000 mg | ORAL_TABLET | Freq: Every day | ORAL | Status: DC
  Administered 2015-06-20 – 2015-06-23 (×4): 200 mg via ORAL
  Filled 2015-06-20 (×4): qty 1

## 2015-06-20 MED ORDER — SODIUM CHLORIDE 0.9 % IV SOLN
Freq: Once | INTRAVENOUS | Status: AC
  Administered 2015-06-20: 17:00:00 via INTRAVENOUS

## 2015-06-20 MED ORDER — SODIUM CHLORIDE 0.9 % IV SOLN
INTRAVENOUS | Status: AC
  Administered 2015-06-20: 20:00:00 via INTRAVENOUS

## 2015-06-20 MED ORDER — ACETAMINOPHEN 325 MG PO TABS
650.0000 mg | ORAL_TABLET | Freq: Four times a day (QID) | ORAL | Status: DC | PRN
  Administered 2015-06-20 – 2015-06-21 (×4): 650 mg via ORAL
  Filled 2015-06-20 (×4): qty 2

## 2015-06-20 MED ORDER — SELEXIPAG 1600 MCG PO TABS: 1600.0000 ug | ORAL_TABLET | Freq: Two times a day (BID) | ORAL | Status: DC

## 2015-06-20 MED ORDER — PREGABALIN 75 MG PO CAPS
150.0000 mg | ORAL_CAPSULE | Freq: Three times a day (TID) | ORAL | Status: DC
  Administered 2015-06-20 – 2015-06-23 (×8): 150 mg via ORAL
  Filled 2015-06-20 (×8): qty 2

## 2015-06-20 MED ORDER — TADALAFIL (PAH) 20 MG PO TABS: 40.0000 mg | ORAL_TABLET | Freq: Every day | ORAL | Status: DC

## 2015-06-20 MED ORDER — PANTOPRAZOLE SODIUM 40 MG IV SOLR
40.0000 mg | Freq: Two times a day (BID) | INTRAVENOUS | Status: DC
  Administered 2015-06-20 – 2015-06-22 (×4): 40 mg via INTRAVENOUS
  Filled 2015-06-20 (×4): qty 40

## 2015-06-20 MED ORDER — SODIUM CHLORIDE 0.9 % IV BOLUS (SEPSIS)
250.0000 mL | Freq: Once | INTRAVENOUS | Status: AC
  Administered 2015-06-20: 250 mL via INTRAVENOUS

## 2015-06-20 NOTE — Progress Notes (Signed)
Patient continues to have bloody stool. While walking pt was dripping blood and stool. Pt reports that her abdomen and left hip hurt. MD paged to notify.

## 2015-06-20 NOTE — Discharge Instructions (Signed)
It was a pleasure to take care of you, [name].  You were admitted to the hospital due to an intestinal bleed.  You were given 3 units of blood.  Please refer to the following instructions.  1) You have a follow-up appointment in the Internal Medicine clinic on Wednesday, April 12, at 9:00 am.   2) It is likely you will have another intestinal bleed because of your condition.  When you have another bleed in the future and it is a WEEKDAY (M-F), please go to the Internal Medicine Clinic to get checked to see if you need to be given blood.  You can go to the hospital if you have bleeding on a weekend (Saturday or Sunday).  If you go to the hospital, it is likely that you will have to stay in the hospital.  But if you go to the clinic, you will be able to get blood and go home.   3) Please use your walker and cane as directed by the physical therapist.   4) If you have bleeding that does not stop, then go to the Emergency Room.

## 2015-06-20 NOTE — Progress Notes (Signed)
Paged MD about patient experiencing left hip pain and not having any PRN medication orders. MD will address.

## 2015-06-20 NOTE — Progress Notes (Signed)
MD paged about pt's blood pressure being 76/47 after receiving one unit of blood per order.

## 2015-06-20 NOTE — Progress Notes (Signed)
  Date: 06/20/2015  Patient name: [name]  Medical record number: [id number]  Date of birth: [birth year]   I have seen and evaluated [name] and discussed their care with the Residency Team. [name] is a 65 yo female with pul HTn 2/2 scleroderma, p A Fib, and chronic diastolic HF. She has h/o recurrent GI bleeding 2/2 AVM. Her last EGD 05/14/15 with ablation of AVM. She was D/C'd 3/2 and was seen 3/21 for HFU. At that time , her HgB was 7.5, down from 8.5 and she got 2 units PRBC in short stay. She has required multiple transfusions PRBC. She was at home and ha a lg bloody BM. She called the [facility] and was told to come to the ED and got admitted with HgB 6.2 she receive 2 units PRBC and her HgB only increased to 7.0. She is receiving another 2 units.   PMHx, Fam Hx, and/or Soc Hx : Per Dr Rivet's H&P  Filed Vitals:   06/20/15 1147 06/20/15 1310  BP: 87/48 94/50  Pulse: 70 71  Temp: 98 F (36.7 C)   Resp: 14 14   Gen : Tired, opens eyes and answers simpel questions but then falls asleep. HRRR LCTAB laterally ABD + BS Ext no edema  I indep viewed her EKG and she had sinus with 1st degree AVB.  Assessment and Plan: I have seen and evaluated the patient as outlined above. I agree with the formulated Assessment and Plan as detailed in the residents' note, with the following changes:   1. Acute GI bleed and blood loss anemia - The likely source is her known sm bowel AVM. Her vitals are stable. Her HgB did not respond appropriately which likely indicates ongoing, slow blood loss. She is going to receive an additional 2 units now (total of 4 this admit). She is going to cont to have blood loss and require transfusions. I discussed with Dr Daryll Drown her PCP and the team. The goal is to keep her out of the ED and transfuse in short stay if possible. We will try to get palliative care involved in the outpt setting and see if they can coordinate weekly HgB and short stay admits for PRBC when  needed.  Bartholomew Crews, MD 4/7/20171:54 PM

## 2015-06-20 NOTE — Evaluation (Signed)
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  Clinical Impression  Patient presents with decreased mobility due to deficits listed in PT problem list.  She will benefit from skilled PT in the acute setting to allow return home with family support and follow up HHPT.      Follow Up Recommendations Home health PT    Equipment Recommendations  3in1 (PT);Other (comment) (shower chair)    Recommendations for Other Services       Precautions / Restrictions Precautions Precautions: Fall Precaution Comments: low BP, check for bloody stools (wear pad in hallway)      Mobility  Bed Mobility Overal bed mobility: Needs Assistance Bed Mobility: Supine to Sit;Sit to Supine     Supine to sit: Min assist;HOB elevated Sit to supine: Min assist   General bed mobility comments: increased time, use of rail, assist to scoot to EOB  Transfers Overall transfer level: Needs assistance Equipment used: Rolling walker (2 wheeled) Transfers: Sit to/from Stand Sit to Stand: Min assist         General transfer comment: up from bed, 3:1 over commode  Ambulation/Gait Ambulation/Gait assistance: Min guard;Supervision Ambulation Distance (Feet): 130 Feet Assistive device: Rolling walker (2 wheeled) Gait Pattern/deviations: Step-through pattern;Decreased stride length;Trunk flexed;Antalgic     General Gait Details: c/o pain left hip with ambulation; incontinent of stool in hallway despite toileting prior to ambulation; positive blood in stool RN aware  Stairs            Wheelchair  Mobility    Modified Rankin (Stroke Patients Only)       Balance Overall balance assessment: Needs assistance   Sitting balance-Leahy Scale: Fair     Standing balance support: Bilateral upper extremity supported Standing balance-Leahy Scale: Poor Standing balance comment: UE support needed for balance due to pain and weakness, assisted with hygiene after toileting so pt could keep both hands on walker                             Pertinent Vitals/Pain Pain Assessment: 0-10 Pain Score: 8  Pain Location: L hip with ambulation, also c/o stomach pain Pain Descriptors / Indicators: Aching Pain Intervention(s): Patient requesting pain meds-RN notified;Monitored during session    Home Living Family/patient expects to be discharged to:: Private residence Living Arrangements: Other relatives Advertising account executive) Available Help at Discharge: Family;Available PRN/intermittently (grandaughter is a Ship broker, sister can assist) Type of Home: Apartment Home Access: Level entry     Home Layout: One level Home Equipment: Walker - 2 wheels;Walker - 4 wheels;Cane - single point;Bedside commode      Prior Function Level of Independence: Independent with assistive device(s)         Comments: using walker due to L hip pain; assist for transportation     Hand Dominance        Extremity/Trunk Assessment               Lower Extremity Assessment: Generalized weakness      Cervical / Trunk Assessment: Kyphotic  Communication   Communication: No difficulties  Cognition Arousal/Alertness: Awake/alert  Behavior During Therapy: WFL for tasks assessed/performed Overall Cognitive Status: Within Functional Limits for tasks assessed                      General Comments General comments (skin integrity, edema, etc.): also with some skin breakdown on buttocks with dressing on pt reports due to scratcing skin off    Exercises        Assessment/Plan    PT  Assessment Patient needs continued PT services  PT Diagnosis Difficulty walking;Generalized weakness   PT Problem List Decreased strength;Decreased balance;Decreased mobility;Pain;Decreased activity tolerance  PT Treatment Interventions DME instruction;Gait training;Balance training;Functional mobility training;Therapeutic activities;Therapeutic exercise;Patient/family education   PT Goals (Current goals can be found in the Care Plan section) Acute Rehab PT Goals Patient Stated Goal: To go home PT Goal Formulation: With patient/family Time For Goal Achievement: 06/27/15 Potential to Achieve Goals: Fair    Frequency Min 3X/week   Barriers to discharge        Co-evaluation               End of Session Equipment Utilized During Treatment: Gait belt Activity Tolerance: Patient limited by pain;Patient limited by fatigue Patient left: in bed;with call bell/phone within reach;with family/visitor present           Time: 1525-1609 PT Time Calculation (min) (ACUTE ONLY): 44 min   Charges:   PT Evaluation $PT Eval High Complexity: 1 Procedure PT Treatments $Gait Training: 23-37 mins   PT G Codes:        Reginia Naas 10-Jul-2015, 5:13 PM  Magda Kiel, [place] Jul 10, 2015

## 2015-06-20 NOTE — Progress Notes (Addendum)
Subjective: Hgb 7.0 this AM. She denies any additional bloody bowel movements. She denies any abdominal pain, nausea, or vomiting. She is lethargic this morning but able to answer questions.   Objective: Vital signs in last 24 hours: Filed Vitals:   06/20/15 0755 06/20/15 0843 06/20/15 0919 06/20/15 1048  BP: _0 76/47  Pulse: 70 69 72 71  Temp: 98.6 F (37 C) 98.9 F (37.2 C) 98.5 F (36.9 C) 98.7 F (37.1 C)  TempSrc: Oral Oral Oral Oral  Resp: _1 Height:      Weight:      SpO2: 95% 96% 98% 96%   Weight change:   Intake/Output Summary (Last 24 hours) at 06/20/15 1049 Last data filed at 06/20/15 0837  Gross per 24 hour  Intake 688.33 ml  Output    700 ml  Net -11.67 ml  Physical Exam General: resting in bed, NAD HEENT: [place]/AT, EOMI, sclera anicteric, conjunctiva pale CV: RRR, no m/g/r Pulm: CTA bilaterally, breaths non-labored Abd: BS+, soft, non-tender Ext: warm, no peripheral edema Neuro: alert and oriented x 3, lethargic but easily arousable  Lab Results: Basic Metabolic Panel:  Recent Labs Lab 06/19/15 1554 06/20/15 0426  NA 142 143  K 4.0 3.6  CL 106 109  CO2 24 22  GLUCOSE 123* 87  BUN 82* 72*  CREATININE 1.84* 1.57*  CALCIUM 8.3* 8.7*   Liver Function Tests:  Recent Labs Lab 06/19/15 1554  AST 14*  ALT 7*  ALKPHOS 84  BILITOT 0.4  PROT 6.1*  ALBUMIN 2.4*   CBC:  Recent Labs Lab 06/19/15 1554 06/20/15 0220 06/20/15 0426  WBC 9.5  --  6.4  HGB 6.2* 7.0* 7.0*  HCT 19.4* 21.4* 20.7*  MCV 84.0  --  84.1  PLT 216  --  172   CBG:  Recent Labs Lab 06/20/15 0810  GLUCAP 70   Coagulation:  Recent Labs Lab 06/19/15 2249  LABPROT 17.9*  INR 1.47   Urine Drug Screen: Drugs of Abuse     Component Value Date/Time   LABOPIA POSITIVE* 10/02/2010 1128   COCAINSCRNUR NONE DETECTED 10/02/2010 1128   LABBENZ NONE DETECTED 10/02/2010 1128   AMPHETMU NONE DETECTED 10/02/2010 1128   THCU NONE DETECTED  10/02/2010 1128   LABBARB NONE DETECTED 10/02/2010 1128     Micro Results: Recent Results (from the past 240 hour(s))  MRSA PCR Screening     Status: None   Collection Time: 06/19/15 10:30 PM  Result Value Ref Range Status   MRSA by PCR NEGATIVE NEGATIVE Final    Comment:        The GeneXpert MRSA Assay (FDA approved for NASAL specimens only), is one component of a comprehensive MRSA colonization surveillance program. It is not intended to diagnose MRSA infection nor to guide or monitor treatment for MRSA infections.    Studies/Results: Dg Hip Unilat With Pelvis 2-3 Views Left  06/19/2015  CLINICAL DATA:  Left hip pain. EXAM: DG HIP (WITH OR WITHOUT PELVIS) 2-3V LEFT COMPARISON:  CT of the abdomen and pelvis on 05/12/2015 FINDINGS: Mild superior joint space narrowing is consistent with osteoarthritis. No fracture, dislocation or bony lesion identified. The bony pelvis is unremarkable. IMPRESSION: Mild osteoarthritis of the left hip.  No acute fracture identified. Electronically Signed   By: Aletta Edouard M.D.   On: 06/19/2015 21:42   Medications: I have reviewed the patient's current medications. Scheduled Meds: . sodium chloride flush  3 mL Intravenous Q12H  Continuous Infusions:  PRN Meds:. Assessment/Plan: Acute GI Bleeding: She has a known history of GI bleeding secondary to AVMs. Hgb 6.2 on admission. She received 2 units PRBCs and subsequent Hgb was 7.0. She is getting an additional 1 unit PRBCs this morning for total of 3 units.  She is not currently bleeding now. Her BPs are in the 24S-97N systolic but this is normal for her.   - Transfuse 1 unit PRBCs - IV Protonix 80 mg, switch to home Nexium at discharge  - Will give 250 cc NS bolus to help support BP  AKI on CKD Stage 3: Cr 1.84 on admission with her baseline around 1.2. Cr today slightly improved to 1.57. Likely due to volume depletion in setting of acute GI bleeding.  - Will need repeat bmet at outpatient visit    Left Hip Pain: She complains of left hip pain that started about 3 days ago. Denies any trauma or falls. Plain film shows OA. Will have PT/OT work with her today before discharge. - PT/OT ordered   Scleroderma with Pulmonary HTN: Last Echo in Feb 2017 shows PAP 76 mmHg. She had a cardiac cath in Jan 2016 which showed a PAP of 68/29 consistent with moderate pulm HTN. She follows with Dr. Lamonte Sakai as an outpatient. She had PFTs in 2004 which showed a decreased diffusion capacity of 52%. I do not see a high resolution CT scan of her chest which should be performed with her hx of scleroderma to look for ILD. This can be done as an outpatient.  - Restart home Selexipag and Ambrisentan  Chronic Diastolic CHF: Last echo in Oct 2016 with EF 30-05%, grade 2 diastolic dysfunction. She follows with Dr. Aundra Dubin as an outpatient. She is on Metolazone 2.5 mg twice weekly and Torsemide 60 mg BID. She does not appear volume overloaded exam, especially in setting of GI bleeding. Her Cr is elevated as well likely to volume loss.  - Will hold her diuretics for now as she still seems dry - Will have close follow up in clinic  Hypothyroidism: Last TSH 3.853 in Feb 2017. She takes Synthroid 25 mcg daily at home. - Restart home Synthroid   Paroxysmal AFib: On amiodarone 200 mg daily. Not on anticoagulation due to history of GI bleeding. In NSR currently. - Restart home amiodarone   Type 2 DM: Diet controlled. Last HbA1c 5.0 in Feb 2017. - Monitor CBGs on bmet   Diet: Carb modified  VTE: SCDs Dispo: Discharge likely today  The patient does have a current PCP Sid Falcon, MD) and does need an [facility] hospital follow-up appointment after discharge.  The patient does not have transportation limitations that hinder transportation to clinic appointments.  .Services Needed at time of discharge: Y = Yes, Blank = No PT:   OT:   RN:   Equipment:   Other:     LOS: 1 day   Juliet Rude, MD 06/20/2015, 10:49 AM

## 2015-06-20 NOTE — Progress Notes (Signed)
Subjective: [name] says she is a little more awake today but still falls asleep during the interview.  She is complaining of foot pain.  She states that she usually gets around the house with a walker or a cane but says that she is able to fix her own food and get to the bathroom without assistance of others.  She agreed to see a physical therapist while she is in the hospital.   Objective: Vital signs in last 24 hours: Filed Vitals:   06/20/15 0919 06/20/15 1048 06/20/15 1147 06/20/15 1310  BP: 81/45 76/47 87/48 94/50  Pulse: 72 71 70 71  Temp: 98.5 F (36.9 C) 98.7 F (37.1 C) 98 F (36.7 C)   TempSrc: Oral Oral Oral   Resp: _0 Height:      Weight:      SpO2: 98% 96% 100% 99%   Weight change:   Intake/Output Summary (Last 24 hours) at 06/20/15 1347 Last data filed at 06/20/15 1048  Gross per 24 hour  Intake 1263.33 ml  Output    700 ml  Net 563.33 ml   General appearance: cooperative, fatigued and slowed mentation Head: Normocephalic, without obvious abnormality, atraumatic Lungs: clear to auscultation bilaterally Heart: regular rate and rhythm Abdomen: normal findings: bowel sounds normal Neurologic: Grossly normal Lab Results: Basic Metabolic Panel:  Recent Labs Lab 06/19/15 1554 06/20/15 0426  NA 142 143  K 4.0 3.6  CL 106 109  CO2 24 22  GLUCOSE 123* 87  BUN 82* 72*  CREATININE 1.84* 1.57*  CALCIUM 8.3* 8.7*   Liver Function Tests:  Recent Labs Lab 06/19/15 1554  AST 14*  ALT 7*  ALKPHOS 84  BILITOT 0.4  PROT 6.1*  ALBUMIN 2.4*  CBC:  Recent Labs Lab 06/19/15 1554  06/20/15 0426 06/20/15 1234  WBC 9.5  --  6.4  --   HGB 6.2*  < > 7.0* 7.3*  HCT 19.4*  < > 20.7* 22.7*  MCV 84.0  --  84.1  --   PLT 216  --  172  --   < > = values in this interval not displayed. Cardiac Enzymes: No results for input(s): CKTOTAL, CKMB, CKMBINDEX, TROPONINI in the last 168 hours. BNP: No results for input(s): PROBNP in the last 168  hours. D-Dimer: No results for input(s): DDIMER in the last 168 hours. CBG:  Recent Labs Lab 06/20/15 0810  GLUCAP 70    Micro Results: Recent Results (from the past 240 hour(s))  MRSA PCR Screening     Status: None   Collection Time: 06/19/15 10:30 PM  Result Value Ref Range Status   MRSA by PCR NEGATIVE NEGATIVE Final    Comment:        The GeneXpert MRSA Assay (FDA approved for NASAL specimens only), is one component of a comprehensive MRSA colonization surveillance program. It is not intended to diagnose MRSA infection nor to guide or monitor treatment for MRSA infections.    Studies/Results: Dg Hip Unilat With Pelvis 2-3 Views Left  06/19/2015  CLINICAL DATA:  Left hip pain. EXAM: DG HIP (WITH OR WITHOUT PELVIS) 2-3V LEFT COMPARISON:  CT of the abdomen and pelvis on 05/12/2015 FINDINGS: Mild superior joint space narrowing is consistent with osteoarthritis. No fracture, dislocation or bony lesion identified. The bony pelvis is unremarkable. IMPRESSION: Mild osteoarthritis of the left hip.  No acute fracture identified. Electronically Signed   By: Aletta Edouard M.D.   On: 06/19/2015 21:42   Medications:  I have reviewed the patient's current medications. Scheduled Meds: . ambrisentan  10 mg Oral Daily  . amiodarone  200 mg Oral Daily  . fenofibrate  54 mg Oral Daily  . FLUoxetine  20 mg Oral q morning - 10a  . [START ON 06/21/2015] levothyroxine  25 mcg Oral QAC breakfast  . nortriptyline  20 mg Oral QHS  . Selexipag  1,600 mcg Oral BID  . sodium chloride flush  3 mL Intravenous Q12H  . Tadalafil (PAH)  40 mg Oral Daily   Continuous Infusions:  PRN Meds:.acetaminophen Assessment/Plan: Active Problems:   Acute GI bleeding   Pressure ulcer  [name] is a 65 y.o. Woman with a history of angiodysplasia, scleroderma/systemic sclerosis, and prior GI bleeds who presented to the ED with one episode of rectal bleeding that occurred today and a hemoglobin of 6.2, and low  blood pressure most likely due to angiodysplasia. Today she continues to have low blood pressure, necessitating further hospitalization.    Acute GI Bleed: [name] has a known history of GI bleeding and angiodysplasia, with several prior episodes of BRBPR. Since this occurrence is similar to her prior episodes, it is likely that it is due to her angiodysplasia. Since her arrival in the ED she has not experienced further bleeding. 2 U of PRBCs given in the ED led to increase in Hb of 6.2-->7.0.  Her baseline is between 7-8, so we will transfuse one more unit. This is also indicated given her continued fatigue and lethargy.   --Admit to step down --Transfuse 1 U PRBC --Protonix 80 mg IV daily --CBC, BMP in AM   Left Hip Pain: Complains of left hip pain that interferes with walking that has been increasing since Monday. The hip is diffusely tender to palpation and pain is reproduced with pain. Differential is broad and could include osteoarthritis, avascular necrosis, trochanteric bursitis. Unlikely to be septic joint or osteomyelitis since no systemic signs of infection at this time.  Pelvic x-ray was significant for osteoarthritis of the hip.  She would likely benefit from an PT evaluation to assess her mobility needs.  --Consult PT --APAP PRN for pain  Hypotension: Pressures continue to be soft.  This AM pressure dropped to 76/48.  She was given 250 mL bolus and it increased to 90's/50's.  Due to her continued hypotension and lethargy, we will keep her an additional night in the hospital for observation.  If her pressure stays >90/50 we will transfer her to the floor.   --continue MIVF _0  ml/hr for 12 hours --will hold home amiodarone, nortriptyline, pregabalin, torsemide until pressure increases  Hypothyroidism: --continue home levothyroxine, 25 mcg daily  Pulmonary Hypertension: Secondary to systemic sclerosis. Will hold home medications at this time due to her low systemic blood  pressure.  --Hold tadalafil and ambrisentan   Type 2 DM: Diet controlled at this time.   --Carb controled diet  Dispo: Will discharge tommorw pending increase in RBC post transfusion and improvement in BP.   This is a Careers information officer Note.  The care of the patient was discussed with Dr. Lynnae January and the assessment and plan formulated with their assistance.  Please see their attached note for official documentation of the daily encounter.   LOS: 1 day   Burt Ek, Med Student 06/20/2015, 1:47 PM

## 2015-06-21 DIAGNOSIS — D62 Acute posthemorrhagic anemia: Secondary | ICD-10-CM

## 2015-06-21 DIAGNOSIS — K558 Other vascular disorders of intestine: Secondary | ICD-10-CM

## 2015-06-21 DIAGNOSIS — K921 Melena: Secondary | ICD-10-CM

## 2015-06-21 DIAGNOSIS — Q2733 Arteriovenous malformation of digestive system vessel: Secondary | ICD-10-CM

## 2015-06-21 LAB — CBC
HCT: 23.5 % — ABNORMAL LOW (ref 36.0–46.0)
HCT: 28.1 % — ABNORMAL LOW (ref 36.0–46.0)
HEMOGLOBIN: 9 g/dL — AB (ref 12.0–15.0)
Hemoglobin: 7.6 g/dL — ABNORMAL LOW (ref 12.0–15.0)
MCH: 27.1 pg (ref 26.0–34.0)
MCH: 27.2 pg (ref 26.0–34.0)
MCHC: 32.3 g/dL (ref 30.0–36.0)
MCHC: 32 g/dL (ref 30.0–36.0)
MCV: 83.9 fL (ref 78.0–100.0)
MCV: 84.9 fL (ref 78.0–100.0)
PLATELETS: 153 10*3/uL (ref 150–400)
Platelets: 156 10*3/uL (ref 150–400)
RBC: 2.8 MIL/uL — AB (ref 3.87–5.11)
RBC: 3.31 MIL/uL — ABNORMAL LOW (ref 3.87–5.11)
RDW: 15.9 % — ABNORMAL HIGH (ref 11.5–15.5)
RDW: 15.9 % — ABNORMAL HIGH (ref 11.5–15.5)
WBC: 5.7 10*3/uL (ref 4.0–10.5)
WBC: 5.1 10*3/uL (ref 4.0–10.5)

## 2015-06-21 LAB — PREPARE RBC (CROSSMATCH)

## 2015-06-21 LAB — BASIC METABOLIC PANEL
Anion gap: 9 (ref 5–15)
BUN: 50 mg/dL — ABNORMAL HIGH (ref 6–20)
CO2: 19 mmol/L — AB (ref 22–32)
CREATININE: 1.12 mg/dL — AB (ref 0.44–1.00)
Calcium: 8.1 mg/dL — ABNORMAL LOW (ref 8.9–10.3)
Chloride: 114 mmol/L — ABNORMAL HIGH (ref 101–111)
GFR calc non Af Amer: 51 mL/min — ABNORMAL LOW (ref >60)
GFR, EST AFRICAN AMERICAN: 59 mL/min — AB (ref >60)
Glucose, Bld: 95 mg/dL (ref 65–99)
Potassium: 3.6 mmol/L (ref 3.5–5.1)
Sodium: 142 mmol/L (ref 135–145)

## 2015-06-21 MED ORDER — SODIUM CHLORIDE 0.9 % IV SOLN: Freq: Once | INTRAVENOUS | Status: DC

## 2015-06-21 MED ORDER — MELATONIN 3 MG PO TABS
4.5000 mg | ORAL_TABLET | Freq: Every evening | ORAL | Status: DC | PRN
  Filled 2015-06-21: qty 1.5

## 2015-06-21 MED ORDER — SODIUM CHLORIDE 0.9 % IV BOLUS (SEPSIS)
1000.0000 mL | Freq: Once | INTRAVENOUS | Status: AC
  Administered 2015-06-21: 1000 mL via INTRAVENOUS

## 2015-06-21 MED ORDER — NON FORMULARY: 5.0000 mg | Freq: Every evening | Status: DC | PRN

## 2015-06-21 MED ORDER — SODIUM CHLORIDE 0.9 % IV SOLN
INTRAVENOUS | Status: AC
  Administered 2015-06-21: 10:00:00 via INTRAVENOUS

## 2015-06-21 MED ORDER — SODIUM CHLORIDE 0.9 % IV BOLUS (SEPSIS)
500.0000 mL | Freq: Once | INTRAVENOUS | Status: AC
  Administered 2015-06-21: 500 mL via INTRAVENOUS

## 2015-06-21 MED ORDER — ONDANSETRON HCL 4 MG PO TABS
4.0000 mg | ORAL_TABLET | Freq: Three times a day (TID) | ORAL | Status: DC | PRN
Start: 2015-06-21 — End: 2015-06-23
  Administered 2015-06-21: 4 mg via ORAL
  Filled 2015-06-21: qty 1

## 2015-06-21 MED ORDER — COLLAGENASE 250 UNIT/GM EX OINT
TOPICAL_OINTMENT | Freq: Every day | CUTANEOUS | Status: DC
  Administered 2015-06-22 – 2015-06-23 (×2): via TOPICAL
  Filled 2015-06-21: qty 30

## 2015-06-21 NOTE — Progress Notes (Signed)
Subjective: Yesterday afternoon and overnight she had additional bloody bowel movements. She received 1 unit PRBCs. Her Hgb this morning is stable at 7.6. She is more alert this morning. She denies any SOB, chest pain, or abdominal pain. She did note some abdominal pain overnight when she had the BMs.   Objective: Vital signs in last 24 hours: Filed Vitals:   06/21/15 1345 06/21/15 1400 06/21/15 1548 06/21/15 1600  BP: _0 91/47  Pulse: 58 58 96 62  Temp: 98.1 F (36.7 C)  97.7 F (36.5 C)   TempSrc: Oral  Oral   Resp: _1 Height:      Weight:      SpO2: 99% 95% 96% 98%   Weight change:   Intake/Output Summary (Last 24 hours) at 06/21/15 1807 Last data filed at 06/21/15 1547  Gross per 24 hour  Intake 4518.33 ml  Output      0 ml  Net 4518.33 ml  Physical Exam General: resting in bed, NAD HEENT: [place]/AT, EOMI, sclera anicteric, conjunctiva pale CV: RRR, no m/g/r Pulm: CTA bilaterally, breaths non-labored Abd: BS+, soft, non-tender Ext: warm, no peripheral edema Neuro: alert and oriented x 3, lethargic but easily arousable  Lab Results: Basic Metabolic Panel:  Recent Labs Lab 06/20/15 0426 06/21/15 0316  NA 143 142  K 3.6 3.6  CL 109 114*  CO2 22 19*  GLUCOSE 87 95  BUN 72* 50*  CREATININE 1.57* 1.12*  CALCIUM 8.7* 8.1*   Liver Function Tests:  Recent Labs Lab 06/19/15 1554  AST 14*  ALT 7*  ALKPHOS 84  BILITOT 0.4  PROT 6.1*  ALBUMIN 2.4*   CBC:  Recent Labs Lab 06/20/15 1643 06/20/15 2133 06/21/15 0316  WBC 6.1  --  5.7  HGB 7.0* 8.0* 7.6*  HCT 21.8* 24.1* 23.5*  MCV 83.5  --  83.9  PLT 176  --  153   CBG:  Recent Labs Lab 06/20/15 0810  GLUCAP 70   Coagulation:  Recent Labs Lab 06/19/15 2249  LABPROT 17.9*  INR 1.47   Urine Drug Screen: Drugs of Abuse     Component Value Date/Time   LABOPIA POSITIVE* 10/02/2010 1128   COCAINSCRNUR NONE DETECTED 10/02/2010 1128   LABBENZ NONE DETECTED 10/02/2010  1128   AMPHETMU NONE DETECTED 10/02/2010 1128   THCU NONE DETECTED 10/02/2010 1128   LABBARB NONE DETECTED 10/02/2010 1128     Micro Results: Recent Results (from the past 240 hour(s))  MRSA PCR Screening     Status: None   Collection Time: 06/19/15 10:30 PM  Result Value Ref Range Status   MRSA by PCR NEGATIVE NEGATIVE Final    Comment:        The GeneXpert MRSA Assay (FDA approved for NASAL specimens only), is one component of a comprehensive MRSA colonization surveillance program. It is not intended to diagnose MRSA infection nor to guide or monitor treatment for MRSA infections.    Studies/Results: Dg Hip Unilat With Pelvis 2-3 Views Left  06/19/2015  CLINICAL DATA:  Left hip pain. EXAM: DG HIP (WITH OR WITHOUT PELVIS) 2-3V LEFT COMPARISON:  CT of the abdomen and pelvis on 05/12/2015 FINDINGS: Mild superior joint space narrowing is consistent with osteoarthritis. No fracture, dislocation or bony lesion identified. The bony pelvis is unremarkable. IMPRESSION: Mild osteoarthritis of the left hip.  No acute fracture identified. Electronically Signed   By: Aletta Edouard M.D.   On: 06/19/2015 21:42   Medications: I have  reviewed the patient's current medications. Scheduled Meds: . sodium chloride   Intravenous Once  . ambrisentan  10 mg Oral Daily  . amiodarone  200 mg Oral Daily  . fenofibrate  54 mg Oral Daily  . FLUoxetine  20 mg Oral q morning - 10a  . levothyroxine  25 mcg Oral QAC breakfast  . nortriptyline  20 mg Oral QHS  . pantoprazole (PROTONIX) IV  40 mg Intravenous BID  . pregabalin  150 mg Oral TID  . Selexipag  1,600 mcg Oral BID  . sodium chloride flush  3 mL Intravenous Q12H   Continuous Infusions: . sodium chloride 100 mL/hr at 06/21/15 1019   PRN Meds:. Assessment/Plan: Acute GI Bleeding: She has a known history of GI bleeding secondary to AVMs. Hgb 6.2 on admission and now 7.6 this morning after having a total of 4 units PRBCs. She had additional  bleeding yesterday afternoon and overnight. Her BPs are in the 21H-08M systolic but this is normal for her. GI was consulted to see if she would benefit from ablation of any bleeding AVMs. She will receive an additional 1 unit PRBCs for a total of 5 units PRBCs. Goal Hgb is 8.0 with her active bleeding. Since this is a reoccurring issue, I spoke with our social worker in our clinic and plan is for her to receive palliative care services as outpatient which can include weekly blood draws to prevent her from coming into the hospital. She can get blood transfusions at our clinic. - GI consulted, appreciate recommendations- Plan for scope tomorrow  - Transfuse 1 unit PRBCs - f/u post-transfusion CBC - IV Protonix 80 mg - IVF @ 100 ml/hr to support BP  AKI on CKD Stage 3: Cr 1.84 on admission with her baseline around 1.2. Cr today improved to 1.12. Likely due to volume depletion in setting of acute GI bleeding.  - bmet in AM - Continue IVFs  Left Hip Pain: She complains of left hip pain that started about 3 days ago. Denies any trauma or falls. Plain film shows OA. PT worked with her and recommending home health PT and 3 in 1, shower chair.  - PT>> home health PT and equipment to be ordered upon discharge   Scleroderma with Pulmonary HTN: Last Echo in Feb 2017 shows PAP 76 mmHg. She had a cardiac cath in Jan 2016 which showed a PAP of 68/29 consistent with moderate pulm HTN. She follows with Dr. Lamonte Sakai as an outpatient. She had PFTs in 2004 which showed a decreased diffusion capacity of 52%. I do not see a high resolution CT scan of her chest which should be performed with her hx of scleroderma to look for ILD. This can be done as an outpatient.  - Continue home Selexipag and Ambrisentan  Chronic Diastolic CHF: Last echo in Oct 2016 with EF 57-84%, grade 2 diastolic dysfunction. She follows with Dr. Aundra Dubin as an outpatient. She is on Metolazone 2.5 mg twice weekly and Torsemide 60 mg BID. She does not  appear volume overloaded exam, especially in setting of GI bleeding. Her Cr is improving. May need to get HF team involved if her BPs/ fluid status becomes unstable in setting of GI bleeding.  - Will hold her diuretics for now as she still seems dry  Hypothyroidism: Last TSH 3.853 in Feb 2017. She takes Synthroid 25 mcg daily at home. - Continue home Synthroid   Paroxysmal AFib: On amiodarone 200 mg daily. Not on anticoagulation due to history of  GI bleeding. In NSR currently. - Continue home amiodarone   Type 2 DM: Diet controlled. Last HbA1c 5.0 in Feb 2017. - Monitor CBGs on bmet   Diet: Carb modified  VTE: SCDs Dispo: Discharge likely in 1-2 days   The patient does have a current PCP Sid Falcon, MD) and does need an [facility] hospital follow-up appointment after discharge.  The patient does not have transportation limitations that hinder transportation to clinic appointments.  .Services Needed at time of discharge: Y = Yes, Blank = No PT:   OT:   RN:   Equipment:   Other:     LOS: 2 days   Juliet Rude, MD 06/21/2015, 6:07 PM

## 2015-06-21 NOTE — Progress Notes (Signed)
Flexiseal placed per MD order at midnight.  Pt now refusing labs because she is upset about Flexiseal. Myself and charge RN attempted to educate patient on importance of labs and treatment for GI bleed. Pt continues to refuse labs. Notified MD.

## 2015-06-21 NOTE — Consult Note (Addendum)
Consultation  Referring Provider: Medicine teaching service Primary Care Physician:  Gilles Chiquito, MD Primary Gastroenterologist:   Former Dr Deatra Ina  Reason for Consultation:  GI bleeding  HPI: [name] is a 65 y.o. female who was admitted on 06/19/2015 after complaints of a bloody bowel movement which was described as dark blood and associated weakness. She was noted to be mildly hypotensive in the emergency room with a hemoglobin of 6.2. She has history of congestive heart failure, pulmonary artery hypertension, atrial fibrillation and diabetes mellitus and chronic iron deficiency anemia. She also has scleroderma, and chronic kidney disease stage III. 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 She most recently was admitted in March 2017 and underwent enteroscopy with finding of multiple small bowel AVMs, several of these were in the post bulbar and proximal jejunal area and all were APC, none were actively bleeding. She had colonoscopy done in December 2016 with finding of numerous nonbleeding AVMs most in the left colon and rectum and a few in the right colon, 10-15 of these were ablated. EGD in December 2016 was normal. Patient was transfused 2 units of packed RBCs on 06/19/2015, then 2 more units yesterday and hemoglobin this morning is 7.6. She has remained mildly hypotensive. Reviewing her records blood pressure was documented is 96/46 in March at her regular outpatient medicine visit. She is not on any blood thinners aspirin or NSAIDs. Nursing reports she was incontinent of liquid black stool since admit so Flexiseal placed-  Not much out past 12 hours but black melenic stool Pt denies pain but uncomfortable, queasy,gassy.    Past Medical History  Diagnosis Date  . Secondary pulmonary hypertension ([place])     a. 2/2 scleroderma  . GERD (gastroesophageal reflux disease)   . Systemic sclerosis ([address])   . Unspecified essential hypertension   .  Gastroparesis   . Sickle cell trait ([place])   . Obesity   . Scleroderma ([place])   . Trichomonas   . Peripheral neuropathy ([place])   . Type II diabetes mellitus ([facility])     a. diet control   . Arthritis   . Chronic kidney disease   . Gout   . Hypothyroidism     a. may be due to amiodarone use. started on synthroid on 12/2014 admission   . Anemia     a.  hx of GI bleed and duodenal AVMs  . Chronic diastolic CHF (congestive heart failure) ([place])     a. Echo 12/23/14 withEF 60-65%, moderately dilated RV, PASP 72, no pericardial effusion.  Marland Kitchen PAF (paroxysmal atrial fibrillation) (H[facility])     a. not a long term AC candidate due to GI bleeds  . Angiodysplasia of colon with hemorrhage     Past Surgical History  Procedure Laterality Date  . Tubal ligation    . Esophagogastroduodenoscopy  02/23/2010    D 2 AVM, ablated with APC.   Marland Kitchen Shoulder arthroscopy Right 12/2009    subacromial decompression  . Replacement total knee Left 11/2000  . Cardiac catheterization Right 04/24/2004  . Tonsillectomy  1960's  . Abdominal hysterectomy  1983    "partial"  . Shoulder arthroscopy w/ rotator cuff repair Right 01/2010  . Excisional total knee arthroplasty with antibiotic spacers Left 08/2010    "got infected & had to take 1st replacement out"  . Revision total knee arthroplasty Left 11/2010    "removed spacers; replaced knee"  . Total knee revision with scar debridement/patella revision with poly  exchange Left 12/2010    fell; knee split opened; had to redo revision"  . Cardiac catheterization Left 07/2010  . Peripherally inserted central catheter insertion  09/2010  . Colonoscopy  09/06/2008, 09/2013    int rrhoids:Dr. Lajoyce Corners 2010. Pan-colonic AVMs, microscopic colitis per Dr Deatra Ina 2015  . Right heart catheterization N/A 03/18/2014    Procedure: RIGHT HEART CATH;  Surgeon: Larey Dresser, MD;  Location[facility] C[facility] LAB;  Service: Cardiovascular;  Laterality: N/A;  . Esophagogastroduodenoscopy N/A 03/23/2014     Procedure: ESOPHAGOGASTRODUODENOSCOPY (EGD);  Surgeon: Ladene Artist, MD;  Location: [facility] ENDOSCOPY;  Service: Endoscopy;  Laterality: N/A;  . Pericardial tap N/A 05/03/2014    Procedure: PERICARDIAL TAP;  Surgeon: Sinclair Grooms, MD;  Location: [facility] CATH LAB;  Service: Cardiovascular;  Laterality: N/A;  . Esophagogastroduodenoscopy N/A 02/21/2015    Procedure: ESOPHAGOGASTRODUODENOSCOPY (EGD);  Surgeon: Ladene Artist, MD;  Location: [facility] ENDOSCOPY;  Service: Endoscopy;  Laterality: N/A;  . Colonoscopy N/A 02/24/2015    Procedure: COLONOSCOPY;  Surgeon: Gatha Mayer, MD;  Location: [place];  Service: Endoscopy;  Laterality: N/A;  . Enteroscopy N/A 05/14/2015    Procedure: ENTEROSCOPY;  Surgeon: Irene Shipper, MD;  Location: [place];  Service: Endoscopy;  Laterality: N/A;    Prior to Admission medications   Medication Sig Start Date End Date Taking? Authorizing Provider  ADCIRCA 20 MG TABS Take 20 mg by mouth daily. 04/28/15  Yes Historical Provider, MD  ambrisentan (LETAIRIS) 10 MG tablet Take 1 tablet (10 mg total) by mouth daily. 04/11/15  Yes Larey Dresser, MD  amiodarone (PACERONE) 200 MG tablet Take 1 tablet (200 mg total) by mouth daily. 12/17/14  Yes Larey Dresser, MD  esomeprazole (NEXIUM) 40 MG capsule Take 40 mg by mouth daily at 12 noon.    Yes Historical Provider, MD  fenofibrate 54 MG tablet Take 1 tablet (54 mg total) by mouth daily. 04/07/15  Yes Sid Falcon, MD  FLUoxetine (PROZAC) 20 MG capsule Take 1 capsule (20 mg total) by mouth every morning. 06/18/15  Yes Sid Falcon, MD  HYDROcodone-acetaminophen (NORCO) 10-325 MG tablet Take 1 tablet by mouth every 6 (six) hours as needed. pain 06/17/15  Yes Historical Provider, MD  levothyroxine (SYNTHROID, LEVOTHROID) 25 MCG tablet Take 1 tablet (25 mcg total) by mouth daily before breakfast. 12/28/14  Yes Eileen Stanford, PA-C  metolazone (ZAROXOLYN) 2.5 MG tablet Take 1 tablet (2.5 mg total) by mouth 2 (two) times a week.  (Tuesdays and Fridays) 03/27/15  Yes Larey Dresser, MD  nortriptyline (PAMELOR) 10 MG capsule Take 2 capsules (20 mg total) by mouth at bedtime. 03/19/15  Yes Dennie Bible, NP  potassium chloride SA (K-DUR,KLOR-CON) 10 MEQ tablet Take 2 tablets (20 mEq total) by mouth daily. Take additional 20 meQ with metolazone doses 03/04/15  Yes Amy D Clegg, NP  pregabalin (LYRICA) 150 MG capsule Take 1 capsule (150 mg total) by mouth 3 (three) times daily. 03/19/15  Yes Dennie Bible, NP  Selexipag (UPTRAVI) 1600 MCG TABS Take 1,600 mcg by mouth 2 (two) times daily. 10/22/14  Yes Jolaine Artist, MD  torsemide (DEMADEX) 20 MG tablet Take 40 mg by mouth 2 (two) times daily.    Yes Historical Provider, MD    Current Facility-Administered Medications  Medication Dose Route Frequency Provider Last Rate Last Dose  . 0.9 %  sodium chloride infusion   Intravenous Continuous Carly Montey Hora, MD 100 mL/hr at  06/21/15 1019    . 0.9 %  sodium chloride infusion   Intravenous Once Juliet Rude, MD      . acetaminophen (TYLENOL) tablet 650 mg  650 mg Oral Q6H PRN Juliet Rude, MD   650 mg at 06/21/15 0923  . ambrisentan (LETAIRIS) tablet 10 mg  10 mg Oral Daily Juliet Rude, MD   10 mg at 06/20/15 1115  . amiodarone (PACERONE) tablet 200 mg  200 mg Oral Daily Juliet Rude, MD   200 mg at 06/21/15 0917  . fenofibrate tablet 54 mg  54 mg Oral Daily Juliet Rude, MD   54 mg at 06/21/15 0917  . FLUoxetine (PROZAC) capsule 20 mg  20 mg Oral q morning - 10a Juliet Rude, MD   20 mg at 06/21/15 0917  . levothyroxine (SYNTHROID, LEVOTHROID) tablet 25 mcg  25 mcg Oral QAC breakfast Juliet Rude, MD   25 mcg at 06/21/15 0917  . Melatonin TABS 4.5 mg  4.5 mg Oral QHS PRN Bartholomew Crews, MD      . nortriptyline (PAMELOR) capsule 20 mg  20 mg Oral QHS Juliet Rude, MD   20 mg at 06/20/15 2248  . ondansetron (ZOFRAN) tablet 4 mg  4 mg Oral Q8H PRN Norval Gable, MD   4 mg at 06/21/15 0228  . pantoprazole  (PROTONIX) injection 40 mg  40 mg Intravenous BID Norval Gable, MD   40 mg at 06/21/15 9379  . pregabalin (LYRICA) capsule 150 mg  150 mg Oral TID Norval Gable, MD   150 mg at 06/21/15 0917  . Selexipag TABS 1,600 mcg  1,600 mcg Oral BID Juliet Rude, MD   1,600 mcg at 06/20/15 1115  . sodium chloride flush (NS) 0.9 % injection 3 mL  3 mL Intravenous Q12H Juliet Rude, MD   3 mL at 06/21/15 0918    Allergies as of 06/19/2015 - Review Complete 06/19/2015  Allergen Reaction Noted  . Cephalexin Rash 05/01/2010  . Ciprofloxacin Rash 07/23/2010  . Codeine Other (See Comments)   . Contrast media [iodinated diagnostic agents] Hives 08/03/2011  . Iohexol Hives 03/04/2008    Family History  Problem Relation Age of Onset  . Heart disease Mother   . Diabetes Mother   . Diabetes Sister     Social History   Social History  . Marital Status: Legally Separated    Spouse Name: N/A  . Number of Children: 2  . Years of Education: 11   Occupational History  . Umemployed   . Disability     Social History Main Topics  . Smoking status: Never Smoker   . Smokeless tobacco: Never Used  . Alcohol Use: No  . Drug Use: No  . Sexual Activity: No   Other Topics Concern  . Not on file   Social History Narrative    FAMILY HISTORY:  Significant for coronary artery disease and diabetes   Patient lives at home with granddaughter.    Patient has 2 children.    Patient has 11 years of education.    Patient is on disability.    Patient is right handed.    Patient is separated.      Review of Systems: Pertinent positive and negative review of systems were noted in the above HPI section.  All other review of systems was otherwise negative.  Physical Exam: Vital signs in last 24 hours: Temp:  [97.7 F (36.5  C)-98.7 F (37.1 C)] 97.8 F (36.6 C) (04/08 1015) Pulse Rate:  [35-72] 59 (04/08 1015) Resp:  [12-22] 14 (04/08 1015) BP: (76-103)/(39-55) 89/50 mmHg (04/08 1015) SpO2:  [91  %-100 %] 99 % (04/08 1015) Last BM Date: 06/20/15 General:   Alert,  Well-developed, chronically ill appearing [name] , pleasant and cooperative in NAD Head:  Normocephalic and atraumatic. Eyes:  Sclera clear, no icterus.   Conjunctiva pale  Ears:  Normal auditory acuity. Nose:  No deformity, discharge,  or lesions. Mouth:  No deformity or lesions.   Neck:  Supple; no masses or thyromegaly. Lungs:  Clear throughout to auscultation.   No wheezes, crackles, or rhonchi. Heart:  Regular rate and rhythm; soft murmur Abdomen:  Soft,nofocal tenderness, BS active,nonpalp mass or hsm.   Rectal:  Deferred-flexiseal with black stool Msk:  Symmetrical without gross deformities. . Pulses:  Normal pulses noted. Extremities:  Without clubbing or edema. Neurologic:  Alert and  oriented x4;  grossly normal neurologically. Skin:  Intact without significant lesions or rashes.. Psych:  Alert and cooperative. Normal mood and affect.  Intake/Output from previous day: 04/07 0701 - 04/08 0700 In: 4998.3 [P.O.:480; I.V.:848.3; Blood:670; IV [id number]] Out: 400 [Urine:400] Intake/Output this shift:    Lab Results:  Recent Labs  06/20/15 0426  06/20/15 1643 06/20/15 2133 06/21/15 0316  WBC 6.4  --  6.1  --  5.7  HGB 7.0*  < > 7.0* 8.0* 7.6*  HCT 20.7*  < > 21.8* 24.1* 23.5*  PLT 172  --  176  --  153  < > = values in this interval not displayed. BMET  Recent Labs  06/19/15 1554 06/20/15 0426 06/21/15 0316  NA 142 143 142  K 4.0 3.6 3.6  CL 106 109 114*  CO2 24 22 19*  GLUCOSE 123* 87 95  BUN 82* 72* 50*  CREATININE 1.84* 1.57* 1.12*  CALCIUM 8.3* 8.7* 8.1*   LFT  Recent Labs  06/19/15 1554  PROT 6.1*  ALBUMIN 2.4*  AST 14*  ALT 7*  ALKPHOS 84  BILITOT 0.4   PT/INR  Recent Labs  06/19/15 2249  LABPROT 17.9*  INR 1.47   Hepatitis Panel No results for input(s): HEPBSAG, HCVAB, HEPAIGM, HEPBIGM in the last 72 hours.   IMPRESSION:  #87 65 year old female with  multiple medical problems admitted on 06/19/2015 with an acute recurrent GI bleed and hemoglobin of 6.2. Patient to be transfused fifth unit of packed RBCs this morning with hemoglobin of 7.6. Unfortunately patient has history of multiple AVMs, both small bowel and colonic and has had recurrent problems with GI bleeding. Patient just had EGD and enteroscopy a month ago with ablation of multiple post bulbar and proximal jejunal AVMs, and had many AVMs documented on colonoscopy December 2016 at 10-15 of them ablated. #2 hypotension-secondary to above-ears patient generally runs a softer blood pressure with recent outpatient BP documented at 96/46 #3 scleroderma #4dult-onset diabetes mellitus #5 pulmonary artery hypertension #6  CHF #7 history of atrial fibrillation #8  history of chronic iron deficiency anemia secondary to above #9 sickle cell trait #10 chronic kidney disease stage III  PLAN: Difficult situation as patient has venously documented multiple AVMs and likely has many more AVMs in the small bowel  #1 clear liquid diet today and nothing by mouth after midnight #2ontinue serial hemoglobins and transfuse to keep her hemoglobin 8 #3 l schedule for EGD and enteroscopy tomorrow with Dr. Lajuan Lines discussed in detail with patient and she is  agreeable to proceed #4 Continue PPI  Amy Esterwood  06/21/2015, 10:39 AM   I have reviewed the entire case in detail with the above APP and discussed the plan in detail.  Therefore, I agree with the diagnoses recorded above. In addition,  I have personally interviewed and examined the patient and have personally reviewed any abdominal/pelvic CT scan images.  Family history:  No GI bleeding in family My additional thoughts are as follows:  Recurrent melena and ABLA.  Known SB and colon AVMs. It is not clear to me that the treated colonic or jejunal lesions in last few months have truly been the sources.  Suspect more distal SB AVMs.  Plan:   Enteroscopy tomorrow.  If no obvious source, SB video capsule study Monday.  The benefits and risks of the planned procedure were described in detail with the patient or (when appropriate) their health care proxy.  Risks were outlined as including, but not limited to, bleeding, infection, perforation, adverse medication reaction leading to cardiac or pulmonary decompensation, or pancreatitis (if ERCP).  The limitation of incomplete mucosal visualization was also discussed.  No guarantees or warranties were given.   Increased risk procedure due to medical comorbidities.  Nelida Meuse III Pager [phone]  Mon-Fri 8a-5p [phone] after 5p, weekends, holidays

## 2015-06-21 NOTE — Consult Note (Signed)
WOC wound consult note Reason for Consult: Unstageable pressure injury to right buttock.  Daughters and patient state that is is not "new" that it was present previous to this admission. Wound type:pressure Pressure Ulcer POA: Yes Measurement: 0.8cm x 2.5cm with depth obscured by the presence of a thin layer of yellow/brown soft eschar Wound bed:As above Drainage (amount, consistency, odor) Small amount of light yellow/tan exudate (consistent with autolytic debridement) on old foam dressing Periwound:Intact Dressing procedure/placement/frequency: Soft silicone foam dressing is facilitating autolysis, but it is a rather slow process when using this modality.  I will change to enzymatic debriding agent (collagenase/Santyl) tomorrow in hopes that we can expedite the debridement of necrotic tissue and move into tissue repair and regeneration.  A pressure redistribution chair pad is provided for her use when OOB in chair. [place] nursing team will not follow, but will remain available to this patient, the nursing and medical teams.  Please re-consult if needed. Thanks, Maudie Flakes, MSN, RN, [place], Arther Abbott  Pager# [phone]

## 2015-06-21 NOTE — Progress Notes (Signed)
Subjective: Had more GI bleeding yesterday afternoon afternoon including BRBPR and melena.  Continued through the night.  Was given 1 additional unit PRBCs. Was refu sing labs due to fecal management system that was added per nursing request.  Asked to have it removed this morning again.   This morning she was awake and answering questions and interacting well.  She expressed how down she was about her hard night last night, and embarrassment over bleeding while she was working with PT.  Has a good understanding of her disease and why she is having recurrent GI bleeding.  Enjoyed working with PT yesterday and wants to work with them again so that she can get up and walk around.  Said the one thing she wants for the day is for the FMS to be removed.  She understands why she has to stay in the hospital another day.    Objective: Vital signs in last 24 hours: Filed Vitals:   06/21/15 0738 06/21/15 0800 06/21/15 1000 06/21/15 1015  BP:  _0  Pulse:  49 57 59  Temp: 98.3 F (36.8 C)   97.8 F (36.6 C)  TempSrc: Oral   Oral  Resp:  _1 Height:      Weight:      SpO2:  98% 95% 99%   Weight change:   Intake/Output Summary (Last 24 hours) at 06/21/15 1108 Last data filed at 06/21/15 0512  Gross per 24 hour  Intake 4423.33 ml  Output      0 ml  Net 4423.33 ml   BP 89/50 mmHg  Pulse 59  Temp(Src) 97.8 F (36.6 C) (Oral)  Resp 14  Ht _2  (1.626 m)  Wt 64.864 kg (143 lb)  BMI 24.53 kg/m2  SpO2 99% General appearance: alert, mild distress and conversant today.  Sitting up in bed and able to stay awake throughout duration of interview. Lungs: clear to auscultation bilaterally Heart: regular rate and rhythm and S1, S2 normal Abdomen: soft, non-tender; bowel sounds normal; no masses,  no organomegaly Extremities: extremities normal, atraumatic, no cyanosis or edema Neurologic: Grossly normal Lab Results: Basic Metabolic Panel:  Recent Labs Lab 06/20/15 0426  06/21/15 0316  NA 143 142  K 3.6 3.6  CL 109 114*  CO2 22 19*  GLUCOSE 87 95  BUN 72* 50*  CREATININE 1.57* 1.12*  CALCIUM 8.7* 8.1*    Recent Labs Lab 06/20/15 1643 06/20/15 2133 06/21/15 0316  WBC 6.1  --  5.7  HGB 7.0* 8.0* 7.6*  HCT 21.8* 24.1* 23.5*  MCV 83.5  --  83.9  PLT 176  --  153    Recent Labs Lab 06/20/15 0810  GLUCAP 70    Medications: I have reviewed the patient's current medications. Scheduled Meds: . sodium chloride   Intravenous Once  . ambrisentan  10 mg Oral Daily  . amiodarone  200 mg Oral Daily  . fenofibrate  54 mg Oral Daily  . FLUoxetine  20 mg Oral q morning - 10a  . levothyroxine  25 mcg Oral QAC breakfast  . nortriptyline  20 mg Oral QHS  . pantoprazole (PROTONIX) IV  40 mg Intravenous BID  . pregabalin  150 mg Oral TID  . Selexipag  1,600 mcg Oral BID  . sodium chloride flush  3 mL Intravenous Q12H   Continuous Infusions: . sodium chloride 100 mL/hr at 06/21/15 1019   PRN Meds:.acetaminophen, Melatonin, ondansetron Assessment/Plan: Active Problems:   Acute GI bleeding  Pressure ulcer  [name] is a 65 y.o. Woman with a history of angiodysplasia, scleroderma/systemic sclerosis, and prior GI bleeds who presented to the ED with one episode of rectal bleeding that occurred today and a hemoglobin of 6.2, and low blood pressure most likely due to angiodysplasia.   Acute GI Bleed: Pamela Santizo has a known history of GI bleeding and angiodysplasia, with several prior episodes of BRBPR. Since this occurrence is similar to her prior episodes, it is likely that it is due to her angiodysplasia. New GI bleeding is worrisome for new bleeds or continued bleeding from same place.  Hb down to 7.6 this AM after 1 U infused overnight.  Given her continued bleeding at this time, we will consult GI for possible definitive treatment of bleeding locus/loci.  Was given a FMS o/n due to concern over pressure ulcer in vicinity of anus.  Patient is  uncomfortable and there is not a good indication for an FMS at this time.  We will have WOCN come and see patient to recommend dressing for the pressure ulcer to keep it clean.  --D/C fecal management system --transfuse 1 U PRBC (5U total have been transfused as of 4/8) --GI will see today and evaluate for possible scope and ablation --Protonix 80 mg IV daily --Continue to follow Hb/Hct  Left Hip Pain: Complains of left hip pain that interferes with walking that has been increasing since Monday.Worked with PT yesterday.  They recommended home health, shower chair.  --PT following --APAP PRN for pain  Hypotension: Continues to have pressures in the 80's/40's but stable for now.  Possibly could be volume down given decreased intake and loss of fluids with melena/diarrhea.  She has diastolic heart failure so could be somewhat preload dependent.  Will continue to judiciously give fluids being careful not to overload her.  Will continue to hold home diuretics.   --continue MIVF _0  ml/hr for 12 hours --Bolus 500 mL NS --will hold home torsemide  Pressure Ulcer: Currently unstaged.   --Consulted WOCN  Bradycardia: ECG showed 1st degree AV block.  Has now resolved. --continue to monitor for hemodynamic instability due to bradycardia  Paroxysmal Afib: --restart home amiodarone  Hypothyroidism: --continue home levothyroxine, 25 mcg daily  Pulmonary Hypertension: Secondary to systemic sclerosis.Given her continued hospital stay we will restart her home medications. --Restart ambrisentan and tadalafil  Diastolic heart failure: No current signs of fluid overload.  Continue to hold torsemide given low BPs.    Dispo: Will discharge after 1 day pending increase in RBC post transfusion and improvement in BP.   This is a Careers information officer Note.  The care of the patient was discussed with Dr. Learta Codding and the assessment and plan formulated with their assistance.  Please see their attached  note for official documentation of the daily encounter.   LOS: 2 days   Burt Ek, Med Student 06/21/2015, 11:08 AM

## 2015-06-21 NOTE — Progress Notes (Signed)
MD notified of pt's BP 76/41. Per MD, pt likely needs another transfusion, but cannot order until labwork results. This RN spoke with patient who agrees to have labwork drawn. Received orders for 1L bolus, lab orders moved to now. Will continue to monitor pt closely.

## 2015-06-21 NOTE — Progress Notes (Signed)
Pt hemoglobin 7.6, BP has improved to 88/49. MD aware, no new orders received at this time. Will continue to monitor.

## 2015-06-22 ENCOUNTER — Encounter (HOSPITAL_COMMUNITY): Payer: Self-pay | Admitting: *Deleted

## 2015-06-22 ENCOUNTER — Encounter (HOSPITAL_COMMUNITY)
Admission: EM | Disposition: A | Discharge status: Home Health | Payer: Self-pay | Source: Home / Self Care | Attending: Internal Medicine

## 2015-06-22 DIAGNOSIS — N183 Chronic kidney disease, stage 3 (moderate): Secondary | ICD-10-CM

## 2015-06-22 DIAGNOSIS — I5032 Chronic diastolic (congestive) heart failure: Secondary | ICD-10-CM

## 2015-06-22 DIAGNOSIS — K31811 Angiodysplasia of stomach and duodenum with bleeding: Secondary | ICD-10-CM | POA: Insufficient documentation

## 2015-06-22 DIAGNOSIS — N179 Acute kidney failure, unspecified: Secondary | ICD-10-CM

## 2015-06-22 DIAGNOSIS — E1122 Type 2 diabetes mellitus with diabetic chronic kidney disease: Secondary | ICD-10-CM

## 2015-06-22 DIAGNOSIS — K5521 Angiodysplasia of colon with hemorrhage: Secondary | ICD-10-CM | POA: Insufficient documentation

## 2015-06-22 DIAGNOSIS — I272 Other secondary pulmonary hypertension: Secondary | ICD-10-CM

## 2015-06-22 DIAGNOSIS — K922 Gastrointestinal hemorrhage, unspecified: Secondary | ICD-10-CM

## 2015-06-22 DIAGNOSIS — I480 Paroxysmal atrial fibrillation: Secondary | ICD-10-CM

## 2015-06-22 DIAGNOSIS — M3481 Systemic sclerosis with lung involvement: Secondary | ICD-10-CM

## 2015-06-22 DIAGNOSIS — M25552 Pain in left hip: Secondary | ICD-10-CM

## 2015-06-22 DIAGNOSIS — Q2730 Arteriovenous malformation, site unspecified: Secondary | ICD-10-CM

## 2015-06-22 DIAGNOSIS — D649 Anemia, unspecified: Secondary | ICD-10-CM | POA: Insufficient documentation

## 2015-06-22 DIAGNOSIS — E039 Hypothyroidism, unspecified: Secondary | ICD-10-CM

## 2015-06-22 HISTORY — PX: ENTEROSCOPY: SHX5533

## 2015-06-22 HISTORY — PX: HOT HEMOSTASIS: SHX5433

## 2015-06-22 LAB — CBC
HEMATOCRIT: 25.3 % — AB (ref 36.0–46.0)
HEMOGLOBIN: 8.4 g/dL — AB (ref 12.0–15.0)
MCH: 28 pg (ref 26.0–34.0)
MCHC: 33.2 g/dL (ref 30.0–36.0)
MCV: 84.3 fL (ref 78.0–100.0)
PLATELETS: 114 10*3/uL — AB (ref 150–400)
RBC: 3 MIL/uL — AB (ref 3.87–5.11)
RDW: 16.1 % — ABNORMAL HIGH (ref 11.5–15.5)
WBC: 4.5 10*3/uL (ref 4.0–10.5)

## 2015-06-22 SURGERY — ENTEROSCOPY
Anesthesia: Moderate Sedation

## 2015-06-22 MED ORDER — BUTAMBEN-TETRACAINE-BENZOCAINE 2-2-14 % EX AERO
INHALATION_SPRAY | CUTANEOUS | Status: DC | PRN
  Administered 2015-06-22: 1 via TOPICAL

## 2015-06-22 MED ORDER — DIPHENHYDRAMINE HCL 50 MG/ML IJ SOLN
INTRAMUSCULAR | Status: AC
  Filled 2015-06-22: qty 1

## 2015-06-22 MED ORDER — FENTANYL CITRATE (PF) 100 MCG/2ML IJ SOLN
INTRAMUSCULAR | Status: DC | PRN
  Administered 2015-06-22: 25 ug via INTRAVENOUS

## 2015-06-22 MED ORDER — SODIUM CHLORIDE 0.9 % IV SOLN
INTRAVENOUS | Status: AC
  Administered 2015-06-22: 02:00:00 via INTRAVENOUS

## 2015-06-22 MED ORDER — MIDAZOLAM HCL 10 MG/2ML IJ SOLN
INTRAMUSCULAR | Status: DC | PRN
  Administered 2015-06-22 (×3): 1 mg via INTRAVENOUS

## 2015-06-22 MED ORDER — FAMOTIDINE 20 MG PO TABS
20.0000 mg | ORAL_TABLET | Freq: Every day | ORAL | Status: DC
  Administered 2015-06-23: 20 mg via ORAL
  Filled 2015-06-22: qty 1

## 2015-06-22 MED ORDER — MIDAZOLAM HCL 5 MG/ML IJ SOLN
INTRAMUSCULAR | Status: AC
Start: 2015-06-22 — End: 2015-06-22
  Filled 2015-06-22: qty 2

## 2015-06-22 MED ORDER — FENTANYL CITRATE (PF) 100 MCG/2ML IJ SOLN
INTRAMUSCULAR | Status: AC
Start: 2015-06-22 — End: 2015-06-22
  Filled 2015-06-22: qty 4

## 2015-06-22 NOTE — Interval H&P Note (Signed)
History and Physical Interval Note:  06/22/2015 2:54 PM  [name]  has presented today for surgery, with the diagnosis of GI bleed  The various methods of treatment have been discussed with the patient and family. After consideration of risks, benefits and other options for treatment, the patient has consented to  Procedure(s): ENTEROSCOPY (N/A) as a surgical intervention .  The patient's history has been reviewed, patient examined, no change in status, stable for surgery.  I have reviewed the patient's chart and labs.  Questions were answered to the patient's satisfaction.     Nelida Meuse III

## 2015-06-22 NOTE — Progress Notes (Signed)
  Date: 06/22/2015  Patient name: [name]  Medical record number: [id number]  Date of birth: 07-30-1950   This patient has been seen and the plan of care was discussed with the house staff. Please see their note for complete details. I concur with their findings.  Sid Falcon, MD 06/22/2015, 8:43 PM

## 2015-06-22 NOTE — Op Note (Signed)
[facility] Patient Name: [name] Procedure Date : 06/22/2015 MRN: [id number] Attending MD: Estill Cotta. 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 Loletha Carrow, MD, Kingsley Plan, RN, Cherylynn Ridges, Technician Referring MD:              Medicines:                Midazolam 3 mg IV, Fentanyl 25 micrograms IV,                            Cetacaine spray Complications:            No immediate complications. Estimated Blood Loss:     Estimated blood loss: none. Procedure:                Pre-Anesthesia Assessment:                           - Prior to the procedure, a History and Physical                            was performed, and patient medications and                            allergies were reviewed. The patient's tolerance of                            previous anesthesia was also reviewed. The risks                            and benefits of the procedure and the sedation                            options and risks were discussed with the patient.                            All questions were answered, and informed consent                            was obtained. Prior Anticoagulants: The patient has                            taken no previous anticoagulant or antiplatelet  agents. ASA Grade Assessment: III - A patient with                            severe systemic disease. After reviewing the risks                            and benefits, the patient was deemed in                            satisfactory condition to undergo the procedure.                           After obtaining informed consent, the pediatric                            colonoscope was  passed under direct vision.                            Throughout the procedure, the patient's blood                            pressure, pulse, and oxygen saturations were                            monitored continuously. The JK-[id number] ([id number])                            scope was introduced through the and advanced to                            the jejunum (110 cm of reduced scope). The upper GI                            endoscopy was accomplished without difficulty. The                            patient tolerated the procedure. Scope In: Scope Out: Findings:      The esophagus was normal.      The stomach was normal.      The examined duodenum was normal.      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 APC To prevent bleeding       post-intervention, one hemostatic clip was successfully placed. There       was no bleeding at the end of the procedure. Impression:               - Normal esophagus.                           - Normal stomach.                           - Normal examined duodenum.                           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 Clip was placed.                           - No specimens collected. Moderate Sedation:      Moderate (conscious) sedation was administered by the endoscopy nurse       and supervised by the endoscopist. The following parameters were       monitored: oxygen saturation, heart rate, blood pressure, respiratory       rate, EKG, adequacy of pulmonary ventilation, and response to care. Recommendation:           - Diabetic (ADA) diet.                           - Continue present medications.                           Patient should have a small bowel video capsule                            study, preferably outpatient since there is a clip                            in the small bowel at present.                            Monitor in hospital for further blood loss with                            serial hgb/hct Procedure Code(s):        --- Professional ---                           [phone], Small intestinal endoscopy, enteroscopy                            beyond second portion of duodenum, not including                            ileum; with control of bleeding (eg, injection,                            bipolar cautery, unipolar cautery, laser, heater                            probe, stapler, plasma coagulator) Diagnosis Code(s):        --- Professional ---                           D62, Acute posthemorrhagic anemia                           K92.1, Melena (includes Hematochezia) CPT copyright 2016 [employer]. All rights reserved. The codes  documented in this report are preliminary and upon coder review may  be revised to meet current compliance requirements. Henry L. Danis MD, MD 06/22/2015 4:37:54 PM This report has been signed electronically. Number of Addenda: 0

## 2015-06-22 NOTE — Op Note (Signed)
Full enteroscopy note to follow.  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  Not actively bleeding.  Ablated with APC  -> excellent effect.  Single hemoclip placed on the site to aid healing.  It is not clear if this single lesion accounts for the recent overt GI bleeding and anemia.  Patient should have a small bowel video capsule, preferably outpatient since there is currently a clip in the small bowel.  Check CBC in AM OK to eat

## 2015-06-22 NOTE — Progress Notes (Signed)
Subjective: She reports she had another bloody bowel movement last night. She reports some abdominal pain. She knows that she will undergo enteroscopy today.   Objective: Vital signs in last 24 hours: Filed Vitals:   06/21/15 2335 06/22/15 0450 06/22/15 0727 06/22/15 1151  BP: _0 92/57  Pulse: 55 54  50  Temp: 98.7 F (37.1 C) 98.4 F (36.9 C) 97.3 F (36.3 C) 97.5 F (36.4 C)  TempSrc: Oral Oral Oral Oral  Resp: _1 Height:      Weight:      SpO2: 99% 100%  97%   Weight change:   Intake/Output Summary (Last 24 hours) at 06/22/15 1227 Last data filed at 06/22/15 1200  Gross per 24 hour  Intake 2356.67 ml  Output    600 ml  Net 1756.67 ml  Physical Exam General: resting in bed, NAD HEENT: [place]/AT, EOMI, sclera anicteric, conjunctiva pale CV: RRR, no m/g/r Pulm: CTA bilaterally, breaths non-labored Abd: BS+, soft, mild diffuse tenderness Ext: warm, no peripheral edema Neuro: alert and oriented x 3  Lab Results: Basic Metabolic Panel:  Recent Labs Lab 06/20/15 0426 06/21/15 0316  NA 143 142  K 3.6 3.6  CL 109 114*  CO2 22 19*  GLUCOSE 87 95  BUN 72* 50*  CREATININE 1.57* 1.12*  CALCIUM 8.7* 8.1*   Liver Function Tests:  Recent Labs Lab 06/19/15 1554  AST 14*  ALT 7*  ALKPHOS 84  BILITOT 0.4  PROT 6.1*  ALBUMIN 2.4*   CBC:  Recent Labs Lab 06/21/15 1822 06/22/15 0304  WBC 5.1 4.5  HGB 9.0* 8.4*  HCT 28.1* 25.3*  MCV 84.9 84.3  PLT 156 114*   CBG:  Recent Labs Lab 06/20/15 0810  GLUCAP 70   Coagulation:  Recent Labs Lab 06/19/15 2249  LABPROT 17.9*  INR 1.47   Urine Drug Screen: Drugs of Abuse     Component Value Date/Time   LABOPIA POSITIVE* 10/02/2010 1128   COCAINSCRNUR NONE DETECTED 10/02/2010 1128   LABBENZ NONE DETECTED 10/02/2010 1128   AMPHETMU NONE DETECTED 10/02/2010 1128   THCU NONE DETECTED 10/02/2010 1128   LABBARB NONE DETECTED 10/02/2010 1128     Micro Results: Recent Results  (from the past 240 hour(s))  MRSA PCR Screening     Status: None   Collection Time: 06/19/15 10:30 PM  Result Value Ref Range Status   MRSA by PCR NEGATIVE NEGATIVE Final    Comment:        The GeneXpert MRSA Assay (FDA approved for NASAL specimens only), is one component of a comprehensive MRSA colonization surveillance program. It is not intended to diagnose MRSA infection nor to guide or monitor treatment for MRSA infections.    Studies/Results: No results found. Medications: I have reviewed the patient's current medications. Scheduled Meds: . sodium chloride   Intravenous Once  . ambrisentan  10 mg Oral Daily  . amiodarone  200 mg Oral Daily  . collagenase   Topical Daily  . fenofibrate  54 mg Oral Daily  . FLUoxetine  20 mg Oral q morning - 10a  . levothyroxine  25 mcg Oral QAC breakfast  . nortriptyline  20 mg Oral QHS  . pantoprazole (PROTONIX) IV  40 mg Intravenous BID  . pregabalin  150 mg Oral TID  . Selexipag  1,600 mcg Oral BID  . sodium chloride flush  3 mL Intravenous Q12H   Continuous Infusions: . sodium chloride 100 mL/hr at 06/22/15 1200  PRN Meds:. Assessment/Plan: Acute GI Bleeding: She has a known history of GI bleeding secondary to AVMs. Hgb stable at 8.4 this morning. She has received a total of 5 units PRBCs. Goal Hgb > 8.0 with her active bleeding. BPs stable in 45O systolic. She will undergo enteroscopy with GI today.  - GI consulted, appreciate recommendations - f/u enteroscopy findings - IV Protonix 80 mg - IVF @ 100 ml/hr to support BP  AKI on CKD Stage 3: Cr 1.84 on admission with her baseline around 1.2. Cr improved to 1.12. Likely had AKI due to volume depletion in setting of acute GI bleeding.  - Continue IVFs to support BPs  Left Hip Pain: She complains of left hip pain that started about 3 days ago. Denies any trauma or falls. Plain film shows OA. PT worked with her and recommending home health PT and 3 in 1, shower chair.  - PT>>  home health PT and equipment to be ordered upon discharge   Scleroderma with Pulmonary HTN: Last Echo in Feb 2017 shows PAP 76 mmHg. She had a cardiac cath in Jan 2016 which showed a PAP of 68/29 consistent with moderate pulm HTN. She follows with Dr. Lamonte Sakai as an outpatient. She had PFTs in 2004 which showed a decreased diffusion capacity of 52%. I do not see a high resolution CT scan of her chest which should be performed with her hx of scleroderma to look for ILD. This can be done as an outpatient.  - Continue home Selexipag and Ambrisentan  Chronic Diastolic CHF: Last echo in Oct 2016 with EF 59-29%, grade 2 diastolic dysfunction. She follows with Dr. Aundra Dubin as an outpatient. She is on Metolazone 2.5 mg twice weekly and Torsemide 60 mg BID. She does not appear volume overloaded exam, especially in setting of GI bleeding. Her Cr is improving. May need to get HF team involved if her BPs/ fluid status becomes unstable in setting of GI bleeding.  - Will hold her diuretics for now as she still seems dry  Hypothyroidism: Last TSH 3.853 in Feb 2017. She takes Synthroid 25 mcg daily at home. - Continue home Synthroid   Paroxysmal AFib: On amiodarone 200 mg daily. Not on anticoagulation due to history of GI bleeding. In NSR currently. - Continue home amiodarone   Type 2 DM: Diet controlled. Last HbA1c 5.0 in Feb 2017. - Monitor CBGs on bmet   Diet: Carb modified  VTE: SCDs Dispo: Discharge likely in 1-2 days   The patient does have a current PCP Sid Falcon, MD) and does need an [facility] hospital follow-up appointment after discharge.  The patient does not have transportation limitations that hinder transportation to clinic appointments.  .Services Needed at time of discharge: Y = Yes, Blank = No PT:   OT:   RN:   Equipment:   Other:     LOS: 3 days   Juliet Rude, MD 06/22/2015, 12:27 PM

## 2015-06-22 NOTE — H&P (View-Only) (Signed)
Consultation  Referring Provider: Medicine teaching service Primary Care Physician:  Gilles Chiquito, MD Primary Gastroenterologist:   Former Dr Deatra Ina  Reason for Consultation:  GI bleeding  HPI: [name] is a 65 y.o. female who was admitted on 06/19/2015 after complaints of a bloody bowel movement which was described as dark blood and associated weakness. She was noted to be mildly hypotensive in the emergency room with a hemoglobin of 6.2. She has history of congestive heart failure, pulmonary artery hypertension, atrial fibrillation and diabetes mellitus and chronic iron deficiency anemia. She also has scleroderma, and chronic kidney disease stage III. 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 She most recently was admitted in March 2017 and underwent enteroscopy with finding of multiple small bowel AVMs, several of these were in the post bulbar and proximal jejunal area and all were APC, none were actively bleeding. She had colonoscopy done in December 2016 with finding of numerous nonbleeding AVMs most in the left colon and rectum and a few in the right colon, 10-15 of these were ablated. EGD in December 2016 was normal. Patient was transfused 2 units of packed RBCs on 06/19/2015, then 2 more units yesterday and hemoglobin this morning is 7.6. She has remained mildly hypotensive. Reviewing her records blood pressure was documented is 96/46 in March at her regular outpatient medicine visit. She is not on any blood thinners aspirin or NSAIDs. Nursing reports she was incontinent of liquid black stool since admit so Flexiseal placed-  Not much out past 12 hours but black melenic stool Pt denies pain but uncomfortable, queasy,gassy.    Past Medical History  Diagnosis Date  . Secondary pulmonary hypertension ([place])     a. 2/2 scleroderma  . GERD (gastroesophageal reflux disease)   . Systemic sclerosis ([place])   . Unspecified essential hypertension   .  Gastroparesis   . Sickle cell trait ([place])   . Obesity   . Scleroderma ([place])   . Trichomonas   . Peripheral neuropathy ([place])   . Type II diabetes mellitus ([facility])     a. diet control   . Arthritis   . Chronic kidney disease   . Gout   . Hypothyroidism     a. may be due to amiodarone use. started on synthroid on 12/2014 admission   . Anemia     a.  hx of GI bleed and duodenal AVMs  . Chronic diastolic CHF (congestive heart failure) ([place])     a. Echo 12/23/14 withEF 60-65%, moderately dilated RV, PASP 72, no pericardial effusion.  Marland Kitchen PAF (paroxysmal atrial fibrillation) (H[facility])     a. not a long term AC candidate due to GI bleeds  . Angiodysplasia of colon with hemorrhage     Past Surgical History  Procedure Laterality Date  . Tubal ligation    . Esophagogastroduodenoscopy  02/23/2010    D 2 AVM, ablated with APC.   Marland Kitchen Shoulder arthroscopy Right 12/2009    subacromial decompression  . Replacement total knee Left 11/2000  . Cardiac catheterization Right 04/24/2004  . Tonsillectomy  1960's  . Abdominal hysterectomy  1983    "partial"  . Shoulder arthroscopy w/ rotator cuff repair Right 01/2010  . Excisional total knee arthroplasty with antibiotic spacers Left 08/2010    "got infected & had to take 1st replacement out"  . Revision total knee arthroplasty Left 11/2010    "removed spacers; replaced knee"  . Total knee revision with scar debridement/patella revision with poly  exchange Left 12/2010    fell; knee split opened; had to redo revision"  . Cardiac catheterization Left 07/2010  . Peripherally inserted central catheter insertion  09/2010  . Colonoscopy  09/06/2008, 09/2013    int rrhoids:Dr. Lajoyce Corners 2010. Pan-colonic AVMs, microscopic colitis per Dr Deatra Ina 2015  . Right heart catheterization N/A 03/18/2014    Procedure: RIGHT HEART CATH;  Surgeon: Larey Dresser, MD;  Location[facility] C[facility] LAB;  Service: Cardiovascular;  Laterality: N/A;  . Esophagogastroduodenoscopy N/A 03/23/2014     Procedure: ESOPHAGOGASTRODUODENOSCOPY (EGD);  Surgeon: Ladene Artist, MD;  Location: [facility] ENDOSCOPY;  Service: Endoscopy;  Laterality: N/A;  . Pericardial tap N/A 05/03/2014    Procedure: PERICARDIAL TAP;  Surgeon: Sinclair Grooms, MD;  Location: [facility] CATH LAB;  Service: Cardiovascular;  Laterality: N/A;  . Esophagogastroduodenoscopy N/A 02/21/2015    Procedure: ESOPHAGOGASTRODUODENOSCOPY (EGD);  Surgeon: Ladene Artist, MD;  Location: [facility] ENDOSCOPY;  Service: Endoscopy;  Laterality: N/A;  . Colonoscopy N/A 02/24/2015    Procedure: COLONOSCOPY;  Surgeon: Gatha Mayer, MD;  Location: [place];  Service: Endoscopy;  Laterality: N/A;  . Enteroscopy N/A 05/14/2015    Procedure: ENTEROSCOPY;  Surgeon: Irene Shipper, MD;  Location: [place];  Service: Endoscopy;  Laterality: N/A;    Prior to Admission medications   Medication Sig Start Date End Date Taking? Authorizing Provider  ADCIRCA 20 MG TABS Take 20 mg by mouth daily. 04/28/15  Yes Historical Provider, MD  ambrisentan (LETAIRIS) 10 MG tablet Take 1 tablet (10 mg total) by mouth daily. 04/11/15  Yes Larey Dresser, MD  amiodarone (PACERONE) 200 MG tablet Take 1 tablet (200 mg total) by mouth daily. 12/17/14  Yes Larey Dresser, MD  esomeprazole (NEXIUM) 40 MG capsule Take 40 mg by mouth daily at 12 noon.    Yes Historical Provider, MD  fenofibrate 54 MG tablet Take 1 tablet (54 mg total) by mouth daily. 04/07/15  Yes Sid Falcon, MD  FLUoxetine (PROZAC) 20 MG capsule Take 1 capsule (20 mg total) by mouth every morning. 06/18/15  Yes Sid Falcon, MD  HYDROcodone-acetaminophen (NORCO) 10-325 MG tablet Take 1 tablet by mouth every 6 (six) hours as needed. pain 06/17/15  Yes Historical Provider, MD  levothyroxine (SYNTHROID, LEVOTHROID) 25 MCG tablet Take 1 tablet (25 mcg total) by mouth daily before breakfast. 12/28/14  Yes Eileen Stanford, PA-C  metolazone (ZAROXOLYN) 2.5 MG tablet Take 1 tablet (2.5 mg total) by mouth 2 (two) times a week.  (Tuesdays and Fridays) 03/27/15  Yes Larey Dresser, MD  nortriptyline (PAMELOR) 10 MG capsule Take 2 capsules (20 mg total) by mouth at bedtime. 03/19/15  Yes Dennie Bible, NP  potassium chloride SA (K-DUR,KLOR-CON) 10 MEQ tablet Take 2 tablets (20 mEq total) by mouth daily. Take additional 20 meQ with metolazone doses 03/04/15  Yes Amy D Clegg, NP  pregabalin (LYRICA) 150 MG capsule Take 1 capsule (150 mg total) by mouth 3 (three) times daily. 03/19/15  Yes Dennie Bible, NP  Selexipag (UPTRAVI) 1600 MCG TABS Take 1,600 mcg by mouth 2 (two) times daily. 10/22/14  Yes Jolaine Artist, MD  torsemide (DEMADEX) 20 MG tablet Take 40 mg by mouth 2 (two) times daily.    Yes Historical Provider, MD    Current Facility-Administered Medications  Medication Dose Route Frequency Provider Last Rate Last Dose  . 0.9 %  sodium chloride infusion   Intravenous Continuous Carly Montey Hora, MD 100 mL/hr at  06/21/15 1019    . 0.9 %  sodium chloride infusion   Intravenous Once Juliet Rude, MD      . acetaminophen (TYLENOL) tablet 650 mg  650 mg Oral Q6H PRN Juliet Rude, MD   650 mg at 06/21/15 0923  . ambrisentan (LETAIRIS) tablet 10 mg  10 mg Oral Daily Juliet Rude, MD   10 mg at 06/20/15 1115  . amiodarone (PACERONE) tablet 200 mg  200 mg Oral Daily Juliet Rude, MD   200 mg at 06/21/15 0917  . fenofibrate tablet 54 mg  54 mg Oral Daily Juliet Rude, MD   54 mg at 06/21/15 0917  . FLUoxetine (PROZAC) capsule 20 mg  20 mg Oral q morning - 10a Juliet Rude, MD   20 mg at 06/21/15 0917  . levothyroxine (SYNTHROID, LEVOTHROID) tablet 25 mcg  25 mcg Oral QAC breakfast Juliet Rude, MD   25 mcg at 06/21/15 0917  . Melatonin TABS 4.5 mg  4.5 mg Oral QHS PRN Bartholomew Crews, MD      . nortriptyline (PAMELOR) capsule 20 mg  20 mg Oral QHS Juliet Rude, MD   20 mg at 06/20/15 2248  . ondansetron (ZOFRAN) tablet 4 mg  4 mg Oral Q8H PRN Norval Gable, MD   4 mg at 06/21/15 0228  . pantoprazole  (PROTONIX) injection 40 mg  40 mg Intravenous BID Norval Gable, MD   40 mg at 06/21/15 9379  . pregabalin (LYRICA) capsule 150 mg  150 mg Oral TID Norval Gable, MD   150 mg at 06/21/15 0917  . Selexipag TABS 1,600 mcg  1,600 mcg Oral BID Juliet Rude, MD   1,600 mcg at 06/20/15 1115  . sodium chloride flush (NS) 0.9 % injection 3 mL  3 mL Intravenous Q12H Juliet Rude, MD   3 mL at 06/21/15 0918    Allergies as of 06/19/2015 - Review Complete 06/19/2015  Allergen Reaction Noted  . Cephalexin Rash 05/01/2010  . Ciprofloxacin Rash 07/23/2010  . Codeine Other (See Comments)   . Contrast media [iodinated diagnostic agents] Hives 08/03/2011  . Iohexol Hives 03/04/2008    Family History  Problem Relation Age of Onset  . Heart disease Mother   . Diabetes Mother   . Diabetes Sister     Social History   Social History  . Marital Status: Legally Separated    Spouse Name: N/A  . Number of Children: 2  . Years of Education: 11   Occupational History  . Umemployed   . Disability     Social History Main Topics  . Smoking status: Never Smoker   . Smokeless tobacco: Never Used  . Alcohol Use: No  . Drug Use: No  . Sexual Activity: No   Other Topics Concern  . Not on file   Social History Narrative    FAMILY HISTORY:  Significant for coronary artery disease and diabetes   Patient lives at home with granddaughter.    Patient has 2 children.    Patient has 11 years of education.    Patient is on disability.    Patient is right handed.    Patient is separated.      Review of Systems: Pertinent positive and negative review of systems were noted in the above HPI section.  All other review of systems was otherwise negative.  Physical Exam: Vital signs in last 24 hours: Temp:  [97.7 F (36.5  C)-98.7 F (37.1 C)] 97.8 F (36.6 C) (04/08 1015) Pulse Rate:  [35-72] 59 (04/08 1015) Resp:  [12-22] 14 (04/08 1015) BP: (76-103)/(39-55) 89/50 mmHg (04/08 1015) SpO2:  [91  %-100 %] 99 % (04/08 1015) Last BM Date: 06/20/15 General:   Alert,  Well-developed, chronically ill appearing [name] , pleasant and cooperative in NAD Head:  Normocephalic and atraumatic. Eyes:  Sclera clear, no icterus.   Conjunctiva pale  Ears:  Normal auditory acuity. Nose:  No deformity, discharge,  or lesions. Mouth:  No deformity or lesions.   Neck:  Supple; no masses or thyromegaly. Lungs:  Clear throughout to auscultation.   No wheezes, crackles, or rhonchi. Heart:  Regular rate and rhythm; soft murmur Abdomen:  Soft,nofocal tenderness, BS active,nonpalp mass or hsm.   Rectal:  Deferred-flexiseal with black stool Msk:  Symmetrical without gross deformities. . Pulses:  Normal pulses noted. Extremities:  Without clubbing or edema. Neurologic:  Alert and  oriented x4;  grossly normal neurologically. Skin:  Intact without significant lesions or rashes.. Psych:  Alert and cooperative. Normal mood and affect.  Intake/Output from previous day: 04/07 0701 - 04/08 0700 In: 4998.3 [P.O.:480; I.V.:848.3; Blood:670; IV CVELFYBOF:7510] Out: 400 [Urine:400] Intake/Output this shift:    Lab Results:  Recent Labs  06/20/15 0426  06/20/15 1643 06/20/15 2133 06/21/15 0316  WBC 6.4  --  6.1  --  5.7  HGB 7.0*  < > 7.0* 8.0* 7.6*  HCT 20.7*  < > 21.8* 24.1* 23.5*  PLT 172  --  176  --  153  < > = values in this interval not displayed. BMET  Recent Labs  06/19/15 1554 06/20/15 0426 06/21/15 0316  NA 142 143 142  K 4.0 3.6 3.6  CL 106 109 114*  CO2 24 22 19*  GLUCOSE 123* 87 95  BUN 82* 72* 50*  CREATININE 1.84* 1.57* 1.12*  CALCIUM 8.3* 8.7* 8.1*   LFT  Recent Labs  06/19/15 1554  PROT 6.1*  ALBUMIN 2.4*  AST 14*  ALT 7*  ALKPHOS 84  BILITOT 0.4   PT/INR  Recent Labs  06/19/15 2249  LABPROT 17.9*  INR 1.47   Hepatitis Panel No results for input(s): HEPBSAG, HCVAB, HEPAIGM, HEPBIGM in the last 72 hours.   IMPRESSION:  #79 66 year old female with  multiple medical problems admitted on 06/19/2015 with an acute recurrent GI bleed and hemoglobin of 6.2. Patient to be transfused fifth unit of packed RBCs this morning with hemoglobin of 7.6. Unfortunately patient has history of multiple AVMs, both small bowel and colonic and has had recurrent problems with GI bleeding. Patient just had EGD and enteroscopy a month ago with ablation of multiple post bulbar and proximal jejunal AVMs, and had many AVMs documented on colonoscopy December 2016 at 10-15 of them ablated. #2 hypotension-secondary to above-ears patient generally runs a softer blood pressure with recent outpatient BP documented at 96/46 #3 scleroderma #4dult-onset diabetes mellitus #5 pulmonary artery hypertension #6  CHF #7 history of atrial fibrillation #8  history of chronic iron deficiency anemia secondary to above #9 sickle cell trait #10 chronic kidney disease stage III  PLAN: Difficult situation as patient has venously documented multiple AVMs and likely has many more AVMs in the small bowel  #1 clear liquid diet today and nothing by mouth after midnight #2ontinue serial hemoglobins and transfuse to keep her hemoglobin 8 #3 l schedule for EGD and enteroscopy tomorrow with Dr. Lajuan Lines discussed in detail with patient and she is  agreeable to proceed #4 Continue PPI  Amy Esterwood  06/21/2015, 10:39 AM   I have reviewed the entire case in detail with the above APP and discussed the plan in detail.  Therefore, I agree with the diagnoses recorded above. In addition,  I have personally interviewed and examined the patient and have personally reviewed any abdominal/pelvic CT scan images.  Family history:  No GI bleeding in family My additional thoughts are as follows:  Recurrent melena and ABLA.  Known SB and colon AVMs. It is not clear to me that the treated colonic or jejunal lesions in last few months have truly been the sources.  Suspect more distal SB AVMs.  Plan:   Enteroscopy tomorrow.  If no obvious source, SB video capsule study Monday.  The benefits and risks of the planned procedure were described in detail with the patient or (when appropriate) their health care proxy.  Risks were outlined as including, but not limited to, bleeding, infection, perforation, adverse medication reaction leading to cardiac or pulmonary decompensation, or pancreatitis (if ERCP).  The limitation of incomplete mucosal visualization was also discussed.  No guarantees or warranties were given.   Increased risk procedure due to medical comorbidities.  Nelida Meuse III Pager [phone]  Mon-Fri 8a-5p [phone] after 5p, weekends, holidays

## 2015-06-23 ENCOUNTER — Telehealth: Payer: Self-pay | Admitting: Internal Medicine

## 2015-06-23 ENCOUNTER — Telehealth: Payer: Self-pay

## 2015-06-23 ENCOUNTER — Telehealth: Payer: Self-pay | Admitting: Licensed Clinical Social Worker

## 2015-06-23 DIAGNOSIS — D500 Iron deficiency anemia secondary to blood loss (chronic): Secondary | ICD-10-CM

## 2015-06-23 DIAGNOSIS — D649 Anemia, unspecified: Secondary | ICD-10-CM

## 2015-06-23 DIAGNOSIS — I5033 Acute on chronic diastolic (congestive) heart failure: Secondary | ICD-10-CM

## 2015-06-23 LAB — TYPE AND SCREEN
ABO/RH(D): O NEG
ANTIBODY SCREEN: POSITIVE
DAT, IgG: NEGATIVE
DONOR AG TYPE: NEGATIVE
DONOR AG TYPE: NEGATIVE
DONOR AG TYPE: NEGATIVE
Donor AG Type: NEGATIVE
Donor AG Type: NEGATIVE
UNIT DIVISION: 0
UNIT DIVISION: 0
UNIT DIVISION: 0
Unit division: 0
Unit division: 0
Unit division: 0
Unit division: 0
Unit division: 0
Unit division: 0
Unit division: 0

## 2015-06-23 LAB — CBC
HCT: 27.3 % — ABNORMAL LOW (ref 36.0–46.0)
Hemoglobin: 8.5 g/dL — ABNORMAL LOW (ref 12.0–15.0)
MCH: 26.9 pg (ref 26.0–34.0)
MCHC: 31.1 g/dL (ref 30.0–36.0)
MCV: 86.4 fL (ref 78.0–100.0)
PLATELETS: 188 10*3/uL (ref 150–400)
RBC: 3.16 MIL/uL — AB (ref 3.87–5.11)
RDW: 16.5 % — ABNORMAL HIGH (ref 11.5–15.5)
WBC: 6.4 10*3/uL (ref 4.0–10.5)

## 2015-06-23 LAB — BASIC METABOLIC PANEL
ANION GAP: 8 (ref 5–15)
BUN: 15 mg/dL (ref 6–20)
CALCIUM: 8.7 mg/dL — AB (ref 8.9–10.3)
CO2: 19 mmol/L — AB (ref 22–32)
CREATININE: 0.94 mg/dL (ref 0.44–1.00)
Chloride: 116 mmol/L — ABNORMAL HIGH (ref 101–111)
GFR calc Af Amer: >60 mL/min (ref >60)
GFR calc non Af Amer: >60 mL/min (ref >60)
Glucose, Bld: 96 mg/dL (ref 65–99)
Potassium: 4.3 mmol/L (ref 3.5–5.1)
SODIUM: 143 mmol/L (ref 135–145)

## 2015-06-23 MED ORDER — COLLAGENASE 250 UNIT/GM EX OINT: TOPICAL_OINTMENT | Freq: Every day | CUTANEOUS | Status: DC

## 2015-06-23 NOTE — Telephone Encounter (Signed)
APPT. REMINDER CALL, LMTCB

## 2015-06-23 NOTE — Progress Notes (Signed)
Subjective: No acute events overnight. She underwent endoscopy with GI yesterday which showed an AVM in the proximal jejunum with central ulceration, suspected source of bleeding. This was ablated and clipped. She reports feeling well today. She denies any additional bleeding.   Objective: Vital signs in last 24 hours: Filed Vitals:   06/23/15 0345 06/23/15 0346 06/23/15 0752 06/23/15 1116  BP: 92/50  88/51   Pulse: 61  60   Temp:  98.5 F (36.9 C) 98.4 F (36.9 C) 98.4 F (36.9 C)  TempSrc:  Oral Oral Oral  Resp: 16  17   Height:      Weight:      SpO2: 93%  94%    Weight change:   Intake/Output Summary (Last 24 hours) at 06/23/15 1146 Last data filed at 06/22/15 1930  Gross per 24 hour  Intake    500 ml  Output   1300 ml  Net   -800 ml  Physical Exam General: sitting up in chair, NAD HEENT: [place]/AT, EOMI, sclera anicteric, conjunctiva pale CV: RRR, no m/g/r Pulm: CTA bilaterally, breaths non-labored Abd: BS+, soft, mild diffuse tenderness Ext: warm, no peripheral edema Neuro: alert and oriented x 3  Lab Results: Basic Metabolic Panel:  Recent Labs Lab 06/21/15 0316 06/23/15 1022  NA 142 143  K 3.6 4.3  CL 114* 116*  CO2 19* 19*  GLUCOSE 95 96  BUN 50* 15  CREATININE 1.12* 0.94  CALCIUM 8.1* 8.7*   Liver Function Tests:  Recent Labs Lab 06/19/15 1554  AST 14*  ALT 7*  ALKPHOS 84  BILITOT 0.4  PROT 6.1*  ALBUMIN 2.4*   CBC:  Recent Labs Lab 06/22/15 0304 06/23/15 0220  WBC 4.5 6.4  HGB 8.4* 8.5*  HCT 25.3* 27.3*  MCV 84.3 86.4  PLT 114* 188   CBG:  Recent Labs Lab 06/20/15 0810  GLUCAP 70   Coagulation:  Recent Labs Lab 06/19/15 2249  LABPROT 17.9*  INR 1.47   Urine Drug Screen: Drugs of Abuse     Component Value Date/Time   LABOPIA POSITIVE* 10/02/2010 1128   COCAINSCRNUR NONE DETECTED 10/02/2010 1128   LABBENZ NONE DETECTED 10/02/2010 1128   AMPHETMU NONE DETECTED 10/02/2010 1128   THCU NONE DETECTED 10/02/2010  1128   LABBARB NONE DETECTED 10/02/2010 1128     Micro Results: Recent Results (from the past 240 hour(s))  MRSA PCR Screening     Status: None   Collection Time: 06/19/15 10:30 PM  Result Value Ref Range Status   MRSA by PCR NEGATIVE NEGATIVE Final    Comment:        The GeneXpert MRSA Assay (FDA approved for NASAL specimens only), is one component of a comprehensive MRSA colonization surveillance program. It is not intended to diagnose MRSA infection nor to guide or monitor treatment for MRSA infections.    Studies/Results: No results found. Medications: I have reviewed the patient's current medications. Scheduled Meds: . sodium chloride   Intravenous Once  . ambrisentan  10 mg Oral Daily  . amiodarone  200 mg Oral Daily  . collagenase   Topical Daily  . famotidine  20 mg Oral Daily  . fenofibrate  54 mg Oral Daily  . FLUoxetine  20 mg Oral q morning - 10a  . levothyroxine  25 mcg Oral QAC breakfast  . nortriptyline  20 mg Oral QHS  . pregabalin  150 mg Oral TID  . Selexipag  1,600 mcg Oral BID  . sodium chloride flush  3 mL Intravenous Q12H   Continuous Infusions:   PRN Meds:. Assessment/Plan: Acute GI Bleeding: Bleeding has now subsided. Endoscopy yesterday revealed an AVM in the proximal jejunum with central ulceration that was ablated and clipped. This is the likely source of her bleeding. She has a known history of GI bleeding secondary to AVMs. Hgb stable at 8.5 this morning. She has received a total of 5 units PRBCs. BPs stable in 44Y systolic.  - GI consulted, appreciate recommendations - Switch to PO Protonix  - Will have close follow up in clinic - I have spoken to our SW in clinic and will set her up with outpatient palliative care so she can get weekly CBCs to make sure her Hgb remains stable - Will order HHPT, 3 n 1, and home nurse to check initial CBC  AKI on CKD Stage 3: Cr 1.84 on admission with her baseline around 1.2. Cr improved to 1.12. Likely  had AKI due to volume depletion in setting of acute GI bleeding.   Left Hip Pain: She complains of left hip pain that started about 3 days ago. Denies any trauma or falls. Plain film shows OA. PT worked with her and recommending home health PT and 3 in 1, shower chair.  - PT>> home health PT and equipment to be ordered upon discharge   Scleroderma with Pulmonary HTN: Last Echo in Feb 2017 shows PAP 76 mmHg. She had a cardiac cath in Jan 2016 which showed a PAP of 68/29 consistent with moderate pulm HTN. She follows with Dr. Lamonte Sakai as an outpatient. She had PFTs in 2004 which showed a decreased diffusion capacity of 52%. I do not see a high resolution CT scan of her chest which should be performed with her hx of scleroderma to look for ILD. This can be done as an outpatient.  - Continue home Selexipag and Ambrisentan  Chronic Diastolic CHF: Last echo in Oct 2016 with EF 18-56%, grade 2 diastolic dysfunction. She follows with Dr. Aundra Dubin as an outpatient. She is on Metolazone 2.5 mg twice weekly and Torsemide 60 mg BID. She does not appear volume overloaded exam, especially in setting of GI bleeding. Her Cr is improving. She is +5.5 L but unclear how accurate this may be. She is going to follow up in clinic on 4/12 so will ask for her fluid status to be re-evaluated at that appointment and if her diuretics need to be restarted. - Will hold her diuretics for now. Fluid status will need to be reassessed at her outpatient follow up visit on 4/12  Hypothyroidism: Last TSH 3.853 in Feb 2017. She takes Synthroid 25 mcg daily at home. - Continue home Synthroid   Paroxysmal AFib: On amiodarone 200 mg daily. Not on anticoagulation due to history of GI bleeding. In NSR currently. - Continue home amiodarone   Type 2 DM: Diet controlled. Last HbA1c 5.0 in Feb 2017. - Monitor CBGs on bmet   Diet: Carb modified  VTE: SCDs Dispo: Discharge home today  The patient does have a current PCP Sid Falcon, MD)  and does need an [facility] hospital follow-up appointment after discharge.  The patient does not have transportation limitations that hinder transportation to clinic appointments.  .Services Needed at time of discharge: Y = Yes, Blank = No PT:   OT:   RN:   Equipment:   Other:     LOS: 4 days   Juliet Rude, MD 06/23/2015, 11:46 AM

## 2015-06-23 NOTE — Progress Notes (Signed)
Physical Therapy Treatment Patient Details Name: [name] MRN: [id number] DOB: January 01, 1951 Today's Date: 06/23/2015    History of Present Illness [name] is a 65yo woman with PMHx of GI bleeding secondary to AVMs, pulmonary hypertension secondary to scleroderma, sickle cell trait, type 2 diabetes that is diet controlled, paroxysmal AFib, chronic diastolic CHF, and CKD Stage 3 who presents today after having large bloody bowel movements at home.    PT Comments    [name] made good progress today.  She continues to rely on RW for support and energy conservation while ambulating but she ambulated 90 ft w/ supervision.    Follow Up Recommendations  Home health PT     Equipment Recommendations  3in1 (PT);Other (comment) (shower chair)    Recommendations for Other Services       Precautions / Restrictions Precautions Precautions: Fall Restrictions Weight Bearing Restrictions: No    Mobility  Bed Mobility               General bed mobility comments: Pt sitting in chair upon PT arrival  Transfers Overall transfer level: Needs assistance Equipment used: Rolling walker (2 wheeled) Transfers: Sit to/from Stand Sit to Stand: Supervision         General transfer comment: Good technique using RW.  Supervision for safety.  Ambulation/Gait Ambulation/Gait assistance: Supervision Ambulation Distance (Feet): 90 Feet Assistive device: Rolling walker (2 wheeled) Gait Pattern/deviations: Step-through pattern;Decreased stride length   Gait velocity interpretation: Below normal speed for age/gender General Gait Details: Decreased gait speed but pt steady using RW. Pt WB through RW, therefore deferred gait training w/ cane.  Supervision for safety.   Stairs            Wheelchair Mobility    Modified Rankin (Stroke Patients Only)       Balance Overall balance assessment: Needs assistance Sitting-balance support: Feet supported;No upper extremity  supported Sitting balance-Leahy Scale: Good     Standing balance support: No upper extremity supported;During functional activity Standing balance-Leahy Scale: Fair Standing balance comment: Pt able to adjust her gown in static standing                    Cognition Arousal/Alertness: Awake/alert Behavior During Therapy: WFL for tasks assessed/performed Overall Cognitive Status: Within Functional Limits for tasks assessed                      Exercises Other Exercises Other Exercises: sit<>stand x4 from recliner chair for strengthening.  Supervision for safety.    General Comments        Pertinent Vitals/Pain Pain Assessment: No/denies pain Pain Intervention(s): Limited activity within patient's tolerance;Monitored during session    Home Living                      Prior Function            PT Goals (current goals can now be found in the care plan section) Acute Rehab PT Goals Patient Stated Goal: To go home PT Goal Formulation: With patient Time For Goal Achievement: 06/27/15 Potential to Achieve Goals: Good Progress towards PT goals: Progressing toward goals    Frequency  Min 3X/week    PT Plan Current plan remains appropriate    Co-evaluation             End of Session   Activity Tolerance: Patient tolerated treatment well Patient left: in chair;with call bell/phone within reach  Time: 7124-5809 PT Time Calculation (min) (ACUTE ONLY): 16 min  Charges:  $Gait Training: 8-22 mins                    G Codes:      Collie Siad PT, DPT  Pager: [phone] Phone: [phone] 06/23/2015, 4:29 PM

## 2015-06-23 NOTE — Telephone Encounter (Signed)
Ms.Tauzin currently hospitalized at this time.  Attending physician inquired with [facility] CSW regarding pt's eligibility for home based palliative care as pt is agreeable.  Ms. Delon is on the [place] registry and eligible for [employer].  CSW placed call to [facility] and informed referral could be received while patient hospitalized.  Pt will be assess upon her return home.  CSW faxed referral to [facility].

## 2015-06-23 NOTE — Discharge Summary (Addendum)
Name: [name] MRN: [id number] DOB: May 17, 1950 65 y.o. PCP: Sid Falcon, MD  Date of Admission: 06/19/2015  4:04 PM Date of Discharge: 06/23/2015 Attending Physician: Bartholomew Crews, MD  Discharge Diagnosis: Principal Problem Active GI Bleeding Active Problems AKI on CKD 3 Left Hip Pain Scleroderma with Pulmonary HTN Chronic Diastolic CHF Hypothyroidism Paroxysmal AFib Type 2 DM Right Buttock Pressure Ulcer Stage 2  Discharge Medications:   Medication List    STOP taking these medications        metolazone 2.5 MG tablet  Commonly known as:  ZAROXOLYN     potassium chloride 10 MEQ tablet  Commonly known as:  K-DUR,KLOR-CON     torsemide 20 MG tablet  Commonly known as:  DEMADEX      TAKE these medications        ADCIRCA 20 MG Tabs  Generic drug:  Tadalafil (PAH)  Take 20 mg by mouth daily.     ambrisentan 10 MG tablet  Commonly known as:  LETAIRIS  Take 1 tablet (10 mg total) by mouth daily.     amiodarone 200 MG tablet  Commonly known as:  PACERONE  Take 1 tablet (200 mg total) by mouth daily.     collagenase ointment  Commonly known as:  SANTYL  Apply topically daily.     esomeprazole 40 MG capsule  Commonly known as:  NEXIUM  Take 40 mg by mouth daily at 12 noon.     fenofibrate 54 MG tablet  Take 1 tablet (54 mg total) by mouth daily.     FLUoxetine 20 MG capsule  Commonly known as:  PROZAC  Take 1 capsule (20 mg total) by mouth every morning.     HYDROcodone-acetaminophen 10-325 MG tablet  Commonly known as:  NORCO  Take 1 tablet by mouth every 6 (six) hours as needed. pain     levothyroxine 25 MCG tablet  Commonly known as:  SYNTHROID, LEVOTHROID  Take 1 tablet (25 mcg total) by mouth daily before breakfast.     nortriptyline 10 MG capsule  Commonly known as:  PAMELOR  Take 2 capsules (20 mg total) by mouth at bedtime.     pregabalin 150 MG capsule  Commonly known as:  LYRICA  Take 1 capsule (150 mg total) by mouth 3  (three) times daily.     Selexipag 1600 MCG Tabs  Commonly known as:  UPTRAVI  Take 1,600 mcg by mouth 2 (two) times daily.        Disposition and follow-up:   PamelaMackinsey S [name] was discharged from [facility] in Good condition.  At the hospital follow up visit please address:  1.  GI Bleeding: Check if she has had any additional bleeding.  Diastolic CHF: Determine her fluid status and if her diuretics should be restarted.   2.  Labs / imaging needed at time of follow-up: CBC  3.  Pending labs/ test needing follow-up: None  Follow-up Appointments:     Follow-up Information    Follow up with [facility] On 06/25/2015.   Why:  9:00 AM   Contact information:   [address] [place] [phone]      Discharge Instructions: Discharge Instructions    Diet - low sodium heart healthy    Complete by:  As directed      Increase activity slowly    Complete by:  As directed  Consultations: Treatment Team:  Doran Stabler, MD  Procedures Performed:  Dg Hip Unilat With Pelvis 2-3 Views Left  06/19/2015  CLINICAL DATA:  Left hip pain. EXAM: DG HIP (WITH OR WITHOUT PELVIS) 2-3V LEFT COMPARISON:  CT of the abdomen and pelvis on 05/12/2015 FINDINGS: Mild superior joint space narrowing is consistent with osteoarthritis. No fracture, dislocation or bony lesion identified. The bony pelvis is unremarkable. IMPRESSION: Mild osteoarthritis of the left hip.  No acute fracture identified. Electronically Signed   By: Aletta Edouard M.D.   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 She reports her stool was mostly dark  blood. Family was present in the room and state that the bowel movement was large with a lot of blood. She reports feeling very weak, tired, and lightheaded. She does not contribute much to the history as she keeps closing her eyes to rest. She denies chest pain, SOB, abdominal pain, nausea, and vomiting. She is not on any anticoagulants.   Her Hgb was found to be 6.2 on admission. FOBT positive. She was started on 2 units PRBCs.   Hospital Course by problem list:  GI Bleed: Ms. Manganiello was admitted to the step down unit for observation after receiving 2 units PRBCs in the ED.  Her morning Hb was 7.0 and she was given an additional 1unit.  She experienced continual lower GI bleeding that necessitated 3 more units of RBC.  GI was consulted and she was taken for upper endoscopy where 1 ulcerated AVM was detected in the proximal jejunum.  It was ablated and banded.  GI recommended follow-up as an outpatient for capsule endoscopy.  Hypotension: Ms. Dekker blood pressure was 80's/40's the ED, necessitating admission to the step down unit.  She received a 1 L bolus during transport to the hospital, and was given maintenance fluids during her admission.  During her admission her BP dropped to 78/42 and she was given additional fluid and her BP responded well.  Her BP corrected and she was discharged with pressures at her baseline (which is normally low).    Osteoarthritis of the L hip: Pt complained of L hip pain that was interfering with her mobility at her home.  Hip x-ray revealed osteoarthritic changes.  PT was consulted who recommended home health PT and a 3-in-1, and shower stool for home.   Scleroderma w/ secondary pulmonary hypertension: [name] home medications tadalafil and ambrisentan were held on hospital day 1 owing to hypotension.  They were restarted and continued on hospital day 2 after her pressures normalized, and subsequently continued for the duration of her admission.    Chronic  medical conditions: Medications for hypothyroidism, paroxysmal a-fib were continued during hospital admission and the patient did not experience exacerbations of her chronic disease. Her home diuretics were held in the setting of her GI bleeding and low BPs. She will need to have her fluid status reassessed at her outpatient follow up visit and determine if her diuretics should be restarted.  All other home medications were restarted at discharge. She will also need close follow up of her stage 2 right buttock pressure ulcer to make sure this does not advance.   Discharge Vitals:  BP 88/51 mmHg  Pulse 60  Temp(Src) 97.4 F (36.3 C) (Oral)  Resp 17  Ht 5' 4" (1.626 m)  Wt 143 lb (64.864 kg)  BMI 24.53 kg/m2  SpO2 94% Physical Exam General: sitting up in chair, NAD HEENT: [place]/AT, EOMI, sclera anicteric, conjunctiva pale CV: RRR, no m/g/r Pulm: CTA bilaterally, breaths non-labored Abd: BS+, soft, mild diffuse tenderness Ext: warm, no peripheral edema Neuro: alert and oriented x 3  Discharge Labs:  Results for orders placed or performed during the hospital encounter of 06/19/15 (from the past 24 hour(s))  CBC     Status: Abnormal   Collection Time: 06/23/15  2:20 AM  Result Value Ref Range   WBC 6.4 4.0 - 10.5 K/uL   RBC 3.16 (L) 3.87 - 5.11 MIL/uL   Hemoglobin 8.5 (L) 12.0 - 15.0 g/dL   HCT 27.3 (L) 36.0 - 46.0 %   MCV 86.4 78.0 - 100.0 fL   MCH 26.9 26.0 - 34.0 pg   MCHC 31.1 30.0 - 36.0 g/dL   RDW 16.5 (H) 11.5 - 15.5 %   Platelets 188 150 - 400 K/uL  Basic metabolic panel     Status: Abnormal   Collection Time: 06/23/15 10:22 AM  Result Value Ref Range   Sodium 143 135 - 145 mmol/L   Potassium 4.3 3.5 - 5.1 mmol/L   Chloride 116 (H) 101 - 111 mmol/L   CO2 19 (L) 22 - 32 mmol/L   Glucose, Bld 96 65 - 99 mg/dL   BUN 15 6 - 20 mg/dL   Creatinine, Ser 0.94 0.44 - 1.00 mg/dL   Calcium 8.7 (L) 8.9 - 10.3 mg/dL   GFR calc non Af Amer >60 >60 mL/min   GFR calc Af Amer >60 >60  mL/min   Anion gap 8 5 - 15   *Note: Due to a large number of results and/or encounters for the requested time period, some results have not been displayed. A complete set of results can be found in Results Review.    Signed: Juliet Rude, MD 06/23/2015, 2:29 PM    Services Ordered on Discharge: Home health RN, PT Equipment Ordered on Discharge: 3 n 1, shower stool

## 2015-06-23 NOTE — Consult Note (Signed)
   [facility] CM Inpatient Consult   06/23/2015  [name] September 23, 1950 [id number] Patient has been screened for [facility] services.  Chart review reveals patient has sought services with  [facility] for [place] prior to admission.  Patient is under the [place] registry.   Patient is currently in stepdown. If patient's needs changes please contact: Natividad Brood, RN BSN [facility] Liaison  [phone] business mobile phone Toll free office [phone]

## 2015-06-23 NOTE — Care Management Important Message (Signed)
Important Message  Patient Details  Name: [name] MRN: [id number] Date of Birth: [birth year]   Medicare Important Message Given:  Yes    Nathen May 06/23/2015, 12:38 PM

## 2015-06-23 NOTE — Progress Notes (Signed)
  Date: 06/23/2015  Patient name: [name]  Medical record number: [id number]  Date of birth: Mar 21, 1950   This patient has been seen and the plan of care was discussed with the house staff. Please see their note for complete details. I concur with their findings with the following additions/corrections: [name] looks much better today and is interactive and wants to return home. She will need weekly HgB and if stable, PRBC in short stay rather than re-admission. We will coordinate with [facility] triage, home health, and home palliaiitve care.  Bartholomew Crews, MD 06/23/2015, 3:25 PM

## 2015-06-23 NOTE — Telephone Encounter (Signed)
CSW requesting [facility] RN for weekly blood draws upon hospital discharge.  Once pt has been enrolled with home based palliative care, it should be discussed.

## 2015-06-23 NOTE — Care Management Note (Signed)
Case Management Note  Patient Details  Name: [name] MRN: [id number] Date of Birth: 07-06-1950  Subjective/Objective:    Patient is from home with grand daughter, she presented with GIB , she is ready for dc today, home with [facility] services for [facility], and HHPT, she chose [facility], referral made to [facility].  Soc will begin 24-48 hrs post dc. NCM informed patient that co pay for bsc and shower chair will be around $38 each  Through insurance or she could get them from [place] cheaper, patient has decided she will get the DME from [place].  Patient has transportation home.  And she has insurance to cover medications.                 Action/Plan:   Expected Discharge Date:                  Expected Discharge Plan:  [place]  In-House Referral:     Discharge planning Services  CM Consult  Post Acute Care Choice:  Home Health Choice offered to:  Patient  DME Arranged:    DME Agency:     HH Arranged:  RN, PT [place] Agency:  [place]  Status of Service:  Completed, signed off  Medicare Important Message Given:  Yes Date Medicare IM Given:    Medicare IM give by:    Date Additional Medicare IM Given:    Additional Medicare Important Message give by:     If discussed at [place] of Stay Meetings, dates discussed:    Additional Comments:  Zenon Mayo, RN 06/23/2015, 2:29 PM

## 2015-06-23 NOTE — Telephone Encounter (Signed)
-----  Message from Barron Alvine, RN sent at 06/23/2015  3:06 PM EDT -----   ----- Message -----    From: Manus Gunning, MD    Sent: 06/23/2015   9:41 AM      To: Hulan Saas, RN  Rollene Fare, This patient is likely going to be discharged in the next 1-2 days. She is a former patient of Dr. Deatra Ina but recently seen and had endoscopy with Dr. Loletha Carrow during this admission and would likely be assigned to him. Can you forward to his nurse, this patient needs a capsule endoscopy in about 2 weeks or so after discharge, with CBC in about a week. Thanks

## 2015-06-23 NOTE — Progress Notes (Addendum)
Progress Note   Subjective  Patient is s/p enteroscopy with ablation and clipping of small bowel AVM. She has been on a regular diet, tolerating it without complaints. Had a BM this AM, she thinks mostly normal perhaps slightly dark, perhaps passed old blood. Her Hgb has been stable since transfusion   Objective   Vital signs in last 24 hours: Temp:  [97.5 F (36.4 C)-98.5 F (36.9 C)] 98.4 F (36.9 C) (04/10 0752) Pulse Rate:  [50-73] 61 (04/10 0345) Resp:  [14-25] 16 (04/10 0345) BP: (88-149)/(50-88) 92/50 mmHg (04/10 0345) SpO2:  [90 %-100 %] 93 % (04/10 0345) Last BM Date: 06/21/15 General:    [name] in NAD Heart:  Regular rate and rhythm; no murmurs Lungs: Respirations even and unlabored, lungs CTA bilaterally Abdomen:  Soft, nontender and nondistended. Normal bowel sounds. Extremities:  Without edema. Neurologic:  Alert and oriented,  grossly normal neurologically. Psych:  Cooperative. Normal mood and affect.  Intake/Output from previous day: 04/09 0701 - 04/10 0700 In: 500 [I.V.:500] Out: 1300 [Urine:1300] Intake/Output this shift:    Lab Results:  Recent Labs  06/21/15 1822 06/22/15 0304 06/23/15 0220  WBC 5.1 4.5 6.4  HGB 9.0* 8.4* 8.5*  HCT 28.1* 25.3* 27.3*  PLT 156 114* 188   BMET  Recent Labs  06/21/15 0316  NA 142  K 3.6  CL 114*  CO2 19*  GLUCOSE 95  BUN 50*  CREATININE 1.12*  CALCIUM 8.1*   LFT No results for input(s): PROT, ALBUMIN, AST, ALT, ALKPHOS, BILITOT, BILIDIR, IBILI in the last 72 hours. PT/INR No results for input(s): LABPROT, INR in the last 72 hours.  Studies/Results: No results found.     Assessment / Plan:   66 y/o female with multiple medical problems and history of GI bleeding thought to be due to AVMs, presented with worsening anemia and melena. Enteroscopy yesterday showing one small bowel AVM which was ablated / clipped. Hgb stable since transfusion with no active bleeding noted. Tolerating a regular  diet at this time. Prior workup noted and case discussed with Dr. Loletha Carrow. I do think a capsule endoscopy is a good idea to further evaluate the rest of her small bowel and assess AVM burden to help risk stratify for further GI bleeding, and determine if balloon enteroscopy would be helpful if there is a high burden of more distal small bowel AVMs. With hemostasis clip in place, agree that this should be done as an outpatient if she is otherwise stable at this time. I would monitor her today and consider discharge later this afternoon or tomorrow AM if she has no further evidence of active bleeding. Could consider adding BMP to get BUN level to ensure downtrending. Our office will coordinate follow up for her capsule study in the next 2 weeks or so. She should be avoiding all NSAIDs at this time.  Please call with questions / concerns.   Alex Cellar, MD [facility] Pager [phone]     LOS: 4 days   [name]  06/23/2015, 9:33 AM

## 2015-06-24 ENCOUNTER — Encounter (HOSPITAL_COMMUNITY): Payer: Self-pay | Admitting: Gastroenterology

## 2015-06-24 ENCOUNTER — Other Ambulatory Visit: Payer: Self-pay | Admitting: *Deleted

## 2015-06-24 ENCOUNTER — Other Ambulatory Visit: Payer: Self-pay

## 2015-06-24 DIAGNOSIS — Z1231 Encounter for screening mammogram for malignant neoplasm of breast: Secondary | ICD-10-CM

## 2015-06-24 NOTE — Telephone Encounter (Signed)
CAPSULE ENDOSCOPY PATIENT INSTRUCTION SHEET  [name] 11-16-1950 [id number]   1. 07/03/15 Seven (7) days prior to capsule endoscopy stop taking iron supplements and carafate.  2. 07/08/15 Two (2) days prior to capsule endoscopy stop taking aspirin or any arthritis drugs.  3. 07/09/15 Day before capsule endoscopy purchase a 238 gram bottle of Miralax from the laxative section of your drug store, and a 32 oz. bottle of Gatorade (no red).    4. 07/09/15 One (1) day prior to capsule endoscopy: a) Stop smoking. b) Eat a regular diet until 12:00 Noon. c) After 12:00 Noon take only the following: Black coffee  Jell-O (no fruit or red Jell-o) Water   Bouillon (chicken or beef) 7-Up   Cranberry Juice Tea   Kool-Aid Popsicle (not red) Sprite   Coke Ginger Ale  Pepsi Mountain Dew Gatorade d) At 6:00 pm the evening before your appointment, drink 7 capfuls (105 grams) of Miralax with 32 oz. Gatorade. Drink 8 oz every 15 minutes until gone. e) Nothing to eat or drink after midnight except medications with a sip of water.  5. 07/10/15 Day of capsule endoscopy: Wear loose two-piece clothing to your exam. No medications for 2 hours prior to your test.  6. Please arrive at [facility]  3rd floor patient registration area by 8 am on: 07/10/15.   For any questions: Call [place] [place] at [phone] and ask to speak with one of the capsule endoscopy nurses.  The above instructions have been reviewed and explained to me by [facility] RN

## 2015-06-24 NOTE — Telephone Encounter (Signed)
Capsule Endo scheduled, pt instructed and medications reviewed.  Patient instructions mailed to home.  Patient to call with any questions or concerns. Information on Dr Loletha Carrow desk to be completed prior to procedure.

## 2015-06-25 ENCOUNTER — Telehealth: Payer: Self-pay

## 2015-06-25 ENCOUNTER — Ambulatory Visit: Payer: Medicare Other | Admitting: Internal Medicine

## 2015-06-25 ENCOUNTER — Other Ambulatory Visit: Payer: Self-pay

## 2015-06-25 DIAGNOSIS — K922 Gastrointestinal hemorrhage, unspecified: Secondary | ICD-10-CM

## 2015-06-25 DIAGNOSIS — D62 Acute posthemorrhagic anemia: Secondary | ICD-10-CM

## 2015-06-25 DIAGNOSIS — K921 Melena: Secondary | ICD-10-CM

## 2015-06-25 NOTE — Telephone Encounter (Signed)
Per Dr Loletha Carrow the capsule Endo has been changed from being placed orally to being placed at [place] endoscopically.  09/02/15 at 930 am.  The pt has been notified and new instructions have been mailed to the pt.  She was advised to throw away the instructions for 07/10/15

## 2015-06-26 DIAGNOSIS — K922 Gastrointestinal hemorrhage, unspecified: Secondary | ICD-10-CM | POA: Diagnosis not present

## 2015-06-26 DIAGNOSIS — I5032 Chronic diastolic (congestive) heart failure: Secondary | ICD-10-CM | POA: Diagnosis not present

## 2015-06-26 DIAGNOSIS — E1143 Type 2 diabetes mellitus with diabetic autonomic (poly)neuropathy: Secondary | ICD-10-CM | POA: Diagnosis not present

## 2015-06-26 DIAGNOSIS — I130 Hypertensive heart and chronic kidney disease with heart failure and stage 1 through stage 4 chronic kidney disease, or unspecified chronic kidney disease: Secondary | ICD-10-CM | POA: Diagnosis not present

## 2015-06-26 DIAGNOSIS — I272 Other secondary pulmonary hypertension: Secondary | ICD-10-CM | POA: Diagnosis not present

## 2015-06-26 DIAGNOSIS — N183 Chronic kidney disease, stage 3 (moderate): Secondary | ICD-10-CM | POA: Diagnosis not present

## 2015-06-26 DIAGNOSIS — K3184 Gastroparesis: Secondary | ICD-10-CM | POA: Diagnosis not present

## 2015-06-26 DIAGNOSIS — K219 Gastro-esophageal reflux disease without esophagitis: Secondary | ICD-10-CM | POA: Diagnosis not present

## 2015-06-26 DIAGNOSIS — E039 Hypothyroidism, unspecified: Secondary | ICD-10-CM | POA: Diagnosis not present

## 2015-06-26 DIAGNOSIS — I480 Paroxysmal atrial fibrillation: Secondary | ICD-10-CM | POA: Diagnosis not present

## 2015-06-26 DIAGNOSIS — D509 Iron deficiency anemia, unspecified: Secondary | ICD-10-CM | POA: Diagnosis not present

## 2015-06-26 DIAGNOSIS — E1140 Type 2 diabetes mellitus with diabetic neuropathy, unspecified: Secondary | ICD-10-CM | POA: Diagnosis not present

## 2015-06-26 DIAGNOSIS — E1122 Type 2 diabetes mellitus with diabetic chronic kidney disease: Secondary | ICD-10-CM | POA: Diagnosis not present

## 2015-06-27 ENCOUNTER — Other Ambulatory Visit: Payer: Self-pay | Admitting: *Deleted

## 2015-06-27 DIAGNOSIS — D509 Iron deficiency anemia, unspecified: Secondary | ICD-10-CM

## 2015-06-27 DIAGNOSIS — I272 Other secondary pulmonary hypertension: Secondary | ICD-10-CM | POA: Diagnosis not present

## 2015-06-27 DIAGNOSIS — D500 Iron deficiency anemia secondary to blood loss (chronic): Secondary | ICD-10-CM

## 2015-06-27 DIAGNOSIS — K922 Gastrointestinal hemorrhage, unspecified: Secondary | ICD-10-CM

## 2015-06-27 DIAGNOSIS — I130 Hypertensive heart and chronic kidney disease with heart failure and stage 1 through stage 4 chronic kidney disease, or unspecified chronic kidney disease: Secondary | ICD-10-CM | POA: Diagnosis not present

## 2015-06-27 DIAGNOSIS — N183 Chronic kidney disease, stage 3 (moderate): Secondary | ICD-10-CM | POA: Diagnosis not present

## 2015-06-27 DIAGNOSIS — E1143 Type 2 diabetes mellitus with diabetic autonomic (poly)neuropathy: Secondary | ICD-10-CM | POA: Diagnosis not present

## 2015-06-27 DIAGNOSIS — K219 Gastro-esophageal reflux disease without esophagitis: Secondary | ICD-10-CM | POA: Diagnosis not present

## 2015-06-27 DIAGNOSIS — E1140 Type 2 diabetes mellitus with diabetic neuropathy, unspecified: Secondary | ICD-10-CM | POA: Diagnosis not present

## 2015-06-27 DIAGNOSIS — I5032 Chronic diastolic (congestive) heart failure: Secondary | ICD-10-CM | POA: Diagnosis not present

## 2015-06-27 DIAGNOSIS — E039 Hypothyroidism, unspecified: Secondary | ICD-10-CM | POA: Diagnosis not present

## 2015-06-27 DIAGNOSIS — I480 Paroxysmal atrial fibrillation: Secondary | ICD-10-CM | POA: Diagnosis not present

## 2015-06-27 DIAGNOSIS — E1122 Type 2 diabetes mellitus with diabetic chronic kidney disease: Secondary | ICD-10-CM | POA: Diagnosis not present

## 2015-06-27 DIAGNOSIS — K3184 Gastroparesis: Secondary | ICD-10-CM | POA: Diagnosis not present

## 2015-06-30 ENCOUNTER — Ambulatory Visit (HOSPITAL_BASED_OUTPATIENT_CLINIC_OR_DEPARTMENT_OTHER): Payer: Medicare Other | Admitting: Hematology and Oncology

## 2015-06-30 ENCOUNTER — Ambulatory Visit (HOSPITAL_BASED_OUTPATIENT_CLINIC_OR_DEPARTMENT_OTHER): Payer: Medicare Other

## 2015-06-30 ENCOUNTER — Encounter: Payer: Self-pay | Admitting: Hematology and Oncology

## 2015-06-30 ENCOUNTER — Telehealth: Payer: Self-pay | Admitting: Hematology and Oncology

## 2015-06-30 ENCOUNTER — Telehealth (HOSPITAL_COMMUNITY): Payer: Self-pay | Admitting: Cardiology

## 2015-06-30 ENCOUNTER — Other Ambulatory Visit (HOSPITAL_BASED_OUTPATIENT_CLINIC_OR_DEPARTMENT_OTHER): Payer: Medicare Other

## 2015-06-30 VITALS — BP 99/44 | HR 72 | Temp 98.0°F | Resp 18 | Ht 64.0 in | Wt 155.2 lb

## 2015-06-30 DIAGNOSIS — K5521 Angiodysplasia of colon with hemorrhage: Secondary | ICD-10-CM

## 2015-06-30 DIAGNOSIS — D500 Iron deficiency anemia secondary to blood loss (chronic): Secondary | ICD-10-CM | POA: Diagnosis not present

## 2015-06-30 DIAGNOSIS — D509 Iron deficiency anemia, unspecified: Secondary | ICD-10-CM

## 2015-06-30 DIAGNOSIS — N189 Chronic kidney disease, unspecified: Secondary | ICD-10-CM

## 2015-06-30 DIAGNOSIS — D631 Anemia in chronic kidney disease: Secondary | ICD-10-CM | POA: Diagnosis not present

## 2015-06-30 DIAGNOSIS — K922 Gastrointestinal hemorrhage, unspecified: Secondary | ICD-10-CM

## 2015-06-30 LAB — BASIC METABOLIC PANEL
ANION GAP: 11 meq/L (ref 3–11)
BUN: 42.2 mg/dL — AB (ref 7.0–26.0)
CHLORIDE: 101 meq/L (ref 98–109)
CO2: 26 meq/L (ref 22–29)
Calcium: 8.1 mg/dL — ABNORMAL LOW (ref 8.4–10.4)
Creatinine: 1.8 mg/dL — ABNORMAL HIGH (ref 0.6–1.1)
EGFR: 34 mL/min/{1.73_m2} — AB (ref >90)
GLUCOSE: 85 mg/dL (ref 70–140)
POTASSIUM: 3.8 meq/L (ref 3.5–5.1)
Sodium: 138 mEq/L (ref 136–145)

## 2015-06-30 LAB — CBC WITH DIFFERENTIAL/PLATELET
BASO%: 0.4 % (ref 0.0–2.0)
Basophils Absolute: 0 10*3/uL (ref 0.0–0.1)
EOS%: 0.9 % (ref 0.0–7.0)
Eosinophils Absolute: 0.1 10*3/uL (ref 0.0–0.5)
HEMATOCRIT: 27.2 % — AB (ref 34.8–46.6)
HEMOGLOBIN: 8.8 g/dL — AB (ref 11.6–15.9)
LYMPH#: 1 10*3/uL (ref 0.9–3.3)
LYMPH%: 15.8 % (ref 14.0–49.7)
MCH: 27.1 pg (ref 25.1–34.0)
MCHC: 32.6 g/dL (ref 31.5–36.0)
MCV: 83.2 fL (ref 79.5–101.0)
MONO#: 0.5 10*3/uL (ref 0.1–0.9)
MONO%: 8.7 % (ref 0.0–14.0)
NEUT%: 74.2 % (ref 38.4–76.8)
NEUTROS ABS: 4.5 10*3/uL (ref 1.5–6.5)
PLATELETS: 321 10*3/uL (ref 145–400)
RBC: 3.26 10*6/uL — ABNORMAL LOW (ref 3.70–5.45)
RDW: 17.4 % — AB (ref 11.2–14.5)
WBC: 6.1 10*3/uL (ref 3.9–10.3)

## 2015-06-30 MED ORDER — SODIUM CHLORIDE 0.9 % IV SOLN
Freq: Once | INTRAVENOUS | Status: AC
  Administered 2015-06-30: 16:00:00 via INTRAVENOUS

## 2015-06-30 MED ORDER — SODIUM CHLORIDE 0.9 % IV SOLN
510.0000 mg | Freq: Once | INTRAVENOUS | Status: AC
  Administered 2015-06-30: 510 mg via INTRAVENOUS
  Filled 2015-06-30: qty 17

## 2015-06-30 NOTE — Patient Instructions (Signed)

## 2015-06-30 NOTE — Assessment & Plan Note (Addendum)
Acute on chronic severe iron deficiency anemia ( unresponsive to oral iron therapy) : Hospitalization 02/20/2015 to 02/26/2015 (Hb 5.5 angiodysplasia of colon with hemorrhage , CHF due to PAH, IDA, DM2, CKD-3) 5 Units of PRBC given. Colonoscopy 02/24/2015: numerous AVMs Undergone ablation Hospitalized 06/19/2015 to 06/27/2015: Active GI bleeding  Prior IV iron treatments: 05/04/2014 2, 12/24/2014 2, 01/29/2015 2, 02/25/2015 1, 05/02/15 x 2  Plan: 1. CBC  checked today and I recommended to transfuse IV iron feraheme 510 mg. She has transportation issues 2. Follow-up with GI and cardiology  Return to clinic in 2 months for follow-up with labs and will block time for IV iron infusion.

## 2015-06-30 NOTE — Telephone Encounter (Signed)
Patient called with concerns regarding increased weight Patient reports her weight is up 10-15 lbs, patient reports she was recently in the hospital for "bleeding" She was dry during admission and was given a lot of IV fluids per patient Patient was also told to HOLD torsemide, metolazone, and potassium post discharge  Patient reports once she got home and realized how much fluid had built up she restarted her torsemide and potassium however cannot get fluid under control and c/o increased SOB  Please advsie

## 2015-06-30 NOTE — Telephone Encounter (Signed)
With that much weight gain, needs to resume torsemide 60 mg bid and KCl 40 daily.  Creatinine up to 1.8, so hold off on metolazone for now.  Get her office appt this week.

## 2015-06-30 NOTE — Telephone Encounter (Signed)
appt made and avs printed

## 2015-06-30 NOTE — Progress Notes (Signed)
Patient Care Team: Sid Falcon, MD as PCP - General (Internal Medicine) Michel Bickers, MD as Consulting Physician (Infectious Diseases) Dyke Maes, OD (Optometry)  DIAGNOSIS:  1. Recurrent iron deficiency anemia due to recurrent GI bleed. 2. Anemia of chronic kidney disease   CHIEF COMPLIANT:  recent hospitalization for acute GI bleeding   INTERVAL HISTORY: [name] is a 65 year old American lady with above-mentioned history of recurrent iron deficiency anemia and required hospitalization recently for acute GI bleeding. She is here today for follow-up with CBC and iron studies. She reports to me that the GI bleeding has subsided.  REVIEW OF SYSTEMS:   Constitutional: Denies fevers, chills or abnormal weight loss, fatigue and tiredness uses a walker to get around  Eyes: Denies blurriness of vision Ears, nose, mouth, throat, and face: Denies mucositis or sore throat Respiratory: Denies cough, dyspnea or wheezes Cardiovascular: Denies palpitation, chest discomfort GastrointestinalRecent bright red blood per rectum Skin: Denies abnormal skin rashes Lymphatics: Denies new lymphadenopathy or easy bruising Neurological:Denies numbness, tingling or new weaknesses Behavioral/Psych: Mood is stable, no new changes  Extremities: No lower extremity edema  All other systems were reviewed with the patient and are negative.  I have reviewed the past medical history, past surgical history, social history and family history with the patient and they are unchanged from previous note.  ALLERGIES:  is allergic to cephalexin; ciprofloxacin; codeine; contrast media; and iohexol.  MEDICATIONS:  Current Outpatient Prescriptions  Medication Sig Dispense Refill  . ADCIRCA 20 MG TABS Take 20 mg by mouth daily.    Marland Kitchen ambrisentan (LETAIRIS) 10 MG tablet Take 1 tablet (10 mg total) by mouth daily. 30 tablet 11  . amiodarone (PACERONE) 200 MG tablet Take 1 tablet (200 mg total) by mouth daily. 30  tablet 6  . collagenase (SANTYL) ointment Apply topically daily. 15 g 2  . esomeprazole (NEXIUM) 40 MG capsule Take 40 mg by mouth daily at 12 noon.     . fenofibrate 54 MG tablet Take 1 tablet (54 mg total) by mouth daily. 30 tablet 12  . FLUoxetine (PROZAC) 20 MG capsule Take 1 capsule (20 mg total) by mouth every morning. 30 capsule 5  . HYDROcodone-acetaminophen (NORCO) 10-325 MG tablet Take 1 tablet by mouth every 6 (six) hours as needed. pain  0  . levothyroxine (SYNTHROID, LEVOTHROID) 25 MCG tablet Take 1 tablet (25 mcg total) by mouth daily before breakfast. 30 tablet 11  . nortriptyline (PAMELOR) 10 MG capsule Take 2 capsules (20 mg total) by mouth at bedtime. 60 capsule 8  . pregabalin (LYRICA) 150 MG capsule Take 1 capsule (150 mg total) by mouth 3 (three) times daily. 90 capsule 5  . Selexipag (UPTRAVI) 1600 MCG TABS Take 1,600 mcg by mouth 2 (two) times daily. 60 tablet 3   No current facility-administered medications for this visit.    PHYSICAL EXAMINATION: ECOG PERFORMANCE STATUS: 1 - Symptomatic but completely ambulatory  Filed Vitals:   06/30/15 1505  BP: 99/44  Pulse: 72  Temp: 98 F (36.7 C)  Resp: 18   Filed Weights   06/30/15 1505  Weight: 155 lb 3.2 oz (70.398 kg)    GENERAL:alert, no distress and comfortable SKIN: skin color, texture, turgor are normal, no rashes or significant lesions EYES: normal, Conjunctiva are pink and non-injected, sclera clear OROPHARYNX:no exudate, no erythema and lips, buccal mucosa, and tongue normal  NECK: supple, thyroid normal size, non-tender, without nodularity LYMPH:  no palpable lymphadenopathy in the cervical, axillary or  inguinal LUNGS: clear to auscultation and percussion with normal breathing effort HEART: Ejection systolic murmur in the mitral area ABDOMEN:abdomen soft, non-tender and normal bowel sounds MUSCULOSKELETAL:no cyanosis of digits and no clubbing  NEURO: alert & oriented x 3 with fluent speech, no focal  motor/sensory deficits EXTREMITIES: No lower extremity edema   LABORATORY DATA:  I have reviewed the data as listed   Chemistry      Component Value Date/Time   NA 138 06/30/2015 1449   NA 143 06/23/2015 1022   K 3.8 06/30/2015 1449   K 4.3 06/23/2015 1022   CL 116* 06/23/2015 1022   CO2 26 06/30/2015 1449   CO2 19* 06/23/2015 1022   BUN 42.2* 06/30/2015 1449   BUN 15 06/23/2015 1022   CREATININE 1.8* 06/30/2015 1449   CREATININE 0.94 06/23/2015 1022   CREATININE 1.82* 09/04/2014 1145      Component Value Date/Time   CALCIUM 8.1* 06/30/2015 1449   CALCIUM 8.7* 06/23/2015 1022   ALKPHOS 84 06/19/2015 1554   AST 14* 06/19/2015 1554   ALT 7* 06/19/2015 1554   BILITOT 0.4 06/19/2015 1554   BILITOT 0.3 04/23/2015 1049       Lab Results  Component Value Date   WBC 6.1 06/30/2015   HGB 8.8* 06/30/2015   HCT 27.2* 06/30/2015   MCV 83.2 06/30/2015   PLT 321 06/30/2015   NEUTROABS 4.5 06/30/2015   ASSESSMENT & PLAN:  Iron deficiency anemia Acute on chronic severe iron deficiency anemia ( unresponsive to oral iron therapy) : Hospitalization 02/20/2015 to 02/26/2015 (Hb 5.5 angiodysplasia of colon with hemorrhage , CHF due to PAH, IDA, DM2, CKD-3) 5 Units of PRBC given. Colonoscopy 02/24/2015: numerous AVMs Undergone ablation Hospitalized 06/19/2015 to 06/27/2015: Active GI bleeding  Prior IV iron treatments: 05/04/2014 2, 12/24/2014 2, 01/29/2015 2, 02/25/2015 1, 05/02/15 x 2  Plan: 1. CBC  checked today and I recommended to transfuse IV iron feraheme 510 mg. She has transportation issues 2. Follow-up with GI and cardiology  Return to clinic in 2 months for follow-up with labs and will block time for IV iron infusion.   No orders of the defined types were placed in this encounter.   The patient has a good understanding of the overall plan. she agrees with it. she will call with any problems that may develop before the next visit here.   Rulon Eisenmenger,  MD 06/30/2015

## 2015-06-30 NOTE — Telephone Encounter (Signed)
Patient aware and voiced understanding Add on 4/20 @ 9

## 2015-07-01 DIAGNOSIS — I5032 Chronic diastolic (congestive) heart failure: Secondary | ICD-10-CM | POA: Diagnosis not present

## 2015-07-01 DIAGNOSIS — D509 Iron deficiency anemia, unspecified: Secondary | ICD-10-CM | POA: Diagnosis not present

## 2015-07-01 DIAGNOSIS — I130 Hypertensive heart and chronic kidney disease with heart failure and stage 1 through stage 4 chronic kidney disease, or unspecified chronic kidney disease: Secondary | ICD-10-CM | POA: Diagnosis not present

## 2015-07-01 DIAGNOSIS — E039 Hypothyroidism, unspecified: Secondary | ICD-10-CM | POA: Diagnosis not present

## 2015-07-01 DIAGNOSIS — N183 Chronic kidney disease, stage 3 (moderate): Secondary | ICD-10-CM | POA: Diagnosis not present

## 2015-07-01 DIAGNOSIS — K219 Gastro-esophageal reflux disease without esophagitis: Secondary | ICD-10-CM | POA: Diagnosis not present

## 2015-07-01 DIAGNOSIS — I480 Paroxysmal atrial fibrillation: Secondary | ICD-10-CM | POA: Diagnosis not present

## 2015-07-01 DIAGNOSIS — E1122 Type 2 diabetes mellitus with diabetic chronic kidney disease: Secondary | ICD-10-CM | POA: Diagnosis not present

## 2015-07-01 DIAGNOSIS — E1143 Type 2 diabetes mellitus with diabetic autonomic (poly)neuropathy: Secondary | ICD-10-CM | POA: Diagnosis not present

## 2015-07-01 DIAGNOSIS — I272 Other secondary pulmonary hypertension: Secondary | ICD-10-CM | POA: Diagnosis not present

## 2015-07-01 DIAGNOSIS — K922 Gastrointestinal hemorrhage, unspecified: Secondary | ICD-10-CM | POA: Diagnosis not present

## 2015-07-01 DIAGNOSIS — K3184 Gastroparesis: Secondary | ICD-10-CM | POA: Diagnosis not present

## 2015-07-01 DIAGNOSIS — E1140 Type 2 diabetes mellitus with diabetic neuropathy, unspecified: Secondary | ICD-10-CM | POA: Diagnosis not present

## 2015-07-02 ENCOUNTER — Encounter: Payer: Self-pay | Admitting: Internal Medicine

## 2015-07-02 ENCOUNTER — Ambulatory Visit (INDEPENDENT_AMBULATORY_CARE_PROVIDER_SITE_OTHER): Payer: Medicare Other | Admitting: Internal Medicine

## 2015-07-02 VITALS — BP 97/42 | HR 64 | Temp 98.2°F | Ht 64.0 in | Wt 157.9 lb

## 2015-07-02 DIAGNOSIS — Z09 Encounter for follow-up examination after completed treatment for conditions other than malignant neoplasm: Secondary | ICD-10-CM | POA: Diagnosis not present

## 2015-07-02 DIAGNOSIS — K31811 Angiodysplasia of stomach and duodenum with bleeding: Secondary | ICD-10-CM | POA: Diagnosis not present

## 2015-07-02 DIAGNOSIS — L8991 Pressure ulcer of unspecified site, stage 1: Secondary | ICD-10-CM

## 2015-07-02 DIAGNOSIS — L8990 Pressure ulcer of unspecified site, unspecified stage: Secondary | ICD-10-CM

## 2015-07-02 DIAGNOSIS — I5032 Chronic diastolic (congestive) heart failure: Secondary | ICD-10-CM | POA: Diagnosis not present

## 2015-07-02 MED ORDER — HYDROXYZINE HCL 10 MG PO TABS: 10.0000 mg | ORAL_TABLET | Freq: Two times a day (BID) | ORAL | Status: DC | PRN

## 2015-07-02 NOTE — Assessment & Plan Note (Signed)
Pt has restarted her torsemide since discharge but has not started back her metolazone.  Her weight is up today but has f/u with HF clinic tomorrow. -f/u with HF clinic on 4/20 -discuss resuming metolazone tomorrow with HF clinic

## 2015-07-02 NOTE — Assessment & Plan Note (Signed)
Pt has pressure ulcer (minimal stage 1) which appears to be healing.  She has been putting santyl on it.  No erythema, warmth, or drainage.  -cont current regimen

## 2015-07-02 NOTE — Patient Instructions (Signed)
Thank you for your visit today.   Please return to the internal medicine clinic in 1 months (already scheduled). Please follow up with cardiology tomorrow. We will check your hemoglobin today. I will give you some hydroxyzine for itching.    Please be sure to bring all of your medications with you to every visit; this includes herbal supplements, vitamins, eye drops, and any over-the-counter medications.   Should you have any questions regarding your medications and/or any new or worsening symptoms, please be sure to call the clinic at [phone].   If you believe that you are suffering from a life threatening condition or one that may result in the loss of limb or function, then you should call 911 and proceed to the nearest Emergency Department.   A healthy lifestyle and preventative care can promote health and wellness.   Maintain regular health, dental, and eye exams.  Eat a healthy diet. Foods like vegetables, fruits, whole grains, low-fat dairy products, and lean protein foods contain the nutrients you need without too many calories. Decrease your intake of foods high in solid fats, added sugars, and salt. Get information about a proper diet from your caregiver, if necessary.  Regular physical exercise is one of the most important things you can do for your health. Most adults should get at least 150 minutes of moderate-intensity exercise (any activity that increases your heart rate and causes you to sweat) each week. In addition, most adults need muscle-strengthening exercises on 2 or more days a week.   Maintain a healthy weight. The body mass index (BMI) is a screening tool to identify possible weight problems. It provides an estimate of body fat based on height and weight. Your caregiver can help determine your BMI, and can help you achieve or maintain a healthy weight. For adults 20 years and older:  A BMI below 18.5 is considered underweight.  A BMI of 18.5 to 24.9 is  normal.  A BMI of 25 to 29.9 is considered overweight.  A BMI of 30 and above is considered obese.

## 2015-07-02 NOTE — Assessment & Plan Note (Addendum)
Pt presents for hospital f/u following a GI bleed found to have an ulcerated AVM in the proximal jejunum which was ablated and banded.  She received 3 units of prbcs during her hospitalization.  Hgb on discharge 8.8.  No additional bleeding since discharge.  -repeat cbc -f/u with GI (did not see appt so referral made)

## 2015-07-02 NOTE — Progress Notes (Signed)
Patient ID: [name], female   DOB: 09-May-1950, 65 y.o.   MRN: [id number]     Subjective:   Patient ID: [name] female    DOB: 01-05-1951 65 y.o.    MRN: [id number] Health Maintenance Due: Health Maintenance Due  Topic Date Due  . Hepatitis C Screening  05-06-1950  . PAP SMEAR  09/24/1972  . URINE MICROALBUMIN  09/26/2010  . ZOSTAVAX  09/25/2011  . MAMMOGRAM  11/23/2014  . PNEUMOCOCCAL POLYSACCHARIDE VACCINE (2) 03/16/2015    _________________________________________________  HPI: [name] is a 65 y.o. female here for a hospital f/u. Pt has a PMH outlined below.  Please see problem-based charting assessment and plan for further status of patient's chronic medical problems addressed at today's visit.  PMH: Past Medical History  Diagnosis Date  . 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 GERD (gastroesophageal reflux disease)   . 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 Unspecified essential hypertension   . Gastroparesis   . Sickle cell trait ([place])   . Obesity   . Scleroderma ([place])   . Trichomonas   . Peripheral neuropathy ([place])   . Type II diabetes mellitus ([facility])     a. diet control   . Arthritis   . Chronic kidney disease   . Gout   . Hypothyroidism     a. may be due to amiodarone use. started on synthroid on 12/2014 admission   . Anemia     a.  hx of GI bleed and duodenal AVMs  . Chronic diastolic CHF (congestive heart failure) ([place])     a. Echo 12/23/14 withEF 60-65%, moderately dilated RV, PASP 72, no pericardial effusion.  Marland Kitchen PAF (paroxysmal atrial fibrillation) ([facility])     a. not a long term AC candidate due to GI bleeds  . Angiodysplasia of colon with hemorrhage     Medications: Current Outpatient Prescriptions on File Prior to Visit  Medication Sig Dispense Refill  . ADCIRCA 20 MG TABS Take 20 mg by mouth daily.    Marland Kitchen ambrisentan (LETAIRIS) 10 MG tablet Take 1 tablet (10 mg total) by mouth daily. 30 tablet 11  . amiodarone  (PACERONE) 200 MG tablet Take 1 tablet (200 mg total) by mouth daily. 30 tablet 6  . collagenase (SANTYL) ointment Apply topically daily. 15 g 2  . esomeprazole (NEXIUM) 40 MG capsule Take 40 mg by mouth daily at 12 noon.     . fenofibrate 54 MG tablet Take 1 tablet (54 mg total) by mouth daily. 30 tablet 12  . FLUoxetine (PROZAC) 20 MG capsule Take 1 capsule (20 mg total) by mouth every morning. 30 capsule 5  . HYDROcodone-acetaminophen (NORCO) 10-325 MG tablet Take 1 tablet by mouth every 6 (six) hours as needed. pain  0  . levothyroxine (SYNTHROID, LEVOTHROID) 25 MCG tablet Take 1 tablet (25 mcg total) by mouth daily before breakfast. 30 tablet 11  . nortriptyline (PAMELOR) 10 MG capsule Take 2 capsules (20 mg total) by mouth at bedtime. 60 capsule 8  . pregabalin (LYRICA) 150 MG capsule Take 1 capsule (150 mg total) by mouth 3 (three) times daily. 90 capsule 5  . Selexipag (UPTRAVI) 1600 MCG TABS Take 1,600 mcg by mouth 2 (two) times daily. 60 tablet 3   No current facility-administered medications on file prior to visit.    Allergies: Allergies  Allergen Reactions  . Cephalexin Rash  . Ciprofloxacin Rash  . Codeine Other (See Comments)  GI upset  . Contrast Media [Iodinated Diagnostic Agents] Hives  . Iohexol Hives     Code: HIVES, Desc: pt breaks out in large hives. needs full premeds, Onset Date: [id number]     FH: Family History  Problem Relation Age of Onset  . Heart disease Mother   . Diabetes Mother   . Diabetes Sister     SH: Social History   Social History  . Marital Status: Legally Separated    Spouse Name: N/A  . Number of Children: 2  . Years of Education: 11   Occupational History  . Umemployed   . Disability     Social History Main Topics  . Smoking status: Never Smoker   . Smokeless tobacco: Never Used  . Alcohol Use: No  . Drug Use: No  . Sexual Activity: No   Other Topics Concern  . None   Social History Narrative    FAMILY HISTORY:   Significant for coronary artery disease and diabetes   Patient lives at home with granddaughter.    Patient has 2 children.    Patient has 11 years of education.    Patient is on disability.    Patient is right handed.    Patient is separated.      Review of Systems: Constitutional: Negative for fever, chills.  Eyes: Negative for blurred vision.  Respiratory: Negative for cough and shortness of breath.  Cardiovascular: Negative for chest pain.  Gastrointestinal: Negative for nausea, vomiting. Neurological: Negative for dizziness.   Objective:   Vital Signs: Filed Vitals:   07/02/15 1058  BP: 97/42  Pulse: 64  Temp: 98.2 F (36.8 C)  TempSrc: Oral  Height: 5' 4" (1.626 m)  Weight: 157 lb 14.4 oz (71.623 kg)  SpO2: 94%      BP Readings from Last 3 Encounters:  07/02/15 97/42  06/30/15 99/44  06/23/15 88/51    Physical Exam: Constitutional: Vital signs reviewed.  Patient is in NAD and cooperative with exam.  Head: Normocephalic and atraumatic. Eyes: EOMI, conjunctivae nl, no scleral icterus.  Neck: Supple. Cardiovascular: RRR, no MRG. Pulmonary/Chest: normal effort, CTAB. Abdominal: Soft. NT/ND +BS. Neurological: A&O x3, cranial nerves II-XII are grossly intact, moving all extremities. Extremities: 1+LE edema b/l.  Skin: Warm, dry and intact.    Assessment & Plan:   Assessment and plan was discussed and formulated with my attending.

## 2015-07-02 NOTE — Progress Notes (Signed)
Medicine attending: Medical history, presenting problems, physical findings, and medications, reviewed with resident physician Dr Loyal Jacobson  on the day of the patient visit and I concur with her evaluation and management plan.

## 2015-07-03 ENCOUNTER — Ambulatory Visit (HOSPITAL_COMMUNITY)
Admission: RE | Admit: 2015-07-03 | Discharge: 2015-07-03 | Disposition: A | Discharge status: Home / Self Care | Payer: Medicare Other | Source: Ambulatory Visit | Attending: Cardiology | Admitting: Cardiology

## 2015-07-03 ENCOUNTER — Telehealth: Payer: Self-pay | Admitting: Cardiology

## 2015-07-03 ENCOUNTER — Encounter (HOSPITAL_COMMUNITY): Payer: Self-pay

## 2015-07-03 VITALS — BP 108/56 | HR 64 | Wt 157.5 lb

## 2015-07-03 DIAGNOSIS — I5032 Chronic diastolic (congestive) heart failure: Secondary | ICD-10-CM | POA: Diagnosis not present

## 2015-07-03 DIAGNOSIS — I509 Heart failure, unspecified: Secondary | ICD-10-CM

## 2015-07-03 DIAGNOSIS — I2721 Secondary pulmonary arterial hypertension: Secondary | ICD-10-CM

## 2015-07-03 DIAGNOSIS — I5033 Acute on chronic diastolic (congestive) heart failure: Secondary | ICD-10-CM

## 2015-07-03 DIAGNOSIS — E1143 Type 2 diabetes mellitus with diabetic autonomic (poly)neuropathy: Secondary | ICD-10-CM | POA: Insufficient documentation

## 2015-07-03 DIAGNOSIS — E1122 Type 2 diabetes mellitus with diabetic chronic kidney disease: Secondary | ICD-10-CM | POA: Diagnosis not present

## 2015-07-03 DIAGNOSIS — M349 Systemic sclerosis, unspecified: Secondary | ICD-10-CM | POA: Diagnosis not present

## 2015-07-03 DIAGNOSIS — E039 Hypothyroidism, unspecified: Secondary | ICD-10-CM | POA: Diagnosis not present

## 2015-07-03 DIAGNOSIS — D573 Sickle-cell trait: Secondary | ICD-10-CM | POA: Diagnosis not present

## 2015-07-03 DIAGNOSIS — K3184 Gastroparesis: Secondary | ICD-10-CM | POA: Insufficient documentation

## 2015-07-03 DIAGNOSIS — Z8249 Family history of ischemic heart disease and other diseases of the circulatory system: Secondary | ICD-10-CM | POA: Diagnosis not present

## 2015-07-03 DIAGNOSIS — Z833 Family history of diabetes mellitus: Secondary | ICD-10-CM | POA: Diagnosis not present

## 2015-07-03 DIAGNOSIS — I480 Paroxysmal atrial fibrillation: Secondary | ICD-10-CM | POA: Insufficient documentation

## 2015-07-03 DIAGNOSIS — Z79899 Other long term (current) drug therapy: Secondary | ICD-10-CM | POA: Diagnosis not present

## 2015-07-03 DIAGNOSIS — I130 Hypertensive heart and chronic kidney disease with heart failure and stage 1 through stage 4 chronic kidney disease, or unspecified chronic kidney disease: Secondary | ICD-10-CM | POA: Insufficient documentation

## 2015-07-03 DIAGNOSIS — E785 Hyperlipidemia, unspecified: Secondary | ICD-10-CM | POA: Insufficient documentation

## 2015-07-03 DIAGNOSIS — I50810 Right heart failure, unspecified: Secondary | ICD-10-CM

## 2015-07-03 DIAGNOSIS — N183 Chronic kidney disease, stage 3 unspecified: Secondary | ICD-10-CM

## 2015-07-03 DIAGNOSIS — I272 Other secondary pulmonary hypertension: Secondary | ICD-10-CM

## 2015-07-03 DIAGNOSIS — K219 Gastro-esophageal reflux disease without esophagitis: Secondary | ICD-10-CM | POA: Diagnosis not present

## 2015-07-03 DIAGNOSIS — D509 Iron deficiency anemia, unspecified: Secondary | ICD-10-CM

## 2015-07-03 LAB — CBC
HEMATOCRIT: 26.2 % — AB (ref 34.0–46.6)
Hemoglobin: 8.6 g/dL — ABNORMAL LOW (ref 11.1–15.9)
MCH: 27.1 pg (ref 26.6–33.0)
MCHC: 32.8 g/dL (ref 31.5–35.7)
MCV: 83 fL (ref 79–97)
Platelets: 363 10*3/uL (ref 150–379)
RBC: 3.17 x10E6/uL — ABNORMAL LOW (ref 3.77–5.28)
RDW: 16.9 % — AB (ref 12.3–15.4)
WBC: 6.9 10*3/uL (ref 3.4–10.8)

## 2015-07-03 MED ORDER — FUROSEMIDE 10 MG/ML IJ SOLN
80.0000 mg | Freq: Once | INTRAMUSCULAR | Status: AC
  Administered 2015-07-03: 80 mg via INTRAVENOUS
  Filled 2015-07-03: qty 8

## 2015-07-03 MED ORDER — POTASSIUM CHLORIDE CRYS ER 20 MEQ PO TBCR
20.0000 meq | EXTENDED_RELEASE_TABLET | Freq: Once | ORAL | Status: AC
  Administered 2015-07-03: 20 meq via ORAL
  Filled 2015-07-03: qty 1

## 2015-07-03 MED ORDER — METOLAZONE 2.5 MG PO TABS: 2.5000 mg | ORAL_TABLET | ORAL | Status: DC

## 2015-07-03 NOTE — Progress Notes (Signed)
Patient ID: [name], female   DOB: 08-May-1950, 65 y.o.   MRN: [id number]    Advanced Heart Failure Clinic Note   Pulmonary: Dr Lamonte Sakai  HF: Dr Aundra Dubin  HPI: [name] is a 65 yo female with a history of DM2, HLD, HTN, pHTN, GERD, gastroparesis, scleroderma, DJD and diastolic HF.   Of note she has been on several PAH targeted therapies and coumadin over last 9 years, but has not been reliably on therapy due to noncompliance and inability to have labs performed. She has had best response to Tyvaso (+ bosentan) with an improvement in 6 minute walk and in PASP from 102 mmHg (7/09) to 42 mmHg (9/10). After stopping Tyvaso her PASP rose to 80's by TTE 6/13 and 11/14. Surprisingly, she has never required supplemental O2. Macitentan was started in 11/14. She developed nausea and diarrhea and macitentan was stopped early 3/15. Most recently, she had been on Letairis and Adcirca (followed by Dr. Lamonte Sakai).   Admitted 10/14-10/18/15 for A/C RHF, volume overload and syncope. Diuresed with IV lasix and milrinone a total of 26 lbs. Discharge weight was 156 lbs and started on lasix 40 mg BID.   Admitted 12/29 with dyspnea and chest pain. BP was low, and Lasix and Adcirca were both initially held. Echo showed EF 60-65% with moderately dilated and dysfunctional RV, PASP 111 mmHg with moderate TR. There was a large pericardial effusion without definite tamponade. Presentation was suspected to be due to progressive PAH with secondary RV failure rather than tamponade. No pericardiocentesis yet. She was started on milrinone gtt and diuresed. Shorter-acting sildenafil was started instead of Adcirca. Also had GI bleed. 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 Discharge weight was 146 pounds.   She was admitted in 2/16 with acute on chronic diastolic CHF/RV failure and hypotension.  Echo (2/16) showed EF 60%, moderate-severe RV dilation with moderate-severe RV systolic  dysfunction, PA systolic pressure 62 mmHg, large pericardial effusion with borderline tamponade.  She had pericardiocentesis with improvement in symptoms.  No malignant cells in pericardial fluid.  Repeat echo showed no pericardial effusion.  She developed paroxysmal atrial fibrillation on 2/19 but went back into NSR on amiodarone.  She was not anticoagulated given history of GI bleeding and pericardial effusion.   She was again admitted in 10/16.  She had been out of meds x 1 week and gained 20 lbs.  She also was noted to be severely anemic with iron deficiency.  She was heme negative.  She got 2 units PRBCs.  She was diuresed extensively.  She was started on Levoxyl for hypothyroidism.   Admitted In December 2016  with GI bleed. Multiple AVMs noted with 10-15 ablations completed. PAH meds continued. Diuretics cut back --torsemide 40 mg daily. Discharge weight was 138  pounds.   She was admitted again in early 4/17 with recurrent GI bleed.  She had enteroscopy with clipping of small bowel AVM.  She was take off her diuretics at that time.  She restarted torsemide eventually on her own accord at home but has not been on metolazone.   She has gained about 11 lbs since last appointment.  She fatigues easily, abdomen is distending and she is picking up fluid in her legs.  She has been short of breath walking around her house and is short of breath walking in the office today.  No further BRBPR or melena.   6 min walk (05/16/14): 125 m 6 min  walk (6/16): 146 m 6 min walk (8/16): 183 m 6 min walk (1/17): 170 m (2 breaks, may have short changed her a bit - stable from last)  Echo (1/16) with EF 55-60%, severely dilated RA with moderately decreased systolic function, large pericardial effusion.  ECHO 05/09/2014: EF 60-65% Peak PA pressure 52  ECHO 06/05/14: EF 55-60%, RV moderately dilated with moderately decreased systolic function with D-shaped interventricular septum. Severe RAE. RVSP 59. No pericardial  effusion. Echo (8/16): EF 60-65%, moderate-severe RV dilation with mildly decreased RV systolic function, D-shaped interventricular septum, PASP 92, severe biatrial enlargement, no pericardial effusion.  Echo (10/16): EF 60-65%, D-shaped septum, RV moderately dilated, PASP 76 mmHg, severe TR.  ABIs (11/16): Normal  RHC (1/16):  Hemodynamics (mmHg) RA mean 12 RV 64/14 PA 68/29, mean 42 PCWP mean 12 LV 102/14 AO 105/53 PA 67% AO 93% Cardiac Output (Fick) 6.63  Cardiac Index (Fick) 3.88  PVR 4.5 WU  Labs 03/24/2014 K 3.8 Creatinine 1.51 Hgb 9.3  Labs 2/16 BNP 121, K 4.4, creatinine 1.27, hgb 9.2 Labs 2/16 K 3.8, creatinine 1.58, AST 45, ALT 12 Labs 05/22/2014: Creatinine 2.1 K 3.7  Labs 06/05/2014: Creatinine 1.5 K 3.7, LFTs normal Labs 6/16: K 4.4, creatinine 1.82, hgb 8.8 Labs 10/16: K 4.4, creatinine 1.77 => 1.64, hgb 7.9 => 8.4, LFTs normal, Mg 1.6 Labs 01/21/2015: K 3.8 Creatinine 1.67  Labs 02/2015: K 4.3 Creatinine 1.72 => 2.4 Hgb 8.5  Labs 4/17: hgb 8.6, creatinine 1.8, LFTs normal  ECG: NSR, low voltage, 1st degree AV block.  ROS: All systems negative except as listed in HPI, PMH and Problem List.  SH:  Social History   Social History  . Marital Status: Legally Separated    Spouse Name: N/A  . Number of Children: 2  . Years of Education: 11   Occupational History  . Umemployed   . Disability     Social History Main Topics  . Smoking status: Never Smoker   . Smokeless tobacco: Never Used  . Alcohol Use: No  . Drug Use: No  . Sexual Activity: No   Other Topics Concern  . Not on file   Social History Narrative    FAMILY HISTORY:  Significant for coronary artery disease and diabetes   Patient lives at home with granddaughter.    Patient has 2 children.    Patient has 11 years of education.    Patient is on disability.    Patient is right handed.    Patient is separated.      FH:  Family History  Problem Relation Age of Onset  . Heart disease Mother    . Diabetes Mother   . Diabetes Sister     Past Medical History  Diagnosis Date  . Secondary pulmonary hypertension ([place])     a. 2/2 scleroderma  . GERD (gastroesophageal reflux disease)   . Systemic sclerosis ([place])   . Unspecified essential hypertension   . Gastroparesis   . Sickle cell trait ([place])   . Obesity   . Scleroderma ([place])   . Trichomonas   . Peripheral neuropathy ([place])   . Type II diabetes mellitus ([facility])     a. diet control   . Arthritis   . Chronic kidney disease   . Gout   . Hypothyroidism     a. may be due to amiodarone use. started on synthroid on 12/2014 admission   . Anemia     a.  hx of GI bleed and duodenal  AVMs  . Chronic diastolic CHF (congestive heart failure) ([place])     a. Echo 12/23/14 withEF 60-65%, moderately dilated RV, PASP 72, no pericardial effusion.  Marland Kitchen PAF (paroxysmal atrial fibrillation) ([facility])     a. not a long term AC candidate due to GI bleeds  . Angiodysplasia of colon with hemorrhage     Current Outpatient Prescriptions  Medication Sig Dispense Refill  . ambrisentan (LETAIRIS) 10 MG tablet Take 1 tablet (10 mg total) by mouth daily. 30 tablet 11  . amiodarone (PACERONE) 200 MG tablet Take 1 tablet (200 mg total) by mouth daily. 30 tablet 6  . collagenase (SANTYL) ointment Apply topically daily. 15 g 2  . esomeprazole (NEXIUM) 40 MG capsule Take 40 mg by mouth daily at 12 noon.     . fenofibrate 54 MG tablet Take 1 tablet (54 mg total) by mouth daily. 30 tablet 12  . FLUoxetine (PROZAC) 20 MG capsule Take 1 capsule (20 mg total) by mouth every morning. 30 capsule 5  . HYDROcodone-acetaminophen (NORCO) 10-325 MG tablet Take 1 tablet by mouth every 6 (six) hours as needed. pain  0  . hydrOXYzine (ATARAX/VISTARIL) 10 MG tablet Take 1 tablet (10 mg total) by mouth 2 (two) times daily as needed for itching. 20 tablet 0  . levothyroxine (SYNTHROID, LEVOTHROID) 25 MCG tablet Take 1 tablet (25 mcg total) by mouth daily before breakfast. 30  tablet 11  . nortriptyline (PAMELOR) 10 MG capsule Take 2 capsules (20 mg total) by mouth at bedtime. 60 capsule 8  . potassium chloride SA (K-DUR,KLOR-CON) 20 MEQ tablet Take 40 mEq by mouth daily.    . pregabalin (LYRICA) 150 MG capsule Take 1 capsule (150 mg total) by mouth 3 (three) times daily. 90 capsule 5  . Riociguat (ADEMPAS) 1 MG TABS Take 1 mg by mouth 3 (three) times daily.    . Selexipag (UPTRAVI) 1600 MCG TABS Take 1,600 mcg by mouth 2 (two) times daily. 60 tablet 3  . torsemide (DEMADEX) 20 MG tablet Take 60 mg by mouth 2 (two) times daily.    . metolazone (ZAROXOLYN) 2.5 MG tablet Take 1 tablet (2.5 mg total) by mouth 2 (two) times a week. Every Monday and Friday 90 tablet 3   No current facility-administered medications for this encounter.    Filed Vitals:   07/03/15 0909  BP: 108/56  Pulse: 64  Weight: 157 lb 8 oz (71.442 kg)  SpO2: 100%   Wt Readings from Last 3 Encounters:  07/03/15 157 lb 8 oz (71.442 kg)  07/02/15 157 lb 14.4 oz (71.623 kg)  06/30/15 155 lb 3.2 oz (70.398 kg)     PHYSICAL EXAM: General: Elderly and fatigued appearing. Sister present.  Neck: JVP 14 cm with mild HJR. No thyromegaly or thyroid nodule. Lungs: diminished, bilateral crackles in bases. CV: RV heave. Heart regular S1/S2 with widely split S2,  3/6 HSM LLSB. R and LLE 2+ edema to knees.   Unable to palpate pedal pulses.  Abdomen: soft, NT, moderately distended, no HSM. No bruits or masses. +BS  Neurologic: Alert and oriented x 3.  Psych: Affect flat. Extremities: No clubbing or cyanosis.   ASSESSMENT & PLAN: 1. 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  Echo 10/16  EF 60-65% with moderate dilated and mildly dysfunctional RV, severe TR. Pericardial effusion had resolved. NYHA class IV symptoms.  She is very volume overloaded on exam.  She was taken off her diuretics after recent admission  with GI bleeding but has started back on torsemide.  She is not  taking metolazone.  - I will give a dose of Lasix 80 mg IV x 1 in clinic today with KCl 20 mEq.  - Continue torsemide 60 mg bid.  - I will have her take metolazone 2.5 mg tomorrow with am torsemide, then continue taking metolazone 2.5 mg every Monday and Friday.   - Take an extra KCl 20 on metolazone days.  - BMET/BNP today in 1 week.  2. PAH with cor pulmonale: Severe PAH.  Had last [place] in 1/16. Pulmonary pressure remains elevated by echo in 10/16. - Continue ambrisentan 10 mg daily.  - Continue selexipag 1600 mcg bid.  - She is now on riociguat 1 mg tid.   - Will do 6 minute walk when her volume status is improved.  3. CKD stage III:  - Repeat BMET in 1 week.  4. 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  She is no longer anticoagulated.  5. Pericardial effusion: Resolved after pericardiocentesis, not seen on echo 12/2014.  ECG is low voltage, possible that it could have returned.  6. Atrial fibrillation: Paroxysmal. NSR today.  - Continue amiodarone.  Recent LFTs normal.  She will get a yearly eye exam while on amiodarone.  - No anticoagulation with h/o GI bleeding.  7.  Hypothyroidism: May be related to amiodarone.   - On Levoxyl 25 mcg daily.    She will need followup in 1 week.  [name] 07/03/2015

## 2015-07-03 NOTE — Telephone Encounter (Signed)
[name] is calling because they admitted this patient [facility] their [facility] late yesterday evening and want to know if there is anything that they need to follow up on after her visit today with Dr. Aundra Dubin .  Thanks

## 2015-07-03 NOTE — Patient Instructions (Signed)
Take Metolazone 2.5 mg tomorrow AM, then TAKE it EVERY Monday and Friday  Continue taking Torsemide 60 mg (3 tabs) Twice daily   Your physician recommends that you schedule a follow-up appointment in: 1 week

## 2015-07-03 NOTE — Telephone Encounter (Signed)
Spoke w/Tammy, gave her update on pt, she will see her tomorrow

## 2015-07-03 NOTE — Progress Notes (Signed)
Patient seen in CHF clinic for volume overload. IV lasix administered 80 mg x 1 push through PIV 22g in [name] x 1 attempt per Dr. Aundra Dubin VO. Patient tolerated well.  Total UOP: 750cc clear yellow nonodorous urine.  PIV DCd and clean dry gauze bandage applied to site before patient DCd from CHF clinic.  [name]

## 2015-07-04 ENCOUNTER — Telehealth: Payer: Self-pay | Admitting: *Deleted

## 2015-07-04 DIAGNOSIS — K3184 Gastroparesis: Secondary | ICD-10-CM | POA: Diagnosis not present

## 2015-07-04 DIAGNOSIS — E1143 Type 2 diabetes mellitus with diabetic autonomic (poly)neuropathy: Secondary | ICD-10-CM | POA: Diagnosis not present

## 2015-07-04 DIAGNOSIS — D509 Iron deficiency anemia, unspecified: Secondary | ICD-10-CM | POA: Diagnosis not present

## 2015-07-04 DIAGNOSIS — I130 Hypertensive heart and chronic kidney disease with heart failure and stage 1 through stage 4 chronic kidney disease, or unspecified chronic kidney disease: Secondary | ICD-10-CM | POA: Diagnosis not present

## 2015-07-04 DIAGNOSIS — N183 Chronic kidney disease, stage 3 (moderate): Secondary | ICD-10-CM | POA: Diagnosis not present

## 2015-07-04 DIAGNOSIS — K922 Gastrointestinal hemorrhage, unspecified: Secondary | ICD-10-CM | POA: Diagnosis not present

## 2015-07-04 DIAGNOSIS — K219 Gastro-esophageal reflux disease without esophagitis: Secondary | ICD-10-CM | POA: Diagnosis not present

## 2015-07-04 DIAGNOSIS — I5032 Chronic diastolic (congestive) heart failure: Secondary | ICD-10-CM | POA: Diagnosis not present

## 2015-07-04 DIAGNOSIS — E1122 Type 2 diabetes mellitus with diabetic chronic kidney disease: Secondary | ICD-10-CM | POA: Diagnosis not present

## 2015-07-04 DIAGNOSIS — E039 Hypothyroidism, unspecified: Secondary | ICD-10-CM | POA: Diagnosis not present

## 2015-07-04 DIAGNOSIS — I272 Other secondary pulmonary hypertension: Secondary | ICD-10-CM | POA: Diagnosis not present

## 2015-07-04 DIAGNOSIS — E1140 Type 2 diabetes mellitus with diabetic neuropathy, unspecified: Secondary | ICD-10-CM | POA: Diagnosis not present

## 2015-07-04 DIAGNOSIS — I480 Paroxysmal atrial fibrillation: Secondary | ICD-10-CM | POA: Diagnosis not present

## 2015-07-08 DIAGNOSIS — K922 Gastrointestinal hemorrhage, unspecified: Secondary | ICD-10-CM | POA: Diagnosis not present

## 2015-07-08 DIAGNOSIS — E1140 Type 2 diabetes mellitus with diabetic neuropathy, unspecified: Secondary | ICD-10-CM | POA: Diagnosis not present

## 2015-07-08 DIAGNOSIS — E039 Hypothyroidism, unspecified: Secondary | ICD-10-CM | POA: Diagnosis not present

## 2015-07-08 DIAGNOSIS — K219 Gastro-esophageal reflux disease without esophagitis: Secondary | ICD-10-CM | POA: Diagnosis not present

## 2015-07-08 DIAGNOSIS — N183 Chronic kidney disease, stage 3 (moderate): Secondary | ICD-10-CM | POA: Diagnosis not present

## 2015-07-08 DIAGNOSIS — I272 Other secondary pulmonary hypertension: Secondary | ICD-10-CM | POA: Diagnosis not present

## 2015-07-08 DIAGNOSIS — I5032 Chronic diastolic (congestive) heart failure: Secondary | ICD-10-CM | POA: Diagnosis not present

## 2015-07-08 DIAGNOSIS — D509 Iron deficiency anemia, unspecified: Secondary | ICD-10-CM | POA: Diagnosis not present

## 2015-07-08 DIAGNOSIS — E1143 Type 2 diabetes mellitus with diabetic autonomic (poly)neuropathy: Secondary | ICD-10-CM | POA: Diagnosis not present

## 2015-07-08 DIAGNOSIS — I130 Hypertensive heart and chronic kidney disease with heart failure and stage 1 through stage 4 chronic kidney disease, or unspecified chronic kidney disease: Secondary | ICD-10-CM | POA: Diagnosis not present

## 2015-07-08 DIAGNOSIS — K3184 Gastroparesis: Secondary | ICD-10-CM | POA: Diagnosis not present

## 2015-07-08 DIAGNOSIS — I480 Paroxysmal atrial fibrillation: Secondary | ICD-10-CM | POA: Diagnosis not present

## 2015-07-08 DIAGNOSIS — E1122 Type 2 diabetes mellitus with diabetic chronic kidney disease: Secondary | ICD-10-CM | POA: Diagnosis not present

## 2015-07-09 ENCOUNTER — Ambulatory Visit (HOSPITAL_COMMUNITY)
Admission: RE | Admit: 2015-07-09 | Discharge: 2015-07-09 | Disposition: A | Discharge status: Home / Self Care | Payer: Medicare Other | Source: Ambulatory Visit | Attending: Internal Medicine | Admitting: Internal Medicine

## 2015-07-09 VITALS — BP 92/50 | HR 67 | Wt 149.0 lb

## 2015-07-09 DIAGNOSIS — M349 Systemic sclerosis, unspecified: Secondary | ICD-10-CM | POA: Diagnosis not present

## 2015-07-09 DIAGNOSIS — I480 Paroxysmal atrial fibrillation: Secondary | ICD-10-CM | POA: Diagnosis not present

## 2015-07-09 DIAGNOSIS — E1122 Type 2 diabetes mellitus with diabetic chronic kidney disease: Secondary | ICD-10-CM | POA: Diagnosis not present

## 2015-07-09 DIAGNOSIS — Z79899 Other long term (current) drug therapy: Secondary | ICD-10-CM | POA: Insufficient documentation

## 2015-07-09 DIAGNOSIS — D573 Sickle-cell trait: Secondary | ICD-10-CM | POA: Diagnosis not present

## 2015-07-09 DIAGNOSIS — N183 Chronic kidney disease, stage 3 unspecified: Secondary | ICD-10-CM

## 2015-07-09 DIAGNOSIS — I272 Other secondary pulmonary hypertension: Secondary | ICD-10-CM | POA: Insufficient documentation

## 2015-07-09 DIAGNOSIS — Z833 Family history of diabetes mellitus: Secondary | ICD-10-CM | POA: Diagnosis not present

## 2015-07-09 DIAGNOSIS — E039 Hypothyroidism, unspecified: Secondary | ICD-10-CM | POA: Diagnosis not present

## 2015-07-09 DIAGNOSIS — Z8249 Family history of ischemic heart disease and other diseases of the circulatory system: Secondary | ICD-10-CM | POA: Diagnosis not present

## 2015-07-09 DIAGNOSIS — M109 Gout, unspecified: Secondary | ICD-10-CM | POA: Diagnosis not present

## 2015-07-09 DIAGNOSIS — I130 Hypertensive heart and chronic kidney disease with heart failure and stage 1 through stage 4 chronic kidney disease, or unspecified chronic kidney disease: Secondary | ICD-10-CM | POA: Insufficient documentation

## 2015-07-09 DIAGNOSIS — I509 Heart failure, unspecified: Secondary | ICD-10-CM | POA: Diagnosis not present

## 2015-07-09 DIAGNOSIS — I5032 Chronic diastolic (congestive) heart failure: Secondary | ICD-10-CM

## 2015-07-09 DIAGNOSIS — E785 Hyperlipidemia, unspecified: Secondary | ICD-10-CM | POA: Diagnosis not present

## 2015-07-09 DIAGNOSIS — E1142 Type 2 diabetes mellitus with diabetic polyneuropathy: Secondary | ICD-10-CM | POA: Diagnosis not present

## 2015-07-09 DIAGNOSIS — K219 Gastro-esophageal reflux disease without esophagitis: Secondary | ICD-10-CM | POA: Diagnosis not present

## 2015-07-09 DIAGNOSIS — I50810 Right heart failure, unspecified: Secondary | ICD-10-CM

## 2015-07-09 DIAGNOSIS — K3184 Gastroparesis: Secondary | ICD-10-CM | POA: Diagnosis not present

## 2015-07-09 DIAGNOSIS — E1143 Type 2 diabetes mellitus with diabetic autonomic (poly)neuropathy: Secondary | ICD-10-CM | POA: Insufficient documentation

## 2015-07-09 DIAGNOSIS — I2721 Secondary pulmonary arterial hypertension: Secondary | ICD-10-CM

## 2015-07-09 LAB — BASIC METABOLIC PANEL
Anion gap: 14 (ref 5–15)
BUN: 59 mg/dL — AB (ref 6–20)
CO2: 23 mmol/L (ref 22–32)
CREATININE: 1.68 mg/dL — AB (ref 0.44–1.00)
Calcium: 8.5 mg/dL — ABNORMAL LOW (ref 8.9–10.3)
Chloride: 107 mmol/L (ref 101–111)
GFR, EST AFRICAN AMERICAN: 36 mL/min — AB (ref >60)
GFR, EST NON AFRICAN AMERICAN: 31 mL/min — AB (ref >60)
Glucose, Bld: 78 mg/dL (ref 65–99)
POTASSIUM: 3.9 mmol/L (ref 3.5–5.1)
SODIUM: 144 mmol/L (ref 135–145)

## 2015-07-09 NOTE — Patient Instructions (Signed)
Your physician recommends that you schedule a follow-up appointment as scheduled   Do the following things EVERYDAY: 1) Weigh yourself in the morning before breakfast. Write it down and keep it in a log. 2) Take your medicines as prescribed 3) Eat low salt foods-Limit salt (sodium) to 2000 mg per day.  4) Stay as active as you can everyday 5) Limit all fluids for the day to less than 2 liters 6)

## 2015-07-09 NOTE — Progress Notes (Signed)
Patient ID: [name], female   DOB: [birth year], 65 y.o.   MRN: [id number]    Advanced Heart Failure Clinic Note   Pulmonary: Dr Lamonte Sakai  HF: Dr Aundra Dubin  HPI: [name] is a 65 yo female with a history of DM2, HLD, HTN, pHTN, GERD, gastroparesis, scleroderma, DJD and diastolic HF.   Of note she has been on several PAH targeted therapies and coumadin over last 9 years, but has not been reliably on therapy due to noncompliance and inability to have labs performed. She has had best response to Tyvaso (+ bosentan) with an improvement in 6 minute walk and in PASP from 102 mmHg (7/09) to 42 mmHg (9/10). After stopping Tyvaso her PASP rose to 80's by TTE 6/13 and 11/14. Surprisingly, she has never required supplemental O2. Macitentan was started in 11/14. She developed nausea and diarrhea and macitentan was stopped early 3/15. Most recently, she had been on Letairis and Adcirca (followed by Dr. Lamonte Sakai).   Admitted 10/14-10/18/15 for A/C RHF, volume overload and syncope. Diuresed with IV lasix and milrinone a total of 26 lbs. Discharge weight was 156 lbs and started on lasix 40 mg BID.   Admitted 12/29 with dyspnea and chest pain. BP was low, and Lasix and Adcirca were both initially held. Echo showed EF 60-65% with moderately dilated and dysfunctional RV, PASP 111 mmHg with moderate TR. There was a large pericardial effusion without definite tamponade. Presentation was suspected to be due to progressive PAH with secondary RV failure rather than tamponade. No pericardiocentesis yet. She was started on milrinone gtt and diuresed. Shorter-acting sildenafil was started instead of Adcirca. Also had GI bleed. 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 Discharge weight was 146 pounds.   She was admitted in 2/16 with acute on chronic diastolic CHF/RV failure and hypotension.  Echo (2/16) showed EF 60%, moderate-severe RV dilation with moderate-severe RV systolic  dysfunction, PA systolic pressure 62 mmHg, large pericardial effusion with borderline tamponade.  She had pericardiocentesis with improvement in symptoms.  No malignant cells in pericardial fluid.  Repeat echo showed no pericardial effusion.  She developed paroxysmal atrial fibrillation on 2/19 but went back into NSR on amiodarone.  She was not anticoagulated given history of GI bleeding and pericardial effusion.   She was again admitted in 10/16.  She had been out of meds x 1 week and gained 20 lbs.  She also was noted to be severely anemic with iron deficiency.  She was heme negative.  She got 2 units PRBCs.  She was diuresed extensively.  She was started on Levoxyl for hypothyroidism.   Admitted In December 2016  with GI bleed. Multiple AVMs noted with 10-15 ablations completed. PAH meds continued. Diuretics cut back --torsemide 40 mg daily. Discharge weight was 138  pounds.   She was admitted again in early 4/17 with recurrent GI bleed.  She had enteroscopy with clipping of small bowel AVM.  She was take off her diuretics at that time.  She restarted torsemide eventually on her own accord at home but has not been on metolazone.   She returns for follow up. Last week she was volume overloaded and received 80 mg IV lasix. Weight at home down to 144 pounds. Overall feeling better. SOB with steps. Denies PND/Orthopnea. Denies dizziness/syncope. No BRBPR. Taking all medications.   6 min walk (05/16/14): 125 m 6 min walk (6/16): 146 m 6 min walk (8/16): 183 m 6 min walk (  1/17): 170 m (2 breaks, may have short changed her a bit - stable from last)  Echo (1/16) with EF 55-60%, severely dilated RA with moderately decreased systolic function, large pericardial effusion.  ECHO 05/09/2014: EF 60-65% Peak PA pressure 52  ECHO 06/05/14: EF 55-60%, RV moderately dilated with moderately decreased systolic function with D-shaped interventricular septum. Severe RAE. RVSP 59. No pericardial effusion. Echo (8/16): EF  60-65%, moderate-severe RV dilation with mildly decreased RV systolic function, D-shaped interventricular septum, PASP 92, severe biatrial enlargement, no pericardial effusion.  Echo (10/16): EF 60-65%, D-shaped septum, RV moderately dilated, PASP 76 mmHg, severe TR.  ABIs (11/16): Normal  RHC (1/16):  Hemodynamics (mmHg) RA mean 12 RV 64/14 PA 68/29, mean 42 PCWP mean 12 LV 102/14 AO 105/53 PA 67% AO 93% Cardiac Output (Fick) 6.63  Cardiac Index (Fick) 3.88  PVR 4.5 WU  Labs 03/24/2014 K 3.8 Creatinine 1.51 Hgb 9.3  Labs 2/16 BNP 121, K 4.4, creatinine 1.27, hgb 9.2 Labs 2/16 K 3.8, creatinine 1.58, AST 45, ALT 12 Labs 05/22/2014: Creatinine 2.1 K 3.7  Labs 06/05/2014: Creatinine 1.5 K 3.7, LFTs normal Labs 6/16: K 4.4, creatinine 1.82, hgb 8.8 Labs 10/16: K 4.4, creatinine 1.77 => 1.64, hgb 7.9 => 8.4, LFTs normal, Mg 1.6 Labs 01/21/2015: K 3.8 Creatinine 1.67  Labs 02/2015: K 4.3 Creatinine 1.72 => 2.4 Hgb 8.5  Labs 4/17: hgb 8.6, creatinine 1.8, LFTs normal  ECG: NSR, low voltage, 1st degree AV block.  ROS: All systems negative except as listed in HPI, PMH and Problem List.  SH:  Social History   Social History  . Marital Status: Legally Separated    Spouse Name: N/A  . Number of Children: 2  . Years of Education: 11   Occupational History  . Umemployed   . Disability     Social History Main Topics  . Smoking status: Never Smoker   . Smokeless tobacco: Never Used  . Alcohol Use: No  . Drug Use: No  . Sexual Activity: No   Other Topics Concern  . Not on file   Social History Narrative    FAMILY HISTORY:  Significant for coronary artery disease and diabetes   Patient lives at home with granddaughter.    Patient has 2 children.    Patient has 11 years of education.    Patient is on disability.    Patient is right handed.    Patient is separated.      FH:  Family History  Problem Relation Age of Onset  . Heart disease Mother   . Diabetes Mother   .  Diabetes Sister     Past Medical History  Diagnosis Date  . Secondary pulmonary hypertension ([place])     a. 2/2 scleroderma  . GERD (gastroesophageal reflux disease)   . Systemic sclerosis ([place])   . Unspecified essential hypertension   . Gastroparesis   . Sickle cell trait ([place])   . Obesity   . Scleroderma ([place])   . Trichomonas   . Peripheral neuropathy ([place])   . Type II diabetes mellitus ([facility])     a. diet control   . Arthritis   . Chronic kidney disease   . Gout   . Hypothyroidism     a. may be due to amiodarone use. started on synthroid on 12/2014 admission   . Anemia     a.  hx of GI bleed and duodenal AVMs  . Chronic diastolic CHF (congestive heart failure) ([place])  a. Echo 12/23/14 withEF 60-65%, moderately dilated RV, PASP 72, no pericardial effusion.  Marland Kitchen PAF (paroxysmal atrial fibrillation) ([facility])     a. not a long term AC candidate due to GI bleeds  . Angiodysplasia of colon with hemorrhage     Current Outpatient Prescriptions  Medication Sig Dispense Refill  . ambrisentan (LETAIRIS) 10 MG tablet Take 1 tablet (10 mg total) by mouth daily. 30 tablet 11  . amiodarone (PACERONE) 200 MG tablet Take 1 tablet (200 mg total) by mouth daily. 30 tablet 6  . esomeprazole (NEXIUM) 40 MG capsule Take 40 mg by mouth daily at 12 noon.     . fenofibrate 54 MG tablet Take 1 tablet (54 mg total) by mouth daily. 30 tablet 12  . FLUoxetine (PROZAC) 20 MG capsule Take 1 capsule (20 mg total) by mouth every morning. 30 capsule 5  . HYDROcodone-acetaminophen (NORCO) 10-325 MG tablet Take 1 tablet by mouth every 6 (six) hours as needed. pain  0  . hydrOXYzine (ATARAX/VISTARIL) 10 MG tablet Take 1 tablet (10 mg total) by mouth 2 (two) times daily as needed for itching. 20 tablet 0  . levothyroxine (SYNTHROID, LEVOTHROID) 25 MCG tablet Take 1 tablet (25 mcg total) by mouth daily before breakfast. 30 tablet 11  . metolazone (ZAROXOLYN) 2.5 MG tablet Take 1 tablet (2.5 mg total) by mouth 2  (two) times a week. Every Monday and Friday 90 tablet 3  . nortriptyline (PAMELOR) 10 MG capsule Take 2 capsules (20 mg total) by mouth at bedtime. 60 capsule 8  . potassium chloride SA (K-DUR,KLOR-CON) 20 MEQ tablet Take 40 mEq by mouth daily.    . pregabalin (LYRICA) 150 MG capsule Take 1 capsule (150 mg total) by mouth 3 (three) times daily. 90 capsule 5  . Riociguat (ADEMPAS) 1 MG TABS Take 1 mg by mouth 3 (three) times daily.    . Selexipag (UPTRAVI) 1600 MCG TABS Take 1,600 mcg by mouth 2 (two) times daily. 60 tablet 3  . torsemide (DEMADEX) 20 MG tablet Take 60 mg by mouth 2 (two) times daily.    . collagenase (SANTYL) ointment Apply topically daily. (Patient not taking: Reported on 07/09/2015) 15 g 2   No current facility-administered medications for this encounter.    Filed Vitals:   07/09/15 0927  BP: 92/50  Pulse: 67  Weight: 149 lb (67.586 kg)  SpO2: 96%   Wt Readings from Last 3 Encounters:  07/09/15 149 lb (67.586 kg)  07/03/15 157 lb 8 oz (71.442 kg)  07/02/15 157 lb 14.4 oz (71.623 kg)     PHYSICAL EXAM: General: Elderly and fatigued appearing. Sister present.  Neck: JVP 6-7 with mild HJR. No thyromegaly or thyroid nodule. Lungs: diminished, bilateral crackles in bases. CV: RV heave. Heart regular S1/S2 with widely split S2,  3/6 HSM LLSB. R and LLE trace  edema to knees.   Unable to palpate pedal pulses.  Abdomen: soft, NT, moderately distended, no HSM. No bruits or masses. +BS  Neurologic: Alert and oriented x 3.  Psych: Affect flat. Extremities: No clubbing or cyanosis.   ASSESSMENT & PLAN: 1. 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  Echo 10/16  EF 60-65% with moderate dilated and mildly dysfunctional RV, severe TR. Pericardial effusion had resolved. NYHA class III symptoms.   Volume status improved after IV lasix. Looks good today.  Continue torsemide 60 mg bid + 40 meq of K. Continue taking metolazone 2.5 mg every Monday and  Friday.   - Take an extra KCl 20 on metolazone days.  2. PAH with cor pulmonale: Severe PAH.  Had last [place] in 1/16. Pulmonary pressure remains elevated by echo in 10/16. - Continue ambrisentan 10 mg daily.  - Continue selexipag 1600 mcg bid.  - Continue riociguat 1 mg tid.   - Refused 6MW. Neds one next visit.   3. CKD stage III:  -BMET today.  4. 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  She is no longer anticoagulated.  5. Pericardial effusion: Resolved after pericardiocentesis, not seen on echo 12/2014.  ECG is low voltage, possible that it could have returned.  6. Atrial fibrillation: Paroxysmal. NSR today.  - Continue amiodarone.  Recent LFTs normal.  She will get a yearly eye exam while on amiodarone.  - No anticoagulation with h/o GI bleeding.  7.  Hypothyroidism: May be related to amiodarone.   - On Levoxyl 25 mcg daily.    Follow up May 16th with Dr Aundra Dubin. Refused 6MW today. Hopefully can get  6MW next appointment.   Titilayo Hagans NP-C  07/09/2015

## 2015-07-12 DIAGNOSIS — D509 Iron deficiency anemia, unspecified: Secondary | ICD-10-CM | POA: Diagnosis not present

## 2015-07-12 DIAGNOSIS — I130 Hypertensive heart and chronic kidney disease with heart failure and stage 1 through stage 4 chronic kidney disease, or unspecified chronic kidney disease: Secondary | ICD-10-CM | POA: Diagnosis not present

## 2015-07-12 DIAGNOSIS — I480 Paroxysmal atrial fibrillation: Secondary | ICD-10-CM | POA: Diagnosis not present

## 2015-07-12 DIAGNOSIS — N183 Chronic kidney disease, stage 3 (moderate): Secondary | ICD-10-CM | POA: Diagnosis not present

## 2015-07-12 DIAGNOSIS — K922 Gastrointestinal hemorrhage, unspecified: Secondary | ICD-10-CM | POA: Diagnosis not present

## 2015-07-12 DIAGNOSIS — K219 Gastro-esophageal reflux disease without esophagitis: Secondary | ICD-10-CM | POA: Diagnosis not present

## 2015-07-12 DIAGNOSIS — K3184 Gastroparesis: Secondary | ICD-10-CM | POA: Diagnosis not present

## 2015-07-12 DIAGNOSIS — E039 Hypothyroidism, unspecified: Secondary | ICD-10-CM | POA: Diagnosis not present

## 2015-07-12 DIAGNOSIS — E1143 Type 2 diabetes mellitus with diabetic autonomic (poly)neuropathy: Secondary | ICD-10-CM | POA: Diagnosis not present

## 2015-07-12 DIAGNOSIS — I5032 Chronic diastolic (congestive) heart failure: Secondary | ICD-10-CM | POA: Diagnosis not present

## 2015-07-12 DIAGNOSIS — E1140 Type 2 diabetes mellitus with diabetic neuropathy, unspecified: Secondary | ICD-10-CM | POA: Diagnosis not present

## 2015-07-12 DIAGNOSIS — I272 Other secondary pulmonary hypertension: Secondary | ICD-10-CM | POA: Diagnosis not present

## 2015-07-12 DIAGNOSIS — E1122 Type 2 diabetes mellitus with diabetic chronic kidney disease: Secondary | ICD-10-CM | POA: Diagnosis not present

## 2015-07-15 DIAGNOSIS — I272 Other secondary pulmonary hypertension: Secondary | ICD-10-CM | POA: Diagnosis not present

## 2015-07-15 DIAGNOSIS — K3184 Gastroparesis: Secondary | ICD-10-CM | POA: Diagnosis not present

## 2015-07-15 DIAGNOSIS — E1143 Type 2 diabetes mellitus with diabetic autonomic (poly)neuropathy: Secondary | ICD-10-CM | POA: Diagnosis not present

## 2015-07-15 DIAGNOSIS — E039 Hypothyroidism, unspecified: Secondary | ICD-10-CM | POA: Diagnosis not present

## 2015-07-15 DIAGNOSIS — E1140 Type 2 diabetes mellitus with diabetic neuropathy, unspecified: Secondary | ICD-10-CM | POA: Diagnosis not present

## 2015-07-15 DIAGNOSIS — I130 Hypertensive heart and chronic kidney disease with heart failure and stage 1 through stage 4 chronic kidney disease, or unspecified chronic kidney disease: Secondary | ICD-10-CM | POA: Diagnosis not present

## 2015-07-15 DIAGNOSIS — D509 Iron deficiency anemia, unspecified: Secondary | ICD-10-CM | POA: Diagnosis not present

## 2015-07-15 DIAGNOSIS — K219 Gastro-esophageal reflux disease without esophagitis: Secondary | ICD-10-CM | POA: Diagnosis not present

## 2015-07-15 DIAGNOSIS — K922 Gastrointestinal hemorrhage, unspecified: Secondary | ICD-10-CM | POA: Diagnosis not present

## 2015-07-15 DIAGNOSIS — I5032 Chronic diastolic (congestive) heart failure: Secondary | ICD-10-CM | POA: Diagnosis not present

## 2015-07-15 DIAGNOSIS — E1122 Type 2 diabetes mellitus with diabetic chronic kidney disease: Secondary | ICD-10-CM | POA: Diagnosis not present

## 2015-07-15 DIAGNOSIS — I480 Paroxysmal atrial fibrillation: Secondary | ICD-10-CM | POA: Diagnosis not present

## 2015-07-15 DIAGNOSIS — N183 Chronic kidney disease, stage 3 (moderate): Secondary | ICD-10-CM | POA: Diagnosis not present

## 2015-07-18 ENCOUNTER — Telehealth: Payer: Self-pay | Admitting: *Deleted

## 2015-07-18 DIAGNOSIS — E1140 Type 2 diabetes mellitus with diabetic neuropathy, unspecified: Secondary | ICD-10-CM | POA: Diagnosis not present

## 2015-07-18 DIAGNOSIS — E1122 Type 2 diabetes mellitus with diabetic chronic kidney disease: Secondary | ICD-10-CM | POA: Diagnosis not present

## 2015-07-18 DIAGNOSIS — K3184 Gastroparesis: Secondary | ICD-10-CM | POA: Diagnosis not present

## 2015-07-18 DIAGNOSIS — E039 Hypothyroidism, unspecified: Secondary | ICD-10-CM | POA: Diagnosis not present

## 2015-07-18 DIAGNOSIS — I272 Other secondary pulmonary hypertension: Secondary | ICD-10-CM | POA: Diagnosis not present

## 2015-07-18 DIAGNOSIS — N183 Chronic kidney disease, stage 3 (moderate): Secondary | ICD-10-CM | POA: Diagnosis not present

## 2015-07-18 DIAGNOSIS — I130 Hypertensive heart and chronic kidney disease with heart failure and stage 1 through stage 4 chronic kidney disease, or unspecified chronic kidney disease: Secondary | ICD-10-CM | POA: Diagnosis not present

## 2015-07-18 DIAGNOSIS — K219 Gastro-esophageal reflux disease without esophagitis: Secondary | ICD-10-CM | POA: Diagnosis not present

## 2015-07-18 DIAGNOSIS — D573 Sickle-cell trait: Secondary | ICD-10-CM | POA: Diagnosis not present

## 2015-07-18 DIAGNOSIS — I5032 Chronic diastolic (congestive) heart failure: Secondary | ICD-10-CM | POA: Diagnosis not present

## 2015-07-18 DIAGNOSIS — K922 Gastrointestinal hemorrhage, unspecified: Secondary | ICD-10-CM | POA: Diagnosis not present

## 2015-07-18 DIAGNOSIS — I480 Paroxysmal atrial fibrillation: Secondary | ICD-10-CM | POA: Diagnosis not present

## 2015-07-18 DIAGNOSIS — E1143 Type 2 diabetes mellitus with diabetic autonomic (poly)neuropathy: Secondary | ICD-10-CM | POA: Diagnosis not present

## 2015-07-18 DIAGNOSIS — D509 Iron deficiency anemia, unspecified: Secondary | ICD-10-CM | POA: Diagnosis not present

## 2015-07-18 DIAGNOSIS — E669 Obesity, unspecified: Secondary | ICD-10-CM | POA: Diagnosis not present

## 2015-07-18 NOTE — Telephone Encounter (Signed)
Pt reported to [facility] today that she had gone to urinate and saw a large amt of red blood, she denies this happening since Sunday, she denied clots in the commode. HHN states pt is sluggish, tired and just generally not herself, BP 88/62, HHN would like to do a cbc in home, do you approve? Pt's next clinic f/u 5/22

## 2015-07-19 DIAGNOSIS — K922 Gastrointestinal hemorrhage, unspecified: Secondary | ICD-10-CM | POA: Diagnosis not present

## 2015-07-19 DIAGNOSIS — E1143 Type 2 diabetes mellitus with diabetic autonomic (poly)neuropathy: Secondary | ICD-10-CM | POA: Diagnosis not present

## 2015-07-19 DIAGNOSIS — I130 Hypertensive heart and chronic kidney disease with heart failure and stage 1 through stage 4 chronic kidney disease, or unspecified chronic kidney disease: Secondary | ICD-10-CM | POA: Diagnosis not present

## 2015-07-19 DIAGNOSIS — I5032 Chronic diastolic (congestive) heart failure: Secondary | ICD-10-CM | POA: Diagnosis not present

## 2015-07-19 DIAGNOSIS — N183 Chronic kidney disease, stage 3 (moderate): Secondary | ICD-10-CM | POA: Diagnosis not present

## 2015-07-19 DIAGNOSIS — D509 Iron deficiency anemia, unspecified: Secondary | ICD-10-CM | POA: Diagnosis not present

## 2015-07-19 DIAGNOSIS — D573 Sickle-cell trait: Secondary | ICD-10-CM | POA: Diagnosis not present

## 2015-07-19 DIAGNOSIS — E1122 Type 2 diabetes mellitus with diabetic chronic kidney disease: Secondary | ICD-10-CM | POA: Diagnosis not present

## 2015-07-19 DIAGNOSIS — E1140 Type 2 diabetes mellitus with diabetic neuropathy, unspecified: Secondary | ICD-10-CM | POA: Diagnosis not present

## 2015-07-19 DIAGNOSIS — E669 Obesity, unspecified: Secondary | ICD-10-CM | POA: Diagnosis not present

## 2015-07-19 DIAGNOSIS — I480 Paroxysmal atrial fibrillation: Secondary | ICD-10-CM | POA: Diagnosis not present

## 2015-07-19 DIAGNOSIS — K3184 Gastroparesis: Secondary | ICD-10-CM | POA: Diagnosis not present

## 2015-07-19 DIAGNOSIS — E039 Hypothyroidism, unspecified: Secondary | ICD-10-CM | POA: Diagnosis not present

## 2015-07-19 DIAGNOSIS — I272 Other secondary pulmonary hypertension: Secondary | ICD-10-CM | POA: Diagnosis not present

## 2015-07-19 DIAGNOSIS — K219 Gastro-esophageal reflux disease without esophagitis: Secondary | ICD-10-CM | POA: Diagnosis not present

## 2015-07-21 ENCOUNTER — Telehealth: Payer: Self-pay | Admitting: Emergency Medicine

## 2015-07-21 NOTE — Telephone Encounter (Signed)
If this has not already been addressed, Yes, please do CBC and then call with results.  She needs to go to short stay for blood products if needed.

## 2015-07-21 NOTE — Telephone Encounter (Signed)
Patient returned call from nurse, [place] is [phone].

## 2015-07-21 NOTE — Telephone Encounter (Signed)
Spoke with pt. I explained my conversation that I had with Bonnita Nasuti at her PCP's office. The pt is confused as to why they called [place]. She states that her scleroderma has been flaring up and that's what's making her fatigued and just not feeling like herself. I asked her if she was having any pulmonary issues, such as SOB, chest tightness, wheezing or coughing and she stated that she is not. I asked her if she would like to make an appointment with RB to address her issues of fatigue and she declined at this time. I addressed her low HGB level and states, "This is an issue that my PCP or my Cardiologist should handle." Advised pt that if she for any reason feels like she need to move up her appointment with RB to call our office. She agreed and verbalized understanding. Nothing further was needed.

## 2015-07-21 NOTE — Telephone Encounter (Signed)
rec'd cbc w/ diff from [facility], called dr Daryll Drown w/ results, hgb 10.1, hct 31.8 Dr Daryll Drown states pt probably needs to see dr byrum for addressing PAH due to sluggish, tired,  general malaise [facility], the case manager ask for im[facility] to notify dr byrum's office Called dr byrum at [facility], spoke w/ lindsay, faxed lab and notes taken over ph from visit 5/5 and 5/6 to [phone], have faxed 3 times, fax says" # not answering", will call and verify after 3rd attempt

## 2015-07-21 NOTE — Telephone Encounter (Addendum)
Spoke with Bonnita Nasuti at [facility]. States that pt had rectal bleeding last week. Pt had a CBC drawn and her HBG was low at 10.1. Bonnita Nasuti states that the pt's home care nurse stated that the pt "just wasn't herself" when she visited her last week. Pt was seen by PCP last week and her vitals were normal. Bonnita Nasuti states that the pt was very lethargic and "just not herself." Pt's PCP thinks all of her symptoms are pulmonary related. When I asked Bonnita Nasuti if the pt complained of any pulmonary related problems, her response was no. Pt's PCP thinks that the pt needs to see [place] for being lethargic and "just not herself." Rectal bleeding is no longer an issue per the pt. I have left a message with the pt to check on her and see if she would like to schedule an appointment.

## 2015-07-21 NOTE — Telephone Encounter (Signed)
Called pt and reassured her that her hgb and hct are higher, i explained why on the information that imc was given that dr Daryll Drown felt that dr byrum should possibly see pt. Pt did state that she wants only to see dr Daryll Drown and i also explained that dr Daryll Drown is not always in the clinic that in a resident clinic that pts quite often will need to see one of the doctors in clinic when the need to be seen happens, i also informed her that i would speak to dr Daryll Drown and see if she could see her soon, she was agreeable

## 2015-07-22 NOTE — Telephone Encounter (Signed)
Dr Daryll Drown added pt to your 6/28 schedule at 1115, is this ok for you? i will call her after i hear from you, thanks, hla

## 2015-07-22 NOTE — Telephone Encounter (Signed)
Yes.  Thank you.

## 2015-07-22 NOTE — Telephone Encounter (Signed)
Spoke w/ dr Daryll Drown, she states it will be fine to overbook pt in June as long as there are no more than 7 pts on the schedule will review schedule for June and do so

## 2015-07-24 DIAGNOSIS — I272 Other secondary pulmonary hypertension: Secondary | ICD-10-CM | POA: Diagnosis not present

## 2015-07-24 DIAGNOSIS — M79606 Pain in leg, unspecified: Secondary | ICD-10-CM | POA: Diagnosis not present

## 2015-07-24 DIAGNOSIS — K219 Gastro-esophageal reflux disease without esophagitis: Secondary | ICD-10-CM | POA: Diagnosis not present

## 2015-07-24 DIAGNOSIS — M150 Primary generalized (osteo)arthritis: Secondary | ICD-10-CM | POA: Diagnosis not present

## 2015-07-24 DIAGNOSIS — M340 Progressive systemic sclerosis: Secondary | ICD-10-CM | POA: Diagnosis not present

## 2015-07-25 DIAGNOSIS — I272 Other secondary pulmonary hypertension: Secondary | ICD-10-CM | POA: Diagnosis not present

## 2015-07-25 DIAGNOSIS — D509 Iron deficiency anemia, unspecified: Secondary | ICD-10-CM | POA: Diagnosis not present

## 2015-07-25 DIAGNOSIS — E1143 Type 2 diabetes mellitus with diabetic autonomic (poly)neuropathy: Secondary | ICD-10-CM | POA: Diagnosis not present

## 2015-07-25 DIAGNOSIS — I480 Paroxysmal atrial fibrillation: Secondary | ICD-10-CM | POA: Diagnosis not present

## 2015-07-25 DIAGNOSIS — E669 Obesity, unspecified: Secondary | ICD-10-CM | POA: Diagnosis not present

## 2015-07-25 DIAGNOSIS — E039 Hypothyroidism, unspecified: Secondary | ICD-10-CM | POA: Diagnosis not present

## 2015-07-25 DIAGNOSIS — E1122 Type 2 diabetes mellitus with diabetic chronic kidney disease: Secondary | ICD-10-CM | POA: Diagnosis not present

## 2015-07-25 DIAGNOSIS — E1140 Type 2 diabetes mellitus with diabetic neuropathy, unspecified: Secondary | ICD-10-CM | POA: Diagnosis not present

## 2015-07-25 DIAGNOSIS — N183 Chronic kidney disease, stage 3 (moderate): Secondary | ICD-10-CM | POA: Diagnosis not present

## 2015-07-25 DIAGNOSIS — K922 Gastrointestinal hemorrhage, unspecified: Secondary | ICD-10-CM | POA: Diagnosis not present

## 2015-07-25 DIAGNOSIS — I130 Hypertensive heart and chronic kidney disease with heart failure and stage 1 through stage 4 chronic kidney disease, or unspecified chronic kidney disease: Secondary | ICD-10-CM | POA: Diagnosis not present

## 2015-07-25 DIAGNOSIS — I5032 Chronic diastolic (congestive) heart failure: Secondary | ICD-10-CM | POA: Diagnosis not present

## 2015-07-25 DIAGNOSIS — D573 Sickle-cell trait: Secondary | ICD-10-CM | POA: Diagnosis not present

## 2015-07-25 DIAGNOSIS — K3184 Gastroparesis: Secondary | ICD-10-CM | POA: Diagnosis not present

## 2015-07-25 DIAGNOSIS — K219 Gastro-esophageal reflux disease without esophagitis: Secondary | ICD-10-CM | POA: Diagnosis not present

## 2015-07-28 ENCOUNTER — Ambulatory Visit
Admission: RE | Admit: 2015-07-28 | Discharge: 2015-07-28 | Disposition: A | Discharge status: Home / Self Care | Payer: Medicare Other | Source: Ambulatory Visit

## 2015-07-28 DIAGNOSIS — Z1231 Encounter for screening mammogram for malignant neoplasm of breast: Secondary | ICD-10-CM

## 2015-07-29 ENCOUNTER — Ambulatory Visit (HOSPITAL_COMMUNITY)
Admission: RE | Admit: 2015-07-29 | Discharge: 2015-07-29 | Disposition: A | Discharge status: Home / Self Care | Payer: Medicare Other | Source: Ambulatory Visit | Attending: Cardiology | Admitting: Cardiology

## 2015-07-29 ENCOUNTER — Telehealth (HOSPITAL_COMMUNITY): Payer: Self-pay | Admitting: *Deleted

## 2015-07-29 ENCOUNTER — Encounter (HOSPITAL_COMMUNITY): Payer: Self-pay

## 2015-07-29 VITALS — BP 102/56 | HR 71 | Wt 149.8 lb

## 2015-07-29 DIAGNOSIS — I272 Other secondary pulmonary hypertension: Secondary | ICD-10-CM | POA: Insufficient documentation

## 2015-07-29 DIAGNOSIS — I5032 Chronic diastolic (congestive) heart failure: Secondary | ICD-10-CM | POA: Diagnosis not present

## 2015-07-29 DIAGNOSIS — N183 Chronic kidney disease, stage 3 unspecified: Secondary | ICD-10-CM

## 2015-07-29 DIAGNOSIS — I480 Paroxysmal atrial fibrillation: Secondary | ICD-10-CM

## 2015-07-29 LAB — BASIC METABOLIC PANEL
Anion gap: 13 (ref 5–15)
BUN: 62 mg/dL — ABNORMAL HIGH (ref 6–20)
CALCIUM: 9 mg/dL (ref 8.9–10.3)
CO2: 23 mmol/L (ref 22–32)
CREATININE: 1.95 mg/dL — AB (ref 0.44–1.00)
Chloride: 102 mmol/L (ref 101–111)
GFR, EST AFRICAN AMERICAN: 30 mL/min — AB (ref >60)
GFR, EST NON AFRICAN AMERICAN: 26 mL/min — AB (ref >60)
Glucose, Bld: 72 mg/dL (ref 65–99)
Potassium: 3.7 mmol/L (ref 3.5–5.1)
SODIUM: 138 mmol/L (ref 135–145)

## 2015-07-29 LAB — CBC
HCT: 29 % — ABNORMAL LOW (ref 36.0–46.0)
Hemoglobin: 9.4 g/dL — ABNORMAL LOW (ref 12.0–15.0)
MCH: 25.9 pg — ABNORMAL LOW (ref 26.0–34.0)
MCHC: 32.4 g/dL (ref 30.0–36.0)
MCV: 79.9 fL (ref 78.0–100.0)
PLATELETS: 180 10*3/uL (ref 150–400)
RBC: 3.63 MIL/uL — ABNORMAL LOW (ref 3.87–5.11)
RDW: 17.5 % — AB (ref 11.5–15.5)
WBC: 4.1 10*3/uL (ref 4.0–10.5)

## 2015-07-29 LAB — BRAIN NATRIURETIC PEPTIDE: B Natriuretic Peptide: 589.8 pg/mL — ABNORMAL HIGH (ref 0.0–100.0)

## 2015-07-29 MED ORDER — TORSEMIDE 20 MG PO TABS: 60.0000 mg | ORAL_TABLET | Freq: Two times a day (BID) | ORAL | Status: DC

## 2015-07-29 NOTE — Progress Notes (Signed)
6 Minute Walk Patient ambulated total of 400 ft (120 meters), very slow pace with painful gate causing the short duration of her walk. Patient had to take multiple rest breaks and walk holding the railing in the hallway to help ease her leg pain as she walked. Breathing remained unlabored, sats 90-95%, HR maintained 90-110 BPM. Dr. Aundra Dubin made aware of results.  Leory Plowman, Guinevere Ferrari

## 2015-07-29 NOTE — Patient Instructions (Signed)
Routine lab work today. Will notify you of abnormal results, otherwise no news is good news!  Continue taking Torsemide 60 mg (3 tabs) twice daily.  Follow up 6 weeks with Dr. Aundra Dubin.  Do the following things EVERYDAY: 1) Weigh yourself in the morning before breakfast. Write it down and keep it in a log. 2) Take your medicines as prescribed 3) Eat low salt foods-Limit salt (sodium) to 2000 mg per day.  4) Stay as active as you can everyday 5) Limit all fluids for the day to less than 2 liters

## 2015-07-29 NOTE — Telephone Encounter (Signed)
Hospice RN called to let us know that pt's wt has been gradually increased, gained about 5 lbs in 2 weeks.  She states on Mon 5/8 pt was running low on Torsemide so she decreased her dose to 40 mg BID, instead of her prescribed 60 BID.  She made this discovery on Fri when she was seeing pt and advised pt to increase back to 60 BID, pt will need new prescription so the pharmacy know we increased dose.  Pt's wt 5/1 was 139 and she was at 144.8 on Fri.  Pt has appt w/Dr Aundra Dubin today, will inform of lowered Tor dose and will send in new rx at appt.

## 2015-07-31 NOTE — Progress Notes (Signed)
Patient ID: [name], female   DOB: 1950-12-24, 65 y.o.   MRN: [id number]    Advanced Heart Failure Clinic Note   Pulmonary: Dr Lamonte Sakai  HF: Dr Aundra Dubin  HPI: [name] is a 65 yo female with a history of DM2, HLD, HTN, pHTN, GERD, gastroparesis, scleroderma, DJD and diastolic HF.   Of note she has been on several PAH targeted therapies and coumadin over last 9 years, but has not been reliably on therapy due to noncompliance and inability to have labs performed. She has had best response to Tyvaso (+ bosentan) with an improvement in 6 minute walk and in PASP from 102 mmHg (7/09) to 42 mmHg (9/10). After stopping Tyvaso her PASP rose to 80's by TTE 6/13 and 11/14. Surprisingly, she has never required supplemental O2. Macitentan was started in 11/14. She developed nausea and diarrhea and macitentan was stopped early 3/15. Most recently, she had been on Letairis and Adcirca (followed by Dr. Lamonte Sakai).   Admitted 10/14-10/18/15 for A/C RHF, volume overload and syncope. Diuresed with IV lasix and milrinone a total of 26 lbs. Discharge weight was 156 lbs and started on lasix 40 mg BID.   Admitted 12/29 with dyspnea and chest pain. BP was low, and Lasix and Adcirca were both initially held. Echo showed EF 60-65% with moderately dilated and dysfunctional RV, PASP 111 mmHg with moderate TR. There was a large pericardial effusion without definite tamponade. Presentation was suspected to be due to progressive PAH with secondary RV failure rather than tamponade. No pericardiocentesis yet. She was started on milrinone gtt and diuresed. Shorter-acting sildenafil was started instead of Adcirca. Also had GI bleed. 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 Discharge weight was 146 pounds.   She was admitted in 2/16 with acute on chronic diastolic CHF/RV failure and hypotension.  Echo (2/16) showed EF 60%, moderate-severe RV dilation with moderate-severe RV systolic  dysfunction, PA systolic pressure 62 mmHg, large pericardial effusion with borderline tamponade.  She had pericardiocentesis with improvement in symptoms.  No malignant cells in pericardial fluid.  Repeat echo showed no pericardial effusion.  She developed paroxysmal atrial fibrillation on 2/19 but went back into NSR on amiodarone.  She was not anticoagulated given history of GI bleeding and pericardial effusion.   She was again admitted in 10/16.  She had been out of meds x 1 week and gained 20 lbs.  She also was noted to be severely anemic with iron deficiency.  She was heme negative.  She got 2 units PRBCs.  She was diuresed extensively.  She was started on Levoxyl for hypothyroidism.   Admitted In December 2016  with GI bleed. Multiple AVMs noted with 10-15 ablations completed. PAH meds continued. Diuretics cut back --torsemide 40 mg daily. Discharge weight was 138  pounds.   She was admitted again in early 4/17 with recurrent GI bleed.  She had enteroscopy with clipping of small bowel AVM.  She was take off her diuretics at that time.  She restarted torsemide eventually on her own accord at home but has not been on metolazone.   She returns for followup today.  Weight stable.  She is short of breath after walking about 50-100 feet, thinks this is a bit better.  She denies lightheadedness or syncope.  No falls.  No further BRBPR or melena.  We did a 6 minute walk today => she was actually limited by leg pain, not dyspnea.   6 min  walk (05/16/14): 125 m 6 min walk (6/16): 146 m 6 min walk (8/16): 183 m 6 min walk (1/17): 170 m (2 breaks, may have short changed her a bit - stable from last) 6 min walk (5/17): 120 m (multiple rest breaks, not short of breath but limited by leg pain).   Echo (1/16) with EF 55-60%, severely dilated RA with moderately decreased systolic function, large pericardial effusion.  ECHO 05/09/2014: EF 60-65% Peak PA pressure 52  ECHO 06/05/14: EF 55-60%, RV moderately dilated  with moderately decreased systolic function with D-shaped interventricular septum. Severe RAE. RVSP 59. No pericardial effusion. Echo (8/16): EF 60-65%, moderate-severe RV dilation with mildly decreased RV systolic function, D-shaped interventricular septum, PASP 92, severe biatrial enlargement, no pericardial effusion.  Echo (10/16): EF 60-65%, D-shaped septum, RV moderately dilated, PASP 76 mmHg, severe TR.  ABIs (11/16): Normal  RHC (1/16):  Hemodynamics (mmHg) RA mean 12 RV 64/14 PA 68/29, mean 42 PCWP mean 12 LV 102/14 AO 105/53 PA 67% AO 93% Cardiac Output (Fick) 6.63  Cardiac Index (Fick) 3.88  PVR 4.5 WU  Labs 03/24/2014 K 3.8 Creatinine 1.51 Hgb 9.3  Labs 2/16 BNP 121, K 4.4, creatinine 1.27, hgb 9.2 Labs 2/16 K 3.8, creatinine 1.58, AST 45, ALT 12 Labs 05/22/2014: Creatinine 2.1 K 3.7  Labs 06/05/2014: Creatinine 1.5 K 3.7, LFTs normal Labs 6/16: K 4.4, creatinine 1.82, hgb 8.8 Labs 10/16: K 4.4, creatinine 1.77 => 1.64, hgb 7.9 => 8.4, LFTs normal, Mg 1.6 Labs 01/21/2015: K 3.8 Creatinine 1.67  Labs 02/2015: K 4.3 Creatinine 1.72 => 2.4 Hgb 8.5  Labs 4/17: hgb 8.6, creatinine 1.8 => 1.68, K 3.9, LFTs normal  ECG: NSR, low voltage, 1st degree AV block.  ROS: All systems negative except as listed in HPI, PMH and Problem List.  SH:  Social History   Social History  . Marital Status: Legally Separated    Spouse Name: N/A  . Number of Children: 2  . Years of Education: 11   Occupational History  . Umemployed   . Disability     Social History Main Topics  . Smoking status: Never Smoker   . Smokeless tobacco: Never Used  . Alcohol Use: No  . Drug Use: No  . Sexual Activity: No   Other Topics Concern  . Not on file   Social History Narrative    FAMILY HISTORY:  Significant for coronary artery disease and diabetes   Patient lives at home with granddaughter.    Patient has 2 children.    Patient has 11 years of education.    Patient is on disability.     Patient is right handed.    Patient is separated.      FH:  Family History  Problem Relation Age of Onset  . Heart disease Mother   . Diabetes Mother   . Diabetes Sister     Past Medical History  Diagnosis Date  . Secondary pulmonary hypertension ([place])     a. 2/2 scleroderma  . GERD (gastroesophageal reflux disease)   . Systemic sclerosis ([place])   . Unspecified essential hypertension   . Gastroparesis   . Sickle cell trait ([place])   . Obesity   . Scleroderma ([place])   . Trichomonas   . Peripheral neuropathy ([place])   . Type II diabetes mellitus ([facility])     a. diet control   . Arthritis   . Chronic kidney disease   . Gout   . Hypothyroidism  a. may be due to amiodarone use. started on synthroid on 12/2014 admission   . Anemia     a.  hx of GI bleed and duodenal AVMs  . Chronic diastolic CHF (congestive heart failure) ([place])     a. Echo 12/23/14 withEF 60-65%, moderately dilated RV, PASP 72, no pericardial effusion.  Marland Kitchen PAF (paroxysmal atrial fibrillation) ([facility])     a. not a long term AC candidate due to GI bleeds  . Angiodysplasia of colon with hemorrhage     Current Outpatient Prescriptions  Medication Sig Dispense Refill  . ambrisentan (LETAIRIS) 10 MG tablet Take 1 tablet (10 mg total) by mouth daily. 30 tablet 11  . amiodarone (PACERONE) 200 MG tablet Take 1 tablet (200 mg total) by mouth daily. 30 tablet 6  . esomeprazole (NEXIUM) 40 MG capsule Take 40 mg by mouth daily at 12 noon.     . fenofibrate 54 MG tablet Take 1 tablet (54 mg total) by mouth daily. 30 tablet 12  . FLUoxetine (PROZAC) 20 MG capsule Take 1 capsule (20 mg total) by mouth every morning. 30 capsule 5  . HYDROcodone-acetaminophen (NORCO) 10-325 MG tablet Take 1 tablet by mouth every 6 (six) hours as needed. pain  0  . hydrOXYzine (ATARAX/VISTARIL) 10 MG tablet Take 1 tablet (10 mg total) by mouth 2 (two) times daily as needed for itching. 20 tablet 0  . levothyroxine (SYNTHROID, LEVOTHROID) 25 MCG  tablet Take 1 tablet (25 mcg total) by mouth daily before breakfast. 30 tablet 11  . metolazone (ZAROXOLYN) 2.5 MG tablet Take 1 tablet (2.5 mg total) by mouth 2 (two) times a week. Every Monday and Friday 90 tablet 3  . nortriptyline (PAMELOR) 10 MG capsule Take 2 capsules (20 mg total) by mouth at bedtime. 60 capsule 8  . potassium chloride SA (K-DUR,KLOR-CON) 20 MEQ tablet Take 40 mEq by mouth daily.    . pregabalin (LYRICA) 150 MG capsule Take 1 capsule (150 mg total) by mouth 3 (three) times daily. 90 capsule 5  . Riociguat (ADEMPAS) 1 MG TABS Take 1 mg by mouth 3 (three) times daily.    . Selexipag (UPTRAVI) 1600 MCG TABS Take 1,600 mcg by mouth 2 (two) times daily. 60 tablet 3  . torsemide (DEMADEX) 20 MG tablet Take 3 tablets (60 mg total) by mouth 2 (two) times daily. 180 tablet 6   No current facility-administered medications for this encounter.    Filed Vitals:   07/29/15 1213  BP: 102/56  Pulse: 71  Weight: 149 lb 12 oz (67.926 kg)  SpO2: 96%   Wt Readings from Last 3 Encounters:  07/29/15 149 lb 12 oz (67.926 kg)  07/09/15 149 lb (67.586 kg)  07/03/15 157 lb 8 oz (71.442 kg)     PHYSICAL EXAM: General: Elderly and fatigued appearing. Sister present.  Neck: JVP 8 cm. No thyromegaly or thyroid nodule. Lungs: diminished, bilateral crackles in bases. CV: RV heave. Heart regular S1/S2 with widely split S2,  3/6 HSM LLSB. Trace edema.   Unable to palpate pedal pulses.  Abdomen: soft, NT, moderately distended, no HSM. No bruits or masses. +BS  Neurologic: Alert and oriented x 3.  Psych: Affect flat. Extremities: No clubbing or cyanosis.   ASSESSMENT & PLAN: 1. 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  Echo 10/16  EF 60-65% with moderate dilated and mildly dysfunctional RV, severe TR. Pericardial effusion had resolved. NYHA class III symptoms, stable.  Volume looks better on  exam than at last appointment.  She did not do well on 6 minute walk  but was limited by leg pain more than dyspnea. - Continue torsemide 60 mg bid.  - Continue taking metolazone 2.5 mg every Monday and Friday.  - KCl 40 daily, take an extra KCl 20 on metolazone days.  - BMET/BNP today.  2. PAH with cor pulmonale: Severe PAH.  Had last [place] in 1/16. Pulmonary pressure remains elevated by echo in 10/16. - Continue ambrisentan 10 mg daily.  - Continue selexipag 1600 mcg bid.  - She is now on riociguat 1 mg tid.   - As above, 6 minute walk worse but limited by bilateral leg pain.  Unsure of cause of leg pain, normal ABIs in 11/16.  3. CKD stage III: BMET today.  4. 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  She is no longer anticoagulated. CBC today. 5. Pericardial effusion: Resolved after pericardiocentesis, not seen on echo 12/2014.   6. Atrial fibrillation: Paroxysmal. NSR today.  - Continue amiodarone.  Recent LFTs normal.  She will get a yearly eye exam while on amiodarone.  - No anticoagulation with h/o GI bleeding.  7.  Hypothyroidism: May be related to amiodarone.   - On Levoxyl 25 mcg daily.    She will need followup in 6 wks.   [name] 07/31/2015

## 2015-08-01 ENCOUNTER — Other Ambulatory Visit (HOSPITAL_COMMUNITY): Payer: Self-pay | Admitting: Cardiology

## 2015-08-01 DIAGNOSIS — I509 Heart failure, unspecified: Secondary | ICD-10-CM

## 2015-08-04 ENCOUNTER — Ambulatory Visit (INDEPENDENT_AMBULATORY_CARE_PROVIDER_SITE_OTHER): Payer: Medicare Other | Admitting: Internal Medicine

## 2015-08-04 ENCOUNTER — Encounter: Payer: Self-pay | Admitting: Internal Medicine

## 2015-08-04 VITALS — BP 96/60 | HR 61 | Temp 97.9°F | Ht 64.0 in | Wt 152.6 lb

## 2015-08-04 DIAGNOSIS — I5032 Chronic diastolic (congestive) heart failure: Secondary | ICD-10-CM | POA: Diagnosis not present

## 2015-08-04 DIAGNOSIS — K5521 Angiodysplasia of colon with hemorrhage: Secondary | ICD-10-CM

## 2015-08-04 DIAGNOSIS — E1142 Type 2 diabetes mellitus with diabetic polyneuropathy: Secondary | ICD-10-CM

## 2015-08-04 DIAGNOSIS — Q2733 Arteriovenous malformation of digestive system vessel: Secondary | ICD-10-CM | POA: Diagnosis not present

## 2015-08-04 LAB — POCT GLYCOSYLATED HEMOGLOBIN (HGB A1C): Hemoglobin A1C: 5.4

## 2015-08-04 LAB — GLUCOSE, CAPILLARY: Glucose-Capillary: 81 mg/dL (ref 65–99)

## 2015-08-04 NOTE — Progress Notes (Signed)
   Subjective:    Patient ID: [name], female    DOB: 11-27-1950, 65 y.o.   MRN: [id number]  HPI  [name] is a 65 year old woman with a PMH as below who comes to the clinic to discuss her Type 2 diabetes, previous hospitalizations for GI bleed 2/2 angiodysplasia and her chronic CHF 2/2 PAH 2/2 scleroderma [name] diabetes has been controlled with diet only. She would like to have her A1c checked today.  She denies any episodes of bloody stool or black, tarry stool. She feels tired and weak, but this is a longstanding problem.  She has been adherent with her torsemide and metolazone, she is tolerating these well. She denies any significant weight gain or leg swelling. She continues to have dyspnea on exertion, a longstanding problem. She says she has nurses that come in regularly who she describes as "Hospice nurses."     Past Medical History  Diagnosis Date  . Secondary pulmonary hypertension ([employer] [facility] [place])     a. 2/2 scleroderma  . GERD (gastroesophageal reflux disease)   . Systemic sclerosis ([place])   . Unspecified essential hypertension   . Sickle cell trait ([place])   . Scleroderma ([place])   . Peripheral neuropathy ([place])   . Type II diabetes mellitus ([facility])     a. diet control   . Arthritis   . Chronic kidney disease   . Gout   . Hypothyroidism     a. may be due to amiodarone use. started on synthroid on 12/2014 admission   . Anemia     a. hx of GI bleed and duodenal AVMs  . Chronic diastolic CHF (congestive heart failure) ([place])     a. Echo 12/23/14 withEF 60-65%, moderately dilated RV, PASP 72, no pericardial effusion.  Marland Kitchen PAF (paroxysmal atrial fibrillation) ([facility])     a. not a long term AC candidate due to GI bleeds  . Angiodysplasia of colon with hemorrhage            Review of Systems  Constitutional: Positive for fatigue. Negative for fever.  Respiratory: Positive for shortness of breath. Negative  for cough and wheezing.   Cardiovascular: Negative for chest pain, palpitations and leg swelling.  Gastrointestinal: Negative for abdominal pain, blood in stool and anal bleeding.  Neurological: Negative for dizziness, syncope, light-headedness and headaches.       Objective:   Physical Exam  HENT:  Mouth/Throat: Oropharynx is clear and moist. No oropharyngeal exudate.  Neck: Neck supple.  Cardiovascular:  RRR. Holosystolic murmur (3/6) heard best at left lower sternal border. 1+ pedal pulses  Pulmonary/Chest: Effort normal and breath sounds normal. No respiratory distress. She has no wheezes. She has no rales.  Abdominal: Soft. Bowel sounds are normal. There is no tenderness.  Musculoskeletal: She exhibits no tenderness.  Trace pedal edema.  Neurological: She is alert.  Tongue midline. Face symmetric.          Assessment & Plan:   Please see problem based assessment and plan for details.

## 2015-08-04 NOTE — Patient Instructions (Signed)
[name],  So great to see you again.  You're doing a great job.  Please make sure you follow up with your heart doctor and lung doctor.  We'll see you again next month.

## 2015-08-04 NOTE — Assessment & Plan Note (Signed)
A: Patient denies any recent bleeding episodes or dark stool. She may have another episode in the future, but it is stable for now. Hgb on 5/16 was 9.4  P: Continue to monitor. No clear indication for CBC today.

## 2015-08-04 NOTE — Assessment & Plan Note (Signed)
A: Regularly follows with heart failure clinic. No worsening shortness of breath. Has chronic dyspnea on exertion 2/2 pulmonary HTN. Pt. Is 152 lb today. Only trace leg edema.  P: f/u with heart failure.

## 2015-08-04 NOTE — Assessment & Plan Note (Signed)
A: A1c at 5.4 today. Elavil and lyrica are effective for neuropathy. Pt is not currently on any medication, diet-controlled only.  P:  A1c twice yearly Continue neuropathy meds

## 2015-08-07 NOTE — Progress Notes (Signed)
Internal Medicine Clinic Attending  Case discussed with Dr. Marijean Bravo at the time of the visit.  We reviewed the resident's history and exam and pertinent patient test results.  I agree with the assessment, diagnosis, and plan of care documented in the resident's note.

## 2015-08-12 ENCOUNTER — Telehealth: Payer: Self-pay | Admitting: Emergency Medicine

## 2015-08-12 NOTE — Telephone Encounter (Signed)
lmtcb X1 for pt. Pt needs to call billing dept at [phone] option 2 for this info.

## 2015-08-13 NOTE — Telephone Encounter (Signed)
Spoke with pt. She is aware of the below information. Nothing further was needed.

## 2015-08-15 ENCOUNTER — Other Ambulatory Visit (HOSPITAL_COMMUNITY): Payer: Medicare Other

## 2015-08-18 ENCOUNTER — Telehealth (HOSPITAL_COMMUNITY): Payer: Self-pay | Admitting: Vascular Surgery

## 2015-08-18 NOTE — Telephone Encounter (Signed)
Pt need refill Amiodarone

## 2015-08-20 ENCOUNTER — Other Ambulatory Visit (HOSPITAL_COMMUNITY): Payer: Self-pay

## 2015-08-20 MED ORDER — AMIODARONE HCL 200 MG PO TABS: 200.0000 mg | ORAL_TABLET | Freq: Every day | ORAL | Status: DC

## 2015-08-20 NOTE — Telephone Encounter (Signed)
Amiodarone refilled to preferred pharmacy electronically.

## 2015-08-21 ENCOUNTER — Telehealth: Payer: Self-pay

## 2015-08-21 NOTE — Telephone Encounter (Signed)
Called pt regarding appointment on 08/25/15.  Pt to see Dr. Lindi Adie and have possible feraheme infusion.  Per Dr. Lindi Adie, he wishes to have pt come in tomorrow for labs so he will know on Monday whether feraheme infusion is necessary.  I called pt to ask if she could come in tomorrow for lab work and she stated she was unsure.  She is attending a graduation but doesn't know the time of that.  Pt states she will call us in the morning to let us know if she can come in.  Contact information given to pt.  Pt without further questions at time of call.

## 2015-08-22 ENCOUNTER — Other Ambulatory Visit (HOSPITAL_BASED_OUTPATIENT_CLINIC_OR_DEPARTMENT_OTHER): Payer: Medicare Other

## 2015-08-22 ENCOUNTER — Encounter (HOSPITAL_COMMUNITY): Payer: Self-pay | Admitting: *Deleted

## 2015-08-22 ENCOUNTER — Other Ambulatory Visit: Payer: Self-pay

## 2015-08-22 DIAGNOSIS — D509 Iron deficiency anemia, unspecified: Secondary | ICD-10-CM

## 2015-08-22 DIAGNOSIS — D500 Iron deficiency anemia secondary to blood loss (chronic): Secondary | ICD-10-CM | POA: Diagnosis not present

## 2015-08-22 LAB — CBC WITH DIFFERENTIAL/PLATELET
BASO%: 0.4 % (ref 0.0–2.0)
Basophils Absolute: 0 10*3/uL (ref 0.0–0.1)
EOS%: 1.8 % (ref 0.0–7.0)
Eosinophils Absolute: 0.1 10*3/uL (ref 0.0–0.5)
HEMATOCRIT: 27.7 % — AB (ref 34.8–46.6)
HGB: 8.8 g/dL — ABNORMAL LOW (ref 11.6–15.9)
LYMPH#: 0.8 10*3/uL — AB (ref 0.9–3.3)
LYMPH%: 19.3 % (ref 14.0–49.7)
MCH: 24.8 pg — ABNORMAL LOW (ref 25.1–34.0)
MCHC: 31.8 g/dL (ref 31.5–36.0)
MCV: 78.1 fL — ABNORMAL LOW (ref 79.5–101.0)
MONO#: 0.3 10*3/uL (ref 0.1–0.9)
MONO%: 8.3 % (ref 0.0–14.0)
NEUT%: 70.2 % (ref 38.4–76.8)
NEUTROS ABS: 2.7 10*3/uL (ref 1.5–6.5)
PLATELETS: 149 10*3/uL (ref 145–400)
RBC: 3.54 10*6/uL — AB (ref 3.70–5.45)
RDW: 18.2 % — ABNORMAL HIGH (ref 11.2–14.5)
WBC: 3.9 10*3/uL (ref 3.9–10.3)

## 2015-08-22 LAB — IRON AND TIBC
%SAT: 7 % — ABNORMAL LOW (ref 21–57)
IRON: 21 ug/dL — AB (ref 41–142)
TIBC: 293 ug/dL (ref 236–444)
UIBC: 272 ug/dL (ref 120–384)

## 2015-08-22 LAB — FERRITIN: Ferritin: 184 ng/ml (ref 9–269)

## 2015-08-22 NOTE — Progress Notes (Addendum)
08-22-15 1415 Dr. Ermalene Postin reviewed meds-desires pt to take all Pulmonary hypertension meds AM of 09-02-15.

## 2015-08-25 ENCOUNTER — Ambulatory Visit (HOSPITAL_BASED_OUTPATIENT_CLINIC_OR_DEPARTMENT_OTHER): Payer: Medicare Other | Admitting: Hematology and Oncology

## 2015-08-25 ENCOUNTER — Other Ambulatory Visit: Payer: Medicare Other

## 2015-08-25 ENCOUNTER — Encounter: Payer: Self-pay | Admitting: Hematology and Oncology

## 2015-08-25 ENCOUNTER — Telehealth: Payer: Self-pay | Admitting: *Deleted

## 2015-08-25 ENCOUNTER — Ambulatory Visit (HOSPITAL_BASED_OUTPATIENT_CLINIC_OR_DEPARTMENT_OTHER): Payer: Medicare Other

## 2015-08-25 ENCOUNTER — Telehealth: Payer: Self-pay | Admitting: Hematology and Oncology

## 2015-08-25 VITALS — BP 102/45 | HR 66

## 2015-08-25 VITALS — BP 99/42 | HR 72 | Temp 99.3°F | Resp 16

## 2015-08-25 DIAGNOSIS — D509 Iron deficiency anemia, unspecified: Secondary | ICD-10-CM | POA: Diagnosis not present

## 2015-08-25 DIAGNOSIS — N183 Chronic kidney disease, stage 3 (moderate): Secondary | ICD-10-CM | POA: Diagnosis not present

## 2015-08-25 DIAGNOSIS — K5521 Angiodysplasia of colon with hemorrhage: Secondary | ICD-10-CM

## 2015-08-25 MED ORDER — SODIUM CHLORIDE 0.9 % IV SOLN
510.0000 mg | Freq: Once | INTRAVENOUS | Status: AC
  Administered 2015-08-25: 510 mg via INTRAVENOUS
  Filled 2015-08-25: qty 17

## 2015-08-25 MED ORDER — SODIUM CHLORIDE 0.9 % IV SOLN
Freq: Once | INTRAVENOUS | Status: AC
  Administered 2015-08-25: 15:00:00 via INTRAVENOUS

## 2015-08-25 NOTE — Patient Instructions (Signed)
Ferumoxytol injection What is this medicine? FERUMOXYTOL is an iron complex. Iron is used to make healthy red blood cells, which carry oxygen and nutrients throughout the body. This medicine is used to treat iron deficiency anemia in people with chronic kidney disease. This medicine may be used for other purposes; ask your health care provider or pharmacist if you have questions. What should I tell my health care provider before I take this medicine? They need to know if you have any of these conditions: -anemia not caused by low iron levels -high levels of iron in the blood -magnetic resonance imaging (MRI) test scheduled -an unusual or allergic reaction to iron, other medicines, foods, dyes, or preservatives -pregnant or trying to get pregnant -breast-feeding How should I use this medicine? This medicine is for injection into a vein. It is given by a health care professional in a hospital or clinic setting. Talk to your pediatrician regarding the use of this medicine in children. Special care may be needed. Overdosage: If you think you have taken too much of this medicine contact a poison control center or emergency room at once. NOTE: This medicine is only for you. Do not share this medicine with others. What if I miss a dose? It is important not to miss your dose. Call your doctor or health care professional if you are unable to keep an appointment. What may interact with this medicine? This medicine may interact with the following medications: -other iron products This list may not describe all possible interactions. Give your health care provider a list of all the medicines, herbs, non-prescription drugs, or dietary supplements you use. Also tell them if you smoke, drink alcohol, or use illegal drugs. Some items may interact with your medicine. What should I watch for while using this medicine? Visit your doctor or healthcare professional regularly. Tell your doctor or healthcare  professional if your symptoms do not start to get better or if they get worse. You may need blood work done while you are taking this medicine. You may need to follow a special diet. Talk to your doctor. Foods that contain iron include: whole grains/cereals, dried fruits, beans, or peas, leafy green vegetables, and organ meats (liver, kidney). What side effects may I notice from receiving this medicine? Side effects that you should report to your doctor or health care professional as soon as possible: -allergic reactions like skin rash, itching or hives, swelling of the face, lips, or tongue -breathing problems -changes in blood pressure -feeling faint or lightheaded, falls -fever or chills -flushing, sweating, or hot feelings -swelling of the ankles or feet Side effects that usually do not require medical attention (Report these to your doctor or health care professional if they continue or are bothersome.): -diarrhea -headache -nausea, vomiting -stomach pain This list may not describe all possible side effects. Call your doctor for medical advice about side effects. You may report side effects to FDA at 1-800-FDA-1088. Where should I keep my medicine? This drug is given in a hospital or clinic and will not be stored at home. NOTE: This sheet is a summary. It may not cover all possible information. If you have questions about this medicine, talk to your doctor, pharmacist, or health care provider.    2016, [employer] Standard. (2011-10-15 15:23:36)

## 2015-08-25 NOTE — Assessment & Plan Note (Addendum)
Acute on chronic severe iron deficiency anemia ( unresponsive to oral iron therapy) : Hospitalization 02/20/2015 to 02/26/2015 (Hb 5.5 angiodysplasia of colon with hemorrhage , CHF due to PAH, IDA, DM2, CKD-3) 5 Units of PRBC given. Colonoscopy 02/24/2015: numerous AVMs Undergone ablation Hospitalized 06/19/2015 to 06/27/2015: Active GI bleeding  Prior IV iron treatments: 05/04/2014 2, 12/24/2014 2, 01/29/2015 2, 02/25/2015 1, 05/02/15 x 2, 06/30/2015 X 1  Plan: 1. CBC checked today and I recommended to transfuse IV iron feraheme 510 mg. She has transportation issues 2. Follow-up with GI and cardiology  Return to clinic in 2 months for follow-up with labs and will block time for IV iron infusion.

## 2015-08-25 NOTE — Progress Notes (Signed)
Patient Care Team: Sid Falcon, MD as PCP - General (Internal Medicine) Michel Bickers, MD as Consulting Physician (Infectious Diseases) Dyke Maes, OD (Optometry)  DIAGNOSIS: Chronic recurrent iron deficiency anemia.  SUMMARY OF ONCOLOGIC HISTORY:Prior IV iron treatments: 05/04/2014 2, 12/24/2014 2, 01/29/2015 2, 02/25/2015 1, 05/02/15 x 2, 06/30/2015 X 1  CHIEF COMPLIANT: Follow-up on iron deficiency anemia due to blood loss  INTERVAL HISTORY: [name] is a 65 year old with above-mentioned history of chronic recurrent bilateral effusions anemia who is here for two-month follow-up. She says that since yesterday she has not been feeling so well. She denies any nausea or vomiting although she does have dominant seems to be disturbed. She appears to be frail and has required a wheelchair for ambulation today.  REVIEW OF SYSTEMS:  , Severe fatigue Constitutional: Denies fevers, chills or abnormal weight loss Eyes: Denies blurriness of vision Ears, nose, mouth, throat, and face: Denies mucositis or sore throat Respiratory: Denies cough, dyspnea or wheezes Cardiovascular: Denies palpitation, chest discomfort Gastrointestinal:  Denies nausea, heartburn or change in bowel habits Skin: Denies abnormal skin rashes Lymphatics: Denies new lymphadenopathy or easy bruising Neurological:Denies numbness, tingling or new weaknesses Behavioral/Psych: Mood is stable, no new changes  Extremities: No lower extremity edema  All other systems were reviewed with the patient and are negative.  I have reviewed the past medical history, past surgical history, social history and family history with the patient and they are unchanged from previous note.  ALLERGIES:  is allergic to cephalexin; ciprofloxacin; codeine; contrast media; and iohexol.  MEDICATIONS:  Current Outpatient Prescriptions  Medication Sig Dispense Refill  . ambrisentan (LETAIRIS) 10 MG tablet Take 1 tablet (10 mg total) by  mouth daily. 30 tablet 11  . amiodarone (PACERONE) 200 MG tablet Take 1 tablet (200 mg total) by mouth daily. 90 tablet 6  . esomeprazole (NEXIUM) 40 MG capsule Take 40 mg by mouth daily at 12 noon.     . fenofibrate 54 MG tablet Take 1 tablet (54 mg total) by mouth daily. 30 tablet 12  . FLUoxetine (PROZAC) 20 MG capsule Take 1 capsule (20 mg total) by mouth every morning. 30 capsule 5  . HYDROcodone-acetaminophen (NORCO) 10-325 MG tablet Take 1 tablet by mouth every 6 (six) hours as needed. pain  0  . hydrOXYzine (ATARAX/VISTARIL) 10 MG tablet Take 1 tablet (10 mg total) by mouth 2 (two) times daily as needed for itching. 20 tablet 0  . levothyroxine (SYNTHROID, LEVOTHROID) 25 MCG tablet Take 1 tablet (25 mcg total) by mouth daily before breakfast. 30 tablet 11  . metolazone (ZAROXOLYN) 2.5 MG tablet Take 1 tablet (2.5 mg total) by mouth 2 (two) times a week. Every Monday and Friday 90 tablet 3  . nortriptyline (PAMELOR) 10 MG capsule Take 2 capsules (20 mg total) by mouth at bedtime. 60 capsule 8  . potassium chloride SA (K-DUR,KLOR-CON) 20 MEQ tablet Take 40 mEq by mouth daily.    . pregabalin (LYRICA) 150 MG capsule Take 1 capsule (150 mg total) by mouth 3 (three) times daily. 90 capsule 5  . Riociguat (ADEMPAS) 1 MG TABS Take 1 mg by mouth 3 (three) times daily.    . Selexipag (UPTRAVI) 1600 MCG TABS Take 1,600 mcg by mouth 2 (two) times daily. 60 tablet 3  . torsemide (DEMADEX) 20 MG tablet Take 3 tablets (60 mg total) by mouth 2 (two) times daily. 180 tablet 6   No current facility-administered medications for this visit.    PHYSICAL  EXAMINATION: ECOG PERFORMANCE STATUS: 3 - Symptomatic, >50% confined to bed  Filed Vitals:   08/25/15 1344  BP: 99/42  Pulse: 72  Temp: 99.3 F (37.4 C)  Resp: 16   Filed Weights    GENERAL:alert, no distress and comfortable SKIN: skin color, texture, turgor are normal, no rashes or significant lesions EYES: normal, Conjunctiva are pink and  non-injected, sclera clear OROPHARYNX:no exudate, no erythema and lips, buccal mucosa, and tongue normal  NECK: supple, thyroid normal size, non-tender, without nodularity LYMPH:  no palpable lymphadenopathy in the cervical, axillary or inguinal LUNGS: clear to auscultation and percussion with normal breathing effort HEART: Pansystolic murmur in the mitral area suggestive of mitral regurgitation ABDOMEN:abdomen soft, non-tender and normal bowel sounds MUSCULOSKELETAL:no cyanosis of digits and no clubbing  NEURO: alert & oriented x 3 with fluent speech, no focal motor/sensory deficits EXTREMITIES: No lower extremity edema  LABORATORY DATA:  I have reviewed the data as listed   Chemistry      Component Value Date/Time   NA 138 07/29/2015 1303   NA 138 06/30/2015 1449   K 3.7 07/29/2015 1303   K 3.8 06/30/2015 1449   CL 102 07/29/2015 1303   CO2 23 07/29/2015 1303   CO2 26 06/30/2015 1449   BUN 62* 07/29/2015 1303   BUN 42.2* 06/30/2015 1449   CREATININE 1.95* 07/29/2015 1303   CREATININE 1.8* 06/30/2015 1449   CREATININE 1.82* 09/04/2014 1145      Component Value Date/Time   CALCIUM 9.0 07/29/2015 1303   CALCIUM 8.1* 06/30/2015 1449   ALKPHOS 84 06/19/2015 1554   AST 14* 06/19/2015 1554   ALT 7* 06/19/2015 1554   BILITOT 0.4 06/19/2015 1554   BILITOT 0.3 04/23/2015 1049       Lab Results  Component Value Date   WBC 3.9 08/22/2015   HGB 8.8* 08/22/2015   HCT 27.7* 08/22/2015   MCV 78.1* 08/22/2015   PLT 149 08/22/2015   NEUTROABS 2.7 08/22/2015     ASSESSMENT & PLAN:  Iron deficiency anemia Acute on chronic severe iron deficiency anemia ( unresponsive to oral iron therapy) : Hospitalization 02/20/2015 to 02/26/2015 (Hb 5.5 angiodysplasia of colon with hemorrhage , CHF due to PAH, IDA, DM2, CKD-3) 5 Units of PRBC given. Colonoscopy 02/24/2015: numerous AVMs Undergone ablation Hospitalized 06/19/2015 to 06/27/2015: Active GI bleeding  Prior IV iron treatments:  05/04/2014 2, 12/24/2014 2, 01/29/2015 2, 02/25/2015 1, 05/02/15 x 2, 06/30/2015 X 1  Plan: 1. CBC checked today and I recommended to transfuse IV iron feraheme 510 mg. She has transportation issues. 2. Follow-up with GI and cardiology  Return to clinic in 79month for follow-up with labs and will block time for IV iron infusion.   No orders of the defined types were placed in this encounter.   The patient has a good understanding of the overall plan. she agrees with it. she will call with any problems that may develop before the next visit here.   GRulon Eisenmenger MD 08/25/2015

## 2015-08-25 NOTE — Telephone Encounter (Signed)
per pof to [facility] pt appt-MW sch fera-gave pt copy of avs

## 2015-08-25 NOTE — Telephone Encounter (Signed)
Per staff message and POF I have scheduled appts. Advised scheduler of appts. JMW

## 2015-08-25 NOTE — Progress Notes (Signed)
Patient refused to stay for 30 minute post observation.

## 2015-08-26 ENCOUNTER — Encounter (HOSPITAL_COMMUNITY): Payer: Self-pay | Admitting: General Practice

## 2015-08-26 ENCOUNTER — Inpatient Hospital Stay (HOSPITAL_COMMUNITY)
Admission: AD | Admit: 2015-08-26 | Discharge: 2015-08-30 | DRG: 372 | Disposition: A | Discharge status: Home Health | Payer: Medicare Other | Source: Ambulatory Visit | Attending: Oncology | Admitting: Oncology

## 2015-08-26 ENCOUNTER — Inpatient Hospital Stay (HOSPITAL_COMMUNITY): Admission: RE | Admit: 2015-08-26 | Payer: Medicare Other | Source: Ambulatory Visit

## 2015-08-26 ENCOUNTER — Ambulatory Visit (HOSPITAL_COMMUNITY)
Admission: RE | Admit: 2015-08-26 | Discharge: 2015-08-26 | Disposition: A | Discharge status: Home / Self Care | Payer: Medicare Other | Source: Ambulatory Visit | Attending: Internal Medicine | Admitting: Internal Medicine

## 2015-08-26 ENCOUNTER — Telehealth: Payer: Self-pay | Admitting: *Deleted

## 2015-08-26 ENCOUNTER — Encounter: Payer: Self-pay | Admitting: Internal Medicine

## 2015-08-26 ENCOUNTER — Ambulatory Visit (INDEPENDENT_AMBULATORY_CARE_PROVIDER_SITE_OTHER): Payer: Medicare Other | Admitting: Internal Medicine

## 2015-08-26 VITALS — BP 92/40 | HR 73 | Temp 99.1°F | Ht 65.0 in | Wt 146.0 lb

## 2015-08-26 DIAGNOSIS — M349 Systemic sclerosis, unspecified: Secondary | ICD-10-CM | POA: Diagnosis not present

## 2015-08-26 DIAGNOSIS — Z885 Allergy status to narcotic agent status: Secondary | ICD-10-CM | POA: Diagnosis not present

## 2015-08-26 DIAGNOSIS — K219 Gastro-esophageal reflux disease without esophagitis: Secondary | ICD-10-CM | POA: Diagnosis not present

## 2015-08-26 DIAGNOSIS — E1122 Type 2 diabetes mellitus with diabetic chronic kidney disease: Secondary | ICD-10-CM

## 2015-08-26 DIAGNOSIS — I480 Paroxysmal atrial fibrillation: Secondary | ICD-10-CM | POA: Diagnosis present

## 2015-08-26 DIAGNOSIS — Z66 Do not resuscitate: Secondary | ICD-10-CM

## 2015-08-26 DIAGNOSIS — K31819 Angiodysplasia of stomach and duodenum without bleeding: Secondary | ICD-10-CM

## 2015-08-26 DIAGNOSIS — R1084 Generalized abdominal pain: Secondary | ICD-10-CM

## 2015-08-26 DIAGNOSIS — E440 Moderate protein-calorie malnutrition: Secondary | ICD-10-CM

## 2015-08-26 DIAGNOSIS — E876 Hypokalemia: Secondary | ICD-10-CM | POA: Diagnosis present

## 2015-08-26 DIAGNOSIS — Z833 Family history of diabetes mellitus: Secondary | ICD-10-CM

## 2015-08-26 DIAGNOSIS — E039 Hypothyroidism, unspecified: Secondary | ICD-10-CM

## 2015-08-26 DIAGNOSIS — R509 Fever, unspecified: Secondary | ICD-10-CM | POA: Diagnosis not present

## 2015-08-26 DIAGNOSIS — E860 Dehydration: Secondary | ICD-10-CM | POA: Diagnosis present

## 2015-08-26 DIAGNOSIS — Z96652 Presence of left artificial knee joint: Secondary | ICD-10-CM | POA: Diagnosis not present

## 2015-08-26 DIAGNOSIS — D573 Sickle-cell trait: Secondary | ICD-10-CM | POA: Diagnosis present

## 2015-08-26 DIAGNOSIS — E1142 Type 2 diabetes mellitus with diabetic polyneuropathy: Secondary | ICD-10-CM | POA: Diagnosis present

## 2015-08-26 DIAGNOSIS — Z91041 Radiographic dye allergy status: Secondary | ICD-10-CM | POA: Diagnosis not present

## 2015-08-26 DIAGNOSIS — D509 Iron deficiency anemia, unspecified: Secondary | ICD-10-CM

## 2015-08-26 DIAGNOSIS — N179 Acute kidney failure, unspecified: Secondary | ICD-10-CM

## 2015-08-26 DIAGNOSIS — I5032 Chronic diastolic (congestive) heart failure: Secondary | ICD-10-CM | POA: Diagnosis not present

## 2015-08-26 DIAGNOSIS — R0989 Other specified symptoms and signs involving the circulatory and respiratory systems: Secondary | ICD-10-CM | POA: Diagnosis not present

## 2015-08-26 DIAGNOSIS — A047 Enterocolitis due to Clostridium difficile: Secondary | ICD-10-CM | POA: Diagnosis not present

## 2015-08-26 DIAGNOSIS — I272 Other secondary pulmonary hypertension: Secondary | ICD-10-CM

## 2015-08-26 DIAGNOSIS — M359 Systemic involvement of connective tissue, unspecified: Secondary | ICD-10-CM | POA: Diagnosis not present

## 2015-08-26 DIAGNOSIS — Z888 Allergy status to other drugs, medicaments and biological substances status: Secondary | ICD-10-CM | POA: Diagnosis not present

## 2015-08-26 DIAGNOSIS — K529 Noninfective gastroenteritis and colitis, unspecified: Secondary | ICD-10-CM | POA: Diagnosis not present

## 2015-08-26 DIAGNOSIS — N183 Chronic kidney disease, stage 3 (moderate): Secondary | ICD-10-CM

## 2015-08-26 DIAGNOSIS — A09 Infectious gastroenteritis and colitis, unspecified: Secondary | ICD-10-CM | POA: Diagnosis not present

## 2015-08-26 DIAGNOSIS — R5383 Other fatigue: Secondary | ICD-10-CM

## 2015-08-26 DIAGNOSIS — Z8249 Family history of ischemic heart disease and other diseases of the circulatory system: Secondary | ICD-10-CM | POA: Diagnosis not present

## 2015-08-26 DIAGNOSIS — M3481 Systemic sclerosis with lung involvement: Secondary | ICD-10-CM | POA: Insufficient documentation

## 2015-08-26 DIAGNOSIS — R4182 Altered mental status, unspecified: Secondary | ICD-10-CM

## 2015-08-26 DIAGNOSIS — I2721 Secondary pulmonary arterial hypertension: Secondary | ICD-10-CM

## 2015-08-26 DIAGNOSIS — R5381 Other malaise: Secondary | ICD-10-CM | POA: Diagnosis not present

## 2015-08-26 DIAGNOSIS — Z881 Allergy status to other antibiotic agents status: Secondary | ICD-10-CM

## 2015-08-26 DIAGNOSIS — I2729 Other secondary pulmonary hypertension: Secondary | ICD-10-CM | POA: Insufficient documentation

## 2015-08-26 LAB — CBC WITH DIFFERENTIAL/PLATELET
BASOS ABS: 0 10*3/uL (ref 0.0–0.1)
BASOS PCT: 0 %
Eosinophils Absolute: 0.1 10*3/uL (ref 0.0–0.7)
Eosinophils Relative: 0 %
HCT: 26.4 % — ABNORMAL LOW (ref 36.0–46.0)
HEMOGLOBIN: 8.7 g/dL — AB (ref 12.0–15.0)
LYMPHS PCT: 8 %
Lymphs Abs: 0.9 10*3/uL (ref 0.7–4.0)
MCH: 24.4 pg — ABNORMAL LOW (ref 26.0–34.0)
MCHC: 33 g/dL (ref 30.0–36.0)
MCV: 73.9 fL — ABNORMAL LOW (ref 78.0–100.0)
MONO ABS: 1.3 10*3/uL — AB (ref 0.1–1.0)
Monocytes Relative: 11 %
NEUTROS ABS: 9.6 10*3/uL — AB (ref 1.7–7.7)
NEUTROS PCT: 81 %
Platelets: 201 10*3/uL (ref 150–400)
RBC: 3.57 MIL/uL — AB (ref 3.87–5.11)
RDW: 17.2 % — AB (ref 11.5–15.5)
WBC: 11.9 10*3/uL — AB (ref 4.0–10.5)

## 2015-08-26 LAB — TSH: TSH: 3.167 u[IU]/mL (ref 0.350–4.500)

## 2015-08-26 LAB — GLUCOSE, CAPILLARY
GLUCOSE-CAPILLARY: 69 mg/dL (ref 65–99)
Glucose-Capillary: 142 mg/dL — ABNORMAL HIGH (ref 65–99)

## 2015-08-26 LAB — COMPREHENSIVE METABOLIC PANEL
ALBUMIN: 3.1 g/dL — AB (ref 3.5–5.0)
ALK PHOS: 87 U/L (ref 38–126)
ALT: 10 U/L — ABNORMAL LOW (ref 14–54)
ANION GAP: 13 (ref 5–15)
AST: 22 U/L (ref 15–41)
BUN: 99 mg/dL — ABNORMAL HIGH (ref 6–20)
CO2: 23 mmol/L (ref 22–32)
Calcium: 9.4 mg/dL (ref 8.9–10.3)
Chloride: 98 mmol/L — ABNORMAL LOW (ref 101–111)
Creatinine, Ser: 3.27 mg/dL — ABNORMAL HIGH (ref 0.44–1.00)
GFR calc Af Amer: 16 mL/min — ABNORMAL LOW (ref >60)
GFR, EST NON AFRICAN AMERICAN: 14 mL/min — AB (ref >60)
Glucose, Bld: 132 mg/dL — ABNORMAL HIGH (ref 65–99)
POTASSIUM: 3.4 mmol/L — AB (ref 3.5–5.1)
Sodium: 134 mmol/L — ABNORMAL LOW (ref 135–145)
TOTAL PROTEIN: 7.9 g/dL (ref 6.5–8.1)
Total Bilirubin: 1.1 mg/dL (ref 0.3–1.2)

## 2015-08-26 LAB — MAGNESIUM: Magnesium: 1.6 mg/dL — ABNORMAL LOW (ref 1.7–2.4)

## 2015-08-26 LAB — LIPASE, BLOOD: LIPASE: 14 U/L (ref 11–51)

## 2015-08-26 MED ORDER — LEVOTHYROXINE SODIUM 25 MCG PO TABS
25.0000 ug | ORAL_TABLET | Freq: Every day | ORAL | Status: DC
  Administered 2015-08-27 – 2015-08-30 (×4): 25 ug via ORAL
  Filled 2015-08-26 (×5): qty 1

## 2015-08-26 MED ORDER — AMBRISENTAN 5 MG PO TABS
10.0000 mg | ORAL_TABLET | Freq: Every day | ORAL | Status: DC
  Administered 2015-08-27 – 2015-08-30 (×3): 10 mg via ORAL
  Filled 2015-08-26 (×4): qty 2

## 2015-08-26 MED ORDER — BOOST / RESOURCE BREEZE PO LIQD
1.0000 | Freq: Three times a day (TID) | ORAL | Status: DC
  Administered 2015-08-26 – 2015-08-30 (×6): 1 via ORAL

## 2015-08-26 MED ORDER — RIOCIGUAT 1 MG PO TABS: 1.0000 mg | ORAL_TABLET | Freq: Three times a day (TID) | ORAL | Status: DC

## 2015-08-26 MED ORDER — POTASSIUM CHLORIDE IN NACL 20-0.9 MEQ/L-% IV SOLN
INTRAVENOUS | Status: DC
  Administered 2015-08-26: 20:00:00 via INTRAVENOUS
  Filled 2015-08-26: qty 1000

## 2015-08-26 MED ORDER — DIATRIZOATE MEGLUMINE & SODIUM 66-10 % PO SOLN
ORAL | Status: AC
  Filled 2015-08-26: qty 30

## 2015-08-26 MED ORDER — PANTOPRAZOLE SODIUM 40 MG PO TBEC
80.0000 mg | DELAYED_RELEASE_TABLET | Freq: Every day | ORAL | Status: DC
Start: 2015-08-27 — End: 2015-08-30
  Administered 2015-08-27 – 2015-08-30 (×4): 80 mg via ORAL
  Filled 2015-08-26 (×4): qty 2

## 2015-08-26 MED ORDER — AMIODARONE HCL 200 MG PO TABS
200.0000 mg | ORAL_TABLET | Freq: Every day | ORAL | Status: DC
  Administered 2015-08-27 – 2015-08-30 (×4): 200 mg via ORAL
  Filled 2015-08-26 (×4): qty 1

## 2015-08-26 MED ORDER — SELEXIPAG 1600 MCG PO TABS
1600.0000 ug | ORAL_TABLET | Freq: Two times a day (BID) | ORAL | Status: DC
  Administered 2015-08-27 – 2015-08-30 (×7): 1600 ug via ORAL
  Filled 2015-08-26 (×5): qty 1

## 2015-08-26 MED ORDER — ONDANSETRON HCL 4 MG/2ML IJ SOLN: 4.0000 mg | Freq: Four times a day (QID) | INTRAMUSCULAR | Status: DC | PRN

## 2015-08-26 MED ORDER — ONDANSETRON HCL 4 MG PO TABS: 4.0000 mg | ORAL_TABLET | Freq: Four times a day (QID) | ORAL | Status: DC | PRN

## 2015-08-26 MED ORDER — POTASSIUM CHLORIDE IN NACL 20-0.9 MEQ/L-% IV SOLN
INTRAVENOUS | Status: DC
  Filled 2015-08-26: qty 1000

## 2015-08-26 NOTE — Telephone Encounter (Signed)
Hr 73 resp 16  SAT% 92 bp 100/50 bil diminshed breath sounds in lower lobes abd pain 7/10 mid abd Weak Per care connection RN, she desires for pt to be seen, she is very worried appt 1415 dr Denton Brick

## 2015-08-26 NOTE — H&P (Signed)
Date: 08/26/2015               Patient Name:  [name] MRN: [id number]  DOB: 1950/08/09 Age / Sex: 65 y.o., female   PCP: Sid Falcon, MD         Medical Service: Internal Medicine Teaching Service         Attending Physician: Dr. Annia Belt, MD    First Contact: Dr. Zada Finders Pager: [phone]  Second Contact: Dr. Charlott Rakes Pager: [phone]       After Hours (After 5p/  First Contact Pager: [phone]  weekends / holidays): Second Contact Pager: [phone]   Chief Complaint: "I'm just tired"  History of Present Illness: [name] is a 65 year old lady with PMH of Systemic Sclerosis with secondary Pulmonary HTN, T2DM, CKD, CHF, PAF (not on A/C 2/2 GI bleeds), HTN, Iron Deficiency Anemia, and Sickle Cell Trait who presented to [facility] with lethargy and decreased appetite.  Over the past weekend, she started feeling tired and had a decreased appetite. She only had a banana and soda yesterday which is different from her usual state of health. She has also felt nauseous without emesis during this interval with some loose, non-bloody bowel movements. She takes Norco at home for pain and reports taking it as prescribed, at least three times a day. She does acknowledge taking diuretics: 3 pills on Monday and 3 pills in the afternoon.   She denies abdominal pain, difficulty breathing, coughing, chest pain, headaches, urinary frequency, dysuria, leg swelling. No assistance with ADLs at her baseline. She lives with her granddaughter who is present and confirms patient's changes from baseline. Family states that she did have some baked chicken a couple days ago at [employer], which the family also ate. No sick contacts.  In clinic, she was noted to be hypotensive at 92/40 and blood work revealed an elevated SCr of 3.27 from her baseline of 1.5-1.8. CBC showed a white count of 11.9 and Hgb 8.7 (at baseline). CXR was read as cardiomegaly with pulmonary vascular congestion. A CT  abdomen was obtained which was notable for proximal colitis.     Meds: Current Facility-Administered Medications  Medication Dose Route Frequency Provider Last Rate Last Dose  . 0.9 % NaCl with KCl 20 mEq/ L  infusion   Intravenous Continuous Zada Finders, MD      . Derrill Memo ON 08/27/2015] ambrisentan (LETAIRIS) tablet 10 mg  10 mg Oral Daily Riccardo Dubin, MD      . Derrill Memo ON 08/27/2015] amiodarone (PACERONE) tablet 200 mg  200 mg Oral Daily Riccardo Dubin, MD      . Derrill Memo ON 08/27/2015] levothyroxine (SYNTHROID, LEVOTHROID) tablet 25 mcg  25 mcg Oral QAC breakfast Riccardo Dubin, MD      . ondansetron (ZOFRAN) tablet 4 mg  4 mg Oral Q6H PRN Riccardo Dubin, MD       Or  . ondansetron (ZOFRAN) injection 4 mg  4 mg Intravenous Q6H PRN Riccardo Dubin, MD      . Derrill Memo ON 08/27/2015] pantoprazole (PROTONIX) EC tablet 80 mg  80 mg Oral Q1200 Riccardo Dubin, MD      . Riociguat TABS 1 mg  1 mg Oral TID Riccardo Dubin, MD      . Selexipag TABS 1,600 mcg  1,600 mcg Oral BID Riccardo Dubin, MD       Facility-Administered Medications Ordered in Other Encounters  Medication Dose Route  Frequency Provider Last Rate Last Dose  . diatrizoate meglumine-sodium (GASTROGRAFIN) 66-10 % solution             Allergies: Allergies as of 08/26/2015 - Review Complete 08/26/2015  Allergen Reaction Noted  . Cephalexin Rash 05/01/2010  . Ciprofloxacin Rash 07/23/2010  . Codeine Other (See Comments)   . Contrast media [iodinated diagnostic agents] Hives 08/03/2011  . Iohexol Hives 03/04/2008   Past Medical History  Diagnosis Date  . Secondary pulmonary hypertension ([place])     a. 2/2 scleroderma  . GERD (gastroesophageal reflux disease)   . Systemic sclerosis ([place])   . Unspecified essential hypertension   . Gastroparesis   . Sickle cell trait ([place])   . Obesity   . Scleroderma ([place])   . Trichomonas   . Peripheral neuropathy ([place])   . Type II diabetes mellitus (HCC)     a. diet control   . Arthritis   .  Chronic kidney disease   . Gout   . Hypothyroidism     a. may be due to amiodarone use. started on synthroid on 12/2014 admission   . Anemia     a.  hx of GI bleed and duodenal AVMs  . Chronic diastolic CHF (congestive heart failure) ([place])     a. Echo 12/23/14 withEF 60-65%, moderately dilated RV, PASP 72, no pericardial effusion.  Marland Kitchen PAF (paroxysmal atrial fibrillation) (HCC)     a. not a long term AC candidate due to GI bleeds  . Angiodysplasia of colon with hemorrhage   . Dysrhythmia   . Transfusion history     last 06-21-15   Past Surgical History  Procedure Laterality Date  . Tubal ligation    . Esophagogastroduodenoscopy  02/23/2010    D 2 AVM, ablated with APC.   Marland Kitchen Shoulder arthroscopy Right 12/2009    subacromial decompression  . Replacement total knee Left 11/2000  . Cardiac catheterization Right 04/24/2004  . Tonsillectomy  1960's  . Abdominal hysterectomy  1983    "partial"  . Shoulder arthroscopy w/ rotator cuff repair Right 01/2010  . Excisional total knee arthroplasty with antibiotic spacers Left 08/2010    "got infected & had to take 1st replacement out"  . Revision total knee arthroplasty Left 11/2010    "removed spacers; replaced knee"  . Total knee revision with scar debridement/patella revision with poly exchange Left 12/2010    fell; knee split opened; had to redo revision"  . Cardiac catheterization Left 07/2010  . Peripherally inserted central catheter insertion  09/2010  . Colonoscopy  09/06/2008, 09/2013    int rrhoids:Dr. Lajoyce Corners 2010. Pan-colonic AVMs, microscopic colitis per Dr Deatra Ina 2015  . Right heart catheterization N/A 03/18/2014    Procedure: RIGHT HEART CATH;  Surgeon: Larey Dresser, MD;  Location: [facility] CATH LAB;  Service: Cardiovascular;  Laterality: N/A;  . Esophagogastroduodenoscopy N/A 03/23/2014    Procedure: ESOPHAGOGASTRODUODENOSCOPY (EGD);  Surgeon: Ladene Artist, MD;  Location: [facility] ENDOSCOPY;  Service: Endoscopy;  Laterality: N/A;  . Pericardial tap  N/A 05/03/2014    Procedure: PERICARDIAL TAP;  Surgeon: Sinclair Grooms, MD;  Location: [facility] CATH LAB;  Service: Cardiovascular;  Laterality: N/A;  . Esophagogastroduodenoscopy N/A 02/21/2015    Procedure: ESOPHAGOGASTRODUODENOSCOPY (EGD);  Surgeon: Ladene Artist, MD;  Location: [facility] ENDOSCOPY;  Service: Endoscopy;  Laterality: N/A;  . Colonoscopy N/A 02/24/2015    Procedure: COLONOSCOPY;  Surgeon: Gatha Mayer, MD;  Location: [place];  Service: Endoscopy;  Laterality: N/A;  . Enteroscopy  N/A 05/14/2015    Procedure: ENTEROSCOPY;  Surgeon: Irene Shipper, MD;  Location: [place];  Service: Endoscopy;  Laterality: N/A;  . Enteroscopy N/A 06/22/2015    Procedure: ENTEROSCOPY;  Surgeon: Doran Stabler, MD;  Location: [facility] END[facility]COPY;  Service: Endoscopy;  Laterality: N/A;  . Hot hemostasis  06/22/2015    Procedure: HOT HEMOSTASIS (ARGON PLASMA COAGULATION/BICAP);  Surgeon: Doran Stabler, MD;  Location: [facility] ENDOSCOPY;  Service: Endoscopy;;   Family History  Problem Relation Age of Onset  . Heart disease Mother   . Diabetes Mother   . Diabetes Sister    Social History   Social History  . Marital Status: Legally Separated    Spouse Name: N/A  . Number of Children: 2  . Years of Education: 11   Occupational History  . Umemployed   . Disability     Social History Main Topics  . Smoking status: Never Smoker   . Smokeless tobacco: Never Used  . Alcohol Use: No  . Drug Use: No  . Sexual Activity: No   Other Topics Concern  . Not on file   Social History Narrative    FAMILY HISTORY:  Significant for coronary artery disease and diabetes   Patient lives at home with granddaughter.    Patient has 2 children.    Patient has 11 years of education.    Patient is on disability.    Patient is right handed.    Patient is separated.      Review of Systems: Review of Systems  Constitutional: Positive for chills and malaise/fatigue. Negative for fever and diaphoresis.  Respiratory:  Negative for cough, shortness of breath and wheezing.   Cardiovascular: Negative for chest pain and leg swelling.  Gastrointestinal: Positive for nausea and abdominal pain. Negative for vomiting and blood in stool.       Soft stools  Genitourinary: Negative for dysuria, frequency and hematuria.  Musculoskeletal: Negative for myalgias and falls.  Neurological: Positive for weakness. Negative for dizziness, focal weakness and loss of consciousness.     Physical Exam: There were no vitals taken for this visit. Physical Exam  Constitutional: She is oriented to person, place, and time.  Thin woman, lethargic, resting in a wheelchair. Exam limited as patient remained in wheelchair.  HENT:  Head: Normocephalic and atraumatic.  Dry oropharynx  Eyes: Conjunctivae and EOM are normal.  Cardiovascular: Normal rate and regular rhythm.   Murmur heard.  Systolic murmur is present  Pulmonary/Chest: Effort normal. She has no wheezes.  Bibasilar crackles  Abdominal: Soft. She exhibits no distension. There is no tenderness.  Musculoskeletal: She exhibits no edema or tenderness.  Neurological: She is alert and oriented to person, place, and time.  Skin: Skin is warm.    Lab results: Basic Metabolic Panel:  Recent Labs  08/26/15 1533  NA 134*  K 3.4*  CL 98*  CO2 23  GLUCOSE 132*  BUN 99*  CREATININE 3.27*  CALCIUM 9.4  MG 1.6*   Liver Function Tests:  Recent Labs  08/26/15 1533  AST 22  ALT 10*  ALKPHOS 87  BILITOT 1.1  PROT 7.9  ALBUMIN 3.1*    Recent Labs  08/26/15 1533  LIPASE 14   No results for input(s): AMMONIA in the last 72 hours. CBC:  Recent Labs  08/26/15 1533  WBC 11.9*  NEUTROABS 9.6*  HGB 8.7*  HCT 26.4*  MCV 73.9*  PLT 201   Cardiac Enzymes: No results for input(s): CKTOTAL,  CKMB, CKMBINDEX, TROPONINI in the last 72 hours. BNP: No results for input(s): PROBNP in the last 72 hours. D-Dimer: No results for input(s): DDIMER in the last 72  hours. CBG:  Recent Labs  08/26/15 1501  GLUCAP 142*   Hemoglobin A1C: No results for input(s): HGBA1C in the last 72 hours. Fasting Lipid Panel: No results for input(s): CHOL, HDL, LDLCALC, TRIG, CHOLHDL, LDLDIRECT in the last 72 hours. Thyroid Function Tests:  Recent Labs  08/26/15 1533  TSH 3.167   Anemia Panel: No results for input(s): VITAMINB12, FOLATE, FERRITIN, TIBC, IRON, RETICCTPCT in the last 72 hours. Coagulation: No results for input(s): LABPROT, INR in the last 72 hours. Urine Drug Screen: Drugs of Abuse     Component Value Date/Time   LABOPIA POSITIVE* 10/02/2010 1128   COCAINSCRNUR NONE DETECTED 10/02/2010 1128   LABBENZ NONE DETECTED 10/02/2010 1128   AMPHETMU NONE DETECTED 10/02/2010 1128   THCU NONE DETECTED 10/02/2010 1128   LABBARB NONE DETECTED 10/02/2010 1128    Alcohol Level: No results for input(s): ETH in the last 72 hours. Urinalysis: No results for input(s): COLORURINE, LABSPEC, PHURINE, GLUCOSEU, HGBUR, BILIRUBINUR, KETONESUR, PROTEINUR, UROBILINOGEN, NITRITE, LEUKOCYTESUR in the last 72 hours.  Invalid input(s): APPERANCEUR   Imaging results:  Ct Abdomen Pelvis Wo Contrast  08/26/2015  CLINICAL DATA:  Altered mental status. Generalized abdominal pain. Diarrhea. EXAM: CT ABDOMEN AND PELVIS WITHOUT CONTRAST TECHNIQUE: Multidetector CT imaging of the abdomen and pelvis was performed following the standard protocol without IV contrast. COMPARISON:  05/12/2015 FINDINGS: Lower chest and abdominal wall:  Chronic cardiomegaly. Hepatobiliary: Scattered hepatic cysts. No acute finding.No evidence of biliary obstruction or stone. Pancreas: Generalized atrophy without acute finding. Spleen: Unremarkable. Adrenals/Urinary Tract: Negative adrenals. No hydronephrosis or stone. Unremarkable bladder. Reproductive:Hysterectomy.  No adnexal mass. Stomach/Bowel: Proximal predominant colonic mural thickening with mesocolonic edema. These inflammatory changes are  not convincingly seen beyond the mid transverse segment. The appendix is difficult to visualize. No pericecal inflammation noted. Vascular/Lymphatic: Atherosclerosis without acute finding. No mass or adenopathy. Peritoneal: No ascites or pneumoperitoneum. Musculoskeletal: No acute abnormalities. Chronic bilateral sacroiliitis. Chronic lower disc bulging and lower lumbar facet arthropathy IMPRESSION: Proximal colitis, likely infectious. Chronic findings are stable from prior and described above. Electronically Signed   By: Monte Fantasia M.D.   On: 08/26/2015 17:51   Dg Chest 2 View  08/26/2015  CLINICAL DATA:  Altered mental status, mid abdomen pain and weakness, crackles at the lung bases EXAM: CHEST  2 VIEW COMPARISON:  Portable chest x-ray of 02/20/2015 and two-view chest x-ray of 12/24/2014 FINDINGS: There is moderate cardiomegaly present with pulmonary vascular congestion most consistent with mild congestive heart failure. Very small pleural effusions cannot be excluded. No focal infiltrate is seen. No bony abnormality is noted. IMPRESSION: Suspect mild CHF with moderate cardiomegaly and pulmonary vascular congestion. Electronically Signed   By: Ivar Drape M.D.   On: 08/26/2015 16:54    Other results: EKG: n/a.  Assessment & Plan by Problem: 65 year old lady with PMH of Systemic Sclerosis with secondary Pulmonary HTN, T2DM, CKD, CHF, PAF (not on A/C 2/2 GI bleeds), HTN, Iron Deficiency Anemia, and Sickle Cell Trait presenting with Colitis and AKI.   Proximal Colitis: Patient with decreased appetite, diarrhea, leukocytosis, and CT findings showing proximal colitis. This is likely infectious in nature, viral vs bacterial. Will continue with supportive measures and hold off on antibiotics at this time. -NS @ 75 with KCL 20 mEq mL/hr -Zofran prn -Check GI pathogen panel  AKI on CKD 3: SCr increased from baseline (1.5-1.8) at 3.27, likely secondary to GI illness and decreased oral  intake. -Gentle IVF as above in setting of CHF -BMET in AM  Scleroderma with Pulmonary HTN:  -Continue home Selexipag 1600 mcg po BID -Continue home Ambrisentan 10 mg po daily -Continue home Riociguat 1 mg TID  Chronic Diastolic CHF: Last echo in Oct 2016 with EF 33-48%, grade 2 diastolic dysfunction. She follows with Dr. Aundra Dubin as an outpatient. She is on Metolazone 2.5 mg twice weekly and Torsemide 60 mg BID. -Hold diuretics in setting of volume depletion  Hypothyroidism: TSH 3.167. She takes Synthroid 25 mcg daily at home. -Continue home Synthroid   Paroxysmal AFib: On amiodarone 200 mg daily. Not on anticoagulation due to history of GI bleeding. In NSR currently. - Continue home Amiodarone 200 mg daily  Type 2 DM: Diet controlled. Last HbA1c 5.4 in 08/04/15 - Monitor CBGs on bmet  Diet: Clear  DVT ppx: SCDs  Code: DNR (confirmed with family)   Dispo: Disposition is deferred at this time, awaiting improvement of current medical problems. Anticipated discharge in approximately 1-3 day(s).   The patient does have a current PCP Sid Falcon, MD) and does need an [facility] hospital follow-up appointment after discharge.  The patient does have transportation limitations that hinder transportation to clinic appointments.  Signed: Zada Finders, MD 08/26/2015, 8:16 PM

## 2015-08-26 NOTE — Telephone Encounter (Signed)
Agree with appt.; Thanks

## 2015-08-26 NOTE — Progress Notes (Signed)
Patient ID: PRISMA DECARLO, female   DOB: [birth year], 65 y.o.   MRN: [id number]  Subjective:   Patient ID: [name] female   DOB: 10/03/1950 65 y.o.   MRN: [id number]  HPI: [name] is a 65 y.o. with PMH listed below. Presented today for an acute visit with c/o AMS, abdominal pain and loose stools.  Pt was seen by her [facility] nurse, who thought patient was lethargic, and sleepy, which was very different for pt, and recommend pt see her PCP. Pt says that over the past 2 days she has been sleepy, tired and has had no appetite. Abdominal pain started 2 days ago, generalized, 6/10 intensity. She ate out- [employer], but family also ate same meal and felt fine. Pt denies dysuria. Last meal- 2 days ago- banana and Soda.  She also had 2 loose bowel movements on Sunday, Monday, and 1 today. No vomiting.  She denies fevers, chills, no chest pain, no SOB, no cough, no headaches.  She has been taking all her medications, she can not tell me what specifically.  Past Medical History  Diagnosis Date  . Secondary pulmonary hypertension ([place])     a. 2/2 scleroderma  . GERD (gastroesophageal reflux disease)   . Systemic sclerosis ([place])   . Unspecified essential hypertension   . Gastroparesis   . Sickle cell trait ([place])   . Obesity   . Scleroderma ([place])   . Trichomonas   . Peripheral neuropathy ([employer])   . Type II diabetes mellitus (HCC)     a. diet control   . Arthritis   . Chronic kidney disease   . Gout   . Hypothyroidism     a. may be due to amiodarone use. started on synthroid on 12/2014 admission   . Anemia     a.  hx of GI bleed and duodenal AVMs  . Chronic diastolic CHF (congestive heart failure) ([place])     a. Echo 12/23/14 withEF 60-65%, moderately dilated RV, PASP 72, no pericardial effusion.  Marland Kitchen PAF (paroxysmal atrial fibrillation) (HCC)     a. not a long term AC candidate due to GI bleeds  . Angiodysplasia of colon with hemorrhage   . Dysrhythmia   . Transfusion history     last 06-21-15     Review of Systems: SKIN- No Rash, colour changes or itching. HEAD- No Headache or dizziness. Mouth/throat- No Sorethroat, or bleeding gums. RESPIRATORY- No Cough or SOB. CARDIAC- No Palpitations, or chest pain. URINARY- No dysuria.  Objective:  Physical Exam: Filed Vitals:   08/26/15 1445  BP: 92/40  Pulse: 73  Temp: 99.1 F (37.3 C)  TempSrc: Oral  Height: _0  (1.651 m)  Weight: 146 lb (66.225 kg)  SpO2: 98%   GENERAL- lethargic, difficulty answering questions, appears older than stated age, HEENT- Atraumatic, normocephalic, PERRL, EOMI, oral mucosa appears dry CARDIAC- Regular, 2/6 systolic mumur- area, no rubs or gallops. RESP- Moving equal volumes of air, and bilat basilar crackles. ABDOMEN- Soft, appears distended, tenderness all 4 quadrants, reduced bowel sounds, tympanitic abd.  BACK-mild CVA tenderness, worse on right. NEURO- No obvious Cr N abnormality,  EXTREMITIES- warm, no pedal edema. SKIN- Warm, dry, No rash or lesion. PSYCH- Normal mood and affect, appropriate thought content and speech.  Assessment & Plan:   The patient's case and plan of care was discussed with attending physician, Dr. Dareen Piano.  Abdominal Pain, Lethargy- With loose stools, tenderness, reduced bowel sounds, concern for colitis- infectious. Possibly viral Vs bact.  Also poor Po intake, dry mucus membranes. She does not appears volume overloaded- stable weight, no pedal edema. - CMEt, check Cr, electrolytes, metolazone was resumed and added to torsemide- 07/29/2015, also with loose stools. - CBC- check for leukocytosis - UA - Lipase - TSH, as pt is lethargic. - Abd CT Wo Contrast - Chest Xray - CBG- 140s. - ?medications  - Labs showed AKI with AMS- lethargy, will recommend admission.  IMTS agreed to admit.

## 2015-08-27 DIAGNOSIS — I2729 Other secondary pulmonary hypertension: Secondary | ICD-10-CM | POA: Insufficient documentation

## 2015-08-27 DIAGNOSIS — M349 Systemic sclerosis, unspecified: Secondary | ICD-10-CM | POA: Diagnosis not present

## 2015-08-27 DIAGNOSIS — E860 Dehydration: Secondary | ICD-10-CM

## 2015-08-27 DIAGNOSIS — I272 Other secondary pulmonary hypertension: Secondary | ICD-10-CM

## 2015-08-27 DIAGNOSIS — M3481 Systemic sclerosis with lung involvement: Secondary | ICD-10-CM | POA: Insufficient documentation

## 2015-08-27 DIAGNOSIS — R509 Fever, unspecified: Secondary | ICD-10-CM | POA: Diagnosis not present

## 2015-08-27 DIAGNOSIS — E1122 Type 2 diabetes mellitus with diabetic chronic kidney disease: Secondary | ICD-10-CM

## 2015-08-27 DIAGNOSIS — K529 Noninfective gastroenteritis and colitis, unspecified: Secondary | ICD-10-CM | POA: Diagnosis not present

## 2015-08-27 DIAGNOSIS — D509 Iron deficiency anemia, unspecified: Secondary | ICD-10-CM | POA: Insufficient documentation

## 2015-08-27 DIAGNOSIS — N179 Acute kidney failure, unspecified: Secondary | ICD-10-CM

## 2015-08-27 DIAGNOSIS — K31819 Angiodysplasia of stomach and duodenum without bleeding: Secondary | ICD-10-CM | POA: Insufficient documentation

## 2015-08-27 DIAGNOSIS — M359 Systemic involvement of connective tissue, unspecified: Secondary | ICD-10-CM | POA: Diagnosis not present

## 2015-08-27 DIAGNOSIS — N183 Chronic kidney disease, stage 3 (moderate): Secondary | ICD-10-CM

## 2015-08-27 DIAGNOSIS — I480 Paroxysmal atrial fibrillation: Secondary | ICD-10-CM

## 2015-08-27 DIAGNOSIS — E039 Hypothyroidism, unspecified: Secondary | ICD-10-CM

## 2015-08-27 LAB — CBC
HEMATOCRIT: 25.6 % — AB (ref 36.0–46.0)
HEMOGLOBIN: 8.4 g/dL — AB (ref 12.0–15.0)
MCH: 23.8 pg — AB (ref 26.0–34.0)
MCHC: 32.8 g/dL (ref 30.0–36.0)
MCV: 72.5 fL — ABNORMAL LOW (ref 78.0–100.0)
Platelets: 217 10*3/uL (ref 150–400)
RBC: 3.53 MIL/uL — AB (ref 3.87–5.11)
RDW: 17.1 % — ABNORMAL HIGH (ref 11.5–15.5)
WBC: 9.7 10*3/uL (ref 4.0–10.5)

## 2015-08-27 LAB — GASTROINTESTINAL PANEL BY PCR, STOOL (REPLACES STOOL CULTURE)
ADENOVIRUS F40/41: NOT DETECTED
Astrovirus: NOT DETECTED
CAMPYLOBACTER SPECIES: NOT DETECTED
CRYPTOSPORIDIUM: NOT DETECTED
CYCLOSPORA CAYETANENSIS: NOT DETECTED
E. COLI O157: NOT DETECTED
ENTEROPATHOGENIC E COLI (EPEC): NOT DETECTED
Entamoeba histolytica: NOT DETECTED
Enteroaggregative E coli (EAEC): NOT DETECTED
Enterotoxigenic E coli (ETEC): NOT DETECTED
Giardia lamblia: NOT DETECTED
Norovirus GI/GII: NOT DETECTED
PLESIMONAS SHIGELLOIDES: NOT DETECTED
ROTAVIRUS A: NOT DETECTED
SALMONELLA SPECIES: NOT DETECTED
SAPOVIRUS (I, II, IV, AND V): NOT DETECTED
SHIGA LIKE TOXIN PRODUCING E COLI (STEC): NOT DETECTED
SHIGELLA/ENTEROINVASIVE E COLI (EIEC): NOT DETECTED
Vibrio cholerae: NOT DETECTED
Vibrio species: NOT DETECTED
YERSINIA ENTEROCOLITICA: NOT DETECTED

## 2015-08-27 LAB — BASIC METABOLIC PANEL
ANION GAP: 13 (ref 5–15)
BUN: 96 mg/dL — ABNORMAL HIGH (ref 6–20)
CHLORIDE: 99 mmol/L — AB (ref 101–111)
CO2: 22 mmol/L (ref 22–32)
Calcium: 9.2 mg/dL (ref 8.9–10.3)
Creatinine, Ser: 2.75 mg/dL — ABNORMAL HIGH (ref 0.44–1.00)
GFR calc non Af Amer: 17 mL/min — ABNORMAL LOW (ref >60)
GFR, EST AFRICAN AMERICAN: 20 mL/min — AB (ref >60)
Glucose, Bld: 102 mg/dL — ABNORMAL HIGH (ref 65–99)
POTASSIUM: 3.2 mmol/L — AB (ref 3.5–5.1)
SODIUM: 134 mmol/L — AB (ref 135–145)

## 2015-08-27 LAB — GLUCOSE, CAPILLARY
GLUCOSE-CAPILLARY: 116 mg/dL — AB (ref 65–99)
GLUCOSE-CAPILLARY: 103 mg/dL — AB (ref 65–99)
Glucose-Capillary: 135 mg/dL — ABNORMAL HIGH (ref 65–99)
Glucose-Capillary: 107 mg/dL — ABNORMAL HIGH (ref 65–99)
Glucose-Capillary: 105 mg/dL — ABNORMAL HIGH (ref 65–99)

## 2015-08-27 MED ORDER — MAGNESIUM SULFATE 2 GM/50ML IV SOLN
2.0000 g | Freq: Once | INTRAVENOUS | Status: AC
  Administered 2015-08-27: 2 g via INTRAVENOUS
  Filled 2015-08-27: qty 50

## 2015-08-27 MED ORDER — PIPERACILLIN-TAZOBACTAM IN DEX 2-0.25 GM/50ML IV SOLN
2.2500 g | Freq: Three times a day (TID) | INTRAVENOUS | Status: DC
  Administered 2015-08-27 – 2015-08-28 (×3): 2.25 g via INTRAVENOUS
  Filled 2015-08-27 (×5): qty 50

## 2015-08-27 MED ORDER — RIOCIGUAT 2 MG PO TABS
2.0000 mg | ORAL_TABLET | Freq: Three times a day (TID) | ORAL | Status: DC
  Administered 2015-08-27 – 2015-08-30 (×10): 2 mg via ORAL
  Filled 2015-08-27 (×7): qty 1

## 2015-08-27 MED ORDER — POTASSIUM CHLORIDE IN NACL 40-0.9 MEQ/L-% IV SOLN
INTRAVENOUS | Status: AC
  Administered 2015-08-27: 75 mL/h via INTRAVENOUS
  Filled 2015-08-27: qty 1000

## 2015-08-27 MED ORDER — POTASSIUM CHLORIDE 20 MEQ/15ML (10%) PO SOLN
40.0000 meq | Freq: Once | ORAL | Status: AC
  Administered 2015-08-27: 40 meq via ORAL
  Filled 2015-08-27: qty 30

## 2015-08-27 MED ORDER — ACETAMINOPHEN 325 MG PO TABS
650.0000 mg | ORAL_TABLET | Freq: Four times a day (QID) | ORAL | Status: DC | PRN
  Administered 2015-08-27 – 2015-08-29 (×3): 650 mg via ORAL
  Filled 2015-08-27 (×2): qty 2

## 2015-08-27 NOTE — Progress Notes (Signed)
Initial Nutrition Assessment  DOCUMENTATION CODES:   Non-severe (moderate) malnutrition in context of chronic illness  INTERVENTION:  Continue Boost Breeze po TID, each supplement provides 250 kcal and 9 grams of protein.  Encourage adequate PO intake.   NUTRITION DIAGNOSIS:   Malnutrition related to chronic illness as evidenced by moderate depletions of muscle mass, moderate depletion of body fat.  GOAL:   Patient will meet greater than or equal to 90% of their needs  MONITOR:   PO intake, Supplement acceptance, Diet advancement, Weight trends, Labs, I & O's  REASON FOR ASSESSMENT:   Malnutrition Screening Tool    ASSESSMENT:   [name] is a 65 year old lady with PMH of Systemic Sclerosis with secondary Pulmonary HTN, T2DM, CKD, CHF, PAF (not on A/C 2/2 GI bleeds), HTN, Iron Deficiency Anemia, and Sickle Cell Trait who presented to [facility] with lethargy and decreased appetite. A CT abdomen was obtained which was notable for proximal colitis.    Pt lethargic during time of visit. Pt did not respond to questions asked and occasionally would just moan. No family at bedside. RD unable to obtain most recent nutrition history. Per MD note, pt with a decreased appetite and reported she was only to consume a banana and a soda the day prior to admission. Per Epic weight records, pt with a 7% weight loss in 2 months. Pt is currently on a clear liquid diet. Meal completion recorded to be 0%. Pt currently has Boost Breeze ordered. RD to continue with current orders.   Nutrition-Focused physical exam completed. Findings are moderate fat depletion, moderate muscle depletion, and no edema.   Labs and medications reviewed.   Diet Order:  Diet clear liquid Room service appropriate?: Yes; Fluid consistency:: Thin  Skin:  Reviewed, no issues  Last BM:  6/12  Height:   Ht Readings from Last 1 Encounters:  08/26/15 _0  (1.651 m)    Weight:   Wt Readings from Last 1 Encounters:   08/26/15 146 lb (66.225 kg)    Ideal Body Weight:  56.8 kg  BMI:  There is no weight on file to calculate BMI.  Estimated Nutritional Needs:   Kcal:  1700-1850  Protein:  75-85 grams  Fluid:  1.7 - 1.8 L/day  EDUCATION NEEDS:   No education needs identified at this time  Corrin Parker, MS, RD, LDN Pager # [phone] After hours/ weekend pager # [phone]

## 2015-08-27 NOTE — Progress Notes (Signed)
Internal Medicine Clinic Attending  Case discussed with Dr. Denton Brick at the time of the visit.  We reviewed the resident's history and exam and pertinent patient test results.  I agree with the assessment, diagnosis, and plan of care documented in the resident's note.

## 2015-08-27 NOTE — Progress Notes (Addendum)
Pharmacy Antibiotic Note  [name] is a 65 y.o. female admitted on 08/26/2015 with intra-abdominal infection.  Pharmacy has been consulted for Zosyn dosing. AKI - SCr improving to 2.75, CrCl~18. Tmax/24h 102.7, wbc to wnl. Discussed Keflex rash allergy w/ MD and ok to proceed w/ abx.  Plan: Zosyn 2.25g IV q8h Monitor clinical progress, c/s, renal function, abx plan/LOT     Temp (24hrs), Avg:100 F (37.8 C), Min:98.3 F (36.8 C), Max:102.7 F (39.3 C)   Recent Labs Lab 08/22/15 1028 08/26/15 1533 08/27/15 0516  WBC 3.9 11.9* 9.7  CREATININE  --  3.27* 2.75*    Estimated Creatinine Clearance: 18.8 mL/min (by C-G formula based on Cr of 2.75).    Allergies  Allergen Reactions  . Cephalexin Rash  . Ciprofloxacin Rash  . Codeine Other (See Comments)     GI upset  . Contrast Media [Iodinated Diagnostic Agents] Hives  . Iohexol Hives     Code: HIVES, Desc: pt breaks out in large hives. needs full premeds, Onset Date: [id number]     Antimicrobials this admission: 6/14 zosyn >>   Dose adjustments this admission:   Microbiology results: 6/14 BCx:  6/14 GI panel:  Elicia Lamp, PharmD, [facility] Clinical Pharmacist Pager [phone] 08/27/2015 9:18 AM

## 2015-08-27 NOTE — Progress Notes (Signed)
Received pt via wheelchair from the medical clinic, alert and oriented x 2 but very sleepy. Pt oriented to unit and room, call bell within reach. Will cont to monitor.

## 2015-08-27 NOTE — Care Management Obs Status (Signed)
MEDICARE OBSERVATION STATUS NOTIFICATION   Patient Details  Name: SHANIKIA KERNODLE MRN: [id number] Date of Birth: 16-Jul-1950   Medicare Observation Status Notification Given:  Other (see comment) (Attempted to deliver, pt sleeping and persons in room not closely related. )    Keifer Habib, Rory Percy, RN 08/27/2015, 3:55 PM

## 2015-08-27 NOTE — Progress Notes (Signed)
Pt had a temp of 102.7, MD notified , tylenol given as ordered.

## 2015-08-27 NOTE — Progress Notes (Signed)
Subjective: Patient is very sleepy when seen this morning. She reports continued loose stools overnight. She had a fever of 102.7 overnight as well.  Objective: Vital signs in last 24 hours: Filed Vitals:   08/26/15 2031 08/27/15 0530 08/27/15 0810  BP: 110/47 92/33 98/42  Pulse: 105 72 73  Temp: 98.3 F (36.8 C) 102.7 F (39.3 C) 101.6 F (38.7 C)  TempSrc:   Oral  Resp: _0 SpO2: 96% 92% 98%   Weight change:   Intake/Output Summary (Last 24 hours) at 08/27/15 1159 Last data filed at 08/27/15 0900  Gross per 24 hour  Intake     60 ml  Output      0 ml  Net     60 ml   General: resting in bed, lethargic Cardiac: RRR, no rubs, murmurs or gallops appreciated using disposable stethoscope Pulm: bibasilar rales heard Abd: soft, mild tenderness at RLQ, nondistended Ext: warm and well perfused, no pedal edema    Assessment/Plan: Active Problems:   DM type 2 with diabetic peripheral neuropathy ([place])  65 year old lady with PMH of Systemic Sclerosis with secondary Pulmonary HTN, T2DM, CKD, CHF, PAF (not on A/C 2/2 GI bleeds), HTN, Iron Deficiency Anemia, and Sickle Cell Trait presenting with Colitis and AKI.   Fever with Proximal Colitis on imaging: Patient with decreased appetite, diarrhea, leukocytosis, and CT findings showing proximal colitis. This is likely infectious in nature, viral vs bacterial. Patient had fever spikes overnight with Tmax of 102.7. She is likely to easily decompensate, so we will initiate broad-spectrum antibiotics at this time. Blood cultures are drawn this morning. -Start IV Zosyn (08/27/15) per pharmacy -NS @ 75 with KCL 40 mEq mL/hr -Zofran prn -f/u GI pathogen panel -f/u blood cultures (6/14) -Tylenol 650 mg q6h prn    AKI on CKD 3: SCr increased from baseline (1.5-1.8) at 3.27 on admission, likely secondary to GI illness and decreased oral intake. SCr downtrending overnight. -Continue gentle IVF as above in setting of CHF -BMET in  AM  Hypokalemia: K 3.2 this morning. Will supplement. -Increase KCL to 40 mEQ with NS -Add 40 mEq potassium oral solution -Repeat BMET in AM  Scleroderma with Pulmonary HTN:  -Continue home Selexipag 1600 mcg po BID -Continue home Ambrisentan 10 mg po daily -Continue home Riociguat 1 mg TID  Chronic Diastolic CHF: Last echo in Oct 2016 with EF 19-69%, grade 2 diastolic dysfunction. She follows with Dr. Aundra Dubin as an outpatient. She is on Metolazone 2.5 mg twice weekly and Torsemide 60 mg BID. -Hold diuretics in setting of volume depletion  Hypothyroidism: TSH 3.167. She takes Synthroid 25 mcg daily at home. -Continue home Synthroid   Paroxysmal AFib: On amiodarone 200 mg daily. Not on anticoagulation due to history of GI bleeding. In NSR currently. - Continue home Amiodarone 200 mg daily  Type 2 DM: Diet controlled. Last HbA1c 5.4 in 08/04/15 - Monitor CBGs on bmet    Dispo: Disposition is deferred at this time, awaiting improvement of current medical problems.  Anticipated discharge in approximately 2-3 day(s).   The patient does have a current PCP Sid Falcon, MD) and does need an [facility] hospital follow-up appointment after discharge.  The patient does have transportation limitations that hinder transportation to clinic appointments.    LOS: 1 day   Zada Finders, MD 08/27/2015, 11:59 AM

## 2015-08-28 DIAGNOSIS — M349 Systemic sclerosis, unspecified: Secondary | ICD-10-CM | POA: Diagnosis not present

## 2015-08-28 DIAGNOSIS — Z888 Allergy status to other drugs, medicaments and biological substances status: Secondary | ICD-10-CM | POA: Diagnosis not present

## 2015-08-28 DIAGNOSIS — K219 Gastro-esophageal reflux disease without esophagitis: Secondary | ICD-10-CM | POA: Diagnosis present

## 2015-08-28 DIAGNOSIS — E440 Moderate protein-calorie malnutrition: Secondary | ICD-10-CM | POA: Diagnosis present

## 2015-08-28 DIAGNOSIS — D573 Sickle-cell trait: Secondary | ICD-10-CM | POA: Diagnosis present

## 2015-08-28 DIAGNOSIS — K31819 Angiodysplasia of stomach and duodenum without bleeding: Secondary | ICD-10-CM | POA: Diagnosis present

## 2015-08-28 DIAGNOSIS — Z833 Family history of diabetes mellitus: Secondary | ICD-10-CM | POA: Diagnosis not present

## 2015-08-28 DIAGNOSIS — Z66 Do not resuscitate: Secondary | ICD-10-CM | POA: Diagnosis present

## 2015-08-28 DIAGNOSIS — Z96652 Presence of left artificial knee joint: Secondary | ICD-10-CM | POA: Diagnosis present

## 2015-08-28 DIAGNOSIS — I480 Paroxysmal atrial fibrillation: Secondary | ICD-10-CM | POA: Diagnosis present

## 2015-08-28 DIAGNOSIS — Z91041 Radiographic dye allergy status: Secondary | ICD-10-CM | POA: Diagnosis not present

## 2015-08-28 DIAGNOSIS — R1084 Generalized abdominal pain: Secondary | ICD-10-CM | POA: Diagnosis present

## 2015-08-28 DIAGNOSIS — E876 Hypokalemia: Secondary | ICD-10-CM | POA: Diagnosis present

## 2015-08-28 DIAGNOSIS — Z8249 Family history of ischemic heart disease and other diseases of the circulatory system: Secondary | ICD-10-CM | POA: Diagnosis not present

## 2015-08-28 DIAGNOSIS — D509 Iron deficiency anemia, unspecified: Secondary | ICD-10-CM | POA: Diagnosis present

## 2015-08-28 DIAGNOSIS — E1122 Type 2 diabetes mellitus with diabetic chronic kidney disease: Secondary | ICD-10-CM | POA: Diagnosis present

## 2015-08-28 DIAGNOSIS — N183 Chronic kidney disease, stage 3 (moderate): Secondary | ICD-10-CM | POA: Diagnosis present

## 2015-08-28 DIAGNOSIS — E039 Hypothyroidism, unspecified: Secondary | ICD-10-CM | POA: Diagnosis present

## 2015-08-28 DIAGNOSIS — K529 Noninfective gastroenteritis and colitis, unspecified: Secondary | ICD-10-CM | POA: Diagnosis not present

## 2015-08-28 DIAGNOSIS — A047 Enterocolitis due to Clostridium difficile: Secondary | ICD-10-CM | POA: Diagnosis not present

## 2015-08-28 DIAGNOSIS — I5032 Chronic diastolic (congestive) heart failure: Secondary | ICD-10-CM | POA: Diagnosis present

## 2015-08-28 DIAGNOSIS — Z885 Allergy status to narcotic agent status: Secondary | ICD-10-CM | POA: Diagnosis not present

## 2015-08-28 DIAGNOSIS — E1142 Type 2 diabetes mellitus with diabetic polyneuropathy: Secondary | ICD-10-CM | POA: Diagnosis present

## 2015-08-28 DIAGNOSIS — R4182 Altered mental status, unspecified: Secondary | ICD-10-CM | POA: Diagnosis present

## 2015-08-28 DIAGNOSIS — I272 Other secondary pulmonary hypertension: Secondary | ICD-10-CM | POA: Diagnosis not present

## 2015-08-28 DIAGNOSIS — E860 Dehydration: Secondary | ICD-10-CM | POA: Diagnosis present

## 2015-08-28 DIAGNOSIS — R5381 Other malaise: Secondary | ICD-10-CM | POA: Diagnosis not present

## 2015-08-28 DIAGNOSIS — R509 Fever, unspecified: Secondary | ICD-10-CM | POA: Diagnosis not present

## 2015-08-28 DIAGNOSIS — N179 Acute kidney failure, unspecified: Secondary | ICD-10-CM | POA: Diagnosis present

## 2015-08-28 DIAGNOSIS — A09 Infectious gastroenteritis and colitis, unspecified: Secondary | ICD-10-CM | POA: Diagnosis not present

## 2015-08-28 LAB — CBC
HEMATOCRIT: 27.3 % — AB (ref 36.0–46.0)
HEMOGLOBIN: 9 g/dL — AB (ref 12.0–15.0)
MCH: 23.9 pg — ABNORMAL LOW (ref 26.0–34.0)
MCHC: 33 g/dL (ref 30.0–36.0)
MCV: 72.6 fL — AB (ref 78.0–100.0)
Platelets: 221 10*3/uL (ref 150–400)
RBC: 3.76 MIL/uL — AB (ref 3.87–5.11)
RDW: 17 % — ABNORMAL HIGH (ref 11.5–15.5)
WBC: 10.5 10*3/uL (ref 4.0–10.5)

## 2015-08-28 LAB — URINALYSIS, ROUTINE W REFLEX MICROSCOPIC
BILIRUBIN URINE: NEGATIVE
GLUCOSE, UA: NEGATIVE mg/dL
HGB URINE DIPSTICK: NEGATIVE
Ketones, ur: NEGATIVE mg/dL
Leukocytes, UA: NEGATIVE
NITRITE: NEGATIVE
PH: 6 (ref 5.0–8.0)
Protein, ur: 30 mg/dL — AB
SPECIFIC GRAVITY, URINE: 1.015 (ref 1.005–1.030)

## 2015-08-28 LAB — URINE MICROSCOPIC-ADD ON
Bacteria, UA: NONE SEEN
RBC / HPF: NONE SEEN RBC/hpf (ref 0–5)
Squamous Epithelial / LPF: NONE SEEN

## 2015-08-28 LAB — GLUCOSE, CAPILLARY
GLUCOSE-CAPILLARY: 109 mg/dL — AB (ref 65–99)
GLUCOSE-CAPILLARY: 105 mg/dL — AB (ref 65–99)
GLUCOSE-CAPILLARY: 175 mg/dL — AB (ref 65–99)
Glucose-Capillary: 150 mg/dL — ABNORMAL HIGH (ref 65–99)

## 2015-08-28 LAB — BASIC METABOLIC PANEL
ANION GAP: 13 (ref 5–15)
BUN: 85 mg/dL — ABNORMAL HIGH (ref 6–20)
CHLORIDE: 103 mmol/L (ref 101–111)
CO2: 21 mmol/L — AB (ref 22–32)
Calcium: 9.1 mg/dL (ref 8.9–10.3)
Creatinine, Ser: 2.24 mg/dL — ABNORMAL HIGH (ref 0.44–1.00)
GFR calc non Af Amer: 22 mL/min — ABNORMAL LOW (ref >60)
GFR, EST AFRICAN AMERICAN: 26 mL/min — AB (ref >60)
GLUCOSE: 113 mg/dL — AB (ref 65–99)
POTASSIUM: 3.2 mmol/L — AB (ref 3.5–5.1)
Sodium: 137 mmol/L (ref 135–145)

## 2015-08-28 LAB — MAGNESIUM: Magnesium: 1.8 mg/dL (ref 1.7–2.4)

## 2015-08-28 MED ORDER — SODIUM CHLORIDE 0.9 % IV BOLUS (SEPSIS)
500.0000 mL | Freq: Once | INTRAVENOUS | Status: AC
  Administered 2015-08-28: 500 mL via INTRAVENOUS

## 2015-08-28 MED ORDER — POTASSIUM CHLORIDE IN NACL 40-0.9 MEQ/L-% IV SOLN
INTRAVENOUS | Status: DC
Start: 2015-08-28 — End: 2015-08-30
  Administered 2015-08-28: 75 mL/h via INTRAVENOUS
  Filled 2015-08-28: qty 1000

## 2015-08-28 MED ORDER — POTASSIUM CHLORIDE 20 MEQ/15ML (10%) PO SOLN
40.0000 meq | Freq: Once | ORAL | Status: AC
  Administered 2015-08-28: 40 meq via ORAL
  Filled 2015-08-28: qty 30

## 2015-08-28 MED ORDER — PIPERACILLIN-TAZOBACTAM 3.375 G IVPB
3.3750 g | Freq: Three times a day (TID) | INTRAVENOUS | Status: DC
  Administered 2015-08-28 – 2015-08-29 (×4): 3.375 g via INTRAVENOUS
  Filled 2015-08-28 (×5): qty 50

## 2015-08-28 NOTE — Care Management Obs Status (Signed)
[place] NOTIFICATION   Patient Details  Name: [name] MRN: [id number] Date of Birth: 1950-06-26   Medicare Observation Status Notification Given:  Yes  Attempted to deliver Obs notification, pt very lethargic, unable to discuss , left for family with note to call CM for questions.    Carleigh Buccieri, Rory Percy, RN 08/28/2015, 11:00 AM

## 2015-08-28 NOTE — Progress Notes (Signed)
Subjective: Patient was very sleepy when seen this morning, a little more awake during rounds. She denied any loose stools over night, however she is incontinent with loose stool in bed and has a rectal tube placed. She says she has not been out of bed. She denies any pain.  Objective: Vital signs in last 24 hours: Filed Vitals:   08/28/15 0528 08/28/15 0541 08/28/15 0659 08/28/15 0935  BP: 77/38 84/43 105/43 102/48  Pulse: 76     Temp: 99.3 F (37.4 C)   98.8 F (37.1 C)  TempSrc: Oral   Oral  Resp: 19   20  SpO2: 98%   98%   Weight change:   Intake/Output Summary (Last 24 hours) at 08/28/15 1539 Last data filed at 08/28/15 1000  Gross per 24 hour  Intake    390 ml  Output    650 ml  Net   -260 ml   General: resting in bed, lethargic Cardiac: RRR, systolic murmur heard best at the left sternal border Pulm: bibasilar rales heard Abd: soft, mild tenderness at RLQ, nondistended GU: rectal tube in place Ext: warm and well perfused, no pedal edema    Assessment/Plan: Active Problems:   DM type 2 with diabetic peripheral neuropathy (HCC)   Colitis   Lung disease with systemic sclerosis (HCC)   Other secondary pulmonary hypertension (HCC)   Thyroid activity decreased   Anemia, iron deficiency   AVM (arteriovenous malformation) of stomach, acquired   Malnutrition of moderate degree  65 year old lady with PMH of Systemic Sclerosis with secondary Pulmonary HTN, T2DM, CKD, CHF, PAF (not on A/C 2/2 GI bleeds), HTN, Iron Deficiency Anemia, and Sickle Cell Trait presenting with Colitis and AKI.   Fever with Proximal Colitis on imaging: Patient with decreased appetite, diarrhea, leukocytosis, and CT findings showing proximal colitis. This is likely infectious in nature, viral vs bacterial. Patient was afebrile overnight. Broad-spectrum antibiotics were empirically started as patient is high risk for decompensation. Blood cultures drawn on 6/14 are pending. She is incontinent,  rectal tube is in place. -IV Zosyn (08/27/15) per pharmacy -NS @ 75 with KCL 40 mEq mL/hr for 12 hours -Zofran prn -GI pathogen panel is negative -f/u blood cultures (6/14) -Tylenol 650 mg q6h prn  -Insert foley, collect urine for UA  AKI on CKD 3: SCr increased from baseline (1.5-1.8) at 3.27 on admission, likely secondary to GI illness and decreased oral intake. SCr downtrending overnight. -Continue gentle IVF as above in setting of CHF -BMET in AM  Hypokalemia: K 3.2 this morning. Will supplement. -KCL to 40 mEQ with NS -40 mEq potassium oral solution -Repeat BMET in AM  Scleroderma with Pulmonary HTN:  -Continue home Selexipag 1600 mcg po BID -Continue home Ambrisentan 10 mg po daily -Continue home Riociguat 1 mg TID  Chronic Diastolic CHF: Last echo in Oct 2016 with EF 03-12%, grade 2 diastolic dysfunction. She follows with Dr. Aundra Dubin as an outpatient. She is on Metolazone 2.5 mg twice weekly and Torsemide 60 mg BID. -Hold diuretics in setting of volume depletion  Hypothyroidism: TSH 3.167. She takes Synthroid 25 mcg daily at home. -Continue home Synthroid   Paroxysmal AFib: On amiodarone 200 mg daily. Not on anticoagulation due to history of GI bleeding. In NSR currently. - Continue home Amiodarone 200 mg daily  Type 2 DM: Diet controlled. Last HbA1c 5.4 in 08/04/15 - Monitor CBGs on bmet  Code: DNR, family reports they have been in discussion for hospice care  Dispo:  Disposition is deferred at this time, awaiting improvement of current medical problems.  Anticipated discharge in approximately 2-3 day(s). PT recommending SNF.   The patient does have a current PCP Sid Falcon, MD) and does need an [facility] hospital follow-up appointment after discharge.  The patient does have transportation limitations that hinder transportation to clinic appointments.    LOS: 2 days   Zada Finders, MD 08/28/2015, 3:39 PM

## 2015-08-28 NOTE — Progress Notes (Signed)
Pharmacy Antibiotic Note [name] is a 65 y.o. female admitted on 08/26/2015 with intra-abdominal infection. Currently on day 2 of Zosyn for proximal colitis. SCr/renal fxn improved  improved. 20 ml/min.  WBC remains elevated.   Plan: 1. With improvement in renal fxn will adjust Zosyn to 3.375 grams IV q8h (infused over 4 hours)  2. Monitor clinical progress, c/s, renal function, abx plan/LOT  Temp (24hrs), Avg:99 F (37.2 C), Min:98.5 F (36.9 C), Max:99.3 F (37.4 C)   Recent Labs Lab 08/22/15 1028 08/26/15 1533 08/27/15 0516 08/28/15 0718  WBC 3.9 11.9* 9.7 10.5  CREATININE  --  3.27* 2.75* 2.24*    Estimated Creatinine Clearance: 23.1 mL/min (by C-G formula based on Cr of 2.24).    Allergies  Allergen Reactions  . Cephalexin Rash  . Ciprofloxacin Rash  . Codeine Other (See Comments)     GI upset  . Contrast Media [Iodinated Diagnostic Agents] Hives  . Iohexol Hives     Code: HIVES, Desc: pt breaks out in large hives. needs full premeds, Onset Date: [id number]     Antimicrobials this admission: 6/14 zosyn >>   Dose adjustments this admission: n/a  Microbiology results: 6/14 BCx: ngtd 6/14 GI panel: negative   Vincenza Hews, PharmD, BCPS 08/28/2015, 8:32 AM Pager: [phone]

## 2015-08-28 NOTE — Progress Notes (Signed)
Patient ID: [name], female   DOB: [birth year], 65 y.o.   MRN: [id number] Medicine attending: I examined this patient today and I concur with the evaluation and management plan as recorded in the progress note by resident physician Dr. Zada Finders. She is much more alert and interactive today. Abdomen remains diffusely tender on exam. More so than yesterday's exam. Now right upper quadrant and right lower quadrant. She is incontinent of urine and stool. Fever is resolving. Blood pressure remains low. Renal function improving with hydration. Creatinine down from 3.3 to 1.8 GI pathogen panel negative. Impression: Acute colitis in a lady with underlying scleroderma not on chronic immunosuppressive agents Plan: Continue current management

## 2015-08-28 NOTE — Progress Notes (Signed)
Patient bp 77/38, manually 84/43. MD notified. Awaiting orders. RN will continue to monitor patient.  Ermalinda Memos, RN

## 2015-08-28 NOTE — Evaluation (Signed)
Physical Therapy Evaluation Patient Details Name: [name] MRN: [id number] DOB: 09-Apr-1950 Today's Date: 08/28/2015   History of Present Illness  Pt adm with fever and colitis. PMH - GI bleeding secondary to AVMs, pulmonary hypertension secondary to scleroderma, sickle cell trait, type 2 diabetes that is diet controlled, paroxysmal AFib, chronic diastolic CHF, and CKD.  Clinical Impression  Pt admitted with above diagnosis and presents to PT with functional limitations due to deficits listed below (See PT problem list). Pt needs skilled PT to maximize independence and safety to allow discharge to ST-SNF. Pt currently very limited with mobility.     Follow Up Recommendations SNF    Equipment Recommendations  None recommended by PT    Recommendations for Other Services OT consult     Precautions / Restrictions Precautions Precautions: Fall Restrictions Weight Bearing Restrictions: No      Mobility  Bed Mobility Overal bed mobility: Needs Assistance Bed Mobility: Supine to Sit;Sit to Supine     Supine to sit: +2 for physical assistance;Max assist Sit to supine: Max assist      Transfers Overall transfer level: Needs assistance Equipment used: Rolling walker (2 wheeled) Transfers: Sit to/from Stand Sit to Stand: +2 safety/equipment;Max assist         General transfer comment: Assist to bring hips and trunk up. Stood x 30 sec with +2 min A to maintain. Then flexiseal fell out and pt returned to bed.  Ambulation/Gait             General Gait Details: Unable to attempt due to flexiseal falling out  Stairs            Wheelchair Mobility    Modified Rankin (Stroke Patients Only)       Balance Overall balance assessment: Needs assistance Sitting-balance support: No upper extremity supported;Feet supported Sitting balance-Leahy Scale: Fair     Standing balance support: Bilateral upper extremity supported Standing balance-Leahy Scale:  Poor Standing balance comment: Walker and +65mn A for static standing                             Pertinent Vitals/Pain Pain Assessment: Faces Faces Pain Scale: Hurts even more Pain Location: all over Pain Descriptors / Indicators: Grimacing Pain Intervention(s): Limited activity within patient's tolerance;Monitored during session    Home Living Family/patient expects to be discharged to:: Private residence Living Arrangements: Other relatives (granddaughter) Available Help at Discharge: Family;Available PRN/intermittently Type of Home: Apartment Home Access: Level entry     Home Layout: One level Home Equipment: Walker - 2 wheels;Walker - 4 wheels;Cane - single point;Bedside commode      Prior Function Level of Independence: Needs assistance   Gait / Transfers Assistance Needed: Pt states she has been amb without assistive device           Hand Dominance   Dominant Hand: Right    Extremity/Trunk Assessment   Upper Extremity Assessment: Defer to OT evaluation           Lower Extremity Assessment: Generalized weakness;RLE deficits/detail;LLE deficits/detail RLE Deficits / Details: grossly 3-/5 LLE Deficits / Details: grossly 3-/5     Communication   Communication: No difficulties  Cognition Arousal/Alertness: Awake/alert Behavior During Therapy: Flat affect Overall Cognitive Status: Impaired/Different from baseline                 General Comments: Pt not fully engaged in conversation and activity    General Comments  Exercises        Assessment/Plan    PT Assessment Patient needs continued PT services  PT Diagnosis Difficulty walking;Generalized weakness   PT Problem List Decreased strength;Decreased activity tolerance;Decreased balance;Decreased mobility  PT Treatment Interventions DME instruction;Gait training;Functional mobility training;Therapeutic activities;Therapeutic exercise;Balance training;Patient/family  education   PT Goals (Current goals can be found in the Care Plan section) Acute Rehab PT Goals Patient Stated Goal: Not stated PT Goal Formulation: With patient Time For Goal Achievement: 09/11/15 Potential to Achieve Goals: Fair    Frequency Min 3X/week   Barriers to discharge Decreased caregiver support Doesn't have 24 hour assist    Co-evaluation               End of Session Equipment Utilized During Treatment: Gait belt Activity Tolerance: Patient limited by fatigue Patient left: in bed;with call bell/phone within reach Nurse Communication: Other (comment) (Loss of flexiseal)    Functional Assessment Tool Used: clinical judgement Functional Limitation: Mobility: Walking and moving around Mobility: Walking and Moving Around Current Status (T7409): At least 60 percent but less than 80 percent impaired, limited or restricted Mobility: Walking and Moving Around Goal Status [id number]): At least 20 percent but less than 40 percent impaired, limited or restricted    Time: 1013-1030 PT Time Calculation (min) (ACUTE ONLY): 17 min   Charges:   PT Evaluation $PT Eval Moderate Complexity: 1 Procedure     PT G Codes:   PT G-Codes **NOT FOR INPATIENT CLASS** Functional Assessment Tool Used: clinical judgement Functional Limitation: Mobility: Walking and moving around Mobility: Walking and Moving Around Current Status (Q4471): At least 60 percent but less than 80 percent impaired, limited or restricted Mobility: Walking and Moving Around Goal Status [phone]): At least 20 percent but less than 40 percent impaired, limited or restricted    [facility] 08/28/2015, 12:41 PM [facility] PT [phone]

## 2015-08-28 NOTE — Care Management Note (Signed)
Case Management Note  Patient Details  Name: [name] MRN: [id number] Date of Birth: 1950/06/23  Subjective/Objective:     CM following for progression and d/c planning.                Action/Plan: 08/28/2015 Spoke with PT re pt eval and informed of pt need for SNF, due to weakness and inability to ambulate. This CM spoke with pt who was unable or reluctant to respond to questions. Pt just stated over and over that she needed to go home.  Will continue to follow and plan for d/c needs.   Expected Discharge Date:                  Expected Discharge Plan:  Skilled Nursing Facility  In-House Referral:  Clinical Social Work  Discharge planning Services  CM Consult  Post Acute Care Choice:  NA Choice offered to:  NA  DME Arranged:    DME Agency:     HH Arranged:    [place] Agency:     Status of Service:  In process, will continue to follow  Medicare Important Message Given:    Date Medicare IM Given:    Medicare IM give by:    Date Additional Medicare IM Given:    Additional Medicare Important Message give by:     If discussed at [place] of Stay Meetings, dates discussed:    Additional Comments:  Adron Bene, RN 08/28/2015, 3:52 PM

## 2015-08-29 DIAGNOSIS — R5381 Other malaise: Secondary | ICD-10-CM

## 2015-08-29 DIAGNOSIS — A09 Infectious gastroenteritis and colitis, unspecified: Secondary | ICD-10-CM

## 2015-08-29 DIAGNOSIS — I5032 Chronic diastolic (congestive) heart failure: Secondary | ICD-10-CM

## 2015-08-29 LAB — BASIC METABOLIC PANEL
Anion gap: 14 (ref 5–15)
BUN: 70 mg/dL — AB (ref 6–20)
CALCIUM: 9.1 mg/dL (ref 8.9–10.3)
CHLORIDE: 108 mmol/L (ref 101–111)
CO2: 17 mmol/L — AB (ref 22–32)
CREATININE: 1.83 mg/dL — AB (ref 0.44–1.00)
GFR calc Af Amer: 33 mL/min — ABNORMAL LOW (ref >60)
GFR calc non Af Amer: 28 mL/min — ABNORMAL LOW (ref >60)
GLUCOSE: 104 mg/dL — AB (ref 65–99)
Potassium: 3.7 mmol/L (ref 3.5–5.1)
Sodium: 139 mmol/L (ref 135–145)

## 2015-08-29 LAB — GLUCOSE, CAPILLARY
GLUCOSE-CAPILLARY: 109 mg/dL — AB (ref 65–99)
GLUCOSE-CAPILLARY: 84 mg/dL (ref 65–99)
Glucose-Capillary: 115 mg/dL — ABNORMAL HIGH (ref 65–99)

## 2015-08-29 LAB — CBC
HEMATOCRIT: 29.4 % — AB (ref 36.0–46.0)
HEMOGLOBIN: 9.5 g/dL — AB (ref 12.0–15.0)
MCH: 24.2 pg — AB (ref 26.0–34.0)
MCHC: 32.3 g/dL (ref 30.0–36.0)
MCV: 74.8 fL — ABNORMAL LOW (ref 78.0–100.0)
Platelets: 209 10*3/uL (ref 150–400)
RBC: 3.93 MIL/uL (ref 3.87–5.11)
RDW: 17.5 % — AB (ref 11.5–15.5)
WBC: 11.3 10*3/uL — ABNORMAL HIGH (ref 4.0–10.5)

## 2015-08-29 MED ORDER — AMOXICILLIN-POT CLAVULANATE 500-125 MG PO TABS
1.0000 | ORAL_TABLET | Freq: Two times a day (BID) | ORAL | Status: DC
  Administered 2015-08-29 – 2015-08-30 (×2): 500 mg via ORAL
  Filled 2015-08-29 (×2): qty 1

## 2015-08-29 NOTE — Progress Notes (Signed)
Subjective: Patient much more alert and interactive this morning compared to the last few days. She denies any frequent or loose stools, however she seems uncertain about this. She denies any abdominal pain, fevers, or chills. She says she does not feel tired, but is tired of laying on her back.  Objective: Vital signs in last 24 hours: Filed Vitals:   08/28/15 1701 08/28/15 2027 08/29/15 0610 08/29/15 0900  BP: _0 101/44  Pulse: 75 72 75 73  Temp: 99.4 F (37.4 C) 98.9 F (37.2 C) 98.4 F (36.9 C) 98.6 F (37 C)  TempSrc: Oral Oral Oral Oral  Resp: _1 Weight:  151 lb 9.6 oz (68.765 kg)    SpO2: 97% 94% 94% 94%   Weight change:   Intake/Output Summary (Last 24 hours) at 08/29/15 1354 Last data filed at 08/29/15 0900  Gross per 24 hour  Intake    120 ml  Output   1600 ml  Net  -1480 ml   General: resting in bed, no acute distress Cardiac: RRR, systolic murmur heard best at the left sternal border Pulm: bibasilar rales Abd: soft, non-tender, nondistended, quiet bowel sounds GU: rectal tube and foley in place Ext: warm and well perfused, no pedal edema    Assessment/Plan: Active Problems:   DM type 2 with diabetic peripheral neuropathy (HCC)   Colitis   Lung disease with systemic sclerosis (HCC)   Other secondary pulmonary hypertension (HCC)   Thyroid activity decreased   Anemia, iron deficiency   AVM (arteriovenous malformation) of stomach, acquired   Malnutrition of moderate degree  65 year old lady with PMH of Systemic Sclerosis with secondary Pulmonary HTN, T2DM, CKD, CHF, PAF (not on A/C 2/2 GI bleeds), HTN, Iron Deficiency Anemia, and Sickle Cell Trait presenting with Colitis and AKI.   Fever with Proximal Colitis on imaging: Patient with decreased appetite, diarrhea, leukocytosis, and CT findings showing proximal colitis. This is likely infectious in nature, viral vs bacterial. Patient was afebrile overnight. Empiric Zosyn was  started on 6/14 as patient is high risk for decompensation. Blood cultures drawn on 6/14 are NGTD. Will transition to oral antibiotics today, of note patient has listed allergies of Ciprofloxacin and Cephalexin. Discussed with Infectious Disease, recommend Augmentin alone when transitioning to oral antibiotics as it has some anaerobic coverage. Patient has CKD 3 with CrCl of 30.6, so will start 500 mg dosing rather than 875 mg. Of note, C difficile not checked this admission, however as patient improving on current treatment and no recent antibiotics prior to admission, less likely to be C diff. -d/c IV Zosyn (6/14 >> 6/16) per pharmacy -Start oral Augmentin 500 mg q12h to complete 7 day total antibiotic course (last day 09/02/15). -Zofran prn -Blood cultures (6/14) >> -Tylenol 650 mg q6h prn  -d/c rectal tube   AKI on CKD 3: SCr increased from baseline (1.5-1.8) at 3.27 on admission, likely secondary to GI illness and decreased oral intake. SCr this morning is 1.83, at her baseline. BUN trending down. -Continue oral nutrition -BMET in AM -Monitor I/Os  Deconditioning: Patient with limited activity. PT has recommended SNF, however patient wants to return home on discharge. Family has mentioned working towards hospice. Will try to discuss with family to clarify whether hospice arrangements are already planned or deferred for now. If deferred, would recommend HHPT on discharge. -Called patient's sister, Jorene Guest, left voicemail for callback to update family  Hypokalemia: Repleted -Monitor BMET in AM  Scleroderma  with Pulmonary HTN:  -Continue home Selexipag 1600 mcg po BID -Continue home Ambrisentan 10 mg po daily -Continue home Riociguat 1 mg TID  Chronic Diastolic CHF: Last echo in Oct 2016 with EF 92-92%, grade 2 diastolic dysfunction. She follows with Dr. Aundra Dubin as an outpatient. She is on Metolazone 2.5 mg twice weekly and Torsemide 60 mg BID. -Hold diuretics in setting of volume  depletion  Hypothyroidism: TSH 3.167. She takes Synthroid 25 mcg daily at home. -Continue home Synthroid   Paroxysmal AFib: On amiodarone 200 mg daily. Not on anticoagulation due to history of GI bleeding. Rate controlled. - Continue home Amiodarone 200 mg daily  Type 2 DM: Diet controlled. Last HbA1c 5.4 in 08/04/15 - Monitor CBGs  Code: DNR, family reports they have been in discussion for hospice care  Dispo: Disposition is deferred at this time, awaiting improvement of current medical problems.  Anticipated discharge in approximately 1-2 day(s). PT recommending SNF, however patient wants to go home.   The patient does have a current PCP Sid Falcon, MD) and does need an [facility] hospital follow-up appointment after discharge.  The patient does have transportation limitations that hinder transportation to clinic appointments.    LOS: 3 days   Zada Finders, MD 08/29/2015, 1:54 PM

## 2015-08-29 NOTE — Progress Notes (Signed)
Patient seen and examined. Case d/w residents in detail. I agree with findings and plan as documented in Dr. Serita Grit note.  Patient with likely infectious colitis. She continues to improve on zosyn. AKI has now resolved with creatinine back to baseline. GI pathogen panel negative. C diff was not tested but given clinical improvement on pip tazo it is unlikely to be c diff. Will transition to PO augmentin to complete 7 day course of abx. Will f/u PT eval. Possible d/c home in AM with home PT if continues to improve. She refuses SNF placement at this time.

## 2015-08-29 NOTE — Progress Notes (Signed)
Pharmacy Antibiotic Note LAKEN ROG is a 65 y.o. female admitted on 08/26/2015 with intra-abdominal infection. Currently on day 3 of Zosyn for proximal colitis. SCr/renal fxn improved  Improved, CrCl > 20 ml/min.  WBC remains elevated, fever curve improved.   Plan: 1. Continue EI Zosyn at 3.375 grams IV q8h (infused over 4 hours)  2. Monitor clinical progress, c/s, renal function, abx plan/LOT  Temp (24hrs), Avg:98.9 F (37.2 C), Min:98.4 F (36.9 C), Max:99.4 F (37.4 C)   Recent Labs Lab 08/22/15 1028 08/26/15 1533 08/27/15 0516 08/28/15 0718  WBC 3.9 11.9* 9.7 10.5  CREATININE  --  3.27* 2.75* 2.24*    Estimated Creatinine Clearance: 25 mL/min (by C-G formula based on Cr of 2.24).    Allergies  Allergen Reactions  . Cephalexin Rash  . Ciprofloxacin Rash  . Codeine Other (See Comments)     GI upset  . Contrast Media [Iodinated Diagnostic Agents] Hives  . Iohexol Hives     Code: HIVES, Desc: pt breaks out in large hives. needs full premeds, Onset Date: [id number]     Antimicrobials this admission: 6/14 zosyn >>   Dose adjustments this admission: n/a  Microbiology results: 6/14 BCx: ngtd 6/14 GI panel: negative   Vincenza Hews, PharmD, BCPS 08/29/2015, 8:42 AM Pager: [phone]

## 2015-08-29 NOTE — Care Management Important Message (Signed)
Important Message  Patient Details  Name: [name] MRN: [id number] Date of Birth: 1951-01-09   Medicare Important Message Given:  Yes    Loann Quill 08/29/2015, 9:32 AM

## 2015-08-29 NOTE — Progress Notes (Signed)
   08/29/15 1500  Clinical Encounter Type  Visited With Patient and family together  Visit Type Spiritual support  Referral From Nurse  Spiritual Encounters  Spiritual Needs Prayer;Emotional  Stress Factors  Patient Stress Factors Exhausted;Health changes;Loss of control  Family Stress Factors Health changes  Chaplain visit completed, provided active listening, emotional support patient expressed feeling very fatigued and frustrated with situation,. Prayer offered. Advised that further services are available at request 24/7.

## 2015-08-30 ENCOUNTER — Telehealth: Payer: Self-pay | Admitting: Internal Medicine

## 2015-08-30 DIAGNOSIS — A047 Enterocolitis due to Clostridium difficile: Principal | ICD-10-CM

## 2015-08-30 LAB — C DIFFICILE QUICK SCREEN W PCR REFLEX
C DIFFICILE (CDIFF) INTERP: POSITIVE
C DIFFICILE (CDIFF) TOXIN: POSITIVE — AB
C DIFFICLE (CDIFF) ANTIGEN: POSITIVE — AB

## 2015-08-30 LAB — GLUCOSE, CAPILLARY
Glucose-Capillary: 106 mg/dL — ABNORMAL HIGH (ref 65–99)
Glucose-Capillary: 107 mg/dL — ABNORMAL HIGH (ref 65–99)

## 2015-08-30 LAB — BASIC METABOLIC PANEL
Anion gap: 12 (ref 5–15)
BUN: 62 mg/dL — AB (ref 6–20)
CALCIUM: 9 mg/dL (ref 8.9–10.3)
CO2: 19 mmol/L — ABNORMAL LOW (ref 22–32)
CREATININE: 1.64 mg/dL — AB (ref 0.44–1.00)
Chloride: 106 mmol/L (ref 101–111)
GFR calc Af Amer: 37 mL/min — ABNORMAL LOW (ref >60)
GFR, EST NON AFRICAN AMERICAN: 32 mL/min — AB (ref >60)
Glucose, Bld: 101 mg/dL — ABNORMAL HIGH (ref 65–99)
POTASSIUM: 3.4 mmol/L — AB (ref 3.5–5.1)
SODIUM: 137 mmol/L (ref 135–145)

## 2015-08-30 LAB — CBC
HCT: 27.4 % — ABNORMAL LOW (ref 36.0–46.0)
Hemoglobin: 8.8 g/dL — ABNORMAL LOW (ref 12.0–15.0)
MCH: 23.4 pg — AB (ref 26.0–34.0)
MCHC: 32.1 g/dL (ref 30.0–36.0)
MCV: 72.9 fL — ABNORMAL LOW (ref 78.0–100.0)
PLATELETS: 220 10*3/uL (ref 150–400)
RBC: 3.76 MIL/uL — AB (ref 3.87–5.11)
RDW: 17.4 % — ABNORMAL HIGH (ref 11.5–15.5)
WBC: 10.1 10*3/uL (ref 4.0–10.5)

## 2015-08-30 MED ORDER — TORSEMIDE 20 MG PO TABS: 60.0000 mg | ORAL_TABLET | Freq: Every day | ORAL | Status: DC

## 2015-08-30 MED ORDER — AMOXICILLIN-POT CLAVULANATE 500-125 MG PO TABS: 1.0000 | ORAL_TABLET | Freq: Two times a day (BID) | ORAL | Status: AC

## 2015-08-30 MED ORDER — METRONIDAZOLE 500 MG PO TABS: 500.0000 mg | ORAL_TABLET | Freq: Three times a day (TID) | ORAL | Status: AC

## 2015-08-30 MED ORDER — BOOST / RESOURCE BREEZE PO LIQD: 1.0000 | Freq: Three times a day (TID) | ORAL | Status: DC

## 2015-08-30 MED ORDER — POTASSIUM CHLORIDE 20 MEQ/15ML (10%) PO SOLN
40.0000 meq | Freq: Once | ORAL | Status: AC
  Administered 2015-08-30: 40 meq via ORAL
  Filled 2015-08-30: qty 30

## 2015-08-30 NOTE — Progress Notes (Signed)
Patient seen and examined. Case d/w residents in detail. I agree with findings and plan as documented in Dr. Serita Grit note.  Patient denies any abd pain. She is stable for d/c home today with home PT. She will need to complete course of PO augmentin for likely infectious colitis. AKI has now resolved with creatinine at baseline now. Will need close follow up at [facility].

## 2015-08-30 NOTE — Discharge Instructions (Signed)
[name],  You had an infection of your colon called colitis. We are treating you with antibiotics to complete a 7 day total course. Please complete the Augmentin as prescribed.   We will decrease your Torsemide to once a day and hold your Metolazone to prevent further dehydration. Please follow up with Cardiology and Dr. Daryll Drown. If you feel you are gaining fluid or have worsened shortness of breath before these appointments, please call the Internal Medicine Clinic to be seen sooner.  I have ordered home PT to work with you at home to help you gain some strength back.

## 2015-08-30 NOTE — Progress Notes (Signed)
Patient discharged home per MD. Stool sent for CDiff prior to discharge. Family educated by MD about CDiff collection,and MD to call if positive result. NSL discontinued with catheter intact. Medications retrieved from pharmacy. Discharge instructions reviewed with family per patient. Patient escorted out via wheelchair and NT escort. Bartholomew Crews, RN

## 2015-08-30 NOTE — Telephone Encounter (Signed)
After discharge, her C dif resulted positive.   We called her pharmacy to ensure whether or not she had picked up the antibiotic. As she had not, we instructed her pharmacy to not distribute the Augmentin and to instead give Flagyl.

## 2015-08-30 NOTE — Progress Notes (Addendum)
Subjective: Patient awake and interactive this morning, she is smiling and pleasant. She denies any pain. She is unsure if she is having continued loose bowel movements. She says she was able to get out of bed for some time yesterday and did fine. She says the hardest part is the physical act of getting out of bed. She is eager to go home and agrees to home health PT.   Objective: Vital signs in last 24 hours: Filed Vitals:   08/29/15 1636 08/29/15 2103 08/30/15 0529 08/30/15 0938  BP: 116/43 108/44 105/46 101/43  Pulse: 63 80 77 76  Temp: 99.6 F (37.6 C) 99.6 F (37.6 C) 98.5 F (36.9 C) 98.9 F (37.2 C)  TempSrc: Oral Oral Oral Oral  Resp: _0 Weight:  151 lb (68.493 kg)    SpO2: 95% 95% 95% 96%   Weight change: -9.6 oz (-0.272 kg)  Intake/Output Summary (Last 24 hours) at 08/30/15 1101 Last data filed at 08/30/15 0900  Gross per 24 hour  Intake    300 ml  Output   1550 ml  Net  -1250 ml   General: resting in bed, no acute distress Cardiac: RRR, systolic murmur heard best at the left sternal border Pulm: bibasilar rales Abd: soft, non-tender, quiet bowel sounds GU: foley in place Ext: warm and well perfused, no pedal edema    Assessment/Plan: Active Problems:   DM type 2 with diabetic peripheral neuropathy (HCC)   Colitis   Lung disease with systemic sclerosis (HCC)   Other secondary pulmonary hypertension (HCC)   Thyroid activity decreased   Anemia, iron deficiency   AVM (arteriovenous malformation) of stomach, acquired   Malnutrition of moderate degree  65 year old lady with PMH of Systemic Sclerosis with secondary Pulmonary HTN, T2DM, CKD, CHF, PAF (not on A/C 2/2 GI bleeds), HTN, Iron Deficiency Anemia, and Sickle Cell Trait presenting with Colitis and AKI.   Fever with Proximal Colitis on imaging: Patient with decreased appetite, diarrhea, leukocytosis, and CT findings showing proximal colitis. This is likely infectious in nature, viral vs  bacterial. Patient was afebrile overnight. Empiric Zosyn was started on 6/14 as patient is high risk for decompensation.Transitioned to oral antibiotics now, of note patient has listed allergies of Ciprofloxacin and Cephalexin. Discussed with Infectious Disease, recommend Augmentin alone when transitioning to oral antibiotics as it has some anaerobic coverage. Patient has CKD 3 with CrCl of 30.6, so will start 500 mg dosing rather than 875 mg. Of note, C difficile not checked this admission, however as patient improving on current treatment and no recent antibiotics prior to admission, less likely to be C diff. She is afebrile and without leukocytosis. -off IV Zosyn (6/14 >> 6/16) -Continue oral Augmentin 500 mg q12h to complete 7 day total antibiotic course (last day 09/02/15). -Zofran prn -Blood cultures (6/14) >> NGTD -Tylenol 650 mg q6h prn    AKI on CKD 3: SCr increased from baseline (1.5-1.8) at 3.27 on admission, likely secondary to GI illness and decreased oral intake. SCr this morning is 1.64. BUN trending down. -Continue oral nutrition -Monitor I/Os -d/c foley  Deconditioning: Patient with limited activity. PT has recommended SNF, however patient wants to return home on discharge. Family has mentioned working towards hospice. Spoke to patient's sister, Jorene Guest, to update hospital course and plan for discharge to home today. -HHPT, f/u with Cardiology (09/08/15) and Dr. Daryll Drown (09/10/15)  Hypokalemia: 3.4 this morning, will supplement. -K 40 mEq solution once -Monitor BMET  in AM  Scleroderma with Pulmonary HTN:  -Continue home Selexipag 1600 mcg po BID -Continue home Ambrisentan 10 mg po daily -Continue home Riociguat 1 mg TID  Chronic Diastolic CHF: Last echo in Oct 2016 with EF 04-15%, grade 2 diastolic dysfunction. She follows with Dr. Aundra Dubin as an outpatient. She is on Metolazone 2.5 mg twice weekly and Torsemide 60 mg BID. -Will discharge with lower dose Torsemide, hold  Metolazone on discharge -F/u in clinic within 1 week for dosing adjustment  Hypothyroidism: TSH 3.167. She takes Synthroid 25 mcg daily at home. -Continue home Synthroid   Paroxysmal AFib: On amiodarone 200 mg daily. Not on anticoagulation due to history of GI bleeding. Rate controlled. - Continue home Amiodarone 200 mg daily  Type 2 DM: Diet controlled. Last HbA1c 5.4 in 08/04/15 - Monitor CBGs  Code: DNR, family reports they have been in discussion for hospice care  Dispo:  Anticipated discharge to home today. PT recommending SNF, however patient wants to go home.   The patient does have a current PCP Sid Falcon, MD) and does need an [facility] hospital follow-up appointment after discharge.  The patient does have transportation limitations that hinder transportation to clinic appointments.    LOS: 4 days   Zada Finders, MD 08/30/2015, 11:01 AM

## 2015-08-30 NOTE — Progress Notes (Signed)
CRITICAL VALUE ALERT  Critical value received:  C. Diff positive Date of notification: 08/30/15  Time of notification:  4268 Critical value read back: yes  Nurse who received alert:  Manya Silvas, RN  MD notified (1st page):  Dr. Sherrye Payor  Time of first page: 90 MD notified (2nd page): n/a  Time of second page: n/a  Responding MD:  Dr. Sherrye Payor Time MD responded:  1551  MD informed of critical value. MD to f/u with patient and family. Bartholomew Crews, RN

## 2015-08-30 NOTE — Discharge Summary (Signed)
Name: [name] MRN: [id number] DOB: 07/12/1950 65 y.o. PCP: Sid Falcon, MD  Date of Admission: 08/26/2015  7:46 PM Date of Discharge: 08/30/2015 Attending Physician: Audry Pili, MD  Discharge Diagnosis: 1. Colitis 2. Acute on Chronic Kidney Injury Active Problems:   DM type 2 with diabetic peripheral neuropathy (HCC)   Colitis   Lung disease with systemic sclerosis (HCC)   Other secondary pulmonary hypertension (HCC)   Thyroid activity decreased   Anemia, iron deficiency   AVM (arteriovenous malformation) of stomach, acquired   Malnutrition of moderate degree  Discharge Medications:   Medication List    STOP taking these medications        metolazone 2.5 MG tablet  Commonly known as:  ZAROXOLYN      TAKE these medications        ambrisentan 10 MG tablet  Commonly known as:  LETAIRIS  Take 1 tablet (10 mg total) by mouth daily.     amiodarone 200 MG tablet  Commonly known as:  PACERONE  Take 1 tablet (200 mg total) by mouth daily.     amoxicillin-clavulanate 500-125 MG tablet  Commonly known as:  AUGMENTIN  Take 1 tablet (500 mg total) by mouth every 12 (twelve) hours.     esomeprazole 40 MG capsule  Commonly known as:  NEXIUM  Take 40 mg by mouth daily at 12 noon.     feeding supplement Liqd  Take 1 Container by mouth 3 (three) times daily between meals.     fenofibrate 54 MG tablet  Take 1 tablet (54 mg total) by mouth daily.     FLUoxetine 20 MG capsule  Commonly known as:  PROZAC  Take 1 capsule (20 mg total) by mouth every morning.     HYDROcodone-acetaminophen 10-325 MG tablet  Commonly known as:  NORCO  Take 1 tablet by mouth every 6 (six) hours as needed. pain     hydrOXYzine 10 MG tablet  Commonly known as:  ATARAX/VISTARIL  Take 1 tablet (10 mg total) by mouth 2 (two) times daily as needed for itching.     levothyroxine 25 MCG tablet  Commonly known as:  SYNTHROID, LEVOTHROID  Take 1 tablet (25 mcg total) by mouth daily  before breakfast.     nortriptyline 10 MG capsule  Commonly known as:  PAMELOR  Take 2 capsules (20 mg total) by mouth at bedtime.     potassium chloride SA 20 MEQ tablet  Commonly known as:  K-DUR,KLOR-CON  Take 40 mEq by mouth daily.     pregabalin 150 MG capsule  Commonly known as:  LYRICA  Take 1 capsule (150 mg total) by mouth 3 (three) times daily.     Selexipag 1600 MCG Tabs  Commonly known as:  UPTRAVI  Take 1,600 mcg by mouth 2 (two) times daily.     torsemide 20 MG tablet  Commonly known as:  DEMADEX  Take 3 tablets (60 mg total) by mouth daily.        Disposition and follow-up:   [name] was discharged from [facility] in Stable condition.  At the hospital follow up visit please address:  1.   C diff Colitis: Symptomatic improvement in fevers, diarrhea, lethargy. Completion of Metronidazole 500 mg TID 10 day total course (anticipated last day 09/08/15).  2.  Labs / imaging needed at time of follow-up: BMP  3.  Pending labs/ test needing follow-up: none  Follow-up Appointments: Follow-up Information  Follow up with Gilles Chiquito, MD On 09/10/2015.   Specialty:  Internal Medicine   Why:  At 11:15 AM   Contact information:   [place]  [phone]       Discharge Instructions:   Consultations:    Procedures Performed:  Ct Abdomen Pelvis Wo Contrast  08/26/2015  CLINICAL DATA:  Altered mental status. Generalized abdominal pain. Diarrhea. EXAM: CT ABDOMEN AND PELVIS WITHOUT CONTRAST TECHNIQUE: Multidetector CT imaging of the abdomen and pelvis was performed following the standard protocol without IV contrast. COMPARISON:  05/12/2015 FINDINGS: Lower chest and abdominal wall:  Chronic cardiomegaly. Hepatobiliary: Scattered hepatic cysts. No acute finding.No evidence of biliary obstruction or stone. Pancreas: Generalized atrophy without acute finding. Spleen: Unremarkable. Adrenals/Urinary Tract: Negative  adrenals. No hydronephrosis or stone. Unremarkable bladder. Reproductive:Hysterectomy.  No adnexal mass. Stomach/Bowel: Proximal predominant colonic mural thickening with mesocolonic edema. These inflammatory changes are not convincingly seen beyond the mid transverse segment. The appendix is difficult to visualize. No pericecal inflammation noted. Vascular/Lymphatic: Atherosclerosis without acute finding. No mass or adenopathy. Peritoneal: No ascites or pneumoperitoneum. Musculoskeletal: No acute abnormalities. Chronic bilateral sacroiliitis. Chronic lower disc bulging and lower lumbar facet arthropathy IMPRESSION: Proximal colitis, likely infectious. Chronic findings are stable from prior and described above. Electronically Signed   By: Monte Fantasia M.D.   On: 08/26/2015 17:51   Dg Chest 2 View  08/26/2015  CLINICAL DATA:  Altered mental status, mid abdomen pain and weakness, crackles at the lung bases EXAM: CHEST  2 VIEW COMPARISON:  Portable chest x-ray of 02/20/2015 and two-view chest x-ray of 12/24/2014 FINDINGS: There is moderate cardiomegaly present with pulmonary vascular congestion most consistent with mild congestive heart failure. Very small pleural effusions cannot be excluded. No focal infiltrate is seen. No bony abnormality is noted. IMPRESSION: Suspect mild CHF with moderate cardiomegaly and pulmonary vascular congestion. Electronically Signed   By: Ivar Drape M.D.   On: 08/26/2015 16:54    2D Echo: n/a  Cardiac Cath: n/a  Admission HPI:  [name] is a 65 year old lady with PMH of Systemic Sclerosis with secondary Pulmonary HTN, T2DM, CKD, CHF, PAF (not on A/C 2/2 GI bleeds), HTN, Iron Deficiency Anemia, and Sickle Cell Trait who presented to [facility] with lethargy and decreased appetite.  Over the past weekend, she started feeling tired and had a decreased appetite. She only had a banana and soda yesterday which is different from her usual state of health. She has also felt  nauseous without emesis during this interval with some loose, non-bloody bowel movements. She takes Norco at home for pain and reports taking it as prescribed, at least three times a day. She does acknowledge taking diuretics: 3 pills on Monday and 3 pills in the afternoon.   She denies abdominal pain, difficulty breathing, coughing, chest pain, headaches, urinary frequency, dysuria, leg swelling. No assistance with ADLs at her baseline. She lives with her granddaughter who is present and confirms patient's changes from baseline. Family states that she did have some baked chicken a couple days ago at [employer], which the family also ate. No sick contacts.  In clinic, she was noted to be hypotensive at 92/40 and blood work revealed an elevated SCr of 3.27 from her baseline of 1.5-1.8. CBC showed a white count of 11.9 and Hgb 8.7 (at baseline). CXR was read as cardiomegaly with pulmonary vascular congestion. A CT abdomen was obtained which was notable for proximal colitis.   Hospital Course by  problem list: Active Problems:   DM type 2 with diabetic peripheral neuropathy (HCC)   Colitis   Lung disease with systemic sclerosis ([place])   Other secondary pulmonary hypertension (HCC)   Thyroid activity decreased   Anemia, iron deficiency   AVM (arteriovenous malformation) of stomach, acquired   Malnutrition of moderate degree   Fever with Proximal Colitis on imaging: Patient with decreased appetite, diarrhea, leukocytosis, and CT findings showing proximal colitis. Tmax was 102F on day of admission. Likely infectious in nature, viral vs bacterial. Empiric Zosyn was started on 6/14 as patient was high risk for decompensation. She was afebrile afterwards and transitioned to oral antibiotics. Patient has listed allergies of Ciprofloxacin and Cephalexin. Discussed with Infectious Disease, recommend Augmentin alone for oral antibiotics as it has some anaerobic coverage. Patient has CKD 3 with CrCl of 30.6, so  started 500 mg dosing rather than 875 mg. C difficile resulted positive on day of discharge. She was afebrile and without leukocytosis on discharge with negative blood cultures to date. She was prescribed oral Metronidazole 500 mg TID to complete a 10 day course. Pharmacy was contacted to d/c Augmentin. Family was notified of change in antibiotic therapy.  AKI on CKD 3: SCr increased from baseline (1.5-1.8) at 3.27 on admission, likely secondary to GI illness and decreased oral intake. Improved with gentle IV fluids. SCr on discharge was 1.64.    Chronic Diastolic CHF: Last echo in Oct 2016 with EF 83-15%, grade 2 diastolic dysfunction. She follows with Dr. Aundra Dubin as an outpatient. Home Metolazone 2.5 mg twice weekly and Torsemide 60 mg BID were held during admission in setting of AKI. She was discharged on Torsemide 60 mg once daily and Metolazone was held on discharge.    Scleroderma with Pulmonary HTN: Continued home Selexipag 1600 mcg po BID, Ambrisentan 10 mg po daily, Riociguat 1 mg TID.   Discharge Vitals:   BP 101/43 mmHg  Pulse 76  Temp(Src) 98.9 F (37.2 C) (Oral)  Resp 16  Wt 151 lb (68.493 kg)  SpO2 96%  Discharge Labs:  Results for orders placed or performed during the hospital encounter of 08/26/15 (from the past 24 hour(s))  Glucose, capillary     Status: Abnormal   Collection Time: 08/29/15 12:06 PM  Result Value Ref Range   Glucose-Capillary 109 (H) 65 - 99 mg/dL  Glucose, capillary     Status: None   Collection Time: 08/29/15  4:32 PM  Result Value Ref Range   Glucose-Capillary 84 65 - 99 mg/dL  Glucose, capillary     Status: Abnormal   Collection Time: 08/29/15  9:08 PM  Result Value Ref Range   Glucose-Capillary 115 (H) 65 - 99 mg/dL  CBC     Status: Abnormal   Collection Time: 08/30/15  5:40 AM  Result Value Ref Range   WBC 10.1 4.0 - 10.5 K/uL   RBC 3.76 (L) 3.87 - 5.11 MIL/uL   Hemoglobin 8.8 (L) 12.0 - 15.0 g/dL   HCT 27.4 (L) 36.0 - 46.0 %   MCV 72.9  (L) 78.0 - 100.0 fL   MCH 23.4 (L) 26.0 - 34.0 pg   MCHC 32.1 30.0 - 36.0 g/dL   RDW 17.4 (H) 11.5 - 15.5 %   Platelets 220 150 - 400 K/uL  Basic metabolic panel     Status: Abnormal   Collection Time: 08/30/15  5:40 AM  Result Value Ref Range   Sodium 137 135 - 145 mmol/L   Potassium 3.4 (L) 3.5 -  5.1 mmol/L   Chloride 106 101 - 111 mmol/L   CO2 19 (L) 22 - 32 mmol/L   Glucose, Bld 101 (H) 65 - 99 mg/dL   BUN 62 (H) 6 - 20 mg/dL   Creatinine, Ser 1.64 (H) 0.44 - 1.00 mg/dL   Calcium 9.0 8.9 - 10.3 mg/dL   GFR calc non Af Amer 32 (L) >60 mL/min   GFR calc Af Amer 37 (L) >60 mL/min   Anion gap 12 5 - 15  Glucose, capillary     Status: Abnormal   Collection Time: 08/30/15  7:58 AM  Result Value Ref Range   Glucose-Capillary 106 (H) 65 - 99 mg/dL   *Note: Due to a large number of results and/or encounters for the requested time period, some results have not been displayed. A complete set of results can be found in Results Review.    Signed: Zada Finders, MD 08/30/2015, 10:31 AM    Services Ordered on Discharge: [place] Ordered on Discharge: none

## 2015-09-01 ENCOUNTER — Encounter (HOSPITAL_COMMUNITY): Payer: Self-pay | Admitting: Certified Registered Nurse Anesthetist

## 2015-09-01 ENCOUNTER — Telehealth: Payer: Self-pay | Admitting: Cardiology

## 2015-09-01 LAB — CULTURE, BLOOD (ROUTINE X 2)
CULTURE: NO GROWTH
CULTURE: NO GROWTH

## 2015-09-01 NOTE — Telephone Encounter (Signed)
New message       Pt c/o BP issue: STAT if pt c/o blurred vision, one-sided weakness or slurred speech  1. What are your last 5 BP readings? 83/43 lying today  2. Are you having any other symptoms (ex. Dizziness, headache, blurred vision, passed out)? no 3. What is your BP issue? The pt is just feeling tied    Pt c/o medication issue:  1. Name of Medication: adempas  2. How are you currently taking this medication (dosage and times per day)? 2 mg po   3. Are you having a reaction (difficulty breathing--STAT)? no  4. What is your medication issue? The question if the medication could make the blood pressure the pt continue to take through the hospital stay last week

## 2015-09-02 SURGERY — ESOPHAGOGASTRODUODENOSCOPY (EGD) WITH PROPOFOL
Anesthesia: Monitor Anesthesia Care

## 2015-09-02 MED ORDER — ONDANSETRON HCL 4 MG/2ML IJ SOLN
INTRAMUSCULAR | Status: AC
  Filled 2015-09-02: qty 2

## 2015-09-02 MED ORDER — PROPOFOL 10 MG/ML IV BOLUS
INTRAVENOUS | Status: AC
  Filled 2015-09-02: qty 60

## 2015-09-02 MED ORDER — LIDOCAINE HCL (CARDIAC) 20 MG/ML IV SOLN
INTRAVENOUS | Status: AC
  Filled 2015-09-02: qty 5

## 2015-09-02 NOTE — Telephone Encounter (Signed)
I think ok to increase, BP tends to fluctuate and always runs a bit low.

## 2015-09-02 NOTE — Anesthesia Preprocedure Evaluation (Deleted)
Anesthesia Evaluation    Airway        Dental   Pulmonary           Cardiovascular hypertension, + dysrhythmias Atrial Fibrillation   Echo 12/23/14 withEF 60-65%, moderately dilated RV, severe TR, PASP 72, no pericardial effusion.   Neuro/Psych    GI/Hepatic GERD  Medicated,Multiple GI bleeds   Endo/Other  diabetesHypothyroidism scleroderma  Renal/GU Renal InsufficiencyRenal disease (creat 1.64)     Musculoskeletal   Abdominal   Peds  Hematology  (+) Blood dyscrasia (Hb 8.8), Sickle cell trait ,   Anesthesia Other Findings   Reproductive/Obstetrics                            Anesthesia Physical Anesthesia Plan  ASA: IV  Anesthesia Plan: MAC   Post-op Pain Management:    Induction: Intravenous  Airway Management Planned: Natural Airway and Nasal Cannula  Additional Equipment:   Intra-op Plan:   Post-operative Plan:   Informed Consent:   Plan Discussed with:   Anesthesia Plan Comments:        Anesthesia Quick Evaluation

## 2015-09-02 NOTE — Telephone Encounter (Signed)
Spoke w/Millie, RN w/[facility] she states at visit w/pt yesterday her BP was in the 80's while pt was laying down, pt was asymptomatic, pt is still a little weak from hospitalization but overall was feeling ok.  She states pt's BP was 117/42 before she left pt's home.  She wants to make sure its ok w/Dr Aundra Dubin to increase Adempas to 2.5 mg TID, will send to him to review and call her back tomorrow.

## 2015-09-03 ENCOUNTER — Ambulatory Visit (HOSPITAL_COMMUNITY): Admission: RE | Admit: 2015-09-03 | Payer: Medicare Other | Source: Ambulatory Visit | Admitting: Gastroenterology

## 2015-09-03 HISTORY — DX: Cardiac arrhythmia, unspecified: I49.9

## 2015-09-03 HISTORY — DX: Personal history of other medical treatment: Z92.89

## 2015-09-04 NOTE — Telephone Encounter (Signed)
Millie aware and will increase dose

## 2015-09-08 ENCOUNTER — Encounter (HOSPITAL_COMMUNITY): Payer: Medicare Other

## 2015-09-10 ENCOUNTER — Encounter: Payer: Medicare Other | Admitting: Internal Medicine

## 2015-09-10 ENCOUNTER — Encounter: Payer: Self-pay | Admitting: Internal Medicine

## 2015-09-10 NOTE — Addendum Note (Signed)
Addended by: Hulan Fray on: 09/10/2015 01:21 PM   Modules accepted: Orders

## 2015-09-12 ENCOUNTER — Telehealth: Payer: Self-pay | Admitting: *Deleted

## 2015-09-12 NOTE — Telephone Encounter (Signed)
[facility] calls and ask for VO to continue services, VO given, will also remind pt that she needs to make and keep appt, will call pt today and see if i can schedule an appt

## 2015-09-17 ENCOUNTER — Ambulatory Visit: Payer: Medicare Other | Admitting: Nurse Practitioner

## 2015-09-17 ENCOUNTER — Ambulatory Visit (INDEPENDENT_AMBULATORY_CARE_PROVIDER_SITE_OTHER): Payer: Medicare Other | Admitting: Internal Medicine

## 2015-09-17 ENCOUNTER — Encounter: Payer: Self-pay | Admitting: Internal Medicine

## 2015-09-17 VITALS — BP 90/45 | HR 64 | Temp 98.3°F | Ht 65.0 in | Wt 157.6 lb

## 2015-09-17 DIAGNOSIS — E11649 Type 2 diabetes mellitus with hypoglycemia without coma: Secondary | ICD-10-CM

## 2015-09-17 DIAGNOSIS — L89309 Pressure ulcer of unspecified buttock, unspecified stage: Secondary | ICD-10-CM

## 2015-09-17 DIAGNOSIS — R55 Syncope and collapse: Secondary | ICD-10-CM | POA: Diagnosis not present

## 2015-09-17 DIAGNOSIS — E1142 Type 2 diabetes mellitus with diabetic polyneuropathy: Secondary | ICD-10-CM

## 2015-09-17 DIAGNOSIS — L8990 Pressure ulcer of unspecified site, unspecified stage: Secondary | ICD-10-CM

## 2015-09-17 DIAGNOSIS — I5032 Chronic diastolic (congestive) heart failure: Secondary | ICD-10-CM | POA: Diagnosis not present

## 2015-09-17 DIAGNOSIS — I5033 Acute on chronic diastolic (congestive) heart failure: Secondary | ICD-10-CM

## 2015-09-17 LAB — BASIC METABOLIC PANEL
ANION GAP: 8 (ref 5–15)
BUN: 37 mg/dL — ABNORMAL HIGH (ref 6–20)
CO2: 25 mmol/L (ref 22–32)
Calcium: 8.2 mg/dL — ABNORMAL LOW (ref 8.9–10.3)
Chloride: 105 mmol/L (ref 101–111)
Creatinine, Ser: 1.68 mg/dL — ABNORMAL HIGH (ref 0.44–1.00)
GFR calc Af Amer: 36 mL/min — ABNORMAL LOW (ref >60)
GFR calc non Af Amer: 31 mL/min — ABNORMAL LOW (ref >60)
GLUCOSE: 81 mg/dL (ref 65–99)
POTASSIUM: 4 mmol/L (ref 3.5–5.1)
Sodium: 138 mmol/L (ref 135–145)

## 2015-09-17 LAB — CBC WITH DIFFERENTIAL/PLATELET
BASOS PCT: 0 %
Basophils Absolute: 0 10*3/uL (ref 0.0–0.1)
EOS PCT: 1 %
Eosinophils Absolute: 0.1 10*3/uL (ref 0.0–0.7)
HEMATOCRIT: 24.6 % — AB (ref 36.0–46.0)
HEMOGLOBIN: 8 g/dL — AB (ref 12.0–15.0)
LYMPHS PCT: 21 %
Lymphs Abs: 1.1 10*3/uL (ref 0.7–4.0)
MCH: 25.7 pg — AB (ref 26.0–34.0)
MCHC: 32.5 g/dL (ref 30.0–36.0)
MCV: 79.1 fL (ref 78.0–100.0)
MONOS PCT: 9 %
Monocytes Absolute: 0.5 10*3/uL (ref 0.1–1.0)
NEUTROS ABS: 3.6 10*3/uL (ref 1.7–7.7)
Neutrophils Relative %: 69 %
Platelets: 165 10*3/uL (ref 150–400)
RBC: 3.11 MIL/uL — ABNORMAL LOW (ref 3.87–5.11)
RDW: 22.9 % — ABNORMAL HIGH (ref 11.5–15.5)
WBC: 5.3 10*3/uL (ref 4.0–10.5)

## 2015-09-17 LAB — GLUCOSE, CAPILLARY
Glucose-Capillary: 56 mg/dL — ABNORMAL LOW (ref 65–99)
Glucose-Capillary: 47 mg/dL — ABNORMAL LOW (ref 65–99)

## 2015-09-17 MED ORDER — HYDROXYZINE HCL 10 MG PO TABS: 10.0000 mg | ORAL_TABLET | Freq: Two times a day (BID) | ORAL | Status: DC | PRN

## 2015-09-17 NOTE — Patient Instructions (Addendum)
Ms. Lubinski - -   You have gained weight, from your heart failure.  Your bloodwork shows that your kidney function is stable and you have not lost more blood.    At home, please take your torsemide as you always do.  Start taking METOLAZONE 2.49m every day for 5 days (starting with today and through Sunday this weekend).  Please come back to the clinic in 1 week, please make an appointment before you leave today.    For itching, try hydroxyzine 135mas needed at nighttime.   Please call with any questions or come in to the ER for worsening weight gain, bleeding from your bowels, chest pain, fever, chills, severe nausea or vomiting, decreased urine output.

## 2015-09-17 NOTE — Progress Notes (Signed)
Hypoglycemic Event  CBG: 56  Treatment: Apple Juice and glucose tablets   Symptoms:  Weakness   Follow-up CBG: Time: 47  Repeated Apple juice and Glucose tablets.  Drawn sample was 81 Possible Reasons for Event: Did not eat   Comments/MD notified: Dr. Linwood Dibbles, Trey Sailors

## 2015-09-17 NOTE — Progress Notes (Signed)
Subjective:    Patient ID: [name], female    DOB: [birth year], 65 y.o.   MRN: [id number]  CC: Follow up for CHF, pulmonary HTN, diabetes  HPI   [name] is a 65yo woman with PMH of pulmonary HTN, diastolic CHF, sarcoid, chronic GIB due to AVMs and iron deficiency anemia, malnurition, DM2, HTN, but with recent hypotension and fainting, CKD and dysphagia who presents for routine follow up.   [name] has had a complicated course since I last saw her.  She was recently admitted for AKI on CKD and volume depletion.  She is on diuretics to control her volume overload due to CHF and during that hospitalization she was found to have colitis and had decreased PO intake.  Her creatinine improved with some fluids.   She was also admitted in April for AKI and an acute GIB.  She has AVMs and had had many GIB due to these.  She is now followed closely in the outpatient setting.    She was seen by Oncology on 6/12 and was given ferraheme.  She is due to see 09/22/15.  She further sees Cardiology and Pulmonology for her acute diagnoses of diastolic CHF and pulmonary HTN which are related to her sarcoidosis.  For the CHF, she is supposed to be on torsemide and metolazone (metolazone was stopped during last hospitalization however).  For Pulmonary HTN she is on ambrisentan 10 mg daily, selexipag 1600 mcg bid, riociguat 1 mg tid. For Afib, she is on amiodarone.   Today she reports feeling worse, she is about 6 pounds up from 6/16, but 11 pounds up from 6/13.  Her BS was low to about 54, she did not eat this morning.  She also notes that she has been swelling up and her legs and abdomen are much more swollen.  This makes her feel heavy and like she cannot walk.  She further notes that she has itching on her hip and may have a wound on her bottom.  By her report, she has been taking the torsemide but not the metolazone as were her instructions from discharge.      Review of Systems  Constitutional:  Positive for activity change, fatigue and unexpected weight change.  Respiratory: Negative for cough, shortness of breath and wheezing.   Cardiovascular: Positive for leg swelling. Negative for chest pain.  Gastrointestinal: Positive for abdominal pain and abdominal distention. Negative for diarrhea and constipation.  Skin: Negative for color change and pallor.  Neurological: Positive for dizziness, weakness and light-headedness.  Psychiatric/Behavioral: Negative for confusion and decreased concentration.       Objective:   Physical Exam  Constitutional: She is oriented to person, place, and time. No distress.  AA woman, appearing older than stated age  Cardiovascular: Normal rate and regular rhythm.  Exam reveals no friction rub.   No murmur heard. Pulmonary/Chest: Effort normal and breath sounds normal. No respiratory distress. She has no wheezes. She has no rales.  Abdominal: She exhibits distension. She exhibits no mass. There is tenderness. There is no rebound.  Decreased bowel sounds  Musculoskeletal: She exhibits edema and tenderness.  Neurological: She is alert and oriented to person, place, and time.  Skin: She is not diaphoretic.    STAT CBC and BMET  Renal function noted with BUN of 37 and Cr of 1.68.  This is within her range of CKD.  H/H were relatively stable.   Hypoglycemia protocol with juice and glucose tabs - BMET  Glucose was 81      Assessment & Plan:

## 2015-09-19 ENCOUNTER — Other Ambulatory Visit: Payer: Self-pay

## 2015-09-19 DIAGNOSIS — D509 Iron deficiency anemia, unspecified: Secondary | ICD-10-CM

## 2015-09-19 NOTE — Assessment & Plan Note (Signed)
She is having some "swimmy headedness" when standing, but this quickly resolves.   Main issue today is volume overload.

## 2015-09-19 NOTE — Assessment & Plan Note (Signed)
I think her current situation is related to her metolazone being stopped.  Her main symptoms is "heaviness" to her legs and weight gain.    Stat labs are reassuring that she is not in acute renal failure on CKD Her symptoms seem to have started acutely after her stopping her 2nd diuretic She has had issues with low blood pressure, syncope and dehydration with continuing her metolazone, but now seems to have worsening volume overload  Plan Metolazone 2.38m X 5 days, continue torsemide Return to clinic in 1 week in acute care clinic for BMET and evaluation of weight If no improvement, consider inpatient stay for IV diuresis

## 2015-09-19 NOTE — Assessment & Plan Note (Signed)
Hypoglycemic today.  Improved with sugar tablets and juice.   She is on no medications  Advised to eat more regularly and to have candy on her person in the event of another hypoglycemic episode.

## 2015-09-19 NOTE — Assessment & Plan Note (Signed)
Skin reviewed with nursing, Yvonna Alanis.  She has a small skin tear at the top of the gluteal cleft.  Her wound around the gluteal cleft is much improved.  She does report itching.  She has had success with low dose hydroxyzine in the past.   Plan Refill hydroxyzine 33m PRN itching.

## 2015-09-22 ENCOUNTER — Observation Stay (HOSPITAL_COMMUNITY)
Admission: EM | Admit: 2015-09-22 | Discharge: 2015-09-24 | Disposition: A | Discharge status: Home Health | Payer: Medicare Other | Attending: Internal Medicine | Admitting: Internal Medicine

## 2015-09-22 ENCOUNTER — Telehealth: Payer: Self-pay | Admitting: *Deleted

## 2015-09-22 ENCOUNTER — Other Ambulatory Visit (HOSPITAL_COMMUNITY): Payer: Self-pay

## 2015-09-22 ENCOUNTER — Ambulatory Visit: Payer: Medicare Other | Admitting: Hematology and Oncology

## 2015-09-22 ENCOUNTER — Other Ambulatory Visit: Payer: Self-pay

## 2015-09-22 ENCOUNTER — Ambulatory Visit: Payer: Medicare Other

## 2015-09-22 ENCOUNTER — Encounter (HOSPITAL_COMMUNITY): Payer: Self-pay | Admitting: Vascular Surgery

## 2015-09-22 ENCOUNTER — Ambulatory Visit: Payer: Medicare Other | Admitting: Nurse Practitioner

## 2015-09-22 ENCOUNTER — Emergency Department (HOSPITAL_COMMUNITY): Payer: Medicare Other

## 2015-09-22 ENCOUNTER — Other Ambulatory Visit: Payer: Medicare Other

## 2015-09-22 DIAGNOSIS — K3184 Gastroparesis: Secondary | ICD-10-CM | POA: Insufficient documentation

## 2015-09-22 DIAGNOSIS — K529 Noninfective gastroenteritis and colitis, unspecified: Secondary | ICD-10-CM | POA: Diagnosis present

## 2015-09-22 DIAGNOSIS — Z79899 Other long term (current) drug therapy: Secondary | ICD-10-CM | POA: Diagnosis not present

## 2015-09-22 DIAGNOSIS — D869 Sarcoidosis, unspecified: Secondary | ICD-10-CM | POA: Insufficient documentation

## 2015-09-22 DIAGNOSIS — I272 Other secondary pulmonary hypertension: Secondary | ICD-10-CM | POA: Insufficient documentation

## 2015-09-22 DIAGNOSIS — R4182 Altered mental status, unspecified: Secondary | ICD-10-CM | POA: Diagnosis not present

## 2015-09-22 DIAGNOSIS — E669 Obesity, unspecified: Secondary | ICD-10-CM | POA: Insufficient documentation

## 2015-09-22 DIAGNOSIS — R627 Adult failure to thrive: Secondary | ICD-10-CM | POA: Insufficient documentation

## 2015-09-22 DIAGNOSIS — R262 Difficulty in walking, not elsewhere classified: Secondary | ICD-10-CM | POA: Insufficient documentation

## 2015-09-22 DIAGNOSIS — I130 Hypertensive heart and chronic kidney disease with heart failure and stage 1 through stage 4 chronic kidney disease, or unspecified chronic kidney disease: Secondary | ICD-10-CM | POA: Insufficient documentation

## 2015-09-22 DIAGNOSIS — N179 Acute kidney failure, unspecified: Secondary | ICD-10-CM | POA: Diagnosis not present

## 2015-09-22 DIAGNOSIS — A047 Enterocolitis due to Clostridium difficile: Secondary | ICD-10-CM | POA: Diagnosis not present

## 2015-09-22 DIAGNOSIS — Z6826 Body mass index (BMI) 26.0-26.9, adult: Secondary | ICD-10-CM | POA: Insufficient documentation

## 2015-09-22 DIAGNOSIS — D573 Sickle-cell trait: Secondary | ICD-10-CM | POA: Insufficient documentation

## 2015-09-22 DIAGNOSIS — D509 Iron deficiency anemia, unspecified: Secondary | ICD-10-CM | POA: Insufficient documentation

## 2015-09-22 DIAGNOSIS — I959 Hypotension, unspecified: Secondary | ICD-10-CM | POA: Insufficient documentation

## 2015-09-22 DIAGNOSIS — I5032 Chronic diastolic (congestive) heart failure: Secondary | ICD-10-CM | POA: Insufficient documentation

## 2015-09-22 DIAGNOSIS — K219 Gastro-esophageal reflux disease without esophagitis: Secondary | ICD-10-CM | POA: Diagnosis not present

## 2015-09-22 DIAGNOSIS — R1031 Right lower quadrant pain: Secondary | ICD-10-CM

## 2015-09-22 DIAGNOSIS — E1143 Type 2 diabetes mellitus with diabetic autonomic (poly)neuropathy: Secondary | ICD-10-CM | POA: Diagnosis not present

## 2015-09-22 DIAGNOSIS — E43 Unspecified severe protein-calorie malnutrition: Secondary | ICD-10-CM | POA: Diagnosis not present

## 2015-09-22 DIAGNOSIS — E039 Hypothyroidism, unspecified: Secondary | ICD-10-CM | POA: Diagnosis not present

## 2015-09-22 DIAGNOSIS — I480 Paroxysmal atrial fibrillation: Secondary | ICD-10-CM | POA: Diagnosis not present

## 2015-09-22 DIAGNOSIS — R188 Other ascites: Secondary | ICD-10-CM | POA: Diagnosis not present

## 2015-09-22 DIAGNOSIS — E876 Hypokalemia: Secondary | ICD-10-CM | POA: Insufficient documentation

## 2015-09-22 DIAGNOSIS — R402421 Glasgow coma scale score 9-12, in the field [EMT or ambulance]: Secondary | ICD-10-CM | POA: Diagnosis not present

## 2015-09-22 DIAGNOSIS — E1122 Type 2 diabetes mellitus with diabetic chronic kidney disease: Secondary | ICD-10-CM | POA: Insufficient documentation

## 2015-09-22 DIAGNOSIS — M349 Systemic sclerosis, unspecified: Secondary | ICD-10-CM | POA: Diagnosis not present

## 2015-09-22 DIAGNOSIS — N189 Chronic kidney disease, unspecified: Secondary | ICD-10-CM | POA: Diagnosis not present

## 2015-09-22 DIAGNOSIS — E1142 Type 2 diabetes mellitus with diabetic polyneuropathy: Secondary | ICD-10-CM | POA: Insufficient documentation

## 2015-09-22 LAB — I-STAT TROPONIN, ED: Troponin i, poc: 0 ng/mL (ref 0.00–0.08)

## 2015-09-22 LAB — CBC WITH DIFFERENTIAL/PLATELET
BASOS ABS: 0 10*3/uL (ref 0.0–0.1)
Basophils Relative: 0 %
EOS ABS: 0.1 10*3/uL (ref 0.0–0.7)
Eosinophils Relative: 1 %
HEMATOCRIT: 26.7 % — AB (ref 36.0–46.0)
HEMOGLOBIN: 8.9 g/dL — AB (ref 12.0–15.0)
LYMPHS PCT: 10 %
Lymphs Abs: 1.1 10*3/uL (ref 0.7–4.0)
MCH: 25.4 pg — ABNORMAL LOW (ref 26.0–34.0)
MCHC: 33.3 g/dL (ref 30.0–36.0)
MCV: 76.1 fL — ABNORMAL LOW (ref 78.0–100.0)
MONOS PCT: 12 %
Monocytes Absolute: 1.3 10*3/uL — ABNORMAL HIGH (ref 0.1–1.0)
NEUTROS ABS: 8.2 10*3/uL — AB (ref 1.7–7.7)
NEUTROS PCT: 77 %
PLATELETS: 185 10*3/uL (ref 150–400)
RBC: 3.51 MIL/uL — AB (ref 3.87–5.11)
RDW: 21.4 % — ABNORMAL HIGH (ref 11.5–15.5)
WBC: 10.7 10*3/uL — AB (ref 4.0–10.5)

## 2015-09-22 LAB — COMPREHENSIVE METABOLIC PANEL
ALBUMIN: 2.8 g/dL — AB (ref 3.5–5.0)
ALK PHOS: 123 U/L (ref 38–126)
ALT: 6 U/L — ABNORMAL LOW (ref 14–54)
ANION GAP: 11 (ref 5–15)
AST: 14 U/L — ABNORMAL LOW (ref 15–41)
BILIRUBIN TOTAL: 0.7 mg/dL (ref 0.3–1.2)
BUN: 45 mg/dL — ABNORMAL HIGH (ref 6–20)
CALCIUM: 8.5 mg/dL — AB (ref 8.9–10.3)
CO2: 24 mmol/L (ref 22–32)
Chloride: 100 mmol/L — ABNORMAL LOW (ref 101–111)
Creatinine, Ser: 2.29 mg/dL — ABNORMAL HIGH (ref 0.44–1.00)
GFR, EST AFRICAN AMERICAN: 25 mL/min — AB (ref >60)
GFR, EST NON AFRICAN AMERICAN: 22 mL/min — AB (ref >60)
GLUCOSE: 96 mg/dL (ref 65–99)
POTASSIUM: 3.4 mmol/L — AB (ref 3.5–5.1)
Sodium: 135 mmol/L (ref 135–145)
TOTAL PROTEIN: 7.3 g/dL (ref 6.5–8.1)

## 2015-09-22 LAB — GLUCOSE, CAPILLARY
GLUCOSE-CAPILLARY: 57 mg/dL — AB (ref 65–99)
GLUCOSE-CAPILLARY: 65 mg/dL (ref 65–99)

## 2015-09-22 LAB — I-STAT VENOUS BLOOD GAS, ED
Bicarbonate: 24.6 mEq/L — ABNORMAL HIGH (ref 20.0–24.0)
O2 Saturation: 68 %
TCO2: 26 mmol/L (ref 0–100)
pCO2, Ven: 37.1 mmHg — ABNORMAL LOW (ref 45.0–50.0)
pH, Ven: 7.43 — ABNORMAL HIGH (ref 7.250–7.300)
pO2, Ven: 34 mmHg (ref 31.0–45.0)

## 2015-09-22 LAB — URINE MICROSCOPIC-ADD ON

## 2015-09-22 LAB — URINALYSIS, ROUTINE W REFLEX MICROSCOPIC
Bilirubin Urine: NEGATIVE
GLUCOSE, UA: NEGATIVE mg/dL
Hgb urine dipstick: NEGATIVE
Ketones, ur: NEGATIVE mg/dL
NITRITE: NEGATIVE
PROTEIN: NEGATIVE mg/dL
Specific Gravity, Urine: 1.01 (ref 1.005–1.030)
pH: 6.5 (ref 5.0–8.0)

## 2015-09-22 LAB — AMMONIA: AMMONIA: 45 umol/L — AB (ref 9–35)

## 2015-09-22 LAB — PROTIME-INR
INR: 1.58 — AB (ref 0.00–1.49)
Prothrombin Time: 18.9 seconds — ABNORMAL HIGH (ref 11.6–15.2)

## 2015-09-22 LAB — I-STAT CG4 LACTIC ACID, ED: Lactic Acid, Venous: 0.91 mmol/L (ref 0.5–1.9)

## 2015-09-22 MED ORDER — VANCOMYCIN 50 MG/ML ORAL SOLUTION
125.0000 mg | Freq: Four times a day (QID) | ORAL | Status: DC
  Administered 2015-09-23 – 2015-09-24 (×7): 125 mg via ORAL
  Filled 2015-09-22 (×8): qty 2.5

## 2015-09-22 MED ORDER — ONDANSETRON HCL 4 MG/2ML IJ SOLN: 4.0000 mg | Freq: Four times a day (QID) | INTRAMUSCULAR | Status: DC | PRN

## 2015-09-22 MED ORDER — RIOCIGUAT 2.5 MG PO TABS
2.5000 mg | ORAL_TABLET | Freq: Every day | ORAL | Status: DC
  Administered 2015-09-23 – 2015-09-24 (×2): 2.5 mg via ORAL
  Filled 2015-09-22 (×3): qty 1

## 2015-09-22 MED ORDER — FLUOXETINE HCL 20 MG PO CAPS
20.0000 mg | ORAL_CAPSULE | Freq: Every morning | ORAL | Status: DC
  Administered 2015-09-23 – 2015-09-24 (×2): 20 mg via ORAL
  Filled 2015-09-22 (×3): qty 1

## 2015-09-22 MED ORDER — NORTRIPTYLINE HCL 10 MG PO CAPS
20.0000 mg | ORAL_CAPSULE | Freq: Every day | ORAL | Status: DC
  Administered 2015-09-23 (×2): 20 mg via ORAL
  Filled 2015-09-22 (×3): qty 2

## 2015-09-22 MED ORDER — PANTOPRAZOLE SODIUM 40 MG PO TBEC
40.0000 mg | DELAYED_RELEASE_TABLET | Freq: Every day | ORAL | Status: DC
  Administered 2015-09-23 – 2015-09-24 (×2): 40 mg via ORAL
  Filled 2015-09-22 (×3): qty 1

## 2015-09-22 MED ORDER — HYDROCODONE-ACETAMINOPHEN 10-325 MG PO TABS
1.0000 | ORAL_TABLET | Freq: Four times a day (QID) | ORAL | Status: DC
  Administered 2015-09-23 – 2015-09-24 (×7): 1 via ORAL
  Filled 2015-09-22 (×7): qty 1

## 2015-09-22 MED ORDER — LEVOTHYROXINE SODIUM 25 MCG PO TABS
25.0000 ug | ORAL_TABLET | Freq: Every day | ORAL | Status: DC
  Administered 2015-09-23 – 2015-09-24 (×2): 25 ug via ORAL
  Filled 2015-09-22 (×2): qty 1

## 2015-09-22 MED ORDER — AMBRISENTAN 5 MG PO TABS: 10.0000 mg | ORAL_TABLET | Freq: Every day | ORAL | Status: DC

## 2015-09-22 MED ORDER — BOOST / RESOURCE BREEZE PO LIQD
1.0000 | Freq: Three times a day (TID) | ORAL | Status: DC
  Administered 2015-09-23: 1 via ORAL

## 2015-09-22 MED ORDER — ENOXAPARIN SODIUM 30 MG/0.3ML ~~LOC~~ SOLN
30.0000 mg | SUBCUTANEOUS | Status: DC
  Administered 2015-09-23 – 2015-09-24 (×2): 30 mg via SUBCUTANEOUS
  Filled 2015-09-22 (×3): qty 0.3

## 2015-09-22 MED ORDER — SELEXIPAG 1600 MCG PO TABS
1600.0000 ug | ORAL_TABLET | Freq: Two times a day (BID) | ORAL | Status: DC
  Administered 2015-09-23 – 2015-09-24 (×4): 1600 ug via ORAL
  Filled 2015-09-22 (×6): qty 1

## 2015-09-22 MED ORDER — AMIODARONE HCL 200 MG PO TABS
200.0000 mg | ORAL_TABLET | Freq: Every day | ORAL | Status: DC
  Administered 2015-09-23 – 2015-09-24 (×2): 200 mg via ORAL
  Filled 2015-09-22 (×3): qty 1

## 2015-09-22 MED ORDER — FENOFIBRATE 54 MG PO TABS
54.0000 mg | ORAL_TABLET | Freq: Every day | ORAL | Status: DC
  Administered 2015-09-23 – 2015-09-24 (×2): 54 mg via ORAL
  Filled 2015-09-22 (×3): qty 1

## 2015-09-22 MED ORDER — PREGABALIN 75 MG PO CAPS
150.0000 mg | ORAL_CAPSULE | Freq: Three times a day (TID) | ORAL | Status: DC
  Administered 2015-09-23 (×3): 150 mg via ORAL
  Filled 2015-09-22 (×3): qty 2

## 2015-09-22 MED ORDER — SODIUM CHLORIDE 0.9 % IV SOLN
INTRAVENOUS | Status: AC
  Administered 2015-09-23: 01:00:00 via INTRAVENOUS

## 2015-09-22 MED ORDER — ONDANSETRON HCL 4 MG PO TABS
4.0000 mg | ORAL_TABLET | Freq: Four times a day (QID) | ORAL | Status: DC | PRN
Start: 2015-09-22 — End: 2015-09-24

## 2015-09-22 MED ORDER — METRONIDAZOLE IN NACL 5-0.79 MG/ML-% IV SOLN
500.0000 mg | Freq: Once | INTRAVENOUS | Status: AC
  Administered 2015-09-22: 500 mg via INTRAVENOUS
  Filled 2015-09-22: qty 100

## 2015-09-22 MED ORDER — POTASSIUM CHLORIDE CRYS ER 20 MEQ PO TBCR
40.0000 meq | EXTENDED_RELEASE_TABLET | Freq: Once | ORAL | Status: AC
  Administered 2015-09-23: 40 meq via ORAL
  Filled 2015-09-22: qty 2

## 2015-09-22 NOTE — ED Notes (Signed)
Patient transported to CT

## 2015-09-22 NOTE — Assessment & Plan Note (Deleted)
Acute on chronic severe iron deficiency anemia ( unresponsive to oral iron therapy) : Hospitalization 02/20/2015 to 02/26/2015 (Hb 5.5 angiodysplasia of colon with hemorrhage , CHF due to PAH, IDA, DM2, CKD-3) 5 Units of PRBC given. Colonoscopy 02/24/2015: numerous AVMs Undergone ablation Hospitalized 06/19/2015 to 06/27/2015: Active GI bleeding  Prior IV iron treatments: 05/04/2014 2, 12/24/2014 2, 01/29/2015 2, 02/25/2015 1, 05/02/15 x 2, 06/30/2015 X 1; 08/25/2015 1 Hospitalization 08/26/2015 to 08/30/2015 for colitis and acute renal failure  Plan: 1. CBC checked today and I recommended to transfuse IV iron feraheme 510 mg. She has transportation issues. (Hb 8 gms during recent hospitalization) 2. Follow-up with GI and cardiology  Return to clinic in 1 months for follow-up with labs and will block time for IV iron infusion.

## 2015-09-22 NOTE — Telephone Encounter (Signed)
[place] RN in home with patient who is lethargic, weak, unable to stand, and refusing to eat. BP of 92/42. RN noted a distended abdomen. As we have no appointments until Wednesday I advised her to have family bring to ED.

## 2015-09-22 NOTE — ED Notes (Signed)
Pt reports to the ED via [facility] for eval of lethargy and increased abd distention. Palliative care called EMS for this increased lethargy. Normally patient is ambulatory and alert. At this time she is just moaning and not speaking. Per EMS she has had decreased LOC x 1 week. She has also been complaining of abd pain per family. She was non-verbal en route for EMS. Pt was recently admitted and treated for C. Diff. Pt lethargic, arousable to physical stimuli, and she is nonverbal at this time. Resp e/u and skin warm and dry.

## 2015-09-22 NOTE — ED Notes (Signed)
Dr. Thomasene Lot at bedside

## 2015-09-22 NOTE — ED Notes (Signed)
Pt transferred to bedside toilet X 2 assist

## 2015-09-22 NOTE — H&P (Signed)
Date: 09/22/2015               Patient Name:  [name] MRN: [id number]  DOB: 1950/05/09 Age / Sex: 65 y.o., female   PCP: Sid Falcon, MD         Medical Service: Internal Medicine Teaching Service         Attending Physician: Dr. Bartholomew Crews, MD    First Contact: Dr. Wynetta Emery Pager: [phone]  Second Contact: Dr. Randell Patient Pager: [phone]       After Hours (After 5p/  First Contact Pager: [phone]  weekends / holidays): Second Contact Pager: [phone]   Chief Complaint: I've been feeling sick and not eating since I left the hospital  History of Present Illness: [name] is a 65 year old woman with history of recent C Diff infection (s/p 10 day course metronidazole), systemic sclerosis (c/b PAH, diastolic HF, recurrent GI bleeds from AVMs), and CKD3, and who presents with 3 weeks of malaise, fatigue, and abdominal pain.  Three weeks ago, [name] was discharged from the hospital after a 5 day admission for failure to thrive, complicated by C Diff colitis and acute on chronic kidney disease.  Since that time, she reports feeling ill, with general malaise, fatigue, abdominal pain, and anorexia.  Her pain is in the lower abdomen, constant, and not related to eating or defecating, and does not radiate.  She has been having ~2 loose, pink-tinged stools per day, urinating less frequently than usual, and eating little due to poor appetite.  She finished her 10-day course of metronidazole for C Diff.  No fevers, no sick contacts.  ROS notable for stable pain in feet which she attributes to diabetic neuropathy.    Meds: Current Facility-Administered Medications  Medication Dose Route Frequency Provider Last Rate Last Dose  . 0.9 %  sodium chloride infusion   Intravenous Continuous Tasrif Ahmed, MD      . Derrill Memo ON 09/23/2015] ambrisentan (LETAIRIS) tablet 10 mg  10 mg Oral Daily Tasrif Ahmed, MD      . Derrill Memo ON 09/23/2015] amiodarone (PACERONE) tablet 200 mg  200 mg Oral Daily  Tasrif Ahmed, MD      . Derrill Memo ON 09/23/2015] enoxaparin (LOVENOX) injection 30 mg  30 mg Subcutaneous Q24H Tasrif Ahmed, MD      . Derrill Memo ON 09/23/2015] feeding supplement (BOOST / RESOURCE BREEZE) liquid 1 Container  1 Container Oral TID BM Tasrif Ahmed, MD      . Derrill Memo ON 09/23/2015] fenofibrate tablet 54 mg  54 mg Oral Daily Tasrif Ahmed, MD      . Derrill Memo ON 09/23/2015] FLUoxetine (PROZAC) capsule 20 mg  20 mg Oral q morning - 10a Tasrif Ahmed, MD      . Derrill Memo ON 09/23/2015] HYDROcodone-acetaminophen (NORCO) 10-325 MG per tablet 1 tablet  1 tablet Oral Q6H Tasrif Ahmed, MD      . Derrill Memo ON 09/23/2015] levothyroxine (SYNTHROID, LEVOTHROID) tablet 25 mcg  25 mcg Oral QAC breakfast Tasrif Ahmed, MD      . nortriptyline (PAMELOR) capsule 20 mg  20 mg Oral QHS Tasrif Ahmed, MD      . ondansetron (ZOFRAN) tablet 4 mg  4 mg Oral Q6H PRN Tasrif Ahmed, MD       Or  . ondansetron (ZOFRAN) injection 4 mg  4 mg Intravenous Q6H PRN Tasrif Ahmed, MD      . Derrill Memo ON 09/23/2015] pantoprazole (PROTONIX) EC tablet 40 mg  40 mg Oral  Daily Tasrif Ahmed, MD      . potassium chloride SA (K-DUR,KLOR-CON) CR tablet 40 mEq  40 mEq Oral Once Tasrif Ahmed, MD      . pregabalin (LYRICA) capsule 150 mg  150 mg Oral Q8H Tasrif Ahmed, MD      . Derrill Memo ON 09/23/2015] Riociguat TABS 2.5 mg  2.5 mg Oral Daily Tasrif Ahmed, MD      . Selexipag TABS 1,600 mcg  1,600 mcg Oral BID Tasrif Ahmed, MD      . Derrill Memo ON 09/23/2015] vancomycin (VANCOCIN) 50 mg/mL oral solution 125 mg  125 mg Oral Q6H Tasrif Ahmed, MD        Allergies: Allergies as of 09/22/2015 - Review Complete 09/22/2015  Allergen Reaction Noted  . Morphine and related Other (See Comments) 09/22/2015  . Cephalexin Rash 05/01/2010  . Ciprofloxacin Rash 07/23/2010  . Codeine Other (See Comments)   . Contrast media [iodinated diagnostic agents] Hives 08/03/2011  . Iohexol Hives 03/04/2008   Past Medical History  Diagnosis Date  . Secondary pulmonary hypertension  ([place])     a. 2/2 scleroderma  . GERD (gastroesophageal reflux disease)   . Systemic sclerosis ([place])   . Unspecified essential hypertension   . Gastroparesis   . Sickle cell trait ([place])   . Obesity   . Scleroderma ([place])   . Trichomonas   . Peripheral neuropathy ([place])   . Type II diabetes mellitus ([facility])     a. diet control   . Arthritis   . Chronic kidney disease   . Gout   . Hypothyroidism     a. may be due to amiodarone use. started on synthroid on 12/2014 admission   . Anemia     a.  hx of GI bleed and duodenal AVMs  . Chronic diastolic CHF (congestive heart failure) ([place])     a. Echo 12/23/14 withEF 60-65%, moderately dilated RV, PASP 72, no pericardial effusion.  Marland Kitchen PAF (paroxysmal atrial fibrillation) ([facility])     a. not a long term AC candidate due to GI bleeds  . Angiodysplasia of colon with hemorrhage   . Dysrhythmia   . Transfusion history     last 06-21-15    Family History: Mother with CAD, DM.  Social History: No alchohol, tobacco, or illicit drug use.  Lives alone with help from granddaughter.  Review of Systems: A complete ROS was negative except as per HPI.  Physical Exam: Blood pressure 100/51, pulse 68, temperature 99.3 F (37.4 C), temperature source Oral, resp. rate 13, SpO2 100 %.  Physical Exam  Constitutional:  Tired, chronically-ill appearing woman, appears older than chronological age, slumped in her stretcher with her eyes closed  HENT:  Head: Normocephalic and atraumatic.  Mouth/Throat: Oropharynx is clear and moist.  Eyes: Conjunctivae are normal. No scleral icterus.  Proptosis  Neck: Normal range of motion. Neck supple.  Cardiovascular: Normal rate, regular rhythm, normal heart sounds and intact distal pulses.   Pulmonary/Chest: Effort normal. No respiratory distress.  R > L bibasilar crackles  Abdominal:  Mild, diffuse tenderness to palpation.  RUQ fullness to deep palpation. No guarding or rigidity.  Musculoskeletal: She exhibits no edema  or tenderness.  Neurological:  Somnolent but easily arousable, oriented to person, place, and time, moves all extremities well.  Skin: Skin is warm and dry. No rash noted. No erythema.  Psychiatric:  Increased speech latency, slow rate, enunciation mildly slurred, affect constricted    EKG: normal sinus rhythm, inferoseptal TWI (aVF, V1, V2),  1st degree AV block, RBBB  CT Abd Pelvis: Increasing bowel wall thickening in ascending and transverse colon from 6/13 prior.  New small volume ascites.  CBC Latest Ref Rng 09/22/2015 09/17/2015 08/30/2015  WBC 4.0 - 10.5 K/uL 10.7(H) 5.3 10.1  Hemoglobin 12.0 - 15.0 g/dL 8.9(L) 8.0(L) 8.8(L)  Hematocrit 36.0 - 46.0 % 26.7(L) 24.6(L) 27.4(L)  Platelets 150 - 400 K/uL 185 165 220   CMP Latest Ref Rng 09/22/2015 09/17/2015 08/30/2015  Glucose 65 - 99 mg/dL 96 81 101(H)  BUN 6 - 20 mg/dL 45(H) 37(H) 62(H)  Creatinine 0.44 - 1.00 mg/dL 2.29(H) 1.68(H) 1.64(H)  Sodium 135 - 145 mmol/L 135 138 137  Potassium 3.5 - 5.1 mmol/L 3.4(L) 4.0 3.4(L)  Chloride 101 - 111 mmol/L 100(L) 105 106  CO2 22 - 32 mmol/L 24 25 19(L)  Calcium 8.9 - 10.3 mg/dL 8.5(L) 8.2(L) 9.0  Total Protein 6.5 - 8.1 g/dL 7.3 - -  Total Bilirubin 0.3 - 1.2 mg/dL 0.7 - -  Alkaline Phos 38 - 126 U/L 123 - -  AST 15 - 41 U/L 14(L) - -  ALT 14 - 54 U/L 6(L) - -   Lactic Acid, Venous    Component Value Date/Time   LATICACIDVEN 0.91 09/22/2015 1720    Urinalysis    Component Value Date/Time   COLORURINE YELLOW 09/22/2015 2046   APPEARANCEUR CLOUDY* 09/22/2015 2046   LABSPEC 1.010 09/22/2015 2046   PHURINE 6.5 09/22/2015 2046   GLUCOSEU NEGATIVE 09/22/2015 2046   HGBUR NEGATIVE 09/22/2015 2046   BILIRUBINUR NEGATIVE 09/22/2015 2046   KETONESUR NEGATIVE 09/22/2015 2046   PROTEINUR NEGATIVE 09/22/2015 2046   UROBILINOGEN 0.2 08/03/2013 1200   NITRITE NEGATIVE 09/22/2015 2046   LEUKOCYTESUR SMALL* 09/22/2015 2046    Colonoscopy (02/2015): Numerous colonics AVMs EGF (05/2015):  Numerous duodenal and jejunal AVMs  Assessment & Plan by Problem: Active Problems:   Pulmonary hypertension ([facility])   Chronic diastolic CHF (congestive heart failure) ([facility])   Colitis   Anemia, iron deficiency     #Concern for Sepsis #Colitis With radiologic findings of worsening colitis and recent history of C Diff infection, 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  Though she is afebrile, she does have a slightly elevated white count and concern for potential sepsis (qSOFA 2, slurred speech, hypotension).  Other bacterial etiologies of colitis are possible, but less likely given her lack of exposures, travel, and sick contacts.  Normal immune status makes viral colitis less likely.  Acute ischemic colitis is less likely due to the subacute timecourse and stable symptomatology.  Chronic ischemic colitis might be worth considering if C Diff testing is negative, and her connective tissue disorder puts her at risk. Recent upper and lower endoscopy did not show findings to suggest IBD.  Alternatively, her failure to thrive could be due to her many chronic medical conditions, anemia, and poor nutrition/hydration, with overlaid GI symptoms from her systemic sclerosis (gastroparesis, intestinal hypomotility).   -C Diff testing -PO Vancomycin -Gentle IVF  #Acute on Chronic Renal Insufficiency Current baseline Creatinine uncertain in the context of multiple recent acute illnesses.  Cr 36% increase from last discharge, 143% increase from 06/2015.  Hypotension, history of low PO intake, and BUN/Cr of 20 consistent with prerenal azotemia. -Gentle IVF -Repeat BMP  #Hypokalemia -Replete K -Repeat BMP  #Systemic Sclerosis #Diastolic HF #Pulmonary HTN -Hold diuretics -Likely volume sensitive, do not volume overload -Continue home PAH meds  #Iron Deficiency Anemia  At baseline  #Paroxsysmal AFib #Hypothyroidism #Diabetic  Neuropathy -Continue home meds    Dispo: Admit patient to Observation with expected length of stay less than 2 midnights.  Signed: Minus Liberty, MD 09/22/2015, 10:36 PM  Pager: [phone]

## 2015-09-22 NOTE — ED Notes (Signed)
Admitting at bedside

## 2015-09-22 NOTE — ED Notes (Signed)
Placed patient into gown and on the monitor

## 2015-09-22 NOTE — Telephone Encounter (Signed)
Agree with plan.  That BP is actually average for her, but she has been having issues with volume overload and she was on a short course of metolazone over the weekend.  I think ED evaluation is very appropriate in this setting.

## 2015-09-22 NOTE — ED Provider Notes (Addendum)
CSN: [id number]     Arrival date & time 09/22/15  1601 History   First MD Initiated Contact with Patient 09/22/15 1608     Chief Complaint  Patient presents with  . 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  Patient has palliative care at home. Today reportedly palliative care called ems for increasing lethargy at home.  Patient unable to give good history today secondary to lethargy.  patietn able to communicate that her abdomen hurts mostlyu on the right side and she has had some diarrhea.   No vimtiing, fever SOB or cough.  Patient is a 65 y.o. female presenting with altered mental status.  Altered Mental Status Associated symptoms: no rash     Past Medical History  Diagnosis Date  . Secondary pulmonary hypertension ([place])     a. 2/2 scleroderma  . GERD (gastroesophageal reflux disease)   . Systemic sclerosis ([place])   . Unspecified essential hypertension   . Gastroparesis   . Sickle cell trait ([place])   . Obesity   . Scleroderma ([place])   . Trichomonas   . Peripheral neuropathy ([place])   . Type II diabetes mellitus (HCC)     a. diet control   . Arthritis   . Chronic kidney disease   . Gout   . Hypothyroidism     a. may be due to amiodarone use. started on synthroid on 12/2014 admission   . Anemia     a.  hx of GI bleed and duodenal AVMs  . Chronic diastolic CHF (congestive heart failure) ([place])     a. Echo 12/23/14 withEF 60-65%, moderately dilated RV, PASP 72, no pericardial effusion.  Marland Kitchen PAF (paroxysmal atrial fibrillation) (HCC)     a. not a long term AC candidate due to GI bleeds  . Angiodysplasia of colon with hemorrhage   . Dysrhythmia   . Transfusion  history     last 06-21-15   Past Surgical History  Procedure Laterality Date  . Tubal ligation    . Esophagogastroduodenoscopy  02/23/2010    D 2 AVM, ablated with APC.   Marland Kitchen Shoulder arthroscopy Right 12/2009    subacromial decompression  . Replacement total knee Left 11/2000  . Cardiac catheterization Right 04/24/2004  . Tonsillectomy  1960's  . Abdominal hysterectomy  1983    "partial"  . Shoulder arthroscopy w/ rotator cuff repair Right 01/2010  . Excisional total knee arthroplasty with antibiotic spacers Left 08/2010    "got infected & had to take 1st replacement out"  . Revision total knee arthroplasty Left 11/2010    "removed spacers; replaced knee"  . Total knee revision with scar debridement/patella revision with poly exchange Left 12/2010    fell; knee split opened; had to redo revision"  . Cardiac catheterization Left 07/2010  . Peripherally inserted central catheter insertion  09/2010  . Colonoscopy  09/06/2008, 09/2013    int rrhoids:Dr. Lajoyce Corners 2010. Pan-colonic AVMs, microscopic colitis per Dr Deatra Ina 2015  . Right heart catheterization N/A 03/18/2014    Procedure: RIGHT HEART CATH;  Surgeon: Larey Dresser, MD;  Location: [facility] CATH LAB;  Service: Cardiovascular;  Laterality: N/A;  . Esophagogastroduodenoscopy N/A 03/23/2014    Procedure: ESOPHAGOGASTRODUODENOSCOPY (EGD);  Surgeon: Ladene Artist, MD;  Location: [facility]  ENDOSCOPY;  Service: Endoscopy;  Laterality: N/A;  . Pericardial tap N/A 05/03/2014    Procedure: PERICARDIAL TAP;  Surgeon: Sinclair Grooms, MD;  Location: [facility] CATH LAB;  Service: Cardiovascular;  Laterality: N/A;  . Esophagogastroduodenoscopy N/A 02/21/2015    Procedure: ESOPHAGOGASTRODUODENOSCOPY (EGD);  Surgeon: Ladene Artist, MD;  Location: [facility] ENDOSCOPY;  Service: Endoscopy;  Laterality: N/A;  . Colonoscopy N/A 02/24/2015    Procedure: COLONOSCOPY;  Surgeon: Gatha Mayer, MD;  Location: [place];  Service: Endoscopy;  Laterality: N/A;  . Enteroscopy N/A 05/14/2015     Procedure: ENTEROSCOPY;  Surgeon: Irene Shipper, MD;  Location: [place];  Service: Endoscopy;  Laterality: N/A;  . Enteroscopy N/A 06/22/2015    Procedure: ENTEROSCOPY;  Surgeon: Doran Stabler, MD;  Location[facility] ENDOSCOPY;  Service: Endoscopy;  Laterality: N/A;  . Hot hemostasis  06/22/2015    Procedure: HOT HEMOSTASIS (ARGON PLASMA COAGULATION/BICAP);  Surgeon: Doran Stabler, MD;  Location: [facility] ENDOSCOPY;  Service: Endoscopy;;   Family History  Problem Relation Age of Onset  . Heart disease Mother   . Diabetes Mother   . Diabetes Sister    Social History  Substance Use Topics  . Smoking status: Never Smoker   . Smokeless tobacco: Never Used  . Alcohol Use: No   OB History    Gravida Para Term Preterm AB TAB SAB Ectopic Multiple Living   _0 Review of Systems  Unable to perform ROS: Mental status change  Constitutional: Positive for fatigue.  Respiratory: Negative for cough.   Cardiovascular: Negative for chest pain.  Gastrointestinal: Positive for diarrhea.  Skin: Negative for rash.  All other systems reviewed and are negative.     Allergies  Morphine and related; Cephalexin; Ciprofloxacin; Codeine; Contrast media; and Iohexol  Home Medications   Prior to Admission medications   Medication Sig Start Date End Date Taking? Authorizing Provider  ambrisentan (LETAIRIS) 10 MG tablet Take 1 tablet (10 mg total) by mouth daily. 04/11/15   Larey Dresser, MD  amiodarone (PACERONE) 200 MG tablet Take 1 tablet (200 mg total) by mouth daily. 08/20/15   Larey Dresser, MD  esomeprazole (NEXIUM) 40 MG capsule Take 40 mg by mouth daily at 12 noon.     Historical Provider, MD  feeding supplement (BOOST / RESOURCE BREEZE) LIQD Take 1 Container by mouth 3 (three) times daily between meals. 08/30/15   Zada Finders, MD  fenofibrate 54 MG tablet Take 1 tablet (54 mg total) by mouth daily. 04/07/15   Sid Falcon, MD  FLUoxetine (PROZAC) 20 MG capsule Take 1 capsule  (20 mg total) by mouth every morning. 06/18/15   Sid Falcon, MD  HYDROcodone-acetaminophen (NORCO) 10-325 MG tablet Take 1 tablet by mouth every 6 (six) hours. pain 06/17/15   Historical Provider, MD  hydrOXYzine (ATARAX/VISTARIL) 10 MG tablet Take 1 tablet (10 mg total) by mouth 2 (two) times daily as needed for itching. 09/17/15   Sid Falcon, MD  levothyroxine (SYNTHROID, LEVOTHROID) 25 MCG tablet Take 1 tablet (25 mcg total) by mouth daily before breakfast. 12/28/14   Eileen Stanford, PA-C  metolazone (ZAROXOLYN) 2.5 MG tablet Take 2.5 mg by mouth 2 (two) times a week. Tuesdays and Fridays 09/02/15   Historical Provider, MD  nortriptyline (PAMELOR) 10 MG capsule Take 2 capsules (20 mg total) by mouth at bedtime. 03/19/15   Dennie Bible, NP  potassium chloride SA (K-DUR,KLOR-CON) 20 MEQ tablet Take 20 mEq by mouth daily. Take an additional 20 mEq when taking Metolazone    Historical Provider, MD  pregabalin (LYRICA) 150 MG capsule Take 1 capsule (150 mg total) by mouth 3 (three) times daily. 03/19/15   Dennie Bible, NP  Riociguat (ADEMPAS) 2.5 MG TABS Take by mouth.    Historical Provider, MD  Selexipag (UPTRAVI) 1600 MCG TABS Take 1,600 mcg by mouth 2 (two) times daily. 10/22/14   Jolaine Artist, MD  torsemide (DEMADEX) 20 MG tablet Take 3 tablets (60 mg total) by mouth daily. Patient taking differently: Take 60 mg by mouth 2 (two) times daily.  08/30/15   Zada Finders, MD   BP 96/55 mmHg  Pulse 67  Temp(Src) 99.3 F (37.4 C) (Oral)  Resp 12  SpO2 96% Physical Exam  Constitutional: She is oriented to person, place, and time. She appears well-developed and well-nourished.  HENT:  Head: Normocephalic and atraumatic.  Dry mucus membranes  Eyes: Conjunctivae are normal. Right eye exhibits no discharge.  Neck: Neck supple.  Cardiovascular: Normal rate, regular rhythm and normal heart sounds.   No murmur heard. Pulmonary/Chest: Effort normal and breath sounds normal. She has  no wheezes. She has no rales.  Abdominal: Soft. She exhibits no distension. There is no tenderness.  Distended. Tender to RLQ  Musculoskeletal: Normal range of motion. She exhibits edema.  bialteral LE  Neurological: She is oriented to person, place, and time. No cranial nerve deficit.  Slow to respond,  Skin: Skin is dry. No rash noted. She is not diaphoretic.  Nursing note and vitals reviewed.   ED Course  Procedures (including critical care time) Labs Review Labs Reviewed  COMPREHENSIVE METABOLIC PANEL - Abnormal; Notable for the following:    Potassium 3.4 (*)    Chloride 100 (*)    BUN 45 (*)    Creatinine, Ser 2.29 (*)    Calcium 8.5 (*)    Albumin 2.8 (*)    AST 14 (*)    ALT 6 (*)    GFR calc non Af Amer 22 (*)    GFR calc Af Amer 25 (*)    All other components within normal limits  CBC WITH DIFFERENTIAL/PLATELET - Abnormal; Notable for the following:    WBC 10.7 (*)    RBC 3.51 (*)    Hemoglobin 8.9 (*)    HCT 26.7 (*)    MCV 76.1 (*)    MCH 25.4 (*)    RDW 21.4 (*)    Neutro Abs 8.2 (*)    Monocytes Absolute 1.3 (*)    All other components within normal limits  AMMONIA - Abnormal; Notable for the following:    Ammonia 45 (*)    All other components within normal limits  PROTIME-INR - Abnormal; Notable for the following:    Prothrombin Time 18.9 (*)    INR 1.58 (*)    All other components within normal limits  I-STAT VENOUS BLOOD GAS, ED - Abnormal; Notable for the following:    pH, Ven 7.430 (*)    pCO2, Ven 37.1 (*)    Bicarbonate 24.6 (*)    All other components within normal limits  C DIFFICILE QUICK SCREEN W PCR REFLEX  URINALYSIS, ROUTINE W REFLEX MICROSCOPIC (NOT AT [facility])  I-STAT CG4 LACTIC ACID, ED  I-STAT TROPOININ, ED    Imaging Review Ct Abdomen Pelvis Wo Contrast  09/22/2015  CLINICAL DATA:  Right lower quadrant pain today. EXAM:  CT ABDOMEN AND PELVIS WITHOUT CONTRAST TECHNIQUE: Multidetector CT imaging of the abdomen and pelvis was  performed following the standard protocol without IV contrast. COMPARISON:  CT abdomen and pelvis 08/26/2015 and 05/12/2015. FINDINGS: Marked cardiomegaly is seen as on the prior study. No pleural or pericardial effusion. There is some dependent atelectasis in the lung bases. High attenuation in the gallbladder is new since the most recent examination may be due to the presence of sludge or possibly vicarious excretion of contrast if the patient has had an exam with IV contrast. There is a new small volume of abdominal and pelvic ascites. Colonic wall thickening seen on the prior examination appears worse, particularly in the ascending and transverse colon. The stomach and small bowel are unremarkable. Scattered cysts in the liver noted, unchanged. The spleen, adrenal glands and biliary tree are unremarkable. There is some atrophy of otherwise unremarkable kidneys. There is also some pancreatic atrophy. No focal bony abnormality is identified. IMPRESSION: Since the most recent CT examination. Changes of colitis appear worse, particularly in the ascending and transverse colon. There is also a new small volume of abdominal and pelvic ascites without focal fluid collection. Increased attenuation within the gallbladder may be due to the presence of sludge or could be vicarious excretion of contrast if the patient has had a study with IV contrast. No evidence of cholecystitis is identified. Electronically Signed   By: Inge Rise M.D.   On: 09/22/2015 19:46   I have personally reviewed and evaluated these images and lab results as part of my medical decision-making.   EKG Interpretation None      MDM   Final diagnoses:  RLQ abdominal pain  [name] very friendly 65 year old female presenting here today with altered mental status decreased by mouth intake with family. Home. Patient initially altered with fluids and time patient became more alert. Patient has been having increased diarrhea home,  difficulty taking by mouth. CT scan shows increased colitis. I think patient is failed outpatient treatment of C. difficile and should be admitted.  Patient is amenable to PICC line and treatment at home. However I feel that she's failed oral treatment for C. difficile.  Pierce Biagini Julio Alm, MD 09/22/15 2059  Joseh Sjogren Julio Alm, MD 09/22/15 2059

## 2015-09-23 ENCOUNTER — Encounter: Payer: Self-pay | Admitting: Nurse Practitioner

## 2015-09-23 DIAGNOSIS — N189 Chronic kidney disease, unspecified: Secondary | ICD-10-CM | POA: Diagnosis not present

## 2015-09-23 DIAGNOSIS — E1122 Type 2 diabetes mellitus with diabetic chronic kidney disease: Secondary | ICD-10-CM

## 2015-09-23 DIAGNOSIS — R627 Adult failure to thrive: Secondary | ICD-10-CM

## 2015-09-23 DIAGNOSIS — E43 Unspecified severe protein-calorie malnutrition: Secondary | ICD-10-CM | POA: Diagnosis present

## 2015-09-23 DIAGNOSIS — A047 Enterocolitis due to Clostridium difficile: Secondary | ICD-10-CM

## 2015-09-23 DIAGNOSIS — E1140 Type 2 diabetes mellitus with diabetic neuropathy, unspecified: Secondary | ICD-10-CM

## 2015-09-23 DIAGNOSIS — N179 Acute kidney failure, unspecified: Secondary | ICD-10-CM

## 2015-09-23 DIAGNOSIS — Z79899 Other long term (current) drug therapy: Secondary | ICD-10-CM

## 2015-09-23 DIAGNOSIS — E876 Hypokalemia: Secondary | ICD-10-CM

## 2015-09-23 DIAGNOSIS — D509 Iron deficiency anemia, unspecified: Secondary | ICD-10-CM

## 2015-09-23 DIAGNOSIS — I272 Other secondary pulmonary hypertension: Secondary | ICD-10-CM

## 2015-09-23 LAB — C DIFFICILE QUICK SCREEN W PCR REFLEX
C DIFFICILE (CDIFF) INTERP: DETECTED
C Diff antigen: POSITIVE — AB
C Diff toxin: POSITIVE — AB

## 2015-09-23 LAB — BASIC METABOLIC PANEL
Anion gap: 10 (ref 5–15)
BUN: 43 mg/dL — ABNORMAL HIGH (ref 6–20)
CHLORIDE: 101 mmol/L (ref 101–111)
CO2: 23 mmol/L (ref 22–32)
CREATININE: 1.93 mg/dL — AB (ref 0.44–1.00)
Calcium: 7.9 mg/dL — ABNORMAL LOW (ref 8.9–10.3)
GFR, EST AFRICAN AMERICAN: 31 mL/min — AB (ref >60)
GFR, EST NON AFRICAN AMERICAN: 26 mL/min — AB (ref >60)
Glucose, Bld: 103 mg/dL — ABNORMAL HIGH (ref 65–99)
POTASSIUM: 3.3 mmol/L — AB (ref 3.5–5.1)
SODIUM: 134 mmol/L — AB (ref 135–145)

## 2015-09-23 LAB — CBC
HCT: 24.6 % — ABNORMAL LOW (ref 36.0–46.0)
HEMATOCRIT: 25.8 % — AB (ref 36.0–46.0)
HEMOGLOBIN: 8 g/dL — AB (ref 12.0–15.0)
Hemoglobin: 8.6 g/dL — ABNORMAL LOW (ref 12.0–15.0)
MCH: 24.7 pg — AB (ref 26.0–34.0)
MCH: 25.1 pg — ABNORMAL LOW (ref 26.0–34.0)
MCHC: 32.5 g/dL (ref 30.0–36.0)
MCHC: 33.3 g/dL (ref 30.0–36.0)
MCV: 75.9 fL — AB (ref 78.0–100.0)
MCV: 75.2 fL — ABNORMAL LOW (ref 78.0–100.0)
PLATELETS: 222 10*3/uL (ref 150–400)
Platelets: 183 10*3/uL (ref 150–400)
RBC: 3.24 MIL/uL — ABNORMAL LOW (ref 3.87–5.11)
RBC: 3.43 MIL/uL — ABNORMAL LOW (ref 3.87–5.11)
RDW: 21.2 % — ABNORMAL HIGH (ref 11.5–15.5)
RDW: 21.3 % — AB (ref 11.5–15.5)
WBC: 8.3 10*3/uL (ref 4.0–10.5)
WBC: 9.5 10*3/uL (ref 4.0–10.5)

## 2015-09-23 LAB — CREATININE, SERUM
Creatinine, Ser: 1.95 mg/dL — ABNORMAL HIGH (ref 0.44–1.00)
GFR calc Af Amer: 30 mL/min — ABNORMAL LOW (ref >60)
GFR calc non Af Amer: 26 mL/min — ABNORMAL LOW (ref >60)

## 2015-09-23 LAB — GLUCOSE, CAPILLARY
GLUCOSE-CAPILLARY: 120 mg/dL — AB (ref 65–99)
GLUCOSE-CAPILLARY: 70 mg/dL (ref 65–99)
Glucose-Capillary: 73 mg/dL (ref 65–99)

## 2015-09-23 LAB — MAGNESIUM: Magnesium: 1.2 mg/dL — ABNORMAL LOW (ref 1.7–2.4)

## 2015-09-23 MED ORDER — MAGNESIUM SULFATE 2 GM/50ML IV SOLN
2.0000 g | Freq: Once | INTRAVENOUS | Status: AC
  Administered 2015-09-23: 2 g via INTRAVENOUS
  Filled 2015-09-23: qty 50

## 2015-09-23 MED ORDER — PREGABALIN 75 MG PO CAPS
75.0000 mg | ORAL_CAPSULE | Freq: Three times a day (TID) | ORAL | Status: DC
  Administered 2015-09-23 – 2015-09-24 (×3): 75 mg via ORAL
  Filled 2015-09-23 (×3): qty 1

## 2015-09-23 MED ORDER — PRO-STAT SUGAR FREE PO LIQD
30.0000 mL | Freq: Three times a day (TID) | ORAL | Status: DC
  Administered 2015-09-23 – 2015-09-24 (×3): 30 mL via ORAL
  Filled 2015-09-23 (×3): qty 30

## 2015-09-23 MED ORDER — SODIUM CHLORIDE 0.9 % IV SOLN
INTRAVENOUS | Status: AC
  Administered 2015-09-23: 15:00:00 via INTRAVENOUS

## 2015-09-23 MED ORDER — AMBRISENTAN 5 MG PO TABS
10.0000 mg | ORAL_TABLET | Freq: Every day | ORAL | Status: DC
  Administered 2015-09-23: 10 mg via ORAL
  Filled 2015-09-23 (×2): qty 2

## 2015-09-23 NOTE — Progress Notes (Signed)
Notified in regards to patient GBS results. No new orders placed at this time

## 2015-09-23 NOTE — Care Management Obs Status (Signed)
MEDICARE OBSERVATION STATUS NOTIFICATION   Patient Details  Name: [name] MRN: [id number] Date of Birth: 10-17-1950   Medicare Observation Status Notification Given:  Yes    Carles Collet, RN 09/23/2015, 11:49 AM

## 2015-09-23 NOTE — Care Management Note (Signed)
Case Management Note  Patient Details  Name: [name] MRN: [id number] Date of Birth: [birth year]  Subjective/Objective:                 SPoke with patient in the room. She closed her eyes and would mostly answer questions with yes or no, or sometimes not answer. CM learned that patient lives at home alone, has a walker, and is checked on by sisters- pt would not elaborate to how frequently. CM attempted to explain location of OP Pharmacy for vanc at [place], pt would not open her eyes. CM left map and phone number at the bedside. Records indicate  patient was DC'd to SNF last hospital stay.   Action/Plan:  CM will follow for DC planning. PT eval pending.  Expected Discharge Date:                  Expected Discharge Plan:  [place]  In-House Referral:     Discharge planning Services  CM Consult  Post Acute Care Choice:    Choice offered to:     DME Arranged:    DME Agency:     HH Arranged:    [place] Agency:     Status of Service:  In process, will continue to follow  If discussed at Long Length of Stay Meetings, dates discussed:    Additional Comments:  Carles Collet, RN 09/23/2015, 11:55 AM

## 2015-09-23 NOTE — Progress Notes (Addendum)
Subjective: [name] is feeling "okay" this morning. She seems quite fatigued from her late night admission and says she has no appetite for her breakfast. Her endorses ongoing generalized abdominal pain, distension, and diarrhea. She says she lives at home with her granddaughter.   Objective: Vital signs in last 24 hours: Filed Vitals:   09/22/15 2130 09/22/15 2145 09/22/15 2309 09/23/15 0551  BP: 101/50 100/51 95/45 95/52  Pulse: 66 68 66 67  Temp:   99.5 F (37.5 C) 99.2 F (37.3 C)  TempSrc:      Resp: _0 SpO2: 94% 100% 99% 100%    Intake/Output Summary (Last 24 hours) at 09/23/15 1413 Last data filed at 09/22/15 2049  Gross per 24 hour  Intake      0 ml  Output    300 ml  Net   -300 ml  3x unmeasured UOP, 3x unmeasured BM  Physical Exam General appearance: Tired appearing thin woman sitting in bed, appears stated age, soft spoken Cardiovascular: Regular rate and rhythm, normal S1/S2 Abdomen: Soft, hyperactive bowel sounds, moderately distended, diffusely tender to palpation Skin: Warm, dry, intact Neuro: Cranial nerves grossly intact, alert and oriented Psych: Sleepy but appropriate affect  Labs / Imaging / Procedures:  CBC Latest Ref Rng 09/23/2015 09/22/2015 09/22/2015  WBC 4.0 - 10.5 K/uL 9.5 8.3 10.7(H)  Hemoglobin 12.0 - 15.0 g/dL 8.6(L) 8.0(L) 8.9(L)  Hematocrit 36.0 - 46.0 % 25.8(L) 24.6(L) 26.7(L)  Platelets 150 - 400 K/uL 222 183 185   BMP Latest Ref Rng 09/23/2015 09/22/2015 09/22/2015  Glucose 65 - 99 mg/dL 103(H) - 96  BUN 6 - 20 mg/dL 43(H) - 45(H)  Creatinine 0.44 - 1.00 mg/dL 1.93(H) 1.95(H) 2.29(H)  Sodium 135 - 145 mmol/L 134(L) - 135  Potassium 3.5 - 5.1 mmol/L 3.3(L) - 3.4(L)  Chloride 101 - 111 mmol/L 101 - 100(L)  CO2 22 - 32 mmol/L 23 - 24  Calcium 8.9 - 10.3 mg/dL 7.9(L) - 8.5(L)   C Diff antigen NEGATIVE  POSITIVE (A)   C Diff toxin NEGATIVE  POSITIVE (A)        Magnesium 1.7 - 2.4 mg/dL 1.2 (L)         Assessment/Plan: [name] is a 65 year old woman with PMH AFib, dCHF, pulmonary HTN, scleroderma, iron deficiency anemia from AVMs who presents with ongoing c. Diff colitis, fatigue, and malnutrition/deconditioning after failed outpatient course of Flagyl.   1. C. Diff colitis, ongoing abdominal pain and diarrhea, toxin and antigen present in tested stool  - Po Vancomycin  2. Hypotension, 80s/40s, resolved with mIVF overnight, returned this afternoon, baseline appears to be low (~SBP 90s/100s)  - Continue mIVF (75cc/hr NS) this afternoon/evening given poor po intake and ongoing GI fluid losses  - Hold home diuretics  3. Acute on chronic renal insufficiency, resolving with IVF, Cr 2.29 on admission from 1.64 at previous discharge, down to 1.93 by 7am, likely prerenal due to hypovolemia  - Continue mIVF  4. Electrolyte abnormalities - hypokalemia and hypomagnesemia  - Repleted last night, recheck tomorrow AM  5. Malnutrition/deconditioning, Albumin 2.8  - PT/OT   - PT recs HH PT, supervision for mobility/OOB  - Nutrition consult  6. Iron deficiency anemia, 2/2 AVMs, Hb 8.6, at baseline  - Daily CBC  7. Systemic Sclerosis dCHF Pulm HTN pAFib Hypothyroid Diabetic Neuropathy  - Continue home meds  FEN: Heart health diet, mIVF  DVTppx: Lovenox  Dispo: Anticipated discharge in approximately 1-2 day(s).  Asencion Partridge, MD 09/23/2015, 2:05 PM Pager: [phone]

## 2015-09-23 NOTE — Evaluation (Signed)
Physical Therapy Evaluation Patient Details Name: [name] MRN: [id number] DOB: May 13, 1950 Today's Date: 09/23/2015   History of Present Illness  pt is a 65 y/o female with pmh of Systemic sclerosis, scleroderma,peripheral neuropathy, dm2, PAF admitted with 3 weeks of malaise, fatigue and abdominal pain.  Clinical Impression  Pt admitted with/for malaise, fatigue and abdominal pain.  Pt currently limited functionally due to the problems listed below.  (see problems list.)  Pt will benefit from PT to maximize function and safety to be able to get home safely with available assist of family.     Follow Up Recommendations Home health PT;Supervision - Intermittent;Supervision for mobility/OOB    Equipment Recommendations  None recommended by PT    Recommendations for Other Services       Precautions / Restrictions Precautions Precautions: Fall Restrictions Weight Bearing Restrictions: No      Mobility  Bed Mobility Overal bed mobility: Needs Assistance Bed Mobility: Supine to Sit     Supine to sit: Min assist     General bed mobility comments: cues to keep on task, assist coming up.  Needed lots of time.  Transfers Overall transfer level: Needs assistance Equipment used: Rolling walker (2 wheeled) Transfers: Sit to/from Stand Sit to Stand: Min assist         General transfer comment: assist to come forward and boost.  pt moving very slowly and stiffly  Ambulation/Gait Ambulation/Gait assistance: Min guard Ambulation Distance (Feet): 15 Feet (the 25 from bathroom to recliner with RW) Assistive device: Rolling walker (2 wheeled) Gait Pattern/deviations: Step-through pattern   Gait velocity interpretation: Below normal speed for age/gender General Gait Details: slow with short tentative steps  Stairs            Wheelchair Mobility    Modified Rankin (Stroke Patients Only)       Balance Overall balance assessment: Needs assistance   Sitting  balance-Leahy Scale: Fair     Standing balance support: No upper extremity supported;Bilateral upper extremity supported Standing balance-Leahy Scale: Fair Standing balance comment: cleaned up at sink without use of RW or propping                             Pertinent Vitals/Pain Pain Assessment: Faces Faces Pain Scale: Hurts little more Pain Location: vague joint pain Pain Descriptors / Indicators: Grimacing Pain Intervention(s): Monitored during session;Repositioned    Home Living Family/patient expects to be discharged to:: Private residence Living Arrangements: Other relatives Available Help at Discharge: Family;Available PRN/intermittently Type of Home: Apartment Home Access: Level entry     Home Layout: One level Home Equipment: Walker - 2 wheels;Walker - 4 wheels;Cane - single point;Bedside commode      Prior Function Level of Independence: Needs assistance   Gait / Transfers Assistance Needed: Pt states she has been amb without assistive device           Hand Dominance   Dominant Hand: Right    Extremity/Trunk Assessment   Upper Extremity Assessment: Defer to OT evaluation           Lower Extremity Assessment: Generalized weakness         Communication   Communication: No difficulties  Cognition Arousal/Alertness: Awake/alert Behavior During Therapy: WFL for tasks assessed/performed Overall Cognitive Status: Within Functional Limits for tasks assessed                      General Comments  Exercises        Assessment/Plan    PT Assessment Patient needs continued PT services  PT Diagnosis Difficulty walking;Generalized weakness   PT Problem List Decreased strength;Decreased activity tolerance;Decreased mobility;Decreased knowledge of use of DME;Pain;Decreased balance  PT Treatment Interventions Gait training;Functional mobility training;Therapeutic activities;Balance training;Patient/family education   PT Goals  (Current goals can be found in the Care Plan section) Acute Rehab PT Goals Patient Stated Goal: pt didn't state, mostly too lethargic PT Goal Formulation: With patient Time For Goal Achievement: 10/07/15 Potential to Achieve Goals: Good    Frequency Min 3X/week   Barriers to discharge        Co-evaluation               End of Session   Activity Tolerance: Patient limited by fatigue;Patient tolerated treatment well Patient left: in chair;with call bell/phone within reach;with chair alarm set Nurse Communication: Mobility status    Functional Assessment Tool Used: clinical judgement Functional Limitation: Mobility: Walking and moving around Mobility: Walking and Moving Around Current Status (A5697): At least 1 percent but less than 20 percent impaired, limited or restricted Mobility: Walking and Moving Around Goal Status [phone]): 0 percent impaired, limited or restricted    Time: 1017-1106 PT Time Calculation (min) (ACUTE ONLY): 49 min   Charges:   PT Evaluation $PT Eval Moderate Complexity: 1 Procedure PT Treatments $Gait Training: 8-22 mins $Therapeutic Activity: 8-22 mins   PT G Codes:   PT G-Codes **NOT FOR INPATIENT CLASS** Functional Assessment Tool Used: clinical judgement Functional Limitation: Mobility: Walking and moving around Mobility: Walking and Moving Around Current Status (K5537): At least 1 percent but less than 20 percent impaired, limited or restricted Mobility: Walking and Moving Around Goal Status [phone]): 0 percent impaired, limited or restricted    Tirza Senteno, Tessie Fass 09/23/2015, 11:22 AM 09/23/2015  Donnella Sham, PT [phone]  (pager)

## 2015-09-23 NOTE — Progress Notes (Addendum)
Initial Nutrition Assessment  DOCUMENTATION CODES:   Severe malnutrition in context of acute illness/injury  INTERVENTION:   -Recommend obtaining weight to better assess weight changes -D/c Boost Breeze po TID, each supplement provides 250 kcal and 9 grams of protein -30 ml Prostat TID, each supplement provides 100 kcals and 15 grams protein  NUTRITION DIAGNOSIS:   Malnutrition related to acute illness as evidenced by moderate depletion of body fat, moderate depletions of muscle mass, energy intake < or equal to 50% for > or equal to 5 days.  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  Pt admitted with concern for sepsis vs c-diff.   Pt was sitting in recliner chair, unable to provide much hx due to lethargy. Spoke mainly to pt's sister at bedside. She reports appetite was good up until one month ago, when she would not want anything to eat. Per pt sister, pt is very selective about foods and will generally take in a few bites and sips at meals. Noted lunch tray- pt consumed one bite of chicken and dumplings.   Per pt sister, pt does not like nutritional supplements. She did not drink either Ensure or Boost Breeze provided to her. Pt is also concerned about consuming sweet liquids to ensure good glycemic control. Pt sister is amenable to trying prostat- RD to order.   RD suspects wt loss, however, sister unable to confirm amount.   Nutrition-Focused physical exam completed. Findings are mild to moderate fat depletion, mild to  moderate muscle depletion, and no edema.   Labs reviewed: Na: (on IV supplementation), K: 3.3, CBGS: 120.   Diet Order:  Diet Heart Room service appropriate?: Yes; Fluid consistency:: Thin  Skin:  Reviewed, no issues  Last BM:  09/22/15  Height:   Ht Readings from Last 1 Encounters:  09/17/15 5' 5" (1.651 m)    Weight:   Wt Readings from Last 1 Encounters:  09/17/15 157 lb 9.6 oz (71.487 kg)    Ideal Body Weight:  56.8 kg  BMI:  Estimated body mass index is 26.23 kg/(m^2) as calculated from the following:   Height as of 09/17/15: 5' 5" (1.651 m).   Weight as of 09/17/15: 157 lb 9.6 oz (71.487 kg).  Estimated Nutritional Needs:   Kcal:  1600-1800  Protein:  70-85 grams  Fluid:  1.6-1.8 L  EDUCATION NEEDS:   Education needs addressed  Hazyl Marseille A. Jimmye Norman, RD, LDN, CDE Pager: [phone] After hours Pager: [phone]

## 2015-09-23 NOTE — Progress Notes (Signed)
Pt's BP's trending low. Wynetta Emery, MD notified of pt's blood pressures. No new orders at this time. MD reported to continue to monitor since pt has hx of HF and will increase fluids if BP's continue to run low.

## 2015-09-24 ENCOUNTER — Ambulatory Visit: Payer: Medicare Other

## 2015-09-24 DIAGNOSIS — A047 Enterocolitis due to Clostridium difficile: Secondary | ICD-10-CM | POA: Diagnosis not present

## 2015-09-24 LAB — BASIC METABOLIC PANEL
ANION GAP: 9 (ref 5–15)
BUN: 44 mg/dL — ABNORMAL HIGH (ref 6–20)
CALCIUM: 8.3 mg/dL — AB (ref 8.9–10.3)
CO2: 22 mmol/L (ref 22–32)
Chloride: 102 mmol/L (ref 101–111)
Creatinine, Ser: 1.74 mg/dL — ABNORMAL HIGH (ref 0.44–1.00)
GFR calc Af Amer: 35 mL/min — ABNORMAL LOW (ref >60)
GFR, EST NON AFRICAN AMERICAN: 30 mL/min — AB (ref >60)
GLUCOSE: 80 mg/dL (ref 65–99)
Potassium: 3.2 mmol/L — ABNORMAL LOW (ref 3.5–5.1)
Sodium: 133 mmol/L — ABNORMAL LOW (ref 135–145)

## 2015-09-24 LAB — CBC
HCT: 27.6 % — ABNORMAL LOW (ref 36.0–46.0)
Hemoglobin: 9.2 g/dL — ABNORMAL LOW (ref 12.0–15.0)
MCH: 25 pg — ABNORMAL LOW (ref 26.0–34.0)
MCHC: 33.3 g/dL (ref 30.0–36.0)
MCV: 75 fL — AB (ref 78.0–100.0)
PLATELETS: 250 10*3/uL (ref 150–400)
RBC: 3.68 MIL/uL — ABNORMAL LOW (ref 3.87–5.11)
RDW: 21.1 % — ABNORMAL HIGH (ref 11.5–15.5)
WBC: 7.1 10*3/uL (ref 4.0–10.5)

## 2015-09-24 LAB — MAGNESIUM: Magnesium: 1.7 mg/dL (ref 1.7–2.4)

## 2015-09-24 MED ORDER — PRO-STAT SUGAR FREE PO LIQD: 30.0000 mL | Freq: Three times a day (TID) | ORAL | Status: AC

## 2015-09-24 MED ORDER — AMBRISENTAN 5 MG PO TABS
10.0000 mg | ORAL_TABLET | Freq: Every day | ORAL | Status: DC
  Administered 2015-09-24: 10 mg via ORAL
  Filled 2015-09-24: qty 2

## 2015-09-24 MED ORDER — VANCOMYCIN 50 MG/ML ORAL SOLUTION
125.0000 mg | Freq: Four times a day (QID) | ORAL | Status: AC
Start: 2015-09-24 — End: 2015-10-07

## 2015-09-24 MED ORDER — POTASSIUM CHLORIDE CRYS ER 20 MEQ PO TBCR
40.0000 meq | EXTENDED_RELEASE_TABLET | Freq: Two times a day (BID) | ORAL | Status: DC
  Administered 2015-09-24: 40 meq via ORAL
  Filled 2015-09-24: qty 2

## 2015-09-24 MED ORDER — TORSEMIDE 20 MG PO TABS: 60.0000 mg | ORAL_TABLET | Freq: Every day | ORAL | Status: DC

## 2015-09-24 MED FILL — VANCOMYCIN HCL 125 MG CAP: 125 | 12 days supply | Qty: 49 | Fill #0

## 2015-09-24 NOTE — Progress Notes (Addendum)
Subjective: Ms. Babino is feeling a little better today. She was up walking with OT this morning and did quite well. She says her stomach is still mildly tender but that her diarrhea is noticeably better. She had 2x episodes of diarrhea by late morning but slept throughout the night without having diarrhea - which is an improvement from previous. Her appetite remains poor but she managed to eat pancakes, sausage, apple juice for breakfast. She feels good about the possibility of going home later today.  Objective: Vital signs in last 24 hours: Filed Vitals:   09/23/15 1625 09/23/15 2241 09/24/15 0531 09/24/15 1510  BP: _0 91/46  Pulse: 66 64 66 64  Temp:  98.7 F (37.1 C) 97.9 F (36.6 C) 98.9 F (37.2 C)  TempSrc:   Axillary   Resp:   18 18  SpO2:  100% 100% 98%    Intake/Output Summary (Last 24 hours) at 09/24/15 1711 Last data filed at 09/24/15 1518  Gross per 24 hour  Intake   1660 ml  Output    750 ml  Net    910 ml   Physical Exam General appearance: Tired appearing thin woman sitting in bed, appears stated age, soft spoken Cardiovascular: Regular rate and rhythm, normal S1/S2 Abdomen: Soft, hyperactive bowel sounds, moderately distended, diffusely tender to palpation Skin: Warm, dry, intact Neuro: Cranial nerves grossly intact, alert and oriented Psych: Sleepy but appropriate affect  Labs / Imaging / Procedures:  CBC Latest Ref Rng 09/24/2015 09/23/2015 09/22/2015  WBC 4.0 - 10.5 K/uL 7.1 9.5 8.3  Hemoglobin 12.0 - 15.0 g/dL 9.2(L) 8.6(L) 8.0(L)  Hematocrit 36.0 - 46.0 % 27.6(L) 25.8(L) 24.6(L)  Platelets 150 - 400 K/uL 250 222 183   BMP Latest Ref Rng 09/24/2015 09/23/2015 09/22/2015  Glucose 65 - 99 mg/dL 80 103(H) -  BUN 6 - 20 mg/dL 44(H) 43(H) -  Creatinine 0.44 - 1.00 mg/dL 1.74(H) 1.93(H) 1.95(H)  Sodium 135 - 145 mmol/L 133(L) 134(L) -  Potassium 3.5 - 5.1 mmol/L 3.2(L) 3.3(L) -  Chloride 101 - 111 mmol/L 102 101 -  CO2 22 - 32 mmol/L 22 23  -  Calcium 8.9 - 10.3 mg/dL 8.3(L) 7.9(L) -   C Diff antigen NEGATIVE  POSITIVE (A)   C Diff toxin NEGATIVE  POSITIVE (A)        Magnesium 1.7 - 2.4 mg/dL 1.7        Assessment/Plan: Ms. Lina is a 65 year old woman with PMH AFib, dCHF, pulmonary HTN, scleroderma, iron deficiency anemia from AVMs who presents with ongoing c. Diff colitis, fatigue, and malnutrition/deconditioning after failed outpatient course of Flagyl.   1. C. Diff colitis, ongoing abdominal pain and diarrhea, toxin and antigen present in tested stool  - Po Vancomycin QID for 14 days, will end on 10/07/15  2. Hypotension, 80s/40s, resolved with mIVF overnight, baseline appears to be low (~SBP 90s/100s)  - At baseline, appeared to be taking 2x prescribed dose of home diuretics on admission, corrected for discharge instruction today  - Encourage po intake at home  3. Acute on chronic renal insufficiency, resolving  - Continue to encourage po intake  4. Electrolyte abnormalities - hypokalemia and hypomagnesemia  - Repleted yesterday, takes po KCl at home  5. Malnutrition/deconditioning, Albumin 2.8  - PT/OT   - PT recs HH PT, supervision for mobility/OOB   - OT recs HH OT, 24/7 supervision  - Nutrition consult rec Pro-stat supplement  6. Iron deficiency anemia, 2/2 AVMs,  Hb 9.1, at baseline  7. Systemic Sclerosis dCHF Pulm HTN pAFib Hypothyroid Diabetic Neuropathy  - Continue home meds  FEN: Heart health diet  DVTppx: Lovenox  Dispo: Anticipated discharge today.     Asencion Partridge, MD 09/24/2015, 5:11 PM Pager: [phone]

## 2015-09-24 NOTE — Progress Notes (Signed)
  Date: 09/24/2015  Patient name: [name]  Medical record number: [id number]  Date of birth: Jul 11, 1950   This patient has been seen and the plan of care was discussed with the house staff. Please see their note for complete details. I concur with their findings with the following additions/corrections: Ms Aure had just completed OT session. Sitting in chair, more interactive today. States 2 stool this AM, none overnight. Eat a good breakfast, with solid food intake. Stable for d/C home to complete oral vanc.  Bartholomew Crews, MD 09/24/2015, 2:45 PM

## 2015-09-24 NOTE — Progress Notes (Signed)
Pt discharge education and instructions completed with pt and family at bedside; all voices understanding and denies any questions. Pt IV removed; pt handed her prescription for vancomycin and her home medications picked up from pharmacy and handed to pt daughter at bedside. Pt discharge home with daughters to transport her home. Pt transported off unit via wheelchair with family and belongings to the side. Delia Heady RN

## 2015-09-24 NOTE — Discharge Summary (Signed)
Name: [name] MRN: [id number] DOB: October 08, 1950 65 y.o. PCP: Pamela Falcon, MD  Date of Admission: 09/22/2015  4:01 PM Date of Discharge: 09/24/2015 Attending Physician: No att. providers found  Discharge Diagnosis: 1. Clostridium difficile colitis  Active Problems:   Pulmonary hypertension (HCC)   Chronic diastolic CHF (congestive heart failure) (HCC)   Colitis   Anemia, iron deficiency   Protein-calorie malnutrition, severe   Discharge Medications:   Medication List    TAKE these medications        ADEMPAS 2.5 MG Tabs  Generic drug:  Riociguat  Take by mouth.     ambrisentan 10 MG tablet  Commonly known as:  LETAIRIS  Take 1 tablet (10 mg total) by mouth daily.     amiodarone 200 MG tablet  Commonly known as:  PACERONE  Take 1 tablet (200 mg total) by mouth daily.     esomeprazole 40 MG capsule  Commonly known as:  NEXIUM  Take 40 mg by mouth daily at 12 noon.     feeding supplement (PRO-STAT SUGAR FREE 64) Liqd  Take 30 mLs by mouth 3 (three) times daily between meals.     feeding supplement Liqd  Take 1 Container by mouth 3 (three) times daily between meals.     fenofibrate 54 MG tablet  Take 1 tablet (54 mg total) by mouth daily.     FLUoxetine 20 MG capsule  Commonly known as:  PROZAC  Take 1 capsule (20 mg total) by mouth every morning.     HYDROcodone-acetaminophen 10-325 MG tablet  Commonly known as:  NORCO  Take 1 tablet by mouth every 6 (six) hours. pain     hydrOXYzine 10 MG tablet  Commonly known as:  ATARAX/VISTARIL  Take 1 tablet (10 mg total) by mouth 2 (two) times daily as needed for itching.     levothyroxine 25 MCG tablet  Commonly known as:  SYNTHROID, LEVOTHROID  Take 1 tablet (25 mcg total) by mouth daily before breakfast.     metolazone 2.5 MG tablet  Commonly known as:  ZAROXOLYN  Take 2.5 mg by mouth 2 (two) times a week. Tuesdays and Fridays     nortriptyline 10 MG capsule  Commonly known as:  PAMELOR  Take 2  capsules (20 mg total) by mouth at bedtime.     potassium chloride SA 20 MEQ tablet  Commonly known as:  K-DUR,KLOR-CON  Take 20 mEq by mouth daily. Take an additional 20 mEq when taking Metolazone     pregabalin 150 MG capsule  Commonly known as:  LYRICA  Take 1 capsule (150 mg total) by mouth 3 (three) times daily.     Selexipag 1600 MCG Tabs  Commonly known as:  UPTRAVI  Take 1,600 mcg by mouth 2 (two) times daily.     torsemide 20 MG tablet  Commonly known as:  DEMADEX  Take 3 tablets (60 mg total) by mouth daily.     vancomycin 50 mg/mL oral solution  Commonly known as:  VANCOCIN  Take 2.5 mLs (125 mg total) by mouth every 6 (six) hours.        Disposition and follow-up:   [name] was discharged from [facility] in Stable condition.  At the hospital follow up visit please address:  1.  Resolution of c. Diff colitis - diarrhea, abdominal pain, distension, failed outpatient Flagyl regimen, attempting 14d course of po Vancomycin  - Hypovolemia, SBP typically in 90-100s/50s, low on admission  due to poor po intake and diarrhea, resolved by discharge but poor appetite may persist  *Patient was also taking Torsemide 66m BID, but was prescribed 653mQD, unclear if patient misunderstanding/overdiuresed or if true Rx change - she was discharged euvolemic on 6076mD per previous Rx  - Deconditioning/malnutrition - HH PT/OT ordered and given rx for pro-stat nutrition supplements  - Hypokalemia, takes supplements at home yet required repletion during hospital stay, hypomagnesemia also present inititally   - Iron deficiency anemia  2.  Labs / imaging needed at time of follow-up: CBC, BMP, Mg  3.  Pending labs/ test needing follow-up: None  Follow-up Appointments:     Follow-up Information    Follow [facility] with [facility]:  nurse and physical therapy to cal and set up appointment in next 1-2 days.   Contact information:   [address] [place] [phone]    Follow up with [facility]   Specialty:  Internal Medicine   Why:  Schedulers will call to make a hospital follow up appointment in 2 weeks. Please call to schedule this if you do not hear from us.[place] Contact information:   [address] [phone]       Hospital Course by problem list:  1. C. Diff Colitis - admitted overnight on 7/10-11/17, had been discharged previously on 7/17 with 10d po Flagyl and she finished this course with no resolution of her diarrhea, abdominal pain, anorexia, which gradually worsened prompting this presentation. C diff antigen and toxin were again positive and po Vancomycin QID begun. CT Abd/Pelv revealed worsening colitis in ascending and transverse colon compared to previous admission.  2. Hypotension/hypovolemia - present on admission to 80s/40s, due to poor po intake and ongoing diarrhea, improved to baseline with maintenance IVF and po intake improved by the time of discharge  3. Acute on chronic renal insufficiency - Cr up to 2.29 on admission, likely pre-renal etiology, improved to near-baseline 1.74 with IVF and increased po intake  4. Electrolyte abnormalities - found to be hypomagnesemic and hypokalemia on admission, both repleted during hospital stay, discharge on home med supplemental potassium  5. Deconditioning/malnutrition - poor po intake and lack of appetite secondary to ongoing c.diff and other medical conditions (scleroderma, etc), Albumin 2.8, nutrition recommended Pro-stat supplement TID, and PT/OT: home health PTOT  Continued home meds for other chronic conditions throughout hospital stay.  Discharge Vitals:   BP 91/46 mmHg  Pulse 64  Temp(Src) 98.9 F (37.2 C) (Axillary)  Resp 18  SpO2 98%  Pertinent Labs, Studies, and Procedures:   CBC Latest Ref Rng 09/24/2015 09/23/2015 09/22/2015  WBC 4.0 - 10.5 K/uL 7.1 9.5 8.3  Hemoglobin 12.0 - 15.0  g/dL 9.2(L) 8.6(L) 8.0(L)  Hematocrit 36.0 - 46.0 % 27.6(L) 25.8(L) 24.6(L)  Platelets 150 - 400 K/uL 250 222 183   BMP Latest Ref Rng 09/24/2015 09/23/2015 09/22/2015  Glucose 65 - 99 mg/dL 80 103(H) -  BUN 6 - 20 mg/dL 44(H) 43(H) -  Creatinine 0.44 - 1.00 mg/dL 1.74(H) 1.93(H) 1.95(H)  Sodium 135 - 145 mmol/L 133(L) 134(L) -  Potassium 3.5 - 5.1 mmol/L 3.2(L) 3.3(L) -  Chloride 101 - 111 mmol/L 102 101 -  CO2 22 - 32 mmol/L 22 23 -  Calcium 8.9 - 10.3 mg/dL 8.3(L) 7.9(L) -   Mg 1.2 (L) on 09/22/15 corrected to 1.7 (lower limit of nml) by 09/24/15  Ref Range 1d ago  3wk ago     C Diff antigen NEGATIVE  POSITIVE (A) POSITIVE (A)    C Diff toxin NEGATIVE  POSITIVE (A) POSITIVE (A)        CT ABDOMEN AND PELVIS WITHOUT CONTRAST - 09/22/2015  IMPRESSION: Since the most recent CT examination. Changes of colitis appear worse, particularly in the ascending and transverse colon. There is also a new small volume of abdominal and pelvic ascites without focal fluid collection.  Increased attenuation within the gallbladder may be due to the presence of sludge or could be vicarious excretion of contrast if the patient has had a study with IV contrast. No evidence of cholecystitis is identified.  Discharge Instructions: Discharge Instructions    Call MD for:  difficulty breathing, headache or visual disturbances    Complete by:  As directed      Call MD for:  extreme fatigue    Complete by:  As directed      Call MD for:  persistant dizziness or light-headedness    Complete by:  As directed      Call MD for:  persistant nausea and vomiting    Complete by:  As directed      Call MD for:  severe uncontrolled pain    Complete by:  As directed      Diet - low sodium heart healthy    Complete by:  As directed      Discharge instructions    Complete by:  As directed   Please take your medications as prescribed. Notably - please take your Vancomycin every 6 hours (this may require  setting an alarm to wake up) until 10/07/15. This will help clear your C. Diff infection. Please make every attempt to eat and drink throughout the day, as you have a tendency to become very dehydrated and undernourished. Call us or visit the ED if your abdominal pain suddenly worsens, if you develop fevers, or worsening diarrhea, nausea, vomiting, or any other concerning symptom. Call us if symptoms do not seem to be improving in 5-7 days.     Increase activity slowly    Complete by:  As directed            Signed: Asencion Partridge, MD 09/24/2015, 8:45 PM   Pager: [phone]

## 2015-09-24 NOTE — Evaluation (Signed)
Occupational Therapy Evaluation Patient Details Name: JAQUANA GEIGER MRN: [id number] DOB: 11/29/1950 Today's Date: 09/24/2015    History of Present Illness pt is a 65 y/o female with pmh of Systemic sclerosis, scleroderma,peripheral neuropathy, dm2, PAF admitted with 3 weeks of malaise, fatigue and abdominal pain.   Clinical Impression   This 65 yo female admitted with above presents to acute OT with deficits below (see OT problem list) thus affecting her PLOF at home of Mod I per her report for basic ADLs. She will benefit from acute OT with follow up [place] to get back to PLOF.    Follow Up Recommendations  Home health OT;Supervision/Assistance - 24 hour    Equipment Recommendations  None recommended by OT       Precautions / Restrictions Precautions Precautions: Fall Restrictions Weight Bearing Restrictions: No      Mobility Bed Mobility Overal bed mobility: Needs Assistance Bed Mobility: Supine to Sit     Supine to sit: Min guard;HOB elevated (and using rail)        Transfers Overall transfer level: Needs assistance Equipment used: Rolling walker (2 wheeled) Transfers: Sit to/from Stand Sit to Stand: Min guard         General transfer comment: Pt moves very slowly    Balance Overall balance assessment: Needs assistance Sitting-balance support: No upper extremity supported;Feet supported Sitting balance-Leahy Scale: Fair     Standing balance support: Bilateral upper extremity supported;During functional activity Standing balance-Leahy Scale: Poor Standing balance comment: reliant on RW or leaning up against sink while she washed her hands                            ADL Overall ADL's : Needs assistance/impaired Eating/Feeding: Independent;Sitting   Grooming: Wash/dry hands;Min guard;Standing   Upper Body Bathing: Set up;Supervision/ safety;Sitting   Lower Body Bathing: Min guard;Sit to/from stand   Upper Body Dressing :  Supervision/safety;Set up;Sitting   Lower Body Dressing: Min guard;Sit to/from stand   Toilet Transfer: Min guard;Ambulation;RW;Regular Toilet;Grab bars   Toileting- Clothing Manipulation and Hygiene: Min guard;Sit to/from stand                         Pertinent Vitals/Pain Pain Assessment: No/denies pain     Hand Dominance Right   Extremity/Trunk Assessment Upper Extremity Assessment Upper Extremity Assessment: Generalized weakness           Communication Communication Communication: No difficulties   Cognition Arousal/Alertness: Awake/alert Behavior During Therapy: WFL for tasks assessed/performed Overall Cognitive Status: Within Functional Limits for tasks assessed (answers are very slow in coming)                                Home Living Family/patient expects to be discharged to:: Private residence Living Arrangements: Other relatives Available Help at Discharge: Family;Available PRN/intermittently (grand-daughter at night, mainly alone during day--) Type of Home: Apartment Home Access: Level entry     Home Layout: One level     Bathroom Shower/Tub: Tub/shower unit;Curtain Shower/tub characteristics: Architectural technologist: Handicapped height     Home Equipment: Environmental consultant - 2 wheels;Walker - 4 wheels;Cane - single point;Bedside commode;Grab bars - tub/shower          Prior Functioning/Environment Level of Independence: Needs assistance  Gait / Transfers Assistance Needed: Pt states she has been amb without assistive device ADL's / Homemaking Assistance Needed:  pt reports she bathed and dressed herself prior to admit.         OT Diagnosis: Generalized weakness   OT Problem List: Decreased strength;Impaired balance (sitting and/or standing)   OT Treatment/Interventions: Self-care/ADL training;Patient/family education;Balance training;Therapeutic activities;DME and/or AE instruction    OT Goals(Current goals can be found in the  care plan section) Acute Rehab OT Goals Patient Stated Goal: would like to go home today OT Goal Formulation: With patient Time For Goal Achievement: 10/08/15 Potential to Achieve Goals: Good  OT Frequency: Min 2X/week   Barriers to D/C: Decreased caregiver support             End of Session Equipment Utilized During Treatment: Gait belt;Rolling walker  Activity Tolerance: Patient tolerated treatment well (just moves slowly\) Patient left: in chair;with call bell/phone within reach;with chair alarm set   Time: 5872-7618 OT Time Calculation (min): 25 min Charges:  OT General Charges $OT Visit: 1 Procedure OT Evaluation $OT Eval Moderate Complexity: 1 Procedure OT Treatments $Self Care/Home Management : 8-22 mins G-Codes: OT G-codes **NOT FOR INPATIENT CLASS** Functional Assessment Tool Used: clinical observation Functional Limitation: Self care Self Care Current Status (M8592): At least 1 percent but less than 20 percent impaired, limited or restricted Self Care Goal Status (N6394): At least 1 percent but less than 20 percent impaired, limited or restricted  Almon Register [phone] 09/24/2015, 11:06 AM

## 2015-09-24 NOTE — Care Management Note (Signed)
Case Management Note  Patient Details  Name: [name] MRN: [id number] Date of Birth: Jun 12, 1950  Subjective/Objective:                 Patient to DC today, spoke with patient and family at the bedside, they agreed to [facility] PT and RN, would like to use [facility], referral made. Vanc capsules $3.30 at outpatient pharmacy, , MD electronically sending over Rx and family member will pick up before pharmacy closes, map provided.  Action/Plan:  DC to home w/ HH. Expected Discharge Date:                  Expected Discharge Plan:  [place]  In-House Referral:     Discharge planning Services  CM Consult  Post Acute Care Choice:  Home Health Choice offered to:  Patient  DME Arranged:    DME Agency:     HH Arranged:  RN, PT [place] Agency:  [place]  Status of Service:  Completed, signed off  If discussed at [place] of Stay Meetings, dates discussed:    Additional Comments:  Carles Collet, RN 09/24/2015, 3:29 PM

## 2015-09-27 DIAGNOSIS — D509 Iron deficiency anemia, unspecified: Secondary | ICD-10-CM | POA: Diagnosis not present

## 2015-09-27 DIAGNOSIS — I5032 Chronic diastolic (congestive) heart failure: Secondary | ICD-10-CM | POA: Diagnosis not present

## 2015-09-27 DIAGNOSIS — I272 Other secondary pulmonary hypertension: Secondary | ICD-10-CM | POA: Diagnosis not present

## 2015-09-27 DIAGNOSIS — E1140 Type 2 diabetes mellitus with diabetic neuropathy, unspecified: Secondary | ICD-10-CM | POA: Diagnosis not present

## 2015-09-27 DIAGNOSIS — I480 Paroxysmal atrial fibrillation: Secondary | ICD-10-CM | POA: Diagnosis not present

## 2015-09-27 DIAGNOSIS — N189 Chronic kidney disease, unspecified: Secondary | ICD-10-CM | POA: Diagnosis not present

## 2015-09-27 DIAGNOSIS — E1122 Type 2 diabetes mellitus with diabetic chronic kidney disease: Secondary | ICD-10-CM | POA: Diagnosis not present

## 2015-09-27 DIAGNOSIS — M349 Systemic sclerosis, unspecified: Secondary | ICD-10-CM | POA: Diagnosis not present

## 2015-09-27 DIAGNOSIS — I130 Hypertensive heart and chronic kidney disease with heart failure and stage 1 through stage 4 chronic kidney disease, or unspecified chronic kidney disease: Secondary | ICD-10-CM | POA: Diagnosis not present

## 2015-09-27 DIAGNOSIS — A047 Enterocolitis due to Clostridium difficile: Secondary | ICD-10-CM | POA: Diagnosis not present

## 2015-09-27 DIAGNOSIS — E039 Hypothyroidism, unspecified: Secondary | ICD-10-CM | POA: Diagnosis not present

## 2015-09-30 ENCOUNTER — Telehealth (HOSPITAL_COMMUNITY): Payer: Self-pay | Admitting: Cardiology

## 2015-09-30 DIAGNOSIS — M349 Systemic sclerosis, unspecified: Secondary | ICD-10-CM | POA: Diagnosis not present

## 2015-09-30 DIAGNOSIS — E1122 Type 2 diabetes mellitus with diabetic chronic kidney disease: Secondary | ICD-10-CM | POA: Diagnosis not present

## 2015-09-30 DIAGNOSIS — I130 Hypertensive heart and chronic kidney disease with heart failure and stage 1 through stage 4 chronic kidney disease, or unspecified chronic kidney disease: Secondary | ICD-10-CM | POA: Diagnosis not present

## 2015-09-30 DIAGNOSIS — I272 Other secondary pulmonary hypertension: Secondary | ICD-10-CM | POA: Diagnosis not present

## 2015-09-30 DIAGNOSIS — E039 Hypothyroidism, unspecified: Secondary | ICD-10-CM | POA: Diagnosis not present

## 2015-09-30 DIAGNOSIS — E1140 Type 2 diabetes mellitus with diabetic neuropathy, unspecified: Secondary | ICD-10-CM | POA: Diagnosis not present

## 2015-09-30 DIAGNOSIS — N189 Chronic kidney disease, unspecified: Secondary | ICD-10-CM | POA: Diagnosis not present

## 2015-09-30 DIAGNOSIS — A047 Enterocolitis due to Clostridium difficile: Secondary | ICD-10-CM | POA: Diagnosis not present

## 2015-09-30 DIAGNOSIS — I480 Paroxysmal atrial fibrillation: Secondary | ICD-10-CM | POA: Diagnosis not present

## 2015-09-30 DIAGNOSIS — D509 Iron deficiency anemia, unspecified: Secondary | ICD-10-CM | POA: Diagnosis not present

## 2015-09-30 DIAGNOSIS — I5032 Chronic diastolic (congestive) heart failure: Secondary | ICD-10-CM | POA: Diagnosis not present

## 2015-09-30 NOTE — Telephone Encounter (Signed)
Today shouldve have been a metolazone day.  Should check back towards end of week.   OK to take an extra metolazone (up to three times weekly) per Dr. Aundra Dubin   Thanks.   Legrand Como [address]" [place], [place] 09/30/2015 4:33 PM

## 2015-09-30 NOTE — Telephone Encounter (Signed)
Patient reports she was told to discontinue metolazone while in hospital Advised the CHF providers have ordered her to take prn weight gain/SOB/swelling up to 3 times per week Patient voiced understanding and requests a follow up (next available 8/2)

## 2015-09-30 NOTE — Telephone Encounter (Signed)
Patient has reached max dose of adempas 2.5 mg daily During patients home visit [place] reports stomach is full/ascites and BLE+1-2 bp 93/37 Patient reports she has taken all of her medications today   Please advise further if needed

## 2015-10-01 ENCOUNTER — Telehealth (HOSPITAL_COMMUNITY): Payer: Self-pay | Admitting: *Deleted

## 2015-10-01 MED ORDER — TORSEMIDE 20 MG PO TABS: 60.0000 mg | ORAL_TABLET | Freq: Two times a day (BID) | ORAL | Status: DC

## 2015-10-01 NOTE — Telephone Encounter (Signed)
She had been on torsemide 60 mg bid and metolazone twice a week when she saw me last and was doing well on that dose.  Would have her take metolazone 2.5 x 1 today and take torsemide 60 mg bid.  Should go back to metolazone twice a week.  Needs BMET here on Friday, probably should get her in for followup with me or Jonni Sanger on Friday as well.  If she feels worse in the interim, should go to ER.

## 2015-10-01 NOTE — Telephone Encounter (Signed)
Spoke w/Tammy regarding Dr Claris Gladden recommendations, she will advise pt on med changes, appt sch for Fri 1:40 with Dr Aundra Dubin, she will notify pt, if she can not get here then she will c/b to resch.

## 2015-10-01 NOTE — Telephone Encounter (Signed)
Tammy called concerned about pt.  She states she saw pt yesterday and her wt was 151.6 lb, pt has not been weighing since she was d/c'd from hosp but before hospitalization she weighed 144 lb.  She reports pt is still very weak and is being treated for c-diff.  Pt has edema up to her thighs, abd is tight and distended, lungs with fine crackles in bases, pt did not seem to be acutely SOB.  BP 96/48, HR 80, O2 sat 94% on RA.  She states pt reported to her Metolazone was d/c'd and she was prescribed Tor 60 mg daily, however pt states to her she had been taken Tor 60 mg BID for past 3 days to help with fluid.  She sees a note from our office in epic yesterday recommending pt could take extra Metolazone as needed so she believes she took that yesterday.  Tammy states she did a bmet yesterday, results are: K 4.3 bun 55 Cr 2.29 which is up from hosp d/c.  Will send all info to Dr Aundra Dubin for review and recommendations.

## 2015-10-03 ENCOUNTER — Ambulatory Visit (HOSPITAL_COMMUNITY)
Admission: RE | Admit: 2015-10-03 | Discharge: 2015-10-03 | Disposition: A | Discharge status: Home / Self Care | Payer: Medicare Other | Source: Ambulatory Visit | Attending: Cardiology | Admitting: Cardiology

## 2015-10-03 VITALS — BP 100/58 | HR 65 | Wt 152.8 lb

## 2015-10-03 DIAGNOSIS — I272 Other secondary pulmonary hypertension: Secondary | ICD-10-CM | POA: Insufficient documentation

## 2015-10-03 DIAGNOSIS — M349 Systemic sclerosis, unspecified: Secondary | ICD-10-CM | POA: Insufficient documentation

## 2015-10-03 DIAGNOSIS — I5032 Chronic diastolic (congestive) heart failure: Secondary | ICD-10-CM | POA: Insufficient documentation

## 2015-10-03 DIAGNOSIS — E785 Hyperlipidemia, unspecified: Secondary | ICD-10-CM | POA: Diagnosis not present

## 2015-10-03 DIAGNOSIS — D573 Sickle-cell trait: Secondary | ICD-10-CM | POA: Diagnosis not present

## 2015-10-03 DIAGNOSIS — I2781 Cor pulmonale (chronic): Secondary | ICD-10-CM | POA: Diagnosis not present

## 2015-10-03 DIAGNOSIS — I313 Pericardial effusion (noninflammatory): Secondary | ICD-10-CM | POA: Diagnosis not present

## 2015-10-03 DIAGNOSIS — E039 Hypothyroidism, unspecified: Secondary | ICD-10-CM | POA: Insufficient documentation

## 2015-10-03 DIAGNOSIS — E1140 Type 2 diabetes mellitus with diabetic neuropathy, unspecified: Secondary | ICD-10-CM | POA: Diagnosis not present

## 2015-10-03 DIAGNOSIS — E1143 Type 2 diabetes mellitus with diabetic autonomic (poly)neuropathy: Secondary | ICD-10-CM | POA: Insufficient documentation

## 2015-10-03 DIAGNOSIS — N183 Chronic kidney disease, stage 3 unspecified: Secondary | ICD-10-CM

## 2015-10-03 DIAGNOSIS — M1990 Unspecified osteoarthritis, unspecified site: Secondary | ICD-10-CM | POA: Insufficient documentation

## 2015-10-03 DIAGNOSIS — N189 Chronic kidney disease, unspecified: Secondary | ICD-10-CM | POA: Diagnosis not present

## 2015-10-03 DIAGNOSIS — G629 Polyneuropathy, unspecified: Secondary | ICD-10-CM | POA: Diagnosis not present

## 2015-10-03 DIAGNOSIS — E669 Obesity, unspecified: Secondary | ICD-10-CM | POA: Diagnosis not present

## 2015-10-03 DIAGNOSIS — K3184 Gastroparesis: Secondary | ICD-10-CM | POA: Insufficient documentation

## 2015-10-03 DIAGNOSIS — I480 Paroxysmal atrial fibrillation: Secondary | ICD-10-CM | POA: Diagnosis not present

## 2015-10-03 DIAGNOSIS — I130 Hypertensive heart and chronic kidney disease with heart failure and stage 1 through stage 4 chronic kidney disease, or unspecified chronic kidney disease: Secondary | ICD-10-CM | POA: Diagnosis not present

## 2015-10-03 DIAGNOSIS — I440 Atrioventricular block, first degree: Secondary | ICD-10-CM | POA: Diagnosis not present

## 2015-10-03 DIAGNOSIS — M109 Gout, unspecified: Secondary | ICD-10-CM | POA: Insufficient documentation

## 2015-10-03 DIAGNOSIS — K219 Gastro-esophageal reflux disease without esophagitis: Secondary | ICD-10-CM | POA: Insufficient documentation

## 2015-10-03 DIAGNOSIS — D509 Iron deficiency anemia, unspecified: Secondary | ICD-10-CM | POA: Insufficient documentation

## 2015-10-03 DIAGNOSIS — E1122 Type 2 diabetes mellitus with diabetic chronic kidney disease: Secondary | ICD-10-CM | POA: Insufficient documentation

## 2015-10-03 DIAGNOSIS — Z7901 Long term (current) use of anticoagulants: Secondary | ICD-10-CM | POA: Diagnosis not present

## 2015-10-03 DIAGNOSIS — A047 Enterocolitis due to Clostridium difficile: Secondary | ICD-10-CM | POA: Diagnosis not present

## 2015-10-03 LAB — BASIC METABOLIC PANEL
ANION GAP: 11 (ref 5–15)
BUN: 45 mg/dL — ABNORMAL HIGH (ref 6–20)
CALCIUM: 8.2 mg/dL — AB (ref 8.9–10.3)
CO2: 24 mmol/L (ref 22–32)
Chloride: 104 mmol/L (ref 101–111)
Creatinine, Ser: 2.02 mg/dL — ABNORMAL HIGH (ref 0.44–1.00)
GFR, EST AFRICAN AMERICAN: 29 mL/min — AB (ref >60)
GFR, EST NON AFRICAN AMERICAN: 25 mL/min — AB (ref >60)
GLUCOSE: 62 mg/dL — AB (ref 65–99)
Potassium: 4.1 mmol/L (ref 3.5–5.1)
SODIUM: 139 mmol/L (ref 135–145)

## 2015-10-03 LAB — BRAIN NATRIURETIC PEPTIDE: B NATRIURETIC PEPTIDE 5: 637.4 pg/mL — AB (ref 0.0–100.0)

## 2015-10-03 NOTE — Patient Instructions (Addendum)
INCREASE Torsemide to 80 mg twice a day (starting today), for 2 days the restart 60 mg twice a day on Monday Take a Metolazone tomorrow 10/04/15, then every Tuesday and Friday thereafter  Labs today   Your physician recommends that you schedule a follow-up appointment in: 1 week   Do the following things EVERYDAY: 1) Weigh yourself in the morning before breakfast. Write it down and keep it in a log. 2) Take your medicines as prescribed 3) Eat low salt foods-Limit salt (sodium) to 2000 mg per day.  4) Stay as active as you can everyday 5) Limit all fluids for the day to less than 2 liters

## 2015-10-04 NOTE — Progress Notes (Signed)
Patient ID: DAREN DOSWELL, female   DOB: 1950-06-04, 65 y.o.   MRN: [id number]    Advanced Heart Failure Clinic Note   Pulmonary: Dr Lamonte Sakai  HF: Dr Aundra Dubin  HPI: Ms. Rosch is a 65 yo female with a history of DM2, HLD, HTN, pHTN, GERD, gastroparesis, scleroderma, DJD and diastolic HF.   Of note she has been on several PAH targeted therapies and coumadin over last few years. She has had best response to Tyvaso (+ bosentan) with an improvement in 6 minute walk and in PASP from 102 mmHg (7/09) to 42 mmHg (9/10). After stopping Tyvaso her PASP rose to 80's by TTE 6/13 and 11/14. Surprisingly, she has never required supplemental O2. Macitentan was started in 11/14. She developed nausea and diarrhea and macitentan was stopped early 3/15. Most recently, she had been on Letairis and Adcirca (followed by Dr. Lamonte Sakai).   Admitted 10/14-10/18/15 for A/C RHF, volume overload and syncope. Diuresed with IV lasix and milrinone a total of 26 lbs. Discharge weight was 156 lbs and started on lasix 40 mg BID.   Admitted 12/29 with dyspnea and chest pain. BP was low, and Lasix and Adcirca were both initially held. Echo showed EF 60-65% with moderately dilated and dysfunctional RV, PASP 111 mmHg with moderate TR. There was a large pericardial effusion without definite tamponade. Presentation was suspected to be due to progressive PAH with secondary RV failure rather than tamponade. No pericardiocentesis yet. She was started on milrinone gtt and diuresed. Shorter-acting sildenafil was started instead of Adcirca. Also had GI bleed. 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 Discharge weight was 146 pounds.   She was admitted in 2/16 with acute on chronic diastolic CHF/RV failure and hypotension.  Echo (2/16) showed EF 60%, moderate-severe RV dilation with moderate-severe RV systolic dysfunction, PA systolic pressure 62 mmHg, large pericardial effusion with borderline tamponade.   She had pericardiocentesis with improvement in symptoms.  No malignant cells in pericardial fluid.  Repeat echo showed no pericardial effusion.  She developed paroxysmal atrial fibrillation on 2/19 but went back into NSR on amiodarone.  She was not anticoagulated given history of GI bleeding and pericardial effusion.   She was again admitted in 10/16.  She had been out of meds x 1 week and gained 20 lbs.  She also was noted to be severely anemic with iron deficiency.  She was heme negative.  She got 2 units PRBCs.  She was diuresed extensively.  She was started on Levoxyl for hypothyroidism.   Admitted In December 2016  with GI bleed. Multiple AVMs noted with 10-15 ablations completed. PAH meds continued. Diuretics cut back --torsemide 40 mg daily. Discharge weight was 138  pounds.   She was admitted again in early 4/17 with recurrent GI bleed.  She had enteroscopy with clipping of small bowel AVM.  She was take off her diuretics at that time.    She was admitted in 6/17 with C difficile colitis.  Metolazone was stopped.  She was admitted again in 7/17 with C difficile colitis.  Torsemide was decreased to 60 mg daily.   She returns for followup today.  Weight is up 3 lbs.  No longer having diarrhea, still on po vancomycin for recent C difficile. She is very weak with poor appetite.  Has had falls but no injury. Abdomen feels tight.  She is short of breath walking around the house.  No further BRBPR or melena.  She called  in Wednesday with dyspnea and weight gain, torsemide increased to 60 mg bid.  She took metolazone for the first time today.   6 min walk (05/16/14): 125 m 6 min walk (6/16): 146 m 6 min walk (8/16): 183 m 6 min walk (1/17): 170 m (2 breaks, may have short changed her a bit - stable from last) 6 min walk (5/17): 120 m (multiple rest breaks, not short of breath but limited by leg pain).   Echo (1/16) with EF 55-60%, severely dilated RA with moderately decreased systolic function, large  pericardial effusion.  ECHO 05/09/2014: EF 60-65% Peak PA pressure 52  ECHO 06/05/14: EF 55-60%, RV moderately dilated with moderately decreased systolic function with D-shaped interventricular septum. Severe RAE. RVSP 59. No pericardial effusion. Echo (8/16): EF 60-65%, moderate-severe RV dilation with mildly decreased RV systolic function, D-shaped interventricular septum, PASP 92, severe biatrial enlargement, no pericardial effusion.  Echo (10/16): EF 60-65%, D-shaped septum, RV moderately dilated, PASP 76 mmHg, severe TR.  ABIs (11/16): Normal  RHC (1/16):  Hemodynamics (mmHg) RA mean 12 RV 64/14 PA 68/29, mean 42 PCWP mean 12 LV 102/14 AO 105/53 PA 67% AO 93% Cardiac Output (Fick) 6.63  Cardiac Index (Fick) 3.88  PVR 4.5 WU  Labs 03/24/2014 K 3.8 Creatinine 1.51 Hgb 9.3  Labs 2/16 BNP 121, K 4.4, creatinine 1.27, hgb 9.2 Labs 2/16 K 3.8, creatinine 1.58, AST 45, ALT 12 Labs 05/22/2014: Creatinine 2.1 K 3.7  Labs 06/05/2014: Creatinine 1.5 K 3.7, LFTs normal Labs 6/16: K 4.4, creatinine 1.82, hgb 8.8 Labs 10/16: K 4.4, creatinine 1.77 => 1.64, hgb 7.9 => 8.4, LFTs normal, Mg 1.6 Labs 01/21/2015: K 3.8 Creatinine 1.67  Labs 02/2015: K 4.3 Creatinine 1.72 => 2.4 Hgb 8.5  Labs 4/17: hgb 8.6, creatinine 1.8 => 1.68, K 3.9, LFTs normal Labs 7/17: K 4.3, creatinine 1.74 => 2.29  ECG: NSR, low voltage, 1st degree AV block.  ROS: All systems negative except as listed in HPI, PMH and Problem List.  SH:  Social History   Social History  . Marital Status: Legally Separated    Spouse Name: N/A  . Number of Children: 2  . Years of Education: 11   Occupational History  . Umemployed   . Disability     Social History Main Topics  . Smoking status: Never Smoker   . Smokeless tobacco: Never Used  . Alcohol Use: No  . Drug Use: No  . Sexual Activity: No   Other Topics Concern  . Not on file   Social History Narrative    FAMILY HISTORY:  Significant for coronary artery disease  and diabetes   Patient lives at home with granddaughter.    Patient has 2 children.    Patient has 11 years of education.    Patient is on disability.    Patient is right handed.    Patient is separated.      FH:  Family History  Problem Relation Age of Onset  . Heart disease Mother   . Diabetes Mother   . Diabetes Sister     Past Medical History  Diagnosis Date  . Secondary pulmonary hypertension ([place])     a. 2/2 scleroderma  . GERD (gastroesophageal reflux disease)   . Systemic sclerosis ([place])   . Unspecified essential hypertension   . Gastroparesis   . Sickle cell trait ([place])   . Obesity   . Scleroderma ([place])   . Trichomonas   . Peripheral neuropathy ([place])   .  Type II diabetes mellitus ([facility])     a. diet control   . Arthritis   . Chronic kidney disease   . Gout   . Hypothyroidism     a. may be due to amiodarone use. started on synthroid on 12/2014 admission   . Anemia     a.  hx of GI bleed and duodenal AVMs  . Chronic diastolic CHF (congestive heart failure) ([place])     a. Echo 12/23/14 withEF 60-65%, moderately dilated RV, PASP 72, no pericardial effusion.  Marland Kitchen PAF (paroxysmal atrial fibrillation) ([facility])     a. not a long term AC candidate due to GI bleeds  . Angiodysplasia of colon with hemorrhage   . Dysrhythmia   . Transfusion history     last 06-21-15    Current Outpatient Prescriptions  Medication Sig Dispense Refill  . ambrisentan (LETAIRIS) 10 MG tablet Take 1 tablet (10 mg total) by mouth daily. 30 tablet 11  . Amino Acids-Protein Hydrolys (FEEDING SUPPLEMENT, PRO-STAT SUGAR FREE 64,) LIQD Take 30 mLs by mouth 3 (three) times daily between meals. 900 mL 0  . amiodarone (PACERONE) 200 MG tablet Take 1 tablet (200 mg total) by mouth daily. 90 tablet 6  . esomeprazole (NEXIUM) 40 MG capsule Take 40 mg by mouth daily at 12 noon.     . feeding supplement (BOOST / RESOURCE BREEZE) LIQD Take 1 Container by mouth 3 (three) times daily between meals. 30 Container 0   . fenofibrate 54 MG tablet Take 1 tablet (54 mg total) by mouth daily. 30 tablet 12  . FLUoxetine (PROZAC) 20 MG capsule Take 1 capsule (20 mg total) by mouth every morning. 30 capsule 5  . HYDROcodone-acetaminophen (NORCO) 10-325 MG tablet Take 1 tablet by mouth every 6 (six) hours. pain  0  . hydrOXYzine (ATARAX/VISTARIL) 10 MG tablet Take 1 tablet (10 mg total) by mouth 2 (two) times daily as needed for itching. 20 tablet 0  . levothyroxine (SYNTHROID, LEVOTHROID) 25 MCG tablet Take 1 tablet (25 mcg total) by mouth daily before breakfast. 30 tablet 11  . metolazone (ZAROXOLYN) 2.5 MG tablet Take 2.5 mg by mouth 2 (two) times a week. Tuesdays and Fridays  0  . nortriptyline (PAMELOR) 10 MG capsule Take 2 capsules (20 mg total) by mouth at bedtime. 60 capsule 8  . potassium chloride SA (K-DUR,KLOR-CON) 20 MEQ tablet Take 20 mEq by mouth daily. Take an additional 20 mEq when taking Metolazone    . pregabalin (LYRICA) 150 MG capsule Take 1 capsule (150 mg total) by mouth 3 (three) times daily. 90 capsule 5  . Riociguat (ADEMPAS) 2.5 MG TABS Take by mouth.    . Selexipag (UPTRAVI) 1600 MCG TABS Take 1,600 mcg by mouth 2 (two) times daily. 60 tablet 3  . torsemide (DEMADEX) 20 MG tablet Take 3 tablets (60 mg total) by mouth 2 (two) times daily. 90 tablet 0  . vancomycin (VANCOCIN) 50 mg/mL oral solution Take 2.5 mLs (125 mg total) by mouth every 6 (six) hours. 122.5 mL 0   No current facility-administered medications for this encounter.    Filed Vitals:   10/03/15 1407  BP: 100/58  Pulse: 65  Weight: 152 lb 12.8 oz (69.31 kg)  SpO2: 96%   Wt Readings from Last 3 Encounters:  10/03/15 152 lb 12.8 oz (69.31 kg)  09/17/15 157 lb 9.6 oz (71.487 kg)  08/29/15 151 lb (68.493 kg)     PHYSICAL EXAM: General: Elderly and fatigued appearing. Sister present.  Neck: JVP 14-16 cm. No thyromegaly or thyroid nodule. Lungs: diminished, bilateral crackles in bases. CV: RV heave. Heart regular S1/S2  with widely split S2,  3/6 HSM LLSB. 1+ edema to knees bilaterally.   Unable to palpate pedal pulses.  Abdomen: soft, NT, moderately distended, no HSM. No bruits or masses. +BS  Neurologic: Alert and oriented x 3.  Psych: Affect flat. Extremities: No clubbing or cyanosis.   ASSESSMENT & PLAN: 1. 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  Echo 10/16  EF 60-65% with moderate dilated and mildly dysfunctional RV, severe TR. Pericardial effusion had resolved. Diuretics cut back with recent C difficile colitis, now weight is up and she is volume overloaded on exam with NYHA class IIIb symptoms.  Suspect weight would be up more from fluid but she has lost fat/muscle with poor nutritional intake recently.   - Torsemide just increased to 60 mg bid, will increase to 80 mg bid x 2 days then back to 60 mg bid.  - Take metolazone 2.5 mg on Saturday again then go back to every Monday and Friday (twice a week) as she had been doing at last appointment with me.  - KCl 20 daily, take an extra KCl 20 on metolazone days.  - BMET/BNP today and repeat in 1 week.  Will have to follow closely with recent rise in creatinine. Since she is symptomatically volume overloaded, think we have little choice but to try to diurese her.  2. PAH with cor pulmonale: Severe PAH.  Had last [place] in 1/16. Pulmonary pressure remained elevated by echo in 10/16. - Continue ambrisentan 10 mg daily.  - Continue selexipag 1600 mcg bid.  - She is now on riociguat 2.5 mg tid.   - No 6 minute walk today with CHF exacerbation.  3. CKD stage III: BMET today and next week, see above.   4. 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  She is no longer anticoagulated.  5. Pericardial effusion: Resolved after pericardiocentesis, not seen on echo 12/2014.   6. Atrial fibrillation: Paroxysmal. NSR today.  - Continue amiodarone.  Recent LFTs normal.  She will get a yearly eye exam  while on amiodarone.  - No anticoagulation with h/o GI bleeding.  7.  Hypothyroidism: May be related to amiodarone.   - On Levoxyl 25 mcg daily.   Should be followed by PCP.   She will need followup in the office next week.  May need to go to ER if worsens.   [name] 10/04/2015

## 2015-10-06 ENCOUNTER — Ambulatory Visit: Payer: Medicare Other

## 2015-10-06 ENCOUNTER — Encounter: Payer: Self-pay | Admitting: Internal Medicine

## 2015-10-07 ENCOUNTER — Telehealth (HOSPITAL_COMMUNITY): Payer: Self-pay

## 2015-10-07 NOTE — Telephone Encounter (Signed)
Received call from [place] needing updated contact # for patient. All numbers on chart given over the phone.  Renee Pain

## 2015-10-08 DIAGNOSIS — M349 Systemic sclerosis, unspecified: Secondary | ICD-10-CM | POA: Diagnosis not present

## 2015-10-08 DIAGNOSIS — E039 Hypothyroidism, unspecified: Secondary | ICD-10-CM | POA: Diagnosis not present

## 2015-10-08 DIAGNOSIS — E1140 Type 2 diabetes mellitus with diabetic neuropathy, unspecified: Secondary | ICD-10-CM | POA: Diagnosis not present

## 2015-10-08 DIAGNOSIS — E1122 Type 2 diabetes mellitus with diabetic chronic kidney disease: Secondary | ICD-10-CM | POA: Diagnosis not present

## 2015-10-08 DIAGNOSIS — I272 Other secondary pulmonary hypertension: Secondary | ICD-10-CM | POA: Diagnosis not present

## 2015-10-08 DIAGNOSIS — I480 Paroxysmal atrial fibrillation: Secondary | ICD-10-CM | POA: Diagnosis not present

## 2015-10-08 DIAGNOSIS — D509 Iron deficiency anemia, unspecified: Secondary | ICD-10-CM | POA: Diagnosis not present

## 2015-10-08 DIAGNOSIS — I130 Hypertensive heart and chronic kidney disease with heart failure and stage 1 through stage 4 chronic kidney disease, or unspecified chronic kidney disease: Secondary | ICD-10-CM | POA: Diagnosis not present

## 2015-10-08 DIAGNOSIS — A047 Enterocolitis due to Clostridium difficile: Secondary | ICD-10-CM | POA: Diagnosis not present

## 2015-10-08 DIAGNOSIS — N189 Chronic kidney disease, unspecified: Secondary | ICD-10-CM | POA: Diagnosis not present

## 2015-10-08 DIAGNOSIS — I5032 Chronic diastolic (congestive) heart failure: Secondary | ICD-10-CM | POA: Diagnosis not present

## 2015-10-09 ENCOUNTER — Encounter (HOSPITAL_COMMUNITY): Payer: Self-pay | Admitting: *Deleted

## 2015-10-09 ENCOUNTER — Inpatient Hospital Stay (HOSPITAL_COMMUNITY)
Admission: AD | Admit: 2015-10-09 | Discharge: 2015-10-15 | DRG: 291 | Disposition: A | Discharge status: Home Health | Payer: Medicare Other | Source: Ambulatory Visit | Attending: Cardiology | Admitting: Cardiology

## 2015-10-09 ENCOUNTER — Other Ambulatory Visit: Payer: Self-pay

## 2015-10-09 ENCOUNTER — Ambulatory Visit (HOSPITAL_COMMUNITY)
Admission: RE | Admit: 2015-10-09 | Discharge: 2015-10-09 | Disposition: A | Discharge status: Home / Self Care | Payer: Medicare Other | Source: Ambulatory Visit | Attending: Cardiology | Admitting: Cardiology

## 2015-10-09 VITALS — BP 83/48 | HR 65 | Resp 16 | Wt 154.0 lb

## 2015-10-09 DIAGNOSIS — I130 Hypertensive heart and chronic kidney disease with heart failure and stage 1 through stage 4 chronic kidney disease, or unspecified chronic kidney disease: Secondary | ICD-10-CM | POA: Diagnosis not present

## 2015-10-09 DIAGNOSIS — I2609 Other pulmonary embolism with acute cor pulmonale: Secondary | ICD-10-CM

## 2015-10-09 DIAGNOSIS — Z885 Allergy status to narcotic agent status: Secondary | ICD-10-CM | POA: Diagnosis not present

## 2015-10-09 DIAGNOSIS — I480 Paroxysmal atrial fibrillation: Secondary | ICD-10-CM | POA: Diagnosis not present

## 2015-10-09 DIAGNOSIS — Z8249 Family history of ischemic heart disease and other diseases of the circulatory system: Secondary | ICD-10-CM | POA: Diagnosis not present

## 2015-10-09 DIAGNOSIS — Z881 Allergy status to other antibiotic agents status: Secondary | ICD-10-CM

## 2015-10-09 DIAGNOSIS — E669 Obesity, unspecified: Secondary | ICD-10-CM | POA: Diagnosis present

## 2015-10-09 DIAGNOSIS — E876 Hypokalemia: Secondary | ICD-10-CM | POA: Diagnosis not present

## 2015-10-09 DIAGNOSIS — I5033 Acute on chronic diastolic (congestive) heart failure: Secondary | ICD-10-CM | POA: Diagnosis not present

## 2015-10-09 DIAGNOSIS — E039 Hypothyroidism, unspecified: Secondary | ICD-10-CM | POA: Diagnosis present

## 2015-10-09 DIAGNOSIS — N179 Acute kidney failure, unspecified: Secondary | ICD-10-CM | POA: Diagnosis not present

## 2015-10-09 DIAGNOSIS — Z91041 Radiographic dye allergy status: Secondary | ICD-10-CM

## 2015-10-09 DIAGNOSIS — N183 Chronic kidney disease, stage 3 (moderate): Secondary | ICD-10-CM | POA: Diagnosis present

## 2015-10-09 DIAGNOSIS — K219 Gastro-esophageal reflux disease without esophagitis: Secondary | ICD-10-CM | POA: Diagnosis not present

## 2015-10-09 DIAGNOSIS — I361 Nonrheumatic tricuspid (valve) insufficiency: Secondary | ICD-10-CM | POA: Diagnosis present

## 2015-10-09 DIAGNOSIS — R188 Other ascites: Secondary | ICD-10-CM

## 2015-10-09 DIAGNOSIS — D573 Sickle-cell trait: Secondary | ICD-10-CM | POA: Diagnosis present

## 2015-10-09 DIAGNOSIS — Z79899 Other long term (current) drug therapy: Secondary | ICD-10-CM

## 2015-10-09 DIAGNOSIS — K3184 Gastroparesis: Secondary | ICD-10-CM | POA: Diagnosis present

## 2015-10-09 DIAGNOSIS — Z833 Family history of diabetes mellitus: Secondary | ICD-10-CM

## 2015-10-09 DIAGNOSIS — D649 Anemia, unspecified: Secondary | ICD-10-CM | POA: Diagnosis not present

## 2015-10-09 DIAGNOSIS — I272 Other secondary pulmonary hypertension: Secondary | ICD-10-CM | POA: Diagnosis present

## 2015-10-09 DIAGNOSIS — E785 Hyperlipidemia, unspecified: Secondary | ICD-10-CM | POA: Diagnosis not present

## 2015-10-09 DIAGNOSIS — M109 Gout, unspecified: Secondary | ICD-10-CM | POA: Diagnosis present

## 2015-10-09 DIAGNOSIS — Z96652 Presence of left artificial knee joint: Secondary | ICD-10-CM | POA: Diagnosis not present

## 2015-10-09 DIAGNOSIS — I2781 Cor pulmonale (chronic): Secondary | ICD-10-CM | POA: Diagnosis not present

## 2015-10-09 DIAGNOSIS — M349 Systemic sclerosis, unspecified: Secondary | ICD-10-CM | POA: Diagnosis present

## 2015-10-09 DIAGNOSIS — E1122 Type 2 diabetes mellitus with diabetic chronic kidney disease: Secondary | ICD-10-CM | POA: Diagnosis not present

## 2015-10-09 DIAGNOSIS — I5032 Chronic diastolic (congestive) heart failure: Secondary | ICD-10-CM

## 2015-10-09 DIAGNOSIS — E1143 Type 2 diabetes mellitus with diabetic autonomic (poly)neuropathy: Secondary | ICD-10-CM | POA: Diagnosis not present

## 2015-10-09 DIAGNOSIS — E1142 Type 2 diabetes mellitus with diabetic polyneuropathy: Secondary | ICD-10-CM | POA: Diagnosis present

## 2015-10-09 DIAGNOSIS — I509 Heart failure, unspecified: Secondary | ICD-10-CM | POA: Diagnosis not present

## 2015-10-09 LAB — COMPREHENSIVE METABOLIC PANEL
ALK PHOS: 122 U/L (ref 38–126)
ALT: 7 U/L — ABNORMAL LOW (ref 14–54)
ANION GAP: 9 (ref 5–15)
AST: 16 U/L (ref 15–41)
Albumin: 2.7 g/dL — ABNORMAL LOW (ref 3.5–5.0)
BUN: 46 mg/dL — ABNORMAL HIGH (ref 6–20)
CALCIUM: 8.4 mg/dL — AB (ref 8.9–10.3)
CHLORIDE: 105 mmol/L (ref 101–111)
CO2: 26 mmol/L (ref 22–32)
Creatinine, Ser: 1.78 mg/dL — ABNORMAL HIGH (ref 0.44–1.00)
GFR, EST AFRICAN AMERICAN: 34 mL/min — AB (ref >60)
GFR, EST NON AFRICAN AMERICAN: 29 mL/min — AB (ref >60)
Glucose, Bld: 67 mg/dL (ref 65–99)
Potassium: 3.9 mmol/L (ref 3.5–5.1)
Sodium: 140 mmol/L (ref 135–145)
Total Bilirubin: 0.8 mg/dL (ref 0.3–1.2)
Total Protein: 7.1 g/dL (ref 6.5–8.1)

## 2015-10-09 LAB — TSH: TSH: 4.32 u[IU]/mL (ref 0.350–4.500)

## 2015-10-09 LAB — CBC WITH DIFFERENTIAL/PLATELET
BASOS ABS: 0 10*3/uL (ref 0.0–0.1)
Basophils Relative: 0 %
EOS ABS: 0 10*3/uL (ref 0.0–0.7)
Eosinophils Relative: 0 %
HCT: 24.9 % — ABNORMAL LOW (ref 36.0–46.0)
Hemoglobin: 8.1 g/dL — ABNORMAL LOW (ref 12.0–15.0)
LYMPHS PCT: 23 %
Lymphs Abs: 1 10*3/uL (ref 0.7–4.0)
MCH: 24.5 pg — AB (ref 26.0–34.0)
MCHC: 32.5 g/dL (ref 30.0–36.0)
MCV: 75.5 fL — ABNORMAL LOW (ref 78.0–100.0)
MONOS PCT: 10 %
Monocytes Absolute: 0.5 10*3/uL (ref 0.1–1.0)
Neutro Abs: 3.2 10*3/uL (ref 1.7–7.7)
Neutrophils Relative %: 67 %
PLATELETS: 106 10*3/uL — AB (ref 150–400)
RBC: 3.3 MIL/uL — ABNORMAL LOW (ref 3.87–5.11)
RDW: 21.8 % — ABNORMAL HIGH (ref 11.5–15.5)
WBC: 4.8 10*3/uL (ref 4.0–10.5)

## 2015-10-09 LAB — MRSA PCR SCREENING: MRSA BY PCR: NEGATIVE

## 2015-10-09 MED ORDER — FUROSEMIDE 10 MG/ML IJ SOLN
80.0000 mg | Freq: Three times a day (TID) | INTRAMUSCULAR | Status: AC
  Administered 2015-10-09 – 2015-10-14 (×16): 80 mg via INTRAVENOUS
  Filled 2015-10-09 (×18): qty 8

## 2015-10-09 MED ORDER — FLUOXETINE HCL 20 MG PO CAPS
20.0000 mg | ORAL_CAPSULE | Freq: Every morning | ORAL | Status: DC
  Administered 2015-10-10 – 2015-10-15 (×6): 20 mg via ORAL
  Filled 2015-10-09 (×6): qty 1

## 2015-10-09 MED ORDER — SODIUM CHLORIDE 0.9 % IV SOLN
250.0000 mL | INTRAVENOUS | Status: DC | PRN
  Administered 2015-10-09: 250 mL via INTRAVENOUS

## 2015-10-09 MED ORDER — ENOXAPARIN SODIUM 30 MG/0.3ML ~~LOC~~ SOLN
30.0000 mg | SUBCUTANEOUS | Status: DC
  Administered 2015-10-09 – 2015-10-14 (×5): 30 mg via SUBCUTANEOUS
  Filled 2015-10-09 (×6): qty 0.3

## 2015-10-09 MED ORDER — PREGABALIN 50 MG PO CAPS
150.0000 mg | ORAL_CAPSULE | Freq: Three times a day (TID) | ORAL | Status: DC
Start: 2015-10-09 — End: 2015-10-15
  Administered 2015-10-09 – 2015-10-15 (×17): 150 mg via ORAL
  Filled 2015-10-09 (×17): qty 3

## 2015-10-09 MED ORDER — SODIUM CHLORIDE 0.9% FLUSH: 3.0000 mL | INTRAVENOUS | Status: DC | PRN

## 2015-10-09 MED ORDER — ONDANSETRON HCL 4 MG/2ML IJ SOLN: 4.0000 mg | Freq: Four times a day (QID) | INTRAMUSCULAR | Status: DC | PRN

## 2015-10-09 MED ORDER — SODIUM CHLORIDE 0.9% FLUSH
3.0000 mL | Freq: Two times a day (BID) | INTRAVENOUS | Status: DC
  Administered 2015-10-10 – 2015-10-15 (×10): 3 mL via INTRAVENOUS

## 2015-10-09 MED ORDER — PANTOPRAZOLE SODIUM 40 MG PO TBEC
40.0000 mg | DELAYED_RELEASE_TABLET | Freq: Every day | ORAL | Status: DC
  Administered 2015-10-10 – 2015-10-15 (×6): 40 mg via ORAL
  Filled 2015-10-09 (×6): qty 1

## 2015-10-09 MED ORDER — ACETAMINOPHEN 325 MG PO TABS: 650.0000 mg | ORAL_TABLET | ORAL | Status: DC | PRN

## 2015-10-09 MED ORDER — FENOFIBRATE 54 MG PO TABS
54.0000 mg | ORAL_TABLET | Freq: Every day | ORAL | Status: DC
  Administered 2015-10-10 – 2015-10-15 (×6): 54 mg via ORAL
  Filled 2015-10-09 (×6): qty 1

## 2015-10-09 MED ORDER — BOOST / RESOURCE BREEZE PO LIQD
1.0000 | Freq: Three times a day (TID) | ORAL | Status: DC
  Administered 2015-10-13: 1 via ORAL

## 2015-10-09 MED ORDER — POTASSIUM CHLORIDE CRYS ER 20 MEQ PO TBCR
20.0000 meq | EXTENDED_RELEASE_TABLET | Freq: Every day | ORAL | Status: DC
  Administered 2015-10-09 – 2015-10-11 (×3): 20 meq via ORAL
  Filled 2015-10-09 (×3): qty 1

## 2015-10-09 MED ORDER — LEVOTHYROXINE SODIUM 25 MCG PO TABS
25.0000 ug | ORAL_TABLET | Freq: Every day | ORAL | Status: DC
  Administered 2015-10-10 – 2015-10-15 (×6): 25 ug via ORAL
  Filled 2015-10-09 (×6): qty 1

## 2015-10-09 MED ORDER — PRO-STAT SUGAR FREE PO LIQD
30.0000 mL | Freq: Three times a day (TID) | ORAL | Status: DC
  Administered 2015-10-10 – 2015-10-13 (×2): 30 mL via ORAL
  Filled 2015-10-09 (×6): qty 30

## 2015-10-09 MED ORDER — NORTRIPTYLINE HCL 10 MG PO CAPS
20.0000 mg | ORAL_CAPSULE | Freq: Every day | ORAL | Status: DC
  Administered 2015-10-09 – 2015-10-14 (×6): 20 mg via ORAL
  Filled 2015-10-09 (×7): qty 2

## 2015-10-09 MED ORDER — AMBRISENTAN 5 MG PO TABS
10.0000 mg | ORAL_TABLET | Freq: Every day | ORAL | Status: DC
  Filled 2015-10-09: qty 2

## 2015-10-09 MED ORDER — SELEXIPAG 1600 MCG PO TABS
1600.0000 ug | ORAL_TABLET | Freq: Two times a day (BID) | ORAL | Status: DC
  Administered 2015-10-10 – 2015-10-15 (×10): 1600 ug via ORAL
  Filled 2015-10-09 (×8): qty 1

## 2015-10-09 MED ORDER — HYDROCODONE-ACETAMINOPHEN 10-325 MG PO TABS
1.0000 | ORAL_TABLET | Freq: Four times a day (QID) | ORAL | Status: DC | PRN
  Administered 2015-10-10 – 2015-10-15 (×9): 1 via ORAL
  Filled 2015-10-09 (×9): qty 1

## 2015-10-09 MED ORDER — HYDROXYZINE HCL 10 MG PO TABS: 10.0000 mg | ORAL_TABLET | Freq: Two times a day (BID) | ORAL | Status: DC | PRN

## 2015-10-09 MED ORDER — AMIODARONE HCL 200 MG PO TABS
200.0000 mg | ORAL_TABLET | Freq: Every day | ORAL | Status: DC
  Administered 2015-10-09 – 2015-10-15 (×7): 200 mg via ORAL
  Filled 2015-10-09 (×7): qty 1

## 2015-10-09 NOTE — Addendum Note (Signed)
Encounter addended by: Effie Berkshire, RN on: 10/09/2015  3:44 PM<BR>    Actions taken: Order Entry activity accessed, Sign clinical note

## 2015-10-09 NOTE — Progress Notes (Signed)
Pt 65 yrs old African American Female admitted from Heart failure Clinic with Dx Heart failure and abdominal distention. Pt alert  and oriented x 3 .  Oriented to unit and enviroment .,cardiac monitoring in use,  NR with 1st degree HB.  Pt . In no acute distress vs stable.  for Picc line placement . RN will continue to monitor. [name]

## 2015-10-09 NOTE — Progress Notes (Addendum)
Patient ID: KIRIN BRANDENBURGER, female   DOB: 01/10/1951, 65 y.o.   MRN: [id number]    Advanced Heart Failure Clinic Note   Pulmonary: Dr Lamonte Sakai  HF: Dr Aundra Dubin  HPI: Ms. Brick is a 65 yo female with a history of DM2, HLD, HTN, pHTN, GERD, gastroparesis, scleroderma, DJD and diastolic HF.   Of note she has been on several PAH targeted therapies and coumadin over last few years. She has had best response to Tyvaso (+ bosentan) with an improvement in 6 minute walk and in PASP from 102 mmHg (7/09) to 42 mmHg (9/10). After stopping Tyvaso her PASP rose to 80's by TTE 6/13 and 11/14. Surprisingly, she has never required supplemental O2. Macitentan was started in 11/14. She developed nausea and diarrhea and macitentan was stopped early 3/15. Most recently, she had been on Letairis and Adcirca (followed by Dr. Lamonte Sakai).   Admitted 10/14-10/18/15 for A/C RHF, volume overload and syncope. Diuresed with IV lasix and milrinone a total of 26 lbs. Discharge weight was 156 lbs and started on lasix 40 mg BID.   Admitted 12/29 with dyspnea and chest pain. BP was low, and Lasix and Adcirca were both initially held. Echo showed EF 60-65% with moderately dilated and dysfunctional RV, PASP 111 mmHg with moderate TR. There was a large pericardial effusion without definite tamponade. Presentation was suspected to be due to progressive PAH with secondary RV failure rather than tamponade. No pericardiocentesis yet. She was started on milrinone gtt and diuresed. Shorter-acting sildenafil was started instead of Adcirca. Also had GI bleed. 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 Discharge weight was 146 pounds.   She was admitted in 2/16 with acute on chronic diastolic CHF/RV failure and hypotension.  Echo (2/16) showed EF 60%, moderate-severe RV dilation with moderate-severe RV systolic dysfunction, PA systolic pressure 62 mmHg, large pericardial effusion with borderline tamponade.   She had pericardiocentesis with improvement in symptoms.  No malignant cells in pericardial fluid.  Repeat echo showed no pericardial effusion.  She developed paroxysmal atrial fibrillation on 2/19 but went back into NSR on amiodarone.  She was not anticoagulated given history of GI bleeding and pericardial effusion.   She was again admitted in 10/16.  She had been out of meds x 1 week and gained 20 lbs.  She also was noted to be severely anemic with iron deficiency.  She was heme negative.  She got 2 units PRBCs.  She was diuresed extensively.  She was started on Levoxyl for hypothyroidism.   Admitted In December 2016  with GI bleed. Multiple AVMs noted with 10-15 ablations completed. PAH meds continued. Diuretics cut back --torsemide 40 mg daily. Discharge weight was 138  pounds.   She was admitted again in early 4/17 with recurrent GI bleed.  She had enteroscopy with clipping of small bowel AVM.  She was take off her diuretics at that time.    She was admitted in 6/17 with C difficile colitis.  Metolazone was stopped.  She was admitted again in 7/17 with C difficile colitis.  Torsemide was decreased to 60 mg daily.   She returns for followup today. Seen last week with volume overload and torsemide and metolazone increased. She does not feel like she is peeing very much at all. Weight up 2 more lbs. Lightheaded with activity. She is extremely fatigued and has no appetite.  She is sleepy in the clinic today.  Abdomen tight. No bleeding in stools.  6 min walk (05/16/14): 125 m 6 min walk (6/16): 146 m 6 min walk (8/16): 183 m 6 min walk (1/17): 170 m (2 breaks, may have short changed her a bit - stable from last) 6 min walk (5/17): 120 m (multiple rest breaks, not short of breath but limited by leg pain).   Echo (1/16) with EF 55-60%, severely dilated RA with moderately decreased systolic function, large pericardial effusion.  ECHO 05/09/2014: EF 60-65% Peak PA pressure 52  ECHO 06/05/14: EF 55-60%,  RV moderately dilated with moderately decreased systolic function with D-shaped interventricular septum. Severe RAE. RVSP 59. No pericardial effusion. Echo (8/16): EF 60-65%, moderate-severe RV dilation with mildly decreased RV systolic function, D-shaped interventricular septum, PASP 92, severe biatrial enlargement, no pericardial effusion.  Echo (10/16): EF 60-65%, D-shaped septum, RV moderately dilated, PASP 76 mmHg, severe TR.  ABIs (11/16): Normal  RHC (1/16):  Hemodynamics (mmHg) RA mean 12 RV 64/14 PA 68/29, mean 42 PCWP mean 12 LV 102/14 AO 105/53 PA 67% AO 93% Cardiac Output (Fick) 6.63  Cardiac Index (Fick) 3.88  PVR 4.5 WU  Labs 03/24/2014 K 3.8 Creatinine 1.51 Hgb 9.3  Labs 2/16 BNP 121, K 4.4, creatinine 1.27, hgb 9.2 Labs 2/16 K 3.8, creatinine 1.58, AST 45, ALT 12 Labs 05/22/2014: Creatinine 2.1 K 3.7  Labs 06/05/2014: Creatinine 1.5 K 3.7, LFTs normal Labs 6/16: K 4.4, creatinine 1.82, hgb 8.8 Labs 10/16: K 4.4, creatinine 1.77 => 1.64, hgb 7.9 => 8.4, LFTs normal, Mg 1.6 Labs 01/21/2015: K 3.8 Creatinine 1.67  Labs 02/2015: K 4.3 Creatinine 1.72 => 2.4 Hgb 8.5  Labs 4/17: hgb 8.6, creatinine 1.8 => 1.68, K 3.9, LFTs normal Labs 7/17: K 4.3, creatinine 1.74 => 2.29  ECG: NSR, low voltage, 1st degree AV block.  ROS: All systems negative except as listed in HPI, PMH and Problem List.  SH:  Social History   Social History  . Marital status: Legally Separated    Spouse name: N/A  . Number of children: 2  . Years of education: 11   Occupational History  . Umemployed Disability  . Disability     Social History Main Topics  . Smoking status: Never Smoker  . Smokeless tobacco: Never Used  . Alcohol use No  . Drug use: No  . Sexual activity: No   Other Topics Concern  . Not on file   Social History Narrative    FAMILY HISTORY:  Significant for coronary artery disease and diabetes   Patient lives at home with granddaughter.    Patient has 2 children.      Patient has 11 years of education.    Patient is on disability.    Patient is right handed.    Patient is separated.      FH:  Family History  Problem Relation Age of Onset  . Heart disease Mother   . Diabetes Mother   . Diabetes Sister     Past Medical History:  Diagnosis Date  . Anemia    a.  hx of GI bleed and duodenal AVMs  . Angiodysplasia of colon with hemorrhage   . Arthritis   . Chronic diastolic CHF (congestive heart failure) ([place])    a. Echo 12/23/14 withEF 60-65%, moderately dilated RV, PASP 72, no pericardial effusion.  . Chronic kidney disease   . Dysrhythmia   . Gastroparesis   . GERD (gastroesophageal reflux disease)   . Gout   . Hypothyroidism    a. may be due to  amiodarone use. started on synthroid on 12/2014 admission   . Obesity   . PAF (paroxysmal atrial fibrillation) ([facility])    a. not a long term AC candidate due to GI bleeds  . Peripheral neuropathy ([place])   . Scleroderma ([place])   . Secondary pulmonary hypertension ([place])    a. 2/2 scleroderma  . Sickle cell trait ([place])   . Systemic sclerosis ([place])   . Transfusion history    last 06-21-15  . Trichomonas   . Type II diabetes mellitus ([facility])    a. diet control   . Unspecified essential hypertension     Current Outpatient Prescriptions  Medication Sig Dispense Refill  . ambrisentan (LETAIRIS) 10 MG tablet Take 1 tablet (10 mg total) by mouth daily. 30 tablet 11  . Amino Acids-Protein Hydrolys (FEEDING SUPPLEMENT, PRO-STAT SUGAR FREE 64,) LIQD Take 30 mLs by mouth 3 (three) times daily between meals. 900 mL 0  . amiodarone (PACERONE) 200 MG tablet Take 1 tablet (200 mg total) by mouth daily. 90 tablet 6  . esomeprazole (NEXIUM) 40 MG capsule Take 40 mg by mouth daily at 12 noon.     . feeding supplement (BOOST / RESOURCE BREEZE) LIQD Take 1 Container by mouth 3 (three) times daily between meals. 30 Container 0  . fenofibrate 54 MG tablet Take 1 tablet (54 mg total) by mouth daily. 30 tablet 12  .  FLUoxetine (PROZAC) 20 MG capsule Take 1 capsule (20 mg total) by mouth every morning. 30 capsule 5  . HYDROcodone-acetaminophen (NORCO) 10-325 MG tablet Take 1 tablet by mouth every 6 (six) hours. pain  0  . hydrOXYzine (ATARAX/VISTARIL) 10 MG tablet Take 1 tablet (10 mg total) by mouth 2 (two) times daily as needed for itching. 20 tablet 0  . levothyroxine (SYNTHROID, LEVOTHROID) 25 MCG tablet Take 1 tablet (25 mcg total) by mouth daily before breakfast. 30 tablet 11  . metolazone (ZAROXOLYN) 2.5 MG tablet Take 2.5 mg by mouth 2 (two) times a week. Tuesdays and Fridays  0  . nortriptyline (PAMELOR) 10 MG capsule Take 2 capsules (20 mg total) by mouth at bedtime. 60 capsule 8  . potassium chloride SA (K-DUR,KLOR-CON) 20 MEQ tablet Take 20 mEq by mouth daily. Take an additional 20 mEq when taking Metolazone    . pregabalin (LYRICA) 150 MG capsule Take 1 capsule (150 mg total) by mouth 3 (three) times daily. 90 capsule 5  . Riociguat (ADEMPAS) 2.5 MG TABS Take by mouth.    . Selexipag (UPTRAVI) 1600 MCG TABS Take 1,600 mcg by mouth 2 (two) times daily. 60 tablet 3  . torsemide (DEMADEX) 20 MG tablet Take 3 tablets (60 mg total) by mouth 2 (two) times daily. 90 tablet 0   No current facility-administered medications for this encounter.     Vitals:   10/09/15 1450  BP: (!) 83/48  Pulse: 65  Resp: 16  SpO2: 99%  Weight: 154 lb (69.9 kg)   Wt Readings from Last 3 Encounters:  10/09/15 154 lb (69.9 kg)  10/03/15 152 lb 12.8 oz (69.3 kg)  09/17/15 157 lb 9.6 oz (71.5 kg)     PHYSICAL EXAM: General: Elderly and fatigued appearing. Chronically ill appearing.  Neck: JVP 14-16 + cm. No thyromegaly or thyroid nodule. Lungs: Diminished throughout. Basilar crackles.  CV: RV heave. Heart regular S1/S2 with widely split S2,  3/6 HSM LLSB. Tight edema to thighs and flanks. Unable to palpate pedal pulses.  Abdomen: soft, non-tender, moderately distended, no HSM. No  bruits or masses. +BS    Neurologic: Alert and oriented x 3.  Psych: Affect flat. Extremities: No clubbing or cyanosis.   ASSESSMENT & PLAN: 1. 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  Echo 10/16  EF 60-65% with moderate dilated and mildly dysfunctional RV, severe TR. Pericardial effusion had resolved. Diuretics cut back with recent C difficile colitis, now weight is up and she is volume overloaded on exam with NYHA class IIIb symptoms.  Suspect weight would be up more from fluid but she has lost fat/muscle with poor nutritional intake recently.   - She has marked volume overload on exam. With failure of outpatient therapy will admit for diuresis and further evaluation.  - Will give 80 mg IV lasix q8 hrs to start. Will place PICC to monitor CVP.  2. PAH with cor pulmonale: Severe PAH.  Had last [place] in 1/16. Pulmonary pressure remained elevated by echo in 10/16. - Continue ambrisentan 10 mg daily.  - Continue selexipag 1600 mcg bid.  - Hold riociguat with hypotension.  - No 6 minute walk today with ongoing CHF exacerbation.  3. CKD stage III - Labs today.  4. 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  She is no longer anticoagulated.  - CBC today.  5. Pericardial effusion: Resolved after pericardiocentesis, not seen on echo 12/2014.   6. Atrial fibrillation: Paroxysmal.  - Continue amiodarone. She will get a yearly eye exam while on amiodarone.  - CMET and TSH today.  - No anticoagulation with h/o GI bleeding.  7.  Hypothyroidism: May be related to amiodarone.   - On Levoxyl 25 mcg daily.   Should be followed by PCP.   Will get EKG and ECHO in hospital. Will also do [place] with parecentesis to look for ascites.     Labs for admission drawn in clinic.   Shirley Friar, PA-C 10/09/2015  Patient seen with PA, agree with the above note.  She remains very volume overloaded despite increased diuretics.  Weight up 2 lbs.  Weak, soft blood  pressure.   - Will given Lasix 80 mg IV every 8 hrs to start.   - Place PICC to monitor CVP and co-ox => may need milrinone for RV support in setting of RV failure/pulmonary hypertension.  - Check CBC, history of GI bleeding/anemia.  - ECG to make sure she remains in NSR.  - Repeat echo, she has history of large pericardial effusion.  - Distended abdomen => will get abdominal US and paracentesis if significant fluid present.  - Hold riociguat for now with low BP, continue ambrisentan and selexipag.   [name] 10/09/2015 3:17 PM

## 2015-10-09 NOTE — H&P (Signed)
Advanced Heart Failure Team History and Physical Note   Primary Physician:  Gilles Chiquito, MD Primary Cardiologist:  Dr. Aundra Dubin   Reason for Admission: A/C diastolic CHF with PAH and RV failure.    HPI:    [name] is a 65 yo female with a history of DM2, HLD, HTN, pHTN, GERD, gastroparesis, scleroderma, DJD and diastolic HF.  Has previously required admission for diuresis, and needed milrinone for her RV failure.   She presented today for clinic. Seen last week with volume overload and torsemide and metolazone increased. She does not feel like she is peeing very much at all. Weight up 2 more lbs. Lightheaded with activity. She is extremely fatigued and has no appetite.  She is sleepy in the clinic today.  Abdomen tight. No bleeding in stools.   Review of Systems: [y] = yes, _0  = no   General: Weight gain [y]; Weight loss _1 ; Anorexia _2 ; Fatigue [y]; Fever _3 ; Chills _4 ; Weakness [y]  Cardiac: Chest pain/pressure _5 ; Resting SOB _6 ; Exertional SOB [y]; Orthopnea [y]; Pedal Edema [y]; Palpitations _7 ; Syncope _8 ; Presyncope _9 ; Paroxysmal nocturnal dyspnea_10   Pulmonary: Cough _11 ; Wheezing_12 ; Hemoptysis_13 ; Sputum _14 ; Snoring _15   GI: Vomiting_16 ; Dysphagia_17 ; Melena_18 ; Hematochezia _19 ; Heartburn_20 ; Abdominal pain _21 ; Constipation _22 ; Diarrhea _23 ; BRBPR _24   GU: Hematuria_25 ; Dysuria _26 ; Nocturia_27   Vascular: Pain in legs with walking _28 ; Pain in feet with lying flat _29 ; Non-healing sores _30 ; Stroke _31 ; TIA _32 ; Slurred speech _33 ;  Neuro: Headaches_34 ; Vertigo_35 ; Seizures_36 ; Paresthesias_37 ;Blurred vision _38 ; Diplopia _39 ; Vision changes _40   Ortho/Skin: Arthritis [y]; Joint pain [y]; Muscle pain _41 ; Joint swelling _42 ; Back Pain _43 ; Rash _44   Psych: Depression_45 ; Anxiety_46   Heme: Bleeding problems _47 ; Clotting disorders _48 ; Anemia _49   Endocrine: Diabetes _50 ; Thyroid dysfunction_51    Home Medications Prior to Admission medications     Medication Sig Start Date End Date Taking? Authorizing Provider  ambrisentan (LETAIRIS) 10 MG tablet Take 1 tablet (10 mg total) by mouth daily. 04/11/15   Larey Dresser, MD  Amino Acids-Protein Hydrolys (FEEDING SUPPLEMENT, PRO-STAT SUGAR FREE 64,) LIQD Take 30 mLs by mouth 3 (three) times daily between meals. 09/24/15   Asencion Partridge, MD  amiodarone (PACERONE) 200 MG tablet Take 1 tablet (200 mg total) by mouth daily. 08/20/15   Larey Dresser, MD  esomeprazole (NEXIUM) 40 MG capsule Take 40 mg by mouth daily at 12 noon.     Historical Provider, MD  feeding supplement (BOOST / RESOURCE BREEZE) LIQD Take 1 Container by mouth 3 (three) times daily between meals. 08/30/15   Zada Finders, MD  fenofibrate 54 MG tablet Take 1 tablet (54 mg total) by mouth daily. 04/07/15   Sid Falcon, MD  FLUoxetine (PROZAC) 20 MG capsule Take 1 capsule (20 mg total) by mouth every morning. 06/18/15   Sid Falcon, MD  HYDROcodone-acetaminophen (NORCO) 10-325 MG tablet Take 1 tablet by mouth every 6 (six) hours. pain 06/17/15   Historical Provider, MD  hydrOXYzine (ATARAX/VISTARIL) 10 MG tablet Take 1 tablet (10 mg total) by mouth 2 (two) times daily as needed for itching. 09/17/15   Sid Falcon, MD  levothyroxine (SYNTHROID, LEVOTHROID) 25 MCG tablet Take 1 tablet (  25 mcg total) by mouth daily before breakfast. 12/28/14   Eileen Stanford, PA-C  metolazone (ZAROXOLYN) 2.5 MG tablet Take 2.5 mg by mouth 2 (two) times a week. Tuesdays and Fridays 09/02/15   Historical Provider, MD  nortriptyline (PAMELOR) 10 MG capsule Take 2 capsules (20 mg total) by mouth at bedtime. 03/19/15   Dennie Bible, NP  potassium chloride SA (K-DUR,KLOR-CON) 20 MEQ tablet Take 20 mEq by mouth daily. Take an additional 20 mEq when taking Metolazone    Historical Provider, MD  pregabalin (LYRICA) 150 MG capsule Take 1 capsule (150 mg total) by mouth 3 (three) times daily. 03/19/15   Dennie Bible, NP  Riociguat (ADEMPAS) 2.5 MG TABS  Take by mouth.    Historical Provider, MD  Selexipag (UPTRAVI) 1600 MCG TABS Take 1,600 mcg by mouth 2 (two) times daily. 10/22/14   Jolaine Artist, MD  torsemide (DEMADEX) 20 MG tablet Take 3 tablets (60 mg total) by mouth 2 (two) times daily. 10/01/15   Larey Dresser, MD    Past Medical History: Past Medical History:  Diagnosis Date  . Anemia    a.  hx of GI bleed and duodenal AVMs  . Angiodysplasia of colon with hemorrhage   . Arthritis   . Chronic diastolic CHF (congestive heart failure) ([place])    a. Echo 12/23/14 withEF 60-65%, moderately dilated RV, PASP 72, no pericardial effusion.  . Chronic kidney disease   . Dysrhythmia   . Gastroparesis   . GERD (gastroesophageal reflux disease)   . Gout   . Hypothyroidism    a. may be due to amiodarone use. started on synthroid on 12/2014 admission   . Obesity   . PAF (paroxysmal atrial fibrillation) (HCC)    a. not a long term AC candidate due to GI bleeds  . Peripheral neuropathy ([place])   . Scleroderma ([place])   . Secondary pulmonary hypertension ([place])    a. 2/2 scleroderma  . Sickle cell trait ([place])   . Systemic sclerosis ([place])   . Transfusion history    last 06-21-15  . Trichomonas   . Type II diabetes mellitus (HCC)    a. diet control   . Unspecified essential hypertension     Past Surgical History: Past Surgical History:  Procedure Laterality Date  . ABDOMINAL HYSTERECTOMY  1983   "partial"  . CARDIAC CATHETERIZATION Right 04/24/2004  . CARDIAC CATHETERIZATION Left 07/2010  . COLONOSCOPY  09/06/2008, 09/2013   int rrhoids:Dr. Lajoyce Corners 2010. Pan-colonic AVMs, microscopic colitis per Dr Deatra Ina 2015  . COLONOSCOPY N/A 02/24/2015   Procedure: COLONOSCOPY;  Surgeon: Gatha Mayer, MD;  Location: [facility];  Service: Endoscopy;  Laterality: N/A;  . ENTEROSCOPY N/A 05/14/2015   Procedure: ENTEROSCOPY;  Surgeon: Irene Shipper, MD;  Location: [facility] ENDOSCOPY;  Service: Endoscopy;  Laterality: N/A;  . ENTEROSCOPY N/A 06/22/2015    Procedure: ENTEROSCOPY;  Surgeon: Doran Stabler, MD;  Location: [facility] ENDOSCOPY;  Service: Endoscopy;  Laterality: N/A;  . ESOPHAGOGASTRODUODENOSCOPY  02/23/2010   D 2 AVM, ablated with APC.   Marland Kitchen ESOPHAGOGASTRODUODENOSCOPY N/A 03/23/2014   Procedure: ESOPHAGOGASTRODUODENOSCOPY (EGD);  Surgeon: Ladene Artist, MD;  Location: [facility] ENDOSCOPY;  Service: Endoscopy;  Laterality: N/A;  . ESOPHAGOGASTRODUODENOSCOPY N/A 02/21/2015   Procedure: ESOPHAGOGASTRODUODENOSCOPY (EGD);  Surgeon: Ladene Artist, MD;  Location: [facility] ENDOSCOPY;  Service: Endoscopy;  Laterality: N/A;  . EXCISIONAL TOTAL KNEE ARTHROPLASTY WITH ANTIBIOTIC SPACERS Left 08/2010   "got infected & had to take 1st replacement  out"  . HOT HEMOSTASIS  06/22/2015   Procedure: HOT HEMOSTASIS (ARGON PLASMA COAGULATION/BICAP);  Surgeon: Doran Stabler, MD;  Location: [facility];  Service: Endoscopy;;  . PERICARDIAL TAP N/A 05/03/2014   Procedure: PERICARDIAL TAP;  Surgeon: Sinclair Grooms, MD;  Location: [facility] CATH LAB;  Service: Cardiovascular;  Laterality: N/A;  . PERIPHERALLY INSERTED CENTRAL CATHETER INSERTION  09/2010  . REPLACEMENT TOTAL KNEE Left 11/2000  . REVISION TOTAL KNEE ARTHROPLASTY Left 11/2010   "removed spacers; replaced knee"  . RIGHT HEART CATHETERIZATION N/A 03/18/2014   Procedure: RIGHT HEART CATH;  Surgeon: Larey Dresser, MD;  Location: [facility] CATH LAB;  Service: Cardiovascular;  Laterality: N/A;  . SHOULDER ARTHROSCOPY Right 12/2009   subacromial decompression  . SHOULDER ARTHROSCOPY W/ ROTATOR CUFF REPAIR Right 01/2010  . TONSILLECTOMY  1960's  . TOTAL KNEE REVISION WITH SCAR DEBRIDEMENT/PATELLA REVISION WITH POLY EXCHANGE Left 12/2010   fell; knee split opened; had to redo revision"  . TUBAL LIGATION      Family History:  Family History  Problem Relation Age of Onset  . Heart disease Mother   . Diabetes Mother   . Diabetes Sister     Social History: Social History   Social History  . Marital status: Legally  Separated    Spouse name: N/A  . Number of children: 2  . Years of education: 11   Occupational History  . Umemployed Disability  . Disability     Social History Main Topics  . Smoking status: Never Smoker  . Smokeless tobacco: Never Used  . Alcohol use No  . Drug use: No  . Sexual activity: No   Other Topics Concern  . Not on file   Social History Narrative    FAMILY HISTORY:  Significant for coronary artery disease and diabetes   Patient lives at home with granddaughter.    Patient has 2 children.    Patient has 11 years of education.    Patient is on disability.    Patient is right handed.    Patient is separated.      Allergies:  Allergies  Allergen Reactions  . Morphine And Related Other (See Comments)    Listed as an allergy, per [facility]  . Cephalexin Rash  . Ciprofloxacin Rash  . Codeine Other (See Comments)     GI upset  . Contrast Media [Iodinated Diagnostic Agents] Hives  . Iohexol Hives     Code: HIVES, Desc: pt breaks out in large hives. needs full premeds, Onset Date: [id number]     Objective:    Vitals:   10/09/15 1450  BP: (!) 83/48  Pulse: 65  Resp: 16  SpO2: 99%  Weight: 154 lb (69.9 kg)      Wt Readings from Last 3 Encounters:  10/09/15 154 lb (69.9 kg)  10/03/15 152 lb 12.8 oz (69.3 kg)  09/17/15 157 lb 9.6 oz (71.5 kg)     PHYSICAL EXAM: General: Elderly and fatigued appearing. Chronically ill appearing.  Neck: JVP 14-16 + cm. No thyromegaly or thyroid nodule. Lungs: Diminished throughout. Basilar crackles.  CV: RV heave. Heart regular S1/S2 with widely split S2,  3/6 HSM LLSB. Tight edema to thighs and flanks. Unable to palpate pedal pulses.  Abdomen: soft, non-tender, moderately distended, no HSM. No bruits or masses. +BS  Neurologic: Alert and oriented x 3.  Psych: Affect flat. Extremities: No clubbing or cyanosis.   Labs: Basic Metabolic Panel:  Recent Labs Lab 10/03/15 1434  NA  139  K 4.1  CL 104  CO2 24    GLUCOSE 62*  BUN 45*  CREATININE 2.02*  CALCIUM 8.2*    Liver Function Tests: No results for input(s): AST, ALT, ALKPHOS, BILITOT, PROT, ALBUMIN in the last 168 hours. No results for input(s): LIPASE, AMYLASE in the last 168 hours. No results for input(s): AMMONIA in the last 168 hours.  CBC: No results for input(s): WBC, NEUTROABS, HGB, HCT, MCV, PLT in the last 168 hours.  Cardiac Enzymes: No results for input(s): CKTOTAL, CKMB, CKMBINDEX, TROPONINI in the last 168 hours.  BNP: BNP (last 3 results)  Recent Labs  03/27/15 1617 07/29/15 1304 10/03/15 1435  BNP 447.6* 589.8* 637.4*    ProBNP (last 3 results) No results for input(s): PROBNP in the last 8760 hours.   CBG: No results for input(s): GLUCAP in the last 168 hours.  Coagulation Studies: No results for input(s): LABPROT, INR in the last 72 hours.  Other results: EKG: pending admission  Imaging:  No results found.   Assessment/Plan   [name] is a 65 yo female with a history of DM2, HLD, HTN, pHTN, GERD, gastroparesis, scleroderma, DJD and diastolic HF admitted from clinic with intractable volume overload despite diuretic adjustment as outpatient.   1. 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  Echo 10/16  EF 60-65% with moderate dilated and mildly dysfunctional RV, severe TR. Pericardial effusion had resolved. Diuretics cut back with recent C difficile colitis, now weight is up and she is volume overloaded on exam with NYHA class IIIb symptoms.  Suspect weight would be up more from fluid but she has lost fat/muscle with poor nutritional intake recently.   - She has marked volume overload on exam. With failure of outpatient therapy will admit for diuresis and further evaluation.  - Will give 80 mg IV lasix q8 hrs to start. Will place PICC to monitor CVP.  2. PAH with cor pulmonale: Severe PAH.  Had last [place] in 1/16. Pulmonary pressure remained elevated by echo in 10/16. -  Continue ambrisentan 10 mg daily.  - Continue selexipag 1600 mcg bid.  - Hold riociguat with hypotension.  - No 6 minute walk today with ongoing CHF exacerbation.  3. CKD stage III - Labs today.  4. 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  She is no longer anticoagulated.  - CBC today.  5. Pericardial effusion: Resolved after pericardiocentesis, not seen on echo 12/2014.   6. Atrial fibrillation: Paroxysmal.  - Continue amiodarone. She will get a yearly eye exam while on amiodarone.  - CMET and TSH today.  - No anticoagulation with h/o GI bleeding.  7.  Hypothyroidism: May be related to amiodarone.   - On Levoxyl 25 mcg daily.   Should be followed by PCP.   Will get EKG and ECHO on admit. Will also do [place] with parecentesis to look for ascites.     Labs for admission attempted in clinic but very difficult access. Will get IV team consult stat on floor.   Shirley Friar, PA-C 10/09/2015  Advanced Heart Failure Team Pager [phone] (M-F; 7a - 4p)  Please contact [facility] for night-coverage after hours (4p -7a ) and weekends on [email or web address]  Patient seen with PA, agree with the above note.  She remains very volume overloaded despite increased diuretics.  Weight up 2 lbs.  Weak, soft blood pressure.   - Will given  Lasix 80 mg IV every 8 hrs to start.   - Place PICC to monitor CVP and co-ox => may need milrinone for RV support in setting of RV failure/pulmonary hypertension.  - Check CBC, history of GI bleeding/anemia.  - ECG to make sure she remains in NSR.  - Repeat echo, she has history of large pericardial effusion.  - Hold riociguat for now with low BP, continue ambrisentan and selexipag.   [name] 10/09/2015 3:17 PM

## 2015-10-09 NOTE — Progress Notes (Signed)
PIV 24g x 2 attempts inserted to RAW for direct admit to [place] from CHF clinic per [facility]. Patient tolerated well, saline locked.  Renee Pain

## 2015-10-10 ENCOUNTER — Inpatient Hospital Stay (HOSPITAL_COMMUNITY): Payer: Medicare Other

## 2015-10-10 DIAGNOSIS — I5033 Acute on chronic diastolic (congestive) heart failure: Secondary | ICD-10-CM

## 2015-10-10 DIAGNOSIS — I272 Other secondary pulmonary hypertension: Secondary | ICD-10-CM

## 2015-10-10 DIAGNOSIS — I509 Heart failure, unspecified: Secondary | ICD-10-CM

## 2015-10-10 LAB — IRON AND TIBC
IRON: 22 ug/dL — AB (ref 28–170)
SATURATION RATIOS: 14 % (ref 10.4–31.8)
TIBC: 160 ug/dL — AB (ref 250–450)
UIBC: 138 ug/dL

## 2015-10-10 LAB — CBC WITH DIFFERENTIAL/PLATELET
BASOS PCT: 1 %
Basophils Absolute: 0 10*3/uL (ref 0.0–0.1)
Eosinophils Absolute: 0.1 10*3/uL (ref 0.0–0.7)
Eosinophils Relative: 2 %
HEMATOCRIT: 23.1 % — AB (ref 36.0–46.0)
HEMOGLOBIN: 7.8 g/dL — AB (ref 12.0–15.0)
Lymphocytes Relative: 38 %
Lymphs Abs: 1.6 10*3/uL (ref 0.7–4.0)
MCH: 25 pg — AB (ref 26.0–34.0)
MCHC: 33.8 g/dL (ref 30.0–36.0)
MCV: 74 fL — AB (ref 78.0–100.0)
MONOS PCT: 12 %
Monocytes Absolute: 0.5 10*3/uL (ref 0.1–1.0)
NEUTROS PCT: 47 %
Neutro Abs: 1.9 10*3/uL (ref 1.7–7.7)
Platelets: 158 10*3/uL (ref 150–400)
RBC: 3.12 MIL/uL — AB (ref 3.87–5.11)
RDW: 22.1 % — ABNORMAL HIGH (ref 11.5–15.5)
WBC: 4.1 10*3/uL (ref 4.0–10.5)

## 2015-10-10 LAB — BASIC METABOLIC PANEL
Anion gap: 9 (ref 5–15)
BUN: 42 mg/dL — AB (ref 6–20)
CHLORIDE: 103 mmol/L (ref 101–111)
CO2: 27 mmol/L (ref 22–32)
CREATININE: 1.55 mg/dL — AB (ref 0.44–1.00)
Calcium: 8.5 mg/dL — ABNORMAL LOW (ref 8.9–10.3)
GFR calc non Af Amer: 34 mL/min — ABNORMAL LOW (ref >60)
GFR, EST AFRICAN AMERICAN: 40 mL/min — AB (ref >60)
Glucose, Bld: 79 mg/dL (ref 65–99)
POTASSIUM: 3.7 mmol/L (ref 3.5–5.1)
SODIUM: 139 mmol/L (ref 135–145)

## 2015-10-10 LAB — ECHOCARDIOGRAM COMPLETE
AO mean calculated velocity dopler: 125 cm/s
AOPV: 0.65 m/s
AOVTI: 43.7 cm
AV Area mean vel: 1.46 cm2
AV VEL mean LVOT/AV: 0.64
AV area mean vel ind: 0.84 cm2/m2
AV pk vel: 194 cm/s
AV vel: 1.45
AVA: 1.45 cm2
AVAREAVTI: 1.47 cm2
AVAREAVTIIND: 0.84 cm2/m2
AVG: 8 mmHg
AVPG: 15 mmHg
CHL CUP AV PEAK INDEX: 0.85
CHL CUP DOP CALC LVOT VTI: 28 cm
CHL CUP MV DEC (S): 229
CHL CUP TV REG PEAK VELOCITY: 436 cm/s
EWDT: 229 ms
FS: 31 % (ref 28–44)
Height: 64 in
IVS/LV PW RATIO, ED: 0.58
LA ID, A-P, ES: 42 mm
LA vol index: 50.3 mL/m2
LADIAMINDEX: 2.43 cm/m2
LAVOL: 87 mL
LAVOLA4C: 90.6 mL
LEFT ATRIUM END SYS DIAM: 42 mm
LV PW d: 12 mm — AB (ref 0.6–1.1)
LV TDI E'LATERAL: 5.33
LV TDI E'MEDIAL: 7.94
LVELAT: 5.33 cm/s
LVOT area: 2.27 cm2
LVOT peak VTI: 0.64 cm
LVOT peak grad rest: 6 mmHg
LVOT peak vel: 126 cm/s
LVOTD: 17 mm
LVOTSV: 64 mL
MV pk E vel: 2.6 m/s
TRMAXVEL: 436 cm/s
Valve area index: 0.84
Weight: 2400 oz

## 2015-10-10 LAB — FERRITIN: FERRITIN: 480 ng/mL — AB (ref 11–307)

## 2015-10-10 LAB — CARBOXYHEMOGLOBIN
Carboxyhemoglobin: 2 % — ABNORMAL HIGH (ref 0.5–1.5)
METHEMOGLOBIN: 0.8 % (ref 0.0–1.5)
O2 Saturation: 57.1 %
TOTAL HEMOGLOBIN: 7.9 g/dL — AB (ref 12.0–16.0)

## 2015-10-10 MED ORDER — RIOCIGUAT 2.5 MG PO TABS
2.5000 mg | ORAL_TABLET | Freq: Three times a day (TID) | ORAL | Status: DC
  Administered 2015-10-10 – 2015-10-15 (×14): 2.5 mg via ORAL
  Filled 2015-10-10 (×10): qty 1

## 2015-10-10 MED ORDER — SODIUM CHLORIDE 0.9% FLUSH
10.0000 mL | Freq: Two times a day (BID) | INTRAVENOUS | Status: DC
  Administered 2015-10-10 – 2015-10-15 (×10): 10 mL

## 2015-10-10 MED ORDER — FUROSEMIDE 10 MG/ML IJ SOLN
80.0000 mg | Freq: Once | INTRAMUSCULAR | Status: AC
  Administered 2015-10-10: 80 mg via INTRAVENOUS

## 2015-10-10 MED ORDER — AMBRISENTAN 5 MG PO TABS
10.0000 mg | ORAL_TABLET | Freq: Once | ORAL | Status: AC
Start: 2015-10-10 — End: 2015-10-10
  Administered 2015-10-10: 10 mg via ORAL
  Filled 2015-10-10: qty 2

## 2015-10-10 MED ORDER — SODIUM CHLORIDE 0.9% FLUSH: 10.0000 mL | INTRAVENOUS | Status: DC | PRN

## 2015-10-10 NOTE — Progress Notes (Signed)
Lasix dose held at 0600 due to soft BP's with Dr. Algernon Huxley aware this am.

## 2015-10-10 NOTE — Progress Notes (Signed)
PICC placement scheduled for tomorrow per IV team.  Additional PIV placed during IV team consult at 2100.  Huddle with nursing team, Prescott Gum, Lead RN, before urinary catheter placement per order.  Will continue to monitor.

## 2015-10-10 NOTE — Progress Notes (Signed)
  Echocardiogram 2D Echocardiogram has been performed.  [name] 10/10/2015, 1:24 PM

## 2015-10-10 NOTE — Progress Notes (Signed)
Peripherally Inserted Central Catheter/Midline Placement  The IV Nurse has discussed with the patient and/or persons authorized to consent for the patient, the purpose of this procedure and the potential benefits and risks involved with this procedure.  The benefits include less needle sticks, lab draws from the catheter and patient may be discharged home with the catheter.  Risks include, but not limited to, infection, bleeding, blood clot (thrombus formation), and puncture of an artery; nerve damage and irregular heat beat.  Alternatives to this procedure were also discussed.  PICC/Midline Placement Documentation        Henderson [place] 10/10/2015, 10:10 AM Unable to arouse patient so phone consent obtained from sister  Jorene Guest.

## 2015-10-10 NOTE — Progress Notes (Addendum)
Patient ID: [name], female   DOB: 12/11/1950, 65 y.o.   MRN: [id number]     SUBJECTIVE: Stable overnight, diuresed some with weight down 2 lbs.  No complaints, sleeping this morning.  No diarrhea. Hemoglobin lower today but no overt bleeding.   Scheduled Meds: . ambrisentan  10 mg Oral Daily  . amiodarone  200 mg Oral Daily  . enoxaparin (LOVENOX) injection  30 mg Subcutaneous Q24H  . feeding supplement  1 Container Oral TID BM  . feeding supplement (PRO-STAT SUGAR FREE 64)  30 mL Oral TID BM  . fenofibrate  54 mg Oral Daily  . FLUoxetine  20 mg Oral q morning - 10a  . furosemide  80 mg Intravenous Q8H  . levothyroxine  25 mcg Oral QAC breakfast  . nortriptyline  20 mg Oral QHS  . pantoprazole  40 mg Oral Daily  . potassium chloride  20 mEq Oral Daily  . pregabalin  150 mg Oral TID  . Selexipag  1,600 mcg Oral BID  . sodium chloride flush  3 mL Intravenous Q12H   Continuous Infusions:  PRN Meds:.sodium chloride, acetaminophen, HYDROcodone-acetaminophen, hydrOXYzine, ondansetron (ZOFRAN) IV, sodium chloride flush    Vitals:   10/10/15 0403 10/10/15 0500 10/10/15 0504 10/10/15 0600  BP:  (!) 89/46 (!) 99/48 (!) 91/53  Pulse:  (!) 58 (!) 58 61  Resp:  _0 Temp: 97.6 F (36.4 C)     TempSrc: Oral     SpO2:  95% 98% 95%  Weight:      Height:        Intake/Output Summary (Last 24 hours) at 10/10/15 0738 Last data filed at 10/10/15 0600  Gross per 24 hour  Intake              510 ml  Output             1950 ml  Net            -1440 ml    LABS: Basic Metabolic Panel:  Recent Labs  10/09/15 1818 10/10/15 0249  NA 140 139  K 3.9 3.7  CL 105 103  CO2 26 27  GLUCOSE 67 79  BUN 46* 42*  CREATININE 1.78* 1.55*  CALCIUM 8.4* 8.5*   Liver Function Tests:  Recent Labs  10/09/15 1818  AST 16  ALT 7*  ALKPHOS 122  BILITOT 0.8  PROT 7.1  ALBUMIN 2.7*   No results for input(s): LIPASE, AMYLASE in the last 72 hours. CBC:  Recent Labs   10/09/15 1818 10/10/15 0249  WBC 4.8 4.1  NEUTROABS 3.2 1.9  HGB 8.1* 7.8*  HCT 24.9* 23.1*  MCV 75.5* 74.0*  PLT 106* 158   Cardiac Enzymes: No results for input(s): CKTOTAL, CKMB, CKMBINDEX, TROPONINI in the last 72 hours. BNP: Invalid input(s): POCBNP D-Dimer: No results for input(s): DDIMER in the last 72 hours. Hemoglobin A1C: No results for input(s): HGBA1C in the last 72 hours. Fasting Lipid Panel: No results for input(s): CHOL, HDL, LDLCALC, TRIG, CHOLHDL, LDLDIRECT in the last 72 hours. Thyroid Function Tests:  Recent Labs  10/09/15 1818  TSH 4.320   Anemia Panel: No results for input(s): VITAMINB12, FOLATE, FERRITIN, TIBC, IRON, RETICCTPCT in the last 72 hours.  RADIOLOGY: Ct Abdomen Pelvis Wo Contrast  Result Date: 09/22/2015 CLINICAL DATA:  Right lower quadrant pain today. EXAM: CT ABDOMEN AND PELVIS WITHOUT CONTRAST TECHNIQUE: Multidetector CT imaging of the abdomen and pelvis was performed following the standard protocol  without IV contrast. COMPARISON:  CT abdomen and pelvis 08/26/2015 and 05/12/2015. FINDINGS: Marked cardiomegaly is seen as on the prior study. No pleural or pericardial effusion. There is some dependent atelectasis in the lung bases. High attenuation in the gallbladder is new since the most recent examination may be due to the presence of sludge or possibly vicarious excretion of contrast if the patient has had an exam with IV contrast. There is a new small volume of abdominal and pelvic ascites. Colonic wall thickening seen on the prior examination appears worse, particularly in the ascending and transverse colon. The stomach and small bowel are unremarkable. Scattered cysts in the liver noted, unchanged. The spleen, adrenal glands and biliary tree are unremarkable. There is some atrophy of otherwise unremarkable kidneys. There is also some pancreatic atrophy. No focal bony abnormality is identified. IMPRESSION: Since the most recent CT examination.  Changes of colitis appear worse, particularly in the ascending and transverse colon. There is also a new small volume of abdominal and pelvic ascites without focal fluid collection. Increased attenuation within the gallbladder may be due to the presence of sludge or could be vicarious excretion of contrast if the patient has had a study with IV contrast. No evidence of cholecystitis is identified. Electronically Signed   By: Inge Rise M.D.   On: 09/22/2015 19:46    PHYSICAL EXAM General: NAD Neck: JVP 14 cm, no thyromegaly or thyroid nodule.  Lungs: Decreased breath sounds at bases. CV: Nondisplaced PMI.  Heart regular S1/S2, no S3/S4, no murmur. 1+ edema 1/2 up lower legs bilaterally.   Abdomen: Soft, nontender, no hepatosplenomegaly, mild-moderate distention.  Neurologic: Alert and oriented x 3.  Psych: Normal affect. Extremities: No clubbing or cyanosis.   TELEMETRY: Reviewed telemetry pt in NSR  ASSESSMENT AND PLAN: 1. 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  Echo 10/16  EF 60-65% with moderate dilated and mildly dysfunctional RV, severe TR. Pericardial effusion had resolved. Diuretics cut back with recent C difficile colitis, now weight is up and she is volume overloaded on exam with NYHA class IIIb symptoms.  She was admitted 7/27 with marked volume overload on exam.  She is diuresing now and BP is stable (runs upper 80s-90s baseline).  - Continue 80 mg IV lasix q8 hrs.  - Will place PICC to monitor CVP/co-ox.  - Will get repeat echo to see if pericardial effusion has re-accumulated.  - Significant abdominal distention => will send for abdominal US, will do paracentesis if significant fluid.  2. PAH with cor pulmonale: Severe PAH.  Had last [place] in 1/16. Pulmonary pressure remained elevated by echo in 10/16. - Continue ambrisentan 10 mg daily.  - Continue selexipag 1600 mcg bid.  - BP at baseline, can restart riociguat 2.5 tid.   3. CKD stage III:  Creatinine stable at her baseline.  4. 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  She is no longer anticoagulated.  Hemoglobin lower today, does not report overt bleeding.  - Will check iron stores, will give IV Fe if low.  5. Pericardial effusion: Resolved after pericardiocentesis, not seen on echo 12/2014.  Repeat echo this admission.  6. Atrial fibrillation: Paroxysmal, she is in NSR.  - Continue amiodarone.  - No anticoagulation with h/o recurrent GI bleeding.  7.  Hypothyroidism: May be related to amiodarone.   - On Levoxyl 25 mcg daily.   Should be followed by PCP.  8. H/o C difficile colitis: No diarrhea, 2 prior hospitalizations were with this.   [name] 10/10/2015 7:45 AM

## 2015-10-11 ENCOUNTER — Other Ambulatory Visit (HOSPITAL_COMMUNITY): Payer: Medicare Other

## 2015-10-11 LAB — CBC WITH DIFFERENTIAL/PLATELET
BASOS PCT: 0 %
Basophils Absolute: 0 10*3/uL (ref 0.0–0.1)
EOS PCT: 1 %
Eosinophils Absolute: 0 10*3/uL (ref 0.0–0.7)
HCT: 22.6 % — ABNORMAL LOW (ref 36.0–46.0)
Hemoglobin: 7.2 g/dL — ABNORMAL LOW (ref 12.0–15.0)
LYMPHS ABS: 1.3 10*3/uL (ref 0.7–4.0)
Lymphocytes Relative: 35 %
MCH: 24.3 pg — AB (ref 26.0–34.0)
MCHC: 31.9 g/dL (ref 30.0–36.0)
MCV: 76.4 fL — AB (ref 78.0–100.0)
MONO ABS: 0.4 10*3/uL (ref 0.1–1.0)
Monocytes Relative: 12 %
NEUTROS ABS: 1.9 10*3/uL (ref 1.7–7.7)
Neutrophils Relative %: 52 %
PLATELETS: 158 10*3/uL (ref 150–400)
RBC: 2.96 MIL/uL — ABNORMAL LOW (ref 3.87–5.11)
RDW: 22 % — AB (ref 11.5–15.5)
WBC: 3.6 10*3/uL — ABNORMAL LOW (ref 4.0–10.5)

## 2015-10-11 LAB — CARBOXYHEMOGLOBIN
Carboxyhemoglobin: 2.1 % — ABNORMAL HIGH (ref 0.5–1.5)
Methemoglobin: 1.1 % (ref 0.0–1.5)
O2 Saturation: 61.3 %
Total hemoglobin: 7.7 g/dL — ABNORMAL LOW (ref 12.0–16.0)

## 2015-10-11 LAB — BASIC METABOLIC PANEL
Anion gap: 10 (ref 5–15)
BUN: 37 mg/dL — AB (ref 6–20)
CHLORIDE: 101 mmol/L (ref 101–111)
CO2: 26 mmol/L (ref 22–32)
CREATININE: 1.79 mg/dL — AB (ref 0.44–1.00)
Calcium: 8.6 mg/dL — ABNORMAL LOW (ref 8.9–10.3)
GFR, EST AFRICAN AMERICAN: 33 mL/min — AB (ref >60)
GFR, EST NON AFRICAN AMERICAN: 29 mL/min — AB (ref >60)
Glucose, Bld: 93 mg/dL (ref 65–99)
Potassium: 4.1 mmol/L (ref 3.5–5.1)
SODIUM: 137 mmol/L (ref 135–145)

## 2015-10-11 MED ORDER — POTASSIUM CHLORIDE CRYS ER 20 MEQ PO TBCR
20.0000 meq | EXTENDED_RELEASE_TABLET | Freq: Two times a day (BID) | ORAL | Status: DC
  Administered 2015-10-11 – 2015-10-12 (×2): 20 meq via ORAL
  Filled 2015-10-11 (×2): qty 1

## 2015-10-11 MED ORDER — SODIUM CHLORIDE 0.9 % IV SOLN
510.0000 mg | Freq: Once | INTRAVENOUS | Status: AC
  Administered 2015-10-11: 510 mg via INTRAVENOUS
  Filled 2015-10-11 (×2): qty 17

## 2015-10-11 MED ORDER — AMBRISENTAN 5 MG PO TABS
10.0000 mg | ORAL_TABLET | Freq: Every day | ORAL | Status: DC
  Administered 2015-10-11 – 2015-10-14 (×4): 10 mg via ORAL
  Filled 2015-10-11 (×2): qty 2

## 2015-10-11 MED ORDER — AMBRISENTAN 5 MG PO TABS
10.0000 mg | ORAL_TABLET | Freq: Every day | ORAL | Status: DC
  Filled 2015-10-11: qty 2

## 2015-10-11 MED ORDER — POTASSIUM CHLORIDE CRYS ER 20 MEQ PO TBCR
40.0000 meq | EXTENDED_RELEASE_TABLET | Freq: Once | ORAL | Status: AC
  Administered 2015-10-11: 40 meq via ORAL
  Filled 2015-10-11: qty 2

## 2015-10-11 NOTE — Progress Notes (Addendum)
Patient ID: [name], female   DOB: Apr 19, 1950, 65 y.o.   MRN: [id number]   ADVANCED HF ROUNDING NOTE  SUBJECTIVE:   Diuresing well. Weight down 5 pounds. CVP still 15. Co-ox 61%  Feels better. Appetite improved.  No dyspnea  Echo reviewed personally. LVEF 60%  RV dilated/HK. RVSP 91. Trivial effusion  AB u/s minimal ascites    Scheduled Meds: . ambrisentan  10 mg Oral Daily  . amiodarone  200 mg Oral Daily  . enoxaparin (LOVENOX) injection  30 mg Subcutaneous Q24H  . feeding supplement  1 Container Oral TID BM  . feeding supplement (PRO-STAT SUGAR FREE 64)  30 mL Oral TID BM  . fenofibrate  54 mg Oral Daily  . FLUoxetine  20 mg Oral q morning - 10a  . furosemide  80 mg Intravenous Q8H  . levothyroxine  25 mcg Oral QAC breakfast  . nortriptyline  20 mg Oral QHS  . pantoprazole  40 mg Oral Daily  . potassium chloride  20 mEq Oral Daily  . pregabalin  150 mg Oral TID  . Riociguat  2.5 mg Oral Q8H  . Selexipag  1,600 mcg Oral BID  . sodium chloride flush  10-40 mL Intracatheter Q12H  . sodium chloride flush  3 mL Intravenous Q12H   Continuous Infusions:  PRN Meds:.sodium chloride, acetaminophen, HYDROcodone-acetaminophen, hydrOXYzine, ondansetron (ZOFRAN) IV, sodium chloride flush, sodium chloride flush    Vitals:   10/11/15 0004 10/11/15 0358 10/11/15 0854 10/11/15 1105  BP: (!) 103/53 (!) 90/51 (!) 93/53 (!) 97/54  Pulse: (!) 57 (!) 58 (!) 59 61  Resp: _0 Temp: 98.6 F (37 C) 98.4 F (36.9 C) 97.9 F (36.6 C) 98 F (36.7 C)  TempSrc: Oral Oral  Oral  SpO2: 97% 91% 97% 97%  Weight:  66.1 kg (145 lb 11.6 oz)    Height:        Intake/Output Summary (Last 24 hours) at 10/11/15 1328 Last data filed at 10/11/15 0856  Gross per 24 hour  Intake              540 ml  Output             3350 ml  Net            -2810 ml    LABS: Basic Metabolic Panel:  Recent Labs  10/09/15 1818 10/10/15 0249  NA 140 139  K 3.9 3.7  CL 105 103  CO2 26 27    GLUCOSE 67 79  BUN 46* 42*  CREATININE 1.78* 1.55*  CALCIUM 8.4* 8.5*   Liver Function Tests:  Recent Labs  10/09/15 1818  AST 16  ALT 7*  ALKPHOS 122  BILITOT 0.8  PROT 7.1  ALBUMIN 2.7*   No results for input(s): LIPASE, AMYLASE in the last 72 hours. CBC:  Recent Labs  10/10/15 0249 10/11/15 0500  WBC 4.1 3.6*  NEUTROABS 1.9 1.9  HGB 7.8* 7.2*  HCT 23.1* 22.6*  MCV 74.0* 76.4*  PLT 158 158   Cardiac Enzymes: No results for input(s): CKTOTAL, CKMB, CKMBINDEX, TROPONINI in the last 72 hours. BNP: Invalid input(s): POCBNP D-Dimer: No results for input(s): DDIMER in the last 72 hours. Hemoglobin A1C: No results for input(s): HGBA1C in the last 72 hours. Fasting Lipid Panel: No results for input(s): CHOL, HDL, LDLCALC, TRIG, CHOLHDL, LDLDIRECT in the last 72 hours. Thyroid Function Tests:  Recent Labs  10/09/15 1818  TSH 4.320   Anemia Panel:  Recent Labs  10/10/15 1015  FERRITIN 480*  TIBC 160*  IRON 22*    RADIOLOGY: Ct Abdomen Pelvis Wo Contrast  Result Date: 09/22/2015 CLINICAL DATA:  Right lower quadrant pain today. EXAM: CT ABDOMEN AND PELVIS WITHOUT CONTRAST TECHNIQUE: Multidetector CT imaging of the abdomen and pelvis was performed following the standard protocol without IV contrast. COMPARISON:  CT abdomen and pelvis 08/26/2015 and 05/12/2015. FINDINGS: Marked cardiomegaly is seen as on the prior study. No pleural or pericardial effusion. There is some dependent atelectasis in the lung bases. High attenuation in the gallbladder is new since the most recent examination may be due to the presence of sludge or possibly vicarious excretion of contrast if the patient has had an exam with IV contrast. There is a new small volume of abdominal and pelvic ascites. Colonic wall thickening seen on the prior examination appears worse, particularly in the ascending and transverse colon. The stomach and small bowel are unremarkable. Scattered cysts in the liver  noted, unchanged. The spleen, adrenal glands and biliary tree are unremarkable. There is some atrophy of otherwise unremarkable kidneys. There is also some pancreatic atrophy. No focal bony abnormality is identified. IMPRESSION: Since the most recent CT examination. Changes of colitis appear worse, particularly in the ascending and transverse colon. There is also a new small volume of abdominal and pelvic ascites without focal fluid collection. Increased attenuation within the gallbladder may be due to the presence of sludge or could be vicarious excretion of contrast if the patient has had a study with IV contrast. No evidence of cholecystitis is identified. Electronically Signed   By: Inge Rise M.D.   On: 09/22/2015 19:46   US Abdomen Limited  Result Date: 10/10/2015 CLINICAL DATA:  Ascites EXAM: LIMITED ABDOMEN ULTRASOUND FOR ASCITES TECHNIQUE: Limited ultrasound survey for ascites was performed in all four abdominal quadrants. COMPARISON:  CT scan 09/22/2015. FINDINGS: Four quadrant evaluation of the abdomen reveals a small amount of fluid around the liver with trace fluid in the right lower quadrant. IMPRESSION: Insufficient fluid for paracentesis. Electronically Signed   By: Misty Stanley M.D.   On: 10/10/2015 12:01  Dg Chest Port 1 View  Result Date: 10/10/2015 CLINICAL DATA:  Diabetes, CHF EXAM: PORTABLE CHEST 1 VIEW COMPARISON:  08/26/2015 FINDINGS: Stable cardiomegaly with slight vascular and interstitial prominence suggesting mild edema. Minor basilar atelectasis. No large effusion or pneumothorax. Trachea is midline. Overall stable exam. IMPRESSION: Stable cardiomegaly with vascular congestion versus early edema. Basilar atelectasis. Electronically Signed   By: Jerilynn Mages.  Shick M.D.   On: 10/10/2015 08:11   PHYSICAL EXAM General: NAD Neck: JVP 14 cm, no thyromegaly or thyroid nodule.  Lungs: Decreased breath sounds at bases. CV: Nondisplaced PMI.  Heart regular S1/S2, no S3/S4, no murmur.  1+ edema 1/2 up lower legs bilaterally.   Abdomen: Soft, nontender, no hepatosplenomegaly, mild-moderate distention.  Neurologic: Alert and oriented x 3.  Psych: Normal affect. Extremities: No clubbing or cyanosis.   TELEMETRY: Reviewed telemetry pt in NSR  ASSESSMENT AND PLAN: 1. 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  Echo 10/16  EF 60-65% with moderate dilated and mildly dysfunctional RV, severe TR. Pericardial effusion had resolved. Diuretics cut back with recent C difficile colitis, now weight is up and she is volume overloaded on exam with NYHA class IIIb symptoms.  She was admitted 7/27 with marked volume overload on exam.  She is diuresing now and BP is stable (runs upper 80s-90s baseline).  -  No significant ascites on ab u/s. Trivial effusion on echo.  - CVP up. Co-ox ok.  - Continue 80 mg IV lasix q8 hrs. Weight 145 is below baseline but will diurese as BP and renal function tolerates.  - Needs BMET today 2. PAH with cor pulmonale: Severe PAH.  Had last [place] in 1/16. Pulmonary pressure remained elevated by echo in 10/16. - Continue ambrisentan 10 mg daily.  - Continue selexipag 1600 mcg bid.  - Continue riociguat 2.5 tid.   - Consider repeat RHC in near future 3. CKD stage III: Creatinine stable at her baseline.  4. 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  She is no longer anticoagulated.  Hemoglobin lower today, does not report overt bleeding.  - Iron stores low, will give IV Fe  -Get T&S in am for probable transfusion  5. Pericardial effusion: Resolved after pericardiocentesis, not seen on echo 12/2014.  Repeat echo this admission with trivial effusion.  6. Atrial fibrillation: Paroxysmal, she is in NSR.  - Continue amiodarone.  - No anticoagulation with h/o recurrent GI bleeding.  7.  Hypothyroidism: May be related to amiodarone.   - On Levoxyl 25 mcg daily.   Should be followed by PCP.  8. H/o C  difficile colitis: No diarrhea, 2 prior hospitalizations were with this.   Glori Bickers MD 10/11/2015 1:28 PM

## 2015-10-12 ENCOUNTER — Other Ambulatory Visit (HOSPITAL_COMMUNITY): Payer: Medicare Other

## 2015-10-12 DIAGNOSIS — D649 Anemia, unspecified: Secondary | ICD-10-CM

## 2015-10-12 LAB — CBC WITH DIFFERENTIAL/PLATELET
Basophils Absolute: 0 10*3/uL (ref 0.0–0.1)
Basophils Relative: 1 %
EOS PCT: 1 %
Eosinophils Absolute: 0 10*3/uL (ref 0.0–0.7)
HCT: 22 % — ABNORMAL LOW (ref 36.0–46.0)
HEMOGLOBIN: 7.4 g/dL — AB (ref 12.0–15.0)
LYMPHS PCT: 38 %
Lymphs Abs: 1.4 10*3/uL (ref 0.7–4.0)
MCH: 25.2 pg — ABNORMAL LOW (ref 26.0–34.0)
MCHC: 33.6 g/dL (ref 30.0–36.0)
MCV: 74.8 fL — AB (ref 78.0–100.0)
MONO ABS: 0.4 10*3/uL (ref 0.1–1.0)
MONOS PCT: 10 %
NEUTROS PCT: 50 %
Neutro Abs: 1.8 10*3/uL (ref 1.7–7.7)
PLATELETS: 149 10*3/uL — AB (ref 150–400)
RBC: 2.94 MIL/uL — AB (ref 3.87–5.11)
RDW: 21.8 % — ABNORMAL HIGH (ref 11.5–15.5)
WBC: 3.6 10*3/uL — ABNORMAL LOW (ref 4.0–10.5)

## 2015-10-12 LAB — BASIC METABOLIC PANEL
ANION GAP: 7 (ref 5–15)
BUN: 39 mg/dL — ABNORMAL HIGH (ref 6–20)
CHLORIDE: 104 mmol/L (ref 101–111)
CO2: 26 mmol/L (ref 22–32)
Calcium: 8.6 mg/dL — ABNORMAL LOW (ref 8.9–10.3)
Creatinine, Ser: 1.78 mg/dL — ABNORMAL HIGH (ref 0.44–1.00)
GFR calc non Af Amer: 29 mL/min — ABNORMAL LOW (ref >60)
GFR, EST AFRICAN AMERICAN: 34 mL/min — AB (ref >60)
Glucose, Bld: 80 mg/dL (ref 65–99)
Potassium: 4.5 mmol/L (ref 3.5–5.1)
Sodium: 137 mmol/L (ref 135–145)

## 2015-10-12 LAB — PREPARE RBC (CROSSMATCH)

## 2015-10-12 LAB — CARBOXYHEMOGLOBIN
CARBOXYHEMOGLOBIN: 1.7 % — AB (ref 0.5–1.5)
METHEMOGLOBIN: 1.5 % (ref 0.0–1.5)
O2 Saturation: 60.1 %
Total hemoglobin: 7.6 g/dL — ABNORMAL LOW (ref 12.0–16.0)

## 2015-10-12 MED ORDER — METOLAZONE 2.5 MG PO TABS
2.5000 mg | ORAL_TABLET | Freq: Every day | ORAL | Status: DC
  Administered 2015-10-12: 2.5 mg via ORAL
  Filled 2015-10-12 (×2): qty 1

## 2015-10-12 MED ORDER — SODIUM CHLORIDE 0.9 % IV SOLN: Freq: Once | INTRAVENOUS | Status: DC

## 2015-10-12 NOTE — Progress Notes (Signed)
Patient ID: [name], female   DOB: 1951-01-08, 65 y.o.   MRN: [id number]   ADVANCED HF ROUNDING NOTE  SUBJECTIVE:   Diuresing well. But CVP still 15. Eating lots of ice.  Weight up 3 pounds (on bed scale) Co-ox 60%  Feels better.  No dyspnea  Echo reviewed personally. LVEF 60%  RV dilated/HK. RVSP 91. Trivial effusion  AB u/s minimal ascites    Scheduled Meds: . ambrisentan  10 mg Oral Daily  . amiodarone  200 mg Oral Daily  . enoxaparin (LOVENOX) injection  30 mg Subcutaneous Q24H  . feeding supplement  1 Container Oral TID BM  . feeding supplement (PRO-STAT SUGAR FREE 64)  30 mL Oral TID BM  . fenofibrate  54 mg Oral Daily  . FLUoxetine  20 mg Oral q morning - 10a  . furosemide  80 mg Intravenous Q8H  . levothyroxine  25 mcg Oral QAC breakfast  . nortriptyline  20 mg Oral QHS  . pantoprazole  40 mg Oral Daily  . potassium chloride  20 mEq Oral BID  . pregabalin  150 mg Oral TID  . Riociguat  2.5 mg Oral Q8H  . Selexipag  1,600 mcg Oral BID  . sodium chloride flush  10-40 mL Intracatheter Q12H  . sodium chloride flush  3 mL Intravenous Q12H   Continuous Infusions:  PRN Meds:.sodium chloride, acetaminophen, HYDROcodone-acetaminophen, hydrOXYzine, ondansetron (ZOFRAN) IV, sodium chloride flush, sodium chloride flush    Vitals:   10/12/15 0357 10/12/15 0800 10/12/15 1138 10/12/15 1200  BP: (!) 96/54 (!) 91/57 (!) 92/49   Pulse: (!) 59 62    Resp: _0 Temp: 98.3 F (36.8 C) 98.4 F (36.9 C) 98.2 F (36.8 C)   TempSrc: Oral Oral Oral   SpO2: 92% 91%  94%  Weight: 67.3 kg (148 lb 5.9 oz)     Height:        Intake/Output Summary (Last 24 hours) at 10/12/15 1414 Last data filed at 10/12/15 0900  Gross per 24 hour  Intake              120 ml  Output             2425 ml  Net            -2305 ml    LABS: Basic Metabolic Panel:  Recent Labs  10/11/15 1520 10/12/15 0500  NA 137 137  K 4.1 4.5  CL 101 104  CO2 26 26  GLUCOSE 93 80  BUN 37* 39*    CREATININE 1.79* 1.78*  CALCIUM 8.6* 8.6*   Liver Function Tests:  Recent Labs  10/09/15 1818  AST 16  ALT 7*  ALKPHOS 122  BILITOT 0.8  PROT 7.1  ALBUMIN 2.7*   No results for input(s): LIPASE, AMYLASE in the last 72 hours. CBC:  Recent Labs  10/11/15 0500 10/12/15 0500  WBC 3.6* 3.6*  NEUTROABS 1.9 1.8  HGB 7.2* 7.4*  HCT 22.6* 22.0*  MCV 76.4* 74.8*  PLT 158 149*   Cardiac Enzymes: No results for input(s): CKTOTAL, CKMB, CKMBINDEX, TROPONINI in the last 72 hours. BNP: Invalid input(s): POCBNP D-Dimer: No results for input(s): DDIMER in the last 72 hours. Hemoglobin A1C: No results for input(s): HGBA1C in the last 72 hours. Fasting Lipid Panel: No results for input(s): CHOL, HDL, LDLCALC, TRIG, CHOLHDL, LDLDIRECT in the last 72 hours. Thyroid Function Tests:  Recent Labs  10/09/15 1818  TSH 4.320   Anemia  Panel:  Recent Labs  10/10/15 1015  FERRITIN 480*  TIBC 160*  IRON 22*    RADIOLOGY: Ct Abdomen Pelvis Wo Contrast  Result Date: 09/22/2015 CLINICAL DATA:  Right lower quadrant pain today. EXAM: CT ABDOMEN AND PELVIS WITHOUT CONTRAST TECHNIQUE: Multidetector CT imaging of the abdomen and pelvis was performed following the standard protocol without IV contrast. COMPARISON:  CT abdomen and pelvis 08/26/2015 and 05/12/2015. FINDINGS: Marked cardiomegaly is seen as on the prior study. No pleural or pericardial effusion. There is some dependent atelectasis in the lung bases. High attenuation in the gallbladder is new since the most recent examination may be due to the presence of sludge or possibly vicarious excretion of contrast if the patient has had an exam with IV contrast. There is a new small volume of abdominal and pelvic ascites. Colonic wall thickening seen on the prior examination appears worse, particularly in the ascending and transverse colon. The stomach and small bowel are unremarkable. Scattered cysts in the liver noted, unchanged. The  spleen, adrenal glands and biliary tree are unremarkable. There is some atrophy of otherwise unremarkable kidneys. There is also some pancreatic atrophy. No focal bony abnormality is identified. IMPRESSION: Since the most recent CT examination. Changes of colitis appear worse, particularly in the ascending and transverse colon. There is also a new small volume of abdominal and pelvic ascites without focal fluid collection. Increased attenuation within the gallbladder may be due to the presence of sludge or could be vicarious excretion of contrast if the patient has had a study with IV contrast. No evidence of cholecystitis is identified. Electronically Signed   By: Inge Rise M.D.   On: 09/22/2015 19:46   US Abdomen Limited  Result Date: 10/10/2015 CLINICAL DATA:  Ascites EXAM: LIMITED ABDOMEN ULTRASOUND FOR ASCITES TECHNIQUE: Limited ultrasound survey for ascites was performed in all four abdominal quadrants. COMPARISON:  CT scan 09/22/2015. FINDINGS: Four quadrant evaluation of the abdomen reveals a small amount of fluid around the liver with trace fluid in the right lower quadrant. IMPRESSION: Insufficient fluid for paracentesis. Electronically Signed   By: Misty Stanley M.D.   On: 10/10/2015 12:01  Dg Chest Port 1 View  Result Date: 10/10/2015 CLINICAL DATA:  Diabetes, CHF EXAM: PORTABLE CHEST 1 VIEW COMPARISON:  08/26/2015 FINDINGS: Stable cardiomegaly with slight vascular and interstitial prominence suggesting mild edema. Minor basilar atelectasis. No large effusion or pneumothorax. Trachea is midline. Overall stable exam. IMPRESSION: Stable cardiomegaly with vascular congestion versus early edema. Basilar atelectasis. Electronically Signed   By: Jerilynn Mages.  Shick M.D.   On: 10/10/2015 08:11   PHYSICAL EXAM General: NAD sitting in chair eating Neck: JVP 15 cm, no thyromegaly or thyroid nodule.  Lungs: Decreased breath sounds at bases. CV: Nondisplaced PMI.  Heart regular S1/S2, no S3/S4, no  murmur. 1+ edema 1/2 up lower legs bilaterally.   Abdomen: Soft, nontender, no hepatosplenomegaly, + distention.  Neurologic: Alert and oriented x 3.  Psych: Normal affect. Extremities: No clubbing or cyanosis.   TELEMETRY: Reviewed telemetry pt in NSR  ASSESSMENT AND PLAN: 1. 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  Echo 10/16  EF 60-65% with moderate dilated and mildly dysfunctional RV, severe TR. Pericardial effusion had resolved. Diuretics cut back with recent C difficile colitis, now weight is up and she is volume overloaded on exam with NYHA class IIIb symptoms.  She was admitted 7/27 with marked volume overload on exam.  She is diuresing now and BP is stable (  runs upper 80s-90s baseline).  - No significant ascites on ab u/s. Trivial effusion on echo.  - CVP still 15. Co-ox ok.   - Continue 80 mg IV lasix q8 hrs. Weight 145 is below baseline but will diurese as BP and renal function tolerates. Add metolazone. Counseled on need to limit fluid intake.  - Will check standing weights 2. PAH with cor pulmonale: Severe PAH.  Had last [place] in 1/16. Pulmonary pressure remained elevated by echo in 10/16. - Continue ambrisentan 10 mg daily.  - Continue selexipag 1600 mcg bid.  - Continue riociguat 2.5 tid.   - Consider repeat RHC in near future 3. CKD stage III: Creatinine stable at her baseline.  4. 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  She is no longer anticoagulated.  Hemoglobin lower today, does not report overt bleeding.  - Iron stores low, Feraheme given yesterday.  - Will give 1u RBCs 5. Pericardial effusion: Resolved after pericardiocentesis, not seen on echo 12/2014.  Repeat echo this admission with trivial effusion.  6. Atrial fibrillation: Paroxysmal, she is in NSR.  - Continue amiodarone.  - No anticoagulation with h/o recurrent GI bleeding.  7.  Hypothyroidism: May be related to amiodarone.   - On  Levoxyl 25 mcg daily.   Should be followed by PCP.  8. H/o C difficile colitis: No diarrhea, 2 prior hospitalizations were with this.   Glori Bickers MD 10/12/2015 2:14 PM

## 2015-10-13 ENCOUNTER — Other Ambulatory Visit (HOSPITAL_COMMUNITY): Payer: Self-pay | Admitting: Cardiology

## 2015-10-13 DIAGNOSIS — I509 Heart failure, unspecified: Secondary | ICD-10-CM

## 2015-10-13 LAB — CARBOXYHEMOGLOBIN
CARBOXYHEMOGLOBIN: 1.7 % — AB (ref 0.5–1.5)
METHEMOGLOBIN: 1.2 % (ref 0.0–1.5)
O2 SAT: 73.1 %
Total hemoglobin: 8.1 g/dL — ABNORMAL LOW (ref 12.0–16.0)

## 2015-10-13 LAB — CBC WITH DIFFERENTIAL/PLATELET
BASOS ABS: 0 10*3/uL (ref 0.0–0.1)
Basophils Relative: 0 %
EOS PCT: 2 %
Eosinophils Absolute: 0.1 10*3/uL (ref 0.0–0.7)
HEMATOCRIT: 23.8 % — AB (ref 36.0–46.0)
HEMOGLOBIN: 8.1 g/dL — AB (ref 12.0–15.0)
LYMPHS PCT: 27 %
Lymphs Abs: 1.3 10*3/uL (ref 0.7–4.0)
MCH: 25.9 pg — ABNORMAL LOW (ref 26.0–34.0)
MCHC: 34 g/dL (ref 30.0–36.0)
MCV: 76 fL — AB (ref 78.0–100.0)
MONOS PCT: 10 %
Monocytes Absolute: 0.5 10*3/uL (ref 0.1–1.0)
Neutro Abs: 3 10*3/uL (ref 1.7–7.7)
Neutrophils Relative %: 61 %
Platelets: 163 10*3/uL (ref 150–400)
RBC: 3.13 MIL/uL — AB (ref 3.87–5.11)
RDW: 21.9 % — ABNORMAL HIGH (ref 11.5–15.5)
WBC: 4.9 10*3/uL (ref 4.0–10.5)

## 2015-10-13 LAB — BASIC METABOLIC PANEL
ANION GAP: 8 (ref 5–15)
BUN: 39 mg/dL — AB (ref 6–20)
CHLORIDE: 102 mmol/L (ref 101–111)
CO2: 26 mmol/L (ref 22–32)
Calcium: 8.8 mg/dL — ABNORMAL LOW (ref 8.9–10.3)
Creatinine, Ser: 1.88 mg/dL — ABNORMAL HIGH (ref 0.44–1.00)
GFR calc Af Amer: 31 mL/min — ABNORMAL LOW (ref >60)
GFR, EST NON AFRICAN AMERICAN: 27 mL/min — AB (ref >60)
GLUCOSE: 85 mg/dL (ref 65–99)
POTASSIUM: 4.1 mmol/L (ref 3.5–5.1)
SODIUM: 136 mmol/L (ref 135–145)

## 2015-10-13 MED ORDER — METOLAZONE 2.5 MG PO TABS
2.5000 mg | ORAL_TABLET | Freq: Two times a day (BID) | ORAL | Status: AC
  Administered 2015-10-13 – 2015-10-14 (×4): 2.5 mg via ORAL
  Filled 2015-10-13 (×4): qty 1

## 2015-10-13 NOTE — Evaluation (Signed)
Physical Therapy Evaluation Patient Details Name: [name] MRN: [id number] DOB: 08-19-1950 Today's Date: 10/13/2015   History of Present Illness  65 yo female with a history of DM2, HTN, pHTN,  gastroparesis, scleroderma, DJD and diastolic HF admitted with A/C diastolic CHF with PAH and RV failure    Clinical Impression  Pt admitted with above diagnosis. Overall generally weak. Moves slowly, cautious. Pt currently with functional limitations due to the deficits listed below (see PT Problem List).  Pt will benefit from skilled PT to increase their independence and safety with mobility to allow discharge to the venue listed below.       Follow Up Recommendations No PT follow up    Equipment Recommendations  None recommended by PT    Recommendations for Other Services       Precautions / Restrictions Precautions Precautions: Fall Restrictions Weight Bearing Restrictions: No      Mobility  Bed Mobility                  Transfers Overall transfer level: Needs assistance Equipment used: None Transfers: Sit to/from Stand Sit to Stand: Min guard         General transfer comment: move slowly; minguard for safety, no imbalance  Ambulation/Gait Ambulation/Gait assistance: Min assist Ambulation Distance (Feet): 30 Feet Assistive device: 1 person hand held assist Gait Pattern/deviations: Step-through pattern;Decreased stride length   Gait velocity interpretation: Below normal speed for age/gender General Gait Details: slow with short tentative steps  Stairs            Wheelchair Mobility    Modified Rankin (Stroke Patients Only)       Balance Overall balance assessment: Needs assistance Sitting-balance support: No upper extremity supported;Feet supported Sitting balance-Leahy Scale: Fair     Standing balance support: No upper extremity supported Standing balance-Leahy Scale: Fair Standing balance comment: any dynamic balance she seeks UE  support                             Pertinent Vitals/Pain Pain Assessment: No/denies pain    Home Living Family/patient expects to be discharged to:: Private residence Living Arrangements: Other relatives Available Help at Discharge: Family;Available PRN/intermittently Type of Home: Apartment Home Access: Level entry     Home Layout: One level Home Equipment: Walker - 2 wheels;Walker - 4 wheels;Cane - single point;Bedside commode;Grab bars - tub/shower      Prior Function Level of Independence: Needs assistance   Gait / Transfers Assistance Needed: indoors no device; rollator when goes out           Hand Dominance        Extremity/Trunk Assessment   Upper Extremity Assessment: Generalized weakness           Lower Extremity Assessment: Generalized weakness      Cervical / Trunk Assessment: Normal  Communication   Communication: No difficulties  Cognition Arousal/Alertness: Awake/alert Behavior During Therapy: Flat affect Overall Cognitive Status: Within Functional Limits for tasks assessed                      General Comments      Exercises General Exercises - Lower Extremity Ankle Circles/Pumps: AROM;Both;10 reps Long Arc Quad: AROM;Both;10 reps Hip Flexion/Marching: AROM;Both;5 reps;Seated Heel Raises: AAROM;Both;5 reps;Standing Mini-Sqauts: AAROM;Both;5 reps;Standing      Assessment/Plan    PT Assessment Patient needs continued PT services  PT Diagnosis Generalized weakness   PT Problem  List Decreased strength;Decreased activity tolerance;Decreased mobility;Decreased knowledge of use of DME;Decreased balance  PT Treatment Interventions Gait training;Functional mobility training;Therapeutic activities;Balance training;Patient/family education;DME instruction;Therapeutic exercise   PT Goals (Current goals can be found in the Care Plan section) Acute Rehab PT Goals Patient Stated Goal: keep strength while hospitalized PT  Goal Formulation: With patient Time For Goal Achievement: 10/20/15 Potential to Achieve Goals: Good    Frequency Min 3X/week   Barriers to discharge        Co-evaluation               End of Session Equipment Utilized During Treatment: Gait belt Activity Tolerance: Patient limited by fatigue Patient left: in chair;with call bell/phone within reach;with nursing/sitter in room Nurse Communication: Mobility status         Time: 1039-1110 PT Time Calculation (min) (ACUTE ONLY): 31 min   Charges:   PT Evaluation $PT Eval Moderate Complexity: 1 Procedure PT Treatments $Gait Training: 8-22 mins   PT G Codes:        Jonel Sick November 04, 2015, 11:20 AM  Pager [phone]

## 2015-10-13 NOTE — Progress Notes (Signed)
Patient ID: ABBAGAYLE ZARAGOZA, female   DOB: Sep 02, 1950, 65 y.o.   MRN: [id number]   ADVANCED HF ROUNDING NOTE  SUBJECTIVE:   Weight stable today but down from admission. CVP still 15-16.  Co-ox 73%.  Feels better overall.  No dyspnea  Echo reviewed personally. LVEF 60%, D-shaped interventricular septum, RV dilated/HK. RVSP 91. Trivial effusion  AB u/s minimal ascites    Scheduled Meds: . sodium chloride   Intravenous Once  . ambrisentan  10 mg Oral Daily  . amiodarone  200 mg Oral Daily  . enoxaparin (LOVENOX) injection  30 mg Subcutaneous Q24H  . feeding supplement  1 Container Oral TID BM  . feeding supplement (PRO-STAT SUGAR FREE 64)  30 mL Oral TID BM  . fenofibrate  54 mg Oral Daily  . FLUoxetine  20 mg Oral q morning - 10a  . furosemide  80 mg Intravenous Q8H  . levothyroxine  25 mcg Oral QAC breakfast  . metolazone  2.5 mg Oral BID  . nortriptyline  20 mg Oral QHS  . pantoprazole  40 mg Oral Daily  . pregabalin  150 mg Oral TID  . Riociguat  2.5 mg Oral Q8H  . Selexipag  1,600 mcg Oral BID  . sodium chloride flush  10-40 mL Intracatheter Q12H  . sodium chloride flush  3 mL Intravenous Q12H   Continuous Infusions:  PRN Meds:.sodium chloride, acetaminophen, HYDROcodone-acetaminophen, hydrOXYzine, ondansetron (ZOFRAN) IV, sodium chloride flush, sodium chloride flush    Vitals:   10/13/15 0140 10/13/15 0300 10/13/15 0532 10/13/15 0727  BP:  (!) 91/51 (!) 92/53 (!) 97/53  Pulse: 78 77  76  Resp: _0 Temp:  98.7 F (37.1 C)  98.5 F (36.9 C)  TempSrc:  Oral  Oral  SpO2: 92% 92%  92%  Weight:  144 lb 11.2 oz (65.6 kg)    Height:        Intake/Output Summary (Last 24 hours) at 10/13/15 0802 Last data filed at 10/13/15 0300  Gross per 24 hour  Intake              945 ml  Output             2450 ml  Net            -1505 ml    LABS: Basic Metabolic Panel:  Recent Labs  10/12/15 0500 10/13/15 0316  NA 137 136  K 4.5 4.1  CL 104 102  CO2 26 26    GLUCOSE 80 85  BUN 39* 39*  CREATININE 1.78* 1.88*  CALCIUM 8.6* 8.8*   Liver Function Tests: No results for input(s): AST, ALT, ALKPHOS, BILITOT, PROT, ALBUMIN in the last 72 hours. No results for input(s): LIPASE, AMYLASE in the last 72 hours. CBC:  Recent Labs  10/12/15 0500 10/13/15 0316  WBC 3.6* 4.9  NEUTROABS 1.8 PENDING  HGB 7.4* 8.1*  HCT 22.0* 23.8*  MCV 74.8* 76.0*  PLT 149* 163   Cardiac Enzymes: No results for input(s): CKTOTAL, CKMB, CKMBINDEX, TROPONINI in the last 72 hours. BNP: Invalid input(s): POCBNP D-Dimer: No results for input(s): DDIMER in the last 72 hours. Hemoglobin A1C: No results for input(s): HGBA1C in the last 72 hours. Fasting Lipid Panel: No results for input(s): CHOL, HDL, LDLCALC, TRIG, CHOLHDL, LDLDIRECT in the last 72 hours. Thyroid Function Tests: No results for input(s): TSH, T4TOTAL, T3FREE, THYROIDAB in the last 72 hours.  Invalid input(s): FREET3 Anemia Panel:  Recent Labs  10/10/15  1015  FERRITIN 480*  TIBC 160*  IRON 22*    RADIOLOGY: Ct Abdomen Pelvis Wo Contrast  Result Date: 09/22/2015 CLINICAL DATA:  Right lower quadrant pain today. EXAM: CT ABDOMEN AND PELVIS WITHOUT CONTRAST TECHNIQUE: Multidetector CT imaging of the abdomen and pelvis was performed following the standard protocol without IV contrast. COMPARISON:  CT abdomen and pelvis 08/26/2015 and 05/12/2015. FINDINGS: Marked cardiomegaly is seen as on the prior study. No pleural or pericardial effusion. There is some dependent atelectasis in the lung bases. High attenuation in the gallbladder is new since the most recent examination may be due to the presence of sludge or possibly vicarious excretion of contrast if the patient has had an exam with IV contrast. There is a new small volume of abdominal and pelvic ascites. Colonic wall thickening seen on the prior examination appears worse, particularly in the ascending and transverse colon. The stomach and small  bowel are unremarkable. Scattered cysts in the liver noted, unchanged. The spleen, adrenal glands and biliary tree are unremarkable. There is some atrophy of otherwise unremarkable kidneys. There is also some pancreatic atrophy. No focal bony abnormality is identified. IMPRESSION: Since the most recent CT examination. Changes of colitis appear worse, particularly in the ascending and transverse colon. There is also a new small volume of abdominal and pelvic ascites without focal fluid collection. Increased attenuation within the gallbladder may be due to the presence of sludge or could be vicarious excretion of contrast if the patient has had a study with IV contrast. No evidence of cholecystitis is identified. Electronically Signed   By: Inge Rise M.D.   On: 09/22/2015 19:46   US Abdomen Limited  Result Date: 10/10/2015 CLINICAL DATA:  Ascites EXAM: LIMITED ABDOMEN ULTRASOUND FOR ASCITES TECHNIQUE: Limited ultrasound survey for ascites was performed in all four abdominal quadrants. COMPARISON:  CT scan 09/22/2015. FINDINGS: Four quadrant evaluation of the abdomen reveals a small amount of fluid around the liver with trace fluid in the right lower quadrant. IMPRESSION: Insufficient fluid for paracentesis. Electronically Signed   By: Misty Stanley M.D.   On: 10/10/2015 12:01  Dg Chest Port 1 View  Result Date: 10/10/2015 CLINICAL DATA:  Diabetes, CHF EXAM: PORTABLE CHEST 1 VIEW COMPARISON:  08/26/2015 FINDINGS: Stable cardiomegaly with slight vascular and interstitial prominence suggesting mild edema. Minor basilar atelectasis. No large effusion or pneumothorax. Trachea is midline. Overall stable exam. IMPRESSION: Stable cardiomegaly with vascular congestion versus early edema. Basilar atelectasis. Electronically Signed   By: Jerilynn Mages.  Shick M.D.   On: 10/10/2015 08:11   PHYSICAL EXAM General: NAD sitting in chair eating Neck: JVP 15 cm, no thyromegaly or thyroid nodule.  Lungs: Decreased breath sounds  at bases. CV: RV heave.  Heart regular S1/S2, no S3/S4, no murmur. Trace ankle edema.   Abdomen: Soft, nontender, no hepatosplenomegaly, mild distention but soft.  Neurologic: Alert and oriented x 3.  Psych: Normal affect. Extremities: No clubbing or cyanosis.   TELEMETRY: Reviewed telemetry pt in NSR  ASSESSMENT AND PLAN: 1. 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  Echo 10/16  EF 60-65% with moderate dilated and mildly dysfunctional RV, severe TR. Pericardial effusion had resolved. Diuretics cut back with recent C difficile colitis, now weight is up and she is volume overloaded on exam with NYHA class IIIb symptoms.  She was admitted 7/27 with marked volume overload on exam.  She is diuresing now and BP is stable (runs upper 80s-90s baseline).  No significant ascites  on ab u/s. Trivial effusion on echo. CVP still 15-16. Co-ox ok.   - Continue 80 mg IV lasix q8 hrs. Will metolazone 2.5 bid today.  2. PAH with cor pulmonale: Severe PAH.  Had last [place] in 1/16. Pulmonary pressure remained elevated by echo in 10/16. - Continue ambrisentan 10 mg daily.  - Continue selexipag 1600 mcg bid.  - Continue riociguat 2.5 tid.   - Consider repeat RHC in near future 3. CKD stage III: Creatinine stable at her baseline, 1.88 today.  4. 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  She is no longer anticoagulated.  Hemoglobin higher, got 1 unit PRBCs on 7/30.  - Iron stores low, got Feraheme this admission.  5. Pericardial effusion: Resolved after pericardiocentesis, not seen on echo 12/2014.  Repeat echo this admission with trivial effusion.  6. Atrial fibrillation: Paroxysmal, she is in NSR.  - Continue amiodarone.  - No anticoagulation with h/o recurrent GI bleeding.  7.  Hypothyroidism: May be related to amiodarone.   - On Levoxyl 25 mcg daily.   Should be followed by PCP.  8. H/o C difficile colitis: No diarrhea, 2 prior  hospitalizations were with this.   [name] MD 10/13/2015 8:02 AM

## 2015-10-13 NOTE — Progress Notes (Signed)
[facility]  Patient Status: Active (receiving services up to time of hospitalization)  [facility] is providing the following services: RN and PT  If patient discharges after hours, please call [phone].   [name] 10/13/2015, 10:10 AM

## 2015-10-14 DIAGNOSIS — N179 Acute kidney failure, unspecified: Secondary | ICD-10-CM

## 2015-10-14 LAB — CBC WITH DIFFERENTIAL/PLATELET
Eosinophils Relative: 3 %
HCT: 25.3 % — ABNORMAL LOW (ref 36.0–46.0)
Hemoglobin: 8.2 g/dL — ABNORMAL LOW (ref 12.0–15.0)
Lymphocytes Relative: 22 %
MCH: 24.8 pg — AB (ref 26.0–34.0)
MCHC: 32.4 g/dL (ref 30.0–36.0)
MCV: 76.7 fL — AB (ref 78.0–100.0)
Monocytes Relative: 10 %
NEUTROS PCT: 65 %
PLATELETS: 170 10*3/uL (ref 150–400)
RBC: 3.3 MIL/uL — AB (ref 3.87–5.11)
RDW: 21.7 % — AB (ref 11.5–15.5)
WBC: 5.6 10*3/uL (ref 4.0–10.5)

## 2015-10-14 LAB — BASIC METABOLIC PANEL
ANION GAP: 8 (ref 5–15)
BUN: 44 mg/dL — ABNORMAL HIGH (ref 6–20)
CALCIUM: 8.9 mg/dL (ref 8.9–10.3)
CO2: 27 mmol/L (ref 22–32)
CREATININE: 2.08 mg/dL — AB (ref 0.44–1.00)
Chloride: 99 mmol/L — ABNORMAL LOW (ref 101–111)
GFR, EST AFRICAN AMERICAN: 28 mL/min — AB (ref >60)
GFR, EST NON AFRICAN AMERICAN: 24 mL/min — AB (ref >60)
Glucose, Bld: 87 mg/dL (ref 65–99)
Potassium: 3.7 mmol/L (ref 3.5–5.1)
SODIUM: 134 mmol/L — AB (ref 135–145)

## 2015-10-14 LAB — CARBOXYHEMOGLOBIN
Carboxyhemoglobin: 2.4 % — ABNORMAL HIGH (ref 0.5–1.5)
METHEMOGLOBIN: 0.8 % (ref 0.0–1.5)
O2 Saturation: 76.6 %
Total hemoglobin: 8.5 g/dL — ABNORMAL LOW (ref 12.0–16.0)

## 2015-10-14 NOTE — Progress Notes (Signed)
Patient ID: [name], female   DOB: Oct 03, 1950, 65 y.o.   MRN: [id number]   ADVANCED HF ROUNDING NOTE  SUBJECTIVE:   Weight down 3 lbs, good diuresis yesterday. CVP still 16-17.  Co-ox 77%.  Feels better overall.  No dyspnea at rest.  Creatinine up some to 2.08.   Echo reviewed personally. LVEF 60%, D-shaped interventricular septum, RV dilated/HK. RVSP 91. Trivial effusion  AB u/s minimal ascites    Scheduled Meds: . sodium chloride   Intravenous Once  . ambrisentan  10 mg Oral Daily  . amiodarone  200 mg Oral Daily  . enoxaparin (LOVENOX) injection  30 mg Subcutaneous Q24H  . feeding supplement  1 Container Oral TID BM  . feeding supplement (PRO-STAT SUGAR FREE 64)  30 mL Oral TID BM  . fenofibrate  54 mg Oral Daily  . FLUoxetine  20 mg Oral q morning - 10a  . furosemide  80 mg Intravenous Q8H  . levothyroxine  25 mcg Oral QAC breakfast  . metolazone  2.5 mg Oral BID  . nortriptyline  20 mg Oral QHS  . pantoprazole  40 mg Oral Daily  . pregabalin  150 mg Oral TID  . Riociguat  2.5 mg Oral Q8H  . Selexipag  1,600 mcg Oral BID  . sodium chloride flush  10-40 mL Intracatheter Q12H  . sodium chloride flush  3 mL Intravenous Q12H   Continuous Infusions:  PRN Meds:.sodium chloride, acetaminophen, HYDROcodone-acetaminophen, hydrOXYzine, ondansetron (ZOFRAN) IV, sodium chloride flush, sodium chloride flush    Vitals:   10/13/15 2102 10/13/15 2204 10/13/15 2351 10/14/15 0331  BP: (!) 92/49 (!) 94/51 (!) 96/53 (!) 94/52  Pulse: 63 81 80 80  Resp: _0 Temp:   98.5 F (36.9 C) 98.7 F (37.1 C)  TempSrc:   Oral Oral  SpO2: 94% 92% 94% 95%  Weight:    141 lb 6.4 oz (64.1 kg)  Height:        Intake/Output Summary (Last 24 hours) at 10/14/15 0746 Last data filed at 10/14/15 0600  Gross per 24 hour  Intake              960 ml  Output             3000 ml  Net            -2040 ml    LABS: Basic Metabolic Panel:  Recent Labs  10/13/15 0316 10/14/15 0330    NA 136 134*  K 4.1 3.7  CL 102 99*  CO2 26 27  GLUCOSE 85 87  BUN 39* 44*  CREATININE 1.88* 2.08*  CALCIUM 8.8* 8.9   Liver Function Tests: No results for input(s): AST, ALT, ALKPHOS, BILITOT, PROT, ALBUMIN in the last 72 hours. No results for input(s): LIPASE, AMYLASE in the last 72 hours. CBC:  Recent Labs  10/12/15 0500 10/13/15 0316 10/14/15 0330  WBC 3.6* 4.9 5.6  NEUTROABS 1.8 3.0  --   HGB 7.4* 8.1* 8.2*  HCT 22.0* 23.8* 25.3*  MCV 74.8* 76.0* 76.7*  PLT 149* 163 170   Cardiac Enzymes: No results for input(s): CKTOTAL, CKMB, CKMBINDEX, TROPONINI in the last 72 hours. BNP: Invalid input(s): POCBNP D-Dimer: No results for input(s): DDIMER in the last 72 hours. Hemoglobin A1C: No results for input(s): HGBA1C in the last 72 hours. Fasting Lipid Panel: No results for input(s): CHOL, HDL, LDLCALC, TRIG, CHOLHDL, LDLDIRECT in the last 72 hours. Thyroid Function Tests: No results for input(s):  TSH, T4TOTAL, T3FREE, THYROIDAB in the last 72 hours.  Invalid input(s): FREET3 Anemia Panel: No results for input(s): VITAMINB12, FOLATE, FERRITIN, TIBC, IRON, RETICCTPCT in the last 72 hours.  RADIOLOGY: Ct Abdomen Pelvis Wo Contrast  Result Date: 09/22/2015 CLINICAL DATA:  Right lower quadrant pain today. EXAM: CT ABDOMEN AND PELVIS WITHOUT CONTRAST TECHNIQUE: Multidetector CT imaging of the abdomen and pelvis was performed following the standard protocol without IV contrast. COMPARISON:  CT abdomen and pelvis 08/26/2015 and 05/12/2015. FINDINGS: Marked cardiomegaly is seen as on the prior study. No pleural or pericardial effusion. There is some dependent atelectasis in the lung bases. High attenuation in the gallbladder is new since the most recent examination may be due to the presence of sludge or possibly vicarious excretion of contrast if the patient has had an exam with IV contrast. There is a new small volume of abdominal and pelvic ascites. Colonic wall thickening seen  on the prior examination appears worse, particularly in the ascending and transverse colon. The stomach and small bowel are unremarkable. Scattered cysts in the liver noted, unchanged. The spleen, adrenal glands and biliary tree are unremarkable. There is some atrophy of otherwise unremarkable kidneys. There is also some pancreatic atrophy. No focal bony abnormality is identified. IMPRESSION: Since the most recent CT examination. Changes of colitis appear worse, particularly in the ascending and transverse colon. There is also a new small volume of abdominal and pelvic ascites without focal fluid collection. Increased attenuation within the gallbladder may be due to the presence of sludge or could be vicarious excretion of contrast if the patient has had a study with IV contrast. No evidence of cholecystitis is identified. Electronically Signed   By: Inge Rise M.D.   On: 09/22/2015 19:46   US Abdomen Limited  Result Date: 10/10/2015 CLINICAL DATA:  Ascites EXAM: LIMITED ABDOMEN ULTRASOUND FOR ASCITES TECHNIQUE: Limited ultrasound survey for ascites was performed in all four abdominal quadrants. COMPARISON:  CT scan 09/22/2015. FINDINGS: Four quadrant evaluation of the abdomen reveals a small amount of fluid around the liver with trace fluid in the right lower quadrant. IMPRESSION: Insufficient fluid for paracentesis. Electronically Signed   By: Misty Stanley M.D.   On: 10/10/2015 12:01  Dg Chest Port 1 View  Result Date: 10/10/2015 CLINICAL DATA:  Diabetes, CHF EXAM: PORTABLE CHEST 1 VIEW COMPARISON:  08/26/2015 FINDINGS: Stable cardiomegaly with slight vascular and interstitial prominence suggesting mild edema. Minor basilar atelectasis. No large effusion or pneumothorax. Trachea is midline. Overall stable exam. IMPRESSION: Stable cardiomegaly with vascular congestion versus early edema. Basilar atelectasis. Electronically Signed   By: Jerilynn Mages.  Shick M.D.   On: 10/10/2015 08:11   PHYSICAL  EXAM General: NAD sitting in chair eating Neck: JVP 15 cm, no thyromegaly or thyroid nodule.  Lungs: Decreased breath sounds at bases. CV: RV heave.  Heart regular S1/S2, no S3/S4, no murmur. No edema.   Abdomen: Soft, nontender, no hepatosplenomegaly, mild distention but soft.  Neurologic: Alert and oriented x 3.  Psych: Normal affect. Extremities: No clubbing or cyanosis.   TELEMETRY: Reviewed telemetry pt in NSR  ASSESSMENT AND PLAN: 1. 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  Echo 10/16  EF 60-65% with moderate dilated and mildly dysfunctional RV, severe TR. Pericardial effusion had resolved. Diuretics cut back with recent C difficile colitis, now weight is up and she is volume overloaded on exam with NYHA class IIIb symptoms.  She was admitted 7/27 with marked volume overload on exam.  She is diuresing now and BP is stable (runs upper 80s-90s baseline).  No significant ascites on ab u/s. Trivial effusion on echo. CVP still 16-17 despite good diuresis yesterday.  Weight down. Co-ox ok.  Creatinine higher.  - Continue 80 mg IV lasix q8 hrs today and will give metolazone 2.5 bid today.  Stop diuretics after her evening doses, if creatinine remains relatively stable will resume torsemide tomorrow.  2. PAH with cor pulmonale: Severe PAH.  Had last [place] in 1/16. Pulmonary pressure remained elevated by echo in 10/16. - Continue ambrisentan 10 mg daily.  - Continue selexipag 1600 mcg bid.  - Continue riociguat 2.5 tid.   - Consider repeat RHC in near future 3. CKD stage III: Creatinine mildly higher, will push IV diuresis 1 more day as CVP still up then stop.  4. 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  She is no longer anticoagulated.  Hemoglobin higher, got 1 unit PRBCs on 7/30.  Hemoglobin stable.  - Iron stores low, got Feraheme this admission.  5. Pericardial effusion: Resolved after pericardiocentesis, not seen on  echo 12/2014.  Repeat echo this admission with trivial effusion.  6. Atrial fibrillation: Paroxysmal, she is in NSR.  - Continue amiodarone.  - No anticoagulation with h/o recurrent GI bleeding.  7.  Hypothyroidism: May be related to amiodarone.   - On Levoxyl 25 mcg daily.   Should be followed by PCP.  8. H/o C difficile colitis: No diarrhea, 2 prior hospitalizations were with this.   [name] MD 10/14/2015 7:46 AM

## 2015-10-14 NOTE — Progress Notes (Signed)
Pt made aware that med. letair[facility] from home ran out of stock from the pharmacy and asked her to let family to send  some.

## 2015-10-15 ENCOUNTER — Inpatient Hospital Stay (HOSPITAL_COMMUNITY): Admission: RE | Admit: 2015-10-15 | Payer: Medicare Other | Source: Ambulatory Visit

## 2015-10-15 LAB — CARBOXYHEMOGLOBIN
Carboxyhemoglobin: 2.1 % — ABNORMAL HIGH (ref 0.5–1.5)
METHEMOGLOBIN: 1.2 % (ref 0.0–1.5)
O2 Saturation: 65.3 %
TOTAL HEMOGLOBIN: 8.7 g/dL — AB (ref 12.0–16.0)

## 2015-10-15 LAB — CBC
HEMATOCRIT: 25.5 % — AB (ref 36.0–46.0)
HEMOGLOBIN: 8.6 g/dL — AB (ref 12.0–15.0)
MCH: 25.7 pg — ABNORMAL LOW (ref 26.0–34.0)
MCHC: 33.7 g/dL (ref 30.0–36.0)
MCV: 76.1 fL — AB (ref 78.0–100.0)
Platelets: 181 10*3/uL (ref 150–400)
RBC: 3.35 MIL/uL — ABNORMAL LOW (ref 3.87–5.11)
RDW: 22.2 % — ABNORMAL HIGH (ref 11.5–15.5)
WBC: 5.1 10*3/uL (ref 4.0–10.5)

## 2015-10-15 LAB — BASIC METABOLIC PANEL
ANION GAP: 10 (ref 5–15)
BUN: 52 mg/dL — ABNORMAL HIGH (ref 6–20)
CHLORIDE: 97 mmol/L — AB (ref 101–111)
CO2: 26 mmol/L (ref 22–32)
Calcium: 8.7 mg/dL — ABNORMAL LOW (ref 8.9–10.3)
Creatinine, Ser: 1.98 mg/dL — ABNORMAL HIGH (ref 0.44–1.00)
GFR calc Af Amer: 30 mL/min — ABNORMAL LOW (ref >60)
GFR, EST NON AFRICAN AMERICAN: 26 mL/min — AB (ref >60)
GLUCOSE: 81 mg/dL (ref 65–99)
POTASSIUM: 3 mmol/L — AB (ref 3.5–5.1)
SODIUM: 133 mmol/L — AB (ref 135–145)

## 2015-10-15 MED ORDER — TORSEMIDE 20 MG PO TABS: ORAL_TABLET | ORAL | 6 refills | Status: DC

## 2015-10-15 MED ORDER — TORSEMIDE 20 MG PO TABS: 60.0000 mg | ORAL_TABLET | Freq: Every evening | ORAL | Status: DC

## 2015-10-15 MED ORDER — METOLAZONE 2.5 MG PO TABS: 2.5000 mg | ORAL_TABLET | ORAL | 6 refills | Status: DC

## 2015-10-15 MED ORDER — TORSEMIDE 20 MG PO TABS
80.0000 mg | ORAL_TABLET | Freq: Every day | ORAL | Status: DC
  Administered 2015-10-15: 80 mg via ORAL
  Filled 2015-10-15: qty 4

## 2015-10-15 MED ORDER — POTASSIUM CHLORIDE CRYS ER 20 MEQ PO TBCR
40.0000 meq | EXTENDED_RELEASE_TABLET | Freq: Once | ORAL | Status: AC
  Administered 2015-10-15: 40 meq via ORAL
  Filled 2015-10-15: qty 2

## 2015-10-15 MED ORDER — POTASSIUM CHLORIDE CRYS ER 20 MEQ PO TBCR: 20.0000 meq | EXTENDED_RELEASE_TABLET | Freq: Every day | ORAL | 6 refills | Status: DC

## 2015-10-15 NOTE — Progress Notes (Signed)
Patient ID: [name], female   DOB: [birth year], 65 y.o.   MRN: [id number]   ADVANCED HF ROUNDING NOTE  SUBJECTIVE:   Good diuresis again yesterday. CVP still 16.  Co-ox 65%.  Feels better overall.  No dyspnea at rest.  Creatinine stable but BUN up again.   Echo: LVEF 60%, D-shaped interventricular septum, RV dilated/HK. RVSP 91. Severe TR.  Trivial pericardial effusion  AB u/s minimal ascites    Scheduled Meds: . sodium chloride   Intravenous Once  . ambrisentan  10 mg Oral Daily  . amiodarone  200 mg Oral Daily  . enoxaparin (LOVENOX) injection  30 mg Subcutaneous Q24H  . feeding supplement  1 Container Oral TID BM  . feeding supplement (PRO-STAT SUGAR FREE 64)  30 mL Oral TID BM  . fenofibrate  54 mg Oral Daily  . FLUoxetine  20 mg Oral q morning - 10a  . levothyroxine  25 mcg Oral QAC breakfast  . nortriptyline  20 mg Oral QHS  . pantoprazole  40 mg Oral Daily  . pregabalin  150 mg Oral TID  . Riociguat  2.5 mg Oral Q8H  . Selexipag  1,600 mcg Oral BID  . sodium chloride flush  10-40 mL Intracatheter Q12H  . sodium chloride flush  3 mL Intravenous Q12H   Continuous Infusions:  PRN Meds:.sodium chloride, acetaminophen, HYDROcodone-acetaminophen, hydrOXYzine, ondansetron (ZOFRAN) IV, sodium chloride flush, sodium chloride flush    Vitals:   10/14/15 2003 10/14/15 2354 10/15/15 0400 10/15/15 0405  BP: (!) 91/51 (!) 82/54  (!) 90/54  Pulse: 79 (!) 58  61  Resp: _0 Temp: 98 F (36.7 C) 98.2 F (36.8 C) 98.2 F (36.8 C)   TempSrc: Oral Oral Oral   SpO2: 95% 96%  93%  Weight:    141 lb 1.6 oz (64 kg)  Height:        Intake/Output Summary (Last 24 hours) at 10/15/15 0749 Last data filed at 10/15/15 0500  Gross per 24 hour  Intake              490 ml  Output             2650 ml  Net            -2160 ml    LABS: Basic Metabolic Panel:  Recent Labs  10/14/15 0330 10/15/15 0454  NA 134* 133*  K 3.7 3.0*  CL 99* 97*  CO2 27 26  GLUCOSE 87 81    BUN 44* 52*  CREATININE 2.08* 1.98*  CALCIUM 8.9 8.7*   Liver Function Tests: No results for input(s): AST, ALT, ALKPHOS, BILITOT, PROT, ALBUMIN in the last 72 hours. No results for input(s): LIPASE, AMYLASE in the last 72 hours. CBC:  Recent Labs  10/13/15 0316 10/14/15 0330 10/15/15 0454  WBC 4.9 5.6 5.1  NEUTROABS 3.0  --   --   HGB 8.1* 8.2* 8.6*  HCT 23.8* 25.3* 25.5*  MCV 76.0* 76.7* 76.1*  PLT 163 170 181   Cardiac Enzymes: No results for input(s): CKTOTAL, CKMB, CKMBINDEX, TROPONINI in the last 72 hours. BNP: Invalid input(s): POCBNP D-Dimer: No results for input(s): DDIMER in the last 72 hours. Hemoglobin A1C: No results for input(s): HGBA1C in the last 72 hours. Fasting Lipid Panel: No results for input(s): CHOL, HDL, LDLCALC, TRIG, CHOLHDL, LDLDIRECT in the last 72 hours. Thyroid Function Tests: No results for input(s): TSH, T4TOTAL, T3FREE, THYROIDAB in the last 72 hours.  Invalid  input(s): FREET3 Anemia Panel: No results for input(s): VITAMINB12, FOLATE, FERRITIN, TIBC, IRON, RETICCTPCT in the last 72 hours.  RADIOLOGY: Ct Abdomen Pelvis Wo Contrast  Result Date: 09/22/2015 CLINICAL DATA:  Right lower quadrant pain today. EXAM: CT ABDOMEN AND PELVIS WITHOUT CONTRAST TECHNIQUE: Multidetector CT imaging of the abdomen and pelvis was performed following the standard protocol without IV contrast. COMPARISON:  CT abdomen and pelvis 08/26/2015 and 05/12/2015. FINDINGS: Marked cardiomegaly is seen as on the prior study. No pleural or pericardial effusion. There is some dependent atelectasis in the lung bases. High attenuation in the gallbladder is new since the most recent examination may be due to the presence of sludge or possibly vicarious excretion of contrast if the patient has had an exam with IV contrast. There is a new small volume of abdominal and pelvic ascites. Colonic wall thickening seen on the prior examination appears worse, particularly in the  ascending and transverse colon. The stomach and small bowel are unremarkable. Scattered cysts in the liver noted, unchanged. The spleen, adrenal glands and biliary tree are unremarkable. There is some atrophy of otherwise unremarkable kidneys. There is also some pancreatic atrophy. No focal bony abnormality is identified. IMPRESSION: Since the most recent CT examination. Changes of colitis appear worse, particularly in the ascending and transverse colon. There is also a new small volume of abdominal and pelvic ascites without focal fluid collection. Increased attenuation within the gallbladder may be due to the presence of sludge or could be vicarious excretion of contrast if the patient has had a study with IV contrast. No evidence of cholecystitis is identified. Electronically Signed   By: Inge Rise M.D.   On: 09/22/2015 19:46   US Abdomen Limited  Result Date: 10/10/2015 CLINICAL DATA:  Ascites EXAM: LIMITED ABDOMEN ULTRASOUND FOR ASCITES TECHNIQUE: Limited ultrasound survey for ascites was performed in all four abdominal quadrants. COMPARISON:  CT scan 09/22/2015. FINDINGS: Four quadrant evaluation of the abdomen reveals a small amount of fluid around the liver with trace fluid in the right lower quadrant. IMPRESSION: Insufficient fluid for paracentesis. Electronically Signed   By: Misty Stanley M.D.   On: 10/10/2015 12:01  Dg Chest Port 1 View  Result Date: 10/10/2015 CLINICAL DATA:  Diabetes, CHF EXAM: PORTABLE CHEST 1 VIEW COMPARISON:  08/26/2015 FINDINGS: Stable cardiomegaly with slight vascular and interstitial prominence suggesting mild edema. Minor basilar atelectasis. No large effusion or pneumothorax. Trachea is midline. Overall stable exam. IMPRESSION: Stable cardiomegaly with vascular congestion versus early edema. Basilar atelectasis. Electronically Signed   By: Jerilynn Mages.  Shick M.D.   On: 10/10/2015 08:11   PHYSICAL EXAM General: NAD  Neck: JVP 10-11 cm with prominent CV wave, no  thyromegaly or thyroid nodule.  Lungs: Decreased breath sounds at bases. CV: RV heave.  Heart regular S1/S2, no S3/S4, 2/6 HSM LLSB. No edema.   Abdomen: Soft, nontender, no hepatosplenomegaly, mild distention but soft.  Neurologic: Alert and oriented x 3.  Psych: Normal affect. Extremities: No clubbing or cyanosis.   TELEMETRY: Reviewed telemetry pt in NSR  ASSESSMENT AND PLAN: 1. 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  Echo 10/16  EF 60-65% with moderate dilated and mildly dysfunctional RV, severe TR. Pericardial effusion had resolved. Diuretics cut back with recent C difficile colitis, now weight is up and she is volume overloaded on exam with NYHA class IIIb symptoms.  She was admitted 7/27 with marked volume overload on exam.  She has diuresed well and BP is stable (  runs upper 80s-90s baseline).  No significant ascites on ab u/s. Trivial pericardial effusion on echo. CVP still 16 despite good diuresis again yesterday.  I suspect we are not going to get CVP to normal range given severe tricuspid regurgitation.  Weight down. Co-ox ok.  Creatinine stable but BUN higher. - Restart po diuretics, torsemide 80 qam/60 qpm.  She will take metolazone twice a week as prior to admission.   2. PAH with cor pulmonale: Severe PAH.  Had last [place] in 1/16. Pulmonary pressure remained elevated by echo in 10/16. - Continue ambrisentan 10 mg daily.  - Continue selexipag 1600 mcg bid.  - Continue riociguat 2.5 tid.   3. CKD stage III: Creatinine stable but BUN higher, think we have reached the limit for aggressive diuresis, start po diuretics.  4. 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  She is no longer anticoagulated.  Hemoglobin higher, got 1 unit PRBCs on 7/30.  Hemoglobin stable.  - Iron stores low, got Feraheme this admission.  5. Pericardial effusion: Resolved after pericardiocentesis, not seen on echo 12/2014.  Repeat echo this  admission with trivial effusion.  6. Atrial fibrillation: Paroxysmal, she is in NSR.  - Continue amiodarone.  - No anticoagulation with h/o recurrent GI bleeding.  7.  Hypothyroidism: May be related to amiodarone.   - On Levoxyl 25 mcg daily.   Should be followed by PCP.  8. H/o C difficile colitis: No diarrhea, 2 prior hospitalizations were with this.  9. Hypokalemia: Got metolazone yesterday, replete.  10. Severe tricuspid regurgitation 11. Disposition: She may go home today.  She will need close followup in CHF clinic, will try to arrange for next week.  She may go home on the same regimen of medication she was on prior to admission except will have her take torsemide 80 qam/60 qpm with metolazone twice a week.  She will take KCl 20 daily with an extra 20 on metolazone days.   [name] MD 10/15/2015 7:49 AM

## 2015-10-15 NOTE — Progress Notes (Signed)
Pt for disch. Have alerted donna w [facility] homecare and [facility] orders in chart.

## 2015-10-15 NOTE — Progress Notes (Signed)
Discharged home  by wheelchair accompanied by grand daughter, meds from home and discharge instructions given to pt. Belongings taken home.

## 2015-10-15 NOTE — Progress Notes (Signed)
Physical Therapy Treatment Patient Details Name: [name] MRN: [id number] DOB: 1951-02-08 Today's Date: 10/15/2015    History of Present Illness 65 yo female with a history of DM2, HTN, pHTN,  gastroparesis, scleroderma, DJD and diastolic HF admitted with A/C diastolic CHF with PAH and RV failure    PT Comments    Ms. Mervin made good progress today, ambulating 200 ft with use of RW and min guard for safety.  Pt moving at a slow pace and fatigues but is motivated to get stronger.    Follow Up Recommendations  No PT follow up     Equipment Recommendations  None recommended by PT    Recommendations for Other Services       Precautions / Restrictions Precautions Precautions: Fall Restrictions Weight Bearing Restrictions: No    Mobility  Bed Mobility Overal bed mobility: Modified Independent Bed Mobility: Supine to Sit;Sit to Supine     Supine to sit: Modified independent (Device/Increase time) Sit to supine: Modified independent (Device/Increase time)   General bed mobility comments: Increased time but no physical assist needed  Transfers Overall transfer level: Needs assistance Equipment used: Rolling walker (2 wheeled) Transfers: Sit to/from Stand Sit to Stand: Min guard         General transfer comment: Moves slowly, cues for hand placement  Ambulation/Gait Ambulation/Gait assistance: Min guard Ambulation Distance (Feet): 200 Feet Assistive device: Rolling walker (2 wheeled) Gait Pattern/deviations: Step-through pattern;Decreased stride length Gait velocity: decreased   General Gait Details: Decreased gait speed and min guard for safety   Stairs            Wheelchair Mobility    Modified Rankin (Stroke Patients Only)       Balance Overall balance assessment: Needs assistance Sitting-balance support: No upper extremity supported;Feet supported Sitting balance-Leahy Scale: Good     Standing balance support: No upper extremity  supported;During functional activity Standing balance-Leahy Scale: Fair Standing balance comment: RW for dynamic activity                    Cognition Arousal/Alertness: Awake/alert Behavior During Therapy: Flat affect Overall Cognitive Status: Within Functional Limits for tasks assessed                      Exercises General Exercises - Lower Extremity Straight Leg Raises: Both;10 reps;Supine    General Comments General comments (skin integrity, edema, etc.): VSS      Pertinent Vitals/Pain Pain Assessment: Faces Faces Pain Scale: Hurts a little bit Pain Location: chronic pain Bil feet Pain Descriptors / Indicators: Discomfort Pain Intervention(s): Limited activity within patient's tolerance;Monitored during session    Home Living                      Prior Function            PT Goals (current goals can now be found in the care plan section) Acute Rehab PT Goals Patient Stated Goal: to go home today PT Goal Formulation: With patient Time For Goal Achievement: 10/20/15 Potential to Achieve Goals: Good Progress towards PT goals: Progressing toward goals    Frequency  Min 3X/week    PT Plan Current plan remains appropriate    Co-evaluation             End of Session Equipment Utilized During Treatment: Gait belt Activity Tolerance: Patient limited by fatigue Patient left: with call bell/phone within reach;in bed;with bed alarm set;with nursing/sitter in room  Time: 1040-1105 PT Time Calculation (min) (ACUTE ONLY): 25 min  Charges:  $Gait Training: 8-22 mins $Therapeutic Activity: 8-22 mins                    G Codes:       Collie Siad PT, DPT  Pager: [phone] Phone: [phone] 10/15/2015, 11:15 AM

## 2015-10-15 NOTE — Discharge Summary (Signed)
Advanced Heart Failure Discharge Note   Discharge Summary   Patient ID: [name] MRN: [id number], DOB/AGE: November 14, 1950 65 y.o. Admit date: 10/09/2015 D/C date:     10/15/2015   Primary Discharge Diagnoses:  1. 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 Echo 10/16 EF 60-65% with moderate dilated and mildly dysfunctional RV, severe TR. 2. PAH with cor pulmonale: Severe PAH.  3. CKD stage III 4. H/O GI bleed 5. Pericardial effusion 6. Atrial fibrillation 7. Hypothyroidism 8. H/o C difficile colitis 9. Hypokalemia:  10. Severe tricuspid regurgitation  Hospital Course:   [name] is a 65 yo female with a history of DM2, HLD, HTN, pHTN, GERD, gastroparesis, scleroderma, DJD and diastolic HF admitted from HF clinic 10/09/15 with failure to respond to outpatient adjustments of diuretics.  PICC line placed for Coox and CVP. Cardiac output stable. CVP elevated.  Problem based HPI as below.  1. 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 Echo 10/16 EF 60-65% with moderate dilated and mildly dysfunctional RV, severe TR. Pericardial effusion had resolved. No significant ascites on ab u/s. Trivial pericardial effusion on echo. - CVP remained as high as 16 despite good diuresis. Suspect this is most liken give her severe TR, and unlikely to get this to come down much further.  Creatinine stable this morning but BUN also higher.  - Restarted on po diuretics with slightly increased dose from home regimen at torsemide 80 qam/60 qpm.  She will take metolazone twice a week as prior to admission.   2. PAH with cor pulmonale: Severe PAH. Had last [place] in 1/16. Pulmonary pressure remained elevated by echo in 10/16. - No change to her current meds this admission.  3. CKD stage III:  - Creatinine stable but BUN higher, thought to have have reached the limit for aggressive diuresis and started back on po diuretics. 4. H/O GI bleed: Duodenal AVM  noted 03/23/14. AVMs noted 01/2015. AVMs clipped in 4/17. She is no longer anticoagulated. Given 1 unit of PRBCs on 7/30 for Hgb 7.4.  Hemoglobin stable on d/c. Iron stores low this admission and give Feraheme.  5. Pericardial effusion: Resolved after pericardiocentesis, not seen on echo 12/2014. Repeat echo this admission with trivial effusion.  6. Atrial fibrillation: Paroxysmal, she remained in NSR this admission.  - Continue amiodarone.  - No anticoagulation with h/o recurrent GI bleeding.  7. Hypothyroidism: May be related to amiodarone.  - On Levoxyl 25 mcg daily. Should be followed by PCP.  8. H/o C difficile colitis: No diarrhea, 2 prior hospitalizations were with this.  9. Hypokalemia: - Repleted day of discharge after received metolazone the previous day. Will have [place] check 10/17/15. 10. Severe tricuspid regurgitation - Stable this admission.   Overall she diuresed 12 L and down 11 lbs this admission.  She will be discharged to home in stable condition with close follow up as below.  PICC line removed prior to d/c.   Discharge Weight Range: 141 lbs Discharge Vitals: Blood pressure (!) 92/56, pulse 65, temperature 98.2 F (36.8 C), temperature source Oral, resp. rate 14, height _0  (1.626 m), weight 141 lb 1.6 oz (64 kg), SpO2 97 %.  Labs: Lab Results  Component Value Date   WBC 5.1 10/15/2015   HGB 8.6 (L) 10/15/2015   HCT 25.5 (L) 10/15/2015   MCV 76.1 (L) 10/15/2015   PLT 181 10/15/2015    Recent Labs Lab 10/09/15 1818  10/15/15  0454  NA 140  < > 133*  K 3.9  < > 3.0*  CL 105  < > 97*  CO2 26  < > 26  BUN 46*  < > 52*  CREATININE 1.78*  < > 1.98*  CALCIUM 8.4*  < > 8.7*  PROT 7.1  --   --   BILITOT 0.8  --   --   ALKPHOS 122  --   --   ALT 7*  --   --   AST 16  --   --   GLUCOSE 67  < > 81  < > = values in this interval not displayed. Lab Results  Component Value Date   CHOL 117 04/23/2015   HDL 42 04/23/2015   LDLCALC 60 04/23/2015   TRIG 75  04/23/2015   BNP (last 3 results)  Recent Labs  03/27/15 1617 07/29/15 1304 10/03/15 1435  BNP 447.6* 589.8* 637.4*    ProBNP (last 3 results) No results for input(s): PROBNP in the last 8760 hours.   Diagnostic Studies/Procedures   No results found.  Discharge Medications     Medication List    TAKE these medications   ADEMPAS 2.5 MG Tabs Generic drug:  Riociguat Take by mouth.   ambrisentan 10 MG tablet Commonly known as:  LETAIRIS Take 1 tablet (10 mg total) by mouth daily.   amiodarone 200 MG tablet Commonly known as:  PACERONE Take 1 tablet (200 mg total) by mouth daily.   esomeprazole 40 MG capsule Commonly known as:  NEXIUM Take 40 mg by mouth daily at 12 noon.   feeding supplement (PRO-STAT SUGAR FREE 64) Liqd Take 30 mLs by mouth 3 (three) times daily between meals.   fenofibrate 54 MG tablet Take 1 tablet (54 mg total) by mouth daily.   FLUoxetine 20 MG capsule Commonly known as:  PROZAC Take 1 capsule (20 mg total) by mouth every morning.   HYDROcodone-acetaminophen 10-325 MG tablet Commonly known as:  NORCO Take 1 tablet by mouth every 6 (six) hours. pain   hydrOXYzine 10 MG tablet Commonly known as:  ATARAX/VISTARIL Take 1 tablet (10 mg total) by mouth 2 (two) times daily as needed for itching.   levothyroxine 25 MCG tablet Commonly known as:  SYNTHROID, LEVOTHROID Take 1 tablet (25 mcg total) by mouth daily before breakfast.   metolazone 2.5 MG tablet Commonly known as:  ZAROXOLYN Take 1 tablet (2.5 mg total) by mouth 2 (two) times a week. Tuesdays and Fridays Start taking on:  10/16/2015   nortriptyline 10 MG capsule Commonly known as:  PAMELOR Take 2 capsules (20 mg total) by mouth at bedtime.   potassium chloride SA 20 MEQ tablet Commonly known as:  K-DUR,KLOR-CON Take 1 tablet (20 mEq total) by mouth daily. Take an additional 20 mEq when taking Metolazone   pregabalin 150 MG capsule Commonly known as:  LYRICA Take 1  capsule (150 mg total) by mouth 3 (three) times daily.   Selexipag 1600 MCG Tabs Commonly known as:  UPTRAVI Take 1,600 mcg by mouth 2 (two) times daily.   torsemide 20 MG tablet Commonly known as:  DEMADEX Take 4 tablets (80 mg total) every morning and 3 tablets (60 mg total) every evening. What changed:  how much to take  how to take this  when to take this  additional instructions       Disposition   The patient will be discharged in stable condition to home. Discharge Instructions    Diet -  low sodium heart healthy    Complete by:  As directed   Heart Failure patients record your daily weight using the same scale at the same time of day    Complete by:  As directed   Increase activity slowly    Complete by:  As directed     Follow-up Information    [name], MD Follow up on 10/21/2015.   Specialty:  Cardiology Why:  at 1000 for post hospital follow up. Please bring all of your medications to your visit. The code for parking is 0003. Contact information: [address] [place] [address] [phone]             Duration of Discharge Encounter: Greater than 35 minutes   Signed, Shirley Friar PA-C 10/15/2015, 2:04 PM

## 2015-10-16 ENCOUNTER — Telehealth: Payer: Self-pay | Admitting: *Deleted

## 2015-10-16 ENCOUNTER — Other Ambulatory Visit: Payer: Self-pay | Admitting: Nurse Practitioner

## 2015-10-16 LAB — TYPE AND SCREEN
ABO/RH(D): O NEG
Antibody Screen: POSITIVE
DAT, IgG: NEGATIVE
UNIT DIVISION: 0
UNIT DIVISION: 0

## 2015-10-16 NOTE — Telephone Encounter (Signed)
LVM for pt to call back. Advised I received refill request and she missed her f/u with CM, NP on 7/10. She needs to call back and r/s before we can refill. We can refill up until f/u. Gave [facility] phone number.

## 2015-10-16 NOTE — Telephone Encounter (Signed)
Please call [facility].

## 2015-10-17 ENCOUNTER — Telehealth: Payer: Self-pay | Admitting: Internal Medicine

## 2015-10-17 DIAGNOSIS — E039 Hypothyroidism, unspecified: Secondary | ICD-10-CM | POA: Diagnosis not present

## 2015-10-17 DIAGNOSIS — I272 Other secondary pulmonary hypertension: Secondary | ICD-10-CM | POA: Diagnosis not present

## 2015-10-17 DIAGNOSIS — N189 Chronic kidney disease, unspecified: Secondary | ICD-10-CM | POA: Diagnosis not present

## 2015-10-17 DIAGNOSIS — D509 Iron deficiency anemia, unspecified: Secondary | ICD-10-CM | POA: Diagnosis not present

## 2015-10-17 DIAGNOSIS — I5032 Chronic diastolic (congestive) heart failure: Secondary | ICD-10-CM | POA: Diagnosis not present

## 2015-10-17 DIAGNOSIS — I130 Hypertensive heart and chronic kidney disease with heart failure and stage 1 through stage 4 chronic kidney disease, or unspecified chronic kidney disease: Secondary | ICD-10-CM | POA: Diagnosis not present

## 2015-10-17 DIAGNOSIS — I480 Paroxysmal atrial fibrillation: Secondary | ICD-10-CM | POA: Diagnosis not present

## 2015-10-17 DIAGNOSIS — M349 Systemic sclerosis, unspecified: Secondary | ICD-10-CM | POA: Diagnosis not present

## 2015-10-17 DIAGNOSIS — E1122 Type 2 diabetes mellitus with diabetic chronic kidney disease: Secondary | ICD-10-CM | POA: Diagnosis not present

## 2015-10-17 DIAGNOSIS — E1140 Type 2 diabetes mellitus with diabetic neuropathy, unspecified: Secondary | ICD-10-CM | POA: Diagnosis not present

## 2015-10-17 NOTE — Telephone Encounter (Signed)
Rn from [facility] requesting VO.  Please call back

## 2015-10-19 DIAGNOSIS — E1140 Type 2 diabetes mellitus with diabetic neuropathy, unspecified: Secondary | ICD-10-CM | POA: Diagnosis not present

## 2015-10-19 DIAGNOSIS — I5032 Chronic diastolic (congestive) heart failure: Secondary | ICD-10-CM | POA: Diagnosis not present

## 2015-10-19 DIAGNOSIS — E1122 Type 2 diabetes mellitus with diabetic chronic kidney disease: Secondary | ICD-10-CM | POA: Diagnosis not present

## 2015-10-19 DIAGNOSIS — I130 Hypertensive heart and chronic kidney disease with heart failure and stage 1 through stage 4 chronic kidney disease, or unspecified chronic kidney disease: Secondary | ICD-10-CM | POA: Diagnosis not present

## 2015-10-19 DIAGNOSIS — D509 Iron deficiency anemia, unspecified: Secondary | ICD-10-CM | POA: Diagnosis not present

## 2015-10-19 DIAGNOSIS — I272 Other secondary pulmonary hypertension: Secondary | ICD-10-CM | POA: Diagnosis not present

## 2015-10-19 DIAGNOSIS — I480 Paroxysmal atrial fibrillation: Secondary | ICD-10-CM | POA: Diagnosis not present

## 2015-10-19 DIAGNOSIS — N189 Chronic kidney disease, unspecified: Secondary | ICD-10-CM | POA: Diagnosis not present

## 2015-10-19 DIAGNOSIS — E039 Hypothyroidism, unspecified: Secondary | ICD-10-CM | POA: Diagnosis not present

## 2015-10-19 DIAGNOSIS — M349 Systemic sclerosis, unspecified: Secondary | ICD-10-CM | POA: Diagnosis not present

## 2015-10-20 DIAGNOSIS — I272 Other secondary pulmonary hypertension: Secondary | ICD-10-CM | POA: Diagnosis not present

## 2015-10-20 DIAGNOSIS — M349 Systemic sclerosis, unspecified: Secondary | ICD-10-CM | POA: Diagnosis not present

## 2015-10-20 DIAGNOSIS — I480 Paroxysmal atrial fibrillation: Secondary | ICD-10-CM | POA: Diagnosis not present

## 2015-10-20 DIAGNOSIS — N189 Chronic kidney disease, unspecified: Secondary | ICD-10-CM | POA: Diagnosis not present

## 2015-10-20 DIAGNOSIS — E1122 Type 2 diabetes mellitus with diabetic chronic kidney disease: Secondary | ICD-10-CM | POA: Diagnosis not present

## 2015-10-20 DIAGNOSIS — I5032 Chronic diastolic (congestive) heart failure: Secondary | ICD-10-CM | POA: Diagnosis not present

## 2015-10-20 DIAGNOSIS — E1140 Type 2 diabetes mellitus with diabetic neuropathy, unspecified: Secondary | ICD-10-CM | POA: Diagnosis not present

## 2015-10-20 DIAGNOSIS — I130 Hypertensive heart and chronic kidney disease with heart failure and stage 1 through stage 4 chronic kidney disease, or unspecified chronic kidney disease: Secondary | ICD-10-CM | POA: Diagnosis not present

## 2015-10-20 DIAGNOSIS — D509 Iron deficiency anemia, unspecified: Secondary | ICD-10-CM | POA: Diagnosis not present

## 2015-10-20 DIAGNOSIS — E039 Hypothyroidism, unspecified: Secondary | ICD-10-CM | POA: Diagnosis not present

## 2015-10-20 NOTE — Telephone Encounter (Signed)
Yes  Dr Darnell Level

## 2015-10-20 NOTE — Telephone Encounter (Signed)
Spoke with Marden Noble and gave VO for 4 weeks at 2x per week. Do you agree?

## 2015-10-21 ENCOUNTER — Ambulatory Visit: Payer: Medicare Other | Admitting: Emergency Medicine

## 2015-10-21 ENCOUNTER — Encounter (HOSPITAL_COMMUNITY): Payer: Medicare Other

## 2015-10-22 ENCOUNTER — Telehealth: Payer: Self-pay | Admitting: *Deleted

## 2015-10-22 NOTE — Telephone Encounter (Signed)
VO given.  THanks.

## 2015-10-22 NOTE — Telephone Encounter (Signed)
[facility] calling for verbal orders to continue care. VO given. Do you agree?

## 2015-10-23 ENCOUNTER — Telehealth: Payer: Self-pay | Admitting: Nurse Practitioner

## 2015-10-23 ENCOUNTER — Telehealth: Payer: Self-pay

## 2015-10-23 DIAGNOSIS — I5032 Chronic diastolic (congestive) heart failure: Secondary | ICD-10-CM | POA: Diagnosis not present

## 2015-10-23 DIAGNOSIS — I480 Paroxysmal atrial fibrillation: Secondary | ICD-10-CM | POA: Diagnosis not present

## 2015-10-23 DIAGNOSIS — I130 Hypertensive heart and chronic kidney disease with heart failure and stage 1 through stage 4 chronic kidney disease, or unspecified chronic kidney disease: Secondary | ICD-10-CM | POA: Diagnosis not present

## 2015-10-23 DIAGNOSIS — E1140 Type 2 diabetes mellitus with diabetic neuropathy, unspecified: Secondary | ICD-10-CM | POA: Diagnosis not present

## 2015-10-23 DIAGNOSIS — E1122 Type 2 diabetes mellitus with diabetic chronic kidney disease: Secondary | ICD-10-CM | POA: Diagnosis not present

## 2015-10-23 DIAGNOSIS — N189 Chronic kidney disease, unspecified: Secondary | ICD-10-CM | POA: Diagnosis not present

## 2015-10-23 DIAGNOSIS — M349 Systemic sclerosis, unspecified: Secondary | ICD-10-CM | POA: Diagnosis not present

## 2015-10-23 DIAGNOSIS — E039 Hypothyroidism, unspecified: Secondary | ICD-10-CM | POA: Diagnosis not present

## 2015-10-23 DIAGNOSIS — D509 Iron deficiency anemia, unspecified: Secondary | ICD-10-CM | POA: Diagnosis not present

## 2015-10-23 DIAGNOSIS — I272 Other secondary pulmonary hypertension: Secondary | ICD-10-CM | POA: Diagnosis not present

## 2015-10-23 NOTE — Telephone Encounter (Signed)
appt made 10-27-15 at 1000. Refill x 1 month, then 90 days when seen.

## 2015-10-23 NOTE — Telephone Encounter (Signed)
Hattie returned Sandy's call, appointment rescheduled from July 10th No Show to Monday August 14th 10:00am with NP Hoyle Sauer.

## 2015-10-23 NOTE — Telephone Encounter (Signed)
reviewed

## 2015-10-23 NOTE — Telephone Encounter (Signed)
Received fax confirmation [facility].

## 2015-10-23 NOTE — Telephone Encounter (Signed)
LMVM for Hattie, sister to call back re: appt for sister.  Last seen 03/2015.  Request received for her lyrica refill, needs to make appt.

## 2015-10-23 NOTE — Telephone Encounter (Signed)
Beth from [facility] requesting to speak with a nurse regarding low bp. Please call back.

## 2015-10-24 ENCOUNTER — Other Ambulatory Visit (HOSPITAL_COMMUNITY): Payer: Self-pay | Admitting: Internal Medicine

## 2015-10-24 ENCOUNTER — Emergency Department (HOSPITAL_COMMUNITY): Payer: Medicare Other

## 2015-10-24 ENCOUNTER — Inpatient Hospital Stay (HOSPITAL_COMMUNITY)
Admission: EM | Admit: 2015-10-24 | Discharge: 2015-10-28 | DRG: 291 | Disposition: A | Discharge status: Hospice | Payer: Medicare Other | Attending: Student in an Organized Health Care Education/Training Program | Admitting: Student in an Organized Health Care Education/Training Program

## 2015-10-24 ENCOUNTER — Encounter (HOSPITAL_COMMUNITY): Payer: Self-pay | Admitting: Emergency Medicine

## 2015-10-24 DIAGNOSIS — K219 Gastro-esophageal reflux disease without esophagitis: Secondary | ICD-10-CM | POA: Diagnosis not present

## 2015-10-24 DIAGNOSIS — E874 Mixed disorder of acid-base balance: Secondary | ICD-10-CM | POA: Diagnosis present

## 2015-10-24 DIAGNOSIS — R0600 Dyspnea, unspecified: Secondary | ICD-10-CM | POA: Diagnosis not present

## 2015-10-24 DIAGNOSIS — D509 Iron deficiency anemia, unspecified: Secondary | ICD-10-CM | POA: Diagnosis present

## 2015-10-24 DIAGNOSIS — N189 Chronic kidney disease, unspecified: Secondary | ICD-10-CM

## 2015-10-24 DIAGNOSIS — N19 Unspecified kidney failure: Secondary | ICD-10-CM

## 2015-10-24 DIAGNOSIS — R0602 Shortness of breath: Secondary | ICD-10-CM | POA: Diagnosis not present

## 2015-10-24 DIAGNOSIS — I1310 Hypertensive heart and chronic kidney disease without heart failure, with stage 1 through stage 4 chronic kidney disease, or unspecified chronic kidney disease: Secondary | ICD-10-CM

## 2015-10-24 DIAGNOSIS — M349 Systemic sclerosis, unspecified: Secondary | ICD-10-CM | POA: Diagnosis not present

## 2015-10-24 DIAGNOSIS — E1122 Type 2 diabetes mellitus with diabetic chronic kidney disease: Secondary | ICD-10-CM | POA: Diagnosis present

## 2015-10-24 DIAGNOSIS — E876 Hypokalemia: Secondary | ICD-10-CM | POA: Diagnosis not present

## 2015-10-24 DIAGNOSIS — I130 Hypertensive heart and chronic kidney disease with heart failure and stage 1 through stage 4 chronic kidney disease, or unspecified chronic kidney disease: Secondary | ICD-10-CM | POA: Diagnosis not present

## 2015-10-24 DIAGNOSIS — I509 Heart failure, unspecified: Secondary | ICD-10-CM | POA: Diagnosis not present

## 2015-10-24 DIAGNOSIS — R579 Shock, unspecified: Secondary | ICD-10-CM | POA: Diagnosis not present

## 2015-10-24 DIAGNOSIS — I440 Atrioventricular block, first degree: Secondary | ICD-10-CM | POA: Diagnosis not present

## 2015-10-24 DIAGNOSIS — Z515 Encounter for palliative care: Secondary | ICD-10-CM | POA: Diagnosis not present

## 2015-10-24 DIAGNOSIS — Z7189 Other specified counseling: Secondary | ICD-10-CM | POA: Diagnosis not present

## 2015-10-24 DIAGNOSIS — R570 Cardiogenic shock: Secondary | ICD-10-CM | POA: Diagnosis present

## 2015-10-24 DIAGNOSIS — K31819 Angiodysplasia of stomach and duodenum without bleeding: Secondary | ICD-10-CM | POA: Diagnosis present

## 2015-10-24 DIAGNOSIS — D638 Anemia in other chronic diseases classified elsewhere: Secondary | ICD-10-CM | POA: Diagnosis not present

## 2015-10-24 DIAGNOSIS — J9601 Acute respiratory failure with hypoxia: Secondary | ICD-10-CM | POA: Diagnosis not present

## 2015-10-24 DIAGNOSIS — Z79899 Other long term (current) drug therapy: Secondary | ICD-10-CM | POA: Diagnosis not present

## 2015-10-24 DIAGNOSIS — E1143 Type 2 diabetes mellitus with diabetic autonomic (poly)neuropathy: Secondary | ICD-10-CM | POA: Diagnosis present

## 2015-10-24 DIAGNOSIS — I272 Other secondary pulmonary hypertension: Secondary | ICD-10-CM | POA: Diagnosis not present

## 2015-10-24 DIAGNOSIS — Z79891 Long term (current) use of opiate analgesic: Secondary | ICD-10-CM | POA: Diagnosis not present

## 2015-10-24 DIAGNOSIS — N184 Chronic kidney disease, stage 4 (severe): Secondary | ICD-10-CM | POA: Diagnosis not present

## 2015-10-24 DIAGNOSIS — I2609 Other pulmonary embolism with acute cor pulmonale: Secondary | ICD-10-CM | POA: Diagnosis not present

## 2015-10-24 DIAGNOSIS — I5033 Acute on chronic diastolic (congestive) heart failure: Secondary | ICD-10-CM | POA: Diagnosis not present

## 2015-10-24 DIAGNOSIS — I5043 Acute on chronic combined systolic (congestive) and diastolic (congestive) heart failure: Secondary | ICD-10-CM | POA: Diagnosis present

## 2015-10-24 DIAGNOSIS — I10 Essential (primary) hypertension: Secondary | ICD-10-CM | POA: Diagnosis present

## 2015-10-24 DIAGNOSIS — E039 Hypothyroidism, unspecified: Secondary | ICD-10-CM | POA: Diagnosis present

## 2015-10-24 DIAGNOSIS — J9620 Acute and chronic respiratory failure, unspecified whether with hypoxia or hypercapnia: Secondary | ICD-10-CM | POA: Diagnosis not present

## 2015-10-24 DIAGNOSIS — I5023 Acute on chronic systolic (congestive) heart failure: Secondary | ICD-10-CM

## 2015-10-24 DIAGNOSIS — K3184 Gastroparesis: Secondary | ICD-10-CM | POA: Diagnosis present

## 2015-10-24 DIAGNOSIS — E785 Hyperlipidemia, unspecified: Secondary | ICD-10-CM | POA: Diagnosis not present

## 2015-10-24 DIAGNOSIS — E8779 Other fluid overload: Secondary | ICD-10-CM

## 2015-10-24 DIAGNOSIS — I480 Paroxysmal atrial fibrillation: Secondary | ICD-10-CM | POA: Diagnosis not present

## 2015-10-24 DIAGNOSIS — Z66 Do not resuscitate: Secondary | ICD-10-CM | POA: Diagnosis present

## 2015-10-24 DIAGNOSIS — J810 Acute pulmonary edema: Secondary | ICD-10-CM

## 2015-10-24 DIAGNOSIS — N179 Acute kidney failure, unspecified: Secondary | ICD-10-CM

## 2015-10-24 DIAGNOSIS — Z96652 Presence of left artificial knee joint: Secondary | ICD-10-CM | POA: Diagnosis present

## 2015-10-24 DIAGNOSIS — I071 Rheumatic tricuspid insufficiency: Secondary | ICD-10-CM | POA: Diagnosis present

## 2015-10-24 DIAGNOSIS — Z452 Encounter for adjustment and management of vascular access device: Secondary | ICD-10-CM | POA: Diagnosis not present

## 2015-10-24 DIAGNOSIS — R410 Disorientation, unspecified: Secondary | ICD-10-CM

## 2015-10-24 LAB — CBC
HCT: 26.5 % — ABNORMAL LOW (ref 36.0–46.0)
Hemoglobin: 8.8 g/dL — ABNORMAL LOW (ref 12.0–15.0)
MCH: 25.2 pg — ABNORMAL LOW (ref 26.0–34.0)
MCHC: 33.2 g/dL (ref 30.0–36.0)
MCV: 75.9 fL — ABNORMAL LOW (ref 78.0–100.0)
PLATELETS: 321 10*3/uL (ref 150–400)
RBC: 3.49 MIL/uL — ABNORMAL LOW (ref 3.87–5.11)
RDW: 21.3 % — AB (ref 11.5–15.5)
WBC: 8.3 10*3/uL (ref 4.0–10.5)

## 2015-10-24 LAB — CBG MONITORING, ED: Glucose-Capillary: 71 mg/dL (ref 65–99)

## 2015-10-24 LAB — LACTIC ACID, PLASMA
Lactic Acid, Venous: 0.5 mmol/L (ref 0.5–1.9)
Lactic Acid, Venous: 0.5 mmol/L (ref 0.5–1.9)

## 2015-10-24 LAB — I-STAT TROPONIN, ED: Troponin i, poc: 0 ng/mL (ref 0.00–0.08)

## 2015-10-24 LAB — BASIC METABOLIC PANEL
ANION GAP: 16 — AB (ref 5–15)
BUN: 106 mg/dL — AB (ref 6–20)
CALCIUM: 8.5 mg/dL — AB (ref 8.9–10.3)
CO2: 19 mmol/L — ABNORMAL LOW (ref 22–32)
CREATININE: 3.45 mg/dL — AB (ref 0.44–1.00)
Chloride: 98 mmol/L — ABNORMAL LOW (ref 101–111)
GFR, EST AFRICAN AMERICAN: 15 mL/min — AB (ref >60)
GFR, EST NON AFRICAN AMERICAN: 13 mL/min — AB (ref >60)
GLUCOSE: 78 mg/dL (ref 65–99)
Potassium: 4 mmol/L (ref 3.5–5.1)
Sodium: 133 mmol/L — ABNORMAL LOW (ref 135–145)

## 2015-10-24 LAB — I-STAT VENOUS BLOOD GAS, ED
ACID-BASE DEFICIT: 4 mmol/L — AB (ref 0.0–2.0)
BICARBONATE: 19.5 meq/L — AB (ref 20.0–24.0)
O2 SAT: 72 %
PO2 VEN: 38 mmHg (ref 31.0–45.0)
TCO2: 20 mmol/L (ref 0–100)
pCO2, Ven: 31.8 mmHg — ABNORMAL LOW (ref 45.0–50.0)
pH, Ven: 7.396 — ABNORMAL HIGH (ref 7.250–7.300)

## 2015-10-24 LAB — MAGNESIUM: MAGNESIUM: 1.4 mg/dL — AB (ref 1.7–2.4)

## 2015-10-24 LAB — TROPONIN I

## 2015-10-24 LAB — I-STAT CG4 LACTIC ACID, ED
LACTIC ACID, VENOUS: 0.52 mmol/L (ref 0.5–1.9)
LACTIC ACID, VENOUS: 0.36 mmol/L — AB (ref 0.5–1.9)

## 2015-10-24 LAB — BRAIN NATRIURETIC PEPTIDE: B Natriuretic Peptide: 796.9 pg/mL — ABNORMAL HIGH (ref 0.0–100.0)

## 2015-10-24 MED ORDER — MAGNESIUM SULFATE IN D5W 1-5 GM/100ML-% IV SOLN
1.0000 g | Freq: Once | INTRAVENOUS | Status: AC
  Administered 2015-10-24: 1 g via INTRAVENOUS
  Filled 2015-10-24: qty 100

## 2015-10-24 MED ORDER — SELEXIPAG 1600 MCG PO TABS: 1600.0000 ug | ORAL_TABLET | Freq: Two times a day (BID) | ORAL | Status: DC

## 2015-10-24 MED ORDER — FLUOXETINE HCL 20 MG PO CAPS
20.0000 mg | ORAL_CAPSULE | Freq: Every morning | ORAL | Status: DC
  Administered 2015-10-25 – 2015-10-27 (×3): 20 mg via ORAL
  Filled 2015-10-24 (×4): qty 1

## 2015-10-24 MED ORDER — AMBRISENTAN 5 MG PO TABS: 10.0000 mg | ORAL_TABLET | Freq: Every day | ORAL | Status: DC

## 2015-10-24 MED ORDER — MILRINONE LACTATE IN DEXTROSE 20-5 MG/100ML-% IV SOLN
0.2500 ug/kg/min | INTRAVENOUS | Status: DC
  Administered 2015-10-24 – 2015-10-27 (×4): 0.25 ug/kg/min via INTRAVENOUS
  Filled 2015-10-24 (×4): qty 100

## 2015-10-24 MED ORDER — ENOXAPARIN SODIUM 30 MG/0.3ML ~~LOC~~ SOLN
30.0000 mg | Freq: Every day | SUBCUTANEOUS | Status: DC
  Administered 2015-10-24 – 2015-10-27 (×4): 30 mg via SUBCUTANEOUS
  Filled 2015-10-24 (×4): qty 0.3

## 2015-10-24 MED ORDER — PANTOPRAZOLE SODIUM 40 MG PO TBEC
40.0000 mg | DELAYED_RELEASE_TABLET | Freq: Every day | ORAL | Status: DC
  Administered 2015-10-25 – 2015-10-27 (×3): 40 mg via ORAL
  Filled 2015-10-24 (×4): qty 1

## 2015-10-24 MED ORDER — AMIODARONE HCL 200 MG PO TABS: 200.0000 mg | ORAL_TABLET | Freq: Every day | ORAL | Status: DC

## 2015-10-24 MED ORDER — LEVOTHYROXINE SODIUM 25 MCG PO TABS
25.0000 ug | ORAL_TABLET | Freq: Every day | ORAL | Status: DC
  Administered 2015-10-25 – 2015-10-27 (×3): 25 ug via ORAL
  Filled 2015-10-24 (×4): qty 1

## 2015-10-24 NOTE — ED Notes (Signed)
Dr. Dolly Rias notified of abnormal PO2 of 38 on VBG

## 2015-10-24 NOTE — ED Provider Notes (Signed)
[facility] DEPT Provider Note   CSN: [id number] Arrival date & time: 10/24/15  1155  First Provider Contact:  None       History   Chief Complaint Chief Complaint  Patient presents with  . Shortness of Breath  . Weakness    HPI [name] is a 65 y.o. female.  The history is provided by the patient, a relative and medical records.   65 year old African-American female with past medical history of severe cor pulmonale secondary to right ventricular failure due to pulmonary hypertension from scleroderma who presents for shortness of breath. Per report from the patient's family, the patient has had progressively worsening shortness of breath over the last week. She has also had mild increase in her lower extremity edema but has not gained any significant weight over the last several days. The patient has become increasingly fatigued and lethargic. She endorses significant shortness of breath and feels "drained." Denies any fevers or chills. Denies any chest pain. Denies any abdominal pain, nausea or vomiting. Denies any alleviating or aggravating factors. Of note, the patient has been also was present for similar symptoms due to acute fluid overload and CHF  Past Medical History:  Diagnosis Date  . Anemia    a.  hx of GI bleed and duodenal AVMs  . Angiodysplasia of colon with hemorrhage   . Arthritis   . Chronic diastolic CHF (congestive heart failure) ([place])    a. Echo 12/23/14 withEF 60-65%, moderately dilated RV, PASP 72, no pericardial effusion.  . Chronic kidney disease   . Dysrhythmia   . Gastroparesis   . GERD (gastroesophageal reflux disease)   . Gout   . Hypothyroidism    a. may be due to amiodarone use. started on synthroid on 12/2014 admission   . Obesity   . PAF (paroxysmal atrial fibrillation) (HCC)    a. not a long term AC candidate due to GI bleeds  . Peripheral neuropathy ([place])   . Scleroderma ([place])   . Secondary pulmonary hypertension ([place])    a. 2/2  scleroderma  . Sickle cell trait ([place])   . Systemic sclerosis ([place])   . Transfusion history    last 06-21-15  . Trichomonas   . Type II diabetes mellitus (HCC)    a. diet control   . Unspecified essential hypertension     Patient Active Problem List   Diagnosis Date Noted  . Protein-calorie malnutrition, severe 09/23/2015  . Malnutrition of moderate degree 08/28/2015  . Colitis 08/27/2015  . Lung disease with systemic sclerosis ([place])   . Other secondary pulmonary hypertension ([place])   . Anemia, iron deficiency   . AVM (arteriovenous malformation) of stomach, acquired   . AVM (arteriovenous malformation) of colon with hemorrhage   . Pressure ulcer 06/20/2015  . Hemorrhage of intestine due to angiodysplasia of intestine   . Blood loss anemia   . Elevated lactic acid level 05/12/2015  . Memory loss 03/19/2015  . Angiodysplasia of colon with hemorrhage   . Iron deficiency anemia 01/24/2015  . Hypothyroidism   . Gout   . PAF (paroxysmal atrial fibrillation) ([place])   . Chronic diastolic CHF (congestive heart failure) ([place])   . Acute on chronic diastolic CHF (congestive heart failure), NYHA class 3 ([place]) 12/23/2014  . Gastrointestinal hemorrhage with melena   . Pulmonary hypertension ([place])   . Pericardial effusion 03/12/2014  . Pleural effusion, bilateral 03/12/2014  . Ascites 03/12/2014  . Elevated d-dimer-VQ negative 12/29 03/12/2014  . CKD (chronic kidney  disease), stage III 01/18/2014  . PAH (pulmonary artery hypertension) ([place]) 12/26/2013  . RVF (right ventricular failure) ([place]) 12/26/2013  . Hypotension 08/03/2013  . Abnormality of gait 06/17/2012  . Routine health maintenance 04/10/2012  . Gastroparesis 01/21/2010  . DYSPHAGIA UNSPECIFIED 01/21/2010  . Hypertriglyceridemia 08/20/2008  . DM type 2 with diabetic peripheral neuropathy ([place]) 08/09/2006  . Essential hypertension 08/09/2006  . GERD 08/09/2006  . SCLERODERMA 08/09/2006    Past Surgical History:  Procedure  Laterality Date  . ABDOMINAL HYSTERECTOMY  1983   "partial"  . CARDIAC CATHETERIZATION Right 04/24/2004  . CARDIAC CATHETERIZATION Left 07/2010  . COLONOSCOPY  09/06/2008, 09/2013   int rrhoids:Dr. Lajoyce Corners 2010. Pan-colonic AVMs, microscopic colitis per Dr Deatra Ina 2015  . COLONOSCOPY N/A 02/24/2015   Procedure: COLONOSCOPY;  Surgeon: Gatha Mayer, MD;  Location: [facility];  Service: Endoscopy;  Laterality: N/A;  . ENTEROSCOPY N/A 05/14/2015   Procedure: ENTEROSCOPY;  Surgeon: Irene Shipper, MD;  Location[facility] ENDOSCOPY;  Service: Endoscopy;  Laterality: N/A;  . ENTEROSCOPY N/A 06/22/2015   Procedure: ENTEROSCOPY;  Surgeon: Doran Stabler, MD;  Location: [facility] ENDOSCOPY;  Service: Endoscopy;  Laterality: N/A;  . ESOPHAGOGASTRODUODENOSCOPY  02/23/2010   D 2 AVM, ablated with APC.   Marland Kitchen ESOPHAGOGASTRODUODENOSCOPY N/A 03/23/2014   Procedure: ESOPHAGOGASTRODUODENOSCOPY (EGD);  Surgeon: Ladene Artist, MD;  Location: [facility] ENDOSCOPY;  Service: Endoscopy;  Laterality: N/A;  . ESOPHAGOGASTRODUODENOSCOPY N/A 02/21/2015   Procedure: ESOPHAGOGASTRODUODENOSCOPY (EGD);  Surgeon: Ladene Artist, MD;  Location: [facility] ENDOSCOPY;  Service: Endoscopy;  Laterality: N/A;  . EXCISIONAL TOTAL KNEE ARTHROPLASTY WITH ANTIBIOTIC SPACERS Left 08/2010   "got infected & had to take 1st replacement out"  . HOT HEMOSTASIS  06/22/2015   Procedure: HOT HEMOSTASIS (ARGON PLASMA COAGULATION/BICAP);  Surgeon: Doran Stabler, MD;  Location: [facility];  Service: Endoscopy;;  . PERICARDIAL TAP N/A 05/03/2014   Procedure: PERICARDIAL TAP;  Surgeon: Sinclair Grooms, MD;  Location: [facility] CATH LAB;  Service: Cardiovascular;  Laterality: N/A;  . PERIPHERALLY INSERTED CENTRAL CATHETER INSERTION  09/2010  . REPLACEMENT TOTAL KNEE Left 11/2000  . REVISION TOTAL KNEE ARTHROPLASTY Left 11/2010   "removed spacers; replaced knee"  . RIGHT HEART CATHETERIZATION N/A 03/18/2014   Procedure: RIGHT HEART CATH;  Surgeon: Larey Dresser, MD;  Location: [facility] CATH  LAB;  Service: Cardiovascular;  Laterality: N/A;  . SHOULDER ARTHROSCOPY Right 12/2009   subacromial decompression  . SHOULDER ARTHROSCOPY W/ ROTATOR CUFF REPAIR Right 01/2010  . TONSILLECTOMY  1960's  . TOTAL KNEE REVISION WITH SCAR DEBRIDEMENT/PATELLA REVISION WITH POLY EXCHANGE Left 12/2010   fell; knee split opened; had to redo revision"  . TUBAL LIGATION      OB History    Gravida Para Term Preterm AB Living   _0 SAB TAB Ectopic Multiple Live Births                   Home Medications    Prior to Admission medications   Medication Sig Start Date End Date Taking? Authorizing Provider  ambrisentan (LETAIRIS) 10 MG tablet Take 1 tablet (10 mg total) by mouth daily. 04/11/15   Larey Dresser, MD  Amino Acids-Protein Hydrolys (FEEDING SUPPLEMENT, PRO-STAT SUGAR FREE 64,) LIQD Take 30 mLs by mouth 3 (three) times daily between meals. 09/24/15   Asencion Partridge, MD  amiodarone (PACERONE) 200 MG tablet Take 1 tablet (200 mg total) by mouth daily. 08/20/15  Larey Dresser, MD  esomeprazole (NEXIUM) 40 MG capsule Take 40 mg by mouth daily at 12 noon.     Historical Provider, MD  fenofibrate 54 MG tablet Take 1 tablet (54 mg total) by mouth daily. 04/07/15   Sid Falcon, MD  FLUoxetine (PROZAC) 20 MG capsule Take 1 capsule (20 mg total) by mouth every morning. 06/18/15   Sid Falcon, MD  HYDROcodone-acetaminophen (NORCO) 10-325 MG tablet Take 1 tablet by mouth every 6 (six) hours. pain 06/17/15   Historical Provider, MD  hydrOXYzine (ATARAX/VISTARIL) 10 MG tablet Take 1 tablet (10 mg total) by mouth 2 (two) times daily as needed for itching. 09/17/15   Sid Falcon, MD  levothyroxine (SYNTHROID, LEVOTHROID) 25 MCG tablet Take 1 tablet (25 mcg total) by mouth daily before breakfast. 12/28/14   Eileen Stanford, PA-C  LYRICA 150 MG capsule take 1 capsule by mouth three times a day 10/23/15   Dennie Bible, NP  metolazone (ZAROXOLYN) 2.5 MG tablet Take 1 tablet (2.5 mg total)  by mouth 2 (two) times a week. Tuesdays and Fridays 10/16/15   Shirley Friar, PA-C  nortriptyline (PAMELOR) 10 MG capsule Take 2 capsules (20 mg total) by mouth at bedtime. 03/19/15   Dennie Bible, NP  potassium chloride SA (K-DUR,KLOR-CON) 20 MEQ tablet Take 1 tablet (20 mEq total) by mouth daily. Take an additional 20 mEq when taking Metolazone 10/15/15   Shirley Friar, PA-C  Riociguat (ADEMPAS) 2.5 MG TABS Take by mouth.    Historical Provider, MD  Selexipag (UPTRAVI) 1600 MCG TABS Take 1,600 mcg by mouth 2 (two) times daily. 10/22/14   Jolaine Artist, MD  torsemide (DEMADEX) 20 MG tablet Take 4 tablets (80 mg total) every morning and 3 tablets (60 mg total) every evening. 10/15/15   Shirley Friar, PA-C    Family History Family History  Problem Relation Age of Onset  . Heart disease Mother   . Diabetes Mother   . Diabetes Sister     Social History Social History  Substance Use Topics  . Smoking status: Never Smoker  . Smokeless tobacco: Never Used  . Alcohol use No     Allergies   Morphine and related; Cephalexin; Ciprofloxacin; Codeine; Contrast media [iodinated diagnostic agents]; and Iohexol   Review of Systems Review of Systems  Constitutional: Positive for fatigue. Negative for chills and fever.  HENT: Negative for congestion and rhinorrhea.   Eyes: Negative for visual disturbance.  Respiratory: Positive for shortness of breath. Negative for cough and wheezing.   Cardiovascular: Positive for leg swelling. Negative for chest pain.  Gastrointestinal: Negative for abdominal pain, diarrhea, nausea and vomiting.  Genitourinary: Negative for dysuria and flank pain.  Musculoskeletal: Negative for neck pain and neck stiffness.  Skin: Negative for rash and wound.  Allergic/Immunologic: Negative for immunocompromised state.  Neurological: Negative for syncope, weakness and headaches.     Physical Exam Updated Vital Signs BP (!) 106/48 (BP  Location: Left Arm)   Pulse 72   Temp 98.6 F (37 C) (Oral)   Resp 16   SpO2 91%   Physical Exam  Constitutional: She is oriented to person, place, and time. She appears well-developed.  Appears severely fatigued, chronically ill, appears older than stated age  HENT:  Head: Normocephalic and atraumatic.  Mouth/Throat: Oropharynx is clear and moist.  Eyes: Conjunctivae are normal. Pupils are equal, round, and reactive to light.  Neck: Neck supple.  Cardiovascular: Normal rate, regular rhythm  and normal heart sounds.  Exam reveals no friction rub.   No murmur heard. Pulmonary/Chest: Effort normal. No respiratory distress. She has no wheezes. She has rales (bibasilar).  Abdominal: Soft. She exhibits no distension. There is no tenderness.  Musculoskeletal: She exhibits edema (1+ pitting edema bilaterally).  Neurological: She is alert and oriented to person, place, and time. She exhibits normal muscle tone.  Skin: Skin is warm. Capillary refill takes less than 2 seconds. No rash noted.  Nursing note and vitals reviewed.    ED Treatments / Results  Labs (all labs ordered are listed, but only abnormal results are displayed) Labs Reviewed  BASIC METABOLIC PANEL - Abnormal; Notable for the following:       Result Value   Sodium 133 (*)    Chloride 98 (*)    CO2 19 (*)    BUN 106 (*)    Creatinine, Ser 3.45 (*)    Calcium 8.5 (*)    GFR calc non Af Amer 13 (*)    GFR calc Af Amer 15 (*)    Anion gap 16 (*)    All other components within normal limits  CBC - Abnormal; Notable for the following:    RBC 3.49 (*)    Hemoglobin 8.8 (*)    HCT 26.5 (*)    MCV 75.9 (*)    MCH 25.2 (*)    RDW 21.3 (*)    All other components within normal limits  URINALYSIS, ROUTINE W REFLEX MICROSCOPIC (NOT AT [facility])  CBG MONITORING, ED    EKG  EKG Interpretation None       Radiology Dg Chest 2 View  Result Date: 10/24/2015 CLINICAL DATA:  65 year old female with a history of swelling  and shortness of breath EXAM: CHEST  2 VIEW COMPARISON:  10/10/2015, 08/26/2015 FINDINGS: Cardiomediastinal silhouette persistently enlarged, similar prior. Calcifications of the aortic arch. Central vascular congestion. Interlobular septal thickening with bilateral mixed interstitial and airspace opacities extending from the hila peripherally. Thickening of the minor fissure. No pneumothorax. IMPRESSION: Cardiomegaly and evidence of congestive heart failure with developing right-sided pleural fluid. Aortic atherosclerosis Signed, Dulcy Fanny. Earleen Newport, DO [facility] Radiology Electronically Signed   By: Corrie Mckusick D.O.   On: 10/24/2015 14:05    Procedures .Critical Care Performed by: Duffy Bruce Authorized by: Duffy Bruce   Critical care provider statement:    Critical care time (minutes):  31   Critical care time was exclusive of:  Separately billable procedures and treating other patients   Critical care was necessary to treat or prevent imminent or life-threatening deterioration of the following conditions:  Cardiac failure, circulatory failure, renal failure and respiratory failure   Critical care was time spent personally by me on the following activities:  Development of treatment plan with patient or surrogate, discussions with consultants, evaluation of patient's response to treatment, examination of patient, obtaining history from patient or surrogate, ordering and performing treatments and interventions, ordering and review of laboratory studies, ordering and review of radiographic studies, pulse oximetry, re-evaluation of patient's condition and review of old charts   I assumed direction of critical care for this patient from another provider in my specialty: no      (including critical care time)  Medications Ordered in ED Medications - No data to display   Initial Impression / Assessment and Plan / ED Course  I have reviewed  the triage vital signs and the nursing notes.  Pertinent labs & imaging results  that were available during my care of the patient were reviewed by me and considered in my medical decision making (see chart for details).  Clinical Course    65 yo F with PMHx of severel RV failure 2/2 pulmonary HTN from scerloderma who p/w acute on chronic SOB, general fatigue. On arrival, pt markedly fatigued but protecting airway. No focal neuro deficits. Exam c/w hypervolemia. Stat labs, imaging sent are remarkable for CXR with pulmonary edema and effusions, and lab work is concerning for AoCKD, likely 2/2 low flow state from CHF. No fevers, lactate is normal, doubt infectious etiology or sepsis. Dr. Sung Amabile of Cardiology consulted and has evaluated pt in ED. Will start on milrinone gtt and admit to StepDown. Pt in agreement with this plan. SHe remains fatigued but protecting airway, and pt is DNR. This is likely 2/2 CHF, also possibly mild uremia though pt poor candidate for dialysis. Will continue milrinone, admit to SDU.  Final Clinical Impressions(s) / ED Diagnoses   Final diagnoses:  Acute on chronic systolic congestive heart failure (HCC)  Acute on chronic kidney failure (HCC)  Confusion  Moderate to severe pulmonary hypertension (HCC)  Other hypervolemia  Acute pulmonary edema (HCC)  Acute respiratory failure with hypoxia [facility])    New Prescriptions New Prescriptions   No medications on file     Duffy Bruce, MD 10/25/15 1127

## 2015-10-24 NOTE — Consult Note (Signed)
PULMONARY / CRITICAL CARE MEDICINE   Name: [name] MRN: [id number] DOB: December 24, 1950    ADMISSION DATE:  10/24/2015 CONSULTATION DATE:  10/24/2015   REFERRING MD:  Dr Myrene Buddy of ER  CHIEF COMPLAINT:  Circulatory failure  HISTORY OF PRESENT ILLNESS:   [name] is a 65 year old woman with scleroderma and severe secondary pulmonary hypertension. In addition she has type 2 diabetes, hyperlipidemia, hypertension, acid reflux, gastroparesis, diastolic heart failure. Initiated [name], Adcirca in the past but unable to tolerate. Finally started on inhaled Tyvaso in 2010. Some delay in titrating up to goal dosing of 9 puffs 4x a day due to compliance issues, but finally able to do so. This was then stopped on her OWN  in Summer 2012 during admission for septic knee hardware due to convenience issues.  Subsequently Rx with with bosentan + Coumadin even through 2013 when she had rising PASP and despite advise resisted Tyvaso. In mid-2014 due to compliance with labs came off both bosentan and coumadin and by this time dyspnea was class 3. Then throuh Fall 2014 and Spring 2015 Rx with macitentan but stopped due to diarrhea. Following this in March 2015 admitted for decompensated corpulmonale and then started on ADCIRCA in 2015 she for me. By Fall (oct)  2015 LETAIRIS was added. Currently 10/24/2015 - adempass, letairis, and selexipag    It appears that progressively by 2014/2015 reluctance, compliance and confusion about meds got progressively worse (all well documenteD) . Also, reading notes and d/w Dr Lamonte Sakai that ECHO has showed progressive worsening pulmonary hypertension (TTE) since 2010 PASP 42 on echp and by dec 2015 - 160mgHG with RV dilatation and pericardial effusoon.  Most recently according to talking to the ER physician and review of cardiology consultation note she was admitted from the clinic 10/09/2015 when she failed to respond to outpatient diuretics.. Echo at that time showed left  ventricle systolic function of 557-32percent, grade 2 diastolic dysfunction and worsening pulmonary hypertension with a peak systolic pressure of 91 mmHg. Trivial pericardial effusion was noted. She was transfused 1 unit packed red blood cell 10/12/2015. She was discharged on torsemide 80 mg in the morning and 60 mg in the evening and metolazone twice weekly. Since that time she diuresed well liters and is down 11 pounds since that time. However she is continued to have worsening shortness of breath particularly today along with some lethargy according to the year physician. Is also reports of orthopnea and dyspnea but no chest pain.   In the ER she was noticed to be in acute on chronic kidney injury with a creatinine of 3.4 find a BUN of 106 and low bicarb Reason baseline was a creatinine 1.98/52 on 10/15/2015]. Chest x-ray showed some evidence of pulmonary congestion. Cardiology felt that she is suffering from a low cardiac output state associated with end-stage pulmonary arterial hypertension cor pulmonale. Cardiologyi sstarting milirnone. Lactate is normal  Off note, she has a history of duodenal arterial venous malformations noted January 2016. And also #2016. Last clipping of the AVMs was April 2017. She is no longer anticoagulated. But she was transfused most recently in July 2017. Also she is consistently refused hospice in the past. Also blood pressures have been soft 920-254systolic in the emergency department which I gather is her baseline    PAST MEDICAL HISTORY :  She  has a past medical history of Anemia; Angiodysplasia of colon with hemorrhage; Arthritis; Chronic diastolic CHF (congestive heart failure) ([place]; Chronic kidney disease; Dysrhythmia; Gastroparesis;  GERD (gastroesophageal reflux disease); Gout; Hypothyroidism; Obesity; PAF (paroxysmal atrial fibrillation) ([place]); Peripheral neuropathy ([place]); Scleroderma ([place]); Secondary pulmonary hypertension ([place]); Sickle cell trait ([place]);  Systemic sclerosis ([place]); Transfusion history; Trichomonas; Type II diabetes mellitus ([place]); and Unspecified essential hypertension.  PAST SURGICAL HISTORY: She  has a past surgical history that includes Tubal ligation; Esophagogastroduodenoscopy (02/23/2010); Shoulder arthroscopy (Right, 12/2009); Replacement total knee (Left, 11/2000); Cardiac catheterization (Right, 04/24/2004); Tonsillectomy (1960's); Abdominal hysterectomy (1983); Shoulder arthroscopy w/ rotator cuff repair (Right, 01/2010); Excisional total knee arthroplasty with antibiotic spacers (Left, 08/2010); Revision total knee arthroplasty (Left, 11/2010); Total knee revision with scar debridement/patella revision with poly exchange (Left, 12/2010); Cardiac catheterization (Left, 07/2010); Peripherally inserted central catheter insertion (09/2010); Colonoscopy (09/06/2008, 09/2013); right heart catheterization (N/A, 03/18/2014); Esophagogastroduodenoscopy (N/A, 03/23/2014); pericardial tap (N/A, 05/03/2014); Esophagogastroduodenoscopy (N/A, 02/21/2015); Colonoscopy (N/A, 02/24/2015); enteroscopy (N/A, 05/14/2015); enteroscopy (N/A, 06/22/2015); and Hot hemostasis (06/22/2015).  Allergies  Allergen Reactions  . Morphine And Related Other (See Comments)    Listed as an allergy, per [facility]  . Cephalexin Rash  . Ciprofloxacin Rash  . Codeine Other (See Comments)     GI upset  . Contrast Media [Iodinated Diagnostic Agents] Hives  . Iohexol Hives     Code: HIVES, Desc: pt breaks out in large hives. needs full premeds, Onset Date: [id number]     No current facility-administered medications on file prior to encounter.    Current Outpatient Prescriptions on File Prior to Encounter  Medication Sig  . ambrisentan (LETAIRIS) 10 MG tablet Take 1 tablet (10 mg total) by mouth daily.  . Amino Acids-Protein Hydrolys (FEEDING SUPPLEMENT, PRO-STAT SUGAR FREE 64,) LIQD Take 30 mLs by mouth 3 (three) times daily between meals.  Marland Kitchen amiodarone (PACERONE) 200 MG tablet  Take 1 tablet (200 mg total) by mouth daily.  Marland Kitchen esomeprazole (NEXIUM) 40 MG capsule Take 40 mg by mouth daily at 12 noon.   . fenofibrate 54 MG tablet Take 1 tablet (54 mg total) by mouth daily.  Marland Kitchen FLUoxetine (PROZAC) 20 MG capsule Take 1 capsule (20 mg total) by mouth every morning.  Marland Kitchen HYDROcodone-acetaminophen (NORCO) 10-325 MG tablet Take 1 tablet by mouth every 6 (six) hours. pain  . hydrOXYzine (ATARAX/VISTARIL) 10 MG tablet Take 1 tablet (10 mg total) by mouth 2 (two) times daily as needed for itching.  . levothyroxine (SYNTHROID, LEVOTHROID) 25 MCG tablet Take 1 tablet (25 mcg total) by mouth daily before breakfast.  . LYRICA 150 MG capsule take 1 capsule by mouth three times a day  . metolazone (ZAROXOLYN) 2.5 MG tablet Take 1 tablet (2.5 mg total) by mouth 2 (two) times a week. Tuesdays and Fridays  . nortriptyline (PAMELOR) 10 MG capsule Take 2 capsules (20 mg total) by mouth at bedtime.  . potassium chloride SA (K-DUR,KLOR-CON) 20 MEQ tablet Take 1 tablet (20 mEq total) by mouth daily. Take an additional 20 mEq when taking Metolazone  . Riociguat (ADEMPAS) 2.5 MG TABS Take by mouth.  . Selexipag (UPTRAVI) 1600 MCG TABS Take 1,600 mcg by mouth 2 (two) times daily.  Marland Kitchen torsemide (DEMADEX) 20 MG tablet Take 4 tablets (80 mg total) every morning and 3 tablets (60 mg total) every evening.    FAMILY HISTORY:  Her indicated that her mother is alive. She indicated that her father is deceased. She indicated that the status of her sister is unknown.    SOCIAL HISTORY: She  reports that she has never smoked. She has never used smokeless tobacco. She reports that she does  not drink alcohol or use drugs.    VITAL SIGNS: BP 101/59 (BP Location: Right Arm)   Pulse 68   Temp 98.6 F (37 C) (Oral)   Resp 15   SpO2 94%   HEMODYNAMICS:    VENTILATOR SETTINGS:    INTAKE / OUTPUT: No intake/output data recorded.  PHYSICAL EXAMINATION: General:  Frail female lying in the bed in the  ER Neuro:  Resting and sleeping HEENT:  Elevated JVP Cardiovascular:  Loud P2 Lungs:  No respiratory distress. Here to auscultation bilaterally Abdomen:  Distended with possible ascites Musculoskeletal:  Chronic edema Skin:  Consistent with scleroderma pattern  LABS: PULMONARY  Recent Labs Lab 10/24/15 1632  HCO3 19.5*  TCO2 20  O2SAT 72.0    CBC  Recent Labs Lab 10/24/15 1210  HGB 8.8*  HCT 26.5*  WBC 8.3  PLT 321    COAGULATION No results for input(s): INR in the last 168 hours.  CARDIAC   Recent Labs Lab 10/24/15 1630  TROPONINI <0.03   No results for input(s): PROBNP in the last 168 hours.   CHEMISTRY  Recent Labs Lab 10/24/15 1210 10/24/15 1630  NA 133*  --   K 4.0  --   CL 98*  --   CO2 19*  --   GLUCOSE 78  --   BUN 106*  --   CREATININE 3.45*  --   CALCIUM 8.5*  --   MG  --  1.4*   Estimated Creatinine Clearance: 14.2 mL/min (by C-G formula based on SCr of 3.45 mg/dL).   LIVER No results for input(s): AST, ALT, ALKPHOS, BILITOT, PROT, ALBUMIN, INR in the last 168 hours.   INFECTIOUS  Recent Labs Lab 10/24/15 1632  LATICACIDVEN 0.52     ENDOCRINE CBG (last 3)   Recent Labs  10/24/15 1214  GLUCAP 71         IMAGING x48h  - image(s) personally visualized  -   highlighted in bold Dg Chest 2 View  Result Date: 10/24/2015 CLINICAL DATA:  65 year old female with a history of swelling and shortness of breath EXAM: CHEST  2 VIEW COMPARISON:  10/10/2015, 08/26/2015 FINDINGS: Cardiomediastinal silhouette persistently enlarged, similar prior. Calcifications of the aortic arch. Central vascular congestion. Interlobular septal thickening with bilateral mixed interstitial and airspace opacities extending from the hila peripherally. Thickening of the minor fissure. No pneumothorax. IMPRESSION: Cardiomegaly and evidence of congestive heart failure with developing right-sided pleural fluid. Aortic atherosclerosis Signed, Dulcy Fanny.  Earleen Newport, DO [facility] Radiology Electronically Signed   By: Corrie Mckusick D.O.   On: 10/24/2015 14:05      ASSESSMENT / PLAN:  PULMONARY A: Severe end-stage pulmonary hypertension arterial due to scleroderma - on 3 drug oral therapy P:   Management per cardiology  CARDIOVASCULAR A:  Low cardiac output state with decompensation P:  Milrinone per cardiology in the stepdown unit  RENAL A:   Acute on chronic kidney insufficiency P:   Reassess this with milrinone  GASTROINTESTINAL A:   Nil acute P:   Per internal medicine teaching service  HEMATOLOGIC A:   Anemia of chronic disease History of AVMs that are prone to bleed in the GI tract P:  Packed red blood cells for hemoglobin less than 7 g percent  INFECTIOUS A:   No evidence of infection P:   Monitor without antibiotics  ENDOCRINE A:   At risk for relative at renal insufficiency   P:   Per teaching service  NEUROLOGIC A:   Lethargy at admit probably due to acute decompensation with pH on the VBG normal P:  Monitor  FAMILY  - Updates: Patient insisted updated the bedside. Discussed with Dr. Haroldine Laws cardiologist: Hospice appropriate but patient always refuses    At this point very little for pulmonary medical care to contributing. She is critically ill but she is okay for stepdown unit. She is at high risk for decompensation from circulatory failure. If she decompensates and she is still full code and we can take her to the intensive care unit. Critical care medicine will sign off   The patient is critically ill with multiple organ systems failure and requires high complexity decision making for assessment and support, frequent evaluation and titration of therapies, application of advanced monitoring technologies and extensive interpretation of multiple databases.   Critical Care Time devoted to patient care services described in this note is  30  Minutes.  This time reflects time of care of this signee Dr Brand Males. This critical care time does not reflect procedure time, or teaching time or supervisory time of PA/NP/Med student/Med Resident etc but could involve care discussion time    Dr. Brand Males, M.D., [facility].C.P Pulmonary and Critical Care Medicine Staff Physician [place] [facility] Pager: [phone], If no answer or between  15:00h - 7:00h: call [phone]  10/24/2015 5:32 PM

## 2015-10-24 NOTE — ED Notes (Signed)
Placed pt on 4 L nasal cannula.  SpO2 77-80% RA

## 2015-10-24 NOTE — H&P (Signed)
Date: 10/24/2015               Patient Name:  [name] MRN: [id number]  DOB: 08-03-1950 Age / Sex: 65 y.o., female   PCP: Sid Falcon, MD         Medical Service: Internal Medicine Teaching Service         Attending Physician: Dr. Bartholomew Crews, MD    First Contact: Dr. Alphonzo Grieve Pager: [phone]  Second Contact: Dr. Rema Jasmine Pager: [phone]       After Hours (After 5p/  First Contact Pager: [phone]  weekends / holidays): Second Contact Pager: [phone]   Chief Complaint: shortness of breath  History of Present Illness: [name] is a 65yo female with past medical history of Type 2 DM, HLD, HTN, pHTN, GERD, gastroparesis, scleroderma, DJD, A fib, and diastolic heart failure presenting to the ED with worsening shortness of breath since recent discharge on 10/15/2015. During that admission she was treated with milrenone with 11lb diuresis. She was sent home on tosemide 42mqam/60qpm and metazolone 2.543mtwice a week. Her ECHO shows EF 5594-70%grade 2 diastolic dysfxn, worsening pulmonary hypertension, and severe TR.  Patient was lethargic on interview so history was obtained from sister who is in the process of becoming POMA. Patient has been increasingly short of breath and lethargic since discharge. Most noticeably has slept through most of the day without meaningful interaction for at least three days. She has been compliant with medications (even the last few days per family).   Patient was able to answer that she does not have chest pain, no cough, no abd pain, no urinary symptoms, and no diarrhea.   BNP 796.9, BMet Na133, K 4, Cl 98, CO2 19, BUN 106, Cr 3.45, GFR 15, anion gap 16, mag 1.4. CBC Hgb 8.8, Hct 26.5. Lactic acid 0.52, venous BG: 7.396/31.8/38/19.5/4. Trop negative x2.  Meds:  Current Meds  Medication Sig  . ambrisentan (LETAIRIS) 10 MG tablet Take 1 tablet (10 mg total) by mouth daily.  . Amino Acids-Protein Hydrolys (FEEDING SUPPLEMENT,  PRO-STAT SUGAR FREE 64,) LIQD Take 30 mLs by mouth 3 (three) times daily between meals.  . Marland Kitchenmiodarone (PACERONE) 200 MG tablet Take 1 tablet (200 mg total) by mouth daily.  . Marland Kitchensomeprazole (NEXIUM) 40 MG capsule Take 40 mg by mouth daily at 12 noon.   . fenofibrate 54 MG tablet Take 1 tablet (54 mg total) by mouth daily.  . Marland KitchenLUoxetine (PROZAC) 20 MG capsule Take 1 capsule (20 mg total) by mouth every morning.  . Marland KitchenYDROcodone-acetaminophen (NORCO) 10-325 MG tablet Take 1 tablet by mouth every 6 (six) hours. pain  . hydrOXYzine (ATARAX/VISTARIL) 10 MG tablet Take 1 tablet (10 mg total) by mouth 2 (two) times daily as needed for itching.  . levothyroxine (SYNTHROID, LEVOTHROID) 25 MCG tablet Take 1 tablet (25 mcg total) by mouth daily before breakfast.  . LYRICA 150 MG capsule take 1 capsule by mouth three times a day  . metolazone (ZAROXOLYN) 2.5 MG tablet Take 1 tablet (2.5 mg total) by mouth 2 (two) times a week. Tuesdays and Fridays  . nortriptyline (PAMELOR) 10 MG capsule Take 2 capsules (20 mg total) by mouth at bedtime.  . potassium chloride SA (K-DUR,KLOR-CON) 20 MEQ tablet Take 1 tablet (20 mEq total) by mouth daily. Take an additional 20 mEq when taking Metolazone  . Riociguat (ADEMPAS) 2.5 MG TABS Take by mouth.  . Selexipag (UPTRAVI) 1600 MCG TABS Take 1,600  mcg by mouth 2 (two) times daily.  Marland Kitchen torsemide (DEMADEX) 20 MG tablet Take 4 tablets (80 mg total) every morning and 3 tablets (60 mg total) every evening.     Allergies: Allergies as of 10/24/2015 - Review Complete 10/24/2015  Allergen Reaction Noted  . Morphine and related Other (See Comments) 09/22/2015  . Cephalexin Rash 05/01/2010  . Ciprofloxacin Rash 07/23/2010  . Codeine Other (See Comments)   . Contrast media [iodinated diagnostic agents] Hives 08/03/2011  . Iohexol Hives 03/04/2008   Past Medical History:  Diagnosis Date  . Anemia    a.  hx of GI bleed and duodenal AVMs  . Angiodysplasia of colon with hemorrhage     . Arthritis   . Chronic diastolic CHF (congestive heart failure) ([place])    a. Echo 12/23/14 withEF 60-65%, moderately dilated RV, PASP 72, no pericardial effusion.  . Chronic kidney disease   . Dysrhythmia   . Gastroparesis   . GERD (gastroesophageal reflux disease)   . Gout   . Hypothyroidism    a. may be due to amiodarone use. started on synthroid on 12/2014 admission   . Obesity   . PAF (paroxysmal atrial fibrillation) ([facility])    a. not a long term AC candidate due to GI bleeds  . Peripheral neuropathy ([place])   . Scleroderma ([place])   . Secondary pulmonary hypertension ([place])    a. 2/2 scleroderma  . Sickle cell trait ([place])   . Systemic sclerosis ([place])   . Transfusion history    last 06-21-15  . Trichomonas   . Type II diabetes mellitus ([facility])    a. diet control   . Unspecified essential hypertension     Family History: Sisters with RA, Mother died of heart failure, Brother had MI  Social History: Never a smoker, denies EtOH and drug use  Review of Systems: A complete ROS was negative except as per HPI.   Physical Exam: Blood pressure (!) 97/54, pulse 71, temperature 98.6 F (37 C), temperature source Oral, resp. rate 16, SpO2 95 %. Constitutional: lethargic, NAD, slow to respond, needs several prompts CV: RRR, systolic murmur on LLSB, no rubs or gallops, no carotid bruits, +JVD Resp: CTAB, diminished breath sounds, no wheezing Abd: soft, distended, +BS, nontender Ext: 2+ lower extremity edema bilaterally, no clubbing; 2+ pulses throughout  EKG: sinus rhythm with 1-degree AV block  CXR: Cardiomegaly, central vascular congestion, right sided pleural infiltrates  Assessment & Plan by Problem: Active Problems:   Acute on chronic heart failure ([facility])   Cardiorenal syndrome with renal failure   Circulatory collapse ([facility])  Acute on chronic diastolic heart failure: Patient has history of diastolic heart failure 2/2 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 Patient presents with  increasing lethargy and shortness of breath since discharge on 8/2 for same reason, when she was treated with milrinone and discharged on torsemide 80/60 BID and metazolone 2.82m twice weekly. Her family maintains compliance with medicines. Patient lives alone and administers own meds, [place]. Previous admission ECHO shows worsening pulmonary edema, grade 2 diastolic heart failure, and severe TR. BMP 798.9, lactate negative, troponins negative x2, EKG shows 1st degree AV block, VBG respiratory alkalosis. --milrinone dosed by weight, selexipag 16034m BID, Ambrisentan 1018maily --daily weights --Per cardio: needs co-ox  PAF: Patient has history of PAF managed on amiodarone. Currently in sinus rhythm --hold amiodarone  Acute on Chronic CKD, stage III: Patient has history of CKD stage 3,  and presented on admission with Cr of 3.45 (with baseline of 1.5-2), BUN 106, and GFR 15. AKI on account of worsening heart failure.  Hypothyroidism: Patient has history of hypothyroidism on replacement with levothyroxine 11mg daily. --Continue levothyroxine 246m daily.  History of GI bleed 2/2 AVM: Patient has history of GI bleed 2/2 AVM's noted 03/23/14, 01/2015 and clipped on 4/17. During last admission had to be transfused with 1U PRBC. Hg at admission is 8.8 stable. --monitor Hgb  Dispo: Admit patient to Inpatient with expected length of stay greater than 2 midnights.  Signed: GoAlphonzo GrieveMD 10/24/2015, 6:30 PM  Pager: [phone]

## 2015-10-24 NOTE — Telephone Encounter (Signed)
LVM to call back.

## 2015-10-24 NOTE — ED Triage Notes (Signed)
Weak and falling sob x 3 days

## 2015-10-24 NOTE — Consult Note (Addendum)
Patient ID: [name] MRN: [id number], DOB/AGE: [birth year]   Admit date: 10/24/2015   Primary Physician: Gilles Chiquito, MD Primary Cardiologist: Dr. Aundra Dubin  Pt. Profile:  [name] is a 65 y.o. female with a history of DM2, HLD, HTN, pHTN, GERD, gastroparesis, scleroderma, DJD and diastolic HF who presented to [facility] ED for evaluation of worsening shortness of breath  HPI: As above. Patient was recently admitted from clinic 10/09/2015 with failure to respond to outpatient diuretics. She history of right-sided heart failure due to severe pulmonary hypertension in setting of her scleroderma. PICC line placed for [place] and CVP. CVP remained as high as 16 despite good diuresis. Suspected this is due to severe TR. Echo during admission shows left ventricular function of 28-78%, grade 2 diastolic dysfunction and worsening pulmonary hypertension with peak pressure of 91 mmHg compared to prior study. Trivial pericardial effusion was noted. Abdominal ultrasound showed minimal ascites. Transfused 1 unit PRBCs on 7/30.The patient was discharged on torsemide 80 qam/60 qpm and metolazone twice a week. Overall she diuresed 12 L and down 11 lbs this admission. Discharge weight was 141 lb.   Since discharge patient continued to have progressive worsening shortness of breath. Compliant with the medication. She was orthopneic and dyspneic. No chest pain. Family has noted lethargy for the past one day and brought to the ER for further evaluation.  In ER creatinine today is 3.45 with BUN 106. It was 1.98/52 at day of discharge 8/2. Hemoglobin stable at 8.8. Chest x-ray shows cardiomegaly with evidence of CHF and right-sided pleural fluid. EKG shows sinus rhythm with first-degree AV block. No acute changes.   Problem List  Past Medical History:  Diagnosis Date  . Anemia    a.  hx of GI bleed and duodenal AVMs  . Angiodysplasia of colon with hemorrhage   . Arthritis   . Chronic diastolic CHF  (congestive heart failure) ([place])    a. Echo 12/23/14 withEF 60-65%, moderately dilated RV, PASP 72, no pericardial effusion.  . Chronic kidney disease   . Dysrhythmia   . Gastroparesis   . GERD (gastroesophageal reflux disease)   . Gout   . Hypothyroidism    a. may be due to amiodarone use. started on synthroid on 12/2014 admission   . Obesity   . PAF (paroxysmal atrial fibrillation) (HCC)    a. not a long term AC candidate due to GI bleeds  . Peripheral neuropathy ([place])   . Scleroderma ([place])   . Secondary pulmonary hypertension ([place])    a. 2/2 scleroderma  . Sickle cell trait ([place])   . Systemic sclerosis ([place])   . Transfusion history    last 06-21-15  . Trichomonas   . Type II diabetes mellitus (HCC)    a. diet control   . Unspecified essential hypertension     Past Surgical History:  Procedure Laterality Date  . ABDOMINAL HYSTERECTOMY  1983   "partial"  . CARDIAC CATHETERIZATION Right 04/24/2004  . CARDIAC CATHETERIZATION Left 07/2010  . COLONOSCOPY  09/06/2008, 09/2013   int rrhoids:Dr. Lajoyce Corners 2010. Pan-colonic AVMs, microscopic colitis per Dr Deatra Ina 2015  . COLONOSCOPY N/A 02/24/2015   Procedure: COLONOSCOPY;  Surgeon: Gatha Mayer, MD;  Location: [place];  Service: Endoscopy;  Laterality: N/A;  . ENTEROSCOPY N/A 05/14/2015   Procedure: ENTEROSCOPY;  Surgeon: Irene Shipper, MD;  Location[facility] ENDOSCOPY;  Service: Endoscopy;  Laterality: N/A;  . ENTEROSCOPY N/A 06/22/2015   Procedure: ENTEROSCOPY;  Surgeon: Estill Cotta  Dorothea Glassman, MD;  Location: [place];  Service: Endoscopy;  Laterality: N/A;  . ESOPHAGOGASTRODUODENOSCOPY  02/23/2010   D 2 AVM, ablated with APC.   Marland Kitchen ESOPHAGOGASTRODUODENOSCOPY N/A 03/23/2014   Procedure: ESOPHAGOGASTRODUODENOSCOPY (EGD);  Surgeon: Ladene Artist, MD;  Location: [facility] ENDOSCOPY;  Service: Endoscopy;  Laterality: N/A;  . ESOPHAGOGASTRODUODENOSCOPY N/A 02/21/2015   Procedure: ESOPHAGOGASTRODUODENOSCOPY (EGD);  Surgeon: Ladene Artist, MD;  Location:  [facility] ENDOSCOPY;  Service: Endoscopy;  Laterality: N/A;  . EXCISIONAL TOTAL KNEE ARTHROPLASTY WITH ANTIBIOTIC SPACERS Left 08/2010   "got infected & had to take 1st replacement out"  . HOT HEMOSTASIS  06/22/2015   Procedure: HOT HEMOSTASIS (ARGON PLASMA COAGULATION/BICAP);  Surgeon: Doran Stabler, MD;  Location: [place];  Service: Endoscopy;;  . PERICARDIAL TAP N/A 05/03/2014   Procedure: PERICARDIAL TAP;  Surgeon: Sinclair Grooms, MD;  Location: [facility] CATH LAB;  Service: Cardiovascular;  Laterality: N/A;  . PERIPHERALLY INSERTED CENTRAL CATHETER INSERTION  09/2010  . REPLACEMENT TOTAL KNEE Left 11/2000  . REVISION TOTAL KNEE ARTHROPLASTY Left 11/2010   "removed spacers; replaced knee"  . RIGHT HEART CATHETERIZATION N/A 03/18/2014   Procedure: RIGHT HEART CATH;  Surgeon: Larey Dresser, MD;  Location: [facility] CATH LAB;  Service: Cardiovascular;  Laterality: N/A;  . SHOULDER ARTHROSCOPY Right 12/2009   subacromial decompression  . SHOULDER ARTHROSCOPY W/ ROTATOR CUFF REPAIR Right 01/2010  . TONSILLECTOMY  1960's  . TOTAL KNEE REVISION WITH SCAR DEBRIDEMENT/PATELLA REVISION WITH POLY EXCHANGE Left 12/2010   fell; knee split opened; had to redo revision"  . TUBAL LIGATION       Allergies  Allergies  Allergen Reactions  . Morphine And Related Other (See Comments)    Listed as an allergy, per [facility]  . Cephalexin Rash  . Ciprofloxacin Rash  . Codeine Other (See Comments)     GI upset  . Contrast Media [Iodinated Diagnostic Agents] Hives  . Iohexol Hives     Code: HIVES, Desc: pt breaks out in large hives. needs full premeds, Onset Date: [id number]      Home Medications  Prior to Admission medications   Medication Sig Start Date End Date Taking? Authorizing Provider  ambrisentan (LETAIRIS) 10 MG tablet Take 1 tablet (10 mg total) by mouth daily. 04/11/15  Yes Larey Dresser, MD  Amino Acids-Protein Hydrolys (FEEDING SUPPLEMENT, PRO-STAT SUGAR FREE 64,) LIQD Take 30 mLs by mouth 3  (three) times daily between meals. 09/24/15  Yes Asencion Partridge, MD  amiodarone (PACERONE) 200 MG tablet Take 1 tablet (200 mg total) by mouth daily. 08/20/15  Yes Larey Dresser, MD  esomeprazole (NEXIUM) 40 MG capsule Take 40 mg by mouth daily at 12 noon.    Yes Historical Provider, MD  fenofibrate 54 MG tablet Take 1 tablet (54 mg total) by mouth daily. 04/07/15  Yes Sid Falcon, MD  FLUoxetine (PROZAC) 20 MG capsule Take 1 capsule (20 mg total) by mouth every morning. 06/18/15  Yes Sid Falcon, MD  HYDROcodone-acetaminophen (NORCO) 10-325 MG tablet Take 1 tablet by mouth every 6 (six) hours. pain 06/17/15  Yes Historical Provider, MD  hydrOXYzine (ATARAX/VISTARIL) 10 MG tablet Take 1 tablet (10 mg total) by mouth 2 (two) times daily as needed for itching. 09/17/15  Yes Sid Falcon, MD  levothyroxine (SYNTHROID, LEVOTHROID) 25 MCG tablet Take 1 tablet (25 mcg total) by mouth daily before breakfast. 12/28/14  Yes Eileen Stanford, PA-C  LYRICA 150 MG capsule take 1 capsule by mouth  three times a day 10/23/15  Yes Dennie Bible, NP  metolazone (ZAROXOLYN) 2.5 MG tablet Take 1 tablet (2.5 mg total) by mouth 2 (two) times a week. Tuesdays and Fridays 10/16/15  Yes Shirley Friar, PA-C  nortriptyline (PAMELOR) 10 MG capsule Take 2 capsules (20 mg total) by mouth at bedtime. 03/19/15  Yes Dennie Bible, NP  potassium chloride SA (K-DUR,KLOR-CON) 20 MEQ tablet Take 1 tablet (20 mEq total) by mouth daily. Take an additional 20 mEq when taking Metolazone 10/15/15  Yes Satira Mccallum Tillery, PA-C  Riociguat (ADEMPAS) 2.5 MG TABS Take by mouth.   Yes Historical Provider, MD  Selexipag (UPTRAVI) 1600 MCG TABS Take 1,600 mcg by mouth 2 (two) times daily. 10/22/14  Yes Jolaine Artist, MD  torsemide (DEMADEX) 20 MG tablet Take 4 tablets (80 mg total) every morning and 3 tablets (60 mg total) every evening. 10/15/15  Yes Shirley Friar, PA-C    Family History  Family History  Problem  Relation Age of Onset  . Heart disease Mother   . Diabetes Mother   . Diabetes Sister    Family Status  Relation Status  . Mother Alive  . Father Deceased  . Sister     Social History  Social History   Social History  . Marital status: Legally Separated    Spouse name: N/A  . Number of children: 2  . Years of education: 11   Occupational History  . Umemployed Disability  . Disability     Social History Main Topics  . Smoking status: Never Smoker  . Smokeless tobacco: Never Used  . Alcohol use No  . Drug use: No  . Sexual activity: No   Other Topics Concern  . Not on file   Social History Narrative    FAMILY HISTORY:  Significant for coronary artery disease and diabetes   Patient lives at home with granddaughter.    Patient has 2 children.    Patient has 11 years of education.    Patient is on disability.    Patient is right handed.    Patient is separated.        All other systems reviewed and are otherwise negative except as noted above.  Physical Exam  Blood pressure (!) 106/50, pulse 64, temperature 98.6 F (37 C), temperature source Oral, resp. rate 15, SpO2 99 %.  General: Lethargic woman NAD nasal cannula in place Psych: Normal affect. Neuro: Answer question appropriately and then goes back to sleep. Awake by arousal. Moves all extremities spontaneously. HEENT: Normal  Neck: Supple without bruits. + JVD. Lungs:  Resp regular and unlabored, diminished breath sound. Heart: RRR + s3,  RV lift s4, 2/6 HSM LLSB.  Abdomen: Soft, non-tender, distended, BS + x 4.  Extremities: No clubbing, cyanosis or edema. DP/PT/Radials 2+ and equal bilaterally. + asterixis  Labs  No results for input(s): CKTOTAL, CKMB, TROPONINI in the last 72 hours. Lab Results  Component Value Date   WBC 8.3 10/24/2015   HGB 8.8 (L) 10/24/2015   HCT 26.5 (L) 10/24/2015   MCV 75.9 (L) 10/24/2015   PLT 321 10/24/2015    Recent Labs Lab 10/24/15 1210  NA 133*  K 4.0  CL  98*  CO2 19*  BUN 106*  CREATININE 3.45*  CALCIUM 8.5*  GLUCOSE 78   Lab Results  Component Value Date   CHOL 117 04/23/2015   HDL 42 04/23/2015   LDLCALC 60 04/23/2015   TRIG 75 04/23/2015  Lab Results  Component Value Date   DDIMER 2.35 (H) 03/12/2014     Radiology/Studies  Dg Chest 2 View  Result Date: 10/24/2015 CLINICAL DATA:  65 year old female with a history of swelling and shortness of breath EXAM: CHEST  2 VIEW COMPARISON:  10/10/2015, 08/26/2015 FINDINGS: Cardiomediastinal silhouette persistently enlarged, similar prior. Calcifications of the aortic arch. Central vascular congestion. Interlobular septal thickening with bilateral mixed interstitial and airspace opacities extending from the hila peripherally. Thickening of the minor fissure. No pneumothorax. IMPRESSION: Cardiomegaly and evidence of congestive heart failure with developing right-sided pleural fluid. Aortic atherosclerosis Signed, Dulcy Fanny. Earleen Newport, DO [facility] Radiology Electronically Signed   By: Corrie Mckusick D.O.   On: 10/24/2015 14:05   US Abdomen Limited  Result Date: 10/10/2015 CLINICAL DATA:  Ascites EXAM: LIMITED ABDOMEN ULTRASOUND FOR ASCITES TECHNIQUE: Limited ultrasound survey for ascites was performed in all four abdominal quadrants. COMPARISON:  CT scan 09/22/2015. FINDINGS: Four quadrant evaluation of the abdomen reveals a small amount of fluid around the liver with trace fluid in the right lower quadrant. IMPRESSION: Insufficient fluid for paracentesis. Electronically Signed   By: Misty Stanley M.D.   On: 10/10/2015 12:01  Dg Chest Port 1 View  Result Date: 10/10/2015 CLINICAL DATA:  Diabetes, CHF EXAM: PORTABLE CHEST 1 VIEW COMPARISON:  08/26/2015 FINDINGS: Stable cardiomegaly with slight vascular and interstitial prominence suggesting mild edema. Minor basilar atelectasis. No large effusion or pneumothorax. Trachea is midline. Overall stable  exam. IMPRESSION: Stable cardiomegaly with vascular congestion versus early edema. Basilar atelectasis. Electronically Signed   By: Jerilynn Mages.  Shick M.D.   On: 10/10/2015 08:11  Echo 10/10/15 LV EF: 55% -   60%  ------------------------------------------------------------------- Indications:      CHF - 428.0.  ------------------------------------------------------------------- History:   PMH:   Atrial fibrillation.  Congestive heart failure. Primary pulmonary hypertension.  Risk factors:  Diabetes mellitus.   ------------------------------------------------------------------- Study Conclusions  - Left ventricle: The cavity size was normal. Systolic function was   normal. The estimated ejection fraction was in the range of 55%   to 60%. Wall motion was normal; there were no regional wall   motion abnormalities. Features are consistent with a pseudonormal   left ventricular filling pattern, with concomitant abnormal   relaxation and increased filling pressure (grade 2 diastolic   dysfunction). - Ventricular septum: Septal motion showed paradox. The contour   showed diastolic flattening and systolic flattening. These   changes are consistent with RV volume and pressure overload. - Aortic valve: There was trivial regurgitation. Valve area (VTI):   1.83 cm^2. Valve area (Vmax): 1.89 cm^2. Valve area (Vmean): 1.79   cm^2. - Mitral valve: There was mild to moderate regurgitation directed   centrally. - Left atrium: The atrium was mildly dilated. - Right ventricle: The cavity size was moderately dilated. Systolic   function was mildly reduced. - Right atrium: The atrium was severely dilated. - Atrial septum: The septum bowed from right to left, consistent   with increased right atrial pressure. - Tricuspid valve: There was malcoaptation of the valve leaflets.   There was severe regurgitation directed centrally. - Pulmonary arteries: Systolic pressure was severely increased. PA   peak  pressure: 91 mm Hg (S). - Pericardium, extracardiac: A trivial pericardial effusion was   identified.  Impressions:  - Compared to October 2016, pulmonary hypertension has worsened   further.  ECG   EKG shows sinus rhythm with first-degree AV block. No acute changes. Vent. rate 75 BPM  PR interval 208 ms QRS duration 102 ms QT/QTc 418/466 ms P-R-T axes 95 100 30   ASSESSMENT AND PLAN  1. R sided HF due to severe pulmonary hypertension in setting of scleroderma - Discharge weight was 141 lb. Increased BUN/creatinine to 106/3.45 from 52/1.98. No weight recorded today. She again failed outpatient medical therapy. Needs co -ox . Dr. Haroldine Laws to see.   2. PAF - Maintaining sinus rhythm. Continue amiodarone.  3. Acute on CKD, stage III - As above  4. 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 AVMs noted 01/2015. AVMs clipped in 4/17. She is no longer anticoagulated.   - Transfused 1 unit of PRBCs during last admission. Hemoglobin stable.  5. Cardiogenic shock  Signed, Bhagat,Bhavinkumar, PA-C 10/24/2015, 3:22 PM    Patient seen and examined with Vin Bhagat, PA-C. We discussed all aspects of the encounter. I agree with the assessment and plan as stated above.   She has end-stage PAH/cor pulmonale on triple therapy. Has refused hospice in past. Recently admitted for volume overload. Now is very lethargic with evidence of low output and lactic acidosis and acute renal failure. Have asked ER to further assess with ABG, lactate and ammonia level. We will start milrinone as BP permits. May need CCM admit. The HF team will follow.   Pamela Traxler,MD 4:05 PM  Addendum: ABG and lactate look ok. Have started milrinone for RV failure. Would likely benefit from central access to manage CVP and co-ox. [place] for [employer].   Pamela Hornaday,MD 6:22 PM

## 2015-10-25 ENCOUNTER — Inpatient Hospital Stay (HOSPITAL_COMMUNITY): Payer: Medicare Other

## 2015-10-25 DIAGNOSIS — I509 Heart failure, unspecified: Secondary | ICD-10-CM

## 2015-10-25 DIAGNOSIS — I480 Paroxysmal atrial fibrillation: Secondary | ICD-10-CM

## 2015-10-25 DIAGNOSIS — I440 Atrioventricular block, first degree: Secondary | ICD-10-CM

## 2015-10-25 DIAGNOSIS — N183 Chronic kidney disease, stage 3 (moderate): Secondary | ICD-10-CM

## 2015-10-25 DIAGNOSIS — E876 Hypokalemia: Secondary | ICD-10-CM

## 2015-10-25 DIAGNOSIS — J9620 Acute and chronic respiratory failure, unspecified whether with hypoxia or hypercapnia: Secondary | ICD-10-CM

## 2015-10-25 LAB — COMPREHENSIVE METABOLIC PANEL
ALBUMIN: 2.4 g/dL — AB (ref 3.5–5.0)
ALT: 10 U/L — AB (ref 14–54)
AST: 24 U/L (ref 15–41)
Alkaline Phosphatase: 109 U/L (ref 38–126)
Anion gap: 15 (ref 5–15)
BUN: 100 mg/dL — AB (ref 6–20)
CALCIUM: 8.3 mg/dL — AB (ref 8.9–10.3)
CO2: 21 mmol/L — ABNORMAL LOW (ref 22–32)
Chloride: 98 mmol/L — ABNORMAL LOW (ref 101–111)
Creatinine, Ser: 2.9 mg/dL — ABNORMAL HIGH (ref 0.44–1.00)
GFR calc Af Amer: 19 mL/min — ABNORMAL LOW (ref >60)
GFR calc non Af Amer: 16 mL/min — ABNORMAL LOW (ref >60)
Glucose, Bld: 104 mg/dL — ABNORMAL HIGH (ref 65–99)
POTASSIUM: 3.1 mmol/L — AB (ref 3.5–5.1)
Sodium: 134 mmol/L — ABNORMAL LOW (ref 135–145)
Total Bilirubin: 1.1 mg/dL (ref 0.3–1.2)
Total Protein: 7 g/dL (ref 6.5–8.1)

## 2015-10-25 LAB — URINALYSIS, ROUTINE W REFLEX MICROSCOPIC
BILIRUBIN URINE: NEGATIVE
Glucose, UA: NEGATIVE mg/dL
Ketones, ur: NEGATIVE mg/dL
NITRITE: NEGATIVE
PROTEIN: NEGATIVE mg/dL
SPECIFIC GRAVITY, URINE: 1.015 (ref 1.005–1.030)
pH: 5 (ref 5.0–8.0)

## 2015-10-25 LAB — BASIC METABOLIC PANEL
ANION GAP: 12 (ref 5–15)
BUN: 94 mg/dL — ABNORMAL HIGH (ref 6–20)
CO2: 23 mmol/L (ref 22–32)
Calcium: 8.5 mg/dL — ABNORMAL LOW (ref 8.9–10.3)
Chloride: 99 mmol/L — ABNORMAL LOW (ref 101–111)
Creatinine, Ser: 2.49 mg/dL — ABNORMAL HIGH (ref 0.44–1.00)
GFR, EST AFRICAN AMERICAN: 22 mL/min — AB (ref >60)
GFR, EST NON AFRICAN AMERICAN: 19 mL/min — AB (ref >60)
GLUCOSE: 113 mg/dL — AB (ref 65–99)
Potassium: 3.2 mmol/L — ABNORMAL LOW (ref 3.5–5.1)
Sodium: 134 mmol/L — ABNORMAL LOW (ref 135–145)

## 2015-10-25 LAB — CBC WITH DIFFERENTIAL/PLATELET
BASOS ABS: 0 10*3/uL (ref 0.0–0.1)
Basophils Relative: 0 %
EOS PCT: 1 %
Eosinophils Absolute: 0.1 10*3/uL (ref 0.0–0.7)
HCT: 25.3 % — ABNORMAL LOW (ref 36.0–46.0)
Hemoglobin: 8.3 g/dL — ABNORMAL LOW (ref 12.0–15.0)
LYMPHS PCT: 13 %
Lymphs Abs: 1 10*3/uL (ref 0.7–4.0)
MCH: 24.3 pg — AB (ref 26.0–34.0)
MCHC: 32.8 g/dL (ref 30.0–36.0)
MCV: 74 fL — ABNORMAL LOW (ref 78.0–100.0)
MONO ABS: 0.5 10*3/uL (ref 0.1–1.0)
Monocytes Relative: 7 %
Neutro Abs: 6.6 10*3/uL (ref 1.7–7.7)
Neutrophils Relative %: 80 %
PLATELETS: 352 10*3/uL (ref 150–400)
RBC: 3.42 MIL/uL — ABNORMAL LOW (ref 3.87–5.11)
RDW: 21 % — AB (ref 11.5–15.5)
WBC: 8.3 10*3/uL (ref 4.0–10.5)

## 2015-10-25 LAB — URINE MICROSCOPIC-ADD ON

## 2015-10-25 LAB — CARBOXYHEMOGLOBIN
CARBOXYHEMOGLOBIN: 1.7 % — AB (ref 0.5–1.5)
METHEMOGLOBIN: 1 % (ref 0.0–1.5)
O2 SAT: 64.2 %
Total hemoglobin: 11.3 g/dL — ABNORMAL LOW (ref 12.0–16.0)

## 2015-10-25 LAB — MRSA PCR SCREENING: MRSA by PCR: NEGATIVE

## 2015-10-25 LAB — MAGNESIUM
Magnesium: 1.5 mg/dL — ABNORMAL LOW (ref 1.7–2.4)
Magnesium: 1.9 mg/dL (ref 1.7–2.4)

## 2015-10-25 MED ORDER — MAGNESIUM SULFATE 2 GM/50ML IV SOLN
2.0000 g | Freq: Once | INTRAVENOUS | Status: AC
  Administered 2015-10-25: 2 g via INTRAVENOUS
  Filled 2015-10-25: qty 50

## 2015-10-25 MED ORDER — FUROSEMIDE 10 MG/ML IJ SOLN
80.0000 mg | Freq: Two times a day (BID) | INTRAMUSCULAR | Status: DC
  Administered 2015-10-25 – 2015-10-28 (×7): 80 mg via INTRAVENOUS
  Filled 2015-10-25 (×7): qty 8

## 2015-10-25 MED ORDER — RIOCIGUAT 2.5 MG PO TABS
2.5000 mg | ORAL_TABLET | Freq: Three times a day (TID) | ORAL | Status: DC
  Administered 2015-10-25 – 2015-10-28 (×8): 2.5 mg via ORAL
  Filled 2015-10-25 (×2): qty 1

## 2015-10-25 MED ORDER — POTASSIUM CHLORIDE 10 MEQ/100ML IV SOLN
10.0000 meq | INTRAVENOUS | Status: AC
  Administered 2015-10-25 (×4): 10 meq via INTRAVENOUS
  Filled 2015-10-25 (×2): qty 100

## 2015-10-25 MED ORDER — SELEXIPAG 1600 MCG PO TABS
1600.0000 ug | ORAL_TABLET | Freq: Two times a day (BID) | ORAL | Status: DC
  Administered 2015-10-25 – 2015-10-27 (×5): 1600 ug via ORAL
  Filled 2015-10-25 (×3): qty 1

## 2015-10-25 MED ORDER — HYDROCODONE-ACETAMINOPHEN 10-325 MG PO TABS
1.0000 | ORAL_TABLET | Freq: Once | ORAL | Status: AC
  Administered 2015-10-25: 1 via ORAL
  Filled 2015-10-25: qty 1

## 2015-10-25 MED ORDER — POTASSIUM CHLORIDE CRYS ER 20 MEQ PO TBCR
40.0000 meq | EXTENDED_RELEASE_TABLET | Freq: Two times a day (BID) | ORAL | Status: DC
  Administered 2015-10-25 – 2015-10-27 (×4): 40 meq via ORAL
  Filled 2015-10-25 (×3): qty 2
  Filled 2015-10-25: qty 4

## 2015-10-25 MED ORDER — AMBRISENTAN 10 MG PO TABS
10.0000 mg | ORAL_TABLET | Freq: Every day | ORAL | Status: DC
  Administered 2015-10-25 – 2015-10-27 (×3): 10 mg via ORAL
  Filled 2015-10-25 (×2): qty 1

## 2015-10-25 MED ORDER — ACETAMINOPHEN 500 MG PO TABS
500.0000 mg | ORAL_TABLET | Freq: Once | ORAL | Status: AC
  Administered 2015-10-25: 500 mg via ORAL
  Filled 2015-10-25: qty 1

## 2015-10-25 MED ORDER — AMBRISENTAN 5 MG PO TABS
10.0000 mg | ORAL_TABLET | Freq: Every day | ORAL | Status: DC
  Filled 2015-10-25 (×2): qty 2

## 2015-10-25 NOTE — Progress Notes (Signed)
Peripherally Inserted Central Catheter/Midline Placement  The IV Nurse has discussed with the patient and/or persons authorized to consent for the patient, the purpose of this procedure and the potential benefits and risks involved with this procedure.  The benefits include less needle sticks, lab draws from the catheter, ability to perform [facility] exchange if ordered by the physician and patient may be discharged home with the catheter.  Risks include, but not limited to, infection, bleeding, blood clot (thrombus formation), and puncture of an artery; nerve damage and irregular heart beat.  Alternatives to this procedure were also discussed.  Bard educational information left at bedside.  PICC/Midline Placement Documentation  PICC Double Lumen 03/70/96 PICC Right Basilic 42 cm 2 cm (Active)  Indication for Insertion or Continuance of Line Vasoactive infusions;Prolonged intravenous therapies;Limited venous access - need for IV therapy >5 days (PICC only) 10/25/2015  1:00 PM  Exposed Catheter (cm) 2 cm 10/25/2015  1:00 PM  Site Assessment Clean;Dry;Intact 10/25/2015  1:00 PM  Lumen #1 Status Flushed;Saline locked;Blood return noted 10/25/2015  1:00 PM  Lumen #2 Status Saline locked;Flushed;Blood return noted 10/25/2015  1:00 PM  Dressing Type Transparent 10/25/2015  1:00 PM  Dressing Status Clean;Dry;Intact;Antimicrobial disc in place 10/25/2015  1:00 PM  Line Care Connections checked and tightened 10/25/2015  1:00 PM  Line Adjustment (NICU/IV Team Only) No 10/25/2015  1:00 PM  Dressing Intervention New dressing 10/25/2015  1:00 PM  Dressing Change Due 11/01/15 10/25/2015  1:00 PM       Rolena Infante 10/25/2015, 1:01 PM

## 2015-10-25 NOTE — Progress Notes (Signed)
  Date: 10/25/2015  Patient name: [name]  Medical record number: [id number]  Date of birth: Oct 31, 1950   I have seen and evaluated [name] and discussed their care with the Residency Team. [name] is a 65 yo with scleroderma, end stage Pul HTN, end stage diastolic hear failure along with Dm, HTN, and paroxymal A Fib. Pt was having resp distress this AM so info obtained from chart. Pt was admitted in July for acute on chronic diastolic HF and treated with lasix and diuresed 11 lbs. She was d/C'd on 8/2 and never did well at home with increasing dyspnea, lethargy, sleeping, and inability to administer her meds. She was brought to the ED and eval by HF team and put on milrinone but not lasix 2/2 BP.   PMHx, Fam Hx, and/or Soc Hx : Does not smoke. Lives with sisters. Fam hx sig for RA and heart disease  Vitals:   10/25/15 0406 10/25/15 0759  BP: (!) 100/58   Pulse: 83   Resp: 20 19  Temp: 98.7 F (37.1 C) 98.1 F (36.7 C)  Urine output 300 cc in 12 hrs or 25 cc/hr  Chronically ill, mild resp distress, using accessory muscles to breath HRRR, systolic murmur L good air flow but crackles on R Ext + 1 edema B Skin no changes Neuro alert conversant  Cr 3.45 - 2.9 Gap 15 Bicarb 21 HgB 8.3 BNP 769  I personally viewed her CXR images and confirmed by reading with the official read. CM with pul edema  I personally viewed her EKG and confirmed by reading with the official read. Sinus, RBBB, 1st degree AV block  Assessment and Plan: I have seen and evaluated the patient as outlined above. I agree with the formulated Assessment and Plan as detailed in the residents' note, with the following changes: [name] is a 65 yo with scleroderma, end stage Pul HTN, diastolic hear failure along with Dm, HTN, and paroxymal A Fib. She was just D/c'd on 8/2 for cariogenic shock requiring lasix along with her home PAH infusions. She failed transition to the outpt setting and is back with  cardiogenic shock. Her prognosis is poor and notes indicate she has not been receptive to hospice in the recent past. Since she is not able to maintain her volume status at home, team will need to cont to address goals of care.  1. Cardiogenic shock 2/2 acute on chronic diastolic heart failure and PAH - Appreciate HF team's rec for milrinone and lasix. BP stable now. Cr trended down but still above baseline. Will need to follow now that lasix started today.  2. Acute respiratory failure - hypoxia never documented but she is on supplemental O2. This is 2/2 #1. Should respond to diuresis. We discussed MSO4 to decrease air hunger but will hold off since lasix started.   3. Acute chronic renal failure - should respond to cardiac support. Follow cr.  Overall prognosis is poor and GOC discussions key. Team arranging to speak to sisters.   Bartholomew Crews, MD 8/12/201711:22 AM

## 2015-10-25 NOTE — Progress Notes (Signed)
Advanced Heart Failure Rounding Note   Subjective:    Admitted yesterday for recurrent RHF and AKI. Started on milrinone.   Remains lethargic but says she feels a little better. Denies dyspnea. Creatinine somewhat improved.     Objective:   Weight Range:  Vital Signs:   Temp:  [98 F (36.7 C)-98.9 F (37.2 C)] 98.1 F (36.7 C) (08/12 0759) Pulse Rate:  [64-96] 83 (08/12 0406) Resp:  [13-20] 19 (08/12 0759) BP: (85-106)/(47-59) 100/58 (08/12 0406) SpO2:  [81 %-100 %] 94 % (08/12 0406) Weight:  [66 kg (145 lb 6.4 oz)-66.1 kg (145 lb 12.8 oz)] 66.1 kg (145 lb 12.8 oz) (08/12 0406) Last BM Date: 10/24/15  Weight change: Filed Weights   10/24/15 2248 10/25/15 0406  Weight: 66 kg (145 lb 6.4 oz) 66.1 kg (145 lb 12.8 oz)    Intake/Output:   Intake/Output Summary (Last 24 hours) at 10/25/15 0923 Last data filed at 10/25/15 0908  Gross per 24 hour  Intake           534.16 ml  Output              300 ml  Net           234.16 ml     Physical Exam  General: Lethargic. Arouses to conversation. NAD Psych: Normal affect. Neuro: Answer question appropriately and then goes back to sleep. Awake by arousal. Moves all extremities spontaneously. HEENT: Normal                     Neck: Supple without bruits. + JVD. Lungs:  Resp regular and unlabored, diminished breath sound. Heart: RRR + s3,  RV lift s4, 2/6 HSM LLSB.  Abdomen: Soft, non-tender, + distended, BS + x 4.  Extremities: No clubbing, cyanosis or 2+ edema. DP/PT/Radials 2+ and equal bilaterally. + mild asterixis   Telemetry: NSR   Labs: Basic Metabolic Panel:  Recent Labs Lab 10/24/15 1210 10/24/15 1630 10/25/15 0518  NA 133*  --  134*  K 4.0  --  3.1*  CL 98*  --  98*  CO2 19*  --  21*  GLUCOSE 78  --  104*  BUN 106*  --  100*  CREATININE 3.45*  --  2.90*  CALCIUM 8.5*  --  8.3*  MG  --  1.4* 1.5*    Liver Function Tests:  Recent Labs Lab 10/25/15 0518  AST 24  ALT 10*  ALKPHOS 109    BILITOT 1.1  PROT 7.0  ALBUMIN 2.4*   No results for input(s): LIPASE, AMYLASE in the last 168 hours. No results for input(s): AMMONIA in the last 168 hours.  CBC:  Recent Labs Lab 10/24/15 1210 10/25/15 0518  WBC 8.3 8.3  NEUTROABS  --  6.6  HGB 8.8* 8.3*  HCT 26.5* 25.3*  MCV 75.9* 74.0*  PLT 321 352    Cardiac Enzymes:  Recent Labs Lab 10/24/15 1630  TROPONINI <0.03    BNP: BNP (last 3 results)  Recent Labs  07/29/15 1304 10/03/15 1435 10/24/15 1629  BNP 589.8* 637.4* 796.9*    ProBNP (last 3 results) No results for input(s): PROBNP in the last 8760 hours.    Other results:  Imaging: Dg Chest 2 View  Result Date: 10/24/2015 CLINICAL DATA:  65 year old female with a history of swelling and shortness of breath EXAM: CHEST  2 VIEW COMPARISON:  10/10/2015, 08/26/2015 FINDINGS: Cardiomediastinal silhouette persistently enlarged, similar prior. Calcifications of the aortic arch.  Central vascular congestion. Interlobular septal thickening with bilateral mixed interstitial and airspace opacities extending from the hila peripherally. Thickening of the minor fissure. No pneumothorax. IMPRESSION: Cardiomegaly and evidence of congestive heart failure with developing right-sided pleural fluid. Aortic atherosclerosis Signed, Dulcy Fanny. Earleen Newport, DO [facility]iology Electronically Signed   By: Corrie Mckusick D.O.   On: 10/24/2015 14:05      Medications:     Scheduled Medications: . ambrisentan  10 mg Oral Daily  . enoxaparin (LOVENOX) injection  30 mg Subcutaneous QHS  . FLUoxetine  20 mg Oral q morning - 10a  . levothyroxine  25 mcg Oral QAC breakfast  . magnesium sulfate 1 - 4 g bolus IVPB  2 g Intravenous Once  . pantoprazole  40 mg Oral Daily  . Selexipag  1,600 mcg Oral BID     Infusions: . milrinone 0.25 mcg/kg/min (10/24/15 2300)     PRN Medications:     Assessment:   1. Recurrent RV  failure/cor pulmonale with shock 2. 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 A/C CKD, stage IV 4. H/o GI bleed  Plan/Discussion:     She has end-stage PAH/cor pulmonale on triple therapy. Readmitted yesterday for recurrent shock. Mildly  improved on milrinone but still quite tenuous. She has refused hospice in past. Will continue milrinone for now. Place PICC to check co-ox and CVP. Continue diuresis and PAH meds. Start diuresis. Sup K+.  Will continue to discuss need for Hospice with her. Dr. Aundra Dubin will return tomorrow who knows her well.     Length of Stay: 1 Debarah Mccumbers MD 10/25/2015, 9:23 AM  Advanced Heart Failure Team Pager [phone] (M-F; [place])  Please contact [facility] for night-coverage after hours (4p -7a ) and weekends on [email or web address]

## 2015-10-25 NOTE — Progress Notes (Signed)
   Subjective: Patient is more alert and responsive this morning. She states that it is hard to breathe, but denies shortness of breath. She affirmed she is DNR and did not want "life support".   [name] sister, POMA, will be back to the hospital this afternoon.  Objective:  Vital signs in last 24 hours: Vitals:   10/24/15 2248 10/24/15 2344 10/25/15 0406 10/25/15 0759  BP: (!) 106/53 (!) 105/56 (!) 100/58   Pulse: 75 79 83   Resp: _0 Temp: 98.9 F (37.2 C) 98 F (36.7 C) 98.7 F (37.1 C) 98.1 F (36.7 C)  TempSrc: Oral Oral Oral Oral  SpO2: 94% 91% 94%   Weight: 66 kg (145 lb 6.4 oz)  66.1 kg (145 lb 12.8 oz)   Height: _1  (1.626 m)      Constitutional: more responsive, NAD, slow to respond CV: RRR, systolic murmur on LLSB, no rubs or gallops, no carotid bruits, +JVD Resp: coarse crackles appreciated bilaterally R>L, somewhat labored breathing but patient is able to converse Abd: soft, distended, +BS, nontender Ext: 2+ lower extremity edema bilaterally, no clubbing; 2+ pulses throughout  Assessment/Plan:  Active Problems:   Essential hypertension   Anemia, iron deficiency   AVM (arteriovenous malformation) of stomach, acquired   Acute on chronic heart failure (HCC)   Cardiorenal syndrome with renal failure   Circulatory collapse (HCC)  Acute on chronic diastolic heart failure: Patient has history of diastolic heart failure 2/2 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 Patient presents with increasing lethargy and shortness of breath since discharge on 8/2 for same reason, when she was treated with milrinone and discharged on torsemide 80/60 BID and metazolone 2.63m twice weekly. Her family maintains compliance with medicines. Patient lives alone and administers own meds, and has [facility] a week to take vitals. Previous admission ECHO shows worsening pulmonary edema, grade 2 diastolic heart failure, and severe TR. BMP 798.9, lactate negative,  troponins negative x2, EKG shows 1st degree AV block, VBG respiratory alkalosis. --milrinone dosed by weight, selexipag 16052m BID, Ambrisentan 1038maily --daily weights --diurese with lasix 73m39mV BID --Per cardio: needs PICC for co-ox and CVP  PAF: Patient has history of PAF managed on amiodarone. Currently in sinus rhythm --hold amiodarone  Acute on Chronic CKD, stage III: Patient has history of CKD stage 3, and presented on admission with Cr of 3.45 (with baseline of 1.5-2), BUN 106, and GFR 15. AKI on account of worsening heart failure. Cr to 2.9 this morning improved with just milrenone.  --Lasix   HypoK/hypoMg: K 5.0 on admission to 3.1 this AM, Magnesium 1.5 this AM -Replacing with KDUR 40mE81m BID + KCl 10mEq4m and MgSulfate 1g. -f/u BMET and Mg at 1700, replace as needed.  Dispo: Anticipated discharge in approximately 5-6 day(s).   GoricaAlphonzo Grieve/02/2016, 10:32 AM Pager: [phone]

## 2015-10-26 DIAGNOSIS — Z7189 Other specified counseling: Secondary | ICD-10-CM

## 2015-10-26 DIAGNOSIS — R0600 Dyspnea, unspecified: Secondary | ICD-10-CM

## 2015-10-26 DIAGNOSIS — I5033 Acute on chronic diastolic (congestive) heart failure: Secondary | ICD-10-CM

## 2015-10-26 DIAGNOSIS — Z515 Encounter for palliative care: Secondary | ICD-10-CM

## 2015-10-26 DIAGNOSIS — I5023 Acute on chronic systolic (congestive) heart failure: Secondary | ICD-10-CM

## 2015-10-26 LAB — CBC
HCT: 24 % — ABNORMAL LOW (ref 36.0–46.0)
Hemoglobin: 8 g/dL — ABNORMAL LOW (ref 12.0–15.0)
MCH: 25.2 pg — AB (ref 26.0–34.0)
MCHC: 33.3 g/dL (ref 30.0–36.0)
MCV: 75.5 fL — ABNORMAL LOW (ref 78.0–100.0)
PLATELETS: 304 10*3/uL (ref 150–400)
RBC: 3.18 MIL/uL — ABNORMAL LOW (ref 3.87–5.11)
RDW: 20.8 % — ABNORMAL HIGH (ref 11.5–15.5)
WBC: 9.1 10*3/uL (ref 4.0–10.5)

## 2015-10-26 LAB — BASIC METABOLIC PANEL
Anion gap: 13 (ref 5–15)
BUN: 86 mg/dL — ABNORMAL HIGH (ref 6–20)
CO2: 23 mmol/L (ref 22–32)
CREATININE: 2.25 mg/dL — AB (ref 0.44–1.00)
Calcium: 8.6 mg/dL — ABNORMAL LOW (ref 8.9–10.3)
Chloride: 99 mmol/L — ABNORMAL LOW (ref 101–111)
GFR calc non Af Amer: 22 mL/min — ABNORMAL LOW (ref >60)
GFR, EST AFRICAN AMERICAN: 25 mL/min — AB (ref >60)
Glucose, Bld: 89 mg/dL (ref 65–99)
POTASSIUM: 3.6 mmol/L (ref 3.5–5.1)
SODIUM: 135 mmol/L (ref 135–145)

## 2015-10-26 LAB — CARBOXYHEMOGLOBIN
Carboxyhemoglobin: 1.8 % — ABNORMAL HIGH (ref 0.5–1.5)
Methemoglobin: 0.9 % (ref 0.0–1.5)
O2 SAT: 71 %
TOTAL HEMOGLOBIN: 8.3 g/dL — AB (ref 12.0–16.0)

## 2015-10-26 MED ORDER — MORPHINE SULFATE (PF) 2 MG/ML IV SOLN
2.0000 mg | INTRAVENOUS | Status: DC | PRN
  Administered 2015-10-26 – 2015-10-28 (×9): 2 mg via INTRAVENOUS
  Filled 2015-10-26 (×9): qty 1

## 2015-10-26 MED ORDER — MORPHINE SULFATE (PF) 2 MG/ML IV SOLN
1.0000 mg | Freq: Once | INTRAVENOUS | Status: AC
  Administered 2015-10-26: 1 mg via INTRAVENOUS
  Filled 2015-10-26: qty 1

## 2015-10-26 MED ORDER — MORPHINE SULFATE (PF) 2 MG/ML IV SOLN: 1.0000 mg | Freq: Once | INTRAVENOUS | Status: AC

## 2015-10-26 MED ORDER — AMIODARONE HCL 200 MG PO TABS
200.0000 mg | ORAL_TABLET | Freq: Every day | ORAL | Status: DC
  Administered 2015-10-26 – 2015-10-27 (×2): 200 mg via ORAL
  Filled 2015-10-26 (×3): qty 1

## 2015-10-26 NOTE — Progress Notes (Signed)
[facility]  Patient Status: Active with visits up until hospitalization  [facility] is providing the following services: SN,PT  If patient discharges after hours, please call [phone].   [name] 10/26/2015, 10:03 AM

## 2015-10-26 NOTE — Progress Notes (Signed)
Subjective:  Patient continues to have SOB but slightly better than yesterday. Vitals stable on milrinone gtt. Co-ox improving, weight stable.  Had family meeting today with her sisters and patient in room. In summary, they are agreeable to home hospice. See below for details.   Objective:  Vital signs in last 24 hours: Vitals:   10/25/15 2347 10/26/15 0045 10/26/15 0423 10/26/15 0743  BP: (!) 95/59  (!) 100/49   Pulse: 82  77   Resp: _0 Temp: 98.6 F (37 C)  97.8 F (36.6 C) 98.6 F (37 C)  TempSrc: Oral  Oral Oral  SpO2: 96% (!) 84% 97%   Weight:   145 lb 3.2 oz (65.9 kg)   Height:       Constitutional: more responsive, NAD, slow to respond CV: RRR, systolic murmur on LLSB, no rubs or gallops, no carotid bruits, +JVD Resp: coarse crackles appreciated bilaterally R>L, somewhat labored breathing but patient is able to converse Abd: soft, distended, +BS, nontender Ext: 2+ lower extremity edema bilaterally, no clubbing; 2+ pulses throughout  Assessment/Plan:  Active Problems:   Essential hypertension   Anemia, iron deficiency   AVM (arteriovenous malformation) of stomach, acquired   Acute on chronic heart failure (HCC)   Cardiorenal syndrome with renal failure   Circulatory collapse (HCC)  Acute on chronic end stage R sided heart failure with shock: Patient has history of diastolic heart failure 2/2 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 Presented with increasing lethargy and shortness of breath since discharge on 8/2 for same reason, when she was treated with milrinone and discharged on torsemide 80/60 BID and metazolone 2.10m twice weekly. Her family maintains compliance with medicines.Last ECHO showed worsening pulmonary edema, grade 2 diastolic heart failure, severe TR, and high PA pressure of 91.  CO-ox improving on milirinone and lasix, kidney function also improving - appreciate CHF team recs. Cont milirinone and lasix per their recommendation for now. We  may be in the process of transitioning her to more comfort measures soon. They are agreeable to talk with hospice providers. Cont treatment until final decision is made about hospice.  - elexipag 16047m BID, Ambrisentan 1053maily --daily weights, Co-ox, and CVP.   PAF: Patient has history of PAF managed on amiodarone. Currently in sinus rhythm --hold amiodarone  Acute on Chronic CKD, stage III: Patient has history of CKD stage 3, and presented on admission with Cr of 3.45 (with baseline of 1.5-2), BUN 106, and GFR 15. This is likely 2/2 to cardiorenal etiology - improving with above treatment.   HypoK/hypoMg:  - repleting as needed.   GOC: Had family meeting today (10/26/15 morning) with her sisters and patient in room. Discussed her overall picture of end stage Right sided CHF and 7 admission within this year. Discussed her goals. She mentioned that if she has the choice, she would to spend the last hours of her life at home. Also mentioned that she would like to live the remaining part of her live in dignity and peace rather than in the hospital. I explained that with her medical condition, she would be at high risk of frequently ending up in the hospital and the treatments we can offer her will not reverse her condition, they are all temporary. Patient already expressed to her sisters that she does not want to be on life support/ventilaltors, and expressed DNR as her code status. I recommended home hospice for the patient. The patient and her sisters,  including HPOA Ver[employer]ical [employer]wer  sister) are in agreement with this plan. I have updated Dr. Aundra Dubin (CHF team) regarding our discussion. I have consulted case manager to get home hospice referrals.    Dispo: Anticipated discharge in approximately 3-4 days. Dellia Nims, MD 10/26/2015, 7:58 AM Pager: [phone]

## 2015-10-26 NOTE — Progress Notes (Signed)
Patient ID: [name], female   DOB: Oct 31, 1950, 65 y.o.   MRN: [id number]    Advanced Heart Failure Rounding Note   Subjective:    Admitted 8/11 for recurrent RHF and AKI. Started on milrinone 0.25.   Remains lethargic but says she feels a little better. Denies dyspnea. Creatinine lower.  CVP 12 cm, co-ox 71%. She is on IV Lasix with reasonable diuresis.     Objective:   Weight Range:  Vital Signs:   Temp:  [97.7 F (36.5 C)-99.1 F (37.3 C)] 97.7 F (36.5 C) (08/13 1127) Pulse Rate:  [61-85] 77 (08/13 0423) Resp:  [17-20] 17 (08/13 1127) BP: (92-106)/(49-59) 100/49 (08/13 0423) SpO2:  [84 %-98 %] 97 % (08/13 0423) Weight:  [115 lb 3.2 oz (52.3 kg)-145 lb 3.2 oz (65.9 kg)] 145 lb 3.2 oz (65.9 kg) (08/13 0423) Last BM Date: 10/25/15  Weight change: Filed Weights   10/25/15 0406 10/25/15 2330 10/26/15 0423  Weight: 145 lb 12.8 oz (66.1 kg) 115 lb 3.2 oz (52.3 kg) 145 lb 3.2 oz (65.9 kg)    Intake/Output:   Intake/Output Summary (Last 24 hours) at 10/26/15 1157 Last data filed at 10/26/15 0926  Gross per 24 hour  Intake            652.8 ml  Output             2440 ml  Net          -1787.2 ml     Physical Exam  General: Lethargic. Arouses to conversation. NAD Psych: Normal affect. Neuro: Answer question appropriately and then goes back to sleep. Awake by arousal. Moves all extremities spontaneously. HEENT: Normal                     Neck: Supple without bruits. JVP 14 cm with prominent CV waves. Lungs:  Resp regular and unlabored, diminished breath sound. Heart: RRR + s3,  RV lift s4, 2/6 HSM LLSB.  Abdomen: Soft, non-tender, + distended, BS + x 4.  Extremities: No clubbing, cyanosis or 2+ edema. DP/PT/Radials 2+ and equal bilaterally. + mild asterixis  Telemetry: NSR   Labs: Basic Metabolic Panel:  Recent Labs Lab 10/24/15 1210 10/24/15 1630 10/25/15 0518 10/25/15 1700 10/26/15 0530  NA 133*  --  134* 134* 135  K 4.0  --  3.1* 3.2* 3.6  CL 98*   --  98* 99* 99*  CO2 19*  --  21* 23 23  GLUCOSE 78  --  104* 113* 89  BUN 106*  --  100* 94* 86*  CREATININE 3.45*  --  2.90* 2.49* 2.25*  CALCIUM 8.5*  --  8.3* 8.5* 8.6*  MG  --  1.4* 1.5* 1.9  --     Liver Function Tests:  Recent Labs Lab 10/25/15 0518  AST 24  ALT 10*  ALKPHOS 109  BILITOT 1.1  PROT 7.0  ALBUMIN 2.4*   No results for input(s): LIPASE, AMYLASE in the last 168 hours. No results for input(s): AMMONIA in the last 168 hours.  CBC:  Recent Labs Lab 10/24/15 1210 10/25/15 0518 10/26/15 0530  WBC 8.3 8.3 9.1  NEUTROABS  --  6.6  --   HGB 8.8* 8.3* 8.0*  HCT 26.5* 25.3* 24.0*  MCV 75.9* 74.0* 75.5*  PLT 321 352 304    Cardiac Enzymes:  Recent Labs Lab 10/24/15 1630  TROPONINI <0.03    BNP: BNP (last 3 results)  Recent Labs  07/29/15 1304 10/03/15  1435 10/24/15 1629  BNP 589.8* 637.4* 796.9*    ProBNP (last 3 results) No results for input(s): PROBNP in the last 8760 hours.    Other results:  Imaging: Dg Chest 2 View  Result Date: 10/24/2015 CLINICAL DATA:  65 year old female with a history of swelling and shortness of breath EXAM: CHEST  2 VIEW COMPARISON:  10/10/2015, 08/26/2015 FINDINGS: Cardiomediastinal silhouette persistently enlarged, similar prior. Calcifications of the aortic arch. Central vascular congestion. Interlobular septal thickening with bilateral mixed interstitial and airspace opacities extending from the hila peripherally. Thickening of the minor fissure. No pneumothorax. IMPRESSION: Cardiomegaly and evidence of congestive heart failure with developing right-sided pleural fluid. Aortic atherosclerosis Signed, Dulcy Fanny. Earleen Newport, DO [facility] Radiology Electronically Signed   By: Corrie Mckusick D.O.   On: 10/24/2015 14:05   Dg Chest Port 1 View  Result Date: 10/25/2015 CLINICAL DATA:  Status post PICC placement EXAM: PORTABLE CHEST 1 VIEW COMPARISON:  10/24/2015  FINDINGS: Right PICC line tip projects at the caval atrial junction. Hazy airspace opacity, right lung greater than left, similar to the prior study, likely asymmetric edema. Cardiac silhouette is mildly enlarged. No definite pleural effusion.  No pneumothorax. IMPRESSION: 1. Right PICC is well positioned with its tip at the caval atrial junction. 2. No other change from the prior study. Persistent right greater than left airspace lung opacities likely asymmetric pulmonary edema from congestive heart failure. Electronically Signed   By: Lajean Manes M.D.   On: 10/25/2015 13:43     Medications:     Scheduled Medications: . ambrisentan  10 mg Oral Daily  . enoxaparin (LOVENOX) injection  30 mg Subcutaneous QHS  . FLUoxetine  20 mg Oral q morning - 10a  . furosemide  80 mg Intravenous BID  . levothyroxine  25 mcg Oral QAC breakfast  . pantoprazole  40 mg Oral Daily  . potassium chloride  40 mEq Oral BID  . Riociguat  2.5 mg Oral Q8H  . Selexipag  1,600 mcg Oral BID    Infusions: . milrinone 0.25 mcg/kg/min (10/25/15 1656)    PRN Medications:     Assessment/Plan:   1. 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 Echo 7/17 EF 55-60% with moderate dilated and mildly dysfunctional RV, severe TR, PASP 91 mmHg. Recently admitted with recurrent right heart failure and diuresed, has been readmitted this time after < 2 wks with recurrent RV failure. CVP 12 today.  - Milrinone begun for RV support, co-ox 71%.     - Continue Lasix 80 mg IV bid.  - I think that she has reached end-stage RV failure.  Talked with family and patient today about hospice, they are interested.  She is DNR/DNI.  I will consult the palliative care service.   2. PAH with cor pulmonale: Severe PAH. Had last [place] in 1/16. Pulmonary pressure remained elevated by last echo. - Continue ambrisentan 10 mg daily.  - Continue selexipag 1600 mcg bid.  - Continue riociguat 2.5 tid.   3. CKD stage III:  Admitted with AKI, suspect cardiorenal.  Creatinine trending down on milrinone. 4. 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 AVMs noted 01/2015. AVMs clipped in 4/17. She is no longer anticoagulated.  No overt bleeding.  5. Pericardial effusion: Resolved after pericardiocentesis, not seen on echo 12/2014. Repeat echo 7/17 with trivial effusion.  6. Atrial fibrillation: Paroxysmal, she is in NSR.  - Continue amiodarone.  - No anticoagulation with h/o recurrent  GI bleeding.  7. Hypothyroidism: May be related to amiodarone.  - On Levoxyl 25 mcg daily.  8. Severe tricuspid regurgitation  Length of Stay: 2 [name] MD 10/26/2015, 11:57 AM  Advanced Heart Failure Team Pager [phone] (M-F; 7a - 4p)  Please contact [facility] for night-coverage after hours (4p -7a ) and weekends on [email or web address]

## 2015-10-26 NOTE — Consult Note (Signed)
Consultation Note Date: 10/26/2015   Patient Name: [name]  DOB: [birth year]  MRN: [id number]  Age / Sex: 65 y.o., female  PCP: Sid Falcon, MD Referring Physician: Bartholomew Crews, MD  Reason for Consultation: Terminal Care  HPI/Patient Profile: 65 y.o. female   admitted on 10/24/2015    Clinical Assessment and Goals of Care:  [name] is a 65 year old woman with scleroderma and severe secondary pulmonary hypertension. In addition she has type 2 diabetes, hyperlipidemia, hypertension, acid reflux, gastroparesis, diastolic heart failure  Patient has acute on chronic end-stage right-sided heart failure with shock. She has history of diastolic heart failure secondary to pulmonary hypertension secondary to steroid [place]. Patient presented to the hospital with increasing lethargy and shortness of breath. She was recently hospitalized for the same reasons. Currently she is on milrinone and diuretics as well. She is being followed by pulmonary and cardiac services. Echocardiogram showed grade 2 diastolic dysfunction, severe tricuspid regurgitation, high pulmonary arterial pressures. Palliative care consultation has been requested for ensuring arrangements for home with home hospice after initial family meeting discussions undertook between the patient and her family and the primary service.  Patient is a older than stated age appearing lady resting in bed. She is acutely dyspneic. Family-several sisters and a cousin are present at the bedside. I introduced myself and palliative care as follows: Palliative medicine is specialized medical care for people living with serious illness. It focuses on providing relief from the symptoms and stress of a serious illness. The goal is to improve quality of life for both the patient and the family.  Discussed about aggressive symptom management. Patient received 1 dose of  1 mg IV morphine a few hours ago. She is visibly dyspneic. We will begin morphine 2 mg IV every 3 hours on an as-needed basis. Continue milrinone, diuretics for now. Follow-up on 8-14. Thank you for the consult. NEXT OF KIN  has 2 children, sisters.   SUMMARY OF RECOMMENDATIONS    DNR DNI Wants to go home with hospice Aggressive symptom management with IV Morphine PRN for dyspnea, generalized discomfort/pain.   Code Status/Advance Care Planning:  DNR    Symptom Management:    as above   Palliative Prophylaxis:   Bowel Regimen     Psycho-social/Spiritual:   Desire for further Chaplaincy support:no  Additional Recommendations: Education on Hospice  Prognosis:   < 2 weeks  Discharge Planning: Home with Hospice      Primary Diagnoses: Present on Admission: . Anemia, iron deficiency . AVM (arteriovenous malformation) of stomach, acquired . Essential hypertension   I have reviewed the medical record, interviewed the patient and family, and examined the patient. The following aspects are pertinent.  Past Medical History:  Diagnosis Date  . Anemia    a.  hx of GI bleed and duodenal AVMs  . Angiodysplasia of colon with hemorrhage   . Arthritis   . Chronic diastolic CHF (congestive heart failure) ([place])    a. Echo 12/23/14 withEF 60-65%, moderately dilated RV, PASP  72, no pericardial effusion.  . Chronic kidney disease   . Dysrhythmia   . Gastroparesis   . GERD (gastroesophageal reflux disease)   . Gout   . Hypothyroidism    a. may be due to amiodarone use. started on synthroid on 12/2014 admission   . Obesity   . PAF (paroxysmal atrial fibrillation) (HCC)    a. not a long term AC candidate due to GI bleeds  . Peripheral neuropathy ([place])   . Scleroderma ([place])   . Secondary pulmonary hypertension ([place])    a. 2/2 scleroderma  . Sickle cell trait ([place])   . Systemic sclerosis ([place])   . Transfusion history    last 06-21-15  . Trichomonas   . Type II diabetes  mellitus (HCC)    a. diet control   . Unspecified essential hypertension    Social History   Social History  . Marital status: Legally Separated    Spouse name: N/A  . Number of children: 2  . Years of education: 11   Occupational History  . Umemployed Disability  . Disability     Social History Main Topics  . Smoking status: Never Smoker  . Smokeless tobacco: Never Used  . Alcohol use No  . Drug use: No  . Sexual activity: No   Other Topics Concern  . None   Social History Narrative    FAMILY HISTORY:  Significant for coronary artery disease and diabetes   Patient lives at home with granddaughter.    Patient has 2 children.    Patient has 11 years of education.    Patient is on disability.    Patient is right handed.    Patient is separated.     Family History  Problem Relation Age of Onset  . Heart disease Mother   . Diabetes Mother   . Diabetes Sister    Scheduled Meds: . ambrisentan  10 mg Oral Daily  . amiodarone  200 mg Oral Daily  . enoxaparin (LOVENOX) injection  30 mg Subcutaneous QHS  . FLUoxetine  20 mg Oral q morning - 10a  . furosemide  80 mg Intravenous BID  . levothyroxine  25 mcg Oral QAC breakfast  .  morphine injection  1 mg Intravenous Once  . pantoprazole  40 mg Oral Daily  . potassium chloride  40 mEq Oral BID  . Riociguat  2.5 mg Oral Q8H  . Selexipag  1,600 mcg Oral BID   Continuous Infusions: . milrinone 0.25 mcg/kg/min (10/25/15 1656)   PRN Meds:.morphine injection Medications Prior to Admission:  Prior to Admission medications   Medication Sig Start Date End Date Taking? Authorizing Provider  ambrisentan (LETAIRIS) 10 MG tablet Take 1 tablet (10 mg total) by mouth daily. 04/11/15  Yes Larey Dresser, MD  Amino Acids-Protein Hydrolys (FEEDING SUPPLEMENT, PRO-STAT SUGAR FREE 64,) LIQD Take 30 mLs by mouth 3 (three) times daily between meals. 09/24/15  Yes Asencion Partridge, MD  amiodarone (PACERONE) 200 MG tablet Take 1 tablet (200 mg  total) by mouth daily. 08/20/15  Yes Larey Dresser, MD  esomeprazole (NEXIUM) 40 MG capsule Take 40 mg by mouth daily at 12 noon.    Yes Historical Provider, MD  fenofibrate 54 MG tablet Take 1 tablet (54 mg total) by mouth daily. 04/07/15  Yes Sid Falcon, MD  FLUoxetine (PROZAC) 20 MG capsule Take 1 capsule (20 mg total) by mouth every morning. 06/18/15  Yes Sid Falcon, MD  HYDROcodone-acetaminophen [facility]) 10-325 MG tablet  Take 1 tablet by mouth every 6 (six) hours. pain 06/17/15  Yes Historical Provider, MD  hydrOXYzine (ATARAX/VISTARIL) 10 MG tablet Take 1 tablet (10 mg total) by mouth 2 (two) times daily as needed for itching. 09/17/15  Yes Sid Falcon, MD  levothyroxine (SYNTHROID, LEVOTHROID) 25 MCG tablet Take 1 tablet (25 mcg total) by mouth daily before breakfast. 12/28/14  Yes Eileen Stanford, PA-C  LYRICA 150 MG capsule take 1 capsule by mouth three times a day 10/23/15  Yes Dennie Bible, NP  metolazone (ZAROXOLYN) 2.5 MG tablet Take 1 tablet (2.5 mg total) by mouth 2 (two) times a week. Tuesdays and Fridays 10/16/15  Yes Shirley Friar, PA-C  nortriptyline (PAMELOR) 10 MG capsule Take 2 capsules (20 mg total) by mouth at bedtime. 03/19/15  Yes Dennie Bible, NP  potassium chloride SA (K-DUR,KLOR-CON) 20 MEQ tablet Take 1 tablet (20 mEq total) by mouth daily. Take an additional 20 mEq when taking Metolazone 10/15/15  Yes Satira Mccallum Tillery, PA-C  Riociguat (ADEMPAS) 2.5 MG TABS Take by mouth.   Yes Historical Provider, MD  Selexipag (UPTRAVI) 1600 MCG TABS Take 1,600 mcg by mouth 2 (two) times daily. 10/22/14  Yes Jolaine Artist, MD  torsemide (DEMADEX) 20 MG tablet Take 4 tablets (80 mg total) every morning and 3 tablets (60 mg total) every evening. 10/15/15  Yes Shirley Friar, PA-C   Allergies  Allergen Reactions  . Morphine And Related Other (See Comments)    Listed as an allergy, per [facility].  Pt received morphine 27m IV  dose on 10/26/15~ 1300  w/o adverse effect reported.    . Cephalexin Rash  . Ciprofloxacin Rash  . Codeine Other (See Comments)     GI upset  . Contrast Media [Iodinated Diagnostic Agents] Hives  . Iohexol Hives     Code: HIVES, Desc: pt breaks out in large hives. needs full premeds, Onset Date: [id number]   Review of Systems  Gastrointestinal: Negative.    Positive for generalized discomfort, shortness of breath.  Physical Exam Older than stated age appearing lady week Mild-moderate respiratory distress Using accessory muscles of respiration Shallow coarse breath sounds Abdomen soft distended S1-S2 Trace edema Awake alert oriented answers questions appropriately although in mild-moderate degree of dyspnea at the moment  Vital Signs: BP (!) 100/49 (BP Location: Right Arm)   Pulse 77   Temp 98.3 F (36.8 C) (Oral)   Resp 17   Ht _0  (1.575 m)   Wt 65.9 kg (145 lb 3.2 oz)   SpO2 97%   BMI 26.56 kg/m  Pain Assessment: No/denies pain   Pain Score: Asleep   SpO2: SpO2: 97 % O2 Device:SpO2: 97 % O2 Flow Rate: .O2 Flow Rate (L/min): 4 L/min  IO: Intake/output summary:  Intake/Output Summary (Last 24 hours) at 10/26/15 1735 Last data filed at 10/26/15 1548  Gross per 24 hour  Intake           1012.8 ml  Output             2500 ml  Net          -1487.2 ml    LBM: Last BM Date: 10/25/15 Baseline Weight: Weight: 66 kg (145 lb 6.4 oz) Most recent weight: Weight: 65.9 kg (145 lb 3.2 oz)     Palliative Assessment/Data:   Flowsheet Rows   Flowsheet Row Most Recent Value  Intake Tab  Referral Department  Hospitalist  Unit at Time of Referral  Cardiac/Telemetry Unit  Palliative Care Primary Diagnosis  Pulmonary  Palliative Care Type  New Palliative care  Reason for referral  End of Life Care Assistance  Clinical Assessment  Palliative Performance Scale Score  30%  Pain Max last 24 hours  7  Pain Min Last 24 hours  6  Dyspnea Max Last 24 Hours  8  Dyspnea Min Last 24 hours  7    Psychosocial & Spiritual Assessment  Palliative Care Outcomes  Patient/Family meeting held?  Yes  Who was at the meeting?  patient sisters   Palliative Care Outcomes  Clarified goals of care      Time In:   Time Out:   Time Total:   Greater than 50%  of this time was spent counseling and coordinating care related to the above assessment and plan.  Signed by: Loistine Chance, MD  [phone] Please contact Palliative Medicine Team phone at [phone] for questions and concerns.  For individual provider: See Shea Evans

## 2015-10-27 ENCOUNTER — Ambulatory Visit: Payer: Medicare Other | Admitting: Nurse Practitioner

## 2015-10-27 DIAGNOSIS — I5023 Acute on chronic systolic (congestive) heart failure: Secondary | ICD-10-CM

## 2015-10-27 DIAGNOSIS — N179 Acute kidney failure, unspecified: Secondary | ICD-10-CM

## 2015-10-27 DIAGNOSIS — M349 Systemic sclerosis, unspecified: Secondary | ICD-10-CM

## 2015-10-27 DIAGNOSIS — N189 Chronic kidney disease, unspecified: Secondary | ICD-10-CM

## 2015-10-27 DIAGNOSIS — Z9981 Dependence on supplemental oxygen: Secondary | ICD-10-CM

## 2015-10-27 DIAGNOSIS — Z66 Do not resuscitate: Secondary | ICD-10-CM

## 2015-10-27 LAB — BASIC METABOLIC PANEL
ANION GAP: 9 (ref 5–15)
BUN: 73 mg/dL — AB (ref 6–20)
CALCIUM: 8.8 mg/dL — AB (ref 8.9–10.3)
CO2: 24 mmol/L (ref 22–32)
Chloride: 104 mmol/L (ref 101–111)
Creatinine, Ser: 1.93 mg/dL — ABNORMAL HIGH (ref 0.44–1.00)
GFR calc Af Amer: 30 mL/min — ABNORMAL LOW (ref >60)
GFR, EST NON AFRICAN AMERICAN: 26 mL/min — AB (ref >60)
Glucose, Bld: 79 mg/dL (ref 65–99)
POTASSIUM: 4.4 mmol/L (ref 3.5–5.1)
Sodium: 137 mmol/L (ref 135–145)

## 2015-10-27 LAB — CBC
HEMATOCRIT: 25.9 % — AB (ref 36.0–46.0)
HEMOGLOBIN: 8.6 g/dL — AB (ref 12.0–15.0)
MCH: 25 pg — AB (ref 26.0–34.0)
MCHC: 33.2 g/dL (ref 30.0–36.0)
MCV: 75.3 fL — ABNORMAL LOW (ref 78.0–100.0)
Platelets: 325 10*3/uL (ref 150–400)
RBC: 3.44 MIL/uL — ABNORMAL LOW (ref 3.87–5.11)
RDW: 20.2 % — AB (ref 11.5–15.5)
WBC: 9.2 10*3/uL (ref 4.0–10.5)

## 2015-10-27 LAB — CARBOXYHEMOGLOBIN
Carboxyhemoglobin: 2 % — ABNORMAL HIGH (ref 0.5–1.5)
METHEMOGLOBIN: 1 % (ref 0.0–1.5)
O2 Saturation: 74.5 %
TOTAL HEMOGLOBIN: 8.9 g/dL — AB (ref 12.0–16.0)

## 2015-10-27 MED ORDER — METOLAZONE 5 MG PO TABS
2.5000 mg | ORAL_TABLET | Freq: Once | ORAL | Status: AC
  Administered 2015-10-27: 2.5 mg via ORAL
  Filled 2015-10-27: qty 1

## 2015-10-27 MED ORDER — POTASSIUM CHLORIDE CRYS ER 20 MEQ PO TBCR
40.0000 meq | EXTENDED_RELEASE_TABLET | Freq: Every day | ORAL | Status: DC
  Filled 2015-10-27: qty 2

## 2015-10-27 NOTE — Progress Notes (Signed)
   Subjective: Patient continues to be lethargic but responsive and oriented. She still experiences shortness of breath that has been unchanged, but is somewhat relieved with morphine. She maintains wish to go home with hospice.   Objective:  Vital signs in last 24 hours: Vitals:   10/27/15 0427 10/27/15 0500 10/27/15 0703 10/27/15 0801  BP: (!) 110/57  (!) 113/55   Pulse: 85   86  Resp: 18   15  Temp: 98.7 F (37.1 C)   98 F (36.7 C)  TempSrc: Oral   Oral  SpO2: 95% 96% 93% 92%  Weight: 55.7 kg (122 lb 14.4 oz)     Height:       Constitutional: more responsive, NAD, slow to respond CV: RRR, systolic murmur on LLSB, no rubs or gallops, no carotid bruits, +JVD Resp: coarse crackles appreciated bilaterally R>L, somewhat labored breathing but patient is able to converse Abd: soft, distended, +BS, nontender Ext: 1+ lower extremity edema bilaterally, no clubbing; 2+ pulses throughout  Assessment/Plan:  Active Problems:   Essential hypertension   Anemia, iron deficiency   AVM (arteriovenous malformation) of stomach, acquired   Acute on chronic heart failure (HCC)   Cardiorenal syndrome with renal failure   Circulatory collapse (HCC)   Acute on chronic systolic congestive heart failure (HCC)   Encounter for palliative care   Goals of care, counseling/discussion  Acute on chronic diastolic heart failure: Patient has history of diastolic heart failure 2/2 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 Patient presents with increasing lethargy and shortness of breath since discharge on 8/2 for same reason, when she was treated with milrinone and discharged on torsemide 80/60 BID and metazolone 2.82m twice weekly. Her family maintains compliance with medicines. Patient lives with granddaughter, administers own meds, [facility]. Previous admission ECHO 10/10/2015 shows worsening pulmonary edema, grade 2 diastolic heart failure, and severe TR.  Pulmonary arterial hypertension last evaluated with [facility] in 1/16 and was severe. BMP 798.9, lactate negative, troponins negative x2, EKG shows 1st degree AV block, VBG respiratory alkalosis. --milrinone dosed by weight, selexipag 16076m BID, Ambrisentan 1052maily, Riociguat 2.5mg44mhrs --daily weights --diurese with lasix 80mg74m BID --PICC for co-ox and CVP, CVP 12 today, CO-ox 74.5%  PAF: Patient has history of PAF managed on amiodarone. Currently in sinus rhythm --amiodarone 200mg 58my  Acute on Chronic CKD, stage III: Patient has history of CKD stage 3, and presented on admission with Cr of 3.45 (with baseline of 1.5-2), BUN 106, and GFR 15. AKI on account of worsening heart failure. Cr to 1.93 this morning improved with milrinone and Lasix.  --Lasix 80mg b86mHypoK/hypoMg: Patient had hypomagnesemia on admission and worsening hypokalemia upon diuresing. Her Mg and K were replaced.  --Currently Mg 1.9, and K 4.4 --monitor and replace as needed.  Goals of Care: At family meeting on 10/26/15, both patient and family agreed (including POMA) that her code status is DNR/DNI and that her wish is to go home on hospice. Palliative care saw her as well, and reached the same conclusion in conversation with the patient. To have home hospice, she still needs round-the-clock support from family when hospice nurse is not there. We will discuss this further with the family today to ensure adequate support is available, other wise we may have to pursue inpatient hospice.  Dispo: Anticipated discharge in approximately 5-6 day(s).   Mcarthur Ivins Alphonzo Grieve14/2017, 11:21 AM Pager: [phone]

## 2015-10-27 NOTE — Consult Note (Signed)
[facility] Met with the patient at bedside to discuss hospice referral. Referral for hospice services in the home with Milrinone IV received from [employer], RN, CM. Present for discussion: Sisters: Ashok Cordia, Nita Sickle, Theone Murdoch, and Sister in Sports coach, Camie Patience. The patient's son was not present.The family has a good understanding of the terminal nature of the patient's disease  The patient is extremely dyspnic (2-3 words) and essentially unable to participate in actual discussion. Her family stated that they would like to honor her desire to return home, but that they cannot provide care 24/7 Advised the patient and family that [place], on occasion provides Milrinone in the home setting (case-by-case review), but that we do not accept Milrinone at [facility]. Hattie stated that she will discuss with the patient's son. Contact information for this liaison given to [facility]. This patient has been served by [facility]'s Palliative Care/[facility] Program for the past 2 months. Thank you for this referral. Will continue to follow until a disposition is known.  Yetta Glassman RN [place] [phone], cell

## 2015-10-27 NOTE — Progress Notes (Signed)
Patient ID: [name], female   DOB: May 16, 1950, 65 y.o.   MRN: [id number]    Advanced Heart Failure Rounding Note   Subjective:    Admitted 8/11 for recurrent RHF and AKI. Started on milrinone 0.25.   More alert today. Remains quite dyspneic. Stopping every 2-3 words.   Bed weights all over the place. Out 900 cc yesterday. Renal function trending down. Coox 74.5%.  Objective:   Weight Range:  Vital Signs:   Temp:  [97.7 F (36.5 C)-98.9 F (37.2 C)] 98 F (36.7 C) (08/14 0801) Pulse Rate:  [83-90] 86 (08/14 0801) Resp:  [15-20] 15 (08/14 0801) BP: (104-113)/(48-59) 113/55 (08/14 0703) SpO2:  [90 %-96 %] 92 % (08/14 0801) Weight:  [122 lb 14.4 oz (55.7 kg)] 122 lb 14.4 oz (55.7 kg) (08/14 0427) Last BM Date: 10/26/15  Weight change: Filed Weights   10/25/15 2330 10/26/15 0423 10/27/15 0427  Weight: 115 lb 3.2 oz (52.3 kg) 145 lb 3.2 oz (65.9 kg) 122 lb 14.4 oz (55.7 kg)    Intake/Output:   Intake/Output Summary (Last 24 hours) at 10/27/15 1042 Last data filed at 10/27/15 0900  Gross per 24 hour  Intake              648 ml  Output             1775 ml  Net            -1127 ml     Physical Exam CVP 11-12 General: Lethargic but easily arousable to conversation today. to conversation. Dyspneic. Psych: Normal affect. Neuro: Easily arouses today.  Moves all extremities spontaneously. HEENT: Normal                     Neck: Supple without bruits. JVP 11-12 cm with prominent CV waves. Lungs: Dyspneic at rest and worse with speaking.  Heart: RRR + s3,  RV lift s4, 2/6 HSM LLSB.  Abdomen: Soft, NT, moderate distention, no HSM. No bruits or masses. +BS  Extremities: No clubbing, cyanosis or 2+ edema. DP/PT/Radials 2+ and equal bilaterally. + mild asterixis  Telemetry: Reviewed personally, NSR  Labs: Basic Metabolic Panel:  Recent Labs Lab 10/24/15 1210 10/24/15 1630 10/25/15 0518 10/25/15 1700 10/26/15 0530 10/27/15 0458  NA 133*  --  134* 134* 135 137    K 4.0  --  3.1* 3.2* 3.6 4.4  CL 98*  --  98* 99* 99* 104  CO2 19*  --  21* _0 GLUCOSE 78  --  104* 113* 89 79  BUN 106*  --  100* 94* 86* 73*  CREATININE 3.45*  --  2.90* 2.49* 2.25* 1.93*  CALCIUM 8.5*  --  8.3* 8.5* 8.6* 8.8*  MG  --  1.4* 1.5* 1.9  --   --     Liver Function Tests:  Recent Labs Lab 10/25/15 0518  AST 24  ALT 10*  ALKPHOS 109  BILITOT 1.1  PROT 7.0  ALBUMIN 2.4*   No results for input(s): LIPASE, AMYLASE in the last 168 hours. No results for input(s): AMMONIA in the last 168 hours.  CBC:  Recent Labs Lab 10/24/15 1210 10/25/15 0518 10/26/15 0530 10/27/15 0458  WBC 8.3 8.3 9.1 9.2  NEUTROABS  --  6.6  --   --   HGB 8.8* 8.3* 8.0* 8.6*  HCT 26.5* 25.3* 24.0* 25.9*  MCV 75.9* 74.0* 75.5* 75.3*  PLT 321 352 304 325    Cardiac Enzymes:  Recent Labs Lab 10/24/15 1630  TROPONINI <0.03    BNP: BNP (last 3 results)  Recent Labs  07/29/15 1304 10/03/15 1435 10/24/15 1629  BNP 589.8* 637.4* 796.9*    ProBNP (last 3 results) No results for input(s): PROBNP in the last 8760 hours.    Other results:  Imaging: Dg Chest Port 1 View  Result Date: 10/25/2015 CLINICAL DATA:  Status post PICC placement EXAM: PORTABLE CHEST 1 VIEW COMPARISON:  10/24/2015 FINDINGS: Right PICC line tip projects at the caval atrial junction. Hazy airspace opacity, right lung greater than left, similar to the prior study, likely asymmetric edema. Cardiac silhouette is mildly enlarged. No definite pleural effusion.  No pneumothorax. IMPRESSION: 1. Right PICC is well positioned with its tip at the caval atrial junction. 2. No other change from the prior study. Persistent right greater than left airspace lung opacities likely asymmetric pulmonary edema from congestive heart failure. Electronically Signed   By: Lajean Manes M.D.   On: 10/25/2015 13:43     Medications:     Scheduled Medications: . ambrisentan  10 mg Oral Daily  . amiodarone  200 mg Oral  Daily  . enoxaparin (LOVENOX) injection  30 mg Subcutaneous QHS  . FLUoxetine  20 mg Oral q morning - 10a  . furosemide  80 mg Intravenous BID  . levothyroxine  25 mcg Oral QAC breakfast  . pantoprazole  40 mg Oral Daily  . potassium chloride  40 mEq Oral BID  . Riociguat  2.5 mg Oral Q8H  . Selexipag  1,600 mcg Oral BID    Infusions: . milrinone 0.25 mcg/kg/min (10/27/15 0500)    PRN Medications:     Assessment/Plan:  [name] is a 65 y.o. female with PAH and RHF failure, CKD stage III, PAF, and severe TR. Recently admitted with recurrent right heart failure and diuresed, has been readmitted this time after < 2 wks with recurrent RV failure.   1. 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 Echo 7/17 EF 55-60% with moderate dilated and mildly dysfunctional RV, severe TR, PASP 91 mmHg.  - Milrinone begun for RV support, co-ox 76%.    CVP 11-12 - Continue Lasix 80 mg IV bid for now. BUN/Creatinine improving - She has reached end-stage RV failure. Family and patient have discussed Hospice with Palliative care. She is DNR/DNI. Discussions ongoing.    2. PAH with cor pulmonale: Severe PAH. Had last [place] in 1/16. Pulmonary pressure remained elevated by last echo. - Continue ambrisentan 10 mg daily.  - Continue selexipag 1600 mcg bid.  - Continue riociguat 2.5 tid.   3. CKD stage III: Admitted with AKI, suspect cardiorenal.  - Creatinine improving.  4. 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 AVMs noted 01/2015. AVMs clipped in 4/17. She is no longer anticoagulated.  No overt bleeding.  5. Pericardial effusion: Resolved after pericardiocentesis, not seen on echo 12/2014. Repeat echo 7/17 with trivial effusion.  6. Atrial fibrillation: Paroxysmal, she is in NSR.  - Continue amiodarone.  - No anticoagulation with h/o recurrent GI bleeding.  7. Hypothyroidism: May be related to amiodarone.  - On Levoxyl 25 mcg daily.  8. Severe tricuspid  regurgitation  On going discussions with palliative care. Think Hospice best option for patient. Will try to wean off milrinone once diureses, but suspect she will decompensate fairly quickly at that point.   Length of Stay: 3 Annamaria Helling 10/27/2015, 10:42 AM  Advanced Heart Failure Team  Pager [phone] (M-F; [place])  Please contact [facility] for night-coverage after hours (4p -7a ) and weekends on [email or web address]  Patient seen with PA, agree with the above note.  She remains dyspneic.  CVP about 12, volume overloaded still by exam.  Will give a dose of metolazone with her pm Lasix.    End stage RV failure.  Agree with hospice care.  I do not think that she will be able to go home in her current state.  Suspect she will need [employer].  Await further recommendations from palliative care service.  Discussed with family.  Will stop milrinone when we have her as diuresed as we can get her and she is ready for [employer].   [name] 10/27/2015 3:08 PM

## 2015-10-27 NOTE — Discharge Summary (Signed)
Name: [name] MRN: [id number] DOB: 01/25/1951 65 y.o. PCP: Sid Falcon, MD  Date of Admission: 10/24/2015  1:29 PM Date of Discharge: 10/28/2015 Attending Physician: Axel Filler, MD  Discharge Diagnosis: 1.Acute on chronic diastolic heart failure Active Problems:   Essential hypertension   Anemia, iron deficiency   AVM (arteriovenous malformation) of stomach, acquired   Acute on chronic heart failure (HCC)   Cardiorenal syndrome with renal failure   Circulatory collapse (HCC)   Acute on chronic systolic congestive heart failure ([place])   Encounter for palliative care   Goals of care, counseling/discussion   Acute on chronic kidney failure ([place])   Discharge Medications:   Medication List    STOP taking these medications   ADEMPAS 2.5 MG Tabs Generic drug:  Riociguat   ambrisentan 10 MG tablet Commonly known as:  LETAIRIS   amiodarone 200 MG tablet Commonly known as:  PACERONE   esomeprazole 40 MG capsule Commonly known as:  NEXIUM   fenofibrate 54 MG tablet   FLUoxetine 20 MG capsule Commonly known as:  PROZAC   HYDROcodone-acetaminophen 10-325 MG tablet Commonly known as:  NORCO   hydrOXYzine 10 MG tablet Commonly known as:  ATARAX/VISTARIL   levothyroxine 25 MCG tablet Commonly known as:  SYNTHROID, LEVOTHROID   LYRICA 150 MG capsule Generic drug:  pregabalin   metolazone 2.5 MG tablet Commonly known as:  ZAROXOLYN   nortriptyline 10 MG capsule Commonly known as:  PAMELOR   potassium chloride SA 20 MEQ tablet Commonly known as:  K-DUR,KLOR-CON   Selexipag 1600 MCG Tabs Commonly known as:  UPTRAVI   torsemide 20 MG tablet Commonly known as:  DEMADEX     TAKE these medications   feeding supplement (PRO-STAT SUGAR FREE 64) Liqd Take 30 mLs by mouth 3 (three) times daily between meals.   furosemide 10 MG/ML injection Commonly known as:  LASIX Inject 8 mLs (80 mg total) into the vein 2 (two) times daily.   morphine 2 MG/ML  injection Inject 1 mL (2 mg total) into the vein every 3 (three) hours as needed (dyspnea).       Disposition and follow-up:   [name] was discharged from [facility] in Serious condition.  At the hospital follow up visit please address:  1.  --comfort measures for end-stage right-sided heart failure  ------Morphine 70m q3hrs PRN  ------lasix 8460mBID  2.  Labs / imaging needed at time of follow-up: None  3.  Pending labs/ test needing follow-up: None  Follow-up Appointments: None  Hospital Course by problem list: Active Problems:   Essential hypertension   Anemia, iron deficiency   AVM (arteriovenous malformation) of stomach, acquired   Acute on chronic heart failure (HCC)   Cardiorenal syndrome with renal failure   Circulatory collapse (HCC)   Acute on chronic systolic congestive heart failure ([place]  Encounter for palliative care   Goals of care, counseling/discussion   Acute on chronic kidney failure ([place]  Acute on chronic diastolic heart failure: Patient has history of diastolic heart failure 2/2 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 Patient presents with increasing lethargy and shortness of breath since discharge on 8/2 for same reason, when she was treated with milrinone and discharged on torsemide 80/60 BID and metazolone 2.60m64mwice weekly. Her family maintains compliance with medicines. Patient lives with granddaughter, administers own meds, and has nurse from [facility] once a week to take vitals. Previous admission ECHO 10/10/2015 shows worsening pulmonary  edema, grade 2 diastolic heart failure, and severe TR. Pulmonary arterial hypertension last evaluated with [facility] in 1/16 and was severe. BMP 798.9, lactate negative, troponins negative x2, EKG shows 1st degree AV block. This has likely progressed to end-stage diastolic heart failure with minimal response to diuretics and milrinone. Upon discussion with the heart team and patient's  family, it was decided that comfort measures should be undertaken, without aggressive treatment of underlying disease as patient appears to be entering final stages of dying. She will be supported with diuresis for comfort and morphine PRN.  Goals of Care: At family meeting on 10/26/15, both patient and family agreed (including POMA) that her code status is DNR/DNI and that her wish is to go home on hospice. Palliative care saw her as well, and reached the same conclusion in conversation with the patient. To have home hospice, she still needs round-the-clock support from family when hospice nurse is not there. We discussed this with the family, including POMA, who are not able to provide the support for home hospice. On evaluating patient today, it appears that patient is entering final stages of dying; patient's POMA and son agreed that patient should be kept comfortable and we would discontinue all meds except diuretics and morphine. Family wishes to pursue inpatient hospice at [facility].  YLT:EIHDTPN has history of PAF managed on amiodarone. Currently in sinus rhythm. She was continued on her home dose of amiodarone 235m daily while inpatient but this was discontinued once comfort measures were undertaken.  Acute on Chronic CKD, stage III: Patient has history of CKD stage 3, and presented on admission with Cr of 3.45 (with baseline of 1.5-2), BUN 106, and GFR 15. AKI on account of worsening heart failure. Cr has been improving with addition of milrinone and lasix. Upon discussion with family and cardiologists, milrinone was discontinued as it was not providing much benefit any longer, and family, including POMA wished to pursue hospice with comfort measures.   HypoK/hypoMg: Patient had hypomagnesemia on admission and worsening hypokalemia upon diuresing. Her Mg and K were replaced.  Discharge Vitals:   BP (!) 107/59   Pulse 80   Temp 98.2 F (36.8 C) (Oral)   Resp 17   Ht _0  (1.575 m)   Wt  61.3 kg (135 lb 3.2 oz)   SpO2 95%   BMI 24.73 kg/m   Pertinent Labs, Studies, and Procedures:  CBC Latest Ref Rng & Units 10/27/2015 10/26/2015 10/25/2015  WBC 4.0 - 10.5 K/uL 9.2 9.1 8.3  Hemoglobin 12.0 - 15.0 g/dL 8.6(L) 8.0(L) 8.3(L)  Hematocrit 36.0 - 46.0 % 25.9(L) 24.0(L) 25.3(L)  Platelets 150 - 400 K/uL 325 304 352   BMP Latest Ref Rng & Units 10/28/2015 10/27/2015 10/26/2015  Glucose 65 - 99 mg/dL 87 79 89  BUN 6 - 20 mg/dL 69(H) 73(H) 86(H)  Creatinine 0.44 - 1.00 mg/dL 1.92(H) 1.93(H) 2.25(H)  Sodium 135 - 145 mmol/L 138 137 135  Potassium 3.5 - 5.1 mmol/L 4.0 4.4 3.6  Chloride 101 - 111 mmol/L 103 104 99(L)  CO2 22 - 32 mmol/L _1 Calcium 8.9 - 10.3 mg/dL 8.7(L) 8.8(L) 8.6(L)   EKG 10/25/2015: NSR with 1st degree AV block   Discharge Instructions: --comfort measures with lasix 821mBID and morphine 21m74m3hrs PRN.  Signed: GorAlphonzo GrieveD 10/28/2015, 2:03 PM   Pager: [phone]

## 2015-10-27 NOTE — Care Management Note (Addendum)
Case Management Note  Patient Details  Name: [name] MRN: [id number] Date of Birth: [birth year]  Subjective/Objective:                 Palliative rec home hospice, patient will DC with Milrinone Gtt. Sisters [name] [phone] and [name] [phone] at bedside. They would like to use [facility] for home hospice. They state they will be in room all day to speak with hospice liason.   Action/Plan:  Referral called in to [place], they are evaluating use of  milrinone gtt.  Expected Discharge Date:                  Expected Discharge Plan:  Home w Hospice Care  In-House Referral:     Discharge planning Services  CM Consult  Post Acute Care Choice:    Choice offered to:  Sibling  DME Arranged:  N/A DME Agency:  NA  HH Arranged:    [place] Agency:  [facility]  Status of Service:  In process, will continue to follow  If discussed at Long Length of Stay Meetings, dates discussed:    Additional Comments:  Carles Collet, RN 10/27/2015, 11:58 AM

## 2015-10-28 ENCOUNTER — Encounter: Payer: Self-pay | Admitting: Nurse Practitioner

## 2015-10-28 LAB — CARBOXYHEMOGLOBIN
Carboxyhemoglobin: 2.3 % — ABNORMAL HIGH (ref 0.5–1.5)
Methemoglobin: 1.2 % (ref 0.0–1.5)
O2 Saturation: 90.5 %
TOTAL HEMOGLOBIN: 8.5 g/dL — AB (ref 12.0–16.0)

## 2015-10-28 LAB — BASIC METABOLIC PANEL
Anion gap: 11 (ref 5–15)
BUN: 69 mg/dL — AB (ref 6–20)
CALCIUM: 8.7 mg/dL — AB (ref 8.9–10.3)
CO2: 24 mmol/L (ref 22–32)
CREATININE: 1.92 mg/dL — AB (ref 0.44–1.00)
Chloride: 103 mmol/L (ref 101–111)
GFR calc Af Amer: 31 mL/min — ABNORMAL LOW (ref >60)
GFR, EST NON AFRICAN AMERICAN: 26 mL/min — AB (ref >60)
GLUCOSE: 87 mg/dL (ref 65–99)
Potassium: 4 mmol/L (ref 3.5–5.1)
SODIUM: 138 mmol/L (ref 135–145)

## 2015-10-28 MED ORDER — ACETAMINOPHEN 500 MG PO TABS
500.0000 mg | ORAL_TABLET | Freq: Once | ORAL | Status: AC
  Administered 2015-10-28: 500 mg via ORAL
  Filled 2015-10-28: qty 1

## 2015-10-28 MED ORDER — MORPHINE SULFATE (PF) 2 MG/ML IV SOLN: 2.0000 mg | INTRAVENOUS | 0 refills | Status: AC | PRN

## 2015-10-28 MED ORDER — FUROSEMIDE 10 MG/ML IJ SOLN: 80.0000 mg | Freq: Two times a day (BID) | INTRAMUSCULAR | 0 refills | Status: AC

## 2015-10-28 NOTE — Plan of Care (Signed)
Problem: Education: Goal: Knowledge of [place] [facility] Education information/materials will improve Outcome: Progressing Patient has complained of pain this shift, PRN IV Morphine administered per order, at reassessment patient sleeping (see MAR and flowsheets for detailed information).  RN has provided medication education to patient on all medications administered thus far this shift.

## 2015-10-28 NOTE — Clinical Social Work Note (Signed)
Clinical Social Worker received a consult from RN, stating patient family is requesting placement at [facility].  CSW made referral and bed available for today.  Clinical Social Worker facilitated patient discharge including contacting patient family and facility to confirm patient discharge plans.  Clinical information faxed to facility and family agreeable with plan.  CSW arranged ambulance transport via [facility] to [facility].  RN to call report prior to discharge.  Clinical Social Worker will sign off for now as social work intervention is no longer needed. Please consult [place] again if new need arises.  Barbette Or, [place]

## 2015-10-28 NOTE — Progress Notes (Signed)
Subjective: Patient is increasingly lethargic and unable to respond to questions. [name] son and sister, POMA, are present in the room. We discussed expectations and planning for end of life care as outlined below.  Objective:  Vital signs in last 24 hours: Vitals:   10/28/15 0341 10/28/15 0450 10/28/15 0710 10/28/15 0817  BP: (!) 116/53 (!) 112/53 (!) 105/52   Pulse: 86   83  Resp: 20   18  Temp: 98.3 F (36.8 C)   98.1 F (36.7 C)  TempSrc: Axillary   Oral  SpO2: 95%  95% 97%  Weight: 61.3 kg (135 lb 3.2 oz)     Height:       Constitutional: more responsive, NAD, slow to respond CV: RRR, systolic murmur on LLSB, no rubs or gallops, no carotid bruits, +JVD Resp: coarse crackles appreciated bilaterally R>L, labored breathing with use of accessory muscles Abd: distended, soft, +BS, nontender Ext: warm, dry, 1+ lower extremity edema bilaterally, no clubbing; 2+ pulses throughout  Assessment/Plan:  Active Problems:   Essential hypertension   Anemia, iron deficiency   AVM (arteriovenous malformation) of stomach, acquired   Acute on chronic heart failure (HCC)   Cardiorenal syndrome with renal failure   Circulatory collapse (HCC)   Acute on chronic systolic congestive heart failure (HCC)   Encounter for palliative care   Goals of care, counseling/discussion   Acute on chronic kidney failure (HCC)  Acute on chronic diastolic heart failure: Patient has history of diastolic heart failure 2/2 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 Patient presents with increasing lethargy and shortness of breath since discharge on 8/2 for same reason, when she was treated with milrinone and discharged on torsemide 80/60 BID and metazolone 2.66m twice weekly. Her family maintains compliance with medicines. Previous admission ECHO 10/10/2015 shows worsening pulmonary edema, grade 2 diastolic heart failure, and severe TR. Pulmonary arterial hypertension last evaluated with [facility] in 1/16 and was severe.  BMP 798.9, lactate negative, troponins negative x2, EKG shows 1st degree AV block, VBG respiratory alkalosis. Patient has been receiving IV lasix 879mBID, and received one dose of metolazone 2.5 yesterday, without an expected diuresis; patient is increasingly dyspneic, with increasing abdominal distention. --morphine 16m34m3hrs as needed --diurese with lasix 42m10mV BID --discontinue milrinone dosed by weight, selexipag 1600mc66mD, Ambrisentan 10mg 59my, Riociguat 2.5mg q840m --d/c PICC for co-ox and CVP, CVP 9 today, CO-ox 90.5%  Goals of Care: At family meeting on 10/26/15, both patient and family agreed (including POMA) that her code status is DNR/DNI and that her wish is to go home on hospice. Palliative care saw her as well, and reached the same conclusion in conversation with the patient. To have home hospice, she still needs round-the-clock support from family when hospice nurse is not there. We discussed this with the family, including POMA, who are not able to provide the support for home hospice. On evaluating patient today, it appears that patient is entering final stages of dying; patient's POMA and son agreed that patient should be kept comfortable and we would discontinue all meds except diuretics and morphine. Family wishes to pursue inpatient hospice at [facility]: Patient has history of PAF managed on amiodarone. Currently in sinus rhythm --d/c amiodarone  Acute on Chronic CKD, stage III: Patient has history of CKD stage 3, and presented on admission with Cr of 3.45 (with baseline of 1.5-2), BUN 106, and GFR 15. AKI on account of worsening heart failure. Cr to 1.92 this morning improved  with milrinone and Lasix.  --Lasix 47m bid  HypoK/hypoMg: Patient had hypomagnesemia on admission and worsening hypokalemia upon diuresing. Her Mg and K were replaced.  Dispo: Anticipated discharge in approximately 1-2 day(s).   GAlphonzo Grieve MD 10/28/2015, 8:58 AM Pager: [phone]

## 2015-10-28 NOTE — Progress Notes (Signed)
Report called to Arbie Cookey, RN at [facility].

## 2015-10-28 NOTE — Care Management Important Message (Signed)
Important Message  Patient Details  Name: [name] MRN: [id number] Date of Birth: 1950-08-15   Medicare Important Message Given:  Yes    Nathen May 10/28/2015, 10:44 AM

## 2015-10-28 NOTE — Progress Notes (Signed)
Patient ID: [name], female   DOB: Jul 24, 1950, 65 y.o.   MRN: [id number]    Advanced Heart Failure Rounding Note   Subjective:    Admitted 8/11 for recurrent RHF and AKI. Started on milrinone 0.25.    Remains sedated. Received morphine earlier this morning.     Objective:   Weight Range:  Vital Signs:   Temp:  [97.8 F (36.6 C)-99 F (37.2 C)] 98.3 F (36.8 C) (08/15 0341) Pulse Rate:  [81-86] 86 (08/15 0341) Resp:  [15-24] 20 (08/15 0341) BP: (106-118)/(52-60) 112/53 (08/15 0450) SpO2:  [92 %-100 %] 95 % (08/15 0341) Weight:  [135 lb 3.2 oz (61.3 kg)] 135 lb 3.2 oz (61.3 kg) (08/15 0341) Last BM Date: 10/26/15  Weight change: Filed Weights   10/26/15 0423 10/27/15 0427 10/28/15 0341  Weight: 145 lb 3.2 oz (65.9 kg) 122 lb 14.4 oz (55.7 kg) 135 lb 3.2 oz (61.3 kg)    Intake/Output:   Intake/Output Summary (Last 24 hours) at 10/28/15 0740 Last data filed at 10/28/15 0625  Gross per 24 hour  Intake             1080 ml  Output             2575 ml  Net            -1495 ml     Physical Exam CVP 14.  General: Lethargive . Son and sister at bedside.  Psych: Normal affect. Neuro: Opens eyes   Moves all extremities spontaneously. HEENT: Normal                     Neck: Supple without bruits. JVP to ear cm with prominent CV waves. Lungs: Dyspneic at rest and worse with speaking.  Heart: RRR + s3,  RV lift s4, 2/6 HSM LLSB.  Abdomen: Soft, NT, distened, no HSM. No bruits or masses. +BS  Extremities: No clubbing, cyanosis or 2+ edema. DP/PT/Radials 2+ and equal bilaterally. + mild asterixis    Labs: Basic Metabolic Panel:  Recent Labs Lab 10/24/15 1630 10/25/15 0518 10/25/15 1700 10/26/15 0530 10/27/15 0458 10/28/15 0450  NA  --  134* 134* 135 137 138  K  --  3.1* 3.2* 3.6 4.4 4.0  CL  --  98* 99* 99* 104 103  CO2  --  21* _0 GLUCOSE  --  104* 113* 89 79 87  BUN  --  100* 94* 86* 73* 69*  CREATININE  --  2.90* 2.49* 2.25* 1.93* 1.92*    CALCIUM  --  8.3* 8.5* 8.6* 8.8* 8.7*  MG 1.4* 1.5* 1.9  --   --   --     Liver Function Tests:  Recent Labs Lab 10/25/15 0518  AST 24  ALT 10*  ALKPHOS 109  BILITOT 1.1  PROT 7.0  ALBUMIN 2.4*   No results for input(s): LIPASE, AMYLASE in the last 168 hours. No results for input(s): AMMONIA in the last 168 hours.  CBC:  Recent Labs Lab 10/24/15 1210 10/25/15 0518 10/26/15 0530 10/27/15 0458  WBC 8.3 8.3 9.1 9.2  NEUTROABS  --  6.6  --   --   HGB 8.8* 8.3* 8.0* 8.6*  HCT 26.5* 25.3* 24.0* 25.9*  MCV 75.9* 74.0* 75.5* 75.3*  PLT 321 352 304 325    Cardiac Enzymes:  Recent Labs Lab 10/24/15 1630  TROPONINI <0.03    BNP: BNP (last 3 results)  Recent Labs  07/29/15 1304  10/03/15 1435 10/24/15 1629  BNP 589.8* 637.4* 796.9*    ProBNP (last 3 results) No results for input(s): PROBNP in the last 8760 hours.    Other results:  Imaging: No results found.   Medications:     Scheduled Medications: . ambrisentan  10 mg Oral Daily  . amiodarone  200 mg Oral Daily  . enoxaparin (LOVENOX) injection  30 mg Subcutaneous QHS  . FLUoxetine  20 mg Oral q morning - 10a  . furosemide  80 mg Intravenous BID  . levothyroxine  25 mcg Oral QAC breakfast  . pantoprazole  40 mg Oral Daily  . potassium chloride  40 mEq Oral Daily  . Riociguat  2.5 mg Oral Q8H  . Selexipag  1,600 mcg Oral BID    Infusions: . milrinone 0.25 mcg/kg/min (10/27/15 2339)    PRN Medications:     Assessment/Plan:  [name] is a 65 y.o. female with PAH and RHF failure, CKD stage III, PAF, and severe TR. Recently admitted with recurrent right heart failure and diuresed, has been readmitted this time after < 2 wks with recurrent RV failure.   1. 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 Echo 7/17 EF 55-60% with moderate dilated and mildly dysfunctional RV, severe TR, PASP 91 mmHg.  - End Stage PAH with  RV failure. Family and patient have  discussed Hospice with Palliative care. She is DNR/DNI.  Family ok with move to [facility] however I am not sure she would survive ambulance ride.  They understand she has days to live. Agreeable to move to [place].  2. PAH with cor pulmonale: Severe PAH. Had last [place] in 1/16. Pulmonary pressure remained elevated by last echo. Stop all PAH meds.  -3. CKD stage III: Admitted with AKI, suspect cardiorenal.  4. 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 AVMs noted 01/2015. AVMs clipped in 4/17. She is no longer anticoagulated.  No overt bleeding.  5. Pericardial effusion: Resolved after pericardiocentesis, not seen on echo 12/2014. Repeat echo 7/17 with trivial effusion.  6. Atrial fibrillation: Paroxysmal, she is in NSR.  - Continue amiodarone.  - No anticoagulation with h/o recurrent GI bleeding.  7. Hypothyroidism: Stop all medications.   8. Severe tricuspid regurgitation  Stop all non essential medications. Family ok with move to [place]. Stop milrinone and PAH meds. Can continue IV lasix and morphine as needed.   Discussed with Dr Aundra Dubin and Dr Jari Favre.    Length of Stay: [place], NP-C  10/28/2015, 7:40 AM  Advanced Heart Failure Team Pager [phone] (M-F; 7a - 4p)  Please contact [facility] for night-coverage after hours (4p -7a ) and weekends on [email or web address]  Patient seen with NP, agree with the above plan.  We are moving to full comfort care.  Will continue diuretics but stop other meds and can stop blood draws, vitals.  I suspect that she will expire in the hospital.   [name] 10/28/2015 8:59 AM

## 2015-10-28 NOTE — Progress Notes (Addendum)
HF stopped Milrinone this am. Called [facility] to let them know it was stopped, as this was the barrier to going to [facility]. Plan f/u later today.  Marjie Skiff Kirby Cortese, RN, BSN, [facility] 10/28/2015 8:33 AM Cell [phone] 8:00-4:00 Monday-Friday Office [phone]     0900 ADDENDUM: Rec'd call from resident who is seeing patient. She states that primary team feels pt is not stable for transport and may die within the next few days. If pt survives, they will request transfer to inpt hospice at that time (possibly [facility]). Resident asked that pt be left on the waiting list for inpt hospice in preparation for transfer later this week; explained that this is not likely to happen, as beds are not usually held in this situation.   PMT will f/u tomorrow.  Marjie Skiff Lorianne Malbrough, RN, BSN, [facility] 10/28/2015 9:08 AM Cell [phone] 8:00-4:00 Monday-Friday Office [phone]      0920 ADDENDUM: Rec'd call back from resident. Primary team and family would like pt referred and transferred to [facility], as that is closest to home. Explained that pt had been active with [place], but per MD, family wants [facility]. MD is putting in d/c orders and note. I will confirm that hospice rx is in.  Marjie Skiff Bana Borgmeyer, RN, BSN, [facility] 10/28/2015 9:22 AM Cell [phone] 8:00-4:00 Monday-Friday Office [phone]

## 2015-10-29 NOTE — Telephone Encounter (Signed)
LVM to return call.

## 2015-10-29 NOTE — Telephone Encounter (Signed)
Spoke with [name] who said that she saw where patient was admitted. No further needs from [place].

## 2015-10-31 ENCOUNTER — Telehealth (HOSPITAL_COMMUNITY): Payer: Self-pay

## 2015-10-31 NOTE — Telephone Encounter (Signed)
[name] son made aware of FMLA completion for himself as primary caregiver of patient. 12 pages total including forms and supporting documentation faxed to [facility] Team at provided # [phone]. Mr. Blacksher made aware these were completed and faxed on his behalf. Copy of forms also scanned into electronic medical record.  Renee Pain, RN

## 2015-11-05 ENCOUNTER — Telehealth (HOSPITAL_COMMUNITY): Payer: Self-pay

## 2015-11-05 NOTE — Telephone Encounter (Signed)
[facility] calling to get PA or update with patient regarding [facility]. Informed that patient is now hospice and no longer on [facility].  Renee Pain, RN

## 2015-11-19 ENCOUNTER — Ambulatory Visit: Payer: Medicare Other | Admitting: Emergency Medicine

## 2015-11-21 ENCOUNTER — Telehealth (HOSPITAL_COMMUNITY): Payer: Self-pay | Admitting: *Deleted

## 2015-11-21 NOTE — Telephone Encounter (Signed)
[facility] called and states they were unable to reach pt for her [name] reorder and request a new phone number to contact pt.  Advised pt was now in hospice care and [name] has been d/c'd, they will make note in her chart

## 2015-12-03 ENCOUNTER — Telehealth: Payer: Self-pay | Admitting: Cardiology

## 2015-12-03 NOTE — Telephone Encounter (Signed)
That would be fine.

## 2015-12-03 NOTE — Telephone Encounter (Signed)
New Message  Gareth Eagle from hospice voiced pt is being released to go home and wondering MD-Mc[place] can be the attending physician.  Please f/u

## 2015-12-04 NOTE — Telephone Encounter (Signed)
Spoke w/[place], let her know Aundra Dubin will be attending for pt, she states pt is going home on Mon 9/25

## 2015-12-04 NOTE — Telephone Encounter (Signed)
Pt is followed in [facility].

## 2015-12-08 DIAGNOSIS — R531 Weakness: Secondary | ICD-10-CM | POA: Diagnosis not present

## 2016-04-20 ENCOUNTER — Telehealth (HOSPITAL_COMMUNITY): Payer: Self-pay | Admitting: *Deleted

## 2016-04-20 NOTE — Telephone Encounter (Signed)
Trenda Moots, RN with [facility] called to let Dr. Aundra Dubin know they were transitioning patient from hospice to [facility].  She just wanted him to be aware.  I'm routing this message to Dr. Aundra Dubin.

## 2016-04-23 ENCOUNTER — Encounter (HOSPITAL_COMMUNITY): Payer: Self-pay | Admitting: *Deleted

## 2016-04-23 NOTE — Progress Notes (Signed)
DC completed and signed by Dr Aundra Dubin, [place] aware to p/u

## 2016-05-03 ENCOUNTER — Telehealth: Payer: Self-pay | Admitting: Neurology

## 2016-05-03 NOTE — Telephone Encounter (Signed)
Pt's twin sister [name] called to advise the pt died 24-Apr-2016 from her disease

## 2016-05-13 DEATH — deceased

## 2016-06-25 ENCOUNTER — Other Ambulatory Visit: Payer: Self-pay | Admitting: Nurse Practitioner

## 2016-10-27 IMAGING — CT CT ABD-PELV W/O CM
2 of 4 series · 16 of 46 positions shown, 18 images · non-contrast
Comparison: CT scan dated 02/20/2015

CLINICAL DATA: GI bleed.

EXAM:
CT ABDOMEN AND PELVIS WITHOUT CONTRAST
TECHNIQUE: Multidetector CT imaging of the abdomen and pelvis was performed
following the standard protocol without IV contrast.

[Series 2: abd/ pelvis 5.0 i30f 1 · axial · 0.67mm/px · z∈[-529,-89]mm · 13 of 97 slices shown, 15 images]
[im 5/97  soft-tissue]
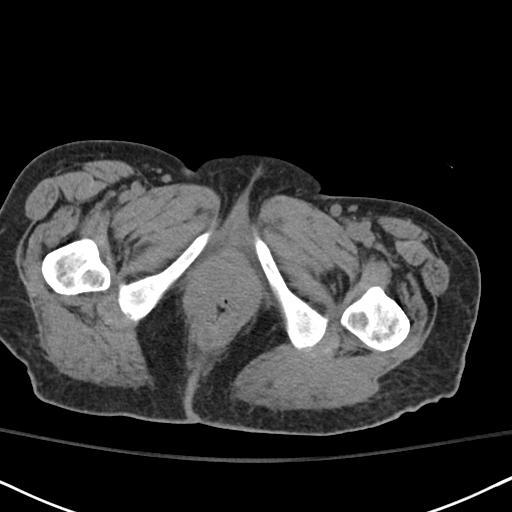
[im 5/97  bone]
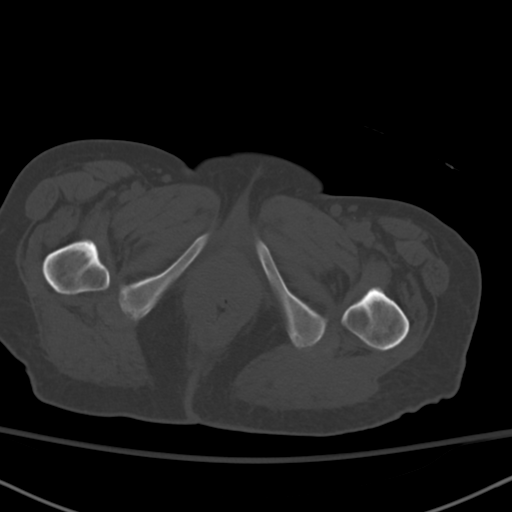
[im 13/97  soft-tissue]
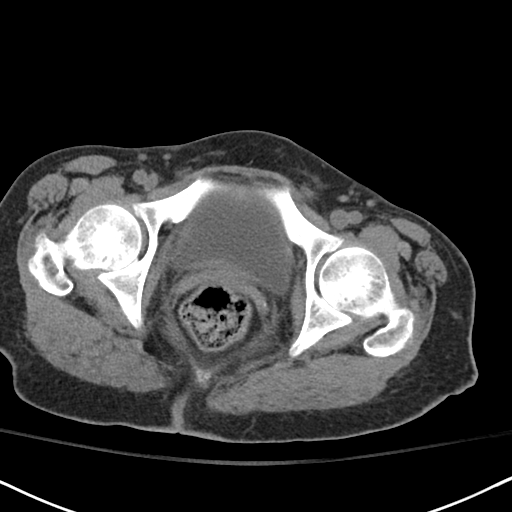
[im 21/97  soft-tissue]
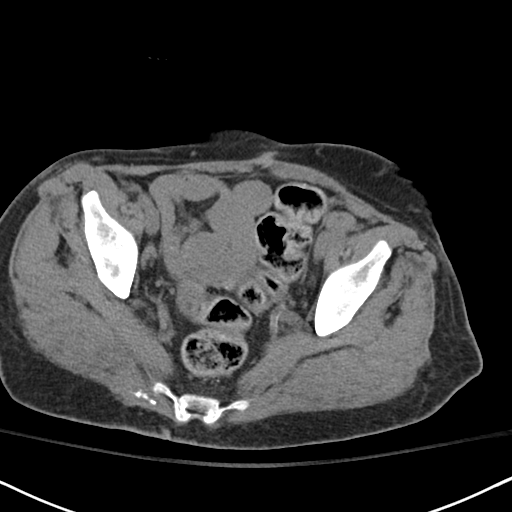
[im 29/97  soft-tissue]
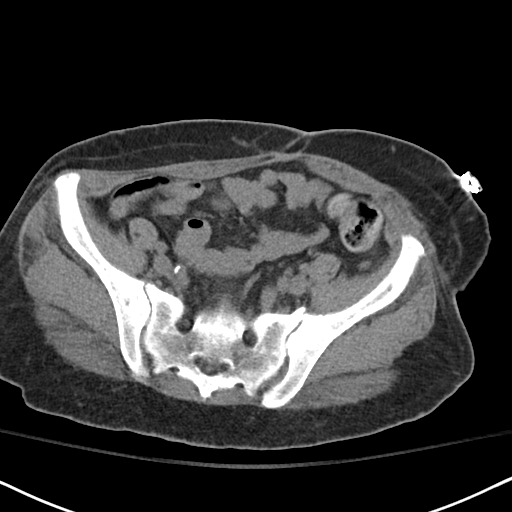
[im 33/97  soft-tissue]
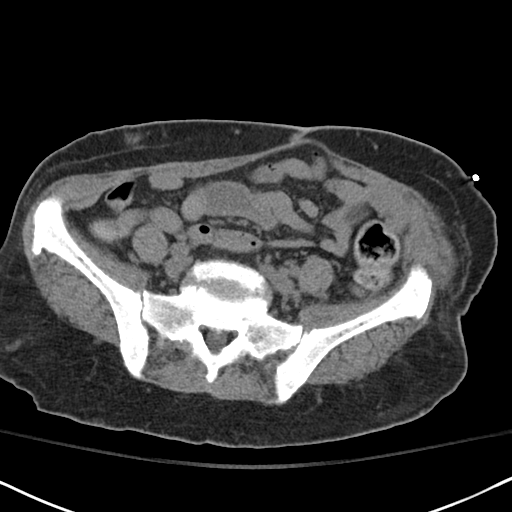
[im 41/97  soft-tissue]
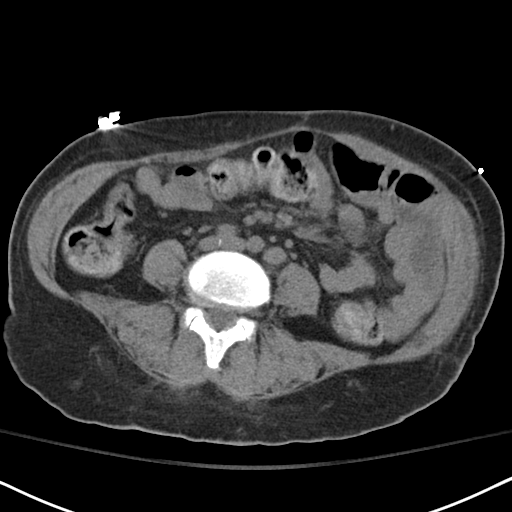
[im 49/97  soft-tissue]
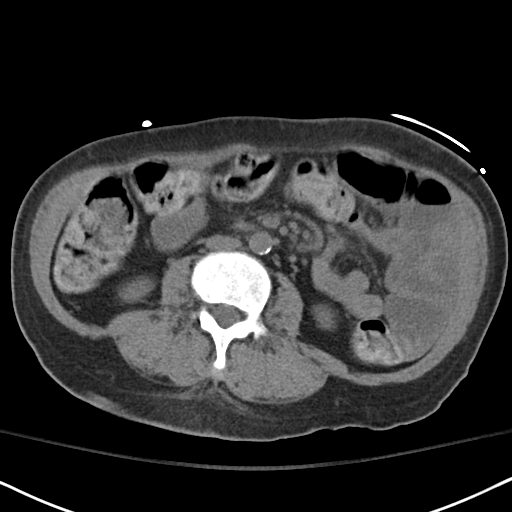
[im 57/97  soft-tissue]
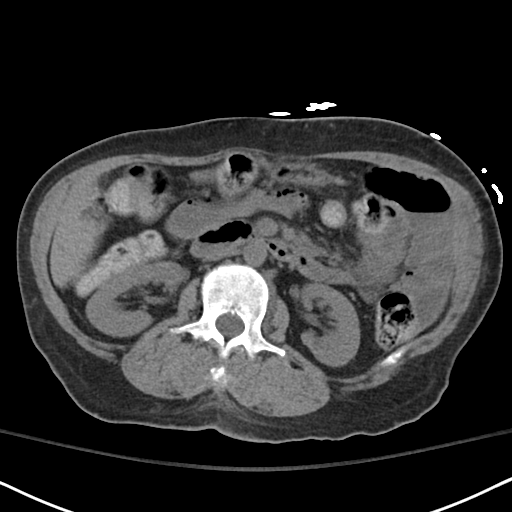
[im 65/97  soft-tissue]
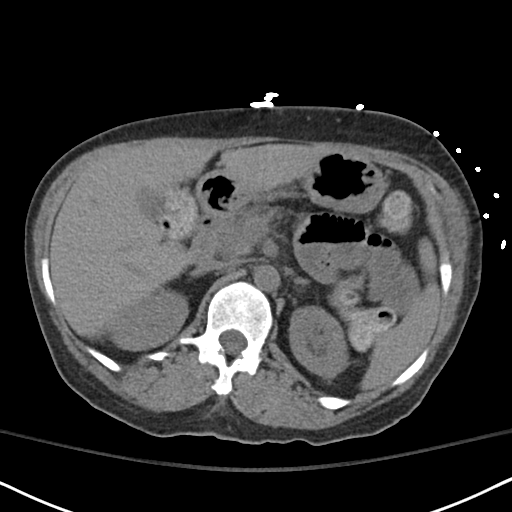
[im 65/97  bone]
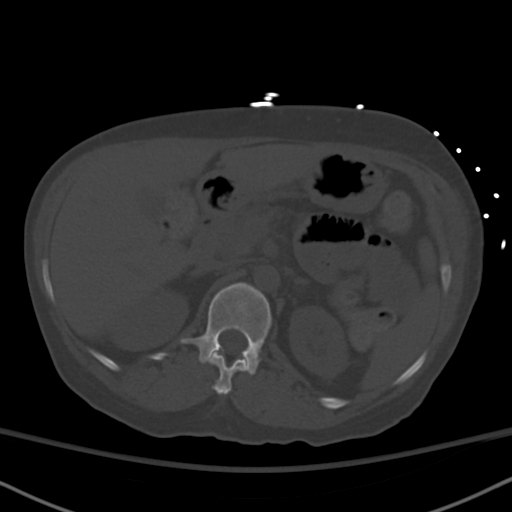
[im 69/97  soft-tissue]
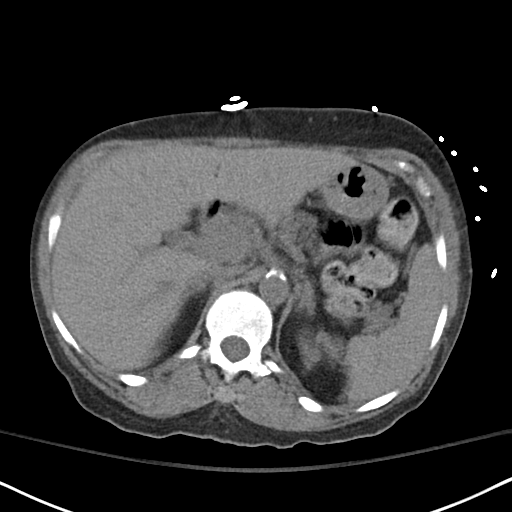
[im 77/97  soft-tissue]
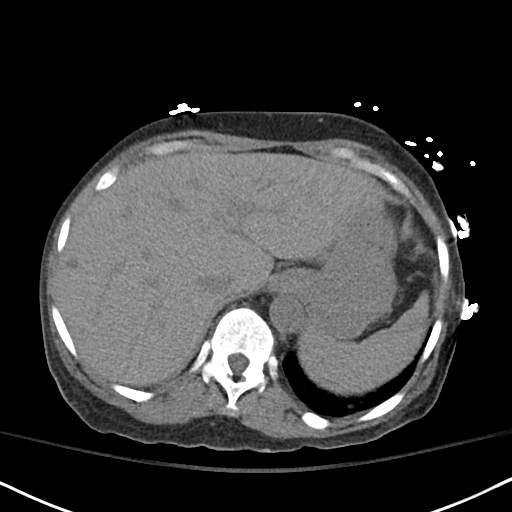
[im 85/97  soft-tissue]
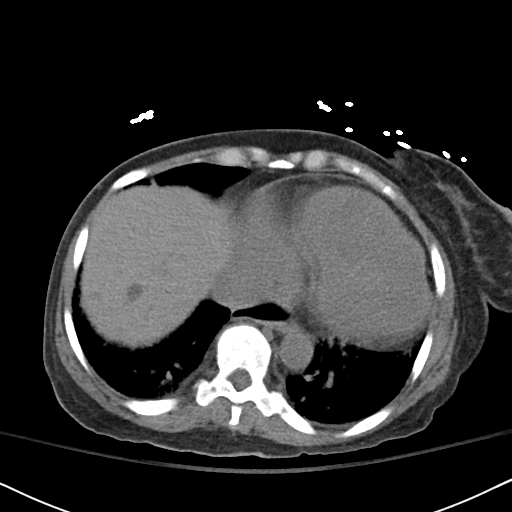
[im 93/97  soft-tissue]
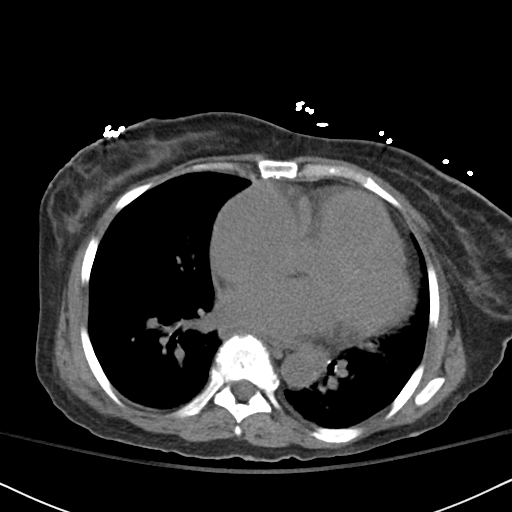

[Series 5: cor st · coronal · 0.77mm/px · 3 of 88 slices shown]
[im 30/88  soft-tissue]
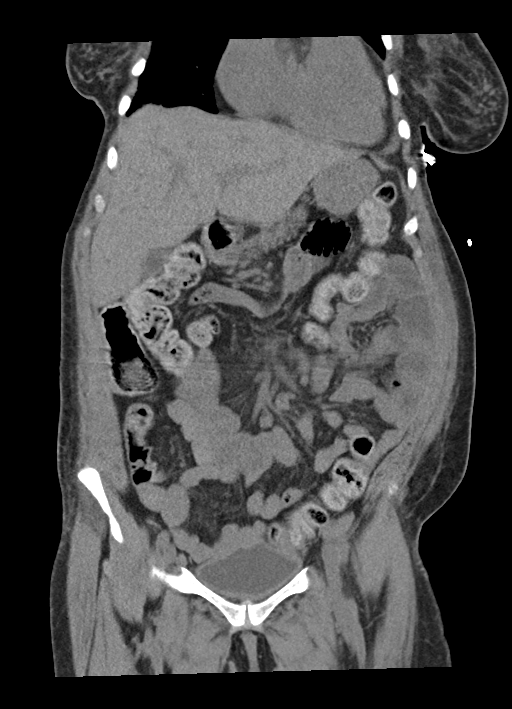
[im 39/88  soft-tissue]
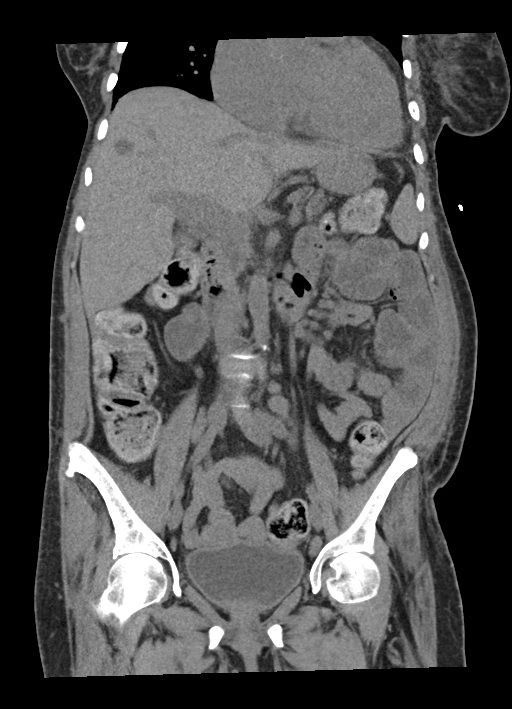
[im 49/88  soft-tissue]
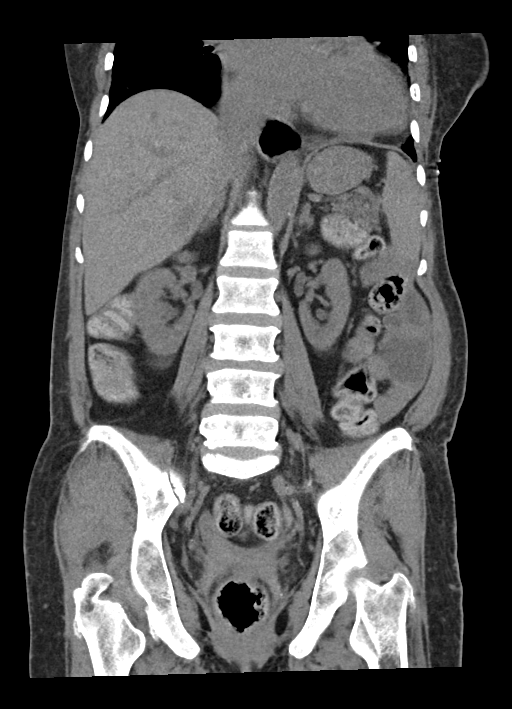

[16 of 46 positions shown; findings below may reference images not displayed]

FINDINGS: Lower chest: Chronic cardiomegaly. Moderate hiatal hernia with
dilated distal esophagus.

Hepatobiliary: Multiple benign appearing cysts in the right lobe of
the liver, the largest being 17 mm, all unchanged. Liver parenchyma
and biliary tree are otherwise normal.

Pancreas: Normal.

Spleen: Normal.

Adrenals/Urinary Tract: Normal.

Stomach/Bowel: Moderate hiatal hernia with dilated distal esophagus.
The colon appears normal. Minimally prominent small bowel loop in
the left mid abdomen. No masses.

Vascular/Lymphatic: Scattered calcifications in the abdominal aorta.
No adenopathy.

Reproductive: Uterus has been removed.  Ovaries are not identified.

Other: No free air or free fluid.

Musculoskeletal: No significant abnormality.
IMPRESSION: 1. Moderate hiatal hernia with dilated distal esophagus.
2. Nonspecific slight prominence of a proximal small bowel loop in
the left mid abdomen.
3. No other significant abnormality.

## 2017-03-27 IMAGING — CR DG CHEST 1V PORT
1 series · 1 of 1 positions shown · non-contrast
Comparison: 08/26/2015

CLINICAL DATA: Diabetes, CHF

EXAM:
PORTABLE CHEST 1 VIEW

[AP]
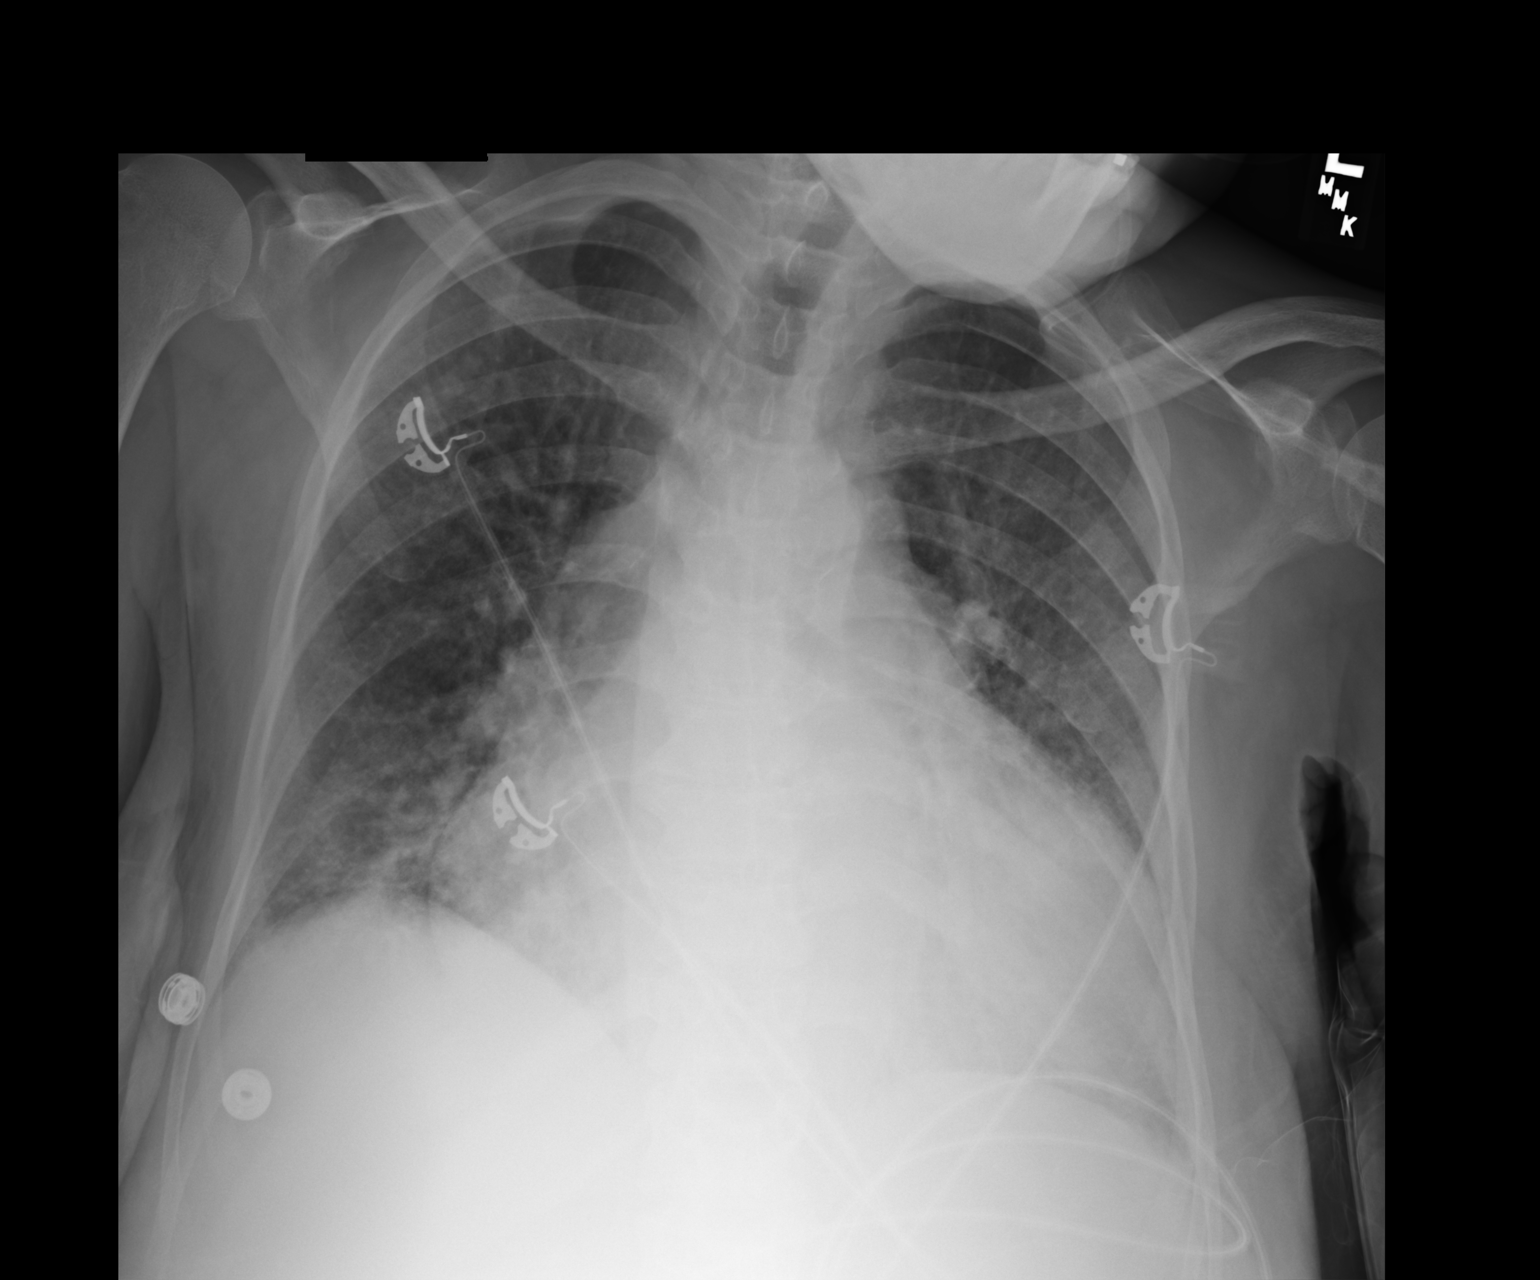

[1 of 1 positions shown; findings below may reference images not displayed]

FINDINGS: Stable cardiomegaly with slight vascular and interstitial prominence
suggesting mild edema. Minor basilar atelectasis. No large effusion
or pneumothorax. Trachea is midline. Overall stable exam.
IMPRESSION: Stable cardiomegaly with vascular congestion versus early edema.

Basilar atelectasis.

## 2017-04-11 IMAGING — CR DG CHEST 1V PORT
1 series · 1 of 1 positions shown · non-contrast
Comparison: 10/24/2015

CLINICAL DATA: Status post PICC placement

EXAM:
PORTABLE CHEST 1 VIEW

[AP]
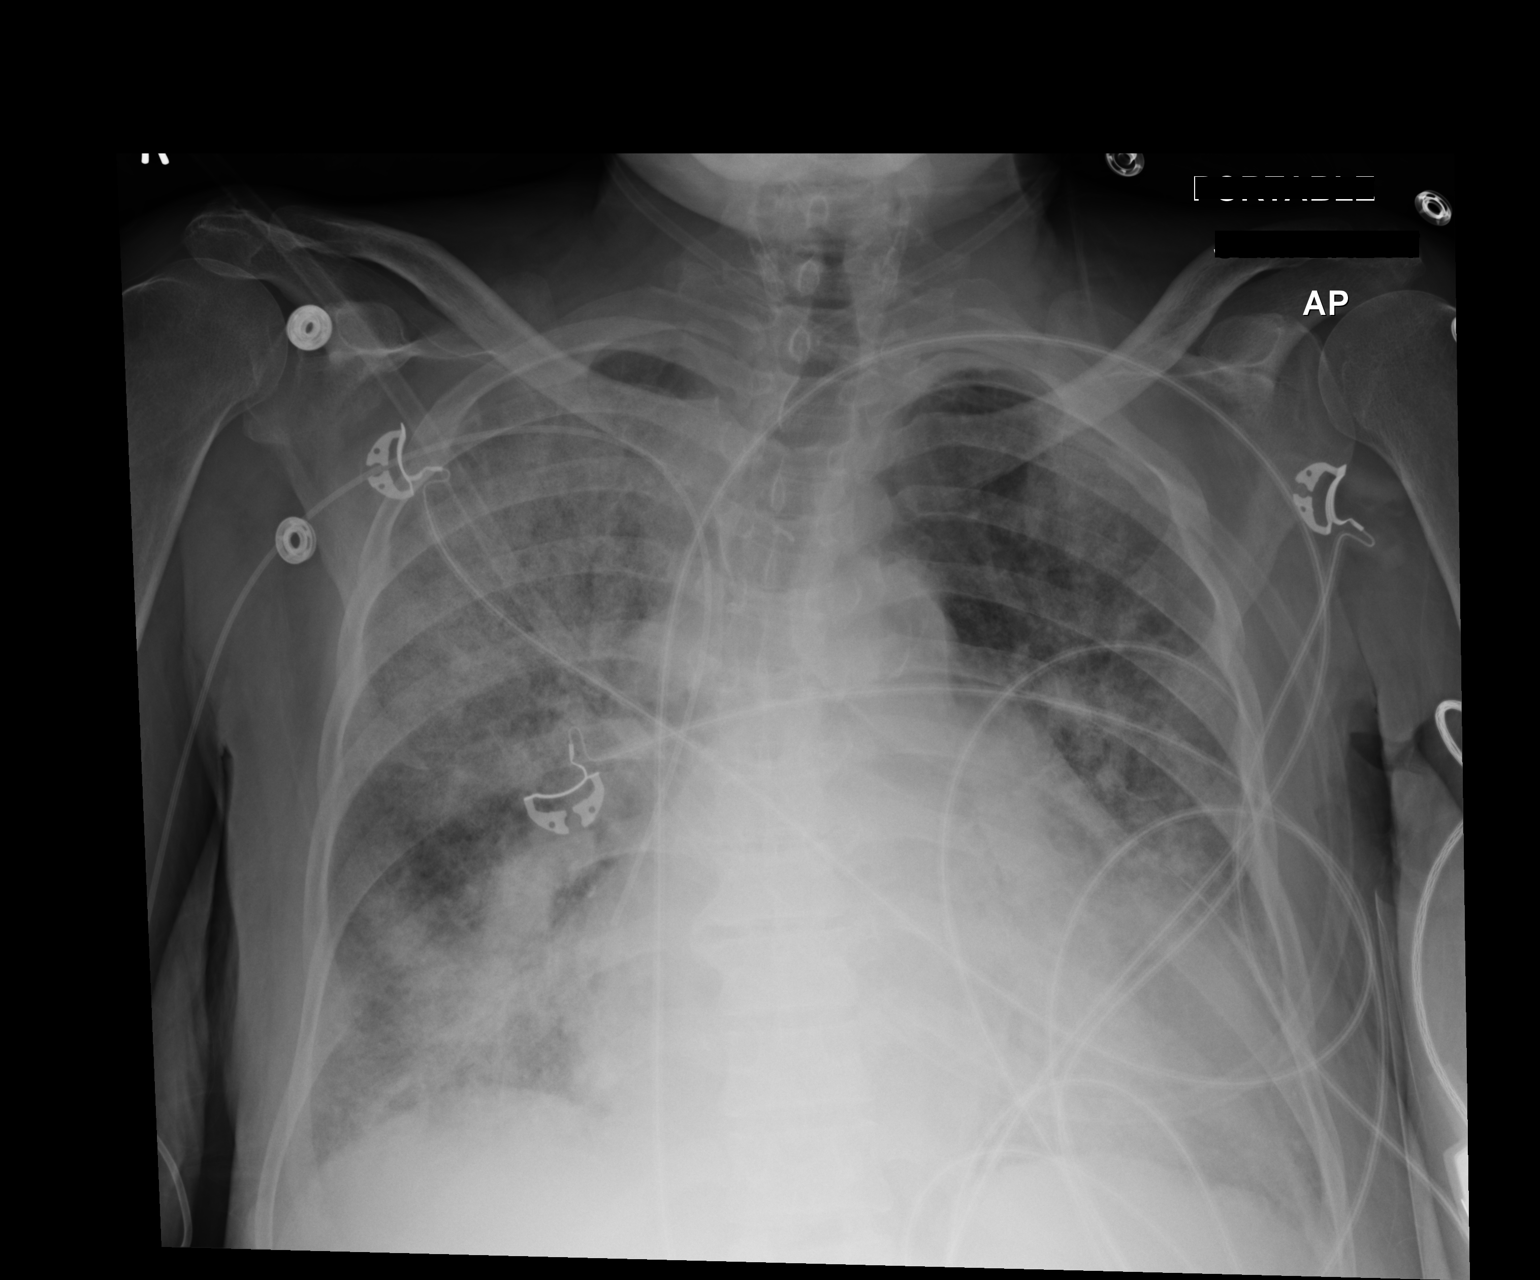

[1 of 1 positions shown; findings below may reference images not displayed]

FINDINGS: Right PICC line tip projects at the caval atrial junction.

Hazy airspace opacity, right lung greater than left, similar to the
prior study, likely asymmetric edema. Cardiac silhouette is mildly
enlarged.

No definite pleural effusion.  No pneumothorax.
IMPRESSION: 1. Right PICC is well positioned with its tip at the caval atrial
junction.
2. No other change from the prior study. Persistent right greater
than left airspace lung opacities likely asymmetric pulmonary edema
from congestive heart failure.
